# Patient Record
Sex: Female | Born: 1940 | Race: White | Hispanic: No | Marital: Married | Smoking: Former smoker
Health system: Southern US, Community
[De-identification: ages and names within clinical notes are randomized; demographics above are authoritative.]

## PROBLEM LIST (undated history)

## (undated) DIAGNOSIS — E7800 Pure hypercholesterolemia, unspecified: Secondary | ICD-10-CM

## (undated) DIAGNOSIS — Z720 Tobacco use: Secondary | ICD-10-CM

## (undated) DIAGNOSIS — C801 Malignant (primary) neoplasm, unspecified: Secondary | ICD-10-CM

## (undated) DIAGNOSIS — I2510 Atherosclerotic heart disease of native coronary artery without angina pectoris: Secondary | ICD-10-CM

## (undated) DIAGNOSIS — I714 Abdominal aortic aneurysm, without rupture, unspecified: Secondary | ICD-10-CM

## (undated) DIAGNOSIS — E039 Hypothyroidism, unspecified: Secondary | ICD-10-CM

## (undated) DIAGNOSIS — I739 Peripheral vascular disease, unspecified: Secondary | ICD-10-CM

## (undated) DIAGNOSIS — D649 Anemia, unspecified: Secondary | ICD-10-CM

## (undated) DIAGNOSIS — K219 Gastro-esophageal reflux disease without esophagitis: Secondary | ICD-10-CM

## (undated) DIAGNOSIS — C189 Malignant neoplasm of colon, unspecified: Secondary | ICD-10-CM

## (undated) DIAGNOSIS — C44311 Basal cell carcinoma of skin of nose: Secondary | ICD-10-CM

## (undated) DIAGNOSIS — I10 Essential (primary) hypertension: Secondary | ICD-10-CM

## (undated) DIAGNOSIS — M1990 Unspecified osteoarthritis, unspecified site: Secondary | ICD-10-CM

## (undated) DIAGNOSIS — R42 Dizziness and giddiness: Secondary | ICD-10-CM

## (undated) DIAGNOSIS — I252 Old myocardial infarction: Secondary | ICD-10-CM

## (undated) HISTORY — DX: Abdominal aortic aneurysm, without rupture: I71.4

## (undated) HISTORY — DX: Peripheral vascular disease, unspecified: I73.9

## (undated) HISTORY — PX: CORONARY STENT PLACEMENT: SHX1402

## (undated) HISTORY — PX: TONSILLECTOMY: SUR1361

## (undated) HISTORY — DX: Abdominal aortic aneurysm, without rupture, unspecified: I71.40

## (undated) HISTORY — PX: TUBAL LIGATION: SHX77

## (undated) HISTORY — PX: CARDIAC CATHETERIZATION: SHX172

## (undated) HISTORY — PX: EYE SURGERY: SHX253

## (undated) HISTORY — DX: Malignant neoplasm of colon, unspecified: C18.9

---

## 1998-11-23 DIAGNOSIS — I252 Old myocardial infarction: Secondary | ICD-10-CM

## 1998-11-23 HISTORY — DX: Old myocardial infarction: I25.2

## 1999-06-20 ENCOUNTER — Ambulatory Visit (HOSPITAL_COMMUNITY): Admission: RE | Admit: 1999-06-20 | Discharge: 1999-06-20 | Payer: Self-pay | Admitting: Cardiology

## 2000-03-13 ENCOUNTER — Encounter: Payer: Self-pay | Admitting: Emergency Medicine

## 2000-03-14 ENCOUNTER — Inpatient Hospital Stay (HOSPITAL_COMMUNITY): Admission: EM | Admit: 2000-03-14 | Discharge: 2000-03-16 | Payer: Self-pay | Admitting: Emergency Medicine

## 2000-08-18 ENCOUNTER — Ambulatory Visit (HOSPITAL_COMMUNITY): Admission: RE | Admit: 2000-08-18 | Discharge: 2000-08-19 | Payer: Self-pay | Admitting: Cardiology

## 2008-11-23 DIAGNOSIS — C44311 Basal cell carcinoma of skin of nose: Secondary | ICD-10-CM

## 2008-11-23 HISTORY — DX: Basal cell carcinoma of skin of nose: C44.311

## 2013-10-30 ENCOUNTER — Telehealth: Payer: Self-pay

## 2013-11-01 ENCOUNTER — Ambulatory Visit: Payer: Self-pay | Admitting: Cardiology

## 2013-11-02 ENCOUNTER — Other Ambulatory Visit: Payer: Self-pay | Admitting: Cardiology

## 2013-11-02 MED ORDER — METOPROLOL TARTRATE 25 MG PO TABS: 25.0000 mg | ORAL_TABLET | Freq: Two times a day (BID) | ORAL | Status: DC

## 2013-11-02 NOTE — Telephone Encounter (Signed)
Rx sent to pharmacy   

## 2013-11-08 ENCOUNTER — Ambulatory Visit: Payer: Medicare Other | Admitting: Cardiology

## 2013-11-21 ENCOUNTER — Telehealth: Payer: Self-pay

## 2013-11-22 NOTE — Telephone Encounter (Signed)
Pt needs an appt with Dr Marlou Porch before any refills pt has cancelled last two appts and is overdue

## 2014-03-07 ENCOUNTER — Telehealth: Payer: Self-pay

## 2014-03-07 MED ORDER — ROSUVASTATIN CALCIUM 20 MG PO TABS: 20.0000 mg | ORAL_TABLET | Freq: Every day | ORAL | Status: DC

## 2014-03-07 NOTE — Telephone Encounter (Signed)
Refilled

## 2014-03-07 NOTE — Addendum Note (Signed)
Addended byUlla Potash H on: 03/07/2014 11:34 AM   Modules accepted: Orders

## 2014-03-29 ENCOUNTER — Other Ambulatory Visit: Payer: Self-pay | Admitting: Cardiology

## 2014-03-29 MED ORDER — ESOMEPRAZOLE MAGNESIUM 40 MG PO CPDR: 40.0000 mg | DELAYED_RELEASE_CAPSULE | Freq: Every day | ORAL | Status: DC

## 2014-03-29 NOTE — Telephone Encounter (Signed)
Rx Refill Nexium

## 2014-04-06 ENCOUNTER — Encounter (HOSPITAL_COMMUNITY): Payer: Self-pay | Admitting: Emergency Medicine

## 2014-04-06 ENCOUNTER — Emergency Department (HOSPITAL_COMMUNITY)
Admission: EM | Admit: 2014-04-06 | Discharge: 2014-04-07 | Disposition: A | Discharge status: Home / Self Care | Payer: Medicare Other | Attending: Emergency Medicine | Admitting: Emergency Medicine

## 2014-04-06 ENCOUNTER — Emergency Department (HOSPITAL_COMMUNITY): Payer: Medicare Other

## 2014-04-06 DIAGNOSIS — Z792 Long term (current) use of antibiotics: Secondary | ICD-10-CM | POA: Insufficient documentation

## 2014-04-06 DIAGNOSIS — I10 Essential (primary) hypertension: Secondary | ICD-10-CM | POA: Insufficient documentation

## 2014-04-06 DIAGNOSIS — Z862 Personal history of diseases of the blood and blood-forming organs and certain disorders involving the immune mechanism: Secondary | ICD-10-CM | POA: Insufficient documentation

## 2014-04-06 DIAGNOSIS — Z8639 Personal history of other endocrine, nutritional and metabolic disease: Secondary | ICD-10-CM | POA: Insufficient documentation

## 2014-04-06 DIAGNOSIS — R509 Fever, unspecified: Secondary | ICD-10-CM | POA: Insufficient documentation

## 2014-04-06 DIAGNOSIS — I252 Old myocardial infarction: Secondary | ICD-10-CM | POA: Insufficient documentation

## 2014-04-06 DIAGNOSIS — R5381 Other malaise: Secondary | ICD-10-CM | POA: Insufficient documentation

## 2014-04-06 DIAGNOSIS — K219 Gastro-esophageal reflux disease without esophagitis: Secondary | ICD-10-CM | POA: Insufficient documentation

## 2014-04-06 DIAGNOSIS — Z7982 Long term (current) use of aspirin: Secondary | ICD-10-CM | POA: Insufficient documentation

## 2014-04-06 DIAGNOSIS — Z79899 Other long term (current) drug therapy: Secondary | ICD-10-CM | POA: Insufficient documentation

## 2014-04-06 DIAGNOSIS — Z9861 Coronary angioplasty status: Secondary | ICD-10-CM | POA: Insufficient documentation

## 2014-04-06 DIAGNOSIS — R1310 Dysphagia, unspecified: Secondary | ICD-10-CM | POA: Insufficient documentation

## 2014-04-06 DIAGNOSIS — R5383 Other fatigue: Secondary | ICD-10-CM | POA: Insufficient documentation

## 2014-04-06 HISTORY — DX: Old myocardial infarction: I25.2

## 2014-04-06 HISTORY — DX: Gastro-esophageal reflux disease without esophagitis: K21.9

## 2014-04-06 HISTORY — DX: Essential (primary) hypertension: I10

## 2014-04-06 LAB — BASIC METABOLIC PANEL
BUN: 14 mg/dL (ref 6–23)
CHLORIDE: 104 meq/L (ref 96–112)
CO2: 27 meq/L (ref 19–32)
CREATININE: 1.06 mg/dL (ref 0.50–1.10)
Calcium: 9.7 mg/dL (ref 8.4–10.5)
GFR calc Af Amer: 58 mL/min — ABNORMAL LOW (ref >90)
GFR calc non Af Amer: 50 mL/min — ABNORMAL LOW (ref >90)
GLUCOSE: 111 mg/dL — AB (ref 70–99)
Potassium: 3.7 mEq/L (ref 3.7–5.3)
Sodium: 143 mEq/L (ref 137–147)

## 2014-04-06 LAB — CBC WITH DIFFERENTIAL/PLATELET
Basophils Absolute: 0 10*3/uL (ref 0.0–0.1)
Basophils Relative: 0 % (ref 0–1)
Eosinophils Absolute: 0 10*3/uL (ref 0.0–0.7)
Eosinophils Relative: 0 % (ref 0–5)
HEMATOCRIT: 44.4 % (ref 36.0–46.0)
HEMOGLOBIN: 14.3 g/dL (ref 12.0–15.0)
Lymphocytes Relative: 19 % (ref 12–46)
Lymphs Abs: 1 10*3/uL (ref 0.7–4.0)
MCH: 28.8 pg (ref 26.0–34.0)
MCHC: 32.2 g/dL (ref 30.0–36.0)
MCV: 89.5 fL (ref 78.0–100.0)
MONO ABS: 0.3 10*3/uL (ref 0.1–1.0)
MONOS PCT: 7 % (ref 3–12)
NEUTROS ABS: 3.7 10*3/uL (ref 1.7–7.7)
Neutrophils Relative %: 74 % (ref 43–77)
Platelets: 174 10*3/uL (ref 150–400)
RBC: 4.96 MIL/uL (ref 3.87–5.11)
RDW: 14.6 % (ref 11.5–15.5)
WBC: 5.1 10*3/uL (ref 4.0–10.5)

## 2014-04-06 MED ORDER — MORPHINE SULFATE 4 MG/ML IJ SOLN
4.0000 mg | Freq: Once | INTRAMUSCULAR | Status: DC
  Filled 2014-04-06: qty 1

## 2014-04-06 MED ORDER — SODIUM CHLORIDE 0.9 % IV SOLN
1000.0000 mL | Freq: Once | INTRAVENOUS | Status: AC
  Administered 2014-04-07: 1000 mL via INTRAVENOUS

## 2014-04-06 MED ORDER — ONDANSETRON HCL 4 MG/2ML IJ SOLN
4.0000 mg | Freq: Once | INTRAMUSCULAR | Status: AC
  Administered 2014-04-07: 4 mg via INTRAMUSCULAR
  Filled 2014-04-06: qty 2

## 2014-04-06 NOTE — ED Provider Notes (Signed)
CSN: [id number]     Arrival date & time 04/06/14  [id number] History   First MD Initiated Contact with Patient 04/06/14 2303    This chart was scribed for Sharyon Cable, MD by Terressa Koyanagi, ED Scribe. This patient was seen in room APA10/APA10 and the patient's care was started at 11:05 PM.  Chief Complaint  Patient presents with  . Sore Throat   The history is provided by the patient. No language interpreter was used.   HPI Comments: Jessica Taylor is a 73 y.o. female who presents to the Emergency Department complaining of a sore throat with associated low grade fever and fatigue onset 2 days ago. Pt also reports that she had a root canal 3 days ago and the sore throat started following the root canal. Pt reports she was placed on Amoxicillin following the root canal and has been compliant with the meds. Pt denies chest pain, SOB, weakness/numbness of extremities, abd pain, or swelling of the lower extremities.   Past Medical History  Diagnosis Date  . MI, old   . Hypertension   . Acid reflux   . Thyroid disease    Past Surgical History  Procedure Laterality Date  . Coronary stent placement     No family history on file. History  Substance Use Topics  . Smoking status: Never Smoker   . Smokeless tobacco: Not on file  . Alcohol Use: No   OB History   Grav Para Term Preterm Abortions TAB SAB Ect Mult Living                 Review of Systems  Constitutional: Positive for fever.  HENT: Positive for sore throat.   Respiratory: Negative for shortness of breath.   Cardiovascular: Negative for chest pain and leg swelling.  Gastrointestinal: Negative for abdominal pain.  Neurological: Negative for weakness and numbness.  All other systems reviewed and are negative.     Allergies  Review of patient's allergies indicates no known allergies.  Home Medications   Prior to Admission medications   Medication Sig Start Date End Date Taking? Authorizing Provider  amoxicillin  (AMOXIL) 500 MG capsule Take 500 mg by mouth 4 (four) times daily.   Yes Historical Provider, MD  aspirin EC 81 MG tablet Take 81 mg by mouth daily.   Yes Historical Provider, MD  Boswellia-Glucosamine-Vit D (GLUCOSAMINE COMPLEX PO) Take 1 capsule by mouth daily.   Yes Historical Provider, MD  esomeprazole (NEXIUM) 40 MG capsule Take 1 capsule (40 mg total) by mouth daily at 12 noon. 03/29/14  Yes Candee Furbish, MD  Fish Oil-Cholecalciferol (FISH OIL + D3 PO) Take 1 capsule by mouth daily.   Yes Historical Provider, MD  metoprolol tartrate (LOPRESSOR) 25 MG tablet Take 1 tablet (25 mg total) by mouth 2 (two) times daily. 11/02/13  Yes Candee Furbish, MD  rosuvastatin (CRESTOR) 20 MG tablet Take 1 tablet (20 mg total) by mouth daily. 03/07/14  Yes Candee Furbish, MD   Triage Vitals: BP 150/70  Pulse 101  Temp(Src) 99.9 F (37.7 C) (Oral)  Resp 20  Ht 5\' 4"  (1.626 m)  Wt 155 lb (70.308 kg)  BMI 26.59 kg/m2  SpO2 95% Physical Exam CONSTITUTIONAL: Well developed/well nourished HEAD: Normocephalic/atraumatic EYES: EOMI/PERRL ENMT: Mucous membranes moist, no stridor, no trismus, left lower molar tender to palpation but no abscess. Uvula midline without exudates.  Normal phonation NECK: supple no meningeal signs, cervical lymphadenopathy. SPINE:entire spine nontender CV: S1/S2 noted, no murmurs/rubs/gallops noted  LUNGS: Lungs are clear to auscultation bilaterally, no apparent distress ABDOMEN: soft, nontender, no rebound or guarding GU:no cva tenderness NEURO: Pt is awake/alert, moves all extremitiesx4 EXTREMITIES: pulses normal, full ROM SKIN: warm, color normal PSYCH: no abnormalities of mood noted  ED Course  Procedures DIAGNOSTIC STUDIES: Oxygen Saturation is 95% on RA, adequate by my interpretation.    COORDINATION OF CARE:  11:08 PM-Discussed treatment plan which includes imaging with pt at bedside and pt agreed to plan. Also offered pain meds to pt, pt declined pain meds.   1:14 AM Ct  negative She is in no distress She requests pain meds (will give elixir) and requests another round of amoxicillin (finishes initial course later today) No post operative complications noted Stable for d/c home  Labs Review Labs Reviewed  BASIC METABOLIC PANEL - Abnormal; Notable for the following:    Glucose, Bld 111 (*)    GFR calc non Af Amer 50 (*)    GFR calc Af Amer 58 (*)    All other components within normal limits  CBC WITH DIFFERENTIAL    Imaging Review Ct Soft Tissue Neck W Contrast  04/07/2014   CLINICAL DATA:  Sore throat, low grade fever and fatigue. Recent root canal.  EXAM: CT NECK WITH CONTRAST  TECHNIQUE: Multidetector CT imaging of the neck was performed using the standard protocol following the bolus administration of intravenous contrast.  CONTRAST:  55mL OMNIPAQUE IOHEXOL 300 MG/ML  SOLN  COMPARISON:  None.  FINDINGS: Aerodigestive tract is unremarkable, atrophic appearance of the palatine tonsils may reflect prior tonsillectomy. Preservation parapharyngeal fat tissue planes. Larynx is unremarkable. No free fluid or fluid collections within the neck.  Normal appearance of the major salivary glands. Normal thyroid gland. No lymphadenopathy by CT size criteria.  Moderate calcific atherosclerosis of the carotid bifurcations. Straightened cervical lordosis. Bilateral maxillary mucosal retention cysts without paranasal sinus air-fluid levels. The mastoid air cells appear well-aerated. Tooth 2 empty socket could reflect recent extraction, no periapical abscess.  IMPRESSION: No acute process within the neck, specifically no facial abscess or acute dental pathology by CT.   Electronically Signed   By: Elon Alas   On: 04/07/2014 00:57      MDM   Final diagnoses:  Odynophagia    Nursing notes including past medical history and social history reviewed and considered in documentation Labs/vital reviewed and considered  I personally performed the services described in  this documentation, which was scribed in my presence. The recorded information has been reviewed and is accurate.       Sharyon Cable, MD 04/07/14 [phone]

## 2014-04-06 NOTE — ED Notes (Signed)
Pt's daughter is angry about the wait to be seen and requesting to speak to ED director. Agricultural consultant notified.

## 2014-04-06 NOTE — ED Notes (Signed)
Sore throat x 2 days.  Root canal x 3 days ago.

## 2014-04-07 MED ORDER — AMOXICILLIN 250 MG/5ML PO SUSR: 500.0000 mg | Freq: Three times a day (TID) | ORAL | Status: DC

## 2014-04-07 MED ORDER — HYDROCODONE-ACETAMINOPHEN 7.5-325 MG/15ML PO SOLN
10.0000 mL | Freq: Once | ORAL | Status: AC
  Administered 2014-04-07: 10 mL via ORAL
  Filled 2014-04-07: qty 15

## 2014-04-07 MED ORDER — IOHEXOL 300 MG/ML  SOLN
80.0000 mL | Freq: Once | INTRAMUSCULAR | Status: AC | PRN
  Administered 2014-04-07: 80 mL via INTRAVENOUS

## 2014-04-07 MED ORDER — HYDROCODONE-ACETAMINOPHEN 7.5-325 MG/15ML PO SOLN: ORAL | Status: DC

## 2014-04-07 NOTE — ED Notes (Addendum)
Pts daughter requesting "whoever is over this departments" telephone number. Daughter may be upset because of wait time but most our pts on 04-06-14 had a 3 1/2 hr wait. Pts were told in triage there would be a wait but we would get them back as soon as possible. Daughter given Sharla Kidney #. Dr. Christy Gentles aware of request.

## 2014-04-24 ENCOUNTER — Other Ambulatory Visit: Payer: Self-pay | Admitting: *Deleted

## 2014-04-24 DIAGNOSIS — E7800 Pure hypercholesterolemia, unspecified: Secondary | ICD-10-CM

## 2014-04-24 DIAGNOSIS — Z79899 Other long term (current) drug therapy: Secondary | ICD-10-CM

## 2014-05-11 ENCOUNTER — Encounter (HOSPITAL_COMMUNITY): Payer: Self-pay | Admitting: Pharmacy Technician

## 2014-05-14 ENCOUNTER — Ambulatory Visit: Payer: Self-pay | Admitting: Cardiology

## 2014-05-18 ENCOUNTER — Encounter (HOSPITAL_COMMUNITY): Payer: Self-pay

## 2014-05-18 ENCOUNTER — Encounter (HOSPITAL_COMMUNITY)
Admission: RE | Admit: 2014-05-18 | Discharge: 2014-05-18 | Disposition: A | Discharge status: Home / Self Care | Payer: Medicare Other | Source: Ambulatory Visit | Attending: Ophthalmology | Admitting: Ophthalmology

## 2014-05-18 ENCOUNTER — Encounter (HOSPITAL_COMMUNITY): Payer: Self-pay | Admitting: Pharmacy Technician

## 2014-05-18 ENCOUNTER — Other Ambulatory Visit: Payer: Self-pay

## 2014-05-18 DIAGNOSIS — Z01810 Encounter for preprocedural cardiovascular examination: Secondary | ICD-10-CM | POA: Insufficient documentation

## 2014-05-18 DIAGNOSIS — Z01812 Encounter for preprocedural laboratory examination: Secondary | ICD-10-CM | POA: Insufficient documentation

## 2014-05-18 HISTORY — DX: Dizziness and giddiness: R42

## 2014-05-18 HISTORY — DX: Hypothyroidism, unspecified: E03.9

## 2014-05-18 HISTORY — DX: Unspecified osteoarthritis, unspecified site: M19.90

## 2014-05-18 HISTORY — DX: Pure hypercholesterolemia, unspecified: E78.00

## 2014-05-18 NOTE — Pre-Procedure Instructions (Signed)
Patient given information to sign up for my chart at home. 

## 2014-05-18 NOTE — Patient Instructions (Signed)
Your procedure is scheduled on: 05/24/2014  Report to [facility] at  1  PM.  Call this number if you have problems the morning of surgery: [phone]   Do not eat food or drink liquids :After Midnight.      Take these medicines the morning of surgery with A SIP OF WATER: nexium, metoprolol   Do not wear jewelry, make-up or nail polish.  Do not wear lotions, powders, or perfumes.   Do not shave 48 hours prior to surgery.  Do not bring valuables to the hospital.  Contacts, dentures or bridgework may not be worn into surgery.  Leave suitcase in the car. After surgery it may be brought to your room.  For patients admitted to the hospital, checkout time is 11:00 AM the day of discharge.   Patients discharged the day of surgery will not be allowed to drive home.  :     Please read over the following fact sheets that you were given: Coughing and Deep Breathing, Surgical Site Infection Prevention, Anesthesia Post-op Instructions and Care and Recovery After Surgery    Cataract A cataract is a clouding of the lens of the eye. When a lens becomes cloudy, vision is reduced based on the degree and nature of the clouding. Many cataracts reduce vision to some degree. Some cataracts make people more near-sighted as they develop. Other cataracts increase glare. Cataracts that are ignored and become worse can sometimes look white. The white color can be seen through the pupil. CAUSES   Aging. However, cataracts may occur at any age, even in newborns.   Certain drugs.   Trauma to the eye.   Certain diseases such as diabetes.   Specific eye diseases such as chronic inflammation inside the eye or a sudden attack of a rare form of glaucoma.   Inherited or acquired medical problems.  SYMPTOMS   Gradual, progressive drop in vision in the affected eye.   Severe, rapid visual loss. This most often happens when trauma is the cause.  DIAGNOSIS  To detect a cataract, an eye doctor examines the lens.  Cataracts are best diagnosed with an exam of the eyes with the pupils enlarged (dilated) by drops.  TREATMENT  For an early cataract, vision may improve by using different eyeglasses or stronger lighting. If that does not help your vision, surgery is the only effective treatment. A cataract needs to be surgically removed when vision loss interferes with your everyday activities, such as driving, reading, or watching TV. A cataract may also have to be removed if it prevents examination or treatment of another eye problem. Surgery removes the cloudy lens and usually replaces it with a substitute lens (intraocular lens, IOL).  At a time when both you and your doctor agree, the cataract will be surgically removed. If you have cataracts in both eyes, only one is usually removed at a time. This allows the operated eye to heal and be out of danger from any possible problems after surgery (such as infection or poor wound healing). In rare cases, a cataract may be doing damage to your eye. In these cases, your caregiver may advise surgical removal right away. The vast majority of people who have cataract surgery have better vision afterward. HOME CARE INSTRUCTIONS  If you are not planning surgery, you may be asked to do the following:  Use different eyeglasses.   Use stronger or brighter lighting.   Ask your eye doctor about reducing your medicine dose or changing medicines  if it is thought that a medicine caused your cataract. Changing medicines does not make the cataract go away on its own.   Become familiar with your surroundings. Poor vision can lead to injury. Avoid bumping into things on the affected side. You are at a higher risk for tripping or falling.   Exercise extreme care when driving or operating machinery.   Wear sunglasses if you are sensitive to bright light or experiencing problems with glare.  SEEK IMMEDIATE MEDICAL CARE IF:   You have a worsening or sudden vision loss.   You notice  redness, swelling, or increasing pain in the eye.   You have a fever.  Document Released: 11/09/2005 Document Revised: 10/29/2011 Document Reviewed: 07/03/2011 [facility] Patient Information 2012 [place].PATIENT INSTRUCTIONS POST-ANESTHESIA  IMMEDIATELY FOLLOWING SURGERY:  Do not drive or operate machinery for the first twenty four hours after surgery.  Do not make any important decisions for twenty four hours after surgery or while taking narcotic pain medications or sedatives.  If you develop intractable nausea and vomiting or a severe headache please notify your doctor immediately.  FOLLOW-UP:  Please make an appointment with your surgeon as instructed. You do not need to follow up with anesthesia unless specifically instructed to do so.  WOUND CARE INSTRUCTIONS (if applicable):  Keep a dry clean dressing on the anesthesia/puncture wound site if there is drainage.  Once the wound has quit draining you may leave it open to air.  Generally you should leave the bandage intact for twenty four hours unless there is drainage.  If the epidural site drains for more than 36-48 hours please call the anesthesia department.  QUESTIONS?:  Please feel free to call your physician or the hospital operator if you have any questions, and they will be happy to assist you.

## 2014-05-23 ENCOUNTER — Other Ambulatory Visit (INDEPENDENT_AMBULATORY_CARE_PROVIDER_SITE_OTHER): Payer: Medicare Other

## 2014-05-23 DIAGNOSIS — E7800 Pure hypercholesterolemia, unspecified: Secondary | ICD-10-CM

## 2014-05-23 DIAGNOSIS — Z79899 Other long term (current) drug therapy: Secondary | ICD-10-CM

## 2014-05-23 LAB — HEPATIC FUNCTION PANEL
ALT: 24 U/L (ref 0–35)
AST: 25 U/L (ref 0–37)
Albumin: 4.2 g/dL (ref 3.5–5.2)
Alkaline Phosphatase: 75 U/L (ref 39–117)
BILIRUBIN TOTAL: 0.8 mg/dL (ref 0.2–1.2)
Bilirubin, Direct: 0 mg/dL (ref 0.0–0.3)
Total Protein: 7.1 g/dL (ref 6.0–8.3)

## 2014-05-23 LAB — LIPID PANEL
CHOL/HDL RATIO: 5
Cholesterol: 191 mg/dL (ref 0–200)
HDL: 40.6 mg/dL (ref >39.00)
LDL CALC: 105 mg/dL — AB (ref 0–99)
NONHDL: 150.4
Triglycerides: 226 mg/dL — ABNORMAL HIGH (ref 0.0–149.0)
VLDL: 45.2 mg/dL — AB (ref 0.0–40.0)

## 2014-05-24 ENCOUNTER — Encounter (HOSPITAL_COMMUNITY): Payer: Self-pay | Admitting: *Deleted

## 2014-05-24 ENCOUNTER — Encounter (HOSPITAL_COMMUNITY): Payer: Medicare Other | Admitting: Anesthesiology

## 2014-05-24 ENCOUNTER — Ambulatory Visit (HOSPITAL_COMMUNITY): Payer: Medicare Other | Admitting: Anesthesiology

## 2014-05-24 ENCOUNTER — Encounter (HOSPITAL_COMMUNITY)
Admission: RE | Disposition: A | Discharge status: Home / Self Care | Payer: Self-pay | Source: Ambulatory Visit | Attending: Ophthalmology

## 2014-05-24 ENCOUNTER — Ambulatory Visit (HOSPITAL_COMMUNITY)
Admission: RE | Admit: 2014-05-24 | Discharge: 2014-05-24 | Disposition: A | Discharge status: Home / Self Care | Payer: Medicare Other | Source: Ambulatory Visit | Attending: Ophthalmology | Admitting: Ophthalmology

## 2014-05-24 DIAGNOSIS — I2510 Atherosclerotic heart disease of native coronary artery without angina pectoris: Secondary | ICD-10-CM | POA: Insufficient documentation

## 2014-05-24 DIAGNOSIS — K219 Gastro-esophageal reflux disease without esophagitis: Secondary | ICD-10-CM | POA: Insufficient documentation

## 2014-05-24 DIAGNOSIS — Z79899 Other long term (current) drug therapy: Secondary | ICD-10-CM | POA: Insufficient documentation

## 2014-05-24 DIAGNOSIS — I10 Essential (primary) hypertension: Secondary | ICD-10-CM | POA: Insufficient documentation

## 2014-05-24 DIAGNOSIS — Z7982 Long term (current) use of aspirin: Secondary | ICD-10-CM | POA: Insufficient documentation

## 2014-05-24 DIAGNOSIS — Z87891 Personal history of nicotine dependence: Secondary | ICD-10-CM | POA: Insufficient documentation

## 2014-05-24 DIAGNOSIS — H269 Unspecified cataract: Secondary | ICD-10-CM | POA: Insufficient documentation

## 2014-05-24 HISTORY — PX: CATARACT EXTRACTION W/PHACO: SHX586

## 2014-05-24 SURGERY — PHACOEMULSIFICATION, CATARACT, WITH IOL INSERTION
Anesthesia: Monitor Anesthesia Care | Site: Eye | Laterality: Left

## 2014-05-24 MED ORDER — FENTANYL CITRATE 0.05 MG/ML IJ SOLN: 25.0000 ug | INTRAMUSCULAR | Status: DC | PRN

## 2014-05-24 MED ORDER — LIDOCAINE HCL (PF) 1 % IJ SOLN
INTRAMUSCULAR | Status: AC
  Filled 2014-05-24: qty 2

## 2014-05-24 MED ORDER — ONDANSETRON HCL 4 MG PO TABS
4.0000 mg | ORAL_TABLET | Freq: Once | ORAL | Status: DC
  Filled 2014-05-24: qty 1

## 2014-05-24 MED ORDER — FENTANYL CITRATE 0.05 MG/ML IJ SOLN
25.0000 ug | INTRAMUSCULAR | Status: AC
  Administered 2014-05-24 (×2): 25 ug via INTRAVENOUS

## 2014-05-24 MED ORDER — ONDANSETRON 4 MG PO TBDP
ORAL_TABLET | ORAL | Status: AC
  Filled 2014-05-24: qty 1

## 2014-05-24 MED ORDER — CYCLOPENTOLATE-PHENYLEPHRINE OP SOLN OPTIME - NO CHARGE
OPHTHALMIC | Status: AC
  Filled 2014-05-24: qty 2

## 2014-05-24 MED ORDER — CYCLOPENTOLATE-PHENYLEPHRINE 0.2-1 % OP SOLN
1.0000 [drp] | OPHTHALMIC | Status: AC
  Administered 2014-05-24 (×3): 1 [drp] via OPHTHALMIC

## 2014-05-24 MED ORDER — MIDAZOLAM HCL 2 MG/2ML IJ SOLN
INTRAMUSCULAR | Status: AC
  Filled 2014-05-24: qty 2

## 2014-05-24 MED ORDER — LIDOCAINE HCL 3.5 % OP GEL
OPHTHALMIC | Status: AC
  Filled 2014-05-24: qty 1

## 2014-05-24 MED ORDER — MIDAZOLAM HCL 2 MG/2ML IJ SOLN
1.0000 mg | INTRAMUSCULAR | Status: DC | PRN
  Administered 2014-05-24: 2 mg via INTRAVENOUS

## 2014-05-24 MED ORDER — POVIDONE-IODINE 5 % OP SOLN
OPHTHALMIC | Status: DC | PRN
  Administered 2014-05-24: 1 via OPHTHALMIC

## 2014-05-24 MED ORDER — EPINEPHRINE HCL 1 MG/ML IJ SOLN
INTRAOCULAR | Status: DC | PRN
  Administered 2014-05-24: 14:00:00

## 2014-05-24 MED ORDER — PHENYLEPHRINE HCL 2.5 % OP SOLN
1.0000 [drp] | OPHTHALMIC | Status: AC
  Administered 2014-05-24 (×3): 1 [drp] via OPHTHALMIC

## 2014-05-24 MED ORDER — LACTATED RINGERS IV SOLN
INTRAVENOUS | Status: DC
Start: 2014-05-24 — End: 2014-05-24
  Administered 2014-05-24: 13:00:00 via INTRAVENOUS

## 2014-05-24 MED ORDER — EPINEPHRINE HCL 1 MG/ML IJ SOLN
INTRAMUSCULAR | Status: AC
  Filled 2014-05-24: qty 1

## 2014-05-24 MED ORDER — PHENYLEPHRINE HCL 2.5 % OP SOLN
OPHTHALMIC | Status: AC
  Filled 2014-05-24: qty 15

## 2014-05-24 MED ORDER — LIDOCAINE HCL 3.5 % OP GEL
1.0000 | Freq: Once | OPHTHALMIC | Status: AC
Start: 2014-05-24 — End: 2014-05-24
  Administered 2014-05-24: 1 via OPHTHALMIC

## 2014-05-24 MED ORDER — FENTANYL CITRATE 0.05 MG/ML IJ SOLN
INTRAMUSCULAR | Status: AC
  Filled 2014-05-24: qty 2

## 2014-05-24 MED ORDER — NEOMYCIN-POLYMYXIN-DEXAMETH 3.5-10000-0.1 OP SUSP
OPHTHALMIC | Status: DC | PRN
  Administered 2014-05-24: 2 [drp] via OPHTHALMIC

## 2014-05-24 MED ORDER — BSS IO SOLN
INTRAOCULAR | Status: DC | PRN
  Administered 2014-05-24: 15 mL via INTRAOCULAR

## 2014-05-24 MED ORDER — ONDANSETRON HCL 4 MG/2ML IJ SOLN: 4.0000 mg | Freq: Once | INTRAMUSCULAR | Status: DC | PRN

## 2014-05-24 MED ORDER — LIDOCAINE 3.5 % OP GEL OPTIME - NO CHARGE
OPHTHALMIC | Status: DC | PRN
  Administered 2014-05-24: 2 [drp] via OPHTHALMIC

## 2014-05-24 MED ORDER — NEOMYCIN-POLYMYXIN-DEXAMETH 3.5-10000-0.1 OP SUSP
OPHTHALMIC | Status: AC
  Filled 2014-05-24: qty 5

## 2014-05-24 MED ORDER — LIDOCAINE HCL (PF) 1 % IJ SOLN
INTRAMUSCULAR | Status: DC | PRN
  Administered 2014-05-24: .5 mL

## 2014-05-24 MED ORDER — TETRACAINE HCL 0.5 % OP SOLN
1.0000 [drp] | OPHTHALMIC | Status: AC
  Administered 2014-05-24 (×3): 1 [drp] via OPHTHALMIC

## 2014-05-24 MED ORDER — PROVISC 10 MG/ML IO SOLN
INTRAOCULAR | Status: DC | PRN
  Administered 2014-05-24: 0.85 mL via INTRAOCULAR

## 2014-05-24 MED ORDER — TETRACAINE HCL 0.5 % OP SOLN
OPHTHALMIC | Status: AC
  Filled 2014-05-24: qty 2

## 2014-05-24 SURGICAL SUPPLY — 32 items
CAPSULAR TENSION RING-AMO (OPHTHALMIC RELATED) IMPLANT
CLOTH BEACON ORANGE TIMEOUT ST (SAFETY) ×1 IMPLANT
EYE SHIELD UNIVERSAL CLEAR (GAUZE/BANDAGES/DRESSINGS) ×1 IMPLANT
GLOVE BIO SURGEON STRL SZ 6.5 (GLOVE) IMPLANT
GLOVE BIOGEL PI IND STRL 6.5 (GLOVE) IMPLANT
GLOVE BIOGEL PI IND STRL 7.0 (GLOVE) IMPLANT
GLOVE BIOGEL PI IND STRL 7.5 (GLOVE) IMPLANT
GLOVE BIOGEL PI INDICATOR 6.5 (GLOVE) ×1
GLOVE BIOGEL PI INDICATOR 7.0 (GLOVE)
GLOVE BIOGEL PI INDICATOR 7.5 (GLOVE)
GLOVE ECLIPSE 6.5 STRL STRAW (GLOVE) IMPLANT
GLOVE ECLIPSE 7.0 STRL STRAW (GLOVE) IMPLANT
GLOVE ECLIPSE 7.5 STRL STRAW (GLOVE) IMPLANT
GLOVE EXAM NITRILE LRG STRL (GLOVE) IMPLANT
GLOVE EXAM NITRILE MD LF STRL (GLOVE) IMPLANT
GLOVE SKINSENSE NS SZ6.5 (GLOVE)
GLOVE SKINSENSE NS SZ7.0 (GLOVE) ×1
GLOVE SKINSENSE STRL SZ6.5 (GLOVE) IMPLANT
GLOVE SKINSENSE STRL SZ7.0 (GLOVE) IMPLANT
KIT VITRECTOMY (OPHTHALMIC RELATED) IMPLANT
PAD ARMBOARD 7.5X6 YLW CONV (MISCELLANEOUS) ×1 IMPLANT
PROC W NO LENS (INTRAOCULAR LENS)
PROC W SPEC LENS (INTRAOCULAR LENS)
PROCESS W NO LENS (INTRAOCULAR LENS) IMPLANT
PROCESS W SPEC LENS (INTRAOCULAR LENS) IMPLANT
RING MALYGIN (MISCELLANEOUS) IMPLANT
SIGHTPATH CAT PROC W REG LENS (Ophthalmic Related) ×2 IMPLANT
SYR TB 1ML LL NO SAFETY (SYRINGE) ×1 IMPLANT
TAPE SURG TRANSPORE 1 IN (GAUZE/BANDAGES/DRESSINGS) IMPLANT
TAPE SURGICAL TRANSPORE 1 IN (GAUZE/BANDAGES/DRESSINGS) ×1
VISCOELASTIC ADDITIONAL (OPHTHALMIC RELATED) IMPLANT
WATER STERILE IRR 250ML POUR (IV SOLUTION) ×1 IMPLANT

## 2014-05-24 NOTE — Progress Notes (Signed)
C/o slight nausea. States she thinks it is from her chronic vertigo. Dr Geoffry Paradise notified. Med ordered and given.

## 2014-05-24 NOTE — Anesthesia Postprocedure Evaluation (Signed)
  Anesthesia Post-op Note  Patient: Jessica Taylor  Procedure(s) Performed: Procedure(s) with comments: CATARACT EXTRACTION PHACO AND INTRAOCULAR LENS PLACEMENT (IOC) (Left) - CDE:  9.30  Patient Location: Short Stay  Anesthesia Type:MAC  Level of Consciousness: awake, alert , oriented and patient cooperative  Airway and Oxygen Therapy: Patient Spontanous Breathing  Post-op Pain: none  Post-op Assessment: Post-op Vital signs reviewed, Patient's Cardiovascular Status Stable, Respiratory Function Stable, Patent Airway and Pain level controlled  Post-op Vital Signs: Reviewed and stable  Last Vitals:  Filed Vitals:   05/24/14 1340  BP: 108/55  Temp:   Resp: 16    Complications: No apparent anesthesia complications

## 2014-05-24 NOTE — Op Note (Signed)
Date of Admission: 05/24/2014  Date of Surgery: 05/24/2014   Pre-Op Dx: Cataract Left Eye  Post-Op Dx: Cataract Left  Eye,  Dx Code 366.16  Surgeon: Tonny Branch, M.D.  Assistants: None  Anesthesia: Topical with MAC  Indications: Painless, progressive loss of vision with compromise of daily activities.  Surgery: Cataract Extraction with Intraocular lens Implant Left Eye  Discription: The patient had dilating drops and viscous lidocaine placed into the Left eye in the pre-op holding area. After transfer to the operating room, a time out was performed. The patient was then prepped and draped. Beginning with a 39 degree blade a paracentesis port was made at the surgeon's 2 o'clock position. The anterior chamber was then filled with 1% non-preserved lidocaine. This was followed by filling the anterior chamber with Provisc.  A 2.41mm keratome blade was used to make a clear corneal incision at the temporal limbus.  A bent cystatome needle was used to create a continuous tear capsulotomy. Hydrodissection was performed with balanced salt solution on a Fine canula. The lens nucleus was then removed using the phacoemulsification handpiece. Residual cortex was removed with the I&A handpiece. The anterior chamber and capsular bag were refilled with Provisc. A posterior chamber intraocular lens was placed into the capsular bag with it's injector. The implant was positioned with the Kuglan hook. The Provisc was then removed from the anterior chamber and capsular bag with the I&A handpiece. Stromal hydration of the main incision and paracentesis port was performed with BSS on a Fine canula. The wounds were tested for leak which was negative. The patient tolerated the procedure well. There were no operative complications. The patient was then transferred to the recovery room in stable condition.  Complications: None  Specimen: None  EBL: None  Prosthetic device: Hoya iSert 250, power 13.5 D, SN [id number].

## 2014-05-24 NOTE — H&P (Signed)
I have reviewed the H&P, the patient was re-examined, and I have identified no interval changes in medical condition and plan of care since the history and physical of record  

## 2014-05-24 NOTE — Transfer of Care (Signed)
Immediate Anesthesia Transfer of Care Note  Patient: Jessica Taylor  Procedure(s) Performed: Procedure(s) with comments: CATARACT EXTRACTION PHACO AND INTRAOCULAR LENS PLACEMENT (IOC) (Left) - CDE:  9.30  Patient Location: Short Stay  Anesthesia Type:MAC  Level of Consciousness: awake, alert , oriented and patient cooperative  Airway & Oxygen Therapy: Patient Spontanous Breathing  Post-op Assessment: Report given to PACU RN, Post -op Vital signs reviewed and stable and Patient moving all extremities  Post vital signs: Reviewed and stable  Complications: No apparent anesthesia complications

## 2014-05-24 NOTE — Anesthesia Preprocedure Evaluation (Addendum)
Anesthesia Evaluation  Patient identified by MRN, date of birth, ID band Patient awake    Reviewed: Allergy & Precautions, H&P , NPO status , Patient's Chart, lab work & pertinent test results, reviewed documented beta blocker date and time   Airway Mallampati: II TM Distance: >3 FB     Dental  (+) Teeth Intact   Pulmonary former smoker,  breath sounds clear to auscultation        Cardiovascular hypertension, Pt. on medications and Pt. on home beta blockers + CAD and + Past MI Rhythm:Regular Rate:Normal     Neuro/Psych Vertigo     GI/Hepatic GERD-  Medicated and Controlled,  Endo/Other  Hypothyroidism   Renal/GU      Musculoskeletal   Abdominal   Peds  Hematology   Anesthesia Other Findings   Reproductive/Obstetrics                          Anesthesia Physical Anesthesia Plan  ASA: III  Anesthesia Plan: MAC   Post-op Pain Management:    Induction: Intravenous  Airway Management Planned: Nasal Cannula  Additional Equipment:   Intra-op Plan:   Post-operative Plan:   Informed Consent: I have reviewed the patients History and Physical, chart, labs and discussed the procedure including the risks, benefits and alternatives for the proposed anesthesia with the patient or authorized representative who has indicated his/her understanding and acceptance.     Plan Discussed with:   Anesthesia Plan Comments:         Anesthesia Quick Evaluation

## 2014-05-28 ENCOUNTER — Encounter (HOSPITAL_COMMUNITY): Payer: Self-pay | Admitting: Ophthalmology

## 2014-06-06 ENCOUNTER — Encounter: Payer: Self-pay | Admitting: Cardiology

## 2014-06-06 ENCOUNTER — Ambulatory Visit (INDEPENDENT_AMBULATORY_CARE_PROVIDER_SITE_OTHER): Payer: Medicare Other | Admitting: Cardiology

## 2014-06-06 VITALS — BP 140/74 | HR 60 | Ht 64.0 in | Wt 162.0 lb

## 2014-06-06 DIAGNOSIS — I2510 Atherosclerotic heart disease of native coronary artery without angina pectoris: Secondary | ICD-10-CM

## 2014-06-06 DIAGNOSIS — E782 Mixed hyperlipidemia: Secondary | ICD-10-CM

## 2014-06-06 DIAGNOSIS — I10 Essential (primary) hypertension: Secondary | ICD-10-CM | POA: Insufficient documentation

## 2014-06-06 DIAGNOSIS — I252 Old myocardial infarction: Secondary | ICD-10-CM

## 2014-06-06 NOTE — Progress Notes (Signed)
[facility]. [address] [place], [place]  [address] Phone: [phone] Fax:  [phone]  Date:  06/06/2014   ID:  Jessica Taylor, DOB Mar 28, 1941, MRN [id number]  PCP:  [name], MD   History of Present Illness: Jessica Taylor is a 73 y.o. female with a hx of MI and multiple stents, between 38 and 2001.   A stress test was performed on 12/23/10 which showed an mid lateral wall infarction with a very mild degree of peri-infarct ischemia. Normal EF. Compared to 2010 there was no significant change.  Since then she's been doing very well without any chest discomfort. No significant shortness of breath. She does admit now that she is now taking the Crestor. Compliance has been an issue because of cost. .  Pt request to have her lipids drawn that she was not taking therapy with last check and is on medicaitons now as prescribed.    Wt Readings from Last 3 Encounters:  06/06/14 162 lb (73.483 kg)  05/24/14 162 lb 12 oz (73.823 kg)  05/24/14 162 lb 12 oz (73.823 kg)     Past Medical History  Diagnosis Date  . Hypertension   . Acid reflux   . Thyroid disease   . MI, old 61  . Hypercholesteremia   . Hypothyroidism   . Arthritis   . Vertigo     when lays on left side.    Past Surgical History  Procedure Laterality Date  . Coronary stent placement    . Cardiac catheterization  5 stents  . Tubal ligation    . Cataract extraction w/phaco Left 05/24/2014    Procedure: CATARACT EXTRACTION PHACO AND INTRAOCULAR LENS PLACEMENT (IOC);  Surgeon: Tonny Branch, MD;  Location: [facility];  Service: Ophthalmology;  Laterality: Left;  CDE:  9.30    Current Outpatient Prescriptions  Medication Sig Dispense Refill  . amLODipine (NORVASC) 10 MG tablet Take 10 mg by mouth daily.      Marland Kitchen aspirin EC 81 MG tablet Take 81 mg by mouth daily.      Marland Kitchen BESIVANCE 0.6 % SUSP       . Boswellia-Glucosamine-Vit D (GLUCOSAMINE COMPLEX PO) Take 1 capsule by mouth daily.      . DUREZOL 0.05 %  EMUL       . esomeprazole (NEXIUM) 40 MG capsule Take 1 capsule (40 mg total) by mouth daily at 12 noon.  30 capsule  1  . Fish Oil-Cholecalciferol (FISH OIL + D3 PO) Take 1 capsule by mouth daily.      Marland Kitchen HYDROcodone-acetaminophen (NORCO/VICODIN) 5-325 MG per tablet       . levothyroxine (SYNTHROID, LEVOTHROID) 50 MCG tablet Take 50 mcg by mouth daily before breakfast.      . metoprolol tartrate (LOPRESSOR) 25 MG tablet Take 25 mg by mouth daily.      Marland Kitchen PROLENSA 0.07 % SOLN       . rosuvastatin (CRESTOR) 20 MG tablet Take 20 mg by mouth daily. Pt taking 1 tablet every other day       No current facility-administered medications for this visit.    Allergies:    Allergies  Allergen Reactions  . Shrimp [Shellfish Allergy]     Throws up violently    Social History:  The patient  reports that she quit smoking about 15 years ago. Her smoking use included Cigarettes. She has a 63 pack-year smoking history. She does not have any smokeless tobacco history on  file. She reports that she does not drink alcohol or use illicit drugs.   Family History  Problem Relation Age of Onset  . Cancer Mother 12  . Cancer Father 58    ROS:  Please see the history of present illness.   Denies any fevers, chills, or, PND All other systems reviewed and negative.   PHYSICAL EXAM: VS:  BP 140/74  Pulse 60  Ht 5\' 4"  (1.626 m)  Wt 162 lb (73.483 kg)  BMI 27.79 kg/m2 Well nourished, well developed, in no acute distress HEENT: normal, [place]/AT, EOMI Neck: no JVD, normal carotid upstroke, no bruit Cardiac:  normal S1, S2; RRR; no murmur Lungs:  clear to auscultation bilaterally, no wheezing, rhonchi or rales Abd: soft, nontender, no hepatomegaly, no bruits Ext: no edema, 2+ distal pulses Skin: warm and dry GU: deferred Neuro: no focal abnormalities noted, AAO x 3  EKG:  05/18/14-sinus bradycardia rate 57 with nonspecific ST-T wave flattening.     ASSESSMENT AND PLAN:  1. Old myocardial infarction -  continue with secondary prevention. Lateral wall infarction. Last stress test in 2012 showed infarct with no significant ischemia. 2. Hypertension, essential-minimally elevated today. She is watching this. Overall has been normal. Metoprolol. 3. 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 Prior catheterization reviewed. 4. Hyperlipidemia-she states that she has been taking her Crestor every other day. Her LDL is 105. Triglycerides are 226. She is going to try to cut back on the blueberry cobbler she states. Ultimately, I would like her LDL less than 100/less than 70. 5. Preoperative risk assessment for possible knee replacements-she may proceed with surgery with low overall cardiac risk at this point. No further stress testing needed. Prior stress test in 2012 reviewed. She's not having any active anginal, heart failure, arrhythmia symptoms. 6. One year followup  Signed, Candee Furbish, MD [facility]  06/06/2014 11:50 AM

## 2014-06-06 NOTE — Patient Instructions (Signed)
Your physician wants you to follow-up in: 12 months with Dr Dawna Part will receive a reminder letter in the mail two months in advance. If you don't receive a letter, please call our office to schedule the follow-up appointment.

## 2014-06-09 ENCOUNTER — Other Ambulatory Visit: Payer: Self-pay | Admitting: Cardiology

## 2014-06-11 NOTE — Telephone Encounter (Signed)
Pam, does Dr. Marlou Porch refill nexium for this pt?

## 2014-06-12 ENCOUNTER — Encounter (HOSPITAL_COMMUNITY): Payer: Self-pay | Admitting: Pharmacy Technician

## 2014-06-12 ENCOUNTER — Encounter (HOSPITAL_COMMUNITY): Payer: Self-pay

## 2014-06-12 ENCOUNTER — Encounter (HOSPITAL_COMMUNITY)
Admission: RE | Admit: 2014-06-12 | Discharge: 2014-06-12 | Disposition: A | Discharge status: Home / Self Care | Payer: Medicare Other | Source: Ambulatory Visit | Attending: Ophthalmology | Admitting: Ophthalmology

## 2014-06-15 MED ORDER — NEOMYCIN-POLYMYXIN-DEXAMETH 3.5-10000-0.1 OP SUSP
OPHTHALMIC | Status: AC
  Filled 2014-06-15: qty 5

## 2014-06-15 MED ORDER — LIDOCAINE HCL 3.5 % OP GEL
OPHTHALMIC | Status: AC
  Filled 2014-06-15: qty 1

## 2014-06-15 MED ORDER — PHENYLEPHRINE HCL 2.5 % OP SOLN
OPHTHALMIC | Status: AC
  Filled 2014-06-15: qty 15

## 2014-06-15 MED ORDER — LIDOCAINE HCL (PF) 1 % IJ SOLN
INTRAMUSCULAR | Status: AC
  Filled 2014-06-15: qty 2

## 2014-06-15 MED ORDER — CYCLOPENTOLATE-PHENYLEPHRINE OP SOLN OPTIME - NO CHARGE
OPHTHALMIC | Status: AC
  Filled 2014-06-15: qty 2

## 2014-06-15 MED ORDER — TETRACAINE HCL 0.5 % OP SOLN
OPHTHALMIC | Status: AC
  Filled 2014-06-15: qty 2

## 2014-06-18 ENCOUNTER — Encounter (HOSPITAL_COMMUNITY): Payer: Self-pay

## 2014-06-18 ENCOUNTER — Ambulatory Visit (HOSPITAL_COMMUNITY)
Admission: RE | Admit: 2014-06-18 | Discharge: 2014-06-18 | Disposition: A | Discharge status: Home / Self Care | Payer: Medicare Other | Source: Ambulatory Visit | Attending: Ophthalmology | Admitting: Ophthalmology

## 2014-06-18 ENCOUNTER — Encounter (HOSPITAL_COMMUNITY)
Admission: RE | Disposition: A | Discharge status: Home / Self Care | Payer: Self-pay | Source: Ambulatory Visit | Attending: Ophthalmology

## 2014-06-18 ENCOUNTER — Encounter (HOSPITAL_COMMUNITY): Payer: Medicare Other | Admitting: Anesthesiology

## 2014-06-18 ENCOUNTER — Ambulatory Visit (HOSPITAL_COMMUNITY): Payer: Medicare Other | Admitting: Anesthesiology

## 2014-06-18 DIAGNOSIS — Z87891 Personal history of nicotine dependence: Secondary | ICD-10-CM | POA: Insufficient documentation

## 2014-06-18 DIAGNOSIS — Z7982 Long term (current) use of aspirin: Secondary | ICD-10-CM | POA: Insufficient documentation

## 2014-06-18 DIAGNOSIS — K219 Gastro-esophageal reflux disease without esophagitis: Secondary | ICD-10-CM | POA: Diagnosis not present

## 2014-06-18 DIAGNOSIS — I10 Essential (primary) hypertension: Secondary | ICD-10-CM | POA: Insufficient documentation

## 2014-06-18 DIAGNOSIS — E039 Hypothyroidism, unspecified: Secondary | ICD-10-CM | POA: Insufficient documentation

## 2014-06-18 DIAGNOSIS — H2510 Age-related nuclear cataract, unspecified eye: Secondary | ICD-10-CM | POA: Diagnosis present

## 2014-06-18 DIAGNOSIS — Z79899 Other long term (current) drug therapy: Secondary | ICD-10-CM | POA: Diagnosis not present

## 2014-06-18 HISTORY — PX: CATARACT EXTRACTION W/PHACO: SHX586

## 2014-06-18 SURGERY — PHACOEMULSIFICATION, CATARACT, WITH IOL INSERTION
Anesthesia: Monitor Anesthesia Care | Site: Eye | Laterality: Right

## 2014-06-18 MED ORDER — MIDAZOLAM HCL 2 MG/2ML IJ SOLN
1.0000 mg | INTRAMUSCULAR | Status: DC | PRN
  Administered 2014-06-18: 2 mg via INTRAVENOUS

## 2014-06-18 MED ORDER — TETRACAINE HCL 0.5 % OP SOLN
1.0000 [drp] | OPHTHALMIC | Status: AC
  Administered 2014-06-18 (×3): 1 [drp] via OPHTHALMIC

## 2014-06-18 MED ORDER — LACTATED RINGERS IV SOLN
INTRAVENOUS | Status: DC | PRN
  Administered 2014-06-18: 07:00:00 via INTRAVENOUS

## 2014-06-18 MED ORDER — LIDOCAINE HCL 3.5 % OP GEL
1.0000 "application " | Freq: Once | OPHTHALMIC | Status: AC
  Administered 2014-06-18: 1 via OPHTHALMIC

## 2014-06-18 MED ORDER — EPINEPHRINE HCL 1 MG/ML IJ SOLN
INTRAMUSCULAR | Status: AC
  Filled 2014-06-18: qty 1

## 2014-06-18 MED ORDER — POVIDONE-IODINE 5 % OP SOLN
OPHTHALMIC | Status: DC | PRN
Start: 2014-06-18 — End: 2014-06-18
  Administered 2014-06-18: 1 via OPHTHALMIC

## 2014-06-18 MED ORDER — MIDAZOLAM HCL 2 MG/2ML IJ SOLN
INTRAMUSCULAR | Status: AC
  Filled 2014-06-18: qty 2

## 2014-06-18 MED ORDER — NEOMYCIN-POLYMYXIN-DEXAMETH 3.5-10000-0.1 OP SUSP
OPHTHALMIC | Status: DC | PRN
Start: 2014-06-18 — End: 2014-06-18
  Administered 2014-06-18: 1 [drp] via OPHTHALMIC

## 2014-06-18 MED ORDER — FENTANYL CITRATE 0.05 MG/ML IJ SOLN
INTRAMUSCULAR | Status: AC
  Filled 2014-06-18: qty 2

## 2014-06-18 MED ORDER — BSS IO SOLN
INTRAOCULAR | Status: DC | PRN
  Administered 2014-06-18: 15 mL via INTRAOCULAR

## 2014-06-18 MED ORDER — PROVISC 10 MG/ML IO SOLN
INTRAOCULAR | Status: DC | PRN
  Administered 2014-06-18: 0.85 mL via INTRAOCULAR

## 2014-06-18 MED ORDER — PHENYLEPHRINE HCL 2.5 % OP SOLN
1.0000 [drp] | OPHTHALMIC | Status: AC
  Administered 2014-06-18 (×3): 1 [drp] via OPHTHALMIC

## 2014-06-18 MED ORDER — CYCLOPENTOLATE-PHENYLEPHRINE 0.2-1 % OP SOLN
1.0000 [drp] | OPHTHALMIC | Status: AC
  Administered 2014-06-18 (×3): 1 [drp] via OPHTHALMIC

## 2014-06-18 MED ORDER — LIDOCAINE HCL (PF) 1 % IJ SOLN
INTRAMUSCULAR | Status: DC | PRN
  Administered 2014-06-18: .4 mL

## 2014-06-18 MED ORDER — EPINEPHRINE HCL 1 MG/ML IJ SOLN
INTRAOCULAR | Status: DC | PRN
  Administered 2014-06-18: 08:00:00

## 2014-06-18 MED ORDER — LIDOCAINE 3.5 % OP GEL OPTIME - NO CHARGE
OPHTHALMIC | Status: DC | PRN
  Administered 2014-06-18: 1 [drp] via OPHTHALMIC

## 2014-06-18 MED ORDER — LACTATED RINGERS IV SOLN
INTRAVENOUS | Status: DC
  Administered 2014-06-18: 08:00:00 via INTRAVENOUS

## 2014-06-18 MED ORDER — FENTANYL CITRATE 0.05 MG/ML IJ SOLN
25.0000 ug | INTRAMUSCULAR | Status: AC
  Administered 2014-06-18: 25 ug via INTRAVENOUS

## 2014-06-18 SURGICAL SUPPLY — 11 items
CLOTH BEACON ORANGE TIMEOUT ST (SAFETY) ×1 IMPLANT
EYE SHIELD UNIVERSAL CLEAR (GAUZE/BANDAGES/DRESSINGS) ×1 IMPLANT
GLOVE BIOGEL PI IND STRL 7.0 (GLOVE) IMPLANT
GLOVE BIOGEL PI INDICATOR 7.0 (GLOVE) ×1
GLOVE EXAM NITRILE MD LF STRL (GLOVE) ×1 IMPLANT
PAD ARMBOARD 7.5X6 YLW CONV (MISCELLANEOUS) ×1 IMPLANT
SIGHTPATH CAT PROC W REG LENS (Ophthalmic Related) ×2 IMPLANT
SYRINGE LUER LOK 1CC (MISCELLANEOUS) ×1 IMPLANT
TAPE SURG TRANSPORE 1 IN (GAUZE/BANDAGES/DRESSINGS) IMPLANT
TAPE SURGICAL TRANSPORE 1 IN (GAUZE/BANDAGES/DRESSINGS) ×1
WATER STERILE IRR 250ML POUR (IV SOLUTION) ×1 IMPLANT

## 2014-06-18 NOTE — Op Note (Signed)
Date of Admission: 06/18/2014  Date of Surgery: 06/18/2014   Pre-Op Dx: Cataract Right Eye  Post-Op Dx: Senile Nuclear Cataract Right  Eye,  Dx Code 366.16  Surgeon: Tonny Branch, M.D.  Assistants: None  Anesthesia: Topical with MAC  Indications: Painless, progressive loss of vision with compromise of daily activities.  Surgery: Cataract Extraction with Intraocular lens Implant Right Eye  Discription: The patient had dilating drops and viscous lidocaine placed into the Right eye in the pre-op holding area. After transfer to the operating room, a time out was performed. The patient was then prepped and draped. Beginning with a 71 degree blade a paracentesis port was made at the surgeon's 2 o'clock position. The anterior chamber was then filled with 1% non-preserved lidocaine. This was followed by filling the anterior chamber with Provisc.  A 2.58mm keratome blade was used to make a clear corneal incision at the temporal limbus.  A bent cystatome needle was used to create a continuous tear capsulotomy. Hydrodissection was performed with balanced salt solution on a Fine canula. The lens nucleus was then removed using the phacoemulsification handpiece. Residual cortex was removed with the I&A handpiece. The anterior chamber and capsular bag were refilled with Provisc. A posterior chamber intraocular lens was placed into the capsular bag with it's injector. The implant was positioned with the Kuglan hook. The Provisc was then removed from the anterior chamber and capsular bag with the I&A handpiece. Stromal hydration of the main incision and paracentesis port was performed with BSS on a Fine canula. The wounds were tested for leak which was negative. The patient tolerated the procedure well. There were no operative complications. The patient was then transferred to the recovery room in stable condition.  Complications: None  Specimen: None  EBL: None  Prosthetic device: Hoya iSert 250, power 10.5 D,  SN [id number].

## 2014-06-18 NOTE — Anesthesia Postprocedure Evaluation (Signed)
  Anesthesia Post-op Note  Patient: Jessica Taylor  Procedure(s) Performed: Procedure(s): CATARACT EXTRACTION PHACO AND INTRAOCULAR LENS PLACEMENT RIGHT EYE CDE=10.84 (Right)  Patient Location: Short Stay  Anesthesia Type:MAC  Level of Consciousness: awake, alert , oriented and patient cooperative  Airway and Oxygen Therapy: Patient Spontanous Breathing  Post-op Pain: none  Post-op Assessment: Post-op Vital signs reviewed, Patient's Cardiovascular Status Stable, Respiratory Function Stable, Patent Airway, No signs of Nausea or vomiting and Pain level controlled  Post-op Vital Signs: Reviewed and stable  Last Vitals:  Filed Vitals:   06/18/14 0755  BP: 120/57  Pulse:   Temp:   Resp: 67    Complications: No apparent anesthesia complications

## 2014-06-18 NOTE — Transfer of Care (Signed)
Immediate Anesthesia Transfer of Care Note  Patient: Jessica Taylor  Procedure(s) Performed: Procedure(s): CATARACT EXTRACTION PHACO AND INTRAOCULAR LENS PLACEMENT RIGHT EYE CDE=10.84 (Right)  Patient Location: Short Stay  Anesthesia Type:MAC  Level of Consciousness: awake, alert , oriented and patient cooperative  Airway & Oxygen Therapy: Patient Spontanous Breathing  Post-op Assessment: Report given to PACU RN and Post -op Vital signs reviewed and stable  Post vital signs: Reviewed and stable  Complications: No apparent anesthesia complications

## 2014-06-18 NOTE — Anesthesia Procedure Notes (Signed)
Procedure Name: MAC Date/Time: 06/18/2014 8:00 AM Performed by: Andree Elk, Lakin Romer A Pre-anesthesia Checklist: Patient identified, Timeout performed, Emergency Drugs available, Suction available and Patient being monitored Oxygen Delivery Method: Nasal cannula

## 2014-06-18 NOTE — Discharge Instructions (Signed)
Cataract Surgery Care After Refer to this sheet in the next few weeks. These instructions provide you with information on caring for yourself after your procedure. Your caregiver may also give you more specific instructions. Your treatment has been planned according to current medical practices, but problems sometimes occur. Call your caregiver if you have any problems or questions after your procedure.  HOME CARE INSTRUCTIONS   Avoid strenuous activities as directed by your caregiver.  Ask your caregiver when you can resume driving.  Use eyedrops or other medicines to help healing and control pressure inside your eye as directed by your caregiver.  Only take over-the-counter or prescription medicines for pain, discomfort, or fever as directed by your caregiver.  Do not to touch or rub your eyes.  You may be instructed to use a protective shield during the first few days and nights after surgery. If not, wear sunglasses to protect your eyes. This is to protect the eye from pressure or from being accidentally bumped.  Keep the area around your eye clean and dry. Avoid swimming or allowing water to hit you directly in the face while showering. Keep soap and shampoo out of your eyes.  Do not bend or lift heavy objects. Bending increases pressure in the eye. You can walk, climb stairs, and do light household chores.  Do not put a contact lens into the eye that had surgery until your caregiver says it is okay to do so.  Ask your doctor when you can return to work. This will depend on the kind of work that you do. If you work in a dusty environment, you may be advised to wear protective eyewear for a period of time.  Ask your caregiver when it will be safe to engage in sexual activity.  Continue with your regular eye exams as directed by your caregiver. What to expect:  It is normal to feel itching and mild discomfort for a few days after cataract surgery. Some fluid discharge is also common,  and your eye may be sensitive to light and touch.  After 1 to 2 days, even moderate discomfort should disappear. In most cases, healing will take about 6 weeks.  If you received an intraocular lens (IOL), you may notice that colors are very bright or have a blue tinge. Also, if you have been in bright sunlight, everything may appear reddish for a few hours. If you see these color tinges, it is because your lens is clear and no longer cloudy. Within a few months after receiving an IOL, these extra colors should go away. When you have healed, you will probably need new glasses. SEEK MEDICAL CARE IF:   You have increased bruising around your eye.  You have discomfort not helped by medicine. SEEK IMMEDIATE MEDICAL CARE IF:   You have a fever.  You have a worsening or sudden vision loss.  You have redness, swelling, or increasing pain in the eye.  You have a thick discharge from the eye that had surgery. MAKE SURE YOU:  Understand these instructions.  Will watch your condition.  Will get help right away if you are not doing well or get worse. Document Released: 05/29/2005 Document Revised: 02/01/2012 Document Reviewed: 07/03/2011 [facility] Patient Information 2015 [place], [place]. This information is not intended to replace advice given to you by your health care provider. Make sure you discuss any questions you have with your health care provider.    PATIENT INSTRUCTIONS POST-ANESTHESIA  IMMEDIATELY FOLLOWING SURGERY:  Do not  drive or operate machinery for the first twenty four hours after surgery.  Do not make any important decisions for twenty four hours after surgery or while taking narcotic pain medications or sedatives.  If you develop intractable nausea and vomiting or a severe headache please notify your doctor immediately.  FOLLOW-UP:  Please make an appointment with your surgeon as instructed. You do not need to follow up with anesthesia unless specifically instructed to do  so.  WOUND CARE INSTRUCTIONS (if applicable):  Keep a dry clean dressing on the anesthesia/puncture wound site if there is drainage.  Once the wound has quit draining you may leave it open to air.  Generally you should leave the bandage intact for twenty four hours unless there is drainage.  If the epidural site drains for more than 36-48 hours please call the anesthesia department.  QUESTIONS?:  Please feel free to call your physician or the hospital operator if you have any questions, and they will be happy to assist you.

## 2014-06-18 NOTE — H&P (Signed)
I have reviewed the H&P, the patient was re-examined, and I have identified no interval changes in medical condition and plan of care since the history and physical of record  

## 2014-06-18 NOTE — Anesthesia Preprocedure Evaluation (Addendum)
Anesthesia Evaluation  Patient identified by MRN, date of birth, ID band Patient awake    Reviewed: Allergy & Precautions, H&P , NPO status , Patient's Chart, lab work & pertinent test results, reviewed documented beta blocker date and time   Airway Mallampati: II TM Distance: >3 FB     Dental  (+) Teeth Intact   Pulmonary former smoker,  breath sounds clear to auscultation        Cardiovascular hypertension, Pt. on medications and Pt. on home beta blockers + CAD and + Past MI Rhythm:Regular Rate:Normal     Neuro/Psych Vertigo     GI/Hepatic GERD-  Medicated and Controlled,  Endo/Other  Hypothyroidism   Renal/GU      Musculoskeletal   Abdominal   Peds  Hematology   Anesthesia Other Findings   Reproductive/Obstetrics                           Anesthesia Physical Anesthesia Plan  ASA: III  Anesthesia Plan: MAC   Post-op Pain Management:    Induction: Intravenous  Airway Management Planned: Nasal Cannula  Additional Equipment:   Intra-op Plan:   Post-operative Plan:   Informed Consent: I have reviewed the patients History and Physical, chart, labs and discussed the procedure including the risks, benefits and alternatives for the proposed anesthesia with the patient or authorized representative who has indicated his/her understanding and acceptance.     Plan Discussed with:   Anesthesia Plan Comments:         Anesthesia Quick Evaluation

## 2014-06-19 ENCOUNTER — Encounter (HOSPITAL_COMMUNITY): Payer: Self-pay | Admitting: Ophthalmology

## 2014-07-18 ENCOUNTER — Other Ambulatory Visit: Payer: Self-pay | Admitting: Cardiology

## 2014-08-17 ENCOUNTER — Other Ambulatory Visit: Payer: Self-pay | Admitting: *Deleted

## 2014-08-17 MED ORDER — ROSUVASTATIN CALCIUM 20 MG PO TABS: 20.0000 mg | ORAL_TABLET | Freq: Every day | ORAL | Status: DC

## 2014-12-01 ENCOUNTER — Other Ambulatory Visit: Payer: Self-pay | Admitting: Orthopedic Surgery

## 2014-12-10 ENCOUNTER — Telehealth: Payer: Self-pay | Admitting: Cardiology

## 2014-12-10 NOTE — Telephone Encounter (Signed)
Received request from Nurse fax box, documents faxed for surgical clearance. To: [facility] Fax number: [phone] Attention: 1.18.16/km

## 2014-12-28 ENCOUNTER — Ambulatory Visit (HOSPITAL_COMMUNITY)
Admission: RE | Admit: 2014-12-28 | Discharge: 2014-12-28 | Disposition: A | Discharge status: Home / Self Care | Payer: Medicare Other | Source: Ambulatory Visit | Attending: Orthopedic Surgery | Admitting: Orthopedic Surgery

## 2014-12-28 ENCOUNTER — Encounter (HOSPITAL_COMMUNITY): Payer: Self-pay

## 2014-12-28 ENCOUNTER — Encounter (HOSPITAL_COMMUNITY)
Admission: RE | Admit: 2014-12-28 | Discharge: 2014-12-28 | Disposition: A | Discharge status: Home / Self Care | Payer: Medicare Other | Source: Ambulatory Visit | Attending: Orthopedic Surgery | Admitting: Orthopedic Surgery

## 2014-12-28 DIAGNOSIS — Z0183 Encounter for blood typing: Secondary | ICD-10-CM | POA: Insufficient documentation

## 2014-12-28 DIAGNOSIS — M179 Osteoarthritis of knee, unspecified: Secondary | ICD-10-CM | POA: Insufficient documentation

## 2014-12-28 DIAGNOSIS — Z955 Presence of coronary angioplasty implant and graft: Secondary | ICD-10-CM | POA: Insufficient documentation

## 2014-12-28 DIAGNOSIS — I10 Essential (primary) hypertension: Secondary | ICD-10-CM | POA: Insufficient documentation

## 2014-12-28 DIAGNOSIS — Z01812 Encounter for preprocedural laboratory examination: Secondary | ICD-10-CM | POA: Diagnosis not present

## 2014-12-28 DIAGNOSIS — Z01818 Encounter for other preprocedural examination: Secondary | ICD-10-CM | POA: Diagnosis not present

## 2014-12-28 DIAGNOSIS — I252 Old myocardial infarction: Secondary | ICD-10-CM | POA: Diagnosis not present

## 2014-12-28 DIAGNOSIS — Z87891 Personal history of nicotine dependence: Secondary | ICD-10-CM | POA: Insufficient documentation

## 2014-12-28 HISTORY — DX: Anemia, unspecified: D64.9

## 2014-12-28 HISTORY — DX: Malignant (primary) neoplasm, unspecified: C80.1

## 2014-12-28 LAB — URINALYSIS, ROUTINE W REFLEX MICROSCOPIC
Bilirubin Urine: NEGATIVE
GLUCOSE, UA: NEGATIVE mg/dL
HGB URINE DIPSTICK: NEGATIVE
KETONES UR: NEGATIVE mg/dL
Leukocytes, UA: NEGATIVE
NITRITE: NEGATIVE
Protein, ur: NEGATIVE mg/dL
SPECIFIC GRAVITY, URINE: 1.014 (ref 1.005–1.030)
Urobilinogen, UA: 0.2 mg/dL (ref 0.0–1.0)
pH: 6.5 (ref 5.0–8.0)

## 2014-12-28 LAB — CBC WITH DIFFERENTIAL/PLATELET
BASOS ABS: 0 10*3/uL (ref 0.0–0.1)
Basophils Relative: 1 % (ref 0–1)
EOS PCT: 3 % (ref 0–5)
Eosinophils Absolute: 0.2 10*3/uL (ref 0.0–0.7)
HEMATOCRIT: 46 % (ref 36.0–46.0)
Hemoglobin: 15.1 g/dL — ABNORMAL HIGH (ref 12.0–15.0)
LYMPHS ABS: 1.8 10*3/uL (ref 0.7–4.0)
Lymphocytes Relative: 29 % (ref 12–46)
MCH: 29.2 pg (ref 26.0–34.0)
MCHC: 32.8 g/dL (ref 30.0–36.0)
MCV: 89 fL (ref 78.0–100.0)
Monocytes Absolute: 0.3 10*3/uL (ref 0.1–1.0)
Monocytes Relative: 5 % (ref 3–12)
NEUTROS ABS: 3.9 10*3/uL (ref 1.7–7.7)
NEUTROS PCT: 62 % (ref 43–77)
Platelets: 199 10*3/uL (ref 150–400)
RBC: 5.17 MIL/uL — AB (ref 3.87–5.11)
RDW: 14.4 % (ref 11.5–15.5)
WBC: 6.2 10*3/uL (ref 4.0–10.5)

## 2014-12-28 LAB — PROTIME-INR
INR: 1.01 (ref 0.00–1.49)
PROTHROMBIN TIME: 13.4 s (ref 11.6–15.2)

## 2014-12-28 LAB — APTT: aPTT: 36 seconds (ref 24–37)

## 2014-12-28 LAB — COMPREHENSIVE METABOLIC PANEL
ALBUMIN: 4.5 g/dL (ref 3.5–5.2)
ALK PHOS: 86 U/L (ref 39–117)
ALT: 23 U/L (ref 0–35)
ANION GAP: 8 (ref 5–15)
AST: 24 U/L (ref 0–37)
BUN: 19 mg/dL (ref 6–23)
CHLORIDE: 103 mmol/L (ref 96–112)
CO2: 29 mmol/L (ref 19–32)
Calcium: 9.9 mg/dL (ref 8.4–10.5)
Creatinine, Ser: 1.24 mg/dL — ABNORMAL HIGH (ref 0.50–1.10)
GFR calc non Af Amer: 41 mL/min — ABNORMAL LOW (ref >90)
GFR, EST AFRICAN AMERICAN: 48 mL/min — AB (ref >90)
Glucose, Bld: 101 mg/dL — ABNORMAL HIGH (ref 70–99)
Potassium: 4.2 mmol/L (ref 3.5–5.1)
Sodium: 140 mmol/L (ref 135–145)
Total Bilirubin: 0.7 mg/dL (ref 0.3–1.2)
Total Protein: 6.9 g/dL (ref 6.0–8.3)

## 2014-12-28 LAB — TYPE AND SCREEN
ABO/RH(D): O POS
Antibody Screen: NEGATIVE

## 2014-12-28 LAB — SURGICAL PCR SCREEN
MRSA, PCR: NEGATIVE
Staphylococcus aureus: NEGATIVE

## 2014-12-28 LAB — ABO/RH: ABO/RH(D): O POS

## 2014-12-28 MED ORDER — CHLORHEXIDINE GLUCONATE 4 % EX LIQD: 60.0000 mL | Freq: Once | CUTANEOUS | Status: DC

## 2014-12-28 NOTE — Pre-Procedure Instructions (Signed)
Jessica Taylor  12/28/2014   Your procedure is scheduled on:  Monday January 07, 2015 at 1251 PM  Report to [facility] Admitting at 1051 AM.  Call this number if you have problems the morning of surgery: [phone]   Remember:   Do not eat food or drink liquids after midnight Sunday 01/06/15.   Take these medicines the morning of surgery with A SIP OF WATER: Amlodipine (Norvasc), Hydrocodone if needed for pain, Synthroid, Metoprolol (Lopressor), and Nexium.  Stop Aspirin, Fish oil, and any Nsaids 5 days prior to surgery 01/02/15.   Do not wear jewelry, make-up or nail polish.  Do not wear lotions, powders, or perfumes. You may not wear deodorant.  Do not shave 48 hours prior to surgery.  Do not bring valuables to the hospital.  [facility] is not responsible                  for any belongings or valuables.               Contacts, dentures or bridgework may not be worn into surgery.  Leave suitcase in the car. After surgery it may be brought to your room.  For patients admitted to the hospital, discharge time is determined by your                treatment team.               Patients discharged the day of surgery will not be allowed to drive  home.    Special Instructions: [place] - Preparing for Surgery  Before surgery, you can play an important role.  Because skin is not sterile, your skin needs to be as free of germs as possible.  You can reduce the number of germs on you skin by washing with CHG (chlorahexidine gluconate) soap before surgery.  CHG is an antiseptic cleaner which kills germs and bonds with the skin to continue killing germs even after washing.  Please DO NOT use if you have an allergy to CHG or antibacterial soaps.  If your skin becomes reddened/irritated stop using the CHG and inform your nurse when you arrive at Short Stay.  Do not shave (including legs and underarms) for at least 48 hours prior to the first CHG shower.  You may shave your  face.  Please follow these instructions carefully:   1.  Shower with CHG Soap the night before surgery and the                                morning of Surgery.  2.  If you choose to wash your hair, wash your hair first as usual with your       normal shampoo.  3.  After you shampoo, rinse your hair and body thoroughly to remove the                      Shampoo.  4.  Use CHG as you would any other liquid soap.  You can apply chg directly       to the skin and wash gently with scrungie or a clean washcloth.  5.  Apply the CHG Soap to your body ONLY FROM THE NECK DOWN.        Do not use on open wounds or open sores.  Avoid contact with your eyes,  ears, mouth and genitals (private parts).  Wash genitals (private parts)       with your normal soap.  6.  Wash thoroughly, paying special attention to the area where your surgery        will be performed.  7.  Thoroughly rinse your body with warm water from the neck down.  8.  DO NOT shower/wash with your normal soap after using and rinsing off       the CHG Soap.  9.  Pat yourself dry with a clean towel.            10.  Wear clean pajamas.            11.  Place clean sheets on your bed the night of your first shower and do not        sleep with pets.  Day of Surgery  Do not apply any lotions/deoderants the morning of surgery.  Please wear clean clothes to the hospital/surgery center.      Please read over the following fact sheets that you were given: Pain Booklet, Coughing and Deep Breathing, Blood Transfusion Information, MRSA Information and Surgical Site Infection Prevention

## 2014-12-31 ENCOUNTER — Encounter (HOSPITAL_COMMUNITY): Payer: Self-pay | Admitting: Emergency Medicine

## 2014-12-31 ENCOUNTER — Encounter (HOSPITAL_COMMUNITY): Payer: Self-pay | Admitting: Anesthesiology

## 2014-12-31 NOTE — Progress Notes (Signed)
Anesthesia Chart Review:  Pt is 74 year old female scheduled for R unicompartmental knee on 01/07/2015 with Dr. Berenice Primas.   Cardiologist is Dr. Marlou Porch.   PMH includes: MI 2000, HTN, hypothyroidism, anemia. By notes, cardiac cath at some point in time in past with 5 stents. Former smoker. BMI 29.   Preoperative labs reviewed. Cr 1.24.   EKG 05/19/2015: Sinus bradycardia (57 bpm). Low voltage QRS. Dr. Marlou Porch interprets as nonspecific ST-T wave flattening.   Per Dr. 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   Dr. Marlou Porch has given cardiac clearance for surgery- see media tab in Epic.   If no changes, I anticipate pt can proceed with surgery as scheduled.   Willeen Cass, FNP-BC [facility] Short Stay Surgical Center/Anesthesiology Phone: [phone] 12/31/2014 1:39 PM

## 2015-01-04 NOTE — Progress Notes (Signed)
Pt notified of time change;to arrive at 1030.Verbalized understanding

## 2015-01-06 MED ORDER — CEFAZOLIN SODIUM-DEXTROSE 2-3 GM-% IV SOLR: 2.0000 g | INTRAVENOUS | Status: DC

## 2015-01-07 ENCOUNTER — Encounter (HOSPITAL_COMMUNITY): Admission: RE | Payer: Self-pay | Source: Ambulatory Visit

## 2015-01-07 ENCOUNTER — Inpatient Hospital Stay (HOSPITAL_COMMUNITY): Admission: RE | Admit: 2015-01-07 | Payer: Medicare Other | Source: Ambulatory Visit | Admitting: Orthopedic Surgery

## 2015-01-07 SURGERY — ARTHROPLASTY, KNEE, UNICOMPARTMENTAL
Anesthesia: General | Site: Knee | Laterality: Right

## 2015-01-25 ENCOUNTER — Encounter (HOSPITAL_COMMUNITY): Payer: Self-pay

## 2015-01-25 ENCOUNTER — Other Ambulatory Visit (HOSPITAL_COMMUNITY): Payer: Self-pay | Admitting: *Deleted

## 2015-01-25 ENCOUNTER — Encounter (HOSPITAL_COMMUNITY)
Admission: RE | Admit: 2015-01-25 | Discharge: 2015-01-25 | Disposition: A | Discharge status: Home / Self Care | Payer: Medicare Other | Source: Ambulatory Visit | Attending: Orthopedic Surgery | Admitting: Orthopedic Surgery

## 2015-01-25 DIAGNOSIS — Z01812 Encounter for preprocedural laboratory examination: Secondary | ICD-10-CM | POA: Insufficient documentation

## 2015-01-25 DIAGNOSIS — Z01818 Encounter for other preprocedural examination: Secondary | ICD-10-CM | POA: Diagnosis present

## 2015-01-25 DIAGNOSIS — Z0183 Encounter for blood typing: Secondary | ICD-10-CM | POA: Diagnosis not present

## 2015-01-25 DIAGNOSIS — M179 Osteoarthritis of knee, unspecified: Secondary | ICD-10-CM | POA: Insufficient documentation

## 2015-01-25 HISTORY — DX: Atherosclerotic heart disease of native coronary artery without angina pectoris: I25.10

## 2015-01-25 LAB — CBC
HEMATOCRIT: 44 % (ref 36.0–46.0)
Hemoglobin: 14.3 g/dL (ref 12.0–15.0)
MCH: 29.2 pg (ref 26.0–34.0)
MCHC: 32.5 g/dL (ref 30.0–36.0)
MCV: 90 fL (ref 78.0–100.0)
Platelets: 180 10*3/uL (ref 150–400)
RBC: 4.89 MIL/uL (ref 3.87–5.11)
RDW: 14.5 % (ref 11.5–15.5)
WBC: 5.9 10*3/uL (ref 4.0–10.5)

## 2015-01-25 LAB — BASIC METABOLIC PANEL
Anion gap: 8 (ref 5–15)
BUN: 23 mg/dL (ref 6–23)
CO2: 25 mmol/L (ref 19–32)
Calcium: 9.6 mg/dL (ref 8.4–10.5)
Chloride: 109 mmol/L (ref 96–112)
Creatinine, Ser: 1.12 mg/dL — ABNORMAL HIGH (ref 0.50–1.10)
GFR calc Af Amer: 54 mL/min — ABNORMAL LOW (ref >90)
GFR calc non Af Amer: 47 mL/min — ABNORMAL LOW (ref >90)
GLUCOSE: 112 mg/dL — AB (ref 70–99)
POTASSIUM: 4.1 mmol/L (ref 3.5–5.1)
Sodium: 142 mmol/L (ref 135–145)

## 2015-01-25 LAB — SURGICAL PCR SCREEN
MRSA, PCR: NEGATIVE
Staphylococcus aureus: NEGATIVE

## 2015-01-25 LAB — TYPE AND SCREEN
ABO/RH(D): O POS
ANTIBODY SCREEN: NEGATIVE

## 2015-01-25 LAB — APTT: aPTT: 33 seconds (ref 24–37)

## 2015-01-25 LAB — PROTIME-INR
INR: 1.02 (ref 0.00–1.49)
Prothrombin Time: 13.5 seconds (ref 11.6–15.2)

## 2015-01-25 NOTE — Pre-Procedure Instructions (Signed)
Jessica Taylor  01/25/2015   Your procedure is scheduled on:  02/04/2015  Report to [facility] Admitting at 10:30 AM.  Call this number if you have problems the morning of surgery: [phone]   Remember:   Do not eat food or drink liquids after midnight.  On SUNDAY   Take these medicines the morning of surgery with A SIP OF WATER: Amlodipine, thyroid medicine, Nexium     Do not wear jewelry, make-up or nail polish.   Do not wear lotions, powders, or perfumes. You may wear deodorant.   Do not shave 48 hours prior to surgery.    Do not bring valuables to the hospital.  [facility] is not responsible                  for any belongings or valuables.               Contacts, dentures or bridgework may not be worn into surgery.   Leave suitcase in the car. After surgery it may be brought to your room.   For patients admitted to the hospital, discharge time is determined by your                treatment team.               Patients discharged the day of surgery will not be allowed to drive  Home.   Name and phone number of your driver: with spouse   Special Instructions: Special Instructions: [place] - Preparing for Surgery  Before surgery, you can play an important role.  Because skin is not sterile, your skin needs to be as free of germs as possible.  You can reduce the number of germs on you skin by washing with CHG (chlorahexidine gluconate) soap before surgery.  CHG is an antiseptic cleaner which kills germs and bonds with the skin to continue killing germs even after washing.  Please DO NOT use if you have an allergy to CHG or antibacterial soaps.  If your skin becomes reddened/irritated stop using the CHG and inform your nurse when you arrive at Short Stay.  Do not shave (including legs and underarms) for at least 48 hours prior to the first CHG shower.  You may shave your face.  Please follow these instructions carefully:   1.  Shower with CHG Soap the night  before surgery and the  morning of Surgery.  2.  If you choose to wash your hair, wash your hair first as usual with your  normal shampoo.  3.  After you shampoo, rinse your hair and body thoroughly to remove the  Shampoo.  4.  Use CHG as you would any other liquid soap.  You can apply chg directly to the skin and wash gently with scrungie or a clean washcloth.  5.  Apply the CHG Soap to your body ONLY FROM THE NECK DOWN.    Do not use on open wounds or open sores.  Avoid contact with your eyes, ears, mouth and genitals (private parts).  Wash genitals (private parts)   with your normal soap.  6.  Wash thoroughly, paying special attention to the area where your surgery will be performed.  7.  Thoroughly rinse your body with warm water from the neck down.  8.  DO NOT shower/wash with your normal soap after using and rinsing off   the CHG Soap.  9.  Pat yourself dry with a clean towel.  10.  Wear clean pajamas.            11.  Place clean sheets on your bed the night of your first shower and do not sleep with pets.  Day of Surgery  Do not apply any lotions/deodorants the morning of surgery.  Please wear clean clothes to the hospital/surgery center.   Please read over the following fact sheets that you were given: Pain Booklet, Coughing and Deep Breathing, Blood Transfusion Information, MRSA Information and Surgical Site Infection Prevention

## 2015-01-25 NOTE — Progress Notes (Signed)
Due to a weather event surgery was postponed back in Feb. 2016.

## 2015-01-25 NOTE — Progress Notes (Signed)
Call to Dr. Berenice Primas office, requested that new orders be put into EPIC. Left voicemail for Jessica Taylor.

## 2015-01-28 ENCOUNTER — Other Ambulatory Visit: Payer: Self-pay | Admitting: Orthopedic Surgery

## 2015-02-03 NOTE — Anesthesia Preprocedure Evaluation (Addendum)
Anesthesia Evaluation  Patient identified by MRN, date of birth, ID band Patient awake    Reviewed: Allergy & Precautions, NPO status , Patient's Chart, lab work & pertinent test results  Airway Mallampati: II   Neck ROM: Full    Dental  (+) Dental Advisory Given   Pulmonary former smoker (quit 2000 63 pack year),  breath sounds clear to auscultation        Cardiovascular hypertension, Pt. on medications + CAD and + Past MI Rhythm:Regular  Cleared by Cardiology, 5 STENTS 98-2002, STRESS 2012 with minimal old MI with Normal EF, being followed   Neuro/Psych    GI/Hepatic GERD-  Medicated,  Endo/Other  Hypothyroidism   Renal/GU      Musculoskeletal   Abdominal (+)  Abdomen: soft.    Peds  Hematology 14/44   Anesthesia Other Findings   Reproductive/Obstetrics                            Anesthesia Physical Anesthesia Plan  ASA: III  Anesthesia Plan: Spinal   Post-op Pain Management:    Induction:   Airway Management Planned: Nasal Cannula  Additional Equipment:   Intra-op Plan:   Post-operative Plan:   Informed Consent: I have reviewed the patients History and Physical, chart, labs and discussed the procedure including the risks, benefits and alternatives for the proposed anesthesia with the patient or authorized representative who has indicated his/her understanding and acceptance.     Plan Discussed with:   Anesthesia Plan Comments: (If refusesd spinal will do GA and femoral nerve block)        Anesthesia Quick Evaluation

## 2015-02-04 ENCOUNTER — Inpatient Hospital Stay (HOSPITAL_COMMUNITY)
Admission: RE | Admit: 2015-02-04 | Discharge: 2015-02-06 | DRG: 470 | Disposition: A | Discharge status: Home Health | Payer: Medicare Other | Source: Ambulatory Visit | Attending: Orthopedic Surgery | Admitting: Orthopedic Surgery

## 2015-02-04 ENCOUNTER — Encounter (HOSPITAL_COMMUNITY)
Admission: RE | Disposition: A | Discharge status: Home Health | Payer: Self-pay | Source: Ambulatory Visit | Attending: Orthopedic Surgery

## 2015-02-04 ENCOUNTER — Encounter (HOSPITAL_COMMUNITY): Payer: Self-pay | Admitting: *Deleted

## 2015-02-04 ENCOUNTER — Inpatient Hospital Stay (HOSPITAL_COMMUNITY): Payer: Medicare Other | Admitting: Anesthesiology

## 2015-02-04 DIAGNOSIS — M25561 Pain in right knee: Secondary | ICD-10-CM | POA: Diagnosis present

## 2015-02-04 DIAGNOSIS — Z91013 Allergy to seafood: Secondary | ICD-10-CM

## 2015-02-04 DIAGNOSIS — I252 Old myocardial infarction: Secondary | ICD-10-CM

## 2015-02-04 DIAGNOSIS — Z79899 Other long term (current) drug therapy: Secondary | ICD-10-CM

## 2015-02-04 DIAGNOSIS — R110 Nausea: Secondary | ICD-10-CM | POA: Diagnosis not present

## 2015-02-04 DIAGNOSIS — E780 Pure hypercholesterolemia: Secondary | ICD-10-CM | POA: Diagnosis present

## 2015-02-04 DIAGNOSIS — Z87891 Personal history of nicotine dependence: Secondary | ICD-10-CM | POA: Diagnosis not present

## 2015-02-04 DIAGNOSIS — T391X5A Adverse effect of 4-Aminophenol derivatives, initial encounter: Secondary | ICD-10-CM | POA: Diagnosis not present

## 2015-02-04 DIAGNOSIS — Y92230 Patient room in hospital as the place of occurrence of the external cause: Secondary | ICD-10-CM

## 2015-02-04 DIAGNOSIS — E039 Hypothyroidism, unspecified: Secondary | ICD-10-CM | POA: Diagnosis present

## 2015-02-04 DIAGNOSIS — M1711 Unilateral primary osteoarthritis, right knee: Secondary | ICD-10-CM | POA: Diagnosis present

## 2015-02-04 DIAGNOSIS — K219 Gastro-esophageal reflux disease without esophagitis: Secondary | ICD-10-CM | POA: Diagnosis present

## 2015-02-04 DIAGNOSIS — I10 Essential (primary) hypertension: Secondary | ICD-10-CM | POA: Diagnosis present

## 2015-02-04 DIAGNOSIS — I2510 Atherosclerotic heart disease of native coronary artery without angina pectoris: Secondary | ICD-10-CM | POA: Diagnosis present

## 2015-02-04 DIAGNOSIS — Z7982 Long term (current) use of aspirin: Secondary | ICD-10-CM | POA: Diagnosis not present

## 2015-02-04 HISTORY — PX: PARTIAL KNEE ARTHROPLASTY: SHX2174

## 2015-02-04 SURGERY — ARTHROPLASTY, KNEE, UNICOMPARTMENTAL
Anesthesia: Spinal | Site: Knee | Laterality: Right

## 2015-02-04 MED ORDER — BUPIVACAINE LIPOSOME 1.3 % IJ SUSP
20.0000 mL | Freq: Once | INTRAMUSCULAR | Status: DC
  Filled 2015-02-04: qty 20

## 2015-02-04 MED ORDER — FENTANYL CITRATE 0.05 MG/ML IJ SOLN
INTRAMUSCULAR | Status: AC
  Filled 2015-02-04: qty 2

## 2015-02-04 MED ORDER — DIPHENHYDRAMINE HCL 12.5 MG/5ML PO ELIX: 12.5000 mg | ORAL_SOLUTION | ORAL | Status: DC | PRN

## 2015-02-04 MED ORDER — LACTATED RINGERS IV SOLN
INTRAVENOUS | Status: DC
  Administered 2015-02-04: 11:00:00 via INTRAVENOUS

## 2015-02-04 MED ORDER — ASPIRIN EC 325 MG PO TBEC: 325.0000 mg | DELAYED_RELEASE_TABLET | Freq: Two times a day (BID) | ORAL | Status: DC

## 2015-02-04 MED ORDER — DOCUSATE SODIUM 100 MG PO CAPS
100.0000 mg | ORAL_CAPSULE | Freq: Two times a day (BID) | ORAL | Status: DC
  Administered 2015-02-05 – 2015-02-06 (×3): 100 mg via ORAL
  Filled 2015-02-04 (×4): qty 1

## 2015-02-04 MED ORDER — PROMETHAZINE HCL 25 MG/ML IJ SOLN
6.2500 mg | INTRAMUSCULAR | Status: DC | PRN
  Administered 2015-02-04: 6.25 mg via INTRAVENOUS

## 2015-02-04 MED ORDER — LEVOTHYROXINE SODIUM 50 MCG PO TABS
50.0000 ug | ORAL_TABLET | Freq: Every day | ORAL | Status: DC
  Administered 2015-02-05 – 2015-02-06 (×2): 50 ug via ORAL
  Filled 2015-02-04 (×2): qty 1

## 2015-02-04 MED ORDER — POLYETHYLENE GLYCOL 3350 17 G PO PACK: 17.0000 g | PACK | Freq: Every day | ORAL | Status: DC | PRN

## 2015-02-04 MED ORDER — MIDAZOLAM HCL 2 MG/2ML IJ SOLN
INTRAMUSCULAR | Status: AC
  Filled 2015-02-04: qty 2

## 2015-02-04 MED ORDER — BISACODYL 10 MG RE SUPP: 10.0000 mg | Freq: Every day | RECTAL | Status: DC | PRN

## 2015-02-04 MED ORDER — METHOCARBAMOL 500 MG PO TABS
500.0000 mg | ORAL_TABLET | Freq: Four times a day (QID) | ORAL | Status: DC | PRN
  Administered 2015-02-05 – 2015-02-06 (×3): 500 mg via ORAL
  Filled 2015-02-04 (×3): qty 1

## 2015-02-04 MED ORDER — PROMETHAZINE HCL 25 MG/ML IJ SOLN: 6.2500 mg | Freq: Four times a day (QID) | INTRAMUSCULAR | Status: DC | PRN

## 2015-02-04 MED ORDER — LACTATED RINGERS IV SOLN
INTRAVENOUS | Status: DC | PRN
  Administered 2015-02-04 (×2): via INTRAVENOUS

## 2015-02-04 MED ORDER — PROMETHAZINE HCL 25 MG/ML IJ SOLN
INTRAMUSCULAR | Status: AC
  Filled 2015-02-04: qty 1

## 2015-02-04 MED ORDER — MEPERIDINE HCL 25 MG/ML IJ SOLN: 6.2500 mg | INTRAMUSCULAR | Status: DC | PRN

## 2015-02-04 MED ORDER — CEFAZOLIN SODIUM-DEXTROSE 2-3 GM-% IV SOLR
2.0000 g | Freq: Four times a day (QID) | INTRAVENOUS | Status: AC
  Administered 2015-02-05 (×2): 2 g via INTRAVENOUS
  Filled 2015-02-04 (×2): qty 50

## 2015-02-04 MED ORDER — ONDANSETRON HCL 4 MG PO TABS: 4.0000 mg | ORAL_TABLET | Freq: Four times a day (QID) | ORAL | Status: DC | PRN

## 2015-02-04 MED ORDER — ACETAMINOPHEN 650 MG RE SUPP: 650.0000 mg | Freq: Four times a day (QID) | RECTAL | Status: DC | PRN

## 2015-02-04 MED ORDER — FENTANYL CITRATE 0.05 MG/ML IJ SOLN
25.0000 ug | INTRAMUSCULAR | Status: DC | PRN
  Administered 2015-02-04 (×3): 50 ug via INTRAVENOUS

## 2015-02-04 MED ORDER — MIDAZOLAM HCL 5 MG/5ML IJ SOLN
INTRAMUSCULAR | Status: DC | PRN
  Administered 2015-02-04 (×4): 0.5 mg via INTRAVENOUS
  Administered 2015-02-04: 1 mg via INTRAVENOUS
  Administered 2015-02-04 (×2): 0.5 mg via INTRAVENOUS

## 2015-02-04 MED ORDER — OXYCODONE-ACETAMINOPHEN 5-325 MG PO TABS
1.0000 | ORAL_TABLET | ORAL | Status: DC | PRN
  Administered 2015-02-05 (×3): 2 via ORAL
  Filled 2015-02-04 (×3): qty 2

## 2015-02-04 MED ORDER — 0.9 % SODIUM CHLORIDE (POUR BTL) OPTIME
TOPICAL | Status: DC | PRN
  Administered 2015-02-04: 1000 mL

## 2015-02-04 MED ORDER — OXYCODONE-ACETAMINOPHEN 5-325 MG PO TABS: 1.0000 | ORAL_TABLET | Freq: Four times a day (QID) | ORAL | Status: DC | PRN

## 2015-02-04 MED ORDER — HYDROMORPHONE HCL 1 MG/ML IJ SOLN
0.5000 mg | INTRAMUSCULAR | Status: AC | PRN
  Administered 2015-02-04 (×4): 0.5 mg via INTRAVENOUS

## 2015-02-04 MED ORDER — METHOCARBAMOL 1000 MG/10ML IJ SOLN
500.0000 mg | Freq: Four times a day (QID) | INTRAVENOUS | Status: DC | PRN
  Filled 2015-02-04: qty 5

## 2015-02-04 MED ORDER — CEFAZOLIN SODIUM-DEXTROSE 2-3 GM-% IV SOLR
INTRAVENOUS | Status: DC | PRN
  Administered 2015-02-04: 2 g via INTRAVENOUS

## 2015-02-04 MED ORDER — SODIUM CHLORIDE 0.9 % IV SOLN
INTRAVENOUS | Status: DC
  Administered 2015-02-05: via INTRAVENOUS

## 2015-02-04 MED ORDER — AMLODIPINE BESYLATE 10 MG PO TABS
10.0000 mg | ORAL_TABLET | Freq: Every day | ORAL | Status: DC
  Administered 2015-02-05 – 2015-02-06 (×2): 10 mg via ORAL
  Filled 2015-02-04 (×2): qty 1

## 2015-02-04 MED ORDER — ASPIRIN EC 325 MG PO TBEC
325.0000 mg | DELAYED_RELEASE_TABLET | Freq: Two times a day (BID) | ORAL | Status: DC
  Administered 2015-02-05 – 2015-02-06 (×3): 325 mg via ORAL
  Filled 2015-02-04 (×3): qty 1

## 2015-02-04 MED ORDER — METOPROLOL TARTRATE 25 MG PO TABS
25.0000 mg | ORAL_TABLET | Freq: Every day | ORAL | Status: DC
  Administered 2015-02-05 – 2015-02-06 (×3): 25 mg via ORAL
  Filled 2015-02-04 (×3): qty 1

## 2015-02-04 MED ORDER — BUPIVACAINE HCL (PF) 0.5 % IJ SOLN
INTRAMUSCULAR | Status: DC | PRN
  Administered 2015-02-04: 20 mL

## 2015-02-04 MED ORDER — METHOCARBAMOL 500 MG PO TABS: 500.0000 mg | ORAL_TABLET | Freq: Three times a day (TID) | ORAL | Status: DC | PRN

## 2015-02-04 MED ORDER — ACETAMINOPHEN 325 MG PO TABS: 650.0000 mg | ORAL_TABLET | Freq: Four times a day (QID) | ORAL | Status: DC | PRN

## 2015-02-04 MED ORDER — FENTANYL CITRATE 0.05 MG/ML IJ SOLN
INTRAMUSCULAR | Status: AC
  Filled 2015-02-04: qty 5

## 2015-02-04 MED ORDER — HYDROMORPHONE HCL 1 MG/ML IJ SOLN
INTRAMUSCULAR | Status: AC
  Filled 2015-02-04: qty 1

## 2015-02-04 MED ORDER — ONDANSETRON HCL 4 MG/2ML IJ SOLN
4.0000 mg | Freq: Four times a day (QID) | INTRAMUSCULAR | Status: DC | PRN
  Administered 2015-02-05: 4 mg via INTRAVENOUS
  Filled 2015-02-04: qty 2

## 2015-02-04 MED ORDER — BUPIVACAINE LIPOSOME 1.3 % IJ SUSP
INTRAMUSCULAR | Status: DC | PRN
  Administered 2015-02-04 (×2): 20 mL

## 2015-02-04 MED ORDER — FENTANYL CITRATE 0.05 MG/ML IJ SOLN
INTRAMUSCULAR | Status: DC | PRN
  Administered 2015-02-04: 50 ug via INTRAVENOUS
  Administered 2015-02-04 (×4): 25 ug via INTRAVENOUS
  Administered 2015-02-04: 50 ug via INTRAVENOUS
  Administered 2015-02-04 (×2): 25 ug via INTRAVENOUS

## 2015-02-04 MED ORDER — HYDROMORPHONE HCL 1 MG/ML IJ SOLN
0.5000 mg | INTRAMUSCULAR | Status: DC | PRN
  Administered 2015-02-05: 1 mg via INTRAVENOUS
  Filled 2015-02-04: qty 1

## 2015-02-04 MED ORDER — KETOROLAC TROMETHAMINE 15 MG/ML IJ SOLN
7.5000 mg | Freq: Four times a day (QID) | INTRAMUSCULAR | Status: AC
  Administered 2015-02-05 (×4): 7.5 mg via INTRAVENOUS
  Filled 2015-02-04 (×4): qty 1

## 2015-02-04 MED ORDER — ALUM & MAG HYDROXIDE-SIMETH 200-200-20 MG/5ML PO SUSP
30.0000 mL | ORAL | Status: DC | PRN
  Administered 2015-02-05: 30 mL via ORAL
  Filled 2015-02-04: qty 30

## 2015-02-04 SURGICAL SUPPLY — 68 items
APL SKNCLS STERI-STRIP NONHPOA (GAUZE/BANDAGES/DRESSINGS) ×1
BANDAGE ELASTIC 4 VELCRO ST LF (GAUZE/BANDAGES/DRESSINGS) ×1 IMPLANT
BANDAGE ESMARK 6X9 LF (GAUZE/BANDAGES/DRESSINGS) ×1 IMPLANT
BENZOIN TINCTURE PRP APPL 2/3 (GAUZE/BANDAGES/DRESSINGS) ×2 IMPLANT
BLADE SAGITTAL 25.0X1.19X90 (BLADE) ×2 IMPLANT
BLADE SAW SAG 90X13X1.27 (BLADE) ×2 IMPLANT
BNDG CMPR 9X6 STRL LF SNTH (GAUZE/BANDAGES/DRESSINGS) ×1
BNDG ESMARK 6X9 LF (GAUZE/BANDAGES/DRESSINGS) ×2
BOWL SMART MIX CTS (DISPOSABLE) ×2 IMPLANT
CAPT KNEE PARTIAL 2 ×1 IMPLANT
CEMENT HV SMART SET (Cement) ×3 IMPLANT
CLSR STERI-STRIP ANTIMIC 1/2X4 (GAUZE/BANDAGES/DRESSINGS) ×1 IMPLANT
COVER SURGICAL LIGHT HANDLE (MISCELLANEOUS) ×2 IMPLANT
CUFF TOURNIQUET SINGLE 34IN LL (TOURNIQUET CUFF) ×2 IMPLANT
CUFF TOURNIQUET SINGLE 44IN (TOURNIQUET CUFF) IMPLANT
DRAPE EXTREMITY T 121X128X90 (DRAPE) ×2 IMPLANT
DRAPE IMP U-DRAPE 54X76 (DRAPES) ×2 IMPLANT
DRAPE U-SHAPE 47X51 STRL (DRAPES) ×2 IMPLANT
DRSG MEPILEX BORDER 4X12 (GAUZE/BANDAGES/DRESSINGS) ×2 IMPLANT
DRSG MEPILEX BORDER 4X8 (GAUZE/BANDAGES/DRESSINGS) ×1 IMPLANT
DRSG PAD ABDOMINAL 8X10 ST (GAUZE/BANDAGES/DRESSINGS) ×2 IMPLANT
DURAPREP 26ML APPLICATOR (WOUND CARE) ×2 IMPLANT
ELECT REM PT RETURN 9FT ADLT (ELECTROSURGICAL) ×2
ELECTRODE REM PT RTRN 9FT ADLT (ELECTROSURGICAL) ×1 IMPLANT
EVACUATOR 1/8 PVC DRAIN (DRAIN) ×2 IMPLANT
FACESHIELD WRAPAROUND (MASK) ×2 IMPLANT
FACESHIELD WRAPAROUND OR TEAM (MASK) ×1 IMPLANT
GAUZE SPONGE 4X4 12PLY STRL (GAUZE/BANDAGES/DRESSINGS) ×2 IMPLANT
GLOVE BIOGEL PI IND STRL 8 (GLOVE) ×2 IMPLANT
GLOVE BIOGEL PI INDICATOR 8 (GLOVE) ×2
GLOVE ECLIPSE 7.5 STRL STRAW (GLOVE) ×4 IMPLANT
GOWN STRL REUS W/ TWL LRG LVL3 (GOWN DISPOSABLE) ×1 IMPLANT
GOWN STRL REUS W/ TWL XL LVL3 (GOWN DISPOSABLE) ×2 IMPLANT
GOWN STRL REUS W/TWL LRG LVL3 (GOWN DISPOSABLE) ×4
GOWN STRL REUS W/TWL XL LVL3 (GOWN DISPOSABLE) ×4
HANDPIECE INTERPULSE COAX TIP (DISPOSABLE) ×2
HOOD PEEL AWAY FACE SHEILD DIS (HOOD) ×6 IMPLANT
IMMOBILIZER KNEE 20 (SOFTGOODS) ×2 IMPLANT
IMMOBILIZER KNEE 20 THIGH 36 (SOFTGOODS) IMPLANT
IMMOBILIZER KNEE 22 UNIV (SOFTGOODS) ×2 IMPLANT
KIT BASIN OR (CUSTOM PROCEDURE TRAY) ×2 IMPLANT
KIT ROOM TURNOVER OR (KITS) ×2 IMPLANT
MANIFOLD NEPTUNE II (INSTRUMENTS) ×2 IMPLANT
NDL SPNL 22GX3.5 QUINCKE BK (NEEDLE) ×1 IMPLANT
NEEDLE SPNL 22GX3.5 QUINCKE BK (NEEDLE) ×2 IMPLANT
NS IRRIG 1000ML POUR BTL (IV SOLUTION) ×2 IMPLANT
PACK BLADE SAW RECIP 70 3 PT (BLADE) ×1 IMPLANT
PACK TOTAL JOINT (CUSTOM PROCEDURE TRAY) ×2 IMPLANT
PACK UNIVERSAL I (CUSTOM PROCEDURE TRAY) ×2 IMPLANT
PAD ARMBOARD 7.5X6 YLW CONV (MISCELLANEOUS) ×4 IMPLANT
PAD CAST 4YDX4 CTTN HI CHSV (CAST SUPPLIES) ×1 IMPLANT
PADDING CAST ABS 4INX4YD NS (CAST SUPPLIES) ×1
PADDING CAST ABS COTTON 4X4 ST (CAST SUPPLIES) IMPLANT
PADDING CAST COTTON 4X4 STRL (CAST SUPPLIES) ×2
SET HNDPC FAN SPRY TIP SCT (DISPOSABLE) ×1 IMPLANT
STAPLER VISISTAT 35W (STAPLE) IMPLANT
STRIP CLOSURE SKIN 1/2X4 (GAUZE/BANDAGES/DRESSINGS) ×4 IMPLANT
SUCTION FRAZIER TIP 10 FR DISP (SUCTIONS) ×2 IMPLANT
SUT MNCRL AB 3-0 PS2 18 (SUTURE) IMPLANT
SUT VIC AB 0 CTB1 27 (SUTURE) ×4 IMPLANT
SUT VIC AB 1 CT1 27 (SUTURE) ×4
SUT VIC AB 1 CT1 27XBRD ANBCTR (SUTURE) ×2 IMPLANT
SUT VIC AB 2-0 CTB1 (SUTURE) ×4 IMPLANT
SYR 50ML LL SCALE MARK (SYRINGE) ×2 IMPLANT
TOWEL OR 17X24 6PK STRL BLUE (TOWEL DISPOSABLE) ×2 IMPLANT
TOWEL OR 17X26 10 PK STRL BLUE (TOWEL DISPOSABLE) ×2 IMPLANT
TRAY FOLEY CATH 16FRSI W/METER (SET/KITS/TRAYS/PACK) IMPLANT
WRAP KNEE MAXI GEL POST OP (GAUZE/BANDAGES/DRESSINGS) ×1 IMPLANT

## 2015-02-04 NOTE — Progress Notes (Signed)
D; removed CPM  And applied knee icepack.

## 2015-02-04 NOTE — H&P (Signed)
PREOPERATIVE H&P  Chief Complaint: r knee pain  HPI: Jessica Taylor is a 74 y.o. female who presents for evaluation of r knee pain. It has been present for Several years and has been worsening.The patient is failed physical therapy, activity modification, and use of a cane.  She has taken pain medication and at this point is having minimal relief.  She has light activity pain and has night pain.  She has x-rays showing severe bone-on-bone change in the medial compartment. She has failed conservative measures. Pain is rated as severe.  Past Medical History  Diagnosis Date  . Acid reflux   . Thyroid disease   . 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 Hypercholesteremia   . Hypothyroidism   . Vertigo     when lays on left side.  . Cancer     basal cell on nose  . Anemia   . Coronary artery disease   . Hypertension     Dr. Marlou Porch oversees her cardiac care.   . Arthritis     knees , R shoulder - tx /w injection - 11/2014   Past Surgical History  Procedure Laterality Date  . Coronary stent placement    . Cardiac catheterization  5 stents  . Tubal ligation    . Cataract extraction w/phaco Left 05/24/2014    Procedure: CATARACT EXTRACTION PHACO AND INTRAOCULAR LENS PLACEMENT (IOC);  Surgeon: Tonny Branch, MD;  Location: [facility];  Service: Ophthalmology;  Laterality: Left;  CDE:  9.30  . Cataract extraction w/phaco Right 06/18/2014    Procedure: CATARACT EXTRACTION PHACO AND INTRAOCULAR LENS PLACEMENT RIGHT EYE CDE=10.84;  Surgeon: Tonny Branch, MD;  Location: [facility];  Service: Ophthalmology;  Laterality: Right;  . Eye surgery    . Tonsillectomy      age 12   History   Social History  . Marital Status: Married    Spouse Name: N/A  . Number of Children: N/A  . Years of Education: N/A   Social History Main Topics  . Smoking status: Former Smoker -- 1.50 packs/day for 42 years    Types: Cigarettes    Quit date: 05/19/1999  . Smokeless tobacco: Never Used  . Alcohol Use: No  . Drug Use: No  . Sexual Activity:  Yes    Birth Control/ Protection: Surgical   Other Topics Concern  . Not on file   Social History Narrative   Family History  Problem Relation Age of Onset  . Cancer Mother 10  . Cancer Father 68   Allergies  Allergen Reactions  . Shrimp [Shellfish Allergy]     Throws up violently   Prior to Admission medications   Medication Sig Start Date End Date Taking? Authorizing Provider  amLODipine (NORVASC) 10 MG tablet Take 10 mg by mouth daily before breakfast.    Yes Historical Provider, MD  aspirin EC 81 MG tablet Take 81 mg by mouth daily.   Yes Historical Provider, MD  Fish Oil-Cholecalciferol (FISH OIL + D3 PO) Take 1 capsule by mouth daily.   Yes Historical Provider, MD  HYDROcodone-acetaminophen (NORCO/VICODIN) 5-325 MG per tablet Take 1 tablet by mouth 2 (two) times daily as needed for severe pain.  05/21/14  Yes Historical Provider, MD  Ketotifen Fumarate (THERA TEARS ALLERGY OP) Apply 1 drop to eye 2 (two) times daily as needed (dry eyes).   Yes Historical Provider, MD  levothyroxine (SYNTHROID, LEVOTHROID) 50 MCG tablet Take 50 mcg by mouth daily before breakfast.   Yes Historical Provider, MD  metoprolol tartrate (LOPRESSOR) 25 MG tablet Take 25 mg by mouth daily. 11/02/13  Yes Jerline Pain, MD  NEXIUM 40 MG capsule TAKE ONE CAPSULE BY MOUTH ONCE DAILY AT NOON   Yes Jerline Pain, MD  rosuvastatin (CRESTOR) 20 MG tablet Take 20 mg by mouth daily. Pt takes every other day   Yes Historical Provider, MD  rosuvastatin (CRESTOR) 20 MG tablet Take 1 tablet (20 mg total) by mouth daily. 08/17/14   Jerline Pain, MD     Positive ROS: none   All other systems have been reviewed and were otherwise negative with the exception of those mentioned in the HPI and as above.  Physical Exam: Filed Vitals:   02/04/15 1033  BP: 167/66  Pulse: 70  Temp: 98.1 F (36.7 C)  Resp: 20    General: Alert, no acute distress Cardiovascular: No pedal edema Respiratory: No cyanosis, no use of  accessory musculature GI: No organomegaly, abdomen is soft and non-tender Skin: No lesions in the area of chief complaint Neurologic: Sensation intact distally Psychiatric: Patient is competent for consent with normal mood and affect Lymphatic: No axillary or cervical lymphadenopathy  MUSCULOSKELETAL: Right knee tender palpation over the medial joint line.  There is pain to range of motion.  There is trace effusion.  Is no instability.  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 Assessment/Plan: degenerative joint disease Plan for Procedure(s): UNICOMPARTMENTAL KNEE  The risks benefits and alternatives were discussed with the patient including but not limited to the risks of nonoperative treatment, versus surgical intervention including infection, bleeding, nerve injury, malunion, nonunion, hardware prominence, hardware failure, need for hardware removal, blood clots, cardiopulmonary complications, morbidity, mortality, among others, and they were willing to proceed.  Predicted outcome is good, although there will be at least a six to nine month expected recovery.  Kaliopi Blyden L, MD 02/04/2015 10:54 AM

## 2015-02-04 NOTE — Progress Notes (Addendum)
Pt states rt knee really hurts when extended to 90.  CPM flexion reduced to 60 per Pt request.

## 2015-02-04 NOTE — Brief Op Note (Signed)
02/04/2015  3:41 PM  PATIENT:  [name]  74 y.o. female  PRE-OPERATIVE DIAGNOSIS:  degenerative joint disease  POST-OPERATIVE DIAGNOSIS:  degenerative joint disease  PROCEDURE:  Procedure(s): UNICOMPARTMENTAL KNEE (Right)  SURGEON:  Surgeon(s) and Role:    * Dorna Leitz, MD - Primary  PHYSICIAN ASSISTANT:   ASSISTANTS: bethune   ANESTHESIA:   general  EBL:  Total I/O In: 1500 [I.V.:1500] Out: 100 [Urine:100]  BLOOD ADMINISTERED:none  DRAINS: none   LOCAL MEDICATIONS USED:  OTHER experel   SPECIMEN:  No Specimen  DISPOSITION OF SPECIMEN:  N/A  COUNTS:  YES  TOURNIQUET:   Total Tourniquet Time Documented: Thigh (Right) - 91 minutes Total: Thigh (Right) - 91 minutes   DICTATION: .Other Dictation: Dictation Number [id number]  PLAN OF CARE: Admit to inpatient   PATIENT DISPOSITION:  PACU - hemodynamically stable.   Delay start of Pharmacological VTE agent (>24hrs) due to surgical blood loss or risk of bleeding: no

## 2015-02-04 NOTE — Transfer of Care (Signed)
Immediate Anesthesia Transfer of Care Note  Patient: Jessica Taylor  Procedure(s) Performed: Procedure(s): UNICOMPARTMENTAL KNEE (Right)  Patient Location: PACU  Anesthesia Type:Spinal  Level of Consciousness: awake, oriented, sedated, patient cooperative and responds to stimulation  Airway & Oxygen Therapy: Patient Spontanous Breathing and Patient connected to nasal cannula oxygen  Post-op Assessment: Report given to RN, Post -op Vital signs reviewed and stable, Patient moving all extremities and Patient moving all extremities X 4  Post vital signs: Reviewed and stable  Last Vitals:  Filed Vitals:   02/04/15 1033  BP: 167/66  Pulse: 70  Temp: 36.7 C  Resp: 20    Complications: No apparent anesthesia complications

## 2015-02-04 NOTE — Progress Notes (Signed)
D; not tolerlated pain with CPM, talked to Ortho tech, ok to remove machine now. A; sropped machine will remove at the floor.

## 2015-02-04 NOTE — Progress Notes (Signed)
Orthopedic Tech Progress Note Patient Details:  Jessica Taylor 1941-09-29 [id number]  CPM Right Knee CPM Right Knee: On Right Knee Flexion (Degrees): 90 Right Knee Extension (Degrees): 0 Additional Comments: no overhead frame needed   [name] 02/04/2015, 4:49 PM

## 2015-02-04 NOTE — Anesthesia Postprocedure Evaluation (Signed)
  Anesthesia Post-op Note  Patient: Jessica Taylor  Procedure(s) Performed: Procedure(s): UNICOMPARTMENTAL KNEE (Right)  Patient Location: PACU  Anesthesia Type: Spinal   Level of Consciousness: awake, alert  and oriented  Airway and Oxygen Therapy: Patient Spontanous Breathing  Post-op Pain: mild  Post-op Assessment: Post-op Vital signs reviewed  Post-op Vital Signs: Reviewed  Last Vitals:  Filed Vitals:   02/04/15 1900  BP: 132/74  Pulse: 86  Temp:   Resp: 12    Complications: No apparent anesthesia complications

## 2015-02-04 NOTE — Anesthesia Procedure Notes (Addendum)
Spinal Patient location during procedure: OR Start time: 02/04/2015 1:22 PM End time: 02/04/2015 1:38 PM Staffing Anesthesiologist: Alexis Frock Preanesthetic Checklist Completed: patient identified, site marked, surgical consent, pre-op evaluation, IV checked, risks and benefits discussed and monitors and equipment checked Spinal Block Patient position: sitting Prep: Betadine Patient monitoring: heart rate, cardiac monitor, continuous pulse ox and blood pressure Approach: midline Location: L3-4 Injection technique: single-shot Needle Needle type: Quincke  Needle gauge: 22 G Needle length: 10 cm Assessment Sensory level: T6 Additional Notes Spinal, difficult sitting L2-3, L3-4, no CSF, no parethesia, 24g with introducer.   Placed lateral, re-prepped, 22g L3-L4 para-median approach, CSH free flowing.  Marcaine 13mg  with dextrose injected.  Pt. Was sedated and tolerated multiple sticks very well.    Procedure Name: MAC Date/Time: 02/04/2015 1:29 PM Performed by: Jacquiline Doe A Pre-anesthesia Checklist: Patient identified, Timeout performed, Emergency Drugs available, Suction available and Patient being monitored Patient Re-evaluated:Patient Re-evaluated prior to inductionOxygen Delivery Method: Nasal cannula Intubation Type: IV induction Placement Confirmation: positive ETCO2

## 2015-02-04 NOTE — Progress Notes (Addendum)
May give dilaudid up to 2 mg in pacu. Per Dr Al Corpus

## 2015-02-04 NOTE — Discharge Instructions (Signed)
Total Knee Replacement, Care After °Refer to this sheet in the next few weeks. These instructions provide you with information on caring for yourself after your procedure. Your health care provider also may give you specific instructions. Your treatment has been planned according to the most current medical practices, but problems sometimes occur. Call your health care provider if you have any problems or questions after your procedure. °HOME CARE INSTRUCTIONS  °· See a physical therapist as directed by your health care provider. °· Take medicines only as directed by your health care provider. °· Avoid lifting or driving until you are instructed otherwise. °· If you have been sent home with a continuous passive motion machine, use it as directed by your health care provider. °SEEK MEDICAL CARE IF: °· You have difficulty breathing. °· You have drainage, redness, swelling, or pain at your incision site. °· You have a bad smell coming from your incision site. °· You have persistent bleeding from your incision site. °· Your incision breaks open after sutures (stitches) or staples have been removed. °· You have a fever. °SEEK IMMEDIATE MEDICAL CARE IF:  °· You have a rash. °· You have pain or swelling in your calf or thigh. °· You have shortness of breath or chest pain. °· Your range of motion in your knee is decreasing rather than increasing. °MAKE SURE YOU:  °· Understand these instructions. °· Will watch your condition. °· Will get help right away if you are not doing well or get worse. °Document Released: 05/29/2005 Document Revised: 03/26/2014 Document Reviewed: 12/29/2011 °ExitCare® Patient Information ©2015 [employer]. This information is not intended to replace advice given to you by your health care provider. Make sure you discuss any questions you have with your health care provider. ° °

## 2015-02-05 ENCOUNTER — Encounter (HOSPITAL_COMMUNITY): Payer: Self-pay | Admitting: Orthopedic Surgery

## 2015-02-05 LAB — CBC
HEMATOCRIT: 39.5 % (ref 36.0–46.0)
HEMOGLOBIN: 12.3 g/dL (ref 12.0–15.0)
MCH: 28 pg (ref 26.0–34.0)
MCHC: 31.1 g/dL (ref 30.0–36.0)
MCV: 89.8 fL (ref 78.0–100.0)
Platelets: 152 10*3/uL (ref 150–400)
RBC: 4.4 MIL/uL (ref 3.87–5.11)
RDW: 14.4 % (ref 11.5–15.5)
WBC: 7.3 10*3/uL (ref 4.0–10.5)

## 2015-02-05 MED ORDER — HYDROCODONE-ACETAMINOPHEN 5-325 MG PO TABS
1.0000 | ORAL_TABLET | ORAL | Status: DC | PRN
  Administered 2015-02-05 – 2015-02-06 (×3): 1 via ORAL
  Administered 2015-02-06 (×2): 2 via ORAL
  Filled 2015-02-05: qty 1
  Filled 2015-02-05 (×2): qty 2
  Filled 2015-02-05 (×2): qty 1

## 2015-02-05 NOTE — Evaluation (Signed)
Physical Therapy Evaluation Patient Details Name: Jessica Taylor MRN: [id number] DOB: 1941/01/30 Today's Date: 02/05/2015   History of Present Illness  Jessica Taylor is a 74 y.o. female who presents for evaluation of r knee pain. It has been present for Several years and has been worsening.The patient is failed physical therapy, activity modification, and use of a cane. She has taken pain medication and at this point is having minimal relief. She has x-rays showing severe bone-on-bone change in the medial compartment. She has failed conservative measures.  Pt is s/p unicompartmental knee surgery on 02/04/2015.  Clinical Impression  Functional mobility and ambulation limited this session by lethargy most likely due to  muscle relaxant less than an hour prior to treatment.  Pt unsafe to return home today as pt requiring increased assist and unable to maintain SpO2 > 90% on RA. Recommend another night stay to address mentioned deficits.    Follow Up Recommendations Supervision/Assistance - 24 hour;Supervision for mobility/OOB;Home health PT;Other (comment)     Equipment Recommendations  Rolling walker with 5" wheels    Recommendations for Other Services       Precautions / Restrictions Precautions Precautions: Knee Precaution Booklet Issued: No Precaution Comments: Educated pt on precautions (no pillow under knee) Restrictions Weight Bearing Restrictions: Yes Other Position/Activity Restrictions: WBAT      Mobility  Bed Mobility Overal bed mobility: Needs Assistance Bed Mobility: Supine to Sit     Supine to sit: Supervision     General bed mobility comments: Increased time, use of bed rail, O2 sat dropped to 82-86 when going supine to sit, pt was returned to 2L O2 and O2 remained at 90 while sitting  Transfers Overall transfer level: Needs assistance Equipment used: Rolling walker (2 wheeled) Transfers: Sit to/from Stand Sit to Stand: Min assist         General transfer  comment: Pt tends to forwarde lean and relies heavily on UE  Ambulation/Gait Ambulation/Gait assistance: Min assist Ambulation Distance (Feet): 40 Feet Assistive device: Rolling walker (2 wheeled) Gait Pattern/deviations: Decreased stride length;Step-to pattern Gait velocity: Extremely slow   General Gait Details:  Limited by onset of nausea, needed 2L of O2 during ambulation, demonstrates forward lean and heavy reliance on UE  Stairs            Wheelchair Mobility    Modified Rankin (Stroke Patients Only)       Balance Overall balance assessment: Needs assistance Sitting-balance support: Bilateral upper extremity supported Sitting balance-Leahy Scale: Poor     Standing balance support: Bilateral upper extremity supported Standing balance-Leahy Scale: Poor Standing balance comment: Pt able to ambulate with RW, but demonstrated heavy reliance on B UE for support                             Pertinent Vitals/Pain Pain Assessment: 0-10 Pain Score: 6  Pain Location: R knee Pain Descriptors / Indicators: Aching Pain Intervention(s): Monitored during session    Home Living Family/patient expects to be discharged to:: Private residence Living Arrangements: Spouse/significant other Available Help at Discharge: Family Type of Home: House Home Access: Stairs to enter   Technical brewer of Steps: 3 Home Layout: One level Home Equipment: None      Prior Function Level of Independence: Independent               Hand Dominance        Extremity/Trunk Assessment   Upper Extremity Assessment:  Overall WFL for tasks assessed           Lower Extremity Assessment: RLE deficits/detail RLE Deficits / Details: Unable to lift R leg, minimal quad initiation with quad set    Cervical / Trunk Assessment: Normal (Forward lean with ambulation)  Communication   Communication: No difficulties  Cognition Arousal/Alertness: Lethargic, suspect due to  meds; pt reported taking a muscle relaxant within an hour of starting therapy Behavior During Therapy: [facility] for tasks assessed/performed (With slight lethargy throughout treatment) Overall Cognitive Status: Within Functional Limits for tasks assessed (Slight lethargy noted throughout treatment)                      General Comments General comments (skin integrity, edema, etc.): 2L of O2 necessary at all times throughout treatment    Exercises Total Joint Exercises Ankle Circles/Pumps: 10 reps;AROM;Both;Supine Quad Sets: AROM;5 reps;Supine;Right Goniometric ROM: AROM knee flexion/extension: 15-40 degrees      Assessment/Plan    PT Assessment Patient needs continued PT services  PT Diagnosis Difficulty walking;Generalized weakness;Abnormality of gait   PT Problem List    PT Treatment Interventions Gait training;Stair training;Functional mobility training;Therapeutic activities;Balance training;Therapeutic exercise;DME instruction   PT Goals (Current goals can be found in the Care Plan section) Acute Rehab PT Goals Patient Stated Goal: Return home PT Goal Formulation: With patient/family (daughter) Time For Goal Achievement: 02/19/15 Potential to Achieve Goals: Fair    Frequency Min 7X/week   Barriers to discharge Other (comment) Oxygen stats     Co-evaluation               End of Session Equipment Utilized During Treatment: Gait belt;Oxygen (O2 at 2 L) Activity Tolerance: Patient limited by lethargy Patient left: in bed;with call bell/phone within reach;with family/visitor present (on 2 L O2) Nurse Communication: Mobility status;Other (comment) (O2 stats)         Time: 1010-1045 PT Time Calculation (min) (ACUTE ONLY): 35 min   Charges:    Pteval x1 GTx1     PT G Codes:        Ayline Dingus Feb 07, 2015, 12:55 PM  Lucas Mallow, SPT (student physical therapist) Office phone: [phone]

## 2015-02-05 NOTE — Progress Notes (Signed)
Subjective: 1 Day Post-Op Procedure(s) (LRB): UNICOMPARTMENTAL KNEE (Right) Patient reports pain as moderate.  Not up with physical therapy yet.  Foley out this a.m.   Has not voided yet.  Objective: Vital signs in last 24 hours: Temp:  [97.2 F (36.2 C)-99.5 F (37.5 C)] 99.5 F (37.5 C) (03/15 0534) Pulse Rate:  [58-111] 84 (03/15 0534) Resp:  [11-22] 17 (03/14 2045) BP: (112-167)/(45-113) 112/53 mmHg (03/15 0534) SpO2:  [92 %-100 %] 92 % (03/15 0534) Weight:  [78.019 kg (172 lb)] 78.019 kg (172 lb) (03/14 1033)  Intake/Output from previous day: 03/14 0701 - 03/15 0700 In: 2383.3 [I.V.:2383.3] Out: 700 [Urine:625; Blood:75] Intake/Output this shift:     Recent Labs  02/05/15 0535  HGB 12.3    Recent Labs  02/05/15 0535  WBC 7.3  RBC 4.40  HCT 39.5  PLT 152   No results for input(s): NA, K, CL, CO2, BUN, CREATININE, GLUCOSE, CALCIUM in the last 72 hours. No results for input(s): LABPT, INR in the last 72 hours. Right knee exam: Neurovascular intact Dorsiflexion/Plantar flexion intact Incision: dressing C/D/I Compartment soft  Assessment/Plan: 1 Day Post-Op Procedure(s) (LRB): UNICOMPARTMENTAL KNEE (Right)  Plan: Up with therapy Discharge home with home health after physical therapy. Weight-bear as tolerated on right. Enteric-coated aspirin 325 mg twice daily for DVT prophylaxis. Followup with Dr. Berenice Primas in 2 weeks.  [place] G 02/05/2015, 10:17 AM

## 2015-02-05 NOTE — Progress Notes (Signed)
Utilization review completed. 02/05/15 Set up with [facility] for HHPT by MD office. Spoke with patient, no change in d/c plan.Patient states that she will have assistance at home.Spoke with Ruby Cola from [employer], they are providing CPM, patient already had rolling walker. Patient initially did not want a 3N1, has changed her mind, Ruby Cola asked that I obtain 3N1 from a different vender. Contacted Frank with [employer] and requested 3N1 be delivered to patient's room. No other d/c needs identified.

## 2015-02-05 NOTE — Plan of Care (Signed)
Problem: Consults Goal: Diagnosis- Total Joint Replacement Partial/Uniknee Right

## 2015-02-05 NOTE — Progress Notes (Addendum)
Physical Therapy Treatment Note  Clinical Impression: Pt con't to have nausea and increased pain in R LE. Pt also con't to require 2LO2 via [place] to maintain SpO2 >90% even at rest. Pt is progressing towards goals however remains to have decreased activity tolerance and decreased R knee active ROM and strength. Acute PT to follow.     02/05/15 1510  PT Visit Information  Last PT Received On 02/06/15  Assistance Needed +1  History of Present Illness Jessica Taylor is a 74 y.o. female who presents for evaluation of r knee pain. It has been present for Several years and has been worsening.The patient is failed physical therapy, activity modification, and use of a cane. She has taken pain medication and at this point is having minimal relief. She has x-rays showing severe bone-on-bone change in the medial compartment. She has failed conservative measures.  Pt is s/p unicompartmental knee surgery on 02/04/2015.  PT Time Calculation  PT Start Time (ACUTE ONLY) 1536  PT Stop Time (ACUTE ONLY) 1556  PT Time Calculation (min) (ACUTE ONLY) 23 min  Subjective Data  Subjective "I"m still nauseated"  Patient Stated Goal return home  Precautions  Precautions Knee  Precaution Booklet Issued Yes (comment)  Precaution Comments edu  Restrictions  Weight Bearing Restrictions Yes  Pain Assessment  Pain Assessment 0-10  Pain Score 8  Pain Location R knee  Pain Descriptors / Indicators Aching  Pain Intervention(s) Monitored during session  Cognition  Arousal/Alertness Awake/alert  Behavior During Therapy WFL for tasks assessed/performed  Overall Cognitive Status Within Functional Limits for tasks assessed  Bed Mobility  Overal bed mobility Needs Assistance  Bed Mobility Supine to Sit  Supine to sit Supervision  General bed mobility comments labored effort, defiinte use of bed rails  Transfers  Overall transfer level Needs assistance  Equipment used Rolling walker (2 wheeled)  Transfers Sit to/from  Stand  Sit to Stand Min guard  General transfer comment v/c's for safe hand placement  Ambulation/Gait  Ambulation/Gait assistance Min guard  Ambulation Distance (Feet) 60 Feet  Assistive device Rolling walker (2 wheeled)  Gait Pattern/deviations Step-through pattern;Antalgic  General Gait Details improved fluidity from earlier session, v/c's to decrease UE WBing, minA to maintain smooth continued motion of RW   General Comments  General comments (skin integrity, edema, etc.) con't need for 2LO2 via [place]  Exercises  Exercises Total Joint  Total Joint Exercises  Ankle Circles/Pumps 10 reps;AROM;Both;Supine  Quad Sets AROM;5 reps;Supine;Right  Heel Slides AROM;10 reps;Seated;Right (AA as well to 90 deg flex in sitting with end range stretch)  PT - End of Session  Equipment Utilized During Treatment Gait belt;Oxygen  Activity Tolerance Patient tolerated treatment well  Patient left in chair;with call bell/phone within reach;with family/visitor present  Nurse Communication Mobility status  PT - Assessment/Plan  PT Plan Current plan remains appropriate  PT Frequency (ACUTE ONLY) 7X/week  Follow Up Recommendations Home health PT;Supervision/Assistance - 24 hour  PT equipment None recommended by PT  PT Goal Progression  Progress towards PT goals Progressing toward goals  PT General Charges  $$ ACUTE PT VISIT 1 Procedure  PT Treatments  $Gait Training 8-22 mins  $Therapeutic Exercise  n/a   Kittie Plater, PT, DPT Pager #: [phone] Office #: [phone]

## 2015-02-05 NOTE — Op Note (Signed)
NAMEPEARLEEN, HUNLEY NO.:  [id number]  MEDICAL RECORD NO.:  [id number]  LOCATION:  5N10C                        FACILITY:  [facility]  PHYSICIAN:  Alta Corning, M.D.   DATE OF BIRTH:  [birth year]  DATE OF PROCEDURE:  02/04/2015 DATE OF DISCHARGE:                              OPERATIVE REPORT   PREOPERATIVE DIAGNOSIS:  End-stage degenerative joint disease, right knee.  POSTOPERATIVE DIAGNOSIS:  End-stage degenerative joint disease, right knee.  PROCEDURE:  Right unicompartmental arthroplasty with the Oxford system, size small femur, size 4 bearing, and a type A tibia.  SURGEON:  Alta Corning, M.D.  ASSISTANT:  Gary Fleet, P.A.  ANESTHESIA:  General.  BRIEF HISTORY:  Ms. Benedicto is a 74 year old female with a history of significant complaints of bilateral knee pain, right greater than left. She had severe medial compartment arthritis.  She had no significant patellofemoral issues.  A 20-degree valgus stress x-ray showed no narrowing on the lateral side.  We talked about total knee replacement versus uni, and she was amenable to wanting a uni for less surgery and potentially quicker recovery and after discussion and certainly proving that she was a candidate based on radiographic findings, she was taken to the operating room for this procedure.  DESCRIPTION OF PROCEDURE:  The patient was taken to the operating room. After adequate anesthesia was obtained with general anesthetic, the patient was placed supine on the operating table.  The right leg was then prepped and draped in usual sterile fashion.  Following this, the leg was exsanguinated.  Blood pressure tourniquet inflated to 300 mmHg. Following this, an incision was made essentially in the midline subcutaneous tissue down to the level of the patella, and a medial parapatellar arthrotomy was undertaken.  Small amounts of fat pad were removed.  Attention at this time was turned towards the medial  side where a medial meniscectomy was performed.  Following this, the attention was turned towards osteophyte removal, which was done.  We removed an osteophyte from the medial femur from the notch and at this point, we put in the tibial cutting guide, which was held with a single peg medially.  We used a +2 shim because she had severe wear on both the tibia and the femur.  We thought this was appropriate.  We used the central cutting blade followed by the flat saw blade and removed this medial piece of bone.  Once that was done, attention was turned towards sizing the tibia, looked to size to a size A.  We put in with the A tibia.  We put in between 4 and 5 __________ and at that point, we turned to the femur.  We cut a 0 spigot, checked in extension, had about 2 in extension because she had so much bone loss distally.  We cut 2 more and checked it, I was not really perfectly satisfied.  We cut 1 additional mm and then we put the spigot, then we had a balanced flexion- extension feel.  At that point, we turned towards putting in the trials and trialed with a 4 and a 5.  It was sort of a better feel with a 4  than a 5.  At this point, we cut the anterior notch, posterior osteophytes out, and then put in the toothbrush saw to drill a channel on the tibia.  Once that was completed, the attention was turned towards washing out the tibia, washing out the femur, and we put a bunch of drill holes in the sclerotic bone of the distal femur.  Once that was accomplished, the knee was copiously and thoroughly lavaged, suctioned dry.  The final components were then cemented into place.  Of note, to cut the initial posterior femur, we put the femoral intramedullary alignment guide in and cut the posterior femur with that __________ the flexion gap for balance and then spigoted out the extension gap as we talked about.  At this point, we irrigated and suctioned dry, put cement fixation holes all around  the central peg and then cemented in the final implant size small femur, size A tibial rod, and then we put a 5 paddle in and held that to allow the cement mantle to set up.  We removed all excess bone cement and then we trialed a 4 and felt perfect, trialed a 5, really tight getting in, so I felt that 4 was the appropriate balance device and at this point, we irrigated the wound thoroughly, put the final 4 in, used Exparel all throughout the incisional area.  I did close the medial parapatellar arthrotomy with 1 Vicryl running, skin with 0 and 2-0 Vicryl and 3-0 Monocryl.  Benzoin and Steri-Strips were applied.  Sterile compressive dressing was applied.  The patient was taken to the recovery, she was noted to be in satisfactory condition. Estimated blood loss for the procedure was minimal.     Alta Corning, M.D.     Corliss Skains  D:  02/04/2015  T:  02/05/2015  Job:  [id number]

## 2015-02-06 LAB — CBC
HEMATOCRIT: 35.8 % — AB (ref 36.0–46.0)
Hemoglobin: 11.1 g/dL — ABNORMAL LOW (ref 12.0–15.0)
MCH: 28.2 pg (ref 26.0–34.0)
MCHC: 31 g/dL (ref 30.0–36.0)
MCV: 90.9 fL (ref 78.0–100.0)
Platelets: 130 10*3/uL — ABNORMAL LOW (ref 150–400)
RBC: 3.94 MIL/uL (ref 3.87–5.11)
RDW: 14.8 % (ref 11.5–15.5)
WBC: 8.5 10*3/uL (ref 4.0–10.5)

## 2015-02-06 MED ORDER — HYDROCODONE-ACETAMINOPHEN 5-325 MG PO TABS: 1.0000 | ORAL_TABLET | ORAL | Status: DC | PRN

## 2015-02-06 NOTE — Progress Notes (Signed)
Subjective: 2 Days Post-Op Procedure(s) (LRB): UNICOMPARTMENTAL KNEE (Right) Patient reports pain as moderate. The patient had severe nausea yesterday secondary to Percocet. Her pain medication was changed to hydrocodone with significant improvement. Her O2 sats were difficult to maintain above 90 which may be her baseline. She denies chest pain or shortness of breath. She feels much better this morning.   Objective: Vital signs in last 24 hours: Temp:  [98.4 F (36.9 C)-99.1 F (37.3 C)] 99.1 F (37.3 C) (03/16 0513) Pulse Rate:  [62-91] 91 (03/16 0513) Resp:  [16] 16 (03/16 0513) BP: (105-143)/(47-86) 137/56 mmHg (03/16 0513) SpO2:  [90 %-94 %] 91 % (03/16 0513)  Intake/Output from previous day: 03/15 0701 - 03/16 0700 In: 960 [P.O.:960] Out: -  Intake/Output this shift:     Recent Labs  02/05/15 0535  HGB 12.3    Recent Labs  02/05/15 0535  WBC 7.3  RBC 4.40  HCT 39.5  PLT 152   No results for input(s): NA, K, CL, CO2, BUN, CREATININE, GLUCOSE, CALCIUM in the last 72 hours. No results for input(s): LABPT, INR in the last 72 hours. Right knee exam: Calf is soft. She has significant swelling with a probable hemarthrosis. Range of motion is -5 to 45 of flexion. Neurovascular status is intact distally. Distal pulses are 2+. The wound is benign. No sign of infection.   Assessment/Plan: 2 Days Post-Op Procedure(s) (LRB): UNICOMPARTMENTAL KNEE (Right) Postop nausea. Plan: Dressing removed. We will wrap the knee with an Ace wrap. Discharge home today after physical therapy. Encouraged ice and elevation. She will keep a pillow under her ankle to keep her knee in extension. Follow-up in 10-14 days with Dr. Berenice Primas.    [place] G 02/06/2015, 8:22 AM

## 2015-02-06 NOTE — Progress Notes (Signed)
Physical Therapy Treatment Patient Details Name: Jessica Taylor MRN: [id number] DOB: 1940/12/19 Today's Date: 02/06/2015    History of Present Illness Jessica Taylor is a 74 y.o. female who presents for evaluation of r knee pain. It has been present for Several years and has been worsening.The patient is failed physical therapy, activity modification, and use of a cane. She has taken pain medication and at this point is having minimal relief. She has x-rays showing severe bone-on-bone change in the medial compartment. She has failed conservative measures.  Pt is s/p unicompartmental knee surgery on 02/04/2015.    PT Comments    Pt con't to have decreased R knee active ROM and weakness. Pt with improved ambulation tolerance but unable to achieve reciprocal gait pattern. Pt unable to recall safe stair negotiation, will educated spouse this PM. See vitals section for SpO2 sats during activity. Con't to recommend HHPT.   Follow Up Recommendations  Other (comment);Supervision/Assistance - 24 hour;Home health PT (Portable O2 tank)     Equipment Recommendations  Rolling walker with 5" wheels    Recommendations for Other Services       Precautions / Restrictions Precautions Precautions: Knee Precaution Booklet Issued: Yes (comment) Precaution Comments: No pillow under knee Restrictions Weight Bearing Restrictions: Yes RLE Weight Bearing: Weight bearing as tolerated    Mobility  Bed Mobility Overal bed mobility: Needs Assistance Bed Mobility: Supine to Sit     Supine to sit: Supervision     General bed mobility comments: increased time, labored effort, definite use of bed rails  Transfers Overall transfer level: Needs assistance Equipment used: Rolling walker (2 wheeled) Transfers: Sit to/from Stand Sit to Stand: Min guard         General transfer comment: still requires v/c's for safe hand placement  Ambulation/Gait Ambulation/Gait assistance: Min guard Ambulation  Distance (Feet): 200 Feet Assistive device: Rolling walker (2 wheeled) Gait Pattern/deviations: Step-to pattern Gait velocity: Extremely slow   General Gait Details: Demonstrated improved distance, but extremely slow and labored, increased use of UE, decreased WB on R leg, minA to maintain forward progression of RW to attempt fluid reciprocal gait pattern   Stairs Stairs: Yes Stairs assistance: Min assist Stair Management: Forwards;Backwards;No rails;With walker Number of Stairs: 1 (platfrom step) General stair comments: pt with poor carryover of technique. will educate family  Wheelchair Mobility    Modified Rankin (Stroke Patients Only)       Balance             Standing balance-Leahy Scale: Poor Standing balance comment: pt con't to require use of RW for safe standing/walking                    Cognition Arousal/Alertness: Awake/alert Behavior During Therapy: WFL for tasks assessed/performed Overall Cognitive Status: Within Functional Limits for tasks assessed       Memory: Decreased short-term memory (Unable to recall stair ambulation after just being shown)              Exercises Total Joint Exercises Ankle Circles/Pumps: AROM;10 reps;Right;Left;Supine Heel Slides: 5 reps;Right;AROM    General Comments        Pertinent Vitals/Pain Pain Assessment: 0-10 Pain Score: 5  Pain Location: R knee Pain Descriptors / Indicators: Aching Pain Intervention(s): Monitored during session   SATURATION QUALIFICATIONS: (This note is used to comply with regulatory documentation for home oxygen)  Patient Saturations on Room Air at Rest = 82-86%  Patient Saturations on Room Air while Ambulating = 86-88%  Patient Saturations on 2 Liters of oxygen while Ambulating = 90-92%  Please briefly explain why patient needs home oxygen:pt unable to maintain SpO2 > 88% on RA     Home Living                      Prior Function            PT Goals  (current goals can now be found in the care plan section) Acute Rehab PT Goals Patient Stated Goal: return home Progress towards PT goals: Progressing toward goals    Frequency  7X/week    PT Plan Equipment recommendations need to be updated    Co-evaluation             End of Session Equipment Utilized During Treatment: Gait belt;Oxygen Activity Tolerance: Patient tolerated treatment well;Other (comment) (Still needed 2L of O2 at rest and with functional activity) Patient left: in bed;with call bell/phone within reach     Time: 0917-0954 PT Time Calculation (min) (ACUTE ONLY): 37 min  Charges:  $Gait Training: 23-37 mins                    G Codes:      Jessica Taylor 02/06/2015, 1:57 PM  Jessica Taylor, PT, DPT Pager #: [phone] Office #: [phone]

## 2015-02-06 NOTE — Progress Notes (Signed)
Patient provided with discharge instructions and follow up information.Pt going home at this time with spouse at bedside. Directions giving regarding dressing changes and signs/symptoms of infection. [facility] has been set up.

## 2015-02-06 NOTE — Progress Notes (Signed)
Physical Therapy Treatment Note  Clinical Impression: Pt seen for stair negotiation/education with spouse.  Pt demo'd proper technique for safe stair navigation after 3 attempts.    02/06/15 1350  PT Visit Information  Last PT Received On 02/06/15  Assistance Needed +1  History of Present Illness Jessica Taylor is a 74 y.o. female who presents for evaluation of r knee pain. It has been present for Several years and has been worsening.The patient is failed physical therapy, activity modification, and use of a cane. She has taken pain medication and at this point is having minimal relief. She has x-rays showing severe bone-on-bone change in the medial compartment. She has failed conservative measures.  Pt is s/p unicompartmental knee surgery on 02/04/2015.  PT Time Calculation  PT Start Time (ACUTE ONLY) 1350  PT Stop Time (ACUTE ONLY) 1405  PT Time Calculation (min) (ACUTE ONLY) 15 min  Subjective Data  Subjective "ready to go home"  Patient Stated Goal return home  Precautions  Precautions Knee  Precaution Booklet Issued Yes (comment)  Precaution Comments No pillow under knee  Restrictions  Weight Bearing Restrictions Yes  RLE Weight Bearing WBAT  Pain Assessment  Pain Assessment Faces  Faces Pain Scale 4  Pain Location R knee  Pain Descriptors / Indicators Sore  Pain Intervention(s) Monitored during session  Cognition  Arousal/Alertness Awake/alert  Behavior During Therapy WFL for tasks assessed/performed  Overall Cognitive Status Within Functional Limits for tasks assessed  Bed Mobility  Overal bed mobility (Received sitting in chair)  Transfers  Overall transfer level Needs assistance  Equipment used Rolling walker (2 wheeled)  Transfers Sit to/from Stand  Sit to Stand Supervision  Ambulation/Gait  Ambulation/Gait assistance Supervision  Ambulation Distance (Feet) 20 Feet  Assistive device Rolling walker (2 wheeled)  Gait Pattern/deviations Step-to pattern;Decreased  stride length;Decreased weight shift to right  Gait velocity Slow  General Gait Details Only ambulated within room  Stairs Yes  Stairs assistance Min guard  Stair Management Forwards;Backwards;No rails;With walker  Number of Stairs 1 ((platform step))  General stair comments Husband present for stair training, pt still demonstrated poor carryover of technique, but less v/c needed this afternoon  Balance  Overall balance assessment Needs assistance  Standing balance support Bilateral upper extremity supported  Standing balance-Leahy Scale Poor  Standing balance comment Still relies heavily on UE during ambulation  General Comments  General comments (skin integrity, edema, etc.) Tolerated treatment with no O2, O2 stats not monitored due to discharge status  Exercises  Exercises Total Joint  Total Joint Exercises  Long Arc Quad 10 reps;Right;AROM  Knee Flexion Seated;AROM;10 reps;Right  PT - End of Session  Equipment Utilized During Treatment Gait belt  Activity Tolerance Patient tolerated treatment well  Patient left in chair;with call bell/phone within reach  Nurse Communication (O2 stats/discharge plans)  PT - Assessment/Plan  PT Plan Equipment recommendations need to be updated  PT Frequency (ACUTE ONLY) 7X/week  Follow Up Recommendations Supervision/Assistance - 24 hour;Home health PT  PT equipment Rolling walker with 5" wheels  PT Goal Progression  Progress towards PT goals Progressing toward goals  PT General Charges  $$ ACUTE PT VISIT 1 Procedure  PT Treatments  $Gait Training 8-22 mins   Lucas Mallow, SPT (student physical therapist) Office phone: [phone]

## 2015-02-08 ENCOUNTER — Other Ambulatory Visit (HOSPITAL_COMMUNITY): Payer: Self-pay

## 2015-02-08 ENCOUNTER — Inpatient Hospital Stay (HOSPITAL_COMMUNITY)
Admission: EM | Admit: 2015-02-08 | Discharge: 2015-02-12 | DRG: 206 | Disposition: A | Discharge status: Home / Self Care | Payer: Medicare Other | Attending: Internal Medicine | Admitting: Internal Medicine

## 2015-02-08 ENCOUNTER — Emergency Department (HOSPITAL_COMMUNITY): Payer: Medicare Other

## 2015-02-08 ENCOUNTER — Encounter (HOSPITAL_COMMUNITY): Payer: Self-pay | Admitting: *Deleted

## 2015-02-08 DIAGNOSIS — E782 Mixed hyperlipidemia: Secondary | ICD-10-CM | POA: Diagnosis present

## 2015-02-08 DIAGNOSIS — R7989 Other specified abnormal findings of blood chemistry: Secondary | ICD-10-CM

## 2015-02-08 DIAGNOSIS — R0902 Hypoxemia: Secondary | ICD-10-CM | POA: Diagnosis present

## 2015-02-08 DIAGNOSIS — J9811 Atelectasis: Secondary | ICD-10-CM | POA: Diagnosis present

## 2015-02-08 DIAGNOSIS — Z9841 Cataract extraction status, right eye: Secondary | ICD-10-CM

## 2015-02-08 DIAGNOSIS — K210 Gastro-esophageal reflux disease with esophagitis: Secondary | ICD-10-CM

## 2015-02-08 DIAGNOSIS — E039 Hypothyroidism, unspecified: Secondary | ICD-10-CM | POA: Diagnosis present

## 2015-02-08 DIAGNOSIS — Z96651 Presence of right artificial knee joint: Secondary | ICD-10-CM | POA: Diagnosis present

## 2015-02-08 DIAGNOSIS — E038 Other specified hypothyroidism: Secondary | ICD-10-CM

## 2015-02-08 DIAGNOSIS — Z7982 Long term (current) use of aspirin: Secondary | ICD-10-CM | POA: Diagnosis not present

## 2015-02-08 DIAGNOSIS — R079 Chest pain, unspecified: Secondary | ICD-10-CM | POA: Diagnosis not present

## 2015-02-08 DIAGNOSIS — Z961 Presence of intraocular lens: Secondary | ICD-10-CM | POA: Diagnosis present

## 2015-02-08 DIAGNOSIS — Z9861 Coronary angioplasty status: Secondary | ICD-10-CM

## 2015-02-08 DIAGNOSIS — D649 Anemia, unspecified: Secondary | ICD-10-CM | POA: Diagnosis present

## 2015-02-08 DIAGNOSIS — K5900 Constipation, unspecified: Secondary | ICD-10-CM | POA: Diagnosis present

## 2015-02-08 DIAGNOSIS — M1711 Unilateral primary osteoarthritis, right knee: Secondary | ICD-10-CM | POA: Diagnosis present

## 2015-02-08 DIAGNOSIS — Z85828 Personal history of other malignant neoplasm of skin: Secondary | ICD-10-CM | POA: Diagnosis not present

## 2015-02-08 DIAGNOSIS — Z87891 Personal history of nicotine dependence: Secondary | ICD-10-CM | POA: Diagnosis not present

## 2015-02-08 DIAGNOSIS — M1990 Unspecified osteoarthritis, unspecified site: Secondary | ICD-10-CM | POA: Diagnosis present

## 2015-02-08 DIAGNOSIS — Z9842 Cataract extraction status, left eye: Secondary | ICD-10-CM | POA: Diagnosis not present

## 2015-02-08 DIAGNOSIS — Z955 Presence of coronary angioplasty implant and graft: Secondary | ICD-10-CM

## 2015-02-08 DIAGNOSIS — I10 Essential (primary) hypertension: Secondary | ICD-10-CM | POA: Diagnosis present

## 2015-02-08 DIAGNOSIS — K219 Gastro-esophageal reflux disease without esophagitis: Secondary | ICD-10-CM | POA: Diagnosis present

## 2015-02-08 DIAGNOSIS — I2510 Atherosclerotic heart disease of native coronary artery without angina pectoris: Secondary | ICD-10-CM | POA: Diagnosis present

## 2015-02-08 DIAGNOSIS — R0600 Dyspnea, unspecified: Secondary | ICD-10-CM | POA: Diagnosis not present

## 2015-02-08 DIAGNOSIS — R778 Other specified abnormalities of plasma proteins: Secondary | ICD-10-CM

## 2015-02-08 DIAGNOSIS — I252 Old myocardial infarction: Secondary | ICD-10-CM | POA: Diagnosis not present

## 2015-02-08 LAB — CBC WITH DIFFERENTIAL/PLATELET
BASOS PCT: 0 % (ref 0–1)
Basophils Absolute: 0 10*3/uL (ref 0.0–0.1)
EOS ABS: 0.1 10*3/uL (ref 0.0–0.7)
Eosinophils Relative: 2 % (ref 0–5)
HEMATOCRIT: 38.2 % (ref 36.0–46.0)
HEMOGLOBIN: 12.2 g/dL (ref 12.0–15.0)
LYMPHS ABS: 1 10*3/uL (ref 0.7–4.0)
Lymphocytes Relative: 13 % (ref 12–46)
MCH: 28.4 pg (ref 26.0–34.0)
MCHC: 31.9 g/dL (ref 30.0–36.0)
MCV: 88.8 fL (ref 78.0–100.0)
MONO ABS: 0.4 10*3/uL (ref 0.1–1.0)
Monocytes Relative: 5 % (ref 3–12)
NEUTROS PCT: 80 % — AB (ref 43–77)
Neutro Abs: 6.1 10*3/uL (ref 1.7–7.7)
Platelets: 193 10*3/uL (ref 150–400)
RBC: 4.3 MIL/uL (ref 3.87–5.11)
RDW: 14.4 % (ref 11.5–15.5)
WBC: 7.6 10*3/uL (ref 4.0–10.5)

## 2015-02-08 LAB — COMPREHENSIVE METABOLIC PANEL
ALBUMIN: 3.3 g/dL — AB (ref 3.5–5.2)
ALK PHOS: 67 U/L (ref 39–117)
ALT: 11 U/L (ref 0–35)
AST: 15 U/L (ref 0–37)
Anion gap: 7 (ref 5–15)
BILIRUBIN TOTAL: 0.8 mg/dL (ref 0.3–1.2)
BUN: 11 mg/dL (ref 6–23)
CHLORIDE: 103 mmol/L (ref 96–112)
CO2: 29 mmol/L (ref 19–32)
Calcium: 9.6 mg/dL (ref 8.4–10.5)
Creatinine, Ser: 1 mg/dL (ref 0.50–1.10)
GFR calc Af Amer: 62 mL/min — ABNORMAL LOW (ref >90)
GFR calc non Af Amer: 54 mL/min — ABNORMAL LOW (ref >90)
Glucose, Bld: 106 mg/dL — ABNORMAL HIGH (ref 70–99)
POTASSIUM: 4.2 mmol/L (ref 3.5–5.1)
Sodium: 139 mmol/L (ref 135–145)
Total Protein: 6.4 g/dL (ref 6.0–8.3)

## 2015-02-08 LAB — I-STAT TROPONIN, ED: Troponin i, poc: 0.01 ng/mL (ref 0.00–0.08)

## 2015-02-08 MED ORDER — LEVOTHYROXINE SODIUM 50 MCG PO TABS
50.0000 ug | ORAL_TABLET | Freq: Every day | ORAL | Status: DC
  Administered 2015-02-09 – 2015-02-11 (×3): 50 ug via ORAL
  Filled 2015-02-08 (×5): qty 1

## 2015-02-08 MED ORDER — IOHEXOL 350 MG/ML SOLN
100.0000 mL | Freq: Once | INTRAVENOUS | Status: AC | PRN
  Administered 2015-02-08: 100 mL via INTRAVENOUS

## 2015-02-08 MED ORDER — FISH OIL + D3 1200-1000 MG-UNIT PO CAPS: 1.0000 | ORAL_CAPSULE | Freq: Every day | ORAL | Status: DC

## 2015-02-08 MED ORDER — ONDANSETRON HCL 4 MG/2ML IJ SOLN: 4.0000 mg | Freq: Three times a day (TID) | INTRAMUSCULAR | Status: DC | PRN

## 2015-02-08 MED ORDER — KETOTIFEN FUMARATE 0.025 % OP SOLN: 1.0000 [drp] | Freq: Two times a day (BID) | OPHTHALMIC | Status: DC | PRN

## 2015-02-08 MED ORDER — SODIUM CHLORIDE 0.9 % IJ SOLN
3.0000 mL | Freq: Two times a day (BID) | INTRAMUSCULAR | Status: DC
  Administered 2015-02-08 – 2015-02-11 (×7): 3 mL via INTRAVENOUS

## 2015-02-08 MED ORDER — METHOCARBAMOL 500 MG PO TABS
500.0000 mg | ORAL_TABLET | Freq: Three times a day (TID) | ORAL | Status: DC | PRN
  Administered 2015-02-11: 500 mg via ORAL
  Filled 2015-02-08 (×3): qty 1

## 2015-02-08 MED ORDER — HYDROCODONE-ACETAMINOPHEN 5-325 MG PO TABS
1.0000 | ORAL_TABLET | ORAL | Status: DC | PRN
  Administered 2015-02-08: 2 via ORAL
  Administered 2015-02-09 – 2015-02-10 (×5): 1 via ORAL
  Administered 2015-02-10: 2 via ORAL
  Administered 2015-02-11 (×3): 1 via ORAL
  Administered 2015-02-11: 2 via ORAL
  Administered 2015-02-12: 1 via ORAL
  Filled 2015-02-08 (×3): qty 1
  Filled 2015-02-08: qty 2
  Filled 2015-02-08 (×2): qty 1
  Filled 2015-02-08 (×2): qty 2
  Filled 2015-02-08 (×4): qty 1
  Filled 2015-02-08: qty 2

## 2015-02-08 MED ORDER — ROSUVASTATIN CALCIUM 20 MG PO TABS
20.0000 mg | ORAL_TABLET | Freq: Every day | ORAL | Status: DC
  Administered 2015-02-09 – 2015-02-11 (×3): 20 mg via ORAL
  Filled 2015-02-08 (×4): qty 1

## 2015-02-08 MED ORDER — ASPIRIN EC 325 MG PO TBEC
325.0000 mg | DELAYED_RELEASE_TABLET | Freq: Two times a day (BID) | ORAL | Status: DC
  Administered 2015-02-09 – 2015-02-11 (×6): 325 mg via ORAL
  Filled 2015-02-08 (×10): qty 1

## 2015-02-08 MED ORDER — ONDANSETRON HCL 4 MG/2ML IJ SOLN
4.0000 mg | Freq: Once | INTRAMUSCULAR | Status: AC
  Administered 2015-02-08: 4 mg via INTRAVENOUS
  Filled 2015-02-08: qty 2

## 2015-02-08 MED ORDER — HEPARIN SODIUM (PORCINE) 5000 UNIT/ML IJ SOLN
5000.0000 [IU] | Freq: Three times a day (TID) | INTRAMUSCULAR | Status: DC
  Administered 2015-02-09 – 2015-02-12 (×9): 5000 [IU] via SUBCUTANEOUS
  Filled 2015-02-08 (×16): qty 1

## 2015-02-08 MED ORDER — PANTOPRAZOLE SODIUM 40 MG PO TBEC
40.0000 mg | DELAYED_RELEASE_TABLET | Freq: Every day | ORAL | Status: DC
  Administered 2015-02-09 – 2015-02-11 (×3): 40 mg via ORAL
  Filled 2015-02-08 (×2): qty 1

## 2015-02-08 MED ORDER — AMLODIPINE BESYLATE 10 MG PO TABS
10.0000 mg | ORAL_TABLET | Freq: Every day | ORAL | Status: DC
  Administered 2015-02-09 – 2015-02-11 (×3): 10 mg via ORAL
  Filled 2015-02-08 (×5): qty 1

## 2015-02-08 MED ORDER — MORPHINE SULFATE 2 MG/ML IJ SOLN: 2.0000 mg | INTRAMUSCULAR | Status: DC | PRN

## 2015-02-08 MED ORDER — METOPROLOL TARTRATE 25 MG PO TABS
25.0000 mg | ORAL_TABLET | Freq: Every day | ORAL | Status: DC
  Administered 2015-02-09 – 2015-02-11 (×3): 25 mg via ORAL
  Filled 2015-02-08 (×4): qty 1

## 2015-02-08 MED ORDER — VITAMIN D3 25 MCG (1000 UNIT) PO TABS
1000.0000 [IU] | ORAL_TABLET | Freq: Every day | ORAL | Status: DC
  Administered 2015-02-09 – 2015-02-11 (×3): 1000 [IU] via ORAL
  Filled 2015-02-08 (×4): qty 1

## 2015-02-08 MED ORDER — OMEGA-3-ACID ETHYL ESTERS 1 G PO CAPS
1.0000 g | ORAL_CAPSULE | Freq: Every day | ORAL | Status: DC
  Administered 2015-02-09 – 2015-02-11 (×3): 1 g via ORAL
  Filled 2015-02-08 (×4): qty 1

## 2015-02-08 NOTE — ED Notes (Signed)
Dr Niu at bedside 

## 2015-02-08 NOTE — ED Notes (Signed)
The  Pt has a partial rt lnee replaced and wass discharged yesterday. While she was in the hosp her 02 sats were low and they have stayed low.  She feels fine and she is not visibly sob  She has been checking her sats at home.

## 2015-02-08 NOTE — ED Notes (Signed)
Informed PA of low O2 of 81% on RA. Put pt. On 3L [place] 94% now

## 2015-02-08 NOTE — ED Provider Notes (Signed)
CSN: [id number]     Arrival date & time 02/08/15  1611 History   First MD Initiated Contact with Patient 02/08/15 1931     Chief Complaint  Patient presents with  . low o2 sats      (Consider location/radiation/quality/duration/timing/severity/associated sxs/prior Treatment) The history is provided by the patient and medical records.    This is a 74 y.o. F with PMH significant for hypothyroidism, vertigo, CAD, hypertension, hypercholesterolemia, presenting to the ED for low O2 sats.  Patient recently underwent a partial right knee replacement by Dr. Berenice Primas was discharged from the hospital yesterday. She states while in hospital she had multiple episodes of low O2 sats requiring supplemental oxygen. She states throughout the day today her oxygen has been low, lowest reading was 83%. She states she does not recall it getting above 90% at all today.  Patient denies feeling SOB or chest pain.  No cough, fever, chills, sweats.  States knee has been healing well, normal post-op pain.  Patient not currently on anti-coagulation aside from ASA.  No prior hx of DVT or PE.  Husband does admit she has not been using her incentive spirometer as much as she should.  VSS.  Past Medical History  Diagnosis Date  . Acid reflux   . Thyroid disease   . 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 Hypercholesteremia   . Hypothyroidism   . Vertigo     when lays on left side.  . Cancer     basal cell on nose  . Anemia   . Coronary artery disease   . Hypertension     Dr. Marlou Porch oversees her cardiac care.   . Arthritis     knees , R shoulder - tx /w injection - 11/2014   Past Surgical History  Procedure Laterality Date  . Coronary stent placement    . Cardiac catheterization  5 stents  . Tubal ligation    . Cataract extraction w/phaco Left 05/24/2014    Procedure: CATARACT EXTRACTION PHACO AND INTRAOCULAR LENS PLACEMENT (IOC);  Surgeon: Tonny Branch, MD;  Location: [facility];  Service: Ophthalmology;  Laterality: Left;  CDE:  9.30  .  Cataract extraction w/phaco Right 06/18/2014    Procedure: CATARACT EXTRACTION PHACO AND INTRAOCULAR LENS PLACEMENT RIGHT EYE CDE=10.84;  Surgeon: Tonny Branch, MD;  Location: [facility];  Service: Ophthalmology;  Laterality: Right;  . Eye surgery    . Tonsillectomy      age 28  . Partial knee arthroplasty Right 02/04/2015    Procedure: UNICOMPARTMENTAL KNEE;  Surgeon: Dorna Leitz, MD;  Location: [facility];  Service: Orthopedics;  Laterality: Right;   Family History  Problem Relation Age of Onset  . Cancer Mother 38  . Cancer Father 76   History  Substance Use Topics  . Smoking status: Former Smoker -- 1.50 packs/day for 42 years    Types: Cigarettes    Quit date: 05/19/1999  . Smokeless tobacco: Never Used  . Alcohol Use: No   OB History    No data available     Review of Systems  Respiratory:       Low O2 sats  All other systems reviewed and are negative.     Allergies  Shrimp  Home Medications   Prior to Admission medications   Medication Sig Start Date End Date Taking? Authorizing Provider  amLODipine (NORVASC) 10 MG tablet Take 10 mg by mouth daily before breakfast.    Yes Historical Provider, MD  aspirin EC 325 MG tablet Take  1 tablet (325 mg total) by mouth 2 (two) times daily after a meal. Take x 1 month post op to decrease risk of blood clots. 02/04/15  Yes Gary Fleet, PA-C  Fish Oil-Cholecalciferol (FISH OIL + D3 PO) Take 1 capsule by mouth daily.   Yes Historical Provider, MD  HYDROcodone-acetaminophen (NORCO/VICODIN) 5-325 MG per tablet Take 1-2 tablets by mouth every 4 (four) hours as needed for severe pain. 02/06/15  Yes Gary Fleet, PA-C  Ketotifen Fumarate (THERA TEARS ALLERGY OP) Apply 1 drop to eye 2 (two) times daily as needed (dry eyes).   Yes Historical Provider, MD  levothyroxine (SYNTHROID, LEVOTHROID) 50 MCG tablet Take 50 mcg by mouth daily before breakfast.   Yes Historical Provider, MD  methocarbamol (ROBAXIN) 500 MG tablet Take 1 tablet (500 mg total)  by mouth every 8 (eight) hours as needed for muscle spasms. 02/04/15  Yes Gary Fleet, PA-C  metoprolol tartrate (LOPRESSOR) 25 MG tablet Take 25 mg by mouth daily. 11/02/13  Yes Jerline Pain, MD  NEXIUM 40 MG capsule TAKE ONE CAPSULE BY MOUTH ONCE DAILY AT NOON   Yes Jerline Pain, MD  rosuvastatin (CRESTOR) 20 MG tablet Take 1 tablet (20 mg total) by mouth daily. 08/17/14  Yes Jerline Pain, MD  oxyCODONE-acetaminophen (PERCOCET/ROXICET) 5-325 MG per tablet Take 1-2 tablets by mouth every 6 (six) hours as needed for severe pain. Patient not taking: Reported on 02/08/2015 02/04/15   Gary Fleet, PA-C   BP 136/55 mmHg  Pulse 83  Temp(Src) 98.2 F (36.8 C) (Oral)  Resp 16  Ht 5\' 4"  (1.626 m)  Wt 172 lb (78.019 kg)  BMI 29.51 kg/m2  SpO2 95%   Physical Exam  Constitutional: She is oriented to person, place, and time. She appears well-developed and well-nourished. No distress.  HENT:  Head: Normocephalic and atraumatic.  Mouth/Throat: Oropharynx is clear and moist.  Eyes: Conjunctivae and EOM are normal. Pupils are equal, round, and reactive to light.  Neck: Normal range of motion. Neck supple.  Cardiovascular: Normal rate, regular rhythm and normal heart sounds.   Pulmonary/Chest: Effort normal and breath sounds normal. No respiratory distress. She has no wheezes.  Abdominal: Soft. Bowel sounds are normal. There is no tenderness. There is no guarding.  Musculoskeletal: Normal range of motion.  Right knee mildly swollen and tender along medial joint line as expect; wound appears clean without signs of infection; leg remains NVI  Neurological: She is alert and oriented to person, place, and time.  Skin: Skin is warm and dry. She is not diaphoretic.  Psychiatric: She has a normal mood and affect.  Nursing note and vitals reviewed.   ED Course  Procedures (including critical care time) Labs Review Labs Reviewed  CBC WITH DIFFERENTIAL/PLATELET - Abnormal; Notable for the following:     Neutrophils Relative % 80 (*)    All other components within normal limits  COMPREHENSIVE METABOLIC PANEL - Abnormal; Notable for the following:    Glucose, Bld 106 (*)    Albumin 3.3 (*)    GFR calc non Af Amer 54 (*)    GFR calc Af Amer 62 (*)    All other components within normal limits  Randolm Idol, ED    Imaging Review Dg Chest 2 View  02/08/2015   CLINICAL DATA:  74 year old female status post recent right knee surgery. Increase shortness of breath, fever, weakness. Initial encounter.  EXAM: CHEST  2 VIEW  COMPARISON:  12/28/2014.  FINDINGS: Seated AP and lateral  views of the chest. Lower lung volumes. Stable mild cardiomegaly. Other mediastinal contours are within normal limits. Visualized tracheal air column is within normal limits. No pneumothorax or pulmonary edema. There is increased patchy opacity at the left lung base. No definite pleural effusion. No other confluent pulmonary opacity. Calcified atherosclerosis of the aorta. No acute osseous abnormality identified.  IMPRESSION: Lower lung volumes with patchy opacity at the left lung base suspicious for developing pneumonia in this setting. No pleural effusion identified.   Electronically Signed   By: Genevie Ann M.D.   On: 02/08/2015 17:34   Ct Angio Chest Pe W/cm &/or Wo Cm  02/08/2015   CLINICAL DATA:  Knee surgery 02/04/15, has been de-sating since then, denies chest pain. Low oxygen saturation.  EXAM: CT ANGIOGRAPHY CHEST WITH CONTRAST  TECHNIQUE: Multidetector CT imaging of the chest was performed using the standard protocol during bolus administration of intravenous contrast. Multiplanar CT image reconstructions and MIPs were obtained to evaluate the vascular anatomy.  CONTRAST:  133mL OMNIPAQUE IOHEXOL 350 MG/ML SOLN  COMPARISON:  Current chest radiograph.  FINDINGS: There is no evidence of a pulmonary embolus. Heart is top-normal in size. There are dense coronary artery calcifications. The great vessels are normal in caliber.  No aortic dissection. No neck base, axillary, mediastinal or hilar masses or pathologically enlarged lymph nodes.  Reticular opacities are noted at the lung bases and in the dependent lower lobes consistent with atelectasis. No lung consolidation to suggest pneumonia. No pulmonary edema. There is no pleural effusion or pneumothorax.  Small sliding hiatal hernia. Limited evaluation of the upper abdomen is otherwise unremarkable.  Bony thorax is demineralized. Mild degenerative changes noted along the thoracic spine. No osteoblastic or osteolytic lesions.  Review of the MIP images confirms the above findings.  IMPRESSION: 1. No evidence of a pulmonary embolus. 2. Subsegmental atelectasis noted in the dependent lower lobes and at the lung bases. No evidence of pneumonia or pulmonary edema.   Electronically Signed   By: Lajean Manes M.D.   On: 02/08/2015 20:53     EKG Interpretation None      MDM   Final diagnoses:  Hypoxia  Status post right partial knee replacement   74 year old female status post partial right total knee replacement that was discharged from hospital yesterday, here with hypoxia. States while in the hospital her O2 sats were low she was discharged home with incentive spirometer and pulse ox. Throughout the day today sats have been less than 90%.  On exam, patient is in no acute respiratory distress. She is afebrile and nontoxic in appearance. Her knee appears to be healing well without signs of infection. Lab work was obtained in triage and is overall reassuring. Chest x-ray with questionable pneumonia. Given her recent orthopedic surgery, patient has high risk for PE, this CT imaging was obtained which is negative for PE, no evidence of pneumonia.  Attempted to ambulate patient, O2 sats dropped to 81% just moving to side of bed.  Patient placed on 3L and immediately corrected back to 94%.  Patient with hypoxia and new O2 requirement immediately post-operatively, she will need  admission.  Case discussed with Dr. Blaine Hamper who will admit for further management.  Temp admission orders placed.  VSS at this time.  Larene Pickett, PA-C 02/08/15 2313  Tanna Furry, MD 02/09/15 2217

## 2015-02-08 NOTE — H&P (Signed)
[facility] History and Physical  MARIJEAN DUHON [id number] DOB: 1941-06-18 DOA: 02/08/2015  Referring physician: ED physician PCP: [name], MD  Specialists:   Chief Complaint: Oxygen desaturation  HPI: Jessica Taylor is a 74 y.o. female with past medical history of GERD, hyperlipidemia, hypothyroidism, hypotension, coronary artery disease (post status of stent placement), history of basal cell skin cancer, arthritis, recent right partial knee replacement, hx of heavy smoking (smoked 2 PAD for more than 30 years and quit on 1990), who presents with oxygen desaturation.  Patient was recently hospitalized from 02/04/15 to 02/07/15 and underwent a partial right knee replacement by Dr. Berenice Primas. She was just discharged home from the hospital yesterday. She states while in hospital she had multiple episodes of low O2 sats requiring supplemental oxygen. She states throughout the day today, her oxygen has been low, lowest reading was 83%. Patient does not have fever, chills, cough, shortness of breath, chest pain. No tenderness over the calf areas. Patient is not currently on anti-coagulation aside from ASA.  No prior hx of DVT or PE. Patient denies abdominal pain, diarrhea, constipation, dysuria, urgency, frequency, hematuria, skin rashes or leg swelling. No unilateral weakness, numbness or tingling sensations. No vision change or hearing loss.  In ED, she had CTA of the chest, which was negative for pulmonary embolism and infiltration, though x-ray showed possible left lower lobe obesity. WBC 7.6, temperature normal, no tachycardia. Electrolytes okay. Troponin negative. EKG showed first degree AV block which did not exist on EKG at 05/18/14.  Review of Systems: As presented in the history of presenting illness, rest negative.  Where does patient live?  At home \\Can  patient participate in ADLs? some  Allergy:  Allergies  Allergen Reactions  . Shrimp [Shellfish Allergy]     Throws up  violently    Past Medical History  Diagnosis Date  . Acid reflux   . Thyroid disease   . 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 Hypercholesteremia   . Hypothyroidism   . Vertigo     when lays on left side.  . Cancer     basal cell on nose  . Anemia   . Coronary artery disease   . Hypertension     Dr. Marlou Porch oversees her cardiac care.   . Arthritis     knees , R shoulder - tx /w injection - 11/2014    Past Surgical History  Procedure Laterality Date  . Coronary stent placement    . Cardiac catheterization  5 stents  . Tubal ligation    . Cataract extraction w/phaco Left 05/24/2014    Procedure: CATARACT EXTRACTION PHACO AND INTRAOCULAR LENS PLACEMENT (IOC);  Surgeon: Tonny Branch, MD;  Location: [facility];  Service: Ophthalmology;  Laterality: Left;  CDE:  9.30  . Cataract extraction w/phaco Right 06/18/2014    Procedure: CATARACT EXTRACTION PHACO AND INTRAOCULAR LENS PLACEMENT RIGHT EYE CDE=10.84;  Surgeon: Tonny Branch, MD;  Location: [facility];  Service: Ophthalmology;  Laterality: Right;  . Eye surgery    . Tonsillectomy      age 32  . Partial knee arthroplasty Right 02/04/2015    Procedure: UNICOMPARTMENTAL KNEE;  Surgeon: Dorna Leitz, MD;  Location: [place];  Service: Orthopedics;  Laterality: Right;    Social History:  reports that she quit smoking about 15 years ago. Her smoking use included Cigarettes. She has a 63 pack-year smoking history. She has never used smokeless tobacco. She reports that she does not drink alcohol or use illicit drugs.  Family History:  Family History  Problem Relation Age of Onset  . Cancer Mother 40  . Cancer Father 74     Prior to Admission medications   Medication Sig Start Date End Date Taking? Authorizing Provider  amLODipine (NORVASC) 10 MG tablet Take 10 mg by mouth daily before breakfast.    Yes Historical Provider, MD  aspirin EC 325 MG tablet Take 1 tablet (325 mg total) by mouth 2 (two) times daily after a meal. Take x 1 month post op to decrease risk of blood  clots. 02/04/15  Yes Gary Fleet, PA-C  Fish Oil-Cholecalciferol (FISH OIL + D3 PO) Take 1 capsule by mouth daily.   Yes Historical Provider, MD  HYDROcodone-acetaminophen (NORCO/VICODIN) 5-325 MG per tablet Take 1-2 tablets by mouth every 4 (four) hours as needed for severe pain. 02/06/15  Yes Gary Fleet, PA-C  Ketotifen Fumarate (THERA TEARS ALLERGY OP) Apply 1 drop to eye 2 (two) times daily as needed (dry eyes).   Yes Historical Provider, MD  levothyroxine (SYNTHROID, LEVOTHROID) 50 MCG tablet Take 50 mcg by mouth daily before breakfast.   Yes Historical Provider, MD  methocarbamol (ROBAXIN) 500 MG tablet Take 1 tablet (500 mg total) by mouth every 8 (eight) hours as needed for muscle spasms. 02/04/15  Yes Gary Fleet, PA-C  metoprolol tartrate (LOPRESSOR) 25 MG tablet Take 25 mg by mouth daily. 11/02/13  Yes Jerline Pain, MD  NEXIUM 40 MG capsule TAKE ONE CAPSULE BY MOUTH ONCE DAILY AT NOON   Yes Jerline Pain, MD  rosuvastatin (CRESTOR) 20 MG tablet Take 1 tablet (20 mg total) by mouth daily. 08/17/14  Yes Jerline Pain, MD  oxyCODONE-acetaminophen (PERCOCET/ROXICET) 5-325 MG per tablet Take 1-2 tablets by mouth every 6 (six) hours as needed for severe pain. Patient not taking: Reported on 02/08/2015 02/04/15   Gary Fleet, PA-C    Physical Exam: Filed Vitals:   02/08/15 1819 02/08/15 2015 02/08/15 2115 02/08/15 2117  BP: 136/55 152/69 168/73   Pulse: 83 83 94 81  Temp: 98.2 F (36.8 C)     TempSrc: Oral     Resp: 16 15 20 11   Height:      Weight:      SpO2: 95% 95% 81% 94%   General: Not in acute distress HEENT:       Eyes: PERRL, EOMI, no scleral icterus       ENT: No discharge from the ears and nose, no pharynx injection, no tonsillar enlargement.        Neck: No JVD, no bruit, no mass felt. Cardiac: [id number], RRR, 1/6 systolic murmurs, No gallops or rubs Pulm: Good air movement bilaterally. Clear to auscultation bilaterally. No rales, wheezing, rhonchi or rubs. Abd: Soft,  nondistended, nontender, no rebound pain, no organomegaly, BS present Ext: No leg edema bilaterally. 2+DP/PT pulse bilaterally Musculoskeletal: Right knee mildly swollen and tender along medial joint line as expect; wound appears clean without signs of infection. Skin: No rashes.  Neuro: Alert and oriented X3, cranial nerves II-XII grossly intact, muscle strength 5/5 in all extremeties, sensation to light touch intact.  Psych: Patient is not psychotic, no suicidal or hemocidal ideation.  Labs on Admission:  Basic Metabolic Panel:  Recent Labs Lab 02/08/15 1644  NA 139  K 4.2  CL 103  CO2 29  GLUCOSE 106*  BUN 11  CREATININE 1.00  CALCIUM 9.6   Liver Function Tests:  Recent Labs Lab 02/08/15 1644  AST 15  ALT 11  ALKPHOS 67  BILITOT 0.8  PROT 6.4  ALBUMIN 3.3*   No results for input(s): LIPASE, AMYLASE in the last 168 hours. No results for input(s): AMMONIA in the last 168 hours. CBC:  Recent Labs Lab 02/05/15 0535 02/06/15 0725 02/08/15 1644  WBC 7.3 8.5 7.6  NEUTROABS  --   --  6.1  HGB 12.3 11.1* 12.2  HCT 39.5 35.8* 38.2  MCV 89.8 90.9 88.8  PLT 152 130* 193   Cardiac Enzymes: No results for input(s): CKTOTAL, CKMB, CKMBINDEX, TROPONINI in the last 168 hours.  BNP (last 3 results) No results for input(s): BNP in the last 8760 hours.  ProBNP (last 3 results) No results for input(s): PROBNP in the last 8760 hours.  CBG: No results for input(s): GLUCAP in the last 168 hours.  Radiological Exams on Admission: Dg Chest 2 View  02/08/2015   CLINICAL DATA:  74 year old female status post recent right knee surgery. Increase shortness of breath, fever, weakness. Initial encounter.  EXAM: CHEST  2 VIEW  COMPARISON:  12/28/2014.  FINDINGS: Seated AP and lateral views of the chest. Lower lung volumes. Stable mild cardiomegaly. Other mediastinal contours are within normal limits. Visualized tracheal air column is within normal limits. No pneumothorax or pulmonary  edema. There is increased patchy opacity at the left lung base. No definite pleural effusion. No other confluent pulmonary opacity. Calcified atherosclerosis of the aorta. No acute osseous abnormality identified.  IMPRESSION: Lower lung volumes with patchy opacity at the left lung base suspicious for developing pneumonia in this setting. No pleural effusion identified.   Electronically Signed   By: Genevie Ann M.D.   On: 02/08/2015 17:34   Ct Angio Chest Pe W/cm &/or Wo Cm  02/08/2015   CLINICAL DATA:  Knee surgery 02/04/15, has been de-sating since then, denies chest pain. Low oxygen saturation.  EXAM: CT ANGIOGRAPHY CHEST WITH CONTRAST  TECHNIQUE: Multidetector CT imaging of the chest was performed using the standard protocol during bolus administration of intravenous contrast. Multiplanar CT image reconstructions and MIPs were obtained to evaluate the vascular anatomy.  CONTRAST:  162mL OMNIPAQUE IOHEXOL 350 MG/ML SOLN  COMPARISON:  Current chest radiograph.  FINDINGS: There is no evidence of a pulmonary embolus. Heart is top-normal in size. There are dense coronary artery calcifications. The great vessels are normal in caliber. No aortic dissection. No neck base, axillary, mediastinal or hilar masses or pathologically enlarged lymph nodes.  Reticular opacities are noted at the lung bases and in the dependent lower lobes consistent with atelectasis. No lung consolidation to suggest pneumonia. No pulmonary edema. There is no pleural effusion or pneumothorax.  Small sliding hiatal hernia. Limited evaluation of the upper abdomen is otherwise unremarkable.  Bony thorax is demineralized. Mild degenerative changes noted along the thoracic spine. No osteoblastic or osteolytic lesions.  Review of the MIP images confirms the above findings.  IMPRESSION: 1. No evidence of a pulmonary embolus. 2. Subsegmental atelectasis noted in the dependent lower lobes and at the lung bases. No evidence of pneumonia or pulmonary edema.    Electronically Signed   By: Lajean Manes M.D.   On: 02/08/2015 20:53    EKG: Independently reviewed.  first degree AV block which did not exist on EKG at 05/18/14.  Assessment/Plan Principal Problem:   Hypoxia Active Problems:   Mixed hyperlipidemia   Essential hypertension, benign   Primary osteoarthritis of right knee   GERD (gastroesophageal reflux disease)   CAD (coronary artery disease)   Hypothyroidism  Hypoxia:  Patient has asymptomatic oxygen desaturation. Chest x-ray showed possible left lower lobe infiltration, but CTA did not show infiltration. Clinically no signs of pneumonia. I will not start antibiotics. The etiology is not clear for her hypoxia. Pulmonary embolism was ruled out by negative CTA. Differential diagnosis include atelectasis as evidence by CTA (subsegmental atelectasis noted in the dependent lower lobes and at the lung bases), undiagnosed COPD given her all history of heavy smoking (less likely, given her lung is clear by auscultation), and congestive heart failure (less likely, given no leg edema and no pulmonary edema on chest x-ray). -will admit to tele bed -nasal cannula oxygen supply - Incentive spirometry -Check troponin 3, BNP. If BNP elevated, will get 2d echo  CAD: s/p of stent placement. Currently no chest pain. EKG has no ischemic change, but showed first degree AV block. -Continue home medications: Aspirin, Crestor, metoprolol -Check A1c and fasting lipid profile  Hypertension:  -continue amlodipine  Hypothyroidism: No TSH on record. Patient is on Synthroid at home. -Continue Synthroid -check TSH  GERD: -Protonix  Recent right knee partial replacement: Patient still has tenderness over right knee -Pain control: Vicodin and morpine   DVT ppx: SQ Heparin      Code Status: Full code Family Communication:  Yes, patient's  husband     at bed side Disposition Plan: Admit to inpatient   Date of Service 02/08/2015    Ivor Costa [facility] Pager [phone]  If 7PM-7AM, please contact night-coverage [email or web address] Password [facility] 02/08/2015, 9:51 PM

## 2015-02-08 NOTE — ED Notes (Signed)
Report attempted 

## 2015-02-08 NOTE — ED Notes (Signed)
Pt. Getting admitted. Disregard check pulse ox while ambulating

## 2015-02-09 DIAGNOSIS — I2510 Atherosclerotic heart disease of native coronary artery without angina pectoris: Secondary | ICD-10-CM

## 2015-02-09 LAB — CBC
HEMATOCRIT: 35.3 % — AB (ref 36.0–46.0)
Hemoglobin: 11.1 g/dL — ABNORMAL LOW (ref 12.0–15.0)
MCH: 28.2 pg (ref 26.0–34.0)
MCHC: 31.4 g/dL (ref 30.0–36.0)
MCV: 89.8 fL (ref 78.0–100.0)
Platelets: 178 10*3/uL (ref 150–400)
RBC: 3.93 MIL/uL (ref 3.87–5.11)
RDW: 14.4 % (ref 11.5–15.5)
WBC: 5.3 10*3/uL (ref 4.0–10.5)

## 2015-02-09 LAB — BASIC METABOLIC PANEL
Anion gap: 6 (ref 5–15)
BUN: 10 mg/dL (ref 6–23)
CHLORIDE: 102 mmol/L (ref 96–112)
CO2: 29 mmol/L (ref 19–32)
Calcium: 9.2 mg/dL (ref 8.4–10.5)
Creatinine, Ser: 0.96 mg/dL (ref 0.50–1.10)
GFR calc Af Amer: 65 mL/min — ABNORMAL LOW (ref >90)
GFR calc non Af Amer: 56 mL/min — ABNORMAL LOW (ref >90)
GLUCOSE: 93 mg/dL (ref 70–99)
POTASSIUM: 3.7 mmol/L (ref 3.5–5.1)
Sodium: 137 mmol/L (ref 135–145)

## 2015-02-09 LAB — LIPID PANEL
CHOL/HDL RATIO: 4.2 ratio
Cholesterol: 138 mg/dL (ref 0–200)
HDL: 33 mg/dL — ABNORMAL LOW (ref >39)
LDL Cholesterol: 76 mg/dL (ref 0–99)
Triglycerides: 146 mg/dL (ref <150)
VLDL: 29 mg/dL (ref 0–40)

## 2015-02-09 LAB — PROTIME-INR
INR: 1.1 (ref 0.00–1.49)
PROTHROMBIN TIME: 14.3 s (ref 11.6–15.2)

## 2015-02-09 LAB — TROPONIN I
TROPONIN I: 0.03 ng/mL (ref <0.031)
TROPONIN I: 0.08 ng/mL — AB (ref <0.031)
Troponin I: 0.04 ng/mL — ABNORMAL HIGH (ref <0.031)

## 2015-02-09 LAB — TSH: TSH: 1.921 u[IU]/mL (ref 0.350–4.500)

## 2015-02-09 LAB — BRAIN NATRIURETIC PEPTIDE: B NATRIURETIC PEPTIDE 5: 67.3 pg/mL (ref 0.0–100.0)

## 2015-02-09 LAB — GLUCOSE, CAPILLARY: Glucose-Capillary: 88 mg/dL (ref 70–99)

## 2015-02-09 MED ORDER — POLYETHYLENE GLYCOL 3350 17 G PO PACK
17.0000 g | PACK | Freq: Every day | ORAL | Status: DC
  Administered 2015-02-09 – 2015-02-10 (×2): 17 g via ORAL
  Filled 2015-02-09 (×4): qty 1

## 2015-02-09 MED ORDER — SENNOSIDES-DOCUSATE SODIUM 8.6-50 MG PO TABS
2.0000 | ORAL_TABLET | Freq: Two times a day (BID) | ORAL | Status: DC
  Administered 2015-02-09 – 2015-02-11 (×5): 2 via ORAL
  Filled 2015-02-09 (×5): qty 2

## 2015-02-09 NOTE — Progress Notes (Signed)
Orthopedic Tech Progress Note Patient Details:  Jessica Taylor 1941/10/24 [id number] Put on cpm at 10:30 pm. Patient ID: Jessica Taylor, female   DOB: Nov 07, 1941, 74 y.o.   MRN: [id number]   [name] 02/09/2015, 10:26 PM

## 2015-02-09 NOTE — Progress Notes (Signed)
Orthopedic Tech Progress Note Patient Details:  Jessica Taylor 1941-06-03 [id number]  CPM Right Knee CPM Right Knee: On Right Knee Flexion (Degrees): 60 Right Knee Extension (Degrees): 0 Additional Comments: trapeze bar patient helper Viewed order from doctor's order list  Hildred Priest 02/09/2015, 12:26 PM

## 2015-02-09 NOTE — Progress Notes (Signed)
[facility] PROGRESS NOTE  Jessica Taylor [id number] DOB: 12-14-1940 DOA: 02/08/2015 PCP: [name], MD Interim summary: 74 year old lady with h/o recent right knee replacement admitted for hypoxia of unclear etiology.  Assessment/Plan: Hypoxia : Admitted to telemetry. Unclear etiology, no evidence of pulmonary embolism, edema or pneumonia. There is evidence of subsegmental atelectasis. Incentive spirometry and venous duplex ordered to evaluate for DVT.  She is currently not wheezing and doenst appear to have copd exacerbation.  Echocardiogram ordered to evaluate for CHF. , though no pulm edema on CT or much pedal edema.  Initial troponin negative, but second troponin is positive. EKG  Reviewed and does not show any ischemic changes. NSR.     Mild anemia: Normocytic.   Constipation: Stool softeners ordered.    CAD: No chest pain, resume aspirin, BB, crestor.   Hypothyroidism: tsh within normal limits.  Resume synthroid.      Code Status: full code.  Family Communication: multiple family members at bedside Disposition Plan: pending.    Consultants:  none  Procedures:  none  Antibiotics:  none  HPI/Subjective: Feeling better than yesterday.   Objective: Filed Vitals:   02/09/15 1324  BP: 140/70  Pulse: 64  Temp: 98.6 F (37 C)  Resp:     Intake/Output Summary (Last 24 hours) at 02/09/15 1710 Last data filed at 02/09/15 0743  Gross per 24 hour  Intake      0 ml  Output    300 ml  Net   -300 ml   Filed Weights   02/08/15 1619 02/08/15 2332  Weight: 78.019 kg (172 lb) 80.695 kg (177 lb 14.4 oz)    Exam:   General:  Alert afebrile on 3 lit [place] oxygen. Comfortable not in any distress  Cardiovascular: s1s2 RRR  Respiratory: diminished breath sounds at bases, no wheezing or rhonchi  Abdomen: softt non tender non distended bowel sounds heard  Musculoskeletal: no pedal edema. , ace wrap around the right knee.   Data  Reviewed: Basic Metabolic Panel:  Recent Labs Lab 02/08/15 1644 02/09/15 0521  NA 139 137  K 4.2 3.7  CL 103 102  CO2 29 29  GLUCOSE 106* 93  BUN 11 10  CREATININE 1.00 0.96  CALCIUM 9.6 9.2   Liver Function Tests:  Recent Labs Lab 02/08/15 1644  AST 15  ALT 11  ALKPHOS 67  BILITOT 0.8  PROT 6.4  ALBUMIN 3.3*   No results for input(s): LIPASE, AMYLASE in the last 168 hours. No results for input(s): AMMONIA in the last 168 hours. CBC:  Recent Labs Lab 02/05/15 0535 02/06/15 0725 02/08/15 1644 02/09/15 0521  WBC 7.3 8.5 7.6 5.3  NEUTROABS  --   --  6.1  --   HGB 12.3 11.1* 12.2 11.1*  HCT 39.5 35.8* 38.2 35.3*  MCV 89.8 90.9 88.8 89.8  PLT 152 130* 193 178   Cardiac Enzymes:  Recent Labs Lab 02/08/15 2337 02/09/15 0521  TROPONINI 0.03 0.04*   BNP (last 3 results)  Recent Labs  02/08/15 2337  BNP 67.3    ProBNP (last 3 results) No results for input(s): PROBNP in the last 8760 hours.  CBG:  Recent Labs Lab 02/09/15 0737  GLUCAP 88    No results found for this or any previous visit (from the past 240 hour(s)).   Studies: Dg Chest 2 View  02/08/2015   CLINICAL DATA:  74 year old female status post recent right knee surgery. Increase shortness of breath, fever, weakness. Initial  encounter.  EXAM: CHEST  2 VIEW  COMPARISON:  12/28/2014.  FINDINGS: Seated AP and lateral views of the chest. Lower lung volumes. Stable mild cardiomegaly. Other mediastinal contours are within normal limits. Visualized tracheal air column is within normal limits. No pneumothorax or pulmonary edema. There is increased patchy opacity at the left lung base. No definite pleural effusion. No other confluent pulmonary opacity. Calcified atherosclerosis of the aorta. No acute osseous abnormality identified.  IMPRESSION: Lower lung volumes with patchy opacity at the left lung base suspicious for developing pneumonia in this setting. No pleural effusion identified.    Electronically Signed   By: Genevie Ann M.D.   On: 02/08/2015 17:34   Ct Angio Chest Pe W/cm &/or Wo Cm  02/08/2015   CLINICAL DATA:  Knee surgery 02/04/15, has been de-sating since then, denies chest pain. Low oxygen saturation.  EXAM: CT ANGIOGRAPHY CHEST WITH CONTRAST  TECHNIQUE: Multidetector CT imaging of the chest was performed using the standard protocol during bolus administration of intravenous contrast. Multiplanar CT image reconstructions and MIPs were obtained to evaluate the vascular anatomy.  CONTRAST:  143mL OMNIPAQUE IOHEXOL 350 MG/ML SOLN  COMPARISON:  Current chest radiograph.  FINDINGS: There is no evidence of a pulmonary embolus. Heart is top-normal in size. There are dense coronary artery calcifications. The great vessels are normal in caliber. No aortic dissection. No neck base, axillary, mediastinal or hilar masses or pathologically enlarged lymph nodes.  Reticular opacities are noted at the lung bases and in the dependent lower lobes consistent with atelectasis. No lung consolidation to suggest pneumonia. No pulmonary edema. There is no pleural effusion or pneumothorax.  Small sliding hiatal hernia. Limited evaluation of the upper abdomen is otherwise unremarkable.  Bony thorax is demineralized. Mild degenerative changes noted along the thoracic spine. No osteoblastic or osteolytic lesions.  Review of the MIP images confirms the above findings.  IMPRESSION: 1. No evidence of a pulmonary embolus. 2. Subsegmental atelectasis noted in the dependent lower lobes and at the lung bases. No evidence of pneumonia or pulmonary edema.   Electronically Signed   By: Lajean Manes M.D.   On: 02/08/2015 20:53    Scheduled Meds: . amLODipine  10 mg Oral QAC breakfast  . aspirin EC  325 mg Oral BID PC  . omega-3 acid ethyl esters  1 g Oral Daily   And  . cholecalciferol  1,000 Units Oral Daily  . heparin  5,000 Units Subcutaneous 3 times per day  . levothyroxine  50 mcg Oral QAC breakfast  .  metoprolol tartrate  25 mg Oral Daily  . pantoprazole  40 mg Oral Daily  . polyethylene glycol  17 g Oral Daily  . rosuvastatin  20 mg Oral Daily  . senna-docusate  2 tablet Oral BID  . sodium chloride  3 mL Intravenous Q12H   Continuous Infusions:   Principal Problem:   Hypoxia Active Problems:   Mixed hyperlipidemia   Essential hypertension, benign   Primary osteoarthritis of right knee   GERD (gastroesophageal reflux disease)   CAD (coronary artery disease)   Hypothyroidism    Time spent: 25 min    [place] [facility] Pager [phone]. If 7PM-7AM, please contact night-coverage at [email or web address], password [facility] 02/09/2015, 5:10 PM  LOS: 1 day

## 2015-02-09 NOTE — Progress Notes (Signed)
PT Cancellation Note  Patient Details Name: Jessica Taylor MRN: [id number] DOB: 03-20-1941   Cancelled Treatment:    Reason Eval/Treat Not Completed: Medical issues which prohibited therapy Most recent lab report indicates slightly elevated troponin level. Will hold formal PT evaluation per departmental guidelines until troponin demonstrates downward trend, or is cleared by MD.  Ellouise Newer 02/09/2015, 8:06 AM Elayne Snare, [place]

## 2015-02-09 NOTE — Progress Notes (Signed)
Orthopedic Tech Progress Note Patient Details:  Jessica Taylor 1941-01-28 [id number]  Patient ID: Jessica Taylor, female   DOB: 1941-09-08, 74 y.o.   MRN: [id number] Placed pt's rle on cpm @0 -60 degrees; will increase as pt tolerates  Hildred Priest 02/09/2015, 12:26 PM

## 2015-02-10 DIAGNOSIS — R778 Other specified abnormalities of plasma proteins: Secondary | ICD-10-CM

## 2015-02-10 DIAGNOSIS — R7989 Other specified abnormal findings of blood chemistry: Secondary | ICD-10-CM

## 2015-02-10 LAB — GLUCOSE, CAPILLARY: Glucose-Capillary: 119 mg/dL — ABNORMAL HIGH (ref 70–99)

## 2015-02-10 NOTE — Consult Note (Signed)
Consulting cardiologist: Dr Carlyle Dolly MD  Clinical Summary [name] is a 74 y.o.female hx of HL, hypothyroidism, CAD with prior stents, tobacco abuse, and recent knee replacement admitted with low oxygen saturations by home monitor in the low 80%s. This was apparently also noted during recent admission for knee surgery. She denies any SOB, no chest pain, no orthopnea, no PND, no LE edema. She is asymptomatic during these episodes.   - CT PE no PE, subsegmental atelectasis in lower lobes. No pneumonia or pulmonary edema - CXR low lung volumes, patchy opacity left lung base - echo pending - Cr 1, K 4.2, Hgb 12.2, Plt 193, BNP 67, TSH 1.9, trop 0.03 to 0.04 to 0.08 - EKG NSR no ischemic changes   Allergies  Allergen Reactions  . Shrimp [Shellfish Allergy]     Throws up violently    Medications Scheduled Medications: . amLODipine  10 mg Oral QAC breakfast  . aspirin EC  325 mg Oral BID PC  . omega-3 acid ethyl esters  1 g Oral Daily   And  . cholecalciferol  1,000 Units Oral Daily  . heparin  5,000 Units Subcutaneous 3 times per day  . levothyroxine  50 mcg Oral QAC breakfast  . metoprolol tartrate  25 mg Oral Daily  . pantoprazole  40 mg Oral Daily  . polyethylene glycol  17 g Oral Daily  . rosuvastatin  20 mg Oral Daily  . senna-docusate  2 tablet Oral BID  . sodium chloride  3 mL Intravenous Q12H     Infusions:     PRN Medications:  HYDROcodone-acetaminophen, ketotifen, methocarbamol, morphine injection, ondansetron (ZOFRAN) IV   Past Medical History  Diagnosis Date  . Acid reflux   . Thyroid disease   . 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 Hypercholesteremia   . Hypothyroidism   . Vertigo     when lays on left side.  . Cancer     basal cell on nose  . Anemia   . Coronary artery disease   . Hypertension     Dr. Marlou Porch oversees her cardiac care.   . Arthritis     knees , R shoulder - tx /w injection - 11/2014    Past Surgical History  Procedure Laterality Date   . Coronary stent placement    . Cardiac catheterization  5 stents  . Tubal ligation    . Cataract extraction w/phaco Left 05/24/2014    Procedure: CATARACT EXTRACTION PHACO AND INTRAOCULAR LENS PLACEMENT (IOC);  Surgeon: Tonny Branch, MD;  Location: [facility];  Service: Ophthalmology;  Laterality: Left;  CDE:  9.30  . Cataract extraction w/phaco Right 06/18/2014    Procedure: CATARACT EXTRACTION PHACO AND INTRAOCULAR LENS PLACEMENT RIGHT EYE CDE=10.84;  Surgeon: Tonny Branch, MD;  Location: [facility];  Service: Ophthalmology;  Laterality: Right;  . Eye surgery    . Tonsillectomy      age 18  . Partial knee arthroplasty Right 02/04/2015    Procedure: UNICOMPARTMENTAL KNEE;  Surgeon: Dorna Leitz, MD;  Location: [place];  Service: Orthopedics;  Laterality: Right;    Family History  Problem Relation Age of Onset  . Cancer Mother 28  . Cancer Father 93    Social History [name] reports that she quit smoking about 15 years ago. Her smoking use included Cigarettes. She has a 63 pack-year smoking history. She has never used smokeless tobacco. [name] reports that she does not drink alcohol.  Review of Systems CONSTITUTIONAL: No weight  loss, fever, chills, weakness or fatigue.  HEENT: Eyes: No visual loss, blurred vision, double vision or yellow sclerae. No hearing loss, sneezing, congestion, runny nose or sore throat.  SKIN: No rash or itching.  CARDIOVASCULAR: No chest pain, chest pressure or chest discomfort. No palpitations or edema.  RESPIRATORY: No shortness of breath, cough or sputum.  GASTROINTESTINAL: No anorexia, nausea, vomiting or diarrhea. No abdominal pain or blood.  GENITOURINARY: no polyuria, no dysuria NEUROLOGICAL: No headache, dizziness, syncope, paralysis, ataxia, numbness or tingling in the extremities. No change in bowel or bladder control.  MUSCULOSKELETAL: knee pain  HEMATOLOGIC: No anemia, bleeding or bruising.  LYMPHATICS: No enlarged nodes. No history of splenectomy.    PSYCHIATRIC: No history of depression or anxiety.      Physical Examination Blood pressure 134/51, pulse 64, temperature 98.5 F (36.9 C), temperature source Oral, resp. rate 18, height 5\' 4"  (1.626 m), weight 171 lb 1.2 oz (77.6 kg), SpO2 94 %.  Intake/Output Summary (Last 24 hours) at 02/10/15 1357 Last data filed at 02/10/15 1231  Gross per 24 hour  Intake   1110 ml  Output    700 ml  Net    410 ml    HEENT: sclera clear  Cardiovascular: RRR, no m/r/g, no JVD, no carotid bruits  Respiratory: CTAB  GI: abdomen soft, NT, ND  MSK: no LE edema  Neuro: no focal deficits     Lab Results  Basic Metabolic Panel:  Recent Labs Lab 02/08/15 1644 02/09/15 0521  NA 139 137  K 4.2 3.7  CL 103 102  CO2 29 29  GLUCOSE 106* 93  BUN 11 10  CREATININE 1.00 0.96  CALCIUM 9.6 9.2    Liver Function Tests:  Recent Labs Lab 02/08/15 1644  AST 15  ALT 11  ALKPHOS 67  BILITOT 0.8  PROT 6.4  ALBUMIN 3.3*    CBC:  Recent Labs Lab 02/05/15 0535 02/06/15 0725 02/08/15 1644 02/09/15 0521  WBC 7.3 8.5 7.6 5.3  NEUTROABS  --   --  6.1  --   HGB 12.3 11.1* 12.2 11.1*  HCT 39.5 35.8* 38.2 35.3*  MCV 89.8 90.9 88.8 89.8  PLT 152 130* 193 178    Cardiac Enzymes:  Recent Labs Lab 02/08/15 2337 02/09/15 0521 02/09/15 1215  TROPONINI 0.03 0.04* 0.08*    BNP: Invalid input(s): POCBNP    Impression/Recommendations 1. Hypoxia - seems to be episodic and asymptomatic, ongoing since recent knee surgery. No clear cause at this time. Negative CT PE, though did have some atelectasis. Mild troponin elevation of unclear significance. EKG without specific ischemic changes, she has had no chest pain or SOB - f/u echo     Carlyle Dolly, M.D.

## 2015-02-10 NOTE — Progress Notes (Signed)
Orthopedic Tech Progress Note Patient Details:  KYLENE WIKSTROM January 13, 1941 [id number]  Patient ID: [name], female   DOB: 1941/01/14, 74 y.o.   MRN: [id number] Placed pt's rle in cpm @0 -60 degrees @ 9:25 am  Hildred Priest 02/10/2015, 9:38 AM

## 2015-02-10 NOTE — Progress Notes (Signed)
Patient was on the CPM machine tonight at bedtime for 2 hours.  Will continue to monitor.  Wilson Singer, RN

## 2015-02-10 NOTE — Progress Notes (Signed)
Orthopedic Tech Progress Note Patient Details:  Jessica Taylor 11/28/1940 [id number] Off cpm at 7:00 pm Patient ID: Jessica Taylor, female   DOB: Jun 28, 1941, 74 y.o.   MRN: [id number]   [name] 02/10/2015, 6:56 PM

## 2015-02-10 NOTE — Progress Notes (Signed)
OT Cancellation Note  Patient Details Name: Jessica Taylor MRN: [id number] DOB: [birth year]   Cancelled Treatment:    Reason Eval/Treat Not Completed: Patient not medically ready.  Patient noted to have upward trending troponin. Also, venous duplex ordered to evaluate for DVT. Will hold until patient medically ready for OT evaluation.   Villa Herb M 02/10/2015, 1:13 PM

## 2015-02-10 NOTE — Progress Notes (Signed)
Orthopedic Tech Progress Note Patient Details:  Jessica Taylor 1941-10-31 [id number] On cpm at 5:00 pm Patient ID: [name], female   DOB: 12-26-1940, 74 y.o.   MRN: [id number]   [name] 02/10/2015, 5:05 PM

## 2015-02-10 NOTE — Progress Notes (Signed)
[facility] PROGRESS NOTE  Jessica Taylor [id number] DOB: 03-07-1941 DOA: 02/08/2015 PCP: [name], MD Interim summary: 74 year old lady with h/o recent right knee replacement admitted for hypoxia of unclear etiology.  Assessment/Plan: Hypoxia : Admitted to telemetry. Unclear etiology, no evidence of pulmonary embolism, edema or pneumonia. There is evidence of subsegmental atelectasis. Incentive spirometry and venous duplex ordered to evaluate for DVT.  Her oxygen requirement has improved from 3 liters to 1 liter.  She is currently not wheezing and does not appear to have copd exacerbation.  Echocardiogram ordered to evaluate for CHF. , though no pulm edema on CT or much pedal edema.  Initial troponin negative, but second troponin is positive., with minimally elevated uptrending troponins.  EKG  Reviewed and does not show any ischemic changes. NSR. Last night she reports some chest heaviness at rest , lasting less than a minute. Cardiology consulted for further recommendations.  She had her last stress in 2012, she has a h/o MI with cardiac cath and stents .     Mild anemia: Normocytic.   Constipation: Stool softeners ordered. No BM YET. Added MOM.    CAD: SOME chest heaviness last night lasted less than a minutes. Cardiology consulted for uptrending troponins, though it is minimal and her EKG does not show any ischemic changes.   Hypothyroidism: tsh within normal limits.  Resume synthroid.      Code Status: full code.  Family Communication: multiple family members at bedside Disposition Plan: pending PT EVAL .    Consultants:  none  Procedures:  none  Antibiotics:  none  HPI/Subjective: Feeling better than yesterday. HER oxygen requirement has improved from 3 liters to 1 liter.   Objective: Filed Vitals:   02/10/15 1303  BP: 134/51  Pulse: 64  Temp: 98.5 F (36.9 C)  Resp: 18    Intake/Output Summary (Last 24 hours) at 02/10/15  1624 Last data filed at 02/10/15 1525  Gross per 24 hour  Intake   1310 ml  Output    700 ml  Net    610 ml   Filed Weights   02/08/15 1619 02/08/15 2332 02/10/15 0553  Weight: 78.019 kg (172 lb) 80.695 kg (177 lb 14.4 oz) 77.6 kg (171 lb 1.2 oz)    Exam:   General:  Alert afebrile on 1 lit  oxygen. Comfortable not in any distress  Cardiovascular: s1s2 RRR  Respiratory: diminished breath sounds at bases, no wheezing or rhonchi  Abdomen: softt non tender non distended bowel sounds heard  Musculoskeletal: no pedal edema. , ace wrap around the right knee.   Data Reviewed: Basic Metabolic Panel:  Recent Labs Lab 02/08/15 1644 02/09/15 0521  NA 139 137  K 4.2 3.7  CL 103 102  CO2 29 29  GLUCOSE 106* 93  BUN 11 10  CREATININE 1.00 0.96  CALCIUM 9.6 9.2   Liver Function Tests:  Recent Labs Lab 02/08/15 1644  AST 15  ALT 11  ALKPHOS 67  BILITOT 0.8  PROT 6.4  ALBUMIN 3.3*   No results for input(s): LIPASE, AMYLASE in the last 168 hours. No results for input(s): AMMONIA in the last 168 hours. CBC:  Recent Labs Lab 02/05/15 0535 02/06/15 0725 02/08/15 1644 02/09/15 0521  WBC 7.3 8.5 7.6 5.3  NEUTROABS  --   --  6.1  --   HGB 12.3 11.1* 12.2 11.1*  HCT 39.5 35.8* 38.2 35.3*  MCV 89.8 90.9 88.8 89.8  PLT 152 130* 193 178  Cardiac Enzymes:  Recent Labs Lab 02/08/15 2337 02/09/15 0521 02/09/15 1215  TROPONINI 0.03 0.04* 0.08*   BNP (last 3 results)  Recent Labs  02/08/15 2337  BNP 67.3    ProBNP (last 3 results) No results for input(s): PROBNP in the last 8760 hours.  CBG:  Recent Labs Lab 02/09/15 0737 02/10/15 0806  GLUCAP 88 119*    No results found for this or any previous visit (from the past 240 hour(s)).   Studies: Dg Chest 2 View  02/08/2015   CLINICAL DATA:  74 year old female status post recent right knee surgery. Increase shortness of breath, fever, weakness. Initial encounter.  EXAM: CHEST  2 VIEW  COMPARISON:   12/28/2014.  FINDINGS: Seated AP and lateral views of the chest. Lower lung volumes. Stable mild cardiomegaly. Other mediastinal contours are within normal limits. Visualized tracheal air column is within normal limits. No pneumothorax or pulmonary edema. There is increased patchy opacity at the left lung base. No definite pleural effusion. No other confluent pulmonary opacity. Calcified atherosclerosis of the aorta. No acute osseous abnormality identified.  IMPRESSION: Lower lung volumes with patchy opacity at the left lung base suspicious for developing pneumonia in this setting. No pleural effusion identified.   Electronically Signed   By: Genevie Ann M.D.   On: 02/08/2015 17:34   Ct Angio Chest Pe W/cm &/or Wo Cm  02/08/2015   CLINICAL DATA:  Knee surgery 02/04/15, has been de-sating since then, denies chest pain. Low oxygen saturation.  EXAM: CT ANGIOGRAPHY CHEST WITH CONTRAST  TECHNIQUE: Multidetector CT imaging of the chest was performed using the standard protocol during bolus administration of intravenous contrast. Multiplanar CT image reconstructions and MIPs were obtained to evaluate the vascular anatomy.  CONTRAST:  114mL OMNIPAQUE IOHEXOL 350 MG/ML SOLN  COMPARISON:  Current chest radiograph.  FINDINGS: There is no evidence of a pulmonary embolus. Heart is top-normal in size. There are dense coronary artery calcifications. The great vessels are normal in caliber. No aortic dissection. No neck base, axillary, mediastinal or hilar masses or pathologically enlarged lymph nodes.  Reticular opacities are noted at the lung bases and in the dependent lower lobes consistent with atelectasis. No lung consolidation to suggest pneumonia. No pulmonary edema. There is no pleural effusion or pneumothorax.  Small sliding hiatal hernia. Limited evaluation of the upper abdomen is otherwise unremarkable.  Bony thorax is demineralized. Mild degenerative changes noted along the thoracic spine. No osteoblastic or osteolytic  lesions.  Review of the MIP images confirms the above findings.  IMPRESSION: 1. No evidence of a pulmonary embolus. 2. Subsegmental atelectasis noted in the dependent lower lobes and at the lung bases. No evidence of pneumonia or pulmonary edema.   Electronically Signed   By: Lajean Manes M.D.   On: 02/08/2015 20:53    Scheduled Meds: . amLODipine  10 mg Oral QAC breakfast  . aspirin EC  325 mg Oral BID PC  . omega-3 acid ethyl esters  1 g Oral Daily   And  . cholecalciferol  1,000 Units Oral Daily  . heparin  5,000 Units Subcutaneous 3 times per day  . levothyroxine  50 mcg Oral QAC breakfast  . metoprolol tartrate  25 mg Oral Daily  . pantoprazole  40 mg Oral Daily  . polyethylene glycol  17 g Oral Daily  . rosuvastatin  20 mg Oral Daily  . senna-docusate  2 tablet Oral BID  . sodium chloride  3 mL Intravenous Q12H   Continuous Infusions:  Principal Problem:   Hypoxia Active Problems:   Mixed hyperlipidemia   Essential hypertension, benign   Primary osteoarthritis of right knee   GERD (gastroesophageal reflux disease)   CAD (coronary artery disease)   Hypothyroidism   Elevated troponin    Time spent: 25 min    [place] [facility] Pager [phone]. If 7PM-7AM, please contact night-coverage at [email or web address], password [facility] 02/10/2015, 4:24 PM  LOS: 2 days

## 2015-02-10 NOTE — Progress Notes (Signed)
PT Cancellation Note  Patient Details Name: Jessica Taylor MRN: [id number] DOB: [birth year]   Cancelled Treatment:    Reason Eval/Treat Not Completed: Medical issues which prohibited therapy Patient noted to have upward trending troponin. Also, venous duplex ordered to evaluate for DVT. Will hold until patient medically ready for comprehensive PT evaluation  Ellouise Newer 02/10/2015, [place], [place]

## 2015-02-11 DIAGNOSIS — R0902 Hypoxemia: Secondary | ICD-10-CM

## 2015-02-11 DIAGNOSIS — R0600 Dyspnea, unspecified: Secondary | ICD-10-CM

## 2015-02-11 LAB — BASIC METABOLIC PANEL
Anion gap: 8 (ref 5–15)
BUN: 15 mg/dL (ref 6–23)
CALCIUM: 9.5 mg/dL (ref 8.4–10.5)
CO2: 29 mmol/L (ref 19–32)
CREATININE: 1.04 mg/dL (ref 0.50–1.10)
Chloride: 106 mmol/L (ref 96–112)
GFR calc Af Amer: 59 mL/min — ABNORMAL LOW (ref >90)
GFR calc non Af Amer: 51 mL/min — ABNORMAL LOW (ref >90)
GLUCOSE: 94 mg/dL (ref 70–99)
Potassium: 3.5 mmol/L (ref 3.5–5.1)
Sodium: 143 mmol/L (ref 135–145)

## 2015-02-11 LAB — HEMOGLOBIN A1C
Hgb A1c MFr Bld: 5.9 % — ABNORMAL HIGH (ref 4.8–5.6)
Mean Plasma Glucose: 123 mg/dL

## 2015-02-11 LAB — CBC
HCT: 37.7 % (ref 36.0–46.0)
Hemoglobin: 11.9 g/dL — ABNORMAL LOW (ref 12.0–15.0)
MCH: 28.6 pg (ref 26.0–34.0)
MCHC: 31.6 g/dL (ref 30.0–36.0)
MCV: 90.6 fL (ref 78.0–100.0)
PLATELETS: 212 10*3/uL (ref 150–400)
RBC: 4.16 MIL/uL (ref 3.87–5.11)
RDW: 14.7 % (ref 11.5–15.5)
WBC: 5.5 10*3/uL (ref 4.0–10.5)

## 2015-02-11 LAB — GLUCOSE, CAPILLARY: GLUCOSE-CAPILLARY: 110 mg/dL — AB (ref 70–99)

## 2015-02-11 MED ORDER — MAGNESIUM HYDROXIDE 400 MG/5ML PO SUSP
30.0000 mL | Freq: Every day | ORAL | Status: DC
  Filled 2015-02-11: qty 30

## 2015-02-11 MED ORDER — REGADENOSON 0.4 MG/5ML IV SOLN
0.4000 mg | Freq: Once | INTRAVENOUS | Status: AC
  Administered 2015-02-12: 0.4 mg via INTRAVENOUS
  Filled 2015-02-11: qty 5

## 2015-02-11 NOTE — Progress Notes (Signed)
[facility] PROGRESS NOTE  Jessica Taylor [id number] DOB: March 17, 1941 DOA: 02/08/2015 PCP: [name], MD Interim summary: 74 year old lady with h/o recent right knee replacement admitted for hypoxia of unclear etiology. She required 4 liters [place] oxygen on admission, now she is off oxygen.  Assessment/Plan: Hypoxia : Admitted to telemetry. Unclear etiology, no evidence of pulmonary embolism, edema or pneumonia. There is evidence of subsegmental atelectasis. Incentive spirometry and venous duplex ordered to evaluate for DVT.  Her oxygen requirement has improved from 3 liters to 1 liter.  She is currently not wheezing and does not appear to have copd exacerbation.  Echocardiogram ordered to evaluate for CHF. , though no pulm edema on CT or much pedal edema.  Initial troponin negative, but second troponin is positive., with minimally elevated uptrending troponins.  EKG  Reviewed and does not show any ischemic changes. NSR. Last night she reports some chest heaviness at rest , lasting less than a minute. Cardiology consulted for further recommendations.  She had her last stress in 2012, she has a h/o MI with cardiac cath and stents .  Echocardiogram showed LVef OF 65% and grade 1 diastolic dysfunction.     Mild anemia: Normocytic.   Constipation: I BM yet.    CAD: SOME chest heaviness last night lasted less than a minutes. Cardiology consulted for uptrending troponins, though it is minimal and her EKG does not show any ischemic changes. Echocardiogram ordered and does not appear to have any regional wall motion abnormalities.  LVEF is 60 %There is grade 1 diastolic dysfunction.   Hypothyroidism: tsh within normal limits.  Resume synthroid.      Code Status: full code.  Family Communication: multiple family members at bedside Disposition Plan: pending PT EVAL .    Consultants:  none  Procedures:  none  Antibiotics:  none  HPI/Subjective: Feeling better than  yesterday. HER oxygen requirement has improved from 3 liters to 1 liter.   Objective: Filed Vitals:   02/11/15 1421  BP: 126/56  Pulse:   Temp: 98.4 F (36.9 C)  Resp: 16    Intake/Output Summary (Last 24 hours) at 02/11/15 1901 Last data filed at 02/11/15 [id number]  Gross per 24 hour  Intake    123 ml  Output    525 ml  Net   -402 ml   Filed Weights   02/08/15 2332 02/10/15 0553 02/11/15 0500  Weight: 80.695 kg (177 lb 14.4 oz) 77.6 kg (171 lb 1.2 oz) 78 kg (171 lb 15.3 oz)    Exam:   General:  Alert afebrile off oxygen.  Comfortable not in any distress  Cardiovascular: s1s2 RRR  Respiratory: good air entry bilateral, no wheezing or rhonchi  Abdomen: softt non tender non distended bowel sounds heard  Musculoskeletal: no pedal edema. , ace wrap around the right knee.   Data Reviewed: Basic Metabolic Panel:  Recent Labs Lab 02/08/15 1644 02/09/15 0521 02/11/15 0557  NA 139 137 143  K 4.2 3.7 3.5  CL 103 102 106  CO2 29 29 29   GLUCOSE 106* 93 94  BUN 11 10 15   CREATININE 1.00 0.96 1.04  CALCIUM 9.6 9.2 9.5   Liver Function Tests:  Recent Labs Lab 02/08/15 1644  AST 15  ALT 11  ALKPHOS 67  BILITOT 0.8  PROT 6.4  ALBUMIN 3.3*   No results for input(s): LIPASE, AMYLASE in the last 168 hours. No results for input(s): AMMONIA in the last 168 hours. CBC:  Recent Labs Lab  02/05/15 0535 02/06/15 0725 02/08/15 1644 02/09/15 0521 02/11/15 0557  WBC 7.3 8.5 7.6 5.3 5.5  NEUTROABS  --   --  6.1  --   --   HGB 12.3 11.1* 12.2 11.1* 11.9*  HCT 39.5 35.8* 38.2 35.3* 37.7  MCV 89.8 90.9 88.8 89.8 90.6  PLT 152 130* 193 178 212   Cardiac Enzymes:  Recent Labs Lab 02/08/15 2337 02/09/15 0521 02/09/15 1215  TROPONINI 0.03 0.04* 0.08*   BNP (last 3 results)  Recent Labs  02/08/15 2337  BNP 67.3    ProBNP (last 3 results) No results for input(s): PROBNP in the last 8760 hours.  CBG:  Recent Labs Lab 02/09/15 0737 02/10/15 0806  02/11/15 0759  GLUCAP 88 119* 110*    No results found for this or any previous visit (from the past 240 hour(s)).   Studies: No results found.  Scheduled Meds: . amLODipine  10 mg Oral QAC breakfast  . aspirin EC  325 mg Oral BID PC  . omega-3 acid ethyl esters  1 g Oral Daily   And  . cholecalciferol  1,000 Units Oral Daily  . heparin  5,000 Units Subcutaneous 3 times per day  . levothyroxine  50 mcg Oral QAC breakfast  . magnesium hydroxide  30 mL Oral Daily  . metoprolol tartrate  25 mg Oral Daily  . pantoprazole  40 mg Oral Daily  . polyethylene glycol  17 g Oral Daily  . regadenoson  0.4 mg Intravenous Once  . rosuvastatin  20 mg Oral Daily  . senna-docusate  2 tablet Oral BID  . sodium chloride  3 mL Intravenous Q12H   Continuous Infusions:   Principal Problem:   Hypoxia Active Problems:   Mixed hyperlipidemia   Essential hypertension, benign   Primary osteoarthritis of right knee   GERD (gastroesophageal reflux disease)   CAD (coronary artery disease)   Hypothyroidism   Elevated troponin    Time spent: 20 min    [facility] Pager [phone]. If 7PM-7AM, please contact night-coverage at [email or web address], password [facility] 02/11/2015, 7:01 PM  LOS: 3 days

## 2015-02-11 NOTE — Progress Notes (Signed)
    Subjective:  Feels well. No CP or dyspnea.   Objective:  Vital Signs in the last 24 hours: Temp:  [98.3 F (36.8 C)-98.4 F (36.9 C)] 98.3 F (36.8 C) (03/21 0639) Pulse Rate:  [69-71] 69 (03/21 0639) Resp:  [16] 16 (03/21 0639) BP: (139-152)/(54-64) 139/54 mmHg (03/21 0639) SpO2:  [90 %-96 %] 90 % (03/21 0639) Weight:  [171 lb 15.3 oz (78 kg)] 171 lb 15.3 oz (78 kg) (03/21 0500)  Intake/Output from previous day: 03/20 0701 - 03/21 0700 In: 1463 [P.O.:1460; I.V.:3] Out: 750 [Urine:750]  Physical Exam: Pt is alert and oriented, NAD HEENT: normal Neck: JVP - normal, carotids 2+= without bruits Lungs: CTA bilaterally CV: RRR without murmur or gallop Abd: soft, NT, Positive BS, no hepatomegaly Ext: no C/C/E, distal pulses intact and equal Skin: warm/dry no rash   Lab Results:  Recent Labs  02/09/15 0521 02/11/15 0557  WBC 5.3 5.5  HGB 11.1* 11.9*  PLT 178 212    Recent Labs  02/09/15 0521 02/11/15 0557  NA 137 143  K 3.7 3.5  CL 102 106  CO2 29 29  GLUCOSE 93 94  BUN 10 15  CREATININE 0.96 1.04    Recent Labs  02/09/15 0521 02/09/15 1215  TROPONINI 0.04* 0.08*    Cardiac Studies: 2 D echo pending  Assessment/Plan:  1. Arterial hypoxemia 2. CAD s/p PCI without evidence of angina/ischemic symptoms  Await 2D echo. She has had no chest pain or pressure by history. Has had no sx's like those when she's had stenting in the past. Reviewed imaging studies. As long as echo is within expected limits, would not anticipate further inpatient cardiac evaluation.   Sherren Mocha, M.D. 02/11/2015, 1:21 PM

## 2015-02-11 NOTE — Evaluation (Signed)
Physical Therapy Evaluation Patient Details Name: PAM FINNIGAN MRN: [id number] DOB: 05-18-1941 Today's Date: 02/11/2015   History of Present Illness    74 year old lady with h/o recent right knee replacement admitted for hypoxia of unclear etiology  Clinical Impression  Pt admitted with above diagnosis. Pt currently with functional limitations due to the deficits listed below (see PT Problem List). Pt mobilizing well.  Desat with activity on RA.  May need O2 at home.  Encouraged incentive spirometer.  Willfollow acutely.   Pt will benefit from skilled PT to increase their independence and safety with mobility to allow discharge to the venue listed below.     Follow Up Recommendations Home health PT;Supervision/Assistance - 24 hour    Equipment Recommendations  None recommended by PT    Recommendations for Other Services       Precautions / Restrictions Precautions Precautions: Knee Precaution Comments: recent knee replacement Restrictions Weight Bearing Restrictions: Yes RLE Weight Bearing: Weight bearing as tolerated      Mobility  Bed Mobility Overal bed mobility: Independent Bed Mobility: Supine to Sit     Supine to sit: Independent     General bed mobility comments: used left LE to move right LE to EOB effectively.   Transfers Overall transfer level: Needs assistance Equipment used: Rolling walker (2 wheeled) Transfers: Sit to/from Stand Sit to Stand: Modified independent (Device/Increase time)         General transfer comment: safe technique  Ambulation/Gait Ambulation/Gait assistance: Modified independent (Device/Increase time) Ambulation Distance (Feet): 250 Feet Assistive device: Rolling walker (2 wheeled) Gait Pattern/deviations: Step-to pattern;Decreased stride length   Gait velocity interpretation: <1.8 ft/sec, indicative of risk for recurrent falls General Gait Details: Pt with good cadence overall.  Good safety with RW.  Desats with activity.   See below.    Stairs            Wheelchair Mobility    Modified Rankin (Stroke Patients Only)       Balance Overall balance assessment: Needs assistance Sitting-balance support: No upper extremity supported;Feet supported Sitting balance-Leahy Scale: Good     Standing balance support: Bilateral upper extremity supported;During functional activity Standing balance-Leahy Scale: Fair Standing balance comment: can stand statically without use of UE support.                               Pertinent Vitals/Pain Pain Assessment: No/denies pain    SATURATION QUALIFICATIONS: (This note is used to comply with regulatory documentation for home oxygen)  Patient Saturations on Room Air at Rest = 92%  Patient Saturations on Room Air while Ambulating = 86-88%  Patient Saturations on 2 Liters of oxygen while Ambulating = 90-92%  Please briefly explain why patient needs home oxygen:Pt desat below 90% on RA with ambulation.  Sats >90% with O2 at 2L.  Encouraged pursed lip breathing and incentive spirometer.  Home Living Family/patient expects to be discharged to:: Private residence Living Arrangements: Spouse/significant other Available Help at Discharge: Family Type of Home: House Home Access: Stairs to enter   Technical brewer of Steps: [place]: One [place]: Environmental consultant - 2 wheels;Bedside commode      Prior Function Level of Independence: Independent with assistive device(s)               Hand Dominance        Extremity/Trunk Assessment   Upper Extremity Assessment: Defer to OT evaluation  Lower Extremity Assessment: RLE deficits/detail RLE Deficits / Details: Can SLR 10 x; knee flexion to 85 degrees, knee extension close to neutral.  Strength at hip 3/5, knee 3-/5, ankle 3/5.      Cervical / Trunk Assessment: Normal  Communication   Communication: No difficulties  Cognition Arousal/Alertness:  Awake/alert Behavior During Therapy: WFL for tasks assessed/performed Overall Cognitive Status: Within Functional Limits for tasks assessed                      General Comments      Exercises Total Joint Exercises Ankle Circles/Pumps: AROM;10 reps;Right;Left;Supine Quad Sets: AROM;5 reps;Supine;Right Heel Slides: 5 reps;Right;AROM Straight Leg Raises: AROM;10 reps;Supine;Right Goniometric ROM: 3-85 degrees      Assessment/Plan    PT Assessment Patient needs continued PT services  PT Diagnosis Generalized weakness   PT Problem List Decreased activity tolerance;Cardiopulmonary status limiting activity  PT Treatment Interventions Gait training;Stair training;Functional mobility training;Therapeutic activities;Balance training;Therapeutic exercise;DME instruction   PT Goals (Current goals can be found in the Care Plan section) Acute Rehab PT Goals Patient Stated Goal: return home PT Goal Formulation: With patient Time For Goal Achievement: 02/18/15 Potential to Achieve Goals: Good    Frequency Min 3X/week   Barriers to discharge Other (comment) (O2 sats)      Co-evaluation               End of Session Equipment Utilized During Treatment: Gait belt;Oxygen Activity Tolerance: Patient limited by fatigue Patient left: in bed;with call bell/phone within reach;with family/visitor present Nurse Communication: Mobility status (O2 sats)         Time: [id number] PT Time Calculation (min) (ACUTE ONLY): 23 min   Charges:   PT Evaluation $Initial PT Evaluation Tier I: 1 Procedure PT Treatments $Gait Training: 8-22 mins   PT G CodesDenice Paradise 03-02-15, 12:14 PM Aneesh Faller,PT Acute Rehabilitation [phone] (pager)

## 2015-02-11 NOTE — Progress Notes (Signed)
Ortho pt to set up CRM.  TID.  This morning from 1130-1330  & 1730 to 1900.  Ortho tech has not been charting this

## 2015-02-11 NOTE — Progress Notes (Signed)
Medicare Important Message given?  YES (If response is "NO", the following Medicare IM given date fields will be blank) Date Medicare IM given:  02/11/15 Medicare IM given by:  Laylani Pudwill 

## 2015-02-11 NOTE — Evaluation (Signed)
Occupational Therapy Evaluation and Discharge Patient Details Name: Jessica Taylor MRN: [id number] DOB: 1941-10-18 Today's Date: 02/11/2015    History of Present Illness Pt is a 74 y.o. Female admitted  for hypoxia. Pt with recent R TKA.   Clinical Impression   PTA pt lived at home and mod I with use of RW with min A for LB ADLs from husband since knee surgery. When OT arrived, pt was not wearing [place] and after mobility sats remained at 95-98%. Pt at Mod I level for functional mobility. No further acute OT needs.     Follow Up Recommendations  No OT follow up;Supervision - Intermittent    Equipment Recommendations  None recommended by OT    Recommendations for Other Services       Precautions / Restrictions Precautions Precautions: Knee Precaution Comments: recent knee replacement Restrictions Weight Bearing Restrictions: Yes RLE Weight Bearing: Weight bearing as tolerated      Mobility Bed Mobility Overal bed mobility: Independent                Transfers Overall transfer level: Modified independent Equipment used: Rolling walker (2 wheeled)                       ADL Overall ADL's : Needs assistance/impaired                                       General ADL Comments: Pt overall Mod I with functional mobility. Pt will have husband to assist with LB ADLs as needed, but has been wearing slip on shoes and is working on donning pants independently.      Vision Vision Assessment?: No apparent visual deficits          Pertinent Vitals/Pain Pain Assessment: No/denies pain        Extremity/Trunk Assessment Upper Extremity Assessment Upper Extremity Assessment: Overall WFL for tasks assessed   Lower Extremity Assessment Lower Extremity Assessment: Defer to PT evaluation   Cervical / Trunk Assessment Cervical / Trunk Assessment: Normal   Communication Communication Communication: No difficulties   Cognition Arousal/Alertness:  Awake/alert Behavior During Therapy: WFL for tasks assessed/performed Overall Cognitive Status: Within Functional Limits for tasks assessed                                Home Living Family/patient expects to be discharged to:: Private residence Living Arrangements: Spouse/significant other Available Help at Discharge: Family Type of Home: House Home Access: Stairs to enter Technical brewer of Steps: 3   Home Layout: One level               Home Equipment: Environmental consultant - 2 wheels;Bedside commode          Prior Functioning/Environment Level of Independence: Independent with assistive device(s)        Comments: husband has been assisting with LB ADLs since knee surgery. Pt has been wearing slip on shoes.     OT Diagnosis: Generalized weakness;Acute pain;Other (comment) (decreased cardiopulmonary status)    End of Session Equipment Utilized During Treatment: Rolling walker CPM Right Knee CPM Right Knee: Off Right Knee Flexion (Degrees): 75 Right Knee Extension (Degrees): 0  Activity Tolerance: Patient tolerated treatment well Patient left: in bed;with call bell/phone within reach;with family/visitor present   Time: [id number] OT Time Calculation (min): 10 min Charges:  OT General Charges $OT Visit: 1 Procedure OT Evaluation $Initial OT Evaluation Tier I: 1 Procedure G-Codes:    Juluis Rainier 03/07/15, 5:22 PM  Cyndie Chime, OTR/L Occupational Therapist [phone] (pager)

## 2015-02-11 NOTE — Progress Notes (Signed)
  Echocardiogram 2D Echocardiogram has been performed.  Diamond Nickel 02/11/2015, 10:32 AM

## 2015-02-11 NOTE — Progress Notes (Signed)
Orthopedic Tech Progress Note Patient Details:  Jessica Taylor 14-Nov-1941 [id number] Patient placed in CPM CPM Right Knee CPM Right Knee: On Right Knee Flexion (Degrees): 75 Right Knee Extension (Degrees): 0 Additional Comments: trapeze bar patient helper   [place] R Thompson 02/11/2015, 5:07 PM

## 2015-02-11 NOTE — Progress Notes (Signed)
VASCULAR LAB PRELIMINARY  PRELIMINARY  PRELIMINARY  PRELIMINARY  Bilateral lower extremity venous duplex  completed.    Preliminary report:  Bilateral:  No evidence of DVT, superficial thrombosis, or Baker's Cyst.    Messiyah Waterson, RVT 02/11/2015, 3:06 PM

## 2015-02-11 NOTE — Progress Notes (Signed)
SATURATION QUALIFICATIONS: (This note is used to comply with regulatory documentation for home oxygen)  Patient Saturations on Room Air at Rest = 92%  Patient Saturations on Room Air while Ambulating = 86-88%  Patient Saturations on 2 Liters of oxygen while Ambulating = 90-92%  Please briefly explain why patient needs home oxygen:Pt desat below 90% on RA with ambulation.  Sats >90% with O2 at 2L.  Encouraged pursed lip breathing and incentive spirometer.  Thanks.   [place] [phone] (pager)

## 2015-02-12 ENCOUNTER — Inpatient Hospital Stay (HOSPITAL_COMMUNITY): Payer: Medicare Other

## 2015-02-12 ENCOUNTER — Other Ambulatory Visit: Payer: Self-pay

## 2015-02-12 DIAGNOSIS — R079 Chest pain, unspecified: Secondary | ICD-10-CM

## 2015-02-12 LAB — GLUCOSE, CAPILLARY: Glucose-Capillary: 117 mg/dL — ABNORMAL HIGH (ref 70–99)

## 2015-02-12 MED ORDER — REGADENOSON 0.4 MG/5ML IV SOLN
INTRAVENOUS | Status: AC
  Administered 2015-02-12: 0.4 mg via INTRAVENOUS
  Filled 2015-02-12: qty 5

## 2015-02-12 MED ORDER — TECHNETIUM TC 99M SESTAMIBI GENERIC - CARDIOLITE
10.0000 | Freq: Once | INTRAVENOUS | Status: AC | PRN
  Administered 2015-02-12: 10 via INTRAVENOUS

## 2015-02-12 MED ORDER — TECHNETIUM TC 99M SESTAMIBI GENERIC - CARDIOLITE
30.0000 | Freq: Once | INTRAVENOUS | Status: AC | PRN
  Administered 2015-02-12: 30 via INTRAVENOUS

## 2015-02-12 NOTE — Discharge Summary (Signed)
Patient ID: Jessica Taylor MRN: [id number] DOB/AGE: Aug 10, 1941 74 y.o.  Admit date: 02/04/2015 Discharge date: 02/06/2015 Admission Diagnoses:  Principal Problem:   Primary osteoarthritis of right knee   Discharge Diagnoses:  Same  Past Medical History  Diagnosis Date  . Acid reflux   . Thyroid disease   . 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 Hypercholesteremia   . Hypothyroidism   . Vertigo     when lays on left side.  . Cancer     basal cell on nose  . Anemia   . Coronary artery disease   . Hypertension     Dr. Marlou Porch oversees her cardiac care.   . Arthritis     knees , R shoulder - tx /w injection - 11/2014    Surgeries: Procedure(s): Right UNICOMPARTMENTAL KNEE on 02/04/2015   Discharged Condition: Improved  Hospital Course: Jessica Taylor is an 74 y.o. female who was admitted 02/04/2015 for operative treatment ofPrimary osteoarthritis of right knee. Patient has severe unremitting pain that affects sleep, daily activities, and work/hobbies. After pre-op clearance the patient was taken to the operating room on 02/04/2015 and underwent  Procedure(s): Right UNICOMPARTMENTAL KNEE.    Patient was given perioperative antibiotics: Anti-infectives    Start     Dose/Rate Route Frequency Ordered Stop   02/04/15 2200  ceFAZolin (ANCEF) IVPB 2 g/50 mL premix     2 g 100 mL/hr over 30 Minutes Intravenous Every 6 hours 02/04/15 2126 02/05/15 0254       Patient was given sequential compression devices, early ambulation, and chemoprophylaxis to prevent DVT. The patient has significant pain on postoperative day #1. She made slow progress with physical therapy. On the date of discharge she had somewhat low O2 saturations. She had no chest pain or shortness of breath. This was felt to be a chronic and stable finding for her. She overall felt well and felt that she was ready for discharge home. She was afebrile. Her right knee wound was benign and her calf was soft and nontender. She did have a mild right knee  effusion which would be expected.  Patient benefited maximally from hospital stay and there were no complications.    Recent vital signs: See hospital chart for details   Recent laboratory studies: Results for Jessica, Taylor (MRN [id number]) as of 02/12/2015 08:25  Ref. Range 12/28/2014 15:35 01/25/2015 13:46 01/25/2015 13:50 02/05/2015 05:35 02/06/2015 07:25  WBC Latest Range: 4.0-10.5 K/uL  5.9  7.3 8.5  RBC Latest Range: 3.87-5.11 MIL/uL  4.89  4.40 3.94  Hemoglobin Latest Range: 12.0-15.0 g/dL  14.3  12.3 11.1 (L)  HCT Latest Range: 36.0-46.0 %  44.0  39.5 35.8 (L)  MCV Latest Range: 78.0-100.0 fL  90.0  89.8 90.9  MCH Latest Range: 26.0-34.0 pg  29.2  28.0 28.2  MCHC Latest Range: 30.0-36.0 g/dL  32.5  31.1 31.0  RDW Latest Range: 11.5-15.5 %  14.5  14.4 14.8  Platelets Latest Range: 150-400 K/uL  180  152 130 (L)   Results for Jessica, Taylor (MRN [id number]) as of 02/12/2015 08:25  Ref. Range 01/25/2015 13:46  Sodium Latest Range: 135-145 mmol/L 142  Potassium Latest Range: 3.5-5.1 mmol/L 4.1  Chloride Latest Range: 96-112 mmol/L 109  CO2 Latest Range: 19-32 mmol/L 25  BUN Latest Range: 6-23 mg/dL 23  Creatinine Latest Range: 0.50-1.10 mg/dL 1.12 (H)  Calcium Latest Range: 8.4-10.5 mg/dL 9.6  GFR calc non Af Amer Latest Range: >90 mL/min 47 (L)  GFR calc Af  Amer Latest Range: >90 mL/min 54 (L)  Glucose Latest Range: 70-99 mg/dL 112 (H)  Anion gap Latest Range: 5-15  8    Discharge Medications:     Medication List    TAKE these medications        amLODipine 10 MG tablet  Commonly known as:  NORVASC  Take 10 mg by mouth daily before breakfast.     aspirin EC 325 MG tablet  Take 1 tablet (325 mg total) by mouth 2 (two) times daily after a meal. Take x 1 month post op to decrease risk of blood clots.     FISH OIL + D3 PO  Take 1 capsule by mouth daily.     HYDROcodone-acetaminophen 5-325 MG per tablet  Commonly known as:  NORCO/VICODIN  Take 1-2 tablets by mouth every 4  (four) hours as needed for severe pain.     levothyroxine 50 MCG tablet  Commonly known as:  SYNTHROID, LEVOTHROID  Take 50 mcg by mouth daily before breakfast.     methocarbamol 500 MG tablet  Commonly known as:  ROBAXIN  Take 1 tablet (500 mg total) by mouth every 8 (eight) hours as needed for muscle spasms.     metoprolol tartrate 25 MG tablet  Commonly known as:  LOPRESSOR  Take 25 mg by mouth daily.     NEXIUM 40 MG capsule  Generic drug:  esomeprazole  TAKE ONE CAPSULE BY MOUTH ONCE DAILY AT NOON     oxyCODONE-acetaminophen 5-325 MG per tablet  Commonly known as:  PERCOCET/ROXICET  Take 1-2 tablets by mouth every 6 (six) hours as needed for severe pain.     rosuvastatin 20 MG tablet  Commonly known as:  CRESTOR  Take 1 tablet (20 mg total) by mouth daily.     THERA TEARS ALLERGY OP  Apply 1 drop to eye 2 (two) times daily as needed (dry eyes).        Diagnostic Studies: none Disposition: 01-Home or Self Care      Discharge Instructions    CPM    Complete by:  As directed   Continuous passive motion machine (CPM):      Use the CPM from 0 to 60 for 8 hours per day.      You may increase by 5-10 per day.  You may break it up into 2 or 3 sessions per day.      Use CPM for 1-2 weeks or until you are told to stop.     CPM    Complete by:  As directed   Continuous passive motion machine (CPM):      Use the CPM from 0 to 60 for 8 hours per day.      You may increase by 5-10 per day.  You may break it up into 2 or 3 sessions per day.      Use CPM for 1-2 weeks or until you are told to stop.     Call MD / Call 911    Complete by:  As directed   If you experience chest pain or shortness of breath, CALL 911 and be transported to the hospital emergency room.  If you develope a fever above 101 F, pus (white drainage) or increased drainage or redness at the wound, or calf pain, call your surgeon's office.     Constipation Prevention    Complete by:  As directed    Drink plenty of fluids.  Prune juice may be helpful.  You  may use a stool softener, such as Colace (over the counter) 100 mg twice a day.  Use MiraLax (over the counter) for constipation as needed.     Diet general    Complete by:  As directed      Do not put a pillow under the knee. Place it under the heel.    Complete by:  As directed      Do not put a pillow under the knee. Place it under the heel.    Complete by:  As directed      Increase activity slowly as tolerated    Complete by:  As directed      Weight bearing as tolerated    Complete by:  As directed   Laterality:  right  Extremity:  Lower     Weight bearing as tolerated    Complete by:  As directed   Laterality:  right  Extremity:  Lower           Follow-up Information    Follow up with GRAVES,JOHN L, MD. Schedule an appointment as soon as possible for a visit in 2 weeks.   Specialty:  Orthopedic Surgery   Contact information:   [place] [address] [phone]       Follow up with [facility].   Why:  They will contact you to schedule home nurse and therapy visits.   Contact information:   [address] [place] [address] [phone]        Signed: Erlene Senters 02/12/2015, 8:23 AM

## 2015-02-12 NOTE — Progress Notes (Signed)
[name] to be D/C'd Home per MD order.  Discussed with the patient and all questions fully answered.  VSS, Skin clean, dry and intact without evidence of skin break down, no evidence of skin tears noted. IV catheter discontinued intact. Site without signs and symptoms of complications. Dressing and pressure applied.  An After Visit Summary was printed and given to the patient. Patient received prescription.  D/c education completed with patient/family including follow up instructions, medication list, d/c activities limitations if indicated, with other d/c instructions as indicated by MD - patient able to verbalize understanding, all questions fully answered.   Patient instructed to return to ED, call 911, or call MD for any changes in condition.   Patient escorted via [place], and D/C home via private auto.  Audria Nine F 02/12/2015 4:21 PM

## 2015-02-12 NOTE — Progress Notes (Signed)
PT Cancellation Note  Patient Details Name: [name] MRN: [id number] DOB: 1941-03-27   Cancelled Treatment:    Reason Eval/Treat Not Completed: Patient declined, no reason specified.  "going home" 02/12/2015  Donnella Sham, [place] [phone]  (pager)   Donnarae Rae, Tessie Fass 02/12/2015, 3:54 PM

## 2015-02-12 NOTE — Discharge Summary (Signed)
Physician Discharge Summary  Jessica Taylor [id number] DOB: 1941-07-26 DOA: 02/08/2015  PCP: [name], MD  Admit date: 02/08/2015 Discharge date: 02/12/2015  Time spent: 30 minutes  Recommendations for Outpatient Follow-up:  1. Follow up with PCP in one week 2. Follow up with cardiology and orthopedics as recommended  Discharge Diagnoses:  Principal Problem:   Hypoxia Active Problems:   Mixed hyperlipidemia   Essential hypertension, benign   Primary osteoarthritis of right knee   GERD (gastroesophageal reflux disease)   CAD (coronary artery disease)   Hypothyroidism   Elevated troponin   Discharge Condition:  improved  Diet recommendation: low sodium diet  Filed Weights   02/10/15 0553 02/11/15 0500 02/12/15 0606  Weight: 77.6 kg (171 lb 1.2 oz) 78 kg (171 lb 15.3 oz) 78.9 kg (173 lb 15.1 oz)    History of present illness:  74 year old lady with h/o recent right knee replacement admitted for hypoxia of unclear etiology. She required 4 liters [place] oxygen on admission, now she is off oxygen.   Hospital Course:  Hypoxia : Admitted to telemetry. Unclear etiology, no evidence of pulmonary embolism, edema or pneumonia. There is evidence of subsegmental atelectasis. Incentive spirometry ordered. She is currently not wheezing and does not appear to have copd exacerbation.  Echocardiogram ordered to evaluate for CHF. , though no pulm edema on CT or much pedal edema.  Initial troponin negative, but second troponin is positive., with minimally elevated uptrending troponins. EKG Reviewed and does not show any ischemic changes. NSR. Last night she reports some chest heaviness at rest , lasting less than a minute. Cardiology consulted for further recommendations. She underwent stress test in am and was found to be low risk.  She had her last stress in 2012, she has a h/o MI with cardiac cath and stents .  Echocardiogram showed LVef OF 65% and grade 1 diastolic dysfunction.   On discharge she did not require any oxygen and was discharged home to follow up with Dr Marlou Porch.    Mild anemia: Normocytic.   Constipation: I BM yet.    CAD: SOME chest heaviness last night lasted less than a minutes. Cardiology consulted for uptrending troponins, though it is minimal and her EKG does not show any ischemic changes. Echocardiogram ordered and does not appear to have any regional wall motion abnormalities. Stress test this admission is negative.  LVEF is 60 %There is grade 1 diastolic dysfunction.   Hypothyroidism: tsh within normal limits.  Resume synthroid.   Procedures:  none  Consultations:  Cardiology  Orthopedics.   Discharge Exam: Filed Vitals:   02/12/15 1325  BP: 135/77  Pulse: 68  Temp: 97.5 F (36.4 C)  Resp: 18    General: alert afebrile comfortable Cardiovascular: s1s2 Respiratory: ctab  Discharge Instructions   Discharge Instructions    Diet - low sodium heart healthy    Complete by:  As directed      Discharge instructions    Complete by:  As directed   Follow up with Dr Marlou Porch in 4 to 6 weeks as needed.  Follow up with orthopedics as recommended.          Current Discharge Medication List    CONTINUE these medications which have NOT CHANGED   Details  amLODipine (NORVASC) 10 MG tablet Take 10 mg by mouth daily before breakfast.     aspirin EC 325 MG tablet Take 1 tablet (325 mg total) by mouth 2 (two) times daily after a meal. Take  x 1 month post op to decrease risk of blood clots. Qty: 60 tablet, Refills: 0    Fish Oil-Cholecalciferol (FISH OIL + D3 PO) Take 1 capsule by mouth daily.    HYDROcodone-acetaminophen (NORCO/VICODIN) 5-325 MG per tablet Take 1-2 tablets by mouth every 4 (four) hours as needed for severe pain. Qty: 60 tablet, Refills: 0    Ketotifen Fumarate (THERA TEARS ALLERGY OP) Apply 1 drop to eye 2 (two) times daily as needed (dry eyes).    levothyroxine (SYNTHROID, LEVOTHROID) 50 MCG tablet  Take 50 mcg by mouth daily before breakfast.    methocarbamol (ROBAXIN) 500 MG tablet Take 1 tablet (500 mg total) by mouth every 8 (eight) hours as needed for muscle spasms. Qty: 50 tablet, Refills: 0    metoprolol tartrate (LOPRESSOR) 25 MG tablet Take 25 mg by mouth daily.    NEXIUM 40 MG capsule TAKE ONE CAPSULE BY MOUTH ONCE DAILY AT NOON Qty: 30 capsule, Refills: 0    rosuvastatin (CRESTOR) 20 MG tablet Take 1 tablet (20 mg total) by mouth daily. Qty: 30 tablet, Refills: 5      STOP taking these medications     oxyCODONE-acetaminophen (PERCOCET/ROXICET) 5-325 MG per tablet        Allergies  Allergen Reactions  . Shrimp [Shellfish Allergy]     Throws up violently   Follow-up Information    Follow up with [facility].   Why:  hhpt   Contact information:   [address] [place] [address] [phone]        The results of significant diagnostics from this hospitalization (including imaging, microbiology, ancillary and laboratory) are listed below for reference.    Significant Diagnostic Studies: Dg Chest 2 View  02/08/2015   CLINICAL DATA:  74 year old female status post recent right knee surgery. Increase shortness of breath, fever, weakness. Initial encounter.  EXAM: CHEST  2 VIEW  COMPARISON:  12/28/2014.  FINDINGS: Seated AP and lateral views of the chest. Lower lung volumes. Stable mild cardiomegaly. Other mediastinal contours are within normal limits. Visualized tracheal air column is within normal limits. No pneumothorax or pulmonary edema. There is increased patchy opacity at the left lung base. No definite pleural effusion. No other confluent pulmonary opacity. Calcified atherosclerosis of the aorta. No acute osseous abnormality identified.  IMPRESSION: Lower lung volumes with patchy opacity at the left lung base suspicious for developing pneumonia in this setting. No pleural effusion identified.   Electronically Signed   By: Genevie Ann  M.D.   On: 02/08/2015 17:34   Ct Angio Chest Pe W/cm &/or Wo Cm  02/08/2015   CLINICAL DATA:  Knee surgery 02/04/15, has been de-sating since then, denies chest pain. Low oxygen saturation.  EXAM: CT ANGIOGRAPHY CHEST WITH CONTRAST  TECHNIQUE: Multidetector CT imaging of the chest was performed using the standard protocol during bolus administration of intravenous contrast. Multiplanar CT image reconstructions and MIPs were obtained to evaluate the vascular anatomy.  CONTRAST:  169mL OMNIPAQUE IOHEXOL 350 MG/ML SOLN  COMPARISON:  Current chest radiograph.  FINDINGS: There is no evidence of a pulmonary embolus. Heart is top-normal in size. There are dense coronary artery calcifications. The great vessels are normal in caliber. No aortic dissection. No neck base, axillary, mediastinal or hilar masses or pathologically enlarged lymph nodes.  Reticular opacities are noted at the lung bases and in the dependent lower lobes consistent with atelectasis. No lung consolidation to suggest pneumonia. No pulmonary edema. There is no pleural effusion  or pneumothorax.  Small sliding hiatal hernia. Limited evaluation of the upper abdomen is otherwise unremarkable.  Bony thorax is demineralized. Mild degenerative changes noted along the thoracic spine. No osteoblastic or osteolytic lesions.  Review of the MIP images confirms the above findings.  IMPRESSION: 1. No evidence of a pulmonary embolus. 2. Subsegmental atelectasis noted in the dependent lower lobes and at the lung bases. No evidence of pneumonia or pulmonary edema.   Electronically Signed   By: Lajean Manes M.D.   On: 02/08/2015 20:53   Nm Myocar Multi W/spect W/wall Motion / Ef  02/12/2015   CLINICAL DATA:  74 year old female with a history of chest pain.  EXAM: MYOCARDIAL IMAGING WITH SPECT (REST AND PHARMACOLOGIC-STRESS)  GATED LEFT VENTRICULAR WALL MOTION STUDY  LEFT VENTRICULAR EJECTION FRACTION  TECHNIQUE: Standard myocardial SPECT imaging was performed after  resting intravenous injection of 10 mCi Tc-51m sestamibi. Subsequently, intravenous infusion of Lexiscan was performed under the supervision of the Cardiology staff. At peak effect of the drug, 30 mCi Tc-68m sestamibi was injected intravenously and standard myocardial SPECT imaging was performed. Quantitative gated imaging was also performed to evaluate left ventricular wall motion, and estimate left ventricular ejection fraction.  COMPARISON:  None.  FINDINGS: Perfusion: Small, mild reversible defect on the stress images at the lateral/inferior wall towards the base.  Wall Motion: Normal left ventricular wall motion. No left ventricular dilation.  Left Ventricular Ejection Fraction: 60 %  End diastolic volume 76 ml  End systolic volume 30 ml  IMPRESSION: 1. Small, mild reversible defect at the lateral/ inferior wall at the base.  2. Normal left ventricular wall motion.  3. Left ventricular ejection fraction 60%  4. Low-risk stress test findings*.  *2012 Appropriate Use Criteria for Coronary Revascularization Focused Update: J Am Coll Cardiol. N6492421. http://content.airportbarriers.com.aspx?articleid=1201161   Electronically Signed   By: Corrie Mckusick D.O.   On: 02/12/2015 13:02    Microbiology: No results found for this or any previous visit (from the past 240 hour(s)).   Labs: Basic Metabolic Panel:  Recent Labs Lab 02/08/15 1644 02/09/15 0521 02/11/15 0557  NA 139 137 143  K 4.2 3.7 3.5  CL 103 102 106  CO2 29 29 29   GLUCOSE 106* 93 94  BUN 11 10 15   CREATININE 1.00 0.96 1.04  CALCIUM 9.6 9.2 9.5   Liver Function Tests:  Recent Labs Lab 02/08/15 1644  AST 15  ALT 11  ALKPHOS 67  BILITOT 0.8  PROT 6.4  ALBUMIN 3.3*   No results for input(s): LIPASE, AMYLASE in the last 168 hours. No results for input(s): AMMONIA in the last 168 hours. CBC:  Recent Labs Lab 02/06/15 0725 02/08/15 1644 02/09/15 0521 02/11/15 0557  WBC 8.5 7.6 5.3 5.5  NEUTROABS  --  6.1   --   --   HGB 11.1* 12.2 11.1* 11.9*  HCT 35.8* 38.2 35.3* 37.7  MCV 90.9 88.8 89.8 90.6  PLT 130* 193 178 212   Cardiac Enzymes:  Recent Labs Lab 02/08/15 2337 02/09/15 0521 02/09/15 1215  TROPONINI 0.03 0.04* 0.08*   BNP: BNP (last 3 results)  Recent Labs  02/08/15 2337  BNP 67.3    ProBNP (last 3 results) No results for input(s): PROBNP in the last 8760 hours.  CBG:  Recent Labs Lab 02/09/15 0737 02/10/15 0806 02/11/15 0759 02/12/15 0804  GLUCAP 88 119* 110* 117*       Signed:  Trong Gosling  [facility] 02/12/2015, 3:19 PM

## 2015-02-12 NOTE — Progress Notes (Signed)
SATURATION QUALIFICATIONS: (This note is used to comply with regulatory documentation for home oxygen)  Patient Saturations on Room Air at Rest = 99%  Patient Saturations on Room Air while Ambulating = 95-100%  Patient Saturations on 0 Liters of oxygen while Ambulating = %  Please briefly explain why patient needs home oxygen: pt does not need home oxygen.

## 2015-02-12 NOTE — Progress Notes (Signed)
Patient Name: Jessica Taylor Date of Encounter: 02/12/2015   Principal Problem:   Hypoxia Active Problems:   Elevated troponin   Mixed hyperlipidemia   Essential hypertension, benign   CAD (coronary artery disease)   GERD (gastroesophageal reflux disease)   Hypothyroidism   Primary osteoarthritis of right knee    SUBJECTIVE  No chest pain or sob.  Echo nl yesterday.  Scheduled for MV today.  CURRENT MEDS . amLODipine  10 mg Oral QAC breakfast  . aspirin EC  325 mg Oral BID PC  . omega-3 acid ethyl esters  1 g Oral Daily   And  . cholecalciferol  1,000 Units Oral Daily  . heparin  5,000 Units Subcutaneous 3 times per day  . levothyroxine  50 mcg Oral QAC breakfast  . magnesium hydroxide  30 mL Oral Daily  . metoprolol tartrate  25 mg Oral Daily  . pantoprazole  40 mg Oral Daily  . polyethylene glycol  17 g Oral Daily  . rosuvastatin  20 mg Oral Daily  . senna-docusate  2 tablet Oral BID  . sodium chloride  3 mL Intravenous Q12H    OBJECTIVE  Filed Vitals:   02/11/15 1421 02/11/15 2142 02/12/15 0606 02/12/15 0943  BP: 126/56 124/53 147/59 150/63  Pulse:  68 76 65  Temp: 98.4 F (36.9 C) 98.4 F (36.9 C) 98.8 F (37.1 C)   TempSrc: Oral Oral Oral   Resp: 16 16 15    Height:      Weight:   173 lb 15.1 oz (78.9 kg)   SpO2: 95% 97% 91%     Intake/Output Summary (Last 24 hours) at 02/12/15 1046 Last data filed at 02/12/15 0915  Gross per 24 hour  Intake    123 ml  Output      0 ml  Net    123 ml   Filed Weights   02/10/15 0553 02/11/15 0500 02/12/15 0606  Weight: 171 lb 1.2 oz (77.6 kg) 171 lb 15.3 oz (78 kg) 173 lb 15.1 oz (78.9 kg)    PHYSICAL EXAM  General: Pleasant, NAD. Neuro: Alert and oriented X 3. Moves all extremities spontaneously. Psych: Normal affect. HEENT:  Normal  Neck: Supple without bruits or JVD. Lungs:  Resp regular and unlabored, CTA. Heart: RRR no s3, s4, or murmurs. Abdomen: Soft, non-tender, non-distended, BS + x 4.    Extremities: No clubbing, cyanosis or edema. DP/PT/Radials 2+ and equal bilaterally.  Accessory Clinical Findings  CBC  Recent Labs  02/11/15 0557  WBC 5.5  HGB 11.9*  HCT 37.7  MCV 90.6  PLT 99991111   Basic Metabolic Panel  Recent Labs  02/11/15 0557  NA 143  K 3.5  CL 106  CO2 29  GLUCOSE 94  BUN 15  CREATININE 1.04  CALCIUM 9.5   Cardiac Enzymes  Recent Labs  02/09/15 1215  TROPONINI 0.08*   TELE  Seen in nuc med.  Radiology/Studies  Dg Chest 2 View  02/08/2015   CLINICAL DATA:  74 year old female status post recent right knee surgery. Increase shortness of breath, fever, weakness. Initial encounter.  EXAM: CHEST  2 VIEW  COMPARISON:  12/28/2014.  FINDINGS: Seated AP and lateral views of the chest. Lower lung volumes. Stable mild cardiomegaly. Other mediastinal contours are within normal limits. Visualized tracheal air column is within normal limits. No pneumothorax or pulmonary edema. There is increased patchy opacity at the left lung base. No definite pleural effusion. No other confluent pulmonary opacity. Calcified atherosclerosis  of the aorta. No acute osseous abnormality identified.  IMPRESSION: Lower lung volumes with patchy opacity at the left lung base suspicious for developing pneumonia in this setting. No pleural effusion identified.   Electronically Signed   By: Genevie Ann M.D.   On: 02/08/2015 17:34   Ct Angio Chest Pe W/cm &/or Wo Cm  02/08/2015   CLINICAL DATA:  Knee surgery 02/04/15, has been de-sating since then, denies chest pain. Low oxygen saturation.  EXAM: CT ANGIOGRAPHY CHEST WITH CONTRAST  TECHNIQUE: Multidetector CT imaging of the chest was performed using the standard protocol during bolus administration of intravenous contrast. Multiplanar CT image reconstructions and MIPs were obtained to evaluate the vascular anatomy.  CONTRAST:  133mL OMNIPAQUE IOHEXOL 350 MG/ML SOLN  COMPARISON:  Current chest radiograph.  FINDINGS: There is no evidence of a  pulmonary embolus. Heart is top-normal in size. There are dense coronary artery calcifications. The great vessels are normal in caliber. No aortic dissection. No neck base, axillary, mediastinal or hilar masses or pathologically enlarged lymph nodes.  Reticular opacities are noted at the lung bases and in the dependent lower lobes consistent with atelectasis. No lung consolidation to suggest pneumonia. No pulmonary edema. There is no pleural effusion or pneumothorax.  Small sliding hiatal hernia. Limited evaluation of the upper abdomen is otherwise unremarkable.  Bony thorax is demineralized. Mild degenerative changes noted along the thoracic spine. No osteoblastic or osteolytic lesions.  Review of the MIP images confirms the above findings.  IMPRESSION: 1. No evidence of a pulmonary embolus. 2. Subsegmental atelectasis noted in the dependent lower lobes and at the lung bases. No evidence of pneumonia or pulmonary edema.   Electronically Signed   By: Lajean Manes M.D.   On: 02/08/2015 20:53    ASSESSMENT AND PLAN  1.  Hypoxia/elevated troponin:  Improved.  Echo yesterday showed nl lv fxn.  She is scheduled for a lexiscan cardiolite today.  2.  CAD:  No chest pain.  CL today as above.  3. HTN:  Stable.  Signed, Murray Hodgkins NP  Patient seen, examined. Available data reviewed. Agree with findings, assessment, and plan as outlined by Ignacia Bayley, NP. The patient feels well. She has ambulated without further hypoxemia demonstrated and will not require O2 at discharge. Echo and Myoview reviewed. Normal echo and Low risk Myoview with mild ischemia at inferolateral base. The patient has known CAD and there has been no change in her anginal pattern over time. She is currently not experiencing any accelerating symptoms. I would favor ongoing medical therapy in setting of a low-risk nuclear scan. Meds reviewed and are appropriate. OK for d/c from a cardiac standpoint. Will arrange FU with Dr Marlou Porch or his  PA/NP. Plan d/w patient and husband.  Sherren Mocha, M.D. 02/12/2015 3:11 PM

## 2015-02-13 NOTE — Care Management Note (Signed)
    Page 1 of 1   02/13/2015     5:35:32 PM CARE MANAGEMENT NOTE 02/13/2015  Patient:  Jessica Taylor, Jessica Taylor   Account Number:  [id number]  Date Initiated:  02/13/2015  Documentation initiated by:  Tomi Bamberger  Subjective/Objective Assessment:   admit- lives with spouse.     Action/Plan:   Anticipated DC Date:  02/12/2015   Anticipated DC Plan:  [place]  CM consult      [facility] Choice  HOME HEALTH   Choice offered to / List presented to:  C-1 Patient        [place] arranged  [facility]      [place].   Status of service:  Completed, signed off Medicare Important Message given?  YES (If response is "NO", the following Medicare IM given date fields will be blank) Date Medicare IM given:  02/11/2015 Medicare IM given by:  Tomi Bamberger Date Additional Medicare IM given:   Additional Medicare IM given by:    Discharge Disposition:  [place]  Per UR Regulation:  Reviewed for med. necessity/level of care/duration of stay  If discussed at [place] of Stay Meetings, dates discussed:    Comments:  02/13/15 [place], BSN [phone] patient chose [facility] for hhpt, referral made to [facility], [place] notified. soc will begin 24-48hrs post dc.

## 2015-03-04 ENCOUNTER — Ambulatory Visit: Payer: Medicare Other | Admitting: Physician Assistant

## 2015-03-06 ENCOUNTER — Encounter (HOSPITAL_COMMUNITY)
Admission: EM | Disposition: A | Discharge status: Home Health | Payer: Medicare Other | Source: Home / Self Care | Attending: Cardiovascular Disease

## 2015-03-06 ENCOUNTER — Inpatient Hospital Stay (HOSPITAL_COMMUNITY)
Admission: EM | Admit: 2015-03-06 | Discharge: 2015-03-08 | DRG: 246 | Disposition: A | Discharge status: Home Health | Payer: Medicare Other | Attending: Cardiovascular Disease | Admitting: Cardiovascular Disease

## 2015-03-06 ENCOUNTER — Encounter (HOSPITAL_COMMUNITY): Payer: Self-pay

## 2015-03-06 ENCOUNTER — Emergency Department (HOSPITAL_COMMUNITY): Payer: Medicare Other

## 2015-03-06 DIAGNOSIS — K219 Gastro-esophageal reflux disease without esophagitis: Secondary | ICD-10-CM | POA: Diagnosis present

## 2015-03-06 DIAGNOSIS — M179 Osteoarthritis of knee, unspecified: Secondary | ICD-10-CM | POA: Diagnosis present

## 2015-03-06 DIAGNOSIS — E782 Mixed hyperlipidemia: Secondary | ICD-10-CM | POA: Diagnosis present

## 2015-03-06 DIAGNOSIS — I213 ST elevation (STEMI) myocardial infarction of unspecified site: Secondary | ICD-10-CM | POA: Diagnosis not present

## 2015-03-06 DIAGNOSIS — I10 Essential (primary) hypertension: Secondary | ICD-10-CM | POA: Diagnosis present

## 2015-03-06 DIAGNOSIS — I739 Peripheral vascular disease, unspecified: Secondary | ICD-10-CM | POA: Diagnosis present

## 2015-03-06 DIAGNOSIS — I2119 ST elevation (STEMI) myocardial infarction involving other coronary artery of inferior wall: Secondary | ICD-10-CM | POA: Diagnosis present

## 2015-03-06 DIAGNOSIS — Z91013 Allergy to seafood: Secondary | ICD-10-CM | POA: Diagnosis not present

## 2015-03-06 DIAGNOSIS — I719 Aortic aneurysm of unspecified site, without rupture: Secondary | ICD-10-CM | POA: Diagnosis present

## 2015-03-06 DIAGNOSIS — I2121 ST elevation (STEMI) myocardial infarction involving left circumflex coronary artery: Secondary | ICD-10-CM | POA: Diagnosis not present

## 2015-03-06 DIAGNOSIS — E876 Hypokalemia: Secondary | ICD-10-CM | POA: Diagnosis present

## 2015-03-06 DIAGNOSIS — N289 Disorder of kidney and ureter, unspecified: Secondary | ICD-10-CM

## 2015-03-06 DIAGNOSIS — R739 Hyperglycemia, unspecified: Secondary | ICD-10-CM | POA: Diagnosis present

## 2015-03-06 DIAGNOSIS — I2510 Atherosclerotic heart disease of native coronary artery without angina pectoris: Secondary | ICD-10-CM

## 2015-03-06 DIAGNOSIS — Z79899 Other long term (current) drug therapy: Secondary | ICD-10-CM

## 2015-03-06 DIAGNOSIS — Z87891 Personal history of nicotine dependence: Secondary | ICD-10-CM | POA: Diagnosis not present

## 2015-03-06 DIAGNOSIS — I255 Ischemic cardiomyopathy: Secondary | ICD-10-CM | POA: Diagnosis present

## 2015-03-06 DIAGNOSIS — Z9861 Coronary angioplasty status: Secondary | ICD-10-CM | POA: Diagnosis not present

## 2015-03-06 DIAGNOSIS — I25110 Atherosclerotic heart disease of native coronary artery with unstable angina pectoris: Secondary | ICD-10-CM | POA: Diagnosis not present

## 2015-03-06 DIAGNOSIS — E039 Hypothyroidism, unspecified: Secondary | ICD-10-CM | POA: Diagnosis present

## 2015-03-06 DIAGNOSIS — Y831 Surgical operation with implant of artificial internal device as the cause of abnormal reaction of the patient, or of later complication, without mention of misadventure at the time of the procedure: Secondary | ICD-10-CM | POA: Diagnosis present

## 2015-03-06 DIAGNOSIS — Z7982 Long term (current) use of aspirin: Secondary | ICD-10-CM

## 2015-03-06 DIAGNOSIS — I700 Atherosclerosis of aorta: Secondary | ICD-10-CM | POA: Diagnosis not present

## 2015-03-06 DIAGNOSIS — R079 Chest pain, unspecified: Secondary | ICD-10-CM | POA: Diagnosis present

## 2015-03-06 DIAGNOSIS — I252 Old myocardial infarction: Secondary | ICD-10-CM | POA: Diagnosis not present

## 2015-03-06 DIAGNOSIS — T82857A Stenosis of cardiac prosthetic devices, implants and grafts, initial encounter: Principal | ICD-10-CM | POA: Diagnosis present

## 2015-03-06 DIAGNOSIS — I7409 Other arterial embolism and thrombosis of abdominal aorta: Secondary | ICD-10-CM | POA: Diagnosis present

## 2015-03-06 HISTORY — PX: LEFT HEART CATHETERIZATION WITH CORONARY ANGIOGRAM: SHX5451

## 2015-03-06 HISTORY — PX: CORONARY STENT PLACEMENT: SHX1402

## 2015-03-06 HISTORY — DX: Tobacco use: Z72.0

## 2015-03-06 LAB — CBC WITH DIFFERENTIAL/PLATELET
Basophils Absolute: 0 10*3/uL (ref 0.0–0.1)
Basophils Relative: 1 % (ref 0–1)
EOS ABS: 0.2 10*3/uL (ref 0.0–0.7)
Eosinophils Relative: 2 % (ref 0–5)
HCT: 39 % (ref 36.0–46.0)
HEMOGLOBIN: 12.5 g/dL (ref 12.0–15.0)
Lymphocytes Relative: 20 % (ref 12–46)
Lymphs Abs: 1.6 10*3/uL (ref 0.7–4.0)
MCH: 29.3 pg (ref 26.0–34.0)
MCHC: 32.1 g/dL (ref 30.0–36.0)
MCV: 91.3 fL (ref 78.0–100.0)
Monocytes Absolute: 0.5 10*3/uL (ref 0.1–1.0)
Monocytes Relative: 7 % (ref 3–12)
Neutro Abs: 5.7 10*3/uL (ref 1.7–7.7)
Neutrophils Relative %: 70 % (ref 43–77)
PLATELETS: 166 10*3/uL (ref 150–400)
RBC: 4.27 MIL/uL (ref 3.87–5.11)
RDW: 14.7 % (ref 11.5–15.5)
WBC: 8 10*3/uL (ref 4.0–10.5)

## 2015-03-06 LAB — MRSA PCR SCREENING: MRSA BY PCR: NEGATIVE

## 2015-03-06 LAB — COMPREHENSIVE METABOLIC PANEL
ALT: 19 U/L (ref 0–35)
AST: 20 U/L (ref 0–37)
Albumin: 3.9 g/dL (ref 3.5–5.2)
Alkaline Phosphatase: 82 U/L (ref 39–117)
Anion gap: 8 (ref 5–15)
BUN: 19 mg/dL (ref 6–23)
CHLORIDE: 107 mmol/L (ref 96–112)
CO2: 25 mmol/L (ref 19–32)
CREATININE: 1.1 mg/dL (ref 0.50–1.10)
Calcium: 9.3 mg/dL (ref 8.4–10.5)
GFR calc Af Amer: 55 mL/min — ABNORMAL LOW (ref >90)
GFR calc non Af Amer: 48 mL/min — ABNORMAL LOW (ref >90)
Glucose, Bld: 140 mg/dL — ABNORMAL HIGH (ref 70–99)
Potassium: 3.4 mmol/L — ABNORMAL LOW (ref 3.5–5.1)
Sodium: 140 mmol/L (ref 135–145)
Total Bilirubin: 0.4 mg/dL (ref 0.3–1.2)
Total Protein: 6.3 g/dL (ref 6.0–8.3)

## 2015-03-06 LAB — TROPONIN I
TROPONIN I: 7.51 ng/mL — AB (ref <0.031)
TROPONIN I: 41.32 ng/mL — AB (ref <0.031)
TROPONIN I: 35.3 ng/mL — AB (ref <0.031)
Troponin I: <0.03 ng/mL (ref <0.031)

## 2015-03-06 LAB — PROTIME-INR
INR: 1.25 (ref 0.00–1.49)
PROTHROMBIN TIME: 15.9 s — AB (ref 11.6–15.2)

## 2015-03-06 LAB — T4, FREE: FREE T4: 1 ng/dL (ref 0.80–1.80)

## 2015-03-06 LAB — POCT ACTIVATED CLOTTING TIME: Activated Clotting Time: 429 seconds

## 2015-03-06 LAB — I-STAT TROPONIN, ED: TROPONIN I, POC: 0.01 ng/mL (ref 0.00–0.08)

## 2015-03-06 LAB — APTT: aPTT: 30 seconds (ref 24–37)

## 2015-03-06 LAB — TSH: TSH: 0.844 u[IU]/mL (ref 0.350–4.500)

## 2015-03-06 SURGERY — LEFT HEART CATHETERIZATION WITH CORONARY ANGIOGRAM
Anesthesia: LOCAL

## 2015-03-06 MED ORDER — HEPARIN BOLUS VIA INFUSION
4000.0000 [IU] | Freq: Once | INTRAVENOUS | Status: AC
  Administered 2015-03-06: 4000 [IU] via INTRAVENOUS

## 2015-03-06 MED ORDER — BIVALIRUDIN 250 MG IV SOLR
INTRAVENOUS | Status: AC
  Filled 2015-03-06: qty 250

## 2015-03-06 MED ORDER — POTASSIUM CHLORIDE CRYS ER 20 MEQ PO TBCR
40.0000 meq | EXTENDED_RELEASE_TABLET | Freq: Once | ORAL | Status: AC
  Administered 2015-03-06: 40 meq via ORAL
  Filled 2015-03-06: qty 2

## 2015-03-06 MED ORDER — TICAGRELOR 90 MG PO TABS
ORAL_TABLET | ORAL | Status: AC
  Filled 2015-03-06: qty 2

## 2015-03-06 MED ORDER — SODIUM CHLORIDE 0.9 % IV SOLN
0.2500 mg/kg/h | INTRAVENOUS | Status: DC
  Filled 2015-03-06: qty 250

## 2015-03-06 MED ORDER — METOPROLOL TARTRATE 25 MG PO TABS
25.0000 mg | ORAL_TABLET | Freq: Every day | ORAL | Status: DC
Start: 2015-03-06 — End: 2015-03-06

## 2015-03-06 MED ORDER — ROSUVASTATIN CALCIUM 20 MG PO TABS
20.0000 mg | ORAL_TABLET | Freq: Every day | ORAL | Status: DC
  Administered 2015-03-06 – 2015-03-08 (×3): 20 mg via ORAL
  Filled 2015-03-06 (×3): qty 1

## 2015-03-06 MED ORDER — TICAGRELOR 90 MG PO TABS
90.0000 mg | ORAL_TABLET | Freq: Two times a day (BID) | ORAL | Status: DC
  Administered 2015-03-06 – 2015-03-08 (×4): 90 mg via ORAL
  Filled 2015-03-06 (×5): qty 1

## 2015-03-06 MED ORDER — HYDROCODONE-ACETAMINOPHEN 5-325 MG PO TABS
1.0000 | ORAL_TABLET | ORAL | Status: DC | PRN
  Administered 2015-03-06 – 2015-03-07 (×2): 2 via ORAL
  Filled 2015-03-06 (×2): qty 2

## 2015-03-06 MED ORDER — ACETAMINOPHEN 325 MG PO TABS
650.0000 mg | ORAL_TABLET | ORAL | Status: DC | PRN
Start: 2015-03-06 — End: 2015-03-08

## 2015-03-06 MED ORDER — AMLODIPINE BESYLATE 5 MG PO TABS
5.0000 mg | ORAL_TABLET | Freq: Every day | ORAL | Status: DC
  Administered 2015-03-06 – 2015-03-08 (×3): 5 mg via ORAL
  Filled 2015-03-06 (×3): qty 1

## 2015-03-06 MED ORDER — METHOCARBAMOL 500 MG PO TABS
500.0000 mg | ORAL_TABLET | Freq: Three times a day (TID) | ORAL | Status: DC | PRN
  Filled 2015-03-06: qty 1

## 2015-03-06 MED ORDER — HEPARIN (PORCINE) IN NACL 2-0.9 UNIT/ML-% IJ SOLN
INTRAMUSCULAR | Status: AC
  Filled 2015-03-06: qty 1000

## 2015-03-06 MED ORDER — SODIUM CHLORIDE 0.9 % IV SOLN
INTRAVENOUS | Status: AC
  Administered 2015-03-06: 75 mL/h via INTRAVENOUS

## 2015-03-06 MED ORDER — ASPIRIN 81 MG PO CHEW
81.0000 mg | CHEWABLE_TABLET | Freq: Every day | ORAL | Status: DC
  Administered 2015-03-06 – 2015-03-08 (×3): 81 mg via ORAL
  Filled 2015-03-06 (×3): qty 1

## 2015-03-06 MED ORDER — NITROGLYCERIN 0.4 MG SL SUBL: 0.4000 mg | SUBLINGUAL_TABLET | SUBLINGUAL | Status: DC | PRN

## 2015-03-06 MED ORDER — ONDANSETRON HCL 4 MG/2ML IJ SOLN: 4.0000 mg | Freq: Four times a day (QID) | INTRAMUSCULAR | Status: DC | PRN

## 2015-03-06 MED ORDER — LEVOTHYROXINE SODIUM 50 MCG PO TABS
50.0000 ug | ORAL_TABLET | Freq: Every day | ORAL | Status: DC
  Administered 2015-03-06 – 2015-03-08 (×3): 50 ug via ORAL
  Filled 2015-03-06 (×4): qty 1

## 2015-03-06 MED ORDER — NITROGLYCERIN 1 MG/10 ML FOR IR/CATH LAB
INTRA_ARTERIAL | Status: AC
  Filled 2015-03-06: qty 10

## 2015-03-06 MED ORDER — LIDOCAINE HCL (PF) 1 % IJ SOLN
INTRAMUSCULAR | Status: AC
  Filled 2015-03-06: qty 30

## 2015-03-06 MED ORDER — SODIUM CHLORIDE 0.9 % IV SOLN
0.2500 mg/kg/h | INTRAVENOUS | Status: AC
  Administered 2015-03-06: 0.25 mg/kg/h via INTRAVENOUS
  Filled 2015-03-06: qty 250

## 2015-03-06 MED ORDER — HEPARIN (PORCINE) IN NACL 100-0.45 UNIT/ML-% IJ SOLN
INTRAMUSCULAR | Status: AC
  Administered 2015-03-06: 25000 [IU]
  Filled 2015-03-06: qty 250

## 2015-03-06 MED ORDER — LISINOPRIL 5 MG PO TABS
5.0000 mg | ORAL_TABLET | Freq: Every day | ORAL | Status: DC
  Administered 2015-03-06 – 2015-03-08 (×3): 5 mg via ORAL
  Filled 2015-03-06 (×3): qty 1

## 2015-03-06 MED ORDER — AMLODIPINE BESYLATE 10 MG PO TABS
10.0000 mg | ORAL_TABLET | Freq: Every day | ORAL | Status: DC
Start: 2015-03-06 — End: 2015-03-06
  Filled 2015-03-06: qty 1

## 2015-03-06 MED ORDER — PANTOPRAZOLE SODIUM 40 MG PO TBEC
40.0000 mg | DELAYED_RELEASE_TABLET | Freq: Every day | ORAL | Status: DC
  Administered 2015-03-06 – 2015-03-08 (×3): 40 mg via ORAL
  Filled 2015-03-06 (×2): qty 1

## 2015-03-06 MED ORDER — CARVEDILOL 6.25 MG PO TABS
6.2500 mg | ORAL_TABLET | Freq: Two times a day (BID) | ORAL | Status: DC
  Administered 2015-03-06 – 2015-03-08 (×5): 6.25 mg via ORAL
  Filled 2015-03-06 (×8): qty 1

## 2015-03-06 MED ORDER — ATROPINE SULFATE 0.1 MG/ML IJ SOLN
INTRAMUSCULAR | Status: AC
  Filled 2015-03-06: qty 10

## 2015-03-06 MED ORDER — MORPHINE SULFATE 2 MG/ML IJ SOLN: 2.0000 mg | INTRAMUSCULAR | Status: DC | PRN

## 2015-03-06 NOTE — ED Notes (Signed)
Patient via [facility] c/o chest pain that started 3 days ago. Patient states she had knee surgery last month and has had shortness of breath since. PTA patient took 648mg  ASA and 2 NTG at home. [facility] gave 1 NTG.

## 2015-03-06 NOTE — ED Provider Notes (Signed)
CSN: [id number]     Arrival date & time 03/06/15  0353 History   First MD Initiated Contact with Patient 03/06/15 0400     Chief Complaint  Patient presents with  . Chest Pain     (Consider location/radiation/quality/duration/timing/severity/associated sxs/prior Treatment) HPI Patient with a history of coronary artery disease with 5 stents. She's been having episodic chest pain for the past few days. These are relieved with nitroglycerin at home. Patient states she woke around 2 AM with worsening chest pressure radiating into her neck. She states she took 2 nitroglycerin at home and 640 mg of aspirin with no relief. She called EMS and was given one nitroglycerin en route. She recently was hospitalized for hypoxia and had a stress test done at that time due to a minimally elevated troponin. She states that the results were normal. Her pain is now 5 of 10. Past Medical History  Diagnosis Date  . Acid reflux   . Thyroid disease   . MI, old 89  . Hypercholesteremia   . Hypothyroidism   . Vertigo     when lays on left side.  . Cancer     basal cell on nose  . Anemia   . Coronary artery disease   . Hypertension     Dr. Marlou Porch oversees her cardiac care.   . Arthritis     knees , R shoulder - tx /w injection - 11/2014   Past Surgical History  Procedure Laterality Date  . Coronary stent placement    . Cardiac catheterization  5 stents  . Tubal ligation    . Cataract extraction w/phaco Left 05/24/2014    Procedure: CATARACT EXTRACTION PHACO AND INTRAOCULAR LENS PLACEMENT (IOC);  Surgeon: Tonny Branch, MD;  Location: [facility];  Service: Ophthalmology;  Laterality: Left;  CDE:  9.30  . Cataract extraction w/phaco Right 06/18/2014    Procedure: CATARACT EXTRACTION PHACO AND INTRAOCULAR LENS PLACEMENT RIGHT EYE CDE=10.84;  Surgeon: Tonny Branch, MD;  Location: [facility];  Service: Ophthalmology;  Laterality: Right;  . Eye surgery    . Tonsillectomy      age 54  . Partial knee arthroplasty Right  02/04/2015    Procedure: UNICOMPARTMENTAL KNEE;  Surgeon: Dorna Leitz, MD;  Location: [facility];  Service: Orthopedics;  Laterality: Right;   Family History  Problem Relation Age of Onset  . Cancer Mother 20  . Cancer Father 48   History  Substance Use Topics  . Smoking status: Former Smoker -- 1.50 packs/day for 42 years    Types: Cigarettes    Quit date: 05/19/1999  . Smokeless tobacco: Never Used  . Alcohol Use: No   OB History    No data available     Review of Systems  Constitutional: Negative for fever and chills.  Respiratory: Positive for shortness of breath. Negative for cough.   Cardiovascular: Positive for chest pain. Negative for palpitations and leg swelling.  Gastrointestinal: Negative for nausea, vomiting and abdominal pain.  Musculoskeletal: Negative for back pain, neck pain and neck stiffness.  Skin: Negative for rash and wound.  Neurological: Negative for dizziness, seizures, weakness, light-headedness and numbness.  All other systems reviewed and are negative.     Allergies  Shrimp  Home Medications   Prior to Admission medications   Medication Sig Start Date End Date Taking? Authorizing Provider  amLODipine (NORVASC) 10 MG tablet Take 10 mg by mouth daily before breakfast.     Historical Provider, MD  aspirin EC 325 MG tablet  Take 1 tablet (325 mg total) by mouth 2 (two) times daily after a meal. Take x 1 month post op to decrease risk of blood clots. 02/04/15   Gary Fleet, PA-C  Fish Oil-Cholecalciferol (FISH OIL + D3 PO) Take 1 capsule by mouth daily.    Historical Provider, MD  HYDROcodone-acetaminophen (NORCO/VICODIN) 5-325 MG per tablet Take 1-2 tablets by mouth every 4 (four) hours as needed for severe pain. 02/06/15   Gary Fleet, PA-C  Ketotifen Fumarate (THERA TEARS ALLERGY OP) Apply 1 drop to eye 2 (two) times daily as needed (dry eyes).    Historical Provider, MD  levothyroxine (SYNTHROID, LEVOTHROID) 50 MCG tablet Take 50 mcg by mouth daily  before breakfast.    Historical Provider, MD  methocarbamol (ROBAXIN) 500 MG tablet Take 1 tablet (500 mg total) by mouth every 8 (eight) hours as needed for muscle spasms. 02/04/15   Gary Fleet, PA-C  metoprolol tartrate (LOPRESSOR) 25 MG tablet Take 25 mg by mouth daily. 11/02/13   Jerline Pain, MD  NEXIUM 40 MG capsule TAKE ONE CAPSULE BY MOUTH ONCE DAILY AT Alois Cliche, MD  rosuvastatin (CRESTOR) 20 MG tablet Take 1 tablet (20 mg total) by mouth daily. 08/17/14   Jerline Pain, MD   BP 141/66 mmHg  Pulse 66  SpO2 96% Physical Exam  Constitutional: She is oriented to person, place, and time. She appears well-developed and well-nourished. No distress.  HENT:  Head: Normocephalic and atraumatic.  Mouth/Throat: Oropharynx is clear and moist.  Eyes: EOM are normal. Pupils are equal, round, and reactive to light.  Neck: Normal range of motion. Neck supple.  Cardiovascular: Normal rate and regular rhythm.  Exam reveals no gallop and no friction rub.   No murmur heard. Pulmonary/Chest: Effort normal and breath sounds normal. No respiratory distress. She has no wheezes. She has no rales.  Abdominal: Soft. Bowel sounds are normal.  Musculoskeletal: Normal range of motion. She exhibits no edema or tenderness.  No calf swelling or tenderness.  Neurological: She is alert and oriented to person, place, and time.  Moves all extremities without deficit. Sensation is grossly intact.  Skin: Skin is warm and dry. No rash noted. No erythema.  Psychiatric: She has a normal mood and affect. Her behavior is normal.  Nursing note and vitals reviewed.   ED Course  Procedures (including critical care time) Labs Review Labs Reviewed  COMPREHENSIVE METABOLIC PANEL - Abnormal; Notable for the following:    Potassium 3.4 (*)    Glucose, Bld 140 (*)    GFR calc non Af Amer 48 (*)    GFR calc Af Amer 55 (*)    All other components within normal limits  PROTIME-INR - Abnormal; Notable for the  following:    Prothrombin Time 15.9 (*)    All other components within normal limits  CBC WITH DIFFERENTIAL/PLATELET  TROPONIN I  APTT  I-STAT TROPOININ, ED    Imaging Review Dg Chest Port 1 View  03/06/2015   CLINICAL DATA:  Chest pain beginning this morning. History of Coronary artery disease, myocardial infarction in reflux disease.  EXAM: PORTABLE CHEST - 1 VIEW  COMPARISON:  Chest radiograph February 08, 2015  FINDINGS: Cardiac silhouette is upper limits of normal in size, mediastinal silhouette is nonsuspicious. Calcified aortic knob. Coronary artery stent in place. No pleural effusion or focal consolidation. No pneumothorax. Soft tissue planes and included osseous structure are nonsuspicious.  IMPRESSION: Borderline cardiomegaly, no acute pulmonary process.  Electronically Signed   By: Elon Alas   On: 03/06/2015 04:58     EKG Interpretation   Date/Time:  Wednesday March 06 2015 04:04:13 EDT Ventricular Rate:  68 PR Interval:  200 QRS Duration: 90 QT Interval:  412 QTC Calculation: 438 R Axis:   15 Text Interpretation:  Sinus rhythm Inferoposterior infarct, acute (LCx)  Lateral leads are also involved Confirmed by Lita Mains  MD, Stephone Gum ([id number])  on 03/06/2015 4:07:11 AM     CRITICAL CARE Performed by: Lita Mains, Bettey Muraoka Total critical care time: 20 min Critical care time was exclusive of separately billable procedures and treating other patients. Critical care was necessary to treat or prevent imminent or life-threatening deterioration. Critical care was time spent personally by me on the following activities: development of treatment plan with patient and/or surrogate as well as nursing, discussions with consultants, evaluation of patient's response to treatment, examination of patient, obtaining history from patient or surrogate, ordering and performing treatments and interventions, ordering and review of laboratory studies, ordering and review of radiographic studies,  pulse oximetry and re-evaluation of patient's condition.  MDM   Final diagnoses:  Chest pain  ST elevation myocardial infarction (STEMI), unspecified artery    EKG with inferior ST elevation and reciprocal changes in V1 and V2. Code STEMI called at [facility]. Discussed with cardiology fellow, Dr. Philbert Riser who spoke with Dr. Gwenlyn Found. They're currently in the Cath Lab and advised sending the patient to the emergency department. Discuss with Dr. Kathrynn Humble. He will accept the patient in transfer. Heparin initiated in the emergency department per pharmacy.    Julianne Rice, MD 03/06/15 [phone]

## 2015-03-06 NOTE — Care Management Note (Addendum)
    Page 1 of 1   03/08/2015     11:44:56 AM CARE MANAGEMENT NOTE 03/08/2015  Patient:  Jessica Taylor, Jessica Taylor   Account Number:  [id number]  Date Initiated:  03/06/2015  Documentation initiated by:  Elissa Hefty  Subjective/Objective Assessment:   adm w mi     Action/Plan:   lives w spouse,   Anticipated DC Date:  03/08/2015   Anticipated DC Plan:  [place]  CM consult  Medication Assistance      Choice offered to / List presented to:             Status of service:  Completed, signed off Medicare Important Message given?  NA - LOS <3 / Initial given by admissions (If response is "NO", the following Medicare IM given date fields will be blank) Date Medicare IM given:   Medicare IM given by:   Date Additional Medicare IM given:   Additional Medicare IM given by:    Discharge Disposition:  HOME/SELF CARE  Per UR Regulation:  Reviewed for med. necessity/level of care/duration of stay  If discussed at [place] of Stay Meetings, dates discussed:    Comments:  4/13 0918 debbie dowell rn,bsn gave pt 30day free brilinta card. 45.00 per month copay for brilinta.

## 2015-03-06 NOTE — ED Notes (Signed)
Code Stemi called by Dr Lita Mains @ [phone]. [facility] notified

## 2015-03-06 NOTE — CV Procedure (Signed)
Jessica Taylor is a 74 y.o. female    [id number] LOCATION:  FACILITY: [facility]  PHYSICIAN: Quay Burow, M.D. 1941-07-03   DATE OF PROCEDURE:  03/06/2015  DATE OF DISCHARGE:     CARDIAC CATHETERIZATION     History obtained from chart review. Jessica Taylor is a 74 year old female with a past history of coronary stenting, hypertension and hyperlipidemia. She was hospitalized last month with hypoxia of unclear etiology. She did have a total knee replacement prior to that admission. CT abdomen was negative for PE. A 2-D echo was normal. A Myoview stress test showed mild inferobasal ischemia and she was deemed stable to be discharged home and treated medically. She's had off-and-on chest pain the last several days. She had chest pain this morning at 2 AM which awakened her from sleep and she was brought to [facility] emergency room where her EKG showed inferolateral ST segment elevation. She was transferred by EMS to [facility] for urgent intervention   PROCEDURE DESCRIPTION:   The patient was brought to the second floor [facility] cath lab in the postabsorptive state. She was not premedicated . Her right groinwas prepped and shaved in usual sterile fashion. Xylocaine 1% was used for local anesthesia. A 6 French sheath was inserted into the right common femoral artery using standard Seldinger technique. 6 French right and left Judkins diagnostic catheters along with a 6 French pigtail catheter were used for selective coronary angiography and left ventriculography respectively. Visipaque dye was used for the entirety of the case. Retrograde aortic, left ventricular pullback pressures were recorded.   HEMODYNAMICS:    AO SYSTOLIC/AO DIASTOLIC: 324/40   LV SYSTOLIC/LV DIASTOLIC: 102/7  ANGIOGRAPHIC RESULTS:   1. Left main; minor irregularities  2. LAD; there was 60% "in-stent restenosis" within the proximal LAD stent with a fairly focal 70% stenosis of the distal edge  on a bend. 3. Left circumflex; the circumflex was the infarct-related artery and had total occlusion in the distal AV groove. There was a moderate sized ramus branch that 70% "in-stent restenosis within the proximal stent..  4. Right coronary artery; dominant with 60% stenosis in the mid to distal portion. The distal stent had moderate in-stent restenosis in the 50% range with an 80% stenosis at the "crux just before the takeoff of the PDA. 5. Left ventriculography; RAO left ventriculogram was performed using  25 mL of Visipaque dye at 12 mL/second. The overall LVEF estimated  35-40% with severe inferior and lateral hypokinesia %  With/Without wall motion abnormalities 6. Distal abdominal aortogram-small distal dominant aneurysm, 95% calcified ostial bilateral common iliac artery stenoses with moderate post stenotic dilatation in the right common iliac artery. There was a 50 mm transstenotic gradient.  IMPRESSION:Jessica Taylor has an occluded circumflex in the midportion with in-stent restenosis within the RCA, LAD and ramus branch stents. We will proceed with PCI and stenting using Angiomax, Brilenta and drug eluding stent.  Procedure description: The patient received Angiomax bolus with an ACT of 429. Total contrast Mr. To the patient was 190 mL I was unable to pass a 6 [place] XB 3.0 cm guide catheter across the right common iliac origin and therefore changed sheaths, 6 French, 35 cm long sheath which I was able to pass over an 035 wire into the distal abdominal aorta. I crossed the lesion with an 014/190 cm long water guidewire, predilated with a 2 mm x 12 mm balloon and stented the circumflex with a 2.5 mm x 15  mm long Xience drug-eluting stent deployed at 15 atm. I postdilated with a 2.75 mm x 12 mm long noncompliant stenting 18 atm (2.83 mm) resulting reduction with total occlusion to 0% residual with excellent flow and no dissection. The patient tolerated the procedure well. The guidewire and catheter  were removed.  Overall impression: Successful circumflex PCI and stenting in this setting of an acute inferolateral myocardial infarction with moderate LV dysfunction and diffuse "in-stent restenosis within the distal RCA, proximal ramus and LAD stent. The remainder of her coronary anatomy will be treated medically at this time. She'll need lifelong antibiotic therapy. We will continue Angiomax for 4 hours before this. The patient will ultimately require intervention on her iliac arteries. I can see her back in the office for her peripheral vascular evaluation when she has recovered from her acute coronary event.  Lorretta Harp MD, [facility] 03/06/2015 6:46 AM

## 2015-03-06 NOTE — H&P (Signed)
History and Physical  Patient ID: Jessica Taylor MRN: [id number], DOB: 08/13/1941 Date of Encounter: 03/06/2015, 8:49 AM Primary Physician: [name], MD Primary Cardiologist: Dr. Bronson Ing   Chief Complaint: chest pain Reason for Admission: STEMI  HPI: Jessica Taylor is a 74 y/o F with history of CAD s/p PCI, HTN, HLD, hypothyroidism, former tobacco abuse and GERD who was admitted with STEMI. Per review of chart she has a history of multiple PCIs but details unclear, not presently available in EPIC. No hx TIA/stroke. More recent history includes admission for hypoxia following knee replacement of unclear etiology. CTA was negative for PE. She had mild troponin elevation of 0.08 but denied any recent angina. Lexiscan nuclear stress test was low risk showing small, mild reversible defect at the lateral/ inferior wall at the base, normal wall motion, EF 60%. Given lack of anginal symptoms, medical therapy was recommended. 2D echo showed normal EF and no RWMA, grade 1 DD. Hypoxia resolved before discharge.   She returned to the hospital early this morning with several day history of worsening chest pain. This morning she had an episode of discomfort that awoke her at 2AM. She also had SOB. She was brought to [facility] where EKG showed inferolateral ST segment elevation. She was transferred by EMS to [facility] for urgent intervention. She was found to have occluded circumflex in the midportion with in-stent restenosis within the RCA, LAD and ramus branch stents. She underwent successful circumflex PCI and DES placement. The remainder of her coronary anatomy will be treated medically at this time. She was treated with Brilinta and Dr. Gwenlyn Found feels she will require this lifelong. She was also found to have PAD with 95% calcified ostial bilateral common iliac artery stenoses with moderate post stenotic dilatation in the right common iliac artery with 85mm transstenotic gradient. Dr. Gwenlyn Found plans to see her  back in the office for PV evaluation (intervention on iliacs) once she has recovered from her acute coronary event. LVEF 35-40% by cath.  Labs notable for initial troponin negative, K 3.4, glu 140, CBC OK. CXR borderline cardiomegaly, no acute pulm process. Currently feels comfortable. Lying flat in bed.  Past Medical History  Diagnosis Date  . Acid reflux   . Hypothyroidism   . 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 Hypercholesteremia   . Vertigo     when lays on left side.  . Cancer     basal cell on nose  . Anemia   . Coronary artery disease   . Hypertension   . Arthritis     knees , R shoulder - tx /w injection - 11/2014  . Tobacco abuse      Most Recent Cardiac Studies: See above re: nuc. No cath available. 2D echo 02/11/15 - Left ventricle: The cavity size was normal. Wall thickness was increased in a pattern of mild LVH. Systolic function was normal. The estimated ejection fraction was in the range of 60% to 65%. Wall motion was normal; there were no regional wall motion abnormalities. Doppler parameters are consistent with abnormal left ventricular relaxation (grade 1 diastolic dysfunction).   Surgical History:  Past Surgical History  Procedure Laterality Date  . Coronary stent placement    . Cardiac catheterization  5 stents  . Tubal ligation    . Cataract extraction w/phaco Left 05/24/2014    Procedure: CATARACT EXTRACTION PHACO AND INTRAOCULAR LENS PLACEMENT (IOC);  Surgeon: Tonny Branch, MD;  Location: [facility];  Service: Ophthalmology;  Laterality: Left;  CDE:  9.30  . Cataract extraction w/phaco Right 06/18/2014    Procedure: CATARACT EXTRACTION PHACO AND INTRAOCULAR LENS PLACEMENT RIGHT EYE CDE=10.84;  Surgeon: Tonny Branch, MD;  Location: [facility];  Service: Ophthalmology;  Laterality: Right;  . Eye surgery    . Tonsillectomy      age 13  . Partial knee arthroplasty Right 02/04/2015    Procedure: UNICOMPARTMENTAL KNEE;  Surgeon: Dorna Leitz, MD;  Location: [place];  Service:  Orthopedics;  Laterality: Right;     Home Meds: Prior to Admission medications   Medication Sig Start Date End Date Taking? Authorizing Provider  amLODipine (NORVASC) 10 MG tablet Take 10 mg by mouth daily before breakfast.     Historical Provider, MD  aspirin EC 325 MG tablet Take 1 tablet (325 mg total) by mouth 2 (two) times daily after a meal. Take x 1 month post op to decrease risk of blood clots. 02/04/15   Gary Fleet, PA-C  Fish Oil-Cholecalciferol (FISH OIL + D3 PO) Take 1 capsule by mouth daily.    Historical Provider, MD  HYDROcodone-acetaminophen (NORCO/VICODIN) 5-325 MG per tablet Take 1-2 tablets by mouth every 4 (four) hours as needed for severe pain. 02/06/15   Gary Fleet, PA-C  Ketotifen Fumarate (THERA TEARS ALLERGY OP) Apply 1 drop to eye 2 (two) times daily as needed (dry eyes).    Historical Provider, MD  levothyroxine (SYNTHROID, LEVOTHROID) 50 MCG tablet Take 50 mcg by mouth daily before breakfast.    Historical Provider, MD  methocarbamol (ROBAXIN) 500 MG tablet Take 1 tablet (500 mg total) by mouth every 8 (eight) hours as needed for muscle spasms. 02/04/15   Gary Fleet, PA-C  metoprolol tartrate (LOPRESSOR) 25 MG tablet Take 25 mg by mouth daily. 11/02/13   Jerline Pain, MD  NEXIUM 40 MG capsule TAKE ONE CAPSULE BY MOUTH ONCE DAILY AT Alois Cliche, MD  rosuvastatin (CRESTOR) 20 MG tablet Take 1 tablet (20 mg total) by mouth daily. 08/17/14   Jerline Pain, MD    Allergies:  Allergies  Allergen Reactions  . Shrimp [Shellfish Allergy]     Throws up violently    History   Social History  . Marital Status: Married    Spouse Name: N/A  . Number of Children: N/A  . Years of Education: N/A   Occupational History  . Not on file.   Social History Main Topics  . Smoking status: Former Smoker -- 1.50 packs/day for 42 years    Types: Cigarettes    Quit date: 05/19/1999  . Smokeless tobacco: Never Used  . Alcohol Use: No  . Drug Use: No  . Sexual  Activity: Yes    Birth Control/ Protection: Surgical   Other Topics Concern  . Not on file   Social History Narrative     Family History  Problem Relation Age of Onset  . Cancer Mother 25  . Cancer Father 61    Review of Systems: General: negative for chills, fever Cardiovascular: see above Dermatological: negative for rash Respiratory: negative for wheezing Urologic: negative for hematuria Abdominal: negative for bright red blood per rectum, melena, or hematemesis All other systems reviewed and are otherwise negative except as noted above.  Labs:   Lab Results  Component Value Date   WBC 8.0 03/06/2015   HGB 12.5 03/06/2015   HCT 39.0 03/06/2015   MCV 91.3 03/06/2015   PLT 166 03/06/2015    Recent Labs Lab 03/06/15 0415  NA 140  K 3.4*  CL 107  CO2 25  BUN 19  CREATININE 1.10  CALCIUM 9.3  PROT 6.3  BILITOT 0.4  ALKPHOS 82  ALT 19  AST 20  GLUCOSE 140*    Recent Labs  03/06/15 0415  TROPONINI <0.03   Lab Results  Component Value Date   CHOL 138 02/09/2015   HDL 33* 02/09/2015   LDLCALC 76 02/09/2015   TRIG 146 02/09/2015   No results found for: DDIMER  Radiology/Studies:   Ct Angio Chest Pe W/cm &/or Wo Cm  02/08/2015   CLINICAL DATA:  Knee surgery 02/04/15, has been de-sating since then, denies chest pain. Low oxygen saturation.  EXAM: CT ANGIOGRAPHY CHEST WITH CONTRAST  TECHNIQUE: Multidetector CT imaging of the chest was performed using the standard protocol during bolus administration of intravenous contrast. Multiplanar CT image reconstructions and MIPs were obtained to evaluate the vascular anatomy.  CONTRAST:  193mL OMNIPAQUE IOHEXOL 350 MG/ML SOLN  COMPARISON:  Current chest radiograph.  FINDINGS: There is no evidence of a pulmonary embolus. Heart is top-normal in size. There are dense coronary artery calcifications. The great vessels are normal in caliber. No aortic dissection. No neck base, axillary, mediastinal or hilar masses or  pathologically enlarged lymph nodes.  Reticular opacities are noted at the lung bases and in the dependent lower lobes consistent with atelectasis. No lung consolidation to suggest pneumonia. No pulmonary edema. There is no pleural effusion or pneumothorax.  Small sliding hiatal hernia. Limited evaluation of the upper abdomen is otherwise unremarkable.  Bony thorax is demineralized. Mild degenerative changes noted along the thoracic spine. No osteoblastic or osteolytic lesions.  Review of the MIP images confirms the above findings.  IMPRESSION: 1. No evidence of a pulmonary embolus. 2. Subsegmental atelectasis noted in the dependent lower lobes and at the lung bases. No evidence of pneumonia or pulmonary edema.   Electronically Signed   By: Lajean Manes M.D.   On: 02/08/2015 20:53   Dg Chest Port 1 View  03/06/2015   CLINICAL DATA:  Chest pain beginning this morning. History of Coronary artery disease, myocardial infarction in reflux disease.  EXAM: PORTABLE CHEST - 1 VIEW  COMPARISON:  Chest radiograph February 08, 2015  FINDINGS: Cardiac silhouette is upper limits of normal in size, mediastinal silhouette is nonsuspicious. Calcified aortic knob. Coronary artery stent in place. No pleural effusion or focal consolidation. No pneumothorax. Soft tissue planes and included osseous structure are nonsuspicious.  IMPRESSION: Borderline cardiomegaly, no acute pulmonary process.   Electronically Signed   By: Elon Alas   On: 03/06/2015 04:58   Wt Readings from Last 3 Encounters:  03/06/15 174 lb 2.6 oz (79 kg)  02/12/15 173 lb 15.1 oz (78.9 kg)  02/04/15 172 lb (78.019 kg)   EKG: NSR acute inferolateral STEMI with ST elevation II, III, avF, V6 (max 1.4mm)  Physical Exam: Blood pressure 138/57, pulse 75, temperature 98.6 F (37 C), temperature source Oral, resp. rate 15, height 5\' 4"  (1.626 m), weight 174 lb 2.6 oz (79 kg), SpO2 100 %. General: Well developed, well nourished WF, in no acute distress.  Lying flat in bed Head: Normocephalic, atraumatic, sclera non-icteric, no xanthomas, nares are without discharge.  Neck: JVD not elevated. Lungs: Clear bilaterally to auscultation without wheezes, rales, or rhonchi. Breathing is unlabored. Heart: RRR with S1 S2. No murmurs, rubs, or gallops appreciated. Abdomen: Soft, non-tender, non-distended with normoactive bowel sounds. No hepatomegaly. No rebound/guarding. No obvious abdominal masses. Msk:  Strength and tone appear  normal for age. Extremities: No clubbing or cyanosis. No edema.  Distal pedal pulses are 2+ and equal bilaterally. Neuro: Alert and oriented X 3. No focal deficit. No facial asymmetry. Moves all extremities spontaneously. Psych:  Responds to questions appropriately with a normal affect.    ASSESSMENT AND PLAN:  1. CAD with inferolateral STEMI s/p PCI as above - Brilinta added this admission. Continue ASA, statin. Changing BB to Coreg due to LV dysfunction. Cycle troponins.  2. Ischemic cardiomyopathy EF 35-40% - no current symptoms of CHF. Follow volume status. Per d/w Dr. Gwenlyn Found will d/c metoprolol and start Coreg 6.25mg  BID and start lisinopril 5mg  daily. To accomodate for these changes will decrease amlodipine to 5mg  daily and follow BP. 2D echo has already been ordered. 3. HTN - follow BP with med changes. 4. Hyperlipidemia - continue statin and check lipids in AM. 5. Hypokalemia - give 88meq x 1 and follow with addition of ACEI. 6. Hyperglycemia - check A1C. 7. PAD - will need OP eval once recovered from acute event.  Signed, Melina Copa PA-C 03/06/2015, 8:49 AM     Agree with findings by Melina Copa PA-C  The patient was transferred from [facility] with acute inferolateral myocardial infarction. She had a low risk Myoview several weeks before. She presents for emergent coronary angiography and potential intervention. She is symptomatically stable with ongoing chest pain and EKG changes.   Lorretta Harp,  M.D., [place], [facility], [place]ver[place] [address] [place],   [address]  [phone] 03/07/2015 8:15 AM

## 2015-03-07 ENCOUNTER — Encounter (HOSPITAL_COMMUNITY): Payer: Self-pay | Admitting: Cardiovascular Disease

## 2015-03-07 DIAGNOSIS — I213 ST elevation (STEMI) myocardial infarction of unspecified site: Secondary | ICD-10-CM

## 2015-03-07 DIAGNOSIS — I739 Peripheral vascular disease, unspecified: Secondary | ICD-10-CM

## 2015-03-07 DIAGNOSIS — I25110 Atherosclerotic heart disease of native coronary artery with unstable angina pectoris: Secondary | ICD-10-CM

## 2015-03-07 DIAGNOSIS — I2510 Atherosclerotic heart disease of native coronary artery without angina pectoris: Secondary | ICD-10-CM

## 2015-03-07 DIAGNOSIS — I255 Ischemic cardiomyopathy: Secondary | ICD-10-CM

## 2015-03-07 DIAGNOSIS — I2119 ST elevation (STEMI) myocardial infarction involving other coronary artery of inferior wall: Secondary | ICD-10-CM

## 2015-03-07 LAB — CBC
HCT: 40.5 % (ref 36.0–46.0)
Hemoglobin: 12.7 g/dL (ref 12.0–15.0)
MCH: 28.3 pg (ref 26.0–34.0)
MCHC: 31.4 g/dL (ref 30.0–36.0)
MCV: 90.4 fL (ref 78.0–100.0)
Platelets: 176 10*3/uL (ref 150–400)
RBC: 4.48 MIL/uL (ref 3.87–5.11)
RDW: 15 % (ref 11.5–15.5)
WBC: 5.7 10*3/uL (ref 4.0–10.5)

## 2015-03-07 LAB — BASIC METABOLIC PANEL
Anion gap: 8 (ref 5–15)
BUN: 14 mg/dL (ref 6–23)
CO2: 27 mmol/L (ref 19–32)
Calcium: 9.9 mg/dL (ref 8.4–10.5)
Chloride: 107 mmol/L (ref 96–112)
Creatinine, Ser: 1.29 mg/dL — ABNORMAL HIGH (ref 0.50–1.10)
GFR calc Af Amer: 45 mL/min — ABNORMAL LOW (ref >90)
GFR calc non Af Amer: 39 mL/min — ABNORMAL LOW (ref >90)
Glucose, Bld: 111 mg/dL — ABNORMAL HIGH (ref 70–99)
Potassium: 4.6 mmol/L (ref 3.5–5.1)
Sodium: 142 mmol/L (ref 135–145)

## 2015-03-07 LAB — PROTIME-INR
INR: 1.11 (ref 0.00–1.49)
Prothrombin Time: 14.5 seconds (ref 11.6–15.2)

## 2015-03-07 LAB — LIPID PANEL
Cholesterol: 166 mg/dL (ref 0–200)
HDL: 31 mg/dL — ABNORMAL LOW (ref >39)
LDL Cholesterol: 97 mg/dL (ref 0–99)
Total CHOL/HDL Ratio: 5.4 RATIO
Triglycerides: 188 mg/dL — ABNORMAL HIGH (ref <150)
VLDL: 38 mg/dL (ref 0–40)

## 2015-03-07 MED FILL — Sodium Chloride IV Soln 0.9%: INTRAVENOUS | Qty: 50 | Status: AC

## 2015-03-07 NOTE — Progress Notes (Signed)
  Echocardiogram 2D Echocardiogram has been performed.  Darlina Sicilian M 03/07/2015, 1:34 PM

## 2015-03-07 NOTE — Progress Notes (Addendum)
SUBJECTIVE: No chest pain or SOB.   Tele: NSR  BP 109/55 mmHg  Pulse 63  Temp(Src) 98.5 F (36.9 C) (Oral)  Resp 15  Ht 5\' 4"  (1.626 m)  Wt 162 lb 7.7 oz (73.7 kg)  BMI 27.88 kg/m2  SpO2 93%  Intake/Output Summary (Last 24 hours) at 03/07/15 0915 Last data filed at 03/06/15 2100  Gross per 24 hour  Intake    679 ml  Output    600 ml  Net     79 ml    PHYSICAL EXAM General: Well developed, well nourished, in no acute distress. Alert and oriented x 3.  Psych:  Good affect, responds appropriately Neck: No JVD. No masses noted.  Lungs: Clear bilaterally with no wheezes or rhonci noted.  Heart: RRR with no murmurs noted. Abdomen: Bowel sounds are present. Soft, non-tender.  Extremities: No lower extremity edema.   LABS: Basic Metabolic Panel:  Recent Labs  03/06/15 0415 03/07/15 0415  NA 140 142  K 3.4* 4.6  CL 107 107  CO2 25 27  GLUCOSE 140* 111*  BUN 19 14  CREATININE 1.10 1.29*  CALCIUM 9.3 9.9   CBC:  Recent Labs  03/06/15 0415 03/07/15 0415  WBC 8.0 5.7  NEUTROABS 5.7  --   HGB 12.5 12.7  HCT 39.0 40.5  MCV 91.3 90.4  PLT 166 176   Cardiac Enzymes:  Recent Labs  03/06/15 0922 03/06/15 1300 03/06/15 1932  TROPONINI 7.51* 41.32* 35.30*   Fasting Lipid Panel:  Recent Labs  03/07/15 0415  CHOL 166  HDL 31*  LDLCALC 97  TRIG 188*  CHOLHDL 5.4    Current Meds: . amLODipine  5 mg Oral Daily  . aspirin  81 mg Oral Daily  . carvedilol  6.25 mg Oral BID WC  . levothyroxine  50 mcg Oral QAC breakfast  . lisinopril  5 mg Oral Daily  . pantoprazole  40 mg Oral Daily  . rosuvastatin  20 mg Oral Daily  . ticagrelor  90 mg Oral BID   Cardiac cath [id number]: AO SYSTOLIC/AO DIASTOLIC: [id number] LV SYSTOLIC/LV DIASTOLIC: [id number] ANGIOGRAPHIC RESULTS:  1. Left main; minor irregularities  2. LAD; there was 60% "in-stent restenosis" within the proximal LAD stent with a fairly focal 70% stenosis of the distal edge on a bend. 3. Left  circumflex; the circumflex was the infarct-related artery and had total occlusion in the distal AV groove. There was a moderate sized ramus branch that 70% "in-stent restenosis within the proximal stent..  4. Right coronary artery; dominant with 60% stenosis in the mid to distal portion. The distal stent had moderate in-stent restenosis in the 50% range with an 80% stenosis at the "crux just before the takeoff of the PDA. 5. Left ventriculography; RAO left ventriculogram was performed using  25 mL of Visipaque dye at 12 mL/second. The overall LVEF estimated  35-40% with severe inferior and lateral hypokinesia % With/Without wall motion abnormalities 6. Distal abdominal aortogram-small distal dominant aneurysm, 95% calcified ostial bilateral common iliac artery stenoses with moderate post stenotic dilatation in the right common iliac artery. There was a 50 mm transstenotic gradient.  ASSESSMENT AND PLAN:  1. CAD/Acute Inferolateral STEMI: Pt admitted 03/06/15 with acute inferolateral STEMI. Cardiac cath per Dr. Gwenlyn Found with occluded Circumflex. She has moderate disease in the previously stented LAD and RCA. She is stable this am. Will continue ASA, Brilinta, statin, beta blocker, Ace-inh. Echo pending today.   2. HTN: BP controlled. Continue  current therapy  3. HLD: She is on a statin.   4. PAD: She was found to have severe stenosis in the ostia of both common iliac arteries. Dr. Gwenlyn Found would like to see her back in follow up to discuss intervention in several weeks.   5. Ischemic cardiomyopathy: Continue medical management. Echo today. Her last LVEF on 02/11/15 was 60-65%. LV gram with LVEF=35-40%.    Transfer to telemetry today. Will have long term follow up with Dr. Marlou Porch and PV follow up with Dr. Gwenlyn Found. Probable d/c home 03/08/15.   MCALHANY,CHRISTOPHER  4/14/20169:15 AM

## 2015-03-07 NOTE — Progress Notes (Signed)
CARDIAC REHAB PHASE I   PRE:  Rate/Rhythm: 48 SR  BP:  Supine: 84/51  Sitting: 97/47  Standing:    SaO2:   MODE:  Ambulation: 250 ft   POST:  Rate/Rhythm: 75 SR  BP:  Supine:   Sitting: 109/38  Standing:    SaO2:  1055-1210 Assisted X 1 and used walker to ambulate. Gait steady with walker. Pt c/o of pain in left buttock walking and states that this has been giving her a lot of pain at home. BP low in bed, sitting on side of bed BP improved. She did c/o of slight dizziness sitting and after walk. BP after walk 109/38. Pt to recliner with call light in reach after walk. Completed MI and stent education with pt and daughter. She voices understanding. Pt declines Outpt. CRP at present due to need for physical therapy for her right knee and also for need for follow up for her leg blockages. Pt was suppose to start Outpt. Physical therapy Wednesday for her knee.  Rodney Langton RN 03/07/2015 12:17 PM

## 2015-03-08 ENCOUNTER — Telehealth: Payer: Self-pay | Admitting: Cardiology

## 2015-03-08 ENCOUNTER — Telehealth: Payer: Self-pay | Admitting: Cardiovascular Disease

## 2015-03-08 ENCOUNTER — Telehealth: Payer: Self-pay | Admitting: Nurse Practitioner

## 2015-03-08 ENCOUNTER — Encounter (HOSPITAL_COMMUNITY): Payer: Self-pay | Admitting: Cardiology

## 2015-03-08 DIAGNOSIS — Z9861 Coronary angioplasty status: Secondary | ICD-10-CM

## 2015-03-08 DIAGNOSIS — I10 Essential (primary) hypertension: Secondary | ICD-10-CM

## 2015-03-08 DIAGNOSIS — N289 Disorder of kidney and ureter, unspecified: Secondary | ICD-10-CM

## 2015-03-08 LAB — BASIC METABOLIC PANEL
Anion gap: 12 (ref 5–15)
BUN: 22 mg/dL (ref 6–23)
CALCIUM: 9.8 mg/dL (ref 8.4–10.5)
CO2: 21 mmol/L (ref 19–32)
Chloride: 107 mmol/L (ref 96–112)
Creatinine, Ser: 1.34 mg/dL — ABNORMAL HIGH (ref 0.50–1.10)
GFR calc non Af Amer: 37 mL/min — ABNORMAL LOW (ref >90)
GFR, EST AFRICAN AMERICAN: 43 mL/min — AB (ref >90)
GLUCOSE: 108 mg/dL — AB (ref 70–99)
POTASSIUM: 4 mmol/L (ref 3.5–5.1)
Sodium: 140 mmol/L (ref 135–145)

## 2015-03-08 LAB — CBC
HCT: 39.5 % (ref 36.0–46.0)
Hemoglobin: 12.4 g/dL (ref 12.0–15.0)
MCH: 28.4 pg (ref 26.0–34.0)
MCHC: 31.4 g/dL (ref 30.0–36.0)
MCV: 90.6 fL (ref 78.0–100.0)
Platelets: 158 10*3/uL (ref 150–400)
RBC: 4.36 MIL/uL (ref 3.87–5.11)
RDW: 15.4 % (ref 11.5–15.5)
WBC: 6.3 10*3/uL (ref 4.0–10.5)

## 2015-03-08 MED ORDER — NITROGLYCERIN 0.4 MG SL SUBL: 0.4000 mg | SUBLINGUAL_TABLET | SUBLINGUAL | Status: DC | PRN

## 2015-03-08 MED ORDER — TICAGRELOR 90 MG PO TABS: 90.0000 mg | ORAL_TABLET | Freq: Two times a day (BID) | ORAL | Status: DC

## 2015-03-08 MED ORDER — ACETAMINOPHEN 325 MG PO TABS: 650.0000 mg | ORAL_TABLET | ORAL | Status: DC | PRN

## 2015-03-08 NOTE — Discharge Summary (Signed)
Patient ID: Jessica Taylor,  MRN: [id number], DOB/AGE: 1940-12-30 74 y.o.  Admit date: 03/06/2015 Discharge date: 03/08/2015  Primary Care Provider: [name], MD Primary Cardiologist: Dr Marlou Porch Dr Gwenlyn Found [facility]  Discharge Diagnoses Principal Problem:   STEMI- urgent CFX PCI 03/06/15 Active Problems:   CAD S/P prior PCI to LAD and RCA   Essential hypertension   Aortoiliac occlusive disease   Acute renal insufficiency post PCI- SCr1.34   Mixed hyperlipidemia   ICM 35% at cath but EF 60% by echo 03/07/15   Hypokalemia   Hyperglycemia    Procedures: Urgent cath/ CFX PCI with DES 03/06/15   Hospital Course:  74 year old female followed by Dr Marlou Porch with a past history of coronary stenting, hypertension and hyperlipidemia. She was hospitalized in March 2016 with hypoxia of unclear etiology a week or so after Rt TKR.. CT was negative for PE. 2-D echo was normal. A Myoview stress test showed mild inferobasal ischemia But low risk. She was deemed stable to be discharged home and treated medically. She's had off-and-on chest pain for several days preceding her admission 03/06/15. She had chest pain at 2 AM 03/06/15 which awakened her from sleep and she was brought to [facility] emergency room where her EKG showed inferolateral ST segment elevation. She was transferred to [facility] and taken to the cath lab by Dr Gwenlyn Found. She had a 60% "in-stent restenosis" within the proximal LAD stent with a fairly focal 70% stenosis of the distal edge on a bend. The circumflex was the infarct-related artery and had a total occlusion in the distal AV groove. There was a moderate sized ramus branch that 70% "in-stent restenosis within the proximal stent. The dominant RCA had a 60% stenosis in the mid to distal portion. The distal stent had moderate in-stent restenosis in the 50% range with an 80% stenosis at the "crux just before the takeoff of the PDA. Her EF at cath was 35-40% with severe inferior and lateral  hypokinesia. Distal abdominal aortogram-small distal dominant aneurysm, 95% calcified ostial bilateral common iliac artery stenoses with moderate post stenotic dilatation in the right common iliac artery. There was a 50 mm transstenotic gradient. The patient underwent successful circumflex PCI and stenting in this setting of an acute inferolateral myocardial infarction with moderate LV dysfunction and diffuse "in-stent restenosis within the distal RCA, proximal ramus and LAD stent. The remainder of her coronary anatomy will be treated medically at this time. She'll need lifelong antibiotic therapy. We will continue Angiomax for 4 hours before this. The patient will ultimately require intervention on her iliac arteries. I can see her back in the office for her peripheral vascular evaluation when she has recovered from her acute coronary event. Echocardiogram done 03/07/15 showed her EF to be 60%. She did have a slight bump in her SCr to 1.34 after her PCI. She had been placed on an ACE after her cath revealed LVD. This will be held for now. She'll need a follow up BMP as an OP.   Discharge Vitals:  Blood pressure 120/57, pulse 73, temperature 98.6 F (37 C), temperature source Oral, resp. rate 18, height 5\' 4"  (1.626 m), weight 164 lb 7.4 oz (74.6 kg), SpO2 95 %.    Labs: Results for orders placed or performed during the hospital encounter of 03/06/15 (from the past 24 hour(s))  CBC     Status: None   Collection Time: 03/08/15  3:55 AM  Result Value Ref Range   WBC 6.3 4.0 -  10.5 K/uL   RBC 4.36 3.87 - 5.11 MIL/uL   Hemoglobin 12.4 12.0 - 15.0 g/dL   HCT 39.5 36.0 - 46.0 %   MCV 90.6 78.0 - 100.0 fL   MCH 28.4 26.0 - 34.0 pg   MCHC 31.4 30.0 - 36.0 g/dL   RDW 15.4 11.5 - 15.5 %   Platelets 158 150 - 400 K/uL  Basic metabolic panel     Status: Abnormal   Collection Time: 03/08/15  3:55 AM  Result Value Ref Range   Sodium 140 135 - 145 mmol/L   Potassium 4.0 3.5 - 5.1 mmol/L   Chloride 107 96 -  112 mmol/L   CO2 21 19 - 32 mmol/L   Glucose, Bld 108 (H) 70 - 99 mg/dL   BUN 22 6 - 23 mg/dL   Creatinine, Ser 1.34 (H) 0.50 - 1.10 mg/dL   Calcium 9.8 8.4 - 10.5 mg/dL   GFR calc non Af Amer 37 (L) >90 mL/min   GFR calc Af Amer 43 (L) >90 mL/min   Anion gap 12 5 - 15    Disposition:  Follow-up Information    Follow up with Lorretta Harp, MD.   Specialty:  Cardiology   Why:  office will call you   Contact information:   [address] [place] [address] [phone]       Follow up with Candee Furbish, MD.   Specialty:  Cardiology   Why:  office will call you to been seen in 7-10 days   Contact information:   [address] [place] [address] [phone]       Discharge Medications:    Medication List    TAKE these medications        acetaminophen 325 MG tablet  Commonly known as:  TYLENOL  Take 2 tablets (650 mg total) by mouth every 4 (four) hours as needed for headache or mild pain.     amLODipine 10 MG tablet  Commonly known as:  NORVASC  Take 10 mg by mouth daily before breakfast.     aspirin EC 81 MG tablet  Take 81 mg by mouth at bedtime.     FISH OIL + D3 PO  Take 1 capsule by mouth daily.     HYDROcodone-acetaminophen 5-325 MG per tablet  Commonly known as:  NORCO/VICODIN  Take 1-2 tablets by mouth every 4 (four) hours as needed for severe pain.     levothyroxine 50 MCG tablet  Commonly known as:  SYNTHROID, LEVOTHROID  Take 50 mcg by mouth daily before breakfast.     methocarbamol 500 MG tablet  Commonly known as:  ROBAXIN  Take 1 tablet (500 mg total) by mouth every 8 (eight) hours as needed for muscle spasms.     metoprolol tartrate 25 MG tablet  Commonly known as:  LOPRESSOR  Take 25 mg by mouth daily.     NEXIUM 40 MG capsule  Generic drug:  esomeprazole  TAKE ONE CAPSULE BY MOUTH ONCE DAILY AT NOON     nitroGLYCERIN 0.4 MG SL tablet  Commonly known as:  NITROSTAT  Place 1 tablet (0.4 mg  total) under the tongue every 5 (five) minutes x 3 doses as needed for chest pain.     rosuvastatin 20 MG tablet  Commonly known as:  CRESTOR  Take 1 tablet (20 mg total) by mouth daily.     THERA TEARS ALLERGY OP  Apply 1 drop to eye 2 (two) times  daily as needed (dry eyes).     ticagrelor 90 MG Tabs tablet  Commonly known as:  BRILINTA  Take 1 tablet (90 mg total) by mouth 2 (two) times daily.         Duration of Discharge Encounter: Greater than 30 minutes including physician time.  Angelena Form PA-C 03/08/2015 11:02 AM

## 2015-03-08 NOTE — Progress Notes (Signed)
Discharged to home with family office visits in place teaching done  

## 2015-03-08 NOTE — Telephone Encounter (Signed)
error 

## 2015-03-08 NOTE — Discharge Instructions (Signed)
Ticagrelor oral tablet What is this medicine? TICAGRELOR (TYE ka GREL or) helps to prevent blood clots. This medicine is used to prevent heart attack, stroke, or other vascular events in people who have had a recent heart attack or who have severe chest pain. This medicine may be used for other purposes; ask your health care provider or pharmacist if you have questions. COMMON BRAND NAME(S): BRILINTA What should I tell my health care provider before I take this medicine? They need to know if you have any of these conditions: -bleeding disorder -bleeding in the brain -liver disease -planned surgery -stomach or intestinal ulcers -stroke or transient ischemic attack -an unusual or allergic reaction to ticagrelor, other medicines, foods, dyes, or preservatives -pregnant or trying to get pregnant -breast-feeding How should I use this medicine? Take this medicine by mouth with a glass of water. Follow the directions on the prescription label. You can take it with or without food. If it upsets your stomach, take it with food. Take your medicine at regular intervals. Do not take it more often than directed. Do not stop taking except on your doctor's advice. Talk to you pediatrician regarding the use of this medicine in children. Special care may be needed. Overdosage: If you think you've taken too much of this medicine contact a poison control center or emergency room at once. Overdosage: If you think you have taken too much of this medicine contact a poison control center or emergency room at once. NOTE: This medicine is only for you. Do not share this medicine with others. What if I miss a dose? If you miss a dose, take it as soon as you can. If it is almost time for your next dose, take only that dose. Do not take double or extra doses. What may interact with this medicine? -certain antibiotics like clarithromycin and telithromycin -certain medicines for fungal infections like itraconazole,  ketoconazole, and voriconazole -certain medicines for HIV infection like atazanavir, indinavir, nelfinavir, ritonavir, and saquinavir -certain medicines for seizures like carbamazepine, phenobarbital, and phenytoin -certain medicines that treat or prevent blood clots like warfarin -dexamethasone -digoxin -lovastatin -nefazodone -rifampin -simvastatin This list may not describe all possible interactions. Give your health care provider a list of all the medicines, herbs, non-prescription drugs, or dietary supplements you use. Also tell them if you smoke, drink alcohol, or use illegal drugs. Some items may interact with your medicine. What should I watch for while using this medicine? Visit your doctor or health care professional for regular check ups. Do not stop taking you medicine unless your doctor tells you to. Notify your doctor or health care professional and seek emergency treatment if you develop breathing problems; changes in vision; chest pain; severe, sudden headache; pain, swelling, warmth in the leg; trouble speaking; sudden numbness or weakness of the face, arm, or leg. These can be signs that your condition has gotten worse. If you are going to have surgery or dental work, tell your doctor or health care professional that you are taking this medicine. You should take aspirin every day with this medicine. Do not take more than 100 mg each day. Talk to your doctor if you have questions. What side effects may I notice from receiving this medicine? Side effects that you should report to your doctor or health care professional as soon as possible: -allergic reactions like skin rash, itching or hives, swelling of the face, lips, or tongue -breathing problems -fast or irregular heartbeat -feeling faint or light-headed, falls -signs and  symptoms of bleeding such as bloody or black, tarry stools; red or dark-brown urine; spitting up blood or brown material that looks like coffee grounds;  red spots on the skin; unusual bruising or bleeding from the eye, gums, or nose Side effects that usually do not require medical attention (Report these to your doctor or health care professional if they continue or are bothersome.): -breast enlargement in both males and females -diarrhea -dizziness -headache -tiredness -upset stomach This list may not describe all possible side effects. Call your doctor for medical advice about side effects. You may report side effects to FDA at 1-800-FDA-1088. Where should I keep my medicine? Keep out of the reach of children. Store at room temperature of 59 to 86 degrees F (15 to 30 degrees C). Throw away any unused medicine after the expiration date. NOTE: This sheet is a summary. It may not cover all possible information. If you have questions about this medicine, talk to your doctor, pharmacist, or health care provider.  2015, Elsevier/Gold Standard. (2014-02-19 08:31:23) Coronary Angiogram With Stent, Care After Refer to this sheet in the next few weeks. These instructions provide you with information on caring for yourself after your procedure. Your health care provider may also give you more specific instructions. Your treatment has been planned according to current medical practices, but problems sometimes occur. Call your health care provider if you have any problems or questions after your procedure.  WHAT TO EXPECT AFTER THE PROCEDURE  The insertion site may be tender for a few days after your procedure. HOME CARE INSTRUCTIONS   Take medicines only as directed by your health care provider. Blood thinners may be prescribed after your procedure to improve blood flow through the stent.  Change any bandages (dressings) as directed by your health care provider.   Check your insertion site every day for redness, swelling, or fluid leaking from the insertion.   Do not take baths, swim, or use a hot tub until your health care provider approves. You  may shower. Pat the insertion area dry. Do not rub the insertion area with a washcloth or towel.   Eat a heart-healthy diet. This should include plenty of fresh fruits and vegetables. Meat should be lean cuts. Avoid the following types of food:   Food that is high in salt.   Canned or highly processed food.   Food that is high in saturated fat or sugar.   Fried food.   Make any other lifestyle changes recommended by your health care provider. This may include:   Not using any tobacco products including cigarettes, chewing tobacco, or electronic cigarettes.  Managing your weight.   Getting regular exercise.   Managing your blood pressure.   Limiting your alcohol intake.   Managing other health problems, such as diabetes.   If you need an MRI after your heart stent was placed, be sure to tell the health care provider who orders the MRI that you have a heart stent.   Keep all follow-up visits as directed by your health care provider.  SEEK IMMEDIATE MEDICAL CARE IF:   You develop chest pain, shortness of breath, feel faint, or pass out.  You have bleeding, swelling larger than a walnut, or drainage from the catheter insertion site.  You develop pain, discoloration, coldness, or severe bruising in the leg or arm that held the catheter.  You develop bleeding from any other place such as from the bowels. There may be bright red blood in the urine or  stools, or it may appear as black, tarry stools.  You have a fever or chills. MAKE SURE YOU:  Understand these instructions.  Will watch your condition.  Will get help right away if you are not doing well or get worse. Document Released: 05/29/2005 Document Revised: 03/26/2014 Document Reviewed: 04/12/2013 [facility] Patient Information 2015 [place], [place]. This information is not intended to replace advice given to you by your health care provider. Make sure you discuss any questions you have with your health care  provider.

## 2015-03-08 NOTE — Telephone Encounter (Signed)
New message        TOC appt is Tues 4/26 at 2pm with Cecille Rubin

## 2015-03-08 NOTE — Progress Notes (Signed)
[id number] Cardiac Rehab Completed discharge education with pt. She voices understanding.Discussed EF with pt. Deon Pilling, RN 03/08/2015 11:22 AM

## 2015-03-08 NOTE — Progress Notes (Addendum)
Subjective: No chest pain  Objective: Vital signs in last 24 hours: Temp:  [97.8 F (36.6 C)-98.6 F (37 C)] 98.6 F (37 C) (04/15 0401) Pulse Rate:  [64-74] 73 (04/15 0401) Resp:  [14-18] 18 (04/15 0401) BP: (109-120)/(38-57) 120/57 mmHg (04/15 0401) SpO2:  [93 %-100 %] 95 % (04/15 0401) Weight:  [164 lb 7.4 oz (74.6 kg)] 164 lb 7.4 oz (74.6 kg) (04/15 0401) Last BM Date: 03/04/15  Intake/Output from previous day: 04/14 0701 - 04/15 0700 In: 840 [P.O.:840] Out: -  Intake/Output this shift: Total I/O In: 240 [P.O.:240] Out: 100 [Urine:100]  Medications Current Facility-Administered Medications  Medication Dose Route Frequency Provider Last Rate Last Dose  . acetaminophen (TYLENOL) tablet 650 mg  650 mg Oral Q4H PRN Lorretta Harp, MD      . amLODipine (NORVASC) tablet 5 mg  5 mg Oral Daily Dayna N Dunn, PA-C   5 mg at 03/08/15 1023  . aspirin chewable tablet 81 mg  81 mg Oral Daily Lorretta Harp, MD   81 mg at 03/08/15 1032  . carvedilol (COREG) tablet 6.25 mg  6.25 mg Oral BID WC Dayna N Dunn, PA-C   6.25 mg at 03/08/15 0907  . HYDROcodone-acetaminophen (NORCO/VICODIN) 5-325 MG per tablet 1-2 tablet  1-2 tablet Oral Q4H PRN Lorretta Harp, MD   2 tablet at 03/07/15 0431  . levothyroxine (SYNTHROID, LEVOTHROID) tablet 50 mcg  50 mcg Oral QAC breakfast Lorretta Harp, MD   50 mcg at 03/08/15 0907  . lisinopril (PRINIVIL,ZESTRIL) tablet 5 mg  5 mg Oral Daily Dayna N Dunn, PA-C   5 mg at 03/08/15 1028  . methocarbamol (ROBAXIN) tablet 500 mg  500 mg Oral Q8H PRN Lorretta Harp, MD      . morphine 2 MG/ML injection 2 mg  2 mg Intravenous Q1H PRN Lorretta Harp, MD      . nitroGLYCERIN (NITROSTAT) SL tablet 0.4 mg  0.4 mg Sublingual Q5 Min x 3 PRN Dayna N Dunn, PA-C      . ondansetron (ZOFRAN) injection 4 mg  4 mg Intravenous Q6H PRN Lorretta Harp, MD      . pantoprazole (PROTONIX) EC tablet 40 mg  40 mg Oral Daily Lorretta Harp, MD   40 mg at 03/08/15 1029    . rosuvastatin (CRESTOR) tablet 20 mg  20 mg Oral Daily Lorretta Harp, MD   20 mg at 03/08/15 1028  . ticagrelor (BRILINTA) tablet 90 mg  90 mg Oral BID Lorretta Harp, MD   90 mg at 03/08/15 1028    PE: General appearance: alert, cooperative, no distress and moderately obese Lungs: clear to auscultation bilaterally Heart: regular rate and rhythm Abdomen: obese Skin: Skin color, texture, turgor normal. No rashes or lesions Neurologic: Grossly normal  Lab Results:   Recent Labs  03/06/15 0415 03/07/15 0415 03/08/15 0355  WBC 8.0 5.7 6.3  HGB 12.5 12.7 12.4  HCT 39.0 40.5 39.5  PLT 166 176 158   BMET  Recent Labs  03/06/15 0415 03/07/15 0415 03/08/15 0355  NA 140 142 140  K 3.4* 4.6 4.0  CL 107 107 107  CO2 25 27 21   GLUCOSE 140* 111* 108*  BUN 19 14 22   CREATININE 1.10 1.29* 1.34*  CALCIUM 9.3 9.9 9.8   PT/INR  Recent Labs  03/06/15 0415 03/07/15 0415  LABPROT 15.9* 14.5  INR 1.25 1.11   Cholesterol  Recent Labs  03/07/15 0415  CHOL 166   Lipid Panel     Component Value Date/Time   CHOL 166 03/07/2015 0415   TRIG 188* 03/07/2015 0415   HDL 31* 03/07/2015 0415   CHOLHDL 5.4 03/07/2015 0415   VLDL 38 03/07/2015 0415   LDLCALC 97 03/07/2015 0415    Assessment/Plan 74 year old female followed by Dr Marlou Porch with a past history of coronary stenting, hypertension and hyperlipidemia. She was hospitalized in March 2016 with hypoxia of unclear etiology a week or so after Rt TKR.. CT was negative for PE.  2-D echo was normal. A Myoview stress test showed mild inferobasal ischemia  But low risk. She was deemed stable to be discharged home and treated medically. She's had off-and-on chest pain for several days preceding her admission 03/06/15. She had chest pain at 2 AM 03/06/15 which awakened her from sleep and she was brought to [facility] emergency room where her EKG showed inferolateral ST segment elevation. She was transferred to [facility] and taken  to the cath lab by Dr Gwenlyn Found. She had a 60% "in-stent restenosis" within the proximal LAD stent with a fairly focal 70% stenosis of the distal edge on a bend. The circumflex was the infarct-related artery and had a total occlusion in the distal AV groove. There was a moderate sized ramus branch that 70% "in-stent restenosis within the proximal stent. The dominant RCA had a 60% stenosis in the mid to distal portion. The distal stent had moderate in-stent restenosis in the 50% range with an 80% stenosis at the "crux just before the takeoff of the PDA. Her EF at cath was 35-40% with severe inferior and lateral hypokinesia. Distal abdominal aortogram-small distal dominant aneurysm, 95% calcified ostial bilateral common iliac artery stenoses with moderate post stenotic dilatation in the right common iliac artery. There was a 50 mm transstenotic gradient. The patient underwent successful circumflex PCI and stenting in this setting of an acute inferolateral myocardial infarction with moderate LV dysfunction and diffuse "in-stent restenosis within the distal RCA, proximal ramus and LAD stent. The remainder of her coronary anatomy will be treated medically at this time. She'll need lifelong antibiotic therapy. We will continue Angiomax for 4 hours before this. The patient will ultimately require intervention on her iliac arteries. I can see her back in the office for her peripheral vascular evaluation when she has recovered from her acute coronary event. Echocardiogram done 03/07/15 showed her EF to be 60%.   Principal Problem:   STEMI- urgent CFX PCI 03/06/15 Active Problems:   CAD S/P prior PCI to LAD and RCA   Essential hypertension   Aortoiliac occlusive disease   Acute renal insufficiency post PCI- SCr1.34   Mixed hyperlipidemia   ICM 35% at cath but EF 60% by echo 03/07/15   Hypokalemia   Hyperglycemia  Plan: Discharge today, f/u with Dr Gwenlyn Found for [facility] and Dr Marlou Porch long term. She'll need TOC follow up in 7-10  days. Will hold ACE till SCr re checked.    LOS: 2 days    KILROY,LUKE K PA-C 03/08/2015 10:39 AM  Agree with above assessment.  Patient doing well today.  No chest discomfort.  Lungs are clear.  The heart reveals no gallop.  Follow-up plans as noted above.  Recheck renal function at transition of care follow-up office visit.

## 2015-03-11 NOTE — Telephone Encounter (Signed)
Patient contacted regarding discharge from [facility] on 03/08/15.  Patient understands to follow up with provider Kathrene Alu on 03/19/15 at 2:00 PM at [facility]. Patient understands discharge instructions? yes Patient understands medications and regiment? yes Patient understands to bring all medications to this visit? yes  Pt states her (R) groin cath site does not have any swelling or redness. Medications reviewed.  States she did start taking the  Brilinta.  States she has one episode of some chest discomfort but only lasted a few seconds.  She did not take a NTG. Advised if she does not CP again and is not relieved by NTG needs to [facility] ER.  She verbalizes understanding. In D/C summary states will need a BMET on next office visit.  Will place order and advised pt to come 15 min early to get lab. She verbalizes understanding.

## 2015-03-12 NOTE — Telephone Encounter (Signed)
Closed encounter °

## 2015-03-13 LAB — HEMOGLOBIN A1C
Hgb A1c MFr Bld: 5.9 % — ABNORMAL HIGH (ref 4.8–5.6)
MEAN PLASMA GLUCOSE: 123 mg/dL

## 2015-03-18 NOTE — Progress Notes (Deleted)
CARDIOLOGY OFFICE NOTE  Date:  03/18/2015    Jessica Taylor Date of Birth: 1941/05/18 Medical Record [id number]  PCP:  [name], MD  Cardiologist:  [place] ([facility])  No chief complaint on file.    History of Present Illness: Jessica Taylor is a 74 y.o. female who presents today for a post hospital visit. Seen for Dr. Marlou Porch and Dr. Gwenlyn Found ([facility]). She has a past history of coronary stenting, hypertension and hyperlipidemia.   She was hospitalized in March 2016 with hypoxia of unclear etiology a week or so after Rt TKR.. CT was negative for PE. 2-D echo was normal. A Myoview stress test showed mild inferobasal ischemiabut low risk. She was deemed stable to be discharged home and treated medically. She had off-and-on chest pain for several days preceding her admission 03/06/15. She had chest pain at 2 AM 03/06/15 which awakened her from sleep and she was brought to [facility] emergency room where her EKG showed inferolateral ST segment elevation. She was transferred to [facility] and taken to the cath lab by Dr Gwenlyn Found. She had a 60% "in-stent restenosis" within the proximal LAD stent with a fairly focal 70% stenosis of the distal edge on a bend. The circumflex was the infarct-related artery and had a total occlusion in the distal AV groove. There was a moderate sized ramus branch that 70% "in-stent restenosis within the proximal stent. The dominant RCA had a 60% stenosis in the mid to distal portion. The distal stent had moderate in-stent restenosis in the 50% range with an 80% stenosis at the "crux just before the takeoff of the PDA. Her EF at cath was 35-40% with severe inferior and lateral hypokinesia. Distal abdominal aortogram-small distal dominant aneurysm, 95% calcified ostial bilateral common iliac artery stenoses with moderate post stenotic dilatation in the right common iliac artery. There was a 50 mm transstenotic gradient. The patient underwent successful circumflex PCI and  stenting in this setting of an acute inferolateral myocardial infarction with moderate LV dysfunction and diffuse "in-stent restenosis within the distal RCA, proximal ramus and LAD stent. The remainder of her coronary anatomy is be treated medically at this time. The patient will ultimately require intervention on her iliac arteries.  Echocardiogram done 03/07/15 showed her EF to be 60%. She did have a slight bump in her SCr to 1.34 after her PCI. ACE was held.  Comes in today. Here with    Past Medical History  Diagnosis Date  . Acid reflux   . Hypothyroidism   . 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 Hypercholesteremia   . Vertigo     when lays on left side.  . Cancer     basal cell on nose  . Anemia   . Coronary artery disease   . Hypertension   . Arthritis     knees , R shoulder - tx /w injection - 11/2014  . Tobacco abuse     Past Surgical History  Procedure Laterality Date  . Coronary stent placement    . Cardiac catheterization  5 stents  . Tubal ligation    . Cataract extraction w/phaco Left 05/24/2014    Procedure: CATARACT EXTRACTION PHACO AND INTRAOCULAR LENS PLACEMENT (IOC);  Surgeon: Tonny Branch, MD;  Location: [facility];  Service: Ophthalmology;  Laterality: Left;  CDE:  9.30  . Cataract extraction w/phaco Right 06/18/2014    Procedure: CATARACT EXTRACTION PHACO AND INTRAOCULAR LENS PLACEMENT RIGHT EYE CDE=10.84;  Surgeon: Tonny Branch, MD;  Location:  [facility];  Service: Ophthalmology;  Laterality: Right;  . Eye surgery    . Tonsillectomy      age 80  . Partial knee arthroplasty Right 02/04/2015    Procedure: UNICOMPARTMENTAL KNEE;  Surgeon: Dorna Leitz, MD;  Location: [place];  Service: Orthopedics;  Laterality: Right;  . Left heart catheterization with coronary angiogram N/A 03/06/2015    Procedure: LEFT HEART CATHETERIZATION WITH CORONARY ANGIOGRAM;  Surgeon: Lorretta Harp, MD;  Location: [facility] CATH LAB;  Service: Cardiovascular;  Laterality: N/A;  . Coronary stent placement  03/06/15    CFX DES      Medications: Current Outpatient Prescriptions  Medication Sig Dispense Refill  . acetaminophen (TYLENOL) 325 MG tablet Take 2 tablets (650 mg total) by mouth every 4 (four) hours as needed for headache or mild pain.    Marland Kitchen amLODipine (NORVASC) 10 MG tablet Take 10 mg by mouth daily before breakfast.     . aspirin EC 81 MG tablet Take 81 mg by mouth at bedtime.    . Fish Oil-Cholecalciferol (FISH OIL + D3 PO) Take 1 capsule by mouth daily.    Marland Kitchen HYDROcodone-acetaminophen (NORCO/VICODIN) 5-325 MG per tablet Take 1-2 tablets by mouth every 4 (four) hours as needed for severe pain. 60 tablet 0  . Ketotifen Fumarate (THERA TEARS ALLERGY OP) Apply 1 drop to eye 2 (two) times daily as needed (dry eyes).    Marland Kitchen levothyroxine (SYNTHROID, LEVOTHROID) 50 MCG tablet Take 50 mcg by mouth daily before breakfast.    . methocarbamol (ROBAXIN) 500 MG tablet Take 1 tablet (500 mg total) by mouth every 8 (eight) hours as needed for muscle spasms. 50 tablet 0  . metoprolol tartrate (LOPRESSOR) 25 MG tablet Take 25 mg by mouth daily.    Marland Kitchen NEXIUM 40 MG capsule TAKE ONE CAPSULE BY MOUTH ONCE DAILY AT NOON 30 capsule 0  . nitroGLYCERIN (NITROSTAT) 0.4 MG SL tablet Place 1 tablet (0.4 mg total) under the tongue every 5 (five) minutes x 3 doses as needed for chest pain. 25 tablet 3  . rosuvastatin (CRESTOR) 20 MG tablet Take 1 tablet (20 mg total) by mouth daily. 30 tablet 5  . ticagrelor (BRILINTA) 90 MG TABS tablet Take 1 tablet (90 mg total) by mouth 2 (two) times daily. 60 tablet 11   No current facility-administered medications for this visit.    Allergies: Allergies  Allergen Reactions  . Shrimp [Shellfish Allergy] Nausea And Vomiting    Throws up violently    Social History: The patient  reports that she quit smoking about 15 years ago. Her smoking use included Cigarettes. She has a 63 pack-year smoking history. She has never used smokeless tobacco. She reports that she does not drink alcohol or use  illicit drugs.   Family History: The patient's ***family history includes Cancer (age of onset: 57) in her father; Cancer (age of onset: 84) in her mother.   Review of Systems: Please see the history of present illness.   Otherwise, the review of systems is positive for {NONE DEFAULTED:18576}.   All other systems are reviewed and negative.   Physical Exam: VS:  There were no vitals taken for this visit. Marland Kitchen  BMI There is no weight on file to calculate BMI.  Wt Readings from Last 3 Encounters:  03/08/15 164 lb 7.4 oz (74.6 kg)  02/12/15 173 lb 15.1 oz (78.9 kg)  02/04/15 172 lb (78.019 kg)    General: Pleasant. Well developed, well nourished and in no acute  distress.  HEENT: Normal. Neck: Supple, no JVD, carotid bruits, or masses noted.  Cardiac: ***Regular rate and rhythm. No murmurs, rubs, or gallops. No edema.  Respiratory:  Lungs are clear to auscultation bilaterally with normal work of breathing.  GI: Soft and nontender.  MS: No deformity or atrophy. Gait and ROM intact. Skin: Warm and dry. Color is normal.  Neuro:  Strength and sensation are intact and no gross focal deficits noted.  Psych: Alert, appropriate and with normal affect.   LABORATORY DATA:  EKG:  EKG {ACTION; IS/IS [id number] ordered today. This demonstrates ***.  Lab Results  Component Value Date   WBC 6.3 03/08/2015   HGB 12.4 03/08/2015   HCT 39.5 03/08/2015   PLT 158 03/08/2015   GLUCOSE 108* 03/08/2015   CHOL 166 03/07/2015   TRIG 188* 03/07/2015   HDL 31* 03/07/2015   LDLCALC 97 03/07/2015   ALT 19 03/06/2015   AST 20 03/06/2015   NA 140 03/08/2015   K 4.0 03/08/2015   CL 107 03/08/2015   CREATININE 1.34* 03/08/2015   BUN 22 03/08/2015   CO2 21 03/08/2015   TSH 0.844 03/06/2015   INR 1.11 03/07/2015   HGBA1C 5.9* 03/06/2015    BNP (last 3 results)  Recent Labs  02/08/15 2337  BNP 67.3    ProBNP (last 3 results) No results for input(s): PROBNP in the last 8760 hours.   Other  Studies Reviewed Today:   Assessment/Plan: 1. STEMI - with urgent PCI to LCX  2. CAD with prior PCI to LAD & RCA  3. PVD - will need to get back to Dr. Gwenlyn Found for evaluation/treatment  4. HTN  5. ICM - EF 35% at cath but 60% by echo  6. Acute renal insufficiency  Current medicines are reviewed with the patient today.  The patient does not have concerns regarding medicines other than what has been noted above.  The following changes have been made:  See above.  Labs/ tests ordered today include:   No orders of the defined types were placed in this encounter.     Disposition:   FU with *** in {gen number [id number] {Days to years:10300}.   Patient is agreeable to this plan and will call if any problems develop in the interim.   Signed: Burtis Junes, RN, ANP-C 03/18/2015 9:53 PM  [place] [address] [place], [place]  [address] Phone: [phone] Fax: [phone]

## 2015-03-19 ENCOUNTER — Telehealth: Payer: Self-pay | Admitting: Cardiology

## 2015-03-19 ENCOUNTER — Ambulatory Visit (INDEPENDENT_AMBULATORY_CARE_PROVIDER_SITE_OTHER): Payer: Medicare Other | Admitting: Nurse Practitioner

## 2015-03-19 ENCOUNTER — Other Ambulatory Visit (INDEPENDENT_AMBULATORY_CARE_PROVIDER_SITE_OTHER): Payer: Medicare Other | Admitting: *Deleted

## 2015-03-19 ENCOUNTER — Encounter: Payer: Self-pay | Admitting: Nurse Practitioner

## 2015-03-19 VITALS — BP 128/72 | HR 83 | Ht 64.0 in | Wt 162.4 lb

## 2015-03-19 DIAGNOSIS — E782 Mixed hyperlipidemia: Secondary | ICD-10-CM | POA: Diagnosis not present

## 2015-03-19 DIAGNOSIS — R079 Chest pain, unspecified: Secondary | ICD-10-CM | POA: Diagnosis not present

## 2015-03-19 DIAGNOSIS — I10 Essential (primary) hypertension: Secondary | ICD-10-CM | POA: Diagnosis not present

## 2015-03-19 LAB — BASIC METABOLIC PANEL
BUN: 15 mg/dL (ref 6–23)
CALCIUM: 10 mg/dL (ref 8.4–10.5)
CO2: 29 meq/L (ref 19–32)
Chloride: 104 mEq/L (ref 96–112)
Creatinine, Ser: 1.07 mg/dL (ref 0.40–1.20)
GFR: 52.98 mL/min — ABNORMAL LOW (ref >60.00)
GLUCOSE: 105 mg/dL — AB (ref 70–99)
POTASSIUM: 3.6 meq/L (ref 3.5–5.1)
SODIUM: 139 meq/L (ref 135–145)

## 2015-03-19 MED ORDER — NITROGLYCERIN 0.4 MG SL SUBL: 0.4000 mg | SUBLINGUAL_TABLET | SUBLINGUAL | Status: DC | PRN

## 2015-03-19 MED ORDER — METOPROLOL TARTRATE 25 MG PO TABS: 25.0000 mg | ORAL_TABLET | Freq: Two times a day (BID) | ORAL | Status: DC

## 2015-03-19 NOTE — Patient Instructions (Addendum)
  Medication Instructions:    Continue with your current medicines but  I am increasing the Lopressor to twice a day - this will help keep your heart rate from going fast  I have refilled your NTG today    Testing/Procedures To Be Arranged:  N/A  Follow-Up:   See DrMarlou Porch in 6 weeks  See Dr. Gwenlyn Found as planned     Other Special Instructions:   Will refer you to cardiac rehab at [facility]  Call the [place] office at [phone] if you have any questions, problems or concerns.

## 2015-03-19 NOTE — Progress Notes (Signed)
CARDIOLOGY OFFICE NOTE  Date:  03/19/2015    Jessica Taylor Date of Birth: [birth year] Medical Record #[id number]  PCP:  [name], MD  Cardiologist:  Ruben Im for [facility]  Chief Complaint  Patient presents with  . Coronary Artery Disease    Post hospital visit - seen for Dr. Marlou Porch     History of Present Illness: Jessica Taylor is a 74 y.o. female who presents today for a post hospital visit. She is followed by Dr Marlou Porch. She has a past history of coronary stenting, hypertension and hyperlipidemia.   She was hospitalized in March 2016 with hypoxia of unclear etiology a week or so after Rt TKR.. CT was negative for PE. 2-D echo was normal. A Myoview stress test showed mild inferobasal ischemia But low risk. She was deemed stable to be discharged home and treated medically. She's had off-and-on chest pain for several days preceding her admission 03/06/15. She had chest pain at 2 AM 03/06/15 which awakened her from sleep and she was brought to [facility] emergency room where her EKG showed inferolateral ST segment elevation. She was transferred to [facility] and taken to the cath lab by Dr Gwenlyn Found. She had a 60% "in-stent restenosis" within the proximal LAD stent with a fairly focal 70% stenosis of the distal edge on a bend. The circumflex was the infarct-related artery and had a total occlusion in the distal AV groove. There was a moderate sized ramus branch that 70% "in-stent restenosis within the proximal stent. The dominant RCA had a 60% stenosis in the mid to distal portion. The distal stent had moderate in-stent restenosis in the 50% range with an 80% stenosis at the "crux just before the takeoff of the PDA. Her EF at cath was 35-40% with severe inferior and lateral hypokinesia. Distal abdominal aortogram-small distal dominant aneurysm, 95% calcified ostial bilateral common iliac artery stenoses with moderate post stenotic dilatation in the right common iliac artery. There was a  50 mm transstenotic gradient. The patient underwent successful circumflex PCI and stenting in this setting of an acute inferolateral myocardial infarction with moderate LV dysfunction and diffuse "in-stent restenosis within the distal RCA, proximal ramus and LAD stent. The remainder of her coronary anatomy will be treated medically at this time.  The patient will ultimately require intervention on her iliac arteries. Echocardiogram done 03/07/15 showed her EF to be 60%. She did have a slight bump in her SCr to 1.34 after her PCI. ACE was held at discharge.   Comes back today. Here with her daughter. She feels like she is doing ok. Mostly limited by her knee replacement -did not get to do her therapy. Some fleeting chest pain - notes this if her HR is high - like in the 80's. Otherwise she has had no chest pain. Not dizzy or lightheaded. Not short of breath. Tolerating her medicine. Has had lab drawn prior to my seeing her.   Past Medical History  Diagnosis Date  . Acid reflux   . Hypothyroidism   . MI, old 89  . Hypercholesteremia   . Vertigo     when lays on left side.  . Cancer     basal cell on nose  . Anemia   . Coronary artery disease   . Hypertension   . Arthritis     knees , R shoulder - tx /w injection - 11/2014  . Tobacco abuse     Past Surgical History  Procedure Laterality  Date  . Coronary stent placement    . Cardiac catheterization  5 stents  . Tubal ligation    . Cataract extraction w/phaco Left 05/24/2014    Procedure: CATARACT EXTRACTION PHACO AND INTRAOCULAR LENS PLACEMENT (IOC);  Surgeon: Tonny Branch, MD;  Location: [facility];  Service: Ophthalmology;  Laterality: Left;  CDE:  9.30  . Cataract extraction w/phaco Right 06/18/2014    Procedure: CATARACT EXTRACTION PHACO AND INTRAOCULAR LENS PLACEMENT RIGHT EYE CDE=10.84;  Surgeon: Tonny Branch, MD;  Location: [facility];  Service: Ophthalmology;  Laterality: Right;  . Eye surgery    . Tonsillectomy      age 69  . Partial knee  arthroplasty Right 02/04/2015    Procedure: UNICOMPARTMENTAL KNEE;  Surgeon: Dorna Leitz, MD;  Location: [place];  Service: Orthopedics;  Laterality: Right;  . Left heart catheterization with coronary angiogram N/A 03/06/2015    Procedure: LEFT HEART CATHETERIZATION WITH CORONARY ANGIOGRAM;  Surgeon: Lorretta Harp, MD;  Location: [facility] CATH LAB;  Service: Cardiovascular;  Laterality: N/A;  . Coronary stent placement  03/06/15    CFX DES     Medications: Current Outpatient Prescriptions  Medication Sig Dispense Refill  . acetaminophen (TYLENOL) 325 MG tablet Take 2 tablets (650 mg total) by mouth every 4 (four) hours as needed for headache or mild pain.    Marland Kitchen amLODipine (NORVASC) 10 MG tablet Take 10 mg by mouth daily before breakfast.     . aspirin EC 81 MG tablet Take 81 mg by mouth at bedtime.    . Fish Oil-Cholecalciferol (FISH OIL + D3 PO) Take 1 capsule by mouth daily.    Marland Kitchen HYDROcodone-acetaminophen (NORCO/VICODIN) 5-325 MG per tablet Take 1-2 tablets by mouth every 4 (four) hours as needed for severe pain. 60 tablet 0  . Ketotifen Fumarate (THERA TEARS ALLERGY OP) Apply 1 drop to eye 2 (two) times daily as needed (dry eyes).    Marland Kitchen levothyroxine (SYNTHROID, LEVOTHROID) 50 MCG tablet Take 50 mcg by mouth daily before breakfast.    . methocarbamol (ROBAXIN) 500 MG tablet Take 1 tablet (500 mg total) by mouth every 8 (eight) hours as needed for muscle spasms. 50 tablet 0  . metoprolol tartrate (LOPRESSOR) 25 MG tablet Take 25 mg by mouth daily.    Marland Kitchen NEXIUM 40 MG capsule TAKE ONE CAPSULE BY MOUTH ONCE DAILY AT NOON 30 capsule 0  . nitroGLYCERIN (NITROSTAT) 0.4 MG SL tablet Place 1 tablet (0.4 mg total) under the tongue every 5 (five) minutes x 3 doses as needed for chest pain. 25 tablet 3  . rosuvastatin (CRESTOR) 20 MG tablet Take 1 tablet (20 mg total) by mouth daily. 30 tablet 5  . ticagrelor (BRILINTA) 90 MG TABS tablet Take 1 tablet (90 mg total) by mouth 2 (two) times daily. 60 tablet 11    No current facility-administered medications for this visit.    Allergies: Allergies  Allergen Reactions  . Shrimp [Shellfish Allergy] Nausea And Vomiting    Throws up violently    Social History: The patient  reports that she quit smoking about 15 years ago. Her smoking use included Cigarettes. She has a 63 pack-year smoking history. She has never used smokeless tobacco. She reports that she does not drink alcohol or use illicit drugs.   Family History: The patient's family history includes Cancer (age of onset: 68) in her father; Cancer (age of onset: 53) in her mother.   Review of Systems: Please see the history of present illness.  All other systems are reviewed and negative.   Physical Exam: VS:  BP 128/72 mmHg  Pulse 83  Ht _0  (1.626 m)  Wt 162 lb 6.4 oz (73.664 kg)  BMI 27.86 kg/m2  SpO2 97% .  BMI Body mass index is 27.86 kg/(m^2).  Wt Readings from Last 3 Encounters:  03/19/15 162 lb 6.4 oz (73.664 kg)  03/08/15 164 lb 7.4 oz (74.6 kg)  02/12/15 173 lb 15.1 oz (78.9 kg)    General: Pleasant. Well developed, well nourished and in no acute distress.  HEENT: Normal. Neck: Supple, no JVD, carotid bruits, or masses noted.  Cardiac: Regular rate and rhythm. No murmurs, rubs, or gallops. No edema.  Respiratory:  Lungs are clear to auscultation bilaterally with normal work of breathing.  GI: Soft and nontender.  MS: No deformity or atrophy. Gait and ROM intact. Skin: Warm and dry. Color is normal.  Neuro:  Strength and sensation are intact and no gross focal deficits noted.  Psych: Alert, appropriate and with normal affect.   LABORATORY DATA:  EKG:  EKG is ordered today. This demonstrates NSR with nonspecific T wave changes.  Lab Results  Component Value Date   WBC 6.3 03/08/2015   HGB 12.4 03/08/2015   HCT 39.5 03/08/2015   PLT 158 03/08/2015   GLUCOSE 108* 03/08/2015   CHOL 166 03/07/2015   TRIG 188* 03/07/2015   HDL 31* 03/07/2015   LDLCALC 97  03/07/2015   ALT 19 03/06/2015   AST 20 03/06/2015   NA 140 03/08/2015   K 4.0 03/08/2015   CL 107 03/08/2015   CREATININE 1.34* 03/08/2015   BUN 22 03/08/2015   CO2 21 03/08/2015   TSH 0.844 03/06/2015   INR 1.11 03/07/2015   HGBA1C 5.9* 03/06/2015    BNP (last 3 results)  Recent Labs  02/08/15 2337  BNP 67.3    ProBNP (last 3 results) No results for input(s): PROBNP in the last 8760 hours.   Other Studies Reviewed Today:  Echo Study Conclusions 02/2015  - Left ventricle: The cavity size was normal. Wall thickness was increased in a pattern of mild LVH. Systolic function was normal. The estimated ejection fraction was in the range of 60% to 65%. Doppler parameters are consistent with abnormal left ventricular relaxation (grade 1 diastolic dysfunction).   CARDIAC CATHETERIZATION   ANGIOGRAPHIC RESULTS:   1. Left main; minor irregularities  2. LAD; there was 60% "in-stent restenosis" within the proximal LAD stent with a fairly focal 70% stenosis of the distal edge on a bend. 3. Left circumflex; the circumflex was the infarct-related artery and had total occlusion in the distal AV groove. There was a moderate sized ramus branch that 70% "in-stent restenosis within the proximal stent..  4. Right coronary artery; dominant with 60% stenosis in the mid to distal portion. The distal stent had moderate in-stent restenosis in the 50% range with an 80% stenosis at the "crux just before the takeoff of the PDA. 5. Left ventriculography; RAO left ventriculogram was performed using  25 mL of Visipaque dye at 12 mL/second. The overall LVEF estimated  35-40% with severe inferior and lateral hypokinesia % With/Without wall motion abnormalities 6. Distal abdominal aortogram-small distal dominant aneurysm, 95% calcified ostial bilateral common iliac artery stenoses with moderate post stenotic dilatation in the right common iliac artery. There was a 50 mm transstenotic  gradient.  IMPRESSION:Mrs. Samons has an occluded circumflex in the midportion with in-stent restenosis within the RCA, LAD and ramus branch stents.  We will proceed with PCI and stenting using Angiomax, Brilenta and drug eluding stent.  Procedure description: The patient received Angiomax bolus with an ACT of 429. Total contrast Mr. To the patient was 190 mL I was unable to pass a 6 [place] XB 3.0 cm guide catheter across the right common iliac origin and therefore changed sheaths, 6 French, 35 cm long sheath which I was able to pass over an 035 wire into the distal abdominal aorta. I crossed the lesion with an 014/190 cm long water guidewire, predilated with a 2 mm x 12 mm balloon and stented the circumflex with a 2.5 mm x 15 mm long Xience drug-eluting stent deployed at 15 atm. I postdilated with a 2.75 mm x 12 mm long noncompliant stenting 18 atm (2.83 mm) resulting reduction with total occlusion to 0% residual with excellent flow and no dissection. The patient tolerated the procedure well. The guidewire and catheter were removed.  Overall impression: Successful circumflex PCI and stenting in this setting of an acute inferolateral myocardial infarction with moderate LV dysfunction and diffuse "in-stent restenosis within the distal RCA, proximal ramus and LAD stent. The remainder of her coronary anatomy will be treated medically at this time. She'll need lifelong antibiotic therapy. We will continue Angiomax for 4 hours before this. The patient will ultimately require intervention on her iliac arteries. I can see her back in the office for her peripheral vascular evaluation when she has recovered from her acute coronary event.  Lorretta Harp MD, [facility] 03/06/2015 6:46 AM   Distal abdominal aortogram-small distal dominant aneurysm, 95% calcified ostial bilateral common iliac artery stenoses with moderate post stenotic dilatation in the right common iliac artery.    Assessment/Plan: 1. STEMI  inferolateral with urgent PCI of the LCX  with moderate LV dysfunction and diffuse "in-stent restenosis within the distal RCA, proximal ramus and LAD stent. The remainder of her coronary anatomy was be treated medically at this time.  Committed to lifelong DAPT - She is doing ok clinically. Has had some fleeting chest pain associated with elevated HR - I have increased her Lopressor today to BID. See back in 6 weeks. Refer to cardiac rehab at [facility]  2. Chest pain - related to elevated HR - beta blocker increased.   3. CAD with prior PCI to LAD and RCA  4. PVD - has follow up with Dr. Gwenlyn Found  5. HLD  6. ICM 35% at cath - but EF 60% by echo 02/2015 - volume status looks ok - may need repeat echo in next few months. She appears compensated  7. Recent TKR - her ortho rehab was never able to be done due to her heart attack - will try to send her to cardiac rehab - she may need some modifications but I think she will benefit. I think she could try to do some regular PT as well. Will send a note to Dr. Berenice Primas. We will need to get her moving   Current medicines are reviewed with the patient today.  The patient does not have concerns regarding medicines other than what has been noted above.  The following changes have been made:  See above.  Labs/ tests ordered today include:    Orders Placed This Encounter  Procedures  . EKG 12-Lead     Disposition:   FU with Dr. Marlou Porch in 6 months.   Patient is agreeable to this plan and will call if any problems develop in the interim.   Signed: Cecille Rubin  Marin Olp, RN, ANP-C 03/19/2015 2:24 PM  [facility] [address] [place], [place]  [address] Phone: [phone] Fax: [phone]

## 2015-03-19 NOTE — Telephone Encounter (Signed)
Ovid Curd is calling in to see if the pt is needing refills on the pt's Brilinta prescription. Please call  Thanks

## 2015-03-19 NOTE — Telephone Encounter (Signed)
Clarification on refills for Brilinta was requested by pharmacy - referred caller to e-refill previously submitted.

## 2015-03-21 ENCOUNTER — Telehealth: Payer: Self-pay | Admitting: Cardiology

## 2015-03-21 NOTE — Telephone Encounter (Signed)
New problem   Pt returning your call concerning results.

## 2015-03-21 NOTE — Telephone Encounter (Signed)
Reviewed results of labs with patient.

## 2015-03-22 ENCOUNTER — Telehealth: Payer: Self-pay | Admitting: Cardiovascular Disease

## 2015-03-22 NOTE — Telephone Encounter (Signed)
Spoke with pt dtr, the family is concerned about the patient. Spoke with pt, she had an episode of indigestion last night after eating. She was watching TV. She states it is the same type of discomfort she had prior to the MI. She took some OTC medication and after a couple hours it went away. Today she is tired, which she states is an ongoing problem. She denies SOB, chest pain or other issues today. Patient voiced understanding to go to the ER for any prolonged pain. Aware of follow up appt with dr berry.

## 2015-03-22 NOTE — Telephone Encounter (Signed)
Pt;s daughter is calling in wanting to speak with a nurse about her mother's condition. She states that her level of activity today has been NONE and that is unlike her and she is concerned. Please call   Thanks

## 2015-03-25 ENCOUNTER — Telehealth: Payer: Self-pay | Admitting: Cardiology

## 2015-03-25 NOTE — Telephone Encounter (Signed)
Cardiac rehab aware.  They will fax over a new order to be signed.

## 2015-03-25 NOTE — Telephone Encounter (Signed)
Pt had a recent MI and was to have started at Cardiac Rehab at [place].  She is unable to afford this at $50/each session (3 X a week).  Cardiac rehab would like to know if she can do the maintenance program 2 to 3 times a week which will cost her much less.   The maintenance program is not monitored.  They will only be taking her HR and BP at the begining of the session.  Advised I will ask Dr Marlou Porch and call her back.

## 2015-03-25 NOTE — Telephone Encounter (Signed)
I am fine with this plan Candee Furbish, MD

## 2015-03-25 NOTE — Telephone Encounter (Signed)
New message   Cardiac rehab    Referral came over. Patient is unable to pay co-pay under [facility] $ 50.00 each visit patient will be coming 3 x a weeks.    Please advise on monitor and can patient go under maintenance program.

## 2015-04-01 ENCOUNTER — Ambulatory Visit: Payer: Medicare Other | Admitting: Physician Assistant

## 2015-04-10 ENCOUNTER — Encounter: Payer: Self-pay | Admitting: Cardiovascular Disease

## 2015-04-10 ENCOUNTER — Ambulatory Visit (INDEPENDENT_AMBULATORY_CARE_PROVIDER_SITE_OTHER): Payer: Medicare Other | Admitting: Cardiovascular Disease

## 2015-04-10 VITALS — BP 142/54 | HR 72 | Ht 64.0 in | Wt 163.0 lb

## 2015-04-10 DIAGNOSIS — I739 Peripheral vascular disease, unspecified: Secondary | ICD-10-CM

## 2015-04-10 DIAGNOSIS — I714 Abdominal aortic aneurysm, without rupture, unspecified: Secondary | ICD-10-CM

## 2015-04-10 DIAGNOSIS — I7409 Other arterial embolism and thrombosis of abdominal aorta: Secondary | ICD-10-CM

## 2015-04-10 DIAGNOSIS — I519 Heart disease, unspecified: Secondary | ICD-10-CM

## 2015-04-10 DIAGNOSIS — D689 Coagulation defect, unspecified: Secondary | ICD-10-CM

## 2015-04-10 DIAGNOSIS — I700 Atherosclerosis of aorta: Secondary | ICD-10-CM

## 2015-04-10 DIAGNOSIS — Z79899 Other long term (current) drug therapy: Secondary | ICD-10-CM

## 2015-04-10 NOTE — Patient Instructions (Signed)
Dr Gwenlyn Found has requested that you have an echocardiogram. Echocardiography is a painless test that uses sound waves to create images of your heart. It provides your doctor with information about the size and shape of your heart and how well your heart's chambers and valves are working. This procedure takes approximately one hour. There are no restrictions for this procedure.  Your physician has requested that you have an abdominal aorta duplex. During this test, an ultrasound is used to evaluate the aorta. Allow 30 minutes for this exam. Do not eat after midnight the day before and avoid carbonated beverages  Your physician has requested that you have a lower extremity arterial duplex. This test is an ultrasound of the arteries in the legs. It looks at arterial blood flow in the legs. Allow one hour for Lower Arterial scans. There are no restrictions or special instructions.  Dr. Gwenlyn Found has ordered a peripheral angiogram to be done at [facility].  This procedure is going to look at the bloodflow in your lower extremities.  If Dr. Gwenlyn Found is able to open up the arteries, you will have to spend one night in the hospital.  If he is not able to open the arteries, you will be able to go home that same day.    After the procedure, you will not be allowed to drive for 3 days or push, pull, or lift anything greater than 10 lbs for one week.    You will be required to have the following tests prior to the procedure:  1. Blood work-the blood work can be done no more than 7 days prior to the procedure.  It can be done at any [facility] lab.  There is one downstairs on the first floor of this building and one in the [place] ([address])  2. Chest Xray-the chest xray order has already been placed at the [facility].     *REPS Todd and Eric  Bilateral groin access

## 2015-04-10 NOTE — Progress Notes (Signed)
04/10/2015 Jessica Taylor   Apr 06, 1941  [id number]  Primary Physician [name], MD Primary Cardiologist: Lorretta Harp MD Renae Gloss   HPI:  Jessica Taylor is a 74 year old married Caucasian  female mother of 76, grandmother and 6 grandchildren with a past history of coronary stenting performed by Dr. Quay Burow., hypertension and hyperlipidemia. She was hospitalized several months ago with hypoxia of unclear etiology. She did have a total knee replacement prior to that admission. CT abdomen was negative for PE. A 2-D echo was normal. A Myoview stress test showed mild inferobasal ischemia and she was deemed stable to be discharged home and treated medically. She's had off-and-on chest pain over the last several days prior to admission. She had chest pain the morning on admission  at 2 AM which awakened her from sleep and she was brought to [facility] emergency room where her EKG showed inferolateral ST segment elevation. She was transferred by EMS to [facility] for urgent intervention. I performed cardiac catheterization on her via the right femoral approach revealed an occluded mid circumflex artery which I stented. Her EF by cath was 35% by echo the following day 60%. 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 She does have lifestyle limiting claudication.   Current Outpatient Prescriptions  Medication Sig Dispense Refill  . acetaminophen (TYLENOL) 325 MG tablet Take 2 tablets (650 mg total) by mouth every 4 (four) hours as needed for headache or mild pain.    Marland Kitchen amLODipine (NORVASC) 10 MG tablet Take 10 mg by mouth daily before breakfast.     . aspirin EC 81 MG tablet Take 81 mg by mouth at bedtime.    . Fish Oil-Cholecalciferol (FISH OIL + D3 PO) Take 1 capsule by mouth daily.    Marland Kitchen HYDROcodone-acetaminophen (NORCO/VICODIN) 5-325  MG per tablet Take 1-2 tablets by mouth every 4 (four) hours as needed for severe pain. 60 tablet 0  . Ketotifen Fumarate (THERA TEARS ALLERGY OP) Apply 1 drop to eye 2 (two) times daily as needed (dry eyes).    Marland Kitchen levothyroxine (SYNTHROID, LEVOTHROID) 50 MCG tablet Take 50 mcg by mouth daily before breakfast.    . methocarbamol (ROBAXIN) 500 MG tablet Take 1 tablet (500 mg total) by mouth every 8 (eight) hours as needed for muscle spasms. 50 tablet 0  . metoprolol tartrate (LOPRESSOR) 25 MG tablet Take 1 tablet (25 mg total) by mouth 2 (two) times daily. 60 tablet 6  . NEXIUM 40 MG capsule TAKE ONE CAPSULE BY MOUTH ONCE DAILY AT NOON 30 capsule 0  . nitroGLYCERIN (NITROSTAT) 0.4 MG SL tablet Place 1 tablet (0.4 mg total) under the tongue every 5 (five) minutes x 3 doses as needed for chest pain. 25 tablet 3  . rosuvastatin (CRESTOR) 20 MG tablet Take 1 tablet (20 mg total) by mouth daily. 30 tablet 5  . ticagrelor (BRILINTA) 90 MG TABS tablet Take 1 tablet (90 mg total) by mouth 2 (two) times daily. 60 tablet 11   No current facility-administered medications for this visit.    Allergies  Allergen Reactions  . Shrimp [Shellfish Allergy] Nausea And Vomiting    Throws up violently    History   Social History  . Marital Status: Married    Spouse Name: N/A  . Number of Children: N/A  . Years of Education: N/A   Occupational History  .  Not on file.   Social History Main Topics  . Smoking status: Former Smoker -- 1.50 packs/day for 42 years    Types: Cigarettes    Quit date: 05/19/1999  . Smokeless tobacco: Never Used  . Alcohol Use: No  . Drug Use: No  . Sexual Activity: Yes    Birth Control/ Protection: Surgical   Other Topics Concern  . Not on file   Social History Narrative     Review of Systems: General: negative for chills, fever, night sweats or weight changes.  Cardiovascular: negative for chest pain, dyspnea on exertion, edema, orthopnea, palpitations, paroxysmal  nocturnal dyspnea or shortness of breath Dermatological: negative for rash Respiratory: negative for cough or wheezing Urologic: negative for hematuria Abdominal: negative for nausea, vomiting, diarrhea, bright red blood per rectum, melena, or hematemesis Neurologic: negative for visual changes, syncope, or dizziness All other systems reviewed and are otherwise negative except as noted above.    Blood pressure 142/54, pulse 72, height 5\' 4"  (1.626 m), weight 163 lb (73.936 kg).  General appearance: alert and no distress Neck: no adenopathy, no carotid bruit, no JVD, supple, symmetrical, trachea midline and thyroid not enlarged, symmetric, no tenderness/mass/nodules Lungs: clear to auscultation bilaterally Heart: regular rate and rhythm, S1, S2 normal, no murmur, click, rub or gallop Extremities: diminished femoral pulses bilaterally  EKG not performed today  ASSESSMENT AND PLAN:   Aortoiliac occlusive disease Jessica Taylor returns today for follow-up of her peripheral arterial disease. She had a STEMI on 03/06/15 secondary to occluded circumflex which I intervened on. The right femoral approach. She does have moderate residual CAD but is asymptomatic. 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 The patient does have lifestyle limiting claudication which he attributed to arthritis and sciatica in the past.I'm going to get an abdominal ultrasound to demonstrate the size of the aneurysm as well as lower extremity partial Doppler studies. If the aneurysm is small she will be candidate for diamondback atherectomy and stenting of her bilateral iliac artery ostia using ICast covered stents.       Lorretta Harp MD FACP,FACC,FAHA, [facility] 04/10/2015 11:03 AM

## 2015-04-10 NOTE — Assessment & Plan Note (Signed)
Jessica Taylor returns today for follow-up of her peripheral arterial disease. She had a STEMI on 03/06/15 secondary to occluded circumflex which I intervened on. The right femoral approach. She does have moderate residual CAD but is asymptomatic. 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 The patient does have lifestyle limiting claudication which he attributed to arthritis and sciatica in the past.I'm going to get an abdominal ultrasound to demonstrate the size of the aneurysm as well as lower extremity partial Doppler studies. If the aneurysm is small she will be candidate for diamondback atherectomy and stenting of her bilateral iliac artery ostia using ICast covered stents.

## 2015-04-19 ENCOUNTER — Other Ambulatory Visit: Payer: Self-pay | Admitting: Cardiology

## 2015-04-26 ENCOUNTER — Encounter: Payer: Self-pay | Admitting: Cardiology

## 2015-04-26 ENCOUNTER — Ambulatory Visit (INDEPENDENT_AMBULATORY_CARE_PROVIDER_SITE_OTHER): Payer: Medicare Other | Admitting: Cardiology

## 2015-04-26 VITALS — BP 118/78 | HR 77 | Ht 64.0 in | Wt 164.0 lb

## 2015-04-26 DIAGNOSIS — Z96651 Presence of right artificial knee joint: Secondary | ICD-10-CM

## 2015-04-26 DIAGNOSIS — I252 Old myocardial infarction: Secondary | ICD-10-CM | POA: Diagnosis not present

## 2015-04-26 DIAGNOSIS — I255 Ischemic cardiomyopathy: Secondary | ICD-10-CM

## 2015-04-26 DIAGNOSIS — I2121 ST elevation (STEMI) myocardial infarction involving left circumflex coronary artery: Secondary | ICD-10-CM

## 2015-04-26 NOTE — Progress Notes (Signed)
CARDIOLOGY OFFICE NOTE  Date:  04/26/2015    Jessica Taylor Date of Birth: [birth year] Medical Record #[id number]  PCP:  [name], MD  Cardiologist:  Ruben Im for [facility]  Chief Complaint  Patient presents with  . Coronary Artery Disease     History of Present Illness: Jessica Taylor is a 74 y.o. female who presents today for a post hospital visit. She is followed by Dr Marlou Porch. She has a past history of coronary stenting, hypertension and hyperlipidemia.   She was hospitalized in March 2016 with hypoxia of unclear etiology a week or so after Rt TKR.. CT was negative for PE. 2-D echo was normal. A Myoview stress test showed mild inferobasal ischemia But low risk. She was deemed stable to be discharged home and treated medically. She's had off-and-on chest pain for several days preceding her admission 03/06/15. She had chest pain at 2 AM 03/06/15 which awakened her from sleep and she was brought to [facility] emergency room where her EKG showed inferolateral ST segment elevation. She was transferred to [facility] and taken to the cath lab by Dr Gwenlyn Found. She had a 60% "in-stent restenosis" within the proximal LAD stent with a fairly focal 70% stenosis of the distal edge on a bend. The circumflex was the infarct-related artery and had a total occlusion in the distal AV groove. There was a moderate sized ramus branch that 70% "in-stent restenosis within the proximal stent. The dominant RCA had a 60% stenosis in the mid to distal portion. The distal stent had moderate in-stent restenosis in the 50% range with an 80% stenosis at the "crux just before the takeoff of the PDA. Her EF at cath was 35-40% with severe inferior and lateral hypokinesia. Distal abdominal aortogram-small distal dominant aneurysm, 95% calcified ostial bilateral common iliac artery stenoses with moderate post stenotic dilatation in the right common iliac artery. There was a 50 mm transstenotic gradient. The patient  underwent successful circumflex PCI and stenting in this setting of an acute inferolateral myocardial infarction with moderate LV dysfunction and diffuse "in-stent restenosis within the distal RCA, proximal ramus and LAD stent. The remainder of her coronary anatomy will be treated medically at this time.  The patient will ultimately require intervention on her iliac arteries. Echocardiogram done 03/07/15 showed her EF to be 60%. She did have a slight bump in her SCr to 1.34 after her PCI. ACE was held at discharge.   Comes back today. Here with her daughter. She feels like she is doing ok. Mostly limited by her knee replacement -did not get to do her therapy. Some fleeting chest pain - notes this if her HR is high - like in the 80's. Otherwise she has had no chest pain. Not dizzy or lightheaded. Not short of breath. Tolerating her medicine. Has had lab drawn prior to my seeing her.   Past Medical History  Diagnosis Date  . Acid reflux   . Hypothyroidism   . MI, old 74  . Hypercholesteremia   . Vertigo     when lays on left side.  . Cancer     basal cell on nose  . Anemia   . Coronary artery disease   . Hypertension   . Arthritis     knees , R shoulder - tx /w injection - 11/2014  . Tobacco abuse   . Abdominal aortic aneurysm   . Peripheral arterial disease     hhigh-grade ostial bilateral calcified iliac  stenosis with claudication    Past Surgical History  Procedure Laterality Date  . Coronary stent placement    . Cardiac catheterization  5 stents  . Tubal ligation    . Cataract extraction w/phaco Left 05/24/2014    Procedure: CATARACT EXTRACTION PHACO AND INTRAOCULAR LENS PLACEMENT (IOC);  Surgeon: Tonny Branch, MD;  Location: [facility];  Service: Ophthalmology;  Laterality: Left;  CDE:  9.30  . Cataract extraction w/phaco Right 06/18/2014    Procedure: CATARACT EXTRACTION PHACO AND INTRAOCULAR LENS PLACEMENT RIGHT EYE CDE=10.84;  Surgeon: Tonny Branch, MD;  Location: [facility];  Service:  Ophthalmology;  Laterality: Right;  . Eye surgery    . Tonsillectomy      age 30  . Partial knee arthroplasty Right 02/04/2015    Procedure: UNICOMPARTMENTAL KNEE;  Surgeon: Dorna Leitz, MD;  Location: [facility];  Service: Orthopedics;  Laterality: Right;  . Left heart catheterization with coronary angiogram N/A 03/06/2015    Procedure: LEFT HEART CATHETERIZATION WITH CORONARY ANGIOGRAM;  Surgeon: Lorretta Harp, MD;  Location: [facility] CATH LAB;  Service: Cardiovascular;  Laterality: N/A;  . Coronary stent placement  03/06/15    CFX DES     Medications: Current Outpatient Prescriptions  Medication Sig Dispense Refill  . acetaminophen (TYLENOL) 325 MG tablet Take 2 tablets (650 mg total) by mouth every 4 (four) hours as needed for headache or mild pain.    Marland Kitchen amLODipine (NORVASC) 10 MG tablet Take 10 mg by mouth daily before breakfast.     . aspirin EC 81 MG tablet Take 81 mg by mouth at bedtime.    Marland Kitchen esomeprazole (NEXIUM) 40 MG capsule TAKE 1 CAPSULE BY MOUTH DAILY AT NOON. 30 capsule 0  . Fish Oil-Cholecalciferol (FISH OIL + D3 PO) Take 1 capsule by mouth daily.    Marland Kitchen HYDROcodone-acetaminophen (NORCO/VICODIN) 5-325 MG per tablet Take 1-2 tablets by mouth every 4 (four) hours as needed for severe pain. 60 tablet 0  . Ketotifen Fumarate (THERA TEARS ALLERGY OP) Apply 1 drop to eye 2 (two) times daily as needed (dry eyes).    Marland Kitchen levothyroxine (SYNTHROID, LEVOTHROID) 50 MCG tablet Take 50 mcg by mouth daily before breakfast.    . methocarbamol (ROBAXIN) 500 MG tablet Take 1 tablet (500 mg total) by mouth every 8 (eight) hours as needed for muscle spasms. 50 tablet 0  . metoprolol tartrate (LOPRESSOR) 25 MG tablet Take 1 tablet (25 mg total) by mouth 2 (two) times daily. 60 tablet 6  . nitroGLYCERIN (NITROSTAT) 0.4 MG SL tablet Place 1 tablet (0.4 mg total) under the tongue every 5 (five) minutes x 3 doses as needed for chest pain. 25 tablet 3  . rosuvastatin (CRESTOR) 20 MG tablet Take 1 tablet (20 mg  total) by mouth daily. 30 tablet 5  . ticagrelor (BRILINTA) 90 MG TABS tablet Take 1 tablet (90 mg total) by mouth 2 (two) times daily. 60 tablet 11   No current facility-administered medications for this visit.    Allergies: Allergies  Allergen Reactions  . Shrimp [Shellfish Allergy] Nausea And Vomiting    Throws up violently    Social History: The patient  reports that she quit smoking about 15 years ago. Her smoking use included Cigarettes. She has a 63 pack-year smoking history. She has never used smokeless tobacco. She reports that she does not drink alcohol or use illicit drugs.   Family History: The patient's family history includes Cancer (age of onset: 69) in her father; Cancer (age of  onset: 7) in her mother; Hypertension in her maternal grandmother; Stroke in her maternal grandfather. There is no history of Heart attack.   Review of Systems: Please see the history of present illness.   All other systems are reviewed and negative.   Physical Exam:  VS:  BP 118/78 mmHg  Pulse 77  Ht 5' 4"  (1.626 m)  Wt 164 lb (74.39 kg)  BMI 28.14 kg/m2  SpO2 97% .  BMI Body mass index is 28.14 kg/(m^2).  Wt Readings from Last 3 Encounters:  04/26/15 164 lb (74.39 kg)  04/10/15 163 lb (73.936 kg)  03/19/15 162 lb 6.4 oz (73.664 kg)    General: Pleasant. Well developed, well nourished and in no acute distress.  HEENT: Normal. Neck: Supple, no JVD, carotid bruits, or masses noted.  Cardiac: Regular rate and rhythm. No murmurs, rubs, or gallops. No edema.  Respiratory:  Lungs are clear to auscultation bilaterally with normal work of breathing.  GI: Soft and nontender.  MS: No deformity or atrophy. Gait and ROM intact. Skin: Warm and dry. Color is normal.  Neuro:  Strength and sensation are intact and no gross focal deficits noted.  Psych: Alert, appropriate and with normal affect.   LABORATORY DATA:  EKG:  EKG is ordered today. This demonstrates NSR with nonspecific T wave  changes.  Lab Results  Component Value Date   WBC 6.3 03/08/2015   HGB 12.4 03/08/2015   HCT 39.5 03/08/2015   PLT 158 03/08/2015   GLUCOSE 105* 03/19/2015   CHOL 166 03/07/2015   TRIG 188* 03/07/2015   HDL 31* 03/07/2015   LDLCALC 97 03/07/2015   ALT 19 03/06/2015   AST 20 03/06/2015   NA 139 03/19/2015   K 3.6 03/19/2015   CL 104 03/19/2015   CREATININE 1.07 03/19/2015   BUN 15 03/19/2015   CO2 29 03/19/2015   TSH 0.844 03/06/2015   INR 1.11 03/07/2015   HGBA1C 5.9* 03/06/2015    BNP (last 3 results)  Recent Labs  02/08/15 2337  BNP 67.3    ProBNP (last 3 results) No results for input(s): PROBNP in the last 8760 hours.   Other Studies Reviewed Today:  Echo Study Conclusions 02/2015  - Left ventricle: The cavity size was normal. Wall thickness was increased in a pattern of mild LVH. Systolic function was normal. The estimated ejection fraction was in the range of 60% to 65%. Doppler parameters are consistent with abnormal left ventricular relaxation (grade 1 diastolic dysfunction).   CARDIAC CATHETERIZATION   ANGIOGRAPHIC RESULTS:   1. Left main; minor irregularities  2. LAD; there was 60% "in-stent restenosis" within the proximal LAD stent with a fairly focal 70% stenosis of the distal edge on a bend. 3. Left circumflex; the circumflex was the infarct-related artery and had total occlusion in the distal AV groove. There was a moderate sized ramus branch that 70% "in-stent restenosis within the proximal stent..  4. Right coronary artery; dominant with 60% stenosis in the mid to distal portion. The distal stent had moderate in-stent restenosis in the 50% range with an 80% stenosis at the "crux just before the takeoff of the PDA. 5. Left ventriculography; RAO left ventriculogram was performed using  25 mL of Visipaque dye at 12 mL/second. The overall LVEF estimated  35-40% with severe inferior and lateral hypokinesia % With/Without wall motion  abnormalities 6. Distal abdominal aortogram-small distal dominant aneurysm, 95% calcified ostial bilateral common iliac artery stenoses with moderate post stenotic dilatation in the right  common iliac artery. There was a 50 mm transstenotic gradient.  IMPRESSION:Mrs. Lymon has an occluded circumflex in the midportion with in-stent restenosis within the RCA, LAD and ramus branch stents. We will proceed with PCI and stenting using Angiomax, Brilenta and drug eluding stent.  Procedure description: The patient received Angiomax bolus with an ACT of 429. Total contrast Mr. To the patient was 190 mL I was unable to pass a 6 [place] XB 3.0 cm guide catheter across the right common iliac origin and therefore changed sheaths, 6 French, 35 cm long sheath which I was able to pass over an 035 wire into the distal abdominal aorta. I crossed the lesion with an 014/190 cm long water guidewire, predilated with a 2 mm x 12 mm balloon and stented the circumflex with a 2.5 mm x 15 mm long Xience drug-eluting stent deployed at 15 atm. I postdilated with a 2.75 mm x 12 mm long noncompliant stenting 18 atm (2.83 mm) resulting reduction with total occlusion to 0% residual with excellent flow and no dissection. The patient tolerated the procedure well. The guidewire and catheter were removed.  Overall impression: Successful circumflex PCI and stenting in this setting of an acute inferolateral myocardial infarction with moderate LV dysfunction and diffuse "in-stent restenosis within the distal RCA, proximal ramus and LAD stent. The remainder of her coronary anatomy will be treated medically at this time. She'll need lifelong antibiotic therapy. We will continue Angiomax for 4 hours before this. The patient will ultimately require intervention on her iliac arteries. I can see her back in the office for her peripheral vascular evaluation when she has recovered from her acute coronary event.  Lorretta Harp MD,  [facility] 03/06/2015 6:46 AM   Distal abdominal aortogram-small distal dominant aneurysm, 95% calcified ostial bilateral common iliac artery stenoses with moderate post stenotic dilatation in the right common iliac artery.    Assessment/Plan:  1. STEMI inferolateral with urgent PCI of the LCX  with moderate LV dysfunction and diffuse "in-stent restenosis within the distal RCA, proximal ramus and LAD stent. The remainder of her coronary anatomy was be treated medically at this time.  Committed to lifelong DAPT  Has had some fleeting chest pain associated with elevated HR - I have increased her Lopressor today to BID and she feels better.   2. Chest pain - related to elevated HR - beta blocker increased.   3. CAD with prior PCI to LAD and RCA  4. PVD - has follow up with Dr. Gwenlyn Found , iliac disease, AAA.  5. HLD -continue with Crestor LDL goal 70  6. ICM 35% at cath - but EF 60% by echo 02/2015  Quite remarkable - volume status looks ok - may need repeat echo in next few months.  No signs of overload.  7. TKR - her ortho rehab was never able to be done due to her heart attack - will try to send her to cardiac rehab - she may need some modifications but I think she will benefit. I think she could try to do some regular PT as well. Will send a note to Dr. Berenice Primas. We will need to get her moving   Current medicines are reviewed with the patient today.  The patient does not have concerns regarding medicines other than what has been noted above.  The following changes have been made:  See above.  Labs/ tests ordered today include:    No orders of the defined types were placed in this encounter.  Disposition:   FU with Dr. Marlou Porch in 6 months.   Patient is agreeable to this plan and will call if any problems develop in the interim.   Signed: Candee Furbish, MD 04/26/2015 4:49 PM  [facility] [address] [place], [place]  [address] Phone: [phone] Fax: [phone]

## 2015-04-26 NOTE — Patient Instructions (Signed)
Medication Instructions:  Your physician recommends that you continue on your current medications as directed. Please refer to the Current Medication list given to you today.  Follow-Up: Follow up in 6 months with Dr. Skains.  You will receive a letter in the mail 2 months before you are due.  Please call us when you receive this letter to schedule your follow up appointment.  Thank you for choosing [facility]!!     

## 2015-04-29 ENCOUNTER — Encounter (HOSPITAL_COMMUNITY): Payer: Self-pay | Admitting: Emergency Medicine

## 2015-04-29 ENCOUNTER — Emergency Department (HOSPITAL_COMMUNITY)
Admission: EM | Admit: 2015-04-29 | Discharge: 2015-04-29 | Disposition: A | Discharge status: Home / Self Care | Payer: Medicare Other | Attending: Emergency Medicine | Admitting: Emergency Medicine

## 2015-04-29 DIAGNOSIS — I10 Essential (primary) hypertension: Secondary | ICD-10-CM | POA: Diagnosis not present

## 2015-04-29 DIAGNOSIS — E780 Pure hypercholesterolemia: Secondary | ICD-10-CM | POA: Diagnosis not present

## 2015-04-29 DIAGNOSIS — Z79899 Other long term (current) drug therapy: Secondary | ICD-10-CM | POA: Diagnosis not present

## 2015-04-29 DIAGNOSIS — Z862 Personal history of diseases of the blood and blood-forming organs and certain disorders involving the immune mechanism: Secondary | ICD-10-CM | POA: Insufficient documentation

## 2015-04-29 DIAGNOSIS — L089 Local infection of the skin and subcutaneous tissue, unspecified: Secondary | ICD-10-CM

## 2015-04-29 DIAGNOSIS — Z9889 Other specified postprocedural states: Secondary | ICD-10-CM | POA: Diagnosis not present

## 2015-04-29 DIAGNOSIS — L723 Sebaceous cyst: Secondary | ICD-10-CM | POA: Diagnosis not present

## 2015-04-29 DIAGNOSIS — K219 Gastro-esophageal reflux disease without esophagitis: Secondary | ICD-10-CM | POA: Insufficient documentation

## 2015-04-29 DIAGNOSIS — Z87891 Personal history of nicotine dependence: Secondary | ICD-10-CM | POA: Insufficient documentation

## 2015-04-29 DIAGNOSIS — I252 Old myocardial infarction: Secondary | ICD-10-CM | POA: Diagnosis not present

## 2015-04-29 DIAGNOSIS — L729 Follicular cyst of the skin and subcutaneous tissue, unspecified: Secondary | ICD-10-CM | POA: Diagnosis present

## 2015-04-29 DIAGNOSIS — Z9861 Coronary angioplasty status: Secondary | ICD-10-CM | POA: Diagnosis not present

## 2015-04-29 DIAGNOSIS — Z7982 Long term (current) use of aspirin: Secondary | ICD-10-CM | POA: Diagnosis not present

## 2015-04-29 DIAGNOSIS — Z85828 Personal history of other malignant neoplasm of skin: Secondary | ICD-10-CM | POA: Insufficient documentation

## 2015-04-29 DIAGNOSIS — I2510 Atherosclerotic heart disease of native coronary artery without angina pectoris: Secondary | ICD-10-CM | POA: Diagnosis not present

## 2015-04-29 DIAGNOSIS — M1990 Unspecified osteoarthritis, unspecified site: Secondary | ICD-10-CM | POA: Diagnosis not present

## 2015-04-29 MED ORDER — CEFTRIAXONE SODIUM 1 G IJ SOLR
1.0000 g | INTRAMUSCULAR | Status: DC
  Administered 2015-04-29: 1 g via INTRAMUSCULAR
  Filled 2015-04-29: qty 10

## 2015-04-29 MED ORDER — SULFAMETHOXAZOLE-TRIMETHOPRIM 800-160 MG PO TABS: 1.0000 | ORAL_TABLET | Freq: Two times a day (BID) | ORAL | Status: AC

## 2015-04-29 MED ORDER — LIDOCAINE HCL (PF) 1 % IJ SOLN
INTRAMUSCULAR | Status: AC
  Administered 2015-04-29: 5 mL
  Filled 2015-04-29: qty 5

## 2015-04-29 MED ORDER — HYDROCODONE-ACETAMINOPHEN 5-325 MG PO TABS: 2.0000 | ORAL_TABLET | ORAL | Status: DC | PRN

## 2015-04-29 MED ORDER — CEPHALEXIN 500 MG PO CAPS: 500.0000 mg | ORAL_CAPSULE | Freq: Four times a day (QID) | ORAL | Status: DC

## 2015-04-29 MED ORDER — LIDOCAINE HCL (PF) 1 % IJ SOLN
5.0000 mL | Freq: Once | INTRAMUSCULAR | Status: AC
  Administered 2015-04-29: 5 mL
  Filled 2015-04-29: qty 5

## 2015-04-29 MED ORDER — DEXTROSE 5 % IV SOLN: 1.0000 g | Freq: Once | INTRAVENOUS | Status: DC

## 2015-04-29 NOTE — ED Provider Notes (Signed)
CSN: [id number]     Arrival date & time 04/29/15  1152 History  This chart was scribed for non-physician practitioner, [name], PA-C, working with [name], MD, by Stephania Fragmin, ED Scribe. This patient was seen in room APFT21/APFT21 and the patient's care was started at 12:52 PM.    Chief Complaint  Patient presents with  . Cyst   The history is provided by the patient. No language interpreter was used.    HPI Comments: Jessica Taylor is a 74 y.o. female currently on blood thinners, who presents to the Emergency Department complaining of a "cyst" on her left medial upper arm that she first noticed 2 months ago as a pea-sized bump, which acutely became more swollen and turned red 3 days ago. Patient's daughter states she has a history of MI, CAD, and AAA. She denies a history of DM.   Past Medical History  Diagnosis Date  . Acid reflux   . Hypothyroidism   . MI, old 34  . Hypercholesteremia   . Vertigo     when lays on left side.  . Cancer     basal cell on nose  . Anemia   . Coronary artery disease   . Hypertension   . Arthritis     knees , R shoulder - tx /w injection - 11/2014  . Tobacco abuse   . Abdominal aortic aneurysm   . Peripheral arterial disease     hhigh-grade ostial bilateral calcified iliac stenosis with claudication   Past Surgical History  Procedure Laterality Date  . Coronary stent placement    . Cardiac catheterization  5 stents  . Tubal ligation    . Cataract extraction w/phaco Left 05/24/2014    Procedure: CATARACT EXTRACTION PHACO AND INTRAOCULAR LENS PLACEMENT (IOC);  Surgeon: Tonny Branch, MD;  Location: [facility];  Service: Ophthalmology;  Laterality: Left;  CDE:  9.30  . Cataract extraction w/phaco Right 06/18/2014    Procedure: CATARACT EXTRACTION PHACO AND INTRAOCULAR LENS PLACEMENT RIGHT EYE CDE=10.84;  Surgeon: Tonny Branch, MD;  Location: [facility];  Service: Ophthalmology;  Laterality: Right;  . Eye surgery    . Tonsillectomy      age 43  . Partial knee  arthroplasty Right 02/04/2015    Procedure: UNICOMPARTMENTAL KNEE;  Surgeon: Dorna Leitz, MD;  Location: [facility];  Service: Orthopedics;  Laterality: Right;  . Left heart catheterization with coronary angiogram N/A 03/06/2015    Procedure: LEFT HEART CATHETERIZATION WITH CORONARY ANGIOGRAM;  Surgeon: Lorretta Harp, MD;  Location: [facility] CATH [facility];  Service: Cardiovascular;  Laterality: N/A;  . Coronary stent placement  03/06/15    CFX DES   Family History  Problem Relation Age of Onset  . Cancer Mother 90  . Cancer Father 66  . Heart attack Neg Hx   . Stroke Maternal Grandfather   . Hypertension Maternal Grandmother    History  Substance Use Topics  . Smoking status: Former Smoker -- 1.50 packs/day for 42 years    Types: Cigarettes    Quit date: 05/19/1999  . Smokeless tobacco: Never Used  . Alcohol Use: No   OB History    No data available     Review of Systems  Skin: Positive for color change (redness).  All other systems reviewed and are negative.  Allergies  Shrimp  Home Medications   Prior to Admission medications   Medication Sig Start Date End Date Taking? Authorizing Provider  acetaminophen (TYLENOL) 325 MG tablet Take 2 tablets (  650 mg total) by mouth every 4 (four) hours as needed for headache or mild pain. 03/08/15   Erlene Quan, PA-C  amLODipine (NORVASC) 10 MG tablet Take 10 mg by mouth daily before breakfast.     Historical Provider, MD  aspirin EC 81 MG tablet Take 81 mg by mouth at bedtime.    Historical Provider, MD  esomeprazole (NEXIUM) 40 MG capsule TAKE 1 CAPSULE BY MOUTH DAILY AT NOON. 04/19/15   Jerline Pain, MD  Fish Oil-Cholecalciferol (FISH OIL + D3 PO) Take 1 capsule by mouth daily.    Historical Provider, MD  HYDROcodone-acetaminophen (NORCO/VICODIN) 5-325 MG per tablet Take 1-2 tablets by mouth every 4 (four) hours as needed for severe pain. 02/06/15   Gary Fleet, PA-C  Ketotifen Fumarate (THERA TEARS ALLERGY OP) Apply 1 drop to eye 2 (two) times  daily as needed (dry eyes).    Historical Provider, MD  levothyroxine (SYNTHROID, LEVOTHROID) 50 MCG tablet Take 50 mcg by mouth daily before breakfast.    Historical Provider, MD  methocarbamol (ROBAXIN) 500 MG tablet Take 1 tablet (500 mg total) by mouth every 8 (eight) hours as needed for muscle spasms. 02/04/15   Gary Fleet, PA-C  metoprolol tartrate (LOPRESSOR) 25 MG tablet Take 1 tablet (25 mg total) by mouth 2 (two) times daily. 03/19/15   Burtis Junes, NP  nitroGLYCERIN (NITROSTAT) 0.4 MG SL tablet Place 1 tablet (0.4 mg total) under the tongue every 5 (five) minutes x 3 doses as needed for chest pain. 03/19/15   Burtis Junes, NP  rosuvastatin (CRESTOR) 20 MG tablet Take 1 tablet (20 mg total) by mouth daily. 08/17/14   Jerline Pain, MD  ticagrelor (BRILINTA) 90 MG TABS tablet Take 1 tablet (90 mg total) by mouth 2 (two) times daily. 03/08/15   Luke K Kilroy, PA-C   BP 131/73 mmHg  Pulse 87  Temp(Src) 98.6 F (37 C) (Oral)  Resp 18  Ht 5\' 4"  (1.626 m)  Wt 163 lb (73.936 kg)  BMI 27.97 kg/m2  SpO2 98% Physical Exam  Constitutional: She is oriented to person, place, and time. She appears well-developed and well-nourished. No distress.  HENT:  Head: Normocephalic and atraumatic.  Eyes: Conjunctivae and EOM are normal.  Neck: Neck supple. No tracheal deviation present.  Cardiovascular: Normal rate.   Pulmonary/Chest: Effort normal. No respiratory distress.  Musculoskeletal: Normal range of motion.  Neurological: She is alert and oriented to person, place, and time.  Skin: Skin is warm and dry.  Psychiatric: She has a normal mood and affect. Her behavior is normal.  Nursing note and vitals reviewed.  ED Course  Procedures (including critical care time)  DIAGNOSTIC STUDIES: Oxygen Saturation is 98% on RA, normal by my interpretation.    COORDINATION OF CARE: 12:52 PM - Discussed treatment plan with pt at bedside which includes antibiotic medications and I&D, and pt agreed  to plan.  INCISION AND DRAINAGE PROCEDURE NOTE: Patient identification was confirmed and verbal consent was obtained. This procedure was performed by [name], PA-C, working with [name], MD at 12:57 PM. Site: medial aspect of right upper arm Sterile procedures observed  Needle size: 25 Anesthetic used (type and amt): 1% lidocaine without epi, 5 cc Top of blister opened with tip of needle. Large cystic core and pink, purulent drainage expressed. Complexity: Complex Site anesthetized, incision made over site, wound drained and explored loculations, rinsed with copious amounts of normal saline, wound packed with sterile gauze, covered with  dry, sterile dressing.  Pt tolerated procedure well without complications.  Instructions for care discussed verbally and pt provided with additional written instructions for homecare and f/u.     MDM   Final diagnoses:  Skin infection  Sebaceous cyst    avs Keflex Doxycycline hydrocoeone Recheck here tomorrow  I personally performed the services in this documentation, which was scribed in my presence.  The recorded information has been reviewed and considered.   Ronnald Collum.   Hollace Kinnier Montross, PA-C 04/29/15 [place], MD 05/02/15 [phone]

## 2015-04-29 NOTE — ED Notes (Signed)
Small bump to left fold of arm.  Bump has increased in size.  C/o pain to site 10/10 when being touch.  Redness noted at area.  Denies fever.

## 2015-04-29 NOTE — Discharge Instructions (Signed)
Epidermal Cyst An epidermal cyst is sometimes called a sebaceous cyst, epidermal inclusion cyst, or infundibular cyst. These cysts usually contain a substance that looks "pasty" or "cheesy" and may have a bad smell. This substance is a protein called keratin. Epidermal cysts are usually found on the face, neck, or trunk. They may also occur in the vaginal area or other parts of the genitalia of both men and women. Epidermal cysts are usually small, painless, slow-growing bumps or lumps that move freely under the skin. It is important not to try to pop them. This may cause an infection and lead to tenderness and swelling. CAUSES  Epidermal cysts may be caused by a deep penetrating injury to the skin or a plugged hair follicle, often associated with acne. SYMPTOMS  Epidermal cysts can become inflamed and cause:  Redness.  Tenderness.  Increased temperature of the skin over the bumps or lumps.  Grayish-white, bad smelling material that drains from the bump or lump. DIAGNOSIS  Epidermal cysts are easily diagnosed by your caregiver during an exam. Rarely, a tissue sample (biopsy) may be taken to rule out other conditions that may resemble epidermal cysts. TREATMENT   Epidermal cysts often get better and disappear on their own. They are rarely ever cancerous.  If a cyst becomes infected, it may become inflamed and tender. This may require opening and draining the cyst. Treatment with antibiotics may be necessary. When the infection is gone, the cyst may be removed with minor surgery.  Small, inflamed cysts can often be treated with antibiotics or by injecting steroid medicines.  Sometimes, epidermal cysts become large and bothersome. If this happens, surgical removal in your caregiver's office may be necessary. HOME CARE INSTRUCTIONS  Only take over-the-counter or prescription medicines as directed by your caregiver.  Take your antibiotics as directed. Finish them even if you start to feel  better. SEEK MEDICAL CARE IF:   Your cyst becomes tender, red, or swollen.  Your condition is not improving or is getting worse.  You have any other questions or concerns. MAKE SURE YOU:  Understand these instructions.  Will watch your condition.  Will get help right away if you are not doing well or get worse. Document Released: 10/10/2004 Document Revised: 02/01/2012 Document Reviewed: 05/18/2011 [facility] Patient Information 2015 [place], [place]. This information is not intended to replace advice given to you by your health care provider. Make sure you discuss any questions you have with your health care provider. Wound Infection A wound infection happens when a type of germ (bacteria) starts growing in the wound. In some cases, this can cause the wound to break open. If cared for properly, the infected wound will heal from the inside to the outside. Wound infections need treatment. CAUSES An infection is caused by bacteria growing in the wound.  SYMPTOMS   Increase in redness, swelling, or pain at the wound site.  Increase in drainage at the wound site.  Wound or bandage (dressing) starts to smell bad.  Fever.  Feeling tired or fatigued.  Pus draining from the wound. TREATMENT  Your health care provider will prescribe antibiotic medicine. The wound infection should improve within 24 to 48 hours. Any redness around the wound should stop spreading and the wound should be less painful.  HOME CARE INSTRUCTIONS   Only take over-the-counter or prescription medicines for pain, discomfort, or fever as directed by your health care provider.  Take your antibiotics as directed. Finish them even if you start to feel better.  Gently wash the area with mild soap and water 2 times a day, or as directed. Rinse off the soap. Pat the area dry with a clean towel. Do not rub the wound. This may cause bleeding.  Follow your health care provider's instructions for how often you need to  change the dressing.  Apply ointment and a dressing to the wound as directed.  If the dressing sticks, moisten it with soapy water and gently remove it.  Change the bandage right away if it becomes wet, dirty, or develops a bad smell.  Take showers. Do not take tub baths, swim, or do anything that may soak the wound until it is healed.  Avoid exercises that make you sweat heavily.  Use anti-itch medicine as directed by your health care provider. The wound may itch when it is healing. Do not pick or scratch at the wound.  Follow up with your health care provider to get your wound rechecked as directed. SEEK MEDICAL CARE IF:  You have an increase in swelling, pain, or redness around the wound.  You have an increase in the amount of pus coming from the wound.  There is a bad smell coming from the wound.  More of the wound breaks open.  You have a fever. MAKE SURE YOU:   Understand these instructions.  Will watch your condition.  Will get help right away if you are not doing well or get worse. Document Released: 08/08/2003 Document Revised: 11/14/2013 Document Reviewed: 03/15/2011 [facility] Patient Information 2015 [place], [place]. This information is not intended to replace advice given to you by your health care provider. Make sure you discuss any questions you have with your health care provider.

## 2015-05-01 ENCOUNTER — Ambulatory Visit (HOSPITAL_COMMUNITY)
Admission: RE | Admit: 2015-05-01 | Discharge: 2015-05-01 | Disposition: A | Discharge status: Home / Self Care | Payer: Medicare Other | Source: Ambulatory Visit | Attending: Cardiovascular Disease | Admitting: Cardiovascular Disease

## 2015-05-01 ENCOUNTER — Other Ambulatory Visit: Payer: Self-pay | Admitting: Cardiovascular Disease

## 2015-05-01 ENCOUNTER — Ambulatory Visit (HOSPITAL_BASED_OUTPATIENT_CLINIC_OR_DEPARTMENT_OTHER)
Admission: RE | Admit: 2015-05-01 | Discharge: 2015-05-01 | Disposition: A | Discharge status: Home / Self Care | Payer: Medicare Other | Source: Ambulatory Visit | Attending: Cardiovascular Disease | Admitting: Cardiovascular Disease

## 2015-05-01 DIAGNOSIS — I519 Heart disease, unspecified: Secondary | ICD-10-CM

## 2015-05-01 DIAGNOSIS — I70203 Unspecified atherosclerosis of native arteries of extremities, bilateral legs: Secondary | ICD-10-CM | POA: Insufficient documentation

## 2015-05-01 DIAGNOSIS — I255 Ischemic cardiomyopathy: Secondary | ICD-10-CM | POA: Diagnosis not present

## 2015-05-01 DIAGNOSIS — I714 Abdominal aortic aneurysm, without rupture, unspecified: Secondary | ICD-10-CM

## 2015-05-01 DIAGNOSIS — I2510 Atherosclerotic heart disease of native coronary artery without angina pectoris: Secondary | ICD-10-CM | POA: Diagnosis not present

## 2015-05-01 DIAGNOSIS — I739 Peripheral vascular disease, unspecified: Secondary | ICD-10-CM | POA: Diagnosis not present

## 2015-05-01 DIAGNOSIS — I252 Old myocardial infarction: Secondary | ICD-10-CM | POA: Diagnosis not present

## 2015-05-01 DIAGNOSIS — Z87891 Personal history of nicotine dependence: Secondary | ICD-10-CM | POA: Diagnosis not present

## 2015-05-01 DIAGNOSIS — E785 Hyperlipidemia, unspecified: Secondary | ICD-10-CM | POA: Diagnosis not present

## 2015-05-01 DIAGNOSIS — I10 Essential (primary) hypertension: Secondary | ICD-10-CM | POA: Diagnosis not present

## 2015-05-10 ENCOUNTER — Telehealth: Payer: Self-pay | Admitting: Cardiovascular Disease

## 2015-05-10 ENCOUNTER — Telehealth: Payer: Self-pay | Admitting: *Deleted

## 2015-05-10 NOTE — Telephone Encounter (Signed)
Called patient and informed of echo results.

## 2015-05-10 NOTE — Telephone Encounter (Signed)
-----   Message from Candis Schatz sent at 05/10/2015  1:22 PM EDT ----- Regarding: procedure on Monday Please call pt. She has questions about procedure on Monday. She call the cath lab.  Thanks Trisha

## 2015-05-10 NOTE — Telephone Encounter (Signed)
Left message for pt to call.

## 2015-05-10 NOTE — Telephone Encounter (Signed)
Pt would like her test results from last Wednesday.She could not remember what test she had.

## 2015-05-13 ENCOUNTER — Ambulatory Visit (HOSPITAL_COMMUNITY)
Admission: RE | Admit: 2015-05-13 | Discharge: 2015-05-13 | Disposition: A | Discharge status: Home / Self Care | Payer: Medicare Other | Source: Ambulatory Visit | Attending: Cardiovascular Disease | Admitting: Cardiovascular Disease

## 2015-05-13 ENCOUNTER — Encounter (HOSPITAL_COMMUNITY): Payer: Self-pay | Admitting: Cardiovascular Disease

## 2015-05-13 ENCOUNTER — Other Ambulatory Visit: Payer: Self-pay | Admitting: *Deleted

## 2015-05-13 ENCOUNTER — Encounter (HOSPITAL_COMMUNITY)
Admission: RE | Disposition: A | Discharge status: Home / Self Care | Payer: Medicare Other | Source: Ambulatory Visit | Attending: Cardiovascular Disease

## 2015-05-13 DIAGNOSIS — I10 Essential (primary) hypertension: Secondary | ICD-10-CM | POA: Diagnosis not present

## 2015-05-13 DIAGNOSIS — Z7982 Long term (current) use of aspirin: Secondary | ICD-10-CM | POA: Insufficient documentation

## 2015-05-13 DIAGNOSIS — I700 Atherosclerosis of aorta: Secondary | ICD-10-CM | POA: Diagnosis not present

## 2015-05-13 DIAGNOSIS — I739 Peripheral vascular disease, unspecified: Secondary | ICD-10-CM | POA: Diagnosis present

## 2015-05-13 DIAGNOSIS — Z87891 Personal history of nicotine dependence: Secondary | ICD-10-CM | POA: Insufficient documentation

## 2015-05-13 DIAGNOSIS — I714 Abdominal aortic aneurysm, without rupture: Secondary | ICD-10-CM | POA: Insufficient documentation

## 2015-05-13 DIAGNOSIS — I2510 Atherosclerotic heart disease of native coronary artery without angina pectoris: Secondary | ICD-10-CM | POA: Insufficient documentation

## 2015-05-13 DIAGNOSIS — E785 Hyperlipidemia, unspecified: Secondary | ICD-10-CM | POA: Diagnosis not present

## 2015-05-13 DIAGNOSIS — I252 Old myocardial infarction: Secondary | ICD-10-CM | POA: Diagnosis not present

## 2015-05-13 DIAGNOSIS — I70213 Atherosclerosis of native arteries of extremities with intermittent claudication, bilateral legs: Secondary | ICD-10-CM | POA: Diagnosis not present

## 2015-05-13 DIAGNOSIS — I708 Atherosclerosis of other arteries: Secondary | ICD-10-CM | POA: Insufficient documentation

## 2015-05-13 DIAGNOSIS — Z91013 Allergy to seafood: Secondary | ICD-10-CM | POA: Insufficient documentation

## 2015-05-13 DIAGNOSIS — I77819 Aortic ectasia, unspecified site: Secondary | ICD-10-CM

## 2015-05-13 DIAGNOSIS — Z955 Presence of coronary angioplasty implant and graft: Secondary | ICD-10-CM | POA: Diagnosis not present

## 2015-05-13 DIAGNOSIS — Z96659 Presence of unspecified artificial knee joint: Secondary | ICD-10-CM | POA: Insufficient documentation

## 2015-05-13 HISTORY — PX: PERIPHERAL VASCULAR CATHETERIZATION: SHX172C

## 2015-05-13 LAB — BASIC METABOLIC PANEL
Anion gap: 8 (ref 5–15)
BUN: 25 mg/dL — ABNORMAL HIGH (ref 6–20)
CO2: 23 mmol/L (ref 22–32)
CREATININE: 1.36 mg/dL — AB (ref 0.44–1.00)
Calcium: 9.6 mg/dL (ref 8.9–10.3)
Chloride: 105 mmol/L (ref 101–111)
GFR, EST AFRICAN AMERICAN: 43 mL/min — AB (ref >60)
GFR, EST NON AFRICAN AMERICAN: 37 mL/min — AB (ref >60)
Glucose, Bld: 103 mg/dL — ABNORMAL HIGH (ref 65–99)
POTASSIUM: 4.8 mmol/L (ref 3.5–5.1)
Sodium: 136 mmol/L (ref 135–145)

## 2015-05-13 LAB — CBC
HEMATOCRIT: 38.4 % (ref 36.0–46.0)
Hemoglobin: 12.3 g/dL (ref 12.0–15.0)
MCH: 28 pg (ref 26.0–34.0)
MCHC: 32 g/dL (ref 30.0–36.0)
MCV: 87.5 fL (ref 78.0–100.0)
Platelets: 187 10*3/uL (ref 150–400)
RBC: 4.39 MIL/uL (ref 3.87–5.11)
RDW: 15.1 % (ref 11.5–15.5)
WBC: 5.4 10*3/uL (ref 4.0–10.5)

## 2015-05-13 LAB — PROTIME-INR
INR: 0.94 (ref 0.00–1.49)
Prothrombin Time: 12.8 seconds (ref 11.6–15.2)

## 2015-05-13 SURGERY — LOWER EXTREMITY ANGIOGRAPHY
Anesthesia: LOCAL

## 2015-05-13 MED ORDER — MIDAZOLAM HCL 2 MG/2ML IJ SOLN
INTRAMUSCULAR | Status: AC
  Filled 2015-05-13: qty 2

## 2015-05-13 MED ORDER — ASPIRIN 81 MG PO CHEW
CHEWABLE_TABLET | ORAL | Status: AC
  Administered 2015-05-13: 81 mg via ORAL
  Filled 2015-05-13: qty 1

## 2015-05-13 MED ORDER — SODIUM CHLORIDE 0.9 % WEIGHT BASED INFUSION: 1.0000 mL/kg/h | INTRAVENOUS | Status: DC

## 2015-05-13 MED ORDER — MORPHINE SULFATE 2 MG/ML IJ SOLN
2.0000 mg | INTRAMUSCULAR | Status: DC | PRN
Start: 2015-05-13 — End: 2015-05-13

## 2015-05-13 MED ORDER — LIDOCAINE HCL (PF) 1 % IJ SOLN
INTRAMUSCULAR | Status: AC
  Filled 2015-05-13: qty 60

## 2015-05-13 MED ORDER — FENTANYL CITRATE (PF) 100 MCG/2ML IJ SOLN
INTRAMUSCULAR | Status: DC | PRN
  Administered 2015-05-13: 25 ug via INTRAVENOUS

## 2015-05-13 MED ORDER — SODIUM CHLORIDE 0.9 % IV SOLN: INTRAVENOUS | Status: AC

## 2015-05-13 MED ORDER — MIDAZOLAM HCL 2 MG/2ML IJ SOLN
INTRAMUSCULAR | Status: DC | PRN
  Administered 2015-05-13: 1 mg via INTRAVENOUS

## 2015-05-13 MED ORDER — ACETAMINOPHEN 325 MG PO TABS: 650.0000 mg | ORAL_TABLET | ORAL | Status: DC | PRN

## 2015-05-13 MED ORDER — IODIXANOL 320 MG/ML IV SOLN
INTRAVENOUS | Status: DC | PRN
  Administered 2015-05-13: 133 mL via INTRAVENOUS

## 2015-05-13 MED ORDER — HEPARIN (PORCINE) IN NACL 2-0.9 UNIT/ML-% IJ SOLN
INTRAMUSCULAR | Status: AC
  Filled 2015-05-13: qty 1000

## 2015-05-13 MED ORDER — SODIUM CHLORIDE 0.9 % IJ SOLN: 3.0000 mL | INTRAMUSCULAR | Status: DC | PRN

## 2015-05-13 MED ORDER — LIDOCAINE HCL (PF) 1 % IJ SOLN
INTRAMUSCULAR | Status: DC | PRN
  Administered 2015-05-13: 30 mL

## 2015-05-13 MED ORDER — ONDANSETRON HCL 4 MG/2ML IJ SOLN: 4.0000 mg | Freq: Four times a day (QID) | INTRAMUSCULAR | Status: DC | PRN

## 2015-05-13 MED ORDER — ASPIRIN 81 MG PO CHEW
81.0000 mg | CHEWABLE_TABLET | ORAL | Status: AC
  Administered 2015-05-13: 81 mg via ORAL

## 2015-05-13 MED ORDER — SODIUM CHLORIDE 0.9 % WEIGHT BASED INFUSION
3.0000 mL/kg/h | INTRAVENOUS | Status: DC
  Administered 2015-05-13: 3 mL/kg/h via INTRAVENOUS

## 2015-05-13 MED ORDER — FENTANYL CITRATE (PF) 100 MCG/2ML IJ SOLN
INTRAMUSCULAR | Status: AC
  Filled 2015-05-13: qty 2

## 2015-05-13 SURGICAL SUPPLY — 12 items
CATH ANGIO 5F PIGTAIL 65CM (CATHETERS) ×1 IMPLANT
HAND CONTROLLER AVANTA (MISCELLANEOUS) ×1 IMPLANT
KIT PV (KITS) ×3 IMPLANT
SET AVANTA MULTI PATIENT (MISCELLANEOUS) IMPLANT
SET AVANTA SINGLE PATIENT (MISCELLANEOUS) IMPLANT
SHEATH AVANTA HAND CONTROLLER (MISCELLANEOUS) ×1 IMPLANT
SHEATH BRITE TIP 7FR 35CM (SHEATH) ×2 IMPLANT
SHEATH PINNACLE 7F 10CM (SHEATH) ×1 IMPLANT
SYR MEDRAD MARK V 150ML (SYRINGE) IMPLANT
TRANSDUCER W/STOPCOCK (MISCELLANEOUS) ×3 IMPLANT
TRAY PV CATH (CUSTOM PROCEDURE TRAY) ×3 IMPLANT
WIRE HITORQ VERSACORE ST 145CM (WIRE) ×1 IMPLANT

## 2015-05-13 NOTE — Progress Notes (Signed)
Site area: rt groin Site Prior to Removal:  Level 0 Pressure Applied For: 20 minutes Manual:   yes Patient Status During Pull:  stable Post Pull Site:  Level  0 Post Pull Instructions Given:  yes Post Pull Pulses Present: yes Dressing Applied:  tegaderm Bedrest begins @  [id number] Comments: none

## 2015-05-13 NOTE — Progress Notes (Signed)
Ambulated to bathroom without difficulty, no bleeding noted from left groin area.

## 2015-05-13 NOTE — Discharge Instructions (Signed)

## 2015-05-13 NOTE — Progress Notes (Signed)
Verbal order received from Dr Gwenlyn Found to order a CT angio of chest, abd and pelvis and refer patient to Dr Trula Slade due to [facility] angio results.

## 2015-05-13 NOTE — H&P (Signed)
Primary Physician [name], MD Primary Cardiologist: Lorretta Harp MD Renae Gloss   HPI: [name] is a 74 year old married Caucasian female mother of 67, grandmother and 6 grandchildren with a past history of coronary stenting performed by Dr. Quay Burow., hypertension and hyperlipidemia. She was hospitalized several months ago with hypoxia of unclear etiology. She did have a total knee replacement prior to that admission. CT abdomen was negative for PE. A 2-D echo was normal. A Myoview stress test showed mild inferobasal ischemia and she was deemed stable to be discharged home and treated medically. She's had off-and-on chest pain over the last several days prior to admission. She had chest pain the morning on admission at 2 AM which awakened her from sleep and she was brought to [facility] emergency room where her EKG showed inferolateral ST segment elevation. She was transferred by EMS to [facility] for urgent intervention. I performed cardiac catheterization on her via the right femoral approach revealed an occluded mid circumflex artery which I stented. Her EF by cath was 35% by echo the following day 60%. 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 She does have lifestyle limiting claudication.   Current Outpatient Prescriptions  Medication Sig Dispense Refill  . acetaminophen (TYLENOL) 325 MG tablet Take 2 tablets (650 mg total) by mouth every 4 (four) hours as needed for headache or mild pain.    Marland Kitchen amLODipine (NORVASC) 10 MG tablet Take 10 mg by mouth daily before breakfast.     . aspirin EC 81 MG tablet Take 81 mg by mouth at bedtime.    . Fish Oil-Cholecalciferol (FISH OIL + D3 PO) Take 1 capsule by mouth daily.    Marland Kitchen HYDROcodone-acetaminophen (NORCO/VICODIN) 5-325 MG per tablet Take 1-2  tablets by mouth every 4 (four) hours as needed for severe pain. 60 tablet 0  . Ketotifen Fumarate (THERA TEARS ALLERGY OP) Apply 1 drop to eye 2 (two) times daily as needed (dry eyes).    Marland Kitchen levothyroxine (SYNTHROID, LEVOTHROID) 50 MCG tablet Take 50 mcg by mouth daily before breakfast.    . methocarbamol (ROBAXIN) 500 MG tablet Take 1 tablet (500 mg total) by mouth every 8 (eight) hours as needed for muscle spasms. 50 tablet 0  . metoprolol tartrate (LOPRESSOR) 25 MG tablet Take 1 tablet (25 mg total) by mouth 2 (two) times daily. 60 tablet 6  . NEXIUM 40 MG capsule TAKE ONE CAPSULE BY MOUTH ONCE DAILY AT NOON 30 capsule 0  . nitroGLYCERIN (NITROSTAT) 0.4 MG SL tablet Place 1 tablet (0.4 mg total) under the tongue every 5 (five) minutes x 3 doses as needed for chest pain. 25 tablet 3  . rosuvastatin (CRESTOR) 20 MG tablet Take 1 tablet (20 mg total) by mouth daily. 30 tablet 5  . ticagrelor (BRILINTA) 90 MG TABS tablet Take 1 tablet (90 mg total) by mouth 2 (two) times daily. 60 tablet 11   No current facility-administered medications for this visit.    Allergies  Allergen Reactions  . Shrimp [Shellfish Allergy] Nausea And Vomiting    Throws up violently    History   Social History  . Marital Status: Married    Spouse Name: N/A  . Number of Children: N/A  . Years of Education: N/A   Occupational History  . Not on file.   Social History Main Topics  . Smoking status: Former  Smoker -- 1.50 packs/day for 42 years    Types: Cigarettes    Quit date: 05/19/1999  . Smokeless tobacco: Never Used  . Alcohol Use: No  . Drug Use: No  . Sexual Activity: Yes    Birth Control/ Protection: Surgical   Other Topics Concern  . Not on file   Social History Narrative     Review of Systems: General: negative for chills, fever, night sweats or weight changes.   Cardiovascular: negative for chest pain, dyspnea on exertion, edema, orthopnea, palpitations, paroxysmal nocturnal dyspnea or shortness of breath Dermatological: negative for rash Respiratory: negative for cough or wheezing Urologic: negative for hematuria Abdominal: negative for nausea, vomiting, diarrhea, bright red blood per rectum, melena, or hematemesis Neurologic: negative for visual changes, syncope, or dizziness All other systems reviewed and are otherwise negative except as noted above.    Blood pressure 142/54, pulse 72, height 5\' 4"  (1.626 m), weight 163 lb (73.936 kg).  General appearance: alert and no distress Neck: no adenopathy, no carotid bruit, no JVD, supple, symmetrical, trachea midline and thyroid not enlarged, symmetric, no tenderness/mass/nodules Lungs: clear to auscultation bilaterally Heart: regular rate and rhythm, S1, S2 normal, no murmur, click, rub or gallop Extremities: diminished femoral pulses bilaterally  EKG not performed today  ASSESSMENT AND PLAN:   Aortoiliac occlusive disease Mrs. Arvie returns today for follow-up of her peripheral arterial disease. She had a STEMI on 03/06/15 secondary to occluded circumflex which I intervened on. The right femoral approach. She does have moderate residual CAD but is asymptomatic. 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 The patient does have lifestyle limiting claudication which he attributed to arthritis and sciatica in the past.I'm going to get an abdominal ultrasound to demonstrate the size of the aneurysm as well as lower extremity partial Doppler studies. If the aneurysm is small she will be candidate for diamondback atherectomy and stenting of her bilateral iliac artery ostia using ICast covered  stents.       Lorretta Harp MD FACP,FACC,FAHA, FSCAI Lorretta Harp, M.D., [place], [facility], FAHA, [facility] [address] [place], [place]  [address]  [phone] 05/13/2015 8:53 AM

## 2015-05-14 ENCOUNTER — Other Ambulatory Visit: Payer: Self-pay | Admitting: *Deleted

## 2015-05-14 ENCOUNTER — Telehealth: Payer: Self-pay | Admitting: Cardiovascular Disease

## 2015-05-14 DIAGNOSIS — T50995S Adverse effect of other drugs, medicaments and biological substances, sequela: Secondary | ICD-10-CM

## 2015-05-14 MED ORDER — DIPHENHYDRAMINE HCL 50 MG/ML IJ SOLN: 25.0000 mg | Freq: Once | INTRAMUSCULAR | Status: DC

## 2015-05-14 MED ORDER — METHYLPREDNISOLONE SODIUM SUCC 125 MG IJ SOLR: 60.0000 mg | Freq: Once | INTRAMUSCULAR | Status: DC

## 2015-05-14 MED ORDER — SODIUM CHLORIDE 0.9 % IV SOLN: 20.0000 mg | Freq: Once | INTRAVENOUS | Status: DC

## 2015-05-14 MED FILL — Heparin Sodium (Porcine) 2 Unit/ML in Sodium Chloride 0.9%: INTRAMUSCULAR | Qty: 1000 | Status: AC

## 2015-05-14 NOTE — Telephone Encounter (Signed)
Spoke with patient regarding CTA chest, abd and pelvis that is scheduled for 05/23/15 @ 10:00 am  Arrival time 9:45am at [facility]---NPO 4 hrs prior to study.   Appointment with Dr. Ophelia Shoulder 06/10/15 @ 9:30 am--arrival time [address] phone [phone].  Patient voiced her understanding regarding both appointments.

## 2015-05-21 ENCOUNTER — Other Ambulatory Visit: Payer: Self-pay | Admitting: Cardiology

## 2015-05-23 ENCOUNTER — Encounter (HOSPITAL_COMMUNITY): Payer: Self-pay

## 2015-05-23 ENCOUNTER — Ambulatory Visit (HOSPITAL_COMMUNITY)
Admission: RE | Admit: 2015-05-23 | Discharge: 2015-05-23 | Disposition: A | Discharge status: Home / Self Care | Payer: Medicare Other | Source: Ambulatory Visit | Attending: Cardiovascular Disease | Admitting: Cardiovascular Disease

## 2015-05-23 DIAGNOSIS — K449 Diaphragmatic hernia without obstruction or gangrene: Secondary | ICD-10-CM | POA: Insufficient documentation

## 2015-05-23 DIAGNOSIS — N281 Cyst of kidney, acquired: Secondary | ICD-10-CM | POA: Diagnosis not present

## 2015-05-23 DIAGNOSIS — I723 Aneurysm of iliac artery: Secondary | ICD-10-CM | POA: Insufficient documentation

## 2015-05-23 DIAGNOSIS — I2510 Atherosclerotic heart disease of native coronary artery without angina pectoris: Secondary | ICD-10-CM | POA: Diagnosis not present

## 2015-05-23 DIAGNOSIS — I714 Abdominal aortic aneurysm, without rupture: Secondary | ICD-10-CM | POA: Diagnosis present

## 2015-05-23 DIAGNOSIS — I708 Atherosclerosis of other arteries: Secondary | ICD-10-CM | POA: Diagnosis not present

## 2015-05-23 DIAGNOSIS — I700 Atherosclerosis of aorta: Secondary | ICD-10-CM | POA: Diagnosis not present

## 2015-05-23 DIAGNOSIS — K5730 Diverticulosis of large intestine without perforation or abscess without bleeding: Secondary | ICD-10-CM | POA: Insufficient documentation

## 2015-05-23 DIAGNOSIS — I70203 Unspecified atherosclerosis of native arteries of extremities, bilateral legs: Secondary | ICD-10-CM | POA: Diagnosis not present

## 2015-05-23 DIAGNOSIS — I77819 Aortic ectasia, unspecified site: Secondary | ICD-10-CM

## 2015-05-23 MED ORDER — IOHEXOL 350 MG/ML SOLN
100.0000 mL | Freq: Once | INTRAVENOUS | Status: AC | PRN
  Administered 2015-05-23: 100 mL via INTRAVENOUS

## 2015-06-06 ENCOUNTER — Encounter: Payer: Self-pay | Admitting: Surgery

## 2015-06-10 ENCOUNTER — Encounter: Payer: Self-pay | Admitting: Surgery

## 2015-06-10 ENCOUNTER — Ambulatory Visit (INDEPENDENT_AMBULATORY_CARE_PROVIDER_SITE_OTHER): Payer: Medicare Other | Admitting: Surgery

## 2015-06-10 VITALS — BP 119/73 | HR 59 | Ht 64.0 in | Wt 160.8 lb

## 2015-06-10 DIAGNOSIS — I70219 Atherosclerosis of native arteries of extremities with intermittent claudication, unspecified extremity: Secondary | ICD-10-CM | POA: Diagnosis not present

## 2015-06-10 NOTE — Patient Instructions (Signed)

## 2015-06-10 NOTE — Progress Notes (Signed)
Patient name: Jessica Taylor MRN: [id number] DOB: [birth year] Sex: female   Referred by: Dr. Gwenlyn Found  Reason for referral:  Chief Complaint  Patient presents with  . New Evaluation    eval atherosclerotic changes of abdominal aorta    HISTORY OF PRESENT ILLNESS: This is a 74 year old female who is referred today for bilateral claudication as well as a infrarenal abdominal aortic aneurysm.  Approximately 6-8 months ago she had a knee replacement.  However recently she had several episodes of on and off chest pain which ultimately led to hospitalization cardiac catheterization showed an occluded mid circumflex artery which was stented.  Ejection fraction prior to this was 35%.  After was 60%.  SHe is on Brilinta.  During the procedure, iliac stenosis and a small aneurysm was identified.  The patient states that she gets significant calf pain bilaterally within 20 feet of ambulation.  The pain on the left leg goes up into the hip.  She did have a knee replacement thinking this was the source of her pain, however she has had minimal benefit following knee replacement.  The patient is medically managed for hypercholesterolemia with a statin.  She is medically managed for hypertension.  She is a former smoker  Past Medical History  Diagnosis Date  . Acid reflux   . Hypothyroidism   . 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 Hypercholesteremia   . Vertigo     when lays on left side.  . Cancer     basal cell on nose  . Anemia   . Coronary artery disease   . Hypertension   . Arthritis     knees , R shoulder - tx /w injection - 11/2014  . Tobacco abuse   . Abdominal aortic aneurysm   . Peripheral arterial disease     hhigh-grade ostial bilateral calcified iliac stenosis with claudication    Past Surgical History  Procedure Laterality Date  . Coronary stent placement    . Cardiac catheterization  5 stents  . Tubal ligation    . Cataract extraction w/phaco Left 05/24/2014    Procedure: CATARACT EXTRACTION  PHACO AND INTRAOCULAR LENS PLACEMENT (IOC);  Surgeon: Tonny Branch, MD;  Location: [facility];  Service: Ophthalmology;  Laterality: Left;  CDE:  9.30  . Cataract extraction w/phaco Right 06/18/2014    Procedure: CATARACT EXTRACTION PHACO AND INTRAOCULAR LENS PLACEMENT RIGHT EYE CDE=10.84;  Surgeon: Tonny Branch, MD;  Location: [facility];  Service: Ophthalmology;  Laterality: Right;  . Eye surgery    . Tonsillectomy      age 28  . Partial knee arthroplasty Right 02/04/2015    Procedure: UNICOMPARTMENTAL KNEE;  Surgeon: Dorna Leitz, MD;  Location: [facility];  Service: Orthopedics;  Laterality: Right;  . Left heart catheterization with coronary angiogram N/A 03/06/2015    Procedure: LEFT HEART CATHETERIZATION WITH CORONARY ANGIOGRAM;  Surgeon: Lorretta Harp, MD;  Location: [facility];  Service: Cardiovascular;  Laterality: N/A;  . Coronary stent placement  03/06/15    CFX DES  . Peripheral vascular catheterization Bilateral 05/13/2015    Procedure: Lower Extremity Angiography;  Surgeon: Lorretta Harp, MD;  Location: [facility];  Service: Cardiovascular;  Laterality: Bilateral;  . Peripheral vascular catheterization N/A 05/13/2015    Procedure: Abdominal Aortogram;  Surgeon: Lorretta Harp, MD;  Location: [facility];  Service: Cardiovascular;  Laterality: N/A;    History   Social History  . Marital Status: Married    Spouse  Name: N/A  . Number of Children: N/A  . Years of Education: N/A   Occupational History  . Not on file.   Social History Main Topics  . Smoking status: Former Smoker -- 1.50 packs/day for 42 years    Types: Cigarettes    Quit date: 05/19/1999  . Smokeless tobacco: Never Used  . Alcohol Use: No  . Drug Use: No  . Sexual Activity: Yes    Birth Control/ Protection: Surgical   Other Topics Concern  . Not on file   Social History Narrative    Family History  Problem Relation Age of Onset  . Cancer Mother 15  . Cancer Father 51  . Heart attack Neg Hx   .  Stroke Maternal Grandfather   . Hypertension Maternal Grandmother   . Hypertension Son     Allergies as of 06/10/2015 - Review Complete 06/10/2015  Allergen Reaction Noted  . Shrimp [shellfish allergy] Nausea And Vomiting 05/18/2014    Current Outpatient Prescriptions on File Prior to Visit  Medication Sig Dispense Refill  . amLODipine (NORVASC) 10 MG tablet Take 10 mg by mouth daily before breakfast.     . aspirin EC 81 MG tablet Take 81 mg by mouth at bedtime.    Marland Kitchen esomeprazole (NEXIUM) 40 MG capsule TAKE 1 CAPSULE BY MOUTH DAILY AT NOON. 30 capsule 5  . HYDROcodone-acetaminophen (NORCO/VICODIN) 5-325 MG per tablet Take 2 tablets by mouth every 4 (four) hours as needed. 10 tablet 0  . Ketotifen Fumarate (THERA TEARS ALLERGY OP) Apply 1 drop to eye 2 (two) times daily as needed (dry eyes).    Marland Kitchen levothyroxine (SYNTHROID, LEVOTHROID) 50 MCG tablet Take 50 mcg by mouth daily before breakfast.    . metoprolol tartrate (LOPRESSOR) 25 MG tablet Take 1 tablet (25 mg total) by mouth 2 (two) times daily. 60 tablet 6  . nitroGLYCERIN (NITROSTAT) 0.4 MG SL tablet Place 1 tablet (0.4 mg total) under the tongue every 5 (five) minutes x 3 doses as needed for chest pain. 25 tablet 3  . rosuvastatin (CRESTOR) 20 MG tablet Take 1 tablet (20 mg total) by mouth daily. 30 tablet 5  . ticagrelor (BRILINTA) 90 MG TABS tablet Take 1 tablet (90 mg total) by mouth 2 (two) times daily. 60 tablet 11   Current Facility-Administered Medications on File Prior to Visit  Medication Dose Route Frequency Provider Last Rate Last Dose  . diphenhydrAMINE (BENADRYL) injection 25 mg  25 mg Intravenous Once Lorretta Harp, MD      . famotidine (PEPCID) 20 mg in sodium chloride 0.9 % 50 mL IVPB  20 mg Intravenous Once Lorretta Harp, MD      . methylPREDNISolone sodium succinate (SOLU-MEDROL) 125 mg/2 mL injection 60 mg  60 mg Intramuscular Once Lorretta Harp, MD         REVIEW OF SYSTEMS: Cardiovascular: No chest  pain, chest pressure.  Positive for pain in her legs with walking and leg swelling Pulmonary: No productive cough, asthma or wheezing. Neurologic: Positive for leg weakness.  No paresthesias, aphasia, or amaurosis. No dizziness. Hematologic: No bleeding problems or clotting disorders. Musculoskeletal: No joint pain or joint swelling. Gastrointestinal: No blood in stool or hematemesis Genitourinary: No dysuria or hematuria. Psychiatric:: No history of major depression. Integumentary: No rashes or ulcers. Constitutional: No fever or chills.  PHYSICAL EXAMINATION:  Filed Vitals:   06/10/15 0927  BP: 119/73  Pulse: 59  Height: 5\' 4"  (1.626 m)  Weight: 160 lb 12.8  oz (72.938 kg)  SpO2: 100%   Body mass index is 27.59 kg/(m^2). General: The patient appears their stated age.   HEENT:  No gross abnormalities Pulmonary: Respirations are non-labored Abdomen: Soft and non-tender  Musculoskeletal: There are no major deformities.   Neurologic: No focal weakness or paresthesias are detected, Skin: There are no ulcer or rashes noted. Psychiatric: The patient has normal affect. Cardiovascular: There is a regular rate and rhythm without significant murmur appreciated. No carotid bruits.  Pedal pulses are not palpable.  Diagnostic Studies: I have reviewed her CT scan with the following findings: 1. Advanced atherosclerosis of the abdominal aorta with mild irregular aneurysmal dilatation of the distal aorta up to 3.3 cm. Aneurysmal dilatation continues to just above the aortic bifurcation. 2. Significant atherosclerosis involving origins of both common iliac arteries with subtotal occlusive disease on the left and high-grade stenosis on the right. Associated poststenotic aneurysmal dilatation noted of the right common iliac artery. There also is evidence of significant disease involving the distal left common iliac artery just prior to its bifurcation. The proximal left external iliac  artery also shows moderate disease with roughly 50% stenosis. 3. Moderate proximal SMA stenosis of 60- 75%. 4. Two separate right renal arteries with inferior right renal artery showing moderate range origin stenosis.  I have also reviewed her angiogram shows aneurysmal changes in the aorta and a high-grade iliac stenosis which are heavily calcified   Assessment:  Bilateral claudication with proximal iliac stenosis Infrarenal abdominal aortic aneurysm Plan: After reviewing the patient's CT scan and aortogram, I feel the best operation for her will be an aortobifemoral bypass graft.  This will address both the aneurysm of the aorta as well as the iliac stenosis.  I do not think that I will be able to get a endovascular stent graft across the aortic bifurcation.  I discussed with the patient that because she had a drug-eluting coronary stent placement at the time of her  NSTEMI, she will need to stay on antiplatelets therapy for at least a year.  I would be unwilling to perform an aortobifemoral bypass graft with her on antiplatelets medication.  We therefore discussed potentially trying percutaneous revascularization and subsequent stenting of her iliac bifurcation.  I will discuss this further with Dr. Gwenlyn Found and get back in touch with the patient.     Eldridge Abrahams, M.D. [facility] Office: [phone] Pager:  [phone]

## 2015-06-12 NOTE — Progress Notes (Signed)
Which you like me to percutaneously fix her iliac bifurcation

## 2015-06-19 ENCOUNTER — Encounter: Payer: Self-pay | Admitting: *Deleted

## 2015-06-25 ENCOUNTER — Ambulatory Visit (INDEPENDENT_AMBULATORY_CARE_PROVIDER_SITE_OTHER): Payer: Medicare Other | Admitting: Cardiovascular Disease

## 2015-06-25 ENCOUNTER — Encounter: Payer: Self-pay | Admitting: Cardiovascular Disease

## 2015-06-25 VITALS — BP 154/72 | HR 68 | Ht 64.0 in | Wt 159.3 lb

## 2015-06-25 DIAGNOSIS — D689 Coagulation defect, unspecified: Secondary | ICD-10-CM

## 2015-06-25 DIAGNOSIS — Z79899 Other long term (current) drug therapy: Secondary | ICD-10-CM | POA: Diagnosis not present

## 2015-06-25 DIAGNOSIS — I739 Peripheral vascular disease, unspecified: Secondary | ICD-10-CM

## 2015-06-25 LAB — PROTIME-INR
INR: 1 (ref <1.50)
Prothrombin Time: 13.2 seconds (ref 11.6–15.2)

## 2015-06-25 LAB — APTT: aPTT: 37 seconds (ref 24–37)

## 2015-06-25 NOTE — Assessment & Plan Note (Signed)
Jessica Taylor has a small abdominal aortic aneurysm and high-grade bilateral calcified ostial iliac disease. She has had a recent coronary intervention and is on dual antiplatelets therapy and has had a coronary intervention in the recent past. I performed angiography on her 05/13/15 demonstrating her anatomy however I was somewhat hesitant to proceed with orbital atherectomy because of fear of embolization because affected diffusely diseased aorta. I ultimately referred her to Dr. Trula Slade  for consideration of aortobifemoral bypass grafting however hhe thought she was not a candidate because of her Ancef therapy to perform an open procedure and did not think she was a candidate for an endoluminal stent graft. Because she was significantly symptomatic he referred her back to me for reconsideration of endovascular therapy. I discussed the procedure with the patient and her husband. We will proceed with attempt at orbital atherectomy making careful attention not to pass the burr into the distal aorta and covered stenting using ICast stents RV bifurcation.

## 2015-06-25 NOTE — Progress Notes (Signed)
Jessica Taylor has a small abdominal aortic aneurysm and high-grade bilateral calcified ostial iliac disease. She has had a recent coronary intervention and is on dual antiplatelets therapy and has had a coronary intervention in the recent past. I performed angiography on her 05/13/15 demonstrating her anatomy however I was somewhat hesitant to proceed with orbital atherectomy because of fear of embolization because affected diffusely diseased aorta. I ultimately referred her to Dr. Trula Slade  for consideration of aortobifemoral bypass grafting however hhe thought she was not a candidate because of her Ancef therapy to perform an open procedure and did not think she was a candidate for an endoluminal stent graft. Because she was significantly symptomatic he referred her back to me for reconsideration of endovascular therapy. I discussed the procedure with the patient and her husband. We will proceed with attempt at orbital atherectomy making careful attention not to pass the burr into the distal aorta and covered stenting using ICast stents of the iliac bifurcation.   Lorretta Harp, M.D., [place], [facility], [place] [address] [place], [place]  [address]  [phone] 06/25/2015 3:04 PM

## 2015-06-25 NOTE — Patient Instructions (Signed)
Dr. Gwenlyn Found has ordered a peripheral angiogram to be done at [facility].  This procedure is going to look at the bloodflow in your lower extremities.  If Dr. Gwenlyn Found is able to open up the arteries, you will have to spend one night in the hospital.  If he is not able to open the arteries, you will be able to go home that same day.    After the procedure, you will not be allowed to drive for 3 days or push, pull, or lift anything greater than 10 lbs for one week.    You will be required to have the following tests prior to the procedure:  1. Blood work-the blood work can be done no more than 7 days prior to the procedure.  It can be done at any [facility] lab.  There is one downstairs on the first floor of this building and one in the [facility] building [phone] [address])  *REPS Sherren Mocha and [employer]

## 2015-06-26 ENCOUNTER — Encounter: Payer: Self-pay | Admitting: Cardiovascular Disease

## 2015-06-26 LAB — CBC
HEMATOCRIT: 40.4 % (ref 36.0–46.0)
Hemoglobin: 12.9 g/dL (ref 12.0–15.0)
MCH: 27.6 pg (ref 26.0–34.0)
MCHC: 31.9 g/dL (ref 30.0–36.0)
MCV: 86.5 fL (ref 78.0–100.0)
MPV: 10.8 fL (ref 8.6–12.4)
Platelets: 218 10*3/uL (ref 150–400)
RBC: 4.67 MIL/uL (ref 3.87–5.11)
RDW: 15.8 % — ABNORMAL HIGH (ref 11.5–15.5)
WBC: 7.1 10*3/uL (ref 4.0–10.5)

## 2015-06-26 LAB — BASIC METABOLIC PANEL
BUN: 20 mg/dL (ref 7–25)
CHLORIDE: 106 mmol/L (ref 98–110)
CO2: 28 mmol/L (ref 20–31)
Calcium: 9.7 mg/dL (ref 8.6–10.4)
Creat: 1.01 mg/dL — ABNORMAL HIGH (ref 0.60–0.93)
Glucose, Bld: 85 mg/dL (ref 65–99)
Potassium: 4.6 mmol/L (ref 3.5–5.3)
Sodium: 143 mmol/L (ref 135–146)

## 2015-06-27 ENCOUNTER — Encounter (HOSPITAL_COMMUNITY): Payer: Self-pay | Admitting: *Deleted

## 2015-06-27 ENCOUNTER — Encounter: Payer: Self-pay | Admitting: *Deleted

## 2015-06-27 ENCOUNTER — Encounter (HOSPITAL_COMMUNITY)
Admission: RE | Disposition: A | Discharge status: Home / Self Care | Payer: Self-pay | Source: Ambulatory Visit | Attending: Cardiovascular Disease

## 2015-06-27 ENCOUNTER — Ambulatory Visit (HOSPITAL_COMMUNITY)
Admission: RE | Admit: 2015-06-27 | Discharge: 2015-06-28 | Disposition: A | Discharge status: Home / Self Care | Payer: Medicare Other | Source: Ambulatory Visit | Attending: Cardiovascular Disease | Admitting: Cardiovascular Disease

## 2015-06-27 DIAGNOSIS — K219 Gastro-esophageal reflux disease without esophagitis: Secondary | ICD-10-CM | POA: Insufficient documentation

## 2015-06-27 DIAGNOSIS — I739 Peripheral vascular disease, unspecified: Secondary | ICD-10-CM

## 2015-06-27 DIAGNOSIS — Z87891 Personal history of nicotine dependence: Secondary | ICD-10-CM | POA: Insufficient documentation

## 2015-06-27 DIAGNOSIS — Z7982 Long term (current) use of aspirin: Secondary | ICD-10-CM | POA: Diagnosis not present

## 2015-06-27 DIAGNOSIS — E782 Mixed hyperlipidemia: Secondary | ICD-10-CM | POA: Diagnosis not present

## 2015-06-27 DIAGNOSIS — I252 Old myocardial infarction: Secondary | ICD-10-CM | POA: Diagnosis not present

## 2015-06-27 DIAGNOSIS — E039 Hypothyroidism, unspecified: Secondary | ICD-10-CM | POA: Diagnosis not present

## 2015-06-27 DIAGNOSIS — I70212 Atherosclerosis of native arteries of extremities with intermittent claudication, left leg: Secondary | ICD-10-CM | POA: Diagnosis not present

## 2015-06-27 DIAGNOSIS — Z7902 Long term (current) use of antithrombotics/antiplatelets: Secondary | ICD-10-CM | POA: Diagnosis not present

## 2015-06-27 DIAGNOSIS — Z79899 Other long term (current) drug therapy: Secondary | ICD-10-CM

## 2015-06-27 DIAGNOSIS — I714 Abdominal aortic aneurysm, without rupture: Secondary | ICD-10-CM | POA: Insufficient documentation

## 2015-06-27 DIAGNOSIS — I10 Essential (primary) hypertension: Secondary | ICD-10-CM | POA: Insufficient documentation

## 2015-06-27 DIAGNOSIS — I2510 Atherosclerotic heart disease of native coronary artery without angina pectoris: Secondary | ICD-10-CM | POA: Insufficient documentation

## 2015-06-27 DIAGNOSIS — Z955 Presence of coronary angioplasty implant and graft: Secondary | ICD-10-CM | POA: Insufficient documentation

## 2015-06-27 DIAGNOSIS — D689 Coagulation defect, unspecified: Secondary | ICD-10-CM

## 2015-06-27 DIAGNOSIS — I70213 Atherosclerosis of native arteries of extremities with intermittent claudication, bilateral legs: Secondary | ICD-10-CM | POA: Insufficient documentation

## 2015-06-27 HISTORY — PX: PERIPHERAL VASCULAR CATHETERIZATION: SHX172C

## 2015-06-27 LAB — POCT ACTIVATED CLOTTING TIME
ACTIVATED CLOTTING TIME: 270 s
ACTIVATED CLOTTING TIME: 202 s
Activated Clotting Time: 227 seconds
Activated Clotting Time: 171 seconds

## 2015-06-27 SURGERY — PERIPHERAL VASCULAR INTERVENTION
Laterality: Bilateral

## 2015-06-27 MED ORDER — HYDRALAZINE HCL 20 MG/ML IJ SOLN: 10.0000 mg | INTRAMUSCULAR | Status: DC | PRN

## 2015-06-27 MED ORDER — FENTANYL CITRATE (PF) 100 MCG/2ML IJ SOLN
INTRAMUSCULAR | Status: DC | PRN
  Administered 2015-06-27 (×2): 25 ug via INTRAVENOUS

## 2015-06-27 MED ORDER — FAMOTIDINE IN NACL 20-0.9 MG/50ML-% IV SOLN
INTRAVENOUS | Status: AC
  Filled 2015-06-27: qty 50

## 2015-06-27 MED ORDER — FAMOTIDINE IN NACL 20-0.9 MG/50ML-% IV SOLN
20.0000 mg | INTRAVENOUS | Status: AC
  Administered 2015-06-27: 20 mg via INTRAVENOUS

## 2015-06-27 MED ORDER — MIDAZOLAM HCL 2 MG/2ML IJ SOLN
INTRAMUSCULAR | Status: DC | PRN
  Administered 2015-06-27: 1 mg via INTRAVENOUS

## 2015-06-27 MED ORDER — ASPIRIN 81 MG PO CHEW
81.0000 mg | CHEWABLE_TABLET | ORAL | Status: AC
  Administered 2015-06-27: 81 mg via ORAL

## 2015-06-27 MED ORDER — MORPHINE SULFATE 2 MG/ML IJ SOLN
INTRAMUSCULAR | Status: AC
  Filled 2015-06-27: qty 1

## 2015-06-27 MED ORDER — HEPARIN SODIUM (PORCINE) 1000 UNIT/ML IJ SOLN
INTRAMUSCULAR | Status: AC
  Filled 2015-06-27: qty 1

## 2015-06-27 MED ORDER — ASPIRIN EC 325 MG PO TBEC: 325.0000 mg | DELAYED_RELEASE_TABLET | Freq: Every day | ORAL | Status: DC

## 2015-06-27 MED ORDER — IODIXANOL 320 MG/ML IV SOLN
INTRAVENOUS | Status: DC | PRN
  Administered 2015-06-27: 155 mL via INTRAVENOUS

## 2015-06-27 MED ORDER — METHYLPREDNISOLONE SODIUM SUCC 125 MG IJ SOLR
60.0000 mg | INTRAMUSCULAR | Status: AC
  Administered 2015-06-27: 60 mg via INTRAVENOUS

## 2015-06-27 MED ORDER — LIDOCAINE HCL (PF) 1 % IJ SOLN
INTRAMUSCULAR | Status: DC | PRN
  Administered 2015-06-27: 30 mL

## 2015-06-27 MED ORDER — METOPROLOL TARTRATE 25 MG PO TABS
25.0000 mg | ORAL_TABLET | Freq: Two times a day (BID) | ORAL | Status: DC
  Administered 2015-06-27 – 2015-06-28 (×2): 25 mg via ORAL
  Filled 2015-06-27 (×2): qty 1

## 2015-06-27 MED ORDER — SODIUM CHLORIDE 0.9 % IV SOLN: INTRAVENOUS | Status: AC

## 2015-06-27 MED ORDER — METHYLPREDNISOLONE SODIUM SUCC 125 MG IJ SOLR
INTRAMUSCULAR | Status: AC
  Filled 2015-06-27: qty 2

## 2015-06-27 MED ORDER — HYDROCODONE-ACETAMINOPHEN 5-325 MG PO TABS: 2.0000 | ORAL_TABLET | ORAL | Status: DC | PRN

## 2015-06-27 MED ORDER — PANTOPRAZOLE SODIUM 40 MG PO TBEC
40.0000 mg | DELAYED_RELEASE_TABLET | Freq: Every day | ORAL | Status: DC
  Administered 2015-06-28: 40 mg via ORAL
  Filled 2015-06-27: qty 1

## 2015-06-27 MED ORDER — DIPHENHYDRAMINE HCL 50 MG/ML IJ SOLN
INTRAMUSCULAR | Status: AC
  Filled 2015-06-27: qty 1

## 2015-06-27 MED ORDER — ROSUVASTATIN CALCIUM 20 MG PO TABS
20.0000 mg | ORAL_TABLET | Freq: Every day | ORAL | Status: DC
  Administered 2015-06-28: 09:00:00 20 mg via ORAL
  Filled 2015-06-27: qty 1

## 2015-06-27 MED ORDER — HEPARIN (PORCINE) IN NACL 2-0.9 UNIT/ML-% IJ SOLN
INTRAMUSCULAR | Status: AC
  Filled 2015-06-27: qty 1000

## 2015-06-27 MED ORDER — SODIUM CHLORIDE 0.9 % WEIGHT BASED INFUSION: 1.0000 mL/kg/h | INTRAVENOUS | Status: DC

## 2015-06-27 MED ORDER — FENTANYL CITRATE (PF) 100 MCG/2ML IJ SOLN
INTRAMUSCULAR | Status: AC
  Filled 2015-06-27: qty 4

## 2015-06-27 MED ORDER — ASPIRIN EC 81 MG PO TBEC: 81.0000 mg | DELAYED_RELEASE_TABLET | Freq: Every day | ORAL | Status: DC

## 2015-06-27 MED ORDER — LEVOTHYROXINE SODIUM 50 MCG PO TABS
50.0000 ug | ORAL_TABLET | Freq: Every day | ORAL | Status: DC
  Administered 2015-06-28: 50 ug via ORAL
  Filled 2015-06-27: qty 1

## 2015-06-27 MED ORDER — MIDAZOLAM HCL 2 MG/2ML IJ SOLN
INTRAMUSCULAR | Status: AC
  Filled 2015-06-27: qty 4

## 2015-06-27 MED ORDER — SODIUM CHLORIDE 0.9 % WEIGHT BASED INFUSION
3.0000 mL/kg/h | INTRAVENOUS | Status: DC
  Administered 2015-06-27: 3 mL/kg/h via INTRAVENOUS

## 2015-06-27 MED ORDER — MORPHINE SULFATE 2 MG/ML IJ SOLN
2.0000 mg | INTRAMUSCULAR | Status: DC | PRN
  Administered 2015-06-27: 2 mg via INTRAVENOUS

## 2015-06-27 MED ORDER — HEPARIN (PORCINE) IN NACL 2-0.9 UNIT/ML-% IJ SOLN
INTRAMUSCULAR | Status: AC
  Filled 2015-06-27: qty 500

## 2015-06-27 MED ORDER — LIDOCAINE HCL (PF) 1 % IJ SOLN
INTRAMUSCULAR | Status: DC | PRN
  Administered 2015-06-27: 30 mL
  Administered 2015-06-27: 13:00:00

## 2015-06-27 MED ORDER — LIDOCAINE HCL (PF) 1 % IJ SOLN
INTRAMUSCULAR | Status: AC
  Filled 2015-06-27: qty 30

## 2015-06-27 MED ORDER — ACETAMINOPHEN 325 MG PO TABS: 650.0000 mg | ORAL_TABLET | ORAL | Status: DC | PRN

## 2015-06-27 MED ORDER — SODIUM CHLORIDE 0.9 % IJ SOLN: 3.0000 mL | INTRAMUSCULAR | Status: DC | PRN

## 2015-06-27 MED ORDER — ONDANSETRON HCL 4 MG/2ML IJ SOLN: 4.0000 mg | Freq: Four times a day (QID) | INTRAMUSCULAR | Status: DC | PRN

## 2015-06-27 MED ORDER — NITROGLYCERIN 0.4 MG SL SUBL: 0.4000 mg | SUBLINGUAL_TABLET | SUBLINGUAL | Status: DC | PRN

## 2015-06-27 MED ORDER — NITROGLYCERIN 1 MG/10 ML FOR IR/CATH LAB
INTRA_ARTERIAL | Status: DC | PRN
Start: 2015-06-27 — End: 2015-06-27
  Administered 2015-06-27: 200 ug

## 2015-06-27 MED ORDER — DIPHENHYDRAMINE HCL 50 MG/ML IJ SOLN
25.0000 mg | INTRAMUSCULAR | Status: AC
  Administered 2015-06-27: 25 mg via INTRAVENOUS

## 2015-06-27 MED ORDER — AMLODIPINE BESYLATE 10 MG PO TABS
10.0000 mg | ORAL_TABLET | Freq: Every day | ORAL | Status: DC
  Administered 2015-06-28: 10 mg via ORAL
  Filled 2015-06-27: qty 1

## 2015-06-27 MED ORDER — ASPIRIN 81 MG PO CHEW
CHEWABLE_TABLET | ORAL | Status: AC
  Filled 2015-06-27: qty 1

## 2015-06-27 MED ORDER — VERAPAMIL HCL 2.5 MG/ML IV SOLN
INTRAVENOUS | Status: AC
  Filled 2015-06-27: qty 2

## 2015-06-27 MED ORDER — NITROGLYCERIN IN D5W 200-5 MCG/ML-% IV SOLN
INTRAVENOUS | Status: AC
  Filled 2015-06-27: qty 250

## 2015-06-27 MED ORDER — HEPARIN SODIUM (PORCINE) 1000 UNIT/ML IJ SOLN
INTRAMUSCULAR | Status: DC | PRN
  Administered 2015-06-27: 6000 [IU] via INTRAVENOUS
  Administered 2015-06-27: 1000 [IU] via INTRAVENOUS

## 2015-06-27 MED ORDER — TICAGRELOR 90 MG PO TABS
90.0000 mg | ORAL_TABLET | Freq: Two times a day (BID) | ORAL | Status: DC
  Administered 2015-06-27 – 2015-06-28 (×2): 90 mg via ORAL
  Filled 2015-06-27 (×2): qty 1

## 2015-06-27 SURGICAL SUPPLY — 28 items
BALLN ARMADA 10X20X80 (BALLOONS) ×3
BALLN ARMADA 4X40X80 (BALLOONS) ×3
BALLN ARMADA 6X40X80 (BALLOONS) ×3
BALLOON ARMADA 10X20X80 (BALLOONS) ×1 IMPLANT
BALLOON ARMADA 4X40X80 (BALLOONS) ×1 IMPLANT
BALLOON ARMADA 6X40X80 (BALLOONS) ×1 IMPLANT
CATH ANGIO 5F BER2 65CM (CATHETERS) ×2 IMPLANT
CATH ANGIO 5F PIGTAIL 65CM (CATHETERS) ×2 IMPLANT
CATH CXI SUPP ANG 2.6FR 150CM (MICROCATHETER) ×2 IMPLANT
CATH STRAIGHT 5FR 65CM (CATHETERS) ×2 IMPLANT
DIAMONDBACK SOLID OAS 2.0MM (CATHETERS) ×3
KIT ENCORE 26 ADVANTAGE (KITS) ×4 IMPLANT
KIT PV (KITS) ×3 IMPLANT
NITREX .014 300CM (WIRE) ×2 IMPLANT
SHEATH BRITE TIP 7FR 35CM (SHEATH) ×4 IMPLANT
SHEATH PINNACLE 7F 10CM (SHEATH) ×4 IMPLANT
STENT ABSOLUTE PRO 8X40X135 (Permanent Stent) ×2 IMPLANT
STENT ICAST 7X38X120 (Permanent Stent) ×2 IMPLANT
STENT ICAST 8X38X120 (Permanent Stent) ×2 IMPLANT
SYR MEDRAD MARK V 150ML (SYRINGE) ×3 IMPLANT
SYSTEM DIMNDBCK SLD OAS 2.0MM (CATHETERS) ×1 IMPLANT
TAPE RADIOPAQUE TURBO (MISCELLANEOUS) ×4 IMPLANT
TRANSDUCER W/STOPCOCK (MISCELLANEOUS) ×3 IMPLANT
TRAY PV CATH (CUSTOM PROCEDURE TRAY) ×3 IMPLANT
WIRE HI TORQ VERSACORE J 260CM (WIRE) ×2 IMPLANT
WIRE HITORQ VERSACORE ST 145CM (WIRE) ×4 IMPLANT
WIRE VERSACORE LOC 115CM (WIRE) ×2 IMPLANT
WIRE VIPER WIRECTO 0.014 (WIRE) ×2 IMPLANT

## 2015-06-27 NOTE — Interval H&P Note (Signed)
History and Physical Interval Note:  06/27/2015 10:40 AM  Jessica Taylor  has presented today for surgery, with the diagnosis of claudication  The various methods of treatment have been discussed with the patient and family. After consideration of risks, benefits and other options for treatment, the patient has consented to  Procedure(s): Lower Extremity Angiography (N/A) as a surgical intervention .  The patient's history has been reviewed, patient examined, no change in status, stable for surgery.  I have reviewed the patient's chart and labs.  Questions were answered to the patient's satisfaction.     Quay Burow

## 2015-06-27 NOTE — Progress Notes (Signed)
Site area: Left groin a 7 french arterial sheath was removed  Site Prior to Removal:  Level 0  Pressure Applied For 20 MINUTES    Minutes Beginning at 1620p  Manual:   Yes.    Patient Status During Pull:  stable  Post Pull Groin Site:  Level 0  Post Pull Instructions Given:  Yes.    Post Pull Pulses Present:  Yes.    Dressing Applied:  Yes.    Comments:  VS remain stable during sheath pull.  Pt denies any discomfort at site at this time.

## 2015-06-27 NOTE — Progress Notes (Addendum)
Site area: right groin a 7 french arterial  sheath was removed  Site Prior to Removal:  Level 0  Pressure Applied For 20 MINUTES    Minutes Beginning at 1650p  Manual:   Yes.    Patient Status During Pull:  stable  Post Pull Groin Site:  Level 0  Post Pull Instructions Given:  Yes.    Post Pull Pulses Present:  Yes.    Dressing Applied:  Yes.    Comments:  VS remain stable during sheath pull Pt complain of discomfort during sheath pull.  Morphine 2 mg given and was effective per pt.

## 2015-06-27 NOTE — H&P (View-Only) (Signed)
Jessica Taylor has a small abdominal aortic aneurysm and high-grade bilateral calcified ostial iliac disease. She has had a recent coronary intervention and is on dual antiplatelets therapy and has had a coronary intervention in the recent past. I performed angiography on her 05/13/15 demonstrating her anatomy however I was somewhat hesitant to proceed with orbital atherectomy because of fear of embolization because affected diffusely diseased aorta. I ultimately referred her to Dr. Trula Slade  for consideration of aortobifemoral bypass grafting however hhe thought she was not a candidate because of her Ancef therapy to perform an open procedure and did not think she was a candidate for an endoluminal stent graft. Because she was significantly symptomatic he referred her back to me for reconsideration of endovascular therapy. I discussed the procedure with the patient and her husband. We will proceed with attempt at orbital atherectomy making careful attention not to pass the burr into the distal aorta and covered stenting using ICast stents of the iliac bifurcation.   Jessica Taylor, M.D., [place], [facility], [place] [address] [place], [place]  [address]  [phone] 06/25/2015 3:04 PM

## 2015-06-27 NOTE — Interval H&P Note (Signed)
History and Physical Interval Note:  06/27/2015 12:57 PM  Jessica Taylor  has presented today for surgery, with the diagnosis of claudication  The various methods of treatment have been discussed with the patient and family. After consideration of risks, benefits and other options for treatment, the patient has consented to  Procedure(s): Lower Extremity Angiography (N/A) as a surgical intervention .  The patient's history has been reviewed, patient examined, no change in status, stable for surgery.  I have reviewed the patient's chart and labs.  Questions were answered to the patient's satisfaction.     Quay Burow

## 2015-06-27 NOTE — H&P (View-Only) (Signed)
Which you like me to percutaneously fix her iliac bifurcation

## 2015-06-27 NOTE — H&P (Signed)
Marland Kitchen HPI:The patient was brought to the second floor [facility] cath lab in the the postabsorptive state. She was premedicated with Valium 5 mg by mouth, IV Versed and fentanyl. Her right groin was prepped and shaved in usual sterile fashion. Xylocaine 1% was used for local anesthesia. A 7 French sheath was inserted into the right common femoral artery using standard Seldinger technique. A 5 French pigtail catheter was placed in the mid abdominal aorta. Mid stream Abdominal aortography, bilateral iliac angiography and bifemoral runoff was performed using bolus chase digital subtraction step table technique. Visipaque dye was used for the entirety of the case. Retrograde aortic pressure was monitored during the case  Problem List: Patient Active Problem List   Diagnosis Date Noted  . Acute renal insufficiency post PCI- SCr1.34 03/08/2015  . STEMI- urgent CFX PCI 03/06/15 03/06/2015  . 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 Hypokalemia 03/06/2015  . Hyperglycemia 03/06/2015  . PAD (peripheral artery disease) 03/06/2015  . Aortoiliac occlusive disease   . Elevated troponin 02/10/2015  . GERD (gastroesophageal reflux disease) 02/08/2015  . CAD S/P prior PCI to LAD and RCA 02/08/2015  . Hypothyroidism 02/08/2015  . Hypoxia 02/08/2015  . Status post right partial knee replacement   . Primary osteoarthritis of right knee 02/04/2015  . 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 Atherosclerosis of native coronary artery of native heart without angina pectoris 06/06/2014  . Mixed hyperlipidemia 06/06/2014  . Essential hypertension 06/06/2014    PMHx:  Past Medical History  Diagnosis Date  . Acid reflux   . Hypothyroidism   . MI, old 33  . Hypercholesteremia   . Vertigo     when lays on left side.  . Cancer     basal cell on nose  . Anemia   . Coronary artery disease   . Hypertension   . Arthritis     knees , R shoulder - tx /w injection - 11/2014  .  Tobacco abuse   . Abdominal aortic aneurysm   . Peripheral arterial disease     hhigh-grade ostial bilateral calcified iliac stenosis with claudication   Past Surgical History  Procedure Laterality Date  . Coronary stent placement    . Cardiac catheterization  5 stents  . Tubal ligation    . Cataract extraction w/phaco Left 05/24/2014    Procedure: CATARACT EXTRACTION PHACO AND INTRAOCULAR LENS PLACEMENT (IOC);  Surgeon: Tonny Branch, MD;  Location: [facility];  Service: Ophthalmology;  Laterality: Left;  CDE:  9.30  . Cataract extraction w/phaco Right 06/18/2014    Procedure: CATARACT EXTRACTION PHACO AND INTRAOCULAR LENS PLACEMENT RIGHT EYE CDE=10.84;  Surgeon: Tonny Branch, MD;  Location: [facility];  Service: Ophthalmology;  Laterality: Right;  . Eye surgery    . Tonsillectomy      age 74  . Partial knee arthroplasty Right 02/04/2015    Procedure: UNICOMPARTMENTAL KNEE;  Surgeon: Dorna Leitz, MD;  Location: [facility];  Service: Orthopedics;  Laterality: Right;  . Left heart catheterization with coronary angiogram N/A 03/06/2015    Procedure: LEFT HEART CATHETERIZATION WITH CORONARY ANGIOGRAM;  Surgeon: Lorretta Harp, MD;  Location: [facility] CATH LAB;  Service: Cardiovascular;  Laterality: N/A;  . Coronary stent placement  03/06/15    CFX DES  . Peripheral vascular catheterization Bilateral 05/13/2015    Procedure: Lower Extremity Angiography;  Surgeon: Lorretta Harp, MD;  Location: [facility] CV LAB;  Service: Cardiovascular;  Laterality: Bilateral;  . Peripheral vascular catheterization N/A 05/13/2015    Procedure: Abdominal Aortogram;  Surgeon: Lorretta Harp, MD;  Location: [facility] [facility] CV LAB;  Service: Cardiovascular;  Laterality: N/A;    FAMHx: Family History  Problem Relation Age of Onset  . Cancer Mother 86  . Cancer Father 15  . Heart attack Neg Hx   . Stroke Maternal Grandfather   . Hypertension Maternal Grandmother   . Hypertension Son     SOCHx:  reports that she quit smoking about  16 years ago. Her smoking use included Cigarettes. She has a 63 pack-year smoking history. She has never used smokeless tobacco. She reports that she does not drink alcohol or use illicit drugs.  ALLERGIES: Allergies  Allergen Reactions  . Shrimp [Shellfish Allergy] Nausea And Vomiting    Throws up violently    ROS: Pertinent items are noted in HPI.  HOME MEDICATIONS: Facility-administered medications prior to admission  Medication Dose Route Frequency Provider Last Rate Last Dose  . diphenhydrAMINE (BENADRYL) injection 25 mg  25 mg Intravenous Once Lorretta Harp, MD      . famotidine (PEPCID) 20 mg in sodium chloride 0.9 % 50 mL IVPB  20 mg Intravenous Once Lorretta Harp, MD      . methylPREDNISolone sodium succinate (SOLU-MEDROL) 125 mg/2 mL injection 60 mg  60 mg Intramuscular Once Lorretta Harp, MD       Prescriptions prior to admission  Medication Sig Dispense Refill Last Dose  . amLODipine (NORVASC) 10 MG tablet Take 10 mg by mouth daily before breakfast.    Taking  . aspirin EC 81 MG tablet Take 81 mg by mouth at bedtime.   Taking  . esomeprazole (NEXIUM) 40 MG capsule TAKE 1 CAPSULE BY MOUTH DAILY AT NOON. 30 capsule 5 Taking  . HYDROcodone-acetaminophen (NORCO/VICODIN) 5-325 MG per tablet Take 2 tablets by mouth every 4 (four) hours as needed. (Patient taking differently: Take 2 tablets by mouth every 4 (four) hours as needed (pain). ) 10 tablet 0 Taking  . Ketotifen Fumarate (THERA TEARS ALLERGY OP) Apply 1 drop to eye 2 (two) times daily as needed (dry eyes).   Taking  . levothyroxine (SYNTHROID, LEVOTHROID) 50 MCG tablet Take 50 mcg by mouth daily before breakfast.   Taking  . metoprolol tartrate (LOPRESSOR) 25 MG tablet Take 1 tablet (25 mg total) by mouth 2 (two) times daily. 60 tablet 6 Taking  . nitroGLYCERIN (NITROSTAT) 0.4 MG SL tablet Place 1 tablet (0.4 mg total) under the tongue every 5 (five) minutes x 3 doses as needed for chest pain. 25 tablet 3 Taking  .  rosuvastatin (CRESTOR) 20 MG tablet Take 1 tablet (20 mg total) by mouth daily. 30 tablet 5 Taking  . ticagrelor (BRILINTA) 90 MG TABS tablet Take 1 tablet (90 mg total) by mouth 2 (two) times daily. 60 tablet [place]: I have reviewed the patient's current medications.  VITALS: There were no vitals taken for this visit.  INPUT/OUTPUT        PHYSICAL EXAM: General appearance: alert and no distress Neck: no adenopathy, no carotid bruit, no JVD, supple, symmetrical, trachea midline and thyroid not enlarged, symmetric, no tenderness/mass/nodules Lungs: clear to auscultation bilaterally Heart: regular rate and rhythm, S1, S2 normal, no murmur, click, rub or gallop Extremities: extremities normal, atraumatic, no cyanosis or edema  LABS:  BMP  Recent Labs  03/07/15 0415 03/08/15 0355 03/19/15 1348 05/13/15 0806 06/25/15 1525  NA 142 140 139 136 143  K 4.6 4.0 3.6 4.8 4.6  CL 107 107 104 105 106  CO2 27 21 29 23 28   GLUCOSE 111* 108* 105* 103* 85  BUN 14 22 15  25* 20  CREATININE 1.29* 1.34* 1.07 1.36* 1.01*  CALCIUM 9.9 9.8 10.0 9.6 9.7  GFRNONAA 39* 37*  --  37*  --   GFRAA 45* 43*  --  43*  --     CBC  Recent Labs Lab 06/25/15 1525  WBC 7.1  RBC 4.67  HGB 12.9  HCT 40.4  PLT 218  MCV 86.5    HEMOGLOBIN A1C Lab Results  Component Value Date   HGBA1C 5.9* 03/06/2015   MPG 123 03/06/2015    Cardiac Panel (last 3 results)  Recent Labs  03/06/15 0922 03/06/15 1300 03/06/15 1932  TROPONINI 7.51* 41.32* 35.30*    BNP (last 3 results) No results for input(s): PROBNP in the last 8760 hours.  TSH  Recent Labs  02/09/15 0521 03/06/15 1035  TSH 1.921 0.844    CHOLESTEROL  Recent Labs  02/09/15 0521 03/07/15 0415  CHOL 138 166    Hepatic Function Panel  Recent Labs  12/28/14 1531 02/08/15 1644 03/06/15 0415  PROT 6.9 6.4 6.3  ALBUMIN 4.5 3.3* 3.9  AST 24 15 20   ALT 23 11 19   ALKPHOS 86 67 82  BILITOT  0.7 0.8 0.4    IMAGING: No results found.  IMPRESSION: 1. Severe aortoiliac disease. Small abdominal aortic aneurysm with highly calcified ostial iliac stenosis. She has lifestyle limiting claudication.  I have referred her to Dr. Trula Slade feels that she would require aortobifemoral bypass grafting but unfortunately she is unable to stop her Brilenta because of her recent coronary intervention. The best option is attempt at orbital atherectomy with stenting of her iliac ostia.   RECOMMENDATION: 1. Diamondback orbital rotational atherectomy, PTCA and coverage stenting of bilateral iliac ostia.  Time Spent Directly with Patient:   Quay Burow 06/27/2015, 10:43 AM

## 2015-06-28 ENCOUNTER — Encounter (HOSPITAL_COMMUNITY): Payer: Self-pay | Admitting: Cardiovascular Disease

## 2015-06-28 ENCOUNTER — Other Ambulatory Visit: Payer: Self-pay | Admitting: Physician Assistant

## 2015-06-28 DIAGNOSIS — I739 Peripheral vascular disease, unspecified: Secondary | ICD-10-CM

## 2015-06-28 DIAGNOSIS — I70213 Atherosclerosis of native arteries of extremities with intermittent claudication, bilateral legs: Secondary | ICD-10-CM | POA: Diagnosis not present

## 2015-06-28 LAB — CBC
HCT: 38.2 % (ref 36.0–46.0)
HEMOGLOBIN: 12.2 g/dL (ref 12.0–15.0)
MCH: 28 pg (ref 26.0–34.0)
MCHC: 31.9 g/dL (ref 30.0–36.0)
MCV: 87.6 fL (ref 78.0–100.0)
Platelets: 197 10*3/uL (ref 150–400)
RBC: 4.36 MIL/uL (ref 3.87–5.11)
RDW: 15 % (ref 11.5–15.5)
WBC: 8.4 10*3/uL (ref 4.0–10.5)

## 2015-06-28 LAB — BASIC METABOLIC PANEL
ANION GAP: 8 (ref 5–15)
BUN: 23 mg/dL — ABNORMAL HIGH (ref 6–20)
CHLORIDE: 109 mmol/L (ref 101–111)
CO2: 22 mmol/L (ref 22–32)
CREATININE: 1.15 mg/dL — AB (ref 0.44–1.00)
Calcium: 9.4 mg/dL (ref 8.9–10.3)
GFR calc Af Amer: 52 mL/min — ABNORMAL LOW (ref >60)
GFR calc non Af Amer: 45 mL/min — ABNORMAL LOW (ref >60)
Glucose, Bld: 153 mg/dL — ABNORMAL HIGH (ref 65–99)
Potassium: 4.6 mmol/L (ref 3.5–5.1)
SODIUM: 139 mmol/L (ref 135–145)

## 2015-06-28 MED ORDER — ASPIRIN EC 81 MG PO TBEC
81.0000 mg | DELAYED_RELEASE_TABLET | Freq: Every day | ORAL | Status: DC
  Administered 2015-06-28: 09:00:00 81 mg via ORAL
  Filled 2015-06-28: qty 1

## 2015-06-28 NOTE — Progress Notes (Signed)
CM spoke with pt , pt concern with being in "Donut hole" regarding insurance, states have limited funds for medication. [place] application provided to pt to help with Brilinta cost and information on Medicare low income subsidy program given to help aid or supplement  with medication cost during gap. Pt appreciated information. No other needs identified @ present time per CM. Whitman Hero RN,BSN,CM [phone]

## 2015-06-28 NOTE — Discharge Summary (Signed)
CARDIOLOGY DISCHARGE SUMMARY   Patient ID: Jessica Taylor MRN: [id number] DOB/AGE: July 13, 1941 74 y.o.  Admit date: 06/27/2015 Discharge date: 06/28/2015  PCP: Jessica Kroner, MD Primary Cardiologist: Lorretta Harp MD Renae Gloss  Primary Discharge Diagnosis:  Peripheral Artery Disease Secondary Discharge Diagnosis: Claudication  Consults: None  Procedures: Abdominal aortogram/bilateral iliaic, Diamondback orbital rotational artherectomy of bilateral iliac arteries, and ICast stenting bilateral iliac arteries  Hospital Course: Jessica Taylor is a 73 y.o. female with a history of HLD, CAD (STEMI - 03/06/15, circumflex PCI), HTN, and Hypothyroidism who presented on 06/27/15 for arthrectomy of bilateral calcified iliac ostia.   A peripheral vascular catheterization on 05/13/15 showed high-grade calcified ostial common iliac artery stenoses. It was determined by Dr. Trula Slade and Dr. Gwenlyn Found it was best to proceed with an orbital arthrectomy of the bilateral iliac arteries.  The patient presented on 06/27/15 for the procedure. Risks and benefits of the procedure were discussed in detail with the patient. A diamondback orbital rotational arthrectomy of the bilateral calcified iliac ostia followed by kissing ICaststenting was performed. There were no reported complications with the procedure. Dr. Gwenlyn Found recommended therapy with Aspirin and Brilinta following the procedure, which she is tolerating well.   The patient was last seen and examined by Dr. Claiborne Billings and deemed stable for discharge. She denies any shortness or breath, chest pain, palpitations, abdominal pain, or catheter site pain. A Brilinta assistance form was submitted for the patient. She claims to have over 2 weeks worth of medication at home. She was educated on the importance of her medication therapy and said she would notify the office if she does not get approved for the medication and cannot afford it, so we can switch her to  a different medication.   Labs:   Lab Results  Component Value Date   WBC 8.4 06/28/2015   HGB 12.2 06/28/2015   HCT 38.2 06/28/2015   MCV 87.6 06/28/2015   PLT 197 06/28/2015     Recent Labs Lab 06/28/15 0312  NA 139  K 4.6  CL 109  CO2 22  BUN 23*  CREATININE 1.15*  CALCIUM 9.4  GLUCOSE 153*     Recent Labs  06/25/15 1525  INR 1.00      Peripheral Vascular Catherization: 06/27/15  The patient was brought to the second floor [facility] cath lab in the the postabsorptive state. She was premedicated with Valium 5 mg by mouth, IV Versed and fentanyl. Her right and left groins were prepped and shaved in usual sterile fashion. Xylocaine 1% was used for local anesthesia. A 7 French Bright-tipped sheath was inserted into the right and left common femoral arteries using standard Seldinger technique.   The patient received a total of 7000 units of heparin intravenously with ending ACT of 202. Total contrast and minister to the patient was 155 mL. Was able to cross the right common iliac artery ostia with a Viper wire and performed orbital atherectomy with a 2 mm burr up to 120,000 rpm's. I then performed the equivalent procedure on the left side. I dilated both iliac ostia with an 4 mm x 4 cm balloon. I then placed in the telemeter by 38 mm long ICast covered stent at the origin of the right common iliac artery and a 7 mm x 30 mm long ICAST covered stent at the origin of the left common iliac. Artery. I did a pullback gradient across the distal left common iliac artery using a straight  catheter and intra-arterial nitroglycerin demonstrating a 40 mm gradient and stented this with an 8 mm x 40 mm long Abbott absolute Pro self expanding stent dilating with a 6 mm right 40 mm balloon. The final angiogram result was reduction of an 80% right and 99% left calcified ostial common iliac artery stenoses to 0% residual. The 7 French Bright-tip sheaths were then exchanged for a short 7  [place] sheath. The patient left the lab in stable condition.  IMPRESSION: successful diamondback orbital rotational arthrectomy of bilateral calcified iliac ostia followed by kissing ICast Stenting. The patient is already on total type of therapy with aspirin and Brilenta. She tolerated the procedure well. The sheaths will be removed once the ACT falls below 170 pressure held on each groin. She'll be hydrated overnight, discharged home in the morning. We will get lower extremity arterial Doppler studies in our [facility] line office of her aorto iliac segment next week and I will see her back in 2-3 weeks for follow-up.   FOLLOW UP PLANS AND APPOINTMENTS Allergies  Allergen Reactions  . Shrimp [Shellfish Allergy] Nausea And Vomiting    Throws up violently     Medication List    TAKE these medications        amLODipine 10 MG tablet  Commonly known as:  NORVASC  Take 10 mg by mouth daily before breakfast.     aspirin EC 81 MG tablet  Take 81 mg by mouth at bedtime.     esomeprazole 40 MG capsule  Commonly known as:  NEXIUM  TAKE 1 CAPSULE BY MOUTH DAILY AT NOON.     HYDROcodone-acetaminophen 5-325 MG per tablet  Commonly known as:  NORCO/VICODIN  Take 2 tablets by mouth every 4 (four) hours as needed.     levothyroxine 50 MCG tablet  Commonly known as:  SYNTHROID, LEVOTHROID  Take 50 mcg by mouth daily before breakfast.     metoprolol tartrate 25 MG tablet  Commonly known as:  LOPRESSOR  Take 1 tablet (25 mg total) by mouth 2 (two) times daily.     nitroGLYCERIN 0.4 MG SL tablet  Commonly known as:  NITROSTAT  Place 1 tablet (0.4 mg total) under the tongue every 5 (five) minutes x 3 doses as needed for chest pain.     rosuvastatin 20 MG tablet  Commonly known as:  CRESTOR  Take 1 tablet (20 mg total) by mouth daily.     THERA TEARS ALLERGY OP  Apply 1 drop to eye 2 (two) times daily as needed (dry eyes).     ticagrelor 90 MG Tabs tablet  Commonly known as:  BRILINTA    Take 1 tablet (90 mg total) by mouth 2 (two) times daily.        Discharge Instructions    Diet - low sodium heart healthy    Complete by:  As directed      Increase activity slowly    Complete by:  As directed           Follow-up Information    Follow up with [facility] On 07/05/2015.   Why:  2:30 PM - Doppler Study   Contact information:   [address] [place] [id number] [phone]      Follow up with Quay Burow, MD On 07/23/2015.   Specialties:  Cardiology, Radiology   Why:  10:45 AM - Follow- Up for Doppler Study   Contact information:   [address] [place] [address] [phone]  BRING ALL MEDICATIONS WITH YOU TO FOLLOW UP APPOINTMENTS  Time spent with patient to include physician time: > 30 min Signed: Rosaria Ferries, PA-C 06/28/2015, 10:19 AM Co-Sign MD

## 2015-06-28 NOTE — Progress Notes (Signed)
Patient Name: Jessica Taylor Date of Encounter: 06/28/2015  Active Problems:   PAD (peripheral artery disease)   Claudication   Primary Cardiologist: Lorretta Harp MD Renae Gloss Patient Profile: 74 yo female with PMH of HLD, CAD (STEMI - 03/06/15, circumflex PCI), HTN, and Hypothyroidism who presented on 06/27/15 for arthrectomy of bilateral calcified iliac ostia followed by kissing ICast stenting.  SUBJECTIVE: Feeling well overnight and this morning. Denies any tenderness or bleeding at catheter site. Denies any chest pain or shortness of breath. Eating well. Has been out of bed several times without any complications.  OBJECTIVE Filed Vitals:   06/27/15 2000 06/27/15 2127 06/28/15 0009 06/28/15 0409  BP: 135/52 133/63 143/65 118/53  Pulse: 77 85 78 72  Temp:   97.8 F (36.6 C) 97.6 F (36.4 C)  TempSrc:   Oral Oral  Resp: 22  16 19   Height:      Weight:   161 lb 13.1 oz (73.4 kg)   SpO2: 97%   95%    Intake/Output Summary (Last 24 hours) at 06/28/15 0709 Last data filed at 06/28/15 0410  Gross per 24 hour  Intake   1446 ml  Output    300 ml  Net   1146 ml   Filed Weights   06/27/15 1048 06/28/15 0009  Weight: 160 lb (72.576 kg) 161 lb 13.1 oz (73.4 kg)    PHYSICAL EXAM General: Well developed, well nourished, female in no acute distress. Head: Normocephalic, atraumatic.  Neck: Supple without bruits, no JVD. Lungs:  Resp regular and unlabored, CTA without wheezing or rales. Heart: RRR, S1, S2, no S3, S4, or murmur; no rub. Abdomen: Soft, non-tender, non-distended, BS + x 4.  Extremities: No clubbing, no cyanosis, no edema. Catheter sites at groin soft, non-tender, and without bleeding.  Neuro: Alert and oriented X 3. Moves all extremities spontaneously. Psych: Normal affect.  LABS: CBC: Recent Labs  06/25/15 1525 06/28/15 0312  WBC 7.1 8.4  HGB 12.9 12.2  HCT 40.4 38.2  MCV 86.5 87.6  PLT 218 197   INR: Recent Labs  06/25/15 1525    INR [id number]   Basic Metabolic Panel: Recent Labs  06/25/15 1525 06/28/15 0312  NA 143 139  K 4.6 4.6  CL 106 109  CO2 28 22  GLUCOSE 85 153*  BUN 20 23*  CREATININE 1.01* 1.15*  CALCIUM 9.7 9.4    TELE:  NSR without atopic events; Rate in 60-70's.         Peripheral Vascular Catherization: 06/27/15  The patient was brought to the second floor [facility] lab in the the postabsorptive state. She was premedicated with Valium 5 mg by mouth, IV Versed and fentanyl. Her right and left groins were prepped and shaved in usual sterile fashion. Xylocaine 1% was used for local anesthesia. A 7 French Bright-tipped sheath was inserted into the right and left common femoral arteries using standard Seldinger technique.   The patient received a total of 7000 units of heparin intravenously with ending ACT of 202. Total contrast and minister to the patient was 155 mL. Was able to cross the right common iliac artery ostia with a Viper wire and performed orbital atherectomy with a 2 mm burr up to 120,000 rpm's. I then performed the equivalent procedure on the left side. I dilated both iliac ostia with an 4 mm x 4 cm balloon. I then placed in the telemeter by 38 mm long ICast covered stent at  the origin of the right common iliac artery and a 7 mm x 30 mm long ICAST covered stent at the origin of the left common iliac. Artery. I did a pullback gradient across the distal left common iliac artery using a straight catheter and intra-arterial nitroglycerin demonstrating a 40 mm gradient and stented this with an 8 mm x 40 mm long Abbott absolute Pro self expanding stent dilating with a 6 mm right 40 mm balloon. The final angiogram result was reduction of an 80% right and 99% left calcified ostial common iliac artery stenoses to 0% residual. The 7 French Bright-tip sheaths were then exchanged for a short 7 [place] sheath. The patient left the lab in stable condition.  IMPRESSION: successful diamondback  orbital rotational arthrectomy of bilateral calcified iliac ostia followed by kissing ICast Stenting. The patient is already on total type of therapy with aspirin and Brilenta. She tolerated the procedure well. The sheaths will be removed once the ACT falls below 170 pressure held on each groin. She'll be hydrated overnight, discharged home in the morning. We will get lower extremity arterial Doppler studies in our [facility] line office of her aorto iliac segment next week and I will see her back in 2-3 weeks for follow-up.   Current Medications:  . amLODipine  10 mg Oral QAC breakfast  . aspirin EC  81 mg Oral Daily  . levothyroxine  50 mcg Oral QAC breakfast  . metoprolol tartrate  25 mg Oral BID  . pantoprazole  40 mg Oral Daily  . rosuvastatin  20 mg Oral Daily  . ticagrelor  90 mg Oral BID      ASSESSMENT AND PLAN:  1. 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 - Orbital artherectomy of bilateral calcified ostia with kissing ICast stenting on 06/27/15.  - Continue aspirin and Brilinta. - Follow-up at [facility] office next week for arterial doppler studies of her aortic iliac segment next week per Dr. Gwenlyn Found. Office visit follow-up in 2-3 weeks.  2. CAD - Previous STEMI on 03/06/15 (circumflex PCI). - Continue dual anti-platelet therapy with aspirin and Brilinta. - Continue statin and BB.  3. HLD  - Continue statin.  4. HTN - Continue BB and CCB.   Signed, Rosaria Ferries , PA-C 7:09 AM 06/28/2015    Patient seen and examined. Agree with assessment and plan. No chest pain; bilateral groin sites stable. DC today with f/u dopplers per Dr. Gwenlyn Found.   Troy Sine, MD, [facility] 06/28/2015 8:38 AM

## 2015-07-03 ENCOUNTER — Other Ambulatory Visit: Payer: Self-pay | Admitting: Physician Assistant

## 2015-07-03 ENCOUNTER — Encounter (HOSPITAL_COMMUNITY): Payer: Self-pay | Admitting: Cardiovascular Disease

## 2015-07-03 DIAGNOSIS — I739 Peripheral vascular disease, unspecified: Secondary | ICD-10-CM

## 2015-07-05 ENCOUNTER — Ambulatory Visit (HOSPITAL_COMMUNITY)
Admission: RE | Admit: 2015-07-05 | Discharge: 2015-07-05 | Disposition: A | Discharge status: Home / Self Care | Payer: Medicare Other | Source: Ambulatory Visit | Attending: Cardiovascular Disease | Admitting: Cardiovascular Disease

## 2015-07-05 ENCOUNTER — Ambulatory Visit (HOSPITAL_BASED_OUTPATIENT_CLINIC_OR_DEPARTMENT_OTHER)
Admission: RE | Admit: 2015-07-05 | Discharge: 2015-07-05 | Disposition: A | Discharge status: Home / Self Care | Payer: Medicare Other | Source: Ambulatory Visit

## 2015-07-05 DIAGNOSIS — I739 Peripheral vascular disease, unspecified: Secondary | ICD-10-CM

## 2015-07-08 ENCOUNTER — Encounter (HOSPITAL_COMMUNITY): Payer: Self-pay | Admitting: Cardiovascular Disease

## 2015-07-09 ENCOUNTER — Telehealth: Payer: Self-pay | Admitting: Cardiovascular Disease

## 2015-07-09 NOTE — Telephone Encounter (Signed)
Since stents , ABI on 07/05/15  Increased swelling of feet and ankles especially the left  Had some chest pains last night; eased off with nitro  No shortness of breath  Will route to Dr. Gwenlyn Found to see if if would like to change her plan of care

## 2015-07-09 NOTE — Telephone Encounter (Signed)
Jessica Taylor is calling because her mom had two stents placed a couple a weeks ago, and she had a ABI on 07/05/15 and since then her feet and ankles have been swollen. Also she has been having some chest pains on last night   In which she has taken nitro for it . Please call .  Thanks

## 2015-07-09 NOTE — Telephone Encounter (Signed)
Return office visit with me

## 2015-07-17 ENCOUNTER — Ambulatory Visit (INDEPENDENT_AMBULATORY_CARE_PROVIDER_SITE_OTHER): Payer: Medicare Other | Admitting: Cardiovascular Disease

## 2015-07-17 ENCOUNTER — Encounter: Payer: Self-pay | Admitting: Cardiovascular Disease

## 2015-07-17 VITALS — BP 132/72 | HR 63 | Ht 64.0 in | Wt 162.9 lb

## 2015-07-17 DIAGNOSIS — I700 Atherosclerosis of aorta: Secondary | ICD-10-CM | POA: Diagnosis not present

## 2015-07-17 DIAGNOSIS — I10 Essential (primary) hypertension: Secondary | ICD-10-CM

## 2015-07-17 DIAGNOSIS — Z9861 Coronary angioplasty status: Secondary | ICD-10-CM | POA: Diagnosis not present

## 2015-07-17 DIAGNOSIS — I2510 Atherosclerotic heart disease of native coronary artery without angina pectoris: Secondary | ICD-10-CM

## 2015-07-17 DIAGNOSIS — I7409 Other arterial embolism and thrombosis of abdominal aorta: Secondary | ICD-10-CM

## 2015-07-17 NOTE — Progress Notes (Signed)
07/17/2015 Jessica Taylor   [birth year]  [id number]  Primary Physician Jessica Kroner, MD Primary Cardiologist: Jessica Harp MD Jessica Taylor   HPI:  Jessica Taylor is a 74 year old married Caucasian female mother of 17, grandmother and 6 grandchildren with a past history of coronary stenting performed by Jessica Taylor., hypertension and hyperlipidemia. She was hospitalized several months ago with hypoxia of unclear etiology. She did have a total knee replacement prior to that admission. CT abdomen was negative for PE. A 2-D echo was normal. A Myoview stress test showed mild inferobasal ischemia and she was deemed stable to be discharged home and treated medically. She's had off-and-on chest pain over the last several days prior to admission. She had chest pain the morning on admission at 2 AM which awakened her from sleep and she was brought to [facility] emergency room where her EKG showed inferolateral ST segment elevation. She was transferred by Jessica Taylor to Jessica Taylor for urgent intervention. I performed cardiac catheterization on her via the right femoral approach revealed an occluded mid circumflex artery which I stented. Her EF by cath was 35% by echo the following day 60%. 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 She does have lifestyle limiting claudication. I referred her to Jessica Taylor for consideration of aortobifemoral bypass grafting however he thought this was high risk especially in light of the fact that she was on [place] for her recently placed coronary stent and referred her back for percutaneous revascularization. Aneurysm measures approximately 3.3 cm. I performed percutaneous intervention on her iliac arteries 06/27/15 performing diamondback orbital rotational atherectomy, PTA and covered stenting using ICast covered stents.her  symptoms completely resolved and her ABIs normalized. She's got 2+ pedal pulses bilaterally.   Current Outpatient Prescriptions  Medication Sig Dispense Refill  . amLODipine (NORVASC) 10 MG tablet Take 10 mg by mouth daily before breakfast.     . aspirin EC 81 MG tablet Take 81 mg by mouth at bedtime.    Marland Kitchen esomeprazole (NEXIUM) 40 MG capsule TAKE 1 CAPSULE BY MOUTH DAILY AT NOON. 30 capsule 5  . HYDROcodone-acetaminophen (NORCO/VICODIN) 5-325 MG per tablet Take 2 tablets by mouth every 4 (four) hours as needed. (Patient taking differently: Take 2 tablets by mouth every 4 (four) hours as needed (pain). ) 10 tablet 0  . Ketotifen Fumarate (THERA TEARS ALLERGY OP) Apply 1 drop to eye 2 (two) times daily as needed (dry eyes).    Marland Kitchen levothyroxine (SYNTHROID, LEVOTHROID) 50 MCG tablet Take 50 mcg by mouth daily before breakfast.    . metoprolol tartrate (LOPRESSOR) 25 MG tablet Take 1 tablet (25 mg total) by mouth 2 (two) times daily. 60 tablet 6  . nitroGLYCERIN (NITROSTAT) 0.4 MG SL tablet Place 1 tablet (0.4 mg total) under the tongue every 5 (five) minutes x 3 doses as needed for chest pain. 25 tablet 3  . rosuvastatin (CRESTOR) 20 MG tablet Take 1 tablet (20 mg total) by mouth daily. 30 tablet 5  . ticagrelor (BRILINTA) 90 MG TABS tablet Take 1 tablet (90 mg total) by mouth 2 (two) times daily. 60 tablet 11   Current Facility-Administered Medications  Medication Dose Route Frequency Provider Last Rate Last Dose  . diphenhydrAMINE (BENADRYL) injection 25 mg  25 mg Intravenous Once Jessica Harp, MD      . famotidine (PEPCID) 20 mg in sodium chloride  0.9 % 50 mL IVPB  20 mg Intravenous Once Jessica Harp, MD      . methylPREDNISolone sodium succinate (SOLU-MEDROL) 125 mg/2 mL injection 60 mg  60 mg Intramuscular Once Jessica Harp, MD        Allergies  Allergen Reactions  . Shrimp [Shellfish Allergy] Nausea And Vomiting    Throws up violently    Social History   Social History  .  Marital Status: Married    Spouse Name: N/A  . Number of Children: N/A  . Years of Education: N/A   Occupational History  . Not on file.   Social History Main Topics  . Smoking status: Former Smoker -- 1.50 packs/day for 42 years    Types: Cigarettes    Quit date: 05/19/1999  . Smokeless tobacco: Never Used  . Alcohol Use: No  . Drug Use: No  . Sexual Activity: Yes    Birth Control/ Protection: Surgical   Other Topics Concern  . Not on file   Social History Narrative     Review of Systems: General: negative for chills, fever, night sweats or weight changes.  Cardiovascular: negative for chest pain, dyspnea on exertion, edema, orthopnea, palpitations, paroxysmal nocturnal dyspnea or shortness of breath Dermatological: negative for rash Respiratory: negative for cough or wheezing Urologic: negative for hematuria Abdominal: negative for nausea, vomiting, diarrhea, bright red blood per rectum, melena, or hematemesis Neurologic: negative for visual changes, syncope, or dizziness All other systems reviewed and are otherwise negative except as noted above.    Blood pressure 132/72, pulse 63, height 5\' 4"  (1.626 m), weight 162 lb 14.4 oz (73.891 kg).  General appearance: alert and no distress Neck: no adenopathy, no carotid bruit, no JVD, supple, symmetrical, trachea midline and thyroid not enlarged, symmetric, no tenderness/mass/nodules Lungs: clear to auscultation bilaterally Heart: regular rate and rhythm, S1, S2 normal, no murmur, click, rub or gallop Extremities: extremities normal, atraumatic, no cyanosis or edema and 2+ pedal pulses bilaterally, trace to 1+ left ankle edema  EKG normal sinus rhythm at 63 without ST or T-wave changes. I personally reviewed this EKG  ASSESSMENT AND PLAN:   Aortoiliac occlusive disease Jessica Taylor returns today for follow-up of her recent endovascular procedure that I performed 06/27/15. She has a a moderate size abdominal aortic aneurysm  with diffuse atherosclerotic changes and as well as high-grade calcified bilateral ostial iliac disease. She underwent diamondback orbital rotational atherectomy, PTA and covered stenting with ICast  stents reducing 80% right and 90% left ostial common iliac artery stenosis to 0% residual. Her symptoms of leg hip back pain have completely resolved. Her ABIs have increased from 0.74 on the right to 1 and from from 1.46 on the left 2.91. She is on aspirin and Brilenta for recent coronary stent. She has 2+ pedal pulses in both feet. We will repeat lower extremity arterial Doppler studies in 6 months and I will see her back after that and follow-up.      Jessica Harp MD FACP,FACC,FAHA, [facility] 07/17/2015 10:58 AM

## 2015-07-17 NOTE — Assessment & Plan Note (Signed)
Jessica Taylor returns today for follow-up of her recent endovascular procedure that I performed 06/27/15. She has a a moderate size abdominal aortic aneurysm with diffuse atherosclerotic changes and as well as high-grade calcified bilateral ostial iliac disease. She underwent diamondback orbital rotational atherectomy, PTA and covered stenting with ICast  stents reducing 80% right and 90% left ostial common iliac artery stenosis to 0% residual. Her symptoms of leg hip back pain have completely resolved. Her ABIs have increased from 0.74 on the right to 1 and from from 1.46 on the left 2.91. She is on aspirin and Brilenta for recent coronary stent. She has 2+ pedal pulses in both feet. We will repeat lower extremity arterial Doppler studies in 6 months and I will see her back after that and follow-up.

## 2015-07-17 NOTE — Patient Instructions (Signed)
Your physician wants you to follow-up in: 6 months with Dr Gwenlyn Found after the ultrasound of your legs.  You will receive a reminder letter in the mail two months in advance. If you don't receive a letter, please call our office to schedule the follow-up appointment.

## 2015-07-18 ENCOUNTER — Telehealth: Payer: Self-pay

## 2015-07-18 DIAGNOSIS — E785 Hyperlipidemia, unspecified: Secondary | ICD-10-CM

## 2015-07-18 MED ORDER — CLOPIDOGREL BISULFATE 75 MG PO TABS: 75.0000 mg | ORAL_TABLET | Freq: Every day | ORAL | Status: DC

## 2015-07-18 MED ORDER — ATORVASTATIN CALCIUM 40 MG PO TABS: 40.0000 mg | ORAL_TABLET | Freq: Every day | ORAL | Status: DC

## 2015-07-18 NOTE — Telephone Encounter (Signed)
Per Dr.Skains, ok for pt to switch from  Crestor to atorvastatin 40mg  qd due to cost fasting lipid and alt in 3 months

## 2015-07-18 NOTE — Telephone Encounter (Signed)
Per Dr.Skains pt should be switched from Brilinta to Plavix 75mg  daily due to cost.

## 2015-07-23 ENCOUNTER — Ambulatory Visit: Payer: Medicare Other | Admitting: Cardiovascular Disease

## 2015-08-01 ENCOUNTER — Telehealth: Payer: Self-pay | Admitting: *Deleted

## 2015-08-01 NOTE — Telephone Encounter (Signed)
-----   Message from Isaiah Serge, NP sent at 08/01/2015  6:58 AM EDT ----- I was seeing pt's husband but Jessica Taylor was having more edema in leg after PV procedure, Jessica Taylor can you schedule an appt with [facility] - [facility], and Dr. Adora Fridge if you have any suggestions please address.  She said edema started after follow up doppler's.  thanks

## 2015-08-01 NOTE — Telephone Encounter (Signed)
I spoke with Ms Dearden.  Appt made for Mickel Baas in the flex clinic 08/02/15.

## 2015-08-02 ENCOUNTER — Encounter: Payer: Self-pay | Admitting: Cardiology

## 2015-08-02 ENCOUNTER — Ambulatory Visit (INDEPENDENT_AMBULATORY_CARE_PROVIDER_SITE_OTHER): Payer: Medicare Other | Admitting: Cardiology

## 2015-08-02 VITALS — BP 115/62 | HR 70 | Ht 64.0 in | Wt 163.8 lb

## 2015-08-02 DIAGNOSIS — R609 Edema, unspecified: Secondary | ICD-10-CM | POA: Diagnosis not present

## 2015-08-02 DIAGNOSIS — Z9861 Coronary angioplasty status: Secondary | ICD-10-CM

## 2015-08-02 DIAGNOSIS — I2510 Atherosclerotic heart disease of native coronary artery without angina pectoris: Secondary | ICD-10-CM

## 2015-08-02 DIAGNOSIS — N289 Disorder of kidney and ureter, unspecified: Secondary | ICD-10-CM | POA: Diagnosis not present

## 2015-08-02 DIAGNOSIS — E785 Hyperlipidemia, unspecified: Secondary | ICD-10-CM | POA: Diagnosis not present

## 2015-08-02 DIAGNOSIS — I739 Peripheral vascular disease, unspecified: Secondary | ICD-10-CM

## 2015-08-02 DIAGNOSIS — R600 Localized edema: Secondary | ICD-10-CM

## 2015-08-02 LAB — BASIC METABOLIC PANEL WITH GFR
BUN: 22 mg/dL (ref 7–25)
CALCIUM: 10.1 mg/dL (ref 8.6–10.4)
CO2: 28 mmol/L (ref 20–31)
CREATININE: 1.24 mg/dL — AB (ref 0.60–0.93)
Chloride: 105 mmol/L (ref 98–110)
GFR, Est African American: 49 mL/min — ABNORMAL LOW (ref >=60)
GFR, Est Non African American: 42 mL/min — ABNORMAL LOW (ref >=60)
Glucose, Bld: 85 mg/dL (ref 65–99)
Potassium: 4.3 mmol/L (ref 3.5–5.3)
SODIUM: 142 mmol/L (ref 135–146)

## 2015-08-02 MED ORDER — AMLODIPINE BESYLATE 5 MG PO TABS: 5.0000 mg | ORAL_TABLET | Freq: Every day | ORAL | Status: DC

## 2015-08-02 MED ORDER — FUROSEMIDE 20 MG PO TABS: 20.0000 mg | ORAL_TABLET | Freq: Two times a day (BID) | ORAL | Status: DC

## 2015-08-02 MED ORDER — CEPHALEXIN 500 MG PO CAPS: 500.0000 mg | ORAL_CAPSULE | Freq: Three times a day (TID) | ORAL | Status: DC

## 2015-08-02 MED ORDER — POTASSIUM CHLORIDE ER 10 MEQ PO TBCR: 10.0000 meq | EXTENDED_RELEASE_TABLET | Freq: Two times a day (BID) | ORAL | Status: DC

## 2015-08-02 NOTE — Progress Notes (Signed)
Cardiology Office Note   Date:  08/02/2015   ID:  Jessica Taylor, DOB [birth year], MRN [id number]  PCP:  Jessica Kroner, MD  Cardiologist:  Dr. Gwenlyn Taylor    Chief Complaint  Patient presents with  . Leg Swelling      History of Present Illness: Jessica Taylor is a 74 y.o. female who presents for lower extremity edema.  She has had MI in April of this year.  Her EF by cath was 35% by echo the following day 60%. 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 She had lifestyle limiting claudication. Dr. Adora Fridge referred her to Dr. Trula Slade for consideration of aortobifemoral bypass grafting however he thought this was high risk especially in light of the fact that she was on [place] for her recently placed coronary stent and referred her back for percutaneous revascularization. Aneurysm measures approximately 3.3 cm. Dr. Adora Fridge  performed percutaneous intervention on her iliac arteries 06/27/15 performing diamondback orbital rotational atherectomy, PTA and covered stenting using ICast covered stents.her symptoms completely resolved and her ABIs normalized. She's got 2+ pedal pulses bilaterally.   She now has edema bil. Though Lt > rt. Lt also with mild erythema. When legs first became swollen they improved but now edema has been back for several weeks.  No chest pain and no SOB.  She sleeps in a recliner but can put the head back pretty flat. She has increased salt intake recently as well.     Past Medical History  Diagnosis Date  . Acid reflux   . Hypothyroidism   . MI, old 23  . Hypercholesteremia   . Vertigo     when lays on left side.  . Cancer     basal cell on nose  . Anemia   . Coronary artery disease   . Hypertension   . Arthritis     knees , R shoulder - tx /w injection - 11/2014  . Tobacco abuse   . Abdominal aortic aneurysm   . Peripheral  arterial disease     hhigh-grade ostial bilateral calcified iliac stenosis with claudication    Past Surgical History  Procedure Laterality Date  . Coronary stent placement    . Cardiac catheterization  5 stents  . Tubal ligation    . Cataract extraction w/phaco Left 05/24/2014    Procedure: CATARACT EXTRACTION PHACO AND INTRAOCULAR LENS PLACEMENT (IOC);  Surgeon: Tonny Branch, MD;  Location: [facility];  Service: Ophthalmology;  Laterality: Left;  CDE:  9.30  . Cataract extraction w/phaco Right 06/18/2014    Procedure: CATARACT EXTRACTION PHACO AND INTRAOCULAR LENS PLACEMENT RIGHT EYE CDE=10.84;  Surgeon: Tonny Branch, MD;  Location: [facility];  Service: Ophthalmology;  Laterality: Right;  . Eye surgery    . Tonsillectomy      age 60  . Partial knee arthroplasty Right 02/04/2015    Procedure: UNICOMPARTMENTAL KNEE;  Surgeon: Dorna Leitz, MD;  Location: [place];  Service: Orthopedics;  Laterality: Right;  . Left heart catheterization with coronary angiogram N/A 03/06/2015    Procedure: LEFT HEART CATHETERIZATION WITH CORONARY ANGIOGRAM;  Surgeon: Lorretta Harp, MD;  Location: [facility] CATH LAB;  Service: Cardiovascular;  Laterality: N/A;  . Coronary stent placement  03/06/15    CFX DES  . Peripheral vascular catheterization Bilateral 05/13/2015    Procedure: Lower Extremity Angiography;  Surgeon: Lorretta Harp, MD;  Location: [facility]  INVASIVE CV LAB;  Service: Cardiovascular;  Laterality: Bilateral;  . Peripheral vascular catheterization N/A 05/13/2015    Procedure: Abdominal Aortogram;  Surgeon: Lorretta Harp, MD;  Location: [place] CV LAB;  Service: Cardiovascular;  Laterality: N/A;  . Peripheral vascular catheterization Bilateral 06/27/2015    Procedure: Peripheral Vascular Intervention;  Surgeon: Lorretta Harp, MD;  Location: [place] CV LAB;  Service: Cardiovascular;  Laterality: Bilateral;  ILIACS  . Peripheral vascular catheterization Bilateral 06/27/2015    Procedure: Peripheral Vascular  Atherectomy;  Surgeon: Lorretta Harp, MD;  Location: [place] CV LAB;  Service: Cardiovascular;  Laterality: Bilateral;     Current Outpatient Prescriptions  Medication Sig Dispense Refill  . amLODipine (NORVASC) 10 MG tablet Take 10 mg by mouth daily before breakfast.     . aspirin EC 81 MG tablet Take 81 mg by mouth at bedtime.    Marland Kitchen atorvastatin (LIPITOR) 40 MG tablet Take 1 tablet (40 mg total) by mouth daily. 30 tablet 11  . clopidogrel (PLAVIX) 75 MG tablet Take 1 tablet (75 mg total) by mouth daily. 30 tablet 11  . esomeprazole (NEXIUM) 40 MG capsule TAKE 1 CAPSULE BY MOUTH DAILY AT NOON. 30 capsule 5  . HYDROcodone-acetaminophen (NORCO/VICODIN) 5-325 MG per tablet Take 2 tablets by mouth every 4 (four) hours as needed. (Patient taking differently: Take 2 tablets by mouth every 4 (four) hours as needed (pain). ) 10 tablet 0  . Ketotifen Fumarate (THERA TEARS ALLERGY OP) Apply 1 drop to eye 2 (two) times daily as needed (dry eyes).    Marland Kitchen levothyroxine (SYNTHROID, LEVOTHROID) 50 MCG tablet Take 50 mcg by mouth daily before breakfast.    . metoprolol tartrate (LOPRESSOR) 25 MG tablet Take 1 tablet (25 mg total) by mouth 2 (two) times daily. 60 tablet 6  . nitroGLYCERIN (NITROSTAT) 0.4 MG SL tablet Place 1 tablet (0.4 mg total) under the tongue every 5 (five) minutes x 3 doses as needed for chest pain. 25 tablet 3   Current Facility-Administered Medications  Medication Dose Route Frequency Provider Last Rate Last Dose  . diphenhydrAMINE (BENADRYL) injection 25 mg  25 mg Intravenous Once Lorretta Harp, MD      . famotidine (PEPCID) 20 mg in sodium chloride 0.9 % 50 mL IVPB  20 mg Intravenous Once Lorretta Harp, MD      . methylPREDNISolone sodium succinate (SOLU-MEDROL) 125 mg/2 mL injection 60 mg  60 mg Intramuscular Once Lorretta Harp, MD        Allergies:   Shrimp    Social History:  The patient  reports that she quit smoking about 16 years ago. Her smoking use included  Cigarettes. She has a 63 pack-year smoking history. She has never used smokeless tobacco. She reports that she does not drink alcohol or use illicit drugs.   Family History:  The patient's family history includes Cancer (age of onset: 1) in her father; Cancer (age of onset: 77) in her mother; Hypertension in her maternal grandmother and son; Stroke in her maternal grandfather. There is no history of Heart attack.    ROS:  General:no colds or fevers,  weight up 2-3 lbs Skin:no rashes or ulcers, mild redness Lt ant leg.  HEENT:no blurred vision, no congestion CV:see HPI PUL:see HPI GI:no diarrhea constipation or melena, no indigestion GU:no hematuria, no dysuria MS:no joint pain, no claudication Neuro:no syncope, no lightheadedness Endo:no diabetes, + thyroid disease  Wt Readings from Last 3 Encounters:  08/02/15 163 lb  12 oz (74.277 kg)  07/17/15 162 lb 14.4 oz (73.891 kg)  06/28/15 161 lb 13.1 oz (73.4 kg)     PHYSICAL EXAM: VS:  BP 115/62 mmHg  Pulse 70  Ht 5\' 4"  (1.626 m)  Wt 163 lb 12 oz (74.277 kg)  BMI 28.09 kg/m2 , BMI Body mass index is 28.09 kg/(m^2). General:Pleasant affect, NAD Skin:Warm and dry, brisk capillary refill HEENT:normocephalic, sclera clear, mucus membranes moist Neck:supple, no JVD, no bruits  Heart:S1S2 RRR without murmur, gallup, rub or click Lungs:clear without rales, rhonchi, or wheezes [id number], non tender, + BS, do not palpate liver spleen or masses Ext:+ 1-2 lower ext edema, 2+ pedal pulses, 2+ radial pulses Neuro:alert and oriented X 3, MAE, follows commands, + facial symmetry    EKG:  EKG is not ordered today.    Recent Labs: 02/08/2015: B Natriuretic Peptide 67.3 03/06/2015: ALT 19; TSH 0.844 06/28/2015: BUN 23*; Creatinine, Ser 1.15*; Hemoglobin 12.2; Platelets 197; Potassium 4.6; Sodium 139    Lipid Panel    Component Value Date/Time   CHOL 166 03/07/2015 0415   TRIG 188* 03/07/2015 0415   HDL 31* 03/07/2015 0415   CHOLHDL 5.4  03/07/2015 0415   VLDL 38 03/07/2015 0415   LDLCALC 97 03/07/2015 0415       Other studies Reviewed: Additional studies/ records that were reviewed today include: cardiac cath from April. Recent dopplers.   ASSESSMENT AND PLAN:  1.  Lower ext edema post diamondback orbital rotational atherectomy, PTA and covered stenting with ICast stents reducing 80% right and 90% left ostial common iliac artery stenosis to 0% - post procedure dopplers with great and no claudication  She has developed edema bil though Lt > rt. Perhaps to new blood flow and varicosities. Lt leg with mild warmth and tenderness only at the ankles. Keflex 500 mg every 8 hr X 3 days Follow up in 1 month, if edema improves may be able to stop K+ and lasix.  Decrease amlodipine to 5 mg daily, add lasix 40 mg daily ( 2- 20 mg tabs)  For 4 days then to 20 mg daily , Kdur 10 meq 2 daily for 4 days then 1 daily.  Follow up with Dr. Adora Fridge as previously instructed.   2. ckd-3 recheck BMP to eval K+ and renal function.  3. CAD with MI in April 2016 continue ASA and plavix.    4. HTN stable.     Current medicines are reviewed with the patient today.  The patient Has no concerns regarding medicines.  The following changes have been made:  See above Labs/ tests ordered today include:see above  Disposition:   FU:  see above  Lennie Muckle, NP  08/02/2015 4:04 PM    [facility] [place], [place], [place], [place] Phone: [phone]; Fax: [phone]

## 2015-08-02 NOTE — Patient Instructions (Signed)
Medication Instructions:  Your physician has recommended you make the following change in your medication:  1.  Start Lasix 20 mg take 1 tablet 2 times a day for 4 days then take 1 tablet a day 2.  Start Potassium 10 meq take 1 tablet 2 times a day for 4 days then take 1 tablet.  3.  Decrease the Amlodipine to 5 mgs.  ( you can take 1/2 of the 10 mg tablet)  Labwork: BMP TODAY  Testing/Procedures: None ordered  Follow-Up: Your physician recommends that you schedule a follow-up appointment in: [place] [facility].

## 2015-08-05 ENCOUNTER — Telehealth: Payer: Self-pay

## 2015-08-05 DIAGNOSIS — Z79899 Other long term (current) drug therapy: Secondary | ICD-10-CM

## 2015-08-05 NOTE — Telephone Encounter (Signed)
Called patient about lab results. Per Cecilie Kicks NP, labs are okay, but lets recheck BMP in 1 week after diuresing. Patient verbalized understanding and will come in on Monday 9/19 for a BMET.

## 2015-08-12 ENCOUNTER — Other Ambulatory Visit (INDEPENDENT_AMBULATORY_CARE_PROVIDER_SITE_OTHER): Payer: Medicare Other | Admitting: *Deleted

## 2015-08-12 DIAGNOSIS — Z79899 Other long term (current) drug therapy: Secondary | ICD-10-CM

## 2015-08-12 LAB — BASIC METABOLIC PANEL
BUN: 20 mg/dL (ref 6–23)
CALCIUM: 9.6 mg/dL (ref 8.4–10.5)
CO2: 30 mEq/L (ref 19–32)
Chloride: 108 mEq/L (ref 96–112)
Creatinine, Ser: 1.26 mg/dL — ABNORMAL HIGH (ref 0.40–1.20)
GFR: 43.83 mL/min — AB (ref >60.00)
GLUCOSE: 108 mg/dL — AB (ref 70–99)
POTASSIUM: 4.1 meq/L (ref 3.5–5.1)
SODIUM: 144 meq/L (ref 135–145)

## 2015-09-09 ENCOUNTER — Encounter: Payer: Self-pay | Admitting: Cardiology

## 2015-09-09 ENCOUNTER — Ambulatory Visit (INDEPENDENT_AMBULATORY_CARE_PROVIDER_SITE_OTHER): Payer: Medicare Other | Admitting: Cardiology

## 2015-09-09 VITALS — BP 140/60 | HR 68 | Ht 64.0 in | Wt 167.0 lb

## 2015-09-09 DIAGNOSIS — R600 Localized edema: Secondary | ICD-10-CM

## 2015-09-09 DIAGNOSIS — Z79899 Other long term (current) drug therapy: Secondary | ICD-10-CM | POA: Diagnosis not present

## 2015-09-09 DIAGNOSIS — I10 Essential (primary) hypertension: Secondary | ICD-10-CM | POA: Diagnosis not present

## 2015-09-09 DIAGNOSIS — R609 Edema, unspecified: Secondary | ICD-10-CM | POA: Diagnosis not present

## 2015-09-09 DIAGNOSIS — N289 Disorder of kidney and ureter, unspecified: Secondary | ICD-10-CM

## 2015-09-09 DIAGNOSIS — I739 Peripheral vascular disease, unspecified: Secondary | ICD-10-CM | POA: Diagnosis not present

## 2015-09-09 MED ORDER — FUROSEMIDE 20 MG PO TABS: ORAL_TABLET | ORAL | Status: DC

## 2015-09-09 NOTE — Progress Notes (Signed)
Cardiology Office Note   Date:  09/09/2015   ID:  Jessica Taylor, DOB August 08, 1941, MRN [id number]  PCP:  [name], MD  Cardiologist:  Dr. Gwenlyn Found    Chief Complaint  Patient presents with  . Leg Swelling    improved       History of Present Illness: Jessica Taylor is a 74 y.o. female who presents for BP check and Lt lower ext check for edema.    She has had MI in April of this year. Her EF by cath was 35% by echo the following day 60%. 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 She had lifestyle limiting claudication. Dr. Adora Fridge referred her to Dr. Trula Slade for consideration of aortobifemoral bypass grafting however he thought this was high risk especially in light of the fact that she was on [place] for her recently placed coronary stent and referred her back for percutaneous revascularization. Aneurysm measures approximately 3.3 cm. Dr. Adora Fridge performed percutaneous intervention on her iliac arteries 06/27/15 performing diamondback orbital rotational atherectomy, PTA and covered stenting using ICast covered stents.her symptoms completely resolved and her ABIs normalized. She's got 2+ pedal pulses bilaterally.   On her last visit her lt lower ext with edema and mild erythema, I added Lasix and decrease amlodipine with improvement. She is back today for follow up.  Edema improved but still present on Lt lower ext and at times has pink color after showers or standing.  Denies chest pain.  No SOB.   Past Medical History  Diagnosis Date  . Acid reflux   . Hypothyroidism   . MI, old 48  . Hypercholesteremia   . Vertigo     when lays on left side.  . 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 Anemia   . Coronary artery disease   . Hypertension   . Arthritis     knees , R shoulder - tx /w injection - 11/2014  . Tobacco abuse   . Abdominal aortic  aneurysm ([place])   . Peripheral arterial disease (HCC)     hhigh-grade ostial bilateral calcified iliac stenosis with claudication    Past Surgical History  Procedure Laterality Date  . Coronary stent placement    . Cardiac catheterization  5 stents  . Tubal ligation    . Cataract extraction w/phaco Left 05/24/2014    Procedure: CATARACT EXTRACTION PHACO AND INTRAOCULAR LENS PLACEMENT (IOC);  Surgeon: Tonny Branch, MD;  Location: [facility];  Service: Ophthalmology;  Laterality: Left;  CDE:  9.30  . Cataract extraction w/phaco Right 06/18/2014    Procedure: CATARACT EXTRACTION PHACO AND INTRAOCULAR LENS PLACEMENT RIGHT EYE CDE=10.84;  Surgeon: Tonny Branch, MD;  Location: [facility];  Service: Ophthalmology;  Laterality: Right;  . Eye surgery    . Tonsillectomy      age 24  . Partial knee arthroplasty Right 02/04/2015    Procedure: UNICOMPARTMENTAL KNEE;  Surgeon: Dorna Leitz, MD;  Location: [place] [facility];  Service: Orthopedics;  Laterality: Right;  . Left heart catheterization with coronary angiogram N/A 03/06/2015    Procedure: LEFT HEART CATHETERIZATION WITH CORONARY ANGIOGRAM;  Surgeon: Lorretta Harp, MD;  Location: [facility] CATH LAB;  Service: Cardiovascular;  Laterality: N/A;  . Coronary stent placement  03/06/15    CFX DES  . Peripheral vascular catheterization Bilateral 05/13/2015    Procedure: Lower Extremity Angiography;  Surgeon: Lorretta Harp, MD;  Location: [place] [facility];  Service: Cardiovascular;  Laterality: Bilateral;  . Peripheral vascular catheterization N/A 05/13/2015    Procedure: Abdominal Aortogram;  Surgeon: Lorretta Harp, MD;  Location: [place] [facility];  Service: Cardiovascular;  Laterality: N/A;  . Peripheral vascular catheterization Bilateral 06/27/2015    Procedure: Peripheral Vascular Intervention;  Surgeon: Lorretta Harp, MD;  Location: [place] [facility];  Service: Cardiovascular;  Laterality: Bilateral;  ILIACS  . Peripheral vascular catheterization Bilateral 06/27/2015     Procedure: Peripheral Vascular Atherectomy;  Surgeon: Lorretta Harp, MD;  Location: [place] [facility];  Service: Cardiovascular;  Laterality: Bilateral;     Current Outpatient Prescriptions  Medication Sig Dispense Refill  . amLODipine (NORVASC) 10 MG tablet Take 10 mg by mouth daily.     Marland Kitchen aspirin EC 81 MG tablet Take 81 mg by mouth at bedtime.    Marland Kitchen atorvastatin (LIPITOR) 40 MG tablet Take 1 tablet (40 mg total) by mouth daily. 30 tablet 11  . clopidogrel (PLAVIX) 75 MG tablet Take 1 tablet (75 mg total) by mouth daily. 30 tablet 11  . esomeprazole (NEXIUM) 40 MG capsule TAKE 1 CAPSULE BY MOUTH DAILY AT NOON. 30 capsule 5  . furosemide (LASIX) 20 MG tablet Take one tablet by mouth daily except on Monday, Wednesday and Friday, take two tablets 135 tablet 3  . HYDROcodone-acetaminophen (NORCO/VICODIN) 5-325 MG tablet Take 1 tablet by mouth every 6 (six) hours as needed for moderate pain (PAIN).    Marland Kitchen Ketotifen Fumarate (THERA TEARS ALLERGY OP) Apply 1 drop to eye 2 (two) times daily as needed (dry eyes).    Marland Kitchen levothyroxine (SYNTHROID, LEVOTHROID) 50 MCG tablet Take 50 mcg by mouth daily before breakfast.    . metoprolol tartrate (LOPRESSOR) 25 MG tablet Take 1 tablet (25 mg total) by mouth 2 (two) times daily. 60 tablet 6  . nitroGLYCERIN (NITROSTAT) 0.4 MG SL tablet Place 1 tablet (0.4 mg total) under the tongue every 5 (five) minutes x 3 doses as needed for chest pain. 25 tablet 3  . potassium chloride (K-DUR) 10 MEQ tablet Take 1 tablet (10 mEq total) by mouth 2 (two) times daily. Take 1 tablet by mouth twice a day for 4 days then take 1 tablet by mouth 1 time a day 90 tablet 3   Current Facility-Administered Medications  Medication Dose Route Frequency Provider Last Rate Last Dose  . diphenhydrAMINE (BENADRYL) injection 25 mg  25 mg Intravenous Once Lorretta Harp, MD      . famotidine (PEPCID) 20 mg in sodium chloride 0.9 % 50 mL IVPB  20 mg Intravenous Once Lorretta Harp, MD        . methylPREDNISolone sodium succinate (SOLU-MEDROL) 125 mg/2 mL injection 60 mg  60 mg Intramuscular Once Lorretta Harp, MD        Allergies:   Shrimp    Social History:  The patient  reports that she quit smoking about 16 years ago. Her smoking use included Cigarettes. She has a 63 pack-year smoking history. She has never used smokeless tobacco. She reports that she does not drink alcohol or use illicit drugs.   Family History:  The patient's family history includes Cancer (age of onset: 29) in her father; Cancer (age of onset: 22) in her mother; Hypertension in her maternal grandmother and son; Stroke in her maternal grandfather. There is no history of Heart attack.    ROS:  General:no colds  or fevers, no weight changes Skin:no rashes or ulcers- lt ankle pink after showers.  HEENT:no blurred vision, no congestion CV:see HPI PUL:see HPI MS:no joint pain, no claudication Neuro:no syncope, no lightheadedness Endo:no diabetes, + thyroid disease  Wt Readings from Last 3 Encounters:  09/09/15 167 lb (75.751 kg)  08/02/15 163 lb 12 oz (74.277 kg)  07/17/15 162 lb 14.4 oz (73.891 kg)     PHYSICAL EXAM: VS:  BP 140/60 mmHg  Pulse 68  Ht 5\' 4"  (1.626 m)  Wt 167 lb (75.751 kg)  BMI 28.65 kg/m2 , BMI Body mass index is 28.65 kg/(m^2). General:Pleasant affect, NAD Skin:Warm and dry, brisk capillary refill HEENT:normocephalic, sclera clear, mucus membranes moist Neck:supple, no JVD, no bruits  Heart:S1S2 RRR without murmur, gallup, rub or click Lungs:clear without rales, rhonchi, or wheezes [id number], non tender, + BS, do not palpate liver spleen or masses Ext:no lower ext edema, 2+ pedal pulses, 2+ radial pulses Neuro:alert and oriented X 3, MAE, follows commands, + facial symmetry    EKG:  EKG is NOT ordered today.    Recent Labs: 02/08/2015: B Natriuretic Peptide 67.3 03/06/2015: ALT 19; TSH 0.844 06/28/2015: Hemoglobin 12.2; Platelets 197 08/12/2015: BUN 20; Creatinine, Ser  1.26*; Potassium 4.1; Sodium 144    Lipid Panel    Component Value Date/Time   CHOL 166 03/07/2015 0415   TRIG 188* 03/07/2015 0415   HDL 31* 03/07/2015 0415   CHOLHDL 5.4 03/07/2015 0415   VLDL 38 03/07/2015 0415   LDLCALC 97 03/07/2015 0415       Other studies Reviewed: Additional studies/ records that were reviewed today include: previous notes.   ASSESSMENT AND PLAN:  1.  Lower ext edema post diamondback orbital rotational atherectomy, PTA and covered stenting with ICast stents reducing 80% right and 90% left ostial common iliac artery stenosis to 0% - post procedure dopplers with great and no claudication She has developed edema bil though Lt > rt. Perhaps to new blood flow and varicosities. Lt leg with mild warmth and tenderness only at the ankles.-now back on 20 lasix a day, will increase to 20 daily except MWF she will take 40 mg.  Follow up with Dr. Adora Fridge in Dec.   2. ckd-3 recheck BMP to eval K+ and renal function in 1 month.   3. CAD with MI in April 2016 continue ASA and plavix. no chest pain  4. HTN elevated today, should improve with the increased lasix MWF .   Current medicines are reviewed with the patient today.  The patient Has no concerns regarding medicines.  The following changes have been made:  See above Labs/ tests ordered today include:see above  Disposition:   FU:  see above  Signed, Isaiah Serge, NP  09/09/2015 4:40 PM    [facility] [place], [place], [address] [place], [place] Phone: [phone]; Fax: [phone]

## 2015-09-09 NOTE — Patient Instructions (Addendum)
Medication Instructions:  1) START taking Furosemide 20mg  once daily except on Monday, Wednesday and Friday, take 2 tablets to = 40mg   Labwork: Your physician recommends that you return for lab work in: 1 month (BMET)   Testing/Procedures: None  Follow-Up: Your physician recommends that you schedule a follow-up appointment in: Dr. Gwenlyn Found in December   Any Other Special Instructions Will Be Listed Below (If Applicable).

## 2015-10-14 ENCOUNTER — Other Ambulatory Visit: Payer: Medicare Other

## 2015-10-18 ENCOUNTER — Other Ambulatory Visit: Payer: Self-pay | Admitting: Nurse Practitioner

## 2015-11-01 ENCOUNTER — Ambulatory Visit: Payer: Medicare Other | Admitting: Cardiovascular Disease

## 2015-12-26 ENCOUNTER — Other Ambulatory Visit: Payer: Self-pay | Admitting: Cardiology

## 2015-12-27 NOTE — Telephone Encounter (Signed)
Rx request sent to pharmacy.  

## 2015-12-27 NOTE — Telephone Encounter (Signed)
p 

## 2016-01-15 ENCOUNTER — Telehealth: Payer: Self-pay | Admitting: Cardiology

## 2016-01-15 NOTE — Telephone Encounter (Signed)
Pt have been having chest pains off and on since yesterday. She have been taking Nitroglycerin and that stops the pain. She wants to know should she still take her blood thinner? She did take her aspirin today around 12:30.

## 2016-01-15 NOTE — Telephone Encounter (Signed)
Left message to call back on only number listed for pt.

## 2016-01-15 NOTE — Telephone Encounter (Signed)
Agree. Lindell Noe, MD

## 2016-01-15 NOTE — Telephone Encounter (Signed)
Jessica Taylor feels she is having a lot more chest pain than normal that is started with just a minimal amount of activity. She doesn't feel she can come in for an OV, feels she should go on to the ED because she has had two heart attacks in the past. She wonders if she should take her blood thinner; I told her that she should go ahead and take it

## 2016-01-16 ENCOUNTER — Inpatient Hospital Stay (HOSPITAL_COMMUNITY)
Admission: EM | Admit: 2016-01-16 | Discharge: 2016-01-31 | DRG: 234 | Disposition: A | Discharge status: Home / Self Care | Payer: Medicare Other | Attending: Surgery | Admitting: Surgery

## 2016-01-16 ENCOUNTER — Emergency Department (HOSPITAL_COMMUNITY): Payer: Medicare Other

## 2016-01-16 ENCOUNTER — Encounter (HOSPITAL_COMMUNITY): Payer: Self-pay | Admitting: Emergency Medicine

## 2016-01-16 DIAGNOSIS — I2510 Atherosclerotic heart disease of native coronary artery without angina pectoris: Secondary | ICD-10-CM | POA: Insufficient documentation

## 2016-01-16 DIAGNOSIS — Z87891 Personal history of nicotine dependence: Secondary | ICD-10-CM

## 2016-01-16 DIAGNOSIS — R079 Chest pain, unspecified: Secondary | ICD-10-CM | POA: Diagnosis not present

## 2016-01-16 DIAGNOSIS — E782 Mixed hyperlipidemia: Secondary | ICD-10-CM | POA: Diagnosis present

## 2016-01-16 DIAGNOSIS — Z7902 Long term (current) use of antithrombotics/antiplatelets: Secondary | ICD-10-CM

## 2016-01-16 DIAGNOSIS — I25110 Atherosclerotic heart disease of native coronary artery with unstable angina pectoris: Principal | ICD-10-CM | POA: Diagnosis present

## 2016-01-16 DIAGNOSIS — Z955 Presence of coronary angioplasty implant and graft: Secondary | ICD-10-CM

## 2016-01-16 DIAGNOSIS — I10 Essential (primary) hypertension: Secondary | ICD-10-CM | POA: Diagnosis present

## 2016-01-16 DIAGNOSIS — R001 Bradycardia, unspecified: Secondary | ICD-10-CM | POA: Diagnosis not present

## 2016-01-16 DIAGNOSIS — R34 Anuria and oliguria: Secondary | ICD-10-CM | POA: Diagnosis not present

## 2016-01-16 DIAGNOSIS — E8770 Fluid overload, unspecified: Secondary | ICD-10-CM | POA: Diagnosis not present

## 2016-01-16 DIAGNOSIS — I739 Peripheral vascular disease, unspecified: Secondary | ICD-10-CM | POA: Diagnosis present

## 2016-01-16 DIAGNOSIS — R110 Nausea: Secondary | ICD-10-CM | POA: Diagnosis not present

## 2016-01-16 DIAGNOSIS — I714 Abdominal aortic aneurysm, without rupture: Secondary | ICD-10-CM | POA: Diagnosis present

## 2016-01-16 DIAGNOSIS — I129 Hypertensive chronic kidney disease with stage 1 through stage 4 chronic kidney disease, or unspecified chronic kidney disease: Secondary | ICD-10-CM | POA: Diagnosis present

## 2016-01-16 DIAGNOSIS — E039 Hypothyroidism, unspecified: Secondary | ICD-10-CM | POA: Diagnosis present

## 2016-01-16 DIAGNOSIS — Z8249 Family history of ischemic heart disease and other diseases of the circulatory system: Secondary | ICD-10-CM

## 2016-01-16 DIAGNOSIS — Z91013 Allergy to seafood: Secondary | ICD-10-CM

## 2016-01-16 DIAGNOSIS — Z9861 Coronary angioplasty status: Secondary | ICD-10-CM

## 2016-01-16 DIAGNOSIS — I252 Old myocardial infarction: Secondary | ICD-10-CM

## 2016-01-16 DIAGNOSIS — Z79899 Other long term (current) drug therapy: Secondary | ICD-10-CM

## 2016-01-16 DIAGNOSIS — D62 Acute posthemorrhagic anemia: Secondary | ICD-10-CM | POA: Diagnosis not present

## 2016-01-16 DIAGNOSIS — Z7982 Long term (current) use of aspirin: Secondary | ICD-10-CM

## 2016-01-16 DIAGNOSIS — R0789 Other chest pain: Secondary | ICD-10-CM

## 2016-01-16 DIAGNOSIS — N183 Chronic kidney disease, stage 3 (moderate): Secondary | ICD-10-CM | POA: Diagnosis present

## 2016-01-16 DIAGNOSIS — K219 Gastro-esophageal reflux disease without esophagitis: Secondary | ICD-10-CM | POA: Diagnosis present

## 2016-01-16 DIAGNOSIS — I200 Unstable angina: Secondary | ICD-10-CM

## 2016-01-16 DIAGNOSIS — R319 Hematuria, unspecified: Secondary | ICD-10-CM

## 2016-01-16 DIAGNOSIS — M1711 Unilateral primary osteoarthritis, right knee: Secondary | ICD-10-CM | POA: Diagnosis present

## 2016-01-16 DIAGNOSIS — Z951 Presence of aortocoronary bypass graft: Secondary | ICD-10-CM

## 2016-01-16 LAB — BASIC METABOLIC PANEL
Anion gap: 11 (ref 5–15)
BUN: 22 mg/dL — ABNORMAL HIGH (ref 6–20)
CHLORIDE: 108 mmol/L (ref 101–111)
CO2: 23 mmol/L (ref 22–32)
CREATININE: 1.37 mg/dL — AB (ref 0.44–1.00)
Calcium: 9.6 mg/dL (ref 8.9–10.3)
GFR calc Af Amer: 42 mL/min — ABNORMAL LOW (ref >60)
GFR calc non Af Amer: 36 mL/min — ABNORMAL LOW (ref >60)
Glucose, Bld: 92 mg/dL (ref 65–99)
POTASSIUM: 4.8 mmol/L (ref 3.5–5.1)
SODIUM: 142 mmol/L (ref 135–145)

## 2016-01-16 LAB — I-STAT TROPONIN, ED: TROPONIN I, POC: 0 ng/mL (ref 0.00–0.08)

## 2016-01-16 LAB — CBC WITH DIFFERENTIAL/PLATELET
Basophils Absolute: 0 10*3/uL (ref 0.0–0.1)
Basophils Relative: 0 %
Eosinophils Absolute: 0.1 10*3/uL (ref 0.0–0.7)
Eosinophils Relative: 2 %
HEMATOCRIT: 44 % (ref 36.0–46.0)
HEMOGLOBIN: 13.8 g/dL (ref 12.0–15.0)
LYMPHS ABS: 1.2 10*3/uL (ref 0.7–4.0)
LYMPHS PCT: 18 %
MCH: 27.8 pg (ref 26.0–34.0)
MCHC: 31.4 g/dL (ref 30.0–36.0)
MCV: 88.5 fL (ref 78.0–100.0)
MONOS PCT: 6 %
Monocytes Absolute: 0.4 10*3/uL (ref 0.1–1.0)
NEUTROS ABS: 4.9 10*3/uL (ref 1.7–7.7)
NEUTROS PCT: 74 %
Platelets: 169 10*3/uL (ref 150–400)
RBC: 4.97 MIL/uL (ref 3.87–5.11)
RDW: 15.4 % (ref 11.5–15.5)
WBC: 6.7 10*3/uL (ref 4.0–10.5)

## 2016-01-16 NOTE — ED Notes (Signed)
Per EMS, called to home w/ family c/o chest pain, that started yesterday.  Yesterday she took a total of 7 nitro (everytime chest pain returned) and today she took 4 nitro to relieve the chest pain, each time only getting temperary relief.  Was given one nitro and 324 of ASA in route which relieved that chest pain.  NS on monitor w/ an unremarkable EKG.  She has an extensive cardiac hx including 7 stints.  No pain at this time

## 2016-01-16 NOTE — ED Provider Notes (Signed)
CSN: [id number]     Arrival date & time 01/16/16  1927 History   First MD Initiated Contact with Patient 01/16/16 1936     Chief Complaint  Patient presents with  . Chest Pain   Patient is a 75 y.o. female presenting with chest pain. The history is provided by the patient. No language interpreter was used.  Chest Pain Pain location:  Substernal area Pain quality: pressure   Pain radiates to:  Does not radiate Pain radiates to the back: no   Pain severity:  Moderate Onset quality:  Sudden Duration:  1 day Timing:  Intermittent Progression:  Unchanged Chronicity:  New Context: at rest   Relieved by:  Nitroglycerin Worsened by:  Nothing tried Associated symptoms: no abdominal pain, no anxiety, no back pain, no cough, no dizziness, no dysphagia, no fever, no headache, no nausea, no numbness, no palpitations, no shortness of breath, not vomiting and no weakness   Risk factors: coronary artery disease and prior DVT/PE   Risk factors: no diabetes mellitus     Past Medical History  Diagnosis Date  . Acid reflux   . Hypothyroidism   . 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 Hypercholesteremia   . Vertigo     when lays on left side.  . Cancer ([place])     basal cell on nose  . Anemia   . Coronary artery disease   . Hypertension   . Arthritis     knees , R shoulder - tx /w injection - 11/2014  . Tobacco abuse   . Abdominal aortic aneurysm ([place])   . Peripheral arterial disease (HCC)     hhigh-grade ostial bilateral calcified iliac stenosis with claudication   Past Surgical History  Procedure Laterality Date  . Coronary stent placement    . Cardiac catheterization  5 stents  . Tubal ligation    . Cataract extraction w/phaco Left 05/24/2014    Procedure: CATARACT EXTRACTION PHACO AND INTRAOCULAR LENS PLACEMENT (IOC);  Surgeon: Tonny Branch, MD;  [facility]: [facility];  Service: Ophthalmology;  Laterality: Left;  CDE:  9.30  . Cataract extraction w/phaco Right 06/18/2014    Procedure: CATARACT EXTRACTION PHACO AND  INTRAOCULAR LENS PLACEMENT RIGHT EYE CDE=10.84;  Surgeon: Tonny Branch, MD;  [facility];  Service: Ophthalmology;  Laterality: Right;  . Eye surgery    . Tonsillectomy      age 8  . Partial knee arthroplasty Right 02/04/2015    Procedure: UNICOMPARTMENTAL KNEE;  Surgeon: Dorna Leitz, MD;  Location: [place];  Service: Orthopedics;  Laterality: Right;  . Left heart catheterization with coronary angiogram N/A 03/06/2015    Procedure: LEFT HEART CATHETERIZATION WITH CORONARY ANGIOGRAM;  Surgeon: Lorretta Harp, MD;  Location: [facility] C[facility];  Service: Cardiovascular;  Laterality: N/A;  . Coronary stent placement  03/06/15    CFX DES  . Peripheral vascular catheterization Bilateral 05/13/2015    Procedure: Lower Extremity Angiography;  Surgeon: Lorretta Harp, MD;  Location: [place] [facility];  Service: Cardiovascular;  Laterality: Bilateral;  . Peripheral vascular catheterization N/A 05/13/2015    Procedure: Abdominal Aortogram;  Surgeon: Lorretta Harp, MD;  Location: [place] [facility];  Service: Cardiovascular;  Laterality: N/A;  . Peripheral vascular catheterization Bilateral 06/27/2015    Procedure: Peripheral Vascular Intervention;  Surgeon: Lorretta Harp, MD;  Location: [place] [facility];  Service: Cardiovascular;  Laterality: Bilateral;  ILIACS  . Peripheral vascular catheterization Bilateral 06/27/2015    Procedure: Peripheral Vascular Atherectomy;  Surgeon:  Lorretta Harp, MD;  Location: [place] [facility];  Service: Cardiovascular;  Laterality: Bilateral;   Family History  Problem Relation Age of Onset  . Cancer Mother 63  . Cancer Father 98  . Heart attack Neg Hx   . Stroke Maternal Grandfather   . Hypertension Maternal Grandmother   . Hypertension Son    Social History  Substance Use Topics  . Smoking status: Former Smoker -- 1.50 packs/day for 42 years    Types: Cigarettes    Quit date: 05/19/1999  . Smokeless tobacco: Never Used  . Alcohol Use: No   OB History    No  data available     Review of Systems  Constitutional: Negative for fever, chills, activity change and appetite change.  HENT: Negative for congestion, dental problem, ear pain, facial swelling, hearing loss, rhinorrhea, sneezing, sore throat, trouble swallowing and voice change.   Eyes: Negative for photophobia, pain, redness and visual disturbance.  Respiratory: Positive for chest tightness. Negative for apnea, cough, shortness of breath, wheezing and stridor.   Cardiovascular: Positive for chest pain. Negative for palpitations and leg swelling.  Gastrointestinal: Negative for nausea, vomiting, abdominal pain, diarrhea, constipation, blood in stool and abdominal distention.  Endocrine: Negative for polydipsia and polyuria.  Genitourinary: Negative for frequency, hematuria, flank pain, decreased urine volume and difficulty urinating.  Musculoskeletal: Negative for back pain, joint swelling, gait problem, neck pain and neck stiffness.  Skin: Negative for rash and wound.  Allergic/Immunologic: Negative for immunocompromised state.  Neurological: Negative for dizziness, syncope, facial asymmetry, speech difficulty, weakness, light-headedness, numbness and headaches.  Hematological: Negative for adenopathy.  Psychiatric/Behavioral: Negative for suicidal ideas, behavioral problems, confusion, sleep disturbance and agitation. The patient is not nervous/anxious.   All other systems reviewed and are negative.     Allergies  Shrimp  Home Medications   Prior to Admission medications   Medication Sig Start Date End Date Taking? Authorizing Provider  amLODipine (NORVASC) 5 MG tablet Take 5 mg by mouth daily.  11/22/15  Yes Historical Provider, MD  aspirin EC 81 MG tablet Take 81 mg by mouth daily.    Yes Historical Provider, MD  atorvastatin (LIPITOR) 40 MG tablet Take 1 tablet (40 mg total) by mouth daily. 07/18/15  Yes Jerline Pain, MD  clopidogrel (PLAVIX) 75 MG tablet Take 1 tablet (75 mg  total) by mouth daily. 07/18/15  Yes Jerline Pain, MD  esomeprazole (NEXIUM) 40 MG capsule TAKE 1 CAPSULE BY MOUTH DAILY AT NOON. 12/27/15  Yes Jerline Pain, MD  HYDROcodone-acetaminophen (NORCO/VICODIN) 5-325 MG tablet Take 1 tablet by mouth every 6 (six) hours as needed for moderate pain (PAIN).   Yes Historical Provider, MD  Ketotifen Fumarate (THERA TEARS ALLERGY OP) Apply 1 drop to eye 2 (two) times daily as needed (dry eyes).   Yes Historical Provider, MD  levothyroxine (SYNTHROID, LEVOTHROID) 50 MCG tablet Take 50 mcg by mouth daily before breakfast.   Yes Historical Provider, MD  metoprolol tartrate (LOPRESSOR) 25 MG tablet TAKE 1 TABLET BY MOUTH TWICE DAILY. 10/21/15  Yes Burtis Junes, NP  nitroGLYCERIN (NITROSTAT) 0.4 MG SL tablet Place 1 tablet (0.4 mg total) under the tongue every 5 (five) minutes x 3 doses as needed for chest pain. 03/19/15  Yes Burtis Junes, NP  furosemide (LASIX) 20 MG tablet Take one tablet by mouth daily except on Monday, Wednesday and Friday, take two tablets Patient not taking: Reported on 01/16/2016 09/09/15   Isaiah Serge, NP  potassium chloride (K-DUR) 10 MEQ tablet Take 1 tablet (10 mEq total) by mouth 2 (two) times daily. Take 1 tablet by mouth twice a day for 4 days then take 1 tablet by mouth 1 time a day Patient not taking: Reported on 01/16/2016 08/02/15   Isaiah Serge, NP   BP 132/61 mmHg  Pulse 63  Temp(Src) 98.1 F (36.7 C) (Oral)  Resp 18  SpO2 94% Physical Exam  Constitutional: She is oriented to person, place, and time. She appears well-developed and well-nourished. No distress.  HENT:  Head: Normocephalic and atraumatic.  Right Ear: External ear normal.  Left Ear: External ear normal.  Eyes: Pupils are equal, round, and reactive to light. Right eye exhibits no discharge. Left eye exhibits no discharge.  Neck: Normal range of motion. No JVD present. No tracheal deviation present.  Cardiovascular: Normal rate, regular rhythm and normal  heart sounds.  Exam reveals no friction rub.   No murmur heard. Pulmonary/Chest: Effort normal and breath sounds normal. No stridor. No respiratory distress. She has no wheezes.  Abdominal: Soft. Bowel sounds are normal. She exhibits no distension. There is no rebound and no guarding.  Musculoskeletal: Normal range of motion. She exhibits no edema or tenderness.  Lymphadenopathy:    She has no cervical adenopathy.  Neurological: She is alert and oriented to person, place, and time. No cranial nerve deficit. Coordination normal.  Skin: Skin is warm and dry. No rash noted. No pallor.  Psychiatric: She has a normal mood and affect. Her behavior is normal. Judgment and thought content normal.  Nursing note and vitals reviewed.   ED Course  Procedures (including critical care time) Labs Review Labs Reviewed  BASIC METABOLIC PANEL - Abnormal; Notable for the following:    BUN 22 (*)    Creatinine, Ser 1.37 (*)    GFR calc non Af Amer 36 (*)    GFR calc Af Amer 42 (*)    All other components within normal limits  CBC WITH DIFFERENTIAL/PLATELET  I-STAT TROPOININ, ED  I-STAT TROPOININ, ED  Randolm Idol, ED    Imaging Review Dg Chest 2 View  01/16/2016  CLINICAL DATA:  Two-day history of chest pain. EXAM: CHEST  2 VIEW COMPARISON:  03/06/2015 FINDINGS: The lungs are clear wiithout focal pneumonia, edema, pneumothorax or pleural effusion. The cardiopericardial silhouette is within normal limits for size. The visualized bony structures of the thorax are intact. IMPRESSION: No active cardiopulmonary disease. Electronically Signed   By: Misty Stanley M.D.   On: 01/16/2016 20:20   I have personally reviewed and evaluated these images and lab results as part of my medical decision-making.   EKG Interpretation   Date/Time:  Thursday January 16 2016 20:32:00 EST Ventricular Rate:  56 PR Interval:  148 QRS Duration: 91 QT Interval:  456 QTC Calculation: 440 R Axis:   -17 Text  Interpretation:  Sinus rhythm Borderline left axis deviation No  significant change since last tracing Confirmed by KNOTT MD, Quillian Quince  [id number]) on 01/16/2016 9:32:17 PM      MDM   Final diagnoses:  Chest pain, unspecified chest pain type    Patient with history of CAD and severe peripheral vascular disease status post multiple cardiac stents presents for 1 day of intermittent chest pressure at rest. This chest pressure consistently improved with nitroglycerin. Is given 324 of aspirin by EMS and was given nitroglycerin which resolved her chest pain.  Upon arrival patient with EKG with no ischemic changes.  Differential diagnosis  includes ACS versus pneumothorax versus musculoskeletal versus costochondritis versus GERD. Patient with normal physical exam in the emergency department. Vital signs unremarkable.  Chest x-ray with no acute cardiopulmonary normality. BMP, CBC unremarkable. Troponin 0.00.    Discussed with cardiology at this point. Cardiology review chart and patient's history and recommended repeat troponin in admission to hospitalist service if negative. If positive cardiology will admit patient.  12:05 AM:  Repeat troponin 0.01. Patient without return of chest pain and ED. Patient hospitalist for admission.  Hospitalist to admit patient.  Discussed case with my attending Dr. Laneta Simmers.  Vira Blanco, MD 01/18/16 [id number]  Leo Grosser, MD 01/20/16 [phone]

## 2016-01-17 ENCOUNTER — Observation Stay (HOSPITAL_BASED_OUTPATIENT_CLINIC_OR_DEPARTMENT_OTHER): Payer: Medicare Other

## 2016-01-17 ENCOUNTER — Encounter (HOSPITAL_COMMUNITY): Payer: Self-pay | Admitting: *Deleted

## 2016-01-17 DIAGNOSIS — E785 Hyperlipidemia, unspecified: Secondary | ICD-10-CM

## 2016-01-17 DIAGNOSIS — I200 Unstable angina: Secondary | ICD-10-CM | POA: Diagnosis not present

## 2016-01-17 DIAGNOSIS — I25119 Atherosclerotic heart disease of native coronary artery with unspecified angina pectoris: Secondary | ICD-10-CM

## 2016-01-17 DIAGNOSIS — R319 Hematuria, unspecified: Secondary | ICD-10-CM

## 2016-01-17 DIAGNOSIS — R079 Chest pain, unspecified: Secondary | ICD-10-CM | POA: Diagnosis not present

## 2016-01-17 DIAGNOSIS — E039 Hypothyroidism, unspecified: Secondary | ICD-10-CM

## 2016-01-17 DIAGNOSIS — I25110 Atherosclerotic heart disease of native coronary artery with unstable angina pectoris: Secondary | ICD-10-CM | POA: Insufficient documentation

## 2016-01-17 LAB — PROTIME-INR
INR: 1.15 (ref 0.00–1.49)
PROTHROMBIN TIME: 14.9 s (ref 11.6–15.2)

## 2016-01-17 LAB — URINALYSIS, ROUTINE W REFLEX MICROSCOPIC
Glucose, UA: NEGATIVE mg/dL
KETONES UR: 15 mg/dL — AB
NITRITE: NEGATIVE
Protein, ur: 100 mg/dL — AB
Specific Gravity, Urine: 1.019 (ref 1.005–1.030)
pH: 5.5 (ref 5.0–8.0)

## 2016-01-17 LAB — TROPONIN I
Troponin I: <0.03 ng/mL (ref <0.031)
Troponin I: <0.03 ng/mL (ref <0.031)

## 2016-01-17 LAB — HEPARIN LEVEL (UNFRACTIONATED): Heparin Unfractionated: 0.76 IU/mL — ABNORMAL HIGH (ref 0.30–0.70)

## 2016-01-17 LAB — LIPID PANEL
Cholesterol: 177 mg/dL (ref 0–200)
HDL: 36 mg/dL — ABNORMAL LOW (ref >40)
LDL CALC: 110 mg/dL — AB (ref 0–99)
Total CHOL/HDL Ratio: 4.9 RATIO
Triglycerides: 156 mg/dL — ABNORMAL HIGH (ref <150)
VLDL: 31 mg/dL (ref 0–40)

## 2016-01-17 LAB — URINE MICROSCOPIC-ADD ON: BACTERIA UA: NONE SEEN

## 2016-01-17 LAB — I-STAT TROPONIN, ED: TROPONIN I, POC: 0.01 ng/mL (ref 0.00–0.08)

## 2016-01-17 MED ORDER — HEPARIN BOLUS VIA INFUSION
3000.0000 [IU] | Freq: Once | INTRAVENOUS | Status: AC
  Administered 2016-01-17: 3000 [IU] via INTRAVENOUS
  Filled 2016-01-17: qty 3000

## 2016-01-17 MED ORDER — PANTOPRAZOLE SODIUM 40 MG PO TBEC
80.0000 mg | DELAYED_RELEASE_TABLET | Freq: Every day | ORAL | Status: DC
  Administered 2016-01-17 – 2016-01-23 (×7): 80 mg via ORAL
  Filled 2016-01-17 (×8): qty 2

## 2016-01-17 MED ORDER — ONDANSETRON HCL 4 MG/2ML IJ SOLN: 4.0000 mg | Freq: Four times a day (QID) | INTRAMUSCULAR | Status: DC | PRN

## 2016-01-17 MED ORDER — ACETAMINOPHEN 325 MG PO TABS: 650.0000 mg | ORAL_TABLET | ORAL | Status: DC | PRN

## 2016-01-17 MED ORDER — ATORVASTATIN CALCIUM 40 MG PO TABS
40.0000 mg | ORAL_TABLET | Freq: Every day | ORAL | Status: DC
  Administered 2016-01-17 – 2016-01-18 (×2): 40 mg via ORAL
  Filled 2016-01-17: qty 4
  Filled 2016-01-17: qty 1

## 2016-01-17 MED ORDER — METOPROLOL TARTRATE 25 MG PO TABS
25.0000 mg | ORAL_TABLET | Freq: Two times a day (BID) | ORAL | Status: DC
  Administered 2016-01-17 – 2016-01-23 (×13): 25 mg via ORAL
  Filled 2016-01-17 (×13): qty 1

## 2016-01-17 MED ORDER — MORPHINE SULFATE (PF) 2 MG/ML IV SOLN: 2.0000 mg | INTRAVENOUS | Status: DC | PRN

## 2016-01-17 MED ORDER — ASPIRIN EC 81 MG PO TBEC
81.0000 mg | DELAYED_RELEASE_TABLET | Freq: Every day | ORAL | Status: DC
  Administered 2016-01-17 – 2016-01-23 (×7): 81 mg via ORAL
  Filled 2016-01-17 (×7): qty 1

## 2016-01-17 MED ORDER — LEVOTHYROXINE SODIUM 50 MCG PO TABS
50.0000 ug | ORAL_TABLET | Freq: Every day | ORAL | Status: DC
  Administered 2016-01-17 – 2016-01-31 (×14): 50 ug via ORAL
  Filled 2016-01-17 (×14): qty 1

## 2016-01-17 MED ORDER — ENOXAPARIN SODIUM 40 MG/0.4ML ~~LOC~~ SOLN: 40.0000 mg | SUBCUTANEOUS | Status: DC

## 2016-01-17 MED ORDER — GI COCKTAIL ~~LOC~~
30.0000 mL | Freq: Four times a day (QID) | ORAL | Status: DC | PRN
  Administered 2016-01-21 – 2016-01-23 (×3): 30 mL via ORAL
  Filled 2016-01-17 (×3): qty 30

## 2016-01-17 MED ORDER — HYDROCODONE-ACETAMINOPHEN 5-325 MG PO TABS: 1.0000 | ORAL_TABLET | Freq: Four times a day (QID) | ORAL | Status: DC | PRN

## 2016-01-17 MED ORDER — KETOTIFEN FUMARATE 0.025 % OP SOLN: 1.0000 [drp] | Freq: Two times a day (BID) | OPHTHALMIC | Status: DC | PRN

## 2016-01-17 MED ORDER — HEPARIN (PORCINE) IN NACL 100-0.45 UNIT/ML-% IJ SOLN
1000.0000 [IU]/h | INTRAMUSCULAR | Status: DC
  Administered 2016-01-17 – 2016-01-20 (×3): 1000 [IU]/h via INTRAVENOUS
  Filled 2016-01-17 (×4): qty 250

## 2016-01-17 MED ORDER — CLOPIDOGREL BISULFATE 75 MG PO TABS
75.0000 mg | ORAL_TABLET | Freq: Every day | ORAL | Status: DC
  Administered 2016-01-17 – 2016-01-20 (×4): 75 mg via ORAL
  Filled 2016-01-17 (×4): qty 1

## 2016-01-17 MED ORDER — AMLODIPINE BESYLATE 5 MG PO TABS
5.0000 mg | ORAL_TABLET | Freq: Every day | ORAL | Status: DC
  Administered 2016-01-17 – 2016-01-23 (×7): 5 mg via ORAL
  Filled 2016-01-17 (×7): qty 1

## 2016-01-17 NOTE — ED Notes (Signed)
Dr. Smith at the bedside.  

## 2016-01-17 NOTE — H&P (Signed)
[facility] History and Physical  Jessica Taylor [id number] DOB: September 09, 1941 DOA: 01/16/2016  Referring physician: ED PCP: Jessica Kroner, MD   Chief Complaint: Chest pain  HPI:  Jessica Taylor is a 75 year old with past medical history significant for Hypothyroidism, HLD, CKD stage III, MI,  AAA,  PAD, CAD s/p PCI; who presents with complaints of chest pain. Symptoms started approximately 2 days ago (2/21) in the afternoon around 4:00-5:00 PM. She experienced acute sharp pain in the center of her chest that she also describes as tightness. Denies pain radiating, shortness of breath, nausea, vomiting, abdominal pain, or diaphoresis. She initially took aspirin and subsequently took a nitroglycerin when the aspirin did not relieve symptoms. She reports complete resolution of pain symptoms with nitroglycerin. Previous episodes when she's had chest pain in the past had never been fully resolved with the nitroglycerin so she did not think she needed to come in for evaluation at that time. However, she reports continued to experience episodes of chest pain every hour until approximately 10 PM at night . The following day she reports having at least 3 episodes each resolved with the nitroglycerin. This morning at 4 AM she had another episode that awoke her form her sleep. All other previous episodes had occurred during some kind of exertion. she became concerned as the events continued to happen and therefore decided to come and get evaluated.  Upon evaluation to the emergency department patient was seen have a relatively normal EKG and troponins 2 were negative.     Review of Systems  Constitutional: Negative for fever, chills, weight loss and malaise/fatigue.  HENT: Negative for ear discharge and ear pain.   Eyes: Negative for photophobia and pain.  Respiratory: Negative for hemoptysis and sputum production.   Cardiovascular: Positive for chest pain. Negative for palpitations and leg swelling.   Gastrointestinal: Negative for nausea, vomiting and abdominal pain.  Genitourinary: Negative for frequency and hematuria.  Musculoskeletal: Negative for back pain and neck pain.  Skin: Negative for itching and rash.  Neurological: Negative for tremors and sensory change.  Endo/Heme/Allergies: Negative for environmental allergies and polydipsia.  Psychiatric/Behavioral: Negative for hallucinations. The patient is not nervous/anxious.       Past Medical History  Diagnosis Date  . Acid reflux   . Hypothyroidism   . MI, old 36  . Hypercholesteremia   . Vertigo     when lays on left side.  . Cancer ([place])     basal cell on nose  . Anemia   . Coronary artery disease   . Hypertension   . Arthritis     knees , R shoulder - tx /w injection - 11/2014  . Tobacco abuse   . Abdominal aortic aneurysm ([place])   . Peripheral arterial disease (HCC)     hhigh-grade ostial bilateral calcified iliac stenosis with claudication     Past Surgical History  Procedure Laterality Date  . Coronary stent placement    . Cardiac catheterization  5 stents  . Tubal ligation    . Cataract extraction w/phaco Left 05/24/2014    Procedure: CATARACT EXTRACTION PHACO AND INTRAOCULAR LENS PLACEMENT (IOC);  Surgeon: Tonny Branch, MD;  Location: [facility];  Service: Ophthalmology;  Laterality: Left;  CDE:  9.30  . Cataract extraction w/phaco Right 06/18/2014    Procedure: CATARACT EXTRACTION PHACO AND INTRAOCULAR LENS PLACEMENT RIGHT EYE CDE=10.84;  Surgeon: Tonny Branch, MD;  Location: [facility];  Service: Ophthalmology;  Laterality: Right;  . Eye surgery    .  Tonsillectomy      age 32  . Partial knee arthroplasty Right 02/04/2015    Procedure: UNICOMPARTMENTAL KNEE;  Surgeon: Dorna Leitz, MD;  Location: [facility];  Service: Orthopedics;  Laterality: Right;  . Left heart catheterization with coronary angiogram N/A 03/06/2015    Procedure: LEFT HEART CATHETERIZATION WITH CORONARY ANGIOGRAM;  Surgeon: Lorretta Harp, MD;   Location: [facility] C[facility];  Service: Cardiovascular;  Laterality: N/A;  . Coronary stent placement  03/06/15    CFX DES  . Peripheral vascular catheterization Bilateral 05/13/2015    Procedure: Lower Extremity Angiography;  Surgeon: Lorretta Harp, MD;  Location: [facility];  Service: Cardiovascular;  Laterality: Bilateral;  . Peripheral vascular catheterization N/A 05/13/2015    Procedure: Abdominal Aortogram;  Surgeon: Lorretta Harp, MD;  Location: [place] [facility];  Service: Cardiovascular;  Laterality: N/A;  . Peripheral vascular catheterization Bilateral 06/27/2015    Procedure: Peripheral Vascular Intervention;  Surgeon: Lorretta Harp, MD;  Location: [place] [facility];  Service: Cardiovascular;  Laterality: Bilateral;  ILIACS  . Peripheral vascular catheterization Bilateral 06/27/2015    Procedure: Peripheral Vascular Atherectomy;  Surgeon: Lorretta Harp, MD;  Location: [place] [facility] LAB;  Service: Cardiovascular;  Laterality: Bilateral;      Social History:  reports that she quit smoking about 16 years ago. Her smoking use included Cigarettes. She has a 63 pack-year smoking history. She has never used smokeless tobacco. She reports that she does not drink alcohol or use illicit drugs. Where does patient live--home  Can patient participate in ADLs? Yes  Allergies  Allergen Reactions  . Shrimp [Shellfish Allergy] Nausea And Vomiting    Throws up violently    Family History  Problem Relation Age of Onset  . Cancer Mother 56  . Cancer Father 41  . Heart attack Neg Hx   . Stroke Maternal Grandfather   . Hypertension Maternal Grandmother   . Hypertension Son         Prior to Admission medications   Medication Sig Start Date End Date Taking? Authorizing Provider  amLODipine (NORVASC) 5 MG tablet Take 5 mg by mouth daily.  11/22/15  Yes Historical Provider, MD  aspirin EC 81 MG tablet Take 81 mg by mouth daily.    Yes Historical Provider, MD  atorvastatin  (LIPITOR) 40 MG tablet Take 1 tablet (40 mg total) by mouth daily. 07/18/15  Yes Jerline Pain, MD  clopidogrel (PLAVIX) 75 MG tablet Take 1 tablet (75 mg total) by mouth daily. 07/18/15  Yes Jerline Pain, MD  esomeprazole (NEXIUM) 40 MG capsule TAKE 1 CAPSULE BY MOUTH DAILY AT NOON. 12/27/15  Yes Jerline Pain, MD  HYDROcodone-acetaminophen (NORCO/VICODIN) 5-325 MG tablet Take 1 tablet by mouth every 6 (six) hours as needed for moderate pain (PAIN).   Yes Historical Provider, MD  Ketotifen Fumarate (THERA TEARS ALLERGY OP) Apply 1 drop to eye 2 (two) times daily as needed (dry eyes).   Yes Historical Provider, MD  levothyroxine (SYNTHROID, LEVOTHROID) 50 MCG tablet Take 50 mcg by mouth daily before breakfast.   Yes Historical Provider, MD  metoprolol tartrate (LOPRESSOR) 25 MG tablet TAKE 1 TABLET BY MOUTH TWICE DAILY. 10/21/15  Yes Burtis Junes, NP  nitroGLYCERIN (NITROSTAT) 0.4 MG SL tablet Place 1 tablet (0.4 mg total) under the tongue every 5 (five) minutes x 3 doses as needed for chest pain. 03/19/15  Yes Burtis Junes, NP  furosemide (LASIX) 20 MG tablet Take  one tablet by mouth daily except on Monday, Wednesday and Friday, take two tablets Patient not taking: Reported on 01/16/2016 09/09/15   Isaiah Serge, NP  potassium chloride (K-DUR) 10 MEQ tablet Take 1 tablet (10 mEq total) by mouth 2 (two) times daily. Take 1 tablet by mouth twice a day for 4 days then take 1 tablet by mouth 1 time a day Patient not taking: Reported on 01/16/2016 08/02/15   Isaiah Serge, NP     Physical Exam: Filed Vitals:   01/16/16 2330 01/17/16 0000 01/17/16 0030 01/17/16 0100  BP: 132/65 132/61 134/54 140/84  Pulse: 62 63 55 58  Temp:      TempSrc:      Resp: 24 18 17 22   SpO2: 94% 94% 93% 96%     Constitutional: Vital signs reviewed. Patient is a well-developed and well-nourished in no acute distress and cooperative with exam. Alert and oriented x3.  Head: Normocephalic and atraumatic  Ear: TM normal  bilaterally  Mouth: no erythema or exudates, MMM  Eyes: PERRL, EOMI, conjunctivae normal, No scleral icterus.  Neck: Supple, Trachea midline normal ROM, No JVD, mass, thyromegaly, or carotid bruit present.  Cardiovascular: RRR, S1 normal, S2 normal, no MRG, pulses symmetric and intact bilaterally  Pulmonary/Chest: CTAB, no wheezes, rales, or rhonchi  Abdominal: Soft. Non-tender, non-distended, bowel sounds are normal, no masses, organomegaly, or guarding present.  GU: no CVA tenderness Musculoskeletal: No joint deformities, erythema, or stiffness, ROM full and no nontender Ext: no edema and no cyanosis, pulses palpable bilaterally (DP and PT)  Hematology: no cervical, inginal, or axillary adenopathy.  Neurological: A&O x3, Strenght is normal and symmetric bilaterally, cranial nerve II-XII are grossly intact, no focal motor deficit, sensory intact to light touch bilaterally.  Skin: Warm, dry and intact. No rash, cyanosis, or clubbing.  Psychiatric: Normal mood and affect. speech and behavior is normal. Judgment and thought content normal. Cognition and memory are normal.      Data Review   Micro Results No results found for this or any previous visit (from the past 240 hour(s)).  Radiology Reports Dg Chest 2 View  01/16/2016  CLINICAL DATA:  Two-day history of chest pain. EXAM: CHEST  2 VIEW COMPARISON:  03/06/2015 FINDINGS: The lungs are clear wiithout focal pneumonia, edema, pneumothorax or pleural effusion. The cardiopericardial silhouette is within normal limits for size. The visualized bony structures of the thorax are intact. IMPRESSION: No active cardiopulmonary disease. Electronically Signed   By: Misty Stanley M.D.   On: 01/16/2016 20:20     CBC  Recent Labs Lab 01/16/16 2040  WBC 6.7  HGB 13.8  HCT 44.0  PLT 169  MCV 88.5  MCH 27.8  MCHC 31.4  RDW 15.4  LYMPHSABS 1.2  MONOABS 0.4  EOSABS 0.1  BASOSABS 0.0    Chemistries   Recent Labs Lab 01/16/16 2040  NA  142  K 4.8  CL 108  CO2 23  GLUCOSE 92  BUN 22*  CREATININE 1.37*  CALCIUM 9.6   ------------------------------------------------------------------------------------------------------------------ CrCl cannot be calculated (Unknown ideal weight.). ------------------------------------------------------------------------------------------------------------------ No results for input(s): HGBA1C in the last 72 hours. ------------------------------------------------------------------------------------------------------------------ No results for input(s): CHOL, HDL, LDLCALC, TRIG, CHOLHDL, LDLDIRECT in the last 72 hours. ------------------------------------------------------------------------------------------------------------------ No results for input(s): TSH, T4TOTAL, T3FREE, THYROIDAB in the last 72 hours.  Invalid input(s): FREET3 ------------------------------------------------------------------------------------------------------------------ No results for input(s): VITAMINB12, FOLATE, FERRITIN, TIBC, IRON, RETICCTPCT in the last 72 hours.  Coagulation profile No results for input(s): INR, PROTIME in the  last 168 hours.  No results for input(s): DDIMER in the last 72 hours.  Cardiac Enzymes No results for input(s): CKMB, TROPONINI, MYOGLOBIN in the last 168 hours.  Invalid input(s): CK ------------------------------------------------------------------------------------------------------------------ Invalid input(s): POCBNP   CBG: No results for input(s): GLUCAP in the last 168 hours.     EKG: Independently reviewed:sinus rhythm with left axis deviation similar to previous tracing Assessment/Plan  Chest pain/ Possible unstable angina: Acute. Patient with a significant past medical history of PAD, MI, CAD s/p PCI presents with acute chest pain symptoms over the last 2 days at rest and  with activity it appears .  Heart score 6. - Admit to telemetry bed  - Trend troponins  3 q 3hrs -  Heparin gtt per pharmacy - Repeat EKG in a.m.  - Echocardiogram  - NPO for a.m. for possible need a stress test  - Cardiology consulted  Coronary artery disease s/p PCI - Continue aspirin and Plavix   Hyperlipidemia - Check lipid panel a.m. - Continue atorvastatin   Hypothyroidism - Continue levothyroxine   Code Status:   full Family Communication: bedside Disposition Plan: admit   Total time spent 55 minutes.Greater than 50% of this time was spent in counseling, explanation of diagnosis, planning of further management, and coordination of care  [facility] Pager [phone]  If 7PM-7AM, please contact night-coverage [email or web address] Password [email or web address] 01/17/2016, 1:15 AM

## 2016-01-17 NOTE — ED Notes (Signed)
Jessica Taylor in sample provided. Shown to [facility] Cardiology PA, Verbal order received to stop Heparin.  Heparin Stopped.

## 2016-01-17 NOTE — Progress Notes (Addendum)
ANTICOAGULATION CONSULT NOTE -Follow up note   Pharmacy Consult for heparin Indication: chest pain/ACS  Allergies  Allergen Reactions  . Shrimp [Shellfish Allergy] Nausea And Vomiting    Throws up violently    Patient Measurements: Height: 5\' 4"  (162.6 cm) Weight: 167 lb 1.7 oz (75.8 kg) IBW/kg (Calculated) : 54.7 Heparin Dosing Weight: 70kg  Vital Signs: BP: 120/69 mmHg (02/24 1200) Pulse Rate: 51 (02/24 1200)  Labs:  Recent Labs  01/16/16 2040 01/17/16 0224 01/17/16 0531 01/17/16 0716 01/17/16 1140  HGB 13.8  --   --   --   --   HCT 44.0  --   --   --   --   PLT 169  --   --   --   --   LABPROT  --   --   --   --  14.9  INR  --   --   --   --  1.15  HEPARINUNFRC  --   --   --   --  0.76*  CREATININE 1.37*  --   --   --   --   TROPONINI  --  <0.03 <0.03 <0.03  --     Estimated Creatinine Clearance: 34.8 mL/min (by C-G formula based on Cr of 1.37).   Medical History: Past Medical History  Diagnosis Date  . Acid reflux   . Hypothyroidism   . 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 Hypercholesteremia   . Vertigo     when lays on left side.  . Cancer ([place])     basal cell on nose  . Anemia   . Coronary artery disease   . Hypertension   . Arthritis     knees , R shoulder - tx /w injection - 11/2014  . Tobacco abuse   . Abdominal aortic aneurysm ([place])   . Peripheral arterial disease (HCC)     hhigh-grade ostial bilateral calcified iliac stenosis with claudication     Assessment: 75 yo female admitted with Cp and unstable angina. Heparin turned off today at ~ 12:30 pm due to frank hematuria.   Goal of Therapy:  Heparin level 0.3-0.7 units/ml Monitor platelets by anticoagulation protocol: Yes   Plan:   Heparin currently off due to frank hematuria. Will cancel heparin level that was ordered for later this evening and f/u with cardiology on plans for further anticoagulation.   Vincenza Hews, PharmD, BCPS 01/17/2016, 1:32 PM Pager: [phone]

## 2016-01-17 NOTE — ED Notes (Signed)
Attempted report 

## 2016-01-17 NOTE — Progress Notes (Signed)
Patient Demographics:    Jessica Taylor, is a 75 y.o. female, DOB - [birth year], [id number]  Admit date - 01/16/2016   Admitting Physician Norval Morton, MD  Outpatient Primary MD for the patient is [name], MD  LOS -    Chief Complaint  Patient presents with  . Chest Pain        Subjective:    Jessica Taylor today has, No headache, No chest pain, No abdominal pain - No Nausea, No new weakness tingling or numbness, No Cough - SOB.    Assessment  & Plan :      1. Chest pain in a patient with CAD. Ruled out for MI with negative troponin 3, EKG nonacute, currently pain-free, on aspirin, Plavix, statin and beta blocker for secondary prevention, cardiology to evaluate.  2. Dyslipidemia. On high intensity statin, LDL is above goal, however she is 75 year old and female, will defer cardiology on increasing the dose of statin.  3. Hypothyroidism. Continue home dose Synthroid.  4. GERD. Continue PPI.  5. Essential hypertension. On Norvasc and beta blocker continue.  6.CKD 3 - creatinine close to baseline of 1.2.    Code Status : Full  Family Communication  : None  Disposition Plan  : Stay inpatient  Consults  : Cards  Procedures  :   DVT Prophylaxis  :    Heparin gtt  Lab Results  Component Value Date   PLT 169 01/16/2016    Inpatient Medications  Scheduled Meds: . amLODipine  5 mg Oral Daily  . aspirin EC  81 mg Oral Daily  . atorvastatin  40 mg Oral Daily  . clopidogrel  75 mg Oral Daily  . levothyroxine  50 mcg Oral QAC breakfast  . metoprolol tartrate  25 mg Oral BID  . pantoprazole  80 mg Oral Q1200   Continuous Infusions: . heparin 1,000 Units/hr (01/17/16 0226)   PRN Meds:.acetaminophen, gi cocktail, HYDROcodone-acetaminophen, ketotifen, morphine injection,  ondansetron (ZOFRAN) IV  Antibiotics  :     Anti-infectives    None        Objective:   Filed Vitals:   01/17/16 0230 01/17/16 0300 01/17/16 0400 01/17/16 0500  BP: 116/90 140/60 140/60 156/68  Pulse: 53 55 58 58  Temp:      TempSrc:      Resp: 16 20 15 17   Height:      Weight:      SpO2: 97% 97% 95% 95%    Wt Readings from Last 3 Encounters:  01/17/16 75.8 kg (167 lb 1.7 oz)  09/09/15 75.751 kg (167 lb)  08/02/15 74.277 kg (163 lb 12 oz)    No intake or output data in the 24 hours ending 01/17/16 0935   Physical Exam  Awake Alert, Oriented X 3, No new F.N deficits, Normal affect [place].AT,PERRAL Supple Neck,No JVD, No cervical lymphadenopathy appriciated.  Symmetrical Chest wall movement, Good air movement bilaterally, CTAB RRR,No Gallops,Rubs or new Murmurs, No Parasternal Heave +ve B.Sounds, Abd Soft, No tenderness, No organomegaly appriciated, No rebound - guarding or rigidity. No Cyanosis, Clubbing or edema, No new Rash or bruise       Data Review:   Micro Results No results found for this or any previous visit (from the  past 240 hour(s)).  Radiology Reports Dg Chest 2 View  01/16/2016  CLINICAL DATA:  Two-day history of chest pain. EXAM: CHEST  2 VIEW COMPARISON:  03/06/2015 FINDINGS: The lungs are clear wiithout focal pneumonia, edema, pneumothorax or pleural effusion. The cardiopericardial silhouette is within normal limits for size. The visualized bony structures of the thorax are intact. IMPRESSION: No active cardiopulmonary disease. Electronically Signed   By: Misty Stanley M.D.   On: 01/16/2016 20:20     CBC  Recent Labs Lab 01/16/16 2040  WBC 6.7  HGB 13.8  HCT 44.0  PLT 169  MCV 88.5  MCH 27.8  MCHC 31.4  RDW 15.4  LYMPHSABS 1.2  MONOABS 0.4  EOSABS 0.1  BASOSABS 0.0    Chemistries   Recent Labs Lab 01/16/16 2040  NA 142  K 4.8  CL 108  CO2 23  GLUCOSE 92  BUN 22*  CREATININE 1.37*  CALCIUM 9.6    ------------------------------------------------------------------------------------------------------------------  Recent Labs  01/17/16 0306  CHOL 177  HDL 36*  LDLCALC 110*  TRIG 156*  CHOLHDL 4.9    Lab Results  Component Value Date   HGBA1C 5.9* 03/06/2015   ------------------------------------------------------------------------------------------------------------------ No results for input(s): TSH, T4TOTAL, T3FREE, THYROIDAB in the last 72 hours.  Invalid input(s): FREET3 ------------------------------------------------------------------------------------------------------------------ No results for input(s): VITAMINB12, FOLATE, FERRITIN, TIBC, IRON, RETICCTPCT in the last 72 hours.  Coagulation profile No results for input(s): INR, PROTIME in the last 168 hours.  No results for input(s): DDIMER in the last 72 hours.  Cardiac Enzymes  Recent Labs Lab 01/17/16 0224 01/17/16 0531 01/17/16 0716  TROPONINI <0.03 <0.03 <0.03   ------------------------------------------------------------------------------------------------------------------    Component Value Date/Time   BNP 67.3 02/08/2015 2337    Time Spent in minutes  35   Jessica Taylor K M.D on 01/17/2016 at 9:35 AM  Between 7am to 7pm - Pager - [phone]  After 7pm go to [email or web address] - password [facility]  [facility] -  Office  [phone]

## 2016-01-17 NOTE — Progress Notes (Signed)
Spoke with Tarri Fuller PA concerning hematuria and IV heparin.  PA stated to resume heparin once urine cleared of blood.  Pharmacy updated.  Patient has not voided since conversation.  Night nurse informed to monitor urine for possibility to  resume heparin.  Sanda Linger

## 2016-01-17 NOTE — Care Management Obs Status (Signed)
[place] NOTIFICATION   Patient Details  Name: Jessica Taylor MRN: [id number] Date of Birth: [birth year]   Medicare Observation Status Notification Given:  Yes (Cp Cardiac enzymes negative)    Adelfa Koh Ocie Cornfield, RN 01/17/2016, 4:20 PM

## 2016-01-17 NOTE — ED Notes (Signed)
Cardiology PA at bedside. 

## 2016-01-17 NOTE — ED Notes (Signed)
Patient up ambulatory to the bathroom at this time

## 2016-01-17 NOTE — Plan of Care (Signed)
Problem: Education: Goal: Knowledge of [facility] Education information/materials will improve Outcome: Progressing Reviewed plan of care with patient in regards to plan Cath on Monday.  Discussed IV heparin and lab values with patient and family.

## 2016-01-17 NOTE — ED Notes (Signed)
Py ambulated to bathroom. Post BP elevated after ambulation.

## 2016-01-17 NOTE — Consult Note (Addendum)
Cardiologist: Skains/Berry(PAD)  Reason for Consult: Chest pain Referring Physician:   MIRABELLE CYPHERS is an 75 y.o. female.  HPI:  DESIRAY ORCHARD is a 75 y.o. female has had STEMI in April 2016.She had successful PCI to the mid circumflex in cath also revealed instent restenosis within the distal RCA proximal ramus and LAD stent which was treated medically. Her EF by cath was 35% by echo the following day 60%.  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 She had lifestyle limiting claudication. Dr. Adora Fridge referred her to Dr. Trula Slade for consideration of aortobifemoral bypass grafting however, he thought this was high risk especially in light of the fact that she was on [place] for her recently placed coronary stent and referred her back for percutaneous revascularization. Aneurysm measures approximately 3.3 cm. Dr. Adora Fridge performed percutaneous intervention on her iliac arteries 06/27/15 performing diamondback orbital rotational atherectomy, PTA and covered stenting using ICast covered stents.her symptoms completely resolved and her ABIs normalized.   Patient presents today with chest pain.  Chest pain began on Tuesday she describes it as sharp and has radiated up into the right side of her jaw. It is associated with exertion and improves after nitroglycerin. Had some shortness of breath and dizziness.   She says the pain feels the same as when she had her MI.   She has ruled out for MI and there are no ischemic EKG changes.    Cath results from February 2016 1. Left main; minor irregularities  2. LAD; there was 60% "in-stent restenosis" within the proximal LAD stent with a fairly focal 70% stenosis of the distal edge on a bend. 3. Left circumflex; the circumflex was the infarct-related artery and had total occlusion in the distal AV groove. There was a moderate  sized ramus branch that 70% "in-stent restenosis within the proximal stent..  4. Right coronary artery; dominant with 60% stenosis in the mid to distal portion. The distal stent had moderate in-stent restenosis in the 50% range with an 80% stenosis at the "crux just before the takeoff of the PDA. 5. Left ventriculography; RAO left ventriculogram was performed using  25 mL of Visipaque dye at 12 mL/second. The overall LVEF estimated  35-40% with severe inferior and lateral hypokinesia % With/Without wall motion abnormalities 6. Distal abdominal aortogram-small distal dominant aneurysm, 95% calcified ostial bilateral common iliac artery stenoses with moderate post stenotic dilatation in the right common iliac artery. There was a 50 mm transstenotic gradient.   Past Medical History  Diagnosis Date  . Acid reflux   . Hypothyroidism   . MI, old 50  . Hypercholesteremia   . Vertigo     when lays on left side.  . Cancer ([place])     basal cell on nose  . Anemia   . Coronary artery disease   . Hypertension   . Arthritis     knees , R shoulder - tx /w injection - 11/2014  . Tobacco abuse   . Abdominal aortic aneurysm ([place])   . Peripheral arterial disease (HCC)     hhigh-grade ostial bilateral calcified iliac stenosis with claudication    Past Surgical History  Procedure Laterality Date  . Coronary stent placement    . Cardiac catheterization  5 stents  . Tubal ligation    . Cataract extraction w/phaco Left 05/24/2014    Procedure: CATARACT EXTRACTION PHACO  AND INTRAOCULAR LENS PLACEMENT (IOC);  Surgeon: Tonny Branch, MD;  Location: [facility];  Service: Ophthalmology;  Laterality: Left;  CDE:  9.30  . Cataract extraction w/phaco Right 06/18/2014    Procedure: CATARACT EXTRACTION PHACO AND INTRAOCULAR LENS PLACEMENT RIGHT EYE CDE=10.84;  Surgeon: Tonny Branch, MD;  Location: [facility];  Service: Ophthalmology;  Laterality: Right;  . Eye surgery    . Tonsillectomy      age 20  . Partial knee  arthroplasty Right 02/04/2015    Procedure: UNICOMPARTMENTAL KNEE;  Surgeon: Dorna Leitz, MD;  Location: [place];  Service: Orthopedics;  Laterality: Right;  . Left heart catheterization with coronary angiogram N/A 03/06/2015    Procedure: LEFT HEART CATHETERIZATION WITH CORONARY ANGIOGRAM;  Surgeon: Lorretta Harp, MD;  Location: [facility] CATH LAB;  Service: Cardiovascular;  Laterality: N/A;  . Coronary stent placement  03/06/15    CFX DES  . Peripheral vascular catheterization Bilateral 05/13/2015    Procedure: Lower Extremity Angiography;  Surgeon: Lorretta Harp, MD;  Location: [place] [facility];  Service: Cardiovascular;  Laterality: Bilateral;  . Peripheral vascular catheterization N/A 05/13/2015    Procedure: Abdominal Aortogram;  Surgeon: Lorretta Harp, MD;  Location: [place] [facility] LAB;  Service: Cardiovascular;  Laterality: N/A;  . Peripheral vascular catheterization Bilateral 06/27/2015    Procedure: Peripheral Vascular Intervention;  Surgeon: Lorretta Harp, MD;  Location: [place] CV LAB;  Service: Cardiovascular;  Laterality: Bilateral;  ILIACS  . Peripheral vascular catheterization Bilateral 06/27/2015    Procedure: Peripheral Vascular Atherectomy;  Surgeon: Lorretta Harp, MD;  Location: [place] [facility] LAB;  Service: Cardiovascular;  Laterality: Bilateral;    Family History  Problem Relation Age of Onset  . Cancer Mother 46  . Cancer Father 84  . Heart attack Neg Hx   . Stroke Maternal Grandfather   . Hypertension Maternal Grandmother   . Hypertension Son     Social History:  reports that she quit smoking about 16 years ago. Her smoking use included Cigarettes. She has a 63 pack-year smoking history. She has never used smokeless tobacco. She reports that she does not drink alcohol or use illicit drugs.  Allergies:  Allergies  Allergen Reactions  . Shrimp [Shellfish Allergy] Nausea And Vomiting    Throws up violently    Medications:  Scheduled Meds: . amLODipine  5  mg Oral Daily  . aspirin EC  81 mg Oral Daily  . atorvastatin  40 mg Oral Daily  . clopidogrel  75 mg Oral Daily  . levothyroxine  50 mcg Oral QAC breakfast  . metoprolol tartrate  25 mg Oral BID  . pantoprazole  80 mg Oral Q1200   Continuous Infusions: . heparin 1,000 Units/hr (01/17/16 0226)   PRN Meds:.acetaminophen, gi cocktail, HYDROcodone-acetaminophen, ketotifen, morphine injection, ondansetron (ZOFRAN) IV   Results for orders placed or performed during the hospital encounter of 01/16/16 (from the past 48 hour(s))  CBC with Differential     Status: None   Collection Time: 01/16/16  8:40 PM  Result Value Ref Range   WBC 6.7 4.0 - 10.5 K/uL   RBC 4.97 3.87 - 5.11 MIL/uL   Hemoglobin 13.8 12.0 - 15.0 g/dL   HCT 44.0 36.0 - 46.0 %   MCV 88.5 78.0 - 100.0 fL   MCH 27.8 26.0 - 34.0 pg   MCHC 31.4 30.0 - 36.0 g/dL   RDW 15.4 11.5 - 15.5 %   Platelets 169 150 - 400 K/uL   Neutrophils  Relative % 74 %   Neutro Abs 4.9 1.7 - 7.7 K/uL   Lymphocytes Relative 18 %   Lymphs Abs 1.2 0.7 - 4.0 K/uL   Monocytes Relative 6 %   Monocytes Absolute 0.4 0.1 - 1.0 K/uL   Eosinophils Relative 2 %   Eosinophils Absolute 0.1 0.0 - 0.7 K/uL   Basophils Relative 0 %   Basophils Absolute 0.0 0.0 - 0.1 K/uL  Basic metabolic panel     Status: Abnormal   Collection Time: 01/16/16  8:40 PM  Result Value Ref Range   Sodium 142 135 - 145 mmol/L   Potassium 4.8 3.5 - 5.1 mmol/L   Chloride 108 101 - 111 mmol/L   CO2 23 22 - 32 mmol/L   Glucose, Bld 92 65 - 99 mg/dL   BUN 22 (H) 6 - 20 mg/dL   Creatinine, Ser 1.37 (H) 0.44 - 1.00 mg/dL   Calcium 9.6 8.9 - 10.3 mg/dL   GFR calc non Af Amer 36 (L) >60 mL/min   GFR calc Af Amer 42 (L) >60 mL/min    Comment: (NOTE) The eGFR has been calculated using the CKD EPI equation. This calculation has not been validated in all clinical situations. eGFR's persistently <60 mL/min signify possible Chronic Kidney Disease.    Anion gap 11 5 - 15  I-Stat  Troponin, ED - 0, 3, 6 hours (not at [facility])     Status: None   Collection Time: 01/16/16  8:48 PM  Result Value Ref Range   Troponin i, poc 0.00 0.00 - 0.08 ng/mL   Comment 3            Comment: Due to the release kinetics of cTnI, a negative result within the first hours of the onset of symptoms does not rule out myocardial infarction with certainty. If myocardial infarction is still suspected, repeat the test at appropriate intervals.   I-Stat Troponin, ED - 0, 3, 6 hours (not at [facility])     Status: None   Collection Time: 01/16/16 11:53 PM  Result Value Ref Range   Troponin i, poc 0.01 0.00 - 0.08 ng/mL   Comment 3            Comment: Due to the release kinetics of cTnI, a negative result within the first hours of the onset of symptoms does not rule out myocardial infarction with certainty. If myocardial infarction is still suspected, repeat the test at appropriate intervals.   Troponin I-serum (0, 3, 6 hours)     Status: None   Collection Time: 01/17/16  2:24 AM  Result Value Ref Range   Troponin I <0.03 <0.031 ng/mL    Comment:        NO INDICATION OF MYOCARDIAL INJURY.   Lipid panel     Status: Abnormal   Collection Time: 01/17/16  3:06 AM  Result Value Ref Range   Cholesterol 177 0 - 200 mg/dL   Triglycerides 156 (H) <150 mg/dL   HDL 36 (L) >40 mg/dL   Total CHOL/HDL Ratio 4.9 RATIO   VLDL 31 0 - 40 mg/dL   LDL Cholesterol 110 (H) 0 - 99 mg/dL    Comment:        Total Cholesterol/HDL:CHD Risk Coronary Heart Disease Risk Table                     Men   Women  1/2 Average Risk   3.4   3.3  Average Risk  5.0   4.4  2 X Average Risk   9.6   7.1  3 X Average Risk  23.4   11.0        Use the calculated Patient Ratio above and the CHD Risk Table to determine the patient's CHD Risk.        ATP III CLASSIFICATION (LDL):  <100     mg/dL   Optimal  100-129  mg/dL   Near or Above                    Optimal  130-159  mg/dL   Borderline  160-189  mg/dL   High   >190     mg/dL   Very High   Troponin I-serum (0, 3, 6 hours)     Status: None   Collection Time: 01/17/16  5:31 AM  Result Value Ref Range   Troponin I <0.03 <0.031 ng/mL    Comment:        NO INDICATION OF MYOCARDIAL INJURY.   Troponin I-serum (0, 3, 6 hours)     Status: None   Collection Time: 01/17/16  7:16 AM  Result Value Ref Range   Troponin I <0.03 <0.031 ng/mL    Comment:        NO INDICATION OF MYOCARDIAL INJURY.     Dg Chest 2 View  01/16/2016  CLINICAL DATA:  Two-day history of chest pain. EXAM: CHEST  2 VIEW COMPARISON:  03/06/2015 FINDINGS: The lungs are clear wiithout focal pneumonia, edema, pneumothorax or pleural effusion. The cardiopericardial silhouette is within normal limits for size. The visualized bony structures of the thorax are intact. IMPRESSION: No active cardiopulmonary disease. Electronically Signed   By: Misty Stanley M.D.   On: 01/16/2016 20:20    Review of Systems  Constitutional: Negative for fever, chills and diaphoresis.  HENT: Negative for congestion and sore throat.   Respiratory: Positive for shortness of breath. Negative for cough.   Cardiovascular: Negative for chest pain, palpitations, orthopnea, leg swelling and PND.  Gastrointestinal: Negative for nausea, vomiting, abdominal pain, blood in stool and melena.  Genitourinary: Positive for hematuria.  Musculoskeletal: Negative for myalgias.  Neurological: Positive for dizziness.  All other systems reviewed and are negative.  Blood pressure 124/67, pulse 52, temperature 98.1 F (36.7 C), temperature source Oral, resp. rate 18, height 5' 4" (1.626 m), weight 167 lb 1.7 oz (75.8 kg), SpO2 96 %. Physical Exam  Nursing note and vitals reviewed. Constitutional: She appears well-developed and well-nourished. No distress.  HENT:  Head: Normocephalic and atraumatic.  Mouth/Throat: No oropharyngeal exudate.  Eyes: EOM are normal. Pupils are equal, round, and reactive to light. No scleral  icterus.  Neck: Normal range of motion. Neck supple.  Cardiovascular: Normal rate, regular rhythm, S1 normal and S2 normal.  Exam reveals no friction rub.   No murmur heard. Pulses:      Radial pulses are 2+ on the right side, and 2+ on the left side.       Posterior tibial pulses are 1+ on the right side, and 1+ on the left side.  Both feet are nice and warm.  Respiratory: Effort normal and breath sounds normal. No respiratory distress. She has no wheezes. She has no rales.  GI: Soft. Bowel sounds are normal. She exhibits no distension. There is no tenderness.  Musculoskeletal: She exhibits no edema.  Lymphadenopathy:    She has no cervical adenopathy.  Neurological: She is alert. She exhibits normal muscle tone.  Skin: Skin is warm and dry.  Psychiatric: She has a normal mood and affect.    Assessment/Plan: Active Problems:   Mixed hyperlipidemia   Essential hypertension   CAD S/P prior PCI to LAD and RCA   Chest pain   Hematuria  Miss Pak's chest pain certainly is concerning for unstable angina. She reports it feels similar to when she had her previous MI weaned as intense. Appears to be related to exertion and nitroglycerin responsive. During her last heart catheterization proximal 11 months ago showed her circumflex stented due to an acute MI and she has diffuse in-stent restenosis in other arteries. Recommend left heart catheterization on Monday. Continue on IV heparin. She is currently pain-free. Continue aspirin, statin, Plavix, metoprolol and amlodipine.    Tarri Fuller, [place] 01/17/2016, 11:30 AM   I have personally seen and examined this patient with Tarri Fuller, PA-C. I agree with the assessment and plan as outlined above. She has known CAD with last cath April 2016 in setting of acute MI with occluded Circumflex. I have personally reviewed her cath images from 2016 as well as her EKG from this ED visit. She has known moderate disease in stented segment of other vessels.  She is being admitted with unstable angina. EKG without ischemic changes. Troponin negative. Agree with IV heparin while awaiting cath Monday. Continue other cardiac meds.   This consult involved complex decision making including extensive review of her CAD and planning for cardiac cath Monday.   MCALHANY,CHRISTOPHER 01/17/2016 12:15 PM

## 2016-01-17 NOTE — Progress Notes (Signed)
Echocardiogram 2D Echocardiogram has been performed.  Jessica Taylor 01/17/2016, 3:17 PM

## 2016-01-17 NOTE — Progress Notes (Signed)
ANTICOAGULATION CONSULT NOTE - Initial Consult  Pharmacy Consult for heparin Indication: chest pain/ACS  Allergies  Allergen Reactions  . Shrimp [Shellfish Allergy] Nausea And Vomiting    Throws up violently    Patient Measurements: Height: 5\' 4"  (162.6 cm) Weight: 167 lb 1.7 oz (75.8 kg) IBW/kg (Calculated) : 54.7 Heparin Dosing Weight: 70kg  Vital Signs: Temp: 98.1 F (36.7 C) (02/23 2034) Temp Source: Oral (02/23 2034) BP: 156/66 mmHg (02/24 0139) Pulse Rate: 57 (02/24 0139)  Labs:  Recent Labs  01/16/16 2040  HGB 13.8  HCT 44.0  PLT 169  CREATININE 1.37*    Estimated Creatinine Clearance: 34.8 mL/min (by C-G formula based on Cr of 1.37).   Medical History: Past Medical History  Diagnosis Date  . Acid reflux   . Hypothyroidism   . MI, old 50  . Hypercholesteremia   . Vertigo     when lays on left side.  . Cancer ([place])     basal cell on nose  . Anemia   . Coronary artery disease   . Hypertension   . Arthritis     knees , R shoulder - tx /w injection - 11/2014  . Tobacco abuse   . Abdominal aortic aneurysm ([place])   . Peripheral arterial disease (HCC)     hhigh-grade ostial bilateral calcified iliac stenosis with claudication     Assessment: 75yo female c/o CP since yesterday, took NTG x7 yesterday and x4 today w/ temporary relief after each dose, initial i-stat troponin negative, to begin heparin.  Goal of Therapy:  Heparin level 0.3-0.7 units/ml Monitor platelets by anticoagulation protocol: Yes   Plan:  Will give heparin 3000 units IV bolus x1 followed by gtt at 1000 units/hr and monitor heparin levels and CBC.  Wynona Neat, PharmD, BCPS  01/17/2016,2:05 AM

## 2016-01-17 NOTE — ED Notes (Addendum)
Pt reports hematuria.  Dr. Candiss Norse paged, states need to notify Pharmacy for PT/INR monitoring and Cardiology for Heparin monitoring.  Spoke with [facility] PA, orders placed for UA and PT/INR

## 2016-01-18 DIAGNOSIS — Z955 Presence of coronary angioplasty implant and graft: Secondary | ICD-10-CM

## 2016-01-18 DIAGNOSIS — E782 Mixed hyperlipidemia: Secondary | ICD-10-CM

## 2016-01-18 DIAGNOSIS — I10 Essential (primary) hypertension: Secondary | ICD-10-CM

## 2016-01-18 DIAGNOSIS — I209 Angina pectoris, unspecified: Secondary | ICD-10-CM | POA: Diagnosis not present

## 2016-01-18 DIAGNOSIS — I200 Unstable angina: Secondary | ICD-10-CM | POA: Diagnosis not present

## 2016-01-18 DIAGNOSIS — I2510 Atherosclerotic heart disease of native coronary artery without angina pectoris: Secondary | ICD-10-CM | POA: Diagnosis not present

## 2016-01-18 LAB — HEPARIN LEVEL (UNFRACTIONATED): HEPARIN UNFRACTIONATED: 0.2 [IU]/mL — AB (ref 0.30–0.70)

## 2016-01-18 LAB — CBC
HEMATOCRIT: 42.1 % (ref 36.0–46.0)
HEMOGLOBIN: 13.1 g/dL (ref 12.0–15.0)
MCH: 27.3 pg (ref 26.0–34.0)
MCHC: 31.1 g/dL (ref 30.0–36.0)
MCV: 87.9 fL (ref 78.0–100.0)
Platelets: 161 10*3/uL (ref 150–400)
RBC: 4.79 MIL/uL (ref 3.87–5.11)
RDW: 15.4 % (ref 11.5–15.5)
WBC: 5.7 10*3/uL (ref 4.0–10.5)

## 2016-01-18 LAB — PROTIME-INR
INR: 1.09 (ref 0.00–1.49)
PROTHROMBIN TIME: 14.3 s (ref 11.6–15.2)

## 2016-01-18 MED ORDER — ATORVASTATIN CALCIUM 80 MG PO TABS
80.0000 mg | ORAL_TABLET | Freq: Every day | ORAL | Status: DC
  Administered 2016-01-19 – 2016-01-31 (×12): 80 mg via ORAL
  Filled 2016-01-18 (×6): qty 1
  Filled 2016-01-18: qty 2
  Filled 2016-01-18 (×5): qty 1

## 2016-01-18 NOTE — Progress Notes (Signed)
Patient Demographics:    Jessica Taylor, is a 75 y.o. female, DOB - 1940-12-13, [id number]  Admit date - 01/16/2016   Admitting Physician Norval Morton, MD  Outpatient Primary MD for the patient is [name], MD  LOS -    Chief Complaint  Patient presents with  . Chest Pain        Subjective:    Jessica Taylor today has, No headache, No chest pain, No abdominal pain - No Nausea, No new weakness tingling or numbness, No Cough - SOB.    Assessment  & Plan :      1. Chest pain in a patient with CAD. Ruled out for MI with negative troponin 3, EKG nonacute, currently pain-free, on aspirin, Plavix, statin and beta blocker for secondary prevention, cardiology following due for left heart cath on 01/20/2016. We'll defer management of this problem to cardiology.  2. Dyslipidemia. On high intensity statin, LDL is above goal, however she is 75 year old and female, will defer cardiology on increasing the dose of statin.  3. Hypothyroidism. Continue home dose Synthroid.  4. GERD. Continue PPI.  5. Essential hypertension. On Norvasc and beta blocker continue.  6.CKD 3 - creatinine close to baseline of 1.2.    Code Status : Full  Family Communication  : None  Disposition Plan  : Stay inpatient  Consults  : Cards  Procedures  :   DVT Prophylaxis  :    Heparin gtt  Lab Results  Component Value Date   PLT 161 01/18/2016    Inpatient Medications  Scheduled Meds: . amLODipine  5 mg Oral Daily  . aspirin EC  81 mg Oral Daily  . atorvastatin  40 mg Oral Daily  . clopidogrel  75 mg Oral Daily  . levothyroxine  50 mcg Oral QAC breakfast  . metoprolol tartrate  25 mg Oral BID  . pantoprazole  80 mg Oral Q1200   Continuous Infusions: . heparin 800 Units/hr (01/18/16 0423)   PRN  Meds:.acetaminophen, gi cocktail, HYDROcodone-acetaminophen, ketotifen, morphine injection, ondansetron (ZOFRAN) IV  Antibiotics  :     Anti-infectives    None        Objective:   Filed Vitals:   01/17/16 2121 01/18/16 0457 01/18/16 0827 01/18/16 0830  BP:  145/61 150/70   Pulse: 61 54  60  Temp:  98.2 F (36.8 C)    TempSrc:  Oral    Resp:  18    Height:      Weight:  75.6 kg (166 lb 10.7 oz)    SpO2:  95%      Wt Readings from Last 3 Encounters:  01/18/16 75.6 kg (166 lb 10.7 oz)  09/09/15 75.751 kg (167 lb)  08/02/15 74.277 kg (163 lb 12 oz)     Intake/Output Summary (Last 24 hours) at 01/18/16 1142 Last data filed at 01/18/16 0400  Gross per 24 hour  Intake    240 ml  Output    300 ml  Net    -60 ml     Physical Exam  Awake Alert, Oriented X 3, No new F.N deficits, Normal affect [place].AT,PERRAL Supple Neck,No JVD, No cervical lymphadenopathy appriciated.  Symmetrical Chest wall movement, Good air movement bilaterally, CTAB RRR,No Gallops,Rubs or  new Murmurs, No Parasternal Heave +ve B.Sounds, Abd Soft, No tenderness, No organomegaly appriciated, No rebound - guarding or rigidity. No Cyanosis, Clubbing or edema, No new Rash or bruise       Data Review:   Micro Results No results found for this or any previous visit (from the past 240 hour(s)).  Radiology Reports Dg Chest 2 View  01/16/2016  CLINICAL DATA:  Two-day history of chest pain. EXAM: CHEST  2 VIEW COMPARISON:  03/06/2015 FINDINGS: The lungs are clear wiithout focal pneumonia, edema, pneumothorax or pleural effusion. The cardiopericardial silhouette is within normal limits for size. The visualized bony structures of the thorax are intact. IMPRESSION: No active cardiopulmonary disease. Electronically Signed   By: Misty Stanley M.D.   On: 01/16/2016 20:20     CBC  Recent Labs Lab 01/16/16 2040 01/18/16 0525  WBC 6.7 5.7  HGB 13.8 13.1  HCT 44.0 42.1  PLT 169 161  MCV 88.5 87.9  MCH 27.8  27.3  MCHC 31.4 31.1  RDW 15.4 15.4  LYMPHSABS 1.2  --   MONOABS 0.4  --   EOSABS 0.1  --   BASOSABS 0.0  --     Chemistries   Recent Labs Lab 01/16/16 2040  NA 142  K 4.8  CL 108  CO2 23  GLUCOSE 92  BUN 22*  CREATININE 1.37*  CALCIUM 9.6   ------------------------------------------------------------------------------------------------------------------  Recent Labs  01/17/16 0306  CHOL 177  HDL 36*  LDLCALC 110*  TRIG 156*  CHOLHDL 4.9    Lab Results  Component Value Date   HGBA1C 5.9* 03/06/2015   ------------------------------------------------------------------------------------------------------------------ No results for input(s): TSH, T4TOTAL, T3FREE, THYROIDAB in the last 72 hours.  Invalid input(s): FREET3 ------------------------------------------------------------------------------------------------------------------ No results for input(s): VITAMINB12, FOLATE, FERRITIN, TIBC, IRON, RETICCTPCT in the last 72 hours.  Coagulation profile  Recent Labs Lab 01/17/16 1140 01/18/16 0525  INR 1.15 1.09    No results for input(s): DDIMER in the last 72 hours.  Cardiac Enzymes  Recent Labs Lab 01/17/16 0224 01/17/16 0531 01/17/16 0716  TROPONINI <0.03 <0.03 <0.03   ------------------------------------------------------------------------------------------------------------------    Component Value Date/Time   BNP 67.3 02/08/2015 2337    Time Spent in minutes  35   SINGH,PRASHANT K M.D on 01/18/2016 at 11:42 AM  Between 7am to 7pm - Pager - [phone]  After 7pm go to [email or web address] - password [facility]  [facility] -  Office  [phone]

## 2016-01-18 NOTE — Progress Notes (Signed)
ANTICOAGULATION CONSULT NOTE - Follow Up Consult  Pharmacy Consult for heparin Indication: chest pain/ACS   Labs:  Recent Labs  01/16/16 2040 01/17/16 0224 01/17/16 0531 01/17/16 0716 01/17/16 1140  HGB 13.8  --   --   --   --   HCT 44.0  --   --   --   --   PLT 169  --   --   --   --   LABPROT  --   --   --   --  14.9  INR  --   --   --   --  1.15  HEPARINUNFRC  --   --   --   --  0.76*  CREATININE 1.37*  --   --   --   --   TROPONINI  --  <0.03 <0.03 <0.03  --      Assessment: 75yo female had been started on heparin but stopped 2/2 frank hematuria; RN reports that urine is now clear.  Goal of Therapy:  Heparin level 0.3-0.7 units/ml   Plan:  Will resume heparin gtt at lower rate of 800 units/hr and check level in [place], PharmD, BCPS  01/18/2016,4:06 AM

## 2016-01-18 NOTE — Progress Notes (Signed)
SUBJECTIVE: No chest pain at rest, mild discomfort with exertion.     Intake/Output Summary (Last 24 hours) at 01/18/16 1234 Last data filed at 01/18/16 0400  Gross per 24 hour  Intake    240 ml  Output    300 ml  Net    -60 ml    Current Facility-Administered Medications  Medication Dose Route Frequency Provider Last Rate Last Dose  . acetaminophen (TYLENOL) tablet 650 mg  650 mg Oral Q4H PRN Norval Morton, MD      . amLODipine (NORVASC) tablet 5 mg  5 mg Oral Daily Norval Morton, MD   5 mg at 01/18/16 0827  . aspirin EC tablet 81 mg  81 mg Oral Daily Norval Morton, MD   81 mg at 01/18/16 0826  . atorvastatin (LIPITOR) tablet 40 mg  40 mg Oral Daily Norval Morton, MD   40 mg at 01/18/16 0829  . clopidogrel (PLAVIX) tablet 75 mg  75 mg Oral Daily Norval Morton, MD   75 mg at 01/18/16 0829  . gi cocktail (Maalox,Lidocaine,Donnatal)  30 mL Oral QID PRN Norval Morton, MD      . heparin ADULT infusion 100 units/mL (25000 units/250 mL)  800 Units/hr Intravenous Continuous Laren Everts, RPH 8 mL/hr at 01/18/16 0423 800 Units/hr at 01/18/16 0423  . HYDROcodone-acetaminophen (NORCO/VICODIN) 5-325 MG per tablet 1 tablet  1 tablet Oral Q6H PRN Norval Morton, MD      . ketotifen (ZADITOR) 0.025 % ophthalmic solution 1 drop  1 drop Both Eyes BID PRN Norval Morton, MD      . levothyroxine (SYNTHROID, LEVOTHROID) tablet 50 mcg  50 mcg Oral QAC breakfast Norval Morton, MD   50 mcg at 01/18/16 0829  . metoprolol tartrate (LOPRESSOR) tablet 25 mg  25 mg Oral BID Norval Morton, MD   25 mg at 01/18/16 0830  . morphine 2 MG/ML injection 2 mg  2 mg Intravenous Q2H PRN Norval Morton, MD      . ondansetron (ZOFRAN) injection 4 mg  4 mg Intravenous Q6H PRN Norval Morton, MD      . pantoprazole (PROTONIX) EC tablet 80 mg  80 mg Oral Q1200 Norval Morton, MD   80 mg at 01/17/16 1218    Filed Vitals:   01/17/16 2121 01/18/16 0457 01/18/16 0827 01/18/16 0830  BP:  145/61  150/70   Pulse: 61 54  60  Temp:  98.2 F (36.8 C)    TempSrc:  Oral    Resp:  18    Height:      Weight:  166 lb 10.7 oz (75.6 kg)    SpO2:  95%      PHYSICAL EXAM General: NAD HEENT: Normal. Neck: No JVD, no thyromegaly.  Lungs: Clear to auscultation bilaterally with normal respiratory effort. CV: Nondisplaced PMI.  Regular rate and rhythm, normal S1/S2, no S3/S4, no murmur.  No pretibial edema.  Abdomen: Soft, nontender, no distention.  Neurologic: Alert and oriented x 3.  Psych: Normal affect. Musculoskeletal: No gross deformities. Extremities: No clubbing or cyanosis.     LABS: Basic Metabolic Panel:  Recent Labs  01/16/16 2040  NA 142  K 4.8  CL 108  CO2 23  GLUCOSE 92  BUN 22*  CREATININE 1.37*  CALCIUM 9.6   Liver Function Tests: No results for input(s): AST, ALT, ALKPHOS, BILITOT, PROT, ALBUMIN in the last 72 hours. No  results for input(s): LIPASE, AMYLASE in the last 72 hours. CBC:  Recent Labs  01/16/16 2040 01/18/16 0525  WBC 6.7 5.7  NEUTROABS 4.9  --   HGB 13.8 13.1  HCT 44.0 42.1  MCV 88.5 87.9  PLT 169 161   Cardiac Enzymes:  Recent Labs  01/17/16 0224 01/17/16 0531 01/17/16 0716  TROPONINI <0.03 <0.03 <0.03   BNP: Invalid input(s): POCBNP D-Dimer: No results for input(s): DDIMER in the last 72 hours. Hemoglobin A1C: No results for input(s): HGBA1C in the last 72 hours. Fasting Lipid Panel:  Recent Labs  01/17/16 0306  CHOL 177  HDL 36*  LDLCALC 110*  TRIG 156*  CHOLHDL 4.9   Thyroid Function Tests: No results for input(s): TSH, T4TOTAL, T3FREE, THYROIDAB in the last 72 hours.  Invalid input(s): FREET3 Anemia Panel: No results for input(s): VITAMINB12, FOLATE, FERRITIN, TIBC, IRON, RETICCTPCT in the last 72 hours.  RADIOLOGY: Dg Chest 2 View  01/16/2016  CLINICAL DATA:  Two-day history of chest pain. EXAM: CHEST  2 VIEW COMPARISON:  03/06/2015 FINDINGS: The lungs are clear wiithout focal pneumonia, edema,  pneumothorax or pleural effusion. The cardiopericardial silhouette is within normal limits for size. The visualized bony structures of the thorax are intact. IMPRESSION: No active cardiopulmonary disease. Electronically Signed   By: Misty Stanley M.D.   On: 01/16/2016 20:20      ASSESSMENT AND PLAN: 1. Unstable angina: She has known CAD with last cath April 2016 in setting of acute MI with occluded Circumflex. She has known moderate disease in stented segment of other vessels. EKG without ischemic changes. Troponin negative. Continue IV heparin while awaiting cath Monday. Continue ASA, Lipitor, Plavix, and metoprolol.  2. Essential HTN: Elevated. Will consider increasing amlodipine if persistently elevated.  3. Hyperlipidemia: LDL 110. Increase Lipitor to 80 mg.   Kate Sable, M.D., F.A.C.C.

## 2016-01-18 NOTE — Progress Notes (Signed)
ANTICOAGULATION CONSULT NOTE - Follow Up Consult  Pharmacy Consult for Heparin Indication: chest pain/ACS  Allergies  Allergen Reactions  . Shrimp [Shellfish Allergy] Nausea And Vomiting    Throws up violently    Patient Measurements: Height: 5\' 3"  (160 cm) Weight: 166 lb 10.7 oz (75.6 kg) IBW/kg (Calculated) : 52.4 Heparin Dosing Weight: 70 kg  Vital Signs: BP: 150/70 mmHg (02/25 0827) Pulse Rate: 60 (02/25 0830)  Labs:  Recent Labs  01/16/16 2040 01/17/16 0224 01/17/16 0531 01/17/16 0716 01/17/16 1140 01/18/16 0525 01/18/16 1156 01/18/16 1931  HGB 13.8  --   --   --   --  13.1  --   --   HCT 44.0  --   --   --   --  42.1  --   --   PLT 169  --   --   --   --  161  --   --   LABPROT  --   --   --   --  14.9 14.3  --   --   INR  --   --   --   --  1.15 1.09  --   --   HEPARINUNFRC  --   --   --   --  0.76*  --  <0.10* 0.20*  CREATININE 1.37*  --   --   --   --   --   --   --   TROPONINI  --  <0.03 <0.03 <0.03  --   --   --   --     Estimated Creatinine Clearance: 34 mL/min (by C-G formula based on Cr of 1.37).   Medications:  Scheduled:  . amLODipine  5 mg Oral Daily  . aspirin EC  81 mg Oral Daily  . [START ON 01/19/2016] atorvastatin  80 mg Oral Daily  . clopidogrel  75 mg Oral Daily  . levothyroxine  50 mcg Oral QAC breakfast  . metoprolol tartrate  25 mg Oral BID  . pantoprazole  80 mg Oral Q1200   Infusions:  . heparin 950 Units/hr (01/18/16 1430)    Assessment: 75 yo F with hx CAD presented to the ED 2/24 with recurrent CP.  Pt was started on heparin infusion with plans for cardiac cath Monday.  Heparin level remains low on heparin increased to 950 units/hr.  Pt was started on a low rate because of hematuria yesterday at higher rate.  Hematuria has now resolved and urine remains clear. No bleed/IV line issues per RN.  Goal of Therapy:  Heparin level 0.3-0.7 units/ml Monitor platelets by anticoagulation protocol: Yes   Plan:  Increase heparin  to 1100 units/hr Heparin level in 8 hours Heparin level and CBC daily Monitor s/sx bleeding  Elicia Lamp, PharmD, [facility] Clinical Pharmacist Pager [phone] 01/18/2016 8:24 PM

## 2016-01-18 NOTE — Progress Notes (Signed)
ANTICOAGULATION CONSULT NOTE - Follow Up Consult  Pharmacy Consult for Heparin Indication: chest pain/ACS  Allergies  Allergen Reactions  . Shrimp [Shellfish Allergy] Nausea And Vomiting    Throws up violently    Patient Measurements: Height: 5\' 3"  (160 cm) Weight: 166 lb 10.7 oz (75.6 kg) IBW/kg (Calculated) : 52.4 Heparin Dosing Weight: 70 kg  Vital Signs: Temp: 98.2 F (36.8 C) (02/25 0457) Temp Source: Oral (02/25 0457) BP: 150/70 mmHg (02/25 0827) Pulse Rate: 60 (02/25 0830)  Labs:  Recent Labs  01/16/16 2040 01/17/16 0224 01/17/16 0531 01/17/16 0716 01/17/16 1140 01/18/16 0525 01/18/16 1156  HGB 13.8  --   --   --   --  13.1  --   HCT 44.0  --   --   --   --  42.1  --   PLT 169  --   --   --   --  161  --   LABPROT  --   --   --   --  14.9 14.3  --   INR  --   --   --   --  1.15 1.09  --   HEPARINUNFRC  --   --   --   --  0.76*  --  <0.10*  CREATININE 1.37*  --   --   --   --   --   --   TROPONINI  --  <0.03 <0.03 <0.03  --   --   --     Estimated Creatinine Clearance: 34 mL/min (by C-G formula based on Cr of 1.37).   Medications:  Scheduled:  . amLODipine  5 mg Oral Daily  . aspirin EC  81 mg Oral Daily  . [START ON 01/19/2016] atorvastatin  80 mg Oral Daily  . clopidogrel  75 mg Oral Daily  . levothyroxine  50 mcg Oral QAC breakfast  . metoprolol tartrate  25 mg Oral BID  . pantoprazole  80 mg Oral Q1200   Infusions:  . heparin 800 Units/hr (01/18/16 0423)    Assessment: 75 yo F with hx CAD presented to the ED 2/24 with recurrent CP.  Pt was started on heparin infusion with plans for cardiac cath Monday.  Heparin level is now undetectable on heparin at 800 units/hr.  Pt was started on a low rate because it hematuria yesterday at higher rate.  Hematuria has now resolved and urine remains clear.  Will increase infusion conservatively.  Goal of Therapy:  Heparin level 0.3-0.7 units/ml Monitor platelets by anticoagulation protocol: Yes   Plan:   Increase heparin to 950 units/hr Heparin level in 6 hours Heparin level and CBC daily  [employer], Pharm.D., BCPS Clinical Pharmacist Pager [phone] 01/18/2016 1:53 PM

## 2016-01-19 DIAGNOSIS — I200 Unstable angina: Secondary | ICD-10-CM

## 2016-01-19 DIAGNOSIS — I10 Essential (primary) hypertension: Secondary | ICD-10-CM | POA: Diagnosis not present

## 2016-01-19 DIAGNOSIS — I2510 Atherosclerotic heart disease of native coronary artery without angina pectoris: Secondary | ICD-10-CM | POA: Diagnosis not present

## 2016-01-19 DIAGNOSIS — E782 Mixed hyperlipidemia: Secondary | ICD-10-CM | POA: Diagnosis not present

## 2016-01-19 DIAGNOSIS — I209 Angina pectoris, unspecified: Secondary | ICD-10-CM

## 2016-01-19 LAB — PROTIME-INR
INR: 1.02 (ref 0.00–1.49)
Prothrombin Time: 13.6 seconds (ref 11.6–15.2)

## 2016-01-19 LAB — HEPARIN LEVEL (UNFRACTIONATED)
HEPARIN UNFRACTIONATED: 0.8 [IU]/mL — AB (ref 0.30–0.70)
HEPARIN UNFRACTIONATED: 0.65 [IU]/mL (ref 0.30–0.70)

## 2016-01-19 LAB — CBC
HCT: 44.4 % (ref 36.0–46.0)
Hemoglobin: 13.5 g/dL (ref 12.0–15.0)
MCH: 26.8 pg (ref 26.0–34.0)
MCHC: 30.4 g/dL (ref 30.0–36.0)
MCV: 88.1 fL (ref 78.0–100.0)
PLATELETS: 171 10*3/uL (ref 150–400)
RBC: 5.04 MIL/uL (ref 3.87–5.11)
RDW: 15.7 % — ABNORMAL HIGH (ref 11.5–15.5)
WBC: 5.9 10*3/uL (ref 4.0–10.5)

## 2016-01-19 NOTE — Progress Notes (Signed)
ANTICOAGULATION CONSULT NOTE - Follow Up Consult  Pharmacy Consult for heparin Indication: USAP   Labs:  Recent Labs  01/16/16 2040 01/17/16 0224 01/17/16 0531 01/17/16 0716  01/17/16 1140 01/18/16 0525 01/18/16 1156 01/18/16 1931 01/19/16 0451  HGB 13.8  --   --   --   --   --  13.1  --   --  13.5  HCT 44.0  --   --   --   --   --  42.1  --   --  44.4  PLT 169  --   --   --   --   --  161  --   --  171  LABPROT  --   --   --   --   --  14.9 14.3  --   --  13.6  INR  --   --   --   --   --  1.15 1.09  --   --  1.02  HEPARINUNFRC  --   --   --   --   < > 0.76*  --  <0.10* 0.20* 0.80*  CREATININE 1.37*  --   --   --   --   --   --   --   --   --   TROPONINI  --  <0.03 <0.03 <0.03  --   --   --   --   --   --   < > = values in this interval not displayed.    Assessment: 75yo female now supratherapeutic on heparin after rate increase.  Goal of Therapy:  Heparin level 0.3-0.7 units/ml   Plan:  Will decrease heparin gtt by 1-2 units/kg/hr to 1000 units/hr and check level in [place], PharmD, BCPS  01/19/2016,5:29 AM

## 2016-01-19 NOTE — Progress Notes (Signed)
ANTICOAGULATION CONSULT NOTE - Follow Up Consult  Pharmacy Consult for Heparin Indication: chest pain/ACS  Allergies  Allergen Reactions  . Shrimp [Shellfish Allergy] Nausea And Vomiting    Throws up violently    Patient Measurements: Height: 5\' 3"  (160 cm) Weight: 166 lb 9.6 oz (75.569 kg) IBW/kg (Calculated) : 52.4 Heparin Dosing Weight: 70 kg  Vital Signs: Temp: 98.1 F (36.7 C) (02/26 1505) Temp Source: Oral (02/26 1505) BP: 124/54 mmHg (02/26 1505) Pulse Rate: 48 (02/26 1505)  Labs:  Recent Labs  01/16/16 2040 01/17/16 0224 01/17/16 0531 01/17/16 0716 01/17/16 1140 01/18/16 0525  01/18/16 1931 01/19/16 0451 01/19/16 1422  HGB 13.8  --   --   --   --  13.1  --   --  13.5  --   HCT 44.0  --   --   --   --  42.1  --   --  44.4  --   PLT 169  --   --   --   --  161  --   --  171  --   LABPROT  --   --   --   --  14.9 14.3  --   --  13.6  --   INR  --   --   --   --  1.15 1.09  --   --  1.02  --   HEPARINUNFRC  --   --   --   --  0.76*  --   < > 0.20* 0.80* 0.65  CREATININE 1.37*  --   --   --   --   --   --   --   --   --   TROPONINI  --  <0.03 <0.03 <0.03  --   --   --   --   --   --   < > = values in this interval not displayed.  Estimated Creatinine Clearance: 34 mL/min (by C-G formula based on Cr of 1.37).   Medications:  Scheduled:  . amLODipine  5 mg Oral Daily  . aspirin EC  81 mg Oral Daily  . atorvastatin  80 mg Oral Daily  . clopidogrel  75 mg Oral Daily  . levothyroxine  50 mcg Oral QAC breakfast  . metoprolol tartrate  25 mg Oral BID  . pantoprazole  80 mg Oral Q1200   Infusions:  . heparin 1,000 Units/hr (01/19/16 [id number])    Assessment: 75 yo F with hx CAD presented to the ED 2/24 with recurrent CP.  Pt was started on heparin infusion with plans for cardiac cath Monday. Heparin level is now therapeutic. No further bleeding noted.   Goal of Therapy:  Heparin level 0.3-0.7 units/ml Monitor platelets by anticoagulation protocol: Yes    Plan:  - Continue heparin gtt at 1000 units/hr - F/u AM heparin level to confirm dosing  Salome Arnt, PharmD, BCPS Pager # [phone] 01/19/2016 3:45 PM

## 2016-01-19 NOTE — Progress Notes (Signed)
SUBJECTIVE: Denies chest pain.     Intake/Output Summary (Last 24 hours) at 01/19/16 1208 Last data filed at 01/19/16 0908  Gross per 24 hour  Intake 578.59 ml  Output      0 ml  Net 578.59 ml    Current Facility-Administered Medications  Medication Dose Route Frequency Provider Last Rate Last Dose  . acetaminophen (TYLENOL) tablet 650 mg  650 mg Oral Q4H PRN Norval Morton, MD      . amLODipine (NORVASC) tablet 5 mg  5 mg Oral Daily Norval Morton, MD   5 mg at 01/19/16 0743  . aspirin EC tablet 81 mg  81 mg Oral Daily Norval Morton, MD   81 mg at 01/19/16 0745  . atorvastatin (LIPITOR) tablet 80 mg  80 mg Oral Daily Herminio Commons, MD   80 mg at 01/19/16 0746  . clopidogrel (PLAVIX) tablet 75 mg  75 mg Oral Daily Norval Morton, MD   75 mg at 01/19/16 0745  . gi cocktail (Maalox,Lidocaine,Donnatal)  30 mL Oral QID PRN Norval Morton, MD      . heparin ADULT infusion 100 units/mL (25000 units/250 mL)  1,000 Units/hr Intravenous Continuous Laren Everts, RPH 10 mL/hr at 01/19/16 0923 1,000 Units/hr at 01/19/16 0923  . HYDROcodone-acetaminophen (NORCO/VICODIN) 5-325 MG per tablet 1 tablet  1 tablet Oral Q6H PRN Norval Morton, MD      . ketotifen (ZADITOR) 0.025 % ophthalmic solution 1 drop  1 drop Both Eyes BID PRN Norval Morton, MD      . levothyroxine (SYNTHROID, LEVOTHROID) tablet 50 mcg  50 mcg Oral QAC breakfast Norval Morton, MD   50 mcg at 01/19/16 0743  . metoprolol tartrate (LOPRESSOR) tablet 25 mg  25 mg Oral BID Norval Morton, MD   25 mg at 01/19/16 0746  . morphine 2 MG/ML injection 2 mg  2 mg Intravenous Q2H PRN Norval Morton, MD      . ondansetron (ZOFRAN) injection 4 mg  4 mg Intravenous Q6H PRN Rondell A Tamala Julian, MD      . pantoprazole (PROTONIX) EC tablet 80 mg  80 mg Oral Q1200 Norval Morton, MD   80 mg at 01/18/16 1245    Filed Vitals:   01/18/16 0830 01/19/16 0428 01/19/16 0743 01/19/16 0746  BP:  159/62 134/70   Pulse: 60 57  52    Temp:  97.6 F (36.4 C)    TempSrc:  Oral    Resp:  18    Height:      Weight:  166 lb 9.6 oz (75.569 kg)    SpO2:  97%      PHYSICAL EXAM General: NAD HEENT: Normal. Neck: No JVD, no thyromegaly.  Lungs: Clear to auscultation bilaterally with normal respiratory effort. CV: Nondisplaced PMI. Regular rate and rhythm, normal S1/S2, no S3/S4, no murmur. No pretibial edema.  Abdomen: Soft, nontender, no distention.  Neurologic: Alert and oriented x 3.  Psych: Normal affect. Musculoskeletal: No gross deformities. Extremities: No clubbing or cyanosis.    LABS: Basic Metabolic Panel:  Recent Labs  01/16/16 2040  NA 142  K 4.8  CL 108  CO2 23  GLUCOSE 92  BUN 22*  CREATININE 1.37*  CALCIUM 9.6   Liver Function Tests: No results for input(s): AST, ALT, ALKPHOS, BILITOT, PROT, ALBUMIN in the last 72 hours. No results for input(s): LIPASE, AMYLASE in the last 72 hours. CBC:  Recent Labs  01/16/16 2040 01/18/16 0525 01/19/16 0451  WBC 6.7 5.7 5.9  NEUTROABS 4.9  --   --   HGB 13.8 13.1 13.5  HCT 44.0 42.1 44.4  MCV 88.5 87.9 88.1  PLT 169 161 171   Cardiac Enzymes:  Recent Labs  01/17/16 0224 01/17/16 0531 01/17/16 0716  TROPONINI <0.03 <0.03 <0.03   BNP: Invalid input(s): POCBNP D-Dimer: No results for input(s): DDIMER in the last 72 hours. Hemoglobin A1C: No results for input(s): HGBA1C in the last 72 hours. Fasting Lipid Panel:  Recent Labs  01/17/16 0306  CHOL 177  HDL 36*  LDLCALC 110*  TRIG 156*  CHOLHDL 4.9   Thyroid Function Tests: No results for input(s): TSH, T4TOTAL, T3FREE, THYROIDAB in the last 72 hours.  Invalid input(s): FREET3 Anemia Panel: No results for input(s): VITAMINB12, FOLATE, FERRITIN, TIBC, IRON, RETICCTPCT in the last 72 hours.  RADIOLOGY: Dg Chest 2 View  01/16/2016  CLINICAL DATA:  Two-day history of chest pain. EXAM: CHEST  2 VIEW COMPARISON:  03/06/2015 FINDINGS: The lungs are clear wiithout focal  pneumonia, edema, pneumothorax or pleural effusion. The cardiopericardial silhouette is within normal limits for size. The visualized bony structures of the thorax are intact. IMPRESSION: No active cardiopulmonary disease. Electronically Signed   By: Misty Stanley M.D.   On: 01/16/2016 20:20      ASSESSMENT AND PLAN: 1. Unstable angina: She has known CAD with last cath April 2016 in setting of acute MI with occluded Circumflex. She has known moderate disease in stented segment of other vessels. EKG without ischemic changes. Troponin negative. Continue IV heparin while awaiting cath Monday. Continue ASA, Lipitor, Plavix, and metoprolol.  2. Essential HTN: Elevated yesterday, normal today. Will consider increasing amlodipine if persistently elevated.  3. Hyperlipidemia: LDL 110. Increased Lipitor to 80 mg 2/25.   Kate Sable, M.D., F.A.C.C.

## 2016-01-19 NOTE — Progress Notes (Signed)
Patient Demographics:    Jessica Taylor, is a 75 y.o. female, DOB - 1941/10/14, [id number]  Admit date - 01/16/2016   Admitting Physician Norval Morton, MD  Outpatient Primary MD for the patient is [name], MD  LOS -    Chief Complaint  Patient presents with  . Chest Pain        Subjective:    Jessica Taylor today has, No headache, No chest pain, No abdominal pain - No Nausea, No new weakness tingling or numbness, No Cough - SOB. Feels fine.   Assessment  & Plan :      1. Chest pain in a patient with CAD. Ruled out for MI with negative troponin 3, EKG nonacute, currently pain-free, on aspirin, Plavix, statin and beta blocker for secondary prevention, cardiology following due for left heart cath on 01/20/2016. We'll defer management of this problem to cardiology.  2. Dyslipidemia. On high intensity statin, LDL is above goal, however she is 75 year old and female, will defer cardiology on increasing the dose of statin.  3. Hypothyroidism. Continue home dose Synthroid.  4. GERD. Continue PPI.  5. Essential hypertension. On Norvasc and beta blocker continue.  6.CKD 3 - creatinine close to baseline of 1.2.    Code Status : Full  Family Communication  : None  Disposition Plan  : Stay inpatient  Consults  : Cards  Procedures  :   Left heart cath 01/20/2016  DVT Prophylaxis  :    Heparin gtt  Lab Results  Component Value Date   PLT 171 01/19/2016    Inpatient Medications  Scheduled Meds: . amLODipine  5 mg Oral Daily  . aspirin EC  81 mg Oral Daily  . atorvastatin  80 mg Oral Daily  . clopidogrel  75 mg Oral Daily  . levothyroxine  50 mcg Oral QAC breakfast  . metoprolol tartrate  25 mg Oral BID  . pantoprazole  80 mg Oral Q1200   Continuous Infusions: . heparin  1,000 Units/hr (01/19/16 0923)   PRN Meds:.acetaminophen, gi cocktail, HYDROcodone-acetaminophen, ketotifen, morphine injection, ondansetron (ZOFRAN) IV  Antibiotics  :     Anti-infectives    None        Objective:   Filed Vitals:   01/18/16 0830 01/19/16 0428 01/19/16 0743 01/19/16 0746  BP:  159/62 134/70   Pulse: 60 57  52  Temp:  97.6 F (36.4 C)    TempSrc:  Oral    Resp:  18    Height:      Weight:  75.569 kg (166 lb 9.6 oz)    SpO2:  97%      Wt Readings from Last 3 Encounters:  01/19/16 75.569 kg (166 lb 9.6 oz)  09/09/15 75.751 kg (167 lb)  08/02/15 74.277 kg (163 lb 12 oz)     Intake/Output Summary (Last 24 hours) at 01/19/16 1009 Last data filed at 01/19/16 0908  Gross per 24 hour  Intake 578.59 ml  Output      0 ml  Net 578.59 ml     Physical Exam  Awake Alert, Oriented X 3, No new F.N deficits, Normal affect [place].AT,PERRAL Supple Neck,No JVD, No cervical lymphadenopathy appriciated.  Symmetrical Chest wall movement, Good air movement bilaterally, CTAB RRR,No  Gallops,Rubs or new Murmurs, No Parasternal Heave +ve B.Sounds, Abd Soft, No tenderness, No organomegaly appriciated, No rebound - guarding or rigidity. No Cyanosis, Clubbing or edema, No new Rash or bruise       Data Review:   Micro Results No results found for this or any previous visit (from the past 240 hour(s)).  Radiology Reports Dg Chest 2 View  01/16/2016  CLINICAL DATA:  Two-day history of chest pain. EXAM: CHEST  2 VIEW COMPARISON:  03/06/2015 FINDINGS: The lungs are clear wiithout focal pneumonia, edema, pneumothorax or pleural effusion. The cardiopericardial silhouette is within normal limits for size. The visualized bony structures of the thorax are intact. IMPRESSION: No active cardiopulmonary disease. Electronically Signed   By: Misty Stanley M.D.   On: 01/16/2016 20:20     CBC  Recent Labs Lab 01/16/16 2040 01/18/16 0525 01/19/16 0451  WBC 6.7 5.7 5.9  HGB 13.8 13.1  13.5  HCT 44.0 42.1 44.4  PLT 169 161 171  MCV 88.5 87.9 88.1  MCH 27.8 27.3 26.8  MCHC 31.4 31.1 30.4  RDW 15.4 15.4 15.7*  LYMPHSABS 1.2  --   --   MONOABS 0.4  --   --   EOSABS 0.1  --   --   BASOSABS 0.0  --   --     Chemistries   Recent Labs Lab 01/16/16 2040  NA 142  K 4.8  CL 108  CO2 23  GLUCOSE 92  BUN 22*  CREATININE 1.37*  CALCIUM 9.6   ------------------------------------------------------------------------------------------------------------------  Recent Labs  01/17/16 0306  CHOL 177  HDL 36*  LDLCALC 110*  TRIG 156*  CHOLHDL 4.9    Lab Results  Component Value Date   HGBA1C 5.9* 03/06/2015   ------------------------------------------------------------------------------------------------------------------ No results for input(s): TSH, T4TOTAL, T3FREE, THYROIDAB in the last 72 hours.  Invalid input(s): FREET3 ------------------------------------------------------------------------------------------------------------------ No results for input(s): VITAMINB12, FOLATE, FERRITIN, TIBC, IRON, RETICCTPCT in the last 72 hours.  Coagulation profile  Recent Labs Lab 01/17/16 1140 01/18/16 0525 01/19/16 0451  INR 1.15 1.09 1.02    No results for input(s): DDIMER in the last 72 hours.  Cardiac Enzymes  Recent Labs Lab 01/17/16 0224 01/17/16 0531 01/17/16 0716  TROPONINI <0.03 <0.03 <0.03   ------------------------------------------------------------------------------------------------------------------    Component Value Date/Time   BNP 67.3 02/08/2015 2337    Time Spent in minutes  35   Kollyns Mickelson K M.D on 01/19/2016 at 10:09 AM  Between 7am to 7pm - Pager - [phone]  After 7pm go to [email or web address] - password [facility]  [facility] -  Office  [phone]

## 2016-01-19 NOTE — Plan of Care (Signed)
Problem: Phase II Progression Outcomes Goal: Cath/PCI Day Path if indicated Outcome: Progressing Having a heart cath on Monday

## 2016-01-20 ENCOUNTER — Ambulatory Visit: Payer: Medicare Other | Admitting: Physician Assistant

## 2016-01-20 ENCOUNTER — Encounter (HOSPITAL_COMMUNITY)
Admission: EM | Disposition: A | Discharge status: Home / Self Care | Payer: Self-pay | Source: Home / Self Care | Attending: Surgery

## 2016-01-20 ENCOUNTER — Other Ambulatory Visit: Payer: Self-pay | Admitting: *Deleted

## 2016-01-20 DIAGNOSIS — I200 Unstable angina: Secondary | ICD-10-CM | POA: Diagnosis not present

## 2016-01-20 DIAGNOSIS — Z9861 Coronary angioplasty status: Secondary | ICD-10-CM

## 2016-01-20 DIAGNOSIS — I25110 Atherosclerotic heart disease of native coronary artery with unstable angina pectoris: Secondary | ICD-10-CM | POA: Diagnosis not present

## 2016-01-20 DIAGNOSIS — I2510 Atherosclerotic heart disease of native coronary artery without angina pectoris: Secondary | ICD-10-CM | POA: Diagnosis not present

## 2016-01-20 DIAGNOSIS — I209 Angina pectoris, unspecified: Secondary | ICD-10-CM | POA: Diagnosis not present

## 2016-01-20 DIAGNOSIS — E782 Mixed hyperlipidemia: Secondary | ICD-10-CM | POA: Diagnosis not present

## 2016-01-20 DIAGNOSIS — I10 Essential (primary) hypertension: Secondary | ICD-10-CM | POA: Diagnosis not present

## 2016-01-20 HISTORY — PX: CARDIAC CATHETERIZATION: SHX172

## 2016-01-20 LAB — CBC
HCT: 41.2 % (ref 36.0–46.0)
HEMOGLOBIN: 12.7 g/dL (ref 12.0–15.0)
MCH: 27.1 pg (ref 26.0–34.0)
MCHC: 30.8 g/dL (ref 30.0–36.0)
MCV: 87.8 fL (ref 78.0–100.0)
Platelets: 159 10*3/uL (ref 150–400)
RBC: 4.69 MIL/uL (ref 3.87–5.11)
RDW: 15.7 % — ABNORMAL HIGH (ref 11.5–15.5)
WBC: 5.9 10*3/uL (ref 4.0–10.5)

## 2016-01-20 LAB — BASIC METABOLIC PANEL
Anion gap: 13 (ref 5–15)
BUN: 23 mg/dL — AB (ref 6–20)
CHLORIDE: 107 mmol/L (ref 101–111)
CO2: 23 mmol/L (ref 22–32)
Calcium: 9.7 mg/dL (ref 8.9–10.3)
Creatinine, Ser: 1.43 mg/dL — ABNORMAL HIGH (ref 0.44–1.00)
GFR calc Af Amer: 40 mL/min — ABNORMAL LOW (ref >60)
GFR, EST NON AFRICAN AMERICAN: 35 mL/min — AB (ref >60)
GLUCOSE: 113 mg/dL — AB (ref 65–99)
POTASSIUM: 4.1 mmol/L (ref 3.5–5.1)
Sodium: 143 mmol/L (ref 135–145)

## 2016-01-20 LAB — HEPARIN LEVEL (UNFRACTIONATED): HEPARIN UNFRACTIONATED: 0.63 [IU]/mL (ref 0.30–0.70)

## 2016-01-20 SURGERY — LEFT HEART CATH AND CORONARY ANGIOGRAPHY

## 2016-01-20 MED ORDER — SODIUM CHLORIDE 0.9% FLUSH
3.0000 mL | Freq: Two times a day (BID) | INTRAVENOUS | Status: DC
  Administered 2016-01-21 – 2016-01-22 (×2): 3 mL via INTRAVENOUS

## 2016-01-20 MED ORDER — MIDAZOLAM HCL 2 MG/2ML IJ SOLN
INTRAMUSCULAR | Status: AC
  Filled 2016-01-20: qty 2

## 2016-01-20 MED ORDER — HEPARIN (PORCINE) IN NACL 2-0.9 UNIT/ML-% IJ SOLN
INTRAMUSCULAR | Status: AC
  Filled 2016-01-20: qty 1000

## 2016-01-20 MED ORDER — FENTANYL CITRATE (PF) 100 MCG/2ML IJ SOLN
INTRAMUSCULAR | Status: DC | PRN
  Administered 2016-01-20: 25 ug via INTRAVENOUS

## 2016-01-20 MED ORDER — SODIUM CHLORIDE 0.9% FLUSH: 3.0000 mL | INTRAVENOUS | Status: DC | PRN

## 2016-01-20 MED ORDER — HEPARIN (PORCINE) IN NACL 100-0.45 UNIT/ML-% IJ SOLN
1000.0000 [IU]/h | INTRAMUSCULAR | Status: DC
  Administered 2016-01-20 – 2016-01-24 (×4): 1000 [IU]/h via INTRAVENOUS
  Filled 2016-01-20 (×2): qty 250

## 2016-01-20 MED ORDER — MIDAZOLAM HCL 2 MG/2ML IJ SOLN
INTRAMUSCULAR | Status: DC | PRN
  Administered 2016-01-20: 2 mg via INTRAVENOUS

## 2016-01-20 MED ORDER — SODIUM CHLORIDE 0.9 % WEIGHT BASED INFUSION
3.0000 mL/kg/h | INTRAVENOUS | Status: AC
  Administered 2016-01-20: 3 mL/kg/h via INTRAVENOUS

## 2016-01-20 MED ORDER — SODIUM CHLORIDE 0.9 % IV SOLN: 250.0000 mL | INTRAVENOUS | Status: DC | PRN

## 2016-01-20 MED ORDER — NITROGLYCERIN 1 MG/10 ML FOR IR/CATH LAB
INTRA_ARTERIAL | Status: AC
  Filled 2016-01-20: qty 10

## 2016-01-20 MED ORDER — SODIUM CHLORIDE 0.9% FLUSH: 3.0000 mL | Freq: Two times a day (BID) | INTRAVENOUS | Status: DC

## 2016-01-20 MED ORDER — LIDOCAINE HCL (PF) 1 % IJ SOLN
INTRAMUSCULAR | Status: DC | PRN
  Administered 2016-01-20: 5 mL via SUBCUTANEOUS

## 2016-01-20 MED ORDER — HEPARIN (PORCINE) IN NACL 2-0.9 UNIT/ML-% IJ SOLN
INTRAMUSCULAR | Status: DC | PRN
  Administered 2016-01-20: 1000 mL

## 2016-01-20 MED ORDER — LIDOCAINE HCL (PF) 1 % IJ SOLN
INTRAMUSCULAR | Status: AC
  Filled 2016-01-20: qty 30

## 2016-01-20 MED ORDER — SODIUM CHLORIDE 0.9 % IV SOLN
INTRAVENOUS | Status: DC
  Administered 2016-01-20: 1000 mL via INTRAVENOUS

## 2016-01-20 MED ORDER — VERAPAMIL HCL 2.5 MG/ML IV SOLN
INTRAVENOUS | Status: DC | PRN
  Administered 2016-01-20: 15:00:00 via INTRA_ARTERIAL

## 2016-01-20 MED ORDER — IOHEXOL 350 MG/ML SOLN
INTRAVENOUS | Status: DC | PRN
  Administered 2016-01-20: 30 mL via INTRA_ARTERIAL

## 2016-01-20 MED ORDER — FENTANYL CITRATE (PF) 100 MCG/2ML IJ SOLN
INTRAMUSCULAR | Status: AC
  Filled 2016-01-20: qty 2

## 2016-01-20 MED ORDER — ASPIRIN 81 MG PO CHEW: 81.0000 mg | CHEWABLE_TABLET | ORAL | Status: DC

## 2016-01-20 SURGICAL SUPPLY — 11 items
CATH INFINITI 5 FR JL3.5 (CATHETERS) ×2 IMPLANT
CATH INFINITI 5FR ANG PIGTAIL (CATHETERS) ×2 IMPLANT
CATH INFINITI JR4 5F (CATHETERS) ×2 IMPLANT
DEVICE RAD COMP TR BAND LRG (VASCULAR PRODUCTS) ×2 IMPLANT
GLIDESHEATH SLEND SS 6F .021 (SHEATH) ×2 IMPLANT
KIT HEART LEFT (KITS) ×2 IMPLANT
PACK CARDIAC CATHETERIZATION (CUSTOM PROCEDURE TRAY) ×2 IMPLANT
TRANSDUCER W/STOPCOCK (MISCELLANEOUS) ×2 IMPLANT
TUBING CIL FLEX 10 FLL-RA (TUBING) ×2 IMPLANT
WIRE HI TORQ VERSACORE-J 145CM (WIRE) ×1 IMPLANT
WIRE SAFE-T 1.5MM-J .035X260CM (WIRE) ×1 IMPLANT

## 2016-01-20 NOTE — Progress Notes (Signed)
Subjective:  No recurrent chest pain  Objective:   Vital Signs : Filed Vitals:   01/19/16 1505 01/19/16 1940 01/19/16 2255 01/20/16 0500  BP: 124/54 151/59 130/77 135/60  Pulse: 48 57 62 55  Temp: 98.1 F (36.7 C) 98.3 F (36.8 C)  97.8 F (36.6 C)  TempSrc: Oral Oral    Resp: 18 20 18 16   Height:      Weight:    166 lb 11.2 oz (75.615 kg)  SpO2: 95% 100% 97% 96%    Intake/Output from previous day:  Intake/Output Summary (Last 24 hours) at 01/20/16 0910 Last data filed at 01/20/16 0800  Gross per 24 hour  Intake    325 ml  Output    650 ml  Net   -325 ml    I/O since admission: +292  Wt Readings from Last 3 Encounters:  01/20/16 166 lb 11.2 oz (75.615 kg)  09/09/15 167 lb (75.751 kg)  08/02/15 163 lb 12 oz (74.277 kg)    Medications: . amLODipine  5 mg Oral Daily  . aspirin EC  81 mg Oral Daily  . atorvastatin  80 mg Oral Daily  . clopidogrel  75 mg Oral Daily  . levothyroxine  50 mcg Oral QAC breakfast  . metoprolol tartrate  25 mg Oral BID  . pantoprazole  80 mg Oral Q1200    . heparin 1,000 Units/hr (01/20/16 0200)    Physical Exam:   General appearance: alert, cooperative and no distress Neck: no adenopathy, no carotid bruit, no JVD, supple, symmetrical, trachea midline and thyroid not enlarged, symmetric, no tenderness/mass/nodules Lungs: clear to auscultation bilaterally Heart: regular rate and rhythm Abdomen: soft, non-tender; bowel sounds normal; no masses,  no organomegaly Extremities: no edema, redness or tenderness in the calves or thighs Pulses: slightly decreased LE but extrermities are warm bilaterally Neurologic: nonfocal   Rate: 56  Rhythm: normal sinus rhythm  ECG (independently read by me): SB at 57; borderline 1st degree AV block; QTc mildly increased; no significant STT changes  Lab Results:  BMP Latest Ref Rng 01/20/2016 01/16/2016 08/12/2015  Glucose 65 - 99 mg/dL 113(H) 92 108(H)  BUN 6 - 20 mg/dL 23(H) 22(H) 20    Creatinine 0.44 - 1.00 mg/dL 1.43(H) 1.37(H) 1.26(H)  Sodium 135 - 145 mmol/L 143 142 144  Potassium 3.5 - 5.1 mmol/L 4.1 4.8 4.1  Chloride 101 - 111 mmol/L 107 108 108  CO2 22 - 32 mmol/L 23 23 30   Calcium 8.9 - 10.3 mg/dL 9.7 9.6 9.6    Hepatic Function Latest Ref Rng 03/06/2015 02/08/2015 12/28/2014  Total Protein 6.0 - 8.3 g/dL 6.3 6.4 6.9  Albumin 3.5 - 5.2 g/dL 3.9 3.3(L) 4.5  AST 0 - 37 U/L 20 15 24   ALT 0 - 35 U/L 19 11 23   Alk Phosphatase 39 - 117 U/L 82 67 86  Total Bilirubin 0.3 - 1.2 mg/dL 0.4 0.8 0.7  Bilirubin, Direct 0.0 - 0.3 mg/dL - - -     Recent Labs  01/18/16 0525 01/19/16 0451 01/20/16 0451  WBC 5.7 5.9 5.9  HGB 13.1 13.5 12.7  HCT 42.1 44.4 41.2  MCV 87.9 88.1 87.8  PLT 161 171 159    No results for input(s): TROPONINI in the last 72 hours.  Invalid input(s): CK, MB  Lab Results  Component Value Date   TSH 0.844 03/06/2015   No results for input(s): HGBA1C in the last 72 hours.  No results for input(s): PROT, ALBUMIN, AST, ALT,  ALKPHOS, BILITOT, BILIDIR, IBILI in the last 72 hours.  Recent Labs  01/19/16 0451  INR 1.02   BNP (last 3 results)  Recent Labs  02/08/15 2337  BNP 67.3    ProBNP (last 3 results) No results for input(s): PROBNP in the last 8760 hours.   Lipid Panel     Component Value Date/Time   CHOL 177 01/17/2016 0306   TRIG 156* 01/17/2016 0306   HDL 36* 01/17/2016 0306   CHOLHDL 4.9 01/17/2016 0306   VLDL 31 01/17/2016 0306   LDLCALC 110* 01/17/2016 0306    Imaging:  No results found.    Assessment/Plan:   Active Problems:   Mixed hyperlipidemia   Essential hypertension   CAD S/P prior PCI to LAD and RCA   Chest pain   Hematuria   Unstable angina ([place])  1. Unstable angina; remains pain free now on heparin. Last cath 02/2015 in the setting of acute MI with occluded Circumflex. She has known moderate disease in stented segment of other vessels. EKG without ischemic changes. Troponin negative.  On  amlodipine, metoprolol.  2. Essential HTN:  BP controlled on Rx  3. PVD: s/p PCI of bilateral iliac arteries 06/27/15 with diamondback orbital rotational atherectomy, PTA and covered stenting using ICast covered stents with resolution of claudication  ABIs normalization.   4. HLD: LDL 110; now on high dose statin.  Plan cath today. The risks and benefits of a cardiac catheterization including, but not limited to, death, stroke, MI, kidney damage and bleeding were discussed with the patient who indicates understanding and agrees to proceed.   Troy Sine, MD, [facility] 01/20/2016, 9:10 AM

## 2016-01-20 NOTE — Plan of Care (Signed)
Problem: Phase II Progression Outcomes Goal: Cath/PCI Day Path if indicated Outcome: Completed/Met Date Met:  01/20/16 Pt scheduled to go for cardiac cath today  Problem: Phase III Progression Outcomes Goal: Cath/PCI Path as indicated Outcome: Completed/Met Date Met:  01/20/16 Pt going for cardiac cath today  Problem: Consults Goal: Cardiac Cath Patient Education (See Patient Education module for education specifics.) Outcome: Completed/Met Date Met:  01/20/16 Pt received education prior to cardiac cath regarding cardiac cath   Problem: Phase I Progression Outcomes Goal: Initial discharge plan identified Outcome: Completed/Met Date Met:  01/20/16 If cardiac cath is clean pt will be discharged today

## 2016-01-20 NOTE — Progress Notes (Signed)
Patient Demographics:    Jessica Taylor, is a 75 y.o. female, DOB - Jun 28, 1941, [id number]  Admit date - 01/16/2016   Admitting Physician Norval Morton, MD  Outpatient Primary MD for the patient is [name], MD  LOS -    Chief Complaint  Patient presents with  . Chest Pain        Subjective:    Jessica Taylor today has, No headache, No chest pain, No abdominal pain - No Nausea, No new weakness tingling or numbness, No Cough - SOB. Feels fine.   Assessment  & Plan :    1. Chest pain in a patient with CAD. Ruled out for MI with negative troponin 3, EKG nonacute, currently pain-free, on aspirin, Plavix, statin and beta blocker for secondary prevention, cardiology following due for left heart cath on 01/20/2016. We'll defer management of this problem to cardiology.  2. Dyslipidemia. On high intensity statin, LDL is above goal, however she is 75 year old and female, will defer cardiology on increasing the dose of statin.  3. Hypothyroidism. Continue home dose Synthroid.  4. GERD. Continue PPI.  5. Essential hypertension. On Norvasc and beta blocker continue.  6.CKD 3 - creatinine close to baseline of 1.2.    Code Status : Full  Family Communication  : None  Disposition Plan  : Stay inpatient  Consults  : Cards  Procedures  :   Left heart cath 01/20/2016  DVT Prophylaxis  :    Heparin gtt  Lab Results  Component Value Date   PLT 159 01/20/2016    Inpatient Medications  Scheduled Meds: . amLODipine  5 mg Oral Daily  . aspirin  81 mg Oral Pre-Cath  . aspirin EC  81 mg Oral Daily  . atorvastatin  80 mg Oral Daily  . clopidogrel  75 mg Oral Daily  . levothyroxine  50 mcg Oral QAC breakfast  . metoprolol tartrate  25 mg Oral BID  . pantoprazole  80 mg Oral Q1200  .  sodium chloride flush  3 mL Intravenous Q12H   Continuous Infusions: . sodium chloride 1,000 mL (01/20/16 1029)  . heparin 1,000 Units/hr (01/20/16 1030)   PRN Meds:.sodium chloride, acetaminophen, gi cocktail, HYDROcodone-acetaminophen, ketotifen, morphine injection, ondansetron (ZOFRAN) IV, sodium chloride flush  Antibiotics  :     Anti-infectives    None        Objective:   Filed Vitals:   01/19/16 1505 01/19/16 1940 01/19/16 2255 01/20/16 0500  BP: 124/54 151/59 130/77 135/60  Pulse: 48 57 62 55  Temp: 98.1 F (36.7 C) 98.3 F (36.8 C)  97.8 F (36.6 C)  TempSrc: Oral Oral    Resp: 18 20 18 16   Height:      Weight:    75.615 kg (166 lb 11.2 oz)  SpO2: 95% 100% 97% 96%    Wt Readings from Last 3 Encounters:  01/20/16 75.615 kg (166 lb 11.2 oz)  09/09/15 75.751 kg (167 lb)  08/02/15 74.277 kg (163 lb 12 oz)     Intake/Output Summary (Last 24 hours) at 01/20/16 1042 Last data filed at 01/20/16 0800  Gross per 24 hour  Intake    325 ml  Output    650 ml  Net   -325  ml     Physical Exam  Awake Alert, Oriented X 3, No new F.N deficits, Normal affect [place].AT,PERRAL Supple Neck,No JVD, No cervical lymphadenopathy appriciated.  Symmetrical Chest wall movement, Good air movement bilaterally, CTAB RRR,No Gallops,Rubs or new Murmurs, No Parasternal Heave +ve B.Sounds, Abd Soft, No tenderness, No organomegaly appriciated, No rebound - guarding or rigidity. No Cyanosis, Clubbing or edema, No new Rash or bruise       Data Review:   Micro Results No results found for this or any previous visit (from the past 240 hour(s)).  Radiology Reports Dg Chest 2 View  01/16/2016  CLINICAL DATA:  Two-day history of chest pain. EXAM: CHEST  2 VIEW COMPARISON:  03/06/2015 FINDINGS: The lungs are clear wiithout focal pneumonia, edema, pneumothorax or pleural effusion. The cardiopericardial silhouette is within normal limits for size. The visualized bony structures of the thorax  are intact. IMPRESSION: No active cardiopulmonary disease. Electronically Signed   By: Misty Stanley M.D.   On: 01/16/2016 20:20     CBC  Recent Labs Lab 01/16/16 2040 01/18/16 0525 01/19/16 0451 01/20/16 0451  WBC 6.7 5.7 5.9 5.9  HGB 13.8 13.1 13.5 12.7  HCT 44.0 42.1 44.4 41.2  PLT 169 161 171 159  MCV 88.5 87.9 88.1 87.8  MCH 27.8 27.3 26.8 27.1  MCHC 31.4 31.1 30.4 30.8  RDW 15.4 15.4 15.7* 15.7*  LYMPHSABS 1.2  --   --   --   MONOABS 0.4  --   --   --   EOSABS 0.1  --   --   --   BASOSABS 0.0  --   --   --     Chemistries   Recent Labs Lab 01/16/16 2040 01/20/16 0451  NA 142 143  K 4.8 4.1  CL 108 107  CO2 23 23  GLUCOSE 92 113*  BUN 22* 23*  CREATININE 1.37* 1.43*  CALCIUM 9.6 9.7   ------------------------------------------------------------------------------------------------------------------ No results for input(s): CHOL, HDL, LDLCALC, TRIG, CHOLHDL, LDLDIRECT in the last 72 hours.  Lab Results  Component Value Date   HGBA1C 5.9* 03/06/2015   ------------------------------------------------------------------------------------------------------------------ No results for input(s): TSH, T4TOTAL, T3FREE, THYROIDAB in the last 72 hours.  Invalid input(s): FREET3 ------------------------------------------------------------------------------------------------------------------ No results for input(s): VITAMINB12, FOLATE, FERRITIN, TIBC, IRON, RETICCTPCT in the last 72 hours.  Coagulation profile  Recent Labs Lab 01/17/16 1140 01/18/16 0525 01/19/16 0451  INR 1.15 1.09 1.02    No results for input(s): DDIMER in the last 72 hours.  Cardiac Enzymes  Recent Labs Lab 01/17/16 0224 01/17/16 0531 01/17/16 0716  TROPONINI <0.03 <0.03 <0.03   ------------------------------------------------------------------------------------------------------------------    Component Value Date/Time   BNP 67.3 02/08/2015 2337    Time Spent in minutes   35   Tyyne Cliett K M.D on 01/20/2016 at 10:42 AM  Between 7am to 7pm - Pager - [phone]  After 7pm go to [email or web address] - password [facility]  [facility] -  Office  [phone]

## 2016-01-20 NOTE — H&P (View-Only) (Signed)
Subjective:  No recurrent chest pain  Objective:   Vital Signs : Filed Vitals:   01/19/16 1505 01/19/16 1940 01/19/16 2255 01/20/16 0500  BP: 124/54 151/59 130/77 135/60  Pulse: 48 57 62 55  Temp: 98.1 F (36.7 C) 98.3 F (36.8 C)  97.8 F (36.6 C)  TempSrc: Oral Oral    Resp: 18 20 18 16   Height:      Weight:    166 lb 11.2 oz (75.615 kg)  SpO2: 95% 100% 97% 96%    Intake/Output from previous day:  Intake/Output Summary (Last 24 hours) at 01/20/16 0910 Last data filed at 01/20/16 0800  Gross per 24 hour  Intake    325 ml  Output    650 ml  Net   -325 ml    I/O since admission: +292  Wt Readings from Last 3 Encounters:  01/20/16 166 lb 11.2 oz (75.615 kg)  09/09/15 167 lb (75.751 kg)  08/02/15 163 lb 12 oz (74.277 kg)    Medications: . amLODipine  5 mg Oral Daily  . aspirin EC  81 mg Oral Daily  . atorvastatin  80 mg Oral Daily  . clopidogrel  75 mg Oral Daily  . levothyroxine  50 mcg Oral QAC breakfast  . metoprolol tartrate  25 mg Oral BID  . pantoprazole  80 mg Oral Q1200    . heparin 1,000 Units/hr (01/20/16 0200)    Physical Exam:   General appearance: alert, cooperative and no distress Neck: no adenopathy, no carotid bruit, no JVD, supple, symmetrical, trachea midline and thyroid not enlarged, symmetric, no tenderness/mass/nodules Lungs: clear to auscultation bilaterally Heart: regular rate and rhythm Abdomen: soft, non-tender; bowel sounds normal; no masses,  no organomegaly Extremities: no edema, redness or tenderness in the calves or thighs Pulses: slightly decreased LE but extrermities are warm bilaterally Neurologic: nonfocal   Rate: 56  Rhythm: normal sinus rhythm  ECG (independently read by me): SB at 57; borderline 1st degree AV block; QTc mildly increased; no significant STT changes  Lab Results:  BMP Latest Ref Rng 01/20/2016 01/16/2016 08/12/2015  Glucose 65 - 99 mg/dL 113(H) 92 108(H)  BUN 6 - 20 mg/dL 23(H) 22(H) 20    Creatinine 0.44 - 1.00 mg/dL 1.43(H) 1.37(H) 1.26(H)  Sodium 135 - 145 mmol/L 143 142 144  Potassium 3.5 - 5.1 mmol/L 4.1 4.8 4.1  Chloride 101 - 111 mmol/L 107 108 108  CO2 22 - 32 mmol/L 23 23 30   Calcium 8.9 - 10.3 mg/dL 9.7 9.6 9.6    Hepatic Function Latest Ref Rng 03/06/2015 02/08/2015 12/28/2014  Total Protein 6.0 - 8.3 g/dL 6.3 6.4 6.9  Albumin 3.5 - 5.2 g/dL 3.9 3.3(L) 4.5  AST 0 - 37 U/L 20 15 24   ALT 0 - 35 U/L 19 11 23   Alk Phosphatase 39 - 117 U/L 82 67 86  Total Bilirubin 0.3 - 1.2 mg/dL 0.4 0.8 0.7  Bilirubin, Direct 0.0 - 0.3 mg/dL - - -     Recent Labs  01/18/16 0525 01/19/16 0451 01/20/16 0451  WBC 5.7 5.9 5.9  HGB 13.1 13.5 12.7  HCT 42.1 44.4 41.2  MCV 87.9 88.1 87.8  PLT 161 171 159    No results for input(s): TROPONINI in the last 72 hours.  Invalid input(s): CK, MB  Lab Results  Component Value Date   TSH 0.844 03/06/2015   No results for input(s): HGBA1C in the last 72 hours.  No results for input(s): PROT, ALBUMIN, AST, ALT,  ALKPHOS, BILITOT, BILIDIR, IBILI in the last 72 hours.  Recent Labs  01/19/16 0451  INR 1.02   BNP (last 3 results)  Recent Labs  02/08/15 2337  BNP 67.3    ProBNP (last 3 results) No results for input(s): PROBNP in the last 8760 hours.   Lipid Panel     Component Value Date/Time   CHOL 177 01/17/2016 0306   TRIG 156* 01/17/2016 0306   HDL 36* 01/17/2016 0306   CHOLHDL 4.9 01/17/2016 0306   VLDL 31 01/17/2016 0306   LDLCALC 110* 01/17/2016 0306    Imaging:  No results found.    Assessment/Plan:   Active Problems:   Mixed hyperlipidemia   Essential hypertension   CAD S/P prior PCI to LAD and RCA   Chest pain   Hematuria   Unstable angina ([place])  1. Unstable angina; remains pain free now on heparin. Last cath 02/2015 in the setting of acute MI with occluded Circumflex. She has known moderate disease in stented segment of other vessels. EKG without ischemic changes. Troponin negative.  On  amlodipine, metoprolol.  2. Essential HTN:  BP controlled on Rx  3. PVD: s/p PCI of bilateral iliac arteries 06/27/15 with diamondback orbital rotational atherectomy, PTA and covered stenting using ICast covered stents with resolution of claudication  ABIs normalization.   4. HLD: LDL 110; now on high dose statin.  Plan cath today. The risks and benefits of a cardiac catheterization including, but not limited to, death, stroke, MI, kidney damage and bleeding were discussed with the patient who indicates understanding and agrees to proceed.   Troy Sine, MD, [facility] 01/20/2016, 9:10 AM

## 2016-01-20 NOTE — Progress Notes (Signed)
R radial and R groin shave and prepped for am Cardiac Catheterization. Pt now NPO.Awaiting Cath orders.

## 2016-01-20 NOTE — Progress Notes (Addendum)
Addendum:  Patient now s/p cath, restarting heparin drip 8 hours post sheath removal.  Of note: 15:03:48 01/20/16 Sheath Removal   Plan to restart heparin at 1000 units/hr for 2300 tonight and will get heparin level in the morning.       ANTICOAGULATION CONSULT NOTE - Follow Up Consult  Pharmacy Consult for Heparin Indication: chest pain/ACS  Allergies  Allergen Reactions  . Shrimp [Shellfish Allergy] Nausea And Vomiting    Throws up violently    Patient Measurements: Height: 5\' 3"  (160 cm) Weight: 166 lb 11.2 oz (75.615 kg) IBW/kg (Calculated) : 52.4 Heparin Dosing Weight: 70 kg  Vital Signs: Temp: 97.8 F (36.6 C) (02/27 0500) BP: 135/60 mmHg (02/27 0500) Pulse Rate: 55 (02/27 0500)  Labs:  Recent Labs  01/18/16 0525  01/19/16 0451 01/19/16 1422 01/20/16 0451  HGB 13.1  --  13.5  --  12.7  HCT 42.1  --  44.4  --  41.2  PLT 161  --  171  --  159  LABPROT 14.3  --  13.6  --   --   INR 1.09  --  1.02  --   --   HEPARINUNFRC  --   < > 0.80* 0.65 0.63  CREATININE  --   --   --   --  1.43*  < > = values in this interval not displayed.  Estimated Creatinine Clearance: 32.6 mL/min (by C-G formula based on Cr of 1.43).   Medications:  Scheduled:  . amLODipine  5 mg Oral Daily  . aspirin  81 mg Oral Pre-Cath  . aspirin EC  81 mg Oral Daily  . atorvastatin  80 mg Oral Daily  . clopidogrel  75 mg Oral Daily  . levothyroxine  50 mcg Oral QAC breakfast  . metoprolol tartrate  25 mg Oral BID  . pantoprazole  80 mg Oral Q1200  . sodium chloride flush  3 mL Intravenous Q12H   Infusions:  . sodium chloride 1,000 mL (01/20/16 1029)  . heparin 1,000 Units/hr (01/20/16 1030)    Assessment: 75 yo F with hx CAD presented to the ED 2/24 with recurrent CP.  Pt was started on heparin infusion with plans for cardiac cath Monday. Heparin level is now therapeutic x 2. No further bleeding noted.   Goal of Therapy:  Heparin level 0.3-0.7 units/ml Monitor platelets by  anticoagulation protocol: Yes   Plan:  - Continue heparin gtt at 1000 units/hr - F/u daily heparin level   Thank you for allowing [place] to participate in this patients care. Jens Som, PharmD Pager: [phone] 01/20/2016 12:22 PM

## 2016-01-20 NOTE — Interval H&P Note (Signed)
History and Physical Interval Note:  01/20/2016 2:31 PM  Jessica Taylor  has presented today for surgery, with the diagnosis of cp  The various methods of treatment have been discussed with the patient and family. After consideration of risks, benefits and other options for treatment, the patient has consented to  Procedure(s): Left Heart Cath and Coronary Angiography (N/A) as a surgical intervention .  The patient's history has been reviewed, patient examined, no change in status, stable for surgery.  I have reviewed the patient's chart and labs.  Questions were answered to the patient's satisfaction.   Cath Lab Visit (complete for each Cath Lab visit)  Clinical Evaluation Leading to the Procedure:   ACS: Yes.    Non-ACS:    Anginal Classification: CCS III  Anti-ischemic medical therapy: Maximal Therapy (2 or more classes of medications)  Non-Invasive Test Results: No non-invasive testing performed  Prior CABG: No previous CABG        Collier [facility] 01/20/2016 2:31 PM

## 2016-01-21 ENCOUNTER — Observation Stay (HOSPITAL_COMMUNITY): Payer: Medicare Other

## 2016-01-21 ENCOUNTER — Encounter (HOSPITAL_COMMUNITY): Payer: Self-pay | Admitting: Cardiology

## 2016-01-21 DIAGNOSIS — Z955 Presence of coronary angioplasty implant and graft: Secondary | ICD-10-CM | POA: Diagnosis not present

## 2016-01-21 DIAGNOSIS — E8770 Fluid overload, unspecified: Secondary | ICD-10-CM | POA: Diagnosis not present

## 2016-01-21 DIAGNOSIS — I25110 Atherosclerotic heart disease of native coronary artery with unstable angina pectoris: Secondary | ICD-10-CM | POA: Diagnosis present

## 2016-01-21 DIAGNOSIS — Z8249 Family history of ischemic heart disease and other diseases of the circulatory system: Secondary | ICD-10-CM | POA: Diagnosis not present

## 2016-01-21 DIAGNOSIS — I200 Unstable angina: Secondary | ICD-10-CM | POA: Diagnosis not present

## 2016-01-21 DIAGNOSIS — I129 Hypertensive chronic kidney disease with stage 1 through stage 4 chronic kidney disease, or unspecified chronic kidney disease: Secondary | ICD-10-CM | POA: Diagnosis present

## 2016-01-21 DIAGNOSIS — R079 Chest pain, unspecified: Secondary | ICD-10-CM | POA: Diagnosis present

## 2016-01-21 DIAGNOSIS — I739 Peripheral vascular disease, unspecified: Secondary | ICD-10-CM | POA: Diagnosis present

## 2016-01-21 DIAGNOSIS — R34 Anuria and oliguria: Secondary | ICD-10-CM | POA: Diagnosis not present

## 2016-01-21 DIAGNOSIS — I209 Angina pectoris, unspecified: Secondary | ICD-10-CM | POA: Diagnosis not present

## 2016-01-21 DIAGNOSIS — R110 Nausea: Secondary | ICD-10-CM | POA: Diagnosis not present

## 2016-01-21 DIAGNOSIS — M1711 Unilateral primary osteoarthritis, right knee: Secondary | ICD-10-CM | POA: Diagnosis present

## 2016-01-21 DIAGNOSIS — D62 Acute posthemorrhagic anemia: Secondary | ICD-10-CM | POA: Diagnosis not present

## 2016-01-21 DIAGNOSIS — I714 Abdominal aortic aneurysm, without rupture: Secondary | ICD-10-CM | POA: Diagnosis present

## 2016-01-21 DIAGNOSIS — N183 Chronic kidney disease, stage 3 (moderate): Secondary | ICD-10-CM | POA: Diagnosis present

## 2016-01-21 DIAGNOSIS — R319 Hematuria, unspecified: Secondary | ICD-10-CM | POA: Diagnosis not present

## 2016-01-21 DIAGNOSIS — I2510 Atherosclerotic heart disease of native coronary artery without angina pectoris: Secondary | ICD-10-CM | POA: Diagnosis not present

## 2016-01-21 DIAGNOSIS — R001 Bradycardia, unspecified: Secondary | ICD-10-CM | POA: Diagnosis not present

## 2016-01-21 DIAGNOSIS — K219 Gastro-esophageal reflux disease without esophagitis: Secondary | ICD-10-CM | POA: Diagnosis present

## 2016-01-21 DIAGNOSIS — E039 Hypothyroidism, unspecified: Secondary | ICD-10-CM | POA: Diagnosis present

## 2016-01-21 DIAGNOSIS — E782 Mixed hyperlipidemia: Secondary | ICD-10-CM | POA: Diagnosis present

## 2016-01-21 DIAGNOSIS — I10 Essential (primary) hypertension: Secondary | ICD-10-CM | POA: Diagnosis not present

## 2016-01-21 DIAGNOSIS — I252 Old myocardial infarction: Secondary | ICD-10-CM | POA: Diagnosis not present

## 2016-01-21 LAB — BASIC METABOLIC PANEL
ANION GAP: 9 (ref 5–15)
BUN: 18 mg/dL (ref 6–20)
CALCIUM: 9.5 mg/dL (ref 8.9–10.3)
CO2: 25 mmol/L (ref 22–32)
Chloride: 110 mmol/L (ref 101–111)
Creatinine, Ser: 1.33 mg/dL — ABNORMAL HIGH (ref 0.44–1.00)
GFR calc non Af Amer: 38 mL/min — ABNORMAL LOW (ref >60)
GFR, EST AFRICAN AMERICAN: 44 mL/min — AB (ref >60)
Glucose, Bld: 109 mg/dL — ABNORMAL HIGH (ref 65–99)
Potassium: 4.2 mmol/L (ref 3.5–5.1)
Sodium: 144 mmol/L (ref 135–145)

## 2016-01-21 LAB — CBC
HEMATOCRIT: 42.3 % (ref 36.0–46.0)
HEMOGLOBIN: 12.8 g/dL (ref 12.0–15.0)
MCH: 26.8 pg (ref 26.0–34.0)
MCHC: 30.3 g/dL (ref 30.0–36.0)
MCV: 88.7 fL (ref 78.0–100.0)
Platelets: 144 10*3/uL — ABNORMAL LOW (ref 150–400)
RBC: 4.77 MIL/uL (ref 3.87–5.11)
RDW: 15.8 % — AB (ref 11.5–15.5)
WBC: 5.9 10*3/uL (ref 4.0–10.5)

## 2016-01-21 LAB — HEPARIN LEVEL (UNFRACTIONATED): Heparin Unfractionated: 0.43 IU/mL (ref 0.30–0.70)

## 2016-01-21 MED FILL — Nitroglycerin IV Soln 100 MCG/ML in D5W: INTRA_ARTERIAL | Qty: 10 | Status: AC

## 2016-01-21 NOTE — Consult Note (Signed)
[address]       [place] [address]             [phone]      Cardiothoracic Surgery Consultation  Reason for Consult: Severe multivessel coronary disease with unstable angina Referring Physician: Dr. Peter Martinique  Jessica Taylor is an 75 y.o. female.  HPI:   The patient is a 75 year old woman with a history of hypothyroidism, hyperlipidemia, stage III CKD, AAA, PAD s/p bilateral iliac PCI and stenting in 06/2015, and CAD s/p PCI/stenting of the LAD and RCA, PCI of an occluded LCX after MI in 02/2015. She was doing well without any symptoms until about 2/21 when she started having recurrent exertional chest tightness relieved with rest and NTG. She continued having the episodes for several days prior to admission and when she awoke from sleep with chest tightness she thought she better come in for evaluation. She had no ECG changes and negative troponin x 2 on admission. Echo showed normal LVEF of 55-60% with grade 2 diastolic dysfunction, no AS or AI, no MR. Cardiac cath shows severe 3 vessel CAD with a 95% distal RCA lesion at the distal margin of the old stent. The LAD has 90% proximal to mid stenosis between two prior stents. The large Ramus has 65% long in-stent restenosis. She was on Plavix which was stopped after yesterdays dose. She is on heparin and has continued to have chest tightness that is mild while walking to bathroom, relieved with rest.  Past Medical History  Diagnosis Date  . Acid reflux   . Hypothyroidism   . MI, old 62  . Hypercholesteremia   . Vertigo     when lays on left side.  . Cancer ([facility])     basal cell on nose  . Anemia   . Coronary artery disease   . Hypertension   . Arthritis     knees , R shoulder - tx /w injection - 11/2014  . Tobacco abuse   . Abdominal aortic aneurysm ([facility])   . Peripheral arterial disease ([facility])     hhigh-grade ostial bilateral calcified iliac stenosis with claudication    Past Surgical History    Procedure Laterality Date  . Coronary stent placement    . Cardiac catheterization  5 stents  . Tubal ligation    . Cataract extraction w/phaco Left 05/24/2014    Procedure: CATARACT EXTRACTION PHACO AND INTRAOCULAR LENS PLACEMENT (IOC);  Surgeon: Tonny Branch, MD;  Location: [facility];  Service: Ophthalmology;  Laterality: Left;  CDE:  9.30  . Cataract extraction w/phaco Right 06/18/2014    Procedure: CATARACT EXTRACTION PHACO AND INTRAOCULAR LENS PLACEMENT RIGHT EYE CDE=10.84;  Surgeon: Tonny Branch, MD;  Location: [facility];  Service: Ophthalmology;  Laterality: Right;  . Eye surgery    . Tonsillectomy      age 59  . Partial knee arthroplasty Right 02/04/2015    Procedure: UNICOMPARTMENTAL KNEE;  Surgeon: Dorna Leitz, MD;  Location: [facility];  Service: Orthopedics;  Laterality: Right;  . Left heart catheterization with coronary angiogram N/A 03/06/2015    Procedure: LEFT HEART CATHETERIZATION WITH CORONARY ANGIOGRAM;  Surgeon: Lorretta Harp, MD;  Location: [facility] CATH [facility];  Service: Cardiovascular;  Laterality: N/A;  . Coronary stent placement  03/06/15    CFX DES  . Peripheral vascular catheterization Bilateral 05/13/2015    Procedure: Lower Extremity Angiography;  Surgeon: Lorretta Harp, MD;  Location: [facility];  Service: Cardiovascular;  Laterality: Bilateral;  . Peripheral vascular catheterization N/A 05/13/2015    Procedure: Abdominal Aortogram;  Surgeon: Lorretta Harp, MD;  Location: [facility];  Service: Cardiovascular;  Laterality: N/A;  . Peripheral vascular catheterization Bilateral 06/27/2015    Procedure: Peripheral Vascular Intervention;  Surgeon: Lorretta Harp, MD;  Location: [facility];  Service: Cardiovascular;  Laterality: Bilateral;  ILIACS  . Peripheral vascular catheterization Bilateral 06/27/2015    Procedure: Peripheral Vascular Atherectomy;  Surgeon: Lorretta Harp, MD;  Location: [facility];  Service: Cardiovascular;  Laterality: Bilateral;  .  Cardiac catheterization N/A 01/20/2016    Procedure: Left Heart Cath and Coronary Angiography;  Surgeon: Peter M Martinique, MD;  Location: [facility];  Service: Cardiovascular;  Laterality: N/A;    Family History  Problem Relation Age of Onset  . Cancer Mother 53  . Cancer Father 61  . Heart attack Neg Hx   . Stroke Maternal Grandfather   . Hypertension Maternal Grandmother   . Hypertension Son     Social History:  reports that she quit smoking about 16 years ago. Her smoking use included Cigarettes. She has a 63 pack-year smoking history. She has never used smokeless tobacco. She reports that she does not drink alcohol or use illicit drugs.  Allergies:  Allergies  Allergen Reactions  . Shrimp [Shellfish Allergy] Nausea And Vomiting    Throws up violently    Medications:  I have reviewed the patient's current medications. Prior to Admission:  Facility-administered medications prior to admission  Medication Dose Route Frequency Provider Last Rate Last Dose  . diphenhydrAMINE (BENADRYL) injection 25 mg  25 mg Intravenous Once Lorretta Harp, MD      . famotidine (PEPCID) 20 mg in sodium chloride 0.9 % 50 mL IVPB  20 mg Intravenous Once Lorretta Harp, MD      . methylPREDNISolone sodium succinate (SOLU-MEDROL) 125 mg/2 mL injection 60 mg  60 mg Intramuscular Once Lorretta Harp, MD       Prescriptions prior to admission  Medication Sig Dispense Refill Last Dose  . amLODipine (NORVASC) 5 MG tablet Take 5 mg by mouth daily.    01/16/2016 at Unknown time  . aspirin EC 81 MG tablet Take 81 mg by mouth daily.    01/16/2016 at Unknown time  . atorvastatin (LIPITOR) 40 MG tablet Take 1 tablet (40 mg total) by mouth daily. 30 tablet 11 01/16/2016 at Unknown time  . clopidogrel (PLAVIX) 75 MG tablet Take 1 tablet (75 mg total) by mouth daily. 30 tablet 11 01/16/2016 at 1630  . esomeprazole (NEXIUM) 40 MG capsule TAKE 1 CAPSULE BY MOUTH DAILY AT NOON. 30 capsule 1 01/16/2016 at Unknown  time  . HYDROcodone-acetaminophen (NORCO/VICODIN) 5-325 MG tablet Take 1 tablet by mouth every 6 (six) hours as needed for moderate pain (PAIN).   Past Week at Unknown time  . Ketotifen Fumarate (THERA TEARS ALLERGY OP) Apply 1 drop to eye 2 (two) times daily as needed (dry eyes).   over 30 days  . levothyroxine (SYNTHROID, LEVOTHROID) 50 MCG tablet Take 50 mcg by mouth daily before breakfast.   01/16/2016 at Unknown time  . metoprolol tartrate (LOPRESSOR) 25 MG tablet TAKE 1 TABLET BY MOUTH TWICE DAILY. 60 tablet 9 01/16/2016 at 1630  . nitroGLYCERIN (NITROSTAT) 0.4 MG SL tablet Place 1 tablet (0.4 mg total) under the tongue every 5 (five) minutes x 3 doses as needed for chest pain. 25  tablet 3 01/16/2016 at Unknown time  . furosemide (LASIX) 20 MG tablet Take one tablet by mouth daily except on Monday, Wednesday and Friday, take two tablets (Patient not taking: Reported on 01/16/2016) 135 tablet 3 Not Taking at Unknown time  . potassium chloride (K-DUR) 10 MEQ tablet Take 1 tablet (10 mEq total) by mouth 2 (two) times daily. Take 1 tablet by mouth twice a day for 4 days then take 1 tablet by mouth 1 time a day (Patient not taking: Reported on 01/16/2016) 90 tablet 3 Not Taking at Unknown time   Scheduled: . amLODipine  5 mg Oral Daily  . aspirin EC  81 mg Oral Daily  . atorvastatin  80 mg Oral Daily  . levothyroxine  50 mcg Oral QAC breakfast  . metoprolol tartrate  25 mg Oral BID  . pantoprazole  80 mg Oral Q1200  . sodium chloride flush  3 mL Intravenous Q12H   Continuous: . heparin 1,000 Units/hr (01/20/16 2348)   DJS:HFWYOV chloride, acetaminophen, gi cocktail, HYDROcodone-acetaminophen, ketotifen, morphine injection, ondansetron (ZOFRAN) IV, sodium chloride flush Anti-infectives    None      Results for orders placed or performed during the hospital encounter of 01/16/16 (from the past 48 hour(s))  Heparin level (unfractionated)     Status: None   Collection Time: 01/19/16  2:22 PM    Result Value Ref Range   Heparin Unfractionated 0.65 0.30 - 0.70 IU/mL    Comment:        IF HEPARIN RESULTS ARE BELOW EXPECTED VALUES, AND PATIENT DOSAGE HAS BEEN CONFIRMED, SUGGEST FOLLOW UP TESTING OF ANTITHROMBIN III LEVELS.   CBC     Status: Abnormal   Collection Time: 01/20/16  4:51 AM  Result Value Ref Range   WBC 5.9 4.0 - 10.5 K/uL   RBC 4.69 3.87 - 5.11 MIL/uL   Hemoglobin 12.7 12.0 - 15.0 g/dL   HCT 41.2 36.0 - 46.0 %   MCV 87.8 78.0 - 100.0 fL   MCH 27.1 26.0 - 34.0 pg   MCHC 30.8 30.0 - 36.0 g/dL   RDW 15.7 (H) 11.5 - 15.5 %   Platelets 159 150 - 400 K/uL  Heparin level (unfractionated)     Status: None   Collection Time: 01/20/16  4:51 AM  Result Value Ref Range   Heparin Unfractionated 0.63 0.30 - 0.70 IU/mL    Comment:        IF HEPARIN RESULTS ARE BELOW EXPECTED VALUES, AND PATIENT DOSAGE HAS BEEN CONFIRMED, SUGGEST FOLLOW UP TESTING OF ANTITHROMBIN III LEVELS.   Basic metabolic panel     Status: Abnormal   Collection Time: 01/20/16  4:51 AM  Result Value Ref Range   Sodium 143 135 - 145 mmol/L   Potassium 4.1 3.5 - 5.1 mmol/L   Chloride 107 101 - 111 mmol/L   CO2 23 22 - 32 mmol/L   Glucose, Bld 113 (H) 65 - 99 mg/dL   BUN 23 (H) 6 - 20 mg/dL   Creatinine, Ser 1.43 (H) 0.44 - 1.00 mg/dL   Calcium 9.7 8.9 - 10.3 mg/dL   GFR calc non Af Amer 35 (L) >60 mL/min   GFR calc Af Amer 40 (L) >60 mL/min    Comment: (NOTE) The eGFR has been calculated using the CKD EPI equation. This calculation has not been validated in all clinical situations. eGFR's persistently <60 mL/min signify possible Chronic Kidney Disease.    Anion gap 13 5 - 15  CBC  Status: Abnormal   Collection Time: 01/21/16  6:02 AM  Result Value Ref Range   WBC 5.9 4.0 - 10.5 K/uL   RBC 4.77 3.87 - 5.11 MIL/uL   Hemoglobin 12.8 12.0 - 15.0 g/dL   HCT 42.3 36.0 - 46.0 %   MCV 88.7 78.0 - 100.0 fL   MCH 26.8 26.0 - 34.0 pg   MCHC 30.3 30.0 - 36.0 g/dL   RDW 15.8 (H) 11.5 - 15.5 %    Platelets 144 (L) 150 - 400 K/uL  Basic metabolic panel     Status: Abnormal   Collection Time: 01/21/16  6:02 AM  Result Value Ref Range   Sodium 144 135 - 145 mmol/L   Potassium 4.2 3.5 - 5.1 mmol/L   Chloride 110 101 - 111 mmol/L   CO2 25 22 - 32 mmol/L   Glucose, Bld 109 (H) 65 - 99 mg/dL   BUN 18 6 - 20 mg/dL   Creatinine, Ser 1.33 (H) 0.44 - 1.00 mg/dL   Calcium 9.5 8.9 - 10.3 mg/dL   GFR calc non Af Amer 38 (L) >60 mL/min   GFR calc Af Amer 44 (L) >60 mL/min    Comment: (NOTE) The eGFR has been calculated using the CKD EPI equation. This calculation has not been validated in all clinical situations. eGFR's persistently <60 mL/min signify possible Chronic Kidney Disease.    Anion gap 9 5 - 15  Heparin level (unfractionated)     Status: None   Collection Time: 01/21/16  6:02 AM  Result Value Ref Range   Heparin Unfractionated 0.43 0.30 - 0.70 IU/mL    Comment:        IF HEPARIN RESULTS ARE BELOW EXPECTED VALUES, AND PATIENT DOSAGE HAS BEEN CONFIRMED, SUGGEST FOLLOW UP TESTING OF ANTITHROMBIN III LEVELS.     No results found.  Review of Systems  Constitutional: Negative for fever, chills and malaise/fatigue.  HENT: Negative.   Eyes: Negative.   Respiratory: Negative for shortness of breath.   Cardiovascular: Positive for chest pain. Negative for palpitations, orthopnea, leg swelling and PND.  Gastrointestinal: Negative.   Genitourinary: Negative.   Musculoskeletal: Negative.   Skin: Negative.   Neurological: Negative.   Endo/Heme/Allergies: Bruises/bleeds easily.  Psychiatric/Behavioral: Negative.    Blood pressure 130/54, pulse 52, temperature 97.8 F (36.6 C), temperature source Oral, resp. rate 18, height _0  (1.6 m), weight 74.98 kg (165 lb 4.8 oz), SpO2 96 %. Physical Exam  Constitutional: She is oriented to person, place, and time. She appears well-developed and well-nourished. No distress.  HENT:  Head: Normocephalic and atraumatic.   Mouth/Throat: Oropharynx is clear and moist.  Eyes: EOM are normal. Pupils are equal, round, and reactive to light.  Neck: Normal range of motion. Neck supple. No JVD present. No thyromegaly present.  Cardiovascular: Normal rate and regular rhythm.   Murmur heard. 1/6 systolic flow murmur along RSB  Respiratory: Effort normal and breath sounds normal. No respiratory distress. She has no rales.  GI: Soft. Bowel sounds are normal. She exhibits no distension and no mass. There is no tenderness.  Musculoskeletal: Normal range of motion. She exhibits no edema.  Lymphadenopathy:    She has no cervical adenopathy.  Neurological: She is alert and oriented to person, place, and time. She has normal strength. No cranial nerve deficit or sensory deficit.  Skin: Skin is warm and dry.  Psychiatric: She has a normal mood and affect.          *[place]*         *  [place],  [id number]              [phone]  ------------------------------------------------------------------- Transthoracic Echocardiography  Patient:  Jessica Taylor, Jessica Taylor MR #:    [id number] Study Date: 01/17/2016 Gender:   F Age:    89 Height:   160 cm Weight:   75.9 kg BSA:    1.86 m^2 Pt. Status: Room:    [id number]  SONOGRAPHER Joanie Coddington, RDCS PERFORMING  [facility], Inpatient ATTENDING  Leo Grosser ADMITTING  Tamala Julian, Rondell A ORDERING   Smith, Rondell A  cc:  ------------------------------------------------------------------- LV EF: 55% -  60%  ------------------------------------------------------------------- Indications:   Chest pain 786.51.  ------------------------------------------------------------------- History:  PMH:  Coronary artery disease. Risk factors: Hypertension.  Dyslipidemia.  ------------------------------------------------------------------- Study Conclusions  - Left ventricle: The cavity size was normal. Systolic function was normal. The estimated ejection fraction was in the range of 55% to 60%. Wall motion was normal; there were no regional wall motion abnormalities. Features are consistent with a pseudonormal left ventricular filling pattern, with concomitant abnormal relaxation and increased filling pressure (grade 2 diastolic dysfunction). Doppler parameters are consistent with high ventricular filling pressure.  Transthoracic echocardiography. M-mode, complete 2D, spectral Doppler, and color Doppler. Birthdate: Patient birthdate: January 03, 1941. Age: Patient is 75 yr old. Sex: Gender: female. BMI: 29.6 kg/m^2. Blood pressure:   140/60 Patient status: Inpatient. Study date: Study date: 01/17/2016. Study time: 02:49 PM. Location: Bedside.  -------------------------------------------------------------------  ------------------------------------------------------------------- Left ventricle: The cavity size was normal. Systolic function was normal. The estimated ejection fraction was in the range of 55% to 60%. Wall motion was normal; there were no regional wall motion abnormalities. Features are consistent with a pseudonormal left ventricular filling pattern, with concomitant abnormal relaxation and increased filling pressure (grade 2 diastolic dysfunction). Doppler parameters are consistent with high ventricular filling pressure.  ------------------------------------------------------------------- Aortic valve:  Trileaflet; normal thickness leaflets. Mobility was not restricted. Doppler: Transvalvular velocity was within the normal range. There was no stenosis. There was no regurgitation.  ------------------------------------------------------------------- Aorta: Aortic root: The aortic root was  normal in size.  ------------------------------------------------------------------- Mitral valve:  Structurally normal valve.  Mobility was not restricted. Doppler: Transvalvular velocity was within the normal range. There was no evidence for stenosis. There was no regurgitation.  Peak gradient (D): 3 mm Hg.  ------------------------------------------------------------------- Left atrium: The atrium was normal in size.  ------------------------------------------------------------------- Right ventricle: The cavity size was normal. Wall thickness was normal. Systolic function was normal.  ------------------------------------------------------------------- Pulmonic valve:  Structurally normal valve.  Cusp separation was normal. Doppler: Transvalvular velocity was within the normal range. There was no evidence for stenosis. There was no regurgitation.  ------------------------------------------------------------------- Tricuspid valve:  Structurally normal valve.  Doppler: Transvalvular velocity was within the normal range. There was no regurgitation.  ------------------------------------------------------------------- Pulmonary artery:  The main pulmonary artery was normal-sized. Systolic pressure could not be accurately estimated.  ------------------------------------------------------------------- Right atrium: The atrium was normal in size.  ------------------------------------------------------------------- Pericardium: There was no pericardial effusion.  ------------------------------------------------------------------- Systemic veins: Inferior vena cava: The vessel was normal in size.  ------------------------------------------------------------------- Measurements  Left ventricle              Value    Reference LV ID, ED, PLAX chordal     (L)   38.3 mm   43 - 52 LV ID, ES, PLAX chordal  28.3 mm    23 - 38 LV fx shortening, PLAX chordal  (L)   26  %   >=29 LV PW thickness, ED           11.2 mm   --------- IVS/LV PW ratio, ED           0.93     <=1.3 LV ejection fraction, 1-p A4C      61  %   --------- LV end-diastolic volume, 2-p       66  ml   --------- LV end-systolic volume, 2-p       26  ml   --------- LV ejection fraction, 2-p        60  %   --------- Stroke volume, 2-p            40  ml   --------- LV end-diastolic volume/bsa, 2-p     35  ml/m^2 --------- LV end-systolic volume/bsa, 2-p     14  ml/m^2 --------- Stroke volume/bsa, 2-p          21.4 ml/m^2 --------- LV e&', lateral              7.62 cm/s  --------- LV E/e&', lateral             11.33    --------- LV s&', lateral              6   cm/s  --------- LV e&', medial              4.73 cm/s  --------- LV E/e&', medial             18.25    --------- LV e&', average              6.18 cm/s  --------- LV E/e&', average             13.98    ---------  Ventricular septum            Value    Reference IVS thickness, ED            10.4 mm   ---------  LVOT                   Value    Reference LVOT ID, S                20  mm   --------- LVOT area                3.14 cm^2  ---------  Aorta                  Value    Reference Aortic root ID, ED            28  mm   ---------  Left atrium               Value    Reference LA ID, A-P, ES              30  mm   --------- LA ID/bsa, A-P              1.61 cm/m^2 <=2.2 LA volume, S               40.2 ml   --------- LA volume/bsa, S              21.6 ml/m^2 --------- LA volume, ES, 1-p A4C  34.9 ml   --------- LA volume/bsa, ES, 1-p A4C        18.7 ml/m^2 --------- LA volume, ES, 1-p A2C          39.6 ml   --------- LA volume/bsa, ES, 1-p A2C        21.3 ml/m^2 ---------  Mitral valve               Value    Reference Mitral E-wave peak velocity       86.3 cm/s  --------- Mitral A-wave peak velocity       98  cm/s  --------- Mitral deceleration time     (H)   243  ms   150 - 230 Mitral peak gradient, D         3   mm Hg --------- Mitral E/A ratio, peak          0.9     ---------  Systemic veins              Value    Reference Estimated CVP              3   mm Hg ---------  Right ventricle             Value    Reference TAPSE                  23.3 mm   --------- RV s&', lateral, S            10  cm/s  ---------  Legend: (L) and (H) mark values outside specified reference range.  ------------------------------------------------------------------- Prepared and Electronically Authenticated by  Fransico Him, MD 2017-02-24T16:24:30    Peter M Martinique, MD (Primary)      Procedures    Left Heart Cath and Coronary Angiography    Conclusion     Prox RCA to Mid RCA lesion, 40% stenosed.  Mid RCA lesion, 50% stenosed.  Dist RCA lesion, 95% stenosed. The lesion was previously treated with a stent (unknown type).  Ost Ramus to Ramus lesion, 65% stenosed. The lesion was previously treated with a stent (unknown type) greater than two years ago.  Ost 1st Mrg to 1st Mrg lesion, 60% stenosed.  Prox LAD lesion, 40% stenosed. The lesion was previously treated with a stent (unknown type).  Mid LAD-2 lesion, 50% stenosed. The lesion was previously treated with a stent (unknown type)  greater than two years ago.  Mid LAD-1 lesion, 90% stenosed.  1. Severe 3 vessel obstructive CAD.   - 90% proximal to mid LAD between prior stents. This segment is diffusely diseased and calcified. It is an acutely angulated segment.   - moderate diffuse disease in the ramus intermediate- in-stent  - severe 95% distal RCA at distal margin of old stent. 2. Normal LVEDP.  Plan: I would recommend consideration of CABG. Patient has complex CAD with multiple old stents and de novo and in stent disease. We could consider high risk PCI but I think her best option may be surgical revascularization at this point. I discussed with Dr. Marlou Porch. Will hold Plavix. CT surgery consult.      Indications    Unstable angina ([facility]) [I20.0 (ICD-10-CM)]    Technique and Indications    Indication: 75 yo WF presents with unstable angina. She is s/p multiple stents including proximal and mid LAD, ramus intermediate, distal LCx, and distal RCA.  Procedural Details: The right wrist was prepped, draped, and anesthetized with 1% lidocaine. Using the  modified Seldinger technique, a 6 French slender sheath was introduced into the right radial artery. 3 mg of verapamil was administered through the sheath, weight-based unfractionated heparin was administered intravenously. Standard Judkins catheters were used for selective coronary angiography and left ventricular pressures. Catheter exchanges were performed over an exchange length guidewire. There were no immediate procedural complications. A TR band was used for radial hemostasis at the completion of the procedure. The patient was transferred to the post catheterization recovery area for further monitoring.  Contrast: 30 cc  During this procedure the patient is administered a total of Versed 2 mg and Fentanyl 25 mg to achieve and maintain moderate conscious sedation. The patient's heart rate, blood pressure, and oxygen saturation are monitored continuously  during the procedure. The period of conscious sedation is 17 minutes, of which I was present face-to-face 100% of this time.  Estimated blood loss <50 mL. There were no immediate complications during the procedure.    Coronary Findings    Dominance: Right   Left Anterior Descending   . Prox LAD lesion, 40% stenosed. Diffuse. The lesion was previously treated with a stent (unknown type).   . Mid LAD-1 lesion, 90% stenosed. Moderately Calcified diffuse.   . Mid LAD-2 lesion, 50% stenosed. The lesion was previously treated with a stent (unknown type) greater than two years ago.     Ramus Intermedius   . Ost Ramus to Ramus lesion, 65% stenosed. Diffuse. The lesion was previously treated with a stent (unknown type) greater than two years ago.     Left Circumflex   . Prox Cx to Mid Cx lesion, 0% stenosed. Previously placed Prox Cx to Mid Cx drug eluting stent is patent.   . First Obtuse Marginal Branch   . Ost 1st Mrg to 1st Mrg lesion, 60% stenosed. Discrete.     Right Coronary Artery   . Prox RCA to Mid RCA lesion, 40% stenosed. Moderately Calcified diffuse.   . Mid RCA lesion, 50% stenosed. Discrete.   . Dist RCA lesion, 95% stenosed. Discrete. The lesion was previously treated with a stent (unknown type).   . First Right Posterolateral   The vessel is small in size.   Marland Kitchen Second Right Posterolateral   The vessel is small in size.      Coronary Diagrams    Diagnostic Diagram            Implants     No implant documentation for this case.    PACS Images    Show images for Cardiac catheterization     Link to Procedure Log    Procedure Log      Hemo Data       Most Recent Value   AO Systolic Pressure  789 mmHg   AO Diastolic Pressure  53 mmHg   AO Mean  82 mmHg   LV Systolic Pressure  381 mmHg   LV Diastolic Pressure  3 mmHg   LV EDP  10 mmHg   Arterial Occlusion Pressure Extended Systolic Pressure  017 mmHg   Arterial Occlusion Pressure  Extended Diastolic Pressure  54 mmHg   Arterial Occlusion Pressure Extended Mean Pressure  83 mmHg   Left Ventricular Apex Extended Systolic Pressure  510 mmHg   Left Ventricular Apex Extended Diastolic Pressure  0 mmHg   Left Ventricular [place] Extended EDP Pressure  10 mmHg     Assessment/Plan:  She has severe multi-vessel coronary disease with high grade proximal to mid LAD stenosis between previous stents,  a tight distal RCA stenosis at the distal end of a stent and a long moderate stenosis in the large Ramus branch with unstable anginal symptoms. I agree that CABG is the best treatment for this patient. She received her last dose of Plavix yesterday so ideally I would like to wait 5 days to do surgery. I will check a P2Y12 level to help with timing of surgery, either Friday afternoon or Monday morning. She has stage III CKD so we will need to follow her creat for a couple days post-cath before surgery. I discussed the operative procedure with the patient and her husband, who I did CABG on previously, including alternatives, benefits and risks; including but not limited to bleeding, blood transfusion, infection, stroke, myocardial infarction, graft failure, heart block requiring a permanent pacemaker, organ dysfunction, and death.  [name] understands and agrees to proceed.    Fernande Boyden Bartle 01/21/2016, 11:09 AM

## 2016-01-21 NOTE — Progress Notes (Signed)
Patient Demographics:    Jessica Taylor, is a 75 y.o. female, DOB - 1941-02-01, [id number]  Admit date - 01/16/2016   Admitting Physician Norval Morton, MD  Outpatient Primary MD for the patient is [name], MD  LOS -    Chief Complaint  Patient presents with  . Chest Pain        Subjective:    Jessica Taylor today has, No headache, No chest pain, No abdominal pain - No Nausea, No new weakness tingling or numbness, No Cough - SOB. Feels fine.   Assessment  & Plan :    1. Chest pain in a patient with CAD. Ruled out for MI with negative troponin 3, EKG nonacute, currently pain-free, on aspirin, Plavix, statin and beta blocker for secondary prevention, cardiology following she is post left heart cath on 01/20/2016 showing severe multi-vessel coronary disease with high grade proximal to mid LAD stenosis between, cardiology has consulted cardiothoracic surgery for further management, will await their input. Patient is currently pain-free.  2. Dyslipidemia. On high intensity statin, LDL is above goal, however she is 75 year old and female, will defer cardiology on increasing the dose of statin.  3. Hypothyroidism. Continue home dose Synthroid.  4. GERD. Continue PPI.  5. Essential hypertension. On Norvasc and beta blocker continue.  6.CKD 3 - creatinine close to baseline of 1.2.    Code Status : Full  Family Communication  : None  Disposition Plan  : Stay inpatient  Consults  : Cards, TCTS  Procedures  :   Left heart cath 01/20/2016 - Severe multi-vessel coronary disease with high grade proximal to mid LAD stenosis between.   DVT Prophylaxis  :    Heparin gtt  Lab Results  Component Value Date   PLT 144* 01/21/2016    Inpatient Medications  Scheduled Meds: . amLODipine   5 mg Oral Daily  . aspirin EC  81 mg Oral Daily  . atorvastatin  80 mg Oral Daily  . levothyroxine  50 mcg Oral QAC breakfast  . metoprolol tartrate  25 mg Oral BID  . pantoprazole  80 mg Oral Q1200  . sodium chloride flush  3 mL Intravenous Q12H   Continuous Infusions: . heparin 1,000 Units/hr (01/20/16 2348)   PRN Meds:.sodium chloride, acetaminophen, gi cocktail, HYDROcodone-acetaminophen, ketotifen, morphine injection, ondansetron (ZOFRAN) IV, sodium chloride flush  Antibiotics  :     Anti-infectives    None        Objective:   Filed Vitals:   01/20/16 1900 01/20/16 2000 01/20/16 2100 01/21/16 0500  BP: 119/56 138/55 122/57 130/54  Pulse: 57 83  52  Temp:  99 F (37.2 C)  97.8 F (36.6 C)  TempSrc:      Resp: 17 22  18   Height:      Weight:    74.98 kg (165 lb 4.8 oz)  SpO2: 97% 96%  96%    Wt Readings from Last 3 Encounters:  01/21/16 74.98 kg (165 lb 4.8 oz)  09/09/15 75.751 kg (167 lb)  08/02/15 74.277 kg (163 lb 12 oz)     Intake/Output Summary (Last 24 hours) at 01/21/16 1137 Last data filed at 01/21/16 0820  Gross per 24 hour  Intake    600 ml  Output    850 ml  Net   -250 ml     Physical Exam  Awake Alert, Oriented X 3, No new F.N deficits, Normal affect [place].AT,PERRAL Supple Neck,No JVD, No cervical lymphadenopathy appriciated.  Symmetrical Chest wall movement, Good air movement bilaterally, CTAB RRR,No Gallops,Rubs or new Murmurs, No Parasternal Heave +ve B.Sounds, Abd Soft, No tenderness, No organomegaly appriciated, No rebound - guarding or rigidity. No Cyanosis, Clubbing or edema, No new Rash or bruise       Data Review:   Micro Results No results found for this or any previous visit (from the past 240 hour(s)).  Radiology Reports Dg Chest 2 View  01/16/2016  CLINICAL DATA:  Two-day history of chest pain. EXAM: CHEST  2 VIEW COMPARISON:  03/06/2015 FINDINGS: The lungs are clear wiithout focal pneumonia, edema, pneumothorax or pleural  effusion. The cardiopericardial silhouette is within normal limits for size. The visualized bony structures of the thorax are intact. IMPRESSION: No active cardiopulmonary disease. Electronically Signed   By: Misty Stanley M.D.   On: 01/16/2016 20:20     CBC  Recent Labs Lab 01/16/16 2040 01/18/16 0525 01/19/16 0451 01/20/16 0451 01/21/16 0602  WBC 6.7 5.7 5.9 5.9 5.9  HGB 13.8 13.1 13.5 12.7 12.8  HCT 44.0 42.1 44.4 41.2 42.3  PLT 169 161 171 159 144*  MCV 88.5 87.9 88.1 87.8 88.7  MCH 27.8 27.3 26.8 27.1 26.8  MCHC 31.4 31.1 30.4 30.8 30.3  RDW 15.4 15.4 15.7* 15.7* 15.8*  LYMPHSABS 1.2  --   --   --   --   MONOABS 0.4  --   --   --   --   EOSABS 0.1  --   --   --   --   BASOSABS 0.0  --   --   --   --     Chemistries   Recent Labs Lab 01/16/16 2040 01/20/16 0451 01/21/16 0602  NA 142 143 144  K 4.8 4.1 4.2  CL 108 107 110  CO2 23 23 25   GLUCOSE 92 113* 109*  BUN 22* 23* 18  CREATININE 1.37* 1.43* 1.33*  CALCIUM 9.6 9.7 9.5   ------------------------------------------------------------------------------------------------------------------ No results for input(s): CHOL, HDL, LDLCALC, TRIG, CHOLHDL, LDLDIRECT in the last 72 hours.  Lab Results  Component Value Date   HGBA1C 5.9* 03/06/2015   ------------------------------------------------------------------------------------------------------------------ No results for input(s): TSH, T4TOTAL, T3FREE, THYROIDAB in the last 72 hours.  Invalid input(s): FREET3 ------------------------------------------------------------------------------------------------------------------ No results for input(s): VITAMINB12, FOLATE, FERRITIN, TIBC, IRON, RETICCTPCT in the last 72 hours.  Coagulation profile  Recent Labs Lab 01/17/16 1140 01/18/16 0525 01/19/16 0451  INR 1.15 1.09 1.02    No results for input(s): DDIMER in the last 72 hours.  Cardiac Enzymes  Recent Labs Lab 01/17/16 0224 01/17/16 0531  01/17/16 0716  TROPONINI <0.03 <0.03 <0.03   ------------------------------------------------------------------------------------------------------------------    Component Value Date/Time   BNP 67.3 02/08/2015 2337    Time Spent in minutes  35   Nan Maya K M.D on 01/21/2016 at 11:37 AM  Between 7am to 7pm - Pager - [phone]  After 7pm go to [email or web address] - password [facility]  [facility] -  Office  [phone]

## 2016-01-21 NOTE — Progress Notes (Signed)
ANTICOAGULATION CONSULT NOTE - Follow Up Consult  Pharmacy Consult for Heparin Indication: chest pain/ACS  Allergies  Allergen Reactions  . Shrimp [Shellfish Allergy] Nausea And Vomiting    Throws up violently    Patient Measurements: Height: 5\' 3"  (160 cm) Weight: 165 lb 4.8 oz (74.98 kg) IBW/kg (Calculated) : 52.4 Heparin Dosing Weight: 70 kg  Vital Signs: Temp: 97.8 F (36.6 C) (02/28 0500) BP: 130/54 mmHg (02/28 0500) Pulse Rate: 52 (02/28 0500)  Labs:  Recent Labs  01/19/16 0451 01/19/16 1422 01/20/16 0451 01/21/16 0602  HGB 13.5  --  12.7 12.8  HCT 44.4  --  41.2 42.3  PLT 171  --  159 144*  LABPROT 13.6  --   --   --   INR 1.02  --   --   --   HEPARINUNFRC 0.80* 0.65 0.63 0.43  CREATININE  --   --  1.43* 1.33*    Estimated Creatinine Clearance: 34.9 mL/min (by C-G formula based on Cr of 1.33).   Medications:  Scheduled:  . amLODipine  5 mg Oral Daily  . aspirin EC  81 mg Oral Daily  . atorvastatin  80 mg Oral Daily  . levothyroxine  50 mcg Oral QAC breakfast  . metoprolol tartrate  25 mg Oral BID  . pantoprazole  80 mg Oral Q1200  . sodium chloride flush  3 mL Intravenous Q12H   Infusions:  . heparin 1,000 Units/hr (01/20/16 2348)    Assessment: 75 yo F with hx CAD presented to the ED 2/24 with recurrent CP.  Pt was started on heparin infusion with plans for cardiac cath Monday. Heparin level was therapeutic x 2 on 1000 un/hr. Pt now s/p cath and heparin restarted ~8 hours post sheath removal. Restarted at 1000 un/hr and morning level therapeutic at 0.43 (~ 6 hr level). No bleeding noted.   Goal of Therapy:  Heparin level 0.3-0.7 units/ml Monitor platelets by anticoagulation protocol: Yes   Plan:  - Continue heparin gtt at 1000 units/hr - F/u daily heparin level and CBC   Thank you for allowing [place] to participate in this patients care. Jens Som, PharmD Pager: [phone] 01/21/2016 9:44 AM

## 2016-01-21 NOTE — Progress Notes (Signed)
Subjective:  No recurrent chest pain at rest.  Objective:   Vital Signs : Filed Vitals:   01/20/16 2000 01/20/16 2100 01/21/16 0500 01/21/16 1426  BP: 138/55 122/57 130/54 129/63  Pulse: 83  52 60  Temp: 99 F (37.2 C)  97.8 F (36.6 C) 97.7 F (36.5 C)  TempSrc:    Oral  Resp: 22  18 15   Height:      Weight:   165 lb 4.8 oz (74.98 kg)   SpO2: 96%  96% 94%    Intake/Output from previous day:  Intake/Output Summary (Last 24 hours) at 01/21/16 1732 Last data filed at 01/21/16 1300  Gross per 24 hour  Intake    840 ml  Output    550 ml  Net    290 ml    I/O since admission: +292  Wt Readings from Last 3 Encounters:  01/21/16 165 lb 4.8 oz (74.98 kg)  09/09/15 167 lb (75.751 kg)  08/02/15 163 lb 12 oz (74.277 kg)    Medications: . amLODipine  5 mg Oral Daily  . aspirin EC  81 mg Oral Daily  . atorvastatin  80 mg Oral Daily  . levothyroxine  50 mcg Oral QAC breakfast  . metoprolol tartrate  25 mg Oral BID  . pantoprazole  80 mg Oral Q1200  . sodium chloride flush  3 mL Intravenous Q12H    . heparin 1,000 Units/hr (01/20/16 2348)    Physical Exam:   General appearance: alert, cooperative and no distress Neck: no adenopathy, no carotid bruit, no JVD, supple, symmetrical, trachea midline and thyroid not enlarged, symmetric, no tenderness/mass/nodules Lungs: clear to auscultation bilaterally Heart: regular rate and rhythm Abdomen: soft, non-tender; bowel sounds normal; no masses,  no organomegaly Extremities: no edema, redness or tenderness in the calves or thighs Pulses: slightly decreased LE but extrermities are warm bilaterally Neurologic: nonfocal   Rate: 56  Rhythm: normal sinus rhythm  ECG (independently read by me): SB at 57; borderline 1st degree AV block; QTc mildly increased; no significant STT changes  Lab Results:  BMP Latest Ref Rng 01/21/2016 01/20/2016 01/16/2016  Glucose 65 - 99 mg/dL 109(H) 113(H) 92  BUN 6 - 20 mg/dL 18 23(H) 22(H)    Creatinine 0.44 - 1.00 mg/dL 1.33(H) 1.43(H) 1.37(H)  Sodium 135 - 145 mmol/L 144 143 142  Potassium 3.5 - 5.1 mmol/L 4.2 4.1 4.8  Chloride 101 - 111 mmol/L 110 107 108  CO2 22 - 32 mmol/L 25 23 23   Calcium 8.9 - 10.3 mg/dL 9.5 9.7 9.6    Hepatic Function Latest Ref Rng 03/06/2015 02/08/2015 12/28/2014  Total Protein 6.0 - 8.3 g/dL 6.3 6.4 6.9  Albumin 3.5 - 5.2 g/dL 3.9 3.3(L) 4.5  AST 0 - 37 U/L 20 15 24   ALT 0 - 35 U/L 19 11 23   Alk Phosphatase 39 - 117 U/L 82 67 86  Total Bilirubin 0.3 - 1.2 mg/dL 0.4 0.8 0.7  Bilirubin, Direct 0.0 - 0.3 mg/dL - - -     Recent Labs  01/19/16 0451 01/20/16 0451 01/21/16 0602  WBC 5.9 5.9 5.9  HGB 13.5 12.7 12.8  HCT 44.4 41.2 42.3  MCV 88.1 87.8 88.7  PLT 171 159 144*    No results for input(s): TROPONINI in the last 72 hours.  Invalid input(s): CK, MB  Lab Results  Component Value Date   TSH 0.844 03/06/2015   No results for input(s): HGBA1C in the last 72 hours.  No results for  input(s): PROT, ALBUMIN, AST, ALT, ALKPHOS, BILITOT, BILIDIR, IBILI in the last 72 hours.  Recent Labs  01/19/16 0451  INR 1.02   BNP (last 3 results)  Recent Labs  02/08/15 2337  BNP 67.3    ProBNP (last 3 results) No results for input(s): PROBNP in the last 8760 hours.   Lipid Panel     Component Value Date/Time   CHOL 177 01/17/2016 0306   TRIG 156* 01/17/2016 0306   HDL 36* 01/17/2016 0306   CHOLHDL 4.9 01/17/2016 0306   VLDL 31 01/17/2016 0306   LDLCALC 110* 01/17/2016 0306    Imaging:  No results found. ------------------------------------------------------------------- ECHO Study Conclusions  - Left ventricle: The cavity size was normal. Systolic function was normal. The estimated ejection fraction was in the range of 55% to 60%. Wall motion was normal; there were no regional wall motion abnormalities. Features are consistent with a pseudonormal left ventricular filling pattern, with concomitant  abnormal relaxation and increased filling pressure (grade 2 diastolic dysfunction). Doppler parameters are consistent with high ventricular filling pressure.  Cath   Conclusion     Prox RCA to Mid RCA lesion, 40% stenosed.  Mid RCA lesion, 50% stenosed.  Dist RCA lesion, 95% stenosed. The lesion was previously treated with a stent (unknown type).  Ost Ramus to Ramus lesion, 65% stenosed. The lesion was previously treated with a stent (unknown type) greater than two years ago.  Ost 1st Mrg to 1st Mrg lesion, 60% stenosed.  Prox LAD lesion, 40% stenosed. The lesion was previously treated with a stent (unknown type).  Mid LAD-2 lesion, 50% stenosed. The lesion was previously treated with a stent (unknown type) greater than two years ago.  Mid LAD-1 lesion, 90% stenosed.  1. Severe 3 vessel obstructive CAD.   - 90% proximal to mid LAD between prior stents. This segment is diffusely diseased and calcified. It is an acutely angulated segment.   - moderate diffuse disease in the ramus intermediate- in-stent  - severe 95% distal RCA at distal margin of old stent. 2. Normal LVEDP.  Plan: I would recommend consideration of CABG. Patient has complex CAD with multiple old stents and de novo and in stent disease. We could consider high risk PCI but I think her best option may be surgical revascularization at this point. I discussed with Dr. Marlou Porch. Will hold Plavix. CT surgery consult.          Assessment/Plan:   Principal Problem:   Unstable angina (HCC) Active Problems:   Mixed hyperlipidemia   Essential hypertension   CAD S/P prior PCI to LAD and RCA   Chest pain   Hematuria  1. Unstable angina; remains pain free. Last cath 02/2015 in the setting of acute MI with occluded Circumflex. She has known moderate disease in stented segment of other vessels. EKG without ischemic changes. Troponin negative.  On amlodipine, metoprolol.  Cath data reviewed; see above.    Last dose of Plavix was yesterday .   2. Essential HTN:  BP controlled on Rx  3. PVD: s/p PCI of bilateral iliac arteries 06/27/15 with diamondback orbital rotational atherectomy, PTA and covered stenting using ICast covered stents with resolution of claudication  ABIs normalization.   4. HLD: LDL 110; now on high dose statin.  Cr 1.33 post cath yesterday.  Continue to monitor.  Ideally hold plavix for 5 days for CABG. ? Test P2Y12 in several days to determine plavix responsiveness.   Troy Sine, MD, [facility] 01/21/2016, 5:32 PM

## 2016-01-22 ENCOUNTER — Inpatient Hospital Stay (HOSPITAL_COMMUNITY): Payer: Medicare Other

## 2016-01-22 DIAGNOSIS — I2510 Atherosclerotic heart disease of native coronary artery without angina pectoris: Secondary | ICD-10-CM

## 2016-01-22 DIAGNOSIS — I25110 Atherosclerotic heart disease of native coronary artery with unstable angina pectoris: Secondary | ICD-10-CM

## 2016-01-22 LAB — CBC
HCT: 41.6 % (ref 36.0–46.0)
Hemoglobin: 12.7 g/dL (ref 12.0–15.0)
MCH: 27.1 pg (ref 26.0–34.0)
MCHC: 30.5 g/dL (ref 30.0–36.0)
MCV: 88.9 fL (ref 78.0–100.0)
PLATELETS: 145 10*3/uL — AB (ref 150–400)
RBC: 4.68 MIL/uL (ref 3.87–5.11)
RDW: 15.9 % — AB (ref 11.5–15.5)
WBC: 6.3 10*3/uL (ref 4.0–10.5)

## 2016-01-22 LAB — PULMONARY FUNCTION TEST
DL/VA % PRED: 69 %
DL/VA: 3.28 ml/min/mmHg/L
DLCO COR: 13.18 ml/min/mmHg
DLCO UNC % PRED: 56 %
DLCO cor % pred: 57 %
DLCO unc: 12.88 ml/min/mmHg
FEF 25-75 PRE: 2.05 L/s
FEF 25-75 Post: 2.53 L/sec
FEF2575-%CHANGE-POST: 23 %
FEF2575-%PRED-PRE: 135 %
FEF2575-%Pred-Post: 167 %
FEV1-%Change-Post: 5 %
FEV1-%PRED-PRE: 104 %
FEV1-%Pred-Post: 110 %
FEV1-POST: 2.16 L
FEV1-Pre: 2.04 L
FEV1FVC-%Change-Post: 0 %
FEV1FVC-%Pred-Pre: 109 %
FEV6-%CHANGE-POST: 4 %
FEV6-%PRED-POST: 107 %
FEV6-%Pred-Pre: 102 %
FEV6-PRE: 2.53 L
FEV6-Post: 2.65 L
FEV6FVC-%PRED-PRE: 105 %
FEV6FVC-%Pred-Post: 105 %
FVC-%Change-Post: 6 %
FVC-%Pred-Post: 102 %
FVC-%Pred-Pre: 96 %
FVC-Post: 2.68 L
FVC-Pre: 2.53 L
POST FEV1/FVC RATIO: 80 %
PRE FEV1/FVC RATIO: 81 %
Post FEV6/FVC ratio: 100 %
Pre FEV6/FVC Ratio: 100 %
RV % pred: 98 %
RV: 2.25 L
TLC % PRED: 99 %
TLC: 4.85 L

## 2016-01-22 LAB — PLATELET INHIBITION P2Y12: Platelet Function  P2Y12: 218 [PRU] (ref 194–418)

## 2016-01-22 LAB — HEPARIN LEVEL (UNFRACTIONATED): HEPARIN UNFRACTIONATED: 0.57 [IU]/mL (ref 0.30–0.70)

## 2016-01-22 MED ORDER — ALBUTEROL SULFATE (2.5 MG/3ML) 0.083% IN NEBU
2.5000 mg | INHALATION_SOLUTION | Freq: Once | RESPIRATORY_TRACT | Status: AC
  Administered 2016-01-22: 2.5 mg via RESPIRATORY_TRACT

## 2016-01-22 NOTE — Plan of Care (Signed)
Problem: Education: Goal: Knowledge of [facility] Education information/materials will improve Outcome: Progressing Pt preparing for CABG, verbalized understanding of LE Dopplers and PFT tests.

## 2016-01-22 NOTE — Progress Notes (Signed)
VASCULAR LAB PRELIMINARY  PRELIMINARY  PRELIMINARY  PRELIMINARY  Pre-op Cardiac Surgery  Carotid Findings:  Bilateral:  1-39% ICA stenosis.  Vertebral artery flow is antegrade.   143  Upper Extremity Right Left  Brachial Pressures 143 Triphasuic 146 Triphasic  Radial Waveforms Triphasic Triphasic  Ulnar Waveforms Triphasic Triphasic  Palmar Arch (Allen's Test) Normal Normal   Findings:  Doppler waveforms remained normal bilaterally with both radial and ulnar compressions.    Lower  Extremity Right Left  Dorsalis Pedis 166 Triphasic 142 Triphasic  Posterior Tibial 160 Triphasic 149 triphasic  Ankle/Brachial Indices 1.14 1.02    Findings: ABIs and Doppler waveforms are within normal limits bilaterally at rest.    Jessica Taylor, RVS 01/22/2016, 1:23 PM

## 2016-01-22 NOTE — Progress Notes (Signed)
Patient Name: Jessica Taylor Date of Encounter: 01/22/2016  Primary Cardiologist: Dr. Marlou Porch / Dr. Gwenlyn Found   Principal Problem:   Unstable angina [facility]) Active Problems:   Mixed hyperlipidemia   Essential hypertension   CAD S/P prior PCI to LAD and RCA   Chest pain   Hematuria    SUBJECTIVE  Denies any CP or SOB. Occasionally had some "uncomfortable" feeling in the chest with ambulation, but not in past 24 hours  CURRENT MEDS . amLODipine  5 mg Oral Daily  . aspirin EC  81 mg Oral Daily  . atorvastatin  80 mg Oral Daily  . levothyroxine  50 mcg Oral QAC breakfast  . metoprolol tartrate  25 mg Oral BID  . pantoprazole  80 mg Oral Q1200  . sodium chloride flush  3 mL Intravenous Q12H    OBJECTIVE  Filed Vitals:   01/21/16 1426 01/21/16 2142 01/22/16 0432 01/22/16 0440  BP: 129/63 129/53 118/56   Pulse: 60 57 50   Temp: 97.7 F (36.5 C) 98 F (36.7 C) 97.8 F (36.6 C)   TempSrc: Oral Oral Oral   Resp: 15 22 17    Height:      Weight:    165 lb 12.8 oz (75.206 kg)  SpO2: 94% 95% 94%     Intake/Output Summary (Last 24 hours) at 01/22/16 1017 Last data filed at 01/22/16 0925  Gross per 24 hour  Intake    920 ml  Output    300 ml  Net    620 ml   Filed Weights   01/20/16 0500 01/21/16 0500 01/22/16 0440  Weight: 166 lb 11.2 oz (75.615 kg) 165 lb 4.8 oz (74.98 kg) 165 lb 12.8 oz (75.206 kg)    PHYSICAL EXAM  General: Pleasant, NAD. Neuro: Alert and oriented X 3. Moves all extremities spontaneously. Psych: Normal affect. HEENT:  Normal  Neck: Supple without bruits or JVD. Lungs:  Resp regular and unlabored, CTA. Heart: RRR no s3, s4, or murmurs. Abdomen: Soft, non-tender, non-distended, BS + x 4.  Extremities: No clubbing, cyanosis or edema. DP/PT/Radials 2+ and equal bilaterally.  Accessory Clinical Findings  CBC  Recent Labs  01/21/16 0602 01/22/16 0421  WBC 5.9 6.3  HGB 12.8 12.7  HCT 42.3 41.6  MCV 88.7 88.9  PLT 144* [id number]*   Basic Metabolic  Panel  Recent Labs  01/20/16 0451 01/21/16 0602  NA 143 144  K 4.1 4.2  CL 107 110  CO2 23 25  GLUCOSE 113* 109*  BUN 23* 18  CREATININE 1.43* 1.33*  CALCIUM 9.7 9.5    TELE Sinus brady with HR 50s    ECG  No new EKG  Echocardiogram 01/17/2016  LV EF: 55% -  60%  ------------------------------------------------------------------- Indications:   Chest pain 786.51.  ------------------------------------------------------------------- History:  PMH:  Coronary artery disease. Risk factors: Hypertension. Dyslipidemia.  ------------------------------------------------------------------- Study Conclusions  - Left ventricle: The cavity size was normal. Systolic function was normal. The estimated ejection fraction was in the range of 55% to 60%. Wall motion was normal; there were no regional wall motion abnormalities. Features are consistent with a pseudonormal left ventricular filling pattern, with concomitant abnormal relaxation and increased filling pressure (grade 2 diastolic dysfunction). Doppler parameters are consistent with high ventricular filling pressure.    Radiology/Studies  Dg Chest 2 View  01/16/2016  CLINICAL DATA:  Two-day history of chest pain. EXAM: CHEST  2 VIEW COMPARISON:  03/06/2015 FINDINGS: The lungs are clear wiithout focal pneumonia, edema, pneumothorax  or pleural effusion. The cardiopericardial silhouette is within normal limits for size. The visualized bony structures of the thorax are intact. IMPRESSION: No active cardiopulmonary disease. Electronically Signed   By: Misty Stanley M.D.   On: 01/16/2016 20:20    ASSESSMENT AND PLAN  1. Unstable angina  - echo 01/17/2016 EF 55-60%, grade 2 DD, no RWMA  - cath 01/20/2016 40% prox to mid RCA, 50% mid RCA, 95% dist RCA, 65% ost Ramus, 60% OM1, 40% prox LAD, 50% mid LAD, 90% mid LAD. Pending workup for CABG  - seen by Dr. Cyndia Bent 01/21/2016, plavix on hold, last dose of plavix  2/27  2. CAD  3. PVD: s/p PCI of bilateral iliac arteries 06/27/15 with diamondback orbital rotational atherectomy, PTA and covered stenting using ICast covered stents with resolution of claudication ABIs normalization.   4. HTN 5. HLD: continue 80mg  lipitor  Signed, Woodward Ku Pager: [phone]  Patient seen and examined. Agree with assessment and plan. No recurrent chest pain. Continue heparin. Plavix on hold for upcoming CABG. Timing per Dr. Cyndia Bent. ? P2Y12 later this week.    Troy Sine, MD, [facility] 01/22/2016 12:23 PM

## 2016-01-22 NOTE — Progress Notes (Addendum)
Patient Demographics:    Jessica Taylor, is a 75 y.o. female, DOB - 1941-08-03, [id number]  Admit date - 01/16/2016   Admitting Physician Norval Morton, MD  Outpatient Primary MD for the patient is [name], MD  LOS -   Summary - Jessica Taylor is a 75 year old with past medical history significant for Hypothyroidism, HLD, CKD stage III, MI, AAA, PAD, CAD s/p PCI; who presents with complaints of chest pain, seen by Cards +ve L.Heart Cath for multi vessel CAD, TCTS consulted likely CABG Friday or coming Monday, Plavix on hold, patient symptom free.   Chief Complaint  Patient presents with  . Chest Pain        Subjective:    Jessica Taylor today has, No headache, No chest pain, No abdominal pain - No Nausea, No new weakness tingling or numbness, No Cough - SOB. Feels fine.   Assessment  & Plan :    1. Chest pain in a patient with CAD. Ruled out for MI with negative troponin 3, EKG nonacute, currently pain-free, on aspirin, Hep gtt, statin and beta blocker for secondary prevention, cardiology following, she is post left heart cath on 01/20/2016 showing severe multi-vessel coronary disease with high grade proximal to mid LAD stenosis between, cardiology has consulted cardiothoracic surgery for further management, likely CABG Friday or Monday, hold Plavix, pending P2Y12 levels, vein mapping today . Patient is currently pain-free.  2. Dyslipidemia. On high intensity statin, LDL is above goal, however she is 76 year old and female, will defer cardiology on increasing the dose of statin.  3. Hypothyroidism. Continue home dose Synthroid.  4. GERD. Continue PPI.  5. Essential hypertension. On Norvasc and beta blocker continue.  6.CKD 3 - creatinine close to baseline of 1.2.    Code Status :  Full  Family Communication  : None  Disposition Plan  : Stay inpatient  Consults  : Cards, TCTS  Procedures  :   Left heart cath 01/20/2016 - Severe multi-vessel coronary disease with high grade proximal to mid LAD stenosis between.  Vein Mapping due 01-22-16  CABG pending   DVT Prophylaxis  :    Heparin gtt  Lab Results  Component Value Date   PLT 145* 01/22/2016    Inpatient Medications  Scheduled Meds: . albuterol  2.5 mg Nebulization Once  . amLODipine  5 mg Oral Daily  . aspirin EC  81 mg Oral Daily  . atorvastatin  80 mg Oral Daily  . levothyroxine  50 mcg Oral QAC breakfast  . metoprolol tartrate  25 mg Oral BID  . pantoprazole  80 mg Oral Q1200  . sodium chloride flush  3 mL Intravenous Q12H   Continuous Infusions: . heparin 1,000 Units/hr (01/21/16 2335)   PRN Meds:.sodium chloride, acetaminophen, gi cocktail, HYDROcodone-acetaminophen, ketotifen, morphine injection, ondansetron (ZOFRAN) IV, sodium chloride flush  Antibiotics  :     Anti-infectives    None        Objective:   Filed Vitals:   01/21/16 1426 01/21/16 2142 01/22/16 0432 01/22/16 0440  BP: 129/63 129/53 118/56   Pulse: 60 57 50   Temp: 97.7 F (36.5 C) 98 F (36.7 C) 97.8 F (36.6 C)   TempSrc: Oral Oral Oral   Resp: 15 22  17   Height:      Weight:    75.206 kg (165 lb 12.8 oz)  SpO2: 94% 95% 94%     Wt Readings from Last 3 Encounters:  01/22/16 75.206 kg (165 lb 12.8 oz)  09/09/15 75.751 kg (167 lb)  08/02/15 74.277 kg (163 lb 12 oz)     Intake/Output Summary (Last 24 hours) at 01/22/16 [id number] Last data filed at 01/22/16 0700  Gross per 24 hour  Intake    560 ml  Output    300 ml  Net    260 ml     Physical Exam  Awake Alert, Oriented X 3, No new F.N deficits, Normal affect [place].AT,PERRAL Supple Neck,No JVD, No cervical lymphadenopathy appriciated.  Symmetrical Chest wall movement, Good air movement bilaterally, CTAB RRR,No Gallops,Rubs or new Murmurs, No  Parasternal Heave +ve B.Sounds, Abd Soft, No tenderness, No organomegaly appriciated, No rebound - guarding or rigidity. No Cyanosis, Clubbing or edema, No new Rash or bruise       Data Review:   Micro Results No results found for this or any previous visit (from the past 240 hour(s)).  Radiology Reports Dg Chest 2 View  01/16/2016  CLINICAL DATA:  Two-day history of chest pain. EXAM: CHEST  2 VIEW COMPARISON:  03/06/2015 FINDINGS: The lungs are clear wiithout focal pneumonia, edema, pneumothorax or pleural effusion. The cardiopericardial silhouette is within normal limits for size. The visualized bony structures of the thorax are intact. IMPRESSION: No active cardiopulmonary disease. Electronically Signed   By: Misty Stanley M.D.   On: 01/16/2016 20:20     CBC  Recent Labs Lab 01/16/16 2040 01/18/16 0525 01/19/16 0451 01/20/16 0451 01/21/16 0602 01/22/16 0421  WBC 6.7 5.7 5.9 5.9 5.9 6.3  HGB 13.8 13.1 13.5 12.7 12.8 12.7  HCT 44.0 42.1 44.4 41.2 42.3 41.6  PLT 169 161 171 159 144* 145*  MCV 88.5 87.9 88.1 87.8 88.7 88.9  MCH 27.8 27.3 26.8 27.1 26.8 27.1  MCHC 31.4 31.1 30.4 30.8 30.3 30.5  RDW 15.4 15.4 15.7* 15.7* 15.8* 15.9*  LYMPHSABS 1.2  --   --   --   --   --   MONOABS 0.4  --   --   --   --   --   EOSABS 0.1  --   --   --   --   --   BASOSABS 0.0  --   --   --   --   --     Chemistries   Recent Labs Lab 01/16/16 2040 01/20/16 0451 01/21/16 0602  NA 142 143 144  K 4.8 4.1 4.2  CL 108 107 110  CO2 23 23 25   GLUCOSE 92 113* 109*  BUN 22* 23* 18  CREATININE 1.37* 1.43* 1.33*  CALCIUM 9.6 9.7 9.5   ------------------------------------------------------------------------------------------------------------------ No results for input(s): CHOL, HDL, LDLCALC, TRIG, CHOLHDL, LDLDIRECT in the last 72 hours.  Lab Results  Component Value Date   HGBA1C 5.9* 03/06/2015    ------------------------------------------------------------------------------------------------------------------ No results for input(s): TSH, T4TOTAL, T3FREE, THYROIDAB in the last 72 hours.  Invalid input(s): FREET3 ------------------------------------------------------------------------------------------------------------------ No results for input(s): VITAMINB12, FOLATE, FERRITIN, TIBC, IRON, RETICCTPCT in the last 72 hours.  Coagulation profile  Recent Labs Lab 01/17/16 1140 01/18/16 0525 01/19/16 0451  INR 1.15 1.09 1.02    No results for input(s): DDIMER in the last 72 hours.  Cardiac Enzymes  Recent Labs Lab 01/17/16 0224 01/17/16 0531 01/17/16 0716  TROPONINI <0.03 <0.03 <0.03   ------------------------------------------------------------------------------------------------------------------  Component Value Date/Time   BNP 67.3 02/08/2015 2337    Time Spent in minutes  35   SINGH,PRASHANT K M.D on 01/22/2016 at 8:22 AM  Between 7am to 7pm - Pager - [phone]  After 7pm go to [email or web address] - password [email or web address]  [facility] -  Office  [phone]

## 2016-01-22 NOTE — Progress Notes (Signed)
ANTICOAGULATION CONSULT NOTE - Follow Up Consult  Pharmacy Consult for Heparin Indication: chest pain/ACS  Allergies  Allergen Reactions  . Shrimp [Shellfish Allergy] Nausea And Vomiting    Throws up violently    Patient Measurements: Height: 5\' 3"  (160 cm) Weight: 165 lb 12.8 oz (75.206 kg) IBW/kg (Calculated) : 52.4 Heparin Dosing Weight: 70 kg  Vital Signs: Temp: 97.8 F (36.6 C) (03/01 0432) Temp Source: Oral (03/01 0432) BP: 118/56 mmHg (03/01 0432) Pulse Rate: 50 (03/01 0432)  Labs:  Recent Labs  01/20/16 0451 01/21/16 0602 01/22/16 0421  HGB 12.7 12.8 12.7  HCT 41.2 42.3 41.6  PLT 159 144* 145*  HEPARINUNFRC 0.63 0.43 0.57  CREATININE 1.43* 1.33*  --     Estimated Creatinine Clearance: 34.9 mL/min (by C-G formula based on Cr of 1.33).   Medications:  Scheduled:  . amLODipine  5 mg Oral Daily  . aspirin EC  81 mg Oral Daily  . atorvastatin  80 mg Oral Daily  . levothyroxine  50 mcg Oral QAC breakfast  . metoprolol tartrate  25 mg Oral BID  . pantoprazole  80 mg Oral Q1200  . sodium chloride flush  3 mL Intravenous Q12H   Infusions:  . heparin 1,000 Units/hr (01/21/16 2335)    Assessment: 75 yo F with hx CAD presented to the ED 2/24 with recurrent CP.  Pt was started on heparin infusion with plans for cardiac cath Monday. S/p cath on 2/28, awaiting CABG either Friday or Monday - now heparin restarted and remains therapeutic (0.57) on 1000 units/h. Hg wnl, plt low stable. No bleed documented.  Goal of Therapy:  Heparin level 0.3-0.7 units/ml Monitor platelets by anticoagulation protocol: Yes   Plan:  - Continue heparin gtt at 1000 units/hr - F/u daily heparin level and CBC - Mon s/sx bleeding - CABG for 3/3 or 3/6  Elicia Lamp, PharmD, [facility] Clinical Pharmacist Pager [phone] 01/22/2016 8:29 AM

## 2016-01-22 NOTE — Progress Notes (Signed)
2 Days Post-Op Procedure(s) (LRB): Left Heart Cath and Coronary Angiography (N/A) Subjective:  No complaints. Did not have any chest pain or shortness of breath today.  Objective: Vital signs in last 24 hours: Temp:  [97.8 F (36.6 C)-98.2 F (36.8 C)] 98.2 F (36.8 C) (03/01 1445) Pulse Rate:  [50-57] 55 (03/01 1445) Cardiac Rhythm:  [-] Heart block (03/01 0745) Resp:  [17-22] 18 (03/01 1445) BP: (106-129)/(48-56) 106/48 mmHg (03/01 1445) SpO2:  [93 %-95 %] 93 % (03/01 1445) Weight:  [75.206 kg (165 lb 12.8 oz)] 75.206 kg (165 lb 12.8 oz) (03/01 0440)  Hemodynamic parameters for last 24 hours:    Intake/Output from previous day: 02/28 0701 - 03/01 0700 In: 800 [P.O.:680; I.V.:120] Out: 300 [Urine:300] Intake/Output this shift: Total I/O In: 600 [P.O.:600] Out: -   General appearance: alert and cooperative Heart: regular rate and rhythm, S1, S2 normal, no murmur, click, rub or gallop Lungs: clear to auscultation bilaterally Extremities: extremities normal, atraumatic, no cyanosis or edema  Lab Results:  Recent Labs  01/21/16 0602 01/22/16 0421  WBC 5.9 6.3  HGB 12.8 12.7  HCT 42.3 41.6  PLT 144* 145*   BMET:  Recent Labs  01/20/16 0451 01/21/16 0602  NA 143 144  K 4.1 4.2  CL 107 110  CO2 23 25  GLUCOSE 113* 109*  BUN 23* 18  CREATININE 1.43* 1.33*  CALCIUM 9.7 9.5    PT/INR: No results for input(s): LABPROT, INR in the last 72 hours. ABG No results found for: PHART, HCO3, TCO2, ACIDBASEDEF, O2SAT CBG (last 3)  No results for input(s): GLUCAP in the last 72 hours.  Assessment/Plan:  Severe multi-vessel coronary disease with unstable angina. Her P2Y12 this am was 218 so I think we can proceed with surgery on Friday afternoon. She is in agreement.   LOS: 1 day    Gaye Pollack 01/22/2016

## 2016-01-23 ENCOUNTER — Inpatient Hospital Stay (HOSPITAL_COMMUNITY): Payer: Medicare Other

## 2016-01-23 DIAGNOSIS — R319 Hematuria, unspecified: Secondary | ICD-10-CM

## 2016-01-23 LAB — CBC
HCT: 41.4 % (ref 36.0–46.0)
Hemoglobin: 12.6 g/dL (ref 12.0–15.0)
MCH: 27.2 pg (ref 26.0–34.0)
MCHC: 30.4 g/dL (ref 30.0–36.0)
MCV: 89.4 fL (ref 78.0–100.0)
PLATELETS: 143 10*3/uL — AB (ref 150–400)
RBC: 4.63 MIL/uL (ref 3.87–5.11)
RDW: 16 % — ABNORMAL HIGH (ref 11.5–15.5)
WBC: 5.7 10*3/uL (ref 4.0–10.5)

## 2016-01-23 LAB — BLOOD GAS, ARTERIAL
Acid-Base Excess: 0.5 mmol/L (ref 0.0–2.0)
BICARBONATE: 24.4 meq/L — AB (ref 20.0–24.0)
DRAWN BY: 44589
FIO2: 0.21
O2 SAT: 97 %
PH ART: 7.427 (ref 7.350–7.450)
PO2 ART: 92.7 mmHg (ref 80.0–100.0)
Patient temperature: 98.6
TCO2: 25.5 mmol/L (ref 0–100)
pCO2 arterial: 37.6 mmHg (ref 35.0–45.0)

## 2016-01-23 LAB — BASIC METABOLIC PANEL
ANION GAP: 7 (ref 5–15)
BUN: 27 mg/dL — AB (ref 6–20)
CO2: 26 mmol/L (ref 22–32)
Calcium: 9.6 mg/dL (ref 8.9–10.3)
Chloride: 108 mmol/L (ref 101–111)
Creatinine, Ser: 1.4 mg/dL — ABNORMAL HIGH (ref 0.44–1.00)
GFR calc Af Amer: 41 mL/min — ABNORMAL LOW (ref >60)
GFR calc non Af Amer: 35 mL/min — ABNORMAL LOW (ref >60)
GLUCOSE: 110 mg/dL — AB (ref 65–99)
POTASSIUM: 4.3 mmol/L (ref 3.5–5.1)
Sodium: 141 mmol/L (ref 135–145)

## 2016-01-23 LAB — SURGICAL PCR SCREEN
MRSA, PCR: NEGATIVE
Staphylococcus aureus: NEGATIVE

## 2016-01-23 LAB — URINALYSIS, ROUTINE W REFLEX MICROSCOPIC
Bilirubin Urine: NEGATIVE
Glucose, UA: NEGATIVE mg/dL
Ketones, ur: NEGATIVE mg/dL
LEUKOCYTES UA: NEGATIVE
Nitrite: NEGATIVE
PROTEIN: NEGATIVE mg/dL
SPECIFIC GRAVITY, URINE: 1.011 (ref 1.005–1.030)
pH: 6.5 (ref 5.0–8.0)

## 2016-01-23 LAB — HEPARIN LEVEL (UNFRACTIONATED): HEPARIN UNFRACTIONATED: 0.7 [IU]/mL (ref 0.30–0.70)

## 2016-01-23 LAB — URINE MICROSCOPIC-ADD ON

## 2016-01-23 LAB — PROTIME-INR
INR: 1.06 (ref 0.00–1.49)
PROTHROMBIN TIME: 14 s (ref 11.6–15.2)

## 2016-01-23 MED ORDER — SODIUM CHLORIDE 0.9 % IV SOLN
INTRAVENOUS | Status: DC
  Filled 2016-01-23: qty 2.5

## 2016-01-23 MED ORDER — METOPROLOL TARTRATE 12.5 MG HALF TABLET
12.5000 mg | ORAL_TABLET | Freq: Once | ORAL | Status: AC
  Administered 2016-01-24: 12.5 mg via ORAL
  Filled 2016-01-23: qty 1

## 2016-01-23 MED ORDER — EPINEPHRINE HCL 1 MG/ML IJ SOLN
0.0000 ug/min | INTRAVENOUS | Status: DC
  Filled 2016-01-23: qty 4

## 2016-01-23 MED ORDER — CHLORHEXIDINE GLUCONATE CLOTH 2 % EX PADS: 6.0000 | MEDICATED_PAD | Freq: Once | CUTANEOUS | Status: AC

## 2016-01-23 MED ORDER — PHENYLEPHRINE HCL 10 MG/ML IJ SOLN
30.0000 ug/min | INTRAVENOUS | Status: DC
  Filled 2016-01-23: qty 2

## 2016-01-23 MED ORDER — DEXTROSE 5 % IV SOLN
1.5000 g | INTRAVENOUS | Status: DC
  Filled 2016-01-23: qty 1.5

## 2016-01-23 MED ORDER — VANCOMYCIN HCL 10 G IV SOLR
1250.0000 mg | INTRAVENOUS | Status: DC
  Filled 2016-01-23: qty 1250

## 2016-01-23 MED ORDER — NITROGLYCERIN IN D5W 200-5 MCG/ML-% IV SOLN
2.0000 ug/min | INTRAVENOUS | Status: DC
  Filled 2016-01-23: qty 250

## 2016-01-23 MED ORDER — DOCUSATE SODIUM 100 MG PO CAPS
100.0000 mg | ORAL_CAPSULE | Freq: Every day | ORAL | Status: DC | PRN
  Administered 2016-01-23: 100 mg via ORAL
  Filled 2016-01-23: qty 1

## 2016-01-23 MED ORDER — METOPROLOL TARTRATE 12.5 MG HALF TABLET
12.5000 mg | ORAL_TABLET | Freq: Two times a day (BID) | ORAL | Status: DC
  Administered 2016-01-23: 12.5 mg via ORAL
  Filled 2016-01-23: qty 1

## 2016-01-23 MED ORDER — PLASMA-LYTE 148 IV SOLN
INTRAVENOUS | Status: DC
  Filled 2016-01-23: qty 2.5

## 2016-01-23 MED ORDER — SODIUM CHLORIDE 0.9 % IV SOLN
INTRAVENOUS | Status: DC
  Filled 2016-01-23: qty 30

## 2016-01-23 MED ORDER — POTASSIUM CHLORIDE 2 MEQ/ML IV SOLN
80.0000 meq | INTRAVENOUS | Status: DC
  Filled 2016-01-23: qty 40

## 2016-01-23 MED ORDER — DEXMEDETOMIDINE HCL IN NACL 400 MCG/100ML IV SOLN
0.1000 ug/kg/h | INTRAVENOUS | Status: DC
  Filled 2016-01-23: qty 100

## 2016-01-23 MED ORDER — BISACODYL 5 MG PO TBEC
5.0000 mg | DELAYED_RELEASE_TABLET | Freq: Once | ORAL | Status: AC
  Administered 2016-01-23: 5 mg via ORAL
  Filled 2016-01-23: qty 1

## 2016-01-23 MED ORDER — MAGNESIUM SULFATE 50 % IJ SOLN
40.0000 meq | INTRAMUSCULAR | Status: DC
  Filled 2016-01-23: qty 10

## 2016-01-23 MED ORDER — TEMAZEPAM 15 MG PO CAPS: 15.0000 mg | ORAL_CAPSULE | Freq: Once | ORAL | Status: DC | PRN

## 2016-01-23 MED ORDER — DIAZEPAM 2 MG PO TABS
2.0000 mg | ORAL_TABLET | Freq: Once | ORAL | Status: AC
  Administered 2016-01-24: 2 mg via ORAL
  Filled 2016-01-23: qty 1

## 2016-01-23 MED ORDER — CHLORHEXIDINE GLUCONATE 0.12 % MT SOLN
15.0000 mL | Freq: Once | OROMUCOSAL | Status: AC
  Administered 2016-01-24: 15 mL via OROMUCOSAL
  Filled 2016-01-23: qty 15

## 2016-01-23 MED ORDER — DOPAMINE-DEXTROSE 3.2-5 MG/ML-% IV SOLN
0.0000 ug/kg/min | INTRAVENOUS | Status: DC
  Filled 2016-01-23: qty 250

## 2016-01-23 MED ORDER — SODIUM CHLORIDE 0.9 % IV SOLN
INTRAVENOUS | Status: DC
  Filled 2016-01-23: qty 40

## 2016-01-23 MED ORDER — CHLORHEXIDINE GLUCONATE CLOTH 2 % EX PADS
6.0000 | MEDICATED_PAD | Freq: Once | CUTANEOUS | Status: AC
  Administered 2016-01-23: 6 via TOPICAL

## 2016-01-23 MED ORDER — DEXTROSE 5 % IV SOLN
750.0000 mg | INTRAVENOUS | Status: DC
  Filled 2016-01-23: qty 750

## 2016-01-23 NOTE — Progress Notes (Signed)
ANTICOAGULATION CONSULT NOTE - Follow Up Consult  Pharmacy Consult for Heparin Indication: chest pain/ACS  Allergies  Allergen Reactions  . Shrimp [Shellfish Allergy] Nausea And Vomiting    Throws up violently    Patient Measurements: Height: 5\' 3"  (160 cm) Weight: 165 lb 3.2 oz (74.934 kg) IBW/kg (Calculated) : 52.4 Heparin Dosing Weight: 70 kg  Vital Signs: Temp: 97.9 F (36.6 C) (03/02 0517) Temp Source: Oral (03/02 0517) BP: 139/63 mmHg (03/02 0517) Pulse Rate: 53 (03/02 0517)  Labs:  Recent Labs  01/21/16 0602 01/22/16 0421 01/23/16 0526  HGB 12.8 12.7 12.6  HCT 42.3 41.6 41.4  PLT 144* 145* 143*  HEPARINUNFRC 0.43 0.57 0.70  CREATININE 1.33*  --  1.40*    Estimated Creatinine Clearance: 33.1 mL/min (by C-G formula based on Cr of 1.4).   Medications:  Scheduled:  . amLODipine  5 mg Oral Daily  . aspirin EC  81 mg Oral Daily  . atorvastatin  80 mg Oral Daily  . levothyroxine  50 mcg Oral QAC breakfast  . metoprolol tartrate  25 mg Oral BID  . pantoprazole  80 mg Oral Q1200  . sodium chloride flush  3 mL Intravenous Q12H   Infusions:  . heparin 1,000 Units/hr (01/23/16 0121)    Assessment: 75 yo F with hx CAD presented to the ED 2/24 with recurrent CP.  Pt was started on heparin infusion with plans for cardiac cath Monday. S/p cath on 2/27, awaiting CABG likely 3/3. Heparin restarted and remains therapeutic (0.7) on 1000 units/h. Hg wnl, plt low stable. No bleed documented.  Goal of Therapy:  Heparin level 0.3-0.7 units/ml Monitor platelets by anticoagulation protocol: Yes   Plan:  - Continue heparin gtt at 1000 units/hr - F/u daily heparin level and CBC - Mon s/sx bleeding - CABG for 3/3  Elicia Lamp, PharmD, [facility] Clinical Pharmacist Pager [phone] 01/23/2016 8:27 AM

## 2016-01-23 NOTE — Progress Notes (Signed)
3 Days Post-Op Procedure(s) (LRB): Left Heart Cath and Coronary Angiography (N/A) Subjective:  No complaints  Objective: Vital signs in last 24 hours: Temp:  [97.8 F (36.6 C)-98.3 F (36.8 C)] 98.1 F (36.7 C) (03/02 1708) Pulse Rate:  [53-60] 58 (03/02 1708) Cardiac Rhythm:  [-] Sinus bradycardia (03/02 0828) Resp:  [16] 16 (03/01 2347) BP: (115-141)/(53-87) 141/63 mmHg (03/02 1708) SpO2:  [94 %-100 %] 99 % (03/02 1708) Weight:  [74.934 kg (165 lb 3.2 oz)] 74.934 kg (165 lb 3.2 oz) (03/02 0517)  Hemodynamic parameters for last 24 hours:    Intake/Output from previous day: 03/01 0701 - 03/02 0700 In: 1070 [P.O.:840; I.V.:230] Out: -  Intake/Output this shift: Total I/O In: 240 [P.O.:240] Out: -   General appearance: alert and cooperative Heart: regular rate and rhythm, S1, S2 normal, no murmur, click, rub or gallop Lungs: clear to auscultation bilaterally  Lab Results:  Recent Labs  01/22/16 0421 01/23/16 0526  WBC 6.3 5.7  HGB 12.7 12.6  HCT 41.6 41.4  PLT 145* 143*   BMET:  Recent Labs  01/21/16 0602 01/23/16 0526  NA 144 141  K 4.2 4.3  CL 110 108  CO2 25 26  GLUCOSE 109* 110*  BUN 18 27*  CREATININE 1.33* 1.40*  CALCIUM 9.5 9.6    PT/INR:  Recent Labs  01/23/16 1114  LABPROT 14.0  INR 1.06   ABG    Component Value Date/Time   PHART 7.427 01/23/2016 1030   HCO3 24.4* 01/23/2016 1030   TCO2 25.5 01/23/2016 1030   O2SAT 97.0 01/23/2016 1030   CBG (last 3)  No results for input(s): GLUCAP in the last 72 hours.  Assessment/Plan:  Stable for CABG tomorrow afternoon. She and her husband have no further questions.    LOS: 2 days    Gaye Pollack 01/23/2016

## 2016-01-23 NOTE — Progress Notes (Signed)
Patient Name: Jessica Taylor Date of Encounter: 01/23/2016  Primary Cardiologist: Dr. Marlou Porch / Dr. Gwenlyn Found   Principal Problem:   Unstable angina [facility]) Active Problems:   Mixed hyperlipidemia   Essential hypertension   CAD S/P prior PCI to LAD and RCA   Chest pain   Hematuria   CAD (coronary artery disease)   Coronary artery disease involving native coronary artery of native heart without angina pectoris    SUBJECTIVE  Denies any CP or SOB. Occasionally had some "uncomfortable" feeling in the chest with ambulation. Says she has not ambulated much here.   CURRENT MEDS . amLODipine  5 mg Oral Daily  . aspirin EC  81 mg Oral Daily  . atorvastatin  80 mg Oral Daily  . levothyroxine  50 mcg Oral QAC breakfast  . metoprolol tartrate  12.5 mg Oral BID  . pantoprazole  80 mg Oral Q1200  . sodium chloride flush  3 mL Intravenous Q12H    OBJECTIVE  Filed Vitals:   01/22/16 2347 01/23/16 0517 01/23/16 0835 01/23/16 0840  BP: 127/58 139/63 115/53   Pulse: 57 53 55   Temp: 97.8 F (36.6 C) 97.9 F (36.6 C)  98.3 F (36.8 C)  TempSrc: Oral Oral  Oral  Resp: 16     Height:      Weight:  165 lb 3.2 oz (74.934 kg)    SpO2: 94% 96%  100%    Intake/Output Summary (Last 24 hours) at 01/23/16 0934 Last data filed at 01/23/16 0841  Gross per 24 hour  Intake    950 ml  Output      0 ml  Net    950 ml   Filed Weights   01/21/16 0500 01/22/16 0440 01/23/16 0517  Weight: 165 lb 4.8 oz (74.98 kg) 165 lb 12.8 oz (75.206 kg) 165 lb 3.2 oz (74.934 kg)    PHYSICAL EXAM  General: Pleasant, NAD. Neuro: Alert and oriented X 3. Moves all extremities spontaneously. Psych: Normal affect. HEENT:  Normal  Neck: Supple without bruits or JVD. Lungs:  Resp regular and unlabored, CTA. Heart: RRR no s3, s4, or murmurs. Abdomen: Soft, non-tender, non-distended, BS + x 4.  Extremities: No clubbing, cyanosis or edema. DP/PT/Radials 2+ and equal bilaterally.  Accessory Clinical  Findings  CBC  Recent Labs  01/22/16 0421 01/23/16 0526  WBC 6.3 5.7  HGB 12.7 12.6  HCT 41.6 41.4  MCV 88.9 89.4  PLT 145* [id number]*   Basic Metabolic Panel  Recent Labs  01/21/16 0602 01/23/16 0526  NA 144 141  K 4.2 4.3  CL 110 108  CO2 25 26  GLUCOSE 109* 110*  BUN 18 27*  CREATININE 1.33* 1.40*  CALCIUM 9.5 9.6    TELE Sinus brady with HR high 40s-50s    ECG  No new EKG  Echocardiogram 01/17/2016  LV EF: 55% -  60%  ------------------------------------------------------------------- Indications:   Chest pain 786.51.  ------------------------------------------------------------------- History:  PMH:  Coronary artery disease. Risk factors: Hypertension. Dyslipidemia.  ------------------------------------------------------------------- Study Conclusions  - Left ventricle: The cavity size was normal. Systolic function was normal. The estimated ejection fraction was in the range of 55% to 60%. Wall motion was normal; there were no regional wall motion abnormalities. Features are consistent with a pseudonormal left ventricular filling pattern, with concomitant abnormal relaxation and increased filling pressure (grade 2 diastolic dysfunction). Doppler parameters are consistent with high ventricular filling pressure.    Radiology/Studies  Dg Chest 2 View  01/16/2016  CLINICAL DATA:  Two-day history of chest pain. EXAM: CHEST  2 VIEW COMPARISON:  03/06/2015 FINDINGS: The lungs are clear wiithout focal pneumonia, edema, pneumothorax or pleural effusion. The cardiopericardial silhouette is within normal limits for size. The visualized bony structures of the thorax are intact. IMPRESSION: No active cardiopulmonary disease. Electronically Signed   By: Misty Stanley M.D.   On: 01/16/2016 20:20    ASSESSMENT AND PLAN  1. Unstable angina  - echo 01/17/2016 EF 55-60%, grade 2 DD, no RWMA  - cath 01/20/2016 40% prox to mid RCA, 50% mid RCA, 95%  dist RCA, 65% ost Ramus, 60% OM1, 40% prox LAD, 50% mid LAD, 90% mid LAD. Pending workup for CABG  - seen by Dr. Cyndia Bent 01/21/2016, plavix on hold, last dose of plavix 2/27  - bradycardic this morning in high 40s to 50s, I have cut down metoprolol from 25mg  BID to 12.5mg  BID.  2. CAD  3. PVD: s/p PCI of bilateral iliac arteries 06/27/15 with diamondback orbital rotational atherectomy, PTA and covered stenting using ICast covered stents with resolution of claudication ABIs normalization.   4. HTN 5. HLD: continue 80mg  lipitor  Signed, Woodward Ku Pager: [phone]   Patient seen and examined. Agree with assessment and plan. No recurrent chest pain.  Metoprolol to be reduced with bradycardia.  Plan for CABG tomorrow.   Troy Sine, MD, [facility] 01/23/2016 12:04 PM

## 2016-01-23 NOTE — Progress Notes (Signed)
Patient Demographics:    Jessica Taylor, is a 75 y.o. female, DOB - 1941-08-26, [id number]  Admit date - 01/16/2016   Admitting Physician Norval Morton, MD  Outpatient Primary MD for the patient is Jessica Kroner, MD  LOS -   Summary - [name] is a 75 year old with past medical history significant for Hypothyroidism, HLD, CKD stage III, MI, AAA, PAD, CAD s/p PCI; who presents with complaints of chest pain, seen by Cards +ve L.Heart Cath for multi vessel CAD, TCTS consulted likely CABG Friday or coming Monday, Plavix on hold, patient symptom free.      Subjective:    [name] today has, No headache, No chest pain, No abdominal pain - No Nausea, No new weakness tingling or numbness, No Cough - SOB. Feels fine.   Assessment  & Plan :    1. Chest pain in a patient with CAD. Ruled out for MI with negative troponin 3, EKG nonacute, currently pain-free, on aspirin, Hep gtt, statin and beta blocker for secondary prevention, cardiology following, she is post left heart cath on 01/20/2016 showing severe multi-vessel coronary disease with high grade proximal to mid LAD stenosis between, cardiology has consulted cardiothoracic surgery for further management, likely CABG Friday or Monday, hold Plavix, pending P2Y12 levels, vein mapping today . Patient is currently pain-free.  2. Dyslipidemia. On high intensity statin, LDL is above goal, however she is 75 year old and female, will defer cardiology on increasing the dose of statin.  3. Hypothyroidism. Continue home dose Synthroid.  4. GERD. Continue PPI.  5. Essential hypertension. On Norvasc and beta blocker continue.  6.CKD 3 - creatinine close to baseline of 1.2.    Code Status : Full  Family Communication  : None  Disposition Plan  : Stay  inpatient  Consults  : Cards, TCTS  Procedures  :   Left heart cath 01/20/2016 - Severe multi-vessel coronary disease with high grade proximal to mid LAD stenosis between.  Vein Mapping due 01-22-16  CABG pending   DVT Prophylaxis  :    Heparin gtt  Lab Results  Component Value Date   PLT 143* 01/23/2016    Inpatient Medications  Scheduled Meds: . amLODipine  5 mg Oral Daily  . aspirin EC  81 mg Oral Daily  . atorvastatin  80 mg Oral Daily  . levothyroxine  50 mcg Oral QAC breakfast  . metoprolol tartrate  12.5 mg Oral BID  . pantoprazole  80 mg Oral Q1200  . sodium chloride flush  3 mL Intravenous Q12H   Continuous Infusions: . heparin 1,000 Units/hr (01/23/16 0121)   PRN Meds:.sodium chloride, acetaminophen, docusate sodium, gi cocktail, HYDROcodone-acetaminophen, ketotifen, morphine injection, ondansetron (ZOFRAN) IV, sodium chloride flush  Antibiotics  :     Anti-infectives    None        Objective:   Filed Vitals:   01/22/16 2347 01/23/16 0517 01/23/16 0835 01/23/16 0840  BP: 127/58 139/63 115/53   Pulse: 57 53 55   Temp: 97.8 F (36.6 C) 97.9 F (36.6 C)  98.3 F (36.8 C)  TempSrc: Oral Oral  Oral  Resp: 16     Height:      Weight:  74.934 kg (165 lb 3.2 oz)  SpO2: 94% 96%  100%    Wt Readings from Last 3 Encounters:  01/23/16 74.934 kg (165 lb 3.2 oz)  09/09/15 75.751 kg (167 lb)  08/02/15 74.277 kg (163 lb 12 oz)     Intake/Output Summary (Last 24 hours) at 01/23/16 1627 Last data filed at 01/23/16 0841  Gross per 24 hour  Intake    710 ml  Output      0 ml  Net    710 ml     Physical Exam  Awake Alert, Oriented X 3, No new F.N deficits, Normal affect [place].AT,PERRAL Supple Neck,No JVD, No cervical lymphadenopathy appriciated.  Symmetrical Chest wall movement, Good air movement bilaterally, CTAB RRR,No Gallops,Rubs or new Murmurs, No Parasternal Heave +ve B.Sounds, Abd Soft, No tenderness, No organomegaly appriciated, No rebound  - guarding or rigidity. No Cyanosis, Clubbing or edema, No new Rash or bruise       Data Review:   Micro Results Recent Results (from the past 240 hour(s))  Surgical pcr screen     Status: None   Collection Time: 01/23/16  5:06 AM  Result Value Ref Range Status   MRSA, PCR NEGATIVE NEGATIVE Final   Staphylococcus aureus NEGATIVE NEGATIVE Final    Comment:        The Xpert SA Assay (FDA approved for NASAL specimens in patients over 37 years of age), is one component of a comprehensive surveillance program.  Test performance has been validated by [facility] for patients greater than or equal to 7 year old. It is not intended to diagnose infection nor to guide or monitor treatment.     Radiology Reports Dg Chest 2 View  01/16/2016  CLINICAL DATA:  Two-day history of chest pain. EXAM: CHEST  2 VIEW COMPARISON:  03/06/2015 FINDINGS: The lungs are clear wiithout focal pneumonia, edema, pneumothorax or pleural effusion. The cardiopericardial silhouette is within normal limits for size. The visualized bony structures of the thorax are intact. IMPRESSION: No active cardiopulmonary disease. Electronically Signed   By: Misty Stanley M.D.   On: 01/16/2016 20:20     CBC  Recent Labs Lab 01/16/16 2040  01/19/16 0451 01/20/16 0451 01/21/16 0602 01/22/16 0421 01/23/16 0526  WBC 6.7  < > 5.9 5.9 5.9 6.3 5.7  HGB 13.8  < > 13.5 12.7 12.8 12.7 12.6  HCT 44.0  < > 44.4 41.2 42.3 41.6 41.4  PLT 169  < > 171 159 144* 145* 143*  MCV 88.5  < > 88.1 87.8 88.7 88.9 89.4  MCH 27.8  < > 26.8 27.1 26.8 27.1 27.2  MCHC 31.4  < > 30.4 30.8 30.3 30.5 30.4  RDW 15.4  < > 15.7* 15.7* 15.8* 15.9* 16.0*  LYMPHSABS 1.2  --   --   --   --   --   --   MONOABS 0.4  --   --   --   --   --   --   EOSABS 0.1  --   --   --   --   --   --   BASOSABS 0.0  --   --   --   --   --   --   < > = values in this interval not displayed.  Chemistries   Recent Labs Lab 01/16/16 2040 01/20/16 0451  01/21/16 0602 01/23/16 0526  NA 142 143 144 141  K 4.8 4.1 4.2 4.3  CL 108 107 110 108  CO2 23 23 25  26  GLUCOSE 92 113* 109* 110*  BUN 22* 23* 18 27*  CREATININE 1.37* 1.43* 1.33* 1.40*  CALCIUM 9.6 9.7 9.5 9.6   ------------------------------------------------------------------------------------------------------------------ No results for input(s): CHOL, HDL, LDLCALC, TRIG, CHOLHDL, LDLDIRECT in the last 72 hours.  Lab Results  Component Value Date   HGBA1C 5.9* 03/06/2015   ------------------------------------------------------------------------------------------------------------------ No results for input(s): TSH, T4TOTAL, T3FREE, THYROIDAB in the last 72 hours.  Invalid input(s): FREET3 ------------------------------------------------------------------------------------------------------------------ No results for input(s): VITAMINB12, FOLATE, FERRITIN, TIBC, IRON, RETICCTPCT in the last 72 hours.  Coagulation profile  Recent Labs Lab 01/17/16 1140 01/18/16 0525 01/19/16 0451 01/23/16 1114  INR 1.15 1.09 1.02 1.06    No results for input(s): DDIMER in the last 72 hours.  Cardiac Enzymes  Recent Labs Lab 01/17/16 0224 01/17/16 0531 01/17/16 0716  TROPONINI <0.03 <0.03 <0.03   ------------------------------------------------------------------------------------------------------------------    Component Value Date/Time   BNP 67.3 02/08/2015 2337    Time Spent in minutes  24   Diannia Hogenson M.D on 01/23/2016 at 4:27 PM  Between 7am to 7pm - Pager - [phone]  After 7pm go to [email or web address] - password [facility]  [facility] -  Office  [phone]

## 2016-01-23 NOTE — Progress Notes (Signed)
[id number] Discussed with pt importance of mobility and IS after surgery. Discussed sternal precautions. Pt stated she has bad knees and is used to using her arms a lot to assist with getting up. Would recommend PT consult after surgery to assist with mobility. Pt stated she has family who will be available after discharge 24/7. She has IS and can get to 2053ml she states. Was reading OHS booklet when I entered room. Gave [facility] care guide. Pt plans to watch discharge video later with husband.  We will follow up after surgery. Graylon Good RN BSN 01/23/2016 10:36 AM

## 2016-01-24 ENCOUNTER — Encounter (HOSPITAL_COMMUNITY): Payer: Self-pay | Admitting: Surgery

## 2016-01-24 ENCOUNTER — Inpatient Hospital Stay (HOSPITAL_COMMUNITY): Payer: Medicare Other

## 2016-01-24 ENCOUNTER — Inpatient Hospital Stay (HOSPITAL_COMMUNITY): Payer: Medicare Other | Admitting: Certified Registered"

## 2016-01-24 ENCOUNTER — Encounter (HOSPITAL_COMMUNITY)
Admission: EM | Disposition: A | Discharge status: Home / Self Care | Payer: Self-pay | Source: Home / Self Care | Attending: Surgery

## 2016-01-24 DIAGNOSIS — I25110 Atherosclerotic heart disease of native coronary artery with unstable angina pectoris: Secondary | ICD-10-CM

## 2016-01-24 HISTORY — PX: TEE WITHOUT CARDIOVERSION: SHX5443

## 2016-01-24 HISTORY — PX: CORONARY ARTERY BYPASS GRAFT: SHX141

## 2016-01-24 LAB — POCT I-STAT, CHEM 8
BUN: 22 mg/dL — ABNORMAL HIGH (ref 6–20)
BUN: 15 mg/dL (ref 6–20)
BUN: 20 mg/dL (ref 6–20)
BUN: 18 mg/dL (ref 6–20)
CALCIUM ION: 1.3 mmol/L (ref 1.13–1.30)
CHLORIDE: 101 mmol/L (ref 101–111)
CHLORIDE: 101 mmol/L (ref 101–111)
CREATININE: 0.8 mg/dL (ref 0.44–1.00)
Calcium, Ion: 0.77 mmol/L — ABNORMAL LOW (ref 1.13–1.30)
Calcium, Ion: 1.03 mmol/L — ABNORMAL LOW (ref 1.13–1.30)
Calcium, Ion: 1 mmol/L — ABNORMAL LOW (ref 1.13–1.30)
Chloride: 102 mmol/L (ref 101–111)
Chloride: 108 mmol/L (ref 101–111)
Creatinine, Ser: 0.9 mg/dL (ref 0.44–1.00)
Creatinine, Ser: 0.7 mg/dL (ref 0.44–1.00)
Creatinine, Ser: 0.9 mg/dL (ref 0.44–1.00)
GLUCOSE: 93 mg/dL (ref 65–99)
GLUCOSE: 85 mg/dL (ref 65–99)
GLUCOSE: 108 mg/dL — AB (ref 65–99)
GLUCOSE: 141 mg/dL — AB (ref 65–99)
HCT: 39 % (ref 36.0–46.0)
HCT: 23 % — ABNORMAL LOW (ref 36.0–46.0)
HEMATOCRIT: 20 % — AB (ref 36.0–46.0)
HEMATOCRIT: 26 % — AB (ref 36.0–46.0)
HEMOGLOBIN: 13.3 g/dL (ref 12.0–15.0)
HEMOGLOBIN: 6.8 g/dL — AB (ref 12.0–15.0)
HEMOGLOBIN: 8.8 g/dL — AB (ref 12.0–15.0)
Hemoglobin: 7.8 g/dL — ABNORMAL LOW (ref 12.0–15.0)
POTASSIUM: 4.1 mmol/L (ref 3.5–5.1)
POTASSIUM: 4.7 mmol/L (ref 3.5–5.1)
POTASSIUM: 5.5 mmol/L — AB (ref 3.5–5.1)
Potassium: 3.9 mmol/L (ref 3.5–5.1)
SODIUM: 132 mmol/L — AB (ref 135–145)
Sodium: 141 mmol/L (ref 135–145)
Sodium: 142 mmol/L (ref 135–145)
Sodium: 137 mmol/L (ref 135–145)
TCO2: 26 mmol/L (ref 0–100)
TCO2: 31 mmol/L (ref 0–100)
TCO2: 31 mmol/L (ref 0–100)
TCO2: 28 mmol/L (ref 0–100)

## 2016-01-24 LAB — BASIC METABOLIC PANEL WITH GFR
Anion gap: 15 (ref 5–15)
BUN: 27 mg/dL — ABNORMAL HIGH (ref 6–20)
CO2: 19 mmol/L — ABNORMAL LOW (ref 22–32)
Calcium: 9.7 mg/dL (ref 8.9–10.3)
Chloride: 105 mmol/L (ref 101–111)
Creatinine, Ser: 1.3 mg/dL — ABNORMAL HIGH (ref 0.44–1.00)
GFR calc Af Amer: 45 mL/min — ABNORMAL LOW
GFR calc non Af Amer: 39 mL/min — ABNORMAL LOW
Glucose, Bld: 105 mg/dL — ABNORMAL HIGH (ref 65–99)
Potassium: 4.8 mmol/L (ref 3.5–5.1)
Sodium: 139 mmol/L (ref 135–145)

## 2016-01-24 LAB — POCT I-STAT 3, ART BLOOD GAS (G3+)
Acid-Base Excess: 2 mmol/L (ref 0.0–2.0)
Acid-Base Excess: 5 mmol/L — ABNORMAL HIGH (ref 0.0–2.0)
Acid-Base Excess: 3 mmol/L — ABNORMAL HIGH (ref 0.0–2.0)
Acid-base deficit: 2 mmol/L (ref 0.0–2.0)
BICARBONATE: 27.4 meq/L — AB (ref 20.0–24.0)
BICARBONATE: 26.4 meq/L — AB (ref 20.0–24.0)
Bicarbonate: 26.3 mEq/L — ABNORMAL HIGH (ref 20.0–24.0)
Bicarbonate: 23.2 meq/L (ref 20.0–24.0)
O2 SAT: 100 %
O2 Saturation: 100 %
O2 Saturation: 100 %
O2 Saturation: 97 %
PH ART: 7.453 — AB (ref 7.350–7.450)
PH ART: 7.576 — AB (ref 7.350–7.450)
PH ART: 7.489 — AB (ref 7.350–7.450)
Patient temperature: 35.4
TCO2: 27 mmol/L (ref 0–100)
TCO2: 28 mmol/L (ref 0–100)
TCO2: 27 mmol/L (ref 0–100)
TCO2: 24 mmol/L (ref 0–100)
pCO2 arterial: 37.6 mmHg (ref 35.0–45.0)
pCO2 arterial: 29.4 mmHg — ABNORMAL LOW (ref 35.0–45.0)
pCO2 arterial: 34.7 mmHg — ABNORMAL LOW (ref 35.0–45.0)
pCO2 arterial: 36.6 mmHg (ref 35.0–45.0)
pH, Arterial: 7.402 (ref 7.350–7.450)
pO2, Arterial: 426 mmHg — ABNORMAL HIGH (ref 80.0–100.0)
pO2, Arterial: 415 mmHg — ABNORMAL HIGH (ref 80.0–100.0)
pO2, Arterial: 328 mmHg — ABNORMAL HIGH (ref 80.0–100.0)
pO2, Arterial: 82 mmHg (ref 80.0–100.0)

## 2016-01-24 LAB — CBC
HEMATOCRIT: 41.6 % (ref 36.0–46.0)
HEMATOCRIT: 33 % — AB (ref 36.0–46.0)
HEMOGLOBIN: 13.6 g/dL (ref 12.0–15.0)
HEMOGLOBIN: 10.3 g/dL — AB (ref 12.0–15.0)
MCH: 28.8 pg (ref 26.0–34.0)
MCH: 27.3 pg (ref 26.0–34.0)
MCHC: 32.7 g/dL (ref 30.0–36.0)
MCHC: 31.2 g/dL (ref 30.0–36.0)
MCV: 88.1 fL (ref 78.0–100.0)
MCV: 87.5 fL (ref 78.0–100.0)
Platelets: 114 10*3/uL — ABNORMAL LOW (ref 150–400)
Platelets: 106 10*3/uL — ABNORMAL LOW (ref 150–400)
RBC: 4.72 MIL/uL (ref 3.87–5.11)
RBC: 3.77 MIL/uL — AB (ref 3.87–5.11)
RDW: 15.9 % — AB (ref 11.5–15.5)
RDW: 15.6 % — ABNORMAL HIGH (ref 11.5–15.5)
WBC: 7.1 10*3/uL (ref 4.0–10.5)
WBC: 9.4 10*3/uL (ref 4.0–10.5)

## 2016-01-24 LAB — HEMOGLOBIN AND HEMATOCRIT, BLOOD
HEMATOCRIT: 26 % — AB (ref 36.0–46.0)
HEMOGLOBIN: 8.4 g/dL — AB (ref 12.0–15.0)

## 2016-01-24 LAB — PREPARE RBC (CROSSMATCH)

## 2016-01-24 LAB — POCT I-STAT 4, (NA,K, GLUC, HGB,HCT)
Glucose, Bld: 142 mg/dL — ABNORMAL HIGH (ref 65–99)
HCT: 30 % — ABNORMAL LOW (ref 36.0–46.0)
Hemoglobin: 10.2 g/dL — ABNORMAL LOW (ref 12.0–15.0)
Potassium: 4.7 mmol/L (ref 3.5–5.1)
Sodium: 138 mmol/L (ref 135–145)

## 2016-01-24 LAB — HEPARIN LEVEL (UNFRACTIONATED): Heparin Unfractionated: 0.6 IU/mL (ref 0.30–0.70)

## 2016-01-24 LAB — HEMOGLOBIN A1C
HEMOGLOBIN A1C: 5.8 % — AB (ref 4.8–5.6)
Mean Plasma Glucose: 120 mg/dL

## 2016-01-24 LAB — PROTIME-INR
INR: 1.45 (ref 0.00–1.49)
Prothrombin Time: 17.7 s — ABNORMAL HIGH (ref 11.6–15.2)

## 2016-01-24 LAB — GLUCOSE, CAPILLARY
Glucose-Capillary: 149 mg/dL — ABNORMAL HIGH (ref 65–99)
Glucose-Capillary: 146 mg/dL — ABNORMAL HIGH (ref 65–99)

## 2016-01-24 LAB — APTT: aPTT: 35 s (ref 24–37)

## 2016-01-24 LAB — PLATELET COUNT: PLATELETS: 101 10*3/uL — AB (ref 150–400)

## 2016-01-24 SURGERY — CORONARY ARTERY BYPASS GRAFTING (CABG)
Anesthesia: General | Site: Chest

## 2016-01-24 MED ORDER — CHLORHEXIDINE GLUCONATE 0.12% ORAL RINSE (MEDLINE KIT)
15.0000 mL | Freq: Two times a day (BID) | OROMUCOSAL | Status: DC
  Administered 2016-01-24 – 2016-01-25 (×2): 15 mL via OROMUCOSAL

## 2016-01-24 MED ORDER — ROCURONIUM BROMIDE 100 MG/10ML IV SOLN
INTRAVENOUS | Status: DC | PRN
  Administered 2016-01-24 (×3): 50 mg via INTRAVENOUS

## 2016-01-24 MED ORDER — MORPHINE SULFATE (PF) 2 MG/ML IV SOLN
2.0000 mg | INTRAVENOUS | Status: DC | PRN
  Administered 2016-01-25: 2 mg via INTRAVENOUS
  Filled 2016-01-24: qty 1

## 2016-01-24 MED ORDER — DOCUSATE SODIUM 100 MG PO CAPS
200.0000 mg | ORAL_CAPSULE | Freq: Every day | ORAL | Status: DC
  Administered 2016-01-25 – 2016-01-28 (×4): 200 mg via ORAL
  Filled 2016-01-24 (×4): qty 2

## 2016-01-24 MED ORDER — METOPROLOL TARTRATE 12.5 MG HALF TABLET
12.5000 mg | ORAL_TABLET | Freq: Two times a day (BID) | ORAL | Status: DC
Start: 2016-01-25 — End: 2016-01-25

## 2016-01-24 MED ORDER — PROTAMINE SULFATE 10 MG/ML IV SOLN
INTRAVENOUS | Status: DC | PRN
  Administered 2016-01-24: 200 mg via INTRAVENOUS

## 2016-01-24 MED ORDER — DEXTROSE 5 % IV SOLN
1.5000 g | Freq: Two times a day (BID) | INTRAVENOUS | Status: AC
  Administered 2016-01-24 – 2016-01-26 (×4): 1.5 g via INTRAVENOUS
  Filled 2016-01-24 (×4): qty 1.5

## 2016-01-24 MED ORDER — ARTIFICIAL TEARS OP OINT
TOPICAL_OINTMENT | OPHTHALMIC | Status: DC | PRN
  Administered 2016-01-24: 1 via OPHTHALMIC

## 2016-01-24 MED ORDER — ACETAMINOPHEN 160 MG/5ML PO SOLN
1000.0000 mg | Freq: Four times a day (QID) | ORAL | Status: DC
  Administered 2016-01-26: 1000 mg
  Filled 2016-01-24: qty 40.6

## 2016-01-24 MED ORDER — SODIUM CHLORIDE 0.9% FLUSH: 3.0000 mL | INTRAVENOUS | Status: DC | PRN

## 2016-01-24 MED ORDER — LIDOCAINE HCL (CARDIAC) 20 MG/ML IV SOLN
INTRAVENOUS | Status: AC
  Filled 2016-01-24: qty 10

## 2016-01-24 MED ORDER — ROCURONIUM BROMIDE 50 MG/5ML IV SOLN
INTRAVENOUS | Status: AC
  Filled 2016-01-24: qty 1

## 2016-01-24 MED ORDER — THROMBIN 20000 UNITS EX SOLR
CUTANEOUS | Status: AC
  Filled 2016-01-24: qty 20000

## 2016-01-24 MED ORDER — LACTATED RINGERS IV SOLN
500.0000 mL | Freq: Once | INTRAVENOUS | Status: AC | PRN
  Administered 2016-01-25: 500 mL via INTRAVENOUS

## 2016-01-24 MED ORDER — POTASSIUM CHLORIDE 10 MEQ/50ML IV SOLN
10.0000 meq | INTRAVENOUS | Status: AC
Start: 2016-01-24 — End: 2016-01-24

## 2016-01-24 MED ORDER — NITROGLYCERIN IN D5W 200-5 MCG/ML-% IV SOLN: 0.0000 ug/min | INTRAVENOUS | Status: DC

## 2016-01-24 MED ORDER — BACITRACIN ZINC 500 UNIT/GM EX OINT
TOPICAL_OINTMENT | CUTANEOUS | Status: AC
  Filled 2016-01-24: qty 28.35

## 2016-01-24 MED ORDER — SODIUM CHLORIDE 0.9% FLUSH
3.0000 mL | Freq: Two times a day (BID) | INTRAVENOUS | Status: DC
  Administered 2016-01-25 – 2016-01-29 (×9): 3 mL via INTRAVENOUS

## 2016-01-24 MED ORDER — ACETAMINOPHEN 500 MG PO TABS
1000.0000 mg | ORAL_TABLET | Freq: Four times a day (QID) | ORAL | Status: DC
  Administered 2016-01-25 – 2016-01-27 (×3): 1000 mg via ORAL
  Filled 2016-01-24 (×3): qty 2

## 2016-01-24 MED ORDER — ASPIRIN 81 MG PO CHEW: 324.0000 mg | CHEWABLE_TABLET | Freq: Every day | ORAL | Status: DC

## 2016-01-24 MED ORDER — HEMOSTATIC AGENTS (NO CHARGE) OPTIME
TOPICAL | Status: DC | PRN
  Administered 2016-01-24: 1 via TOPICAL

## 2016-01-24 MED ORDER — PROPOFOL 10 MG/ML IV BOLUS
INTRAVENOUS | Status: DC | PRN
  Administered 2016-01-24: 120 mg via INTRAVENOUS
  Administered 2016-01-24: 30 mg via INTRAVENOUS

## 2016-01-24 MED ORDER — FAMOTIDINE IN NACL 20-0.9 MG/50ML-% IV SOLN
20.0000 mg | Freq: Two times a day (BID) | INTRAVENOUS | Status: DC
  Administered 2016-01-24: 20 mg via INTRAVENOUS

## 2016-01-24 MED ORDER — BISACODYL 5 MG PO TBEC
10.0000 mg | DELAYED_RELEASE_TABLET | Freq: Every day | ORAL | Status: DC
  Administered 2016-01-25 – 2016-01-28 (×4): 10 mg via ORAL
  Filled 2016-01-24 (×4): qty 2

## 2016-01-24 MED ORDER — BISACODYL 10 MG RE SUPP: 10.0000 mg | Freq: Every day | RECTAL | Status: DC

## 2016-01-24 MED ORDER — SUCCINYLCHOLINE CHLORIDE 20 MG/ML IJ SOLN
INTRAMUSCULAR | Status: AC
  Filled 2016-01-24: qty 1

## 2016-01-24 MED ORDER — HEPARIN SODIUM (PORCINE) 1000 UNIT/ML IJ SOLN
INTRAMUSCULAR | Status: DC | PRN
  Administered 2016-01-24: 20000 [IU] via INTRAVENOUS

## 2016-01-24 MED ORDER — LACTATED RINGERS IV SOLN
INTRAVENOUS | Status: DC | PRN
  Administered 2016-01-24 (×2): via INTRAVENOUS

## 2016-01-24 MED ORDER — 0.9 % SODIUM CHLORIDE (POUR BTL) OPTIME
TOPICAL | Status: DC | PRN
  Administered 2016-01-24: 1000 mL
  Administered 2016-01-24: 5000 mL

## 2016-01-24 MED ORDER — PLASMA-LYTE 148 IV SOLN
INTRAVENOUS | Status: DC | PRN
  Administered 2016-01-24: 500 mL via INTRAVASCULAR

## 2016-01-24 MED ORDER — FENTANYL CITRATE (PF) 250 MCG/5ML IJ SOLN
INTRAMUSCULAR | Status: AC
  Filled 2016-01-24: qty 25

## 2016-01-24 MED ORDER — SODIUM CHLORIDE 0.9 % IV SOLN
250.0000 mL | INTRAVENOUS | Status: DC
Start: 2016-01-25 — End: 2016-01-29

## 2016-01-24 MED ORDER — INSULIN REGULAR BOLUS VIA INFUSION
0.0000 [IU] | Freq: Three times a day (TID) | INTRAVENOUS | Status: DC
  Filled 2016-01-24: qty 10

## 2016-01-24 MED ORDER — ROCURONIUM BROMIDE 50 MG/5ML IV SOLN
INTRAVENOUS | Status: AC
  Filled 2016-01-24: qty 2

## 2016-01-24 MED ORDER — ONDANSETRON HCL 4 MG/2ML IJ SOLN
4.0000 mg | Freq: Four times a day (QID) | INTRAMUSCULAR | Status: DC | PRN
  Administered 2016-01-25 – 2016-01-28 (×8): 4 mg via INTRAVENOUS
  Filled 2016-01-24 (×7): qty 2

## 2016-01-24 MED ORDER — TRAMADOL HCL 50 MG PO TABS
50.0000 mg | ORAL_TABLET | ORAL | Status: DC | PRN
  Administered 2016-01-27 (×3): 100 mg via ORAL
  Administered 2016-01-28 – 2016-01-29 (×3): 50 mg via ORAL
  Filled 2016-01-24: qty 1
  Filled 2016-01-24: qty 2
  Filled 2016-01-24: qty 1
  Filled 2016-01-24: qty 2
  Filled 2016-01-24: qty 1
  Filled 2016-01-24: qty 2

## 2016-01-24 MED ORDER — DEXMEDETOMIDINE HCL IN NACL 200 MCG/50ML IV SOLN
0.0000 ug/kg/h | INTRAVENOUS | Status: DC
  Administered 2016-01-24 (×2): 0.7 ug/kg/h via INTRAVENOUS
  Filled 2016-01-24: qty 50

## 2016-01-24 MED ORDER — GELATIN ABSORBABLE MT POWD
OROMUCOSAL | Status: DC | PRN
  Administered 2016-01-24: 4 mL via TOPICAL

## 2016-01-24 MED ORDER — MAGNESIUM SULFATE 4 GM/100ML IV SOLN
4.0000 g | Freq: Once | INTRAVENOUS | Status: AC
  Administered 2016-01-24: 4 g via INTRAVENOUS
  Filled 2016-01-24: qty 100

## 2016-01-24 MED ORDER — FENTANYL CITRATE (PF) 100 MCG/2ML IJ SOLN
INTRAMUSCULAR | Status: AC
  Administered 2016-01-24 (×2): 250 ug via INTRAVENOUS
  Administered 2016-01-24: 50 ug via INTRAVENOUS
  Administered 2016-01-24 (×3): 250 ug via INTRAVENOUS
  Filled 2016-01-24: qty 2

## 2016-01-24 MED ORDER — LACTATED RINGERS IV SOLN
INTRAVENOUS | Status: DC
  Administered 2016-01-24 – 2016-01-26 (×3): via INTRAVENOUS

## 2016-01-24 MED ORDER — SODIUM CHLORIDE 0.9 % IJ SOLN
INTRAMUSCULAR | Status: AC
  Filled 2016-01-24: qty 30

## 2016-01-24 MED ORDER — ANTISEPTIC ORAL RINSE SOLUTION (CORINZ)
7.0000 mL | OROMUCOSAL | Status: DC
  Administered 2016-01-25 (×5): 7 mL via OROMUCOSAL

## 2016-01-24 MED ORDER — MIDAZOLAM HCL 2 MG/2ML IJ SOLN
INTRAMUSCULAR | Status: AC
  Administered 2016-01-24: 5 mg via INTRAVENOUS
  Administered 2016-01-24: 1 mg via INTRAVENOUS
  Administered 2016-01-24: 2 mg via INTRAVENOUS
  Administered 2016-01-24: 3 mg via INTRAVENOUS
  Filled 2016-01-24: qty 2

## 2016-01-24 MED ORDER — PHENYLEPHRINE HCL 10 MG/ML IJ SOLN
0.0000 ug/min | INTRAVENOUS | Status: DC
  Administered 2016-01-24: 20 ug/min via INTRAVENOUS
  Administered 2016-01-26: 35 ug/min via INTRAVENOUS
  Filled 2016-01-24 (×2): qty 2

## 2016-01-24 MED ORDER — MORPHINE SULFATE (PF) 2 MG/ML IV SOLN: 1.0000 mg | INTRAVENOUS | Status: DC | PRN

## 2016-01-24 MED ORDER — OXYCODONE HCL 5 MG PO TABS
5.0000 mg | ORAL_TABLET | ORAL | Status: DC | PRN
  Administered 2016-01-25 – 2016-01-26 (×3): 10 mg via ORAL
  Filled 2016-01-24 (×3): qty 2

## 2016-01-24 MED ORDER — SODIUM CHLORIDE 0.9 % IV SOLN
1250.0000 mg | INTRAVENOUS | Status: DC | PRN
  Administered 2016-01-24: 1250 mg via INTRAVENOUS

## 2016-01-24 MED ORDER — DEXMEDETOMIDINE HCL IN NACL 400 MCG/100ML IV SOLN
INTRAVENOUS | Status: DC | PRN
  Administered 2016-01-24: .3 ug/kg/h via INTRAVENOUS

## 2016-01-24 MED ORDER — ASPIRIN EC 325 MG PO TBEC
325.0000 mg | DELAYED_RELEASE_TABLET | Freq: Every day | ORAL | Status: DC
Start: 2016-01-25 — End: 2016-01-29
  Administered 2016-01-25 – 2016-01-29 (×5): 325 mg via ORAL
  Filled 2016-01-24 (×5): qty 1

## 2016-01-24 MED ORDER — PROPOFOL 10 MG/ML IV BOLUS
INTRAVENOUS | Status: AC
  Filled 2016-01-24: qty 20

## 2016-01-24 MED ORDER — THROMBIN 20000 UNITS EX SOLR
OROMUCOSAL | Status: DC | PRN
  Administered 2016-01-24: 4 mL via TOPICAL

## 2016-01-24 MED ORDER — METOPROLOL TARTRATE 25 MG/10 ML ORAL SUSPENSION
12.5000 mg | Freq: Two times a day (BID) | ORAL | Status: DC
Start: 2016-01-25 — End: 2016-01-25

## 2016-01-24 MED ORDER — SODIUM CHLORIDE 0.9 % IV SOLN
INTRAVENOUS | Status: DC
  Administered 2016-01-24: 20:00:00 via INTRAVENOUS

## 2016-01-24 MED ORDER — ALBUMIN HUMAN 5 % IV SOLN
250.0000 mL | INTRAVENOUS | Status: AC | PRN
  Administered 2016-01-24 – 2016-01-25 (×4): 250 mL via INTRAVENOUS
  Filled 2016-01-24 (×2): qty 250

## 2016-01-24 MED ORDER — LIDOCAINE HCL (CARDIAC) 20 MG/ML IV SOLN
INTRAVENOUS | Status: DC | PRN
  Administered 2016-01-24: 60 mg via INTRAVENOUS

## 2016-01-24 MED ORDER — ACETAMINOPHEN 160 MG/5ML PO SOLN: 650.0000 mg | Freq: Once | ORAL | Status: AC

## 2016-01-24 MED ORDER — SODIUM CHLORIDE 0.45 % IV SOLN
INTRAVENOUS | Status: DC | PRN
  Administered 2016-01-24: 20:00:00 via INTRAVENOUS

## 2016-01-24 MED ORDER — BACITRACIN ZINC 500 UNIT/GM EX OINT
TOPICAL_OINTMENT | CUTANEOUS | Status: DC | PRN
  Administered 2016-01-24: 1 via TOPICAL

## 2016-01-24 MED ORDER — CHLORHEXIDINE GLUCONATE 0.12 % MT SOLN
15.0000 mL | OROMUCOSAL | Status: AC
  Administered 2016-01-24: 15 mL via OROMUCOSAL

## 2016-01-24 MED ORDER — NITROGLYCERIN IN D5W 200-5 MCG/ML-% IV SOLN
INTRAVENOUS | Status: DC | PRN
  Administered 2016-01-24: 5 ug/min via INTRAVENOUS

## 2016-01-24 MED ORDER — INSULIN REGULAR HUMAN 100 UNIT/ML IJ SOLN
250.0000 [IU] | INTRAMUSCULAR | Status: DC | PRN
  Administered 2016-01-24: 1 [IU]/h via INTRAVENOUS

## 2016-01-24 MED ORDER — METOPROLOL TARTRATE 1 MG/ML IV SOLN: 2.5000 mg | INTRAVENOUS | Status: DC | PRN

## 2016-01-24 MED ORDER — VANCOMYCIN HCL IN DEXTROSE 1-5 GM/200ML-% IV SOLN
1000.0000 mg | Freq: Once | INTRAVENOUS | Status: AC
  Administered 2016-01-25: 1000 mg via INTRAVENOUS
  Filled 2016-01-24: qty 200

## 2016-01-24 MED ORDER — SODIUM CHLORIDE 0.9 % IV SOLN
10.0000 g | INTRAVENOUS | Status: DC | PRN
  Administered 2016-01-24: 5 g/h via INTRAVENOUS

## 2016-01-24 MED ORDER — ACETAMINOPHEN 650 MG RE SUPP
650.0000 mg | Freq: Once | RECTAL | Status: AC
  Administered 2016-01-24: 650 mg via RECTAL

## 2016-01-24 MED ORDER — LACTATED RINGERS IV SOLN
INTRAVENOUS | Status: DC
  Administered 2016-01-24: 20:00:00 via INTRAVENOUS

## 2016-01-24 MED ORDER — PHENYLEPHRINE 40 MCG/ML (10ML) SYRINGE FOR IV PUSH (FOR BLOOD PRESSURE SUPPORT)
PREFILLED_SYRINGE | INTRAVENOUS | Status: AC
  Filled 2016-01-24: qty 10

## 2016-01-24 MED ORDER — MIDAZOLAM HCL 10 MG/2ML IJ SOLN
INTRAMUSCULAR | Status: AC
  Filled 2016-01-24: qty 2

## 2016-01-24 MED ORDER — SODIUM CHLORIDE 0.9 % IV SOLN
INTRAVENOUS | Status: DC
  Administered 2016-01-24: 0.7 [IU]/h via INTRAVENOUS
  Filled 2016-01-24: qty 2.5

## 2016-01-24 MED ORDER — DEXTROSE 5 % IV SOLN
1.5000 g | INTRAVENOUS | Status: DC | PRN
  Administered 2016-01-24: 1.5 g via INTRAVENOUS
  Administered 2016-01-24: .75 g via INTRAVENOUS

## 2016-01-24 MED ORDER — MIDAZOLAM HCL 2 MG/2ML IJ SOLN: 2.0000 mg | INTRAMUSCULAR | Status: DC | PRN

## 2016-01-24 MED ORDER — PANTOPRAZOLE SODIUM 40 MG PO TBEC
40.0000 mg | DELAYED_RELEASE_TABLET | Freq: Every day | ORAL | Status: DC
  Administered 2016-01-26 – 2016-01-27 (×2): 40 mg via ORAL
  Filled 2016-01-24 (×3): qty 1

## 2016-01-24 SURGICAL SUPPLY — 107 items
ADH SKN CLS APL DERMABOND .7 (GAUZE/BANDAGES/DRESSINGS) ×2
BAG DECANTER FOR FLEXI CONT (MISCELLANEOUS) ×3 IMPLANT
BANDAGE ACE 4X5 VEL STRL LF (GAUZE/BANDAGES/DRESSINGS) ×1 IMPLANT
BANDAGE ACE 6X5 VEL STRL LF (GAUZE/BANDAGES/DRESSINGS) ×1 IMPLANT
BANDAGE ELASTIC 4 VELCRO ST LF (GAUZE/BANDAGES/DRESSINGS) ×3 IMPLANT
BANDAGE ELASTIC 6 VELCRO ST LF (GAUZE/BANDAGES/DRESSINGS) ×3 IMPLANT
BASKET HEART (ORDER IN 25'S) (MISCELLANEOUS) ×1
BASKET HEART (ORDER IN 25S) (MISCELLANEOUS) ×2 IMPLANT
BLADE 11 SAFETY STRL DISP (BLADE) ×1 IMPLANT
BLADE STERNUM SYSTEM 6 (BLADE) ×3 IMPLANT
BNDG GAUZE ELAST 4 BULKY (GAUZE/BANDAGES/DRESSINGS) ×4 IMPLANT
CANISTER SUCTION 2500CC (MISCELLANEOUS) ×3 IMPLANT
CATH ROBINSON RED A/P 18FR (CATHETERS) ×6 IMPLANT
CATH THORACIC 28FR (CATHETERS) ×3 IMPLANT
CATH THORACIC 36FR (CATHETERS) ×3 IMPLANT
CATH THORACIC 36FR RT ANG (CATHETERS) ×3 IMPLANT
CLIP TI MEDIUM 24 (CLIP) IMPLANT
CLIP TI WIDE RED SMALL 24 (CLIP) ×2 IMPLANT
COUNTER NEEDLE 20 DBL MAG RED (NEEDLE) ×1 IMPLANT
COVER SURGICAL LIGHT HANDLE (MISCELLANEOUS) ×3 IMPLANT
CRADLE DONUT ADULT HEAD (MISCELLANEOUS) ×3 IMPLANT
DERMABOND ADVANCED (GAUZE/BANDAGES/DRESSINGS) ×1
DERMABOND ADVANCED .7 DNX12 (GAUZE/BANDAGES/DRESSINGS) IMPLANT
DRAPE CARDIOVASCULAR INCISE (DRAPES) ×3
DRAPE SLUSH/WARMER DISC (DRAPES) ×3 IMPLANT
DRAPE SRG 135X102X78XABS (DRAPES) ×2 IMPLANT
DRSG AQUACEL AG ADV 3.5X14 (GAUZE/BANDAGES/DRESSINGS) ×1 IMPLANT
DRSG COVADERM 4X14 (GAUZE/BANDAGES/DRESSINGS) ×3 IMPLANT
DRSG EMULSION OIL 3X3 NADH (GAUZE/BANDAGES/DRESSINGS) ×1 IMPLANT
DRSG MEPITEL 3X4 ME34 (GAUZE/BANDAGES/DRESSINGS) ×1 IMPLANT
ELECT CAUTERY BLADE 6.4 (BLADE) ×3 IMPLANT
ELECT REM PT RETURN 9FT ADLT (ELECTROSURGICAL) ×6
ELECTRODE REM PT RTRN 9FT ADLT (ELECTROSURGICAL) ×4 IMPLANT
FELT TEFLON 1X6 (MISCELLANEOUS) ×3 IMPLANT
GAUZE SPONGE 4X4 12PLY STRL (GAUZE/BANDAGES/DRESSINGS) ×6 IMPLANT
GLOVE BIO SURGEON STRL SZ 6 (GLOVE) IMPLANT
GLOVE BIO SURGEON STRL SZ 6.5 (GLOVE) ×4 IMPLANT
GLOVE BIO SURGEON STRL SZ7 (GLOVE) ×1 IMPLANT
GLOVE BIO SURGEON STRL SZ7.5 (GLOVE) ×1 IMPLANT
GLOVE BIOGEL PI IND STRL 6 (GLOVE) IMPLANT
GLOVE BIOGEL PI IND STRL 6.5 (GLOVE) IMPLANT
GLOVE BIOGEL PI IND STRL 7.0 (GLOVE) IMPLANT
GLOVE BIOGEL PI INDICATOR 6 (GLOVE) ×2
GLOVE BIOGEL PI INDICATOR 6.5 (GLOVE) ×8
GLOVE BIOGEL PI INDICATOR 7.0 (GLOVE) ×1
GLOVE EUDERMIC 7 POWDERFREE (GLOVE) ×6 IMPLANT
GOWN STRL REUS W/ TWL LRG LVL3 (GOWN DISPOSABLE) ×8 IMPLANT
GOWN STRL REUS W/ TWL XL LVL3 (GOWN DISPOSABLE) ×2 IMPLANT
GOWN STRL REUS W/TWL LRG LVL3 (GOWN DISPOSABLE) ×30
GOWN STRL REUS W/TWL XL LVL3 (GOWN DISPOSABLE) ×3
HEMOSTAT POWDER SURGIFOAM 1G (HEMOSTASIS) ×9 IMPLANT
HEMOSTAT SURGICEL 2X14 (HEMOSTASIS) ×3 IMPLANT
INSERT FOGARTY 61MM (MISCELLANEOUS) IMPLANT
INSERT FOGARTY XLG (MISCELLANEOUS) IMPLANT
KIT BASIN OR (CUSTOM PROCEDURE TRAY) ×3 IMPLANT
KIT CATH CPB BARTLE (MISCELLANEOUS) ×3 IMPLANT
KIT ROOM TURNOVER OR (KITS) ×3 IMPLANT
KIT SUCTION CATH 14FR (SUCTIONS) ×3 IMPLANT
KIT VASOVIEW W/TROCAR VH 2000 (KITS) ×3 IMPLANT
MARKER SKIN DUAL TIP RULER LAB (MISCELLANEOUS) ×1 IMPLANT
NS IRRIG 1000ML POUR BTL (IV SOLUTION) ×15 IMPLANT
PACK OPEN HEART (CUSTOM PROCEDURE TRAY) ×3 IMPLANT
PAD ARMBOARD 7.5X6 YLW CONV (MISCELLANEOUS) ×6 IMPLANT
PAD ELECT DEFIB RADIOL ZOLL (MISCELLANEOUS) ×3 IMPLANT
PENCIL BUTTON HOLSTER BLD 10FT (ELECTRODE) ×3 IMPLANT
PUNCH AORTIC ROTATE 4.0MM (MISCELLANEOUS) IMPLANT
PUNCH AORTIC ROTATE 4.5MM 8IN (MISCELLANEOUS) ×3 IMPLANT
PUNCH AORTIC ROTATE 5MM 8IN (MISCELLANEOUS) IMPLANT
SET CARDIOPLEGIA MPS 5001102 (MISCELLANEOUS) ×1 IMPLANT
SPONGE GAUZE 4X4 12PLY STER LF (GAUZE/BANDAGES/DRESSINGS) ×1 IMPLANT
SPONGE INTESTINAL PEANUT (DISPOSABLE) IMPLANT
SPONGE LAP 18X18 X RAY DECT (DISPOSABLE) IMPLANT
SPONGE LAP 4X18 X RAY DECT (DISPOSABLE) ×3 IMPLANT
SUT BONE WAX W31G (SUTURE) ×3 IMPLANT
SUT MNCRL AB 4-0 PS2 18 (SUTURE) ×1 IMPLANT
SUT PROLENE 3 0 SH DA (SUTURE) IMPLANT
SUT PROLENE 3 0 SH1 36 (SUTURE) ×3 IMPLANT
SUT PROLENE 4 0 RB 1 (SUTURE)
SUT PROLENE 4 0 SH DA (SUTURE) IMPLANT
SUT PROLENE 4-0 RB1 .5 CRCL 36 (SUTURE) IMPLANT
SUT PROLENE 5 0 C 1 36 (SUTURE) IMPLANT
SUT PROLENE 6 0 C 1 30 (SUTURE) ×1 IMPLANT
SUT PROLENE 7 0 BV 1 (SUTURE) IMPLANT
SUT PROLENE 7 0 BV1 MDA (SUTURE) ×4 IMPLANT
SUT PROLENE 8 0 BV175 6 (SUTURE) ×1 IMPLANT
SUT SILK  1 MH (SUTURE)
SUT SILK 1 MH (SUTURE) IMPLANT
SUT STEEL STERNAL CCS#1 18IN (SUTURE) IMPLANT
SUT STEEL SZ 6 DBL 3X14 BALL (SUTURE) ×3 IMPLANT
SUT VIC AB 1 CTX 36 (SUTURE) ×6
SUT VIC AB 1 CTX36XBRD ANBCTR (SUTURE) ×4 IMPLANT
SUT VIC AB 2-0 CT1 27 (SUTURE) ×3
SUT VIC AB 2-0 CT1 TAPERPNT 27 (SUTURE) IMPLANT
SUT VIC AB 2-0 CTX 27 (SUTURE) IMPLANT
SUT VIC AB 3-0 SH 27 (SUTURE)
SUT VIC AB 3-0 SH 27X BRD (SUTURE) IMPLANT
SUT VIC AB 3-0 X1 27 (SUTURE) IMPLANT
SUT VICRYL 4-0 PS2 18IN ABS (SUTURE) IMPLANT
SUTURE E-PAK OPEN HEART (SUTURE) ×3 IMPLANT
SYSTEM SAHARA CHEST DRAIN ATS (WOUND CARE) ×3 IMPLANT
TOWEL OR 17X24 6PK STRL BLUE (TOWEL DISPOSABLE) ×3 IMPLANT
TOWEL OR 17X26 10 PK STRL BLUE (TOWEL DISPOSABLE) ×3 IMPLANT
TRAY FOLEY IC TEMP SENS 16FR (CATHETERS) ×3 IMPLANT
TUBING ART PRESS 48 MALE/FEM (TUBING) ×2 IMPLANT
TUBING INSUFFLATION (TUBING) ×3 IMPLANT
UNDERPAD 30X30 INCONTINENT (UNDERPADS AND DIAPERS) ×3 IMPLANT
WATER STERILE IRR 1000ML POUR (IV SOLUTION) ×6 IMPLANT

## 2016-01-24 NOTE — Anesthesia Procedure Notes (Addendum)
Procedure Name: Intubation Date/Time: 01/24/2016 2:53 PM Performed by: Manuela Schwartz B Pre-anesthesia Checklist: Patient identified, Emergency Drugs available, Suction available, Patient being monitored and Timeout performed Patient Re-evaluated:Patient Re-evaluated prior to inductionOxygen Delivery Method: Circle system utilized Preoxygenation: Pre-oxygenation with 100% oxygen Intubation Type: IV induction Ventilation: Mask ventilation without difficulty Laryngoscope Size: Mac and 3 Grade View: Grade I Tube type: Oral Tube size: 8.0 mm Number of attempts: 1 Airway Equipment and Method: Stylet Placement Confirmation: ETT inserted through vocal cords under direct vision,  positive ETCO2 and breath sounds checked- equal and bilateral Secured at: 21 cm Tube secured with: Tape Dental Injury: Teeth and Oropharynx as per pre-operative assessment     Procedures: Right IJ Gordy Councilman Catheter Insertion: [id number]: The patient was identified and consent obtained.  TO was performed, and full barrier precautions were used.  The skin was anesthetized with lidocaine-4cc plain with 25g needle.  Once the vein was located with the 22 ga. needle using ultrasound guidance , the wire was inserted into the vein.  The wire location was confirmed with ultrasound.  The tissue was dilated and the 8.5 [place] cordis catheter was carefully inserted. Afterwards Gordy Councilman catheter was inserted. PA catheter at 45cm.  The patient tolerated the procedure well.

## 2016-01-24 NOTE — Care Management Important Message (Signed)
Important Message  Patient Details  Name: Jessica Taylor MRN: [id number] Date of Birth: 18-Dec-1940   Medicare Important Message Given:  Yes    [name] May 01/24/2016, 11:39 AM

## 2016-01-24 NOTE — Progress Notes (Signed)
Patient Name: Jessica Taylor Date of Encounter: 01/24/2016  Principal Problem:   Unstable angina [facility]) Active Problems:   Mixed hyperlipidemia   Essential hypertension   CAD S/P prior PCI to LAD and RCA   Chest pain   Hematuria   CAD (coronary artery disease)   Coronary artery disease involving native coronary artery of native heart without angina pectoris   Primary Cardiologist: Dr Marlou Porch, Dr Gwenlyn Found  Patient Profile: STEMI 02/2015 w/ PCI CFX, med rx for RI/LAD dz, 3.3 cm AAA, bilat iliac stents. Admit w/ cp, s/p cath 02/27 and CABG planned 03/03.   SUBJECTIVE: No chest pain, no SOB, ok for surgery  OBJECTIVE Filed Vitals:   01/23/16 1708 01/23/16 1922 01/23/16 2225 01/24/16 0318  BP: 141/63 140/60  123/57  Pulse: 58 61 68 54  Temp: 98.1 F (36.7 C) 97.8 F (36.6 C)  97.4 F (36.3 C)  TempSrc: Oral Oral  Oral  Resp:  17  16  Height:      Weight:    166 lb 3.2 oz (75.388 kg)  SpO2: 99% 98%  93%    Intake/Output Summary (Last 24 hours) at 01/24/16 0926 Last data filed at 01/24/16 0600  Gross per 24 hour  Intake  350.5 ml  Output      0 ml  Net  350.5 ml   Filed Weights   01/22/16 0440 01/23/16 0517 01/24/16 0318  Weight: 165 lb 12.8 oz (75.206 kg) 165 lb 3.2 oz (74.934 kg) 166 lb 3.2 oz (75.388 kg)    PHYSICAL EXAM General: Well developed, well nourished, female in no acute distress. Head: Normocephalic, atraumatic.  Neck: Supple without bruits, JVD not elevated. Lungs:  Resp regular and unlabored, CTA. Heart: RRR, S1, S2, no S3, S4, or murmur; no rub. Abdomen: Soft, non-tender, non-distended, BS + x 4.  Extremities: No clubbing, cyanosis, edema.  Neuro: Alert and oriented X 3. Moves all extremities spontaneously. Psych: Normal affect.  LABS: CBC: Recent Labs  01/23/16 0526 01/24/16 0415  WBC 5.7 7.1  HGB 12.6 13.6  HCT 41.4 41.6  MCV 89.4 88.1  PLT 143* 114*   INR: Recent Labs  01/23/16 1114  INR 0000000   Basic Metabolic Panel: Recent  Labs  01/23/16 0526 01/24/16 0415  NA 141 139  K 4.3 4.8  CL 108 105  CO2 26 19*  GLUCOSE 110* 105*  BUN 27* 27*  CREATININE 1.40* 1.30*  CALCIUM 9.6 9.7   Hemoglobin A1C: Recent Labs  01/23/16 1114  HGBA1C 5.8*    Lipid Panel     Component Value Date/Time   CHOL 177 01/17/2016 0306   TRIG 156* 01/17/2016 0306   HDL 36* 01/17/2016 0306   CHOLHDL 4.9 01/17/2016 0306   VLDL 31 01/17/2016 0306   LDLCALC 110* 01/17/2016 0306    TELE:  SR, S brady high 40s sustained at times, especially overnight     Radiology/Studies: Dg Chest 2 View  01/23/2016  CLINICAL DATA:  Preop CABG 01/24/2016 EXAM: CHEST  2 VIEW COMPARISON:  01/16/2016 FINDINGS: Lungs are adequately inflated without consolidation or effusion. Mild stable cardiomegaly. Evidence of a coronary stent. Moderate calcified plaque over the aortic arch. Mild degenerate change of the spine. IMPRESSION: No acute cardiopulmonary disease. Mild cardiomegaly. Electronically Signed   By: Marin Olp M.D.   On: 01/23/2016 17:01     Current Medications:  . aminocaproic acid (AMICAR) for [facility]   Intravenous To OR  . amLODipine  5 mg Oral  Daily  . aspirin EC  81 mg Oral Daily  . atorvastatin  80 mg Oral Daily  . cefUROXime (ZINACEF)  IV  1.5 g Intravenous To OR  . cefUROXime (ZINACEF)  IV  750 mg Intravenous To OR  . chlorhexidine  15 mL Mouth/Throat Once  . dexmedetomidine  0.1-0.7 mcg/kg/hr Intravenous To OR  . diazepam  2 mg Oral Once  . DOPamine  0-10 mcg/kg/min Intravenous To OR  . epinephrine  0-10 mcg/min Intravenous To OR  . heparin-papaverine-plasmalyte irrigation   Irrigation To OR  . heparin 30,000 units/NS 1000 mL solution for CELLSAVER   Other To OR  . insulin (NOVOLIN-R) infusion   Intravenous To OR  . levothyroxine  50 mcg Oral QAC breakfast  . magnesium sulfate  40 mEq Other To OR  . metoprolol tartrate  12.5 mg Oral BID  . metoprolol tartrate  12.5 mg Oral Once  . nitroGLYCERIN  2-200 mcg/min Intravenous To  OR  . pantoprazole  80 mg Oral Q1200  . phenylephrine (NEO-SYNEPHRINE) Adult infusion  30-200 mcg/min Intravenous To OR  . potassium chloride  80 mEq Other To OR  . sodium chloride flush  3 mL Intravenous Q12H  . vancomycin  1,250 mg Intravenous To OR   . heparin 1,000 Units/hr (01/24/16 0538)    ASSESSMENT AND PLAN: 1. Unstable angina - echo 01/17/2016 EF 55-60%, grade 2 DD, no RWMA - cath 01/20/2016 40% prox to mid RCA, 50% mid RCA, 95% dist RCA, 65% ost Ramus, 60% OM1, 40% prox LAD, 50% mid LAD, 90% mid LAD. Pending CABG 03/03 pm - seen by Dr. Cyndia Bent 01/21/2016, plavix on hold, last dose of plavix 2/27 - bradycardic high 40s to 50s, metoprolol 25mg  BID>>12.5mg  BID but HR still slow.  - asymptomatic, keep low-dose BB on board  2. CAD - see above  3. PVD: s/p PCI of bilateral iliac arteries 06/27/15 with diamondback orbital rotational atherectomy, PTA and covered stenting using ICast covered stents with resolution of claudication, ABIs normalization.   4. HTN - generally good control, per IM  5. HLD: continue 80mg  lipitor  Otherwise, per IM Principal Problem:   Unstable angina (HCC) Active Problems:   Mixed hyperlipidemia   Essential hypertension   CAD S/P prior PCI to LAD and RCA   Chest pain   Hematuria   CAD (coronary artery disease)   Coronary artery disease involving native coronary artery of native heart without angina pectoris   Signed, Lenoard Aden 9:26 AM 01/24/2016   Patient seen and examined. Agree with assessment and plan. No recurrent chest pain. Rhythm stable rate 54 - 68. LDL 110; need to <70 currently on atorvastatin 80 mg.  Plan CABG this afternoon.   Troy Sine, MD, [facility] 01/24/2016 11:10 AM

## 2016-01-24 NOTE — Transfer of Care (Signed)
Immediate Anesthesia Transfer of Care Note  Patient: KYNA ELLSTROM  Procedure(s) Performed: Procedure(s): CORONARY ARTERY BYPASS GRAFTING (CABG) (N/A) TRANSESOPHAGEAL ECHOCARDIOGRAM (TEE) (N/A)  Patient Location: ICU  Anesthesia Type:General  Level of Consciousness: Patient remains intubated per anesthesia plan  Airway & Oxygen Therapy: Patient remains intubated per anesthesia plan and Patient placed on Ventilator (see vital sign flow sheet for setting)  Post-op Assessment: Report given to RN and Post -op Vital signs reviewed and stable  Post vital signs: Reviewed and stable  Last Vitals:  Filed Vitals:   01/23/16 2225 01/24/16 0318  BP:  123/57  Pulse: 68 54  Temp:  36.3 C  Resp:  16    Complications: No apparent anesthesia complications

## 2016-01-24 NOTE — Progress Notes (Signed)
RT NOTE:  Pt returned from OR. RT placed on vent, ALINE, PAP plugged in

## 2016-01-24 NOTE — Progress Notes (Signed)
  Echocardiogram Echocardiogram Transesophageal has been performed.  Jessica Taylor 01/24/2016, 3:33 PM

## 2016-01-24 NOTE — Progress Notes (Signed)
ANTICOAGULATION CONSULT NOTE - Follow Up Consult  Pharmacy Consult for Heparin Indication: chest pain/ACS  Allergies  Allergen Reactions  . Shrimp [Shellfish Allergy] Nausea And Vomiting    Throws up violently    Patient Measurements: Height: 5\' 3"  (160 cm) Weight: 166 lb 3.2 oz (75.388 kg) IBW/kg (Calculated) : 52.4 Heparin Dosing Weight: 70 kg  Vital Signs: Temp: 97.4 F (36.3 C) (03/03 0318) Temp Source: Oral (03/03 0318) BP: 123/57 mmHg (03/03 0318) Pulse Rate: 54 (03/03 0318)  Labs:  Recent Labs  01/22/16 0421 01/23/16 0526 01/23/16 1114 01/24/16 0415 01/24/16 0430  HGB 12.7 12.6  --  13.6  --   HCT 41.6 41.4  --  41.6  --   PLT 145* 143*  --  114*  --   LABPROT  --   --  14.0  --   --   INR  --   --  1.06  --   --   HEPARINUNFRC 0.57 0.70  --   --  0.60  CREATININE  --  1.40*  --  1.30*  --     Estimated Creatinine Clearance: 35.8 mL/min (by C-G formula based on Cr of 1.3).   Medications:  Scheduled:  . aminocaproic acid (AMICAR) for [facility]   Intravenous To OR  . amLODipine  5 mg Oral Daily  . aspirin EC  81 mg Oral Daily  . atorvastatin  80 mg Oral Daily  . cefUROXime (ZINACEF)  IV  1.5 g Intravenous To OR  . cefUROXime (ZINACEF)  IV  750 mg Intravenous To OR  . chlorhexidine  15 mL Mouth/Throat Once  . dexmedetomidine  0.1-0.7 mcg/kg/hr Intravenous To OR  . diazepam  2 mg Oral Once  . DOPamine  0-10 mcg/kg/min Intravenous To OR  . epinephrine  0-10 mcg/min Intravenous To OR  . heparin-papaverine-plasmalyte irrigation   Irrigation To OR  . heparin 30,000 units/NS 1000 mL solution for CELLSAVER   Other To OR  . insulin (NOVOLIN-R) infusion   Intravenous To OR  . levothyroxine  50 mcg Oral QAC breakfast  . magnesium sulfate  40 mEq Other To OR  . metoprolol tartrate  12.5 mg Oral BID  . metoprolol tartrate  12.5 mg Oral Once  . nitroGLYCERIN  2-200 mcg/min Intravenous To OR  . pantoprazole  80 mg Oral Q1200  . phenylephrine (NEO-SYNEPHRINE) Adult  infusion  30-200 mcg/min Intravenous To OR  . potassium chloride  80 mEq Other To OR  . sodium chloride flush  3 mL Intravenous Q12H  . vancomycin  1,250 mg Intravenous To OR   Infusions:  . heparin 1,000 Units/hr (01/24/16 [id number])    Assessment: 75 yo F with hx CAD presented to the ED 2/24 with recurrent CP.  Pt was started on heparin infusion with plans for cardiac cath Monday. S/p cath on 2/27, awaiting CABG likely 3/3. Heparin restarted and remains therapeutic (0.6) on 1000 units/h. Hg wnl, plt down to 114. No bleed documented.  Goal of Therapy:  Heparin level 0.3-0.7 units/ml Monitor platelets by anticoagulation protocol: Yes   Plan:  - Continue heparin gtt at 1000 units/hr - F/u daily heparin level and CBC - Mon s/sx bleeding - CABG for 3/3  Elicia Lamp, PharmD, [facility] Clinical Pharmacist Pager [phone] 01/24/2016 8:21 AM

## 2016-01-24 NOTE — Progress Notes (Signed)
[facility]                                                                              Patient Demographics  Jessica Taylor, is a 75 y.o. female, DOB - [birth year], [id number]  Admit date - 01/16/2016   Admitting Physician Norval Morton, MD  Outpatient Primary MD for the patient is [name], MD  LOS - 3   Chief Complaint  Patient presents with  . Chest Pain      Interim summary Jessica Taylor is a 75 year old with past medical history significant for Hypothyroidism, HLD, CKD stage III, MI, AAA, PAD, CAD s/p PCI; who presents with complaints of chest pain, seen by Cards +ve L.Heart Cath for multi vessel CAD, TCTS consulted likely CABG Friday or coming Monday, Plavix on hold, patient symptom free.  Assessment & Plan   Chest pain/coronary artery disease -Troponin cycled and negative -currently chest pain-free -Status post left heart catheterization on 01/20/2016: Severe multivessel coronary disease with high-grade proximal to mid LAD stenosis -Cardiology as well as cardiothoracic surgery consulted and appreciated -Plan for CABG  Dyslipidemia -Continue statin  Hypothyroidism -Continue Synthroid  GERD -Continue PPI  Essential hypertension -Continue metoprolol, amlodipine  Chronic kidney disease, stage III -Creatinine currently 1.3, close to baseline -Continue to monitor BMP  Code Status: Full  Family Communication: None at bedside.  Disposition Plan: Admitted, pending CABG  Time Spent in minutes   30 minutes  Procedures  Left heart catheterization Vein mapping  Consults   Cardiology Cardiothoracic surgery  DVT Prophylaxis  heparin  Lab Results  Component Value Date   PLT 114* 01/24/2016    Medications  Scheduled Meds: . aminocaproic acid (AMICAR) for OHS   Intravenous To OR  . amLODipine  5 mg Oral Daily  . aspirin EC  81 mg Oral Daily  . atorvastatin  80 mg Oral Daily  . cefUROXime (ZINACEF)  IV  1.5 g Intravenous To OR  .  cefUROXime (ZINACEF)  IV  750 mg Intravenous To OR  . chlorhexidine  15 mL Mouth/Throat Once  . dexmedetomidine  0.1-0.7 mcg/kg/hr Intravenous To OR  . diazepam  2 mg Oral Once  . DOPamine  0-10 mcg/kg/min Intravenous To OR  . epinephrine  0-10 mcg/min Intravenous To OR  . heparin-papaverine-plasmalyte irrigation   Irrigation To OR  . heparin 30,000 units/NS 1000 mL solution for CELLSAVER   Other To OR  . insulin (NOVOLIN-R) infusion   Intravenous To OR  . levothyroxine  50 mcg Oral QAC breakfast  . magnesium sulfate  40 mEq Other To OR  . metoprolol tartrate  12.5 mg Oral BID  . metoprolol tartrate  12.5 mg Oral Once  . nitroGLYCERIN  2-200 mcg/min Intravenous To OR  . pantoprazole  80 mg Oral Q1200  . phenylephrine (NEO-SYNEPHRINE) Adult infusion  30-200 mcg/min Intravenous To OR  . potassium chloride  80 mEq Other To OR  . sodium chloride flush  3 mL Intravenous Q12H  . vancomycin  1,250 mg Intravenous To OR   Continuous Infusions: . heparin 1,000 Units/hr (01/24/16 0538)   PRN Meds:.sodium chloride, acetaminophen, docusate sodium, gi cocktail, HYDROcodone-acetaminophen, ketotifen, morphine injection, ondansetron (ZOFRAN) IV, sodium  chloride flush, temazepam  Antibiotics    Anti-infectives    Start     Dose/Rate Route Frequency Ordered Stop   01/24/16 0400  vancomycin (VANCOCIN) 1,250 mg in sodium chloride 0.9 % 250 mL IVPB     1,250 mg 166.7 mL/hr over 90 Minutes Intravenous To Surgery 01/23/16 2019 01/25/16 0400   01/24/16 0400  cefUROXime (ZINACEF) 1.5 g in dextrose 5 % 50 mL IVPB     1.5 g 100 mL/hr over 30 Minutes Intravenous To Surgery 01/23/16 2019 01/25/16 0400   01/24/16 0400  cefUROXime (ZINACEF) 750 mg in dextrose 5 % 50 mL IVPB     750 mg 100 mL/hr over 30 Minutes Intravenous To Surgery 01/23/16 2018 01/25/16 0400      Subjective:   Jessica Taylor seen and examined today.  Patient denies any chest pain or shortness of breath, dizziness or headache at this time.  Is anxious about her upcoming surgery. Denies any abdominal pain, nausea or vomiting, diarrhea or constipation.    Objective:   Filed Vitals:   01/23/16 1708 01/23/16 1922 01/23/16 2225 01/24/16 0318  BP: 141/63 140/60  123/57  Pulse: 58 61 68 54  Temp: 98.1 F (36.7 C) 97.8 F (36.6 C)  97.4 F (36.3 C)  TempSrc: Oral Oral  Oral  Resp:  17  16  Height:      Weight:    75.388 kg (166 lb 3.2 oz)  SpO2: 99% 98%  93%    Wt Readings from Last 3 Encounters:  01/24/16 75.388 kg (166 lb 3.2 oz)  09/09/15 75.751 kg (167 lb)  08/02/15 74.277 kg (163 lb 12 oz)     Intake/Output Summary (Last 24 hours) at 01/24/16 1009 Last data filed at 01/24/16 0600  Gross per 24 hour  Intake  350.5 ml  Output      0 ml  Net  350.5 ml    Exam  General: Well developed, well nourished, NAD, appears stated age  HEENT: NCAT, mucous membranes moist.   Neck: Supple, no JVD, no masses  Cardiovascular: S1 S2 auscultated, no rubs, murmurs or gallops. Regular rate and rhythm.  Respiratory: Clear to auscultation bilaterally   Abdomen: Soft, nontender, nondistended, + bowel sounds  Extremities: warm dry without cyanosis clubbing or edema  Neuro: AAOx3, nonfocal  Psych: Normal affect and demeanor     Data Review   Micro Results Recent Results (from the past 240 hour(s))  Surgical pcr screen     Status: None   Collection Time: 01/23/16  5:06 AM  Result Value Ref Range Status   MRSA, PCR NEGATIVE NEGATIVE Final   Staphylococcus aureus NEGATIVE NEGATIVE Final    Comment:        The Xpert SA Assay (FDA approved for NASAL specimens in patients over 28 years of age), is one component of a comprehensive surveillance program.  Test performance has been validated by [facility] for patients greater than or equal to 72 year old. It is not intended to diagnose infection nor to guide or monitor treatment.     Radiology Reports Dg Chest 2 View  01/23/2016  CLINICAL DATA:  Preop CABG  01/24/2016 EXAM: CHEST  2 VIEW COMPARISON:  01/16/2016 FINDINGS: Lungs are adequately inflated without consolidation or effusion. Mild stable cardiomegaly. Evidence of a coronary stent. Moderate calcified plaque over the aortic arch. Mild degenerate change of the spine. IMPRESSION: No acute cardiopulmonary disease. Mild cardiomegaly. Electronically Signed   By: Marin Olp M.D.   On:  01/23/2016 17:01   Dg Chest 2 View  01/16/2016  CLINICAL DATA:  Two-day history of chest pain. EXAM: CHEST  2 VIEW COMPARISON:  03/06/2015 FINDINGS: The lungs are clear wiithout focal pneumonia, edema, pneumothorax or pleural effusion. The cardiopericardial silhouette is within normal limits for size. The visualized bony structures of the thorax are intact. IMPRESSION: No active cardiopulmonary disease. Electronically Signed   By: Misty Stanley M.D.   On: 01/16/2016 20:20    CBC  Recent Labs Lab 01/20/16 0451 01/21/16 0602 01/22/16 0421 01/23/16 0526 01/24/16 0415  WBC 5.9 5.9 6.3 5.7 7.1  HGB 12.7 12.8 12.7 12.6 13.6  HCT 41.2 42.3 41.6 41.4 41.6  PLT 159 144* 145* 143* 114*  MCV 87.8 88.7 88.9 89.4 88.1  MCH 27.1 26.8 27.1 27.2 28.8  MCHC 30.8 30.3 30.5 30.4 32.7  RDW 15.7* 15.8* 15.9* 16.0* 15.9*    Chemistries   Recent Labs Lab 01/20/16 0451 01/21/16 0602 01/23/16 0526 01/24/16 0415  NA 143 144 141 139  K 4.1 4.2 4.3 4.8  CL 107 110 108 105  CO2 23 25 26  19*  GLUCOSE 113* 109* 110* 105*  BUN 23* 18 27* 27*  CREATININE 1.43* 1.33* 1.40* 1.30*  CALCIUM 9.7 9.5 9.6 9.7   ------------------------------------------------------------------------------------------------------------------ estimated creatinine clearance is 35.8 mL/min (by C-G formula based on Cr of 1.3). ------------------------------------------------------------------------------------------------------------------  Recent Labs  01/23/16 1114  HGBA1C 5.8*    ------------------------------------------------------------------------------------------------------------------ No results for input(s): CHOL, HDL, LDLCALC, TRIG, CHOLHDL, LDLDIRECT in the last 72 hours. ------------------------------------------------------------------------------------------------------------------ No results for input(s): TSH, T4TOTAL, T3FREE, THYROIDAB in the last 72 hours.  Invalid input(s): FREET3 ------------------------------------------------------------------------------------------------------------------ No results for input(s): VITAMINB12, FOLATE, FERRITIN, TIBC, IRON, RETICCTPCT in the last 72 hours.  Coagulation profile  Recent Labs Lab 01/17/16 1140 01/18/16 0525 01/19/16 0451 01/23/16 1114  INR 1.15 1.09 1.02 1.06    No results for input(s): DDIMER in the last 72 hours.  Cardiac Enzymes No results for input(s): CKMB, TROPONINI, MYOGLOBIN in the last 168 hours.  Invalid input(s): CK ------------------------------------------------------------------------------------------------------------------ Invalid input(s): POCBNP    Hong Moring D.O. on 01/24/2016 at 10:09 AM  Between 7am to 7pm - Pager - [phone]  After 7pm go to [email or web address] - password [email or web address]  And look for the night coverage person covering for me after hours  [facility] Office  [phone]

## 2016-01-24 NOTE — Brief Op Note (Signed)
01/16/2016 - 01/24/2016  6:03 PM  PATIENT:  Jessica Taylor  75 y.o. female  PRE-OPERATIVE DIAGNOSIS:  CAD  POST-OPERATIVE DIAGNOSIS:  CAD  PROCEDURE:  TRANSESOPHAGEAL ECHOCARDIOGRAM (TEE), MEDIAN STERNOTOMY for CORONARY ARTERY BYPASS GRAFTING (CABG) x 3 (LIMA to LAD, SVG to RAMUS INTERMEDIATE, SVG to PDA) with EVH from RIGHT THIGH and LEFT INTERNAL MAMMARY ARTERY HARVEST  SURGEON:  Surgeon(s) and Role:    * Gaye Pollack, MD - Primary  PHYSICIAN ASSISTANT: Lars Pinks PA-C  ASSISTANTS: Alcide Evener RNFA  ANESTHESIA:   general  EBL:  Total I/O In: -  Out: 500 [Urine:500]  DRAINS: Chest tubes placed in the mediastinal and pleural spaces   COUNTS CORRECT:  YES  DICTATION: .Dragon Dictation  PLAN OF CARE: Admit to inpatient   PATIENT DISPOSITION:  ICU - intubated and hemodynamically stable.   Delay start of Pharmacological VTE agent (>24hrs) due to surgical blood loss or risk of bleeding: yes  BASELINE WEIGHT: 75 kg

## 2016-01-24 NOTE — Op Note (Addendum)
CARDIOVASCULAR SURGERY OPERATIVE NOTE  01/24/2016  Surgeon:  Gaye Pollack, MD  First Assistant: Lars Pinks, PA-C   Preoperative Diagnosis:  Severe multi-vessel coronary artery disease   Postoperative Diagnosis:  Same   Procedure:  1. Median Sternotomy 2. Extracorporeal circulation 3.   Coronary artery bypass grafting x 3   Left internal mammary graft to the LAD  SVG to Ramus  SVG to PDA  4.   Endoscopic vein harvest from the right leg 5.   Insertion of left femoral arterial line for blood pressure monitoring.   Anesthesia:  General Endotracheal   Clinical History/Surgical Indication:  The patient is a 75 year old woman with a history of hypothyroidism, hyperlipidemia, stage III CKD, AAA, PAD s/p bilateral iliac PCI and stenting in 06/2015, and CAD s/p PCI/stenting of the LAD and RCA, PCI of an occluded LCX after MI in 02/2015. She was doing well without any symptoms until about 2/21 when she started having recurrent exertional chest tightness relieved with rest and NTG. She continued having the episodes for several days prior to admission and when she awoke from sleep with chest tightness she thought she better come in for evaluation. She had no ECG changes and negative troponin x 2 on admission. Echo showed normal LVEF of 55-60% with grade 2 diastolic dysfunction, no AS or AI, no MR. Cardiac cath shows severe 3 vessel CAD with a 95% distal RCA lesion at the distal margin of the old stent. The LAD has 90% proximal to mid stenosis between two prior stents. The large Ramus has 65% long in-stent restenosis. She was on Plavix which was stopped after her cath. We checked a P2Y12 level which was normal and therefore I decided to proceed with surgery today. I discussed the operative procedure with the patient and her husband including alternatives, benefits and risks; including but not  limited to bleeding, blood transfusion, infection, stroke, myocardial infarction, graft failure, heart block requiring a permanent pacemaker, organ dysfunction, and death.  [name] understands and agrees to proceed.    Preparation:  The patient was seen in the preoperative holding area and the correct patient, correct operation were confirmed with the patient after reviewing the medical record and catheterization. The consent was signed by me. Preoperative antibiotics were given. A pulmonary arterial line and radial arterial line were placed by the anesthesia team. The patient was taken back to the operating room and positioned supine on the operating room table. After being placed under general endotracheal anesthesia by the anesthesia team a foley catheter was placed. The neck, chest, abdomen, and both legs were prepped with betadine soap and solution and draped in the usual sterile manner. A surgical time-out was taken and the correct patient and operative procedure were confirmed with the nursing and anesthesia staff.  Left femoral arterial line:  Since both of her radial arterial lines failed before we had even positioned her I decided to insert a left femoral arterial line. This was done using Seldinger technique without difficulty.   Cardiopulmonary Bypass:  A median sternotomy was performed. The pericardium was opened in the midline. Right ventricular function appeared normal. The ascending aorta was of normal size and had no palpable plaque. There were no contraindications to aortic cannulation or cross-clamping. The patient was fully systemically heparinized and the ACT was maintained > 400 sec. The proximal aortic arch was cannulated with a 20 F aortic cannula for arterial inflow. Venous cannulation was performed via the right atrial appendage using a  two-staged venous cannula. An antegrade cardioplegia/vent cannula was inserted into the mid-ascending aorta. Aortic occlusion was  performed with a single cross-clamp. Systemic cooling to 32 degrees Centigrade and topical cooling of the heart with iced saline were used. Hyperkalemic antegrade cold blood cardioplegia was used to induce diastolic arrest and was then given at about 20 minute intervals throughout the period of arrest to maintain myocardial temperature at or below 10 degrees centigrade. A temperature probe was inserted into the interventricular septum and an insulating pad was placed in the pericardium.   Left internal mammary harvest:  The left side of the sternum was retracted using the Rultract retractor. The left internal mammary artery was harvested as a pedicle graft. All side branches were clipped. It was a small-sized vessel of good quality with excellent blood flow. It was ligated distally and divided. It was sprayed with topical papaverine solution to prevent vasospasm.   Endoscopic vein harvest:  The right greater saphenous vein was harvested endoscopically through a 2 cm incision medial to the right knee. It was harvested from the upper thigh to below the knee. It was a medium-sized vein of good quality. The side branches were all ligated with 4-0 silk ties.    Coronary arteries:  The coronary arteries were examined.   LAD:  Diffusely diseased but graftable in the mid to distal portion beyond the stented segment.  LCX:  The Ramus was intramyocardial throughout its extent. It was localized just beyond the stented segment within the myocardium where it was a large vessel with mild disease  RCA:  Diffusely diseased out to the proximal PDA. The PDA beyond the ostial/proximal disease was small but graftable.   Grafts:  1. LIMA to the LAD: 1.75 mm. It was sewn end to side using 8-0 prolene continuous suture. 2. SVG to Ramus:  2.0 mm. It was sewn end to side using 7-0 prolene continuous suture. 3. SVG to PDA:  1.6 mm. It was sewn end to side using 7-0 prolene continuous suture.   The proximal  vein graft anastomoses were performed to the mid-ascending aorta using continuous 6-0 prolene suture. Graft markers were placed around the proximal anastomoses.   Completion:  The patient was rewarmed to 37 degrees Centigrade. The clamp was removed from the LIMA pedicle and there was rapid warming of the septum and return of ventricular fibrillation. The crossclamp was removed with a time of 69 minutes. There was spontaneous return of sinus rhythm. The distal and proximal anastomoses were checked for hemostasis. The position of the grafts was satisfactory. Two temporary epicardial pacing wires were placed on the right atrium and two on the right ventricle. The patient was weaned from CPB without difficulty on no inotropes. CPB time was 86 minutes. Cardiac output was 5 LPM. Heparin was fully reversed with protamine and the aortic and venous cannulas removed. Hemostasis was achieved. Mediastinal and left pleural drainage tubes were placed. The sternum was closed with double #6 stainless steel wires. The fascia was closed with continuous # 1 vicryl suture. The subcutaneous tissue was closed with 2-0 vicryl continuous suture. The skin was closed with 3-0 vicryl subcuticular suture. All sponge, needle, and instrument counts were reported correct at the end of the case. Dry sterile dressings were placed over the incisions and around the chest tubes which were connected to pleurevac suction. The patient was then transported to the surgical intensive care unit in critical but stable condition.

## 2016-01-24 NOTE — Anesthesia Preprocedure Evaluation (Addendum)
Anesthesia Evaluation  Patient identified by MRN, date of birth, ID band Patient awake    Reviewed: Allergy & Precautions, NPO status , Patient's Chart, lab work & pertinent test results  Airway Mallampati: II  TM Distance: >3 FB Neck ROM: Full    Dental   Pulmonary former smoker,    breath sounds clear to auscultation       Cardiovascular hypertension, + angina + CAD, + Past MI, + Peripheral Vascular Disease and + DVT   Rhythm:Regular Rate:Normal     Neuro/Psych    GI/Hepatic GERD  ,  Endo/Other  Hypothyroidism   Renal/GU Renal disease     Musculoskeletal  (+) Arthritis ,   Abdominal   Peds  Hematology  (+) anemia ,   Anesthesia Other Findings   Reproductive/Obstetrics                            Anesthesia Physical Anesthesia Plan  ASA: IV  Anesthesia Plan: General   Post-op Pain Management:    Induction: Intravenous  Airway Management Planned: Oral ETT  Additional Equipment: Arterial line, PA Cath, TEE and Ultrasound Guidance Line Placement  Intra-op Plan:   Post-operative Plan: Post-operative intubation/ventilation  Informed Consent: I have reviewed the patients History and Physical, chart, labs and discussed the procedure including the risks, benefits and alternatives for the proposed anesthesia with the patient or authorized representative who has indicated his/her understanding and acceptance.   Dental advisory given  Plan Discussed with: CRNA and Anesthesiologist  Anesthesia Plan Comments:         Anesthesia Quick Evaluation

## 2016-01-24 NOTE — Care Management Note (Signed)
Case Management Note  Patient Details  Name: Jessica Taylor MRN: [id number] Date of Birth: 1941-09-24  Subjective/Objective:  Pt admitted for unstable angina. Pt s/p cath revealed Severe 3 vessel obstructive CAD. Plan for CABG 01-24-16. Pt is from home with family support. No DME at home.                  Action/Plan: CM to continue to monitor for disposition needs.    Expected Discharge Date:                  Expected Discharge Plan:  [place]  In-House Referral:  NA  Discharge planning Services  CM Consult  Post Acute Care Choice:    Choice offered to:     DME Arranged:    DME Agency:     HH Arranged:    HH Agency:     Status of Service:  In process, will continue to follow  Medicare Important Message Given:    Date Medicare IM Given:    Medicare IM give by:    Date Additional Medicare IM Given:    Additional Medicare Important Message give by:     If discussed at [place] of Stay Meetings, dates discussed:    Additional Comments:  Bethena Roys, RN 01/24/2016, 10:45 AM

## 2016-01-24 NOTE — Progress Notes (Signed)
UR Completed Pratyush Ammon Graves-Bigelow, RN,BSN [phone]  

## 2016-01-25 ENCOUNTER — Inpatient Hospital Stay (HOSPITAL_COMMUNITY): Payer: Medicare Other

## 2016-01-25 LAB — POCT I-STAT, CHEM 8
BUN: 16 mg/dL (ref 6–20)
BUN: 15 mg/dL (ref 6–20)
CHLORIDE: 106 mmol/L (ref 101–111)
CHLORIDE: 104 mmol/L (ref 101–111)
CREATININE: 1.1 mg/dL — AB (ref 0.44–1.00)
Calcium, Ion: 1.1 mmol/L — ABNORMAL LOW (ref 1.13–1.30)
Calcium, Ion: 0.92 mmol/L — ABNORMAL LOW (ref 1.13–1.30)
Creatinine, Ser: 1.1 mg/dL — ABNORMAL HIGH (ref 0.44–1.00)
Glucose, Bld: 126 mg/dL — ABNORMAL HIGH (ref 65–99)
Glucose, Bld: 140 mg/dL — ABNORMAL HIGH (ref 65–99)
HEMATOCRIT: 26 % — AB (ref 36.0–46.0)
HEMATOCRIT: 28 % — AB (ref 36.0–46.0)
HEMOGLOBIN: 9.5 g/dL — AB (ref 12.0–15.0)
Hemoglobin: 8.8 g/dL — ABNORMAL LOW (ref 12.0–15.0)
POTASSIUM: 5.4 mmol/L — AB (ref 3.5–5.1)
Potassium: 4.2 mmol/L (ref 3.5–5.1)
SODIUM: 140 mmol/L (ref 135–145)
Sodium: 136 mmol/L (ref 135–145)
TCO2: 23 mmol/L (ref 0–100)
TCO2: 24 mmol/L (ref 0–100)

## 2016-01-25 LAB — GLUCOSE, CAPILLARY
GLUCOSE-CAPILLARY: 123 mg/dL — AB (ref 65–99)
GLUCOSE-CAPILLARY: 111 mg/dL — AB (ref 65–99)
GLUCOSE-CAPILLARY: 121 mg/dL — AB (ref 65–99)
GLUCOSE-CAPILLARY: 139 mg/dL — AB (ref 65–99)
GLUCOSE-CAPILLARY: 146 mg/dL — AB (ref 65–99)
GLUCOSE-CAPILLARY: 113 mg/dL — AB (ref 65–99)
GLUCOSE-CAPILLARY: 125 mg/dL — AB (ref 65–99)
GLUCOSE-CAPILLARY: 126 mg/dL — AB (ref 65–99)
Glucose-Capillary: 110 mg/dL — ABNORMAL HIGH (ref 65–99)
Glucose-Capillary: 139 mg/dL — ABNORMAL HIGH (ref 65–99)
Glucose-Capillary: 145 mg/dL — ABNORMAL HIGH (ref 65–99)
Glucose-Capillary: 127 mg/dL — ABNORMAL HIGH (ref 65–99)
Glucose-Capillary: 135 mg/dL — ABNORMAL HIGH (ref 65–99)
Glucose-Capillary: 116 mg/dL — ABNORMAL HIGH (ref 65–99)
Glucose-Capillary: 118 mg/dL — ABNORMAL HIGH (ref 65–99)
Glucose-Capillary: 138 mg/dL — ABNORMAL HIGH (ref 65–99)
Glucose-Capillary: 116 mg/dL — ABNORMAL HIGH (ref 65–99)

## 2016-01-25 LAB — CREATININE, SERUM
Creatinine, Ser: 1.19 mg/dL — ABNORMAL HIGH (ref 0.44–1.00)
GFR calc non Af Amer: 43 mL/min — ABNORMAL LOW (ref >60)
GFR, EST AFRICAN AMERICAN: 50 mL/min — AB (ref >60)

## 2016-01-25 LAB — POCT I-STAT 3, ART BLOOD GAS (G3+)
Acid-base deficit: 3 mmol/L — ABNORMAL HIGH (ref 0.0–2.0)
Acid-base deficit: 4 mmol/L — ABNORMAL HIGH (ref 0.0–2.0)
BICARBONATE: 22.3 meq/L (ref 20.0–24.0)
Bicarbonate: 22.2 mEq/L (ref 20.0–24.0)
O2 Saturation: 96 %
O2 Saturation: 95 %
PCO2 ART: 41.4 mmHg (ref 35.0–45.0)
PH ART: 7.339 — AB (ref 7.350–7.450)
PO2 ART: 88 mmHg (ref 80.0–100.0)
TCO2: 24 mmol/L (ref 0–100)
TCO2: 23 mmol/L (ref 0–100)
pCO2 arterial: 41 mmHg (ref 35.0–45.0)
pH, Arterial: 7.334 — ABNORMAL LOW (ref 7.350–7.450)
pO2, Arterial: 79 mmHg — ABNORMAL LOW (ref 80.0–100.0)

## 2016-01-25 LAB — CBC
HEMATOCRIT: 27.2 % — AB (ref 36.0–46.0)
HEMATOCRIT: 29 % — AB (ref 36.0–46.0)
HEMOGLOBIN: 8.4 g/dL — AB (ref 12.0–15.0)
HEMOGLOBIN: 8.9 g/dL — AB (ref 12.0–15.0)
MCH: 27 pg (ref 26.0–34.0)
MCH: 27.3 pg (ref 26.0–34.0)
MCHC: 30.9 g/dL (ref 30.0–36.0)
MCHC: 30.7 g/dL (ref 30.0–36.0)
MCV: 87.5 fL (ref 78.0–100.0)
MCV: 89 fL (ref 78.0–100.0)
Platelets: 99 10*3/uL — ABNORMAL LOW (ref 150–400)
Platelets: 140 10*3/uL — ABNORMAL LOW (ref 150–400)
RBC: 3.11 MIL/uL — ABNORMAL LOW (ref 3.87–5.11)
RBC: 3.26 MIL/uL — AB (ref 3.87–5.11)
RDW: 15.7 % — ABNORMAL HIGH (ref 11.5–15.5)
RDW: 16.1 % — ABNORMAL HIGH (ref 11.5–15.5)
WBC: 7.1 10*3/uL (ref 4.0–10.5)
WBC: 9.2 10*3/uL (ref 4.0–10.5)

## 2016-01-25 LAB — BASIC METABOLIC PANEL
ANION GAP: 6 (ref 5–15)
BUN: 15 mg/dL (ref 6–20)
CHLORIDE: 111 mmol/L (ref 101–111)
CO2: 24 mmol/L (ref 22–32)
Calcium: 7.8 mg/dL — ABNORMAL LOW (ref 8.9–10.3)
Creatinine, Ser: 1.09 mg/dL — ABNORMAL HIGH (ref 0.44–1.00)
GFR calc non Af Amer: 48 mL/min — ABNORMAL LOW (ref >60)
GFR, EST AFRICAN AMERICAN: 56 mL/min — AB (ref >60)
Glucose, Bld: 130 mg/dL — ABNORMAL HIGH (ref 65–99)
POTASSIUM: 4.4 mmol/L (ref 3.5–5.1)
Sodium: 141 mmol/L (ref 135–145)

## 2016-01-25 LAB — MAGNESIUM
Magnesium: 3.9 mg/dL — ABNORMAL HIGH (ref 1.7–2.4)
Magnesium: 3.1 mg/dL — ABNORMAL HIGH (ref 1.7–2.4)

## 2016-01-25 MED ORDER — INSULIN DETEMIR 100 UNIT/ML ~~LOC~~ SOLN
10.0000 [IU] | Freq: Every day | SUBCUTANEOUS | Status: DC
Start: 2016-01-26 — End: 2016-01-27
  Administered 2016-01-26: 10 [IU] via SUBCUTANEOUS
  Filled 2016-01-25 (×2): qty 0.1

## 2016-01-25 MED ORDER — INSULIN DETEMIR 100 UNIT/ML ~~LOC~~ SOLN
10.0000 [IU] | Freq: Once | SUBCUTANEOUS | Status: AC
  Administered 2016-01-25: 10 [IU] via SUBCUTANEOUS
  Filled 2016-01-25: qty 0.1

## 2016-01-25 MED ORDER — METOCLOPRAMIDE HCL 5 MG/ML IJ SOLN
5.0000 mg | Freq: Four times a day (QID) | INTRAMUSCULAR | Status: DC
  Administered 2016-01-25 – 2016-01-26 (×3): 5 mg via INTRAVENOUS
  Filled 2016-01-25 (×4): qty 2

## 2016-01-25 MED ORDER — ENOXAPARIN SODIUM 40 MG/0.4ML ~~LOC~~ SOLN
40.0000 mg | Freq: Every day | SUBCUTANEOUS | Status: DC
  Administered 2016-01-25 – 2016-01-30 (×6): 40 mg via SUBCUTANEOUS
  Filled 2016-01-25 (×6): qty 0.4

## 2016-01-25 MED ORDER — INSULIN ASPART 100 UNIT/ML ~~LOC~~ SOLN
0.0000 [IU] | SUBCUTANEOUS | Status: DC
  Administered 2016-01-25 – 2016-01-26 (×3): 2 [IU] via SUBCUTANEOUS

## 2016-01-25 NOTE — Procedures (Signed)
Extubation Procedure Note  Patient Details:   Name: Jessica Taylor DOB: 07-25-1941 MRN: [id number]   Airway Documentation:  Pre-extubation: Rapid Wean completed. ABG normal. NIF-30, VC 820. Cuff leak present. Follows all commands.  Post-extubation: 4L [place], No Stridor, Name/location spoken. Clear BBS.     Evaluation  O2 sats: stable throughout Complications: No apparent complications Patient did tolerate procedure well. Bilateral Breath Sounds: Clear   Yes  Sharen Hint 01/25/2016, 4:34 AM

## 2016-01-25 NOTE — Progress Notes (Signed)
Patient ID: Jessica Taylor, female   DOB: 23-Jun-1941, 75 y.o.   MRN: [id number]   SICU Evening Rounds:   Hemodynamically stable   Some nausea today  Urine output good    Pm labs pending  A/P:  Stable postop course. Continue current plans. Will add some Reglan.

## 2016-01-25 NOTE — Progress Notes (Signed)
Started rapid wean protocol at 03:19. Was delayed d/t pt's drowsiness.   Henreitta Leber, RN 3:25 AM 01/25/2016

## 2016-01-25 NOTE — Progress Notes (Signed)
RT NOTE:  Rapid Wean started 

## 2016-01-25 NOTE — Progress Notes (Signed)
Raised HOB to 20 degrees and reverse trendelenburg so the pt was at 30 degrees. Pt had vagal response, became acutely hypotensive with N/V. Gave PRN IV Zofran, nausea decreased. Controlling BP with Neo and 552mL LR bolus (for low BP and CI <1.8). BP back WDL now. Will not dangle this AM d/t instability and pt has femoral a-line.  Will continue to monitor pt condition.  Henreitta Leber, RN 6:45 AM 01/25/2016

## 2016-01-25 NOTE — Plan of Care (Signed)
[facility] PROGRESS NOTE  Patient: Jessica Taylor [id number]   PCP: [name], MD DOB: October 10, 1941   DOA: 01/16/2016   DOS: 01/25/2016   Pt is on cardiothoracic surgery in ICU. Appreciate excellent care provided. Will be signing off and available as needed once the patient is out of ICU. Thank you.  Author: Berle Mull, MD [facility] Pager: [phone] 01/25/2016 8:27 AM   If 7PM-7AM, please contact night-coverage at [email or web address], password [email or web address]

## 2016-01-25 NOTE — Progress Notes (Signed)
Pt now moves all extremities on command.  Henreitta Leber, RN 12:29 AM 01/25/2016

## 2016-01-25 NOTE — Progress Notes (Signed)
1 Day Post-Op Procedure(s) (LRB): CORONARY ARTERY BYPASS GRAFTING (CABG) (N/A) TRANSESOPHAGEAL ECHOCARDIOGRAM (TEE) (N/A) Subjective:  No complaints  Objective: Vital signs in last 24 hours: Temp:  [95.5 F (35.3 C)-99 F (37.2 C)] 97.5 F (36.4 C) (03/04 0930) Pulse Rate:  [45-90] 88 (03/04 0930) Cardiac Rhythm:  [-] A-V Sequential paced (03/04 0800) Resp:  [11-24] 14 (03/04 0930) BP: (76-134)/(43-85) 92/58 mmHg (03/04 0915) SpO2:  [91 %-100 %] 99 % (03/04 0930) Arterial Line BP: (84-149)/(38-80) 112/52 mmHg (03/04 0930) FiO2 (%):  [40 %-50 %] 40 % (03/04 0400) Weight:  [80.3 kg (177 lb 0.5 oz)] 80.3 kg (177 lb 0.5 oz) (03/04 0100)  Hemodynamic parameters for last 24 hours: PAP: (15-35)/(7-21) 26/12 mmHg CO:  [2.5 L/min-4 L/min] 3.8 L/min CI:  [1.4 L/min/m2-2.2 L/min/m2] 2.1 L/min/m2  Intake/Output from previous day: 03/03 0701 - 03/04 0700 In: 5864.1 [I.V.:4199.1; Blood:255; IV Piggyback:1400] Out: [id number] [Urine:3370; Blood:500; Chest Tube:470] Intake/Output this shift: Total I/O In: 150.9 [I.V.:150.9] Out: 155 [Urine:105; Chest Tube:50]  General appearance: alert and cooperative Neurologic: intact Heart: regular rate and rhythm, S1, S2 normal, no murmur, click, rub or gallop Lungs: clear to auscultation bilaterally Extremities: edema mild Wound: dressings dry  Lab Results:  Recent Labs  01/24/16 1945  01/25/16 0200 01/25/16 0208  WBC 9.4  --  7.1  --   HGB 10.3*  < > 8.4* 8.8*  HCT 33.0*  < > 27.2* 26.0*  PLT 106*  --  99*  --   < > = values in this interval not displayed. BMET:  Recent Labs  01/24/16 0415  01/25/16 0200 01/25/16 0208  NA 139  < > 141 140  K 4.8  < > 4.4 4.2  CL 105  < > 111 106  CO2 19*  --  24  --   GLUCOSE 105*  < > 130* 126*  BUN 27*  < > 15 16  CREATININE 1.30*  < > 1.09* 1.10*  CALCIUM 9.7  --  7.8*  --   < > = values in this interval not displayed.  PT/INR:  Recent Labs  01/24/16 1945  LABPROT 17.7*  INR 1.45    ABG    Component Value Date/Time   PHART 7.334* 01/25/2016 0533   HCO3 22.2 01/25/2016 0533   TCO2 23 01/25/2016 0533   ACIDBASEDEF 4.0* 01/25/2016 0533   O2SAT 95.0 01/25/2016 0533   CBG (last 3)   Recent Labs  01/25/16 0654 01/25/16 0806 01/25/16 0910  GLUCAP 135* 113* 127*   CXR: ok  ECG: sinus, nonspecific changes  Assessment/Plan: S/P Procedure(s) (LRB): CORONARY ARTERY BYPASS GRAFTING (CABG) (N/A) TRANSESOPHAGEAL ECHOCARDIOGRAM (TEE) (N/A)  Hemodynamically stable on low dose neo. Hold beta blocker and wean neo as tolerated. DC femoral arterial line Mobilize later today Diuresis once stable off neo Diabetes control: preop Hgb A1c 5.8 d/c tubes/lines Continue foley due to patient in ICU and urinary output monitoring See progression orders   LOS: 4 days    Jessica Taylor 01/25/2016

## 2016-01-26 ENCOUNTER — Inpatient Hospital Stay (HOSPITAL_COMMUNITY): Payer: Medicare Other

## 2016-01-26 LAB — BASIC METABOLIC PANEL
ANION GAP: 10 (ref 5–15)
BUN: 19 mg/dL (ref 6–20)
CALCIUM: 8.6 mg/dL — AB (ref 8.9–10.3)
CO2: 23 mmol/L (ref 22–32)
Chloride: 102 mmol/L (ref 101–111)
Creatinine, Ser: 1.46 mg/dL — ABNORMAL HIGH (ref 0.44–1.00)
GFR calc Af Amer: 39 mL/min — ABNORMAL LOW (ref >60)
GFR, EST NON AFRICAN AMERICAN: 34 mL/min — AB (ref >60)
Glucose, Bld: 152 mg/dL — ABNORMAL HIGH (ref 65–99)
Potassium: 4.4 mmol/L (ref 3.5–5.1)
Sodium: 135 mmol/L (ref 135–145)

## 2016-01-26 LAB — GLUCOSE, CAPILLARY
GLUCOSE-CAPILLARY: 100 mg/dL — AB (ref 65–99)
GLUCOSE-CAPILLARY: 105 mg/dL — AB (ref 65–99)
GLUCOSE-CAPILLARY: 129 mg/dL — AB (ref 65–99)
GLUCOSE-CAPILLARY: 137 mg/dL — AB (ref 65–99)
Glucose-Capillary: 131 mg/dL — ABNORMAL HIGH (ref 65–99)
Glucose-Capillary: 111 mg/dL — ABNORMAL HIGH (ref 65–99)

## 2016-01-26 LAB — CBC
HCT: 29.5 % — ABNORMAL LOW (ref 36.0–46.0)
HEMOGLOBIN: 8.9 g/dL — AB (ref 12.0–15.0)
MCH: 26.8 pg (ref 26.0–34.0)
MCHC: 30.2 g/dL (ref 30.0–36.0)
MCV: 88.9 fL (ref 78.0–100.0)
Platelets: 145 10*3/uL — ABNORMAL LOW (ref 150–400)
RBC: 3.32 MIL/uL — AB (ref 3.87–5.11)
RDW: 16.2 % — ABNORMAL HIGH (ref 11.5–15.5)
WBC: 12.1 10*3/uL — AB (ref 4.0–10.5)

## 2016-01-26 MED ORDER — DOPAMINE-DEXTROSE 3.2-5 MG/ML-% IV SOLN
3.0000 ug/kg/min | INTRAVENOUS | Status: DC
  Administered 2016-01-26 – 2016-01-28 (×2): 3 ug/kg/min via INTRAVENOUS
  Filled 2016-01-26 (×2): qty 250

## 2016-01-26 NOTE — Progress Notes (Signed)
2 Days Post-Op Procedure(s) (LRB): CORONARY ARTERY BYPASS GRAFTING (CABG) (N/A) TRANSESOPHAGEAL ECHOCARDIOGRAM (TEE) (N/A) Subjective:  Some dizziness and nausea. She says she has had that before with Percocet  She ambulated this am.  Objective: Vital signs in last 24 hours: Temp:  [97.6 F (36.4 C)-99.3 F (37.4 C)] 98.3 F (36.8 C) (03/05 0721) Pulse Rate:  [58-83] 63 (03/05 0930) Cardiac Rhythm:  [-] Heart block;Normal sinus rhythm;Sinus bradycardia (03/05 0400) Resp:  [12-25] 15 (03/05 0930) BP: (73-124)/(41-62) 102/45 mmHg (03/05 0930) SpO2:  [91 %-99 %] 91 % (03/05 0930) Weight:  [80.5 kg (177 lb 7.5 oz)] 80.5 kg (177 lb 7.5 oz) (03/05 0500)  Hemodynamic parameters for last 24 hours:    Intake/Output from previous day: 03/04 0701 - 03/05 0700 In: 1277.6 [P.O.:150; I.V.:1077.6; IV Piggyback:50] Out: 915 [Urine:755; Chest Tube:160] Intake/Output this shift: Total I/O In: 101.6 [I.V.:101.6] Out: 335 [Urine:335]  General appearance: alert and cooperative, looks weak Neurologic: intact Heart: regular rate and rhythm, S1, S2 normal, no murmur, click, rub or gallop Lungs: diminished breath sounds bibasilar Extremities: edema mild Wound: incisions ok  Lab Results:  Recent Labs  01/25/16 1630 01/25/16 1640 01/26/16 0330  WBC 9.2  --  12.1*  HGB 8.9* 9.5* 8.9*  HCT 29.0* 28.0* 29.5*  PLT 140*  --  145*   BMET:  Recent Labs  01/25/16 0200  01/25/16 1640 01/26/16 0330  NA 141  < > 136 135  K 4.4  < > 5.4* 4.4  CL 111  < > 104 102  CO2 24  --   --  23  GLUCOSE 130*  < > 140* 152*  BUN 15  < > 15 19  CREATININE 1.09*  < > 1.10* 1.46*  CALCIUM 7.8*  --   --  8.6*  < > = values in this interval not displayed.  PT/INR:  Recent Labs  01/24/16 1945  LABPROT 17.7*  INR 1.45   ABG    Component Value Date/Time   PHART 7.334* 01/25/2016 0533   HCO3 22.2 01/25/2016 0533   TCO2 24 01/25/2016 1640   ACIDBASEDEF 4.0* 01/25/2016 0533   O2SAT 95.0 01/25/2016  0533   CBG (last 3)   Recent Labs  01/26/16 0050 01/26/16 0331 01/26/16 0719  GLUCAP 129* 137* 131*    Assessment/Plan: S/P Procedure(s) (LRB): CORONARY ARTERY BYPASS GRAFTING (CABG) (N/A) TRANSESOPHAGEAL ECHOCARDIOGRAM (TEE) (N/A)  She is hemodynamically stable but still on neo. I started some low dose dopamine early this am since her urine output was low and BP marginal. HR is 66 sinus but would not pace this am. No beta blocker.  Creat up slightly but urine output good since dopamine started. Keep foley in today to monitor urine output.  Glucose under adequate control.  Continue mobilization and IS.     LOS: 5 days    Jessica Taylor 01/26/2016

## 2016-01-26 NOTE — Progress Notes (Signed)
Dr. Cyndia Bent paged and spoke with regarding low urine output over the past two hours. Orders rececived for renal dose dopamine. Will continue to monitor closely. Eleonore Chiquito RN 2 [place]

## 2016-01-27 ENCOUNTER — Encounter (HOSPITAL_COMMUNITY): Payer: Self-pay | Admitting: Surgery

## 2016-01-27 LAB — GLUCOSE, CAPILLARY
GLUCOSE-CAPILLARY: 112 mg/dL — AB (ref 65–99)
GLUCOSE-CAPILLARY: 72 mg/dL (ref 65–99)
GLUCOSE-CAPILLARY: 74 mg/dL (ref 65–99)
GLUCOSE-CAPILLARY: 76 mg/dL (ref 65–99)
GLUCOSE-CAPILLARY: 81 mg/dL (ref 65–99)
GLUCOSE-CAPILLARY: 96 mg/dL (ref 65–99)

## 2016-01-27 LAB — CBC
HCT: 23.8 % — ABNORMAL LOW (ref 36.0–46.0)
HEMOGLOBIN: 7.6 g/dL — AB (ref 12.0–15.0)
MCH: 28.1 pg (ref 26.0–34.0)
MCHC: 31.9 g/dL (ref 30.0–36.0)
MCV: 88.1 fL (ref 78.0–100.0)
Platelets: 108 10*3/uL — ABNORMAL LOW (ref 150–400)
RBC: 2.7 MIL/uL — AB (ref 3.87–5.11)
RDW: 16.4 % — ABNORMAL HIGH (ref 11.5–15.5)
WBC: 7.4 10*3/uL (ref 4.0–10.5)

## 2016-01-27 LAB — TYPE AND SCREEN
ABO/RH(D): O POS
ANTIBODY SCREEN: NEGATIVE
UNIT DIVISION: 0
UNIT DIVISION: 0
Unit division: 0
Unit division: 0

## 2016-01-27 LAB — BASIC METABOLIC PANEL
Anion gap: 7 (ref 5–15)
BUN: 19 mg/dL (ref 6–20)
CHLORIDE: 106 mmol/L (ref 101–111)
CO2: 26 mmol/L (ref 22–32)
Calcium: 8.4 mg/dL — ABNORMAL LOW (ref 8.9–10.3)
Creatinine, Ser: 1.47 mg/dL — ABNORMAL HIGH (ref 0.44–1.00)
GFR calc non Af Amer: 33 mL/min — ABNORMAL LOW (ref >60)
GFR, EST AFRICAN AMERICAN: 39 mL/min — AB (ref >60)
Glucose, Bld: 110 mg/dL — ABNORMAL HIGH (ref 65–99)
POTASSIUM: 4.2 mmol/L (ref 3.5–5.1)
SODIUM: 139 mmol/L (ref 135–145)

## 2016-01-27 LAB — PREPARE RBC (CROSSMATCH)

## 2016-01-27 MED ORDER — SODIUM CHLORIDE 0.9 % IV SOLN: Freq: Once | INTRAVENOUS | Status: AC

## 2016-01-27 NOTE — Progress Notes (Addendum)
Patient ID: ACEY DOBBERSTEIN, female   DOB: 02/21/1941, 75 y.o.   MRN: [id number] EVENING ROUNDS NOTE :     [address]       [place] [address]             [phone]                 3 Days Post-Op Procedure(s) (LRB): CORONARY ARTERY BYPASS GRAFTING (CABG) (N/A) TRANSESOPHAGEAL ECHOCARDIOGRAM (TEE) (N/A)  Total Length of Stay:  LOS: 6 days  BP 114/51 mmHg  Pulse 81  Temp(Src) 98.2 F (36.8 C) (Oral)  Resp 15  Ht 5\' 3"  (1.6 m)  Wt 176 lb 9.6 oz (80.105 kg)  BMI 31.29 kg/m2  SpO2 95%  .Intake/Output      03/06 0701 - 03/07 0700   P.O. 120   I.V. (mL/kg) 294 (3.7)   Blood 335   IV Piggyback    Total Intake(mL/kg) 749 (9.4)   Urine (mL/kg/hr) 1110 (1)   Total Output 1110   Net -361         . sodium chloride    . DOPamine 3 mcg/kg/min (01/27/16 1700)  . lactated ringers 20 mL/hr at 01/26/16 1027  . phenylephrine (NEO-SYNEPHRINE) Adult infusion Stopped (01/26/16 1330)     Lab Results  Component Value Date   WBC 7.4 01/27/2016   HGB 7.6* 01/27/2016   HCT 23.8* 01/27/2016   PLT 108* 01/27/2016   GLUCOSE 110* 01/27/2016   CHOL 177 01/17/2016   TRIG 156* 01/17/2016   HDL 36* 01/17/2016   LDLCALC 110* 01/17/2016   ALT 19 03/06/2015   AST 20 03/06/2015   NA 139 01/27/2016   K 4.2 01/27/2016   CL 106 01/27/2016   CREATININE 1.47* 01/27/2016   BUN 19 01/27/2016   CO2 26 01/27/2016   TSH 0.844 03/06/2015   INR 1.45 01/24/2016   HGBA1C 5.8* 01/23/2016   Chronic Kidney Disease   Stage I     GFR >90  Stage II    GFR 60-89  Stage IIIA GFR 45-59  Stage IIIB GFR 30-44  Stage IV   GFR 15-29  Stage V    GFR  <15  Lab Results  Component Value Date   CREATININE 1.47* 01/27/2016   Estimated Creatinine Clearance: 32.6 mL/min (by C-G formula based on Cr of 1.47).  Complaint of nausea , walked short distance  Grace Isaac MD  [facility] [phone] Office [phone] 01/27/2016 8:40 PM

## 2016-01-27 NOTE — Care Management Note (Signed)
Case Management Note  Patient Details  Name: Jessica Taylor MRN: [id number] Date of Birth: [birth year]  Subjective/Objective:     Pt lives with spouse who will be available for 24/7 assistance when she is medically stable for discharge.                       Expected Discharge Plan:  [place]  Discharge planning Services  CM Consult  Status of Service:  In process, will continue to follow  Medicare Important Message Given:  Yes  Girard Cooter, RN 01/27/2016, 1:55 PM

## 2016-01-27 NOTE — Progress Notes (Signed)
3 Days Post-Op Procedure(s) (LRB): CORONARY ARTERY BYPASS GRAFTING (CABG) (N/A) TRANSESOPHAGEAL ECHOCARDIOGRAM (TEE) (N/A) Subjective:  Feels better today  Objective: Vital signs in last 24 hours: Temp:  [98.2 F (36.8 C)-98.5 F (36.9 C)] 98.2 F (36.8 C) (03/06 1225) Pulse Rate:  [71-88] 77 (03/06 1600) Cardiac Rhythm:  [-] Normal sinus rhythm;Bundle branch block (03/06 0745) Resp:  [12-25] 14 (03/06 1600) BP: (85-123)/(41-67) 121/51 mmHg (03/06 1700) SpO2:  [88 %-100 %] 94 % (03/06 1600) Weight:  [80.105 kg (176 lb 9.6 oz)] 80.105 kg (176 lb 9.6 oz) (03/06 0756)  Hemodynamic parameters for last 24 hours:    Intake/Output from previous day: 03/05 0701 - 03/06 0700 In: 843.3 [P.O.:100; I.V.:693.3; IV Piggyback:50] Out: [id number] Intake/Output this shift: Total I/O In: 700 [P.O.:120; I.V.:245; Blood:335] Out: 1040 [id number]  General appearance: alert and cooperative Neurologic: intact Heart: regular rate and rhythm, S1, S2 normal, no murmur, click, rub or gallop Lungs: diminished breath sounds bibasilar Abdomen: soft, non-tender; bowel sounds normal; no masses,  no organomegaly Extremities: edema moderate Wound: incisions ok  Lab Results:  Recent Labs  01/26/16 0330 01/27/16 0430  WBC 12.1* 7.4  HGB 8.9* 7.6*  HCT 29.5* 23.8*  PLT 145* 108*   BMET:  Recent Labs  01/26/16 0330 01/27/16 0430  NA 135 139  K 4.4 4.2  CL 102 106  CO2 23 26  GLUCOSE 152* 110*  BUN 19 19  CREATININE 1.46* 1.47*  CALCIUM 8.6* 8.4*    PT/INR:  Recent Labs  01/24/16 1945  LABPROT 17.7*  INR 1.45   ABG    Component Value Date/Time   PHART 7.334* 01/25/2016 0533   HCO3 22.2 01/25/2016 0533   TCO2 24 01/25/2016 1640   ACIDBASEDEF 4.0* 01/25/2016 0533   O2SAT 95.0 01/25/2016 0533   CBG (last 3)   Recent Labs  01/27/16 0859 01/27/16 1210 01/27/16 1635  GLUCAP 72 76 74    Assessment/Plan: S/P Procedure(s) (LRB): CORONARY ARTERY BYPASS GRAFTING  (CABG) (N/A) TRANSESOPHAGEAL ECHOCARDIOGRAM (TEE) (N/A)  She was put on low dose dopamine overnight for borderline BP and oliguria. Her BP is better and urine output is good. Creat was stable from yesterday am to today am.   Acute postop blood loss anemia: Hgb down to 7.6 from 8.9 yesterday. No visible blood loss. Will transfuse 1 unit PRBC's today.  Continue mobilization and IS.   LOS: 6 days    Gaye Pollack 01/27/2016

## 2016-01-27 NOTE — Significant Event (Signed)
Patient refuses ambulation this evening. Educated on the importance of mobility. Patient continues to refuse. Family at bedside. Ahmya Bernick, Therapist, sports.

## 2016-01-28 LAB — GLUCOSE, CAPILLARY
GLUCOSE-CAPILLARY: 75 mg/dL (ref 65–99)
GLUCOSE-CAPILLARY: 79 mg/dL (ref 65–99)
GLUCOSE-CAPILLARY: 79 mg/dL (ref 65–99)
GLUCOSE-CAPILLARY: 85 mg/dL (ref 65–99)
GLUCOSE-CAPILLARY: 90 mg/dL (ref 65–99)
Glucose-Capillary: 91 mg/dL (ref 65–99)

## 2016-01-28 LAB — CBC
HEMATOCRIT: 28.8 % — AB (ref 36.0–46.0)
HEMOGLOBIN: 8.9 g/dL — AB (ref 12.0–15.0)
MCH: 26.9 pg (ref 26.0–34.0)
MCHC: 30.9 g/dL (ref 30.0–36.0)
MCV: 87 fL (ref 78.0–100.0)
Platelets: 125 10*3/uL — ABNORMAL LOW (ref 150–400)
RBC: 3.31 MIL/uL — AB (ref 3.87–5.11)
RDW: 18 % — ABNORMAL HIGH (ref 11.5–15.5)
WBC: 6.5 10*3/uL (ref 4.0–10.5)

## 2016-01-28 LAB — TYPE AND SCREEN
ABO/RH(D): O POS
ANTIBODY SCREEN: NEGATIVE
Unit division: 0

## 2016-01-28 LAB — BASIC METABOLIC PANEL
ANION GAP: 10 (ref 5–15)
BUN: 19 mg/dL (ref 6–20)
CALCIUM: 8.7 mg/dL — AB (ref 8.9–10.3)
CHLORIDE: 105 mmol/L (ref 101–111)
CO2: 25 mmol/L (ref 22–32)
Creatinine, Ser: 1.17 mg/dL — ABNORMAL HIGH (ref 0.44–1.00)
GFR calc non Af Amer: 44 mL/min — ABNORMAL LOW (ref >60)
GFR, EST AFRICAN AMERICAN: 51 mL/min — AB (ref >60)
GLUCOSE: 98 mg/dL (ref 65–99)
Potassium: 4.4 mmol/L (ref 3.5–5.1)
Sodium: 140 mmol/L (ref 135–145)

## 2016-01-28 MED ORDER — FAMOTIDINE 20 MG PO TABS
20.0000 mg | ORAL_TABLET | Freq: Two times a day (BID) | ORAL | Status: DC
Start: 2016-01-28 — End: 2016-01-31
  Administered 2016-01-28 – 2016-01-31 (×7): 20 mg via ORAL
  Filled 2016-01-28 (×7): qty 1

## 2016-01-28 MED ORDER — METOCLOPRAMIDE HCL 5 MG PO TABS
5.0000 mg | ORAL_TABLET | Freq: Three times a day (TID) | ORAL | Status: DC
  Administered 2016-01-28 (×3): 5 mg via ORAL
  Filled 2016-01-28 (×4): qty 1

## 2016-01-28 MED FILL — Sodium Bicarbonate IV Soln 8.4%: INTRAVENOUS | Qty: 50 | Status: AC

## 2016-01-28 MED FILL — Mannitol IV Soln 20%: INTRAVENOUS | Qty: 500 | Status: AC

## 2016-01-28 MED FILL — Dextrose Inj 5%: INTRAVENOUS | Qty: 250 | Status: AC

## 2016-01-28 MED FILL — Lidocaine HCl IV Inj 20 MG/ML: INTRAVENOUS | Qty: 5 | Status: AC

## 2016-01-28 MED FILL — Sodium Chloride IV Soln 0.9%: INTRAVENOUS | Qty: 2000 | Status: AC

## 2016-01-28 MED FILL — Magnesium Sulfate Inj 50%: INTRAMUSCULAR | Qty: 10 | Status: AC

## 2016-01-28 MED FILL — Heparin Sodium (Porcine) Inj 1000 Unit/ML: INTRAMUSCULAR | Qty: 30 | Status: AC

## 2016-01-28 MED FILL — Potassium Chloride Inj 2 mEq/ML: INTRAVENOUS | Qty: 40 | Status: AC

## 2016-01-28 MED FILL — Phenylephrine HCl Inj 10 MG/ML: INTRAMUSCULAR | Qty: 2 | Status: AC

## 2016-01-28 MED FILL — Heparin Sodium (Porcine) Inj 1000 Unit/ML: INTRAMUSCULAR | Qty: 10 | Status: AC

## 2016-01-28 MED FILL — Electrolyte-R (PH 7.4) Solution: INTRAVENOUS | Qty: 5000 | Status: AC

## 2016-01-28 NOTE — Progress Notes (Signed)
4 Days Post-Op Procedure(s) (LRB): CORONARY ARTERY BYPASS GRAFTING (CABG) (N/A) TRANSESOPHAGEAL ECHOCARDIOGRAM (TEE) (N/A) Subjective:  Some nausea overnight but none now after some medication  Objective: Vital signs in last 24 hours: Temp:  [97.5 F (36.4 C)-98.4 F (36.9 C)] 98.4 F (36.9 C) (03/07 0726) Pulse Rate:  [75-88] 83 (03/07 0700) Cardiac Rhythm:  [-] Normal sinus rhythm;Bundle branch block (03/07 0400) Resp:  [10-24] 11 (03/07 0700) BP: (98-144)/(44-64) 113/57 mmHg (03/07 0700) SpO2:  [92 %-100 %] 95 % (03/07 0700) Weight:  [79.6 kg (175 lb 7.8 oz)] 79.6 kg (175 lb 7.8 oz) (03/07 0500)  Hemodynamic parameters for last 24 hours:    Intake/Output from previous day: 03/06 0701 - 03/07 0700 In: 1018.6 [P.O.:120; I.V.:563.6; Blood:335] Out: 1370 [Urine:1370] Intake/Output this shift:    General appearance: alert and cooperative Neurologic: intact Heart: regular rate and rhythm, S1, S2 normal, no murmur, click, rub or gallop Lungs: diminished breath sounds bibasilar Abdomen: soft, non-tender; bowel sounds normal; no masses,  no organomegaly Extremities: edema mild Wound: incisions ok  Lab Results:  Recent Labs  01/27/16 0430 01/28/16 0508  WBC 7.4 6.5  HGB 7.6* 8.9*  HCT 23.8* 28.8*  PLT 108* 125*   BMET:  Recent Labs  01/27/16 0430 01/28/16 0508  NA 139 140  K 4.2 4.4  CL 106 105  CO2 26 25  GLUCOSE 110* 98  BUN 19 19  CREATININE 1.47* 1.17*  CALCIUM 8.4* 8.7*    PT/INR: No results for input(s): LABPROT, INR in the last 72 hours. ABG    Component Value Date/Time   PHART 7.334* 01/25/2016 0533   HCO3 22.2 01/25/2016 0533   TCO2 24 01/25/2016 1640   ACIDBASEDEF 4.0* 01/25/2016 0533   O2SAT 95.0 01/25/2016 0533   CBG (last 3)   Recent Labs  01/27/16 2016 01/28/16 0017 01/28/16 0444  GLUCAP 81 90 75    Assessment/Plan: S/P Procedure(s) (LRB): CORONARY ARTERY BYPASS GRAFTING (CABG) (N/A) TRANSESOPHAGEAL ECHOCARDIOGRAM (TEE)  (N/A)  Hemodynamically stable in sinus rhythm.  Creat back to baseline. Wean off dopamine and remove sleeve. Keep foley in today to monitor urine output.  Volume excess: weight 10 lbs over preop. Wait to diurese until stable off dopamine  Postop anemia improved after transfusion yesterday.  Nausea: will start po Reglan. No BM yet.  Continue mobilization.   LOS: 7 days    Gaye Pollack 01/28/2016

## 2016-01-28 NOTE — Anesthesia Postprocedure Evaluation (Addendum)
Anesthesia Post Note  Patient: Jessica Taylor  Procedure(s) Performed: Procedure(s) (LRB): CORONARY ARTERY BYPASS GRAFTING (CABG) (N/A) TRANSESOPHAGEAL ECHOCARDIOGRAM (TEE) (N/A)  Patient location during evaluation: SICU Level of consciousness: sedated and patient remains intubated per anesthesia plan Pain management: pain level controlled Vital Signs Assessment: post-procedure vital signs reviewed and stable Respiratory status: patient remains intubated per anesthesia plan Cardiovascular status: stable Anesthetic complications: no    Last Vitals:  Filed Vitals:   01/28/16 0726 01/28/16 0800  BP:  113/54  Pulse:  82  Temp: 36.9 C   Resp:  14    Last Pain:  Filed Vitals:   01/28/16 0811  PainSc: 0-No pain                 Berlyn Malina,JAMES TERRILL

## 2016-01-28 NOTE — Anesthesia Postprocedure Evaluation (Signed)
Anesthesia Post Note  Patient: Jessica Taylor  Procedure(s) Performed: Procedure(s) (LRB): CORONARY ARTERY BYPASS GRAFTING (CABG) (N/A) TRANSESOPHAGEAL ECHOCARDIOGRAM (TEE) (N/A)  Anesthesia Post Evaluation  Last Vitals:  Filed Vitals:   01/28/16 0726 01/28/16 0800  BP:  113/54  Pulse:  82  Temp: 36.9 C   Resp:  14    Last Pain:  Filed Vitals:   01/28/16 0811  PainSc: 0-No pain                 Alfhild Partch,JAMES TERRILL

## 2016-01-28 NOTE — Progress Notes (Signed)
Pt ambulated 125 feet. SPO2 95-100% on 3L [place]. Denies pain or SOB with ambulation but does complain of ongoing nausea despite Zofran being given 45 min prior to ambulation. Pt refused ambulation 3x earlier in shift (before HS) because of nausea.

## 2016-01-28 NOTE — Progress Notes (Signed)
T[facility] BRIEF SICU PROGRESS NOTE  4 Days Post-Op  S/P Procedure(s) (LRB): CORONARY ARTERY BYPASS GRAFTING (CABG) (N/A) TRANSESOPHAGEAL ECHOCARDIOGRAM (TEE) (N/A)   Stable day NSR w/ stable BP O2 sats 98% on 2 L/min UOP adequate  Plan: Continue current plan  Rexene Alberts, MD 01/28/2016 7:05 PM

## 2016-01-29 LAB — GLUCOSE, CAPILLARY
GLUCOSE-CAPILLARY: 117 mg/dL — AB (ref 65–99)
Glucose-Capillary: 71 mg/dL (ref 65–99)
Glucose-Capillary: 82 mg/dL (ref 65–99)
Glucose-Capillary: 87 mg/dL (ref 65–99)
Glucose-Capillary: 94 mg/dL (ref 65–99)
Glucose-Capillary: 95 mg/dL (ref 65–99)

## 2016-01-29 LAB — BASIC METABOLIC PANEL
ANION GAP: 7 (ref 5–15)
BUN: 19 mg/dL (ref 6–20)
CALCIUM: 8.5 mg/dL — AB (ref 8.9–10.3)
CO2: 25 mmol/L (ref 22–32)
CREATININE: 1.11 mg/dL — AB (ref 0.44–1.00)
Chloride: 108 mmol/L (ref 101–111)
GFR, EST AFRICAN AMERICAN: 54 mL/min — AB (ref >60)
GFR, EST NON AFRICAN AMERICAN: 47 mL/min — AB (ref >60)
GLUCOSE: 94 mg/dL (ref 65–99)
Potassium: 4 mmol/L (ref 3.5–5.1)
Sodium: 140 mmol/L (ref 135–145)

## 2016-01-29 MED ORDER — POTASSIUM CHLORIDE CRYS ER 20 MEQ PO TBCR
20.0000 meq | EXTENDED_RELEASE_TABLET | Freq: Every day | ORAL | Status: DC
  Administered 2016-01-30 – 2016-01-31 (×2): 20 meq via ORAL
  Filled 2016-01-29 (×2): qty 1

## 2016-01-29 MED ORDER — SODIUM CHLORIDE 0.9 % IV SOLN: 250.0000 mL | INTRAVENOUS | Status: DC | PRN

## 2016-01-29 MED ORDER — FUROSEMIDE 40 MG PO TABS: 40.0000 mg | ORAL_TABLET | Freq: Every day | ORAL | Status: DC

## 2016-01-29 MED ORDER — SODIUM CHLORIDE 0.9% FLUSH: 3.0000 mL | INTRAVENOUS | Status: DC | PRN

## 2016-01-29 MED ORDER — ASPIRIN EC 325 MG PO TBEC
325.0000 mg | DELAYED_RELEASE_TABLET | Freq: Every day | ORAL | Status: DC
  Administered 2016-01-30 – 2016-01-31 (×2): 325 mg via ORAL
  Filled 2016-01-29 (×2): qty 1

## 2016-01-29 MED ORDER — MOVING RIGHT ALONG BOOK
Freq: Once | Status: DC
  Filled 2016-01-29 (×2): qty 1

## 2016-01-29 MED ORDER — ACETAMINOPHEN 325 MG PO TABS: 650.0000 mg | ORAL_TABLET | Freq: Four times a day (QID) | ORAL | Status: DC | PRN

## 2016-01-29 MED ORDER — ONDANSETRON HCL 4 MG/2ML IJ SOLN: 4.0000 mg | Freq: Four times a day (QID) | INTRAMUSCULAR | Status: DC | PRN

## 2016-01-29 MED ORDER — SODIUM CHLORIDE 0.9% FLUSH
3.0000 mL | Freq: Two times a day (BID) | INTRAVENOUS | Status: DC
  Administered 2016-01-30: 3 mL via INTRAVENOUS

## 2016-01-29 MED ORDER — TRAMADOL HCL 50 MG PO TABS
50.0000 mg | ORAL_TABLET | ORAL | Status: DC | PRN
  Administered 2016-01-29: 100 mg via ORAL
  Administered 2016-01-30: 50 mg via ORAL
  Administered 2016-01-30: 100 mg via ORAL
  Filled 2016-01-29: qty 1
  Filled 2016-01-29 (×2): qty 2

## 2016-01-29 MED ORDER — ONDANSETRON HCL 4 MG PO TABS: 4.0000 mg | ORAL_TABLET | Freq: Four times a day (QID) | ORAL | Status: DC | PRN

## 2016-01-29 NOTE — Progress Notes (Addendum)
5 Days Post-Op Procedure(s) (LRB): CORONARY ARTERY BYPASS GRAFTING (CABG) (N/A) TRANSESOPHAGEAL ECHOCARDIOGRAM (TEE) (N/A) Subjective:  Feels better. Nausea resolved. Episode of diarrhea yesterday but none today.  Objective: Vital signs in last 24 hours: Temp:  [97.9 F (36.6 C)-98.8 F (37.1 C)] 98.2 F (36.8 C) (03/08 0409) Pulse Rate:  [71-108] 83 (03/08 0800) Cardiac Rhythm:  [-] Normal sinus rhythm;Bundle branch block (03/08 0745) Resp:  [10-29] 14 (03/08 0800) BP: (95-147)/(44-81) 119/52 mmHg (03/08 0800) SpO2:  [89 %-100 %] 89 % (03/08 0800) Weight:  [79.2 kg (174 lb 9.7 oz)] 79.2 kg (174 lb 9.7 oz) (03/08 0500)  Hemodynamic parameters for last 24 hours:    Intake/Output from previous day: 03/07 0701 - 03/08 0700 In: 565.7 [P.O.:480; I.V.:85.7] Out: 762 [Urine:760; Stool:2] Intake/Output this shift: Total I/O In: -  Out: 60 [Urine:60]  General appearance: alert and cooperative Heart: regular rate and rhythm, S1, S2 normal, no murmur, click, rub or gallop Lungs: clear to auscultation bilaterally Abdomen: soft, non-tender; bowel sounds normal; no masses,  no organomegaly Extremities: edema mild Wound: incision ok  Lab Results:  Recent Labs  01/27/16 0430 01/28/16 0508  WBC 7.4 6.5  HGB 7.6* 8.9*  HCT 23.8* 28.8*  PLT 108* 125*   BMET:  Recent Labs  01/28/16 0508 01/29/16 0218  NA 140 140  K 4.4 4.0  CL 105 108  CO2 25 25  GLUCOSE 98 94  BUN 19 19  CREATININE 1.17* 1.11*  CALCIUM 8.7* 8.5*    PT/INR: No results for input(s): LABPROT, INR in the last 72 hours. ABG    Component Value Date/Time   PHART 7.334* 01/25/2016 0533   HCO3 22.2 01/25/2016 0533   TCO2 24 01/25/2016 1640   ACIDBASEDEF 4.0* 01/25/2016 0533   O2SAT 95.0 01/25/2016 0533   CBG (last 3)   Recent Labs  01/28/16 1911 01/28/16 2339 01/29/16 0339  GLUCAP 79 87 95    Assessment/Plan: S/P Procedure(s) (LRB): CORONARY ARTERY BYPASS GRAFTING (CABG)  (N/A) TRANSESOPHAGEAL ECHOCARDIOGRAM (TEE) (N/A)  She is hemodynamically stable in sinus rhythm. Will hold off on beta blocker for now until diuresed since BP has been borderline low postop.  Renal function back to normal. She still have some volume excess with weight 8 lbs over preop. Will start oral diuretic.  Transfer to 2W and continue mobilization.  DC foley   LOS: 8 days    Gaye Pollack 01/29/2016

## 2016-01-29 NOTE — Care Management Important Message (Signed)
Important Message  Patient Details  Name: Jessica Taylor MRN: [id number] Date of Birth: 07/30/1941   Medicare Important Message Given:  Yes    [name] May 01/29/2016, 10:54 AM

## 2016-01-29 NOTE — Significant Event (Signed)
Patient transfer to 2W03, taken via wheelchair after ambulation. VS stable prior and during the transfer. Report given to receiving RN Tiffany. Personal belongings taken with patient (card and flowers). Family made aware of the transfer. Rozlynn Lippold, Therapist, sports.

## 2016-01-30 DIAGNOSIS — Z951 Presence of aortocoronary bypass graft: Secondary | ICD-10-CM

## 2016-01-30 LAB — BASIC METABOLIC PANEL
ANION GAP: 10 (ref 5–15)
BUN: 15 mg/dL (ref 6–20)
CALCIUM: 8.6 mg/dL — AB (ref 8.9–10.3)
CO2: 26 mmol/L (ref 22–32)
Chloride: 104 mmol/L (ref 101–111)
Creatinine, Ser: 1.09 mg/dL — ABNORMAL HIGH (ref 0.44–1.00)
GFR, EST AFRICAN AMERICAN: 56 mL/min — AB (ref >60)
GFR, EST NON AFRICAN AMERICAN: 48 mL/min — AB (ref >60)
Glucose, Bld: 106 mg/dL — ABNORMAL HIGH (ref 65–99)
POTASSIUM: 3.8 mmol/L (ref 3.5–5.1)
Sodium: 140 mmol/L (ref 135–145)

## 2016-01-30 LAB — GLUCOSE, CAPILLARY
GLUCOSE-CAPILLARY: 94 mg/dL (ref 65–99)
Glucose-Capillary: 90 mg/dL (ref 65–99)
Glucose-Capillary: 93 mg/dL (ref 65–99)

## 2016-01-30 LAB — CBC
HEMATOCRIT: 26.3 % — AB (ref 36.0–46.0)
Hemoglobin: 8.2 g/dL — ABNORMAL LOW (ref 12.0–15.0)
MCH: 27.7 pg (ref 26.0–34.0)
MCHC: 31.2 g/dL (ref 30.0–36.0)
MCV: 88.9 fL (ref 78.0–100.0)
PLATELETS: 170 10*3/uL (ref 150–400)
RBC: 2.96 MIL/uL — AB (ref 3.87–5.11)
RDW: 17.5 % — AB (ref 11.5–15.5)
WBC: 5.7 10*3/uL (ref 4.0–10.5)

## 2016-01-30 MED ORDER — METOPROLOL TARTRATE 12.5 MG HALF TABLET
12.5000 mg | ORAL_TABLET | Freq: Two times a day (BID) | ORAL | Status: DC
  Administered 2016-01-30 – 2016-01-31 (×3): 12.5 mg via ORAL
  Filled 2016-01-30 (×3): qty 1

## 2016-01-30 MED ORDER — FUROSEMIDE 40 MG PO TABS
40.0000 mg | ORAL_TABLET | Freq: Every day | ORAL | Status: DC
  Administered 2016-01-30 – 2016-01-31 (×2): 40 mg via ORAL
  Filled 2016-01-30 (×2): qty 1

## 2016-01-30 NOTE — Progress Notes (Signed)
CARDIAC REHAB PHASE I   PRE:  Rate/Rhythm: 72 SR    BP: sitting 90/52    SaO2: 90-91 RA  MODE:  Ambulation: 510 ft   POST:  Rate/Rhythm: 90 SR    BP: sitting 120/60     SaO2: 87-92 RA  Pt needed education and assist to get to edge of bed. Able to stand without difficulty. Ambulated with RW. SaO2 88-90 RA. Denied SOB. To recliner and she practiced IS, inspiring 1000 mL. Encouraged x1 more walk and more IS. [id number]  Darrick Meigs CES, ACSM 01/30/2016 3:15 PM

## 2016-01-30 NOTE — Discharge Instructions (Signed)
Coronary Artery Bypass Grafting, Care After °Refer to this sheet in the next few weeks. These instructions provide you with information on caring for yourself after your procedure. Your health care provider may also give you more specific instructions. Your treatment has been planned according to current medical practices, but problems sometimes occur. Call your health care provider if you have any problems or questions after your procedure. °WHAT TO EXPECT AFTER THE PROCEDURE °Recovery from surgery will be different for everyone. Some people feel well after 3 or 4 weeks, while for others it takes longer. After your procedure, it is typical to have the following: °· Nausea and a lack of appetite.   °· Constipation. °· Weakness and fatigue.   °· Depression or irritability.   °· Pain or discomfort at your incision site. °HOME CARE INSTRUCTIONS °· Take medicines only as directed by your health care provider. Do not stop taking medicines or start any new medicines without first checking with your health care provider. °· Take your pulse as directed by your health care provider. °· Perform deep breathing as directed by your health care provider. If you were given a device called an incentive spirometer, use it to practice deep breathing several times a day. Support your chest with a pillow or your arms when you take deep breaths or cough. °· Keep incision areas clean, dry, and protected. Remove or change any bandages (dressings) only as directed by your health care provider. You may have skin adhesive strips over the incision areas. Do not take the strips off. They will fall off on their own. °· Check incision areas daily for any swelling, redness, or drainage. °· If incisions were made in your legs, do the following: °¨ Avoid crossing your legs.   °¨ Avoid sitting for long periods of time. Change positions every 30 minutes.   °¨ Elevate your legs when you are sitting. °· Wear compression stockings as directed by your  health care provider. These stockings help keep blood clots from forming in your legs. °· Take showers once your health care provider approves. Until then, only take sponge baths. Pat incisions dry. Do not rub incisions with a washcloth or towel. Do not take baths, swim, or use a hot tub until your health care provider approves. °· Eat foods that are high in fiber, such as raw fruits and vegetables, whole grains, beans, and nuts. Meats should be lean cut. Avoid canned, processed, and fried foods. °· Drink enough fluid to keep your urine clear or pale yellow. °· Weigh yourself every day. This helps identify if you are retaining fluid that may make your heart and lungs work harder. °· Rest and limit activity as directed by your health care provider. You may be instructed to: °¨ Stop any activity at once if you have chest pain, shortness of breath, irregular heartbeats, or dizziness. Get help right away if you have any of these symptoms. °¨ Move around frequently for short periods or take short walks as directed by your health care provider. Increase your activities gradually. You may need physical therapy or cardiac rehabilitation to help strengthen your muscles and build your endurance. °¨ Avoid lifting, pushing, or pulling anything heavier than 10 lb (4.5 kg) for at least 6 weeks after surgery. °· Do not drive until your health care provider approves.  °· Ask your health care provider when you may return to work. °· Ask your health care provider when you may resume sexual activity. °· Keep all follow-up visits as directed by your health care   provider. This is important. °SEEK MEDICAL CARE IF: °· You have swelling, redness, increasing pain, or drainage at the site of an incision. °· You have a fever. °· You have swelling in your ankles or legs. °· You have pain in your legs.   °· You gain 2 or more pounds (0.9 kg) a day. °· You are nauseous or vomit. °· You have diarrhea.  °SEEK IMMEDIATE MEDICAL CARE IF: °· You have  chest pain that goes to your jaw or arms. °· You have shortness of breath.   °· You have a fast or irregular heartbeat.   °· You notice a "clicking" in your breastbone (sternum) when you move.   °· You have numbness or weakness in your arms or legs. °· You feel dizzy or light-headed.   °MAKE SURE YOU: °· Understand these instructions. °· Will watch your condition. °· Will get help right away if you are not doing well or get worse. °  °This information is not intended to replace advice given to you by your health care provider. Make sure you discuss any questions you have with your health care provider. °  °Document Released: 05/29/2005 Document Revised: 11/30/2014 Document Reviewed: 04/18/2013 °Elsevier Interactive Patient Education ©2016 [employer] ° °Endoscopic Saphenous Vein Harvesting, Care After °Refer to this sheet in the next few weeks. These instructions provide you with information on caring for yourself after your procedure. Your health care provider may also give you more specific instructions. Your treatment has been planned according to current medical practices, but problems sometimes occur. Call your health care provider if you have any problems or questions after your procedure. °HOME CARE INSTRUCTIONS °Medicine °· Take whatever pain medicine your surgeon prescribes. Follow the directions carefully. Do not take over-the-counter pain medicine unless your surgeon says it is okay. Some pain medicine can cause bleeding problems for several weeks after surgery. °· Follow your surgeon's instructions about driving. You will probably not be permitted to drive after heart surgery. °· Take any medicines your surgeon prescribes. Any medicines you took before your heart surgery should be checked with your health care provider before you start taking them again. °Wound care °· If your surgeon has prescribed an elastic bandage or stocking, ask how long you should wear it. °· Check the area around your surgical  cuts (incisions) whenever your bandages (dressings) are changed. Look for any redness or swelling. °· You will need to return to have the stitches (sutures) or staples taken out. Ask your surgeon when to do that. °· Ask your surgeon when you can shower or bathe. °Activity °· Try to keep your legs raised when you are sitting. °· Do any exercises your health care providers have given you. These may include deep breathing exercises, coughing, walking, or other exercises. °SEEK MEDICAL CARE IF: °· You have any questions about your medicines. °· You have more leg pain, especially if your pain medicine stops working. °· New or growing bruises develop on your leg. °· Your leg swells, feels tight, or becomes red. °· You have numbness in your leg. °SEEK IMMEDIATE MEDICAL CARE IF: °· Your pain gets much worse. °· Blood or fluid leaks from any of the incisions. °· Your incisions become warm, swollen, or red. °· You have chest pain. °· You have trouble breathing. °· You have a fever. °· You have more pain near your leg incision. °MAKE SURE YOU: °· Understand these instructions. °· Will watch your condition. °· Will get help right away if you are not doing well or   get worse. °  °This information is not intended to replace advice given to you by your health care provider. Make sure you discuss any questions you have with your health care provider. °  °Document Released: 07/22/2011 Document Revised: 11/30/2014 Document Reviewed: 07/22/2011 °Elsevier Interactive Patient Education ©2016 Elsevier Inc. ° ° °

## 2016-01-30 NOTE — Discharge Summary (Signed)
Physician Discharge Summary  Patient ID: Jessica Taylor MRN: [id number] DOB/AGE: 25-Oct-1941 75 y.o.  Admit date: 01/16/2016 Discharge date: 01/31/2016  Admission Diagnoses:  Patient Active Problem List   Diagnosis Date Noted  . 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 CAD (coronary artery disease)   . Coronary artery disease involving native coronary artery of native heart without angina pectoris   . Unstable angina ([place])   . Chest pain 01/17/2016  . Hematuria 01/17/2016  . Coronary artery disease involving native coronary artery of native heart with unstable angina pectoris ([place])   . Claudication ([place]) 06/27/2015  . Acute renal insufficiency post PCI- SCr1.34 03/08/2015  . STEMI- urgent CFX PCI 03/06/15 03/06/2015  . 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 Hypokalemia 03/06/2015  . Hyperglycemia 03/06/2015  . PAD (peripheral artery disease) ([place]) 03/06/2015  . Aortoiliac occlusive disease ([place])   . Elevated troponin 02/10/2015  . GERD (gastroesophageal reflux disease) 02/08/2015  . CAD S/P prior PCI to LAD and RCA 02/08/2015  . Hypothyroidism 02/08/2015  . Hypoxia 02/08/2015  . Status post right partial knee replacement   . Primary osteoarthritis of right knee 02/04/2015  . 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 Atherosclerosis of native coronary artery of native heart without angina pectoris 06/06/2014  . Mixed hyperlipidemia 06/06/2014  . Essential hypertension 06/06/2014   Discharge Diagnoses:   Patient Active Problem List   Diagnosis Date Noted  . 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 CAD (coronary artery disease)   . Coronary artery disease involving native coronary artery of native heart without angina pectoris   . Unstable angina ([place])   . Chest pain 01/17/2016  . Hematuria 01/17/2016  . Coronary artery disease involving native coronary artery of native heart with unstable angina pectoris ([place])   . Claudication ([place]) 06/27/2015  . Acute renal insufficiency post  PCI- SCr1.34 03/08/2015  . STEMI- urgent CFX PCI 03/06/15 03/06/2015  . 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 Hypokalemia 03/06/2015  . Hyperglycemia 03/06/2015  . PAD (peripheral artery disease) ([place]) 03/06/2015  . Aortoiliac occlusive disease ([place])   . Elevated troponin 02/10/2015  . GERD (gastroesophageal reflux disease) 02/08/2015  . CAD S/P prior PCI to LAD and RCA 02/08/2015  . Hypothyroidism 02/08/2015  . Hypoxia 02/08/2015  . Status post right partial knee replacement   . Primary osteoarthritis of right knee 02/04/2015  . 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 Atherosclerosis of native coronary artery of native heart without angina pectoris 06/06/2014  . Mixed hyperlipidemia 06/06/2014  . Essential hypertension 06/06/2014   Discharged Condition: good  History of Present Illness:  Jessica Taylor is a 75 yo female with hypothyroidism, Hyperlipidemia, stage III CKD, AAA, PAD S/P bilateral iliac PCI and stenting in 06/2015 and CAD S/P PCI/stenting of LAD and RCA, and most recently PCI of an occluded Left Circumflex after MI in 02/2015.  The patient was doing well without symptoms until about 2/21 when she started having recurrent exertional chest tightness relieved with rest and NTG.  She continued to have episodes of chest pain several days prior to admission when she awoke from sleep with chest tightness at which time she presented to the ED for evaluation.  EKG showed no changes and negative Troponins.  Echocardiogram showed preserved EF without valvular abnormalities.  Cardiac catheterization showed severe 3 vessel CAD with instent stenosis.  It was felt coronary bypass grafting would be indicated and  TCTS consult was obtained.  He was evaluated by Dr. Cyndia Bent who was in agreement the patient would benefit from Coronary bypass grafting procedure.  The risks and benefits of the procedure were explained to the patient and she was agreeable to proceed.  Hospital Course:    The patient had intermittent chest pain during admission.  She was treated with a Heparin drip.  She was taken to the operating room on 01/24/2016.  She underwent CABG x 3 utilizing LIMA to LAD, SVG to Ramus Intermediate, SVG to PDA.  She also underwent endoscopic harvest of the greater saphenous vein from the right thigh.  She tolerated the procedure and was taken to the SICU in stable condition.  During his stay in the SICU the patient was extubated without difficulty.  She was weaned off Neo as tolerated.  Her urinary output decreased and she was started on Dopamine.  Her urinary output improved and her Dopamine was weaned off as tolerated.  She was transfused 1 unit of packed cells for expected post operative blood loss anemia.  She had profound nausea for several days which resolved after use of anti-emetics.  She had some episodic diarrhea which also resolved.  Her chest tubes and arterial lines were removed without difficulty.  She was maintaining NSR and felt medically stable for transfer to the telemetry unit on POD #5.  She continues to make progress.  She was started on a Beta blocker as her BP allowed.  She was hypervolemic and diuresed with lasix.  She continued to maintain NSR and her pacing wires were removed.  She has been weaned off oxygen.  She continues to ambulate without difficulty.  She is tolerating a heart healthy diet.  She is felt medically stable for discharge home today.               Significant Diagnostic Studies: cardiac graphics: Cath:  1. Prox RCA to Mid RCA lesion, 40% stenosed. 2. Mid RCA lesion, 50% stenosed. 3. Dist RCA lesion, 95% stenosed. The lesion was previously treated with a stent (unknown type). 4. Ost Ramus to Ramus lesion, 65% stenosed. The lesion was previously treated with a stent (unknown type) greater than two years ago. 5. Ost 1st Mrg to 1st Mrg lesion, 60% stenosed. 6. Prox LAD lesion, 40% stenosed. The lesion was previously treated with a stent  (unknown type). 7. Mid LAD-2 lesion, 50% stenosed. The lesion was previously treated with a stent (unknown type) greater than two years ago. 8. Mid LAD-1 lesion, 90% stenosed.  1. Severe 3 vessel obstructive CAD.   - 90% proximal to mid LAD between prior stents. This segment is diffusely diseased and calcified. It is an acutely angulated segment.   - moderate diffuse disease in the ramus intermediate- in-stent  - severe 95% distal RCA at distal margin of old stent. 2. Normal LVEDP.  Treatments: surgery:   1. Median Sternotomy 2. Extracorporeal circulation 3. Coronary artery bypass grafting x 3   Left internal mammary graft to the LAD  SVG to Ramus  SVG to PDA  4. Endoscopic vein harvest from the right leg 5. Insertion of left femoral arterial line for blood pressure monitoring.  Disposition: 01-Home or Self Care   Discharge Medications:  The patient has been discharged on:   1.Beta Blocker:  Yes [x   ]  No   [   ]                              If No, reason:  2.Ace Inhibitor/ARB: Yes [   ]                                     No  [ x   ]                                     If No, reason: labile BP 3.Statin:   Yes [ x  ]                  No  [   ]                  If No, reason:  4.Ecasa:  Yes  [ x  ]                  No   [   ]                  If No, reason:     Medication List    STOP taking these medications        amLODipine 5 MG tablet  Commonly known as:  NORVASC     HYDROcodone-acetaminophen 5-325 MG tablet  Commonly known as:  NORCO/VICODIN     nitroGLYCERIN 0.4 MG SL tablet  Commonly known as:  NITROSTAT      TAKE these medications        aspirin EC 81 MG tablet  Take 81 mg by mouth daily.     atorvastatin 40 MG tablet  Commonly known as:  LIPITOR  Take 1 tablet (40 mg total) by mouth daily.     clopidogrel 75 MG tablet  Commonly known as:  PLAVIX  Take 1 tablet (75 mg total) by mouth daily.      esomeprazole 40 MG capsule  Commonly known as:  NEXIUM  TAKE 1 CAPSULE BY MOUTH DAILY AT NOON.     furosemide 20 MG tablet  Commonly known as:  LASIX  Take one tablet by mouth daily except on Monday, Wednesday and Friday, take two tablets     levothyroxine 50 MCG tablet  Commonly known as:  SYNTHROID, LEVOTHROID  Take 50 mcg by mouth daily before breakfast.     metoprolol tartrate 25 MG tablet  Commonly known as:  LOPRESSOR  Take 0.5 tablets (12.5 mg total) by mouth 2 (two) times daily.     potassium chloride 10 MEQ tablet  Commonly known as:  K-DUR  Take 1 tablet (10 mEq total) by mouth 2 (two) times daily. Take 1 tablet by mouth twice a day for 4 days then take 1 tablet by mouth 1 time a day     THERA TEARS ALLERGY OP  Apply 1 drop to eye 2 (two) times daily as needed (dry eyes).     traMADol 50 MG tablet  Commonly known as:  ULTRAM  Take 1-2 tablets (50-100 mg total) by mouth every 4 (four) hours as needed for moderate pain.       Follow-up Information    Follow up with Gaye Pollack, MD On 02/26/2016.  Specialty:  Cardiothoracic Surgery   Why:  Appointment is at 10:30   Contact information:   [address] [place] [address] [phone]       Follow up with [facility] On 02/26/2016.   Why:  please get CXR at 10:0. located on first floor of our office building   Contact information:   [facility]       Follow up with Erlene Quan, PA-C On 02/14/2016.   Specialties:  Cardiology, Radiology   Why:  Appointment is at 3:30   Contact information:   [address] [place] [address] [phone]       Signed: Ellwood Handler 01/31/2016, 8:17 AM

## 2016-01-30 NOTE — Progress Notes (Signed)
At 11:15am pulled patients epicardial pacing wires.  2 sutures removed. Patient tolerated well. No complaints of pain. Wires were intact. Best rest for one hour. Will continue to monitor.  Cyndia Bent

## 2016-01-30 NOTE — Progress Notes (Addendum)
      [address]       [place] [address]             [phone]      6 Days Post-Op Procedure(s) (LRB): CORONARY ARTERY BYPASS GRAFTING (CABG) (N/A) TRANSESOPHAGEAL ECHOCARDIOGRAM (TEE) (N/A)   Subjective:  Jessica Taylor is doing better this morning.  No further episodes of diarrhea.  She is ambulating with assistance.  Objective: Vital signs in last 24 hours: Temp:  [98.2 F (36.8 C)-99.4 F (37.4 C)] 98.8 F (37.1 C) (03/09 0430) Pulse Rate:  [82-93] 82 (03/09 0430) Cardiac Rhythm:  [-] Normal sinus rhythm (03/08 1900) Resp:  [15-29] 18 (03/09 0430) BP: (122-134)/(55-78) 133/68 mmHg (03/09 0430) SpO2:  [95 %-100 %] 97 % (03/09 0430) FiO2 (%):  [0 %] 0 % (03/08 1928) Weight:  [172 lb 1.6 oz (78.064 kg)] 172 lb 1.6 oz (78.064 kg) (03/09 0442)  Intake/Output from previous day: 03/08 0701 - 03/09 0700 In: 480 [P.O.:480] Out: 805 [Urine:805]  General appearance: alert, cooperative and no distress Heart: regular rate and rhythm Lungs: clear to auscultation bilaterally Abdomen: soft, non-tender; bowel sounds normal; no masses,  no organomegaly Extremities: edema trace Wound: clean and dry  Lab Results:  Recent Labs  01/28/16 0508 01/30/16 0210  WBC 6.5 5.7  HGB 8.9* 8.2*  HCT 28.8* 26.3*  PLT 125* 170   BMET:  Recent Labs  01/29/16 0218 01/30/16 0210  NA 140 140  K 4.0 3.8  CL 108 104  CO2 25 26  GLUCOSE 94 106*  BUN 19 15  CREATININE 1.11* 1.09*  CALCIUM 8.5* 8.6*    PT/INR: No results for input(s): LABPROT, INR in the last 72 hours. ABG    Component Value Date/Time   PHART 7.334* 01/25/2016 0533   HCO3 22.2 01/25/2016 0533   TCO2 24 01/25/2016 1640   ACIDBASEDEF 4.0* 01/25/2016 0533   O2SAT 95.0 01/25/2016 0533   CBG (last 3)   Recent Labs  01/29/16 1653 01/29/16 2131 01/30/16 0624  GLUCAP 94 117* 93    Assessment/Plan: S/P Procedure(s) (LRB): CORONARY ARTERY BYPASS GRAFTING (CABG) (N/A) TRANSESOPHAGEAL  ECHOCARDIOGRAM (TEE) (N/A)  1. CV- NSR, BP is improved- will start low dose beta blocker with parameters set 2. Pulm- no acute issues, weaning oxygen as tolerated 3. Renal- creatinine continues to trend down, currently at 1.09- remains hypervolemia, Lasix ordered yesterday, however she did not receive her dose.... Will adjust to AM dosing for 3 doses 4. Expected post operative blood loss anemia- Hgb is stable at 8.2 5. Dispo- patient stable, will d/c EPW, continue Lasix, BB ordered with parameters, possibly ready for d/c in next 24-48 hours   LOS: 9 days    Taylor, Jessica 01/30/2016   Chart reviewed, patient examined, agree with above.  Weight is still 7 lbs over preop. Continue diuretic. She can go home when oxygen weaned off.

## 2016-01-31 MED ORDER — TRAMADOL HCL 50 MG PO TABS: 50.0000 mg | ORAL_TABLET | ORAL | Status: DC | PRN

## 2016-01-31 MED ORDER — METOPROLOL TARTRATE 25 MG PO TABS: 12.5000 mg | ORAL_TABLET | Freq: Two times a day (BID) | ORAL | Status: DC

## 2016-01-31 NOTE — Progress Notes (Signed)
Ed completed with pt and husband. Voiced understanding and requests referral be sent to [facility]. [id number] Yves Dill CES, ACSM 10:38 AM 01/31/2016

## 2016-01-31 NOTE — Care Management Note (Addendum)
Case Management Note  Patient Details  Name: IVANI LUNDEEN MRN: [id number] Date of Birth: Mar 18, 1941  Subjective/Objective:  CABG X3                  Action/Plan: Discharge Planning: AVS reviewed NCM spoke to pt and husband at bedside. Pt states she has RW and 3n1 at home. She has no difficulty with paying for medications. Her pharmacy delivers to the home. Husband at home to assist with care. No HH recommended at dc.   Ericka Pontiff MD  Expected Discharge Date:  01/31/2016              Expected Discharge Plan:  Home/Self Care  In-House Referral:  NA  Discharge planning Services  CM Consult  Post Acute Care Choice:  NA Choice offered to:  NA  DME Arranged:  N/A DME Agency:  NA  HH Arranged:  NA HH Agency:  NA  Status of Service:  Completed, signed off  Medicare Important Message Given:  Yes Date Medicare IM Given:    Medicare IM give by:    Date Additional Medicare IM Given:    Additional Medicare Important Message give by:     If discussed at [place] of Stay Meetings, dates discussed:    Additional Comments:  Erenest Rasher, RN 01/31/2016, 10:35 AM

## 2016-02-07 ENCOUNTER — Ambulatory Visit (INDEPENDENT_AMBULATORY_CARE_PROVIDER_SITE_OTHER): Payer: Self-pay

## 2016-02-07 DIAGNOSIS — Z951 Presence of aortocoronary bypass graft: Secondary | ICD-10-CM

## 2016-02-07 DIAGNOSIS — Z4802 Encounter for removal of sutures: Secondary | ICD-10-CM

## 2016-02-07 NOTE — Progress Notes (Signed)
Removed 3 sutures from incision sites with no signs of infection and patient tolerated well. 

## 2016-02-14 ENCOUNTER — Telehealth: Payer: Self-pay | Admitting: Cardiology

## 2016-02-14 ENCOUNTER — Encounter: Payer: Self-pay | Admitting: Cardiology

## 2016-02-14 ENCOUNTER — Ambulatory Visit (INDEPENDENT_AMBULATORY_CARE_PROVIDER_SITE_OTHER): Payer: Medicare Other | Admitting: Cardiology

## 2016-02-14 VITALS — BP 110/66 | HR 99 | Ht 63.0 in | Wt 157.4 lb

## 2016-02-14 DIAGNOSIS — I10 Essential (primary) hypertension: Secondary | ICD-10-CM

## 2016-02-14 DIAGNOSIS — I2510 Atherosclerotic heart disease of native coronary artery without angina pectoris: Secondary | ICD-10-CM | POA: Diagnosis not present

## 2016-02-14 DIAGNOSIS — Z9861 Coronary angioplasty status: Secondary | ICD-10-CM

## 2016-02-14 DIAGNOSIS — K219 Gastro-esophageal reflux disease without esophagitis: Secondary | ICD-10-CM

## 2016-02-14 DIAGNOSIS — Z951 Presence of aortocoronary bypass graft: Secondary | ICD-10-CM | POA: Diagnosis not present

## 2016-02-14 DIAGNOSIS — I739 Peripheral vascular disease, unspecified: Secondary | ICD-10-CM

## 2016-02-14 LAB — CBC WITH DIFFERENTIAL/PLATELET
Basophils Absolute: 0 K/uL (ref 0.0–0.1)
Basophils Relative: 0 % (ref 0–1)
Eosinophils Absolute: 0 K/uL (ref 0.0–0.7)
Eosinophils Relative: 0 % (ref 0–5)
HCT: 34.8 % — ABNORMAL LOW (ref 36.0–46.0)
Hemoglobin: 11.2 g/dL — ABNORMAL LOW (ref 12.0–15.0)
Lymphocytes Relative: 8 % — ABNORMAL LOW (ref 12–46)
Lymphs Abs: 1.2 K/uL (ref 0.7–4.0)
MCH: 26.6 pg (ref 26.0–34.0)
MCHC: 32.2 g/dL (ref 30.0–36.0)
MCV: 82.7 fL (ref 78.0–100.0)
MPV: 10.1 fL (ref 8.6–12.4)
Monocytes Absolute: 1.2 K/uL — ABNORMAL HIGH (ref 0.1–1.0)
Monocytes Relative: 8 % (ref 3–12)
Neutro Abs: 12.9 K/uL — ABNORMAL HIGH (ref 1.7–7.7)
Neutrophils Relative %: 84 % — ABNORMAL HIGH (ref 43–77)
Platelets: 344 K/uL (ref 150–400)
RBC: 4.21 MIL/uL (ref 3.87–5.11)
RDW: 17.2 % — ABNORMAL HIGH (ref 11.5–15.5)
WBC: 15.3 K/uL — ABNORMAL HIGH (ref 4.0–10.5)

## 2016-02-14 LAB — BASIC METABOLIC PANEL
BUN: 19 mg/dL (ref 7–25)
CO2: 23 mmol/L (ref 20–31)
Calcium: 9.3 mg/dL (ref 8.6–10.4)
Chloride: 101 mmol/L (ref 98–110)
Creat: 1.36 mg/dL — ABNORMAL HIGH (ref 0.60–0.93)
Glucose, Bld: 125 mg/dL — ABNORMAL HIGH (ref 65–99)
Potassium: 4.5 mmol/L (ref 3.5–5.3)
Sodium: 137 mmol/L (ref 135–146)

## 2016-02-14 MED ORDER — PROMETHAZINE HCL 25 MG RE SUPP
25.0000 mg | Freq: Four times a day (QID) | RECTAL | Status: DC | PRN
Start: 2016-02-14 — End: 2016-03-24

## 2016-02-14 NOTE — Telephone Encounter (Signed)
  Was notified by [facility] labs of critical lab valve. WBC elevated at 15.3. Up from 5.7 2 weeks ago. She is not on prednisone. Per office note, patient has complained of several day h/o n/v/d. I called patient and spoke with daughter. Her daughter states that she has been running a fever and poor PO intake. I advised that given her symptoms, fever and elevated WBC that she check in with her PCP or urgent care /ED for further evaluation as she may need antibiotics. Patient encouraged to stay well hydrated with fluids.   Monetta Lick, Silas Flood 02/14/2016

## 2016-02-14 NOTE — Patient Instructions (Signed)
Stat Lab today  [facility] )     Your physician recommends that you schedule a follow-up appointment with Kerin Ransom PA    Tuesday 03/03/16 at 1:30 pm.

## 2016-02-14 NOTE — Assessment & Plan Note (Signed)
On PPI- no history of GI bleeding

## 2016-02-14 NOTE — Assessment & Plan Note (Signed)
CABG x 3 March 3d 2017

## 2016-02-14 NOTE — Progress Notes (Signed)
02/14/2016 DICK BURDGE   [birth year]  [id number]  Primary Physician [name], MD Primary Cardiologist: Dr Gwenlyn Found  HPI:  75 year old woman with a history of hypothyroidism, hyperlipidemia, stage III CKD, AAA, PAD s/p bilateral iliac PCI and stenting in 06/2015, and CAD s/p PCI/stenting of the LAD and RCA, PCI of an occluded LCX after MI in 02/2015. She was doing well without any symptoms until about 01/14/16 when she started having recurrent exertional chest tightness relieved with rest and NTG. Tropnin was WNL. Echo showed normal LVEF of 55-60% with grade 2 diastolic dysfunction, no AS or AI, no MR. Cardiac cath showed severe 3 vessel CAD. She had CABG x 3 LIMA-LAD, SVG-RI, SVG-PDA 01/24/16. Post op course was unremarkable. She was doing well till earlier this week when she noted increasing anorexia and weakness. She is seen in the office for further evaluation. She had one episode of vomiting. She has had some loose stools.    Current Outpatient Prescriptions  Medication Sig Dispense Refill  . aspirin EC 81 MG tablet Take 81 mg by mouth daily.     Marland Kitchen atorvastatin (LIPITOR) 40 MG tablet Take 1 tablet (40 mg total) by mouth daily. 30 tablet 11  . clopidogrel (PLAVIX) 75 MG tablet Take 1 tablet (75 mg total) by mouth daily. 30 tablet 11  . esomeprazole (NEXIUM) 40 MG capsule TAKE 1 CAPSULE BY MOUTH DAILY AT NOON. 30 capsule 1  . levothyroxine (SYNTHROID, LEVOTHROID) 50 MCG tablet Take 50 mcg by mouth daily before breakfast.    . metoprolol tartrate (LOPRESSOR) 25 MG tablet Take 0.5 tablets (12.5 mg total) by mouth 2 (two) times daily. 60 tablet 3  . traMADol (ULTRAM) 50 MG tablet Take 1-2 tablets (50-100 mg total) by mouth every 4 (four) hours as needed for moderate pain. 30 tablet 0   Current Facility-Administered Medications  Medication Dose Route Frequency Provider Last Rate Last Dose  . diphenhydrAMINE (BENADRYL) injection 25 mg  25 mg Intravenous Once Lorretta Harp, MD      .  famotidine (PEPCID) 20 mg in sodium chloride 0.9 % 50 mL IVPB  20 mg Intravenous Once Lorretta Harp, MD        Allergies  Allergen Reactions  . Shrimp [Shellfish Allergy] Nausea And Vomiting    Throws up violently    Social History   Social History  . Marital Status: Married    Spouse Name: N/A  . Number of Children: N/A  . Years of Education: N/A   Occupational History  . Not on file.   Social History Main Topics  . Smoking status: Former Smoker -- 1.50 packs/day for 42 years    Types: Cigarettes    Quit date: 05/19/1999  . Smokeless tobacco: Never Used  . Alcohol Use: No  . Drug Use: No  . Sexual Activity: Yes    Birth Control/ Protection: Surgical   Other Topics Concern  . Not on file   Social History Narrative     Review of Systems: General: negative for chills, fever, night sweats or weight changes.  Cardiovascular: negative for chest pain, dyspnea on exertion, edema, orthopnea, palpitations, paroxysmal nocturnal dyspnea or shortness of breath Dermatological: negative for rash Respiratory: negative for cough or wheezing Urologic: negative for hematuria Abdominal: negative for nausea, vomiting, diarrhea, bright red blood per rectum, melena, or hematemesis Neurologic: negative for visual changes, syncope, or dizziness All other systems reviewed and are otherwise negative except as noted above.  Blood pressure 110/66, pulse 99, height 5\' 3"  (1.6 m), weight 157 lb 6.4 oz (71.396 kg). O2 sat 93% on RA General appearance: alert, cooperative, no distress and mildly obese Lungs: few basilar crackles Heart: regular rate and rhythm Abdomen: soft, non-tender; bowel sounds normal; no masses,  no organomegaly Extremities: no edema Skin: cool, pale, dry Neurologic: Grossly normal  EKG NSR, NSST changes  ASSESSMENT AND PLAN:   S/P CABG x 3 CABG x 3 March 3d 2017  GERD (gastroesophageal reflux disease) On PPI- no history of GI bleeding  PAD (peripheral  artery disease) PVD s/p PTA Aug 2016  CAD S/P prior PCI to LAD and RCA PCI- April 2016   PLAN  I suggested we obtain STAT labs- CBC and BMP. I told her to liberalize her fluid intake over the weekend. I explained that some degree of anorexia is not uncommon post CABG and usually resolves. It is concerning that she initially did well but has taken a downward turn the last few days.   Kerin Ransom K PA-C 02/14/2016 4:08 PM

## 2016-02-14 NOTE — Assessment & Plan Note (Signed)
PCI- April 2016

## 2016-02-14 NOTE — Assessment & Plan Note (Signed)
PVD s/p PTA Aug 2016

## 2016-02-21 ENCOUNTER — Encounter (HOSPITAL_COMMUNITY): Payer: Self-pay | Admitting: Family Medicine

## 2016-02-21 ENCOUNTER — Emergency Department (HOSPITAL_COMMUNITY)
Admission: EM | Admit: 2016-02-21 | Discharge: 2016-02-21 | Disposition: A | Discharge status: Home / Self Care | Payer: Medicare Other | Attending: Emergency Medicine | Admitting: Emergency Medicine

## 2016-02-21 ENCOUNTER — Emergency Department (HOSPITAL_COMMUNITY): Payer: Medicare Other

## 2016-02-21 DIAGNOSIS — Z9851 Tubal ligation status: Secondary | ICD-10-CM | POA: Insufficient documentation

## 2016-02-21 DIAGNOSIS — Z87891 Personal history of nicotine dependence: Secondary | ICD-10-CM | POA: Insufficient documentation

## 2016-02-21 DIAGNOSIS — Z9861 Coronary angioplasty status: Secondary | ICD-10-CM | POA: Insufficient documentation

## 2016-02-21 DIAGNOSIS — Z9889 Other specified postprocedural states: Secondary | ICD-10-CM | POA: Diagnosis not present

## 2016-02-21 DIAGNOSIS — R109 Unspecified abdominal pain: Secondary | ICD-10-CM | POA: Diagnosis present

## 2016-02-21 DIAGNOSIS — Z862 Personal history of diseases of the blood and blood-forming organs and certain disorders involving the immune mechanism: Secondary | ICD-10-CM | POA: Diagnosis not present

## 2016-02-21 DIAGNOSIS — I2510 Atherosclerotic heart disease of native coronary artery without angina pectoris: Secondary | ICD-10-CM | POA: Insufficient documentation

## 2016-02-21 DIAGNOSIS — Z7982 Long term (current) use of aspirin: Secondary | ICD-10-CM | POA: Diagnosis not present

## 2016-02-21 DIAGNOSIS — Z951 Presence of aortocoronary bypass graft: Secondary | ICD-10-CM | POA: Insufficient documentation

## 2016-02-21 DIAGNOSIS — E7800 Pure hypercholesterolemia, unspecified: Secondary | ICD-10-CM | POA: Diagnosis not present

## 2016-02-21 DIAGNOSIS — I10 Essential (primary) hypertension: Secondary | ICD-10-CM | POA: Insufficient documentation

## 2016-02-21 DIAGNOSIS — N12 Tubulo-interstitial nephritis, not specified as acute or chronic: Secondary | ICD-10-CM | POA: Insufficient documentation

## 2016-02-21 DIAGNOSIS — K219 Gastro-esophageal reflux disease without esophagitis: Secondary | ICD-10-CM | POA: Diagnosis not present

## 2016-02-21 DIAGNOSIS — E039 Hypothyroidism, unspecified: Secondary | ICD-10-CM | POA: Insufficient documentation

## 2016-02-21 DIAGNOSIS — Z79899 Other long term (current) drug therapy: Secondary | ICD-10-CM | POA: Insufficient documentation

## 2016-02-21 DIAGNOSIS — I252 Old myocardial infarction: Secondary | ICD-10-CM | POA: Diagnosis not present

## 2016-02-21 DIAGNOSIS — M158 Other polyosteoarthritis: Secondary | ICD-10-CM | POA: Diagnosis not present

## 2016-02-21 DIAGNOSIS — Z85828 Personal history of other malignant neoplasm of skin: Secondary | ICD-10-CM | POA: Insufficient documentation

## 2016-02-21 DIAGNOSIS — Z7902 Long term (current) use of antithrombotics/antiplatelets: Secondary | ICD-10-CM | POA: Insufficient documentation

## 2016-02-21 LAB — URINALYSIS, ROUTINE W REFLEX MICROSCOPIC
GLUCOSE, UA: NEGATIVE mg/dL
Ketones, ur: NEGATIVE mg/dL
Nitrite: NEGATIVE
PROTEIN: 30 mg/dL — AB
Specific Gravity, Urine: 1.025 (ref 1.005–1.030)
pH: 5.5 (ref 5.0–8.0)

## 2016-02-21 LAB — BASIC METABOLIC PANEL
ANION GAP: 12 (ref 5–15)
BUN: 15 mg/dL (ref 6–20)
CHLORIDE: 105 mmol/L (ref 101–111)
CO2: 22 mmol/L (ref 22–32)
CREATININE: 1.16 mg/dL — AB (ref 0.44–1.00)
Calcium: 9.6 mg/dL (ref 8.9–10.3)
GFR calc non Af Amer: 44 mL/min — ABNORMAL LOW (ref >60)
GFR, EST AFRICAN AMERICAN: 51 mL/min — AB (ref >60)
Glucose, Bld: 122 mg/dL — ABNORMAL HIGH (ref 65–99)
Potassium: 4 mmol/L (ref 3.5–5.1)
SODIUM: 139 mmol/L (ref 135–145)

## 2016-02-21 LAB — URINE MICROSCOPIC-ADD ON

## 2016-02-21 LAB — I-STAT TROPONIN, ED: Troponin i, poc: 0 ng/mL (ref 0.00–0.08)

## 2016-02-21 LAB — CBC
HEMATOCRIT: 36.1 % (ref 36.0–46.0)
Hemoglobin: 11.1 g/dL — ABNORMAL LOW (ref 12.0–15.0)
MCH: 26.4 pg (ref 26.0–34.0)
MCHC: 30.7 g/dL (ref 30.0–36.0)
MCV: 86 fL (ref 78.0–100.0)
PLATELETS: 336 10*3/uL (ref 150–400)
RBC: 4.2 MIL/uL (ref 3.87–5.11)
RDW: 17.6 % — ABNORMAL HIGH (ref 11.5–15.5)
WBC: 11 10*3/uL — AB (ref 4.0–10.5)

## 2016-02-21 MED ORDER — SODIUM CHLORIDE 0.9 % IV BOLUS (SEPSIS)
500.0000 mL | Freq: Once | INTRAVENOUS | Status: AC
  Administered 2016-02-21: 500 mL via INTRAVENOUS

## 2016-02-21 MED ORDER — TRAMADOL HCL 50 MG PO TABS: 50.0000 mg | ORAL_TABLET | Freq: Four times a day (QID) | ORAL | Status: DC | PRN

## 2016-02-21 MED ORDER — CEPHALEXIN 250 MG PO CAPS: 250.0000 mg | ORAL_CAPSULE | Freq: Four times a day (QID) | ORAL | Status: DC

## 2016-02-21 MED ORDER — TRAMADOL HCL 50 MG PO TABS: 50.0000 mg | ORAL_TABLET | Freq: Once | ORAL | Status: DC

## 2016-02-21 MED ORDER — DEXTROSE 5 % IV SOLN
1.0000 g | Freq: Once | INTRAVENOUS | Status: AC
  Administered 2016-02-21: 1 g via INTRAVENOUS
  Filled 2016-02-21: qty 10

## 2016-02-21 NOTE — ED Notes (Signed)
Pt off unit for imaging

## 2016-02-21 NOTE — ED Provider Notes (Signed)
CSN: [id number]     Arrival date & time 02/21/16  1630 History   First MD Initiated Contact with Patient 02/21/16 1814     Chief Complaint  Patient presents with  . Flank Pain     (Consider location/radiation/quality/duration/timing/severity/associated sxs/prior Treatment) HPI Comments: Patient is a 75 year old female with a history of coronary artery disease with recent CABG 4 weeks ago who presents today with ongoing nausea, dark urine and left-sided pain. Patient states she felt pretty good when she left the hospital however the last 2 weeks she slowly been declining. She was noted to have fever nausea and vomiting last week but states the fever has seemed to resolve but she continues to have severe nausea, decreased oral intake and anorexia. In the last 1 week she's also developed left-sided pain which is in her back and flank and rib area. The pain is worse with deep breathing and coughing but unable to be reproduced by moving her arm or pushing on her chest. She denies any significant shortness of breath or swelling. No rashes or recent antibiotics. She is having normal bowel movements with no report of diarrhea at this time. She states her urine is foul-smelling and has been very dark lately. No prior history of kidney stones.  Patient is a 75 y.o. female presenting with flank pain. The history is provided by the patient.  Flank Pain This is a new problem. Episode onset: One-week. The problem occurs constantly. The problem has been gradually worsening. Associated symptoms include chest pain. Pertinent negatives include no abdominal pain. Associated symptoms comments: Severe ongoing nausea and intermittent fever. The symptoms are aggravated by coughing (Deep breathing). Nothing relieves the symptoms. Treatments tried: Nausea medicine. The treatment provided no relief.    Past Medical History  Diagnosis Date  . Acid reflux   . Hypothyroidism   . MI, old 19  . Hypercholesteremia   .  Vertigo     when lays on left side.  . Cancer ([place])     basal cell on nose  . Anemia   . Coronary artery disease   . Hypertension   . Arthritis     knees , R shoulder - tx /w injection - 11/2014  . Tobacco abuse   . Abdominal aortic aneurysm ([place])   . Peripheral arterial disease ([facility])     hhigh-grade ostial bilateral calcified iliac stenosis with claudication   Past Surgical History  Procedure Laterality Date  . Coronary stent placement    . Cardiac catheterization  5 stents  . Tubal ligation    . Cataract extraction w/phaco Left 05/24/2014    Procedure: CATARACT EXTRACTION PHACO AND INTRAOCULAR LENS PLACEMENT (IOC);  Surgeon: Tonny Branch, MD;  Location: [facility];  Service: Ophthalmology;  Laterality: Left;  CDE:  9.30  . Cataract extraction w/phaco Right 06/18/2014    Procedure: CATARACT EXTRACTION PHACO AND INTRAOCULAR LENS PLACEMENT RIGHT EYE CDE=10.84;  Surgeon: Tonny Branch, MD;  Location: [facility];  Service: Ophthalmology;  Laterality: Right;  . Eye surgery    . Tonsillectomy      age 75  . Partial knee arthroplasty Right 02/04/2015    Procedure: UNICOMPARTMENTAL KNEE;  Surgeon: Dorna Leitz, MD;  Location: [facility];  Service: Orthopedics;  Laterality: Right;  . Left heart catheterization with coronary angiogram N/A 03/06/2015    Procedure: LEFT HEART CATHETERIZATION WITH CORONARY ANGIOGRAM;  Surgeon: Lorretta Harp, MD;  Location: [facility] CATH LAB;  Service: Cardiovascular;  Laterality: N/A;  . Coronary stent placement  03/06/15    CFX DES  . Peripheral vascular catheterization Bilateral 05/13/2015    Procedure: Lower Extremity Angiography;  Surgeon: Lorretta Harp, MD;  Location: [place] CV LAB;  Service: Cardiovascular;  Laterality: Bilateral;  . Peripheral vascular catheterization N/A 05/13/2015    Procedure: Abdominal Aortogram;  Surgeon: Lorretta Harp, MD;  Location: [place] CV LAB;  Service: Cardiovascular;  Laterality: N/A;  . Peripheral vascular catheterization Bilateral  06/27/2015    Procedure: Peripheral Vascular Intervention;  Surgeon: Lorretta Harp, MD;  Location: [place] CV LAB;  Service: Cardiovascular;  Laterality: Bilateral;  ILIACS  . Peripheral vascular catheterization Bilateral 06/27/2015    Procedure: Peripheral Vascular Atherectomy;  Surgeon: Lorretta Harp, MD;  Location: [place] CV LAB;  Service: Cardiovascular;  Laterality: Bilateral;  . Cardiac catheterization N/A 01/20/2016    Procedure: Left Heart Cath and Coronary Angiography;  Surgeon: Peter M Martinique, MD;  Location: [place] [facility] CV LAB;  Service: Cardiovascular;  Laterality: N/A;  . Coronary artery bypass graft N/A 01/24/2016    Procedure: CORONARY ARTERY BYPASS GRAFTING (CABG);  Surgeon: Gaye Pollack, MD;  Location: [place];  Service: Open Heart Surgery;  Laterality: N/A;  . Tee without cardioversion N/A 01/24/2016    Procedure: TRANSESOPHAGEAL ECHOCARDIOGRAM (TEE);  Surgeon: Gaye Pollack, MD;  Location: [facility];  Service: Open Heart Surgery;  Laterality: N/A;   Family History  Problem Relation Age of Onset  . Cancer Mother 40  . Cancer Father 27  . Heart attack Neg Hx   . Stroke Maternal Grandfather   . Hypertension Maternal Grandmother   . Hypertension Son    Social History  Substance Use Topics  . Smoking status: Former Smoker -- 1.50 packs/day for 42 years    Types: Cigarettes    Quit date: 05/19/1999  . Smokeless tobacco: Never Used  . Alcohol Use: No   OB History    No data available     Review of Systems  Cardiovascular: Positive for chest pain.  Gastrointestinal: Negative for abdominal pain.  Genitourinary: Positive for flank pain.  All other systems reviewed and are negative.     Allergies  Shrimp  Home Medications   Prior to Admission medications   Medication Sig Start Date End Date Taking? Authorizing Provider  aspirin EC 81 MG tablet Take 81 mg by mouth daily.     Historical Provider, MD  atorvastatin (LIPITOR) 40 MG tablet Take 1 tablet (40 mg  total) by mouth daily. 07/18/15   Jerline Pain, MD  clopidogrel (PLAVIX) 75 MG tablet Take 1 tablet (75 mg total) by mouth daily. 07/18/15   Jerline Pain, MD  esomeprazole (NEXIUM) 40 MG capsule TAKE 1 CAPSULE BY MOUTH DAILY AT NOON. 12/27/15   Jerline Pain, MD  levothyroxine (SYNTHROID, LEVOTHROID) 50 MCG tablet Take 50 mcg by mouth daily before breakfast.    Historical Provider, MD  metoprolol tartrate (LOPRESSOR) 25 MG tablet Take 0.5 tablets (12.5 mg total) by mouth 2 (two) times daily. 01/31/16   Erin R Barrett, PA-C  promethazine (PHENERGAN) 25 MG suppository Place 1 suppository (25 mg total) rectally every 6 (six) hours as needed for nausea or vomiting. 02/14/16   Erlene Quan, PA-C  traMADol (ULTRAM) 50 MG tablet Take 1-2 tablets (50-100 mg total) by mouth every 4 (four) hours as needed for moderate pain. 01/31/16   Erin R Barrett, PA-C   BP 116/59 mmHg  Pulse 90  Temp(Src) 98.7 F (37.1 C)  Resp 26  SpO2 95% Physical Exam  Constitutional: She is oriented to person, place, and time. She appears well-developed and well-nourished. No distress.  HENT:  Head: Normocephalic and atraumatic.  Mouth/Throat: Oropharynx is clear and moist.  Eyes: Conjunctivae and EOM are normal. Pupils are equal, round, and reactive to light.  Neck: Normal range of motion. Neck supple.  Cardiovascular: Normal rate, regular rhythm and intact distal pulses.   No murmur heard. Pulmonary/Chest: Effort normal and breath sounds normal. No respiratory distress. She has no wheezes. She has no rales. She exhibits no tenderness.  No rashes. Sternotomy scar healing well without any erythema or drainage  Abdominal: Soft. She exhibits no distension. There is no tenderness. There is CVA tenderness. There is no rebound and no guarding.  Mild left CVA tenderness  Musculoskeletal: Normal range of motion. She exhibits no edema or tenderness.  Neurological: She is alert and oriented to person, place, and time.  Skin: Skin is  warm and dry. No rash noted. No erythema.  Psychiatric: She has a normal mood and affect. Her behavior is normal.  Nursing note and vitals reviewed.   ED Course  Procedures (including critical care time) Labs Review Labs Reviewed  BASIC METABOLIC PANEL - Abnormal; Notable for the following:    Glucose, Bld 122 (*)    Creatinine, Ser 1.16 (*)    GFR calc non Af Amer 44 (*)    GFR calc Af Amer 51 (*)    All other components within normal limits  CBC - Abnormal; Notable for the following:    WBC 11.0 (*)    Hemoglobin 11.1 (*)    RDW 17.6 (*)    All other components within normal limits  URINALYSIS, ROUTINE W REFLEX MICROSCOPIC (NOT AT [facility]) - Abnormal; Notable for the following:    Color, Urine AMBER (*)    APPearance TURBID (*)    Hgb urine dipstick MODERATE (*)    Bilirubin Urine SMALL (*)    Protein, ur 30 (*)    Leukocytes, UA LARGE (*)    All other components within normal limits  URINE MICROSCOPIC-ADD ON - Abnormal; Notable for the following:    Squamous Epithelial / LPF 6-30 (*)    Bacteria, UA MANY (*)    All other components within normal limits  URINE CULTURE  I-STAT TROPOININ, ED    Imaging Review Dg Chest 2 View  02/21/2016  CLINICAL DATA:  RIGHT-sided chest pain.  History of lung cancer EXAM: CHEST  2 VIEW COMPARISON:  Chest radiograph 01/26/2016, 01/25/2016 FINDINGS: Sternotomy wires overlie normal cardiac silhouette. There is chronic scarring and atelectasis at the LEFT lung base. Mild blunting of the RIGHT costophrenic angle suggests small effusion. No infiltrate or edema evident. No pneumothorax. IMPRESSION: 1. Bilateral pleural effusions.  The RIGHT effusion appears new. 2. Chronic atelectasis and scarring at the LEFT lung base Electronically Signed   By: Suzy Bouchard M.D.   On: 02/21/2016 17:36   I have personally reviewed and evaluated these images and lab results as part of my medical decision-making.   EKG Interpretation   Date/Time:  Friday February 21 2016 16:35:20 EDT Ventricular Rate:  100 PR Interval:  160 QRS Duration: 94 QT Interval:  342 QTC Calculation: 441 R Axis:   -24 Text Interpretation:  Normal sinus rhythm Nonspecific ST and T wave  abnormality No significant change since last tracing Confirmed by Maryan Rued   MD, Loree Fee ([id number]) on 02/21/2016 6:23:31 PM      MDM  Final diagnoses:  None   patient is a 76 year old female with a significant history for recent CABG surgery approximately 4 weeks ago presenting today with a one-week history of worsening left-sided pain, ongoing nausea and intermittent fevers. Has also noticed her urine is foul-smelling and she has no appetite.  Patient was seen by cardiology last week for fevers and not feeling well. She at that time was found to have a leukocytosis of 17,000 but states that she started to feel better.  She currently denies any peripheral edema or significant shortness of breath. No new cough or URI symptoms. She denies any abdominal pain.  On exam patient's vital signs are within normal limits. Surgical incisions appear to be healing well. No evidence of shingles. Pain is not reproducible with palpation in the chest she does have some mild left flank pain. Breath sounds are clear bilaterally.  Patient did have some hematuria while hospitalized which was thought to be due to heparin. She denies any history of kidney stones.  Concern for possible kidney stone versus pyelonephritis versus PE versus other chest pathology. Patient denies any unilateral leg pain or swelling.  Today CBC shows improvement in white blood cell count 11,000 from 17,000, normal troponin, BMP with improvement of creatinine to 1.16 in otherwise normal. Chest x-ray showing bilateral pleural effusions which are small in nature without other acute findings. EKG without acute findings. UA today consistent with urinary tract infection with too numerous to count white blood cells. Also large amount of red cells. It  could be that this is pyelonephritis however will do a CT to ensure no significant hydronephrosis or obstruction of any kind. Patient was given a dose of IV Rocephin and IV fluids.  9:45 PM CT without signs of stones or obstruction.  Will treat with keflex and d/c home.  Blanchie Dessert, MD 02/21/16 2145

## 2016-02-21 NOTE — Discharge Instructions (Signed)

## 2016-02-21 NOTE — ED Notes (Signed)
Pt here for left sided flank pain, rib pain and sts the pain radiates into her neck. sts it is intermittent and has been going on since her open heart surgery. sts she hasn't been eating well and her urine is dark.

## 2016-02-24 LAB — URINE CULTURE: Culture: >=100000

## 2016-02-25 ENCOUNTER — Telehealth: Payer: Self-pay

## 2016-02-25 ENCOUNTER — Other Ambulatory Visit: Payer: Self-pay | Admitting: Surgery

## 2016-02-25 DIAGNOSIS — Z951 Presence of aortocoronary bypass graft: Secondary | ICD-10-CM

## 2016-02-25 NOTE — Progress Notes (Signed)
ED Antimicrobial Stewardship Positive Culture Follow Up   Jessica Taylor is an 75 y.o. female who presented to [facility] on 02/21/2016 with a chief complaint of  Chief Complaint  Patient presents with  . Flank Pain    Recent Results (from the past 720 hour(s))  Urine culture     Status: None   Collection Time: 02/21/16  8:08 PM  Result Value Ref Range Status   Specimen Description URINE, CLEAN CATCH  Final   Special Requests NONE  Final   Culture >=100,000 COLONIES/mL PSEUDOMONAS AERUGINOSA  Final   Report Status 02/24/2016 FINAL  Final   Organism ID, Bacteria PSEUDOMONAS AERUGINOSA  Final      Susceptibility   Pseudomonas aeruginosa - MIC*    CEFTAZIDIME 4 SENSITIVE Sensitive     CIPROFLOXACIN <=0.25 SENSITIVE Sensitive     GENTAMICIN <=1 SENSITIVE Sensitive     IMIPENEM 2 SENSITIVE Sensitive     PIP/TAZO <=4 SENSITIVE Sensitive     CEFEPIME 4 SENSITIVE Sensitive     * >=100,000 COLONIES/mL PSEUDOMONAS AERUGINOSA    [x]  Treated with cephalexin, organism resistant to prescribed antimicrobial  New antibiotic prescription: Ciprofloxacin 250mg  PO BID x 7 days.   ED Provider: Delrae Rend, PA-C    [place]  02/25/2016, 9:22 AM Infectious Diseases Pharmacist Phone# [phone]

## 2016-02-25 NOTE — Telephone Encounter (Signed)
Left voice message regarding culture report and need for medication. Called both cell and home phone.

## 2016-02-25 NOTE — Telephone Encounter (Signed)
Post ED Visit - Positive Culture Follow-up: Successful Patient Follow-Up  Culture assessed and recommendations reviewed by: []  Elenor Quinones, Pharm.D. []  Heide Guile, Pharm.D., BCPS []  Parks Neptune, Pharm.D. []  Alycia Rossetti, Pharm.D., BCPS []  [place], Pharm.D., BCPS, AAHIVP []  Legrand Como, Pharm.D., BCPS, AAHIVP []  Milus Glazier, Pharm.D. []  Stephens November, Pharm.D.  Positive Urine culture  []  Patient discharged without antimicrobial prescription and treatment is now indicated [x]  Organism is resistant to prescribed ED discharge antimicrobial []  Patient with positive blood cultures Meagan Luanna Salk D Changes discussed with ED provider: Sam,Serena New antibiotic prescription  Cipro 250 mg Po q12 hr x one week Discontinue previous prescription Called to [place]  Contacted patient, date 02/25/2016, time 1534   Genia Del 02/25/2016, 3:33 PM

## 2016-02-26 ENCOUNTER — Ambulatory Visit: Payer: Self-pay | Admitting: Surgery

## 2016-02-27 ENCOUNTER — Other Ambulatory Visit: Payer: Self-pay | Admitting: *Deleted

## 2016-02-27 DIAGNOSIS — Z951 Presence of aortocoronary bypass graft: Secondary | ICD-10-CM

## 2016-03-03 ENCOUNTER — Ambulatory Visit: Payer: Medicare Other | Admitting: Cardiology

## 2016-03-09 ENCOUNTER — Encounter: Payer: Self-pay | Admitting: Physician Assistant

## 2016-03-09 ENCOUNTER — Ambulatory Visit
Admission: RE | Admit: 2016-03-09 | Discharge: 2016-03-09 | Disposition: A | Discharge status: Home / Self Care | Payer: Medicare Other | Source: Ambulatory Visit | Attending: Surgery | Admitting: Surgery

## 2016-03-09 ENCOUNTER — Ambulatory Visit (INDEPENDENT_AMBULATORY_CARE_PROVIDER_SITE_OTHER): Payer: Self-pay | Admitting: Physician Assistant

## 2016-03-09 VITALS — BP 116/77 | HR 107 | Resp 16 | Ht 63.0 in | Wt 154.8 lb

## 2016-03-09 DIAGNOSIS — Z951 Presence of aortocoronary bypass graft: Secondary | ICD-10-CM

## 2016-03-09 DIAGNOSIS — I2510 Atherosclerotic heart disease of native coronary artery without angina pectoris: Secondary | ICD-10-CM

## 2016-03-09 NOTE — Progress Notes (Addendum)
  HPI: Patient returns for routine postoperative follow-up having undergone a CABG x 3 on 01/24/2016 by Dr. Cyndia Bent. Since hospital discharge the patient reports she was seen in the ER on 02/21/2016 for nausea, dark urine and left sided pain. She was found to have leukocytosis and an etiology of UTI. Initially, she was prescribed Keftlex;however, subsequent culture revealed Pseudomonas Aeruginosa and she was prescribed Cipro. She has finished the antibiotic and her symptoms have improved. She denies chest pain, shortness of breath, fever, or chills.   Current Outpatient Prescriptions  Medication Sig Dispense Refill  . aspirin EC 81 MG tablet Take 81 mg by mouth daily.     Marland Kitchen atorvastatin (LIPITOR) 40 MG tablet Take 1 tablet (40 mg total) by mouth daily. 30 tablet 11  . cephALEXin (KEFLEX) 250 MG capsule Take 1 capsule (250 mg total) by mouth 4 (four) times daily. 28 capsule 0  . clopidogrel (PLAVIX) 75 MG tablet Take 1 tablet (75 mg total) by mouth daily. 30 tablet 11  . esomeprazole (NEXIUM) 40 MG capsule TAKE 1 CAPSULE BY MOUTH DAILY AT NOON. 30 capsule 1  . levothyroxine (SYNTHROID, LEVOTHROID) 50 MCG tablet Take 50 mcg by mouth daily before breakfast.    . metoprolol tartrate (LOPRESSOR) 25 MG tablet Take 0.5 tablets (12.5 mg total) by mouth 2 (two) times daily. 60 tablet 3  . promethazine (PHENERGAN) 25 MG suppository Place 1 suppository (25 mg total) rectally every 6 (six) hours as needed for nausea or vomiting. (Patient not taking: Reported on 02/21/2016) 10 each 0  . traMADol (ULTRAM) 50 MG tablet Take 1 tablet (50 mg total) by mouth every 6 (six) hours as needed. 15 tablet 0   Current Facility-Administered Medications  Medication Dose Route Frequency Provider Last Rate Last Dose  . diphenhydrAMINE (BENADRYL) injection 25 mg  25 mg Intravenous Once Lorretta Harp, MD      . famotidine (PEPCID) 20 mg in sodium chloride 0.9 % 50 mL IVPB  20 mg Intravenous Once Lorretta Harp, MD       Vital Signs: BP 116/77, HR 107 , Oxygen saturation 97 % on room air  Physical Exam: CV- Tachycardic Abdomen-Soft, non tender Extremities-No cyanosis, clubbing, or edema Wounds-Clean and dry  Diagnostic Tests: PA/LAT CXR: Small bilateral pleural effusions, minimal atelectasis, no pneumothorax.  Impression and Plan: She has already seen [facility] in follow up on 02/14/2016. There were no changes made to her medications. She states she wishes to see Dr. Marlou Porch and is going to call for a follow up appointment. She was wondering if her WBC count decreased after she was treated for her UTI. She is going to call her primary care physician, Dr. Moreen Fowler to get a CBC. I instructed her to increase her Lopressor from 12.5 mg bid to 25 mg bid. She states she does not need a new prescription for this.She is not taking narcotics for pain. She was instructed she may begin driving short distances (i.e. 30 minutes or less during the day) and increase her frequency and duration as tolerates. She was encouraged to attend cardiac rehab. Finally, she was instructed to continue sternal precautions (i.e. No lifting more than 10 pounds for 2 -3 more weeks). Per her request, she will follow up with Dr. Cyndia Bent as needed.    Nani Skillern, PA-C [facility] [phone]

## 2016-03-16 ENCOUNTER — Other Ambulatory Visit: Payer: Self-pay | Admitting: Cardiology

## 2016-03-24 ENCOUNTER — Ambulatory Visit (INDEPENDENT_AMBULATORY_CARE_PROVIDER_SITE_OTHER): Payer: Medicare Other | Admitting: Cardiology

## 2016-03-24 ENCOUNTER — Encounter: Payer: Self-pay | Admitting: Cardiology

## 2016-03-24 VITALS — BP 122/82 | HR 82 | Ht 63.0 in | Wt 156.8 lb

## 2016-03-24 DIAGNOSIS — I10 Essential (primary) hypertension: Secondary | ICD-10-CM | POA: Diagnosis not present

## 2016-03-24 DIAGNOSIS — Z951 Presence of aortocoronary bypass graft: Secondary | ICD-10-CM | POA: Diagnosis not present

## 2016-03-24 DIAGNOSIS — I2583 Coronary atherosclerosis due to lipid rich plaque: Secondary | ICD-10-CM

## 2016-03-24 DIAGNOSIS — I2510 Atherosclerotic heart disease of native coronary artery without angina pectoris: Secondary | ICD-10-CM | POA: Diagnosis not present

## 2016-03-24 DIAGNOSIS — I739 Peripheral vascular disease, unspecified: Secondary | ICD-10-CM | POA: Diagnosis not present

## 2016-03-24 MED ORDER — METOPROLOL TARTRATE 25 MG PO TABS: 25.0000 mg | ORAL_TABLET | Freq: Two times a day (BID) | ORAL | Status: DC

## 2016-03-24 NOTE — Patient Instructions (Signed)
Medication Instructions:  The current medical regimen is effective;  continue present plan and medications.  Follow-Up: Follow up in 3 months with Dr Skains.  If you need a refill on your cardiac medications before your next appointment, please call your pharmacy.  Thank you for choosing [facility]!!     

## 2016-03-24 NOTE — Progress Notes (Signed)
Cardiology Office Note    Date:  03/24/2016   ID:  Jessica Taylor, DOB 1941/04/27, MRN [id number]  PCP:  [name], MD  Cardiologist:   Candee Furbish, MD     History of Present Illness:  Jessica Taylor is a 75 y.o. female with coronary artery disease status post CABG 3 LIMA to LAD, SVG to ramus, SVG to PDA on 01/24/16. Previous PCI to LAD and RCA as well as PCI of occluded left circumflex after myocardial infarction in 02/2015 with recurrent anginal symptoms. She underwent bypass surgery. She had one visit to the emergency room on 02/21/16 secondary to dark urine and was diagnosed with pseudomonas aeruginosa, prescribed Cipro.  Overall currently she is doing well. No anginal symptoms, no shortness of breath. No significant lower extremity edema. After her urinary tract infection, she is now feeling much better. She was profoundly weak at that time.  She has also had bilateral iliac PCI and stenting on 06/2015 by Dr. Gwenlyn Found. She has abdominal aortic aneurysm and stage III chronic kidney disease as well as hyper lipidemia. She had been on Plavix because of non-ST elevation MI (troponin peaked at 41 on 03/06/15 prior to her bypass surgery) and peripheral vascular disease.  Her chest is feeling better now. She had some discomfort on her bilateral sides/flanks but this has resolved. She believes that this was secondary to her small pleural effusion that Lars Pinks related to her. She was very impressed.  Past Medical History  Diagnosis Date  . Acid reflux   . Hypothyroidism   . MI, old 89  . Hypercholesteremia   . Vertigo     when lays on left side.  . Cancer ([place])     basal cell on nose  . Anemia   . Coronary artery disease   . Hypertension   . Arthritis     knees , R shoulder - tx /w injection - 11/2014  . Tobacco abuse   . Abdominal aortic aneurysm ([place])   . Peripheral arterial disease (HCC)     hhigh-grade ostial bilateral calcified iliac stenosis with claudication     Past Surgical History  Procedure Laterality Date  . Coronary stent placement    . Cardiac catheterization  5 stents  . Tubal ligation    . Cataract extraction w/phaco Left 05/24/2014    Procedure: CATARACT EXTRACTION PHACO AND INTRAOCULAR LENS PLACEMENT (IOC);  Surgeon: Tonny Branch, MD;  Location: [facility];  Service: Ophthalmology;  Laterality: Left;  CDE:  9.30  . Cataract extraction w/phaco Right 06/18/2014    Procedure: CATARACT EXTRACTION PHACO AND INTRAOCULAR LENS PLACEMENT RIGHT EYE CDE=10.84;  Surgeon: Tonny Branch, MD;  Location: [facility];  Service: Ophthalmology;  Laterality: Right;  . Eye surgery    . Tonsillectomy      age 15  . Partial knee arthroplasty Right 02/04/2015    Procedure: UNICOMPARTMENTAL KNEE;  Surgeon: Dorna Leitz, MD;  Location: [facility];  Service: Orthopedics;  Laterality: Right;  . Left heart catheterization with coronary angiogram N/A 03/06/2015    Procedure: LEFT HEART CATHETERIZATION WITH CORONARY ANGIOGRAM;  Surgeon: Lorretta Harp, MD;  Location: [facility] C[facility];  Service: Cardiovascular;  Laterality: N/A;  . Coronary stent placement  03/06/15    CFX DES  . Peripheral vascular catheterization Bilateral 05/13/2015    Procedure: Lower Extremity Angiography;  Surgeon: Lorretta Harp, MD;  Location: [facility];  Service: Cardiovascular;  Laterality: Bilateral;  . Peripheral vascular catheterization N/A 05/13/2015  Procedure: Abdominal Aortogram;  Surgeon: Lorretta Harp, MD;  Location: [facility];  Service: Cardiovascular;  Laterality: N/A;  . Peripheral vascular catheterization Bilateral 06/27/2015    Procedure: Peripheral Vascular Intervention;  Surgeon: Lorretta Harp, MD;  Location: [place] [facility];  Service: Cardiovascular;  Laterality: Bilateral;  ILIACS  . Peripheral vascular catheterization Bilateral 06/27/2015    Procedure: Peripheral Vascular Atherectomy;  Surgeon: Lorretta Harp, MD;  Location: [place] CV LAB;  Service: Cardiovascular;   Laterality: Bilateral;  . Cardiac catheterization N/A 01/20/2016    Procedure: Left Heart Cath and Coronary Angiography;  Surgeon: Peter M Martinique, MD;  Location: [place] [facility];  Service: Cardiovascular;  Laterality: N/A;  . Coronary artery bypass graft N/A 01/24/2016    Procedure: CORONARY ARTERY BYPASS GRAFTING (CABG);  Surgeon: Gaye Pollack, MD;  Location: [place] [facility];  Service: Open Heart Surgery;  Laterality: N/A;  . Tee without cardioversion N/A 01/24/2016    Procedure: TRANSESOPHAGEAL ECHOCARDIOGRAM (TEE);  Surgeon: Gaye Pollack, MD;  Location: [place];  Service: Open Heart Surgery;  Laterality: N/A;    Current Medications: Outpatient Prescriptions Prior to Visit  Medication Sig Dispense Refill  . aspirin EC 81 MG tablet Take 81 mg by mouth daily.     Marland Kitchen atorvastatin (LIPITOR) 40 MG tablet Take 1 tablet (40 mg total) by mouth daily. 30 tablet 11  . clopidogrel (PLAVIX) 75 MG tablet Take 1 tablet (75 mg total) by mouth daily. 30 tablet 11  . esomeprazole (NEXIUM) 40 MG capsule TAKE 1 CAPSULE BY MOUTH DAILY AT NOON. 30 capsule 0  . levothyroxine (SYNTHROID, LEVOTHROID) 50 MCG tablet Take 50 mcg by mouth daily before breakfast.    . metoprolol tartrate (LOPRESSOR) 25 MG tablet Take 0.5 tablets (12.5 mg total) by mouth 2 (two) times daily. 60 tablet 3  . promethazine (PHENERGAN) 25 MG suppository Place 1 suppository (25 mg total) rectally every 6 (six) hours as needed for nausea or vomiting. (Patient not taking: Reported on 02/21/2016) 10 each 0  . traMADol (ULTRAM) 50 MG tablet Take 1 tablet (50 mg total) by mouth every 6 (six) hours as needed. 15 tablet 0   Facility-Administered Medications Prior to Visit  Medication Dose Route Frequency Provider Last Rate Last Dose  . diphenhydrAMINE (BENADRYL) injection 25 mg  25 mg Intravenous Once Lorretta Harp, MD      . famotidine (PEPCID) 20 mg in sodium chloride 0.9 % 50 mL IVPB  20 mg Intravenous Once Lorretta Harp, MD         Allergies:    Shrimp   Social History   Social History  . Marital Status: Married    Spouse Name: N/A  . Number of Children: N/A  . Years of Education: N/A   Social History Main Topics  . Smoking status: Former Smoker -- 1.50 packs/day for 42 years    Types: Cigarettes    Quit date: 05/19/1999  . Smokeless tobacco: Never Used  . Alcohol Use: No  . Drug Use: No  . Sexual Activity: Yes    Birth Control/ Protection: Surgical   Other Topics Concern  . None   Social History Narrative     Family History:  The patient's family history includes Cancer (age of onset: 71) in her father; Cancer (age of onset: 88) in her mother; Hypertension in her maternal grandmother and son; Stroke in her maternal grandfather. There is no history of Heart attack.   ROS:  Please see the history of present illness.   Osteoarthritis of her knees ROS All other systems reviewed and are negative.   PHYSICAL EXAM:   VS:  BP 122/82 mmHg  Pulse 82  Ht 5\' 3"  (1.6 m)  Wt 156 lb 12.8 oz (71.124 kg)  BMI 27.78 kg/m2   GEN: Well nourished, well developed, in no acute distress HEENT: normal Neck: no JVD, carotid bruits, or masses Cardiac: RRR; no murmurs, rubs, or gallops,no edema scar noted Respiratory:  clear to auscultation bilaterally, normal work of breathing GI: soft, nontender, nondistended, + BS MS: no deformity or atrophy Skin: warm and dry, no rash Neuro:  Alert and Oriented x 3, Strength and sensation are intact Psych: euthymic mood, full affect  Wt Readings from Last 3 Encounters:  03/24/16 156 lb 12.8 oz (71.124 kg)  03/09/16 154 lb 12.8 oz (70.217 kg)  02/14/16 157 lb 6.4 oz (71.396 kg)      Studies/Labs Reviewed:   EKG:  EKG is not ordered today.    Recent Labs: 01/25/2016: Magnesium 3.1* 02/21/2016: BUN 15; Creatinine, Ser 1.16*; Hemoglobin 11.1*; Platelets 336; Potassium 4.0; Sodium 139   Lipid Panel    Component Value Date/Time   CHOL 177 01/17/2016 0306   TRIG 156* 01/17/2016 0306    HDL 36* 01/17/2016 0306   CHOLHDL 4.9 01/17/2016 0306   VLDL 31 01/17/2016 0306   LDLCALC 110* 01/17/2016 0306    Additional studies/ records that were reviewed today include:  Prior hospital records, lab work, catheterization reviewed    ASSESSMENT:    1. S/P CABG (coronary artery bypass graft)   2. Coronary artery disease due to lipid rich plaque   3. Essential hypertension   4. PAD (peripheral artery disease) (HCC)      PLAN:  In order of problems listed above:  Coronary artery disease status post bypass surgery 02/2015 -LIMA to LAD, SVG to ramus, SVG to PDA-Dr. Cyndia Bent -Overall doing very well. Pleased. No anginal symptoms.  Non-ST elevation myocardial infarction -On both Plavix as well as aspirin. We will continue the Plavix at least 1 year. We may consider this longer for her peripheral vascular disease.  Hyperlipidemia -Continuing with atorvastatin 40 mg once a day. Last LDL 110 and February 2017.  AAA -Small will monitor periodically.  Chronic kidney disease stage III -Creatinine in the 1-1.4 range.      Medication Adjustments/Labs and Tests Ordered: Current medicines are reviewed at length with the patient today.  Concerns regarding medicines are outlined above.  Medication changes, Labs and Tests ordered today are listed in the Patient Instructions below. Patient Instructions  Medication Instructions:  The current medical regimen is effective;  continue present plan and medications.  Follow-Up: Follow up in 3 months with Dr Marlou Porch.  If you need a refill on your cardiac medications before your next appointment, please call your pharmacy.  Thank you for choosing [facility]!!           Signed, Candee Furbish, MD  03/24/2016 4:38 PM    [facility] [place], [place], [place]  [address] Phone: [phone]; Fax: [phone]

## 2016-03-25 ENCOUNTER — Telehealth (HOSPITAL_BASED_OUTPATIENT_CLINIC_OR_DEPARTMENT_OTHER): Payer: Self-pay | Admitting: Emergency Medicine

## 2016-03-25 NOTE — Telephone Encounter (Signed)
Patient lost to followup, no response from letter

## 2016-04-01 ENCOUNTER — Other Ambulatory Visit: Payer: Self-pay | Admitting: *Deleted

## 2016-04-01 MED ORDER — ATORVASTATIN CALCIUM 40 MG PO TABS: 40.0000 mg | ORAL_TABLET | Freq: Every day | ORAL | Status: DC

## 2016-04-29 ENCOUNTER — Other Ambulatory Visit: Payer: Self-pay | Admitting: Cardiology

## 2016-06-03 ENCOUNTER — Encounter: Payer: Self-pay | Admitting: Cardiology

## 2016-06-15 ENCOUNTER — Encounter: Payer: Self-pay | Admitting: Cardiology

## 2016-07-02 ENCOUNTER — Ambulatory Visit: Payer: Medicare Other | Admitting: Cardiology

## 2016-07-08 ENCOUNTER — Other Ambulatory Visit: Payer: Self-pay | Admitting: Cardiology

## 2016-07-13 ENCOUNTER — Ambulatory Visit (INDEPENDENT_AMBULATORY_CARE_PROVIDER_SITE_OTHER): Payer: Medicare Other | Admitting: Cardiology

## 2016-07-13 ENCOUNTER — Encounter: Payer: Self-pay | Admitting: Cardiology

## 2016-07-13 ENCOUNTER — Encounter (INDEPENDENT_AMBULATORY_CARE_PROVIDER_SITE_OTHER): Payer: Self-pay

## 2016-07-13 VITALS — BP 138/84 | HR 80 | Ht 63.0 in | Wt 161.8 lb

## 2016-07-13 DIAGNOSIS — I739 Peripheral vascular disease, unspecified: Secondary | ICD-10-CM | POA: Diagnosis not present

## 2016-07-13 DIAGNOSIS — Z79899 Other long term (current) drug therapy: Secondary | ICD-10-CM

## 2016-07-13 DIAGNOSIS — I10 Essential (primary) hypertension: Secondary | ICD-10-CM | POA: Diagnosis not present

## 2016-07-13 DIAGNOSIS — E7800 Pure hypercholesterolemia, unspecified: Secondary | ICD-10-CM

## 2016-07-13 DIAGNOSIS — I2510 Atherosclerotic heart disease of native coronary artery without angina pectoris: Secondary | ICD-10-CM | POA: Diagnosis not present

## 2016-07-13 DIAGNOSIS — Z951 Presence of aortocoronary bypass graft: Secondary | ICD-10-CM

## 2016-07-13 DIAGNOSIS — I2583 Coronary atherosclerosis due to lipid rich plaque: Principal | ICD-10-CM

## 2016-07-13 LAB — LIPID PANEL
CHOL/HDL RATIO: 4.9 ratio (ref <=5.0)
Cholesterol: 188 mg/dL (ref 125–200)
HDL: 38 mg/dL — AB (ref >=46)
LDL CALC: 98 mg/dL (ref <130)
TRIGLYCERIDES: 259 mg/dL — AB (ref <150)
VLDL: 52 mg/dL — AB (ref <30)

## 2016-07-13 LAB — ALT: ALT: 14 U/L (ref 6–29)

## 2016-07-13 NOTE — Patient Instructions (Signed)
Medication Instructions:  The current medical regimen is effective;  continue present plan and medications.  Labwork: Please have blood work today (Lipid/ALT)  Follow-Up: Follow up in 1 year with Dr. Marlou Porch.  You will receive a letter in the mail 2 months before you are due.  Please call us when you receive this letter to schedule your follow up appointment.  If you need a refill on your cardiac medications before your next appointment, please call your pharmacy.  Thank you for choosing [facility]!!

## 2016-07-13 NOTE — Progress Notes (Signed)
Cardiology Office Note    Date:  07/13/2016   ID:  Jessica Taylor, Jessica Taylor 1941/06/08, MRN [id number]  PCP:  [name], MD  Cardiologist:   Candee Furbish, MD     History of Present Illness:  Jessica Taylor is a 75 y.o. female with coronary artery disease status post CABG 3 LIMA to LAD, SVG to ramus, SVG to PDA on 01/24/16. Previous PCI to LAD and RCA as well as PCI of occluded left circumflex after myocardial infarction in 02/2015 with recurrent anginal symptoms. She underwent bypass surgery. She had one visit to the emergency room on 02/21/16 secondary to dark urine and was diagnosed with pseudomonas aeruginosa, prescribed Cipro.  Overall currently she is doing well. No anginal symptoms, no shortness of breath. No significant lower extremity edema.   She has also had bilateral iliac PCI and stenting on 06/2015 by Dr. Gwenlyn Found. Stage III chronic kidney disease as well as hyper lipidemia. She had been on Plavix because of non-ST elevation MI (troponin peaked at 41 on 03/06/15 prior to her bypass surgery) and peripheral vascular disease.  Her chest is feeling better now. She had some discomfort on her bilateral sides/flanks but this has resolved. Very rarely she will feel a twinge in her chest when she sits up from the bed. This is to be expected. She believes that this was secondary to her small pleural effusion that Lars Pinks related to her. She was very impressed.    Past Medical History:  Diagnosis Date  . Abdominal aortic aneurysm ([place])   . Acid reflux   . Anemia   . Arthritis    knees , R shoulder - tx /w injection - 11/2014  . Cancer ([place])    basal cell on nose  . Coronary artery disease   . Hypercholesteremia   . Hypertension   . Hypothyroidism   . MI, old 31  . Peripheral arterial disease (HCC)    hhigh-grade ostial bilateral calcified iliac stenosis with claudication  . Tobacco abuse   . Vertigo    when lays on left side.    Past Surgical History:  Procedure  Laterality Date  . CARDIAC CATHETERIZATION  5 stents  . CARDIAC CATHETERIZATION N/A 01/20/2016   Procedure: Left Heart Cath and Coronary Angiography;  Surgeon: Peter M Martinique, MD;  Location: [place] [facility];  Service: Cardiovascular;  Laterality: N/A;  . CATARACT EXTRACTION W/PHACO Left 05/24/2014   Procedure: CATARACT EXTRACTION PHACO AND INTRAOCULAR LENS PLACEMENT (IOC);  Surgeon: Tonny Branch, MD;  Location: [facility];  Service: Ophthalmology;  Laterality: Left;  CDE:  9.30  . CATARACT EXTRACTION W/PHACO Right 06/18/2014   Procedure: CATARACT EXTRACTION PHACO AND INTRAOCULAR LENS PLACEMENT RIGHT EYE CDE=10.84;  Surgeon: Tonny Branch, MD;  Location: [facility];  Service: Ophthalmology;  Laterality: Right;  . CORONARY ARTERY BYPASS GRAFT N/A 01/24/2016   Procedure: CORONARY ARTERY BYPASS GRAFTING (CABG);  Surgeon: Gaye Pollack, MD;  Location: [facility];  Service: Open Heart Surgery;  Laterality: N/A;  . CORONARY STENT PLACEMENT    . CORONARY STENT PLACEMENT  03/06/15   CFX DES  . EYE SURGERY    . LEFT HEART CATHETERIZATION WITH CORONARY ANGIOGRAM N/A 03/06/2015   Procedure: LEFT HEART CATHETERIZATION WITH CORONARY ANGIOGRAM;  Surgeon: Lorretta Harp, MD;  Location: [facility] C[facility] LAB;  Service: Cardiovascular;  Laterality: N/A;  . PARTIAL KNEE ARTHROPLASTY Right 02/04/2015   Procedure: UNICOMPARTMENTAL KNEE;  Surgeon: Dorna Leitz, MD;  Location: [facility];  Service: Orthopedics;  Laterality: Right;  . PERIPHERAL VASCULAR CATHETERIZATION Bilateral 05/13/2015   Procedure: Lower Extremity Angiography;  Surgeon: Lorretta Harp, MD;  Location: [facility];  Service: Cardiovascular;  Laterality: Bilateral;  . PERIPHERAL VASCULAR CATHETERIZATION N/A 05/13/2015   Procedure: Abdominal Aortogram;  Surgeon: Lorretta Harp, MD;  Location: [place] [facility];  Service: Cardiovascular;  Laterality: N/A;  . PERIPHERAL VASCULAR CATHETERIZATION Bilateral 06/27/2015   Procedure: Peripheral Vascular Intervention;  Surgeon: Lorretta Harp, MD;  Location: [place] [facility];  Service: Cardiovascular;  Laterality: Bilateral;  ILIACS  . PERIPHERAL VASCULAR CATHETERIZATION Bilateral 06/27/2015   Procedure: Peripheral Vascular Atherectomy;  Surgeon: Lorretta Harp, MD;  Location: [place] [facility];  Service: Cardiovascular;  Laterality: Bilateral;  . TEE WITHOUT CARDIOVERSION N/A 01/24/2016   Procedure: TRANSESOPHAGEAL ECHOCARDIOGRAM (TEE);  Surgeon: Gaye Pollack, MD;  Location: [facility];  Service: Open Heart Surgery;  Laterality: N/A;  . TONSILLECTOMY     age 1  . TUBAL LIGATION      Current Medications: Outpatient Medications Prior to Visit  Medication Sig Dispense Refill  . aspirin EC 81 MG tablet Take 81 mg by mouth daily.     Marland Kitchen atorvastatin (LIPITOR) 40 MG tablet Take 1 tablet (40 mg total) by mouth daily. 90 tablet 3  . clopidogrel (PLAVIX) 75 MG tablet TAKE ONE TABLET BY MOUTH ONCE DAILY. 30 tablet 0  . esomeprazole (NEXIUM) 40 MG capsule TAKE 1 CAPSULE BY MOUTH DAILY AT NOON. 30 capsule 11  . levothyroxine (SYNTHROID, LEVOTHROID) 50 MCG tablet Take 50 mcg by mouth daily before breakfast.    . metoprolol tartrate (LOPRESSOR) 25 MG tablet Take 1 tablet (25 mg total) by mouth 2 (two) times daily. 180 tablet 3   Facility-Administered Medications Prior to Visit  Medication Dose Route Frequency Provider Last Rate Last Dose  . diphenhydrAMINE (BENADRYL) injection 25 mg  25 mg Intravenous Once Lorretta Harp, MD      . famotidine (PEPCID) 20 mg in sodium chloride 0.9 % 50 mL IVPB  20 mg Intravenous Once Lorretta Harp, MD         Allergies:   Shrimp Dia Sitter allergy]   Social History   Social History  . Marital status: Married    Spouse name: N/A  . Number of children: N/A  . Years of education: N/A   Social History Main Topics  . Smoking status: Former Smoker    Packs/day: 1.50    Years: 42.00    Types: Cigarettes    Quit date: 05/19/1999  . Smokeless tobacco: Never Used  . Alcohol use No  . Drug  use: No  . Sexual activity: Yes    Birth control/ protection: Surgical   Other Topics Concern  . None   Social History Narrative  . None     Family History:  The patient's family history includes Cancer (age of onset: 50) in her father; Cancer (age of onset: 17) in her mother; Hypertension in her maternal grandmother and son; Stroke in her maternal grandfather.   ROS:   Please see the history of present illness.   Osteoarthritis of her knees ROS All other systems reviewed and are negative.   PHYSICAL EXAM:   VS:  BP 138/84   Pulse 80   Ht 5\' 3"  (1.6 m)   Wt 161 lb 12.8 oz (73.4 kg)   BMI 28.66 kg/m    GEN: Well nourished, well developed, in no acute distress  HEENT: normal  Neck: no JVD, carotid bruits, or masses Cardiac: RRR; no murmurs, rubs, or gallops,no edema scar noted Respiratory:  clear to auscultation bilaterally, normal work of breathing GI: soft, nontender, nondistended, + BS MS: no deformity or atrophy  Skin: warm and dry, no rash Neuro:  Alert and Oriented x 3, Strength and sensation are intact Psych: euthymic mood, full affect  Wt Readings from Last 3 Encounters:  07/13/16 161 lb 12.8 oz (73.4 kg)  03/24/16 156 lb 12.8 oz (71.1 kg)  03/09/16 154 lb 12.8 oz (70.2 kg)      Studies/Labs Reviewed:   EKG:  EKG is not ordered today.    Recent Labs: 01/25/2016: Magnesium 3.1 02/21/2016: BUN 15; Creatinine, Ser 1.16; Hemoglobin 11.1; Platelets 336; Potassium 4.0; Sodium 139   Lipid Panel    Component Value Date/Time   CHOL 177 01/17/2016 0306   TRIG 156 (H) 01/17/2016 0306   HDL 36 (L) 01/17/2016 0306   CHOLHDL 4.9 01/17/2016 0306   VLDL 31 01/17/2016 0306   LDLCALC 110 (H) 01/17/2016 0306    Additional studies/ records that were reviewed today include:  Prior hospital records, lab work, catheterization reviewed    ASSESSMENT:    1. Coronary artery disease due to lipid rich plaque   2. S/P CABG (coronary artery bypass graft)   3. PAD  (peripheral artery disease) ([place])   4. Essential hypertension   5. Long-term use of high-risk medication   6. Hypercholesteremia      PLAN:  In order of problems listed above:  Coronary artery disease status post bypass surgery 02/2015 -LIMA to LAD, SVG to ramus, SVG to PDA-Dr. Cyndia Bent -Overall doing very well. Pleased. No anginal symptoms.  Non-ST elevation myocardial infarction -On both Plavix as well as aspirin. We will continue the Plavix at least 1 year. We may consider this longer for her peripheral vascular disease, I.e. No ASA just Plavix only.  Hyperlipidemia -Continuing with atorvastatin 40 mg once a day. Last LDL 110 and February 2017. Rechecking with ALT. No symptoms with medication.   PVD  - Prior bilateral iliac stenting. Dr. Gwenlyn Found.Great distal pulses.   Chronic kidney disease stage III -Creatinine in the 1-1.4 range.    Medication Adjustments/Labs and Tests Ordered: Current medicines are reviewed at length with the patient today.  Concerns regarding medicines are outlined above.  Medication changes, Labs and Tests ordered today are listed in the Patient Instructions below. Patient Instructions  Medication Instructions:  The current medical regimen is effective;  continue present plan and medications.  Labwork: Please have blood work today (Lipid/ALT)  Follow-Up: Follow up in 1 year with Dr. Marlou Porch.  You will receive a letter in the mail 2 months before you are due.  Please call us when you receive this letter to schedule your follow up appointment.  If you need a refill on your cardiac medications before your next appointment, please call your pharmacy.  Thank you for choosing [facility]!!         Signed, Candee Furbish, MD  07/13/2016 10:45 AM    [facility] [place], [place], [place]  [address] Phone: [phone]; Fax: [phone]

## 2016-07-23 ENCOUNTER — Encounter: Payer: Self-pay | Admitting: *Deleted

## 2016-08-05 ENCOUNTER — Emergency Department (HOSPITAL_COMMUNITY)
Admission: EM | Admit: 2016-08-05 | Discharge: 2016-08-05 | Disposition: A | Discharge status: Home / Self Care | Payer: Medicare Other | Attending: Emergency Medicine | Admitting: Emergency Medicine

## 2016-08-05 ENCOUNTER — Encounter (HOSPITAL_COMMUNITY): Payer: Self-pay | Admitting: Emergency Medicine

## 2016-08-05 DIAGNOSIS — Z951 Presence of aortocoronary bypass graft: Secondary | ICD-10-CM | POA: Insufficient documentation

## 2016-08-05 DIAGNOSIS — I2510 Atherosclerotic heart disease of native coronary artery without angina pectoris: Secondary | ICD-10-CM | POA: Insufficient documentation

## 2016-08-05 DIAGNOSIS — I10 Essential (primary) hypertension: Secondary | ICD-10-CM | POA: Diagnosis not present

## 2016-08-05 DIAGNOSIS — R21 Rash and other nonspecific skin eruption: Secondary | ICD-10-CM | POA: Diagnosis present

## 2016-08-05 DIAGNOSIS — Z87891 Personal history of nicotine dependence: Secondary | ICD-10-CM | POA: Diagnosis not present

## 2016-08-05 DIAGNOSIS — I809 Phlebitis and thrombophlebitis of unspecified site: Secondary | ICD-10-CM

## 2016-08-05 DIAGNOSIS — E039 Hypothyroidism, unspecified: Secondary | ICD-10-CM | POA: Diagnosis not present

## 2016-08-05 DIAGNOSIS — Z7982 Long term (current) use of aspirin: Secondary | ICD-10-CM | POA: Insufficient documentation

## 2016-08-05 DIAGNOSIS — Z85828 Personal history of other malignant neoplasm of skin: Secondary | ICD-10-CM | POA: Insufficient documentation

## 2016-08-05 NOTE — ED Triage Notes (Signed)
Pt with reddened rash to L lower leg that started 2 days ago. Pt reports edema around the area. Pt denies itching but states the area is uncomfortable.

## 2016-08-05 NOTE — Discharge Instructions (Signed)
Please apply a warm compress to the left lower leg 2 times daily. Please use 400 mg of ibuprofen with breakfast, lunch, dinner, and at bedtime. Your examination favors thrombophlebitis. Please see your primary physician, or return to the emergency department immediately if the redness seems to move beyond the marked areas on your lower extremity. Also return if any high fever, excessive pain or swelling involving the lower extremity.

## 2016-08-05 NOTE — ED Provider Notes (Signed)
[facility] DEPT Provider Note   CSN: [id number] Arrival date & time: 08/05/16  1551     History   Chief Complaint Chief Complaint  Patient presents with  . Rash    HPI Jessica Taylor is a 75 y.o. female.  Patient is a 75 year old female who presents to the emergency department with a complaint of rash to the leg.  The patient states that she first noticed this rash about 2 days ago. She noticed it is reddened area on the inner aspect of her left lower leg/ankle. She denies any recent injury or trauma to the area. She states she has not been doing any more standing or walking than usual. She has not had on any socks or sock type devices that were tight. She's never had this problem before. She states that it does not hurt, but she noticed a rash and she was concerned about possible cellulitis. It is of note that the patient has had problems with peripheral vascular disease requiring stent placement in the arteries of her lower extremities. No recent fever, chills, nausea, or weakness.   The history is provided by the patient.  Rash      Past Medical History:  Diagnosis Date  . Abdominal aortic aneurysm ([place])   . Acid reflux   . Anemia   . Arthritis    knees , R shoulder - tx /w injection - 11/2014  . Cancer ([place])    basal cell on nose  . Coronary artery disease   . Hypercholesteremia   . Hypertension   . Hypothyroidism   . MI, old 84  . Peripheral arterial disease ([facility])    hhigh-grade ostial bilateral calcified iliac stenosis with claudication  . Tobacco abuse   . Vertigo    when lays on left side.    Patient Active Problem List   Diagnosis Date Noted  . 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 CAD (coronary artery disease)   . Coronary artery disease involving native coronary artery of native heart without angina pectoris   . Unstable angina ([place])   . Chest pain 01/17/2016  . Hematuria 01/17/2016  . Coronary artery disease involving native coronary artery of native  heart with unstable angina pectoris ([place])   . Claudication ([place]) 06/27/2015  . Acute renal insufficiency post PCI- SCr1.34 03/08/2015  . STEMI- urgent CFX PCI 03/06/15 03/06/2015  . 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 Hypokalemia 03/06/2015  . Hyperglycemia 03/06/2015  . PAD (peripheral artery disease) ([place]) 03/06/2015  . 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 Elevated troponin 02/10/2015  . GERD (gastroesophageal reflux disease) 02/08/2015  . CAD S/P prior PCI to LAD and RCA 02/08/2015  . Hypothyroidism 02/08/2015  . Hypoxia 02/08/2015  . Status post right partial knee replacement   . Primary osteoarthritis of right knee 02/04/2015  . 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 Atherosclerosis of native coronary artery of native heart without angina pectoris 06/06/2014  . Mixed hyperlipidemia 06/06/2014  . Essential hypertension 06/06/2014    Past Surgical History:  Procedure Laterality Date  . CARDIAC CATHETERIZATION  5 stents  . CARDIAC CATHETERIZATION N/A 01/20/2016   Procedure: Left Heart Cath and Coronary Angiography;  Surgeon: Peter M Martinique, MD;  Location: [facility];  Service: Cardiovascular;  Laterality: N/A;  . CATARACT EXTRACTION W/PHACO Left 05/24/2014   Procedure: CATARACT EXTRACTION PHACO AND INTRAOCULAR LENS PLACEMENT (IOC);  Surgeon: Tonny Branch, MD;  Location: [facility];  Service: Ophthalmology;  Laterality: Left;  CDE:  9.30  . CATARACT EXTRACTION W/PHACO Right 06/18/2014   Procedure: CATARACT EXTRACTION PHACO AND INTRAOCULAR LENS PLACEMENT RIGHT EYE CDE=10.84;  Surgeon: Tonny Branch, MD;  Location: [facility];  Service: Ophthalmology;  Laterality: Right;  . CORONARY ARTERY BYPASS GRAFT N/A 01/24/2016   Procedure: CORONARY ARTERY BYPASS GRAFTING (CABG);  Surgeon: Gaye Pollack, MD;  Location: [facility];  Service: Open Heart Surgery;  Laterality: N/A;  . CORONARY STENT PLACEMENT    . CORONARY STENT PLACEMENT  03/06/15   CFX DES  . EYE SURGERY    . LEFT  HEART CATHETERIZATION WITH CORONARY ANGIOGRAM N/A 03/06/2015   Procedure: LEFT HEART CATHETERIZATION WITH CORONARY ANGIOGRAM;  Surgeon: Lorretta Harp, MD;  Location: [facility];  Service: Cardiovascular;  Laterality: N/A;  . PARTIAL KNEE ARTHROPLASTY Right 02/04/2015   Procedure: UNICOMPARTMENTAL KNEE;  Surgeon: Dorna Leitz, MD;  Location: [facility];  Service: Orthopedics;  Laterality: Right;  . PERIPHERAL VASCULAR CATHETERIZATION Bilateral 05/13/2015   Procedure: Lower Extremity Angiography;  Surgeon: Lorretta Harp, MD;  Location: [facility];  Service: Cardiovascular;  Laterality: Bilateral;  . PERIPHERAL VASCULAR CATHETERIZATION N/A 05/13/2015   Procedure: Abdominal Aortogram;  Surgeon: Lorretta Harp, MD;  Location: [facility];  Service: Cardiovascular;  Laterality: N/A;  . PERIPHERAL VASCULAR CATHETERIZATION Bilateral 06/27/2015   Procedure: Peripheral Vascular Intervention;  Surgeon: Lorretta Harp, MD;  Location: [facility];  Service: Cardiovascular;  Laterality: Bilateral;  ILIACS  . PERIPHERAL VASCULAR CATHETERIZATION Bilateral 06/27/2015   Procedure: Peripheral Vascular Atherectomy;  Surgeon: Lorretta Harp, MD;  Location: [facility];  Service: Cardiovascular;  Laterality: Bilateral;  . TEE WITHOUT CARDIOVERSION N/A 01/24/2016   Procedure: TRANSESOPHAGEAL ECHOCARDIOGRAM (TEE);  Surgeon: Gaye Pollack, MD;  Location: [facility];  Service: Open Heart Surgery;  Laterality: N/A;  . TONSILLECTOMY     age 73  . TUBAL LIGATION      OB History    Gravida Para Term Preterm AB Living   4 4 4          SAB TAB Ectopic Multiple Live Births                   Home Medications    Prior to Admission medications   Medication Sig Start Date End Date Taking? Authorizing Provider  aspirin EC 81 MG tablet Take 81 mg by mouth daily.     Historical Provider, MD  atorvastatin (LIPITOR) 40 MG tablet Take 1 tablet (40 mg total) by mouth daily. 04/01/16   Jerline Pain, MD    clopidogrel (PLAVIX) 75 MG tablet TAKE ONE TABLET BY MOUTH ONCE DAILY. 07/08/16   Jerline Pain, MD  esomeprazole (NEXIUM) 40 MG capsule TAKE 1 CAPSULE BY MOUTH DAILY AT NOON. 04/29/16   Jerline Pain, MD  levothyroxine (SYNTHROID, LEVOTHROID) 50 MCG tablet Take 50 mcg by mouth daily before breakfast.    Historical Provider, MD  metoprolol tartrate (LOPRESSOR) 25 MG tablet Take 1 tablet (25 mg total) by mouth 2 (two) times daily. 03/24/16   Jerline Pain, MD    Family History Family History  Problem Relation Age of Onset  . Cancer Mother 64  . Cancer Father 49  . Hypertension Maternal Grandmother   . Stroke Maternal Grandfather   . Hypertension Son   . Heart attack Neg Hx     Social History Social History  Substance Use Topics  . Smoking  status: Former Smoker    Packs/day: 1.50    Years: 42.00    Types: Cigarettes    Quit date: 05/19/1999  . Smokeless tobacco: Never Used  . Alcohol use No     Allergies   Shrimp [shellfish allergy]   Review of Systems Review of Systems  Constitutional: Negative for chills and fever.  Gastrointestinal: Negative for nausea.  Skin: Positive for rash.  Neurological: Negative for weakness.  All other systems reviewed and are negative.    Physical Exam Updated Vital Signs BP 150/72 (BP Location: Left Arm)   Pulse 90   Temp 98 F (36.7 C) (Oral)   Resp 19   Ht 5\' 3"  (1.6 m)   Wt 72.1 kg   SpO2 98%   BMI 28.17 kg/m   Physical Exam  Constitutional: She is oriented to person, place, and time. She appears well-developed and well-nourished.  Non-toxic appearance.  HENT:  Head: Normocephalic.  Right Ear: Tympanic membrane and external ear normal.  Left Ear: Tympanic membrane and external ear normal.  Eyes: EOM and lids are normal. Pupils are equal, round, and reactive to light.  Neck: Normal range of motion. Neck supple. Carotid bruit is not present.  Cardiovascular: Normal rate, regular rhythm, normal heart sounds, intact distal pulses  and normal pulses.   Pulmonary/Chest: Breath sounds normal. No respiratory distress.  Abdominal: Soft. Bowel sounds are normal. There is no tenderness. There is no guarding.  Musculoskeletal: Normal range of motion.  There no temperature changes of the right and left lower extremity. There is a red rash at the inner aspect of the left lower extremity on. There no red streaks appreciated. There is good range of motion of the left hip, knee, ankle, and toes. The Achilles tendon is intact. The dorsalis pedis pulses 2+.  Lymphadenopathy:       Head (right side): No submandibular adenopathy present.       Head (left side): No submandibular adenopathy present.    She has no cervical adenopathy.  Neurological: She is alert and oriented to person, place, and time. She has normal strength. No cranial nerve deficit or sensory deficit.  Skin: Skin is warm and dry.  There is a red splotchy type rash that seems to be mostly under the skin. It blanches. It is not tender to touch, except directly over a few of the veins. The area is not hot.  Psychiatric: She has a normal mood and affect. Her speech is normal.  Nursing note and vitals reviewed.    ED Treatments / Results  Labs (all labs ordered are listed, but only abnormal results are displayed) Labs Reviewed - No data to display  EKG  EKG Interpretation None       Radiology No results found.  Procedures Procedures (including critical care time)  Medications Ordered in ED Medications - No data to display   Initial Impression / Assessment and Plan / ED Course Patient seen with me by Dr. Gwenyth Bender.  I have reviewed the triage vital signs and the nursing notes.  Pertinent labs & imaging results that were available during my care of the patient were reviewed by me and considered in my medical decision making (see chart for details).  Clinical Course    **I have reviewed nursing notes, vital signs, and all appropriate lab and imaging  results for this patient.*  Final Clinical Impressions(s) / ED Diagnoses  Vital signs reviewed. The patient has no history of injury or trauma. No history of fever,  chills, or pain involving the lower extremity. The examination favors possible superficial thrombophlebitis. The possibility of very early cellulitis was also placed in the differential diagnosis. Patient has good dorsalis pedis pulses and no temperature changes, doubt acute vascular compromise. There is no unusual swelling, and redness only at the small area of the rash on. Doubt DVT.  The patient is asked to use warm compresses to the rash area. She will use 400 mg of ibuprofen with breakfast, lunch, dinner, and at bedtime. The patient is asked to see her primary physician or return to the emergency department immediately if any changes, problems, or concerns. Patient is in agreement with this discharge plan.    Final diagnoses:  None    New Prescriptions New Prescriptions   No medications on file     Lily Kocher, PA-C 08/05/16 [place], MD 08/06/16 [phone]

## 2016-09-11 ENCOUNTER — Other Ambulatory Visit: Payer: Self-pay | Admitting: Cardiology

## 2016-11-11 ENCOUNTER — Telehealth: Payer: Self-pay | Admitting: Cardiology

## 2016-11-11 NOTE — Telephone Encounter (Signed)
atorvastatin (LIPITOR) 40 MG tablet  Medication  Date: 04/01/2016 Department: [facility] [address] Office Ordering/Authorizing: Jerline Pain, MD  Order Providers   Prescribing Provider Encounter Provider  Jerline Pain, MD Juventino Slovak, CMA  Medication Detail    Disp Refills Start End   atorvastatin (LIPITOR) 40 MG tablet 90 tablet 3 04/01/2016    Sig - Route: Take 1 tablet (40 mg total) by mouth daily. - Oral   E-Prescribing Status: Receipt confirmed by pharmacy (04/01/2016 2:44 PM EDT)   [place], [place]   has refills on file, informed pt she expressed understanding.

## 2016-11-11 NOTE — Telephone Encounter (Signed)
New Message  Patient calling the office for samples of medication:   1.  What medication and dosage are you requesting samples for? atorvastatin (Lipotor) 40 mg tablet 1 tablet once daily  2.  Are you currently out of this medication?  Yes

## 2017-02-16 IMAGING — DX DG CHEST 2V
2 series · 2 of 2 positions shown · non-contrast
Comparison: 03/06/2015

CLINICAL DATA: Two-day history of chest pain.

EXAM:
CHEST  2 VIEW

[chest pa]
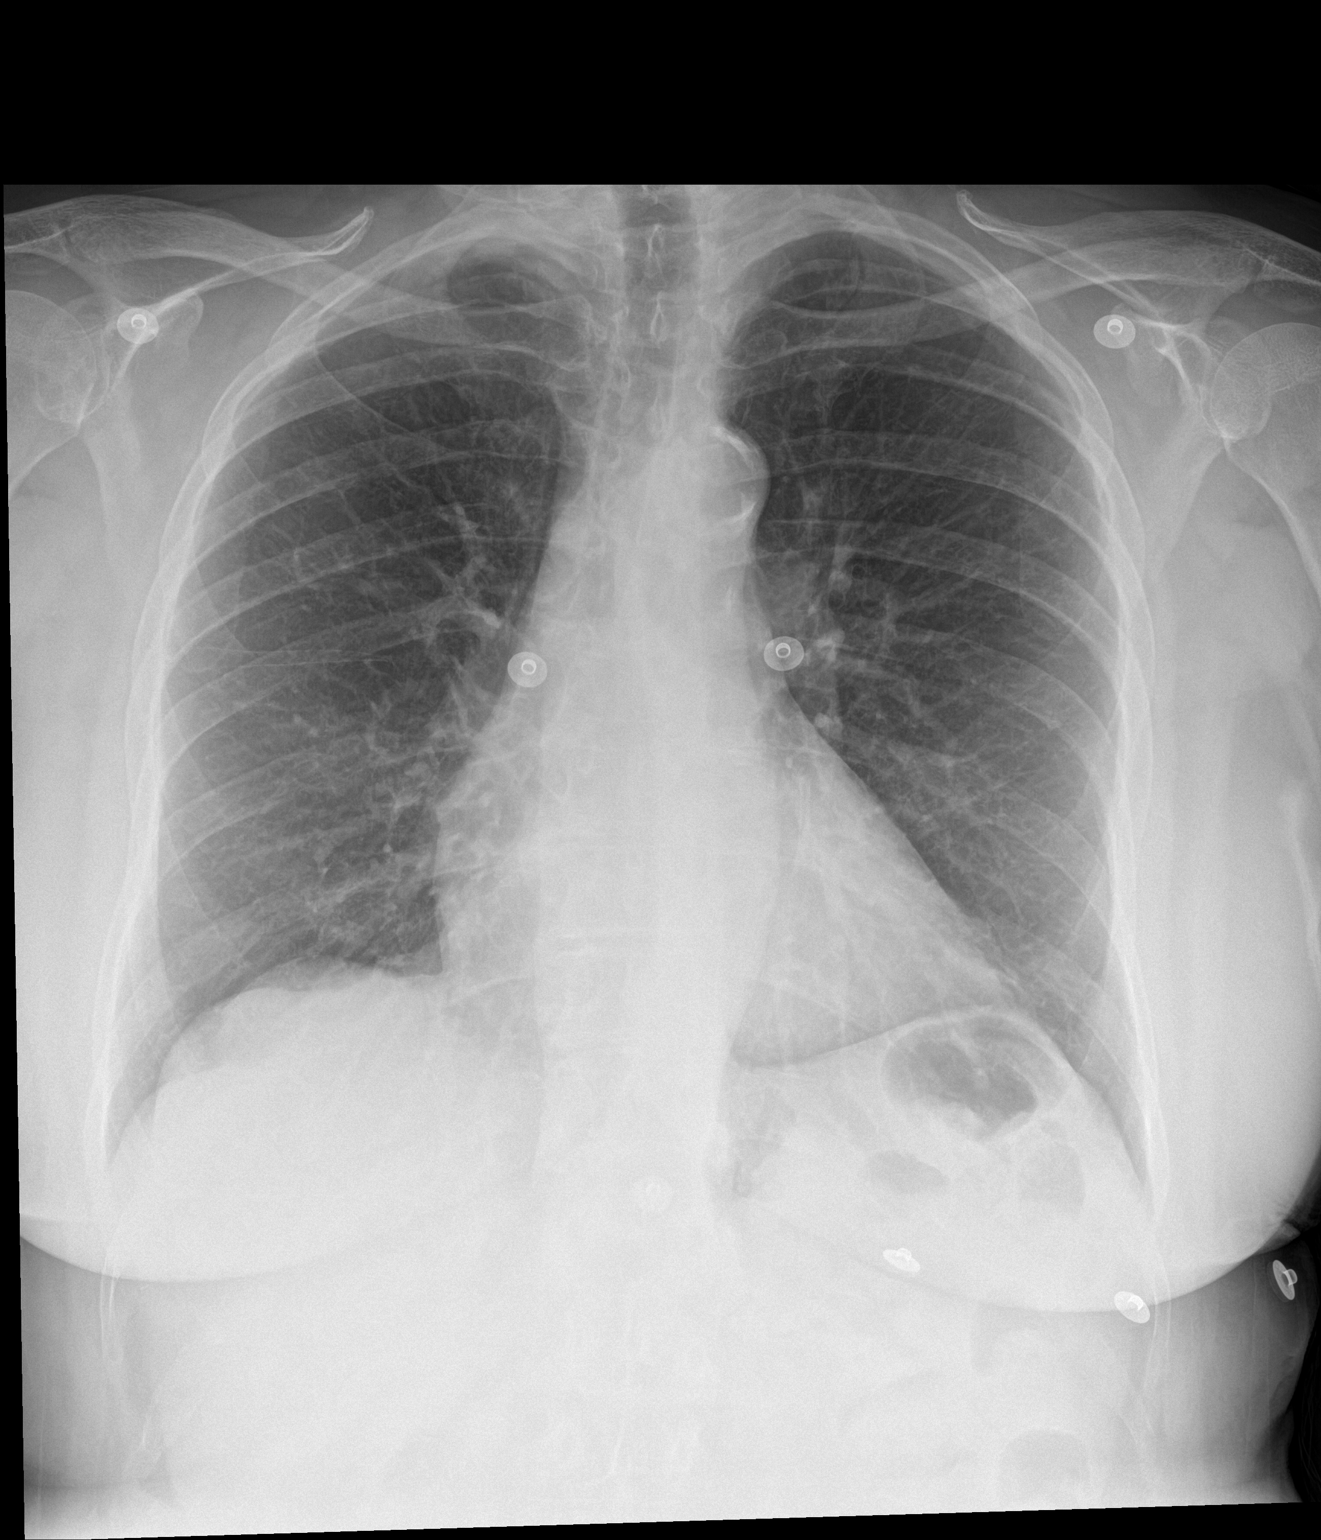

[chest lat]
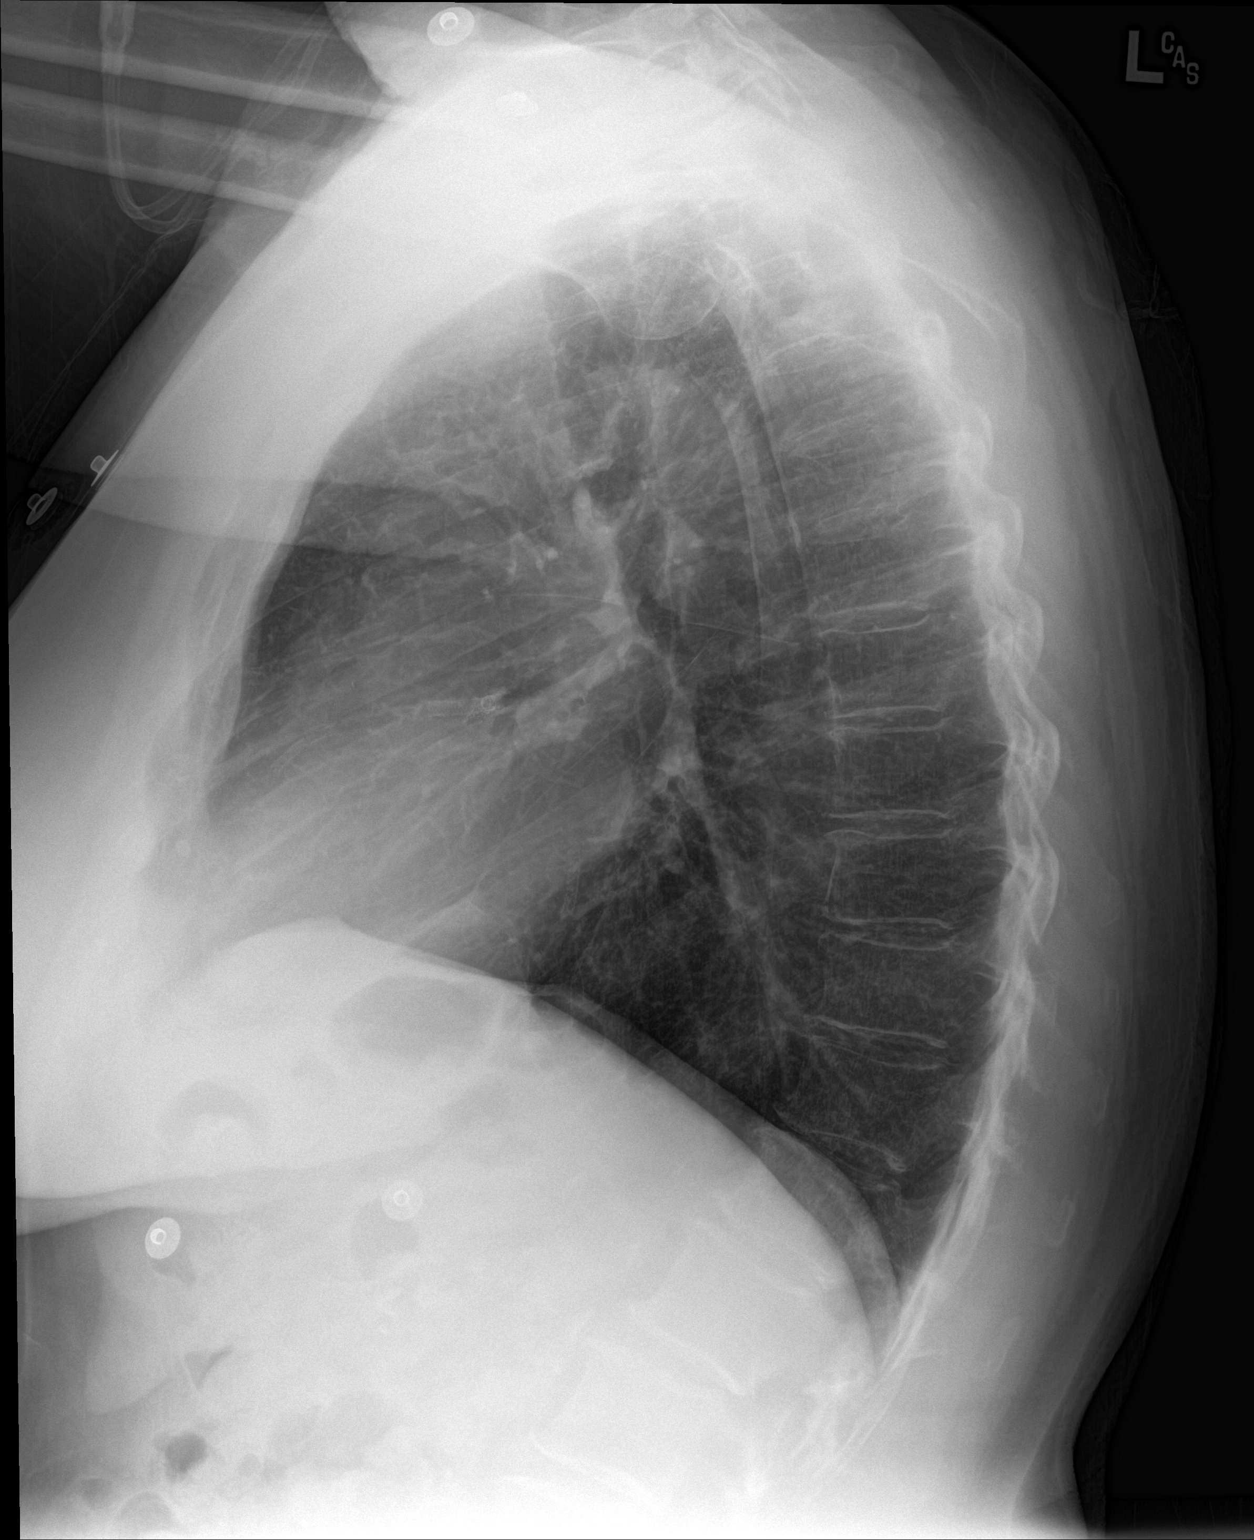

[2 of 2 positions shown; findings below may reference images not displayed]

FINDINGS: The lungs are clear wiithout focal pneumonia, edema, pneumothorax or
pleural effusion. The cardiopericardial silhouette is within normal
limits for size. The visualized bony structures of the thorax are
intact.
IMPRESSION: No active cardiopulmonary disease.

## 2017-02-23 IMAGING — CR DG CHEST 2V
2 series · 2 of 2 positions shown · non-contrast
Comparison: 01/16/2016

CLINICAL DATA: Preop CABG 01/24/2016

EXAM:
CHEST  2 VIEW

[chest lat]
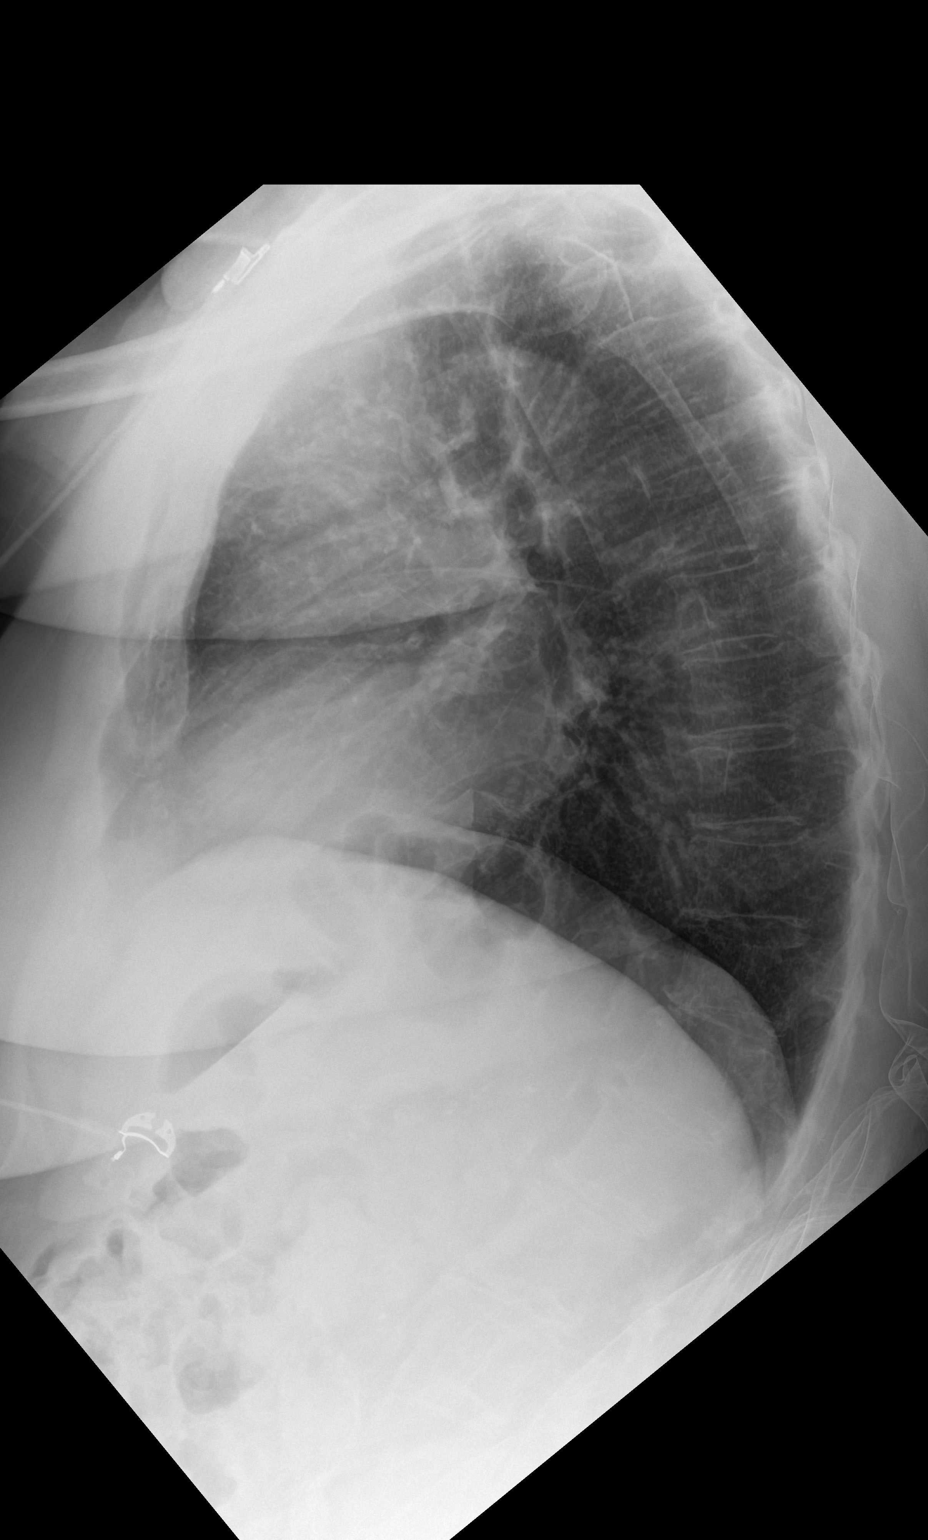

[chest ap]
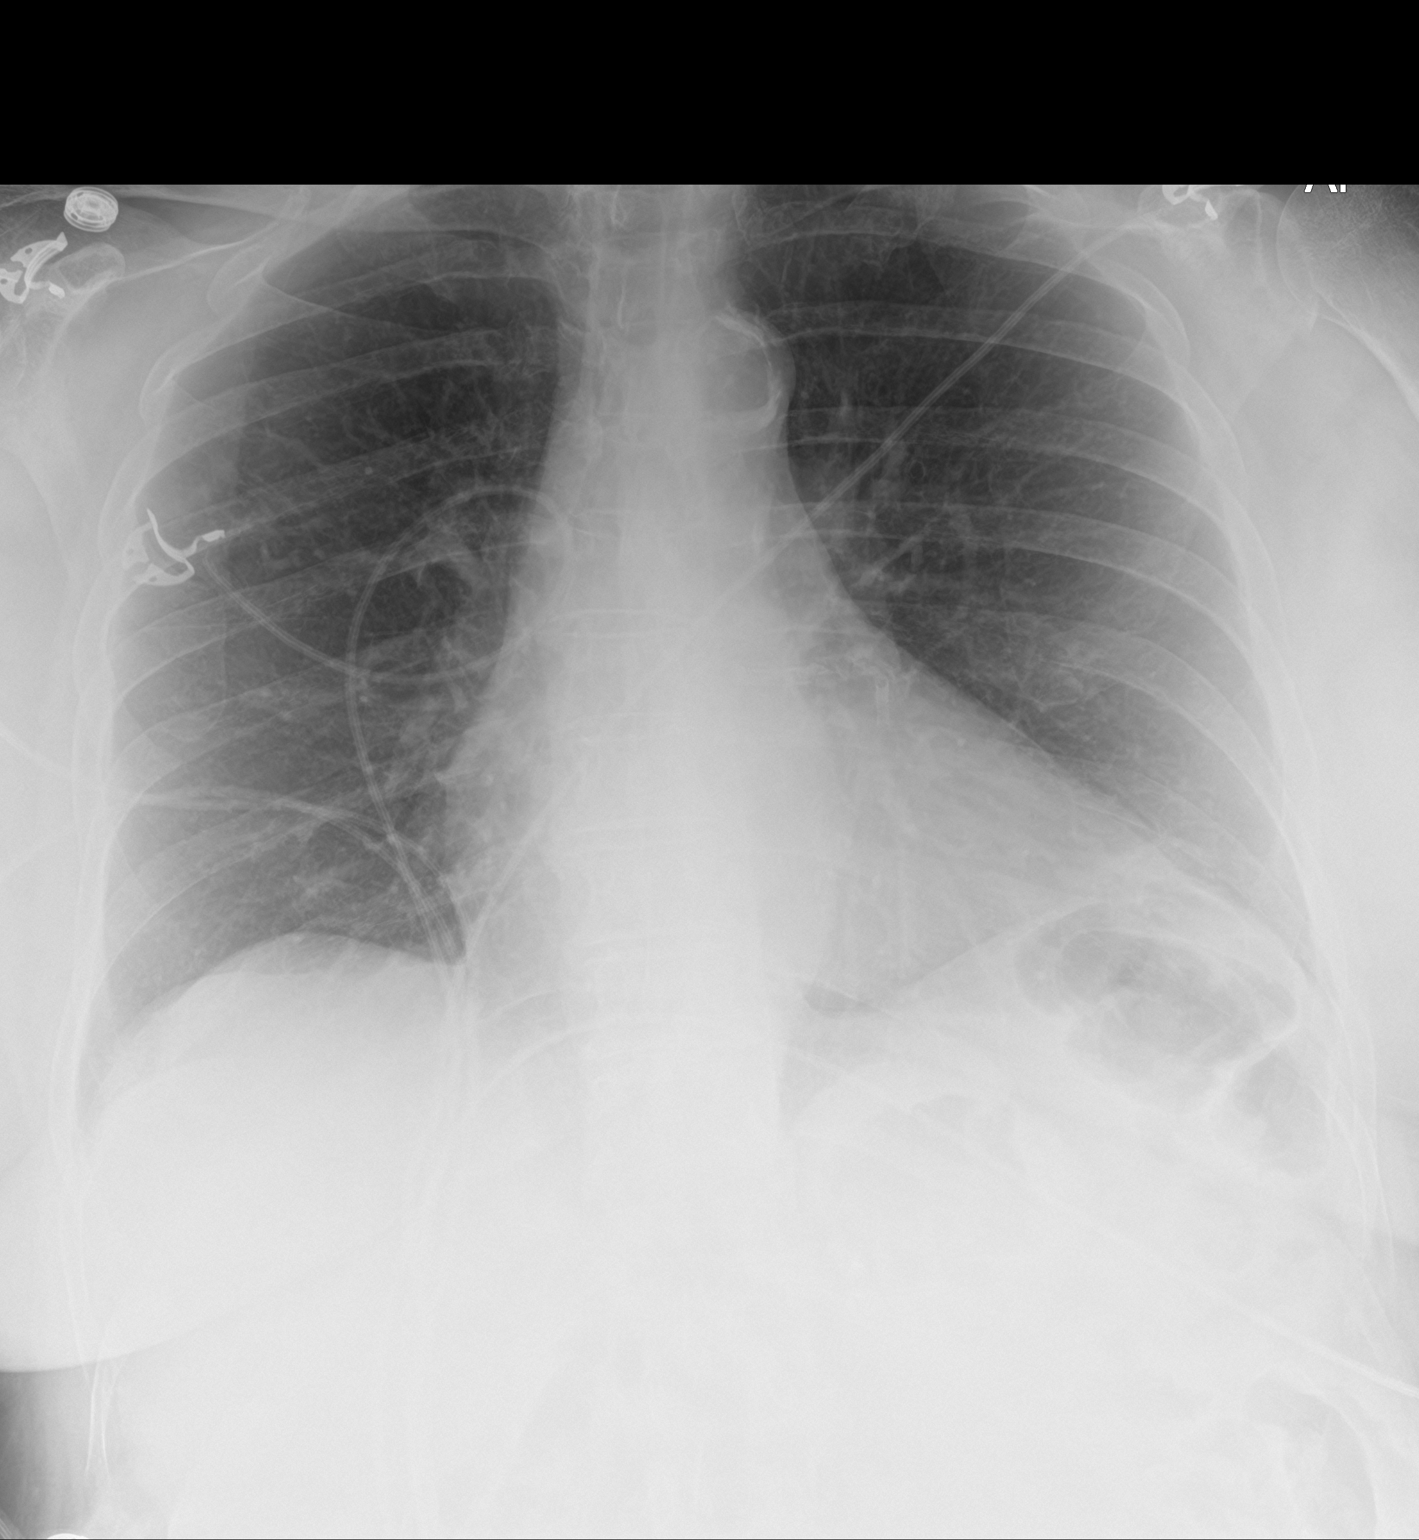

[2 of 2 positions shown; findings below may reference images not displayed]

FINDINGS: Lungs are adequately inflated without consolidation or effusion.
Mild stable cardiomegaly. Evidence of a coronary stent. Moderate
calcified plaque over the aortic arch. Mild degenerate change of the
spine.
IMPRESSION: No acute cardiopulmonary disease.

Mild cardiomegaly.

## 2017-02-26 IMAGING — CR DG CHEST 1V PORT
1 series · 1 of 1 positions shown · non-contrast
Comparison: 01/25/2016

CLINICAL DATA: Status post CABG 01/20/2016

EXAM:
PORTABLE CHEST 1 VIEW

[AP]
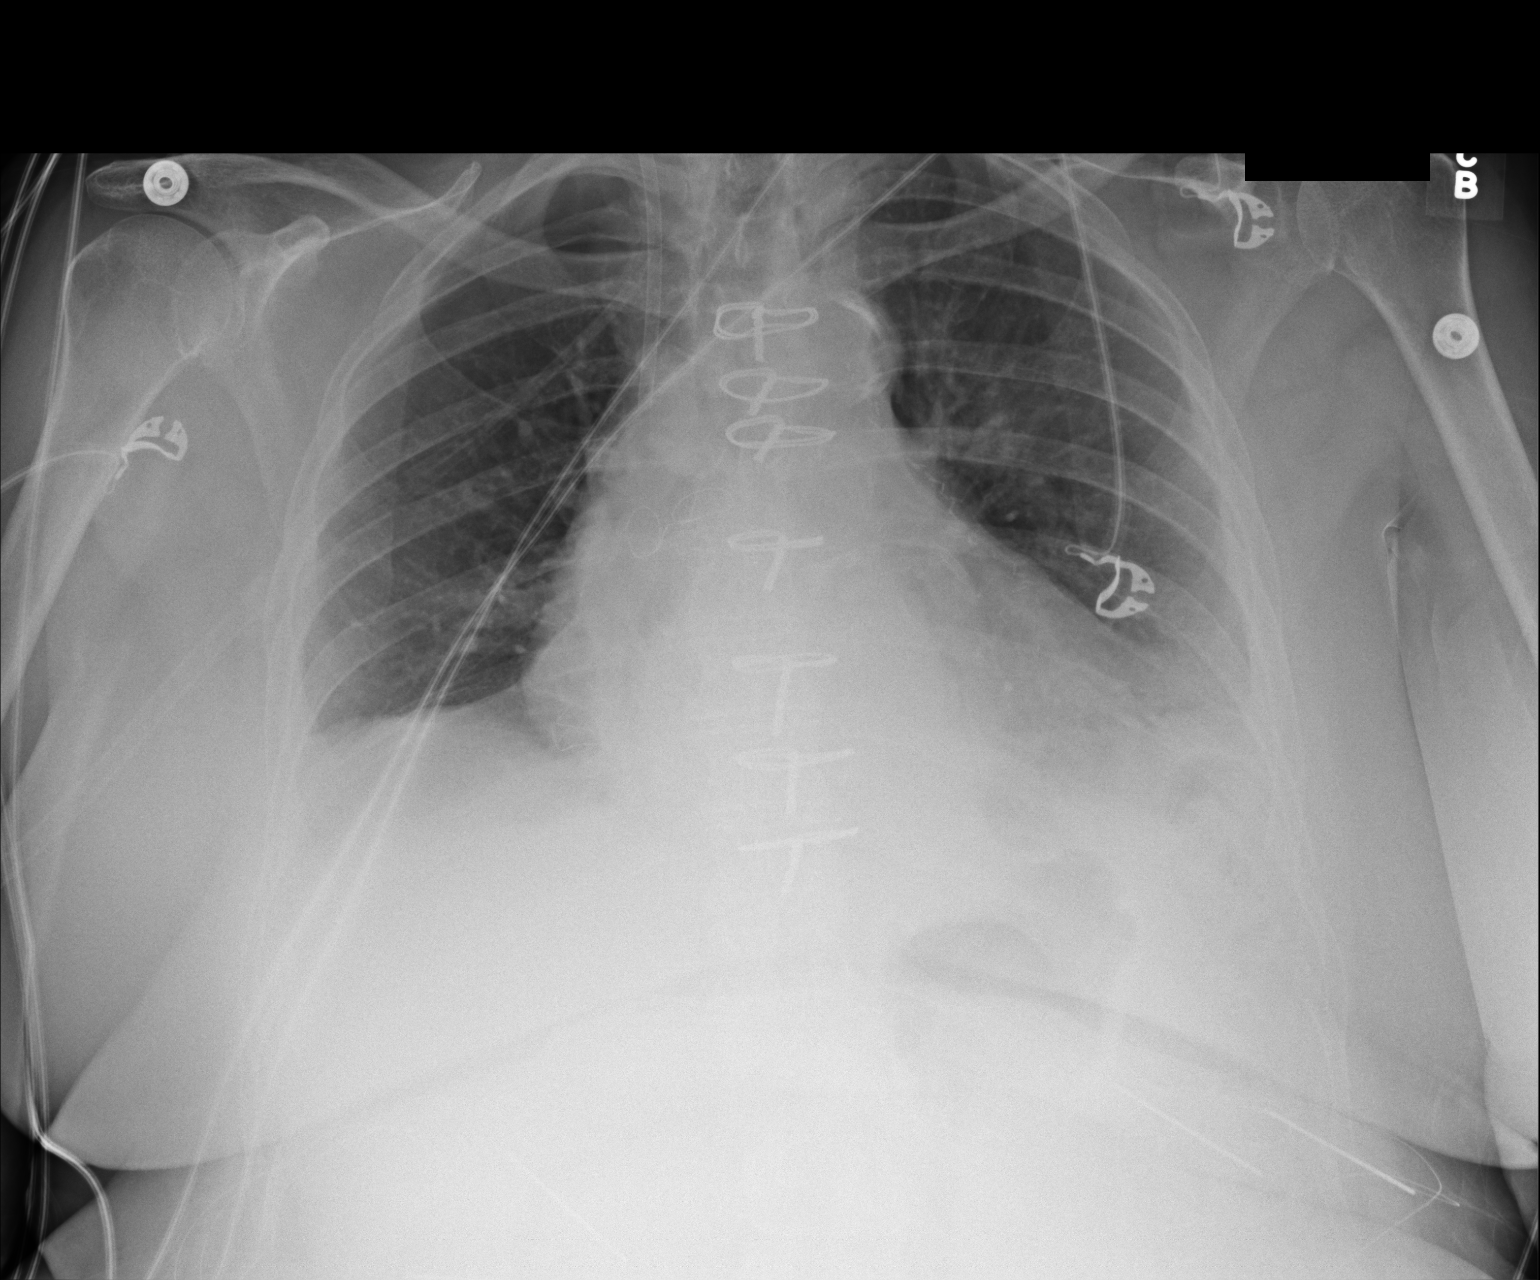

[1 of 1 positions shown; findings below may reference images not displayed]

FINDINGS: Stable subtle small right apical pneumothorax.

F [place] central line has been removed. Right internal jugular
vascular sheath remains.

Postoperative cardiac silhouette prominence status post CABG. Left
chest tube has been removed.

Limited inspiratory effect with bibasilar atelectasis.
IMPRESSION: Persistent small right apical pneumothorax measures about 11 mm.

## 2017-05-12 ENCOUNTER — Emergency Department (HOSPITAL_COMMUNITY)
Admission: EM | Admit: 2017-05-12 | Discharge: 2017-05-12 | Disposition: A | Discharge status: Home / Self Care | Payer: Medicare Other | Attending: Emergency Medicine | Admitting: Emergency Medicine

## 2017-05-12 ENCOUNTER — Emergency Department (HOSPITAL_COMMUNITY): Payer: Medicare Other

## 2017-05-12 ENCOUNTER — Encounter (HOSPITAL_COMMUNITY): Payer: Self-pay | Admitting: Emergency Medicine

## 2017-05-12 DIAGNOSIS — Z96651 Presence of right artificial knee joint: Secondary | ICD-10-CM | POA: Diagnosis not present

## 2017-05-12 DIAGNOSIS — I252 Old myocardial infarction: Secondary | ICD-10-CM | POA: Diagnosis not present

## 2017-05-12 DIAGNOSIS — E039 Hypothyroidism, unspecified: Secondary | ICD-10-CM | POA: Diagnosis not present

## 2017-05-12 DIAGNOSIS — Z7982 Long term (current) use of aspirin: Secondary | ICD-10-CM | POA: Diagnosis not present

## 2017-05-12 DIAGNOSIS — R42 Dizziness and giddiness: Secondary | ICD-10-CM | POA: Diagnosis present

## 2017-05-12 DIAGNOSIS — I160 Hypertensive urgency: Secondary | ICD-10-CM

## 2017-05-12 DIAGNOSIS — Z79899 Other long term (current) drug therapy: Secondary | ICD-10-CM | POA: Insufficient documentation

## 2017-05-12 DIAGNOSIS — I2510 Atherosclerotic heart disease of native coronary artery without angina pectoris: Secondary | ICD-10-CM | POA: Insufficient documentation

## 2017-05-12 DIAGNOSIS — Z85828 Personal history of other malignant neoplasm of skin: Secondary | ICD-10-CM | POA: Diagnosis not present

## 2017-05-12 DIAGNOSIS — Z951 Presence of aortocoronary bypass graft: Secondary | ICD-10-CM | POA: Diagnosis not present

## 2017-05-12 DIAGNOSIS — Z955 Presence of coronary angioplasty implant and graft: Secondary | ICD-10-CM | POA: Diagnosis not present

## 2017-05-12 DIAGNOSIS — Z87891 Personal history of nicotine dependence: Secondary | ICD-10-CM | POA: Insufficient documentation

## 2017-05-12 LAB — CBC WITH DIFFERENTIAL/PLATELET
Basophils Absolute: 0 10*3/uL (ref 0.0–0.1)
Basophils Relative: 1 %
EOS ABS: 0.2 10*3/uL (ref 0.0–0.7)
Eosinophils Relative: 3 %
HCT: 40.5 % (ref 36.0–46.0)
Hemoglobin: 12.3 g/dL (ref 12.0–15.0)
LYMPHS ABS: 1.5 10*3/uL (ref 0.7–4.0)
LYMPHS PCT: 22 %
MCH: 26.3 pg (ref 26.0–34.0)
MCHC: 30.4 g/dL (ref 30.0–36.0)
MCV: 86.7 fL (ref 78.0–100.0)
Monocytes Absolute: 0.4 10*3/uL (ref 0.1–1.0)
Monocytes Relative: 6 %
Neutro Abs: 4.5 10*3/uL (ref 1.7–7.7)
Neutrophils Relative %: 68 %
Platelets: 188 10*3/uL (ref 150–400)
RBC: 4.67 MIL/uL (ref 3.87–5.11)
RDW: 15.1 % (ref 11.5–15.5)
WBC: 6.6 10*3/uL (ref 4.0–10.5)

## 2017-05-12 LAB — BASIC METABOLIC PANEL
ANION GAP: 7 (ref 5–15)
BUN: 16 mg/dL (ref 6–20)
CHLORIDE: 104 mmol/L (ref 101–111)
CO2: 28 mmol/L (ref 22–32)
CREATININE: 1.32 mg/dL — AB (ref 0.44–1.00)
Calcium: 9.4 mg/dL (ref 8.9–10.3)
GFR calc Af Amer: 44 mL/min — ABNORMAL LOW (ref >60)
GFR calc non Af Amer: 38 mL/min — ABNORMAL LOW (ref >60)
Glucose, Bld: 103 mg/dL — ABNORMAL HIGH (ref 65–99)
POTASSIUM: 3.8 mmol/L (ref 3.5–5.1)
SODIUM: 139 mmol/L (ref 135–145)

## 2017-05-12 LAB — TROPONIN I

## 2017-05-12 NOTE — ED Triage Notes (Addendum)
Patient c/o hypertension. Per patient started to experience some dizziness yesterday at 8pm and checked blood pressure, which was 152/94. Patient states "slightly lightheaded." denies any facial drooping, slurred speech, headache, or weakness. Patient state has some swelling in right leg. Patient reports taking Bonine yesterday when dizziness started at 8pm and took metoprolol 25mg  at 8pm. Patient rechecked blood pressure this morning at 8am, 9:30am, and 10am  (146/93, 150/99, and 163/101).

## 2017-05-12 NOTE — ED Provider Notes (Signed)
[facility] DEPT Provider Note   CSN: [id number] Arrival date & time: 05/12/17  1014     History   Chief Complaint Chief Complaint  Patient presents with  . Hypertension    HPI Jessica Taylor is a 76 y.o. female.  HPI  76 year old female presents with hypertension. She states that she start checking her blood pressure last night at around 8:30 PM because she felt transient dizziness while in the car. She states that whenever she would turn her head she felt "lightheaded". She describes this as feeling spinning but very mild. She has a history of recurrent vertigo and it feels like this when it typically starts. She took bonine to help ward these symptoms off. Symptoms then resolved last night. However when she checked her blood pressure was higher than typical at 150 over 80s. This was consistent until around 11 PM and seemed to get better. When she woke back up she checked it again and it was elevated again up to 253 systolic. However her symptoms have not recurred and she has no dizziness or shortness of breath. Last night she had some discomfort in her chest that she describes as acid reflux. She states she's had this multiple times in this occurred after dinner. There were no otherwise chest pain or shortness of breath. This morning she has been a symptomatically and took her blood pressure like prescribed. She also since yesterday morning has noticed some pain and tenderness to her right lower medial leg. Atraumatic. She states she's had a blood clot before and this feels similar.  Past Medical History:  Diagnosis Date  . Abdominal aortic aneurysm ([place])   . Acid reflux   . Anemia   . Arthritis    knees , R shoulder - tx /w injection - 11/2014  . Cancer ([place])    basal cell on nose  . Coronary artery disease   . Hypercholesteremia   . Hypertension   . Hypothyroidism   . MI, old 89  . Peripheral arterial disease (HCC)    hhigh-grade ostial bilateral calcified iliac stenosis  with claudication  . Tobacco abuse   . Vertigo    when lays on left side.    Patient Active Problem List   Diagnosis Date Noted  . 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 CAD (coronary artery disease)   . Coronary artery disease involving native coronary artery of native heart without angina pectoris   . Unstable angina ([place])   . Chest pain 01/17/2016  . Hematuria 01/17/2016  . Coronary artery disease involving native coronary artery of native heart with unstable angina pectoris ([place])   . Claudication ([place]) 06/27/2015  . Acute renal insufficiency post PCI- SCr1.34 03/08/2015  . STEMI- urgent CFX PCI 03/06/15 03/06/2015  . 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 Hypokalemia 03/06/2015  . Hyperglycemia 03/06/2015  . PAD (peripheral artery disease) ([place]) 03/06/2015  . 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 Elevated troponin 02/10/2015  . GERD (gastroesophageal reflux disease) 02/08/2015  . CAD S/P prior PCI to LAD and RCA 02/08/2015  . Hypothyroidism 02/08/2015  . Hypoxia 02/08/2015  . Status post right partial knee replacement   . Primary osteoarthritis of right knee 02/04/2015  . 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 Atherosclerosis of native coronary artery of native heart without angina pectoris 06/06/2014  . Mixed hyperlipidemia 06/06/2014  . Essential hypertension 06/06/2014    Past Surgical History:  Procedure Laterality Date  .  CARDIAC CATHETERIZATION  5 stents  . CARDIAC CATHETERIZATION N/A 01/20/2016   Procedure: Left Heart Cath and Coronary Angiography;  Surgeon: Peter M Martinique, MD;  Location: [facility];  Service: Cardiovascular;  Laterality: N/A;  . CATARACT EXTRACTION W/PHACO Left 05/24/2014   Procedure: CATARACT EXTRACTION PHACO AND INTRAOCULAR LENS PLACEMENT (IOC);  Surgeon: Tonny Branch, MD;  Location: [facility];  Service: Ophthalmology;  Laterality: Left;  CDE:  9.30  . CATARACT EXTRACTION W/PHACO Right 06/18/2014   Procedure: CATARACT EXTRACTION  PHACO AND INTRAOCULAR LENS PLACEMENT RIGHT EYE CDE=10.84;  Surgeon: Tonny Branch, MD;  Location: [facility];  Service: Ophthalmology;  Laterality: Right;  . CORONARY ARTERY BYPASS GRAFT N/A 01/24/2016   Procedure: CORONARY ARTERY BYPASS GRAFTING (CABG);  Surgeon: Gaye Pollack, MD;  Location: [facility];  Service: Open Heart Surgery;  Laterality: N/A;  . CORONARY STENT PLACEMENT    . CORONARY STENT PLACEMENT  03/06/15   CFX DES  . EYE SURGERY    . LEFT HEART CATHETERIZATION WITH CORONARY ANGIOGRAM N/A 03/06/2015   Procedure: LEFT HEART CATHETERIZATION WITH CORONARY ANGIOGRAM;  Surgeon: Lorretta Harp, MD;  Location: [facility] C[facility];  Service: Cardiovascular;  Laterality: N/A;  . PARTIAL KNEE ARTHROPLASTY Right 02/04/2015   Procedure: UNICOMPARTMENTAL KNEE;  Surgeon: Dorna Leitz, MD;  Location: [facility];  Service: Orthopedics;  Laterality: Right;  . PERIPHERAL VASCULAR CATHETERIZATION Bilateral 05/13/2015   Procedure: Lower Extremity Angiography;  Surgeon: Lorretta Harp, MD;  Location: [facility];  Service: Cardiovascular;  Laterality: Bilateral;  . PERIPHERAL VASCULAR CATHETERIZATION N/A 05/13/2015   Procedure: Abdominal Aortogram;  Surgeon: Lorretta Harp, MD;  Location: [facility];  Service: Cardiovascular;  Laterality: N/A;  . PERIPHERAL VASCULAR CATHETERIZATION Bilateral 06/27/2015   Procedure: Peripheral Vascular Intervention;  Surgeon: Lorretta Harp, MD;  Location: [facility];  Service: Cardiovascular;  Laterality: Bilateral;  ILIACS  . PERIPHERAL VASCULAR CATHETERIZATION Bilateral 06/27/2015   Procedure: Peripheral Vascular Atherectomy;  Surgeon: Lorretta Harp, MD;  Location: [facility];  Service: Cardiovascular;  Laterality: Bilateral;  . TEE WITHOUT CARDIOVERSION N/A 01/24/2016   Procedure: TRANSESOPHAGEAL ECHOCARDIOGRAM (TEE);  Surgeon: Gaye Pollack, MD;  Location: [facility];  Service: Open Heart Surgery;  Laterality: N/A;  . TONSILLECTOMY     age 63  . TUBAL LIGATION        OB History    Gravida Para Term Preterm AB Living   4 4 4     4    SAB TAB Ectopic Multiple Live Births                   Home Medications    Prior to Admission medications   Medication Sig Start Date End Date Taking? Authorizing Provider  aspirin EC 81 MG tablet Take 81 mg by mouth daily.    Yes [provider]  atorvastatin (LIPITOR) 40 MG tablet Take 1 tablet (40 mg total) by mouth daily. 04/01/16  Yes Jerline Pain, MD  clopidogrel (PLAVIX) 75 MG tablet TAKE ONE TABLET BY MOUTH ONCE DAILY. 09/11/16  Yes Jerline Pain, MD  HYDROcodone-acetaminophen (NORCO/VICODIN) 5-325 MG tablet Take 1 tablet by mouth 2 (two) times daily. 04/16/17  Yes [provider]  ketotifen (ZADITOR) 0.025 % ophthalmic solution Place 1 drop into both eyes 2 (two) times daily.   Yes [provider]  levothyroxine (SYNTHROID, LEVOTHROID) 50 MCG tablet Take 50 mcg by mouth daily before breakfast.   Yes [provider]  metoprolol tartrate (LOPRESSOR) 25 MG tablet Take 1 tablet (25 mg total) by mouth 2 (two) times daily. 03/24/16  Yes Jerline Pain, MD  esomeprazole (NEXIUM) 40 MG capsule TAKE 1 CAPSULE BY MOUTH DAILY AT NOON. Patient not taking: Reported on 05/12/2017 04/29/16   Jerline Pain, MD    Family History Family History  Problem Relation Age of Onset  . Cancer Mother 64  . Cancer Father 41  . Hypertension Maternal Grandmother   . Stroke Maternal Grandfather   . Hypertension Son   . Heart attack Neg Hx     Social History Social History  Substance Use Topics  . Smoking status: Former Smoker    Packs/day: 1.50    Years: 42.00    Types: Cigarettes    Quit date: 05/19/1999  . Smokeless tobacco: Never Used  . Alcohol use No     Allergies   Shrimp [shellfish allergy]   Review of Systems Review of Systems  Constitutional: Negative for fever.  Eyes: Negative for visual disturbance.  Respiratory: Negative for shortness of breath.   Cardiovascular:  Negative for chest pain.  Gastrointestinal: Negative for abdominal pain and vomiting.  Musculoskeletal: Positive for myalgias.  Neurological: Positive for dizziness. Negative for syncope.  All other systems reviewed and are negative.    Physical Exam Updated Vital Signs BP (!) 159/88   Pulse 72   Temp 98.1 F (36.7 C) (Oral)   Resp 17   Ht 5\' 3"  (1.6 m)   Wt 72.6 kg (160 lb)   SpO2 99%   BMI 28.34 kg/m   Physical Exam  Constitutional: She is oriented to person, place, and time. She appears well-developed and well-nourished.  HENT:  Head: Normocephalic and atraumatic.  Right Ear: External ear normal.  Left Ear: External ear normal.  Nose: Nose normal.  Eyes: EOM are normal. Pupils are equal, round, and reactive to light. Right eye exhibits no discharge. Left eye exhibits no discharge.  Cardiovascular: Normal rate, regular rhythm and normal heart sounds.   Pulses:      Dorsalis pedis pulses are 2+ on the right side, and 2+ on the left side.  Pulmonary/Chest: Effort normal and breath sounds normal.  Abdominal: Soft. There is no tenderness.  Musculoskeletal:  Mild tenderness along distal right medial lower leg just proximal to ankle. No significant palpable cord or swelling. No skin changes. No calf tenderness  Neurological: She is alert and oriented to person, place, and time.  CN 3-12 grossly intact. 5/5 strength in all 4 extremities. Grossly normal sensation. Normal finger to nose.   Skin: Skin is warm and dry. She is not diaphoretic.  Nursing note and vitals reviewed.    ED Treatments / Results  Labs (all labs ordered are listed, but only abnormal results are displayed) Labs Reviewed  BASIC METABOLIC PANEL - Abnormal; Notable for the following:       Result Value   Glucose, Bld 103 (*)    Creatinine, Ser 1.32 (*)    GFR calc non Af Amer 38 (*)    GFR calc Af Amer 44 (*)    All other components within normal limits  CBC WITH DIFFERENTIAL/PLATELET  TROPONIN I     EKG  EKG Interpretation  Date/Time:  Wednesday May 12 2017 10:40:45 EDT Ventricular Rate:  67 PR Interval:    QRS Duration: 101 QT Interval:  413 QTC Calculation: 436 R Axis:   -12 Text Interpretation:  Sinus rhythm Low voltage, precordial leads Borderline  T wave abnormalities T wave changes similar to Mar 2017 Confirmed by Sherwood Gambler [phone]) on 05/12/2017 10:55:44 AM       Radiology US Venous Img Lower Unilateral Right  Result Date: 05/12/2017 CLINICAL DATA:  Right lower extremity pain EXAM: RIGHT LOWER EXTREMITY VENOUS DUPLEX ULTRASOUND TECHNIQUE: Gray-scale sonography with graded compression, as well as color Doppler and duplex ultrasound were performed to evaluate the right lower extremity deep venous system from the level of the common femoral vein and including the common femoral, femoral, profunda femoral, popliteal and calf veins including the posterior tibial, peroneal and gastrocnemius veins when visible. The superficial great saphenous vein was also interrogated. Spectral Doppler was utilized to evaluate flow at rest and with distal augmentation maneuvers in the common femoral, femoral and popliteal veins. COMPARISON:  None. FINDINGS: Contralateral Common Femoral Vein: Respiratory phasicity is normal and symmetric with the symptomatic side. No evidence of thrombus. Normal compressibility. Common Femoral Vein: No evidence of thrombus. Normal compressibility, respiratory phasicity and response to augmentation. Saphenofemoral Junction: No evidence of thrombus. Normal compressibility and flow on color Doppler imaging. Profunda Femoral Vein: No evidence of thrombus. Normal compressibility and flow on color Doppler imaging. Femoral Vein: No evidence of thrombus. Normal compressibility, respiratory phasicity and response to augmentation. Popliteal Vein: No evidence of thrombus. Normal compressibility, respiratory phasicity and response to augmentation. Calf Veins: No evidence of  thrombus. Normal compressibility and flow on color Doppler imaging. Superficial Great Saphenous Vein: No evidence of thrombus. Normal compressibility and flow on color Doppler imaging. Venous Reflux:  None. Other Findings:  None. IMPRESSION: No evidence of deep venous thrombosis in the right lower extremity. Left common femoral vein also patent. Electronically Signed   By: Lowella Grip III M.D.   On: 05/12/2017 12:04    Procedures Procedures (including critical care time)  Medications Ordered in ED Medications - No data to display   Initial Impression / Assessment and Plan / ED Course  I have reviewed the triage vital signs and the nursing notes.  Pertinent labs & imaging results that were available during my care of the patient were reviewed by me and considered in my medical decision making (see chart for details).     Patient appears to have asymptomatic HTN. While she had some brief dizziness yesterday, her BP is higher today than the notes she showed me and is asymptomatic. She also has not checked her BP in "a while" so it may be that this is her baseline. She has follow up with her cardiologist in less than 1 week already scheduled, and this is who manages her BP. She has no signs of end organ damage. Unclear why she has the focal lower right leg pain, but minimal swelling, probably superficial. No abscess/infection. DVT US negative. F/u with cardiology, discussed return precautions.  Final Clinical Impressions(s) / ED Diagnoses   Final diagnoses:  Hypertensive urgency    New Prescriptions Discharge Medication List as of 05/12/2017 12:31 PM       Sherwood Gambler, MD 05/12/17 [phone]

## 2017-05-12 NOTE — ED Notes (Signed)
Patient transported to Ultrasound 

## 2017-05-17 ENCOUNTER — Other Ambulatory Visit: Payer: Self-pay | Admitting: Cardiology

## 2017-07-16 ENCOUNTER — Other Ambulatory Visit: Payer: Self-pay | Admitting: Cardiology

## 2017-07-27 ENCOUNTER — Ambulatory Visit (INDEPENDENT_AMBULATORY_CARE_PROVIDER_SITE_OTHER): Payer: Medicare Other | Admitting: Nurse Practitioner

## 2017-07-27 ENCOUNTER — Encounter: Payer: Self-pay | Admitting: Nurse Practitioner

## 2017-07-27 VITALS — BP 140/82 | HR 64 | Ht 63.0 in | Wt 167.0 lb

## 2017-07-27 DIAGNOSIS — I2583 Coronary atherosclerosis due to lipid rich plaque: Secondary | ICD-10-CM

## 2017-07-27 DIAGNOSIS — I2510 Atherosclerotic heart disease of native coronary artery without angina pectoris: Secondary | ICD-10-CM | POA: Diagnosis not present

## 2017-07-27 DIAGNOSIS — Z951 Presence of aortocoronary bypass graft: Secondary | ICD-10-CM

## 2017-07-27 DIAGNOSIS — I10 Essential (primary) hypertension: Secondary | ICD-10-CM | POA: Diagnosis not present

## 2017-07-27 DIAGNOSIS — I739 Peripheral vascular disease, unspecified: Secondary | ICD-10-CM

## 2017-07-27 MED ORDER — CLOPIDOGREL BISULFATE 75 MG PO TABS: ORAL_TABLET | ORAL | 11 refills | Status: DC

## 2017-07-27 MED ORDER — METOPROLOL TARTRATE 25 MG PO TABS
25.0000 mg | ORAL_TABLET | Freq: Two times a day (BID) | ORAL | 11 refills | Status: DC
Start: 2017-07-27 — End: 2018-06-04

## 2017-07-27 MED ORDER — ATORVASTATIN CALCIUM 40 MG PO TABS
40.0000 mg | ORAL_TABLET | Freq: Every day | ORAL | 11 refills | Status: DC
Start: 2017-07-27 — End: 2018-05-06

## 2017-07-27 NOTE — Progress Notes (Signed)
CARDIOLOGY OFFICE NOTE  Date:  07/27/2017    Jessica Taylor Date of Birth: 1941/08/18 Medical Record #[id number]  PCP:  Antony Contras, MD  Cardiologist:  [facility]   Chief Complaint  Patient presents with  . Coronary Artery Disease    1 year check - seen for Dr. Marlou Porch    History of Present Illness: Jessica Taylor is a 76 y.o. female who presents today for a one year check. Seen for Dr. Marlou Porch.   She has known coronary artery disease status post CABG 3 LIMA to LAD, SVG to ramus, SVG to PDA in March of 2017. Previous PCI to LAD and RCA as well as PCI of occluded left circumflex after myocardial infarction in 02/2015 with recurrent anginal symptoms.   Other issues include bilateral iliac PCI and stenting on 06/2015 by Dr. Gwenlyn Found, stage III chronic kidney disease as well as hyperlipidemia. She had been on Plavix because of non-ST elevation MI (troponin peaked at 41 on 03/06/15 prior to her bypass surgery) and peripheral vascular disease.   Last seen back in August of 2017 - felt to be doing ok. In the ER back in June with HTN/vertigo.   Comes in today. Here alone.  She feels like she is doing ok. She is more limited by indigestion - had been on Nexium for many years and got switched because of her Plavix. Now on Protonix but it does not really work for her. Some other agent called in but it cost too much. Was recommended to see Gi but she has put it off. No chest pain. Says she can tell the difference between her heart pain and her indigestion. She will use some TUMS with relief. No exertional symptoms.  Does not really seem to remember too much about her ER visit from June. BP at home is about 120/80 typically. Tries to stay active but no real regular exercise. Limited by arthritis. Labs are typically done by her PCP - noted lipids from earlier this summer. Overall, she feels like she is doing ok.   Past Medical History:  Diagnosis Date  . Abdominal aortic aneurysm ([place])   . Acid reflux     . Anemia   . Arthritis    knees , R shoulder - tx /w injection - 11/2014  . Cancer ([place])    basal cell on nose  . Coronary artery disease   . Hypercholesteremia   . Hypertension   . Hypothyroidism   . MI, old 15  . Peripheral arterial disease (H[facility])    hhigh-grade ostial bilateral calcified iliac stenosis with claudication  . Tobacco abuse   . Vertigo    when lays on left side.    Past Surgical History:  Procedure Laterality Date  . CARDIAC CATHETERIZATION  5 stents  . CARDIAC CATHETERIZATION N/A 01/20/2016   Procedure: Left Heart Cath and Coronary Angiography;  Surgeon: Peter M Martinique, MD;  Location: [facility];  Service: Cardiovascular;  Laterality: N/A;  . CATARACT EXTRACTION W/PHACO Left 05/24/2014   Procedure: CATARACT EXTRACTION PHACO AND INTRAOCULAR LENS PLACEMENT (IOC);  Surgeon: Tonny Branch, MD;  [facility];  Service: Ophthalmology;  Laterality: Left;  CDE:  9.30  . CATARACT EXTRACTION W/PHACO Right 06/18/2014   Procedure: CATARACT EXTRACTION PHACO AND INTRAOCULAR LENS PLACEMENT RIGHT EYE CDE=10.84;  Surgeon: Tonny Branch, MD;  Location: [facility];  Service: Ophthalmology;  Laterality: Right;  . CORONARY ARTERY BYPASS GRAFT N/A 01/24/2016   Procedure: CORONARY ARTERY BYPASS GRAFTING (  CABG);  Surgeon: Gaye Pollack, MD;  Location: [facility];  Service: Open Heart Surgery;  Laterality: N/A;  . CORONARY STENT PLACEMENT    . CORONARY STENT PLACEMENT  03/06/15   CFX DES  . EYE SURGERY    . LEFT HEART CATHETERIZATION WITH CORONARY ANGIOGRAM N/A 03/06/2015   Procedure: LEFT HEART CATHETERIZATION WITH CORONARY ANGIOGRAM;  Surgeon: Lorretta Harp, MD;  Location: [facility] CATH LAB;  Service: Cardiovascular;  Laterality: N/A;  . PARTIAL KNEE ARTHROPLASTY Right 02/04/2015   Procedure: UNICOMPARTMENTAL KNEE;  Surgeon: Dorna Leitz, MD;  Location: [facility];  Service: Orthopedics;  Laterality: Right;  . PERIPHERAL VASCULAR CATHETERIZATION Bilateral 05/13/2015   Procedure: Lower Extremity  Angiography;  Surgeon: Lorretta Harp, MD;  Location: [facility];  Service: Cardiovascular;  Laterality: Bilateral;  . PERIPHERAL VASCULAR CATHETERIZATION N/A 05/13/2015   Procedure: Abdominal Aortogram;  Surgeon: Lorretta Harp, MD;  Location: [facility];  Service: Cardiovascular;  Laterality: N/A;  . PERIPHERAL VASCULAR CATHETERIZATION Bilateral 06/27/2015   Procedure: Peripheral Vascular Intervention;  Surgeon: Lorretta Harp, MD;  Location: [facility];  Service: Cardiovascular;  Laterality: Bilateral;  ILIACS  . PERIPHERAL VASCULAR CATHETERIZATION Bilateral 06/27/2015   Procedure: Peripheral Vascular Atherectomy;  Surgeon: Lorretta Harp, MD;  Location: [facility];  Service: Cardiovascular;  Laterality: Bilateral;  . TEE WITHOUT CARDIOVERSION N/A 01/24/2016   Procedure: TRANSESOPHAGEAL ECHOCARDIOGRAM (TEE);  Surgeon: Gaye Pollack, MD;  Location: [facility];  Service: Open Heart Surgery;  Laterality: N/A;  . TONSILLECTOMY     age 23  . TUBAL LIGATION       Medications: Current Meds  Medication Sig  . aspirin EC 81 MG tablet Take 81 mg by mouth daily.   Marland Kitchen atorvastatin (LIPITOR) 40 MG tablet Take 1 tablet (40 mg total) by mouth daily.  . clopidogrel (PLAVIX) 75 MG tablet TAKE (1) TABLET BY MOUTH ONCE DAILY.  Marland Kitchen HYDROcodone-acetaminophen (NORCO/VICODIN) 5-325 MG tablet Take 1 tablet by mouth 2 (two) times daily.  Marland Kitchen ketotifen (ZADITOR) 0.025 % ophthalmic solution Place 1 drop into both eyes 2 (two) times daily.  Marland Kitchen levothyroxine (SYNTHROID, LEVOTHROID) 50 MCG tablet Take 50 mcg by mouth daily before breakfast.  . metoprolol tartrate (LOPRESSOR) 25 MG tablet Take 1 tablet (25 mg total) by mouth 2 (two) times daily.  . pantoprazole (PROTONIX) 40 MG tablet   . [DISCONTINUED] atorvastatin (LIPITOR) 40 MG tablet TAKE ONE TABLET BY MOUTH ONCE DAILY.  . [DISCONTINUED] clopidogrel (PLAVIX) 75 MG tablet TAKE (1) TABLET BY MOUTH ONCE DAILY.  . [DISCONTINUED] metoprolol  tartrate (LOPRESSOR) 25 MG tablet Take 1 tablet (25 mg total) by mouth 2 (two) times daily.   Current Facility-Administered Medications for the 07/27/17 encounter (Office Visit) with Burtis Junes, NP  Medication  . diphenhydrAMINE (BENADRYL) injection 25 mg  . famotidine (PEPCID) 20 mg in sodium chloride 0.9 % 50 mL IVPB     Allergies: Allergies  Allergen Reactions  . Shrimp [Shellfish Allergy] Nausea And Vomiting    Throws up violently    Social History: The patient  reports that she quit smoking about 18 years ago. Her smoking use included Cigarettes. She has a 63.00 pack-year smoking history. She has never used smokeless tobacco. She reports that she does not drink alcohol or use drugs.   Family History: The patient's family history includes Cancer (age of onset: 78) in her father; Cancer (age of onset: 39) in her mother; Hypertension in her maternal grandmother  and son; Stroke in her maternal grandfather.   Review of Systems: Please see the history of present illness.   Otherwise, the review of systems is positive for none.   All other systems are reviewed and negative.   Physical Exam: VS:  BP 140/82 (BP Location: Left Arm, Patient Position: Sitting, Cuff Size: Large)   Pulse 64   Ht 5\' 3"  (1.6 m)   Wt 167 lb (75.8 kg)   BMI 29.58 kg/m  .  BMI Body mass index is 29.58 kg/m.  Wt Readings from Last 3 Encounters:  07/27/17 167 lb (75.8 kg)  05/12/17 160 lb (72.6 kg)  08/05/16 159 lb (72.1 kg)    General: Pleasant. Well developed, well nourished and in no acute distress.   HEENT: Normal.  Neck: Supple, no JVD, carotid bruits, or masses noted.  Cardiac: Regular rate and rhythm. No murmurs, rubs, or gallops. No edema.  Respiratory:  Lungs are clear to auscultation bilaterally with normal work of breathing.  GI: Soft and nontender.  MS: No deformity or atrophy. Gait and ROM intact.  Skin: Warm and dry. Color is normal.  Neuro:  Strength and sensation are intact and no  gross focal deficits noted.  Psych: Alert, appropriate and with normal affect.   LABORATORY DATA:  EKG:  EKG is not ordered today.  Lab Results  Component Value Date   WBC 6.6 05/12/2017   HGB 12.3 05/12/2017   HCT 40.5 05/12/2017   PLT 188 05/12/2017   GLUCOSE 103 (H) 05/12/2017   CHOL 188 07/13/2016   TRIG 259 (H) 07/13/2016   HDL 38 (L) 07/13/2016   LDLCALC 98 07/13/2016   ALT 14 07/13/2016   AST 20 03/06/2015   NA 139 05/12/2017   K 3.8 05/12/2017   CL 104 05/12/2017   CREATININE 1.32 (H) 05/12/2017   BUN 16 05/12/2017   CO2 28 05/12/2017   TSH 0.844 03/06/2015   INR 1.45 01/24/2016   HGBA1C 5.8 (H) 01/23/2016        BNP (last 3 results) No results for input(s): BNP in the last 8760 hours.  ProBNP (last 3 results) No results for input(s): PROBNP in the last 8760 hours.   Other Studies Reviewed Today:  1. Coronary artery disease status post bypass surgery 02/2015 -LIMA to LAD, SVG to ramus, SVG to PDA-Dr. Cyndia Bent -Overall doing very well.  No anginal symptoms. Will keep her on her current regimen. This will include Plavix. Refilled her medicine today.   2. Hyperlipidemia - most recent lab noted - she remains on statin therapy.   3. PVD - Prior bilateral iliac stenting with Dr. Gwenlyn Found. No issues noted. Pulses are stable.   4. CKD    Current medicines are reviewed with the patient today.  The patient does not have concerns regarding medicines other than what has been noted above.  The following changes have been made:  See above.  Labs/ tests ordered today include:   No orders of the defined types were placed in this encounter.    Disposition:   FU with Dr. Marlou Porch in one year.   Patient is agreeable to this plan and will call if any problems develop in the interim.   SignedTruitt Merle, NP  07/27/2017 3:34 PM  [facility] [address] [place], [place]  [address] Phone: [phone] Fax: [phone]

## 2017-07-27 NOTE — Patient Instructions (Signed)
We will be checking the following labs today - NONE   Medication Instructions:    Continue with your current medicines. I did send in some refills for you.     Testing/Procedures To Be Arranged:  N/A  Follow-Up:   See Dr. Marlou Porch in one year.     Other Special Instructions:   Think about going to see the GI doctor.     If you need a refill on your cardiac medications before your next appointment, please call your pharmacy.   Call the [place] office at [phone] if you have any questions, problems or concerns.

## 2017-09-12 ENCOUNTER — Encounter (HOSPITAL_COMMUNITY): Payer: Self-pay | Admitting: Emergency Medicine

## 2017-09-12 ENCOUNTER — Emergency Department (HOSPITAL_COMMUNITY)
Admission: EM | Admit: 2017-09-12 | Discharge: 2017-09-12 | Disposition: A | Discharge status: Home / Self Care | Payer: Medicare Other | Attending: Emergency Medicine | Admitting: Emergency Medicine

## 2017-09-12 DIAGNOSIS — I2510 Atherosclerotic heart disease of native coronary artery without angina pectoris: Secondary | ICD-10-CM | POA: Insufficient documentation

## 2017-09-12 DIAGNOSIS — I714 Abdominal aortic aneurysm, without rupture: Secondary | ICD-10-CM | POA: Insufficient documentation

## 2017-09-12 DIAGNOSIS — Z87891 Personal history of nicotine dependence: Secondary | ICD-10-CM | POA: Insufficient documentation

## 2017-09-12 DIAGNOSIS — Z85828 Personal history of other malignant neoplasm of skin: Secondary | ICD-10-CM | POA: Insufficient documentation

## 2017-09-12 DIAGNOSIS — R1032 Left lower quadrant pain: Secondary | ICD-10-CM | POA: Diagnosis present

## 2017-09-12 DIAGNOSIS — Z7982 Long term (current) use of aspirin: Secondary | ICD-10-CM | POA: Diagnosis not present

## 2017-09-12 DIAGNOSIS — E039 Hypothyroidism, unspecified: Secondary | ICD-10-CM | POA: Diagnosis not present

## 2017-09-12 DIAGNOSIS — I70269 Atherosclerosis of native arteries of extremities with gangrene, unspecified extremity: Secondary | ICD-10-CM | POA: Insufficient documentation

## 2017-09-12 DIAGNOSIS — K5792 Diverticulitis of intestine, part unspecified, without perforation or abscess without bleeding: Secondary | ICD-10-CM | POA: Diagnosis not present

## 2017-09-12 DIAGNOSIS — I10 Essential (primary) hypertension: Secondary | ICD-10-CM | POA: Diagnosis not present

## 2017-09-12 DIAGNOSIS — Z951 Presence of aortocoronary bypass graft: Secondary | ICD-10-CM | POA: Insufficient documentation

## 2017-09-12 DIAGNOSIS — Z955 Presence of coronary angioplasty implant and graft: Secondary | ICD-10-CM | POA: Diagnosis not present

## 2017-09-12 LAB — COMPREHENSIVE METABOLIC PANEL
ALBUMIN: 4.5 g/dL (ref 3.5–5.0)
ALT: 16 U/L (ref 14–54)
AST: 19 U/L (ref 15–41)
Alkaline Phosphatase: 94 U/L (ref 38–126)
Anion gap: 9 (ref 5–15)
BUN: 18 mg/dL (ref 6–20)
CHLORIDE: 103 mmol/L (ref 101–111)
CO2: 28 mmol/L (ref 22–32)
CREATININE: 1.38 mg/dL — AB (ref 0.44–1.00)
Calcium: 10.1 mg/dL (ref 8.9–10.3)
GFR calc non Af Amer: 36 mL/min — ABNORMAL LOW (ref >60)
GFR, EST AFRICAN AMERICAN: 41 mL/min — AB (ref >60)
Glucose, Bld: 102 mg/dL — ABNORMAL HIGH (ref 65–99)
Potassium: 3.6 mmol/L (ref 3.5–5.1)
SODIUM: 140 mmol/L (ref 135–145)
Total Bilirubin: 0.7 mg/dL (ref 0.3–1.2)
Total Protein: 7.9 g/dL (ref 6.5–8.1)

## 2017-09-12 LAB — CBC
HEMATOCRIT: 42.7 % (ref 36.0–46.0)
Hemoglobin: 12.7 g/dL (ref 12.0–15.0)
MCH: 24.5 pg — ABNORMAL LOW (ref 26.0–34.0)
MCHC: 29.7 g/dL — AB (ref 30.0–36.0)
MCV: 82.3 fL (ref 78.0–100.0)
PLATELETS: 234 10*3/uL (ref 150–400)
RBC: 5.19 MIL/uL — ABNORMAL HIGH (ref 3.87–5.11)
RDW: 16.3 % — AB (ref 11.5–15.5)
WBC: 11.3 10*3/uL — ABNORMAL HIGH (ref 4.0–10.5)

## 2017-09-12 LAB — URINALYSIS, ROUTINE W REFLEX MICROSCOPIC
BILIRUBIN URINE: NEGATIVE
Glucose, UA: NEGATIVE mg/dL
Hgb urine dipstick: NEGATIVE
Ketones, ur: NEGATIVE mg/dL
LEUKOCYTES UA: NEGATIVE
NITRITE: NEGATIVE
PH: 5 (ref 5.0–8.0)
PROTEIN: NEGATIVE mg/dL
Specific Gravity, Urine: 1.014 (ref 1.005–1.030)

## 2017-09-12 LAB — LIPASE, BLOOD: LIPASE: 29 U/L (ref 11–51)

## 2017-09-12 MED ORDER — METRONIDAZOLE 500 MG PO TABS
500.0000 mg | ORAL_TABLET | Freq: Once | ORAL | Status: AC
  Administered 2017-09-12: 500 mg via ORAL
  Filled 2017-09-12: qty 1

## 2017-09-12 MED ORDER — CIPROFLOXACIN HCL 250 MG PO TABS
500.0000 mg | ORAL_TABLET | Freq: Once | ORAL | Status: AC
  Administered 2017-09-12: 500 mg via ORAL
  Filled 2017-09-12: qty 2

## 2017-09-12 MED ORDER — METRONIDAZOLE 500 MG PO TABS
500.0000 mg | ORAL_TABLET | Freq: Two times a day (BID) | ORAL | 0 refills | Status: DC
Start: 2017-09-12 — End: 2018-05-18

## 2017-09-12 MED ORDER — CIPROFLOXACIN HCL 500 MG PO TABS: 500.0000 mg | ORAL_TABLET | Freq: Two times a day (BID) | ORAL | 0 refills | Status: DC

## 2017-09-12 NOTE — ED Triage Notes (Addendum)
Patient c/o left lower abd pain. Per patient started yesterday in mid lower abd started yesterday. Per patient nausea but no vomiting. Denies any fevers, urinary symptoms, or diarrhea. Pain worse with movement. Patient states hx of diverticulitis in which this feels similar.

## 2017-09-12 NOTE — ED Provider Notes (Signed)
Jessica Taylor   CSN: [id number] Arrival date & time: 09/12/17  1514     History   Chief Complaint Chief Complaint  Patient presents with  . Abdominal Pain    HPI Jessica Taylor is a 76 y.o. female.  HPI Patient presents with concern of abdominal pain. Pain is in the left lower quadrant. There is associated mild nausea, many, no diarrhea. The pain began over the past 2 or 3 days, has been persistent, worsening in severity. No dysuria, no hematuria. Patient has multiple medical issues, notably diverticulitis.  When she notes that this pain is similar to that which she experienced during her initial episode of diverticulitis earlier this year. She denies other complaints including fever, confusion, chest pain, generalized weakness.  Past Medical History:  Diagnosis Date  . Abdominal aortic aneurysm ([place])   . Acid reflux   . Anemia   . Arthritis    knees , R shoulder - tx /w injection - 11/2014  . Cancer ([place])    basal cell on nose  . Coronary artery disease   . Hypercholesteremia   . Hypertension   . Hypothyroidism   . MI, old 62  . Peripheral arterial disease (HCC)    hhigh-grade ostial bilateral calcified iliac stenosis with claudication  . Tobacco abuse   . Vertigo    when lays on left side.    Patient Active Problem List   Diagnosis Date Noted  . 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 CAD (coronary artery disease)   . Coronary artery disease involving native coronary artery of native heart without angina pectoris   . Unstable angina ([place])   . Chest pain 01/17/2016  . Hematuria 01/17/2016  . Coronary artery disease involving native coronary artery of native heart with unstable angina pectoris ([place])   . Claudication ([place]) 06/27/2015  . Acute renal insufficiency post PCI- SCr1.34 03/08/2015  . STEMI- urgent CFX PCI 03/06/15 03/06/2015  . 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 Hypokalemia 03/06/2015  . Hyperglycemia  03/06/2015  . PAD (peripheral artery disease) ([place]) 03/06/2015  . 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 Elevated troponin 02/10/2015  . GERD (gastroesophageal reflux disease) 02/08/2015  . CAD S/P prior PCI to LAD and RCA 02/08/2015  . Hypothyroidism 02/08/2015  . Hypoxia 02/08/2015  . Status post right partial knee replacement   . Primary osteoarthritis of right knee 02/04/2015  . 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 Atherosclerosis of native coronary artery of native heart without angina pectoris 06/06/2014  . Mixed hyperlipidemia 06/06/2014  . Essential hypertension 06/06/2014    Past Surgical History:  Procedure Laterality Date  . CARDIAC CATHETERIZATION  5 stents  . CARDIAC CATHETERIZATION N/A 01/20/2016   Procedure: Left Heart Cath and Coronary Angiography;  Surgeon: Peter M Martinique, MD;  Location: [place] [facility];  Service: Cardiovascular;  Laterality: N/A;  . CATARACT EXTRACTION W/PHACO Left 05/24/2014   Procedure: CATARACT EXTRACTION PHACO AND INTRAOCULAR LENS PLACEMENT (IOC);  Surgeon: Tonny Branch, MD;  Location: [facility];  Service: Ophthalmology;  Laterality: Left;  CDE:  9.30  . CATARACT EXTRACTION W/PHACO Right 06/18/2014   Procedure: CATARACT EXTRACTION PHACO AND INTRAOCULAR LENS PLACEMENT RIGHT EYE CDE=10.84;  Surgeon: Tonny Branch, MD;  Location: [facility];  Service: Ophthalmology;  Laterality: Right;  . CORONARY ARTERY BYPASS GRAFT N/A 01/24/2016   Procedure: CORONARY ARTERY BYPASS GRAFTING (CABG);  Surgeon: Gaye Pollack, MD;  Location: [facility];  Service: Open Heart Surgery;  Laterality: N/A;  . CORONARY STENT PLACEMENT    . CORONARY STENT PLACEMENT  03/06/15   CFX DES  . EYE SURGERY    . LEFT HEART CATHETERIZATION WITH CORONARY ANGIOGRAM N/A 03/06/2015   Procedure: LEFT HEART CATHETERIZATION WITH CORONARY ANGIOGRAM;  Surgeon: Lorretta Harp, MD;  Location: [facility];  Service: Cardiovascular;  Laterality: N/A;  . PARTIAL KNEE ARTHROPLASTY Right 02/04/2015    Procedure: UNICOMPARTMENTAL KNEE;  Surgeon: Dorna Leitz, MD;  Location: [place];  Service: Orthopedics;  Laterality: Right;  . PERIPHERAL VASCULAR CATHETERIZATION Bilateral 05/13/2015   Procedure: Lower Extremity Angiography;  Surgeon: Lorretta Harp, MD;  Location: [place] [facility];  Service: Cardiovascular;  Laterality: Bilateral;  . PERIPHERAL VASCULAR CATHETERIZATION N/A 05/13/2015   Procedure: Abdominal Aortogram;  Surgeon: Lorretta Harp, MD;  Location: [place] [facility];  Service: Cardiovascular;  Laterality: N/A;  . PERIPHERAL VASCULAR CATHETERIZATION Bilateral 06/27/2015   Procedure: Peripheral Vascular Intervention;  Surgeon: Lorretta Harp, MD;  Location: [place] [facility];  Service: Cardiovascular;  Laterality: Bilateral;  ILIACS  . PERIPHERAL VASCULAR CATHETERIZATION Bilateral 06/27/2015   Procedure: Peripheral Vascular Atherectomy;  Surgeon: Lorretta Harp, MD;  Location: [place] [facility];  Service: Cardiovascular;  Laterality: Bilateral;  . TEE WITHOUT CARDIOVERSION N/A 01/24/2016   Procedure: TRANSESOPHAGEAL ECHOCARDIOGRAM (TEE);  Surgeon: Gaye Pollack, MD;  Location: [place];  Service: Open Heart Surgery;  Laterality: N/A;  . TONSILLECTOMY     age 20  . TUBAL LIGATION      OB History    Gravida Para Term Preterm AB Living   4 4 4     4    SAB TAB Ectopic Multiple Live Births                   Home Medications    Prior to Admission medications   Medication Sig Start Date End Date Taking? Authorizing Provider  aspirin EC 81 MG tablet Take 81 mg by mouth daily.     [provider]  atorvastatin (LIPITOR) 40 MG tablet Take 1 tablet (40 mg total) by mouth daily. 07/27/17   Burtis Junes, NP  clopidogrel (PLAVIX) 75 MG tablet TAKE (1) TABLET BY MOUTH ONCE DAILY. 07/27/17   Burtis Junes, NP  HYDROcodone-acetaminophen (NORCO/VICODIN) 5-325 MG tablet Take 1 tablet by mouth 2 (two) times daily. 04/16/17   [provider]  ketotifen (ZADITOR) 0.025 %  ophthalmic solution Place 1 drop into both eyes 2 (two) times daily.    [provider]  levothyroxine (SYNTHROID, LEVOTHROID) 50 MCG tablet Take 50 mcg by mouth daily before breakfast.    [provider]  metoprolol tartrate (LOPRESSOR) 25 MG tablet Take 1 tablet (25 mg total) by mouth 2 (two) times daily. 07/27/17   Burtis Junes, NP  pantoprazole (PROTONIX) 40 MG tablet  05/17/17   [provider]    Family History Family History  Problem Relation Age of Onset  . Cancer Mother 28  . Cancer Father 68  . Hypertension Maternal Grandmother   . Stroke Maternal Grandfather   . Hypertension Son   . Heart attack Neg Hx     Social History Social History  Substance Use Topics  . Smoking status: Former Smoker    Packs/day: 1.50    Years: 42.00    Types: Cigarettes    Quit date: 05/19/1999  . Smokeless tobacco: Never Used  . Alcohol use No  Allergies   Shrimp [shellfish allergy]   Review of Systems Review of Systems  Constitutional:       Per HPI, otherwise negative  HENT:       Per HPI, otherwise negative  Respiratory:       Per HPI, otherwise negative  Cardiovascular:       Per HPI, otherwise negative  Gastrointestinal: Negative for vomiting.  Endocrine:       Negative aside from HPI  Genitourinary:       Neg aside from HPI   Musculoskeletal:       Per HPI, otherwise negative  Skin: Negative.   Neurological: Negative for syncope.     Physical Exam Updated Vital Signs BP (!) 164/86 (BP Location: Left Arm)   Pulse 95   Temp 98.6 F (37 C) (Oral)   Resp 18   Ht 5\' 3"  (1.6 m)   Wt 72.6 kg (160 lb)   SpO2 100%   BMI 28.34 kg/m   Physical Exam  Constitutional: She is oriented to person, place, and time. She appears well-developed and well-nourished. No distress.  HENT:  Head: Normocephalic and atraumatic.  Eyes: Conjunctivae and EOM are normal.  Cardiovascular: Normal rate and regular rhythm.   Pulmonary/Chest: Effort normal  and breath sounds normal. No stridor. No respiratory distress.  Abdominal: She exhibits no distension.  Minimal pain in the lower abdomen, no guarding, no rebound  Musculoskeletal: She exhibits no edema.  Neurological: She is alert and oriented to person, place, and time. No cranial nerve deficit.  Skin: Skin is warm and dry.  Psychiatric: She has a normal mood and affect.  Nursing Taylor and vitals reviewed.    ED Treatments / Results  Labs (all labs ordered are listed, but only abnormal results are displayed) Labs Reviewed  COMPREHENSIVE METABOLIC PANEL - Abnormal; Notable for the following:       Result Value   Glucose, Bld 102 (*)    Creatinine, Ser 1.38 (*)    GFR calc non Af Amer 36 (*)    GFR calc Af Amer 41 (*)    All other components within normal limits  CBC - Abnormal; Notable for the following:    WBC 11.3 (*)    RBC 5.19 (*)    MCH 24.5 (*)    MCHC 29.7 (*)    RDW 16.3 (*)    All other components within normal limits  LIPASE, BLOOD  URINALYSIS, ROUTINE W REFLEX MICROSCOPIC     Procedures Procedures (including critical care time)  Medications Ordered in ED  Cipro, Flagyl  Initial Impression / Assessment and Plan / ED Course  I have reviewed the triage vital signs and the nursing notes.  Pertinent labs & imaging results that were available during my care of the patient were reviewed by me and considered in my medical decision making (see chart for details).  Female presents with left lower quadrant abdominal pain. Patient has leukocytosis. Given the patient's description of pain as similar to prior episode of diverticulitis, physical exam findings, lab abnormalities, there is suspicion for this entity as well. Absent guarding, peritonitis, there is no evidence for other acute abdominal processes, no evidence for bacteremia or sepsis. Patient was started on her antibiotic regimen, will follow up with gastroenterology.  We discussed risks and benefits of  empiric therapy versus additional imaging, and patient and daughter aware, agreeable.  Final Clinical Impressions(s) / ED Diagnoses  Diverticulitis   Carmin Muskrat, MD 09/12/17 [phone]

## 2018-05-06 ENCOUNTER — Other Ambulatory Visit: Payer: Self-pay | Admitting: Nurse Practitioner

## 2018-05-13 ENCOUNTER — Telehealth: Payer: Self-pay

## 2018-05-13 MED ORDER — NITROGLYCERIN 0.4 MG SL SUBL: 0.4000 mg | SUBLINGUAL_TABLET | SUBLINGUAL | 99 refills | Status: DC | PRN

## 2018-05-13 NOTE — Telephone Encounter (Signed)
Pt called complaining about mild chest pain and would like her nitroglycerin refill. This medication is no longer on her list.

## 2018-05-13 NOTE — Telephone Encounter (Signed)
Spoke with patient who is c/o chest pain that is mostly below her collar bone with activity such as cleaning or yard work.  It lasts approximately 15 mins and resolves with rest.   She denies any SOB/ N/V or radiation.  She has also had some right sided neck pain that has occurred several times.   Pt is asking for a refill of her SL Ntg to be sent into [place].  She is aware of how to use it if necessary.  She is aware of when to report to ED for further evaluation/treatment.  Scheduled appt here for eval 6/26 at 8:20.

## 2018-05-18 ENCOUNTER — Ambulatory Visit: Payer: Medicare Other | Admitting: Cardiology

## 2018-05-18 ENCOUNTER — Encounter: Payer: Self-pay | Admitting: Cardiology

## 2018-05-18 ENCOUNTER — Encounter (INDEPENDENT_AMBULATORY_CARE_PROVIDER_SITE_OTHER): Payer: Self-pay

## 2018-05-18 VITALS — BP 136/98 | HR 66 | Ht 63.0 in | Wt 161.4 lb

## 2018-05-18 DIAGNOSIS — I739 Peripheral vascular disease, unspecified: Secondary | ICD-10-CM | POA: Diagnosis not present

## 2018-05-18 DIAGNOSIS — I10 Essential (primary) hypertension: Secondary | ICD-10-CM | POA: Diagnosis not present

## 2018-05-18 DIAGNOSIS — R072 Precordial pain: Secondary | ICD-10-CM

## 2018-05-18 DIAGNOSIS — Z951 Presence of aortocoronary bypass graft: Secondary | ICD-10-CM

## 2018-05-18 NOTE — Progress Notes (Signed)
Cardiology Office Note    Date:  05/18/2018   ID:  Jessica Taylor, Jessica Taylor Mar 03, 1941, MRN [id number]  PCP:  Jessica Contras, MD  Cardiologist:   Candee Furbish, MD     History of Present Illness:  Jessica Taylor is a 77 y.o. female with coronary artery disease status post CABG 3 LIMA to LAD, SVG to ramus, SVG to PDA on 01/24/16. Previous PCI to LAD and RCA as well as PCI of occluded left circumflex after myocardial infarction in 02/2015 with recurrent anginal symptoms. She underwent bypass surgery. Had MI 1 week after knee repair.   She has also had bilateral iliac PCI and stenting on 06/2015 by Dr. Gwenlyn Found. Stage III chronic kidney disease as well as hyperlipidemia. She had been on Plavix because of non-ST elevation MI (troponin peaked at 41 on 03/06/15 prior to her bypass surgery) and peripheral vascular disease.  After her bypass surgery she had an occasional twinge in her chest when she sat up from the bed, small pleural effusion.  05/18/2018 she comes in today complaining of some chest discomfort that started last Friday up around collar bone, pain and pressure and pain in neck. Lasted 9 hours. ASA every 3 hrs. ?positional. but no pain in the past 2 days, no significant shortness of breath back or jaw pain.  She has had some neck discomfort as well.  She had a cortisone injection of knee approximately 1 week ago.  She is also had some excessive fatigue.  No syncope bleeding orthopnea PND.   Past Medical History:  Diagnosis Date  . Abdominal aortic aneurysm ([place])   . Acid reflux   . Anemia   . Arthritis    knees , R shoulder - tx /w injection - 11/2014  . Cancer ([place])    basal cell on nose  . Coronary artery disease   . Hypercholesteremia   . Hypertension   . Hypothyroidism   . MI, old 78  . Peripheral arterial disease (HCC)    hhigh-grade ostial bilateral calcified iliac stenosis with claudication  . Tobacco abuse   . Vertigo    when lays on left side.    Past Surgical History:    Procedure Laterality Date  . CARDIAC CATHETERIZATION  5 stents  . CARDIAC CATHETERIZATION N/A 01/20/2016   Procedure: Left Heart Cath and Coronary Angiography;  Surgeon: Peter M Martinique, MD;  Location: [place] [facility];  Service: Cardiovascular;  Laterality: N/A;  . CATARACT EXTRACTION W/PHACO Left 05/24/2014   Procedure: CATARACT EXTRACTION PHACO AND INTRAOCULAR LENS PLACEMENT (IOC);  Surgeon: Tonny Branch, MD;  [facility];  Service: Ophthalmology;  Laterality: Left;  CDE:  9.30  . CATARACT EXTRACTION W/PHACO Right 06/18/2014   Procedure: CATARACT EXTRACTION PHACO AND INTRAOCULAR LENS PLACEMENT RIGHT EYE CDE=10.84;  Surgeon: Tonny Branch, MD;  [facility];  Service: Ophthalmology;  Laterality: Right;  . CORONARY ARTERY BYPASS GRAFT N/A 01/24/2016   Procedure: CORONARY ARTERY BYPASS GRAFTING (CABG);  Surgeon: Gaye Pollack, MD;  Location: [place];  Service: Open Heart Surgery;  Laterality: N/A;  . CORONARY STENT PLACEMENT    . CORONARY STENT PLACEMENT  03/06/15   CFX DES  . EYE SURGERY    . LEFT HEART CATHETERIZATION WITH CORONARY ANGIOGRAM N/A 03/06/2015   Procedure: LEFT HEART CATHETERIZATION WITH CORONARY ANGIOGRAM;  Surgeon: Lorretta Harp, MD;  Location: [facility];  Service: Cardiovascular;  Laterality: N/A;  . PARTIAL KNEE ARTHROPLASTY Right 02/04/2015   Procedure: UNICOMPARTMENTAL KNEE;  Surgeon: Dorna Leitz, MD;  Location: [place];  Service: Orthopedics;  Laterality: Right;  . PERIPHERAL VASCULAR CATHETERIZATION Bilateral 05/13/2015   Procedure: Lower Extremity Angiography;  Surgeon: Lorretta Harp, MD;  Location: [place] [facility];  Service: Cardiovascular;  Laterality: Bilateral;  . PERIPHERAL VASCULAR CATHETERIZATION N/A 05/13/2015   Procedure: Abdominal Aortogram;  Surgeon: Lorretta Harp, MD;  Location: [place] [facility];  Service: Cardiovascular;  Laterality: N/A;  . PERIPHERAL VASCULAR CATHETERIZATION Bilateral 06/27/2015   Procedure: Peripheral Vascular Intervention;   Surgeon: Lorretta Harp, MD;  Location: [place] [facility];  Service: Cardiovascular;  Laterality: Bilateral;  ILIACS  . PERIPHERAL VASCULAR CATHETERIZATION Bilateral 06/27/2015   Procedure: Peripheral Vascular Atherectomy;  Surgeon: Lorretta Harp, MD;  Location: [place] [facility];  Service: Cardiovascular;  Laterality: Bilateral;  . TEE WITHOUT CARDIOVERSION N/A 01/24/2016   Procedure: TRANSESOPHAGEAL ECHOCARDIOGRAM (TEE);  Surgeon: Gaye Pollack, MD;  Location: [place];  Service: Open Heart Surgery;  Laterality: N/A;  . TONSILLECTOMY     age 49  . TUBAL LIGATION      Current Medications: Outpatient Medications Prior to Visit  Medication Sig Dispense Refill  . aspirin EC 81 MG tablet Take 81 mg by mouth daily.     Marland Kitchen atorvastatin (LIPITOR) 40 MG tablet TAKE (1) TABLET BY MOUTH ONCE DAILY. 90 tablet 3  . clopidogrel (PLAVIX) 75 MG tablet TAKE (1) TABLET BY MOUTH ONCE DAILY. 30 tablet 11  . HYDROcodone-acetaminophen (NORCO/VICODIN) 5-325 MG tablet Take 1 tablet by mouth 2 (two) times daily.    Marland Kitchen levothyroxine (SYNTHROID, LEVOTHROID) 50 MCG tablet Take 50 mcg by mouth daily before breakfast.    . metoprolol tartrate (LOPRESSOR) 25 MG tablet Take 1 tablet (25 mg total) by mouth 2 (two) times daily. 60 tablet 11  . nitroGLYCERIN (NITROSTAT) 0.4 MG SL tablet Place 1 tablet (0.4 mg total) under the tongue every 5 (five) minutes as needed for chest pain. 25 tablet prn  . pantoprazole (PROTONIX) 40 MG tablet Take 40 mg by mouth 2 (two) times daily.     . ciprofloxacin (CIPRO) 500 MG tablet Take 1 tablet (500 mg total) by mouth 2 (two) times daily. (Patient not taking: Reported on 05/18/2018) 14 tablet 0  . ketotifen (ZADITOR) 0.025 % ophthalmic solution Place 1 drop into both eyes 2 (two) times daily.    . metroNIDAZOLE (FLAGYL) 500 MG tablet Take 1 tablet (500 mg total) by mouth 2 (two) times daily. (Patient not taking: Reported on 05/18/2018) 14 tablet 0   Facility-Administered Medications Prior to  Visit  Medication Dose Route Frequency Provider Last Rate Last Dose  . diphenhydrAMINE (BENADRYL) injection 25 mg  25 mg Intravenous Once Lorretta Harp, MD      . famotidine (PEPCID) 20 mg in sodium chloride 0.9 % 50 mL IVPB  20 mg Intravenous Once Lorretta Harp, MD         Allergies:   Shrimp Dia Sitter allergy]   Social History   Socioeconomic History  . Marital status: Married    Spouse name: Not on file  . Number of children: Not on file  . Years of education: Not on file  . Highest education level: Not on file  Occupational History  . Not on file  Social Needs  . Financial resource strain: Not on file  . Food insecurity:    Worry: Not on file    Inability: Not on file  . Transportation needs:    Medical: Not  on file    Non-medical: Not on file  Tobacco Use  . Smoking status: Former Smoker    Packs/day: 1.50    Years: 42.00    Pack years: 63.00    Types: Cigarettes    Last attempt to quit: 05/19/1999    Years since quitting: 19.0  . Smokeless tobacco: Never Used  Substance and Sexual Activity  . Alcohol use: No  . Drug use: No  . Sexual activity: Yes    Birth control/protection: Surgical  Lifestyle  . Physical activity:    Days per week: Not on file    Minutes per session: Not on file  . Stress: Not on file  Relationships  . Social connections:    Talks on phone: Not on file    Gets together: Not on file    Attends religious service: Not on file    Active member of club or organization: Not on file    Attends meetings of clubs or organizations: Not on file    Relationship status: Not on file  Other Topics Concern  . Not on file  Social History Narrative  . Not on file     Family History:  The patient's family history includes Cancer (age of onset: 69) in her father; Cancer (age of onset: 106) in her mother; Hypertension in her maternal grandmother and son; Stroke in her maternal grandfather.   ROS:   Please see the history of present illness.    Osteoarthritis of her knees Review of Systems  All other systems reviewed and are negative.     PHYSICAL EXAM:   VS:  BP (!) 136/98   Pulse 66   Ht 5\' 3"  (1.6 m)   Wt 161 lb 6.4 oz (73.2 kg)   BMI 28.59 kg/m    GEN: Well nourished, well developed, in no acute distress  HEENT: normal  Neck: no JVD, carotid bruits, or masses Cardiac: RRR; no murmurs, rubs, or gallops,no edema  Respiratory:  clear to auscultation bilaterally, normal work of breathing GI: soft, nontender, nondistended, + BS MS: no deformity or atrophy  Skin: warm and dry, no rash Neuro:  Alert and Oriented x 3, Strength and sensation are intact Psych: euthymic mood, full affect   Wt Readings from Last 3 Encounters:  05/18/18 161 lb 6.4 oz (73.2 kg)  09/12/17 160 lb (72.6 kg)  07/27/17 167 lb (75.8 kg)      Studies/Labs Reviewed:   EKG: 05/18/2018 personally viewed-sinus rhythm 66 with nonspecific interventricular conduction delay.  No significant T wave or ischemic changes.  Recent Labs: 09/12/2017: ALT 16; BUN 18; Creatinine, Ser 1.38; Hemoglobin 12.7; Platelets 234; Potassium 3.6; Sodium 140   Lipid Panel    Component Value Date/Time   CHOL 188 07/13/2016 1049   TRIG 259 (H) 07/13/2016 1049   HDL 38 (L) 07/13/2016 1049   CHOLHDL 4.9 07/13/2016 1049   VLDL 52 (H) 07/13/2016 1049   LDLCALC 98 07/13/2016 1049    Additional studies/ records that were reviewed today include:  Prior hospital records, lab work, catheterization reviewed    ASSESSMENT:    1. S/P CABG (coronary artery bypass graft)   2. Essential hypertension   3. PAD (peripheral artery disease) ([place])   4. Precordial pain      PLAN:  In order of problems listed above:  Upper chest pain -Fairly atypical.  Collarbone-like discomfort.  Now improved.  No exertional component.  Lasted for several hours duration.  Resolved with aspirin.  Continue to  monitor for any worsening symptoms.  If symptoms do worsen, consider further testing  in the future.  Coronary artery disease status post bypass surgery 02/2015 -LIMA to LAD, SVG to ramus, SVG to PDA-Dr. Cyndia Bent - See above  Non-ST elevation myocardial infarction -On both Plavix as well as aspirin. PAD  Hyperlipidemia -Continuing with atorvastatin 40 mg once a day. Last LDL 88 is made  PVD  - Prior bilateral iliac stenting. Dr. Gwenlyn Found.Great distal pulses.  Continue with Plavix and aspirin  Chronic kidney disease stage III -Creatinine in the 1-1.4 range.  6 months APP, 12 months me  Medication Adjustments/Labs and Tests Ordered: Current medicines are reviewed at length with the patient today.  Concerns regarding medicines are outlined above.  Medication changes, Labs and Tests ordered today are listed in the Patient Instructions below. Patient Instructions  Medication Instructions:  The current medical regimen is effective;  continue present plan and medications.  Follow-Up: Follow up in 6 months with Cecilie Kicks, NP.  You will receive a letter in the mail 2 months before you are due.  Please call us when you receive this letter to schedule your follow up appointment.  Follow up in 1 year with Dr. Marlou Porch.  You will receive a letter in the mail 2 months before you are due.  Please call us when you receive this letter to schedule your follow up appointment.  If you need a refill on your cardiac medications before your next appointment, please call your pharmacy.  Thank you for choosing [facility]!!        Signed, Candee Furbish, MD  05/18/2018 9:05 AM    [facility] [place], [address], [place]  [address] Phone: [phone]; Fax: [phone]

## 2018-05-18 NOTE — Patient Instructions (Addendum)
Medication Instructions:  The current medical regimen is effective;  continue present plan and medications.  Follow-Up: Follow up in 6 months with Laura Ingold, NP.  You will receive a letter in the mail 2 months before you are due.  Please call us when you receive this letter to schedule your follow up appointment.  Follow up in 1 year with Dr. Skains.  You will receive a letter in the mail 2 months before you are due.  Please call us when you receive this letter to schedule your follow up appointment.  If you need a refill on your cardiac medications before your next appointment, please call your pharmacy.  Thank you for choosing [facility]!!     

## 2018-06-04 ENCOUNTER — Other Ambulatory Visit: Payer: Self-pay | Admitting: Nurse Practitioner

## 2018-07-27 ENCOUNTER — Ambulatory Visit: Payer: Medicare Other | Admitting: Cardiology

## 2018-08-03 ENCOUNTER — Other Ambulatory Visit: Payer: Self-pay | Admitting: Nurse Practitioner

## 2018-08-04 ENCOUNTER — Encounter (HOSPITAL_COMMUNITY): Payer: Self-pay | Admitting: Emergency Medicine

## 2018-08-04 ENCOUNTER — Other Ambulatory Visit: Payer: Self-pay

## 2018-08-04 ENCOUNTER — Emergency Department (HOSPITAL_COMMUNITY)
Admission: EM | Admit: 2018-08-04 | Discharge: 2018-08-04 | Disposition: A | Discharge status: Home / Self Care | Payer: Medicare Other | Attending: Emergency Medicine | Admitting: Emergency Medicine

## 2018-08-04 DIAGNOSIS — I739 Peripheral vascular disease, unspecified: Secondary | ICD-10-CM | POA: Diagnosis not present

## 2018-08-04 DIAGNOSIS — Z79899 Other long term (current) drug therapy: Secondary | ICD-10-CM | POA: Diagnosis not present

## 2018-08-04 DIAGNOSIS — Z87891 Personal history of nicotine dependence: Secondary | ICD-10-CM | POA: Diagnosis not present

## 2018-08-04 DIAGNOSIS — Z7982 Long term (current) use of aspirin: Secondary | ICD-10-CM | POA: Diagnosis not present

## 2018-08-04 DIAGNOSIS — R319 Hematuria, unspecified: Secondary | ICD-10-CM | POA: Diagnosis not present

## 2018-08-04 DIAGNOSIS — I10 Essential (primary) hypertension: Secondary | ICD-10-CM | POA: Diagnosis not present

## 2018-08-04 DIAGNOSIS — N390 Urinary tract infection, site not specified: Secondary | ICD-10-CM

## 2018-08-04 LAB — BASIC METABOLIC PANEL
Anion gap: 4 — ABNORMAL LOW (ref 5–15)
BUN: 22 mg/dL (ref 8–23)
CO2: 28 mmol/L (ref 22–32)
CREATININE: 1.43 mg/dL — AB (ref 0.44–1.00)
Calcium: 9.1 mg/dL (ref 8.9–10.3)
Chloride: 110 mmol/L (ref 98–111)
GFR calc Af Amer: 39 mL/min — ABNORMAL LOW (ref >60)
GFR calc non Af Amer: 34 mL/min — ABNORMAL LOW (ref >60)
Glucose, Bld: 106 mg/dL — ABNORMAL HIGH (ref 70–99)
POTASSIUM: 4.1 mmol/L (ref 3.5–5.1)
SODIUM: 142 mmol/L (ref 135–145)

## 2018-08-04 LAB — CBC WITH DIFFERENTIAL/PLATELET
Basophils Absolute: 0.1 10*3/uL (ref 0.0–0.1)
Basophils Relative: 1 %
EOS PCT: 3 %
Eosinophils Absolute: 0.2 10*3/uL (ref 0.0–0.7)
HCT: 33.5 % — ABNORMAL LOW (ref 36.0–46.0)
Hemoglobin: 9.5 g/dL — ABNORMAL LOW (ref 12.0–15.0)
LYMPHS ABS: 1.7 10*3/uL (ref 0.7–4.0)
Lymphocytes Relative: 30 %
MCH: 22.5 pg — AB (ref 26.0–34.0)
MCHC: 28.4 g/dL — ABNORMAL LOW (ref 30.0–36.0)
MCV: 79.4 fL (ref 78.0–100.0)
Monocytes Absolute: 0.4 10*3/uL (ref 0.1–1.0)
Monocytes Relative: 7 %
Neutro Abs: 3.4 10*3/uL (ref 1.7–7.7)
Neutrophils Relative %: 59 %
PLATELETS: 210 10*3/uL (ref 150–400)
RBC: 4.22 MIL/uL (ref 3.87–5.11)
RDW: 18 % — ABNORMAL HIGH (ref 11.5–15.5)
WBC: 5.8 10*3/uL (ref 4.0–10.5)

## 2018-08-04 LAB — URINALYSIS, ROUTINE W REFLEX MICROSCOPIC
Glucose, UA: NEGATIVE mg/dL
Ketones, ur: NEGATIVE mg/dL
Nitrite: POSITIVE — AB
Protein, ur: 100 mg/dL — AB
Specific Gravity, Urine: 1.025 (ref 1.005–1.030)
pH: 5.5 (ref 5.0–8.0)

## 2018-08-04 LAB — URINALYSIS, MICROSCOPIC (REFLEX)
RBC / HPF: >50 RBC/hpf (ref 0–5)
Squamous Epithelial / LPF: NONE SEEN (ref 0–5)

## 2018-08-04 MED ORDER — CEPHALEXIN 500 MG PO CAPS: 500.0000 mg | ORAL_CAPSULE | Freq: Four times a day (QID) | ORAL | 0 refills | Status: DC

## 2018-08-04 NOTE — ED Provider Notes (Signed)
[facility] EMERGENCY DEPARTMENT Provider Note   CSN: [id number] Arrival date & time: 08/04/18  0734     History   Chief Complaint Chief Complaint  Patient presents with  . Hematuria    HPI Jessica Taylor is a 77 y.o. female.  The history is provided by the patient. No language interpreter was used.  Hematuria  This is a new problem. The current episode started yesterday. The problem occurs constantly. The problem has been gradually worsening. Pertinent negatives include no chest pain and no abdominal pain. Nothing aggravates the symptoms. Nothing relieves the symptoms. She has tried nothing for the symptoms. The treatment provided no relief.  Pt reports she noticed blood in her urine starting yesterday.  Pt denies back pain.  No fever, no chills  Past Medical History:  Diagnosis Date  . Abdominal aortic aneurysm ([place])   . Acid reflux   . Anemia   . Arthritis    knees , R shoulder - tx /w injection - 11/2014  . Cancer ([place])    basal cell on nose  . Coronary artery disease   . Hypercholesteremia   . Hypertension   . Hypothyroidism   . MI, old 67  . Peripheral arterial disease (HCC)    hhigh-grade ostial bilateral calcified iliac stenosis with claudication  . Tobacco abuse   . Vertigo    when lays on left side.    Patient Active Problem List   Diagnosis Date Noted  . 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 CAD (coronary artery disease)   . Coronary artery disease involving native coronary artery of native heart without angina pectoris   . Unstable angina ([place])   . Chest pain 01/17/2016  . Hematuria 01/17/2016  . Coronary artery disease involving native coronary artery of native heart with unstable angina pectoris ([place])   . Claudication ([place]) 06/27/2015  . Acute renal insufficiency post PCI- SCr1.34 03/08/2015  . STEMI- urgent CFX PCI 03/06/15 03/06/2015  . 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 Hypokalemia 03/06/2015  . Hyperglycemia 03/06/2015  . PAD  (peripheral artery disease) ([place]) 03/06/2015  . 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 Elevated troponin 02/10/2015  . GERD (gastroesophageal reflux disease) 02/08/2015  . CAD S/P prior PCI to LAD and RCA 02/08/2015  . Hypothyroidism 02/08/2015  . Hypoxia 02/08/2015  . Status post right partial knee replacement   . Primary osteoarthritis of right knee 02/04/2015  . 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 Atherosclerosis of native coronary artery of native heart without angina pectoris 06/06/2014  . Mixed hyperlipidemia 06/06/2014  . Essential hypertension 06/06/2014    Past Surgical History:  Procedure Laterality Date  . CARDIAC CATHETERIZATION  5 stents  . CARDIAC CATHETERIZATION N/A 01/20/2016   Procedure: Left Heart Cath and Coronary Angiography;  Surgeon: Peter M Martinique, MD;  Location: [place] CV LAB;  Service: Cardiovascular;  Laterality: N/A;  . CATARACT EXTRACTION W/PHACO Left 05/24/2014   Procedure: CATARACT EXTRACTION PHACO AND INTRAOCULAR LENS PLACEMENT (IOC);  Surgeon: Tonny Branch, MD;  Location: [facility];  Service: Ophthalmology;  Laterality: Left;  CDE:  9.30  . CATARACT EXTRACTION W/PHACO Right 06/18/2014   Procedure: CATARACT EXTRACTION PHACO AND INTRAOCULAR LENS PLACEMENT RIGHT EYE CDE=10.84;  Surgeon: Tonny Branch, MD;  Location: [facility];  Service: Ophthalmology;  Laterality: Right;  . CORONARY ARTERY BYPASS GRAFT N/A 01/24/2016   Procedure: CORONARY ARTERY BYPASS GRAFTING (CABG);  Surgeon: Gaye Pollack, MD;  Location: [facility]  OR;  Service: Open Heart Surgery;  Laterality: N/A;  . CORONARY STENT PLACEMENT    . CORONARY STENT PLACEMENT  03/06/15   CFX DES  . EYE SURGERY    . LEFT HEART CATHETERIZATION WITH CORONARY ANGIOGRAM N/A 03/06/2015   Procedure: LEFT HEART CATHETERIZATION WITH CORONARY ANGIOGRAM;  Surgeon: Lorretta Harp, MD;  Location: [facility] CATH LAB;  Service: Cardiovascular;  Laterality: N/A;  . PARTIAL KNEE ARTHROPLASTY Right 02/04/2015   Procedure: UNICOMPARTMENTAL  KNEE;  Surgeon: Dorna Leitz, MD;  Location: [place];  Service: Orthopedics;  Laterality: Right;  . PERIPHERAL VASCULAR CATHETERIZATION Bilateral 05/13/2015   Procedure: Lower Extremity Angiography;  Surgeon: Lorretta Harp, MD;  Location: [place] CV LAB;  Service: Cardiovascular;  Laterality: Bilateral;  . PERIPHERAL VASCULAR CATHETERIZATION N/A 05/13/2015   Procedure: Abdominal Aortogram;  Surgeon: Lorretta Harp, MD;  Location: [place] CV LAB;  Service: Cardiovascular;  Laterality: N/A;  . PERIPHERAL VASCULAR CATHETERIZATION Bilateral 06/27/2015   Procedure: Peripheral Vascular Intervention;  Surgeon: Lorretta Harp, MD;  Location: [place] CV LAB;  Service: Cardiovascular;  Laterality: Bilateral;  ILIACS  . PERIPHERAL VASCULAR CATHETERIZATION Bilateral 06/27/2015   Procedure: Peripheral Vascular Atherectomy;  Surgeon: Lorretta Harp, MD;  Location: [place] [facility];  Service: Cardiovascular;  Laterality: Bilateral;  . TEE WITHOUT CARDIOVERSION N/A 01/24/2016   Procedure: TRANSESOPHAGEAL ECHOCARDIOGRAM (TEE);  Surgeon: Gaye Pollack, MD;  Location: [place];  Service: Open Heart Surgery;  Laterality: N/A;  . TONSILLECTOMY     age 59  . TUBAL LIGATION       OB History    Gravida  4   Para  4   Term  4   Preterm      AB      Living  4     SAB      TAB      Ectopic      Multiple      Live Births               Home Medications    Prior to Admission medications   Medication Sig Start Date End Date Taking? Authorizing Provider  aspirin EC 81 MG tablet Take 81 mg by mouth daily.    Yes [provider]  atorvastatin (LIPITOR) 40 MG tablet TAKE (1) TABLET BY MOUTH ONCE DAILY. 05/09/18  Yes Burtis Junes, NP  clopidogrel (PLAVIX) 75 MG tablet TAKE (1) TABLET BY MOUTH ONCE DAILY. 08/03/18  Yes Burtis Junes, NP  levothyroxine (SYNTHROID, LEVOTHROID) 50 MCG tablet Take 50 mcg by mouth daily before breakfast.   Yes [provider]  metoprolol  tartrate (LOPRESSOR) 25 MG tablet TAKE 1 TABLET BY MOUTH TWICE DAILY. 06/06/18  Yes Burtis Junes, NP  nitroGLYCERIN (NITROSTAT) 0.4 MG SL tablet Place 1 tablet (0.4 mg total) under the tongue every 5 (five) minutes as needed for chest pain. 05/13/18 08/11/18 Yes Jerline Pain, MD  pantoprazole (PROTONIX) 40 MG tablet Take 40 mg by mouth 2 (two) times daily.  05/17/17  Yes [provider]  HYDROcodone-acetaminophen (NORCO/VICODIN) 5-325 MG tablet Take 1 tablet by mouth 2 (two) times daily. 04/16/17   [provider]    Family History Family History  Problem Relation Age of Onset  . Cancer Mother 31  . Cancer Father 32  . Hypertension Maternal Grandmother   . Stroke Maternal Grandfather   . Hypertension Son   . Heart attack Neg Hx     Social History  Social History   Tobacco Use  . Smoking status: Former Smoker    Packs/day: 1.50    Years: 42.00    Pack years: 63.00    Types: Cigarettes    Last attempt to quit: 05/19/1999    Years since quitting: 19.2  . Smokeless tobacco: Never Used  Substance Use Topics  . Alcohol use: No  . Drug use: No     Allergies   Shrimp [shellfish allergy]   Review of Systems Review of Systems  Cardiovascular: Negative for chest pain.  Gastrointestinal: Negative for abdominal pain.  Genitourinary: Positive for hematuria.  All other systems reviewed and are negative.    Physical Exam Updated Vital Signs BP (!) 174/74   Pulse (!) 58   Temp 97.9 F (36.6 C) (Oral)   Resp 16   Ht 5\' 3"  (1.6 m)   Wt 72.6 kg   SpO2 99%   BMI 28.34 kg/m   Physical Exam  Constitutional: She is oriented to person, place, and time. She appears well-developed and well-nourished.  HENT:  Head: Normocephalic.  Right Ear: External ear normal.  Left Ear: External ear normal.  Mouth/Throat: Oropharynx is clear and moist.  Eyes: Pupils are equal, round, and reactive to light. EOM are normal.  Neck: Normal range of motion.  Cardiovascular:  Normal rate and regular rhythm.  Pulmonary/Chest: Effort normal.  Abdominal: Soft. She exhibits no distension.  Musculoskeletal: Normal range of motion.  Neurological: She is alert and oriented to person, place, and time.  Skin: Skin is warm.  Psychiatric: She has a normal mood and affect.  Nursing note and vitals reviewed.    ED Treatments / Results  Labs (all labs ordered are listed, but only abnormal results are displayed) Labs Reviewed  URINALYSIS, ROUTINE W REFLEX MICROSCOPIC - Abnormal; Notable for the following components:      Result Value   Color, Urine BROWN (*)    APPearance CLOUDY (*)    Hgb urine dipstick LARGE (*)    Bilirubin Urine MODERATE (*)    Protein, ur 100 (*)    Nitrite POSITIVE (*)    Leukocytes, UA TRACE (*)    All other components within normal limits  CBC WITH DIFFERENTIAL/PLATELET - Abnormal; Notable for the following components:   Hemoglobin 9.5 (*)    HCT 33.5 (*)    MCH 22.5 (*)    MCHC 28.4 (*)    RDW 18.0 (*)    All other components within normal limits  BASIC METABOLIC PANEL - Abnormal; Notable for the following components:   Glucose, Bld 106 (*)    Creatinine, Ser 1.43 (*)    GFR calc non Af Amer 34 (*)    GFR calc Af Amer 39 (*)    Anion gap 4 (*)    All other components within normal limits  URINALYSIS, MICROSCOPIC (REFLEX) - Abnormal; Notable for the following components:   Bacteria, UA MANY (*)    All other components within normal limits    EKG None  Radiology No results found.  Procedures Procedures (including critical care time)  Medications Ordered in ED Medications - No data to display   Initial Impression / Assessment and Plan / ED Course  I have reviewed the triage vital signs and the nursing notes.  Pertinent labs & imaging results that were available during my care of the patient were reviewed by me and considered in my medical decision making (see chart for details).  Clinical Course as of Aug 04 1049  Thu  Aug 04, 2018  1048 CBC with Differential/Platelet(!) [LS]  2025 CBC with Differential/Platelet(!) [LS]    Clinical Course User Index [LS] Fransico Meadow, PA-C    MDM  Ua shows greater than 50 rbc's  Many bacteria.   Pt reports a history of kidney abnormality on labs.    Final Clinical Impressions(s) / ED Diagnoses   Final diagnoses:  Hematuria, unspecified type  Urinary tract infection without hematuria, site unspecified    ED Discharge Orders         Ordered    cephALEXin (KEFLEX) 500 MG capsule  4 times daily     08/04/18 1103        An After Visit Summary was printed and given to the patient.    Fransico Meadow, [place] 08/04/18 1104    Virgel Manifold, MD 08/04/18 1536

## 2018-08-04 NOTE — ED Notes (Signed)
Pt was informed that we need a urine sample. Pt states that she can not urinate a this time.

## 2018-08-04 NOTE — ED Triage Notes (Signed)
PT c/o hematuria starting yesterday with some discomfort to her lower abdomen.

## 2018-08-04 NOTE — Discharge Instructions (Addendum)
See your Physician for recheck next week.  Return if symptoms worsen or change

## 2018-08-06 LAB — URINE CULTURE

## 2018-09-10 ENCOUNTER — Emergency Department (HOSPITAL_COMMUNITY)
Admission: EM | Admit: 2018-09-10 | Discharge: 2018-09-10 | Disposition: A | Discharge status: Home / Self Care | Payer: Medicare Other | Source: Home / Self Care | Attending: Emergency Medicine | Admitting: Emergency Medicine

## 2018-09-10 ENCOUNTER — Encounter (HOSPITAL_COMMUNITY): Payer: Self-pay | Admitting: *Deleted

## 2018-09-10 ENCOUNTER — Other Ambulatory Visit: Payer: Self-pay

## 2018-09-10 DIAGNOSIS — E039 Hypothyroidism, unspecified: Secondary | ICD-10-CM

## 2018-09-10 DIAGNOSIS — A419 Sepsis, unspecified organism: Secondary | ICD-10-CM | POA: Diagnosis not present

## 2018-09-10 DIAGNOSIS — Z951 Presence of aortocoronary bypass graft: Secondary | ICD-10-CM | POA: Insufficient documentation

## 2018-09-10 DIAGNOSIS — I10 Essential (primary) hypertension: Secondary | ICD-10-CM

## 2018-09-10 DIAGNOSIS — R109 Unspecified abdominal pain: Secondary | ICD-10-CM

## 2018-09-10 DIAGNOSIS — R310 Gross hematuria: Secondary | ICD-10-CM | POA: Insufficient documentation

## 2018-09-10 DIAGNOSIS — I25110 Atherosclerotic heart disease of native coronary artery with unstable angina pectoris: Secondary | ICD-10-CM

## 2018-09-10 DIAGNOSIS — Z7902 Long term (current) use of antithrombotics/antiplatelets: Secondary | ICD-10-CM | POA: Insufficient documentation

## 2018-09-10 DIAGNOSIS — Z79899 Other long term (current) drug therapy: Secondary | ICD-10-CM

## 2018-09-10 DIAGNOSIS — Z85828 Personal history of other malignant neoplasm of skin: Secondary | ICD-10-CM | POA: Insufficient documentation

## 2018-09-10 DIAGNOSIS — Z87891 Personal history of nicotine dependence: Secondary | ICD-10-CM

## 2018-09-10 DIAGNOSIS — Z7982 Long term (current) use of aspirin: Secondary | ICD-10-CM | POA: Insufficient documentation

## 2018-09-10 LAB — CBC WITH DIFFERENTIAL/PLATELET
ABS IMMATURE GRANULOCYTES: 0.03 10*3/uL (ref 0.00–0.07)
BASOS PCT: 1 %
Basophils Absolute: 0.1 10*3/uL (ref 0.0–0.1)
Eosinophils Absolute: 0.2 10*3/uL (ref 0.0–0.5)
Eosinophils Relative: 3 %
HCT: 34.8 % — ABNORMAL LOW (ref 36.0–46.0)
Hemoglobin: 9.4 g/dL — ABNORMAL LOW (ref 12.0–15.0)
Immature Granulocytes: 0 %
Lymphocytes Relative: 26 %
Lymphs Abs: 1.8 10*3/uL (ref 0.7–4.0)
MCH: 21.5 pg — ABNORMAL LOW (ref 26.0–34.0)
MCHC: 27 g/dL — ABNORMAL LOW (ref 30.0–36.0)
MCV: 79.6 fL — ABNORMAL LOW (ref 80.0–100.0)
Monocytes Absolute: 0.5 10*3/uL (ref 0.1–1.0)
Monocytes Relative: 8 %
NEUTROS ABS: 4.4 10*3/uL (ref 1.7–7.7)
Neutrophils Relative %: 62 %
PLATELETS: 224 10*3/uL (ref 150–400)
RBC: 4.37 MIL/uL (ref 3.87–5.11)
RDW: 18.7 % — ABNORMAL HIGH (ref 11.5–15.5)
WBC: 6.9 10*3/uL (ref 4.0–10.5)
nRBC: 0 % (ref 0.0–0.2)

## 2018-09-10 LAB — COMPREHENSIVE METABOLIC PANEL
ALT: 15 U/L (ref 0–44)
AST: 19 U/L (ref 15–41)
Albumin: 3.9 g/dL (ref 3.5–5.0)
Alkaline Phosphatase: 69 U/L (ref 38–126)
Anion gap: 6 (ref 5–15)
BUN: 16 mg/dL (ref 8–23)
CHLORIDE: 108 mmol/L (ref 98–111)
CO2: 25 mmol/L (ref 22–32)
Calcium: 9.3 mg/dL (ref 8.9–10.3)
Creatinine, Ser: 1.31 mg/dL — ABNORMAL HIGH (ref 0.44–1.00)
GFR calc non Af Amer: 38 mL/min — ABNORMAL LOW (ref >60)
GFR, EST AFRICAN AMERICAN: 44 mL/min — AB (ref >60)
Glucose, Bld: 96 mg/dL (ref 70–99)
POTASSIUM: 4.2 mmol/L (ref 3.5–5.1)
SODIUM: 139 mmol/L (ref 135–145)
TOTAL PROTEIN: 7 g/dL (ref 6.5–8.1)
Total Bilirubin: 0.5 mg/dL (ref 0.3–1.2)

## 2018-09-10 LAB — URINALYSIS, ROUTINE W REFLEX MICROSCOPIC
Ketones, ur: >80 mg/dL — AB
Nitrite: POSITIVE — AB
SPECIFIC GRAVITY, URINE: 1.015 (ref 1.005–1.030)
pH: 6.5 (ref 5.0–8.0)

## 2018-09-10 LAB — URINALYSIS, MICROSCOPIC (REFLEX): RBC / HPF: >50 RBC/hpf (ref 0–5)

## 2018-09-10 MED ORDER — CEPHALEXIN 500 MG PO CAPS
500.0000 mg | ORAL_CAPSULE | Freq: Once | ORAL | Status: AC
  Administered 2018-09-10: 500 mg via ORAL
  Filled 2018-09-10: qty 1

## 2018-09-10 MED ORDER — CEPHALEXIN 500 MG PO CAPS: 500.0000 mg | ORAL_CAPSULE | Freq: Four times a day (QID) | ORAL | 0 refills | Status: DC

## 2018-09-10 NOTE — ED Provider Notes (Signed)
[facility] EMERGENCY DEPARTMENT Provider Note   CSN: [id number] Arrival date & time: 09/10/18  1846     History   Chief Complaint Chief Complaint  Patient presents with  . Hematuria    HPI Jessica Taylor is a 77 y.o. female.  HPI Patient presents for hematuria.  Began yesterday.  Had been seen a couple weeks ago for the same.  States it was a urinary tract infection at that time.  Had been on antibiotics and improved.  She is on Plavix.  Some mild left flank pain.  No lightheadedness or dizziness.  Patient states she has no known history of kidney stones.  No fevers.  No lightheadedness. Past Medical History:  Diagnosis Date  . Abdominal aortic aneurysm ([place])   . Acid reflux   . Anemia   . Arthritis    knees , R shoulder - tx /w injection - 11/2014  . Cancer ([place])    basal cell on nose  . Coronary artery disease   . Hypercholesteremia   . Hypertension   . Hypothyroidism   . MI, old 8  . Peripheral arterial disease (HCC)    hhigh-grade ostial bilateral calcified iliac stenosis with claudication  . Tobacco abuse   . Vertigo    when lays on left side.    Patient Active Problem List   Diagnosis Date Noted  . 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 CAD (coronary artery disease)   . Coronary artery disease involving native coronary artery of native heart without angina pectoris   . Unstable angina ([place])   . Chest pain 01/17/2016  . Hematuria 01/17/2016  . Coronary artery disease involving native coronary artery of native heart with unstable angina pectoris ([place])   . Claudication ([place]) 06/27/2015  . Acute renal insufficiency post PCI- SCr1.34 03/08/2015  . STEMI- urgent CFX PCI 03/06/15 03/06/2015  . 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 Hypokalemia 03/06/2015  . Hyperglycemia 03/06/2015  . PAD (peripheral artery disease) ([place]) 03/06/2015  . Aortoiliac occlusive disease ([place])   . Elevated troponin 02/10/2015  . GERD (gastroesophageal reflux disease)  02/08/2015  . CAD S/P prior PCI to LAD and RCA 02/08/2015  . Hypothyroidism 02/08/2015  . Hypoxia 02/08/2015  . Status post right partial knee replacement   . Primary osteoarthritis of right knee 02/04/2015  . 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 Atherosclerosis of native coronary artery of native heart without angina pectoris 06/06/2014  . Mixed hyperlipidemia 06/06/2014  . Essential hypertension 06/06/2014    Past Surgical History:  Procedure Laterality Date  . CARDIAC CATHETERIZATION  5 stents  . CARDIAC CATHETERIZATION N/A 01/20/2016   Procedure: Left Heart Cath and Coronary Angiography;  Surgeon: Peter M Martinique, MD;  Location: [place] CV LAB;  Service: Cardiovascular;  Laterality: N/A;  . CATARACT EXTRACTION W/PHACO Left 05/24/2014   Procedure: CATARACT EXTRACTION PHACO AND INTRAOCULAR LENS PLACEMENT (IOC);  Surgeon: Tonny Branch, MD;  Location: [facility];  Service: Ophthalmology;  Laterality: Left;  CDE:  9.30  . CATARACT EXTRACTION W/PHACO Right 06/18/2014   Procedure: CATARACT EXTRACTION PHACO AND INTRAOCULAR LENS PLACEMENT RIGHT EYE CDE=10.84;  Surgeon: Tonny Branch, MD;  Location: [facility];  Service: Ophthalmology;  Laterality: Right;  . CORONARY ARTERY BYPASS GRAFT N/A 01/24/2016   Procedure: CORONARY ARTERY BYPASS GRAFTING (CABG);  Surgeon: Gaye Pollack, MD;  Location: [place];  Service: Open Heart Surgery;  Laterality: N/A;  . CORONARY STENT PLACEMENT    .  CORONARY STENT PLACEMENT  03/06/15   CFX DES  . EYE SURGERY    . LEFT HEART CATHETERIZATION WITH CORONARY ANGIOGRAM N/A 03/06/2015   Procedure: LEFT HEART CATHETERIZATION WITH CORONARY ANGIOGRAM;  Surgeon: Lorretta Harp, MD;  Location: [facility] CATH LAB;  Service: Cardiovascular;  Laterality: N/A;  . PARTIAL KNEE ARTHROPLASTY Right 02/04/2015   Procedure: UNICOMPARTMENTAL KNEE;  Surgeon: Dorna Leitz, MD;  Location: [place];  Service: Orthopedics;  Laterality: Right;  . PERIPHERAL VASCULAR CATHETERIZATION Bilateral 05/13/2015    Procedure: Lower Extremity Angiography;  Surgeon: Lorretta Harp, MD;  Location: [place] [facility] LAB;  Service: Cardiovascular;  Laterality: Bilateral;  . PERIPHERAL VASCULAR CATHETERIZATION N/A 05/13/2015   Procedure: Abdominal Aortogram;  Surgeon: Lorretta Harp, MD;  Location: [place] CV LAB;  Service: Cardiovascular;  Laterality: N/A;  . PERIPHERAL VASCULAR CATHETERIZATION Bilateral 06/27/2015   Procedure: Peripheral Vascular Intervention;  Surgeon: Lorretta Harp, MD;  Location: [place] CV LAB;  Service: Cardiovascular;  Laterality: Bilateral;  ILIACS  . PERIPHERAL VASCULAR CATHETERIZATION Bilateral 06/27/2015   Procedure: Peripheral Vascular Atherectomy;  Surgeon: Lorretta Harp, MD;  Location: [place] CV LAB;  Service: Cardiovascular;  Laterality: Bilateral;  . TEE WITHOUT CARDIOVERSION N/A 01/24/2016   Procedure: TRANSESOPHAGEAL ECHOCARDIOGRAM (TEE);  Surgeon: Gaye Pollack, MD;  Location: [place];  Service: Open Heart Surgery;  Laterality: N/A;  . TONSILLECTOMY     age 58  . TUBAL LIGATION       OB History    Gravida  4   Para  4   Term  4   Preterm      AB      Living  4     SAB      TAB      Ectopic      Multiple      Live Births               Home Medications    Prior to Admission medications   Medication Sig Start Date End Date Taking? Authorizing Provider  aspirin EC 81 MG tablet Take 81 mg by mouth daily.    Yes [provider]  atorvastatin (LIPITOR) 40 MG tablet TAKE (1) TABLET BY MOUTH ONCE DAILY. 05/09/18  Yes Burtis Junes, NP  clopidogrel (PLAVIX) 75 MG tablet TAKE (1) TABLET BY MOUTH ONCE DAILY. 08/03/18  Yes Burtis Junes, NP  ferrous sulfate 325 (65 FE) MG EC tablet Take 325 mg by mouth daily with breakfast.   Yes [provider]  HYDROcodone-acetaminophen (NORCO/VICODIN) 5-325 MG tablet Take 1 tablet by mouth 2 (two) times daily. 04/16/17  Yes [provider]  levothyroxine (SYNTHROID, LEVOTHROID) 50  MCG tablet Take 50 mcg by mouth daily before breakfast.   Yes [provider]  metoprolol tartrate (LOPRESSOR) 25 MG tablet TAKE 1 TABLET BY MOUTH TWICE DAILY. 06/06/18  Yes Burtis Junes, NP  nitroGLYCERIN (NITROSTAT) 0.4 MG SL tablet Place 1 tablet (0.4 mg total) under the tongue every 5 (five) minutes as needed for chest pain. 05/13/18  Yes Jerline Pain, MD  pantoprazole (PROTONIX) 40 MG tablet Take 40 mg by mouth 2 (two) times daily.  05/17/17  Yes [provider]  cephALEXin (KEFLEX) 500 MG capsule Take 1 capsule (500 mg total) by mouth 4 (four) times daily. 09/10/18   Davonna Belling, MD    Family History Family History  Problem Relation Age of Onset  . Cancer Mother 50  . Cancer Father 15  .  Hypertension Maternal Grandmother   . Stroke Maternal Grandfather   . Hypertension Son   . Heart attack Neg Hx     Social History Social History   Tobacco Use  . Smoking status: Former Smoker    Packs/day: 1.50    Years: 42.00    Pack years: 63.00    Types: Cigarettes    Last attempt to quit: 05/19/1999    Years since quitting: 19.3  . Smokeless tobacco: Never Used  Substance Use Topics  . Alcohol use: No  . Drug use: No     Allergies   Shrimp [shellfish allergy]   Review of Systems Review of Systems  Constitutional: Negative for appetite change.  HENT: Negative for congestion.   Respiratory: Negative for shortness of breath.   Cardiovascular: Negative for chest pain.  Gastrointestinal: Negative for abdominal pain.  Genitourinary: Positive for hematuria.  Musculoskeletal: Negative for back pain.  Skin: Negative for rash.  Neurological: Negative for seizures.     Physical Exam Updated Vital Signs BP (!) 155/76 (BP Location: Left Arm)   Pulse 67   Temp 98.6 F (37 C) (Oral)   Resp 14   Ht 5\' 3"  (1.6 m)   Wt 73.9 kg   SpO2 95%   BMI 28.87 kg/m   Physical Exam  Constitutional: She appears well-developed.  HENT:  Head: Atraumatic.    Neck: Neck supple.  Cardiovascular: Normal rate.  Pulmonary/Chest: Effort normal.  Abdominal: There is no tenderness.  Genitourinary:  Genitourinary Comments: No CVA tenderness.  Musculoskeletal: She exhibits no tenderness.  Neurological: She is alert.  Skin: Skin is warm. Capillary refill takes less than 2 seconds.     ED Treatments / Results  Labs (all labs ordered are listed, but only abnormal results are displayed) Labs Reviewed  COMPREHENSIVE METABOLIC PANEL - Abnormal; Notable for the following components:      Result Value   Creatinine, Ser 1.31 (*)    GFR calc non Af Amer 38 (*)    GFR calc Af Amer 44 (*)    All other components within normal limits  URINALYSIS, ROUTINE W REFLEX MICROSCOPIC - Abnormal; Notable for the following components:   Color, Urine RED (*)    APPearance TURBID (*)    Glucose, UA >=500 (*)    Hgb urine dipstick LARGE (*)    Bilirubin Urine MODERATE (*)    Ketones, ur >80 (*)    Protein, ur >300 (*)    Nitrite POSITIVE (*)    Leukocytes, UA LARGE (*)    All other components within normal limits  CBC WITH DIFFERENTIAL/PLATELET - Abnormal; Notable for the following components:   Hemoglobin 9.4 (*)    HCT 34.8 (*)    MCV 79.6 (*)    MCH 21.5 (*)    MCHC 27.0 (*)    RDW 18.7 (*)    All other components within normal limits  URINALYSIS, MICROSCOPIC (REFLEX) - Abnormal; Notable for the following components:   Bacteria, UA FEW (*)    All other components within normal limits  URINE CULTURE    EKG None  Radiology No results found.  Procedures Procedures (including critical care time)  Medications Ordered in ED Medications  cephALEXin (KEFLEX) capsule 500 mg (500 mg Oral Given 09/10/18 2229)     Initial Impression / Assessment and Plan / ED Course  I have reviewed the triage vital signs and the nursing notes.  Pertinent labs & imaging results that were available during my care of the  patient were reviewed by me and considered in  my medical decision making (see chart for details).     Patient with hematuria.  History of same.  Reviewed previous CT scan that did show some possible renal stones.  Doubt obstructing stone at this time.  Hemoglobin is stable from couple weeks ago.  Although it may be decreased from her baseline. Urine showed potential infection.  Discussed patient and will empirically treat but they will follow-up with the culture.  Will need follow-up with urology also.  Final Clinical Impressions(s) / ED Diagnoses   Final diagnoses:  Gross hematuria    ED Discharge Orders         Ordered    cephALEXin (KEFLEX) 500 MG capsule  4 times daily     09/10/18 2231           Davonna Belling, MD 09/10/18 2328

## 2018-09-10 NOTE — Discharge Instructions (Addendum)
Have your doctor or urology follow the urine culture to be sure it shows infection susceptible to Keflex.

## 2018-09-10 NOTE — ED Notes (Signed)
ED Provider at bedside. 

## 2018-09-10 NOTE — ED Triage Notes (Signed)
Blood in urine onset yesterday

## 2018-09-11 ENCOUNTER — Inpatient Hospital Stay (HOSPITAL_COMMUNITY)
Admission: EM | Admit: 2018-09-11 | Discharge: 2018-09-15 | DRG: 853 | Disposition: A | Discharge status: Home / Self Care | Payer: Medicare Other | Attending: Internal Medicine | Admitting: Internal Medicine

## 2018-09-11 ENCOUNTER — Encounter (HOSPITAL_COMMUNITY): Payer: Self-pay | Admitting: Emergency Medicine

## 2018-09-11 ENCOUNTER — Emergency Department (HOSPITAL_COMMUNITY): Payer: Medicare Other

## 2018-09-11 ENCOUNTER — Other Ambulatory Visit: Payer: Self-pay

## 2018-09-11 DIAGNOSIS — I252 Old myocardial infarction: Secondary | ICD-10-CM

## 2018-09-11 DIAGNOSIS — Z85828 Personal history of other malignant neoplasm of skin: Secondary | ICD-10-CM | POA: Diagnosis not present

## 2018-09-11 DIAGNOSIS — Z951 Presence of aortocoronary bypass graft: Secondary | ICD-10-CM | POA: Diagnosis not present

## 2018-09-11 DIAGNOSIS — Z79899 Other long term (current) drug therapy: Secondary | ICD-10-CM

## 2018-09-11 DIAGNOSIS — N182 Chronic kidney disease, stage 2 (mild): Secondary | ICD-10-CM

## 2018-09-11 DIAGNOSIS — Z955 Presence of coronary angioplasty implant and graft: Secondary | ICD-10-CM | POA: Diagnosis not present

## 2018-09-11 DIAGNOSIS — D509 Iron deficiency anemia, unspecified: Secondary | ICD-10-CM | POA: Diagnosis not present

## 2018-09-11 DIAGNOSIS — Z9861 Coronary angioplasty status: Secondary | ICD-10-CM

## 2018-09-11 DIAGNOSIS — T50995S Adverse effect of other drugs, medicaments and biological substances, sequela: Secondary | ICD-10-CM

## 2018-09-11 DIAGNOSIS — Z7902 Long term (current) use of antithrombotics/antiplatelets: Secondary | ICD-10-CM

## 2018-09-11 DIAGNOSIS — I714 Abdominal aortic aneurysm, without rupture: Secondary | ICD-10-CM | POA: Diagnosis not present

## 2018-09-11 DIAGNOSIS — Z87891 Personal history of nicotine dependence: Secondary | ICD-10-CM

## 2018-09-11 DIAGNOSIS — D62 Acute posthemorrhagic anemia: Secondary | ICD-10-CM | POA: Diagnosis present

## 2018-09-11 DIAGNOSIS — K219 Gastro-esophageal reflux disease without esophagitis: Secondary | ICD-10-CM | POA: Diagnosis present

## 2018-09-11 DIAGNOSIS — N10 Acute tubulo-interstitial nephritis: Secondary | ICD-10-CM | POA: Diagnosis not present

## 2018-09-11 DIAGNOSIS — N183 Chronic kidney disease, stage 3 (moderate): Secondary | ICD-10-CM | POA: Diagnosis not present

## 2018-09-11 DIAGNOSIS — Z7982 Long term (current) use of aspirin: Secondary | ICD-10-CM | POA: Diagnosis not present

## 2018-09-11 DIAGNOSIS — E039 Hypothyroidism, unspecified: Secondary | ICD-10-CM | POA: Diagnosis present

## 2018-09-11 DIAGNOSIS — N12 Tubulo-interstitial nephritis, not specified as acute or chronic: Secondary | ICD-10-CM | POA: Diagnosis not present

## 2018-09-11 DIAGNOSIS — E7800 Pure hypercholesterolemia, unspecified: Secondary | ICD-10-CM | POA: Diagnosis present

## 2018-09-11 DIAGNOSIS — A419 Sepsis, unspecified organism: Principal | ICD-10-CM | POA: Diagnosis present

## 2018-09-11 DIAGNOSIS — I739 Peripheral vascular disease, unspecified: Secondary | ICD-10-CM | POA: Diagnosis not present

## 2018-09-11 DIAGNOSIS — I10 Essential (primary) hypertension: Secondary | ICD-10-CM | POA: Diagnosis present

## 2018-09-11 DIAGNOSIS — I2510 Atherosclerotic heart disease of native coronary artery without angina pectoris: Secondary | ICD-10-CM | POA: Diagnosis present

## 2018-09-11 DIAGNOSIS — I129 Hypertensive chronic kidney disease with stage 1 through stage 4 chronic kidney disease, or unspecified chronic kidney disease: Secondary | ICD-10-CM | POA: Diagnosis present

## 2018-09-11 DIAGNOSIS — R310 Gross hematuria: Secondary | ICD-10-CM | POA: Diagnosis present

## 2018-09-11 DIAGNOSIS — N136 Pyonephrosis: Secondary | ICD-10-CM | POA: Diagnosis not present

## 2018-09-11 DIAGNOSIS — E038 Other specified hypothyroidism: Secondary | ICD-10-CM | POA: Diagnosis not present

## 2018-09-11 DIAGNOSIS — N170 Acute kidney failure with tubular necrosis: Secondary | ICD-10-CM | POA: Diagnosis present

## 2018-09-11 HISTORY — DX: Acute pyelonephritis: N10

## 2018-09-11 HISTORY — DX: Basal cell carcinoma of skin of nose: C44.311

## 2018-09-11 LAB — CBC WITH DIFFERENTIAL/PLATELET
ABS IMMATURE GRANULOCYTES: 0.07 10*3/uL (ref 0.00–0.07)
Basophils Absolute: 0.1 10*3/uL (ref 0.0–0.1)
Basophils Relative: 0 %
Eosinophils Absolute: 0.1 10*3/uL (ref 0.0–0.5)
Eosinophils Relative: 1 %
HEMATOCRIT: 37.3 % (ref 36.0–46.0)
HEMOGLOBIN: 10.3 g/dL — AB (ref 12.0–15.0)
Immature Granulocytes: 1 %
LYMPHS PCT: 4 %
Lymphs Abs: 0.6 10*3/uL — ABNORMAL LOW (ref 0.7–4.0)
MCH: 22.2 pg — ABNORMAL LOW (ref 26.0–34.0)
MCHC: 27.6 g/dL — ABNORMAL LOW (ref 30.0–36.0)
MCV: 80.4 fL (ref 80.0–100.0)
Monocytes Absolute: 0.6 10*3/uL (ref 0.1–1.0)
Monocytes Relative: 4 %
NRBC: 0 % (ref 0.0–0.2)
Neutro Abs: 13.3 10*3/uL — ABNORMAL HIGH (ref 1.7–7.7)
Neutrophils Relative %: 90 %
Platelets: 240 10*3/uL (ref 150–400)
RBC: 4.64 MIL/uL (ref 3.87–5.11)
RDW: 19.4 % — ABNORMAL HIGH (ref 11.5–15.5)
WBC: 14.8 10*3/uL — AB (ref 4.0–10.5)

## 2018-09-11 LAB — URINALYSIS, ROUTINE W REFLEX MICROSCOPIC
Bacteria, UA: NONE SEEN
Bilirubin Urine: NEGATIVE
GLUCOSE, UA: NEGATIVE mg/dL
Ketones, ur: NEGATIVE mg/dL
LEUKOCYTES UA: NEGATIVE
NITRITE: NEGATIVE
Protein, ur: 30 mg/dL — AB
Specific Gravity, Urine: 1.019 (ref 1.005–1.030)
pH: 5 (ref 5.0–8.0)

## 2018-09-11 LAB — BASIC METABOLIC PANEL
ANION GAP: 8 (ref 5–15)
BUN: 18 mg/dL (ref 8–23)
CHLORIDE: 106 mmol/L (ref 98–111)
CO2: 25 mmol/L (ref 22–32)
Calcium: 9.5 mg/dL (ref 8.9–10.3)
Creatinine, Ser: 1.68 mg/dL — ABNORMAL HIGH (ref 0.44–1.00)
GFR calc Af Amer: 32 mL/min — ABNORMAL LOW (ref >60)
GFR, EST NON AFRICAN AMERICAN: 28 mL/min — AB (ref >60)
Glucose, Bld: 149 mg/dL — ABNORMAL HIGH (ref 70–99)
Potassium: 4.1 mmol/L (ref 3.5–5.1)
SODIUM: 139 mmol/L (ref 135–145)

## 2018-09-11 MED ORDER — SODIUM CHLORIDE 0.9% FLUSH
3.0000 mL | Freq: Two times a day (BID) | INTRAVENOUS | Status: DC
  Administered 2018-09-11 – 2018-09-12 (×2): 3 mL via INTRAVENOUS

## 2018-09-11 MED ORDER — MORPHINE SULFATE (PF) 4 MG/ML IV SOLN
4.0000 mg | Freq: Once | INTRAVENOUS | Status: AC
  Administered 2018-09-11: 4 mg via INTRAVENOUS
  Filled 2018-09-11: qty 1

## 2018-09-11 MED ORDER — SODIUM CHLORIDE 0.9 % IV SOLN
1.0000 g | Freq: Once | INTRAVENOUS | Status: AC
  Administered 2018-09-11: 1 g via INTRAVENOUS
  Filled 2018-09-11: qty 10

## 2018-09-11 MED ORDER — FERROUS SULFATE 325 (65 FE) MG PO TABS
325.0000 mg | ORAL_TABLET | Freq: Every day | ORAL | Status: DC
  Administered 2018-09-12 – 2018-09-14 (×3): 325 mg via ORAL
  Filled 2018-09-11 (×4): qty 1

## 2018-09-11 MED ORDER — POLYETHYLENE GLYCOL 3350 17 G PO PACK: 17.0000 g | PACK | Freq: Every day | ORAL | Status: DC | PRN

## 2018-09-11 MED ORDER — ALBUTEROL SULFATE (2.5 MG/3ML) 0.083% IN NEBU: 2.5000 mg | INHALATION_SOLUTION | RESPIRATORY_TRACT | Status: DC | PRN

## 2018-09-11 MED ORDER — FENTANYL CITRATE (PF) 100 MCG/2ML IJ SOLN
INTRAMUSCULAR | Status: AC
  Filled 2018-09-11: qty 2

## 2018-09-11 MED ORDER — LEVOTHYROXINE SODIUM 50 MCG PO TABS
50.0000 ug | ORAL_TABLET | Freq: Every day | ORAL | Status: DC
  Administered 2018-09-12 – 2018-09-15 (×4): 50 ug via ORAL
  Filled 2018-09-11 (×4): qty 1

## 2018-09-11 MED ORDER — PANTOPRAZOLE SODIUM 40 MG PO TBEC
40.0000 mg | DELAYED_RELEASE_TABLET | Freq: Two times a day (BID) | ORAL | Status: DC
  Administered 2018-09-11 – 2018-09-15 (×8): 40 mg via ORAL
  Filled 2018-09-11 (×8): qty 1

## 2018-09-11 MED ORDER — ATORVASTATIN CALCIUM 40 MG PO TABS
40.0000 mg | ORAL_TABLET | Freq: Every day | ORAL | Status: DC
  Administered 2018-09-13 – 2018-09-14 (×2): 40 mg via ORAL
  Filled 2018-09-11 (×2): qty 1

## 2018-09-11 MED ORDER — OXYCODONE HCL 5 MG PO TABS: 5.0000 mg | ORAL_TABLET | ORAL | Status: DC | PRN

## 2018-09-11 MED ORDER — FENTANYL CITRATE (PF) 100 MCG/2ML IJ SOLN
50.0000 ug | Freq: Once | INTRAMUSCULAR | Status: AC
  Administered 2018-09-11: 50 ug via INTRAVENOUS
  Filled 2018-09-11: qty 2

## 2018-09-11 MED ORDER — ONDANSETRON 4 MG PO TBDP
4.0000 mg | ORAL_TABLET | Freq: Once | ORAL | Status: AC
  Administered 2018-09-11: 4 mg via ORAL
  Filled 2018-09-11: qty 1

## 2018-09-11 MED ORDER — ACETAMINOPHEN 650 MG RE SUPP: 650.0000 mg | Freq: Four times a day (QID) | RECTAL | Status: DC | PRN

## 2018-09-11 MED ORDER — ASPIRIN EC 81 MG PO TBEC
81.0000 mg | DELAYED_RELEASE_TABLET | Freq: Every day | ORAL | Status: DC
Start: 2018-09-11 — End: 2018-09-12
  Administered 2018-09-11 – 2018-09-12 (×2): 81 mg via ORAL
  Filled 2018-09-11 (×2): qty 1

## 2018-09-11 MED ORDER — SODIUM CHLORIDE 0.9% FLUSH: 3.0000 mL | INTRAVENOUS | Status: DC | PRN

## 2018-09-11 MED ORDER — TRAZODONE HCL 50 MG PO TABS: 50.0000 mg | ORAL_TABLET | Freq: Every evening | ORAL | Status: DC | PRN

## 2018-09-11 MED ORDER — NITROGLYCERIN 0.4 MG SL SUBL: 0.4000 mg | SUBLINGUAL_TABLET | SUBLINGUAL | Status: DC | PRN

## 2018-09-11 MED ORDER — ONDANSETRON HCL 4 MG PO TABS: 4.0000 mg | ORAL_TABLET | Freq: Four times a day (QID) | ORAL | Status: DC | PRN

## 2018-09-11 MED ORDER — HYDRALAZINE HCL 20 MG/ML IJ SOLN: 10.0000 mg | Freq: Four times a day (QID) | INTRAMUSCULAR | Status: DC | PRN

## 2018-09-11 MED ORDER — FENTANYL CITRATE (PF) 100 MCG/2ML IJ SOLN
50.0000 ug | Freq: Once | INTRAMUSCULAR | Status: AC
  Administered 2018-09-11: 21:00:00 via INTRAVENOUS

## 2018-09-11 MED ORDER — SODIUM CHLORIDE 0.9 % IV SOLN
INTRAVENOUS | Status: DC
  Administered 2018-09-11 – 2018-09-14 (×3): via INTRAVENOUS

## 2018-09-11 MED ORDER — HEPARIN SODIUM (PORCINE) 5000 UNIT/ML IJ SOLN
5000.0000 [IU] | Freq: Three times a day (TID) | INTRAMUSCULAR | Status: DC
  Administered 2018-09-11 – 2018-09-15 (×10): 5000 [IU] via SUBCUTANEOUS
  Filled 2018-09-11 (×10): qty 1

## 2018-09-11 MED ORDER — ONDANSETRON HCL 4 MG/2ML IJ SOLN
4.0000 mg | Freq: Four times a day (QID) | INTRAMUSCULAR | Status: DC | PRN
  Administered 2018-09-11 – 2018-09-12 (×3): 4 mg via INTRAVENOUS
  Filled 2018-09-11 (×3): qty 2

## 2018-09-11 MED ORDER — MORPHINE SULFATE (PF) 2 MG/ML IV SOLN
2.0000 mg | INTRAVENOUS | Status: DC | PRN
  Administered 2018-09-11 – 2018-09-12 (×4): 2 mg via INTRAVENOUS
  Filled 2018-09-11 (×4): qty 1

## 2018-09-11 MED ORDER — PIPERACILLIN-TAZOBACTAM 3.375 G IVPB 30 MIN
3.3750 g | Freq: Once | INTRAVENOUS | Status: AC
  Administered 2018-09-11: 3.375 g via INTRAVENOUS
  Filled 2018-09-11: qty 50

## 2018-09-11 MED ORDER — SODIUM CHLORIDE 0.9 % IV SOLN: 250.0000 mL | INTRAVENOUS | Status: DC | PRN

## 2018-09-11 MED ORDER — PIPERACILLIN-TAZOBACTAM 3.375 G IVPB
3.3750 g | Freq: Three times a day (TID) | INTRAVENOUS | Status: DC
  Administered 2018-09-12 (×2): 3.375 g via INTRAVENOUS
  Filled 2018-09-11 (×2): qty 50

## 2018-09-11 MED ORDER — SODIUM CHLORIDE 0.9 % IV SOLN
Freq: Once | INTRAVENOUS | Status: AC
  Administered 2018-09-11: 20:00:00 via INTRAVENOUS

## 2018-09-11 MED ORDER — ONDANSETRON HCL 4 MG/2ML IJ SOLN
4.0000 mg | Freq: Once | INTRAMUSCULAR | Status: AC
  Administered 2018-09-11: 4 mg via INTRAVENOUS
  Filled 2018-09-11: qty 2

## 2018-09-11 MED ORDER — SENNOSIDES-DOCUSATE SODIUM 8.6-50 MG PO TABS
2.0000 | ORAL_TABLET | Freq: Two times a day (BID) | ORAL | Status: DC
  Administered 2018-09-11 – 2018-09-15 (×8): 2 via ORAL
  Filled 2018-09-11 (×8): qty 2

## 2018-09-11 MED ORDER — ACETAMINOPHEN 325 MG PO TABS: 650.0000 mg | ORAL_TABLET | Freq: Four times a day (QID) | ORAL | Status: DC | PRN

## 2018-09-11 MED ORDER — CLOPIDOGREL BISULFATE 75 MG PO TABS
75.0000 mg | ORAL_TABLET | Freq: Every day | ORAL | Status: DC
Start: 2018-09-11 — End: 2018-09-12
  Administered 2018-09-11 – 2018-09-12 (×2): 75 mg via ORAL
  Filled 2018-09-11 (×2): qty 1

## 2018-09-11 MED ORDER — METOPROLOL TARTRATE 25 MG PO TABS
25.0000 mg | ORAL_TABLET | Freq: Two times a day (BID) | ORAL | Status: DC
  Administered 2018-09-11 – 2018-09-15 (×7): 25 mg via ORAL
  Filled 2018-09-11 (×8): qty 1

## 2018-09-11 NOTE — Progress Notes (Signed)
Pharmacy Antibiotic Note  Jessica Taylor is a 77 y.o. female admitted on 09/11/2018 with Pseudomonal pyelonephritis.  Pharmacy has been consulted for zosyn dosing.  Plan: Zosyn 3.375gm IV now over 30 min, then Zosyn 3.375g IV q8h (4 hour infusion). F/U cxs and clinical progress Monitor V/S, labs  Height: 5\' 3"  (160 cm) Weight: 160 lb (72.6 kg) IBW/kg (Calculated) : 52.4  Temp (24hrs), Avg:97.4 F (36.3 C), Min:97.4 F (36.3 C), Max:97.4 F (36.3 C)  Recent Labs  Lab 09/10/18 1939 09/11/18 1918  WBC 6.9 14.8*  CREATININE 1.31* 1.68*    Estimated Creatinine Clearance: 25.9 mL/min (A) (by C-G formula based on SCr of 1.68 mg/dL (H)).    Allergies  Allergen Reactions  . Shrimp [Shellfish Allergy] Nausea And Vomiting    Throws up violently    Antimicrobials this admission: Zosyn 10/20 >>  Ceftriaxone x 1 dose 10/20  Dose adjustments this admission: N/A  Microbiology results: 10/20 BCx: pending 10/19 UCx: pending  Thank you for allowing pharmacy to be a part of this patient's care.  Isac Sarna, BS [place], [place] Clinical Pharmacist Pager [phone] 09/11/2018 9:25 PM

## 2018-09-11 NOTE — ED Provider Notes (Signed)
[facility] EMERGENCY DEPARTMENT Provider Note   CSN: [id number] Arrival date & time: 09/11/18  1544     History   Chief Complaint Chief Complaint  Patient presents with  . Abdominal Pain    HPI Jessica Taylor is a 77 y.o. female.  77 year old female, with a PMH of HTN, HLD, CAD, hypothyroidism, PAD, presents with left flank pain.  Patient states pain yesterday was mostly in the left lower quadrant.  Patient was seen and evaluated in the emergency room and diagnosed with a UTI.  Patient was started on Keflex.  She notes significant worsening in her pain today with associated left flank pain.  She states hematuria, nausea and several episodes of vomiting.  She denies any vomiting, fevers, chills, dysuria, diarrhea, chest pain, shortness of breath.  Patient denies a known history of kidney stones.   Abdominal Pain   The current episode started 2 days ago. Associated symptoms include nausea, vomiting and hematuria. Pertinent negatives include fever and dysuria.    Past Medical History:  Diagnosis Date  . Abdominal aortic aneurysm ([place])   . Acid reflux   . Anemia   . Arthritis    knees , R shoulder - tx /w injection - 11/2014  . Cancer ([place])    basal cell on nose  . Coronary artery disease   . Hypercholesteremia   . Hypertension   . Hypothyroidism   . MI, old 34  . Peripheral arterial disease (H[facility])    hhigh-grade ostial bilateral calcified iliac stenosis with claudication  . Tobacco abuse   . Vertigo    when lays on left side.    Patient Active Problem List   Diagnosis Date Noted  . 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 CAD (coronary artery disease)   . Coronary artery disease involving native coronary artery of native heart without angina pectoris   . Unstable angina ([place])   . Chest pain 01/17/2016  . Hematuria 01/17/2016  . Coronary artery disease involving native coronary artery of native heart with unstable angina pectoris ([place])   . Claudication ([place]) 06/27/2015  .  Acute renal insufficiency post PCI- SCr1.34 03/08/2015  . STEMI- urgent CFX PCI 03/06/15 03/06/2015  . 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 Hypokalemia 03/06/2015  . Hyperglycemia 03/06/2015  . PAD (peripheral artery disease) ([place]) 03/06/2015  . Aortoiliac occlusive disease ([place])   . Elevated troponin 02/10/2015  . GERD (gastroesophageal reflux disease) 02/08/2015  . CAD S/P prior PCI to LAD and RCA 02/08/2015  . Hypothyroidism 02/08/2015  . Hypoxia 02/08/2015  . Status post right partial knee replacement   . Primary osteoarthritis of right knee 02/04/2015  . 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 Atherosclerosis of native coronary artery of native heart without angina pectoris 06/06/2014  . Mixed hyperlipidemia 06/06/2014  . Essential hypertension 06/06/2014    Past Surgical History:  Procedure Laterality Date  . CARDIAC CATHETERIZATION  5 stents  . CARDIAC CATHETERIZATION N/A 01/20/2016   Procedure: Left Heart Cath and Coronary Angiography;  Surgeon: Peter M Martinique, MD;  Location: [place] [facility];  Service: Cardiovascular;  Laterality: N/A;  . CATARACT EXTRACTION W/PHACO Left 05/24/2014   Procedure: CATARACT EXTRACTION PHACO AND INTRAOCULAR LENS PLACEMENT (IOC);  Surgeon: Tonny Branch, MD;  Location: [facility];  Service: Ophthalmology;  Laterality: Left;  CDE:  9.30  . CATARACT EXTRACTION W/PHACO Right 06/18/2014   Procedure: CATARACT EXTRACTION PHACO AND INTRAOCULAR LENS PLACEMENT RIGHT EYE CDE=10.84;  Surgeon: Tonny Branch, MD;  Location: [facility];  Service: Ophthalmology;  Laterality: Right;  . CORONARY ARTERY BYPASS GRAFT N/A 01/24/2016   Procedure: CORONARY ARTERY BYPASS GRAFTING (CABG);  Surgeon: Gaye Pollack, MD;  Location: [place];  Service: Open Heart Surgery;  Laterality: N/A;  . CORONARY STENT PLACEMENT    . CORONARY STENT PLACEMENT  03/06/15   CFX DES  . EYE SURGERY    . LEFT HEART CATHETERIZATION WITH CORONARY ANGIOGRAM N/A 03/06/2015   Procedure: LEFT  HEART CATHETERIZATION WITH CORONARY ANGIOGRAM;  Surgeon: Lorretta Harp, MD;  Location: [facility];  Service: Cardiovascular;  Laterality: N/A;  . PARTIAL KNEE ARTHROPLASTY Right 02/04/2015   Procedure: UNICOMPARTMENTAL KNEE;  Surgeon: Dorna Leitz, MD;  Location: [place];  Service: Orthopedics;  Laterality: Right;  . PERIPHERAL VASCULAR CATHETERIZATION Bilateral 05/13/2015   Procedure: Lower Extremity Angiography;  Surgeon: Lorretta Harp, MD;  Location: [place] [facility];  Service: Cardiovascular;  Laterality: Bilateral;  . PERIPHERAL VASCULAR CATHETERIZATION N/A 05/13/2015   Procedure: Abdominal Aortogram;  Surgeon: Lorretta Harp, MD;  Location: [place] [facility];  Service: Cardiovascular;  Laterality: N/A;  . PERIPHERAL VASCULAR CATHETERIZATION Bilateral 06/27/2015   Procedure: Peripheral Vascular Intervention;  Surgeon: Lorretta Harp, MD;  Location: [place] [facility];  Service: Cardiovascular;  Laterality: Bilateral;  ILIACS  . PERIPHERAL VASCULAR CATHETERIZATION Bilateral 06/27/2015   Procedure: Peripheral Vascular Atherectomy;  Surgeon: Lorretta Harp, MD;  Location: [place] [facility];  Service: Cardiovascular;  Laterality: Bilateral;  . TEE WITHOUT CARDIOVERSION N/A 01/24/2016   Procedure: TRANSESOPHAGEAL ECHOCARDIOGRAM (TEE);  Surgeon: Gaye Pollack, MD;  Location: [place];  Service: Open Heart Surgery;  Laterality: N/A;  . TONSILLECTOMY     age 73  . TUBAL LIGATION       OB History    Gravida  4   Para  4   Term  4   Preterm      AB      Living  4     SAB      TAB      Ectopic      Multiple      Live Births               Home Medications    Prior to Admission medications   Medication Sig Start Date End Date Taking? Authorizing Provider  aspirin EC 81 MG tablet Take 81 mg by mouth daily.     [provider]  atorvastatin (LIPITOR) 40 MG tablet TAKE (1) TABLET BY MOUTH ONCE DAILY. 05/09/18   Burtis Junes, NP  cephALEXin (KEFLEX) 500 MG  capsule Take 1 capsule (500 mg total) by mouth 4 (four) times daily. 09/10/18   Davonna Belling, MD  clopidogrel (PLAVIX) 75 MG tablet TAKE (1) TABLET BY MOUTH ONCE DAILY. 08/03/18   Burtis Junes, NP  ferrous sulfate 325 (65 FE) MG EC tablet Take 325 mg by mouth daily with breakfast.    [provider]  HYDROcodone-acetaminophen (NORCO/VICODIN) 5-325 MG tablet Take 1 tablet by mouth 2 (two) times daily. 04/16/17   [provider]  levothyroxine (SYNTHROID, LEVOTHROID) 50 MCG tablet Take 50 mcg by mouth daily before breakfast.    [provider]  metoprolol tartrate (LOPRESSOR) 25 MG tablet TAKE 1 TABLET BY MOUTH TWICE DAILY. 06/06/18   Burtis Junes, NP  nitroGLYCERIN (NITROSTAT) 0.4 MG SL tablet Place 1 tablet (0.4 mg total) under the tongue every 5 (five) minutes  as needed for chest pain. 05/13/18   Jerline Pain, MD  pantoprazole (PROTONIX) 40 MG tablet Take 40 mg by mouth 2 (two) times daily.  05/17/17   [provider]    Family History Family History  Problem Relation Age of Onset  . Cancer Mother 60  . Cancer Father 24  . Hypertension Maternal Grandmother   . Stroke Maternal Grandfather   . Hypertension Son   . Heart attack Neg Hx     Social History Social History   Tobacco Use  . Smoking status: Former Smoker    Packs/day: 1.50    Years: 42.00    Pack years: 63.00    Types: Cigarettes    Last attempt to quit: 05/19/1999    Years since quitting: 19.3  . Smokeless tobacco: Never Used  Substance Use Topics  . Alcohol use: No  . Drug use: No     Allergies   Shrimp [shellfish allergy]   Review of Systems Review of Systems  Constitutional: Negative for chills and fever.  HENT: Negative for ear pain and rhinorrhea.   Respiratory: Negative for shortness of breath.   Cardiovascular: Negative for chest pain.  Gastrointestinal: Positive for abdominal pain, nausea and vomiting.  Genitourinary: Positive for flank pain and hematuria.  Negative for dysuria.     Physical Exam Updated Vital Signs BP (!) 180/79 (BP Location: Left Arm)   Pulse 84   Temp (!) 97.4 F (36.3 C) (Oral)   Resp 18   Ht 5\' 3"  (1.6 m)   Wt 72.6 kg   SpO2 98%   BMI 28.34 kg/m   Physical Exam  Constitutional: She is oriented to person, place, and time. She appears well-developed and well-nourished.  HENT:  Head: Normocephalic and atraumatic.  Eyes: Conjunctivae and EOM are normal.  Neck: Neck supple.  Cardiovascular: Normal rate, regular rhythm and normal heart sounds.  No murmur heard. Pulmonary/Chest: Effort normal and breath sounds normal. No respiratory distress. She has no wheezes. She has no rales.  Abdominal: Soft. Bowel sounds are normal. She exhibits no distension. There is no tenderness. There is CVA tenderness (left sided). There is no rigidity, no rebound and no guarding.  Musculoskeletal: Normal range of motion. She exhibits no tenderness or deformity.  Neurological: She is alert and oriented to person, place, and time.  Skin: Skin is warm and dry. No rash noted. No erythema.  Psychiatric: She has a normal mood and affect. Her behavior is normal.  Nursing note and vitals reviewed.    ED Treatments / Results  Labs (all labs ordered are listed, but only abnormal results are displayed) Labs Reviewed  URINALYSIS, ROUTINE W REFLEX MICROSCOPIC  CBC WITH DIFFERENTIAL/PLATELET  BASIC METABOLIC PANEL    EKG None  Radiology Ct Renal Stone Study  Result Date: 09/11/2018 CLINICAL DATA:  Left upper abdominal pain radiating to the left flank. Nausea vomiting. EXAM: CT ABDOMEN AND PELVIS WITHOUT CONTRAST TECHNIQUE: Multidetector CT imaging of the abdomen and pelvis was performed following the standard protocol without IV contrast. COMPARISON:  02/21/2016 FINDINGS: Lower chest: No acute abnormality. Calcific atherosclerotic disease of the coronary arteries. Post CABG. Small hiatal hernia. Hepatobiliary: 11 mm right hepatic cyst.   Suspected cholelithiasis. Pancreas: Atrophic appearance of the pancreas. Spleen: Normal in size without focal abnormality. Adrenals/Urinary Tract: Normal adrenal glands. Normal appearance of the right kidney, right ureter and urinary bladder. Marked left perinephric stranding with moderate left hydronephrosis. High density material is present within the collecting system of the  left kidney. There is a 3 mm distal left ureteral calculus just upstream of the vesicoureteral junction. Minimal upstream dilation of the left ureter. Stomach/Bowel: Stomach is within normal limits. No evidence of appendicitis. No evidence of bowel wall thickening, distention, or inflammatory changes. Marked left colonic diverticulosis with chronic appearing thickening of the left colon without evidence of acute diverticulitis. Vascular/Lymphatic: Known infrarenal abdominal aortic aneurysm has slightly increased in diameter. Using the same measurement techniques it measures 3.2 cm in AP dimension. Heavy calcified atherosclerotic disease of the aorta. Are 3 iliac stent graft unchanged. Reproductive: Uterus and bilateral adnexa are unremarkable. Other: No abdominal wall hernia or abnormality. No abdominopelvic ascites. Musculoskeletal: Spondylosis of the spine. IMPRESSION: Marked left perinephric inflammatory stranding and moderate left hydronephrosis. High density material fills the collecting system of the left kidney, query hemorrhagic or empyematous. There is a 3 mm left distal ureteral calculus, just upstream to the vesicoureteral junction, however the upstream ureter is not markedly dilated. Evidence of chronic left diverticulitis without evidence of acute pericolonic inflammatory changes. Known infrarenal abdominal aortic aneurysm has slightly increased in diameter measuring 3.2 cm in maximum AP dimension. Heavy calcific atherosclerotic disease of the aorta. Electronically Signed   By: Fidela Salisbury M.D.   On: 09/11/2018 18:12     Procedures Procedures (including critical care time)  Medications Ordered in ED Medications  0.9 %  sodium chloride infusion (has no administration in time range)  cefTRIAXone (ROCEPHIN) 1 g in sodium chloride 0.9 % 100 mL IVPB (has no administration in time range)  ondansetron (ZOFRAN-ODT) disintegrating tablet 4 mg (4 mg Oral Given 09/11/18 1656)  morphine 4 MG/ML injection 4 mg (4 mg Intravenous Given 09/11/18 1834)  ondansetron (ZOFRAN) injection 4 mg (4 mg Intravenous Given 09/11/18 1834)     Initial Impression / Assessment and Plan / ED Course  I have reviewed the triage vital signs and the nursing notes.  Pertinent labs & imaging results that were available during my care of the patient were reviewed by me and considered in my medical decision making (see chart for details).     History, physical and CT imaging today concerning for obstructive pyelonephritis.  Fluids, pain medicine and ceftriaxone have been ordered.  Discussed with Dr. Sabra Heck who agrees with plan.  Will repeat blood work and urine and then discuss with urology.  8:50 PM Called and spoke with urology, Dr. Tresa Moore.  Reviewed the CT images himself.  Did not see a kidney stone.  Noted a phlebolith in the pelvis.  Stated patient could be managed as pyelonephritis.  Page the hospitalist for admission.   Final Clinical Impressions(s) / ED Diagnoses   Final diagnoses:  None    ED Discharge Orders    None       Rachel Moulds 09/11/18 2152    Noemi Chapel, MD 09/12/18 1350

## 2018-09-11 NOTE — H&P (Signed)
Patient Demographics:    Jessica Taylor, is a 77 y.o. female  MRN: [id number]   DOB - 10-Mar-1941  Admit Date - 09/11/2018  Outpatient Primary MD for the patient is Antony Contras, MD   Assessment & Plan:    Principal Problem:   Lt Acute pyelonephritis Active Problems:   Essential hypertension   CAD S/P prior PCI to LAD and RCA   Hypothyroidism   PAD (peripheral artery disease) ([place])   Pyelonephritis    1)Lt sided acute pyelonephritis in the setting of urolithiasis and hematuria---EDP... Discussed case with on-call urologist Dr. Tammi Klippel who advised no need to transfer to [facility] long hospital as no urological intervention was required at this time... Treat empirically with IV Zosyn pending urine and blood culture results given prior history of Pseudomonas UTI, leukocytosis of 14.9 noted.  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 If hematuria persist we will have to discuss with cardiology and vascular surgery regarding de-escalation from dual antiplatelet therapy to single antiplatelet therapy  4) chronic anemia--- no prior colonoscopy, patient recently started on iron pills, may benefit from further GI work-up despite advanced age, continue iron supplementation  5)Hypothyroidism--continue levothyroxine 50 mcg daily  6)HTN--- BP is not at goal, continue metoprolol 25 mg twice daily,   may use IV Hydralazine 10 mg  Every 4 hours Prn for systolic blood pressure over 160  mmhg   With History of - Reviewed by me  Past Medical History:  Diagnosis Date  . Abdominal aortic aneurysm ([place])   . Acid reflux   . Anemia   . Arthritis    knees , R shoulder - tx /w injection - 11/2014  . Cancer ([place])    basal cell on nose  . Coronary artery disease   . Hypercholesteremia   . Hypertension   . Hypothyroidism   . MI, old 89  . Peripheral arterial disease ([facility])    hhigh-grade ostial bilateral calcified iliac stenosis with claudication  . Tobacco abuse   . Vertigo    when lays on left side.      Past Surgical History:  Procedure Laterality Date  . CARDIAC CATHETERIZATION  5 stents  . CARDIAC CATHETERIZATION N/A 01/20/2016   Procedure: Left Heart Cath and Coronary Angiography;  Surgeon: Peter M Martinique, MD;  Location: [place] [facility];  Service: Cardiovascular;  Laterality: N/A;  . CATARACT EXTRACTION W/PHACO Left 05/24/2014   Procedure: CATARACT EXTRACTION PHACO AND INTRAOCULAR LENS PLACEMENT (IOC);  Surgeon: Tonny Branch, MD;  Location: [facility];  Service: Ophthalmology;  Laterality: Left;  CDE:  9.30  . CATARACT EXTRACTION W/PHACO Right 06/18/2014   Procedure: CATARACT EXTRACTION PHACO AND INTRAOCULAR  LENS PLACEMENT RIGHT EYE CDE=10.84;  Surgeon: Tonny Branch, MD;  Location: [facility];  Service: Ophthalmology;  Laterality: Right;  . CORONARY ARTERY BYPASS GRAFT N/A 01/24/2016   Procedure: CORONARY ARTERY BYPASS GRAFTING (CABG);  Surgeon: Gaye Pollack, MD;  Location: [place];  Service: Open Heart Surgery;  Laterality: N/A;  . CORONARY STENT PLACEMENT    . CORONARY STENT PLACEMENT  03/06/15   CFX DES  . EYE SURGERY    . LEFT HEART CATHETERIZATION WITH CORONARY ANGIOGRAM N/A 03/06/2015   Procedure: LEFT HEART CATHETERIZATION WITH CORONARY ANGIOGRAM;  Surgeon: Lorretta Harp, MD;  Location: [facility];  Service: Cardiovascular;  Laterality: N/A;  . PARTIAL KNEE ARTHROPLASTY Right 02/04/2015   Procedure: UNICOMPARTMENTAL KNEE;  Surgeon: Dorna Leitz, MD;  Location: [place];   Service: Orthopedics;  Laterality: Right;  . PERIPHERAL VASCULAR CATHETERIZATION Bilateral 05/13/2015   Procedure: Lower Extremity Angiography;  Surgeon: Lorretta Harp, MD;  Location: [place] [facility];  Service: Cardiovascular;  Laterality: Bilateral;  . PERIPHERAL VASCULAR CATHETERIZATION N/A 05/13/2015   Procedure: Abdominal Aortogram;  Surgeon: Lorretta Harp, MD;  Location: [place] [facility];  Service: Cardiovascular;  Laterality: N/A;  . PERIPHERAL VASCULAR CATHETERIZATION Bilateral 06/27/2015   Procedure: Peripheral Vascular Intervention;  Surgeon: Lorretta Harp, MD;  Location: [place] [facility];  Service: Cardiovascular;  Laterality: Bilateral;  ILIACS  . PERIPHERAL VASCULAR CATHETERIZATION Bilateral 06/27/2015   Procedure: Peripheral Vascular Atherectomy;  Surgeon: Lorretta Harp, MD;  Location: [place] [facility];  Service: Cardiovascular;  Laterality: Bilateral;  . TEE WITHOUT CARDIOVERSION N/A 01/24/2016   Procedure: TRANSESOPHAGEAL ECHOCARDIOGRAM (TEE);  Surgeon: Gaye Pollack, MD;  Location: [place];  Service: Open Heart Surgery;  Laterality: N/A;  . TONSILLECTOMY     age 89  . TUBAL LIGATION        Chief Complaint  Patient presents with  . Abdominal Pain      HPI:    Jessica Taylor  is a 77 y.o. female  with a PMH of HTN, HLD, CAD, hypothyroidism, PAD on aspirin and Plavix returns to the ED for the second time in the last 2 days with complaints of left-sided flank pain.  Symptoms started on 09/09/2018 with hematuria and left-sided flank pain and dysuria.  Patient was seen in the ED on 09/10/2018 diagnosed with a UTI given Keflex prescription.    Pain is worse now patient has had emesis without bile or blood, hematuria has persisted.  Chills but no documented fevers.  No BM since 09/07/2018 when she started taking iron pills  In ED--- CT renal protocol indicates- Marked left perinephric inflammatory stranding and moderate left hydronephrosis. High density material fills the  collecting system of the left kidney, query hemorrhagic or empyematous. There is a 3 mm left distal ureteral calculus, just upstream to the vesicoureteral junction, however the upstream ureter is not markedly dilated.  EDP... Discussed case with on-call urologist Dr. Tammi Klippel who advised no need to transfer to [facility] long hospital as no urological intervention was required at this time...  Urine cultures from ED visit of 09/10/2018 pending, we have requested with prior history of Pseudomonas UTI  No chest pains, no palpitations, no dizziness, no pleuritic symptoms no leg pains or leg swelling  Additional history obtained from female and female family members at bedside  Patient with anemia, no prior colonoscopy  White count is up to 14.8 from 6.9 the previous day   Review of systems:    In  addition to the HPI above,   A full Review of  Systems was done, all other systems reviewed are negative except as noted above in HPI , .    Social History:  Reviewed by me    Social History   Tobacco Use  . Smoking status: Former Smoker    Packs/day: 1.50    Years: 42.00    Pack years: 63.00    Types: Cigarettes    Last attempt to quit: 05/19/1999    Years since quitting: 19.3  . Smokeless tobacco: Never Used  Substance Use Topics  . Alcohol use: No       Family History :  Reviewed by me    Family History  Problem Relation Age of Onset  . Cancer Mother 75  . Cancer Father 65  . Hypertension Maternal Grandmother   . Stroke Maternal Grandfather   . Hypertension Son   . Heart attack Neg Hx     Home Medications:   Prior to Admission medications   Medication Sig Start Date End Date Taking? Authorizing Provider  aspirin EC 81 MG tablet Take 81 mg by mouth daily.    Yes [provider]  atorvastatin (LIPITOR) 40 MG tablet TAKE (1) TABLET BY MOUTH ONCE DAILY. 05/09/18  Yes Burtis Junes, NP  cephALEXin (KEFLEX) 500 MG capsule Take 1 capsule (500 mg total) by mouth 4  (four) times daily. 09/10/18  Yes Davonna Belling, MD  clopidogrel (PLAVIX) 75 MG tablet TAKE (1) TABLET BY MOUTH ONCE DAILY. 08/03/18  Yes Burtis Junes, NP  ferrous sulfate 325 (65 FE) MG EC tablet Take 325 mg by mouth daily with breakfast.   Yes [provider]  HYDROcodone-acetaminophen (NORCO/VICODIN) 5-325 MG tablet Take 1 tablet by mouth 2 (two) times daily. 04/16/17  Yes [provider]  levothyroxine (SYNTHROID, LEVOTHROID) 50 MCG tablet Take 50 mcg by mouth daily before breakfast.   Yes [provider]  metoprolol tartrate (LOPRESSOR) 25 MG tablet TAKE 1 TABLET BY MOUTH TWICE DAILY. 06/06/18  Yes Burtis Junes, NP  nitroGLYCERIN (NITROSTAT) 0.4 MG SL tablet Place 1 tablet (0.4 mg total) under the tongue every 5 (five) minutes as needed for chest pain. 05/13/18  Yes Jerline Pain, MD  pantoprazole (PROTONIX) 40 MG tablet Take 40 mg by mouth 2 (two) times daily.  05/17/17  Yes [provider]     Allergies:     Allergies  Allergen Reactions  . Shrimp [Shellfish Allergy] Nausea And Vomiting    Throws up violently     Physical Exam:   Vitals  Blood pressure (!) 177/90, pulse 96, temperature (!) 97.4 F (36.3 C), temperature source Oral, resp. rate 18, height 5\' 3"  (1.6 m), weight 72.6 kg, SpO2 100 %.  Physical Examination: General appearance - alert, well appearing, and in no distress  Mental status - alert, oriented to person, place, and time,  Eyes - sclera anicteric Neck - supple, no JVD elevation , Chest - clear  to auscultation bilaterally, symmetrical air movement,  Heart - S1 and S2 normal, regular Abdomen - soft, left lower quadrant tenderness noted without rebound or guarding, nondistended, positive left flank tenderness/CVA area tenderness Neurological - screening mental status exam normal, neck supple without rigidity, cranial nerves II through XII intact, DTR's normal and symmetric Extremities - no pedal edema noted, intact  peripheral pulses  Skin - warm, dry     Data Review:    CBC Recent Labs  Lab 09/10/18 1939 09/11/18  1918  WBC 6.9 14.8*  HGB 9.4* 10.3*  HCT 34.8* 37.3  PLT 224 240  MCV 79.6* 80.4  MCH 21.5* 22.2*  MCHC 27.0* 27.6*  RDW 18.7* 19.4*  LYMPHSABS 1.8 0.6*  MONOABS 0.5 0.6  EOSABS 0.2 0.1  BASOSABS 0.1 0.1   ------------------------------------------------------------------------------------------------------------------  Chemistries  Recent Labs  Lab 09/10/18 1939 09/11/18 1918  NA 139 139  K 4.2 4.1  CL 108 106  CO2 25 25  GLUCOSE 96 149*  BUN 16 18  CREATININE 1.31* 1.68*  CALCIUM 9.3 9.5  AST 19  --   ALT 15  --   ALKPHOS 69  --   BILITOT 0.5  --    ------------------------------------------------------------------------------------------------------------------ estimated creatinine clearance is 25.9 mL/min (A) (by C-G formula based on SCr of 1.68 mg/dL (H)). ------------------------------------------------------------------------------------------------------------------ No results for input(s): TSH, T4TOTAL, T3FREE, THYROIDAB in the last 72 hours.  Invalid input(s): FREET3   Coagulation profile No results for input(s): INR, PROTIME in the last 168 hours. ------------------------------------------------------------------------------------------------------------------- No results for input(s): DDIMER in the last 72 hours. -------------------------------------------------------------------------------------------------------------------  Cardiac Enzymes No results for input(s): CKMB, TROPONINI, MYOGLOBIN in the last 168 hours.  Invalid input(s): CK ------------------------------------------------------------------------------------------------------------------    Component Value Date/Time   BNP 67.3 02/08/2015 2337     ---------------------------------------------------------------------------------------------------------------  Urinalysis      Component Value Date/Time   COLORURINE YELLOW 09/11/2018 1816   APPEARANCEUR HAZY (A) 09/11/2018 1816   LABSPEC 1.019 09/11/2018 1816   PHURINE 5.0 09/11/2018 1816   GLUCOSEU NEGATIVE 09/11/2018 1816   HGBUR LARGE (A) 09/11/2018 1816   BILIRUBINUR NEGATIVE 09/11/2018 1816   KETONESUR NEGATIVE 09/11/2018 1816   PROTEINUR 30 (A) 09/11/2018 1816   UROBILINOGEN 0.2 12/28/2014 1531   NITRITE NEGATIVE 09/11/2018 1816   LEUKOCYTESUR NEGATIVE 09/11/2018 1816    ----------------------------------------------------------------------------------------------------------------   Imaging Results:    Ct Renal Stone Study  Result Date: 09/11/2018 CLINICAL DATA:  Left upper abdominal pain radiating to the left flank. Nausea vomiting. EXAM: CT ABDOMEN AND PELVIS WITHOUT CONTRAST TECHNIQUE: Multidetector CT imaging of the abdomen and pelvis was performed following the standard protocol without IV contrast. COMPARISON:  02/21/2016 FINDINGS: Lower chest: No acute abnormality. Calcific atherosclerotic disease of the coronary arteries. Post CABG. Small hiatal hernia. Hepatobiliary: 11 mm right hepatic cyst.  Suspected cholelithiasis. Pancreas: Atrophic appearance of the pancreas. Spleen: Normal in size without focal abnormality. Adrenals/Urinary Tract: Normal adrenal glands. Normal appearance of the right kidney, right ureter and urinary bladder. Marked left perinephric stranding with moderate left hydronephrosis. High density material is present within the collecting system of the left kidney. There is a 3 mm distal left ureteral calculus just upstream of the vesicoureteral junction. Minimal upstream dilation of the left ureter. Stomach/Bowel: Stomach is within normal limits. No evidence of appendicitis. No evidence of bowel wall thickening, distention, or inflammatory changes. Marked left colonic diverticulosis with chronic appearing thickening of the left colon without evidence of acute diverticulitis.  Vascular/Lymphatic: Known infrarenal abdominal aortic aneurysm has slightly increased in diameter. Using the same measurement techniques it measures 3.2 cm in AP dimension. Heavy calcified atherosclerotic disease of the aorta. Are 3 iliac stent graft unchanged. Reproductive: Uterus and bilateral adnexa are unremarkable. Other: No abdominal wall hernia or abnormality. No abdominopelvic ascites. Musculoskeletal: Spondylosis of the spine. IMPRESSION: Marked left perinephric inflammatory stranding and moderate left hydronephrosis. High density material fills the collecting system of the left kidney, query hemorrhagic or empyematous. There is a 3 mm left distal ureteral calculus, just upstream to the  vesicoureteral junction, however the upstream ureter is not markedly dilated. Evidence of chronic left diverticulitis without evidence of acute pericolonic inflammatory changes. Known infrarenal abdominal aortic aneurysm has slightly increased in diameter measuring 3.2 cm in maximum AP dimension. Heavy calcific atherosclerotic disease of the aorta. Electronically Signed   By: Fidela Salisbury M.D.   On: 09/11/2018 18:12    Radiological Exams on Admission: Ct Renal Stone Study  Result Date: 09/11/2018 CLINICAL DATA:  Left upper abdominal pain radiating to the left flank. Nausea vomiting. EXAM: CT ABDOMEN AND PELVIS WITHOUT CONTRAST TECHNIQUE: Multidetector CT imaging of the abdomen and pelvis was performed following the standard protocol without IV contrast. COMPARISON:  02/21/2016 FINDINGS: Lower chest: No acute abnormality. Calcific atherosclerotic disease of the coronary arteries. Post CABG. Small hiatal hernia. Hepatobiliary: 11 mm right hepatic cyst.  Suspected cholelithiasis. Pancreas: Atrophic appearance of the pancreas. Spleen: Normal in size without focal abnormality. Adrenals/Urinary Tract: Normal adrenal glands. Normal appearance of the right kidney, right ureter and urinary bladder. Marked left  perinephric stranding with moderate left hydronephrosis. High density material is present within the collecting system of the left kidney. There is a 3 mm distal left ureteral calculus just upstream of the vesicoureteral junction. Minimal upstream dilation of the left ureter. Stomach/Bowel: Stomach is within normal limits. No evidence of appendicitis. No evidence of bowel wall thickening, distention, or inflammatory changes. Marked left colonic diverticulosis with chronic appearing thickening of the left colon without evidence of acute diverticulitis. Vascular/Lymphatic: Known infrarenal abdominal aortic aneurysm has slightly increased in diameter. Using the same measurement techniques it measures 3.2 cm in AP dimension. Heavy calcified atherosclerotic disease of the aorta. Are 3 iliac stent graft unchanged. Reproductive: Uterus and bilateral adnexa are unremarkable. Other: No abdominal wall hernia or abnormality. No abdominopelvic ascites. Musculoskeletal: Spondylosis of the spine. IMPRESSION: Marked left perinephric inflammatory stranding and moderate left hydronephrosis. High density material fills the collecting system of the left kidney, query hemorrhagic or empyematous. There is a 3 mm left distal ureteral calculus, just upstream to the vesicoureteral junction, however the upstream ureter is not markedly dilated. Evidence of chronic left diverticulitis without evidence of acute pericolonic inflammatory changes. Known infrarenal abdominal aortic aneurysm has slightly increased in diameter measuring 3.2 cm in maximum AP dimension. Heavy calcific atherosclerotic disease of the aorta. Electronically Signed   By: Fidela Salisbury M.D.   On: 09/11/2018 18:12    DVT Prophylaxis -SCD /Heparin AM Labs Ordered, also please review Full Orders  Family Communication: Admission, patients condition and plan of care including tests being ordered have been discussed with the patient  who indicate understanding and  agree with the plan   Code Status - Full Code  Likely DC to  Home  Condition   Stable  Roxan Hockey M.D on 09/11/2018 at 10:04 PM Pager---[phone] Go to [email or web address] - password [email or web address] for contact info  [facility] - Office  [phone]

## 2018-09-11 NOTE — ED Triage Notes (Addendum)
Patient brought in via EMS from home. Alert and oriented. Airway patent. Patient c/o left abd pain that radiates into left flank and back. Patient reports nausea and vomiting. Patient seen here in ER yesterday and diagnosed with UTI, given antibiotics in which she can not tolerate. Patient states EDP mentioned possible kidney stone but denies having CT.

## 2018-09-11 NOTE — ED Provider Notes (Signed)
The patient arrives by ambulance today complaining of left abdominal pain radiating into the back.  Yesterday had urinalysis consistent with infection however she now complains of increased pain and persistent vomiting throughout the evening.  On exam the patient has a soft abdomen which is not tender, she is uncomfortable, she is still nauseated.  Labs show that she has possible KS, urine looks cleaner Pt has ongoing sx of pain and vomiting  CT scan shows hydronephrosis, it looks like there may be some significant infection around the left-sided kidney consistent with pyelonephritis, no signs of emphysematous pyelonephritis.  Will need consult with urology.  Likely admission  Medical screening examination/treatment/procedure(s) were conducted as a shared visit with non-physician practitioner(s) and myself.  I personally evaluated the patient during the encounter.  Clinical Impression:   Final diagnoses:  Pyelonephritis         Jessica Chapel, MD 09/12/18 1350

## 2018-09-12 ENCOUNTER — Inpatient Hospital Stay (HOSPITAL_COMMUNITY): Payer: Medicare Other | Admitting: Certified Registered Nurse Anesthetist

## 2018-09-12 ENCOUNTER — Inpatient Hospital Stay (HOSPITAL_COMMUNITY): Payer: Medicare Other

## 2018-09-12 ENCOUNTER — Encounter (HOSPITAL_COMMUNITY)
Admission: EM | Disposition: A | Discharge status: Home / Self Care | Payer: Self-pay | Source: Home / Self Care | Attending: Internal Medicine

## 2018-09-12 ENCOUNTER — Encounter (HOSPITAL_COMMUNITY): Payer: Self-pay

## 2018-09-12 DIAGNOSIS — I10 Essential (primary) hypertension: Secondary | ICD-10-CM

## 2018-09-12 DIAGNOSIS — E038 Other specified hypothyroidism: Secondary | ICD-10-CM

## 2018-09-12 HISTORY — PX: CYSTOSCOPY/URETEROSCOPY/HOLMIUM LASER/STENT PLACEMENT: SHX6546

## 2018-09-12 LAB — URINE CULTURE: Culture: NO GROWTH

## 2018-09-12 LAB — CBC
HCT: 30.9 % — ABNORMAL LOW (ref 36.0–46.0)
HEMOGLOBIN: 8.5 g/dL — AB (ref 12.0–15.0)
MCH: 22.3 pg — AB (ref 26.0–34.0)
MCHC: 27.5 g/dL — ABNORMAL LOW (ref 30.0–36.0)
MCV: 81.1 fL (ref 80.0–100.0)
Platelets: 208 10*3/uL (ref 150–400)
RBC: 3.81 MIL/uL — AB (ref 3.87–5.11)
RDW: 19.2 % — ABNORMAL HIGH (ref 11.5–15.5)
WBC: 11.1 10*3/uL — ABNORMAL HIGH (ref 4.0–10.5)
nRBC: 0 % (ref 0.0–0.2)

## 2018-09-12 LAB — BASIC METABOLIC PANEL
ANION GAP: 6 (ref 5–15)
BUN: 19 mg/dL (ref 8–23)
CO2: 25 mmol/L (ref 22–32)
Calcium: 8.6 mg/dL — ABNORMAL LOW (ref 8.9–10.3)
Chloride: 109 mmol/L (ref 98–111)
Creatinine, Ser: 2.07 mg/dL — ABNORMAL HIGH (ref 0.44–1.00)
GFR calc Af Amer: 25 mL/min — ABNORMAL LOW (ref >60)
GFR calc non Af Amer: 22 mL/min — ABNORMAL LOW (ref >60)
GLUCOSE: 130 mg/dL — AB (ref 70–99)
Potassium: 4.7 mmol/L (ref 3.5–5.1)
Sodium: 140 mmol/L (ref 135–145)

## 2018-09-12 LAB — MRSA PCR SCREENING: MRSA by PCR: NEGATIVE

## 2018-09-12 SURGERY — CYSTOSCOPY/URETEROSCOPY/HOLMIUM LASER/STENT PLACEMENT
Anesthesia: General | Laterality: Left

## 2018-09-12 MED ORDER — CIPROFLOXACIN IN D5W 400 MG/200ML IV SOLN
400.0000 mg | INTRAVENOUS | Status: DC
  Administered 2018-09-13: 400 mg via INTRAVENOUS
  Filled 2018-09-12: qty 200

## 2018-09-12 MED ORDER — HYDROMORPHONE HCL 1 MG/ML IJ SOLN: 0.2500 mg | INTRAMUSCULAR | Status: DC | PRN

## 2018-09-12 MED ORDER — ACETAMINOPHEN 325 MG PO TABS: 325.0000 mg | ORAL_TABLET | ORAL | Status: DC | PRN

## 2018-09-12 MED ORDER — PHENYLEPHRINE HCL 10 MG/ML IJ SOLN
INTRAMUSCULAR | Status: AC
  Filled 2018-09-12: qty 1

## 2018-09-12 MED ORDER — SODIUM CHLORIDE 0.9 % IR SOLN
Status: DC | PRN
  Administered 2018-09-12: 6000 mL

## 2018-09-12 MED ORDER — ONDANSETRON HCL 4 MG/2ML IJ SOLN
INTRAMUSCULAR | Status: DC | PRN
  Administered 2018-09-12: 4 mg via INTRAVENOUS

## 2018-09-12 MED ORDER — PHENYLEPHRINE 40 MCG/ML (10ML) SYRINGE FOR IV PUSH (FOR BLOOD PRESSURE SUPPORT)
PREFILLED_SYRINGE | INTRAVENOUS | Status: AC
  Filled 2018-09-12: qty 10

## 2018-09-12 MED ORDER — SODIUM CHLORIDE 0.9 % IV SOLN
INTRAVENOUS | Status: DC | PRN
  Administered 2018-09-12: 25 ug/min via INTRAVENOUS

## 2018-09-12 MED ORDER — FENTANYL CITRATE (PF) 100 MCG/2ML IJ SOLN
INTRAMUSCULAR | Status: DC | PRN
  Administered 2018-09-12 (×3): 25 ug via INTRAVENOUS

## 2018-09-12 MED ORDER — FENTANYL CITRATE (PF) 100 MCG/2ML IJ SOLN
INTRAMUSCULAR | Status: AC
  Filled 2018-09-12: qty 2

## 2018-09-12 MED ORDER — ACETAMINOPHEN 160 MG/5ML PO SOLN: 325.0000 mg | ORAL | Status: DC | PRN

## 2018-09-12 MED ORDER — PROPOFOL 10 MG/ML IV BOLUS
INTRAVENOUS | Status: DC | PRN
  Administered 2018-09-12: 100 mg via INTRAVENOUS

## 2018-09-12 MED ORDER — LACTATED RINGERS IV SOLN
INTRAVENOUS | Status: DC
  Administered 2018-09-12: 17:00:00 via INTRAVENOUS

## 2018-09-12 MED ORDER — MEPERIDINE HCL 50 MG/ML IJ SOLN: 6.2500 mg | INTRAMUSCULAR | Status: DC | PRN

## 2018-09-12 MED ORDER — IOHEXOL 300 MG/ML  SOLN
INTRAMUSCULAR | Status: DC | PRN
  Administered 2018-09-12: 5 mL

## 2018-09-12 MED ORDER — LIDOCAINE HCL (CARDIAC) PF 100 MG/5ML IV SOSY
PREFILLED_SYRINGE | INTRAVENOUS | Status: DC | PRN
  Administered 2018-09-12: 50 mg via INTRAVENOUS

## 2018-09-12 MED ORDER — PHENYLEPHRINE HCL 10 MG/ML IJ SOLN
INTRAMUSCULAR | Status: DC | PRN
  Administered 2018-09-12 (×3): 80 ug via INTRAVENOUS

## 2018-09-12 MED ORDER — DEXAMETHASONE SODIUM PHOSPHATE 10 MG/ML IJ SOLN
INTRAMUSCULAR | Status: DC | PRN
  Administered 2018-09-12: 10 mg via INTRAVENOUS

## 2018-09-12 MED ORDER — PROMETHAZINE HCL 25 MG/ML IJ SOLN: 6.2500 mg | INTRAMUSCULAR | Status: DC | PRN

## 2018-09-12 SURGICAL SUPPLY — 24 items
BAG URO CATCHER STRL LF (MISCELLANEOUS) ×2 IMPLANT
BASKET ZERO TIP NITINOL 2.4FR (BASKET) IMPLANT
BSKT STON RTRVL ZERO TP 2.4FR (BASKET)
CATH URET 5FR 28IN OPEN ENDED (CATHETERS) ×1 IMPLANT
CLOTH BEACON ORANGE TIMEOUT ST (SAFETY) ×2 IMPLANT
CONT SPEC 4OZ CLIKSEAL STRL BL (MISCELLANEOUS) ×1 IMPLANT
COVER WAND RF STERILE (DRAPES) IMPLANT
EXTRACTOR STONE NITINOL NGAGE (UROLOGICAL SUPPLIES) IMPLANT
FIBER LASER FLEXIVA 365 (UROLOGICAL SUPPLIES) IMPLANT
FIBER LASER TRAC TIP (UROLOGICAL SUPPLIES) IMPLANT
GAUZE 4X4 16PLY RFD (DISPOSABLE) ×1 IMPLANT
GLOVE BIOGEL M STRL SZ7.5 (GLOVE) ×2 IMPLANT
GOWN STRL REUS W/TWL XL LVL3 (GOWN DISPOSABLE) ×2 IMPLANT
GUIDEWIRE STR DUAL SENSOR (WIRE) IMPLANT
GUIDEWIRE ZIPWRE .038 STRAIGHT (WIRE) ×5 IMPLANT
LEGGING LITHOTOMY PAIR STRL (DRAPES) ×1 IMPLANT
MANIFOLD NEPTUNE II (INSTRUMENTS) ×2 IMPLANT
PACK CYSTO (CUSTOM PROCEDURE TRAY) ×2 IMPLANT
SHEATH URETERAL 12FRX35CM (MISCELLANEOUS) IMPLANT
STENT CONTOUR 6FRX26X.038 (STENTS) IMPLANT
STENT URET 6FRX24 CONTOUR (STENTS) ×1 IMPLANT
TRAY FOLEY MTR SLVR 16FR STAT (SET/KITS/TRAYS/PACK) ×1 IMPLANT
TUBING CONNECTING 10 (TUBING) ×2 IMPLANT
TUBING UROLOGY SET (TUBING) ×2 IMPLANT

## 2018-09-12 NOTE — Anesthesia Preprocedure Evaluation (Addendum)
Anesthesia Evaluation  Patient identified by MRN, date of birth, ID band Patient awake    Reviewed: Allergy & Precautions, NPO status , Patient's Chart, lab work & pertinent test results, reviewed documented beta blocker date and time   Airway Mallampati: II  TM Distance: >3 FB Neck ROM: Full    Dental no notable dental hx. (+) Teeth Intact   Pulmonary former smoker,    breath sounds clear to auscultation       Cardiovascular hypertension, Pt. on home beta blockers + angina + CAD, + Past MI, + Peripheral Vascular Disease and + DVT   Rhythm:Regular Rate:Normal     Neuro/Psych negative neurological ROS  negative psych ROS   GI/Hepatic GERD  ,  Endo/Other  Hypothyroidism   Renal/GU Renal InsufficiencyRenal disease     Musculoskeletal  (+) Arthritis ,   Abdominal Normal abdominal exam  (+)   Peds  Hematology  (+) anemia ,   Anesthesia Other Findings   Reproductive/Obstetrics                             Anesthesia Physical  Anesthesia Plan  ASA: III  Anesthesia Plan: General   Post-op Pain Management:    Induction: Intravenous  PONV Risk Score and Plan: 4 or greater and Ondansetron and Dexamethasone  Airway Management Planned: LMA  Additional Equipment:   Intra-op Plan:   Post-operative Plan:   Informed Consent: I have reviewed the patients History and Physical, chart, labs and discussed the procedure including the risks, benefits and alternatives for the proposed anesthesia with the patient or authorized representative who has indicated his/her understanding and acceptance.   Dental advisory given  Plan Discussed with: CRNA, Anesthesiologist and Surgeon  Anesthesia Plan Comments:         Anesthesia Quick Evaluation

## 2018-09-12 NOTE — Anesthesia Procedure Notes (Signed)
Procedure Name: LMA Insertion Date/Time: 09/12/2018 5:05 PM Performed by: Glory Buff, CRNA Pre-anesthesia Checklist: Patient identified, Emergency Drugs available, Suction available and Patient being monitored Patient Re-evaluated:Patient Re-evaluated prior to induction Oxygen Delivery Method: Circle system utilized Preoxygenation: Pre-oxygenation with 100% oxygen Induction Type: IV induction LMA: LMA inserted LMA Size: 3.0 Number of attempts: 1 Placement Confirmation: positive ETCO2 Tube secured with: Tape Dental Injury: Teeth and Oropharynx as per pre-operative assessment

## 2018-09-12 NOTE — Transfer of Care (Signed)
Immediate Anesthesia Transfer of Care Note  Patient: Jessica Taylor  Procedure(s) Performed: CYSTOSCOPY/URETEROSCOPY/STENT PLACEMENT (Left )  Patient Location: PACU  Anesthesia Type:General  Level of Consciousness: awake, alert  and oriented  Airway & Oxygen Therapy: Patient Spontanous Breathing and Patient connected to face mask oxygen  Post-op Assessment: Report given to RN and Post -op Vital signs reviewed and stable  Post vital signs: Reviewed and stable  Last Vitals:  Vitals Value Taken Time  BP 126/57 09/12/2018  5:51 PM  Temp 37.6 C 09/12/2018  5:51 PM  Pulse 73 09/12/2018  5:52 PM  Resp 18 09/12/2018  5:52 PM  SpO2 99 % 09/12/2018  5:52 PM  Vitals shown include unvalidated device data.  Last Pain:  Vitals:   09/12/18 1631  TempSrc: Oral  PainSc:          Complications: No apparent anesthesia complications

## 2018-09-12 NOTE — Op Note (Signed)
Operative Note  Preoperative diagnosis:  1.  3 mm left distal ureteral calculus 2.  Left pyelonephritis 3.  Left renal colic 4.  Gross hematuria  Postoperative diagnosis: 1.  No evidence of left distal ureteral calculus 2.  Left pyelonephritis 3.  Left renal colic 4.  Gross hematuria  Procedure(s): 1.  Cystoscopy with left ureteroscopy and left JJ stent placement 2.  Left retrograde pyelogram with intraoperative interpretation of fluoroscopic imaging  Surgeon: Ellison Hughs, MD  Assistants:  None  Anesthesia:  General  Complications:  None  EBL: 5 mL  Specimens: 1.  Urine for culture and sensitivity  Drains/Catheters: 1.  Left 6 French by 24 cm JJ stent without tether 2.  16 French Foley catheter with 10 mL in the balloon  Intraoperative findings:   1. No evidence of left distal ureteral calculus seen on semirigid ureteroscopy 2. Following left JJ stent placement there was bloody debris expressed from the left collecting system 3. Left retrograde pyelogram revealed no obvious filling defects within the distal aspects of the left ureter or along the remainder of the left ureter.  There were filling defects within the left renal pelvis and many of the associated calyces, likely representing clot  Indication:  Jessica Taylor is a 77 y.o. female with ongoing left-sided flank pain, acute on chronic kidney failure and a recent CT suggestive of a 3 mm left distal ureteral calculus.  She has been consented for the above procedures, voices understanding wishes to proceed.  Description of procedure:  After informed consent was obtained, the patient was brought to the operating room and general LMA anesthesia was administered. The patient was then placed in the dorsolithotomy position and prepped and draped in usual sterile fashion. A timeout was performed. A 23 French rigid cystoscope was then inserted into the urethral meatus and advanced into the bladder under direct vision.  A complete bladder survey revealed no intravesical pathology.  A 5 French ureteral catheter was then inserted into the left ureteral orifice and a left retrograde pyelogram was obtained, with the findings listed above. A semirigid ureteroscope was then in advanced into the distal aspects of the left ureter, identifying no evidence of an obstructing stone.  A 6 French by 24 cm JJ stent was then placed over the wire and into good position within the left collecting system, confirming placement via fluoroscopy.  Following stent placement, there was bloody urine expressed from around the left JJ stent and from the ureteral orifice.  A urine specimen was collected and sent for culture and sensitivity.  A 16 French Foley catheter was placed to help with maximal drainage of her urinary tract.  She tolerated the procedure well and was transferred to the postanesthesia unit in stable condition.  Plan: Keep Foley catheter in place overnight.  Repeat BMP in the morning.  Continue IV Zosyn

## 2018-09-12 NOTE — Progress Notes (Signed)
PROGRESS NOTE    Jessica Taylor  JKD:[id number] DOB: December 02, 1940 DOA: 09/11/2018 PCP: Antony Contras, MD     Brief Narrative:  77 year old woman admitted from home on 10/20 with hematuria, left-sided flank pain, dysuria and emesis.  She was seen in the emergency department 2 days prior to admission with complaints of left-sided flank pain, was diagnosed with a UTI and sent home on oral antibiotic therapy.  She returns today because of no improvement and worsening.  In the ED she had a CT scan that showed marked left perinephric inflammatory stranding and left hydronephrosis with a 3 mm left distal ureteral calculus.  After discussing case with urology, Dr. Lovena Neighbours, decision has been made to transfer to [facility] for operative management of her stone.   Assessment & Plan:   Principal Problem:   Lt Acute pyelonephritis Active Problems:   Essential hypertension   CAD S/P prior PCI to LAD and RCA   Hypothyroidism   PAD (peripheral artery disease) (HCC)   Pyelonephritis   Left pyelonephritis -Secondary to obstructing ureteral stone. -Will transition antibiotics over to Cipro, she states pain has significantly improved overnight. -Leukocytosis has improved as well. -Continue IV fluids, will transfer to [facility] hospital today at urology's request for operative stone management.  Acute renal failure -Due to obstructing ureteral stone. -Urology is involved and is planning on operative management.  Hypothyroidism -Continue Synthroid  History of coronary artery disease -Stable, no chest pain, no shortness of breath. -Continue Lipitor, metoprolol. -With significant decrease in hemoglobin overnight and continued hematuria, will hold aspirin and Plavix for now.  History of hypertension -Blood pressure has actually been low normal in the 09X to 833 systolic. -Only medication is low-dose metoprolol which we will elect to continue for now.   DVT prophylaxis: SCDs Code  Status: Full code Family Communication: Discussed with husband and daughter at bedside updated on plan of care and all questions answered Disposition Plan: Transfer to [facility] for urologic care  Consultants:   Urology, Dr. Lovena Neighbours  Procedures:   None  Antimicrobials:  Anti-infectives (From admission, onward)   Start     Dose/Rate Route Frequency Ordered Stop   09/12/18 1800  ciprofloxacin (CIPRO) IVPB 400 mg     400 mg 200 mL/hr over 60 Minutes Intravenous Every 24 hours 09/12/18 1453     09/12/18 0400  piperacillin-tazobactam (ZOSYN) IVPB 3.375 g  Status:  Discontinued     3.375 g 12.5 mL/hr over 240 Minutes Intravenous Every 8 hours 09/11/18 2124 09/12/18 1453   09/11/18 2130  piperacillin-tazobactam (ZOSYN) IVPB 3.375 g     3.375 g 100 mL/hr over 30 Minutes Intravenous  Once 09/11/18 2124 09/11/18 2208   09/11/18 1900  cefTRIAXone (ROCEPHIN) 1 g in sodium chloride 0.9 % 100 mL IVPB     1 g 200 mL/hr over 30 Minutes Intravenous  Once 09/11/18 1854 09/11/18 2033       Subjective: Lying in bed, feels improved, still with left-sided flank and suprapubic pain, no emesis or nausea since admission.  Objective: Vitals:   09/11/18 2200 09/11/18 2231 09/12/18 0600 09/12/18 1222  BP: (!) 178/82 (!) 154/76 (!) 144/75 (!) 93/49  Pulse: 99 96 89 77  Resp:  19 18   Temp:  98.2 F (36.8 C) 99.1 F (37.3 C) 98.9 F (37.2 C)  TempSrc:  Oral Oral Oral  SpO2: 98% 100% 99% 92%  Weight:  73.9 kg    Height:  5\' 3"  (1.6 m)  Intake/Output Summary (Last 24 hours) at 09/12/2018 1517 Last data filed at 09/12/2018 1200 Gross per 24 hour  Intake 1745.55 ml  Output -  Net 1745.55 ml   Filed Weights   09/11/18 1633 09/11/18 2231  Weight: 72.6 kg 73.9 kg    Examination:  General exam: Alert, awake, oriented x 3 Respiratory system: Clear to auscultation. Respiratory effort normal. Cardiovascular system:RRR. No murmurs, rubs, gallops. Gastrointestinal system: Abdomen is  nondistended, soft and nontender. No organomegaly or masses felt. Normal bowel sounds heard. Central nervous system: Alert and oriented. No focal neurological deficits. Extremities: No C/C/E, +pedal pulses Skin: No rashes, lesions or ulcers Psychiatry: Judgement and insight appear normal. Mood & affect appropriate.     Data Reviewed: I have personally reviewed following labs and imaging studies  CBC: Recent Labs  Lab 09/10/18 1939 09/11/18 1918 09/12/18 0659  WBC 6.9 14.8* 11.1*  NEUTROABS 4.4 13.3*  --   HGB 9.4* 10.3* 8.5*  HCT 34.8* 37.3 30.9*  MCV 79.6* 80.4 81.1  PLT 224 240 409   Basic Metabolic Panel: Recent Labs  Lab 09/10/18 1939 09/11/18 1918 09/12/18 0659  NA 139 139 140  K 4.2 4.1 4.7  CL 108 106 109  CO2 25 25 25   GLUCOSE 96 149* 130*  BUN 16 18 19   CREATININE 1.31* 1.68* 2.07*  CALCIUM 9.3 9.5 8.6*   GFR: Estimated Creatinine Clearance: 21.2 mL/min (A) (by C-G formula based on SCr of 2.07 mg/dL (H)). Liver Function Tests: Recent Labs  Lab 09/10/18 1939  AST 19  ALT 15  ALKPHOS 69  BILITOT 0.5  PROT 7.0  ALBUMIN 3.9   No results for input(s): LIPASE, AMYLASE in the last 168 hours. No results for input(s): AMMONIA in the last 168 hours. Coagulation Profile: No results for input(s): INR, PROTIME in the last 168 hours. Cardiac Enzymes: No results for input(s): CKTOTAL, CKMB, CKMBINDEX, TROPONINI in the last 168 hours. BNP (last 3 results) No results for input(s): PROBNP in the last 8760 hours. HbA1C: No results for input(s): HGBA1C in the last 72 hours. CBG: No results for input(s): GLUCAP in the last 168 hours. Lipid Profile: No results for input(s): CHOL, HDL, LDLCALC, TRIG, CHOLHDL, LDLDIRECT in the last 72 hours. Thyroid Function Tests: No results for input(s): TSH, T4TOTAL, FREET4, T3FREE, THYROIDAB in the last 72 hours. Anemia Panel: No results for input(s): VITAMINB12, FOLATE, FERRITIN, TIBC, IRON, RETICCTPCT in the last 72  hours. Urine analysis:    Component Value Date/Time   COLORURINE YELLOW 09/11/2018 1816   APPEARANCEUR HAZY (A) 09/11/2018 1816   LABSPEC 1.019 09/11/2018 1816   PHURINE 5.0 09/11/2018 1816   GLUCOSEU NEGATIVE 09/11/2018 1816   HGBUR LARGE (A) 09/11/2018 1816   BILIRUBINUR NEGATIVE 09/11/2018 1816   KETONESUR NEGATIVE 09/11/2018 1816   PROTEINUR 30 (A) 09/11/2018 1816   UROBILINOGEN 0.2 12/28/2014 1531   NITRITE NEGATIVE 09/11/2018 1816   LEUKOCYTESUR NEGATIVE 09/11/2018 1816   Sepsis Labs: @LABRCNTIP (procalcitonin:4,lacticidven:4)  ) Recent Results (from the past 240 hour(s))  Urine culture     Status: None   Collection Time: 09/10/18  8:52 PM  Result Value Ref Range Status   Specimen Description   Final    URINE, CLEAN CATCH Performed at [facility], [address], [place], [place] [address]    Special Requests   Final    NONE Performed at [facility], [address] [place], [place] [address]    Culture   Final    NO GROWTH Performed at  [facility], [place] [address], [place], [place] [address]    Report Status 09/12/2018 FINAL  Final  Culture, blood (Routine X 2) w Reflex to ID Panel     Status: None (Preliminary result)   Collection Time: 09/11/18 10:56 PM  Result Value Ref Range Status   Specimen Description LEFT ANTECUBITAL  Final   Special Requests   Final    BOTTLES DRAWN AEROBIC AND ANAEROBIC Blood Culture adequate volume Performed at [facility], [address], [place], [place] [address]    Culture PENDING  Incomplete   Report Status PENDING  Incomplete         Radiology Studies: Ct Renal Stone Study  Result Date: 09/11/2018 CLINICAL DATA:  Left upper abdominal pain radiating to the left flank. Nausea vomiting. EXAM: CT ABDOMEN AND PELVIS WITHOUT CONTRAST TECHNIQUE: Multidetector CT imaging of the abdomen and pelvis was performed following the standard protocol without IV contrast. COMPARISON:  02/21/2016 FINDINGS: Lower chest: No acute  abnormality. Calcific atherosclerotic disease of the coronary arteries. Post CABG. Small hiatal hernia. Hepatobiliary: 11 mm right hepatic cyst.  Suspected cholelithiasis. Pancreas: Atrophic appearance of the pancreas. Spleen: Normal in size without focal abnormality. Adrenals/Urinary Tract: Normal adrenal glands. Normal appearance of the right kidney, right ureter and urinary bladder. Marked left perinephric stranding with moderate left hydronephrosis. High density material is present within the collecting system of the left kidney. There is a 3 mm distal left ureteral calculus just upstream of the vesicoureteral junction. Minimal upstream dilation of the left ureter. Stomach/Bowel: Stomach is within normal limits. No evidence of appendicitis. No evidence of bowel wall thickening, distention, or inflammatory changes. Marked left colonic diverticulosis with chronic appearing thickening of the left colon without evidence of acute diverticulitis. Vascular/Lymphatic: Known infrarenal abdominal aortic aneurysm has slightly increased in diameter. Using the same measurement techniques it measures 3.2 cm in AP dimension. Heavy calcified atherosclerotic disease of the aorta. Are 3 iliac stent graft unchanged. Reproductive: Uterus and bilateral adnexa are unremarkable. Other: No abdominal wall hernia or abnormality. No abdominopelvic ascites. Musculoskeletal: Spondylosis of the spine. IMPRESSION: Marked left perinephric inflammatory stranding and moderate left hydronephrosis. High density material fills the collecting system of the left kidney, query hemorrhagic or empyematous. There is a 3 mm left distal ureteral calculus, just upstream to the vesicoureteral junction, however the upstream ureter is not markedly dilated. Evidence of chronic left diverticulitis without evidence of acute pericolonic inflammatory changes. Known infrarenal abdominal aortic aneurysm has slightly increased in diameter measuring 3.2 cm in maximum  AP dimension. Heavy calcific atherosclerotic disease of the aorta. Electronically Signed   By: Fidela Salisbury M.D.   On: 09/11/2018 18:12        Scheduled Meds: . aspirin EC  81 mg Oral Daily  . atorvastatin  40 mg Oral q1800  . clopidogrel  75 mg Oral Daily  . ferrous sulfate  325 mg Oral Q breakfast  . heparin  5,000 Units Subcutaneous Q8H  . levothyroxine  50 mcg Oral Q0600  . metoprolol tartrate  25 mg Oral BID  . pantoprazole  40 mg Oral BID  . senna-docusate  2 tablet Oral BID  . sodium chloride flush  3 mL Intravenous Q12H   Continuous Infusions: . sodium chloride    . sodium chloride 50 mL/hr at 09/12/18 0500  . ciprofloxacin       LOS: 1 day    Time spent: 35 minutes. Greater than 50% of this time was spent in direct contact  with the patient and with patient's family, coordinating care and discussing relevant ongoing clinical issues, including continued plans for antibiotic therapy, plan to transfer to [facility] for urology care given pyelonephritis and worsening renal function with ureteral stone and hydronephrosis.     Lelon Frohlich, MD [facility] Pager [phone]  If 7PM-7AM, please contact night-coverage [email or web address] Password [email or web address] 09/12/2018, 3:17 PM

## 2018-09-12 NOTE — Progress Notes (Signed)
Pt transferred from [facility] to [phone]. Pt AOx4 and made comfortable in room. Pt prepped for surgery and OR was informed by RN that pt was ready to be taken down.

## 2018-09-12 NOTE — Consult Note (Signed)
Urology Consult   Physician requesting consult: Lelon Frohlich, MD  Reason for consult: Left ureteral stone and AKI  History of Present Illness: Jessica Taylor is a 77 y.o. with a 3 mm left UVJ stone, left renal colic and hematuria for the past 72 hours.   Despite conservative management and IVF resuscitation, her renal function has failed to improve (baseline creatinine is around 1.3--current creatinine is 2.07).    She describes her pain as intermittent, sharp and radiates into the left inguinal region.  She denies N/V/F/C today.    The patient denies a history of voiding or storage urinary symptoms, hematuria, UTIs, STDs, urolithiasis, GU malignancy/trauma/surgery.  Past Medical History:  Diagnosis Date  . Abdominal aortic aneurysm ([place])   . Acid reflux   . Anemia   . Arthritis    knees , R shoulder - tx /w injection - 11/2014  . Cancer ([place])    basal cell on nose  . Coronary artery disease   . Hypercholesteremia   . Hypertension   . Hypothyroidism   . 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 Peripheral arterial disease ([facility])    hhigh-grade ostial bilateral calcified iliac stenosis with claudication  . Tobacco abuse   . Vertigo    when lays on left side.    Past Surgical History:  Procedure Laterality Date  . CARDIAC CATHETERIZATION  5 stents  . CARDIAC CATHETERIZATION N/A 01/20/2016   Procedure: Left Heart Cath and Coronary Angiography;  Surgeon: Peter M Martinique, MD;  Location: [place] [facility];  Service: Cardiovascular;  Laterality: N/A;  . CATARACT EXTRACTION W/PHACO Left 05/24/2014   Procedure: CATARACT EXTRACTION PHACO AND INTRAOCULAR LENS PLACEMENT (IOC);  Surgeon: Tonny Branch, MD;  Location: [facility];  Service: Ophthalmology;  Laterality: Left;  CDE:  9.30  . CATARACT EXTRACTION W/PHACO Right 06/18/2014   Procedure: CATARACT EXTRACTION PHACO AND INTRAOCULAR LENS PLACEMENT RIGHT EYE CDE=10.84;  Surgeon: Tonny Branch, MD;  Location: [facility];  Service: Ophthalmology;  Laterality: Right;  .  CORONARY ARTERY BYPASS GRAFT N/A 01/24/2016   Procedure: CORONARY ARTERY BYPASS GRAFTING (CABG);  Surgeon: Gaye Pollack, MD;  Location: [place];  Service: Open Heart Surgery;  Laterality: N/A;  . CORONARY STENT PLACEMENT    . CORONARY STENT PLACEMENT  03/06/15   CFX DES  . EYE SURGERY    . LEFT HEART CATHETERIZATION WITH CORONARY ANGIOGRAM N/A 03/06/2015   Procedure: LEFT HEART CATHETERIZATION WITH CORONARY ANGIOGRAM;  Surgeon: Lorretta Harp, MD;  Location: [facility];  Service: Cardiovascular;  Laterality: N/A;  . PARTIAL KNEE ARTHROPLASTY Right 02/04/2015   Procedure: UNICOMPARTMENTAL KNEE;  Surgeon: Dorna Leitz, MD;  Location: [place];  Service: Orthopedics;  Laterality: Right;  . PERIPHERAL VASCULAR CATHETERIZATION Bilateral 05/13/2015   Procedure: Lower Extremity Angiography;  Surgeon: Lorretta Harp, MD;  Location: [place] [facility];  Service: Cardiovascular;  Laterality: Bilateral;  . PERIPHERAL VASCULAR CATHETERIZATION N/A 05/13/2015   Procedure: Abdominal Aortogram;  Surgeon: Lorretta Harp, MD;  Location: [place] [facility];  Service: Cardiovascular;  Laterality: N/A;  . PERIPHERAL VASCULAR CATHETERIZATION Bilateral 06/27/2015   Procedure: Peripheral Vascular Intervention;  Surgeon: Lorretta Harp, MD;  Location: [place] [facility];  Service: Cardiovascular;  Laterality: Bilateral;  ILIACS  . PERIPHERAL VASCULAR CATHETERIZATION Bilateral 06/27/2015   Procedure: Peripheral Vascular Atherectomy;  Surgeon: Lorretta Harp, MD;  Location: [place] [facility];  Service: Cardiovascular;  Laterality: Bilateral;  . TEE WITHOUT CARDIOVERSION N/A 01/24/2016   Procedure: TRANSESOPHAGEAL ECHOCARDIOGRAM (  TEE);  Surgeon: Gaye Pollack, MD;  Location: [place];  Service: Open Heart Surgery;  Laterality: N/A;  . TONSILLECTOMY     age 50  . TUBAL LIGATION      Current Hospital Medications:  Home Meds:  Current Facility-Administered Medications for the 09/11/18 encounter [facility] Encounter)   Medication  . famotidine (PEPCID) 20 mg in sodium chloride 0.9 % 50 mL IVPB   Current Meds  Medication Sig  . aspirin EC 81 MG tablet Take 81 mg by mouth daily.   Marland Kitchen atorvastatin (LIPITOR) 40 MG tablet TAKE (1) TABLET BY MOUTH ONCE DAILY.  . cephALEXin (KEFLEX) 500 MG capsule Take 1 capsule (500 mg total) by mouth 4 (four) times daily.  . clopidogrel (PLAVIX) 75 MG tablet TAKE (1) TABLET BY MOUTH ONCE DAILY.  . ferrous sulfate 325 (65 FE) MG EC tablet Take 325 mg by mouth daily with breakfast.  . HYDROcodone-acetaminophen (NORCO/VICODIN) 5-325 MG tablet Take 1 tablet by mouth 2 (two) times daily.  Marland Kitchen levothyroxine (SYNTHROID, LEVOTHROID) 50 MCG tablet Take 50 mcg by mouth daily before breakfast.  . metoprolol tartrate (LOPRESSOR) 25 MG tablet TAKE 1 TABLET BY MOUTH TWICE DAILY.  . nitroGLYCERIN (NITROSTAT) 0.4 MG SL tablet Place 1 tablet (0.4 mg total) under the tongue every 5 (five) minutes as needed for chest pain.  . pantoprazole (PROTONIX) 40 MG tablet Take 40 mg by mouth 2 (two) times daily.     Scheduled Meds: . [MAR Hold] atorvastatin  40 mg Oral q1800  . [MAR Hold] ferrous sulfate  325 mg Oral Q breakfast  . [MAR Hold] heparin  5,000 Units Subcutaneous Q8H  . [MAR Hold] levothyroxine  50 mcg Oral Q0600  . [MAR Hold] metoprolol tartrate  25 mg Oral BID  . [MAR Hold] pantoprazole  40 mg Oral BID  . [MAR Hold] senna-docusate  2 tablet Oral BID  . [MAR Hold] sodium chloride flush  3 mL Intravenous Q12H   Continuous Infusions: . [MAR Hold] sodium chloride    . sodium chloride 50 mL/hr at 09/12/18 0500  . [MAR Hold] ciprofloxacin    . lactated ringers     PRN Meds:.[MAR Hold] sodium chloride, [MAR Hold] acetaminophen **OR** [MAR Hold] acetaminophen, [MAR Hold] albuterol, [MAR Hold] hydrALAZINE, [MAR Hold]  morphine injection, [MAR Hold] nitroGLYCERIN, [MAR Hold] ondansetron **OR** [MAR Hold] ondansetron (ZOFRAN) IV, [MAR Hold] oxyCODONE, [MAR Hold] polyethylene glycol, [MAR Hold]  sodium chloride flush, [MAR Hold] traZODone  Allergies:  Allergies  Allergen Reactions  . Shrimp [Shellfish Allergy] Nausea And Vomiting    Throws up violently    Family History  Problem Relation Age of Onset  . Cancer Mother 30  . Cancer Father 36  . Hypertension Maternal Grandmother   . Stroke Maternal Grandfather   . Hypertension Son   . Heart attack Neg Hx     Social History:  reports that she quit smoking about 19 years ago. Her smoking use included cigarettes. She has a 63.00 pack-year smoking history. She has never used smokeless tobacco. She reports that she does not drink alcohol or use drugs.  ROS: A complete review of systems was performed.  All systems are negative except for pertinent findings as noted.  Physical Exam:  Vital signs in last 24 hours: Temp:  [98.2 F (36.8 C)-99.1 F (37.3 C)] 98.4 F (36.9 C) (10/21 1631) Pulse Rate:  [69-99] 69 (10/21 1631) Resp:  [18-19] 18 (10/21 1631) BP: (93-193)/(49-90) 110/51 (10/21 1631) SpO2:  [92 %-100 %] 92 % (  10/21 1631) Weight:  [73.9 kg-75 kg] 75 kg (10/21 1535) Constitutional:  Alert and oriented, No acute distress Cardiovascular: Regular rate and rhythm, No JVD Respiratory: Normal respiratory effort, Lungs clear bilaterally GI: Abdomen is soft, nontender, nondistended, no abdominal masses GU: No CVA tenderness Lymphatic: No lymphadenopathy Neurologic: Grossly intact, no focal deficits Psychiatric: Normal mood and affect  Laboratory Data:  Recent Labs    09/10/18 1939 09/11/18 1918 09/12/18 0659  WBC 6.9 14.8* 11.1*  HGB 9.4* 10.3* 8.5*  HCT 34.8* 37.3 30.9*  PLT 224 240 208    Recent Labs    09/10/18 1939 09/11/18 1918 09/12/18 0659  NA 139 139 140  K 4.2 4.1 4.7  CL 108 106 109  GLUCOSE 96 149* 130*  BUN 16 18 19   CALCIUM 9.3 9.5 8.6*  CREATININE 1.31* 1.68* 2.07*     Results for orders placed or performed during the hospital encounter of 09/11/18 (from the past 24 hour(s))   Urinalysis, Routine w reflex microscopic     Status: Abnormal   Collection Time: 09/11/18  6:16 PM  Result Value Ref Range   Color, Urine YELLOW YELLOW   APPearance HAZY (A) CLEAR   Specific Gravity, Urine 1.019 1.005 - 1.030   pH 5.0 5.0 - 8.0   Glucose, UA NEGATIVE NEGATIVE mg/dL   Hgb urine dipstick LARGE (A) NEGATIVE   Bilirubin Urine NEGATIVE NEGATIVE   Ketones, ur NEGATIVE NEGATIVE mg/dL   Protein, ur 30 (A) NEGATIVE mg/dL   Nitrite NEGATIVE NEGATIVE   Leukocytes, UA NEGATIVE NEGATIVE   RBC / HPF >50 (H) 0 - 5 RBC/hpf   WBC, UA 6-10 0 - 5 WBC/hpf   Bacteria, UA NONE SEEN NONE SEEN   Squamous Epithelial / LPF 0-5 0 - 5   Mucus PRESENT   CBC with Differential     Status: Abnormal   Collection Time: 09/11/18  7:18 PM  Result Value Ref Range   WBC 14.8 (H) 4.0 - 10.5 K/uL   RBC 4.64 3.87 - 5.11 MIL/uL   Hemoglobin 10.3 (L) 12.0 - 15.0 g/dL   HCT 37.3 36.0 - 46.0 %   MCV 80.4 80.0 - 100.0 fL   MCH 22.2 (L) 26.0 - 34.0 pg   MCHC 27.6 (L) 30.0 - 36.0 g/dL   RDW 19.4 (H) 11.5 - 15.5 %   Platelets 240 150 - 400 K/uL   nRBC 0.0 0.0 - 0.2 %   Neutrophils Relative % 90 %   Neutro Abs 13.3 (H) 1.7 - 7.7 K/uL   Lymphocytes Relative 4 %   Lymphs Abs 0.6 (L) 0.7 - 4.0 K/uL   Monocytes Relative 4 %   Monocytes Absolute 0.6 0.1 - 1.0 K/uL   Eosinophils Relative 1 %   Eosinophils Absolute 0.1 0.0 - 0.5 K/uL   Basophils Relative 0 %   Basophils Absolute 0.1 0.0 - 0.1 K/uL   Immature Granulocytes 1 %   Abs Immature Granulocytes 0.07 0.00 - 0.07 K/uL  Basic metabolic panel     Status: Abnormal   Collection Time: 09/11/18  7:18 PM  Result Value Ref Range   Sodium 139 135 - 145 mmol/L   Potassium 4.1 3.5 - 5.1 mmol/L   Chloride 106 98 - 111 mmol/L   CO2 25 22 - 32 mmol/L   Glucose, Bld 149 (H) 70 - 99 mg/dL   BUN 18 8 - 23 mg/dL   Creatinine, Ser 1.68 (H) 0.44 - 1.00 mg/dL   Calcium 9.5 8.9 -  10.3 mg/dL   GFR calc non Af Amer 28 (L) >60 mL/min   GFR calc Af Amer 32 (L) >60  mL/min   Anion gap 8 5 - 15  Culture, blood (Routine X 2) w Reflex to ID Panel     Status: None (Preliminary result)   Collection Time: 09/11/18 10:56 PM  Result Value Ref Range   Specimen Description LEFT ANTECUBITAL    Special Requests      BOTTLES DRAWN AEROBIC AND ANAEROBIC Blood Culture adequate volume Performed at [facility], [address], [place], [place] [address]    Culture PENDING    Report Status PENDING   Basic metabolic panel     Status: Abnormal   Collection Time: 09/12/18  6:59 AM  Result Value Ref Range   Sodium 140 135 - 145 mmol/L   Potassium 4.7 3.5 - 5.1 mmol/L   Chloride 109 98 - 111 mmol/L   CO2 25 22 - 32 mmol/L   Glucose, Bld 130 (H) 70 - 99 mg/dL   BUN 19 8 - 23 mg/dL   Creatinine, Ser 2.07 (H) 0.44 - 1.00 mg/dL   Calcium 8.6 (L) 8.9 - 10.3 mg/dL   GFR calc non Af Amer 22 (L) >60 mL/min   GFR calc Af Amer 25 (L) >60 mL/min   Anion gap 6 5 - 15  CBC     Status: Abnormal   Collection Time: 09/12/18  6:59 AM  Result Value Ref Range   WBC 11.1 (H) 4.0 - 10.5 K/uL   RBC 3.81 (L) 3.87 - 5.11 MIL/uL   Hemoglobin 8.5 (L) 12.0 - 15.0 g/dL   HCT 30.9 (L) 36.0 - 46.0 %   MCV 81.1 80.0 - 100.0 fL   MCH 22.3 (L) 26.0 - 34.0 pg   MCHC 27.5 (L) 30.0 - 36.0 g/dL   RDW 19.2 (H) 11.5 - 15.5 %   Platelets 208 150 - 400 K/uL   nRBC 0.0 0.0 - 0.2 %   Recent Results (from the past 240 hour(s))  Urine culture     Status: None   Collection Time: 09/10/18  8:52 PM  Result Value Ref Range Status   Specimen Description   Final    URINE, CLEAN CATCH Performed at [facility], [address] [place], [place] [address]    Special Requests   Final    NONE Performed at [facility], [address] [place], [place] [address]    Culture   Final    NO GROWTH Performed at [facility], [address], [place], [place] [address]    Report Status 09/12/2018 FINAL  Final  Culture, blood (Routine X 2) w Reflex to ID Panel     Status: None (Preliminary result)    Collection Time: 09/11/18 10:56 PM  Result Value Ref Range Status   Specimen Description LEFT ANTECUBITAL  Final   Special Requests   Final    BOTTLES DRAWN AEROBIC AND ANAEROBIC Blood Culture adequate volume Performed at [facility], [address], [place], [place] [address]    Culture PENDING  Incomplete   Report Status PENDING  Incomplete    Renal Function: Recent Labs    09/10/18 1939 09/11/18 1918 09/12/18 0659  CREATININE 1.31* 1.68* 2.07*   Estimated Creatinine Clearance: 21.4 mL/min (A) (by C-G formula based on SCr of 2.07 mg/dL (H)).  Radiologic Imaging: Ct Renal Stone Study  Result Date: 09/11/2018 CLINICAL DATA:  Left upper abdominal pain radiating to the left flank.  Nausea vomiting. EXAM: CT ABDOMEN AND PELVIS WITHOUT CONTRAST TECHNIQUE: Multidetector CT imaging of the abdomen and pelvis was performed following the standard protocol without IV contrast. COMPARISON:  02/21/2016 FINDINGS: Lower chest: No acute abnormality. Calcific atherosclerotic disease of the coronary arteries. Post CABG. Small hiatal hernia. Hepatobiliary: 11 mm right hepatic cyst.  Suspected cholelithiasis. Pancreas: Atrophic appearance of the pancreas. Spleen: Normal in size without focal abnormality. Adrenals/Urinary Tract: Normal adrenal glands. Normal appearance of the right kidney, right ureter and urinary bladder. Marked left perinephric stranding with moderate left hydronephrosis. High density material is present within the collecting system of the left kidney. There is a 3 mm distal left ureteral calculus just upstream of the vesicoureteral junction. Minimal upstream dilation of the left ureter. Stomach/Bowel: Stomach is within normal limits. No evidence of appendicitis. No evidence of bowel wall thickening, distention, or inflammatory changes. Marked left colonic diverticulosis with chronic appearing thickening of the left colon without evidence of acute diverticulitis. Vascular/Lymphatic: Known  infrarenal abdominal aortic aneurysm has slightly increased in diameter. Using the same measurement techniques it measures 3.2 cm in AP dimension. Heavy calcified atherosclerotic disease of the aorta. Are 3 iliac stent graft unchanged. Reproductive: Uterus and bilateral adnexa are unremarkable. Other: No abdominal wall hernia or abnormality. No abdominopelvic ascites. Musculoskeletal: Spondylosis of the spine. IMPRESSION: Marked left perinephric inflammatory stranding and moderate left hydronephrosis. High density material fills the collecting system of the left kidney, query hemorrhagic or empyematous. There is a 3 mm left distal ureteral calculus, just upstream to the vesicoureteral junction, however the upstream ureter is not markedly dilated. Evidence of chronic left diverticulitis without evidence of acute pericolonic inflammatory changes. Known infrarenal abdominal aortic aneurysm has slightly increased in diameter measuring 3.2 cm in maximum AP dimension. Heavy calcific atherosclerotic disease of the aorta. Electronically Signed   By: Fidela Salisbury M.D.   On: 09/11/2018 18:12    I independently reviewed the above imaging studies.  Impression/Recommendation 77 year old female with a 3 mm left UVJ stone, left renal colic and acute on chronic renal failure  -The risks, benefits and alternatives of cystoscopy with LEFT ureteroscopy, laser lithotripsy and ureteral stent placement was discussed the patient.  Risks included, but are not limited to: bleeding, urinary tract infection, ureteral injury/avulsion, ureteral stricture formation, retained stone fragments, the possibility that multiple surgeries may be required to treat the stone(s), MI, stroke, PE and the inherent risks of general anesthesia.  The patient voices understanding and wishes to proceed.      Ellison Hughs, MD [facility] 09/12/2018, 4:37 PM

## 2018-09-12 NOTE — Anesthesia Postprocedure Evaluation (Signed)
Anesthesia Post Note  Patient: Jessica Taylor  Procedure(s) Performed: CYSTOSCOPY/URETEROSCOPY/STENT PLACEMENT (Left )     Patient location during evaluation: PACU Anesthesia Type: General Level of consciousness: awake and sedated Pain management: pain level controlled Vital Signs Assessment: post-procedure vital signs reviewed and stable Respiratory status: spontaneous breathing Cardiovascular status: stable Postop Assessment: no apparent nausea or vomiting Anesthetic complications: no    Last Vitals:  Vitals:   09/12/18 1815 09/12/18 1830  BP: (!) 127/56 (!) 122/56  Pulse: 72 71  Resp: 17 14  Temp:  37.4 C  SpO2: 99% 98%    Last Pain:  Vitals:   09/12/18 1830  TempSrc:   PainSc: 0-No pain   Pain Goal:                 Huston Foley

## 2018-09-13 ENCOUNTER — Encounter (HOSPITAL_COMMUNITY): Payer: Self-pay | Admitting: Urology

## 2018-09-13 DIAGNOSIS — N170 Acute kidney failure with tubular necrosis: Secondary | ICD-10-CM

## 2018-09-13 DIAGNOSIS — D509 Iron deficiency anemia, unspecified: Secondary | ICD-10-CM | POA: Diagnosis present

## 2018-09-13 DIAGNOSIS — N182 Chronic kidney disease, stage 2 (mild): Secondary | ICD-10-CM | POA: Diagnosis present

## 2018-09-13 DIAGNOSIS — D62 Acute posthemorrhagic anemia: Secondary | ICD-10-CM | POA: Diagnosis present

## 2018-09-13 LAB — CBC
HCT: 30.6 % — ABNORMAL LOW (ref 36.0–46.0)
HEMOGLOBIN: 8.3 g/dL — AB (ref 12.0–15.0)
MCH: 22.1 pg — AB (ref 26.0–34.0)
MCHC: 27.1 g/dL — ABNORMAL LOW (ref 30.0–36.0)
MCV: 81.4 fL (ref 80.0–100.0)
PLATELETS: 202 10*3/uL (ref 150–400)
RBC: 3.76 MIL/uL — AB (ref 3.87–5.11)
RDW: 19.8 % — ABNORMAL HIGH (ref 11.5–15.5)
WBC: 10 10*3/uL (ref 4.0–10.5)
nRBC: 0 % (ref 0.0–0.2)

## 2018-09-13 LAB — IRON AND TIBC
Iron: 38 ug/dL (ref 28–170)
Saturation Ratios: 12 % (ref 10.4–31.8)
TIBC: 319 ug/dL (ref 250–450)
UIBC: 281 ug/dL

## 2018-09-13 LAB — BASIC METABOLIC PANEL
ANION GAP: 6 (ref 5–15)
BUN: 25 mg/dL — ABNORMAL HIGH (ref 8–23)
CALCIUM: 8.8 mg/dL — AB (ref 8.9–10.3)
CO2: 23 mmol/L (ref 22–32)
Chloride: 113 mmol/L — ABNORMAL HIGH (ref 98–111)
Creatinine, Ser: 2.04 mg/dL — ABNORMAL HIGH (ref 0.44–1.00)
GFR calc Af Amer: 26 mL/min — ABNORMAL LOW (ref >60)
GFR calc non Af Amer: 22 mL/min — ABNORMAL LOW (ref >60)
GLUCOSE: 129 mg/dL — AB (ref 70–99)
Potassium: 4.5 mmol/L (ref 3.5–5.1)
Sodium: 142 mmol/L (ref 135–145)

## 2018-09-13 MED ORDER — SODIUM CHLORIDE 0.9 % IV SOLN
1.0000 g | INTRAVENOUS | Status: DC
  Administered 2018-09-13 – 2018-09-14 (×2): 1 g via INTRAVENOUS
  Filled 2018-09-13: qty 10
  Filled 2018-09-13 (×2): qty 1

## 2018-09-13 NOTE — Progress Notes (Signed)
1 Day Post-Op Subjective: No acute events overnight.  Feeling much better this AM.  Denies flank pain, nausea or fever/chills. Her renal function is stable and urine output is slowly improving.   Objective: Vital signs in last 24 hours: Temp:  [97.7 F (36.5 C)-99.7 F (37.6 C)] 97.7 F (36.5 C) (10/22 0457) Pulse Rate:  [69-112] 69 (10/22 0457) Resp:  [1-18] 1 (10/22 0457) BP: (93-141)/(49-59) 121/59 (10/22 0457) SpO2:  [92 %-100 %] 96 % (10/22 0457) Weight:  [75 kg] 75 kg (10/21 1535)  Intake/Output from previous day: 10/21 0701 - 10/22 0700 In: 1026.1 [I.V.:950.1; IV Piggyback:76] Out: 245 [Urine:245]  Intake/Output this shift: Total I/O In: 240 [P.O.:240] Out: -   Physical Exam:  General: Alert and oriented CV: RRR, palpable distal pulses Lungs: CTAB, equal chest rise Abdomen: Soft, NTND, no rebound or guarding Gu: Her Foley is draining dark, bloody urine with no clots.  Ext: NT, No erythema  Lab Results: Recent Labs    09/11/18 1918 09/12/18 0659 09/13/18 0511  HGB 10.3* 8.5* 8.3*  HCT 37.3 30.9* 30.6*   BMET Recent Labs    09/12/18 0659 09/13/18 0511  NA 140 142  K 4.7 4.5  CL 109 113*  CO2 25 23  GLUCOSE 130* 129*  BUN 19 25*  CREATININE 2.07* 2.04*  CALCIUM 8.6* 8.8*     Studies/Results: Dg C-arm 1-60 Min-no Report  Result Date: 09/12/2018 Fluoroscopy was utilized by the requesting physician.  No radiographic interpretation.   Ct Renal Stone Study  Result Date: 09/11/2018 CLINICAL DATA:  Left upper abdominal pain radiating to the left flank. Nausea vomiting. EXAM: CT ABDOMEN AND PELVIS WITHOUT CONTRAST TECHNIQUE: Multidetector CT imaging of the abdomen and pelvis was performed following the standard protocol without IV contrast. COMPARISON:  02/21/2016 FINDINGS: Lower chest: No acute abnormality. Calcific atherosclerotic disease of the coronary arteries. Post CABG. Small hiatal hernia. Hepatobiliary: 11 mm right hepatic cyst.  Suspected  cholelithiasis. Pancreas: Atrophic appearance of the pancreas. Spleen: Normal in size without focal abnormality. Adrenals/Urinary Tract: Normal adrenal glands. Normal appearance of the right kidney, right ureter and urinary bladder. Marked left perinephric stranding with moderate left hydronephrosis. High density material is present within the collecting system of the left kidney. There is a 3 mm distal left ureteral calculus just upstream of the vesicoureteral junction. Minimal upstream dilation of the left ureter. Stomach/Bowel: Stomach is within normal limits. No evidence of appendicitis. No evidence of bowel wall thickening, distention, or inflammatory changes. Marked left colonic diverticulosis with chronic appearing thickening of the left colon without evidence of acute diverticulitis. Vascular/Lymphatic: Known infrarenal abdominal aortic aneurysm has slightly increased in diameter. Using the same measurement techniques it measures 3.2 cm in AP dimension. Heavy calcified atherosclerotic disease of the aorta. Are 3 iliac stent graft unchanged. Reproductive: Uterus and bilateral adnexa are unremarkable. Other: No abdominal wall hernia or abnormality. No abdominopelvic ascites. Musculoskeletal: Spondylosis of the spine. IMPRESSION: Marked left perinephric inflammatory stranding and moderate left hydronephrosis. High density material fills the collecting system of the left kidney, query hemorrhagic or empyematous. There is a 3 mm left distal ureteral calculus, just upstream to the vesicoureteral junction, however the upstream ureter is not markedly dilated. Evidence of chronic left diverticulitis without evidence of acute pericolonic inflammatory changes. Known infrarenal abdominal aortic aneurysm has slightly increased in diameter measuring 3.2 cm in maximum AP dimension. Heavy calcific atherosclerotic disease of the aorta. Electronically Signed   By: Fidela Salisbury M.D.   On: 09/11/2018 18:12  Assessment/Plan: POD 1 s/p left ureteroscopy and stent placement Acute on chronic renal failure Left Pyelonephritis  -Blood and urine cultures are still pending.  She is clinically improving with IV Rocephin.   -Continue IVF resuscitation.  Will leave Foley in place for another day to monitor urine output.  -Will plan for stent removal as an OP in 7-10 days.    LOS: 2 days   Ellison Hughs, MD [facility] Pager: [phone]  09/13/2018, 12:12 PM

## 2018-09-13 NOTE — Progress Notes (Signed)
PROGRESS NOTE                                                                                                                                                                                                             Patient Demographics:    Jessica Taylor, is a 77 y.o. female, DOB - [birth year], BSJ:[id number]  Admit date - 09/11/2018   Admitting Physician Courage Denton Brick, MD  Outpatient Primary MD for the patient is Antony Contras, MD  LOS - 2  Outpatient Specialists: None  Chief Complaint  Patient presents with  . Abdominal Pain       Brief Narrative 77 year old female admitted to [facility] on 10/20 with left flank pain, dysuria, vomiting and hematuria.  2 days prior to that she was seen in the ED with left flank pain and diagnosed with UTI, discharged home on oral antibiotic.  She returned with worsening symptoms.  CT done in the ED showed left perinephric inflammatory stranding and left hydronephrosis with 3 mm left distal ureteral calculus.  After discussing with urologist patient transferred to [facility].  patient underwent cystoscopy with left ureteroscopy and left JJ stent placement and left retrograde pyelogram.   Subjective:   Reports flank pain to be much better.  Denies any dysuria, nausea or vomiting.   Assessment  & Plan :    Principal Problem:   Lt Acute pyelonephritis  Afebrile and left flank pain improved after the procedure yesterday.  Urine culture pending (did not show any growth on 10/19 so I doubt may grow anything further).  On empiric Cipro which I will switch to Rocephin. Encourage patient to ambulate. Continue IV fluids. Further recommendations per urology (determine if Foley catheter can be discontinued today)  Active Problems: Left-sided hydronephrosis Secondary to 3 mm distal left ureteral stone.  Cystoscopy with double-J stent placed.  Stone was not visible during  procedure. Monitor renal function.  Further recommendations per urology.   Acute kidney injury on chronic kidney disease stage II Baseline creatinine around 1.3-1.4.  Worsened to >2 this admission.  Possibly due to acute pyelonephritis and left hydronephrosis. Avoid nephrotoxins.  Continue IV normal saline at 100 cc/h and monitor.  Acute blood loss anemia/iron deficiency anemia Check iron panel.  Transfusion not needed at present.  Monitor H&H.  Aspirin and Plavix on hold.  History  of coronary artery disease/PAD Stable.  Continue beta-blocker and statin.  With presentation for hematuria aspirin and Plavix have been held and will be resumed tomorrow if H&H is stable.    Essential hypertension Stable.  Continue home meds  Hypothyroidism Continue Synthroid        Code Status : Full code  Family Communication  : None at bedside  Disposition Plan  : Home possibly in 1-2 days if hematuria resolved and renal function improved  Barriers For Discharge : Active symptoms  Consults  : Urology  Procedures  :  #1 cystoscopy with left ureteroscopy and JJ stent placement, retrograde pyelogram on 10/21 2.  CT abdomen and pelvis  DVT Prophylaxis  : SCDs  Lab Results  Component Value Date   PLT 202 09/13/2018    Antibiotics  :   Anti-infectives (From admission, onward)   Start     Dose/Rate Route Frequency Ordered Stop   09/13/18 1115  cefTRIAXone (ROCEPHIN) 1 g in sodium chloride 0.9 % 100 mL IVPB     1 g 200 mL/hr over 30 Minutes Intravenous Every 24 hours 09/13/18 1105     09/12/18 1800  ciprofloxacin (CIPRO) IVPB 400 mg  Status:  Discontinued     400 mg 200 mL/hr over 60 Minutes Intravenous Every 24 hours 09/12/18 1453 09/13/18 1105   09/12/18 0400  piperacillin-tazobactam (ZOSYN) IVPB 3.375 g  Status:  Discontinued     3.375 g 12.5 mL/hr over 240 Minutes Intravenous Every 8 hours 09/11/18 2124 09/12/18 1453   09/11/18 2130  piperacillin-tazobactam (ZOSYN) IVPB 3.375 g       3.375 g 100 mL/hr over 30 Minutes Intravenous  Once 09/11/18 2124 09/11/18 2208   09/11/18 1900  cefTRIAXone (ROCEPHIN) 1 g in sodium chloride 0.9 % 100 mL IVPB     1 g 200 mL/hr over 30 Minutes Intravenous  Once 09/11/18 1854 09/11/18 2033        Objective:   Vitals:   09/12/18 1852 09/12/18 2111 09/12/18 2158 09/13/18 0457  BP: (!) 141/58  (!) 117/55 (!) 121/59  Pulse: 99 (!) 112 73 69  Resp: 16  16 (!) 1  Temp: 99.1 F (37.3 C)  99 F (37.2 C) 97.7 F (36.5 C)  TempSrc: Oral  Oral Oral  SpO2: 98%  96% 96%  Weight:      Height:        Wt Readings from Last 3 Encounters:  09/12/18 75 kg  09/10/18 73.9 kg  08/04/18 72.6 kg     Intake/Output Summary (Last 24 hours) at 09/13/2018 1105 Last data filed at 09/12/2018 2202 Gross per 24 hour  Intake 1026.1 ml  Output 245 ml  Net 781.1 ml     Physical Exam  Gen: not in distress HEENT:  moist mucosa, supple neck Chest: clear b/l, no added sounds CVS: N S1&S2, no murmurs, GI: soft, nondistended, bowel sounds present, mild left CVA tenderness, Foley draining dark pinkish urine Musculoskeletal: warm, no edema     Data Review:    CBC Recent Labs  Lab 09/10/18 1939 09/11/18 1918 09/12/18 0659 09/13/18 0511  WBC 6.9 14.8* 11.1* 10.0  HGB 9.4* 10.3* 8.5* 8.3*  HCT 34.8* 37.3 30.9* 30.6*  PLT 224 240 208 202  MCV 79.6* 80.4 81.1 81.4  MCH 21.5* 22.2* 22.3* 22.1*  MCHC 27.0* 27.6* 27.5* 27.1*  RDW 18.7* 19.4* 19.2* 19.8*  LYMPHSABS 1.8 0.6*  --   --   MONOABS 0.5 0.6  --   --  EOSABS 0.2 0.1  --   --   BASOSABS 0.1 0.1  --   --     Chemistries  Recent Labs  Lab 09/10/18 1939 09/11/18 1918 09/12/18 0659 09/13/18 0511  NA 139 139 140 142  K 4.2 4.1 4.7 4.5  CL 108 106 109 113*  CO2 25 25 25 23   GLUCOSE 96 149* 130* 129*  BUN 16 18 19  25*  CREATININE 1.31* 1.68* 2.07* 2.04*  CALCIUM 9.3 9.5 8.6* 8.8*  AST 19  --   --   --   ALT 15  --   --   --   ALKPHOS 69  --   --   --   BILITOT 0.5  --    --   --    ------------------------------------------------------------------------------------------------------------------ No results for input(s): CHOL, HDL, LDLCALC, TRIG, CHOLHDL, LDLDIRECT in the last 72 hours.  Lab Results  Component Value Date   HGBA1C 5.8 (H) 01/23/2016   ------------------------------------------------------------------------------------------------------------------ No results for input(s): TSH, T4TOTAL, T3FREE, THYROIDAB in the last 72 hours.  Invalid input(s): FREET3 ------------------------------------------------------------------------------------------------------------------ No results for input(s): VITAMINB12, FOLATE, FERRITIN, TIBC, IRON, RETICCTPCT in the last 72 hours.  Coagulation profile No results for input(s): INR, PROTIME in the last 168 hours.  No results for input(s): DDIMER in the last 72 hours.  Cardiac Enzymes No results for input(s): CKMB, TROPONINI, MYOGLOBIN in the last 168 hours.  Invalid input(s): CK ------------------------------------------------------------------------------------------------------------------    Component Value Date/Time   BNP 67.3 02/08/2015 2337    Inpatient Medications  Scheduled Meds: . atorvastatin  40 mg Oral q1800  . ferrous sulfate  325 mg Oral Q breakfast  . heparin  5,000 Units Subcutaneous Q8H  . levothyroxine  50 mcg Oral Q0600  . metoprolol tartrate  25 mg Oral BID  . pantoprazole  40 mg Oral BID  . senna-docusate  2 tablet Oral BID  . sodium chloride flush  3 mL Intravenous Q12H   Continuous Infusions: . sodium chloride    . sodium chloride 100 mL/hr at 09/13/18 0831  . cefTRIAXone (ROCEPHIN)  IV     PRN Meds:.sodium chloride, acetaminophen **OR** acetaminophen, albuterol, hydrALAZINE, morphine injection, nitroGLYCERIN, ondansetron **OR** ondansetron (ZOFRAN) IV, oxyCODONE, polyethylene glycol, sodium chloride flush, traZODone  Micro Results Recent Results (from the past 240  hour(s))  Urine culture     Status: None   Collection Time: 09/10/18  8:52 PM  Result Value Ref Range Status   Specimen Description   Final    URINE, CLEAN CATCH Performed at [facility], [address] [place], [place] [address]    Special Requests   Final    NONE Performed at [facility], [address], [place], [place] [address]    Culture   Final    NO GROWTH Performed at [facility], [place] [address], [place], [place] [address]    Report Status 09/12/2018 FINAL  Final  Culture, blood (Routine X 2) w Reflex to ID Panel     Status: None (Preliminary result)   Collection Time: 09/11/18 10:56 PM  Result Value Ref Range Status   Specimen Description LEFT ANTECUBITAL  Final   Special Requests   Final    BOTTLES DRAWN AEROBIC AND ANAEROBIC Blood Culture adequate volume Performed at [facility], [address], [place], [place] [address]    Culture PENDING  Incomplete   Report Status PENDING  Incomplete  MRSA PCR Screening     Status: None   Collection Time: 09/12/18  6:00  PM  Result Value Ref Range Status   MRSA by PCR NEGATIVE NEGATIVE Final    Comment:        The GeneXpert MRSA Assay (FDA approved for NASAL specimens only), is one component of a comprehensive MRSA colonization surveillance program. It is not intended to diagnose MRSA infection nor to guide or monitor treatment for MRSA infections. Performed at [facility], [place] [address], [place], [place] [address]     Radiology Reports Dg C-arm 1-60 Min-no Report  Result Date: 09/12/2018 Fluoroscopy was utilized by the requesting physician.  No radiographic interpretation.   Ct Renal Stone Study  Result Date: 09/11/2018 CLINICAL DATA:  Left upper abdominal pain radiating to the left flank. Nausea vomiting. EXAM: CT ABDOMEN AND PELVIS WITHOUT CONTRAST TECHNIQUE: Multidetector CT imaging of the abdomen and pelvis was performed following the standard protocol without IV contrast.  COMPARISON:  02/21/2016 FINDINGS: Lower chest: No acute abnormality. Calcific atherosclerotic disease of the coronary arteries. Post CABG. Small hiatal hernia. Hepatobiliary: 11 mm right hepatic cyst.  Suspected cholelithiasis. Pancreas: Atrophic appearance of the pancreas. Spleen: Normal in size without focal abnormality. Adrenals/Urinary Tract: Normal adrenal glands. Normal appearance of the right kidney, right ureter and urinary bladder. Marked left perinephric stranding with moderate left hydronephrosis. High density material is present within the collecting system of the left kidney. There is a 3 mm distal left ureteral calculus just upstream of the vesicoureteral junction. Minimal upstream dilation of the left ureter. Stomach/Bowel: Stomach is within normal limits. No evidence of appendicitis. No evidence of bowel wall thickening, distention, or inflammatory changes. Marked left colonic diverticulosis with chronic appearing thickening of the left colon without evidence of acute diverticulitis. Vascular/Lymphatic: Known infrarenal abdominal aortic aneurysm has slightly increased in diameter. Using the same measurement techniques it measures 3.2 cm in AP dimension. Heavy calcified atherosclerotic disease of the aorta. Are 3 iliac stent graft unchanged. Reproductive: Uterus and bilateral adnexa are unremarkable. Other: No abdominal wall hernia or abnormality. No abdominopelvic ascites. Musculoskeletal: Spondylosis of the spine. IMPRESSION: Marked left perinephric inflammatory stranding and moderate left hydronephrosis. High density material fills the collecting system of the left kidney, query hemorrhagic or empyematous. There is a 3 mm left distal ureteral calculus, just upstream to the vesicoureteral junction, however the upstream ureter is not markedly dilated. Evidence of chronic left diverticulitis without evidence of acute pericolonic inflammatory changes. Known infrarenal abdominal aortic aneurysm has  slightly increased in diameter measuring 3.2 cm in maximum AP dimension. Heavy calcific atherosclerotic disease of the aorta. Electronically Signed   By: Fidela Salisbury M.D.   On: 09/11/2018 18:12    Time Spent in minutes 35   Kasia Trego M.D on 09/13/2018 at 11:05 AM  Between 7am to 7pm - Pager - [phone]  After 7pm go to [email or web address] - password [email or web address]  [facility] -  Office  [phone]

## 2018-09-14 DIAGNOSIS — Z9861 Coronary angioplasty status: Secondary | ICD-10-CM

## 2018-09-14 DIAGNOSIS — D509 Iron deficiency anemia, unspecified: Secondary | ICD-10-CM

## 2018-09-14 DIAGNOSIS — I2510 Atherosclerotic heart disease of native coronary artery without angina pectoris: Secondary | ICD-10-CM

## 2018-09-14 DIAGNOSIS — N12 Tubulo-interstitial nephritis, not specified as acute or chronic: Secondary | ICD-10-CM

## 2018-09-14 DIAGNOSIS — I739 Peripheral vascular disease, unspecified: Secondary | ICD-10-CM

## 2018-09-14 LAB — BASIC METABOLIC PANEL
ANION GAP: 9 (ref 5–15)
BUN: 29 mg/dL — ABNORMAL HIGH (ref 8–23)
CALCIUM: 8.1 mg/dL — AB (ref 8.9–10.3)
CO2: 21 mmol/L — ABNORMAL LOW (ref 22–32)
Chloride: 112 mmol/L — ABNORMAL HIGH (ref 98–111)
Creatinine, Ser: 1.45 mg/dL — ABNORMAL HIGH (ref 0.44–1.00)
GFR calc Af Amer: 39 mL/min — ABNORMAL LOW (ref >60)
GFR, EST NON AFRICAN AMERICAN: 33 mL/min — AB (ref >60)
Glucose, Bld: 98 mg/dL (ref 70–99)
POTASSIUM: 4 mmol/L (ref 3.5–5.1)
SODIUM: 142 mmol/L (ref 135–145)

## 2018-09-14 LAB — CBC
HCT: 27.6 % — ABNORMAL LOW (ref 36.0–46.0)
HEMOGLOBIN: 7.4 g/dL — AB (ref 12.0–15.0)
MCH: 21.8 pg — ABNORMAL LOW (ref 26.0–34.0)
MCHC: 26.8 g/dL — AB (ref 30.0–36.0)
MCV: 81.4 fL (ref 80.0–100.0)
NRBC: 0 % (ref 0.0–0.2)
Platelets: 178 10*3/uL (ref 150–400)
RBC: 3.39 MIL/uL — AB (ref 3.87–5.11)
RDW: 19.7 % — ABNORMAL HIGH (ref 11.5–15.5)
WBC: 8.9 10*3/uL (ref 4.0–10.5)

## 2018-09-14 LAB — URINE CULTURE: Culture: NO GROWTH

## 2018-09-14 MED ORDER — SODIUM CHLORIDE 0.9 % IV SOLN
510.0000 mg | Freq: Once | INTRAVENOUS | Status: AC
  Administered 2018-09-14: 510 mg via INTRAVENOUS
  Filled 2018-09-14: qty 17

## 2018-09-14 MED ORDER — ASPIRIN EC 81 MG PO TBEC
81.0000 mg | DELAYED_RELEASE_TABLET | Freq: Every day | ORAL | Status: DC
  Administered 2018-09-14 – 2018-09-15 (×2): 81 mg via ORAL
  Filled 2018-09-14 (×2): qty 1

## 2018-09-14 NOTE — Care Management Important Message (Signed)
Important Message  Patient Details  Name: Jessica Taylor MRN: [id number] Date of Birth: [birth year]   Medicare Important Message Given:  Yes    Kerin Salen 09/14/2018, 11:13 AMImportant Message  Patient Details  Name: Jessica Taylor MRN: [id number] Date of Birth: 12-11-1940   Medicare Important Message Given:  Yes    Kerin Salen 09/14/2018, 11:13 AM

## 2018-09-14 NOTE — Progress Notes (Signed)
Renal function is improving.  She has remained AFVSS.  No complaints of pain.  Will d/c Foley.  She has f/u in my office on 09/22/18 for stent removal.  Please call with any questions.  Ellison Hughs, MD Urology

## 2018-09-14 NOTE — Progress Notes (Signed)
PROGRESS NOTE    Jessica Taylor  AGT:[id number] DOB: [birth year] DOA: 09/11/2018 PCP: Antony Contras, MD    Brief Narrative:  77 year old female who presented with abdominal pain, she does have significant past medical history for hypertension, dyslipidemia, coronary artery disease, hypothyroidism, and peripheral vascular disease.  Patient reported left-sided flank pain, associated with dysuria and hematuria.  On the initial physical examination blood pressure 177/90, heart rate 96, temperature 97.4, respiratory rate 18, oxygen saturation 100%.  Lungs are clear to auscultation bilaterally, heart S1-S2 present rhythmic, the abdomen was soft, left lower quadrant tenderness without rebound or guarding, no lower extremity edema.  CT scan showed marked left perinephric inflammation, stranding and moderate left hydronephrosis.  3 mm left distal ureteral calculus.   Patient was admitted to the hospital with a working diagnosis of acute pyelonephritis, complicated by urolithiasis and hydronephrosis.   Assessment & Plan:   Principal Problem:   Lt Acute pyelonephritis Active Problems:   Essential hypertension   CAD S/P prior PCI to LAD and RCA   Hypothyroidism   PAD (peripheral artery disease) (HCC)   Pyelonephritis   Acute blood loss anemia   Acute renal failure with acute tubular necrosis superimposed on stage 2 chronic kidney disease (HCC)   Iron deficiency anemia   1. Acute pyelonephritis sp left ureteroscopy and stent placement. Continue antibiotic therapy with IV ceftriaxone, cultures continue to be no growth, wbc today at 8,9 from peak of 14,8. Will decrease rate of IV fluids to 50 ml per hour. Patient has remained afebrile. Continue to follow on cell count and temperature curve. Out of bed to chair tid and physical therapy evaluation. Follow with urology recommendations.   2. Hypovolemic acute kidney injury on CKD stage 3. Will decrease saline at 50 ml, per hour, will continue to follow on  renal panel in am, today's cr at 1,45 with K at 4,0. Avoid hypotension and nephrotoxic medications.  3. Iron deficiency anemia on acute blood loss anemia (hematuria). Will continue to follow on H*H, iron panel with transfferin saturation 12, tibc 319 with iron 39. Will order one dose of IV iron, follow on iron panel as outpatient.   4. Coronary artery disease. Continue statin and beta blockade, will resume aspirin and clopidogrel.   5, HTN. Continue metoprolol for blood pressure control.   6. Hypothyroidism. Continue levothyroxine.   DVT prophylaxis: heparin   Code Status: full Family Communication: no family at the bedside  Disposition Plan/ discharge barriers: pending clinical improvement    Consultants:   Urology   Procedures:     Antimicrobials:   Ceftriaxone     Subjective: No nausea or vomiting, no chest pain or dyspnea. Out of bed.   Objective: Vitals:   09/13/18 0457 09/13/18 1532 09/13/18 2106 09/14/18 0446  BP: (!) 121/59 (!) 136/55 (!) 118/46 (!) 166/60  Pulse: 69 63 72 66  Resp: (!) 1 16 20 18   Temp: 97.7 F (36.5 C) 98.1 F (36.7 C) 98.6 F (37 C) 98.3 F (36.8 C)  TempSrc: Oral Oral Oral Oral  SpO2: 96% 100% 93% 97%  Weight:      Height:        Intake/Output Summary (Last 24 hours) at 09/14/2018 0850 Last data filed at 09/14/2018 0500 Gross per 24 hour  Intake 2676.35 ml  Output 550 ml  Net 2126.35 ml   Filed Weights   09/11/18 1633 09/11/18 2231 09/12/18 1535  Weight: 72.6 kg 73.9 kg 75 kg    Examination:  General: Not in pain or dyspnea, deconditioned  Neurology: Awake and alert, non focal  E ENT: mild pallor, no icterus, oral mucosa moist Cardiovascular: No JVD. S1-S2 present, rhythmic, no gallops, rubs, or murmurs. No lower extremity edema. Pulmonary: decreased breath sounds bilaterally, adequate air movement, no wheezing, rhonchi or rales. Gastrointestinal. Abdomen with mild distention with no organomegaly, non tender, no rebound  or guarding Skin. No rashes Musculoskeletal: no joint deformities     Data Reviewed: I have personally reviewed following labs and imaging studies  CBC: Recent Labs  Lab 09/10/18 1939 09/11/18 1918 09/12/18 0659 09/13/18 0511 09/14/18 0350  WBC 6.9 14.8* 11.1* 10.0 8.9  NEUTROABS 4.4 13.3*  --   --   --   HGB 9.4* 10.3* 8.5* 8.3* 7.4*  HCT 34.8* 37.3 30.9* 30.6* 27.6*  MCV 79.6* 80.4 81.1 81.4 81.4  PLT 224 240 208 202 950   Basic Metabolic Panel: Recent Labs  Lab 09/10/18 1939 09/11/18 1918 09/12/18 0659 09/13/18 0511 09/14/18 0350  NA 139 139 140 142 142  K 4.2 4.1 4.7 4.5 4.0  CL 108 106 109 113* 112*  CO2 25 25 25 23  21*  GLUCOSE 96 149* 130* 129* 98  BUN 16 18 19  25* 29*  CREATININE 1.31* 1.68* 2.07* 2.04* 1.45*  CALCIUM 9.3 9.5 8.6* 8.8* 8.1*   GFR: Estimated Creatinine Clearance: 30.5 mL/min (A) (by C-G formula based on SCr of 1.45 mg/dL (H)). Liver Function Tests: Recent Labs  Lab 09/10/18 1939  AST 19  ALT 15  ALKPHOS 69  BILITOT 0.5  PROT 7.0  ALBUMIN 3.9   No results for input(s): LIPASE, AMYLASE in the last 168 hours. No results for input(s): AMMONIA in the last 168 hours. Coagulation Profile: No results for input(s): INR, PROTIME in the last 168 hours. Cardiac Enzymes: No results for input(s): CKTOTAL, CKMB, CKMBINDEX, TROPONINI in the last 168 hours. BNP (last 3 results) No results for input(s): PROBNP in the last 8760 hours. HbA1C: No results for input(s): HGBA1C in the last 72 hours. CBG: No results for input(s): GLUCAP in the last 168 hours. Lipid Profile: No results for input(s): CHOL, HDL, LDLCALC, TRIG, CHOLHDL, LDLDIRECT in the last 72 hours. Thyroid Function Tests: No results for input(s): TSH, T4TOTAL, FREET4, T3FREE, THYROIDAB in the last 72 hours. Anemia Panel: Recent Labs    09/13/18 1205  TIBC 319  IRON 38      Radiology Studies: I have reviewed all of the imaging during this hospital visit  personally     Scheduled Meds: . atorvastatin  40 mg Oral q1800  . ferrous sulfate  325 mg Oral Q breakfast  . heparin  5,000 Units Subcutaneous Q8H  . levothyroxine  50 mcg Oral Q0600  . metoprolol tartrate  25 mg Oral BID  . pantoprazole  40 mg Oral BID  . senna-docusate  2 tablet Oral BID  . sodium chloride flush  3 mL Intravenous Q12H   Continuous Infusions: . sodium chloride    . sodium chloride 100 mL/hr at 09/14/18 0109  . cefTRIAXone (ROCEPHIN)  IV Stopped (09/13/18 1308)     LOS: 3 days        Jasha Hodzic Gerome Apley, MD [facility] Pager [phone]

## 2018-09-14 NOTE — Plan of Care (Signed)
  Problem: Clinical Measurements: Goal: Will remain free from infection Outcome: Progressing   Problem: Elimination: Goal: Will not experience complications related to bowel motility Outcome: Progressing Goal: Will not experience complications related to urinary retention Outcome: Progressing   Problem: Education: Goal: Knowledge of General Education information will improve Description Including pain rating scale, medication(s)/side effects and non-pharmacologic comfort measures Outcome: Adequate for Discharge   Problem: Health Behavior/Discharge Planning: Goal: Ability to manage health-related needs will improve Outcome: Adequate for Discharge   Problem: Clinical Measurements: Goal: Ability to maintain clinical measurements within normal limits will improve Outcome: Adequate for Discharge Goal: Diagnostic test results will improve Outcome: Adequate for Discharge Goal: Respiratory complications will improve Outcome: Adequate for Discharge Goal: Cardiovascular complication will be avoided Outcome: Adequate for Discharge   Problem: Activity: Goal: Risk for activity intolerance will decrease Outcome: Adequate for Discharge   Problem: Nutrition: Goal: Adequate nutrition will be maintained Outcome: Adequate for Discharge   Problem: Coping: Goal: Level of anxiety will decrease Outcome: Adequate for Discharge   Problem: Pain Managment: Goal: General experience of comfort will improve Outcome: Adequate for Discharge   Problem: Safety: Goal: Ability to remain free from injury will improve Outcome: Adequate for Discharge   Problem: Skin Integrity: Goal: Risk for impaired skin integrity will decrease Outcome: Adequate for Discharge

## 2018-09-15 LAB — BASIC METABOLIC PANEL
Anion gap: 8 (ref 5–15)
BUN: 22 mg/dL (ref 8–23)
CALCIUM: 9.1 mg/dL (ref 8.9–10.3)
CO2: 25 mmol/L (ref 22–32)
CREATININE: 1.5 mg/dL — AB (ref 0.44–1.00)
Chloride: 111 mmol/L (ref 98–111)
GFR, EST AFRICAN AMERICAN: 37 mL/min — AB (ref >60)
GFR, EST NON AFRICAN AMERICAN: 32 mL/min — AB (ref >60)
Glucose, Bld: 92 mg/dL (ref 70–99)
Potassium: 3.8 mmol/L (ref 3.5–5.1)
SODIUM: 144 mmol/L (ref 135–145)

## 2018-09-15 MED ORDER — AMOXICILLIN-POT CLAVULANATE 875-125 MG PO TABS
1.0000 | ORAL_TABLET | Freq: Two times a day (BID) | ORAL | Status: DC
  Administered 2018-09-15: 1 via ORAL
  Filled 2018-09-15: qty 1

## 2018-09-15 MED ORDER — CEPHALEXIN 500 MG PO CAPS: 500.0000 mg | ORAL_CAPSULE | Freq: Three times a day (TID) | ORAL | Status: DC

## 2018-09-15 MED ORDER — AMOXICILLIN-POT CLAVULANATE 875-125 MG PO TABS: 1.0000 | ORAL_TABLET | Freq: Two times a day (BID) | ORAL | 0 refills | Status: AC

## 2018-09-15 NOTE — Progress Notes (Signed)
Pt discharged from the unit via wheelchair. Discharge instructions were reviewed with the pt. No questions or concerns at this time.

## 2018-09-15 NOTE — Care Management Note (Signed)
Case Management Note  Patient Details  Name: Jessica Taylor MRN: [id number] Date of Birth: Apr 25, 1941  Subjective/Objective:                  discharged to home with self care  Action/Plan: Discharge to home with self care, orders checked for [facility]hc needs. No CM needs present at time of discharge.  Expected Discharge Date:  09/15/18               Expected Discharge Plan:  Home/Self Care  In-House Referral:     Discharge planning Services  CM Consult  Post Acute Care Choice:    Choice offered to:     DME Arranged:    DME Agency:     HH Arranged:    HH Agency:     Status of Service:  Completed, signed off  If discussed at [employer] of Stay Meetings, dates discussed:    Additional Comments:  Leeroy Cha, RN 09/15/2018, 11:26 AM

## 2018-09-15 NOTE — Discharge Summary (Signed)
Physician Discharge Summary  Jessica Taylor ZCH:[id number] DOB: 12-30-1940 DOA: 09/11/2018  PCP: Antony Contras, MD  Admit date: 09/11/2018 Discharge date: 09/15/2018  Admitted From: Home  Disposition:  Home   Recommendations for Outpatient Follow-up and new medication changes:  1. Follow up with Dr. Moreen Fowler in 7 days 2. Patient placed on Augmentin for the next 7 days 3. Follow with Dr. Lovena Neighbours (Urology) 09/22/18 for stent removal.   Home Health: no   Equipment/Devices: no    Discharge Condition: stable  CODE STATUS: full  Diet recommendation: heart healthy   Brief/Interim Summary: 77 year old female who presented with abdominal pain, she does have significant past medical history for hypertension, dyslipidemia, coronary artery disease, hypothyroidism, and peripheral vascular disease.  Patient reported left-sided flank pain, associated with dysuria and hematuria.   She was diagnosed with UTI as an outpatient, had cephalexin prescribed with no improvement of her symptoms. On the initial physical examination blood pressure 177/90, heart rate 96, temperature 97.4, respiratory rate 18, oxygen saturation 100%.  Lungs were clear to auscultation bilaterally, heart S1-S2 present rhythmic, the abdomen was soft, left lower quadrant tenderness without rebound or guarding, no lower extremity edema.  Sodium 139, potassium 4.1, chloride 106, bicarb 25, glucose 149, BUN 18, creatinine 1.68, white count 14.8, hemoglobin 10.3, hematocrit 37.3, platelets 240. urine analysis greater than 50 white cells, greater than 50 red cells, greater than 300 protein. CT scan showed marked left perinephric inflammation, stranding and moderate left hydronephrosis.  3 mm left distal ureteral calculus.   Patient was admitted to the hospital with a working diagnosis of acute pyelonephritis, complicated by urolithiasis, hydronephrosis and sepsis.   1.  Sepsis due to acute pyelonephritis, complicated by urolithiasis and  hydronephrosis.  Present on admission.  Patient was admitted to the medical ward, she was placed on a remote telemetry monitor, received intravenous fluids and broad-spectrum antibiotic therapy with Zosyn.  Blood and urine cultures remain with no growth.  Patient was evaluated by urology, underwent cystoscopy with left ureteroscopy and left JJ stent placement.  Left retrograde pyelogram with intraoperative interpretation of fluoroscopic imaging.  No evidence of left distal ureteral calculus.  Antibiotic therapy was narrowed to IV ceftriaxone with good toleration.  Patient will be discharged with 7 more days of Augmentin, follow-up on October 31 at the urology clinic for stent removal.  2.  Hypovolemic acute kidney injury on chronic kidney disease stage III.  Patient received isotonic IV fluids with improvement of kidney function, nephrotoxic agents were avoided, discharge creatinine 1.5, potassium 3.8, serum bicarbonate of 25.   3.  Iron deficiency anemia on acute blood loss anemia (hematuria).  Aspirin and clopidogrel were held during her hospitalization, hemoglobin and hematocrit dropped to 7.4 and 27.6.  No further hematuria,  iron stores revealed iron deficiency with serum iron 38, TIBC 319, transferrin saturation 12.  Patient received IV iron before discharge.  Follow-up cell count as an outpatient.  4.  Coronary artery disease.  Patient remained chest pain-free, at discharge will resume aspirin and clopidogrel. Continue atorvastatin.   5.  Hypertension.  Continue metoprolol for blood pressure control  6.  Hypothyroidism.  Continue levothyroxine.  Discharge Diagnoses:  Principal Problem:   Lt Acute pyelonephritis Active Problems:   Essential hypertension   CAD S/P prior PCI to LAD and RCA   Hypothyroidism   PAD (peripheral artery disease) (HCC)   Pyelonephritis   Acute blood loss anemia   Acute renal failure with acute tubular necrosis superimposed on stage 2 chronic  kidney disease ([place])    Iron deficiency anemia    Discharge Instructions   Allergies as of 09/15/2018      Reactions   Shrimp [shellfish Allergy] Nausea And Vomiting   Throws up violently      Medication List    STOP taking these medications   cephALEXin 500 MG capsule Commonly known as:  KEFLEX   ferrous sulfate 325 (65 FE) MG EC tablet     TAKE these medications   amoxicillin-clavulanate 875-125 MG tablet Commonly known as:  AUGMENTIN Take 1 tablet by mouth every 12 (twelve) hours for 7 days.   aspirin EC 81 MG tablet Take 81 mg by mouth daily.   atorvastatin 40 MG tablet Commonly known as:  LIPITOR TAKE (1) TABLET BY MOUTH ONCE DAILY.   clopidogrel 75 MG tablet Commonly known as:  PLAVIX TAKE (1) TABLET BY MOUTH ONCE DAILY.   HYDROcodone-acetaminophen 5-325 MG tablet Commonly known as:  NORCO/VICODIN Take 1 tablet by mouth 2 (two) times daily.   levothyroxine 50 MCG tablet Commonly known as:  SYNTHROID, LEVOTHROID Take 50 mcg by mouth daily before breakfast.   metoprolol tartrate 25 MG tablet Commonly known as:  LOPRESSOR TAKE 1 TABLET BY MOUTH TWICE DAILY.   nitroGLYCERIN 0.4 MG SL tablet Commonly known as:  NITROSTAT Place 1 tablet (0.4 mg total) under the tongue every 5 (five) minutes as needed for chest pain.   pantoprazole 40 MG tablet Commonly known as:  PROTONIX Take 40 mg by mouth 2 (two) times daily.       Allergies  Allergen Reactions  . Shrimp [Shellfish Allergy] Nausea And Vomiting    Throws up violently    Consultations:  Urology    Procedures/Studies: Dg C-arm 1-60 Min-no Report  Result Date: 09/12/2018 Fluoroscopy was utilized by the requesting physician.  No radiographic interpretation.   Ct Renal Stone Study  Result Date: 09/11/2018 CLINICAL DATA:  Left upper abdominal pain radiating to the left flank. Nausea vomiting. EXAM: CT ABDOMEN AND PELVIS WITHOUT CONTRAST TECHNIQUE: Multidetector CT imaging of the abdomen and pelvis was performed  following the standard protocol without IV contrast. COMPARISON:  02/21/2016 FINDINGS: Lower chest: No acute abnormality. Calcific atherosclerotic disease of the coronary arteries. Post CABG. Small hiatal hernia. Hepatobiliary: 11 mm right hepatic cyst.  Suspected cholelithiasis. Pancreas: Atrophic appearance of the pancreas. Spleen: Normal in size without focal abnormality. Adrenals/Urinary Tract: Normal adrenal glands. Normal appearance of the right kidney, right ureter and urinary bladder. Marked left perinephric stranding with moderate left hydronephrosis. High density material is present within the collecting system of the left kidney. There is a 3 mm distal left ureteral calculus just upstream of the vesicoureteral junction. Minimal upstream dilation of the left ureter. Stomach/Bowel: Stomach is within normal limits. No evidence of appendicitis. No evidence of bowel wall thickening, distention, or inflammatory changes. Marked left colonic diverticulosis with chronic appearing thickening of the left colon without evidence of acute diverticulitis. Vascular/Lymphatic: Known infrarenal abdominal aortic aneurysm has slightly increased in diameter. Using the same measurement techniques it measures 3.2 cm in AP dimension. Heavy calcified atherosclerotic disease of the aorta. Are 3 iliac stent graft unchanged. Reproductive: Uterus and bilateral adnexa are unremarkable. Other: No abdominal wall hernia or abnormality. No abdominopelvic ascites. Musculoskeletal: Spondylosis of the spine. IMPRESSION: Marked left perinephric inflammatory stranding and moderate left hydronephrosis. High density material fills the collecting system of the left kidney, query hemorrhagic or empyematous. There is a 3 mm left distal ureteral calculus, just upstream to  the vesicoureteral junction, however the upstream ureter is not markedly dilated. Evidence of chronic left diverticulitis without evidence of acute pericolonic inflammatory  changes. Known infrarenal abdominal aortic aneurysm has slightly increased in diameter measuring 3.2 cm in maximum AP dimension. Heavy calcific atherosclerotic disease of the aorta. Electronically Signed   By: Fidela Salisbury M.D.   On: 09/11/2018 18:12       Subjective: Patient is feeling better, no nausea or vomiting, no chest pain, abdominal pain or dyspnea.   Discharge Exam: Vitals:   09/15/18 0429 09/15/18 0445  BP: (!) 203/71 (!) 195/61  Pulse: 72 66  Resp: 18   Temp: 98.2 F (36.8 C)   SpO2: 95%    Vitals:   09/14/18 1417 09/14/18 2118 09/15/18 0429 09/15/18 0445  BP: (!) 153/65 (!) 164/69 (!) 203/71 (!) 195/61  Pulse: 67 89 72 66  Resp: 16 18 18    Temp: 97.7 F (36.5 C) 98.6 F (37 C) 98.2 F (36.8 C)   TempSrc: Oral Oral Oral   SpO2: 98% 93% 95%   Weight:      Height:        General: Not in pain or dyspnea Neurology: Awake and alert, non focal  E ENT: no pallor, no icterus, oral mucosa moist Cardiovascular: No JVD. S1-S2 present, rhythmic, no gallops, rubs, or murmurs. No lower extremity edema. Pulmonary: vesicular breath sounds bilaterally, adequate air movement, no wheezing, rhonchi or rales. Gastrointestinal. Abdomen with no organomegaly, non tender, no rebound or guarding Skin. No rashes Musculoskeletal: no joint deformities   The results of significant diagnostics from this hospitalization (including imaging, microbiology, ancillary and laboratory) are listed below for reference.     Microbiology: Recent Results (from the past 240 hour(s))  Urine culture     Status: None   Collection Time: 09/10/18  8:52 PM  Result Value Ref Range Status   Specimen Description   Final    URINE, CLEAN CATCH Performed at [facility], [address] [place], [place] [address]    Special Requests   Final    NONE Performed at [facility], [address], [place], [place] [address]    Culture   Final    NO GROWTH Performed at [facility], [place]  [address], [place], [place] [address]    Report Status 09/12/2018 FINAL  Final  Culture, blood (Routine X 2) w Reflex to ID Panel     Status: None (Preliminary result)   Collection Time: 09/11/18 10:56 PM  Result Value Ref Range Status   Specimen Description LEFT ANTECUBITAL  Final   Special Requests   Final    BOTTLES DRAWN AEROBIC AND ANAEROBIC Blood Culture adequate volume Performed at [facility], [name] [place], [place] [address]    Culture PENDING  Incomplete   Report Status PENDING  Incomplete  Urine Culture     Status: None   Collection Time: 09/12/18  5:25 PM  Result Value Ref Range Status   Specimen Description   Final    CYSTOSCOPY URINE Performed at [facility], [place] [address], [place], [place] [address]    Special Requests   Final    NONE Performed at [facility], [place] [address] [place], [place] [address]    Culture   Final    NO GROWTH Performed at [facility], [place] [address], [place],  [address]    Report Status 09/14/2018 FINAL  Final  MRSA PCR Screening  Status: None   Collection Time: 09/12/18  6:00 PM  Result Value Ref Range Status   MRSA by PCR NEGATIVE NEGATIVE Final    Comment:        The GeneXpert MRSA Assay (FDA approved for NASAL specimens only), is one component of a comprehensive MRSA colonization surveillance program. It is not intended to diagnose MRSA infection nor to guide or monitor treatment for MRSA infections. Performed at [facility], [place] [address] [place], [place] [address]      Labs: BNP (last 3 results) No results for input(s): BNP in the last 8760 hours. Basic Metabolic Panel: Recent Labs  Lab 09/11/18 1918 09/12/18 0659 09/13/18 0511 09/14/18 0350 09/15/18 0345  NA 139 140 142 142 144  K 4.1 4.7 4.5 4.0 3.8  CL 106 109 113* 112* 111  CO2 25 25 23  21* 25  GLUCOSE 149* 130* 129* 98 92  BUN 18 19 25* 29* 22  CREATININE 1.68* 2.07* 2.04*  1.45* 1.50*  CALCIUM 9.5 8.6* 8.8* 8.1* 9.1   Liver Function Tests: Recent Labs  Lab 09/10/18 1939  AST 19  ALT 15  ALKPHOS 69  BILITOT 0.5  PROT 7.0  ALBUMIN 3.9   No results for input(s): LIPASE, AMYLASE in the last 168 hours. No results for input(s): AMMONIA in the last 168 hours. CBC: Recent Labs  Lab 09/10/18 1939 09/11/18 1918 09/12/18 0659 09/13/18 0511 09/14/18 0350  WBC 6.9 14.8* 11.1* 10.0 8.9  NEUTROABS 4.4 13.3*  --   --   --   HGB 9.4* 10.3* 8.5* 8.3* 7.4*  HCT 34.8* 37.3 30.9* 30.6* 27.6*  MCV 79.6* 80.4 81.1 81.4 81.4  PLT 224 240 208 202 178   Cardiac Enzymes: No results for input(s): CKTOTAL, CKMB, CKMBINDEX, TROPONINI in the last 168 hours. BNP: Invalid input(s): POCBNP CBG: No results for input(s): GLUCAP in the last 168 hours. D-Dimer No results for input(s): DDIMER in the last 72 hours. Hgb A1c No results for input(s): HGBA1C in the last 72 hours. Lipid Profile No results for input(s): CHOL, HDL, LDLCALC, TRIG, CHOLHDL, LDLDIRECT in the last 72 hours. Thyroid function studies No results for input(s): TSH, T4TOTAL, T3FREE, THYROIDAB in the last 72 hours.  Invalid input(s): FREET3 Anemia work up Recent Labs    09/13/18 1205  TIBC 319  IRON 38   Urinalysis    Component Value Date/Time   COLORURINE YELLOW 09/11/2018 1816   APPEARANCEUR HAZY (A) 09/11/2018 1816   LABSPEC 1.019 09/11/2018 1816   PHURINE 5.0 09/11/2018 1816   GLUCOSEU NEGATIVE 09/11/2018 1816   HGBUR LARGE (A) 09/11/2018 1816   BILIRUBINUR NEGATIVE 09/11/2018 1816   KETONESUR NEGATIVE 09/11/2018 1816   PROTEINUR 30 (A) 09/11/2018 1816   UROBILINOGEN 0.2 12/28/2014 1531   NITRITE NEGATIVE 09/11/2018 1816   LEUKOCYTESUR NEGATIVE 09/11/2018 1816   Sepsis Labs Invalid input(s): PROCALCITONIN,  WBC,  LACTICIDVEN Microbiology Recent Results (from the past 240 hour(s))  Urine culture     Status: None   Collection Time: 09/10/18  8:52 PM  Result Value Ref Range Status    Specimen Description   Final    URINE, CLEAN CATCH Performed at [facility], [address] [place], [place] [address]    Special Requests   Final    NONE Performed at [facility], [address], [place], [place] [address]    Culture   Final    NO GROWTH Performed at [facility], [place] [address], [place], [place]  [address]    Report Status 09/12/2018 FINAL  Final  Culture, blood (Routine X 2) w Reflex to ID Panel     Status: None (Preliminary result)   Collection Time: 09/11/18 10:56 PM  Result Value Ref Range Status   Specimen Description LEFT ANTECUBITAL  Final   Special Requests   Final    BOTTLES DRAWN AEROBIC AND ANAEROBIC Blood Culture adequate volume Performed at [facility], [address], [place], [place] [address]    Culture PENDING  Incomplete   Report Status PENDING  Incomplete  Urine Culture     Status: None   Collection Time: 09/12/18  5:25 PM  Result Value Ref Range Status   Specimen Description   Final    CYSTOSCOPY URINE Performed at [facility], [place] [address], [place], [place] [address]    Special Requests   Final    NONE Performed at [facility], [place] [address], [place], [place] [address]    Culture   Final    NO GROWTH Performed at [facility], [place] [address] [place],  [address]    Report Status 09/14/2018 FINAL  Final  MRSA PCR Screening     Status: None   Collection Time: 09/12/18  6:00 PM  Result Value Ref Range Status   MRSA by PCR NEGATIVE NEGATIVE Final    Comment:        The GeneXpert MRSA Assay (FDA approved for NASAL specimens only), is one component of a comprehensive MRSA colonization surveillance program. It is not intended to diagnose MRSA infection nor to guide or monitor treatment for MRSA infections. Performed at [facility], [place] [address] [place],  [address]      Time coordinating discharge: 45 minutes  SIGNED:   Tawni Millers, MD  [facility] 09/15/2018, 8:45 AM Pager [phone]  If 7PM-7AM, please contact night-coverage [email or web address] Password [email or web address]

## 2018-09-17 LAB — CULTURE, BLOOD (ROUTINE X 2)
CULTURE: NO GROWTH
SPECIAL REQUESTS: ADEQUATE

## 2018-10-19 ENCOUNTER — Emergency Department (HOSPITAL_COMMUNITY)
Admission: EM | Admit: 2018-10-19 | Discharge: 2018-10-19 | Disposition: A | Discharge status: Home / Self Care | Payer: Medicare Other | Attending: Emergency Medicine | Admitting: Emergency Medicine

## 2018-10-19 ENCOUNTER — Other Ambulatory Visit: Payer: Self-pay

## 2018-10-19 ENCOUNTER — Emergency Department (HOSPITAL_COMMUNITY): Payer: Medicare Other

## 2018-10-19 ENCOUNTER — Encounter (HOSPITAL_COMMUNITY): Payer: Self-pay

## 2018-10-19 DIAGNOSIS — I2510 Atherosclerotic heart disease of native coronary artery without angina pectoris: Secondary | ICD-10-CM | POA: Diagnosis not present

## 2018-10-19 DIAGNOSIS — I10 Essential (primary) hypertension: Secondary | ICD-10-CM | POA: Insufficient documentation

## 2018-10-19 DIAGNOSIS — R1031 Right lower quadrant pain: Secondary | ICD-10-CM | POA: Diagnosis present

## 2018-10-19 DIAGNOSIS — N200 Calculus of kidney: Secondary | ICD-10-CM | POA: Diagnosis not present

## 2018-10-19 DIAGNOSIS — E039 Hypothyroidism, unspecified: Secondary | ICD-10-CM | POA: Diagnosis not present

## 2018-10-19 DIAGNOSIS — Z96651 Presence of right artificial knee joint: Secondary | ICD-10-CM | POA: Insufficient documentation

## 2018-10-19 DIAGNOSIS — Z87891 Personal history of nicotine dependence: Secondary | ICD-10-CM | POA: Insufficient documentation

## 2018-10-19 DIAGNOSIS — Z951 Presence of aortocoronary bypass graft: Secondary | ICD-10-CM | POA: Diagnosis not present

## 2018-10-19 DIAGNOSIS — R109 Unspecified abdominal pain: Secondary | ICD-10-CM

## 2018-10-19 LAB — COMPREHENSIVE METABOLIC PANEL
ALT: 16 U/L (ref 0–44)
ANION GAP: 8 (ref 5–15)
AST: 18 U/L (ref 15–41)
Albumin: 4.3 g/dL (ref 3.5–5.0)
Alkaline Phosphatase: 83 U/L (ref 38–126)
BILIRUBIN TOTAL: 0.6 mg/dL (ref 0.3–1.2)
BUN: 22 mg/dL (ref 8–23)
CALCIUM: 9.5 mg/dL (ref 8.9–10.3)
CO2: 24 mmol/L (ref 22–32)
Chloride: 106 mmol/L (ref 98–111)
Creatinine, Ser: 1.76 mg/dL — ABNORMAL HIGH (ref 0.44–1.00)
GFR calc Af Amer: 31 mL/min — ABNORMAL LOW (ref >60)
GFR, EST NON AFRICAN AMERICAN: 27 mL/min — AB (ref >60)
Glucose, Bld: 141 mg/dL — ABNORMAL HIGH (ref 70–99)
POTASSIUM: 4 mmol/L (ref 3.5–5.1)
Sodium: 138 mmol/L (ref 135–145)
TOTAL PROTEIN: 7.5 g/dL (ref 6.5–8.1)

## 2018-10-19 LAB — CBC
HEMATOCRIT: 41 % (ref 36.0–46.0)
Hemoglobin: 11.8 g/dL — ABNORMAL LOW (ref 12.0–15.0)
MCH: 24.5 pg — ABNORMAL LOW (ref 26.0–34.0)
MCHC: 28.8 g/dL — AB (ref 30.0–36.0)
MCV: 85.2 fL (ref 80.0–100.0)
NRBC: 0 % (ref 0.0–0.2)
PLATELETS: 231 10*3/uL (ref 150–400)
RBC: 4.81 MIL/uL (ref 3.87–5.11)
RDW: 20.7 % — AB (ref 11.5–15.5)
WBC: 12.6 10*3/uL — AB (ref 4.0–10.5)

## 2018-10-19 LAB — URINALYSIS, ROUTINE W REFLEX MICROSCOPIC
BILIRUBIN URINE: NEGATIVE
Bacteria, UA: NONE SEEN
Glucose, UA: NEGATIVE mg/dL
KETONES UR: NEGATIVE mg/dL
Leukocytes, UA: NEGATIVE
NITRITE: NEGATIVE
PH: 6 (ref 5.0–8.0)
Protein, ur: NEGATIVE mg/dL
SPECIFIC GRAVITY, URINE: 1.013 (ref 1.005–1.030)

## 2018-10-19 LAB — I-STAT CG4 LACTIC ACID, ED: LACTIC ACID, VENOUS: 1.39 mmol/L (ref 0.5–1.9)

## 2018-10-19 LAB — I-STAT TROPONIN, ED: TROPONIN I, POC: 0.02 ng/mL (ref 0.00–0.08)

## 2018-10-19 LAB — LIPASE, BLOOD: LIPASE: 29 U/L (ref 11–51)

## 2018-10-19 MED ORDER — HYDROMORPHONE HCL 1 MG/ML IJ SOLN
0.5000 mg | Freq: Once | INTRAMUSCULAR | Status: AC | PRN
  Administered 2018-10-19: 0.5 mg via INTRAVENOUS
  Filled 2018-10-19: qty 1

## 2018-10-19 MED ORDER — FENTANYL CITRATE (PF) 100 MCG/2ML IJ SOLN
25.0000 ug | Freq: Once | INTRAMUSCULAR | Status: AC
  Administered 2018-10-19: 25 ug via INTRAVENOUS
  Filled 2018-10-19: qty 2

## 2018-10-19 MED ORDER — HYDROMORPHONE HCL 1 MG/ML IJ SOLN
0.5000 mg | Freq: Once | INTRAMUSCULAR | Status: AC
  Administered 2018-10-19: 0.5 mg via INTRAVENOUS
  Filled 2018-10-19: qty 1

## 2018-10-19 MED ORDER — ONDANSETRON HCL 4 MG/2ML IJ SOLN
4.0000 mg | Freq: Once | INTRAMUSCULAR | Status: AC
  Administered 2018-10-19: 4 mg via INTRAVENOUS
  Filled 2018-10-19: qty 2

## 2018-10-19 MED ORDER — SODIUM CHLORIDE 0.9 % IV BOLUS
500.0000 mL | Freq: Once | INTRAVENOUS | Status: AC
  Administered 2018-10-19: 500 mL via INTRAVENOUS

## 2018-10-19 NOTE — Discharge Instructions (Addendum)
You were evaluated in the Emergency Department and after careful evaluation, we did not find any emergent condition requiring admission or further testing in the hospital.  Your symptoms today seem to be due to a kidney stone, which seems to have passed.  Please follow-up with your urologist and inform him or her of your new symptoms.  Please return to the Emergency Department if you experience any worsening of your condition.  We encourage you to follow up with a primary care provider.  Thank you for allowing [place] to be a part of your care.

## 2018-10-19 NOTE — ED Provider Notes (Signed)
[facility] Emergency Department Provider Note MRN:  [id number]  Arrival date & time: 10/19/18     Chief Complaint   Abdominal Pain   History of Present Illness   Jessica Taylor is a 77 y.o. year-old female with a history of AAA, hypertension, PAD, kidney stone presenting to the ED with chief complaint of flank pain.  Pain is located in the right flank.  At times the pain radiates to the right lower quadrant.  Began yesterday gradually, was mild at first.  More severe today.  Described as a dull ache.  Associated with nausea, no vomiting.  No headaches or vision change, no chest pain or shortness of breath.  No hematuria, no dysuria, no constipation, no diarrhea.  Patient was in the hospital last month for pyelonephritis and possible kidney stone requiring ureteral stent placement.  Stent removed a month ago.  Pain feels similar to prior kidney stone, which was on the left side last month.  Review of Systems  A complete 10 system review of systems was obtained and all systems are negative except as noted in the HPI and PMH.   Patient's Health History    Past Medical History:  Diagnosis Date  . Abdominal aortic aneurysm ([place])   . Acid reflux   . Anemia   . Arthritis    knees , R shoulder - tx /w injection - 11/2014  . Basal cell carcinoma (BCC) of dorsum of nose 2010   Resolved  . Cancer ([place])    basal cell on nose  . Coronary artery disease   . Hypercholesteremia   . Hypertension   . Hypothyroidism   . 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 Peripheral arterial disease (HCC)    hhigh-grade ostial bilateral calcified iliac stenosis with claudication  . Tobacco abuse   . Vertigo    when lays on left side.    Past Surgical History:  Procedure Laterality Date  . CARDIAC CATHETERIZATION  5 stents  . CARDIAC CATHETERIZATION N/A 01/20/2016   Procedure: Left Heart Cath and Coronary Angiography;  Surgeon: Peter M Martinique, MD;  Location: [place] [facility];  Service: Cardiovascular;   Laterality: N/A;  . CATARACT EXTRACTION W/PHACO Left 05/24/2014   Procedure: CATARACT EXTRACTION PHACO AND INTRAOCULAR LENS PLACEMENT (IOC);  Surgeon: Tonny Branch, MD;  Location: [facility];  Service: Ophthalmology;  Laterality: Left;  CDE:  9.30  . CATARACT EXTRACTION W/PHACO Right 06/18/2014   Procedure: CATARACT EXTRACTION PHACO AND INTRAOCULAR LENS PLACEMENT RIGHT EYE CDE=10.84;  Surgeon: Tonny Branch, MD;  Location: [facility];  Service: Ophthalmology;  Laterality: Right;  . CORONARY ARTERY BYPASS GRAFT N/A 01/24/2016   Procedure: CORONARY ARTERY BYPASS GRAFTING (CABG);  Surgeon: Gaye Pollack, MD;  Location: [place];  Service: Open Heart Surgery;  Laterality: N/A;  . CORONARY STENT PLACEMENT    . CORONARY STENT PLACEMENT  03/06/15   CFX DES  . CYSTOSCOPY/URETEROSCOPY/HOLMIUM LASER/STENT PLACEMENT Left 09/12/2018   Procedure: CYSTOSCOPY/URETEROSCOPY/STENT PLACEMENT;  Surgeon: Ceasar Mons, MD;  Location: [facility];  Service: Urology;  Laterality: Left;  . EYE SURGERY    . LEFT HEART CATHETERIZATION WITH CORONARY ANGIOGRAM N/A 03/06/2015   Procedure: LEFT HEART CATHETERIZATION WITH CORONARY ANGIOGRAM;  Surgeon: Lorretta Harp, MD;  Location: [facility] C[facility];  Service: Cardiovascular;  Laterality: N/A;  . PARTIAL KNEE ARTHROPLASTY Right 02/04/2015   Procedure: UNICOMPARTMENTAL KNEE;  Surgeon: Dorna Leitz, MD;  Location: [place];  Service: Orthopedics;  Laterality: Right;  . PERIPHERAL VASCULAR CATHETERIZATION Bilateral  05/13/2015   Procedure: Lower Extremity Angiography;  Surgeon: Lorretta Harp, MD;  Location: [place] [facility];  Service: Cardiovascular;  Laterality: Bilateral;  . PERIPHERAL VASCULAR CATHETERIZATION N/A 05/13/2015   Procedure: Abdominal Aortogram;  Surgeon: Lorretta Harp, MD;  Location: [place] [facility];  Service: Cardiovascular;  Laterality: N/A;  . PERIPHERAL VASCULAR CATHETERIZATION Bilateral 06/27/2015   Procedure: Peripheral Vascular Intervention;  Surgeon: Lorretta Harp, MD;   Location: [place] [facility];  Service: Cardiovascular;  Laterality: Bilateral;  ILIACS  . PERIPHERAL VASCULAR CATHETERIZATION Bilateral 06/27/2015   Procedure: Peripheral Vascular Atherectomy;  Surgeon: Lorretta Harp, MD;  Location: [place] [facility];  Service: Cardiovascular;  Laterality: Bilateral;  . TEE WITHOUT CARDIOVERSION N/A 01/24/2016   Procedure: TRANSESOPHAGEAL ECHOCARDIOGRAM (TEE);  Surgeon: Gaye Pollack, MD;  Location: [place];  Service: Open Heart Surgery;  Laterality: N/A;  . TONSILLECTOMY     age 89  . TUBAL LIGATION      Family History  Problem Relation Age of Onset  . Cancer Mother 45  . Cancer Father 74  . Hypertension Maternal Grandmother   . Stroke Maternal Grandfather   . Hypertension Son   . Heart attack Neg Hx     Social History   Socioeconomic History  . Marital status: Married    Spouse name: Not on file  . Number of children: Not on file  . Years of education: Not on file  . Highest education level: Not on file  Occupational History  . Not on file  Social Needs  . Financial resource strain: Not hard at all  . Food insecurity:    Worry: Never true    Inability: Never true  . Transportation needs:    Medical: No    Non-medical: No  Tobacco Use  . Smoking status: Former Smoker    Packs/day: 1.50    Years: 42.00    Pack years: 63.00    Types: Cigarettes    Last attempt to quit: 05/19/1999    Years since quitting: 19.4  . Smokeless tobacco: Never Used  Substance and Sexual Activity  . Alcohol use: No  . Drug use: No  . Sexual activity: Yes    Birth control/protection: Surgical  Lifestyle  . Physical activity:    Days per week: Patient refused    Minutes per session: Patient refused  . Stress: Only a little  Relationships  . Social connections:    Talks on phone: More than three times a week    Gets together: More than three times a week    Attends religious service: More than 4 times per year    Active member of club or organization:  No    Attends meetings of clubs or organizations: Never    Relationship status: Married  . Intimate partner violence:    Fear of current or ex partner: No    Emotionally abused: No    Physically abused: No    Forced sexual activity: No  Other Topics Concern  . Not on file  Social History Narrative  . Not on file     Physical Exam  Vital Signs and Nursing Notes reviewed Vitals:   10/19/18 1835 10/19/18 2030  BP: (!) 183/75 (!) 176/90  Pulse: 81   Resp: 18 (!) 21  Temp: 97.6 F (36.4 C)   SpO2: 100%     CONSTITUTIONAL: Well-appearing, NAD NEURO:  Alert and oriented x 3, no focal deficits EYES:  eyes equal and reactive  ENT/NECK:  no LAD, no JVD CARDIO: Regular rate, well-perfused, normal S1 and S2 PULM:  CTAB no wheezing or rhonchi GI/GU:  normal bowel sounds, non-distended, non-tender; mild right CVA tenderness MSK/SPINE:  No gross deformities, no edema SKIN:  no rash, atraumatic PSYCH:  Appropriate speech and behavior  Diagnostic and Interventional Summary    EKG Interpretation  Date/Time:  Wednesday October 19 2018 19:05:28 EST Ventricular Rate:  81 PR Interval:    QRS Duration: 145 QT Interval:  447 QTC Calculation: 519 R Axis:   -23 Text Interpretation:  Sinus rhythm Prolonged PR interval Left bundle branch block Confirmed by Gerlene Fee [id number]) on 10/19/2018 7:26:12 PM      Labs Reviewed  URINALYSIS, ROUTINE W REFLEX MICROSCOPIC - Abnormal; Notable for the following components:      Result Value   Hgb urine dipstick MODERATE (*)    All other components within normal limits  CBC - Abnormal; Notable for the following components:   WBC 12.6 (*)    Hemoglobin 11.8 (*)    MCH 24.5 (*)    MCHC 28.8 (*)    RDW 20.7 (*)    All other components within normal limits  COMPREHENSIVE METABOLIC PANEL - Abnormal; Notable for the following components:   Glucose, Bld 141 (*)    Creatinine, Ser 1.76 (*)    GFR calc non Af Amer 27 (*)    GFR calc Af Amer 31 (*)      All other components within normal limits  LIPASE, BLOOD  I-STAT TROPONIN, ED  I-STAT CG4 LACTIC ACID, ED    CT Renal Stone Study  Final Result      Medications  sodium chloride 0.9 % bolus 500 mL (500 mLs Intravenous New Bag/Given 10/19/18 1911)  ondansetron (ZOFRAN) injection 4 mg (4 mg Intravenous Given 10/19/18 1912)  fentaNYL (SUBLIMAZE) injection 25 mcg (25 mcg Intravenous Given 10/19/18 1912)  HYDROmorphone (DILAUDID) injection 0.5 mg (0.5 mg Intravenous Given 10/19/18 1935)  sodium chloride 0.9 % bolus 500 mL (500 mLs Intravenous New Bag/Given 10/19/18 2057)  HYDROmorphone (DILAUDID) injection 0.5 mg (0.5 mg Intravenous Given 10/19/18 2057)     Procedures Critical Care  ED Course and Medical Decision Making  I have reviewed the triage vital signs and the nursing notes.  Pertinent labs & imaging results that were available during my care of the patient were reviewed by me and considered in my medical decision making (see below for details).  Considering kidney stone versus less likely symptomatic AAA in this 77 year old female with flank pain.  Right-sided diverticulitis also possible.  Labs, CT, reassess.  CT reveals mild right hydronephrosis, perinephric stranding, stone in the bladder, all consistent with recently passed kidney stone.  This is correlating clinically, as the patient is feeling much better, now pain-free.  Aorta is unchanged on this CT done today.  Labs largely at baseline, no UTI.  Patient will follow-up with urology.  Encouraged fluids at home.  After the discussed management above, the patient was determined to be safe for discharge.  The patient was in agreement with this plan and all questions regarding their care were answered.  ED return precautions were discussed and the patient will return to the ED with any significant worsening of condition.  Barth Kirks. Sedonia Small, [place] [email or web address]  Final Clinical Impressions(s) / ED Diagnoses     ICD-10-CM   1. Abdominal pain, unspecified abdominal location R10.9   2. Kidney stone N20.0  ED Discharge Orders    None         Maudie Flakes, MD 10/19/18 2218

## 2018-10-19 NOTE — ED Triage Notes (Signed)
Pt reports that she may have a kidney stone. Pain started in right upper abdomen yesterday. Pt reports passing stone last month. Pt had UTI and stent placed in kidney last month

## 2018-10-19 NOTE — ED Notes (Signed)
Pt aware of need for urine specimen. Unable to go at this time.

## 2018-11-22 ENCOUNTER — Encounter (HOSPITAL_COMMUNITY): Payer: Self-pay | Admitting: *Deleted

## 2018-11-22 ENCOUNTER — Emergency Department (HOSPITAL_COMMUNITY)
Admission: EM | Admit: 2018-11-22 | Discharge: 2018-11-22 | Disposition: A | Discharge status: Home / Self Care | Payer: Medicare Other | Attending: Emergency Medicine | Admitting: Emergency Medicine

## 2018-11-22 DIAGNOSIS — Z79899 Other long term (current) drug therapy: Secondary | ICD-10-CM | POA: Diagnosis not present

## 2018-11-22 DIAGNOSIS — Z87891 Personal history of nicotine dependence: Secondary | ICD-10-CM | POA: Diagnosis not present

## 2018-11-22 DIAGNOSIS — R319 Hematuria, unspecified: Secondary | ICD-10-CM | POA: Insufficient documentation

## 2018-11-22 DIAGNOSIS — E039 Hypothyroidism, unspecified: Secondary | ICD-10-CM | POA: Insufficient documentation

## 2018-11-22 DIAGNOSIS — Z87442 Personal history of urinary calculi: Secondary | ICD-10-CM | POA: Insufficient documentation

## 2018-11-22 DIAGNOSIS — Z7982 Long term (current) use of aspirin: Secondary | ICD-10-CM | POA: Insufficient documentation

## 2018-11-22 DIAGNOSIS — Z85828 Personal history of other malignant neoplasm of skin: Secondary | ICD-10-CM | POA: Insufficient documentation

## 2018-11-22 DIAGNOSIS — L539 Erythematous condition, unspecified: Secondary | ICD-10-CM | POA: Diagnosis present

## 2018-11-22 DIAGNOSIS — L02414 Cutaneous abscess of left upper limb: Secondary | ICD-10-CM | POA: Insufficient documentation

## 2018-11-22 DIAGNOSIS — I10 Essential (primary) hypertension: Secondary | ICD-10-CM | POA: Insufficient documentation

## 2018-11-22 LAB — URINALYSIS, ROUTINE W REFLEX MICROSCOPIC
BILIRUBIN URINE: NEGATIVE
Glucose, UA: NEGATIVE mg/dL
Ketones, ur: NEGATIVE mg/dL
Leukocytes, UA: NEGATIVE
Nitrite: NEGATIVE
PROTEIN: 100 mg/dL — AB
Specific Gravity, Urine: 1.018 (ref 1.005–1.030)
pH: 6 (ref 5.0–8.0)

## 2018-11-22 MED ORDER — LIDOCAINE-EPINEPHRINE (PF) 2 %-1:200000 IJ SOLN
10.0000 mL | Freq: Once | INTRAMUSCULAR | Status: AC
  Administered 2018-11-22: 10 mL
  Filled 2018-11-22: qty 20

## 2018-11-22 MED ORDER — DOXYCYCLINE HYCLATE 100 MG PO TABS
100.0000 mg | ORAL_TABLET | Freq: Once | ORAL | Status: AC
  Administered 2018-11-22: 100 mg via ORAL
  Filled 2018-11-22: qty 1

## 2018-11-22 NOTE — ED Notes (Signed)
ED Provider at bedside. 

## 2018-11-22 NOTE — ED Provider Notes (Signed)
[place] EMERGENCY DEPARTMENT Provider Note   CSN: [id number] Arrival date & time: 11/22/18  1314     History   Chief Complaint Chief Complaint  Patient presents with  . Cellulitis    HPI Jessica Taylor is a 77 y.o. female.  77yo f w/ PMH below including AAA, CAD, HTN, HLD, PAD, kidney stones who p/w arm infection.  She has a longstanding history of cyst on her left arm near her antecubital fossa that has not caused her problems.  Last week she wore a tight, press of sleeve on her arm and later noticed that it seemed to have aggravated the skin near the cyst.  Since then, she has had some redness and drainage in that area.  3 days ago she went to an urgent care and was prescribed doxycycline for cellulitis.  She took 1 dose of the medication but then discontinued it because she had some hematuria and was concerned that it may be a side effect of the medication.  She notes that she has a history of kidney stones and is scheduled to see her urologist soon.  She had hematuria 1 day and then it completely stopped yesterday but then started back today.  She has had some very mild discomfort on her right side but no severe pain or vomiting.  She has had no dysuria or problems urinating.  No fevers or other complaints.  The history is provided by the patient.    Past Medical History:  Diagnosis Date  . Abdominal aortic aneurysm ([place])   . Acid reflux   . Anemia   . Arthritis    knees , R shoulder - tx /w injection - 11/2014  . Basal cell carcinoma (BCC) of dorsum of nose 2010   Resolved  . Cancer ([place])    basal cell on nose  . Coronary artery disease   . Hypercholesteremia   . Hypertension   . Hypothyroidism   . 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 Peripheral arterial disease (HCC)    hhigh-grade ostial bilateral calcified iliac stenosis with claudication  . Tobacco abuse   . Vertigo    when lays on left side.    Patient Active Problem List   Diagnosis Date Noted  . Iron  deficiency anemia 09/13/2018  . Acute blood loss anemia   . Acute renal failure with acute tubular necrosis superimposed on stage 2 chronic kidney disease ([place])   . Lt Acute pyelonephritis 09/11/2018  . Pyelonephritis 09/11/2018  . 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 CAD (coronary artery disease)   . Coronary artery disease involving native coronary artery of native heart without angina pectoris   . Unstable angina ([place])   . Chest pain 01/17/2016  . Hematuria 01/17/2016  . Coronary artery disease involving native coronary artery of native heart with unstable angina pectoris ([place])   . Claudication ([place]) 06/27/2015  . Acute renal insufficiency post PCI- SCr1.34 03/08/2015  . STEMI- urgent CFX PCI 03/06/15 03/06/2015  . 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 Hypokalemia 03/06/2015  . Hyperglycemia 03/06/2015  . PAD (peripheral artery disease) ([place]) 03/06/2015  . Aortoiliac occlusive disease ([place])   . Elevated troponin 02/10/2015  . GERD (gastroesophageal reflux disease) 02/08/2015  . CAD S/P prior PCI to LAD and RCA 02/08/2015  . Hypothyroidism 02/08/2015  . Hypoxia 02/08/2015  . Status post right partial knee replacement   . Primary osteoarthritis of right knee 02/04/2015  . Old  MI (myocardial infarction) 06/06/2014  . Atherosclerosis of native coronary artery of native heart without angina pectoris 06/06/2014  . Mixed hyperlipidemia 06/06/2014  . Essential hypertension 06/06/2014    Past Surgical History:  Procedure Laterality Date  . CARDIAC CATHETERIZATION  5 stents  . CARDIAC CATHETERIZATION N/A 01/20/2016   Procedure: Left Heart Cath and Coronary Angiography;  Surgeon: Peter M Martinique, MD;  Location: [place] [facility];  Service: Cardiovascular;  Laterality: N/A;  . CATARACT EXTRACTION W/PHACO Left 05/24/2014   Procedure: CATARACT EXTRACTION PHACO AND INTRAOCULAR LENS PLACEMENT (IOC);  Surgeon: Tonny Branch, MD;  [facility];  Service: Ophthalmology;  Laterality:  Left;  CDE:  9.30  . CATARACT EXTRACTION W/PHACO Right 06/18/2014   Procedure: CATARACT EXTRACTION PHACO AND INTRAOCULAR LENS PLACEMENT RIGHT EYE CDE=10.84;  Surgeon: Tonny Branch, MD;  Location: [facility];  Service: Ophthalmology;  Laterality: Right;  . CORONARY ARTERY BYPASS GRAFT N/A 01/24/2016   Procedure: CORONARY ARTERY BYPASS GRAFTING (CABG);  Surgeon: Gaye Pollack, MD;  Location: [place];  Service: Open Heart Surgery;  Laterality: N/A;  . CORONARY STENT PLACEMENT    . CORONARY STENT PLACEMENT  03/06/15   CFX DES  . CYSTOSCOPY/URETEROSCOPY/HOLMIUM LASER/STENT PLACEMENT Left 09/12/2018   Procedure: CYSTOSCOPY/URETEROSCOPY/STENT PLACEMENT;  Surgeon: Ceasar Mons, MD;  Location: [facility];  Service: Urology;  Laterality: Left;  . EYE SURGERY    . LEFT HEART CATHETERIZATION WITH CORONARY ANGIOGRAM N/A 03/06/2015   Procedure: LEFT HEART CATHETERIZATION WITH CORONARY ANGIOGRAM;  Surgeon: Lorretta Harp, MD;  Location: [facility];  Service: Cardiovascular;  Laterality: N/A;  . PARTIAL KNEE ARTHROPLASTY Right 02/04/2015   Procedure: UNICOMPARTMENTAL KNEE;  Surgeon: Dorna Leitz, MD;  Location: [place];  Service: Orthopedics;  Laterality: Right;  . PERIPHERAL VASCULAR CATHETERIZATION Bilateral 05/13/2015   Procedure: Lower Extremity Angiography;  Surgeon: Lorretta Harp, MD;  Location: [place] [facility];  Service: Cardiovascular;  Laterality: Bilateral;  . PERIPHERAL VASCULAR CATHETERIZATION N/A 05/13/2015   Procedure: Abdominal Aortogram;  Surgeon: Lorretta Harp, MD;  Location: [place] [facility];  Service: Cardiovascular;  Laterality: N/A;  . PERIPHERAL VASCULAR CATHETERIZATION Bilateral 06/27/2015   Procedure: Peripheral Vascular Intervention;  Surgeon: Lorretta Harp, MD;  Location: [place] [facility];  Service: Cardiovascular;  Laterality: Bilateral;  ILIACS  . PERIPHERAL VASCULAR CATHETERIZATION Bilateral 06/27/2015   Procedure: Peripheral Vascular Atherectomy;  Surgeon: Lorretta Harp,  MD;  Location: [place] [facility];  Service: Cardiovascular;  Laterality: Bilateral;  . TEE WITHOUT CARDIOVERSION N/A 01/24/2016   Procedure: TRANSESOPHAGEAL ECHOCARDIOGRAM (TEE);  Surgeon: Gaye Pollack, MD;  Location: [place];  Service: Open Heart Surgery;  Laterality: N/A;  . TONSILLECTOMY     age 89  . TUBAL LIGATION       OB History    Gravida  4   Para  4   Term  4   Preterm      AB      Living  4     SAB      TAB      Ectopic      Multiple      Live Births               Home Medications    Prior to Admission medications   Medication Sig Start Date End Date Taking? Authorizing Provider  aspirin EC 81 MG tablet Take 81 mg by mouth every evening.     [provider]  atorvastatin (LIPITOR) 40 MG  tablet TAKE (1) TABLET BY MOUTH ONCE DAILY. Patient taking differently: Take 40 mg by mouth every evening.  05/09/18   Burtis Junes, NP  clopidogrel (PLAVIX) 75 MG tablet TAKE (1) TABLET BY MOUTH ONCE DAILY. Patient taking differently: Take 75 mg by mouth every morning.  08/03/18   Burtis Junes, NP  cyclobenzaprine (FLEXERIL) 10 MG tablet Take 10 mg by mouth 3 (three) times daily as needed for muscle spasms.    [provider]  HYDROcodone-acetaminophen (NORCO/VICODIN) 5-325 MG tablet Take 1 tablet by mouth 2 (two) times daily as needed for moderate pain or severe pain.  04/16/17   [provider]  levothyroxine (SYNTHROID, LEVOTHROID) 50 MCG tablet Take 50 mcg by mouth daily before breakfast.    [provider]  metoprolol tartrate (LOPRESSOR) 25 MG tablet TAKE 1 TABLET BY MOUTH TWICE DAILY. Patient taking differently: Take 25 mg by mouth 2 (two) times daily.  06/06/18   Burtis Junes, NP  nitroGLYCERIN (NITROSTAT) 0.4 MG SL tablet Place 1 tablet (0.4 mg total) under the tongue every 5 (five) minutes as needed for chest pain. 05/13/18   Jerline Pain, MD  pantoprazole (PROTONIX) 40 MG tablet Take 40 mg by mouth 2 (two) times  daily.  05/17/17   [provider]    Family History Family History  Problem Relation Age of Onset  . Cancer Mother 57  . Cancer Father 33  . Hypertension Maternal Grandmother   . Stroke Maternal Grandfather   . Hypertension Son   . Heart attack Neg Hx     Social History Social History   Tobacco Use  . Smoking status: Former Smoker    Packs/day: 1.50    Years: 42.00    Pack years: 63.00    Types: Cigarettes    Last attempt to quit: 05/19/1999    Years since quitting: 19.5  . Smokeless tobacco: Never Used  Substance Use Topics  . Alcohol use: No  . Drug use: No     Allergies   Shrimp [shellfish allergy]   Review of Systems Review of Systems All other systems reviewed and are negative except that which was mentioned in HPI   Physical Exam Updated Vital Signs BP (!) 144/62   Pulse 65   Temp 98.3 F (36.8 C) (Oral)   Resp 18   SpO2 98%   Physical Exam Vitals signs and nursing note reviewed.  Constitutional:      General: She is not in acute distress.    Appearance: She is well-developed.  HENT:     Head: Normocephalic and atraumatic.  Eyes:     Conjunctiva/sclera: Conjunctivae normal.  Neck:     Musculoskeletal: Neck supple.  Cardiovascular:     Rate and Rhythm: Normal rate and regular rhythm.     Heart sounds: Normal heart sounds. No murmur.  Pulmonary:     Effort: Pulmonary effort is normal.     Breath sounds: Normal breath sounds.  Abdominal:     General: Bowel sounds are normal. There is no distension.     Palpations: Abdomen is soft.     Tenderness: There is no abdominal tenderness.  Skin:    General: Skin is warm and dry.     Comments: Small cyst to ulnar side of left arm anticubital fossa, volar surface, with adjacent area of erythema draining scant purulent bloody drainage; small circular area of surrounding erythema near drainage site  Neurological:     Mental Status: She is alert  and oriented to person, place, and time.      Comments: Fluent speech  Psychiatric:        Judgment: Judgment normal.      ED Treatments / Results  Labs (all labs ordered are listed, but only abnormal results are displayed) Labs Reviewed  URINALYSIS, ROUTINE W REFLEX MICROSCOPIC - Abnormal; Notable for the following components:      Result Value   Color, Urine RED (*)    APPearance CLOUDY (*)    Hgb urine dipstick LARGE (*)    Protein, ur 100 (*)    All other components within normal limits  URINE CULTURE    EKG None  Radiology No results found.  Procedures Ultrasound ED Soft Tissue Date/Time: 11/22/2018 5:28 PM Performed by: Sharlett Iles, MD Authorized by: Sharlett Iles, MD   Procedure details:    Indications: localization of abscess     Transverse view:  Visualized   Longitudinal view:  Visualized   Images: archived   Location:    Location: upper extremity     Side:  Left Findings:     abscess present .Marland KitchenIncision and Drainage Date/Time: 11/22/2018 5:28 PM Performed by: Sharlett Iles, MD Authorized by: Sharlett Iles, MD   Consent:    Consent obtained:  Verbal   Consent given by:  Patient   Risks discussed:  Bleeding and pain   Alternatives discussed:  No treatment Location:    Type:  Abscess   Size:  0.5 cm   Location:  Upper extremity   Upper extremity location:  Arm   Arm location:  L upper arm Pre-procedure details:    Skin preparation:  Betadine Anesthesia (see MAR for exact dosages):    Anesthesia method:  Local infiltration   Local anesthetic:  Lidocaine 1% WITH epi Procedure type:    Complexity:  Simple Procedure details:    Incision types:  Single straight   Incision depth:  Dermal   Scalpel blade:  11   Wound management:  Irrigated with saline   Drainage:  Purulent   Drainage amount:  Scant   Wound treatment:  Wound left open   Packing materials:  None Post-procedure details:    Patient tolerance of procedure:  Tolerated well, no immediate  complications   (including critical care time)  Medications Ordered in ED Medications  lidocaine-EPINEPHrine (XYLOCAINE W/EPI) 2 %-1:200000 (PF) injection 10 mL (has no administration in time range)  doxycycline (VIBRA-TABS) tablet 100 mg (has no administration in time range)     Initial Impression / Assessment and Plan / ED Course  I have reviewed the triage vital signs and the nursing notes.  Pertinent labs that were available during my care of the patient were reviewed by me and considered in my medical decision making (see chart for details).     On exam she had a non-tender cyst and near cyst, an area that looked like superficial abscess w/ surrounding cellulitis.   Incised and drained area, see procedure note for details. I discussed her hematuria and related symptoms.  UA contains blood but is nitrite and leukocyte negative making infection unlikely.  Patient feels comfortable going up with her urologist regarding this issue and does not want any further work-up here for this.  Her discussion of options including clindamycin, doxycycline, or Bactrim/Keflex combination, patient feels comfortable continuing doxycycline course at home.  She understands return precautions.  Final Clinical Impressions(s) / ED Diagnoses   Final diagnoses:  None    ED  Discharge Orders    None       Little, Wenda Overland, MD 11/22/18 1730

## 2018-11-22 NOTE — ED Notes (Signed)
Patient verbalizes understanding of discharge instructions. Opportunity for questioning and answers were provided. Armband removed by staff, pt discharged from ED in wheelchair.  

## 2018-11-22 NOTE — Discharge Instructions (Addendum)
Take doxycycline as directed (twice daily for 7 days.)

## 2018-11-22 NOTE — ED Triage Notes (Signed)
Pt in c/o possible cellulitis to her left arm, [facility] and was started on doxy on Saturday but developed blood in her urine so she stopped taking it

## 2018-11-23 ENCOUNTER — Other Ambulatory Visit: Payer: Self-pay

## 2018-11-23 ENCOUNTER — Emergency Department (HOSPITAL_COMMUNITY): Payer: Medicare Other

## 2018-11-23 ENCOUNTER — Encounter (HOSPITAL_COMMUNITY): Payer: Self-pay | Admitting: Emergency Medicine

## 2018-11-23 ENCOUNTER — Emergency Department (HOSPITAL_COMMUNITY)
Admission: EM | Admit: 2018-11-23 | Discharge: 2018-11-23 | Disposition: A | Discharge status: Home / Self Care | Payer: Medicare Other | Attending: Emergency Medicine | Admitting: Emergency Medicine

## 2018-11-23 DIAGNOSIS — E039 Hypothyroidism, unspecified: Secondary | ICD-10-CM | POA: Insufficient documentation

## 2018-11-23 DIAGNOSIS — Z87891 Personal history of nicotine dependence: Secondary | ICD-10-CM | POA: Insufficient documentation

## 2018-11-23 DIAGNOSIS — Z7982 Long term (current) use of aspirin: Secondary | ICD-10-CM | POA: Diagnosis not present

## 2018-11-23 DIAGNOSIS — I129 Hypertensive chronic kidney disease with stage 1 through stage 4 chronic kidney disease, or unspecified chronic kidney disease: Secondary | ICD-10-CM | POA: Insufficient documentation

## 2018-11-23 DIAGNOSIS — N182 Chronic kidney disease, stage 2 (mild): Secondary | ICD-10-CM | POA: Diagnosis not present

## 2018-11-23 DIAGNOSIS — Z951 Presence of aortocoronary bypass graft: Secondary | ICD-10-CM | POA: Insufficient documentation

## 2018-11-23 DIAGNOSIS — Z7902 Long term (current) use of antithrombotics/antiplatelets: Secondary | ICD-10-CM | POA: Diagnosis not present

## 2018-11-23 DIAGNOSIS — I2510 Atherosclerotic heart disease of native coronary artery without angina pectoris: Secondary | ICD-10-CM | POA: Insufficient documentation

## 2018-11-23 DIAGNOSIS — Z79899 Other long term (current) drug therapy: Secondary | ICD-10-CM | POA: Insufficient documentation

## 2018-11-23 DIAGNOSIS — N201 Calculus of ureter: Secondary | ICD-10-CM | POA: Diagnosis not present

## 2018-11-23 DIAGNOSIS — R310 Gross hematuria: Secondary | ICD-10-CM | POA: Diagnosis present

## 2018-11-23 LAB — COMPREHENSIVE METABOLIC PANEL
ALT: 15 U/L (ref 0–44)
AST: 18 U/L (ref 15–41)
Albumin: 4 g/dL (ref 3.5–5.0)
Alkaline Phosphatase: 74 U/L (ref 38–126)
Anion gap: 7 (ref 5–15)
BUN: 20 mg/dL (ref 8–23)
CO2: 24 mmol/L (ref 22–32)
Calcium: 9.3 mg/dL (ref 8.9–10.3)
Chloride: 108 mmol/L (ref 98–111)
Creatinine, Ser: 1.69 mg/dL — ABNORMAL HIGH (ref 0.44–1.00)
GFR calc non Af Amer: 28 mL/min — ABNORMAL LOW (ref >60)
GFR, EST AFRICAN AMERICAN: 33 mL/min — AB (ref >60)
Glucose, Bld: 142 mg/dL — ABNORMAL HIGH (ref 70–99)
Potassium: 4.2 mmol/L (ref 3.5–5.1)
Sodium: 139 mmol/L (ref 135–145)
TOTAL PROTEIN: 6.9 g/dL (ref 6.5–8.1)
Total Bilirubin: 0.6 mg/dL (ref 0.3–1.2)

## 2018-11-23 LAB — URINALYSIS, ROUTINE W REFLEX MICROSCOPIC
Bacteria, UA: NONE SEEN
RBC / HPF: >50 RBC/hpf — ABNORMAL HIGH (ref 0–5)

## 2018-11-23 LAB — CBC
HCT: 35.1 % — ABNORMAL LOW (ref 36.0–46.0)
Hemoglobin: 10.1 g/dL — ABNORMAL LOW (ref 12.0–15.0)
MCH: 24.6 pg — AB (ref 26.0–34.0)
MCHC: 28.8 g/dL — ABNORMAL LOW (ref 30.0–36.0)
MCV: 85.6 fL (ref 80.0–100.0)
Platelets: 218 10*3/uL (ref 150–400)
RBC: 4.1 MIL/uL (ref 3.87–5.11)
RDW: 18.1 % — ABNORMAL HIGH (ref 11.5–15.5)
WBC: 13.2 10*3/uL — ABNORMAL HIGH (ref 4.0–10.5)
nRBC: 0 % (ref 0.0–0.2)

## 2018-11-23 LAB — URINE CULTURE: CULTURE: NO GROWTH

## 2018-11-23 MED ORDER — ONDANSETRON HCL 4 MG/2ML IJ SOLN
4.0000 mg | Freq: Once | INTRAMUSCULAR | Status: AC
  Administered 2018-11-23: 4 mg via INTRAVENOUS
  Filled 2018-11-23: qty 2

## 2018-11-23 MED ORDER — OXYCODONE-ACETAMINOPHEN 5-325 MG PO TABS: 1.0000 | ORAL_TABLET | ORAL | 0 refills | Status: DC | PRN

## 2018-11-23 MED ORDER — OXYCODONE-ACETAMINOPHEN 5-325 MG PO TABS
1.0000 | ORAL_TABLET | Freq: Once | ORAL | Status: AC
  Administered 2018-11-23: 1 via ORAL
  Filled 2018-11-23: qty 1

## 2018-11-23 MED ORDER — ONDANSETRON 4 MG PO TBDP: 4.0000 mg | ORAL_TABLET | Freq: Three times a day (TID) | ORAL | 0 refills | Status: DC | PRN

## 2018-11-23 MED ORDER — MORPHINE SULFATE (PF) 4 MG/ML IV SOLN
4.0000 mg | Freq: Once | INTRAVENOUS | Status: AC
  Administered 2018-11-23: 4 mg via INTRAVENOUS
  Filled 2018-11-23: qty 1

## 2018-11-23 MED ORDER — FENTANYL CITRATE (PF) 100 MCG/2ML IJ SOLN
50.0000 ug | Freq: Once | INTRAMUSCULAR | Status: AC
  Administered 2018-11-23: 50 ug via INTRAVENOUS
  Filled 2018-11-23: qty 2

## 2018-11-23 NOTE — Discharge Instructions (Signed)
RETURN TO ER IF YOU HAVE ANY FEVER, WORSENING PAIN, PROBLEMS URINATING, OR SEVERE VOMITING.

## 2018-11-23 NOTE — ED Provider Notes (Signed)
[facility] DEPT Provider Note   CSN: [id number] Arrival date & time: 11/23/18  5397     History   Chief Complaint Chief Complaint  Patient presents with  . Hematuria    HPI Jessica Taylor is a 78 y.o. female.  78 year old female with past medical history below including AAA, PAD, CAD, kidney stones who presents with hematuria and left-sided pain.  I actually saw this patient yesterday in the ED for a left arm infection.  At the time she was having hematuria but no other symptoms and was going to follow-up with her urologist.  She has been taking antibiotics for cellulitis.  This morning she began having pain in her left abdomen and left flank that has been constant, intermittently becoming severe, and associated with nausea and vomiting.  She has continued to have hematuria and passed clots.  No pain with urination.  No diarrhea or fevers.  She reports previous history of stenting by Dr. Lovena Neighbours.  No pain medications prior to arrival.  The history is provided by the patient.  Hematuria     Past Medical History:  Diagnosis Date  . Abdominal aortic aneurysm ([place])   . Acid reflux   . Anemia   . Arthritis    knees , R shoulder - tx /w injection - 11/2014  . Basal cell carcinoma (BCC) of dorsum of nose 2010   Resolved  . Cancer ([place])    basal cell on nose  . Coronary artery disease   . Hypercholesteremia   . Hypertension   . Hypothyroidism   . MI, old 62  . Peripheral arterial disease (H[facility])    hhigh-grade ostial bilateral calcified iliac stenosis with claudication  . Tobacco abuse   . Vertigo    when lays on left side.    Patient Active Problem List   Diagnosis Date Noted  . Iron deficiency anemia 09/13/2018  . Acute blood loss anemia   . Acute renal failure with acute tubular necrosis superimposed on stage 2 chronic kidney disease ([place])   . Lt Acute pyelonephritis 09/11/2018  . Pyelonephritis 09/11/2018  . 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  CAD (coronary artery disease)   . Coronary artery disease involving native coronary artery of native heart without angina pectoris   . Unstable angina ([place])   . Chest pain 01/17/2016  . Hematuria 01/17/2016  . Coronary artery disease involving native coronary artery of native heart with unstable angina pectoris ([address])   . Claudication ([place]) 06/27/2015  . Acute renal insufficiency post PCI- SCr1.34 03/08/2015  . STEMI- urgent CFX PCI 03/06/15 03/06/2015  . 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 Hypokalemia 03/06/2015  . Hyperglycemia 03/06/2015  . PAD (peripheral artery disease) ([place]) 03/06/2015  . Aortoiliac occlusive disease ([place])   . Elevated troponin 02/10/2015  . GERD (gastroesophageal reflux disease) 02/08/2015  . CAD S/P prior PCI to LAD and RCA 02/08/2015  . Hypothyroidism 02/08/2015  . Hypoxia 02/08/2015  . Status post right partial knee replacement   . Primary osteoarthritis of right knee 02/04/2015  . 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 Atherosclerosis of native coronary artery of native heart without angina pectoris 06/06/2014  . Mixed hyperlipidemia 06/06/2014  . Essential hypertension 06/06/2014    Past Surgical History:  Procedure Laterality Date  . CARDIAC CATHETERIZATION  5 stents  . CARDIAC CATHETERIZATION N/A 01/20/2016   Procedure: Left Heart Cath and Coronary Angiography;  Surgeon: Peter M Martinique,  MD;  Location: [facility];  Service: Cardiovascular;  Laterality: N/A;  . CATARACT EXTRACTION W/PHACO Left 05/24/2014   Procedure: CATARACT EXTRACTION PHACO AND INTRAOCULAR LENS PLACEMENT (IOC);  Surgeon: Tonny Branch, MD;  Location: [facility];  Service: Ophthalmology;  Laterality: Left;  CDE:  9.30  . CATARACT EXTRACTION W/PHACO Right 06/18/2014   Procedure: CATARACT EXTRACTION PHACO AND INTRAOCULAR LENS PLACEMENT RIGHT EYE CDE=10.84;  Surgeon: Tonny Branch, MD;  Location: [facility];  Service: Ophthalmology;  Laterality: Right;  . CORONARY ARTERY  BYPASS GRAFT N/A 01/24/2016   Procedure: CORONARY ARTERY BYPASS GRAFTING (CABG);  Surgeon: Gaye Pollack, MD;  Location: [facility];  Service: Open Heart Surgery;  Laterality: N/A;  . CORONARY STENT PLACEMENT    . CORONARY STENT PLACEMENT  03/06/15   CFX DES  . CYSTOSCOPY/URETEROSCOPY/HOLMIUM LASER/STENT PLACEMENT Left 09/12/2018   Procedure: CYSTOSCOPY/URETEROSCOPY/STENT PLACEMENT;  Surgeon: Ceasar Mons, MD;  Location: [facility];  Service: Urology;  Laterality: Left;  . EYE SURGERY    . LEFT HEART CATHETERIZATION WITH CORONARY ANGIOGRAM N/A 03/06/2015   Procedure: LEFT HEART CATHETERIZATION WITH CORONARY ANGIOGRAM;  Surgeon: Lorretta Harp, MD;  Location[facility];  Service: Cardiovascular;  Laterality: N/A;  . PARTIAL KNEE ARTHROPLASTY Right 02/04/2015   Procedure: UNICOMPARTMENTAL KNEE;  Surgeon: Dorna Leitz, MD;  Location: [facility];  Service: Orthopedics;  Laterality: Right;  . PERIPHERAL VASCULAR CATHETERIZATION Bilateral 05/13/2015   Procedure: Lower Extremity Angiography;  Surgeon: Lorretta Harp, MD;  Location: [facility];  Service: Cardiovascular;  Laterality: Bilateral;  . PERIPHERAL VASCULAR CATHETERIZATION N/A 05/13/2015   Procedure: Abdominal Aortogram;  Surgeon: Lorretta Harp, MD;  Location: [facility];  Service: Cardiovascular;  Laterality: N/A;  . PERIPHERAL VASCULAR CATHETERIZATION Bilateral 06/27/2015   Procedure: Peripheral Vascular Intervention;  Surgeon: Lorretta Harp, MD;  Location: [facility];  Service: Cardiovascular;  Laterality: Bilateral;  ILIACS  . PERIPHERAL VASCULAR CATHETERIZATION Bilateral 06/27/2015   Procedure: Peripheral Vascular Atherectomy;  Surgeon: Lorretta Harp, MD;  Location: [facility];  Service: Cardiovascular;  Laterality: Bilateral;  . TEE WITHOUT CARDIOVERSION N/A 01/24/2016   Procedure: TRANSESOPHAGEAL ECHOCARDIOGRAM (TEE);  Surgeon: Gaye Pollack, MD;  Location: [facility];  Service: Open Heart Surgery;   Laterality: N/A;  . TONSILLECTOMY     age 67  . TUBAL LIGATION       OB History    Gravida  4   Para  4   Term  4   Preterm      AB      Living  4     SAB      TAB      Ectopic      Multiple      Live Births               Home Medications    Prior to Admission medications   Medication Sig Start Date End Date Taking? Authorizing Provider  aspirin EC 81 MG tablet Take 81 mg by mouth every evening.    Yes [provider]  atorvastatin (LIPITOR) 40 MG tablet TAKE (1) TABLET BY MOUTH ONCE DAILY. Patient taking differently: Take 40 mg by mouth every evening.  05/09/18  Yes Burtis Junes, NP  clopidogrel (PLAVIX) 75 MG tablet TAKE (1) TABLET BY MOUTH ONCE DAILY. Patient taking differently: Take 75 mg by mouth every morning.  08/03/18  Yes Burtis Junes, NP  HYDROcodone-acetaminophen (NORCO/VICODIN) 5-325 MG tablet Take 1  tablet by mouth 2 (two) times daily as needed for moderate pain or severe pain.  04/16/17  Yes [provider]  levothyroxine (SYNTHROID, LEVOTHROID) 50 MCG tablet Take 50 mcg by mouth daily before breakfast.   Yes [provider]  metoprolol tartrate (LOPRESSOR) 25 MG tablet TAKE 1 TABLET BY MOUTH TWICE DAILY. Patient taking differently: Take 25 mg by mouth 2 (two) times daily.  06/06/18  Yes Burtis Junes, NP  pantoprazole (PROTONIX) 40 MG tablet Take 40 mg by mouth 2 (two) times daily.  05/17/17  Yes [provider]  nitroGLYCERIN (NITROSTAT) 0.4 MG SL tablet Place 1 tablet (0.4 mg total) under the tongue every 5 (five) minutes as needed for chest pain. 05/13/18   Jerline Pain, MD  ondansetron (ZOFRAN ODT) 4 MG disintegrating tablet Take 1 tablet (4 mg total) by mouth every 8 (eight) hours as needed for nausea or vomiting. 11/23/18   , Wenda Overland, MD  oxyCODONE-acetaminophen (PERCOCET) 5-325 MG tablet Take 1 tablet by mouth every 4 (four) hours as needed. 11/23/18   , Wenda Overland, MD    Family  History Family History  Problem Relation Age of Onset  . Cancer Mother 13  . Cancer Father 58  . Hypertension Maternal Grandmother   . Stroke Maternal Grandfather   . Hypertension Son   . Heart attack Neg Hx     Social History Social History   Tobacco Use  . Smoking status: Former Smoker    Packs/day: 1.50    Years: 42.00    Pack years: 63.00    Types: Cigarettes    Last attempt to quit: 05/19/1999    Years since quitting: 19.5  . Smokeless tobacco: Never Used  Substance Use Topics  . Alcohol use: No  . Drug use: No     Allergies   Shrimp [shellfish allergy]   Review of Systems Review of Systems  Genitourinary: Positive for hematuria.   All other systems reviewed and are negative except that which was mentioned in HPI   Physical Exam Updated Vital Signs BP (!) 145/75   Pulse 84   Temp 98 F (36.7 C) (Oral)   Resp 16   Ht 5\' 3"  (1.6 m)   Wt 71.2 kg   SpO2 95%   BMI 27.81 kg/m   Physical Exam Vitals signs and nursing note reviewed.  Constitutional:      General: She is not in acute distress.    Appearance: She is well-developed.     Comments: uncomfortable  HENT:     Head: Normocephalic and atraumatic.  Eyes:     Conjunctiva/sclera: Conjunctivae normal.  Neck:     Musculoskeletal: Neck supple.  Cardiovascular:     Rate and Rhythm: Normal rate and regular rhythm.     Heart sounds: Normal heart sounds. No murmur.  Pulmonary:     Effort: Pulmonary effort is normal.     Breath sounds: Normal breath sounds.  Abdominal:     General: Bowel sounds are normal. There is no distension.     Palpations: Abdomen is soft.     Tenderness: There is no abdominal tenderness.  Skin:    General: Skin is warm and dry.     Comments: Healing incision site on L arm medial to anticubital fossa, improved surrounding redness compared to yesterday's exam  Neurological:     Mental Status: She is alert and oriented to person, place, and time.     Comments: Fluent speech    Psychiatric:  Judgment: Judgment normal.      ED Treatments / Results  Labs (all labs ordered are listed, but only abnormal results are displayed) Labs Reviewed  URINALYSIS, ROUTINE W REFLEX MICROSCOPIC - Abnormal; Notable for the following components:      Result Value   APPearance CLOUDY (*)    Glucose, UA   (*)    Value: TEST NOT REPORTED DUE TO COLOR INTERFERENCE OF URINE PIGMENT   Hgb urine dipstick   (*)    Value: TEST NOT REPORTED DUE TO COLOR INTERFERENCE OF URINE PIGMENT   Bilirubin Urine   (*)    Value: TEST NOT REPORTED DUE TO COLOR INTERFERENCE OF URINE PIGMENT   Ketones, ur   (*)    Value: TEST NOT REPORTED DUE TO COLOR INTERFERENCE OF URINE PIGMENT   Protein, ur   (*)    Value: TEST NOT REPORTED DUE TO COLOR INTERFERENCE OF URINE PIGMENT   Nitrite   (*)    Value: TEST NOT REPORTED DUE TO COLOR INTERFERENCE OF URINE PIGMENT   Leukocytes, UA   (*)    Value: TEST NOT REPORTED DUE TO COLOR INTERFERENCE OF URINE PIGMENT   RBC / HPF >50 (*)    All other components within normal limits  CBC - Abnormal; Notable for the following components:   WBC 13.2 (*)    Hemoglobin 10.1 (*)    HCT 35.1 (*)    MCH 24.6 (*)    MCHC 28.8 (*)    RDW 18.1 (*)    All other components within normal limits  COMPREHENSIVE METABOLIC PANEL - Abnormal; Notable for the following components:   Glucose, Bld 142 (*)    Creatinine, Ser 1.69 (*)    GFR calc non Af Amer 28 (*)    GFR calc Af Amer 33 (*)    All other components within normal limits    EKG None  Radiology Ct Renal Stone Study  Result Date: 11/23/2018 CLINICAL DATA:  Left flank pain EXAM: CT ABDOMEN AND PELVIS WITHOUT CONTRAST TECHNIQUE: Multidetector CT imaging of the abdomen and pelvis was performed following the standard protocol without IV contrast. COMPARISON:  October 19, 2018 FINDINGS: Lower chest: Minimal atelectasis of posterior lung bases are noted. Hepatobiliary: 10 mm cyst is identified in the anterior liver  unchanged compared to prior exam. The liver is otherwise unremarkable. Gallbladder is normal. The biliary tree is normal. Pancreas: Unremarkable. No pancreatic ductal dilatation or surrounding inflammatory changes. Spleen: Normal in size without focal abnormality. Adrenals/Urinary Tract: The right kidney is slightly atrophic without hydronephrosis. There is left perinephric stranding with left hydronephrosis. There is contrast in the left collecting system. No stone is identified in the left collecting system. The bladder is decompressed limiting evaluation. Stomach/Bowel: There is mild stranding and inflammation surrounding the cecum. The appendix is normal. There is diverticulosis of colon. There is no small bowel obstruction. There is a small hiatal hernia. Vascular/Lymphatic: There is atherosclerosis of the abdominal aorta with infrarenal dilatation of the aorta measuring 3.4 cm. No abdominal or pelvic lymphadenopathy is identified. Reproductive: Uterus and bilateral adnexa are unremarkable. Other: None. Musculoskeletal: Degenerative joint changes of the spine are noted. IMPRESSION: Left perinephric stranding with left hydronephrosis. No definite stone is identified in the left collecting system. There is contrast in the left collecting system. Clinical correlation regarding recent intravenous contrast administration is suggested. The findings are nonspecific. Differential diagnosis includes recently passed stone or pyelonephritis. Electronically Signed   By: Mallie Darting.D.  On: 11/23/2018 17:02    Procedures Procedures (including critical care time)  Medications Ordered in ED Medications  ondansetron (ZOFRAN) injection 4 mg (4 mg Intravenous Given 11/23/18 1715)  morphine 4 MG/ML injection 4 mg (4 mg Intravenous Given 11/23/18 1715)  morphine 4 MG/ML injection 4 mg (4 mg Intravenous Given 11/23/18 1815)  ondansetron (ZOFRAN) injection 4 mg (4 mg Intravenous Given 11/23/18 1815)  fentaNYL (SUBLIMAZE)  injection 50 mcg (50 mcg Intravenous Given 11/23/18 2039)  oxyCODONE-acetaminophen (PERCOCET/ROXICET) 5-325 MG per tablet 1 tablet (1 tablet Oral Given 11/23/18 2043)     Initial Impression / Assessment and Plan / ED Course  I have reviewed the triage vital signs and the nursing notes.  Pertinent labs & imaging results that were available during my care of the patient were reviewed by me and considered in my medical decision making (see chart for details).     Non-toxic on exam, afebrile, hypertensive. Cellulitis on arm is improving. Gave zofran and morphine.   Lab work shows creatinine 1.79, WBC 13.2, UA with gross blood.  CT renal study shows left perinephric stranding and hydronephrosis without definite stone.  I suspect she has recently passed a stone as her symptoms were sudden in onset and she had no preceding UTI symptoms to suggest pyelonephritis.  Patient agrees that her symptoms are very similar to previous episodes of passing kidney stone.  She already has an appointment scheduled with her urologist.  After above medications her pain is improved to 2/10 and she feels comfortable managing her symptoms at home.  Provided with pain and nausea medications and discussed supportive measures.  I have extensively reviewed return precautions including fever, intractable pain, or severe vomiting.  Patient voiced understanding.  Final Clinical Impressions(s) / ED Diagnoses   Final diagnoses:  Gross hematuria  Left ureteral stone    ED Discharge Orders         Ordered    oxyCODONE-acetaminophen (PERCOCET) 5-325 MG tablet  Every 4 hours PRN     11/23/18 2205    ondansetron (ZOFRAN ODT) 4 MG disintegrating tablet  Every 8 hours PRN     11/23/18 2205           , Wenda Overland, MD 11/24/18 [phone]

## 2018-11-23 NOTE — ED Notes (Signed)
Pt. Aware of urine specimen. Pt. Unable to urinate at this time. Will collect urine when pt. Voids. Nurse aware.

## 2018-11-23 NOTE — ED Triage Notes (Addendum)
Pt complaint of left flank pain, hematuria, and nausea since Friday; stopped taking Plavix 2 days ago.

## 2019-01-03 ENCOUNTER — Other Ambulatory Visit: Payer: Self-pay | Admitting: Nurse Practitioner

## 2019-04-13 ENCOUNTER — Other Ambulatory Visit: Payer: Self-pay | Admitting: Nurse Practitioner

## 2019-06-21 ENCOUNTER — Emergency Department (HOSPITAL_COMMUNITY)
Admission: EM | Admit: 2019-06-21 | Discharge: 2019-06-22 | Disposition: A | Discharge status: Home / Self Care | Payer: Medicare Other | Attending: Emergency Medicine | Admitting: Emergency Medicine

## 2019-06-21 ENCOUNTER — Encounter (HOSPITAL_COMMUNITY): Payer: Self-pay | Admitting: Emergency Medicine

## 2019-06-21 ENCOUNTER — Other Ambulatory Visit: Payer: Self-pay

## 2019-06-21 DIAGNOSIS — Z7982 Long term (current) use of aspirin: Secondary | ICD-10-CM | POA: Diagnosis not present

## 2019-06-21 DIAGNOSIS — D649 Anemia, unspecified: Secondary | ICD-10-CM

## 2019-06-21 DIAGNOSIS — Z951 Presence of aortocoronary bypass graft: Secondary | ICD-10-CM | POA: Diagnosis not present

## 2019-06-21 DIAGNOSIS — Z85828 Personal history of other malignant neoplasm of skin: Secondary | ICD-10-CM | POA: Insufficient documentation

## 2019-06-21 DIAGNOSIS — R0602 Shortness of breath: Secondary | ICD-10-CM | POA: Diagnosis not present

## 2019-06-21 DIAGNOSIS — E039 Hypothyroidism, unspecified: Secondary | ICD-10-CM | POA: Diagnosis not present

## 2019-06-21 DIAGNOSIS — Z96651 Presence of right artificial knee joint: Secondary | ICD-10-CM | POA: Insufficient documentation

## 2019-06-21 DIAGNOSIS — I129 Hypertensive chronic kidney disease with stage 1 through stage 4 chronic kidney disease, or unspecified chronic kidney disease: Secondary | ICD-10-CM | POA: Insufficient documentation

## 2019-06-21 DIAGNOSIS — R531 Weakness: Secondary | ICD-10-CM | POA: Insufficient documentation

## 2019-06-21 DIAGNOSIS — K922 Gastrointestinal hemorrhage, unspecified: Secondary | ICD-10-CM | POA: Diagnosis not present

## 2019-06-21 DIAGNOSIS — I252 Old myocardial infarction: Secondary | ICD-10-CM | POA: Diagnosis not present

## 2019-06-21 DIAGNOSIS — I2510 Atherosclerotic heart disease of native coronary artery without angina pectoris: Secondary | ICD-10-CM | POA: Insufficient documentation

## 2019-06-21 DIAGNOSIS — Z87891 Personal history of nicotine dependence: Secondary | ICD-10-CM | POA: Diagnosis not present

## 2019-06-21 DIAGNOSIS — Z79899 Other long term (current) drug therapy: Secondary | ICD-10-CM | POA: Insufficient documentation

## 2019-06-21 DIAGNOSIS — N182 Chronic kidney disease, stage 2 (mild): Secondary | ICD-10-CM | POA: Insufficient documentation

## 2019-06-21 DIAGNOSIS — R799 Abnormal finding of blood chemistry, unspecified: Secondary | ICD-10-CM | POA: Diagnosis present

## 2019-06-21 LAB — CBC
HCT: 26.5 % — ABNORMAL LOW (ref 36.0–46.0)
Hemoglobin: 7 g/dL — ABNORMAL LOW (ref 12.0–15.0)
MCH: 19.1 pg — ABNORMAL LOW (ref 26.0–34.0)
MCHC: 26.4 g/dL — ABNORMAL LOW (ref 30.0–36.0)
MCV: 72.4 fL — ABNORMAL LOW (ref 80.0–100.0)
Platelets: 249 10*3/uL (ref 150–400)
RBC: 3.66 MIL/uL — ABNORMAL LOW (ref 3.87–5.11)
RDW: 19.2 % — ABNORMAL HIGH (ref 11.5–15.5)
WBC: 7.7 10*3/uL (ref 4.0–10.5)
nRBC: 0 % (ref 0.0–0.2)

## 2019-06-21 LAB — COMPREHENSIVE METABOLIC PANEL
ALT: 8 U/L (ref 0–44)
AST: 14 U/L — ABNORMAL LOW (ref 15–41)
Albumin: 3.4 g/dL — ABNORMAL LOW (ref 3.5–5.0)
Alkaline Phosphatase: 87 U/L (ref 38–126)
Anion gap: 9 (ref 5–15)
BUN: 21 mg/dL (ref 8–23)
CO2: 24 mmol/L (ref 22–32)
Calcium: 9.3 mg/dL (ref 8.9–10.3)
Chloride: 105 mmol/L (ref 98–111)
Creatinine, Ser: 1.59 mg/dL — ABNORMAL HIGH (ref 0.44–1.00)
GFR calc Af Amer: 35 mL/min — ABNORMAL LOW (ref >60)
GFR calc non Af Amer: 30 mL/min — ABNORMAL LOW (ref >60)
Glucose, Bld: 112 mg/dL — ABNORMAL HIGH (ref 70–99)
Potassium: 4.1 mmol/L (ref 3.5–5.1)
Sodium: 138 mmol/L (ref 135–145)
Total Bilirubin: 0.7 mg/dL (ref 0.3–1.2)
Total Protein: 6.4 g/dL — ABNORMAL LOW (ref 6.5–8.1)

## 2019-06-21 LAB — PREPARE RBC (CROSSMATCH)

## 2019-06-21 LAB — POC OCCULT BLOOD, ED: Fecal Occult Bld: POSITIVE — AB

## 2019-06-21 MED ORDER — SODIUM CHLORIDE 0.9% IV SOLUTION
Freq: Once | INTRAVENOUS | Status: AC
  Administered 2019-06-21: via INTRAVENOUS

## 2019-06-21 NOTE — ED Triage Notes (Signed)
Pt sent by pcp after having blood work done pt was told to come to ED for a low hemoglobin. Pt states she has had to have a blood transfusion in the past and iron infusions. Pt states she has not been having in black or bloody stool.

## 2019-06-22 ENCOUNTER — Encounter: Payer: Self-pay | Admitting: Gastroenterology

## 2019-06-22 NOTE — ED Provider Notes (Signed)
[facility]ERGENCY DEPARTMENT Provider Note   CSN: [id number] Arrival date & time: 06/21/19  1715    History   Chief Complaint Chief Complaint  Patient presents with  . Abnormal Lab    HPI Jessica Taylor is a 78 y.o. female.     History of anemia requiring blood/iron transfusions in past but never worked up here with hemoglobin of 7.0.  She denies any blood in her stool or obvious hemorrhage elsewhere.  She denies any vaginal bleeding.  She states that she does have some shortness of breath weakness that is progressively worsened over the last few weeks.  She also notes that her hair and nails somehow show that she is anemic.   Abnormal Lab Time since result:  Today Patient referred by:  PCP Resulting agency:  External Result type: hematology   Hematology:    Hemoglobin:  Low   Past Medical History:  Diagnosis Date  . Abdominal aortic aneurysm ([place])   . Acid reflux   . Anemia   . Arthritis    knees , R shoulder - tx /w injection - 11/2014  . Basal cell carcinoma (BCC) of dorsum of nose 2010   Resolved  . Cancer ([place])    basal cell on nose  . Coronary artery disease   . Hypercholesteremia   . Hypertension   . Hypothyroidism   . 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 Peripheral arterial disease (HCC)    hhigh-grade ostial bilateral calcified iliac stenosis with claudication  . Tobacco abuse   . Vertigo    when lays on left side.    Patient Active Problem List   Diagnosis Date Noted  . Iron deficiency anemia 09/13/2018  . Acute blood loss anemia   . Acute renal failure with acute tubular necrosis superimposed on stage 2 chronic kidney disease ([place])   . Lt Acute pyelonephritis 09/11/2018  . Pyelonephritis 09/11/2018  . 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 CAD (coronary artery disease)   . Coronary artery disease involving native coronary artery of native heart without angina pectoris   . Unstable angina ([place])   . Chest pain 01/17/2016  . Hematuria 01/17/2016  .  Coronary artery disease involving native coronary artery of native heart with unstable angina pectoris ([place])   . Claudication ([place]) 06/27/2015  . Acute renal insufficiency post PCI- SCr1.34 03/08/2015  . STEMI- urgent CFX PCI 03/06/15 03/06/2015  . 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 Hypokalemia 03/06/2015  . Hyperglycemia 03/06/2015  . PAD (peripheral artery disease) ([place]) 03/06/2015  . 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 Elevated troponin 02/10/2015  . GERD (gastroesophageal reflux disease) 02/08/2015  . CAD S/P prior PCI to LAD and RCA 02/08/2015  . Hypothyroidism 02/08/2015  . Hypoxia 02/08/2015  . Status post right partial knee replacement   . Primary osteoarthritis of right knee 02/04/2015  . 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 Atherosclerosis of native coronary artery of native heart without angina pectoris 06/06/2014  . Mixed hyperlipidemia 06/06/2014  . Essential hypertension 06/06/2014    Past Surgical History:  Procedure Laterality Date  . CARDIAC CATHETERIZATION  5 stents  . CARDIAC CATHETERIZATION N/A 01/20/2016   Procedure: Left Heart Cath and Coronary Angiography;  Surgeon: Peter M Martinique, MD;  Location: [facility];  Service: Cardiovascular;  Laterality: N/A;  . CATARACT EXTRACTION W/PHACO Left 05/24/2014   Procedure: CATARACT EXTRACTION PHACO AND INTRAOCULAR LENS PLACEMENT (IOC);  Surgeon: Levada Dy  Geoffry Paradise, MD;  Location: [facility];  Service: Ophthalmology;  Laterality: Left;  CDE:  9.30  . CATARACT EXTRACTION W/PHACO Right 06/18/2014   Procedure: CATARACT EXTRACTION PHACO AND INTRAOCULAR LENS PLACEMENT RIGHT EYE CDE=10.84;  Surgeon: Tonny Branch, MD;  Location: [facility];  Service: Ophthalmology;  Laterality: Right;  . CORONARY ARTERY BYPASS GRAFT N/A 01/24/2016   Procedure: CORONARY ARTERY BYPASS GRAFTING (CABG);  Surgeon: Gaye Pollack, MD;  Location: [facility];  Service: Open Heart Surgery;  Laterality: N/A;  . CORONARY STENT PLACEMENT    .  CORONARY STENT PLACEMENT  03/06/15   CFX DES  . CYSTOSCOPY/URETEROSCOPY/HOLMIUM LASER/STENT PLACEMENT Left 09/12/2018   Procedure: CYSTOSCOPY/URETEROSCOPY/STENT PLACEMENT;  Surgeon: Ceasar Mons, MD;  Location: [facility];  Service: Urology;  Laterality: Left;  . EYE SURGERY    . LEFT HEART CATHETERIZATION WITH CORONARY ANGIOGRAM N/A 03/06/2015   Procedure: LEFT HEART CATHETERIZATION WITH CORONARY ANGIOGRAM;  Surgeon: Lorretta Harp, MD;  Location: [facility];  Service: Cardiovascular;  Laterality: N/A;  . PARTIAL KNEE ARTHROPLASTY Right 02/04/2015   Procedure: UNICOMPARTMENTAL KNEE;  Surgeon: Dorna Leitz, MD;  Location: [facility];  Service: Orthopedics;  Laterality: Right;  . PERIPHERAL VASCULAR CATHETERIZATION Bilateral 05/13/2015   Procedure: Lower Extremity Angiography;  Surgeon: Lorretta Harp, MD;  Location: [facility];  Service: Cardiovascular;  Laterality: Bilateral;  . PERIPHERAL VASCULAR CATHETERIZATION N/A 05/13/2015   Procedure: Abdominal Aortogram;  Surgeon: Lorretta Harp, MD;  Location: [facility];  Service: Cardiovascular;  Laterality: N/A;  . PERIPHERAL VASCULAR CATHETERIZATION Bilateral 06/27/2015   Procedure: Peripheral Vascular Intervention;  Surgeon: Lorretta Harp, MD;  Location: [facility];  Service: Cardiovascular;  Laterality: Bilateral;  ILIACS  . PERIPHERAL VASCULAR CATHETERIZATION Bilateral 06/27/2015   Procedure: Peripheral Vascular Atherectomy;  Surgeon: Lorretta Harp, MD;  Location: [facility];  Service: Cardiovascular;  Laterality: Bilateral;  . TEE WITHOUT CARDIOVERSION N/A 01/24/2016   Procedure: TRANSESOPHAGEAL ECHOCARDIOGRAM (TEE);  Surgeon: Gaye Pollack, MD;  Location: [facility];  Service: Open Heart Surgery;  Laterality: N/A;  . TONSILLECTOMY     age 34  . TUBAL LIGATION       OB History    Gravida  4   Para  4   Term  4   Preterm      AB      Living  4     SAB      TAB      Ectopic      Multiple       Live Births               Home Medications    Prior to Admission medications   Medication Sig Start Date End Date Taking? Authorizing Provider  aspirin EC 81 MG tablet Take 81 mg by mouth every evening.     [provider]  atorvastatin (LIPITOR) 40 MG tablet TAKE (1) TABLET BY MOUTH ONCE DAILY. Patient taking differently: Take 40 mg by mouth every evening.  05/09/18   Burtis Junes, NP  clopidogrel (PLAVIX) 75 MG tablet TAKE (1) TABLET BY MOUTH ONCE DAILY. Patient taking differently: Take 75 mg by mouth every morning.  08/03/18   Burtis Junes, NP  HYDROcodone-acetaminophen (NORCO/VICODIN) 5-325 MG tablet Take 1 tablet by mouth 2 (two) times daily as needed for moderate pain or severe pain.  04/16/17   [provider]  levothyroxine (SYNTHROID, LEVOTHROID) 50 MCG tablet Take 50 mcg by mouth  daily before breakfast.    [provider]  metoprolol tartrate (LOPRESSOR) 25 MG tablet Take 1 tablet (25 mg total) by mouth 2 (two) times daily. Pt needs to make appt in June to get further refills - 1st attempt 04/13/19   Burtis Junes, NP  nitroGLYCERIN (NITROSTAT) 0.4 MG SL tablet Place 1 tablet (0.4 mg total) under the tongue every 5 (five) minutes as needed for chest pain. 05/13/18   Jerline Pain, MD  ondansetron (ZOFRAN ODT) 4 MG disintegrating tablet Take 1 tablet (4 mg total) by mouth every 8 (eight) hours as needed for nausea or vomiting. 11/23/18   Little, Wenda Overland, MD  oxyCODONE-acetaminophen (PERCOCET) 5-325 MG tablet Take 1 tablet by mouth every 4 (four) hours as needed. 11/23/18   Little, Wenda Overland, MD  pantoprazole (PROTONIX) 40 MG tablet Take 40 mg by mouth 2 (two) times daily.  05/17/17   [provider]    Family History Family History  Problem Relation Age of Onset  . Cancer Mother 48  . Cancer Father 63  . Hypertension Maternal Grandmother   . Stroke Maternal Grandfather   . Hypertension Son   . Heart attack Neg Hx      Social History Social History   Tobacco Use  . Smoking status: Former Smoker    Packs/day: 1.50    Years: 42.00    Pack years: 63.00    Types: Cigarettes    Quit date: 05/19/1999    Years since quitting: 20.1  . Smokeless tobacco: Never Used  Substance Use Topics  . Alcohol use: No  . Drug use: No     Allergies   Shrimp [shellfish allergy]   Review of Systems Review of Systems  All other systems reviewed and are negative.    Physical Exam Updated Vital Signs BP (!) 130/55   Pulse 81   Temp 98.3 F (36.8 C) (Oral)   Resp (!) 21   Ht 5\' 3"  (1.6 m)   Wt 68.9 kg   SpO2 95%   BMI 26.93 kg/m   Physical Exam Vitals signs and nursing note reviewed.  Constitutional:      Appearance: She is well-developed.  HENT:     Head: Normocephalic and atraumatic.     Nose: No congestion or rhinorrhea.     Mouth/Throat:     Mouth: Mucous membranes are dry.     Pharynx: Oropharynx is clear.  Eyes:     Conjunctiva/sclera: Conjunctivae normal.     Comments: Conjunctival pallor  Neck:     Musculoskeletal: Normal range of motion.  Cardiovascular:     Rate and Rhythm: Normal rate and regular rhythm.  Pulmonary:     Effort: No respiratory distress.     Breath sounds: No stridor.  Abdominal:     General: There is no distension.  Musculoskeletal: Normal range of motion.        General: No swelling, tenderness, deformity or signs of injury.  Skin:    General: Skin is warm and dry.  Neurological:     General: No focal deficit present.     Mental Status: She is alert.      ED Treatments / Results  Labs (all labs ordered are listed, but only abnormal results are displayed) Labs Reviewed  COMPREHENSIVE METABOLIC PANEL - Abnormal; Notable for the following components:      Result Value   Glucose, Bld 112 (*)    Creatinine, Ser 1.59 (*)    Total Protein 6.4 (*)  Albumin 3.4 (*)    AST 14 (*)    GFR calc non Af Amer 30 (*)    GFR calc Af Amer 35 (*)    All other  components within normal limits  CBC - Abnormal; Notable for the following components:   RBC 3.66 (*)    Hemoglobin 7.0 (*)    HCT 26.5 (*)    MCV 72.4 (*)    MCH 19.1 (*)    MCHC 26.4 (*)    RDW 19.2 (*)    All other components within normal limits  POC OCCULT BLOOD, ED - Abnormal; Notable for the following components:   Fecal Occult Bld POSITIVE (*)    All other components within normal limits  OCCULT BLOOD X 1 CARD TO LAB, STOOL  URINALYSIS, ROUTINE W REFLEX MICROSCOPIC  TYPE AND SCREEN  PREPARE RBC (CROSSMATCH)    EKG None  Radiology No results found.  Procedures Procedures (including critical care time)  CRITICAL CARE Performed by: Merrily Pew Total critical care time: 35 minutes Critical care time was exclusive of separately billable procedures and treating other patients. Critical care was necessary to treat or prevent imminent or life-threatening deterioration. Critical care was time spent personally by me on the following activities: development of treatment plan with patient and/or surrogate as well as nursing, discussions with consultants, evaluation of patient's response to treatment, examination of patient, obtaining history from patient or surrogate, ordering and performing treatments and interventions, ordering and review of laboratory studies, ordering and review of radiographic studies, pulse oximetry and re-evaluation of patient's condition.   Medications Ordered in ED Medications  0.9 %  sodium chloride infusion (Manually program via Guardrails IV Fluids) ( Intravenous Stopped 06/22/19 0535)     Initial Impression / Assessment and Plan / ED Course  I have reviewed the triage vital signs and the nursing notes.  Pertinent labs & imaging results that were available during my care of the patient were reviewed by me and considered in my medical decision making (see chart for details).  Anemic likely 2/2 chronic GI bleed (positive hemoccult). Secondary to  ED holding for admitted patients, risk of contracting Covid in hospital, the patient and I decided that it would be best to see if GI could see in office soon.   Discussed with Dr. Silverio Decamp, who will have office call her for quick follow up as long a she is stable. Patient persistently normotensive and not tachycardic. Improved symptoms with transfusion. Her and daughter ok with plan. Dc in stable condition.     Final Clinical Impressions(s) / ED Diagnoses   Final diagnoses:  Symptomatic anemia  Gastrointestinal hemorrhage, unspecified gastrointestinal hemorrhage type    ED Discharge Orders    None       Moustafa Mossa, Corene Cornea, MD 06/22/19 [phone]

## 2019-06-23 LAB — TYPE AND SCREEN
ABO/RH(D): O POS
Antibody Screen: NEGATIVE
Unit division: 0
Unit division: 0

## 2019-06-23 LAB — BPAM RBC
Blood Product Expiration Date: 202008252359
Blood Product Expiration Date: 202008252359
ISSUE DATE / TIME: 202007292345
ISSUE DATE / TIME: 202007300224
Unit Type and Rh: 5100
Unit Type and Rh: 5100

## 2019-06-27 ENCOUNTER — Other Ambulatory Visit: Payer: Self-pay | Admitting: Nurse Practitioner

## 2019-07-03 ENCOUNTER — Other Ambulatory Visit: Payer: Self-pay | Admitting: Nurse Practitioner

## 2019-07-21 ENCOUNTER — Encounter: Payer: Self-pay | Admitting: Gastroenterology

## 2019-07-21 ENCOUNTER — Other Ambulatory Visit: Payer: Self-pay | Admitting: Nurse Practitioner

## 2019-07-21 ENCOUNTER — Other Ambulatory Visit (INDEPENDENT_AMBULATORY_CARE_PROVIDER_SITE_OTHER): Payer: Medicare Other

## 2019-07-21 ENCOUNTER — Telehealth: Payer: Self-pay | Admitting: Gastroenterology

## 2019-07-21 ENCOUNTER — Ambulatory Visit (INDEPENDENT_AMBULATORY_CARE_PROVIDER_SITE_OTHER): Payer: Medicare Other | Admitting: Gastroenterology

## 2019-07-21 ENCOUNTER — Telehealth: Payer: Self-pay

## 2019-07-21 VITALS — BP 126/80 | HR 65 | Temp 98.2°F | Ht 63.0 in | Wt 150.0 lb

## 2019-07-21 DIAGNOSIS — K625 Hemorrhage of anus and rectum: Secondary | ICD-10-CM

## 2019-07-21 DIAGNOSIS — K219 Gastro-esophageal reflux disease without esophagitis: Secondary | ICD-10-CM | POA: Diagnosis not present

## 2019-07-21 DIAGNOSIS — R1030 Lower abdominal pain, unspecified: Secondary | ICD-10-CM

## 2019-07-21 DIAGNOSIS — R935 Abnormal findings on diagnostic imaging of other abdominal regions, including retroperitoneum: Secondary | ICD-10-CM

## 2019-07-21 DIAGNOSIS — R630 Anorexia: Secondary | ICD-10-CM

## 2019-07-21 DIAGNOSIS — D509 Iron deficiency anemia, unspecified: Secondary | ICD-10-CM

## 2019-07-21 LAB — PROTIME-INR
INR: 1.1 ratio — ABNORMAL HIGH (ref 0.8–1.0)
Prothrombin Time: 13.4 s — ABNORMAL HIGH (ref 9.6–13.1)

## 2019-07-21 LAB — CBC
HCT: 32.1 % — ABNORMAL LOW (ref 36.0–46.0)
Hemoglobin: 9.7 g/dL — ABNORMAL LOW (ref 12.0–15.0)
MCHC: 30.3 g/dL (ref 30.0–36.0)
MCV: 72.7 fl — ABNORMAL LOW (ref 78.0–100.0)
Platelets: 231 10*3/uL (ref 150.0–400.0)
RBC: 4.41 Mil/uL (ref 3.87–5.11)
RDW: 23.7 % — ABNORMAL HIGH (ref 11.5–15.5)
WBC: 7.3 10*3/uL (ref 4.0–10.5)

## 2019-07-21 LAB — VITAMIN B12: Vitamin B-12: 240 pg/mL (ref 211–911)

## 2019-07-21 LAB — COMPREHENSIVE METABOLIC PANEL
ALT: 8 U/L (ref 0–35)
AST: 15 U/L (ref 0–37)
Albumin: 4.1 g/dL (ref 3.5–5.2)
Alkaline Phosphatase: 103 U/L (ref 39–117)
BUN: 19 mg/dL (ref 6–23)
CO2: 27 mEq/L (ref 19–32)
Calcium: 9.8 mg/dL (ref 8.4–10.5)
Chloride: 104 mEq/L (ref 96–112)
Creatinine, Ser: 1.43 mg/dL — ABNORMAL HIGH (ref 0.40–1.20)
GFR: 35.27 mL/min — ABNORMAL LOW (ref >60.00)
Glucose, Bld: 100 mg/dL — ABNORMAL HIGH (ref 70–99)
Potassium: 3.7 mEq/L (ref 3.5–5.1)
Sodium: 140 mEq/L (ref 135–145)
Total Bilirubin: 0.6 mg/dL (ref 0.2–1.2)
Total Protein: 7.1 g/dL (ref 6.0–8.3)

## 2019-07-21 LAB — IBC + FERRITIN
Ferritin: 7.3 ng/mL — ABNORMAL LOW (ref 10.0–291.0)
Iron: 13 ug/dL — ABNORMAL LOW (ref 42–145)
Saturation Ratios: 3.2 % — ABNORMAL LOW (ref 20.0–50.0)
Transferrin: 288 mg/dL (ref 212.0–360.0)

## 2019-07-21 LAB — IGA: IgA: 82 mg/dL (ref 68–378)

## 2019-07-21 MED ORDER — PEG 3350-KCL-NA BICARB-NACL 420 G PO SOLR: 4000.0000 mL | Freq: Once | ORAL | 0 refills | Status: DC

## 2019-07-21 MED ORDER — SUPREP BOWEL PREP KIT 17.5-3.13-1.6 GM/177ML PO SOLN: 1.0000 | ORAL | 0 refills | Status: DC

## 2019-07-21 NOTE — Telephone Encounter (Addendum)
Pt will let me know if she has diarrhea over the weekend. Per her discussion with Dr Rush Landmark if she has diarrhea over the weekend then she will require stools studies. I will check back with pt on Monday.

## 2019-07-21 NOTE — Telephone Encounter (Signed)
Request for surgical clearance:     Endoscopy Procedure  What type of surgery is being performed?     Colonoscopy/Endoscopy   When is this surgery scheduled?     08/03/2019  What type of clearance is required ?   Pharmacy  Are there any medications that need to be held prior to surgery and how long? Plavix x 5 days prior to procedure  Practice name and name of physician performing surgery?      [facility]  What is your office phone and fax number?      Phone- [phone]  Fax- [phone] Attn: Helmut Muster   Anesthesia type (None, local, MAC, general) ?       MAC

## 2019-07-21 NOTE — Patient Instructions (Signed)
Your provider has requested that you go to the basement level for lab work before leaving today. Press "B" on the elevator. The lab is located at the first door on the left as you exit the elevator.  You have been scheduled for an endoscopy and colonoscopy. Please follow the written instructions given to you at your visit today. Please pick up your prep supplies at the pharmacy within the next 1-3 days.   We will obtain clearance from Dr.Skains office for you to hold your plavix for 5 days prior to your procedure. We will contact you once we have this information.   Thank you for choosing me and [facility].  Dr. Rush Landmark

## 2019-07-21 NOTE — Telephone Encounter (Signed)
Pls call pt, she needs clarification regarding stool test, she was told that she needs to get the kit from the lab but the lab told her that there is no order for that. °

## 2019-07-22 NOTE — Telephone Encounter (Signed)
  OK to proceed with endoscopy  OK to hold Plavix 5 days prior to procedure  Please resume post procedure when safe from bleeding perspective.   Candee Furbish, MD

## 2019-07-23 ENCOUNTER — Encounter: Payer: Self-pay | Admitting: Gastroenterology

## 2019-07-23 DIAGNOSIS — R1030 Lower abdominal pain, unspecified: Secondary | ICD-10-CM | POA: Insufficient documentation

## 2019-07-23 DIAGNOSIS — R935 Abnormal findings on diagnostic imaging of other abdominal regions, including retroperitoneum: Secondary | ICD-10-CM | POA: Insufficient documentation

## 2019-07-23 DIAGNOSIS — K625 Hemorrhage of anus and rectum: Secondary | ICD-10-CM | POA: Insufficient documentation

## 2019-07-23 DIAGNOSIS — R630 Anorexia: Secondary | ICD-10-CM | POA: Insufficient documentation

## 2019-07-23 DIAGNOSIS — D509 Iron deficiency anemia, unspecified: Secondary | ICD-10-CM | POA: Insufficient documentation

## 2019-07-23 NOTE — Progress Notes (Signed)
[place] VISIT   Primary Care Provider Antony Contras, MD [address] [place] [address] [phone]  Referring Provider Antony Contras, MD [address] [place],  [place] [address] [phone]  Patient Profile: Jessica Taylor is a 78 y.o. female with a pmh significant for AAA, GERD, arthritis, BCCs of the skin, CAD, hypertension, hyperlipidemia, hypothyroidism, peripheral arterial disease (on Plavix), vertigo.  The patient presents to the [facility] for an evaluation and management of problem(s) noted below:  Problem List 1. Microcytic anemia   2. Gastroesophageal reflux disease, esophagitis presence not specified   3. BRBPR (bright red blood per rectum)   4. Anorexia   5. Lower abdominal pain   6. Abnormal CT of the abdomen     History of Present Illness This is the patient's first visit to the outpatient [place] clinic she is referred for further evaluation of anemia of unclear etiology.  She was recently in the emergency department and received blood and was discharged.  She recalls 2 episodes within the last couple of years that have required blood.  She has never seen any GI blood loss including melena or hematochezia until the day prior to today's visit.  She had new onset diarrhea with blood yesterday.  However prior to this she again states she has never seen any dark stool that has been black or melanic or maroon or red.  Normally she has soft brown bowel movement every other day and that has been her bowel habits for years.  6 weeks of some very intermittent lower abdominal discomfort has occurred.  She is describing it as a dull in nature.  Postprandial after a couple of hours is when the pain comes about but it is not always associated with food intake.  She has noted a slight decreased appetite over the course of the last few months.  She may have lost approximately 2 to 3 pounds  unintentionally but she does not feel that at is a significant amount of weight loss for her based on her current body mass.  She has had GERD symptoms for years and takes Nexium.  As long as she takes her Nexium she has no breakthrough symptoms and denies any dysphagia or odynophagia.  She is not on any iron supplementation and reports not having had prior work-up for anemia for years.  She has never had an upper or lower endoscopy.  She does not have menorrhagia.  GI Review of Systems Positive as above Negative for nausea, vomiting, early satiety  Review of Systems General: Denies fevers/chills HEENT: Denies oral lesions Cardiovascular: Denies chest pain/palpitations Pulmonary: Denies shortness of breath/cough Gastroenterological: See HPI Genitourinary: Denies darkened urine or hematuria Hematological: Positive for easy bruising secondary to Plavix Endocrine: Denies temperature intolerance Dermatological: Denies jaundice Psychological: Mood is stable   Medications Current Outpatient Medications  Medication Sig Dispense Refill   aspirin EC 81 MG tablet Take 81 mg by mouth every evening.      atorvastatin (LIPITOR) 40 MG tablet TAKE (1) TABLET BY MOUTH ONCE DAILY. 90 tablet 0   clopidogrel (PLAVIX) 75 MG tablet TAKE (1) TABLET BY MOUTH ONCE DAILY. 30 tablet 0   HYDROcodone-acetaminophen (NORCO/VICODIN) 5-325 MG tablet Take 1 tablet by mouth 2 (two) times daily as needed for moderate pain or severe pain.      levothyroxine (SYNTHROID, LEVOTHROID) 50 MCG tablet Take 50 mcg by mouth daily before breakfast.     metoprolol tartrate (  LOPRESSOR) 25 MG tablet Take 1 tablet (25 mg total) by mouth 2 (two) times daily. Pt needs to make appt in June to get further refills - 1st attempt 180 tablet 0   nitroGLYCERIN (NITROSTAT) 0.4 MG SL tablet Place 1 tablet (0.4 mg total) under the tongue every 5 (five) minutes as needed for chest pain. 25 tablet prn   ondansetron (ZOFRAN ODT) 4 MG  disintegrating tablet Take 1 tablet (4 mg total) by mouth every 8 (eight) hours as needed for nausea or vomiting. 8 tablet 0   oxyCODONE-acetaminophen (PERCOCET) 5-325 MG tablet Take 1 tablet by mouth every 4 (four) hours as needed. 20 tablet 0   pantoprazole (PROTONIX) 40 MG tablet Take 40 mg by mouth 2 (two) times daily.      SUPREP BOWEL PREP KIT 17.5-3.13-1.6 GM/177ML SOLN Take 1 kit by mouth as directed. For colonoscopy prep 354 mL 0   No current facility-administered medications for this visit.     Allergies Allergies  Allergen Reactions   Shrimp [Shellfish Allergy] Nausea And Vomiting    Throws up violently    Histories Past Medical History:  Diagnosis Date   Abdominal aortic aneurysm (HCC)    Acid reflux    Anemia    Arthritis    knees , R shoulder - tx /w injection - 11/2014   Basal cell carcinoma (BCC) of dorsum of nose 2010   Resolved   Cancer ([facility])    basal cell on nose   Coronary artery disease    Hypercholesteremia    Hypertension    Hypothyroidism    MI, old 2000   Peripheral arterial disease ([facility])    hhigh-grade ostial bilateral calcified iliac stenosis with claudication   Tobacco abuse    Vertigo    when lays on left side.   Past Surgical History:  Procedure Laterality Date   CARDIAC CATHETERIZATION  5 stents   CARDIAC CATHETERIZATION N/A 01/20/2016   Procedure: Left Heart Cath and Coronary Angiography;  Surgeon: Peter M Martinique, MD;  Location: [facility];  Service: Cardiovascular;  Laterality: N/A;   CATARACT EXTRACTION W/PHACO Left 05/24/2014   Procedure: CATARACT EXTRACTION PHACO AND INTRAOCULAR LENS PLACEMENT (IOC);  Surgeon: Tonny Branch, MD;  Location: [facility];  Service: Ophthalmology;  Laterality: Left;  CDE:  9.30   CATARACT EXTRACTION W/PHACO Right 06/18/2014   Procedure: CATARACT EXTRACTION PHACO AND INTRAOCULAR LENS PLACEMENT RIGHT EYE CDE=10.84;  Surgeon: Tonny Branch, MD;  Location: [facility];  Service: Ophthalmology;   Laterality: Right;   CORONARY ARTERY BYPASS GRAFT N/A 01/24/2016   Procedure: CORONARY ARTERY BYPASS GRAFTING (CABG);  Surgeon: Gaye Pollack, MD;  Location: [facility];  Service: Open Heart Surgery;  Laterality: N/A;   CORONARY STENT PLACEMENT     CORONARY STENT PLACEMENT  03/06/15   CFX DES   CYSTOSCOPY/URETEROSCOPY/HOLMIUM LASER/STENT PLACEMENT Left 09/12/2018   Procedure: CYSTOSCOPY/URETEROSCOPY/STENT PLACEMENT;  Surgeon: Ceasar Mons, MD;  Location: [facility];  Service: Urology;  Laterality: Left;   EYE SURGERY     LEFT HEART CATHETERIZATION WITH CORONARY ANGIOGRAM N/A 03/06/2015   Procedure: LEFT HEART CATHETERIZATION WITH CORONARY ANGIOGRAM;  Surgeon: Lorretta Harp, MD;  Location: [facility];  Service: Cardiovascular;  Laterality: N/A;   PARTIAL KNEE ARTHROPLASTY Right 02/04/2015   Procedure: UNICOMPARTMENTAL KNEE;  Surgeon: Dorna Leitz, MD;  Location: [facility];  Service: Orthopedics;  Laterality: Right;   PERIPHERAL VASCULAR CATHETERIZATION Bilateral 05/13/2015   Procedure: Lower Extremity Angiography;  Surgeon: Lorretta Harp, MD;  Location: [facility];  Service: Cardiovascular;  Laterality: Bilateral;   PERIPHERAL VASCULAR CATHETERIZATION N/A 05/13/2015   Procedure: Abdominal Aortogram;  Surgeon: Lorretta Harp, MD;  Location: [facility];  Service: Cardiovascular;  Laterality: N/A;   PERIPHERAL VASCULAR CATHETERIZATION Bilateral 06/27/2015   Procedure: Peripheral Vascular Intervention;  Surgeon: Lorretta Harp, MD;  Location: [facility];  Service: Cardiovascular;  Laterality: Bilateral;  ILIACS   PERIPHERAL VASCULAR CATHETERIZATION Bilateral 06/27/2015   Procedure: Peripheral Vascular Atherectomy;  Surgeon: Lorretta Harp, MD;  Location: [facility];  Service: Cardiovascular;  Laterality: Bilateral;   TEE WITHOUT CARDIOVERSION N/A 01/24/2016   Procedure: TRANSESOPHAGEAL ECHOCARDIOGRAM (TEE);  Surgeon: Gaye Pollack, MD;  Location: [facility];   Service: Open Heart Surgery;  Laterality: N/A;   TONSILLECTOMY     age 79   TUBAL LIGATION     Social History   Socioeconomic History   Marital status: Married    Spouse name: Not on file   Number of children: 4   Years of education: Not on file   Highest education level: Not on file  Occupational History   Occupation: retired    Comment: worked as Presenter, broadcasting for [employer] strain: Not hard at [employer] insecurity    Worry: Never true    Inability: Never true   Transportation needs    Medical: No    Non-medical: No  Tobacco Use   Smoking status: Former Smoker    Packs/day: 1.50    Years: 42.00    Pack years: 63.00    Types: Cigarettes    Quit date: 05/19/1999    Years since quitting: 20.1   Smokeless tobacco: Never Used  Substance and Sexual Activity   Alcohol use: No   Drug use: No   Sexual activity: Yes    Birth control/protection: Surgical  Lifestyle   Physical activity    Days per week: Patient refused    Minutes per session: Patient refused   Stress: Only a little  Relationships   Social connections    Talks on phone: More than three times a week    Gets together: More than three times a week    Attends religious service: More than 4 times per year    Active member of club or organization: No    Attends meetings of clubs or organizations: Never    Relationship status: Married   Intimate partner violence    Fear of current or ex partner: No    Emotionally abused: No    Physically abused: No    Forced sexual activity: No  Other Topics Concern   Not on file  Social History Narrative   Not on file   Family History  Problem Relation Age of Onset   Ovarian cancer Mother 52   Cancer Father 32       unsure of which kind, "it was in his glands"   Hypertension Maternal Grandmother    Stroke Maternal Grandfather    Hypertension Son    Heart attack Neg Hx    Colon cancer Neg Hx      Esophageal cancer Neg Hx    Inflammatory bowel disease Neg Hx    Liver disease Neg Hx    Pancreatic cancer Neg Hx    Rectal cancer Neg Hx    Stomach cancer Neg Hx    I have reviewed her medical, social, and family history  in detail and updated the electronic medical record as necessary.    PHYSICAL EXAMINATION  BP 126/80    Pulse 65    Temp 98.2 F (36.8 C)    Ht 5' 3"  (1.6 m)    Wt 150 lb (68 kg)    BMI 26.57 kg/m  Wt Readings from Last 3 Encounters:  07/21/19 150 lb (68 kg)  06/21/19 152 lb (68.9 kg)  11/23/18 157 lb (71.2 kg)  GEN: NAD, appears stated age, doesn't appear chronically ill PSYCH: Cooperative, without pressured speech EYE: Conjunctivae pink, sclerae anicteric ENT: MMM, without oral ulcers, no erythema or exudates noted NECK: Supple CV: RR without R/Gs  RESP: CTAB posteriorly, without wheezing GI: NABS, soft, NT/ND, without rebound or guarding, no HSM appreciated GU: DRE shows evidence of internal hemorrhoids that prolapsed partially, no palpable rectal lesions in the vault, normal tone, brown stool in vault MSK/EXT: Trace bilateral lower extremity edema SKIN: No jaundice NEURO:  Alert & Oriented x 3, no focal deficits   REVIEW OF DATA  I reviewed the following data at the time of this encounter:  GI Procedures and Studies  No relevant studies to review  Laboratory Studies  Reviewed those in epic  Imaging Studies  January 2020 CT renal study FINDINGS: Lower chest: Minimal atelectasis of posterior lung bases are noted. Hepatobiliary: 10 mm cyst is identified in the anterior liver unchanged compared to prior exam. The liver is otherwise unremarkable. Gallbladder is normal. The biliary tree is normal. Pancreas: Unremarkable. No pancreatic ductal dilatation or surrounding inflammatory changes. Spleen: Normal in size without focal abnormality. Adrenals/Urinary Tract: The right kidney is slightly atrophic without hydronephrosis. There is left  perinephric stranding with left hydronephrosis. There is contrast in the left collecting system. No stone is identified in the left collecting system. The bladder is decompressed limiting evaluation. Stomach/Bowel: There is mild stranding and inflammation surrounding the cecum. The appendix is normal. There is diverticulosis of colon. There is no small bowel obstruction. There is a small hiatal hernia. Vascular/Lymphatic: There is atherosclerosis of the abdominal aorta with infrarenal dilatation of the aorta measuring 3.4 cm. No abdominal or pelvic lymphadenopathy is identified. Reproductive: Uterus and bilateral adnexa are unremarkable. Other: None. Musculoskeletal: Degenerative joint changes of the spine are noted. IMPRESSION: Left perinephric stranding with left hydronephrosis. No definite stone is identified in the left collecting system. There is contrast in the left collecting system. Clinical correlation regarding recent intravenous contrast administration is suggested. The findings are nonspecific. Differential diagnosis includes recently passed stone or pyelonephritis.   ASSESSMENT  Ms. Schwimmer is a 78 y.o. female with a pmh significant for AAA, GERD, arthritis, BCCs of the skin, CAD, hypertension, hyperlipidemia, hypothyroidism, peripheral arterial disease (on Plavix), vertigo.  The patient is seen today for evaluation and management of:  1. Microcytic anemia   2. Gastroesophageal reflux disease, esophagitis presence not specified   3. BRBPR (bright red blood per rectum)   4. Anorexia   5. Lower abdominal pain   6. Abnormal CT of the abdomen    The patient is hemodynamically stable.  She has evidence of microcytic anemia of an unclear etiology.  Until yesterday she had never manifested GI blood loss.  Yesterday's blood passage was in the setting of diarrhea but today she has not had any blood passage.  The patient's clinical history of heartburn symptoms although no overt  dysphagia and her decreased appetite as well as her microcytic anemia require both an upper and lower endoscopic evaluation.  Were going to send off laboratories to evaluate for iron deficiency as well as other micronutrient deficiencies.  I will likely initiate the patient on iron and consider IV iron supplementation as well depending on the results.  If the patient continues to have bloody diarrhea into next week she will bring stool samples to rule out infectious etiologies if things subside then she does not need to bring those stool samples prior to her upper and lower endoscopy.  Although the final report of the CT scan from January of this year it did not any issues in the colon the report itself suggests that there was some cecal thickening as well as diverticulosis without diverticulitis.  The risks and benefits of endoscopic evaluation were discussed with the patient; these include but are not limited to the risk of perforation, infection, bleeding, missed lesions, lack of diagnosis, severe illness requiring hospitalization, as well as anesthesia and sedation related illnesses.  The patient is agreeable to proceed.  All patient questions were answered, to the best of my ability, and the patient agrees to the aforementioned plan of action with follow-up as indicated.   PLAN  Laboratories as outlined below Proceed with scheduling diagnostic upper/lower endoscopy for evaluation of microcytic anemia (esophageal/gastric/duodenal biopsies to be obtained at minimum) If evidence of only hemorrhoidal disease as etiology will consider role of banding however it would be weird in the setting of not having a chronic history of bleeding that this would be a source and would have to consider the role of an enteroscopy and capsule endoscopy after iron supplementation if it is found to be the issue Stool studies to be completed if patient continues to have bloody diarrhea into next week otherwise they can be put on  hold (she will call to let us know) Okay to use preparation H ointment or cream in the interim issues   Orders Placed This Encounter  Procedures   CBC   Comp Met (CMET)   IBC + Ferritin   Reticulocytes   Haptoglobin   B12   IgA   Tissue transglutaminase, IgA   INR/PT   Hepatitis C Antibody   Ambulatory referral to Gastroenterology    New Prescriptions   SUPREP BOWEL PREP KIT 17.5-3.13-1.6 GM/177ML SOLN    Take 1 kit by mouth as directed. For colonoscopy prep   Modified Medications   No medications on file    Planned Follow Up No follow-ups on file.   Justice Britain, MD [facility] Office # [phone]

## 2019-07-24 LAB — HEPATITIS C ANTIBODY
Hepatitis C Ab: NONREACTIVE
SIGNAL TO CUT-OFF: 0.01 (ref <1.00)

## 2019-07-24 LAB — TISSUE TRANSGLUTAMINASE, IGA: (tTG) Ab, IgA: 1 U/mL

## 2019-07-24 LAB — RETICULOCYTES
ABS Retic: 60340 cells/uL (ref 20000–8000)
Retic Ct Pct: 1.4 %

## 2019-07-24 NOTE — Telephone Encounter (Signed)
Thank you for update. GM 

## 2019-07-24 NOTE — Telephone Encounter (Signed)
Called and left message asking for return call.  °

## 2019-07-26 ENCOUNTER — Encounter: Payer: Self-pay | Admitting: Gastroenterology

## 2019-07-26 ENCOUNTER — Other Ambulatory Visit: Payer: Self-pay

## 2019-07-26 DIAGNOSIS — D509 Iron deficiency anemia, unspecified: Secondary | ICD-10-CM

## 2019-07-26 MED ORDER — FERROUS GLUCONATE 324 (38 FE) MG PO TABS: 324.0000 mg | ORAL_TABLET | Freq: Every day | ORAL | 0 refills | Status: DC

## 2019-07-27 ENCOUNTER — Other Ambulatory Visit: Payer: Self-pay | Admitting: Nurse Practitioner

## 2019-08-02 ENCOUNTER — Telehealth: Payer: Self-pay | Admitting: Gastroenterology

## 2019-08-02 ENCOUNTER — Ambulatory Visit (HOSPITAL_COMMUNITY)
Admission: RE | Admit: 2019-08-02 | Discharge: 2019-08-02 | Disposition: A | Discharge status: Home / Self Care | Payer: Medicare Other | Source: Ambulatory Visit | Attending: Gastroenterology | Admitting: Gastroenterology

## 2019-08-02 ENCOUNTER — Telehealth: Payer: Self-pay

## 2019-08-02 DIAGNOSIS — D509 Iron deficiency anemia, unspecified: Secondary | ICD-10-CM | POA: Insufficient documentation

## 2019-08-02 MED ORDER — SODIUM CHLORIDE 0.9 % IV SOLN
510.0000 mg | INTRAVENOUS | Status: DC
  Administered 2019-08-02: 510 mg via INTRAVENOUS
  Filled 2019-08-02: qty 17

## 2019-08-02 MED ORDER — SODIUM CHLORIDE 0.9 % IV SOLN
INTRAVENOUS | Status: DC | PRN
  Administered 2019-08-02: 10:00:00 250 mL via INTRAVENOUS

## 2019-08-02 NOTE — Telephone Encounter (Signed)
Patient's chart reviewed at end of day.  Looks like patient was able to tolerate the rest of her infusion.  She had not taken her blood pressure medicines.  All future IV iron infusions she needs to take her blood pressure medications to minimize risk of having to stop or not have infusion performed. I will see her for her upcoming procedures.

## 2019-08-02 NOTE — Telephone Encounter (Signed)
[facility] calling to advise that pt's BP increase to 142/64 during iron infusion. They want to know if Dr. Rush Landmark wants them to keep administering infusion. Pls call them asap.

## 2019-08-02 NOTE — Progress Notes (Signed)
Patient received Ferahe[facility] via PIV. Pre infusion BP was 142/64. Patient stated, she did not take her BP medications before coming today.  Mansouraty G. MD's office contacted. Per MD, RN should infuse. Observed for at least 30 minutes post infusion. Post infusion BP was159/66. MD's office contacted again. RN spoke with Patty F. Patient can be discharged. Patient said she will go back home and take BP medications as prescribed. Tolerated well, discharge instructions given, verbalized understanding. Patient alert, oriented and ambulatory at the time of discharge.

## 2019-08-02 NOTE — Telephone Encounter (Signed)
Per V.O.  Dr Rush Landmark states ok to continue Iron infusion unless BP increases to 160's or 170's, SOB or chest pain.  If she develops other symptoms they are advised to call for further recommendations.  The pt has been advised of the information and verbalized understanding.

## 2019-08-02 NOTE — Telephone Encounter (Signed)
Covid-19 screening questions   Do you now or have you had a fever in the last 14 days?  Do you have any respiratory symptoms of shortness of breath or cough now or in the last 14 days?  Do you have any family members or close contacts with diagnosed or suspected Covid-19 in the past 14 days?  Have you been tested for Covid-19 and found to be positive?       

## 2019-08-02 NOTE — Discharge Instructions (Signed)

## 2019-08-02 NOTE — Telephone Encounter (Signed)
Patient returned call and answered NO to all the Covid-19 screening questions

## 2019-08-03 ENCOUNTER — Other Ambulatory Visit: Payer: Self-pay | Admitting: Gastroenterology

## 2019-08-03 ENCOUNTER — Other Ambulatory Visit: Payer: Self-pay

## 2019-08-03 ENCOUNTER — Ambulatory Visit (AMBULATORY_SURGERY_CENTER): Payer: Medicare Other | Admitting: Gastroenterology

## 2019-08-03 ENCOUNTER — Encounter: Payer: Self-pay | Admitting: Gastroenterology

## 2019-08-03 VITALS — BP 158/81 | HR 64 | Temp 98.0°F | Resp 14 | Ht 63.0 in | Wt 150.0 lb

## 2019-08-03 DIAGNOSIS — K219 Gastro-esophageal reflux disease without esophagitis: Secondary | ICD-10-CM

## 2019-08-03 DIAGNOSIS — R630 Anorexia: Secondary | ICD-10-CM

## 2019-08-03 DIAGNOSIS — C183 Malignant neoplasm of hepatic flexure: Secondary | ICD-10-CM | POA: Diagnosis not present

## 2019-08-03 DIAGNOSIS — D128 Benign neoplasm of rectum: Secondary | ICD-10-CM

## 2019-08-03 DIAGNOSIS — K222 Esophageal obstruction: Secondary | ICD-10-CM | POA: Diagnosis not present

## 2019-08-03 DIAGNOSIS — K625 Hemorrhage of anus and rectum: Secondary | ICD-10-CM

## 2019-08-03 DIAGNOSIS — D509 Iron deficiency anemia, unspecified: Secondary | ICD-10-CM

## 2019-08-03 DIAGNOSIS — C182 Malignant neoplasm of ascending colon: Secondary | ICD-10-CM

## 2019-08-03 DIAGNOSIS — R935 Abnormal findings on diagnostic imaging of other abdominal regions, including retroperitoneum: Secondary | ICD-10-CM

## 2019-08-03 DIAGNOSIS — K644 Residual hemorrhoidal skin tags: Secondary | ICD-10-CM

## 2019-08-03 DIAGNOSIS — K449 Diaphragmatic hernia without obstruction or gangrene: Secondary | ICD-10-CM | POA: Diagnosis not present

## 2019-08-03 DIAGNOSIS — D129 Benign neoplasm of anus and anal canal: Secondary | ICD-10-CM

## 2019-08-03 DIAGNOSIS — K648 Other hemorrhoids: Secondary | ICD-10-CM

## 2019-08-03 DIAGNOSIS — D123 Benign neoplasm of transverse colon: Secondary | ICD-10-CM

## 2019-08-03 DIAGNOSIS — K317 Polyp of stomach and duodenum: Secondary | ICD-10-CM | POA: Diagnosis not present

## 2019-08-03 DIAGNOSIS — R1030 Lower abdominal pain, unspecified: Secondary | ICD-10-CM

## 2019-08-03 DIAGNOSIS — K635 Polyp of colon: Secondary | ICD-10-CM

## 2019-08-03 DIAGNOSIS — D124 Benign neoplasm of descending colon: Secondary | ICD-10-CM

## 2019-08-03 MED ORDER — SODIUM CHLORIDE 0.9 % IV SOLN: 500.0000 mL | Freq: Once | INTRAVENOUS | Status: DC

## 2019-08-03 NOTE — Progress Notes (Signed)
Pt's states no medical or surgical changes since previsit or office visit.  Temp JB Vitals CW 

## 2019-08-03 NOTE — Op Note (Signed)
[facility] Patient Name: Jessica Taylor Procedure Date: 08/03/2019 1:30 PM MRN: [id number] Endoscopist: Justice Britain , MD Age: 78 Referring MD:  Date of Birth: 25-Apr-1941 Gender: Female Account #: [id number] Procedure:                Upper GI endoscopy Indications:              Iron deficiency anemia, Suspected gastro-esophageal                            reflux disease Medicines:                Monitored Anesthesia Care Procedure:                Pre-Anesthesia Assessment:                           - Prior to the procedure, a History and Physical                            was performed, and patient medications and                            allergies were reviewed. The patient's tolerance of                            previous anesthesia was also reviewed. The risks                            and benefits of the procedure and the sedation                            options and risks were discussed with the patient.                            All questions were answered, and informed consent                            was obtained. Prior Anticoagulants: The patient                            last took Plavix (clopidogrel) 6 days prior to the                            procedure and has taken no previous anticoagulant                            or antiplatelet agents except for aspirin. ASA                            Grade Assessment: III - A patient with severe                            systemic disease. After reviewing the risks and  benefits, the patient was deemed in satisfactory                            condition to undergo the procedure.                           After obtaining informed consent, the endoscope was                            passed under direct vision. Throughout the                            procedure, the patient's blood pressure, pulse, and                            oxygen saturations were monitored continuously.  The                            Endoscope was introduced through the mouth, and                            advanced to the second part of duodenum. The upper                            GI endoscopy was accomplished without difficulty.                            The patient tolerated the procedure. Scope In: Scope Out: Findings:                 No gross mucosal lesions were noted in the entire                            esophagus. Biopsies were taken with a cold forceps                            for histology.                           A widely patent and non-obstructing Schatzki ring                            was found at the gastroesophageal junction.                           A small hiatal hernia was present.                           Multiple small pedunculated and sessile polyps with                            no bleeding and no stigmata of recent bleeding were                            found in the gastric  body - endoscopically                            consistent with fundic gland polyps. Biopsies were                            taken with a cold forceps for histology from 2                            polyps.                           No other gross lesions were noted in the entire                            examined stomach. Biopsies were taken with a cold                            forceps for histology and Helicobacter pylori                            testing.                           No gross lesions were noted in the duodenal bulb,                            in the first portion of the duodenum and in the                            second portion of the duodenum. Biopsies for                            histology were taken with a cold forceps for                            evaluation of celiac disease. Complications:            No immediate complications. Estimated Blood Loss:     Estimated blood loss was minimal. Impression:               - No gross mucosal lesions in  esophagus. Biopsied.                           - Widely patent and non-obstructing Schatzki ring.                           - Small hiatal hernia.                           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 Biopsied                            to rule out adenoma.                           -  No other gross lesions in the stomach. Biopsied                            for HP.                           - No gross lesions in the duodenal bulb, in the                            first portion of the duodenum and in the second                            portion of the duodenum. Biopsied. Recommendation:           - Proceed to scheduled colonoscopy.                           - Observe patient's clinical course.                           - Continue current PPI dosing.                           - Await pathology results.                           - The findings and recommendations were discussed                            with the patient.                           - The findings and recommendations were discussed                            with the patient's family. Justice Britain, MD 08/03/2019 2:27:35 PM

## 2019-08-03 NOTE — Patient Instructions (Addendum)
YOU HAD AN ENDOSCOPIC PROCEDURE TODAY AT [facility]:   Refer to the procedure report that was given to you for any specific questions about what was found during the examination.  If the procedure report does not answer your questions, please call your gastroenterologist to clarify.  If you requested that your care partner not be given the details of your procedure findings, then the procedure report has been included in a sealed envelope for you to review at your convenience later.  **Handouts given on Diverticulosis, Hemorrhoids, polyps**  YOU SHOULD EXPECT: Some feelings of bloating in the abdomen. Passage of more gas than usual.  Walking can help get rid of the air that was put into your GI tract during the procedure and reduce the bloating. If you had a lower endoscopy (such as a colonoscopy or flexible sigmoidoscopy) you may notice spotting of blood in your stool or on the toilet paper. If you underwent a bowel prep for your procedure, you may not have a normal bowel movement for a few days.  Please Note:  You might notice some irritation and congestion in your nose or some drainage.  This is from the oxygen used during your procedure.  There is no need for concern and it should clear up in a day or so.  SYMPTOMS TO REPORT IMMEDIATELY:   Following lower endoscopy (colonoscopy or flexible sigmoidoscopy):  Excessive amounts of blood in the stool  Significant tenderness or worsening of abdominal pains  Swelling of the abdomen that is new, acute  Fever of 100F or higher   Following upper endoscopy (EGD)  Vomiting of blood or coffee ground material  New chest pain or pain under the shoulder blades  Painful or persistently difficult swallowing  New shortness of breath  Fever of 100F or higher  Black, tarry-looking stools  For urgent or emergent issues, a gastroenterologist can be reached at any hour by calling [phone].   DIET:  We do recommend a small meal at  first, but then you may proceed to your regular diet.  Drink plenty of fluids but you should avoid alcoholic beverages for 24 hours.  ACTIVITY:  You should plan to take it easy for the rest of today and you should NOT DRIVE or use heavy machinery until tomorrow (because of the sedation medicines used during the test).    FOLLOW UP: Our staff will call the number listed on your records 48-72 hours following your procedure to check on you and address any questions or concerns that you may have regarding the information given to you following your procedure. If we do not reach you, we will leave a message.  We will attempt to reach you two times.  During this call, we will ask if you have developed any symptoms of COVID 19. If you develop any symptoms (ie: fever, flu-like symptoms, shortness of breath, cough etc.) before then, please call [phone].  If you test positive for Covid 19 in the 2 weeks post procedure, please call and report this information to [place].    If any biopsies were taken you will be contacted by phone or by letter within the next 1-3 weeks.  Please call us at [phone] if you have not heard about the biopsies in 3 weeks.    SIGNATURES/CONFIDENTIALITY: You and/or your care partner have signed paperwork which will be entered into your electronic medical record.  These signatures attest to the fact that that the information above on your After  Visit Summary has been reviewed and is understood.  Full responsibility of the confidentiality of this discharge information lies with you and/or your care-partner. 

## 2019-08-03 NOTE — Op Note (Addendum)
Jessica Taylor Patient Name: Jessica Taylor Procedure Date: 08/03/2019 1:29 PM MRN: [id number] Endoscopist: Justice Britain , MD Age: 78 Referring MD:  Date of Birth: 1941-01-29 Gender: Female Account #: [id number] Procedure:                Colonoscopy Indications:              This is the patient's first colonoscopy, Lower                            abdominal pain, Hematochezia, Iron deficiency anemia Medicines:                Monitored Anesthesia Care Procedure:                Pre-Anesthesia Assessment:                           - Prior to the procedure, a History and Physical                            was performed, and patient medications and                            allergies were reviewed. The patient's tolerance of                            previous anesthesia was also reviewed. The risks                            and benefits of the procedure and the sedation                            options and risks were discussed with the patient.                            All questions were answered, and informed consent                            was obtained. Prior Anticoagulants: The patient                            last took Plavix (clopidogrel) 6 days prior to the                            procedure and has taken no previous anticoagulant                            or antiplatelet agents except for aspirin. ASA                            Grade Assessment: III - A patient with severe                            systemic disease. After reviewing the risks and  benefits, the patient was deemed in satisfactory                            condition to undergo the procedure.                           After obtaining informed consent, the colonoscope                            was passed under direct vision. Throughout the                            procedure, the patient's blood pressure, pulse, and                            oxygen saturations  were monitored continuously. The                            Colonoscope was introduced through the anus and                            advanced to the the ascending colon to examine a                            mass. This was the intended extent. The colonoscopy                            was performed without difficulty. The patient                            tolerated the procedure. The quality of the bowel                            preparation was good. Scope In: 1:47:50 PM Scope Out: 2:18:12 PM Total Procedure Duration: 0 hours 30 minutes 22 seconds  Findings:                 Skin tags were found on perianal exam.                           The digital rectal exam findings include                            hemorrhoids. Pertinent negatives include no                            palpable rectal lesions.                           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 The mass was circumferential (involving                            100% of the lumen circumference). Oozing was                            present. Biopsies were taken with a cold forceps                            for histology. Area distal to the mass was tattooed                            with an injection of Spot (carbon black).                           Six sessile polyps were found in the rectum (2),                            descending colon (1), transverse colon (2) and                            hepatic flexure (1). The polyps were 3 to 8 mm in                            size. These polyps were removed with a cold snare.                            Resection and retrieval were complete.                           Many small and  large-mouthed diverticula were found                            in the entire colon.                           Non-bleeding non-thrombosed external and internal                            hemorrhoids were found during retroflexion, during                            perianal exam and during digital exam. The                            hemorrhoids were Grade II (internal hemorrhoids  that prolapse but reduce spontaneously). Complications:            No immediate complications. Estimated Blood Loss:     Estimated blood loss was minimal. Impression:               - Perianal skin tags found on perianal exam.                           - Hemorrhoids found on digital rectal exam.                           - Rule out malignancy, completely obstructing tumor                            at the hepatic flexure and in the ascending colon.                            Biopsied. Tattooed.                           - Six 3 to 8 mm polyps in the rectum, in the                            descending colon, in the transverse colon and at                            the hepatic flexure, removed with a cold snare.                            Resected and retrieved.                           - Diverticulosis in the entire examined colon.                           - Non-bleeding non-thrombosed external and internal                            hemorrhoids. Recommendation:           - The patient will be observed post-procedure,                            until all discharge criteria are met.                           - Discharge patient to home.                           - Patient has a contact number available for                            emergencies. The signs and symptoms of potential                            delayed complications were discussed with the  patient. Return to normal activities tomorrow.                            Written discharge instructions  were provided to the                            patient.                           - Continue present medications.                           - Would be aggressive with ensuring bowel movements                            are moving. Fiber once daily. Miralax 1-2 times                            daily to ensure you are having a bowel movement                            daily or every other day.                           - Await pathology results.                           - Proceed with obtaining a CEA level within the                            next week.                           - Will proceed with scheduling a                            CT-Chest/Abdomen/Pelvis with IV/PO contrast to                            further evaluate and begin staging.                           - Once biopsies have returned will proceed with                            Oncology and Surgical Oncology referral.                           - Continue current IV Iron infusions (1 more                            scheduled and would plan at least 1 more to be done                            1 month after completion of next infusion).                           -  Repeat colonoscopy in 1-year to be placed for                            follow up at this time.                           - Patient will be at risk of continued oozing                            intermittently from the colonic mass, however, will                            need to have further discussion with Cardiology                            prior to patient stopping Plavix long-term. To                            decrease risk of bleeding                            post-polypectomy/intervention may restart Plavix in                            72 hours (Sunday evening v Monday morning).                           - The findings and recommendations were discussed                            with the patient.                           - The findings and  recommendations were discussed                            with the patient's family. Justice Britain, MD 08/03/2019 2:36:04 PM

## 2019-08-03 NOTE — Progress Notes (Signed)
Called to room to assist during endoscopic procedure.  Patient ID and intended procedure confirmed with present staff. Received instructions for my participation in the procedure from the performing physician.  

## 2019-08-03 NOTE — Progress Notes (Signed)
Report to PACU, RN, vss, BBS= Clear.  

## 2019-08-04 ENCOUNTER — Telehealth: Payer: Self-pay

## 2019-08-04 DIAGNOSIS — K6389 Other specified diseases of intestine: Secondary | ICD-10-CM

## 2019-08-04 NOTE — Telephone Encounter (Signed)
9/15 3 pm arrive 245 pm [facility] NPO 4 hours pt instructed and already has the contrast

## 2019-08-04 NOTE — Telephone Encounter (Signed)
Per 9/10 procedure report the pt will need to have CT Chest abd and pelvis for colon mass

## 2019-08-07 ENCOUNTER — Telehealth: Payer: Self-pay | Admitting: *Deleted

## 2019-08-07 NOTE — Telephone Encounter (Signed)
  Follow up Call-  Call back number 08/03/2019  Post procedure Call Back phone  # [phone]  Permission to leave phone message Yes  Some recent data might be hidden     Patient questions:  Do you have a fever, pain , or abdominal swelling? No. Pain Score  0 *  Have you tolerated food without any problems? Yes.    Have you been able to return to your normal activities? Yes.    Do you have any questions about your discharge instructions: Diet   No. Medications  No. Follow up visit  No.  Do you have questions or concerns about your Care? No.  Actions: * If pain score is 4 or above: No action needed, pain <4.  1. Have you developed a fever since your procedure? no  2.   Have you had an respiratory symptoms (SOB or cough) since your procedure? No  3.   Have you tested positive for COVID 19 since your procedure no  4.   Have you had any family members/close contacts diagnosed with the COVID 19 since your procedure?  no   If yes to any of these questions please route to Joylene John, RN and Alphonsa Gin, Therapist, sports.

## 2019-08-08 ENCOUNTER — Other Ambulatory Visit: Payer: Self-pay

## 2019-08-08 ENCOUNTER — Ambulatory Visit (HOSPITAL_COMMUNITY)
Admission: RE | Admit: 2019-08-08 | Discharge: 2019-08-08 | Disposition: A | Discharge status: Home / Self Care | Payer: Medicare Other | Source: Ambulatory Visit | Attending: Gastroenterology | Admitting: Gastroenterology

## 2019-08-08 DIAGNOSIS — C189 Malignant neoplasm of colon, unspecified: Secondary | ICD-10-CM

## 2019-08-08 DIAGNOSIS — I714 Abdominal aortic aneurysm, without rupture: Secondary | ICD-10-CM | POA: Insufficient documentation

## 2019-08-08 DIAGNOSIS — I2510 Atherosclerotic heart disease of native coronary artery without angina pectoris: Secondary | ICD-10-CM | POA: Insufficient documentation

## 2019-08-08 DIAGNOSIS — Z951 Presence of aortocoronary bypass graft: Secondary | ICD-10-CM | POA: Insufficient documentation

## 2019-08-08 DIAGNOSIS — K6389 Other specified diseases of intestine: Secondary | ICD-10-CM | POA: Diagnosis present

## 2019-08-08 DIAGNOSIS — N200 Calculus of kidney: Secondary | ICD-10-CM | POA: Diagnosis not present

## 2019-08-08 MED ORDER — SODIUM CHLORIDE (PF) 0.9 % IJ SOLN
INTRAMUSCULAR | Status: AC
  Filled 2019-08-08: qty 50

## 2019-08-08 MED ORDER — IOHEXOL 300 MG/ML  SOLN
75.0000 mL | Freq: Once | INTRAMUSCULAR | Status: AC | PRN
  Administered 2019-08-08: 75 mL via INTRAVENOUS

## 2019-08-09 ENCOUNTER — Ambulatory Visit (HOSPITAL_COMMUNITY)
Admission: RE | Admit: 2019-08-09 | Discharge: 2019-08-09 | Disposition: A | Discharge status: Home / Self Care | Payer: Medicare Other | Source: Ambulatory Visit | Attending: Internal Medicine | Admitting: Internal Medicine

## 2019-08-09 ENCOUNTER — Other Ambulatory Visit: Payer: Medicare Other

## 2019-08-09 ENCOUNTER — Other Ambulatory Visit: Payer: Self-pay

## 2019-08-09 DIAGNOSIS — D509 Iron deficiency anemia, unspecified: Secondary | ICD-10-CM

## 2019-08-09 MED ORDER — SODIUM CHLORIDE 0.9 % IV SOLN
510.0000 mg | Freq: Once | INTRAVENOUS | Status: AC
  Administered 2019-08-09: 510 mg via INTRAVENOUS
  Filled 2019-08-09: qty 17

## 2019-08-09 MED ORDER — SODIUM CHLORIDE 0.9 % IV SOLN
INTRAVENOUS | Status: DC | PRN
  Administered 2019-08-09: 250 mL via INTRAVENOUS

## 2019-08-09 NOTE — Progress Notes (Signed)
Patient received Feraheme via PIV. Pre infusion BP was 159/60.  Mansouraty G. MD's office was contacted. Per MD, RN should infuse. Patient observed for at least 30 minutes post infusion. Post infusion BP was156/66. Per MD if post infusion systolic BP is not up to 106, patient can be discharged.Tolerated well, discharge instructions given, verbalized understanding. Patient alert, oriented and ambulatory at the time of discharge.

## 2019-08-10 LAB — CEA: CEA: 39.5 ng/mL — ABNORMAL HIGH

## 2019-08-11 ENCOUNTER — Telehealth: Payer: Self-pay

## 2019-08-11 NOTE — Telephone Encounter (Signed)
Called Jessica Taylor to establish contact and to introduce role of Nurse Navigator. Direct contact information provided. She understands that she can call with questions or concerns. No barriers to tx identified.

## 2019-08-14 ENCOUNTER — Ambulatory Visit: Payer: Self-pay | Admitting: Surgery

## 2019-08-14 ENCOUNTER — Telehealth: Payer: Self-pay | Admitting: *Deleted

## 2019-08-14 NOTE — H&P (Signed)
[name] Documented: 08/14/2019 10:45 AM Location: [facility] Patient #: [id number] DOB: 30-Sep-1941 Married / Language: [name] / Race: White Female  History of Present Illness Adin Hector MD; 08/14/2019 11:43 AM) The patient is a 78 year old female who presents with colorectal cancer. Note for "Colorectal cancer": ` ` ` Patient sent for surgical consultation at the request of Dr Luciana Axe  Chief Complaint: New ascending colon cancer. ` ` The patient is a pleasant elderly woman. She comes here with her daughter-in-law since she confesses since hard to remember everything. He had worsening anemia and bowel changes. And of getting transfused. She thinks she might of notice some blood occasionally. No severe melena or massive bright red blood in bowel movements. Underwent endoscopy. Found to have bulky near obstructing tumor in proximal ascending colon. Biopsy consistent with adenocarcinoma. Given IV iron transfusions. Surgical consultation requested. Patient does not have great recollection of this. Her husband is rather hard of hearing and apparently did not recall much as there. That is the reason why the daughter-in-law is here to help sort things out. Patient notes usually moves her bowels about every other day but lately has been only twice a week. Some discomfort with eating but no nausea or vomiting. Patient never had a colonoscopy before him. No prior daily gastrointestinal issues that she can recall. She had tubal ligation in 1963, no other surgeries.  She does have a history coronary disease that required a 3 vessel bypass by Dr. Cyndia Bent in 2017. She has been on Plavix before and since. Follow-up by Dr. Marlou Porch. She can only walk a block or 2 before her knees give out. No real exertional chest pain or shortness of breath. She has not smoked in many years. She is not diabetic. Denies much in way of heartburn or reflux issues. All her  children have had colonoscopies. No personal nor family history of nflammatory bowel disease, irritable bowel syndrome, allergy such as Celiac Sprue, dietary/dairy problems, colitis, ulcers nor gastritis. No recent sick contacts/gastroenteritis. No travel outside the country. No changes in diet. No dysphagia to solids or liquids. No significant heartburn or reflux. No hematemesis, coffee ground emesis. No evidence of prior gastric/peptic ulceration.  (Review of systems as stated in this history (HPI) or in the review of systems. Otherwise all other 12 point ROS are negative) ` ` `   Allergies (Sabrina Canty, CMA; 08/14/2019 10:45 AM) Shrimp Allergies Reconciled  Medication History (Sabrina Canty, CMA; 08/14/2019 10:47 AM) Metoprolol Tartrate (25MG  Tablet, Oral) Active. Levothyroxine Sodium (50MCG Tablet, Oral) Active. Atorvastatin Calcium (40MG  Tablet, Oral) Active. Clopidogrel Bisulfate (75MG  Tablet, Oral) Active. HYDROcodone-Acetaminophen (5-325MG  Tablet, Oral) Active. Ondansetron (4MG  Tablet Disint, Oral) Active. Plavix (75MG  Tablet, Oral) Active. Lipitor (20MG  Tablet, Oral) Active. Medications Reconciled    Vitals (Sabrina Canty CMA; 08/14/2019 10:47 AM) 08/14/2019 10:47 AM Weight: 149.8 lb Height: 63in Body Surface Area: 1.71 m Body Mass Index: 26.54 kg/m  Temp.: 97.53F(Temporal)  Pulse: 92 (Regular)  BP: 152/84 (Sitting, Left Arm, Standard)        Physical Exam Adin Hector MD; 08/14/2019 11:39 AM)  General Mental Status-Alert. General Appearance-Not in acute distress, Not Sickly. Orientation-Oriented X3. Hydration-Well hydrated. Voice-Normal.  Integumentary Global Assessment Upon inspection and palpation of skin surfaces of the - Axillae: non-tender, no inflammation or ulceration, no drainage. and Distribution of scalp and body hair is normal. General Characteristics Temperature - normal warmth is noted.  Head  and Neck Head-normocephalic, atraumatic with no lesions or palpable  masses. Face Global Assessment - atraumatic, no absence of expression. Neck Global Assessment - no abnormal movements, no bruit auscultated on the right, no bruit auscultated on the left, no decreased range of motion, non-tender. Trachea-midline. Thyroid Gland Characteristics - non-tender.  Eye Eyeball - Left-Extraocular movements intact, No Nystagmus - Left. Eyeball - Right-Extraocular movements intact, No Nystagmus - Right. Cornea - Left-No Hazy - Left. Cornea - Right-No Hazy - Right. Sclera/Conjunctiva - Left-No scleral icterus, No Discharge - Left. Sclera/Conjunctiva - Right-No scleral icterus, No Discharge - Right. Pupil - Left-Direct reaction to light normal. Pupil - Right-Direct reaction to light normal.  ENMT Ears Pinna - Left - no drainage observed, no generalized tenderness observed. Pinna - Right - no drainage observed, no generalized tenderness observed. Nose and Sinuses External Inspection of the Nose - no destructive lesion observed. Inspection of the nares - Left - quiet respiration. Inspection of the nares - Right - quiet respiration. Mouth and Throat Lips - Upper Lip - no fissures observed, no pallor noted. Lower Lip - no fissures observed, no pallor noted. Nasopharynx - no discharge present. Oral Cavity/Oropharynx - Tongue - no dryness observed. Oral Mucosa - no cyanosis observed. Hypopharynx - no evidence of airway distress observed.  Chest and Lung Exam Inspection Movements - Normal and Symmetrical. Accessory muscles - No use of accessory muscles in breathing. Palpation Palpation of the chest reveals - Non-tender. Auscultation Breath sounds - Normal and Clear.  Cardiovascular Auscultation Rhythm - Regular. Murmurs & Other Heart Sounds - Auscultation of the heart reveals - No Murmurs and No Systolic Clicks.  Abdomen Inspection Inspection of the abdomen reveals - No  Visible peristalsis and No Abnormal pulsations. Umbilicus - No Bleeding, No Urine drainage. Palpation/Percussion Palpation and Percussion of the abdomen reveal - Soft, Non Tender, No Rebound tenderness, No Rigidity (guarding) and No Cutaneous hyperesthesia. Note: Abdomen obese but soft. Mild symmetrical diastases and thinning but no true hernia. Mild distention and discomfort. No focal region. Soft. Not severely distended. No umbilical or other anterior abdominal wall hernias  Female Genitourinary Sexual Maturity Tanner 5 - Adult hair pattern. Note: No vaginal bleeding nor discharge  Peripheral Vascular Upper Extremity Inspection - Left - No Cyanotic nailbeds - Left, Not Ischemic. Inspection - Right - No Cyanotic nailbeds - Right, Not Ischemic.  Neurologic Neurologic evaluation reveals -normal attention span and ability to concentrate, able to name objects and repeat phrases. Appropriate fund of knowledge , normal sensation and normal coordination. Mental Status Affect - not angry, not paranoid. Cranial Nerves-Normal Bilaterally. Gait-Normal.  Neuropsychiatric Mental status exam performed with findings of-able to articulate well with normal speech/language, rate, volume and coherence, thought content normal with ability to perform basic computations and apply abstract reasoning and no evidence of hallucinations, delusions, obsessions or homicidal/suicidal ideation. Note: Long-term memory fair. No frank dementia or psychosis. No delirium.  Musculoskeletal Global Assessment Spine, Ribs and Pelvis - no instability, subluxation or laxity. Right Upper Extremity - no instability, subluxation or laxity.  Lymphatic Head & Neck  General Head & Neck Lymphatics: Bilateral - Description - No Localized lymphadenopathy. Axillary  General Axillary Region: Bilateral - Description - No Localized lymphadenopathy. Femoral & Inguinal  Generalized Femoral & Inguinal Lymphatics: Left -  Description - No Localized lymphadenopathy. Right - Description - No Localized lymphadenopathy.    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 At least partially obstructing according to hernia surgery urologist Some suspicious lymphadenopathy and tumor extension but no frank metastatic disease.  Standard of care would be segmental colectomy. Reasonable robotic minimally invasive approach for intracorporeal anastomosis.  I would like cardiac clearance given her history of coronary disease on chronic Plavix anticoagulation. Mobility is limited due to her moderate knee degenerative joint disease, but her echocardiogram preop was Exie pretty good and she got a good result from a three-vessel bypass in 2017. Would like to make sure Dr. Luther Parody doesn't need further workup/clearance.  I spent extra time going over the pathophysiology and natural history of disease since the patient did not have much of a recollection and her husband, who is hard of hearing, did not recall much. Daughter-in-law hearing about this the first time. I printed copies of the colonoscopy and the pathology results. Again went over her symptoms that led to the workup in the diagnosis. Questions answered. Patient and her daughter lower expressed understanding and agree with pursuing surgery first. Make sure there cardiologist does not have any concerns for further workup.   PREOP COLON - ENCOUNTER FOR PREOPERATIVE EXAMINATION FOR GENERAL SURGICAL PROCEDURE (Z01.818)  Current Plans You are being scheduled for surgery- Our schedulers will call you.  You should hear from our office's scheduling department within 5 working days about the location, date, and time of surgery. We try to make accommodations for patient's preferences in  scheduling surgery, but sometimes the OR schedule or the surgeon's schedule prevents [place] from making those accommodations.  If you have not heard from our office [phone]) in 5 working days, call the office and ask for your surgeon's nurse.  If you have other questions about your diagnosis, plan, or surgery, call the office and ask for your surgeon's nurse.  Written instructions provided The anatomy & physiology of the digestive tract was discussed. The pathophysiology of the colon was discussed. Natural history risks without surgery was discussed. I feel the risks of no intervention will lead to serious problems that outweigh the operative risks; therefore, I recommended a partial colectomy to remove the pathology. Minimally invasive (Robotic/Laparoscopic) & open techniques were discussed.  Risks such as bleeding, infection, abscess, leak, reoperation, possible ostomy, hernia, heart attack, death, and other risks were discussed. I noted a good likelihood this will help address the problem. Goals of post-operative recovery were discussed as well. Need for adequate nutrition, daily bowel regimen and healthy physical activity, to optimize recovery was noted as well. We will work to minimize complications. Educational materials were available as well. Questions were answered. The patient expresses understanding & wishes to proceed with surgery.  Pt Education - [facility] Colon Bowel Prep 2018 ERAS/Miralax/Antibiotics Started Neomycin Sulfate 500 MG Oral Tablet, 2 (two) Tablet SEE NOTE, #6, 08/14/2019, No Refill. Local Order: Pharmacist Notes: TAKE TWO TABLETS AT 2 PM, 3 PM, AND 10 PM THE DAY PRIOR TO SURGERY Started Flagyl 500 MG Oral Tablet, 2 (two) Tablet SEE NOTE, #6, 08/14/2019, No Refill. Local Order: Pharmacist Notes: Take at 2pm, 3pm, and 10pm the day prior to your colon operation Pt Education - Pamphlet Given - Laparoscopic Colorectal Surgery: discussed with patient and provided  information. Pt Education - [facility] Colectomy post-op instructions: discussed with patient and provided information.  CORONARY ARTERY DISEASE (I25.10)  Current Plans I recommended obtaining preoperative cardiac clearance. I am concerned about the health of the patient and the ability to tolerate the operation. Therefore, we will request clearance by cardiology to better  assess operative risk & see if a reevaluation, further workup, etc is needed. Also recommendations on how medications such as for anticoagulation and blood pressure should be managed/held/restarted after surgery.  ANTICOAGULATED (Z79.01)  Current Plans Pt Education - [facility] Hold anticoagulation preoperatively  Adin Hector, MD, FACS, MASCRS Gastrointestinal and Minimally Invasive Surgery    [address] [place], [place] [address] [phone] Main / Paging [phone] Fax

## 2019-08-14 NOTE — Telephone Encounter (Signed)
   [facility] Pre-operative Risk Assessment    Request for surgical clearance:  1. What type of surgery is being performed? ROBOTIC PROXIMAL COLECTOMY   2. When is this surgery scheduled? TBD   3. What type of clearance is required (medical clearance vs. Pharmacy clearance to hold med vs. Both)? MEDICAL  4. Are there any medications that need to be held prior to surgery and how long? PLAVIX    5. Practice name and name of physician performing surgery? [facility]; DR. Remo Lipps GROSS   6. What is your office phone number [phone]    7.   What is your office fax number [phone]  8.   Anesthesia type (None, local, MAC, general) ? GENERAL   Jessica Taylor 08/14/2019, 4:33 PM  _________________________________________________________________   (provider comments below)

## 2019-08-15 ENCOUNTER — Encounter: Payer: Self-pay | Admitting: Gastroenterology

## 2019-08-15 ENCOUNTER — Telehealth: Payer: Self-pay

## 2019-08-15 NOTE — Telephone Encounter (Signed)
Left a message for the patient informing her that I was calling to get her scheduled for an appointment for cardiac clearance either in office or virtual within the next week with APP and that I will be giving her a call back.

## 2019-08-15 NOTE — Telephone Encounter (Signed)
   Primary Cardiologist:Mark Marlou Porch, MD  Chart reviewed as part of pre-operative protocol coverage. Because of Jessica Taylor's past medical history and time since last visit, he/she will require a follow-up visit in order to better assess preoperative cardiovascular risk.  Pre-op covering staff: - Please schedule appointment and call patient to inform them. - Please contact requesting surgeon's office via preferred method (i.e, phone, fax) to inform them of need for appointment prior to surgery.  If applicable, this message will also be routed to pharmacy pool and/or primary cardiologist for input on holding anticoagulant/antiplatelet agent as requested below so that this information is available at time of patient's appointment.   [place], [place]  08/15/2019, 4:21 PM

## 2019-08-15 NOTE — Telephone Encounter (Signed)
Called and spoke with patient. She is scheduled to see Almyra Deforest, PA-C on 08/29/19 at 1:45PM. Patient was given the address to the office and telephone number. All questions (if any) were answered.

## 2019-08-15 NOTE — Telephone Encounter (Signed)
Assisting in Pre-op pool. Called and left a message for the patient informing her that I was calling to get her scheduled for an appointment for cardiac clearance either in office or virtual within the next week with APP. Will try to call patient again.

## 2019-08-23 ENCOUNTER — Other Ambulatory Visit: Payer: Self-pay | Admitting: Nurse Practitioner

## 2019-08-29 ENCOUNTER — Encounter: Payer: Self-pay | Admitting: Physician Assistant

## 2019-08-29 ENCOUNTER — Other Ambulatory Visit: Payer: Self-pay

## 2019-08-29 ENCOUNTER — Ambulatory Visit: Payer: Medicare Other | Admitting: General Practice

## 2019-08-29 VITALS — BP 121/67 | HR 52 | Temp 97.2°F | Ht 63.0 in | Wt 147.0 lb

## 2019-08-29 DIAGNOSIS — Z01818 Encounter for other preprocedural examination: Secondary | ICD-10-CM

## 2019-08-29 DIAGNOSIS — I739 Peripheral vascular disease, unspecified: Secondary | ICD-10-CM | POA: Diagnosis not present

## 2019-08-29 DIAGNOSIS — Z951 Presence of aortocoronary bypass graft: Secondary | ICD-10-CM | POA: Diagnosis not present

## 2019-08-29 DIAGNOSIS — I10 Essential (primary) hypertension: Secondary | ICD-10-CM

## 2019-08-29 NOTE — Progress Notes (Signed)
Cardiology Clinic Note   Patient Name: AFNAN CADIENTE Date of Encounter: 08/29/2019  Primary Care Provider:  Antony Contras, MD Primary Cardiologist:  Candee Furbish, MD  Patient Profile    Suzan Nailer. Spengler 78 year old female presents today for follow-up evaluation of her coronary artery disease (CABG 01/24/2016), hypertension, and hyperlipidemia.  Past Medical History    Past Medical History:  Diagnosis Date  . Abdominal aortic aneurysm ([place])   . Acid reflux   . Anemia   . Arthritis    knees , R shoulder - tx /w injection - 11/2014  . Basal cell carcinoma (BCC) of dorsum of nose 2010   Resolved  . Cancer ([place])    basal cell on nose  . Coronary artery disease   . Hypercholesteremia   . Hypertension   . Hypothyroidism   . MI, old 69  . Peripheral arterial disease ([facility])    hhigh-grade ostial bilateral calcified iliac stenosis with claudication  . Tobacco abuse   . Vertigo    when lays on left side.   Past Surgical History:  Procedure Laterality Date  . CARDIAC CATHETERIZATION  5 stents  . CARDIAC CATHETERIZATION N/A 01/20/2016   Procedure: Left Heart Cath and Coronary Angiography;  Surgeon: Peter M Martinique, MD;  Location: [facility];  Service: Cardiovascular;  Laterality: N/A;  . CATARACT EXTRACTION W/PHACO Left 05/24/2014   Procedure: CATARACT EXTRACTION PHACO AND INTRAOCULAR LENS PLACEMENT (IOC);  Surgeon: Tonny Branch, MD;  Location: [facility];  Service: Ophthalmology;  Laterality: Left;  CDE:  9.30  . CATARACT EXTRACTION W/PHACO Right 06/18/2014   Procedure: CATARACT EXTRACTION PHACO AND INTRAOCULAR LENS PLACEMENT RIGHT EYE CDE=10.84;  Surgeon: Tonny Branch, MD;  Location: [facility];  Service: Ophthalmology;  Laterality: Right;  . CORONARY ARTERY BYPASS GRAFT N/A 01/24/2016   Procedure: CORONARY ARTERY BYPASS GRAFTING (CABG);  Surgeon: Gaye Pollack, MD;  Location: [facility];  Service: Open Heart Surgery;  Laterality: N/A;  . CORONARY STENT PLACEMENT    . CORONARY STENT PLACEMENT   03/06/15   CFX DES  . CYSTOSCOPY/URETEROSCOPY/HOLMIUM LASER/STENT PLACEMENT Left 09/12/2018   Procedure: CYSTOSCOPY/URETEROSCOPY/STENT PLACEMENT;  Surgeon: Ceasar Mons, MD;  Location: [facility];  Service: Urology;  Laterality: Left;  . EYE SURGERY    . LEFT HEART CATHETERIZATION WITH CORONARY ANGIOGRAM N/A 03/06/2015   Procedure: LEFT HEART CATHETERIZATION WITH CORONARY ANGIOGRAM;  Surgeon: Lorretta Harp, MD;  Location: [facility];  Service: Cardiovascular;  Laterality: N/A;  . PARTIAL KNEE ARTHROPLASTY Right 02/04/2015   Procedure: UNICOMPARTMENTAL KNEE;  Surgeon: Dorna Leitz, MD;  Location: [facility];  Service: Orthopedics;  Laterality: Right;  . PERIPHERAL VASCULAR CATHETERIZATION Bilateral 05/13/2015   Procedure: Lower Extremity Angiography;  Surgeon: Lorretta Harp, MD;  Location: [facility];  Service: Cardiovascular;  Laterality: Bilateral;  . PERIPHERAL VASCULAR CATHETERIZATION N/A 05/13/2015   Procedure: Abdominal Aortogram;  Surgeon: Lorretta Harp, MD;  Location: [facility];  Service: Cardiovascular;  Laterality: N/A;  . PERIPHERAL VASCULAR CATHETERIZATION Bilateral 06/27/2015   Procedure: Peripheral Vascular Intervention;  Surgeon: Lorretta Harp, MD;  Location: [facility];  Service: Cardiovascular;  Laterality: Bilateral;  ILIACS  . PERIPHERAL VASCULAR CATHETERIZATION Bilateral 06/27/2015   Procedure: Peripheral Vascular Atherectomy;  Surgeon: Lorretta Harp, MD;  Location: [facility];  Service: Cardiovascular;  Laterality: Bilateral;  . TEE WITHOUT CARDIOVERSION N/A 01/24/2016   Procedure: TRANSESOPHAGEAL ECHOCARDIOGRAM (TEE);  Surgeon: Gaye Pollack, MD;  Location: [facility];  Service: Open Heart Surgery;  Laterality: N/A;  . TONSILLECTOMY     age 54  . TUBAL LIGATION      Allergies  Allergies  Allergen Reactions  . Shrimp [Shellfish Allergy] Nausea And Vomiting    Throws up violently    History of Present Illness    Ms. Boan was  last seen by Dr. Marlou Porch on 05/18/2018.  During that time she complained of clavicle discomfort that resolved with aspirin, she was diagnosed as atypical chest pain.  She denied shortness of breath and back or jaw pain.  She also denied syncope, bleeding, and orthopnea.  She underwent CABG x3 on 01/24/2016 LIMA to LAD, SVG to ramus, and SVG to PDA. An echocardiogram from February 2017 showed an LVEF of 55 to 60%, and grade 2 diastolic dysfunction.  Her PMH also includes old myocardial infarction, essential hypertension, STEMI 03/13/2015, PAD, unstable angina, hypoxia, GERD, hypothyroidism, pyelonephritis, mixed hyperlipidemia, hypokalemia, hyperglycemia, claudication, hematuria, iron deficiency anemia, microcytic anemia, lower abdominal pain, and anorexia.  She presents the clinic today and states she feels well and has no complaints about cardiac related issues.  She does state that she has been very sedentary due to pain in her feet.  She states she also has bilateral knee pain which makes it hard for her to walk any distance.  Before the COVID-19 pandemic she frequently went to the grocery store and while holding onto the shopping cart she was able to ambulate around taking frequent breaks.  She denies chest pain, shortness of breath, lower extremity edema, fatigue, palpitations, melena, hematuria, hemoptysis, diaphoresis, weakness, presyncope, syncope, orthopnea, and PND.   Home Medications    Prior to Admission medications   Medication Sig Start Date End Date Taking? Authorizing Provider  aspirin EC 81 MG tablet Take 81 mg by mouth every evening.     [provider]  atorvastatin (LIPITOR) 40 MG tablet TAKE (1) TABLET BY MOUTH ONCE DAILY. 07/03/19   Burtis Junes, NP  clopidogrel (PLAVIX) 75 MG tablet TAKE (1) TABLET BY MOUTH ONCE DAILY. 07/28/19   Burtis Junes, NP  esomeprazole (NEXIUM) 20 MG capsule Take 20 mg by mouth daily at 12 noon.    [provider]  ferrous gluconate  (FERGON) 324 MG tablet Take 1 tablet (324 mg total) by mouth daily with breakfast. Patient not taking: Reported on 08/03/2019 07/26/19 08/25/19  Mansouraty, Telford Nab., MD  HYDROcodone-acetaminophen (NORCO/VICODIN) 5-325 MG tablet Take 1 tablet by mouth every 6 (six) hours as needed for moderate pain.    [provider]  levothyroxine (SYNTHROID, LEVOTHROID) 50 MCG tablet Take 50 mcg by mouth daily before breakfast.    [provider]  metoprolol tartrate (LOPRESSOR) 25 MG tablet Take 1 tablet (25 mg total) by mouth 2 (two) times daily. Please keep upcoming appt in October for future refills. Thank you 08/25/19   Burtis Junes, NP  nitroGLYCERIN (NITROSTAT) 0.4 MG SL tablet Place 1 tablet (0.4 mg total) under the tongue every 5 (five) minutes as needed for chest pain. 05/13/18   Jerline Pain, MD    Family History    Family History  Problem Relation Age of Onset  . Ovarian cancer Mother 36  . Cancer Father 36       unsure of which kind, "it was in his glands"  . Hypertension Maternal Grandmother   . Stroke Maternal Grandfather   . Hypertension Son   . Heart attack Neg Hx   . Colon cancer  Neg Hx   . Esophageal cancer Neg Hx   . Inflammatory bowel disease Neg Hx   . Liver disease Neg Hx   . Pancreatic cancer Neg Hx   . Rectal cancer Neg Hx   . Stomach cancer Neg Hx    She indicated that her mother is deceased. She indicated that her father is deceased. She indicated that her maternal grandmother is deceased. She indicated that her maternal grandfather is deceased. She indicated that her paternal grandmother is deceased. She indicated that her paternal grandfather is deceased. She indicated that the status of her son is unknown. She indicated that the status of her neg hx is unknown.  Social History    Social History   Socioeconomic History  . Marital status: Married    Spouse name: Not on file  . Number of children: 4  . Years of education: Not on file  . Highest  education level: Not on file  Occupational History  . Occupation: retired    Comment: worked as Presenter, broadcasting for [employer]  Social Needs  . Financial resource strain: Not hard at all  . Food insecurity    Worry: Never true    Inability: Never true  . Transportation needs    Medical: No    Non-medical: No  Tobacco Use  . Smoking status: Former Smoker    Packs/day: 1.50    Years: 42.00    Pack years: 63.00    Types: Cigarettes    Quit date: 05/19/1999    Years since quitting: 20.2  . Smokeless tobacco: Never Used  Substance and Sexual Activity  . Alcohol use: No  . Drug use: No  . Sexual activity: Yes    Birth control/protection: Surgical  Lifestyle  . Physical activity    Days per week: Patient refused    Minutes per session: Patient refused  . Stress: Only a little  Relationships  . Social connections    Talks on phone: More than three times a week    Gets together: More than three times a week    Attends religious service: More than 4 times per year    Active member of club or organization: No    Attends meetings of clubs or organizations: Never    Relationship status: Married  . Intimate partner violence    Fear of current or ex partner: No    Emotionally abused: No    Physically abused: No    Forced sexual activity: No  Other Topics Concern  . Not on file  Social History Narrative  . Not on file     Review of Systems    General:  No chills, fever, night sweats or weight changes.  Cardiovascular:  No chest pain, dyspnea on exertion, edema, orthopnea, palpitations, paroxysmal nocturnal dyspnea. Dermatological: No rash, lesions/masses Respiratory: No cough, dyspnea Urologic: No hematuria, dysuria Abdominal:   No nausea, vomiting, diarrhea, bright red blood per rectum, melena, or hematemesis Neurologic:  No visual changes, wkns, changes in mental status. All other systems reviewed and are otherwise negative except as noted above.  Physical Exam     VS:  BP 121/67   Pulse (!) 52   Temp (!) 97.2 F (36.2 C)   Wt 147 lb (66.7 kg)   SpO2 98%   BMI 26.04 kg/m  , BMI Body mass index is 26.04 kg/m. GEN: Well nourished, well developed, in no acute distress. HEENT: normal. Neck: Supple, no JVD, carotid bruits, or masses. Cardiac: RRR, no  murmurs, rubs, or gallops. No clubbing, cyanosis, edema.  Radials/DP/PT 2+ and equal bilaterally.  Respiratory:  Respirations regular and unlabored, clear to auscultation bilaterally. GI: Soft, nontender, nondistended, BS + x 4. MS: no deformity or atrophy. Skin: warm and dry, no rash. Neuro:  Strength and sensation are intact. Psych: Normal affect.  Accessory Clinical Findings    ECG personally reviewed by me today-sinus bradycardia left bundle branch block 52 bpm- No acute changes  EKG 10/21/2018 Sinus rhythm left bundle branch block 81 bpm  Echocardiogram 01/17/2016 Study Conclusions  - Left ventricle: The cavity size was normal. Systolic function was   normal. The estimated ejection fraction was in the range of 55%   to 60%. Wall motion was normal; there were no regional wall   motion abnormalities. Features are consistent with a pseudonormal   left ventricular filling pattern, with concomitant abnormal   relaxation and increased filling pressure (grade 2 diastolic   dysfunction). Doppler parameters are consistent with high   ventricular filling pressure  Assessment & Plan   Coronary artery disease- status post CABG x3 on 01/24/2016 LIMA to LAD, SVG to ramus, and SVG to PDA.  No chest pain today, no increased dyspnea with exertion. Continue aspirin 81 mg tablet daily Continue atorvastatin 40 mg tablet daily Continue clopidogrel 75 mg tablet daily Continue metoprolol tartrate 25 mg tablet twice daily Continue nitroglycerin 0.4 mg sublingual tablet as needed Heart healthy low-sodium diet Increase physical activity as tolerated Schedule Lexiscan Myoview- due to leg pain and bilateral  knee OA she is unable to complete 4 METS of activity.  Essential hypertension-blood pressure today 121/67 Continue metoprolol tartrate 25 mg tablet twice daily Heart healthy low-sodium diet Increase physical activity as tolerated  Peripheral arterial disease- previous bilateral iliac stenting.  No leg pain today. Continue aspirin 81 mg tablet daily Continue clopidogrel 75 mg tablet daily   Preop cardiac evaluation for-  robotic proximal colectomy surgery. Patient statesshe is unable to walk up 2 flights of stairs or walk 4 city blocksandback without lower extremity pain. A Lexiscan Myoview stress test will need to be conducted before surgery to give an accurate cardiac evaluation.  She is not cleared for surgery at this time.  Cardiac clearance pending stress test results.   Disposition: Follow-up with Dr. Marlou Porch in 1 year.  Deberah Pelton, NP-C 08/29/2019, 3:33 PM

## 2019-08-29 NOTE — Patient Instructions (Signed)
Medication Instructions:  Your physician recommends that you continue on your current medications as directed. Please refer to the Current Medication list given to you today.  If you need a refill on your cardiac medications before your next appointment, please call your pharmacy.   Lab work: NONE ordered at this time of appointment   If you have labs (blood work) drawn today and your tests are completely normal, you will receive your results only by: Marland Kitchen MyChart Message (if you have MyChart) OR . A paper copy in the mail If you have any lab test that is abnormal or we need to change your treatment, we will call you to review the results.  Testing/Procedures: Your physician has requested that you have a lexiscan myoview. For further information please visit [email or web address]. Please follow instruction sheet, as given.   Please schedule within 1 week  Follow-Up: At [facility], you and your health needs are our priority.  As part of our continuing mission to provide you with exceptional heart care, we have created designated Provider Care Teams.  These Care Teams include your primary Cardiologist (physician) and Advanced Practice Providers (APPs -  Physician Assistants and Nurse Practitioners) who all work together to provide you with the care you need, when you need it. You will need a follow up appointment in 12 months-October 2021.  Please call our office 2 months in July and/or August 2021 to schedule this appointment with Candee Furbish, MD or one of the following Advanced Practice Providers on your designated Care Team:   Truitt Merle, NP Cecilie Kicks, NP . Kathyrn Drown, NP  Any Other Special Instructions Will Be Listed Below (If Applicable).

## 2019-08-31 ENCOUNTER — Telehealth (HOSPITAL_COMMUNITY): Payer: Self-pay

## 2019-08-31 NOTE — Telephone Encounter (Signed)
Encounter complete. 

## 2019-09-01 ENCOUNTER — Other Ambulatory Visit: Payer: Self-pay

## 2019-09-01 ENCOUNTER — Ambulatory Visit (HOSPITAL_COMMUNITY)
Admission: RE | Admit: 2019-09-01 | Discharge: 2019-09-01 | Disposition: A | Discharge status: Home / Self Care | Payer: Medicare Other | Source: Ambulatory Visit | Attending: Cardiology | Admitting: Cardiology

## 2019-09-01 DIAGNOSIS — I219 Acute myocardial infarction, unspecified: Secondary | ICD-10-CM | POA: Diagnosis not present

## 2019-09-01 DIAGNOSIS — Z87891 Personal history of nicotine dependence: Secondary | ICD-10-CM | POA: Diagnosis not present

## 2019-09-01 DIAGNOSIS — I10 Essential (primary) hypertension: Secondary | ICD-10-CM | POA: Diagnosis not present

## 2019-09-01 DIAGNOSIS — I2510 Atherosclerotic heart disease of native coronary artery without angina pectoris: Secondary | ICD-10-CM | POA: Insufficient documentation

## 2019-09-01 DIAGNOSIS — Z951 Presence of aortocoronary bypass graft: Secondary | ICD-10-CM | POA: Insufficient documentation

## 2019-09-01 DIAGNOSIS — Z01818 Encounter for other preprocedural examination: Secondary | ICD-10-CM | POA: Diagnosis present

## 2019-09-01 DIAGNOSIS — I447 Left bundle-branch block, unspecified: Secondary | ICD-10-CM | POA: Diagnosis not present

## 2019-09-01 DIAGNOSIS — I252 Old myocardial infarction: Secondary | ICD-10-CM | POA: Insufficient documentation

## 2019-09-01 DIAGNOSIS — E039 Hypothyroidism, unspecified: Secondary | ICD-10-CM | POA: Diagnosis not present

## 2019-09-01 LAB — MYOCARDIAL PERFUSION IMAGING
LV dias vol: 82 mL (ref 46–106)
LV sys vol: 47 mL
Peak HR: 84 {beats}/min
Rest HR: 55 {beats}/min
SDS: 4
SRS: 9
SSS: 13
TID: 1.17

## 2019-09-01 MED ORDER — REGADENOSON 0.4 MG/5ML IV SOLN
0.4000 mg | Freq: Once | INTRAVENOUS | Status: AC
  Administered 2019-09-01: 0.4 mg via INTRAVENOUS

## 2019-09-01 MED ORDER — TECHNETIUM TC 99M TETROFOSMIN IV KIT
29.6000 | PACK | Freq: Once | INTRAVENOUS | Status: AC | PRN
  Administered 2019-09-01: 29.6 via INTRAVENOUS
  Filled 2019-09-01: qty 30

## 2019-09-01 MED ORDER — TECHNETIUM TC 99M TETROFOSMIN IV KIT
10.6000 | PACK | Freq: Once | INTRAVENOUS | Status: AC | PRN
  Administered 2019-09-01: 10.6 via INTRAVENOUS
  Filled 2019-09-01: qty 11

## 2019-09-04 NOTE — H&P (View-Only) (Signed)
Jessica Taylor had her stress test preop cardiac evaluation for a robotic colectomy.  Her prior EF was 55 to 60%.  What are your thoughts here should we clear her for surgery? thanks [employer]

## 2019-09-04 NOTE — Progress Notes (Signed)
Jessica Taylor had her stress test preop cardiac evaluation for a robotic colectomy.  Her prior EF was 55 to 60%.  What are your thoughts here should we clear her for surgery? thanks [employer]

## 2019-09-05 ENCOUNTER — Other Ambulatory Visit: Payer: Self-pay | Admitting: Nurse Practitioner

## 2019-09-07 ENCOUNTER — Telehealth (INDEPENDENT_AMBULATORY_CARE_PROVIDER_SITE_OTHER): Payer: Self-pay | Admitting: General Practice

## 2019-09-07 ENCOUNTER — Other Ambulatory Visit: Payer: Self-pay | Admitting: General Practice

## 2019-09-07 DIAGNOSIS — R9430 Abnormal result of cardiovascular function study, unspecified: Secondary | ICD-10-CM

## 2019-09-07 NOTE — Telephone Encounter (Signed)
Left message to schedule cath as requested by Coletta Memos, NP

## 2019-09-07 NOTE — Addendum Note (Signed)
Addended by: Waylan Rocher on: 09/07/2019 02:58 PM   Modules accepted: Orders

## 2019-09-07 NOTE — Progress Notes (Signed)
NOT NEEDED

## 2019-09-07 NOTE — Telephone Encounter (Signed)
Called pt and went over cath/lab and COVID instructions, see letter. Pt verbalizes understanding she will CB if she has any further questions.

## 2019-09-07 NOTE — Telephone Encounter (Signed)
Jessica Taylor was contacted and her stress test results were reviewed.  She was informed that she would need a cardiac catheterization in order for cardiac clearance to be given for her upcoming surgery.  She expressed understanding and all of her questions were answered.  She is willing to proceed with cardiac catheterization at this time.

## 2019-09-08 ENCOUNTER — Other Ambulatory Visit: Payer: Self-pay

## 2019-09-08 ENCOUNTER — Other Ambulatory Visit (HOSPITAL_COMMUNITY)
Admission: RE | Admit: 2019-09-08 | Discharge: 2019-09-08 | Disposition: A | Discharge status: Home / Self Care | Payer: Medicare Other | Source: Ambulatory Visit | Attending: Cardiovascular Disease | Admitting: Cardiovascular Disease

## 2019-09-08 DIAGNOSIS — Z20828 Contact with and (suspected) exposure to other viral communicable diseases: Secondary | ICD-10-CM | POA: Insufficient documentation

## 2019-09-08 DIAGNOSIS — Z01812 Encounter for preprocedural laboratory examination: Secondary | ICD-10-CM | POA: Insufficient documentation

## 2019-09-08 DIAGNOSIS — R9430 Abnormal result of cardiovascular function study, unspecified: Secondary | ICD-10-CM

## 2019-09-09 LAB — CBC
Hematocrit: 34.8 % (ref 34.0–46.6)
Hemoglobin: 11 g/dL — ABNORMAL LOW (ref 11.1–15.9)
MCH: 25.5 pg — ABNORMAL LOW (ref 26.6–33.0)
MCHC: 31.6 g/dL (ref 31.5–35.7)
MCV: 81 fL (ref 79–97)
Platelets: 264 10*3/uL (ref 150–450)
RBC: 4.31 x10E6/uL (ref 3.77–5.28)
RDW: 20.8 % — ABNORMAL HIGH (ref 11.7–15.4)
WBC: 7.8 10*3/uL (ref 3.4–10.8)

## 2019-09-09 LAB — BASIC METABOLIC PANEL
BUN/Creatinine Ratio: 14 (ref 12–28)
BUN: 19 mg/dL (ref 8–27)
CO2: 25 mmol/L (ref 20–29)
Calcium: 9.8 mg/dL (ref 8.7–10.3)
Chloride: 102 mmol/L (ref 96–106)
Creatinine, Ser: 1.37 mg/dL — ABNORMAL HIGH (ref 0.57–1.00)
GFR calc Af Amer: 42 mL/min/{1.73_m2} — ABNORMAL LOW (ref >59)
GFR calc non Af Amer: 36 mL/min/{1.73_m2} — ABNORMAL LOW (ref >59)
Glucose: 93 mg/dL (ref 65–99)
Potassium: 4.9 mmol/L (ref 3.5–5.2)
Sodium: 140 mmol/L (ref 134–144)

## 2019-09-09 LAB — NOVEL CORONAVIRUS, NAA (HOSP ORDER, SEND-OUT TO REF LAB; TAT 18-24 HRS): SARS-CoV-2, NAA: NOT DETECTED

## 2019-09-11 ENCOUNTER — Telehealth: Payer: Self-pay | Admitting: *Deleted

## 2019-09-11 NOTE — Telephone Encounter (Signed)
I have sent message to Coletta Memos, NP that I have made arrangements for 4 hours pre procedure hydration.

## 2019-09-11 NOTE — Telephone Encounter (Signed)
Pt contacted pre-catheterization scheduled at [facility] for: Tuesday September 12, 2019 1:30 PM Verified arrival time and place: [facility]) at: 8:30 AM-pre procedure hydration   No solid food after midnight prior to cath, clear liquids until 5 AM day of procedure. Contrast allergy: no-pt confirmed no contrast allergy.   AM meds can be  taken pre-cath with sip of water including: ASA 81 mg Plavix 75 mg  Confirmed patient has responsible adult to drive home post procedure and observe 24 hours after arriving home: yes  Currently, due to Covid-19 pandemic, only one support person will be allowed with patient. Must be the same support person for that patient's entire stay, will be screened and required to wear a mask. They will be asked to wait in the waiting room for the duration of the patient's stay.  Patients are required to wear a mask when they enter the hospital.      COVID-19 Pre-Screening Questions:  . In the past 7 to 10 days have you had a cough,  shortness of breath, headache, congestion, fever (100 or greater) body aches, chills, sore throat, or sudden loss of taste or sense of smell? no . Have you been around anyone with known Covid 19? no . Have you been around anyone who is awaiting Covid 19 test results in the past 7 to 10 days? no . Have you been around anyone who has been exposed to Covid 19, or has mentioned symptoms of Covid 19 within the past 7 to 10 days? no  I reviewed procedure/mask/visitor, Covid-19 screening questions with patient, she verbalized understanding, thanked me for call.

## 2019-09-12 ENCOUNTER — Encounter (HOSPITAL_COMMUNITY)
Admission: RE | Disposition: A | Discharge status: Home / Self Care | Payer: Self-pay | Source: Home / Self Care | Attending: Cardiovascular Disease

## 2019-09-12 ENCOUNTER — Other Ambulatory Visit: Payer: Self-pay

## 2019-09-12 ENCOUNTER — Ambulatory Visit (HOSPITAL_COMMUNITY)
Admission: RE | Admit: 2019-09-12 | Discharge: 2019-09-12 | Disposition: A | Discharge status: Home / Self Care | Payer: Medicare Other | Attending: Cardiovascular Disease | Admitting: Cardiovascular Disease

## 2019-09-12 DIAGNOSIS — Z79899 Other long term (current) drug therapy: Secondary | ICD-10-CM | POA: Diagnosis not present

## 2019-09-12 DIAGNOSIS — I2510 Atherosclerotic heart disease of native coronary artery without angina pectoris: Secondary | ICD-10-CM

## 2019-09-12 DIAGNOSIS — Z955 Presence of coronary angioplasty implant and graft: Secondary | ICD-10-CM | POA: Insufficient documentation

## 2019-09-12 DIAGNOSIS — I714 Abdominal aortic aneurysm, without rupture: Secondary | ICD-10-CM | POA: Insufficient documentation

## 2019-09-12 DIAGNOSIS — K219 Gastro-esophageal reflux disease without esophagitis: Secondary | ICD-10-CM | POA: Insufficient documentation

## 2019-09-12 DIAGNOSIS — Z79890 Hormone replacement therapy (postmenopausal): Secondary | ICD-10-CM | POA: Diagnosis not present

## 2019-09-12 DIAGNOSIS — Z823 Family history of stroke: Secondary | ICD-10-CM | POA: Diagnosis not present

## 2019-09-12 DIAGNOSIS — Z951 Presence of aortocoronary bypass graft: Secondary | ICD-10-CM | POA: Diagnosis not present

## 2019-09-12 DIAGNOSIS — Z7902 Long term (current) use of antithrombotics/antiplatelets: Secondary | ICD-10-CM | POA: Insufficient documentation

## 2019-09-12 DIAGNOSIS — M1990 Unspecified osteoarthritis, unspecified site: Secondary | ICD-10-CM | POA: Diagnosis not present

## 2019-09-12 DIAGNOSIS — Z7982 Long term (current) use of aspirin: Secondary | ICD-10-CM | POA: Diagnosis not present

## 2019-09-12 DIAGNOSIS — Z87891 Personal history of nicotine dependence: Secondary | ICD-10-CM | POA: Diagnosis not present

## 2019-09-12 DIAGNOSIS — R9439 Abnormal result of other cardiovascular function study: Secondary | ICD-10-CM

## 2019-09-12 DIAGNOSIS — I252 Old myocardial infarction: Secondary | ICD-10-CM | POA: Diagnosis not present

## 2019-09-12 DIAGNOSIS — I2582 Chronic total occlusion of coronary artery: Secondary | ICD-10-CM | POA: Diagnosis not present

## 2019-09-12 DIAGNOSIS — Z8249 Family history of ischemic heart disease and other diseases of the circulatory system: Secondary | ICD-10-CM | POA: Insufficient documentation

## 2019-09-12 DIAGNOSIS — I10 Essential (primary) hypertension: Secondary | ICD-10-CM | POA: Insufficient documentation

## 2019-09-12 DIAGNOSIS — E039 Hypothyroidism, unspecified: Secondary | ICD-10-CM | POA: Diagnosis not present

## 2019-09-12 DIAGNOSIS — I739 Peripheral vascular disease, unspecified: Secondary | ICD-10-CM | POA: Insufficient documentation

## 2019-09-12 DIAGNOSIS — E7800 Pure hypercholesterolemia, unspecified: Secondary | ICD-10-CM | POA: Insufficient documentation

## 2019-09-12 HISTORY — PX: LEFT HEART CATH AND CORS/GRAFTS ANGIOGRAPHY: CATH118250

## 2019-09-12 SURGERY — LEFT HEART CATH AND CORS/GRAFTS ANGIOGRAPHY
Anesthesia: LOCAL

## 2019-09-12 MED ORDER — HEPARIN (PORCINE) IN NACL 1000-0.9 UT/500ML-% IV SOLN
INTRAVENOUS | Status: DC | PRN
  Administered 2019-09-12 (×2): 500 mL

## 2019-09-12 MED ORDER — LIDOCAINE HCL (PF) 1 % IJ SOLN
INTRAMUSCULAR | Status: AC
  Filled 2019-09-12: qty 30

## 2019-09-12 MED ORDER — SODIUM CHLORIDE 0.9 % IV SOLN: 250.0000 mL | INTRAVENOUS | Status: DC | PRN

## 2019-09-12 MED ORDER — ACETAMINOPHEN 325 MG PO TABS: 650.0000 mg | ORAL_TABLET | ORAL | Status: DC | PRN

## 2019-09-12 MED ORDER — SODIUM CHLORIDE 0.9 % WEIGHT BASED INFUSION: 1.0000 mL/kg/h | INTRAVENOUS | Status: DC

## 2019-09-12 MED ORDER — ATORVASTATIN CALCIUM 40 MG PO TABS: 40.0000 mg | ORAL_TABLET | Freq: Every day | ORAL | Status: DC

## 2019-09-12 MED ORDER — SODIUM CHLORIDE 0.9% FLUSH: 3.0000 mL | Freq: Two times a day (BID) | INTRAVENOUS | Status: DC

## 2019-09-12 MED ORDER — CLOPIDOGREL BISULFATE 75 MG PO TABS: 75.0000 mg | ORAL_TABLET | Freq: Every day | ORAL | Status: DC

## 2019-09-12 MED ORDER — ONDANSETRON HCL 4 MG/2ML IJ SOLN: 4.0000 mg | Freq: Four times a day (QID) | INTRAMUSCULAR | Status: DC | PRN

## 2019-09-12 MED ORDER — FENTANYL CITRATE (PF) 100 MCG/2ML IJ SOLN
INTRAMUSCULAR | Status: DC | PRN
  Administered 2019-09-12 (×2): 25 ug via INTRAVENOUS

## 2019-09-12 MED ORDER — HEPARIN (PORCINE) IN NACL 1000-0.9 UT/500ML-% IV SOLN
INTRAVENOUS | Status: AC
  Filled 2019-09-12: qty 1000

## 2019-09-12 MED ORDER — MIDAZOLAM HCL 2 MG/2ML IJ SOLN
INTRAMUSCULAR | Status: AC
  Filled 2019-09-12: qty 2

## 2019-09-12 MED ORDER — FENTANYL CITRATE (PF) 100 MCG/2ML IJ SOLN
INTRAMUSCULAR | Status: AC
  Filled 2019-09-12: qty 2

## 2019-09-12 MED ORDER — LABETALOL HCL 5 MG/ML IV SOLN: 10.0000 mg | INTRAVENOUS | Status: DC | PRN

## 2019-09-12 MED ORDER — SODIUM CHLORIDE 0.9% FLUSH: 3.0000 mL | INTRAVENOUS | Status: DC | PRN

## 2019-09-12 MED ORDER — SODIUM CHLORIDE 0.9 % WEIGHT BASED INFUSION
3.0000 mL/kg/h | INTRAVENOUS | Status: AC
  Administered 2019-09-12: 3 mL/kg/h via INTRAVENOUS

## 2019-09-12 MED ORDER — MIDAZOLAM HCL 2 MG/2ML IJ SOLN
INTRAMUSCULAR | Status: DC | PRN
  Administered 2019-09-12 (×2): 1 mg via INTRAVENOUS

## 2019-09-12 MED ORDER — IOHEXOL 350 MG/ML SOLN
INTRAVENOUS | Status: DC | PRN
  Administered 2019-09-12: 13:00:00 90 mL via INTRA_ARTERIAL

## 2019-09-12 MED ORDER — ASPIRIN 81 MG PO CHEW: 81.0000 mg | CHEWABLE_TABLET | Freq: Every day | ORAL | Status: DC

## 2019-09-12 MED ORDER — SODIUM CHLORIDE 0.9 % IV SOLN: INTRAVENOUS | Status: DC

## 2019-09-12 MED ORDER — HYDRALAZINE HCL 20 MG/ML IJ SOLN: 10.0000 mg | INTRAMUSCULAR | Status: DC | PRN

## 2019-09-12 MED ORDER — LIDOCAINE HCL (PF) 1 % IJ SOLN
INTRAMUSCULAR | Status: DC | PRN
  Administered 2019-09-12: 30 mL via INTRADERMAL

## 2019-09-12 SURGICAL SUPPLY — 9 items
CATH DXT MULTI JL4 JR4 ANG PIG (CATHETERS) ×1 IMPLANT
CATH INFINITI 5 FR IM (CATHETERS) ×1 IMPLANT
KIT HEART LEFT (KITS) ×2 IMPLANT
PACK CARDIAC CATHETERIZATION (CUSTOM PROCEDURE TRAY) ×2 IMPLANT
SHEATH PINNACLE 5F 10CM (SHEATH) ×1 IMPLANT
TRANSDUCER W/STOPCOCK (MISCELLANEOUS) ×2 IMPLANT
TUBING CIL FLEX 10 FLL-RA (TUBING) ×2 IMPLANT
WIRE EMERALD 3MM-J .035X150CM (WIRE) ×1 IMPLANT
WIRE EMERALD 3MM-J .035X260CM (WIRE) ×1 IMPLANT

## 2019-09-12 NOTE — Discharge Instructions (Signed)

## 2019-09-12 NOTE — Interval H&P Note (Signed)
Cath Lab Visit (complete for each Cath Lab visit)  Clinical Evaluation Leading to the Procedure:   ACS: No.  Non-ACS:    Anginal Classification: CCS II  Anti-ischemic medical therapy: Minimal Therapy (1 class of medications)  Non-Invasive Test Results: Intermediate-risk stress test findings: cardiac mortality 1-3%/year  Prior CABG: Previous CABG      History and Physical Interval Note:  09/12/2019 12:39 PM  Jessica Taylor  has presented today for surgery, with the diagnosis of Abnormal stress test.  The various methods of treatment have been discussed with the patient and family. After consideration of risks, benefits and other options for treatment, the patient has consented to  Procedure(s): LEFT HEART CATH AND CORONARY ANGIOGRAPHY (N/A) as a surgical intervention.  The patient's history has been reviewed, patient examined, no change in status, stable for surgery.  I have reviewed the patient's chart and labs.  Questions were answered to the patient's satisfaction.     Shelva Majestic

## 2019-09-13 ENCOUNTER — Encounter (HOSPITAL_COMMUNITY): Payer: Self-pay | Admitting: Cardiovascular Disease

## 2019-09-19 ENCOUNTER — Telehealth: Payer: Self-pay | Admitting: Cardiology

## 2019-09-19 NOTE — Telephone Encounter (Signed)
Called the patient Jessica Taylor and informed her that she is to hold her Plavix for 5 days prior to surgery/procedure and to restart it as soon as possible at the surgeons discretion. Also told her the recommendation of continuing her ASA through the surgery. Patient Mrs. Jessica Taylor verbalized an understanding and all (if any) questions were answered.

## 2019-09-19 NOTE — Telephone Encounter (Signed)
   Primary Cardiologist: Candee Furbish, MD  Chart reviewed as part of pre-operative protocol coverage. Patient was contacted 09/19/2019 in reference to pre-operative risk assessment for pending surgery as outlined below.  Jessica Taylor was last seen on 08/29/2019 by Coletta Memos. Cardiac catheterization was arranged on 09/12/2019, this revealed stable coronary artery anatomy.   Therefore, based on ACC/AHA guidelines, the patient would be at acceptable risk for the planned procedure without further cardiovascular testing.   I will route this recommendation to the requesting party via Epic fax function and remove from pre-op pool.  Please call with questions. She may hold plavix for 5 days prior to the surgery then restart as soon as possible afterward at the surgeon's discretion. Given the significant underlying disease, we recommend continue aspirin through the surgery.   [place], [place] 09/19/2019, 10:46 AM

## 2019-09-19 NOTE — Telephone Encounter (Signed)
No message needed °

## 2019-09-27 ENCOUNTER — Other Ambulatory Visit: Payer: Self-pay | Admitting: Nurse Practitioner

## 2019-09-30 ENCOUNTER — Other Ambulatory Visit: Payer: Self-pay | Admitting: Nurse Practitioner

## 2019-10-02 ENCOUNTER — Other Ambulatory Visit: Payer: Self-pay

## 2019-10-02 MED ORDER — METOPROLOL TARTRATE 25 MG PO TABS: 25.0000 mg | ORAL_TABLET | Freq: Two times a day (BID) | ORAL | 3 refills | Status: DC

## 2019-10-07 ENCOUNTER — Other Ambulatory Visit (HOSPITAL_COMMUNITY)
Admission: RE | Admit: 2019-10-07 | Discharge: 2019-10-07 | Disposition: A | Discharge status: Home / Self Care | Payer: Medicare Other | Source: Ambulatory Visit | Attending: Surgery | Admitting: Surgery

## 2019-10-07 DIAGNOSIS — Z01812 Encounter for preprocedural laboratory examination: Secondary | ICD-10-CM | POA: Insufficient documentation

## 2019-10-07 DIAGNOSIS — Z20828 Contact with and (suspected) exposure to other viral communicable diseases: Secondary | ICD-10-CM | POA: Diagnosis not present

## 2019-10-08 LAB — NOVEL CORONAVIRUS, NAA (HOSP ORDER, SEND-OUT TO REF LAB; TAT 18-24 HRS): SARS-CoV-2, NAA: NOT DETECTED

## 2019-10-09 NOTE — Patient Instructions (Addendum)
DUE TO COVID-19 ONLY ONE VISITOR IS ALLOWED TO COME WITH YOU AND STAY IN THE WAITING ROOM ONLY DURING PRE OP AND PROCEDURE DAY OF SURGERY. THE 1 VISITOR MAY VISIT WITH YOU AFTER SURGERY IN YOUR PRIVATE ROOM DURING VISITING HOURS ONLY!  YOU HAD  A COVID 19 TEST ON  10-09-2019. ONCE YOUR COVID TEST IS COMPLETED, PLEASE BEGIN THE QUARANTINE INSTRUCTIONS AS OUTLINED IN YOUR HANDOUT.                Jessica Taylor    Your procedure is scheduled on: 10-11-2019   Report to [facility] Main  Entrance    Report to admitting at  645 AM     Call this number if you have problems the morning of surgery [phone]    Remember: [place], NO [place].  DRINK 2 PRESURGERY ENSURE DRINKS THE NIGHT BEFORE SURGERY AT  1000 PM AND 1 PRESURGERY DRINK THE DAY OF THE PROCEDURE AT 545 AM. NOTHING BY MOUTH AFTER 545 AM DAY OF SURGERY.  FOLLOW ALL BOWEL PREP INSTRUCTIONS FROM DR GROSS WITH CLEAR LIQUIDS ALL DAY Tuesday 10-10-2019.  CLEAR LIQUID DIET   Foods Allowed                                                                     Foods Excluded  Coffee and tea, regular and decaf                             liquids that you cannot  Plain Jell-O any favor except red or purple              see through such as: Fruit ices (not with fruit pulp)                                     milk, soups, orange juice  Iced Popsicles                                    All solid food Carbonated beverages, regular and diet                                    Cranberry, grape and apple juices Sports drinks like Gatorade Lightly seasoned clear broth or consume(fat free) Sugar, honey syrup  Sample Menu Breakfast                                Lunch                                     Supper Cranberry juice                    Beef broth  Chicken broth Jell-O                                     Grape juice                            Apple juice Coffee or tea                        Jell-O                                      Popsicle                                                Coffee or tea                        Coffee or tea  _____________________________________________________________________     Take these medicines the morning of surgery with A SIP OF WATER: ATORVASTATIN (LIPITOR), METORPOLOL TARTRATE, LEVOTHYROXINE, NEXIUM                                 You may not have any metal on your body including hair pins and              piercings  Do not wear jewelry, make-up, lotions, powders or perfumes, deodorant             Do not wear nail polish on your fingernails.  Do not shave  48 hours prior to surgery.              Do not bring valuables to the hospital. [place].  Contacts, dentures or bridgework may not be worn into surgery.  Leave suitcase in the car. After surgery it may be brought to your room.               Please read over the following fact sheets you were given: _____________________________________________________________________             [facility] - Preparing for Surgery Before surgery, you can play an important role.  Because skin is not sterile, your skin needs to be as free of germs as possible.  You can reduce the number of germs on your skin by washing with CHG (chlorahexidine gluconate) soap before surgery.  CHG is an antiseptic cleaner which kills germs and bonds with the skin to continue killing germs even after washing. Please DO NOT use if you have an allergy to CHG or antibacterial soaps.  If your skin becomes reddened/irritated stop using the CHG and inform your nurse when you arrive at Short Stay. Do not shave (including legs and underarms) for at least 48 hours prior to the first CHG shower.  You may shave your face/neck. Please follow these instructions carefully:  1.  Shower with CHG Soap the night before  surgery and the  morning of Surgery.  2.  If you choose to wash  your hair, wash your hair first as usual with your  normal  shampoo.  3.  After you shampoo, rinse your hair and body thoroughly to remove the  shampoo.                             4.  Use CHG as you would any other liquid soap.  You can apply chg directly  to the skin and wash                       Gently with a scrungie or clean washcloth.  5.  Apply the CHG Soap to your body ONLY FROM THE NECK DOWN.   Do not use on face/ open                           Wound or open sores. Avoid contact with eyes, ears mouth and genitals (private parts).                       Wash face,  Genitals (private parts) with your normal soap.             6.  Wash thoroughly, paying special attention to the area where your surgery  will be performed.  7.  Thoroughly rinse your body with warm water from the neck down.  8.  DO NOT shower/wash with your normal soap after using and rinsing off  the CHG Soap.                9.  Pat yourself dry with a clean towel.            10.  Wear clean pajamas.            11.  Place clean sheets on your bed the night of your first shower and do not  sleep with pets. Day of Surgery : Do not apply any lotions/deodorants the morning of surgery.  Please wear clean clothes to the hospital/surgery center.  FAILURE TO FOLLOW THESE INSTRUCTIONS MAY RESULT IN THE CANCELLATION OF YOUR SURGERY PATIENT SIGNATURE_________________________________  NURSE SIGNATURE__________________________________  ________________________________________________________________________

## 2019-10-10 ENCOUNTER — Encounter (HOSPITAL_COMMUNITY): Payer: Self-pay

## 2019-10-10 ENCOUNTER — Encounter (HOSPITAL_COMMUNITY)
Admission: RE | Admit: 2019-10-10 | Discharge: 2019-10-10 | Disposition: A | Discharge status: Home / Self Care | Payer: Medicare Other | Source: Ambulatory Visit | Attending: Surgery | Admitting: Surgery

## 2019-10-10 ENCOUNTER — Other Ambulatory Visit: Payer: Self-pay

## 2019-10-10 DIAGNOSIS — Z79890 Hormone replacement therapy (postmenopausal): Secondary | ICD-10-CM | POA: Insufficient documentation

## 2019-10-10 DIAGNOSIS — Z7982 Long term (current) use of aspirin: Secondary | ICD-10-CM | POA: Insufficient documentation

## 2019-10-10 DIAGNOSIS — E7800 Pure hypercholesterolemia, unspecified: Secondary | ICD-10-CM | POA: Insufficient documentation

## 2019-10-10 DIAGNOSIS — Z87891 Personal history of nicotine dependence: Secondary | ICD-10-CM | POA: Insufficient documentation

## 2019-10-10 DIAGNOSIS — I252 Old myocardial infarction: Secondary | ICD-10-CM | POA: Insufficient documentation

## 2019-10-10 DIAGNOSIS — C189 Malignant neoplasm of colon, unspecified: Secondary | ICD-10-CM | POA: Insufficient documentation

## 2019-10-10 DIAGNOSIS — Z01812 Encounter for preprocedural laboratory examination: Secondary | ICD-10-CM | POA: Insufficient documentation

## 2019-10-10 DIAGNOSIS — Z7902 Long term (current) use of antithrombotics/antiplatelets: Secondary | ICD-10-CM | POA: Insufficient documentation

## 2019-10-10 DIAGNOSIS — Z79899 Other long term (current) drug therapy: Secondary | ICD-10-CM | POA: Insufficient documentation

## 2019-10-10 DIAGNOSIS — E039 Hypothyroidism, unspecified: Secondary | ICD-10-CM | POA: Insufficient documentation

## 2019-10-10 DIAGNOSIS — M1710 Unilateral primary osteoarthritis, unspecified knee: Secondary | ICD-10-CM | POA: Insufficient documentation

## 2019-10-10 DIAGNOSIS — I739 Peripheral vascular disease, unspecified: Secondary | ICD-10-CM | POA: Insufficient documentation

## 2019-10-10 DIAGNOSIS — K219 Gastro-esophageal reflux disease without esophagitis: Secondary | ICD-10-CM | POA: Insufficient documentation

## 2019-10-10 DIAGNOSIS — Z96651 Presence of right artificial knee joint: Secondary | ICD-10-CM | POA: Insufficient documentation

## 2019-10-10 DIAGNOSIS — I10 Essential (primary) hypertension: Secondary | ICD-10-CM | POA: Insufficient documentation

## 2019-10-10 DIAGNOSIS — M19011 Primary osteoarthritis, right shoulder: Secondary | ICD-10-CM | POA: Insufficient documentation

## 2019-10-10 DIAGNOSIS — I25810 Atherosclerosis of coronary artery bypass graft(s) without angina pectoris: Secondary | ICD-10-CM | POA: Insufficient documentation

## 2019-10-10 DIAGNOSIS — Z85828 Personal history of other malignant neoplasm of skin: Secondary | ICD-10-CM | POA: Insufficient documentation

## 2019-10-10 DIAGNOSIS — Z951 Presence of aortocoronary bypass graft: Secondary | ICD-10-CM | POA: Insufficient documentation

## 2019-10-10 LAB — HEMOGLOBIN A1C
Hgb A1c MFr Bld: 5.6 % (ref 4.8–5.6)
Mean Plasma Glucose: 114.02 mg/dL

## 2019-10-10 LAB — CBC
HCT: 41.4 % (ref 36.0–46.0)
Hemoglobin: 11.9 g/dL — ABNORMAL LOW (ref 12.0–15.0)
MCH: 25.1 pg — ABNORMAL LOW (ref 26.0–34.0)
MCHC: 28.7 g/dL — ABNORMAL LOW (ref 30.0–36.0)
MCV: 87.3 fL (ref 80.0–100.0)
Platelets: 285 10*3/uL (ref 150–400)
RBC: 4.74 MIL/uL (ref 3.87–5.11)
RDW: 17.7 % — ABNORMAL HIGH (ref 11.5–15.5)
WBC: 9.2 10*3/uL (ref 4.0–10.5)
nRBC: 0 % (ref 0.0–0.2)

## 2019-10-10 LAB — BASIC METABOLIC PANEL
Anion gap: 9 (ref 5–15)
BUN: 16 mg/dL (ref 8–23)
CO2: 27 mmol/L (ref 22–32)
Calcium: 9.8 mg/dL (ref 8.9–10.3)
Chloride: 105 mmol/L (ref 98–111)
Creatinine, Ser: 1.42 mg/dL — ABNORMAL HIGH (ref 0.44–1.00)
GFR calc Af Amer: 40 mL/min — ABNORMAL LOW (ref >60)
GFR calc non Af Amer: 35 mL/min — ABNORMAL LOW (ref >60)
Glucose, Bld: 114 mg/dL — ABNORMAL HIGH (ref 70–99)
Potassium: 4 mmol/L (ref 3.5–5.1)
Sodium: 141 mmol/L (ref 135–145)

## 2019-10-10 MED ORDER — BUPIVACAINE LIPOSOME 1.3 % IJ SUSP
20.0000 mL | Freq: Once | INTRAMUSCULAR | Status: DC
  Filled 2019-10-10: qty 20

## 2019-10-10 MED ORDER — SODIUM CHLORIDE 0.9 % IV SOLN
INTRAVENOUS | Status: DC
  Filled 2019-10-10: qty 6

## 2019-10-10 NOTE — Progress Notes (Signed)
Anesthesia Chart Review   Case: [id number] Date/Time: 10/11/19 0815   Procedure: XI ROBOT ASSISTED PROXIMAL COLECTOMY (N/A )   Anesthesia type: General   Pre-op diagnosis: COLON CANCER   [facility] ROOM 02 / [facility]   Surgeon: Michael Boston, MD      DISCUSSION:78 y.o. former smoker (79 pack years, quit 05/19/99) with h/o hypothyroidism, HTN, PAD, CAD (CABG 01/24/2016), colon cancer scheduled for above procedure 10/11/2019 with Dr. Michael Boston.   Pt seen by cardiology 08/29/2019 for preoperative evaluation.  Per OV note, "Preop cardiac evaluation for- robotic proximal colectomy surgery.Patient statesshe is unable to walk up 2 flights of stairs or walk 4 city blocksandback without lower extremity pain. A Lexiscan Myoview stress test will need to be conducted before surgery to give an accurate cardiac evaluation.  She is not cleared for surgery at this time.  Cardiac clearance pending stress test results."   Intermediate risk nuclear perfusion study 09/01/2019.  Subsequent cardiac cath 09/12/2019, per results, "Patient has been maintained on long-term dual antiplatelet therapy with aspirin and Plavix.  All grafts are widely patent.  Medical therapy for concomitant native CAD.  Continue aggressive lipid-lowering therapy with target LDL less than 70.  Clearance will be given for the patient's future robotic colectomy but she will need to hold Plavix for at least 5 days prior to the procedure."   Anticipate pt can proceed with planned procedure barring acute status change.   VS: BP (!) 146/92   Pulse 90   Temp 36.6 C (Oral)   Resp 18   Ht 5\' 3"  (1.6 m)   Wt 66 kg   SpO2 100%   BMI 25.77 kg/m   PROVIDERS: Antony Contras, MD is PCP   Candee Furbish, MD is Cardiologist  LABS: Labs reviewed: Acceptable for surgery. (all labs ordered are listed, but only abnormal results are displayed)  Labs Reviewed  CBC - Abnormal; Notable for the following components:      Result Value   Hemoglobin  11.9 (*)    MCH 25.1 (*)    MCHC 28.7 (*)    RDW 17.7 (*)    All other components within normal limits  BASIC METABOLIC PANEL  HEMOGLOBIN A1C     IMAGES:   EKG: 08/29/2019 Rate 52 bpm Sinus bradycardia Left bundle branch block   CV: Cardiac Cath 09/12/2019  Dist RCA lesion is 100% stenosed.  Prox RCA lesion is 40% stenosed.  Mid RCA lesion is 50% stenosed.  Mid RCA to Dist RCA lesion is 90% stenosed.  RV Branch-1 lesion is 50% stenosed.  RV Branch-2 lesion is 60% stenosed.  Previously placed Mid Cx to Dist Cx stent (unknown type) is widely patent.  Ramus lesion is 100% stenosed.  Ost LAD to Prox LAD lesion is 50% stenosed.  Prox LAD to Mid LAD lesion is 85% stenosed.  Mid LAD lesion is 80% stenosed.   Significant multivessel native CAD with previous stenting in the proximal to mid LAD with diffuse 50% narrowing in the proximal stent followed by an area of 70 to 85% stenosis extending into the proximal second stent with competitive filling of the LAD via the LIMA graft.    Total occlusion of the proximal ramus intermediate vessel at the site of prior stenting.  Widely patent mid left circumflex stent without significant additional stenoses in the circumflex vessel.  Bleeding irregular proximal to mid RCA with total occlusion of the RCA in the region of the acute margin at the site of  prior distal stenting and diffuse 50 and 60% stenoses in a marginal branch.  Patent LIMA to mid LAD.  Patent SVG to the ramus intermediate vessel.  Patent SVG to the PDA.  Low normal global LV function with EF estimate approximately 50%.  LVEDP 6 mmHg.  RECOMMENDATION: Patient has been maintained on long-term dual antiplatelet therapy with aspirin and Plavix.  All grafts are widely patent.  Medical therapy for concomitant native CAD.  Continue aggressive lipid-lowering therapy with target LDL less than 70.  Clearance will be given for the patient's future robotic  colectomy but she will need to hold Plavix for at least 5 days prior to the procedure.  Myocardial Perfusion 09/01/2019  Nuclear stress EF: 43%.  The left ventricular ejection fraction is moderately decreased (30-44%).  Defect 1: There is a medium defect of moderate severity present in the basal inferolateral and mid inferolateral location.  Defect 2: There is a small defect of moderate severity present in the basal inferoseptal location.   Intermediate risk nuclear perfusion study. There is a moderate area of mixed ischemia and scar in the inferolateral wall. There is a smaller area of scar in the inferior septum. Left ventricular systolic function is mild-moderately reduced. There are new findings since the study in 2016. Past Medical History:  Diagnosis Date  . Abdominal aortic aneurysm ([place])   . Acid reflux   . Anemia   . Arthritis    knees , R shoulder - tx /w injection - 11/2014  . Basal cell carcinoma (BCC) of dorsum of nose 2010   Resolved  . Cancer ([place])    basal cell on nose  . Coronary artery disease   . Hypercholesteremia   . Hypertension   . Hypothyroidism   . MI, old 44  . Peripheral arterial disease (HCC)    hhigh-grade ostial bilateral calcified iliac stenosis with claudication  . Tobacco abuse   . Vertigo    when lays on left side.    Past Surgical History:  Procedure Laterality Date  . CARDIAC CATHETERIZATION  5 stents  . CARDIAC CATHETERIZATION N/A 01/20/2016   Procedure: Left Heart Cath and Coronary Angiography;  Surgeon: Peter M Martinique, MD;  Location: [place] CV LAB;  Service: Cardiovascular;  Laterality: N/A;  . CATARACT EXTRACTION W/PHACO Left 05/24/2014   Procedure: CATARACT EXTRACTION PHACO AND INTRAOCULAR LENS PLACEMENT (IOC);  Surgeon: Tonny Branch, MD;  Location[facility];  Service: Ophthalmology;  Laterality: Left;  CDE:  9.30  . CATARACT EXTRACTION W/PHACO Right 06/18/2014   Procedure: CATARACT EXTRACTION PHACO AND INTRAOCULAR LENS PLACEMENT  RIGHT EYE CDE=10.84;  Surgeon: Tonny Branch, MD;  Location: [facility];  Service: Ophthalmology;  Laterality: Right;  . CORONARY ARTERY BYPASS GRAFT N/A 01/24/2016   Procedure: CORONARY ARTERY BYPASS GRAFTING (CABG);  Surgeon: Gaye Pollack, MD;  Location: [place];  Service: Open Heart Surgery;  Laterality: N/A;  . CORONARY STENT PLACEMENT    . CORONARY STENT PLACEMENT  03/06/15   CFX DES  . CYSTOSCOPY/URETEROSCOPY/HOLMIUM LASER/STENT PLACEMENT Left 09/12/2018   Procedure: CYSTOSCOPY/URETEROSCOPY/STENT PLACEMENT;  Surgeon: Ceasar Mons, MD;  [facility];  Service: Urology;  Laterality: Left;  . EYE SURGERY    . LEFT HEART CATH AND CORS/GRAFTS ANGIOGRAPHY N/A 09/12/2019   Procedure: LEFT HEART CATH AND CORS/GRAFTS ANGIOGRAPHY;  Surgeon: Troy Sine, MD;  Location: [place] CV LAB;  Service: Cardiovascular;  Laterality: N/A;  . LEFT HEART CATHETERIZATION WITH CORONARY ANGIOGRAM N/A 03/06/2015   Procedure: LEFT HEART CATHETERIZATION  WITH CORONARY ANGIOGRAM;  Surgeon: Lorretta Harp, MD;  [facility];  Service: Cardiovascular;  Laterality: N/A;  . PARTIAL KNEE ARTHROPLASTY Right 02/04/2015   Procedure: UNICOMPARTMENTAL KNEE;  Surgeon: Dorna Leitz, MD;  Location: [place];  Service: Orthopedics;  Laterality: Right;  . PERIPHERAL VASCULAR CATHETERIZATION Bilateral 05/13/2015   Procedure: Lower Extremity Angiography;  Surgeon: Lorretta Harp, MD;  Location: [place] CV LAB;  Service: Cardiovascular;  Laterality: Bilateral;  . PERIPHERAL VASCULAR CATHETERIZATION N/A 05/13/2015   Procedure: Abdominal Aortogram;  Surgeon: Lorretta Harp, MD;  Location: [place] CV LAB;  Service: Cardiovascular;  Laterality: N/A;  . PERIPHERAL VASCULAR CATHETERIZATION Bilateral 06/27/2015   Procedure: Peripheral Vascular Intervention;  Surgeon: Lorretta Harp, MD;  Location: [place] CV LAB;  Service: Cardiovascular;  Laterality: Bilateral;  ILIACS  . PERIPHERAL VASCULAR CATHETERIZATION  Bilateral 06/27/2015   Procedure: Peripheral Vascular Atherectomy;  Surgeon: Lorretta Harp, MD;  Location: [place] CV LAB;  Service: Cardiovascular;  Laterality: Bilateral;  . TEE WITHOUT CARDIOVERSION N/A 01/24/2016   Procedure: TRANSESOPHAGEAL ECHOCARDIOGRAM (TEE);  Surgeon: Gaye Pollack, MD;  Location: [place];  Service: Open Heart Surgery;  Laterality: N/A;  . TONSILLECTOMY     age 68  . TUBAL LIGATION      MEDICATIONS: . aspirin EC 81 MG tablet  . atorvastatin (LIPITOR) 40 MG tablet  . clopidogrel (PLAVIX) 75 MG tablet  . esomeprazole (NEXIUM) 20 MG capsule  . HYDROcodone-acetaminophen (NORCO/VICODIN) 5-325 MG tablet  . levothyroxine (SYNTHROID, LEVOTHROID) 50 MCG tablet  . metoprolol tartrate (LOPRESSOR) 25 MG tablet  . nitroGLYCERIN (NITROSTAT) 0.4 MG SL tablet   No current facility-administered medications for this encounter.    Derrill Memo ON 10/11/2019] bupivacaine liposome (EXPAREL) 1.3 % injection 266 mg  . [START ON 10/11/2019] clindamycin (CLEOCIN) 900 mg, gentamicin (GARAMYCIN) 240 mg in sodium chloride 0.9 % 1,000 mL for intraperitoneal lavage   Maia Plan [facility] Pre-Surgical Testing [phone] 10/10/19  4:12 PM

## 2019-10-10 NOTE — Progress Notes (Signed)
PCP - Marijo File MD Cardiologist - Dr. Marlou Porch MD  Chest x-ray -  EKG - 08/29/2019 epic Stress Test - 09/01/2019 epic ECHO - 01/17/2016 epic Cardiac Cath - 09/12/2019  Chest CT- 08/09/2019 Sleep Study -  CPAP -   Fasting Blood Sugar -  Checks Blood Sugar _____ times a day  Blood Thinner Instructions: Plavix: hold 5 days per Wray Kearns PA  Aspirin Instructions: 81mg , Pt stated that she was instructed to continue taking her aspirin.  Last Dose:  Anesthesia review: Chart given to Konrad Felix PA for review  Patient denies shortness of breath, fever, cough and chest pain at PAT appointment   Patient verbalized understanding of instructions that were given to them at the PAT appointment. Patient was also instructed that they will need to review over the PAT instructions again at home before surgery.

## 2019-10-10 NOTE — Consult Note (Signed)
[place] Nurse requested for preoperative stoma site marking  Discussed surgical procedure and stoma creation with patient and family.  Explained role of the [place] nurse team.  Provided the patient with educational booklet and provided samples of pouching options.  Answered patient questions.   Examined patient lying, sitting, and standing in order to place the marking in the patient's visual field, away from any creases or abdominal contour issues and within the rectus muscle.  Attempted to mark below the patient's belt line.   Marked for colostomy / ileostomy in the LLQ  __6__ cm to the left and right of the umbilicus and _4___QP below the umbilicus.  Patient's abdomen cleansed with CHG wipes at site markings, allowed to air dry prior to marking.Covered mark with thin film transparent dressing to preserve mark until date of surgery.   Val Riles, RN, MSN, CWOCN, CNS-BC, pager [phone]

## 2019-10-10 NOTE — Anesthesia Preprocedure Evaluation (Addendum)
Anesthesia Evaluation  Patient identified by MRN, date of birth, ID band Patient awake    Reviewed: Allergy & Precautions, NPO status , Patient's Chart, lab work & pertinent test results, reviewed documented beta blocker date and time   History of Anesthesia Complications Negative for: history of anesthetic complications  Airway Mallampati: II  TM Distance: >3 FB Neck ROM: Full    Dental  (+) Teeth Intact   Pulmonary neg pulmonary ROS, former smoker,    Pulmonary exam normal        Cardiovascular hypertension, Pt. on medications and Pt. on home beta blockers + CAD, + Past MI, + Cardiac Stents, + CABG (2017), + Peripheral Vascular Disease and +CHF  Normal cardiovascular exam     Neuro/Psych negative neurological ROS  negative psych ROS   GI/Hepatic Neg liver ROS, GERD  ,Colon cancer   Endo/Other  Hypothyroidism   Renal/GU Renal InsufficiencyRenal disease  negative genitourinary   Musculoskeletal negative musculoskeletal ROS (+)   Abdominal   Peds  Hematology  (+) anemia ,   Anesthesia Other Findings Intermediate risk nuclear perfusion study 09/01/2019.  Subsequent cardiac cath 09/12/2019, per results, "Patient has been maintained on long-term dual antiplatelet therapy with aspirin and Plavix.  All grafts are widely patent.  Medical therapy for concomitant native CAD.  Continue aggressive lipid-lowering therapy with target LDL less than 70.  Clearance will be given for the patient's future robotic colectomy but she will need to hold Plavix for at least 5 days prior to the procedure."   EF by cath 50%, EF by stress test 30-44%  Reproductive/Obstetrics                          Anesthesia Physical Anesthesia Plan  ASA: III  Anesthesia Plan: General   Post-op Pain Management:    Induction: Intravenous  PONV Risk Score and Plan: 4 or greater and Ondansetron, Dexamethasone and Treatment may  vary due to age or medical condition  Airway Management Planned: Oral ETT  Additional Equipment: None  Intra-op Plan:   Post-operative Plan: Extubation in OR  Informed Consent: I have reviewed the patients History and Physical, chart, labs and discussed the procedure including the risks, benefits and alternatives for the proposed anesthesia with the patient or authorized representative who has indicated his/her understanding and acceptance.     Dental advisory given  Plan Discussed with:   Anesthesia Plan Comments: (See PAT note 10/10/2019, Konrad Felix, PA-C)      Anesthesia Quick Evaluation

## 2019-10-11 ENCOUNTER — Inpatient Hospital Stay (HOSPITAL_COMMUNITY): Payer: Medicare Other | Admitting: Anesthesiology

## 2019-10-11 ENCOUNTER — Encounter (HOSPITAL_COMMUNITY): Payer: Self-pay

## 2019-10-11 ENCOUNTER — Inpatient Hospital Stay (HOSPITAL_COMMUNITY)
Admission: RE | Admit: 2019-10-11 | Discharge: 2019-10-13 | DRG: 330 | Disposition: A | Discharge status: Home / Self Care | Payer: Medicare Other | Attending: Surgery | Admitting: Surgery

## 2019-10-11 ENCOUNTER — Encounter (HOSPITAL_COMMUNITY)
Admission: RE | Disposition: A | Discharge status: Home / Self Care | Payer: Self-pay | Source: Home / Self Care | Attending: Surgery

## 2019-10-11 ENCOUNTER — Inpatient Hospital Stay (HOSPITAL_COMMUNITY): Payer: Medicare Other | Admitting: Physician Assistant

## 2019-10-11 DIAGNOSIS — Z961 Presence of intraocular lens: Secondary | ICD-10-CM | POA: Diagnosis present

## 2019-10-11 DIAGNOSIS — I714 Abdominal aortic aneurysm, without rupture: Secondary | ICD-10-CM | POA: Diagnosis present

## 2019-10-11 DIAGNOSIS — I7409 Other arterial embolism and thrombosis of abdominal aorta: Secondary | ICD-10-CM | POA: Diagnosis present

## 2019-10-11 DIAGNOSIS — C182 Malignant neoplasm of ascending colon: Secondary | ICD-10-CM | POA: Diagnosis present

## 2019-10-11 DIAGNOSIS — E039 Hypothyroidism, unspecified: Secondary | ICD-10-CM | POA: Diagnosis present

## 2019-10-11 DIAGNOSIS — I255 Ischemic cardiomyopathy: Secondary | ICD-10-CM | POA: Diagnosis present

## 2019-10-11 DIAGNOSIS — Z23 Encounter for immunization: Secondary | ICD-10-CM

## 2019-10-11 DIAGNOSIS — Z951 Presence of aortocoronary bypass graft: Secondary | ICD-10-CM

## 2019-10-11 DIAGNOSIS — Z9841 Cataract extraction status, right eye: Secondary | ICD-10-CM

## 2019-10-11 DIAGNOSIS — N183 Chronic kidney disease, stage 3 unspecified: Secondary | ICD-10-CM | POA: Diagnosis present

## 2019-10-11 DIAGNOSIS — Z8249 Family history of ischemic heart disease and other diseases of the circulatory system: Secondary | ICD-10-CM

## 2019-10-11 DIAGNOSIS — I2510 Atherosclerotic heart disease of native coronary artery without angina pectoris: Secondary | ICD-10-CM | POA: Diagnosis present

## 2019-10-11 DIAGNOSIS — Z85828 Personal history of other malignant neoplasm of skin: Secondary | ICD-10-CM | POA: Diagnosis not present

## 2019-10-11 DIAGNOSIS — Z79891 Long term (current) use of opiate analgesic: Secondary | ICD-10-CM

## 2019-10-11 DIAGNOSIS — I739 Peripheral vascular disease, unspecified: Secondary | ICD-10-CM | POA: Diagnosis present

## 2019-10-11 DIAGNOSIS — K219 Gastro-esophageal reflux disease without esophagitis: Secondary | ICD-10-CM | POA: Diagnosis present

## 2019-10-11 DIAGNOSIS — Z96651 Presence of right artificial knee joint: Secondary | ICD-10-CM | POA: Diagnosis present

## 2019-10-11 DIAGNOSIS — Z955 Presence of coronary angioplasty implant and graft: Secondary | ICD-10-CM

## 2019-10-11 DIAGNOSIS — I130 Hypertensive heart and chronic kidney disease with heart failure and stage 1 through stage 4 chronic kidney disease, or unspecified chronic kidney disease: Secondary | ICD-10-CM | POA: Diagnosis present

## 2019-10-11 DIAGNOSIS — Z9851 Tubal ligation status: Secondary | ICD-10-CM | POA: Diagnosis not present

## 2019-10-11 DIAGNOSIS — R42 Dizziness and giddiness: Secondary | ICD-10-CM | POA: Diagnosis present

## 2019-10-11 DIAGNOSIS — Z9842 Cataract extraction status, left eye: Secondary | ICD-10-CM

## 2019-10-11 DIAGNOSIS — E7800 Pure hypercholesterolemia, unspecified: Secondary | ICD-10-CM | POA: Diagnosis present

## 2019-10-11 DIAGNOSIS — Z79899 Other long term (current) drug therapy: Secondary | ICD-10-CM

## 2019-10-11 DIAGNOSIS — I10 Essential (primary) hypertension: Secondary | ICD-10-CM | POA: Diagnosis present

## 2019-10-11 DIAGNOSIS — I509 Heart failure, unspecified: Secondary | ICD-10-CM | POA: Diagnosis present

## 2019-10-11 DIAGNOSIS — D509 Iron deficiency anemia, unspecified: Secondary | ICD-10-CM | POA: Diagnosis present

## 2019-10-11 DIAGNOSIS — Z91013 Allergy to seafood: Secondary | ICD-10-CM | POA: Diagnosis not present

## 2019-10-11 DIAGNOSIS — Z8041 Family history of malignant neoplasm of ovary: Secondary | ICD-10-CM

## 2019-10-11 DIAGNOSIS — Z823 Family history of stroke: Secondary | ICD-10-CM | POA: Diagnosis not present

## 2019-10-11 DIAGNOSIS — Z79890 Hormone replacement therapy (postmenopausal): Secondary | ICD-10-CM

## 2019-10-11 DIAGNOSIS — Z20828 Contact with and (suspected) exposure to other viral communicable diseases: Secondary | ICD-10-CM | POA: Diagnosis present

## 2019-10-11 DIAGNOSIS — Z87891 Personal history of nicotine dependence: Secondary | ICD-10-CM

## 2019-10-11 DIAGNOSIS — E782 Mixed hyperlipidemia: Secondary | ICD-10-CM | POA: Diagnosis present

## 2019-10-11 DIAGNOSIS — Z7902 Long term (current) use of antithrombotics/antiplatelets: Secondary | ICD-10-CM

## 2019-10-11 DIAGNOSIS — I252 Old myocardial infarction: Secondary | ICD-10-CM

## 2019-10-11 LAB — TYPE AND SCREEN
ABO/RH(D): O POS
Antibody Screen: NEGATIVE

## 2019-10-11 LAB — ABO/RH: ABO/RH(D): O POS

## 2019-10-11 SURGERY — COLECTOMY, PARTIAL, ROBOT-ASSISTED, LAPAROSCOPIC
Anesthesia: General | Site: Abdomen

## 2019-10-11 MED ORDER — INDOCYANINE GREEN 25 MG IV SOLR
INTRAVENOUS | Status: DC | PRN
  Administered 2019-10-11: 7.5 mg via INTRAVENOUS

## 2019-10-11 MED ORDER — POLYETHYLENE GLYCOL 3350 17 GM/SCOOP PO POWD: 1.0000 | Freq: Once | ORAL | Status: DC

## 2019-10-11 MED ORDER — ATORVASTATIN CALCIUM 40 MG PO TABS
40.0000 mg | ORAL_TABLET | Freq: Every day | ORAL | Status: DC
  Administered 2019-10-12: 18:00:00 40 mg via ORAL
  Filled 2019-10-11: qty 1

## 2019-10-11 MED ORDER — DEXAMETHASONE SODIUM PHOSPHATE 10 MG/ML IJ SOLN
INTRAMUSCULAR | Status: DC | PRN
  Administered 2019-10-11: 5 mg via INTRAVENOUS

## 2019-10-11 MED ORDER — MECLIZINE HCL 25 MG PO TABS
12.5000 mg | ORAL_TABLET | Freq: Two times a day (BID) | ORAL | Status: DC
  Administered 2019-10-11 – 2019-10-13 (×4): 12.5 mg via ORAL
  Filled 2019-10-11 (×5): qty 1

## 2019-10-11 MED ORDER — LIDOCAINE 2% (20 MG/ML) 5 ML SYRINGE
INTRAMUSCULAR | Status: DC | PRN
  Administered 2019-10-11: 1.5 mg/kg/h via INTRAVENOUS

## 2019-10-11 MED ORDER — ENOXAPARIN SODIUM 40 MG/0.4ML ~~LOC~~ SOLN
40.0000 mg | Freq: Once | SUBCUTANEOUS | Status: AC
  Administered 2019-10-11: 08:00:00 40 mg via SUBCUTANEOUS
  Filled 2019-10-11: qty 0.4

## 2019-10-11 MED ORDER — ENOXAPARIN SODIUM 40 MG/0.4ML ~~LOC~~ SOLN
40.0000 mg | SUBCUTANEOUS | Status: DC
  Administered 2019-10-12 – 2019-10-13 (×2): 40 mg via SUBCUTANEOUS
  Filled 2019-10-11 (×2): qty 0.4

## 2019-10-11 MED ORDER — METRONIDAZOLE 500 MG PO TABS
1000.0000 mg | ORAL_TABLET | ORAL | Status: DC
  Filled 2019-10-11: qty 2

## 2019-10-11 MED ORDER — METOPROLOL TARTRATE 25 MG PO TABS
25.0000 mg | ORAL_TABLET | Freq: Two times a day (BID) | ORAL | Status: DC
  Administered 2019-10-11 – 2019-10-13 (×3): 25 mg via ORAL
  Filled 2019-10-11 (×4): qty 1

## 2019-10-11 MED ORDER — ONDANSETRON HCL 4 MG/2ML IJ SOLN: 4.0000 mg | Freq: Once | INTRAMUSCULAR | Status: DC | PRN

## 2019-10-11 MED ORDER — HYDROMORPHONE HCL 1 MG/ML IJ SOLN: 0.5000 mg | INTRAMUSCULAR | Status: DC | PRN

## 2019-10-11 MED ORDER — ALUM & MAG HYDROXIDE-SIMETH 200-200-20 MG/5ML PO SUSP: 30.0000 mL | Freq: Four times a day (QID) | ORAL | Status: DC | PRN

## 2019-10-11 MED ORDER — ROCURONIUM BROMIDE 50 MG/5ML IV SOSY
PREFILLED_SYRINGE | INTRAVENOUS | Status: DC | PRN
  Administered 2019-10-11: 10 mg via INTRAVENOUS
  Administered 2019-10-11: 20 mg via INTRAVENOUS
  Administered 2019-10-11: 40 mg via INTRAVENOUS

## 2019-10-11 MED ORDER — BUPIVACAINE LIPOSOME 1.3 % IJ SUSP
INTRAMUSCULAR | Status: DC | PRN
  Administered 2019-10-11: 20 mL

## 2019-10-11 MED ORDER — ALVIMOPAN 12 MG PO CAPS
12.0000 mg | ORAL_CAPSULE | ORAL | Status: AC
  Administered 2019-10-11: 08:00:00 12 mg via ORAL
  Filled 2019-10-11: qty 1

## 2019-10-11 MED ORDER — OXYCODONE HCL 5 MG/5ML PO SOLN: 5.0000 mg | Freq: Once | ORAL | Status: DC | PRN

## 2019-10-11 MED ORDER — ALVIMOPAN 12 MG PO CAPS
12.0000 mg | ORAL_CAPSULE | Freq: Two times a day (BID) | ORAL | Status: DC
  Administered 2019-10-12: 09:00:00 12 mg via ORAL
  Filled 2019-10-11: qty 1

## 2019-10-11 MED ORDER — ALBUMIN HUMAN 5 % IV SOLN
INTRAVENOUS | Status: AC
  Filled 2019-10-11: qty 250

## 2019-10-11 MED ORDER — LIDOCAINE 2% (20 MG/ML) 5 ML SYRINGE
INTRAMUSCULAR | Status: DC | PRN
  Administered 2019-10-11: 6 mg via INTRAVENOUS

## 2019-10-11 MED ORDER — ACETAMINOPHEN 500 MG PO TABS
500.0000 mg | ORAL_TABLET | Freq: Four times a day (QID) | ORAL | Status: DC
  Administered 2019-10-11 – 2019-10-13 (×6): 500 mg via ORAL
  Filled 2019-10-11 (×7): qty 1

## 2019-10-11 MED ORDER — HYDROCODONE-ACETAMINOPHEN 5-325 MG PO TABS
1.0000 | ORAL_TABLET | Freq: Four times a day (QID) | ORAL | Status: DC | PRN
  Administered 2019-10-12: 18:00:00 1 via ORAL
  Filled 2019-10-11: qty 1

## 2019-10-11 MED ORDER — METOPROLOL TARTRATE 5 MG/5ML IV SOLN: 5.0000 mg | Freq: Four times a day (QID) | INTRAVENOUS | Status: DC | PRN

## 2019-10-11 MED ORDER — FENTANYL CITRATE (PF) 250 MCG/5ML IJ SOLN
INTRAMUSCULAR | Status: AC
  Filled 2019-10-11: qty 5

## 2019-10-11 MED ORDER — BUPIVACAINE HCL (PF) 0.25 % IJ SOLN
INTRAMUSCULAR | Status: DC | PRN
  Administered 2019-10-11: 40 mL

## 2019-10-11 MED ORDER — DIPHENHYDRAMINE HCL 12.5 MG/5ML PO ELIX
12.5000 mg | ORAL_SOLUTION | Freq: Four times a day (QID) | ORAL | Status: DC | PRN
  Filled 2019-10-11: qty 5

## 2019-10-11 MED ORDER — GABAPENTIN 100 MG PO CAPS
200.0000 mg | ORAL_CAPSULE | Freq: Three times a day (TID) | ORAL | Status: DC
  Administered 2019-10-11 – 2019-10-13 (×6): 200 mg via ORAL
  Filled 2019-10-11 (×6): qty 2

## 2019-10-11 MED ORDER — FENTANYL CITRATE (PF) 100 MCG/2ML IJ SOLN: 25.0000 ug | INTRAMUSCULAR | Status: DC | PRN

## 2019-10-11 MED ORDER — DIPHENHYDRAMINE HCL 50 MG/ML IJ SOLN
12.5000 mg | Freq: Four times a day (QID) | INTRAMUSCULAR | Status: DC | PRN
  Administered 2019-10-11: 14:00:00 12.5 mg via INTRAVENOUS
  Filled 2019-10-11: qty 1

## 2019-10-11 MED ORDER — ONDANSETRON HCL 4 MG PO TABS: 4.0000 mg | ORAL_TABLET | Freq: Four times a day (QID) | ORAL | Status: DC | PRN

## 2019-10-11 MED ORDER — INFLUENZA VAC A&B SA ADJ QUAD 0.5 ML IM PRSY
0.5000 mL | PREFILLED_SYRINGE | INTRAMUSCULAR | Status: AC
  Administered 2019-10-13: 0.5 mL via INTRAMUSCULAR
  Filled 2019-10-11: qty 0.5

## 2019-10-11 MED ORDER — LIDOCAINE 2% (20 MG/ML) 5 ML SYRINGE
INTRAMUSCULAR | Status: AC
  Filled 2019-10-11: qty 5

## 2019-10-11 MED ORDER — ACETAMINOPHEN 500 MG PO TABS
1000.0000 mg | ORAL_TABLET | ORAL | Status: AC
  Administered 2019-10-11: 08:00:00 1000 mg via ORAL
  Filled 2019-10-11: qty 2

## 2019-10-11 MED ORDER — ONDANSETRON HCL 4 MG/2ML IJ SOLN
INTRAMUSCULAR | Status: DC | PRN
  Administered 2019-10-11: 4 mg via INTRAVENOUS

## 2019-10-11 MED ORDER — GABAPENTIN 100 MG PO CAPS
200.0000 mg | ORAL_CAPSULE | ORAL | Status: AC
  Administered 2019-10-11: 08:00:00 200 mg via ORAL
  Filled 2019-10-11: qty 2

## 2019-10-11 MED ORDER — ENSURE SURGERY PO LIQD
237.0000 mL | Freq: Two times a day (BID) | ORAL | Status: DC
  Administered 2019-10-11: 16:00:00 237 mL via ORAL
  Filled 2019-10-11 (×5): qty 237

## 2019-10-11 MED ORDER — BISACODYL 5 MG PO TBEC
20.0000 mg | DELAYED_RELEASE_TABLET | Freq: Once | ORAL | Status: DC
  Filled 2019-10-11: qty 4

## 2019-10-11 MED ORDER — NITROGLYCERIN 0.4 MG SL SUBL: 0.4000 mg | SUBLINGUAL_TABLET | SUBLINGUAL | Status: DC | PRN

## 2019-10-11 MED ORDER — FENTANYL CITRATE (PF) 250 MCG/5ML IJ SOLN
INTRAMUSCULAR | Status: DC | PRN
  Administered 2019-10-11 (×6): 50 ug via INTRAVENOUS

## 2019-10-11 MED ORDER — SODIUM CHLORIDE 0.9 % IV SOLN: Freq: Three times a day (TID) | INTRAVENOUS | Status: DC | PRN

## 2019-10-11 MED ORDER — BUPIVACAINE HCL (PF) 0.25 % IJ SOLN
INTRAMUSCULAR | Status: AC
  Filled 2019-10-11: qty 60

## 2019-10-11 MED ORDER — SACCHAROMYCES BOULARDII 250 MG PO CAPS
250.0000 mg | ORAL_CAPSULE | Freq: Two times a day (BID) | ORAL | Status: DC
  Administered 2019-10-11 – 2019-10-13 (×4): 250 mg via ORAL
  Filled 2019-10-11 (×4): qty 1

## 2019-10-11 MED ORDER — LACTATED RINGERS IV SOLN
INTRAVENOUS | Status: DC
  Administered 2019-10-11 (×2): via INTRAVENOUS

## 2019-10-11 MED ORDER — ONDANSETRON HCL 4 MG/2ML IJ SOLN
INTRAMUSCULAR | Status: AC
  Filled 2019-10-11: qty 2

## 2019-10-11 MED ORDER — SODIUM CHLORIDE 0.9 % IV SOLN
2.0000 g | Freq: Two times a day (BID) | INTRAVENOUS | Status: AC
  Administered 2019-10-11: 20:00:00 2 g via INTRAVENOUS
  Filled 2019-10-11: qty 2

## 2019-10-11 MED ORDER — LACTATED RINGERS IR SOLN
Status: DC | PRN
  Administered 2019-10-11: 1000 mL

## 2019-10-11 MED ORDER — SUCCINYLCHOLINE CHLORIDE 200 MG/10ML IV SOSY
PREFILLED_SYRINGE | INTRAVENOUS | Status: AC
  Filled 2019-10-11: qty 10

## 2019-10-11 MED ORDER — KETAMINE HCL 10 MG/ML IJ SOLN
INTRAMUSCULAR | Status: DC | PRN
  Administered 2019-10-11: 30 mg via INTRAVENOUS

## 2019-10-11 MED ORDER — PROCHLORPERAZINE EDISYLATE 10 MG/2ML IJ SOLN: 5.0000 mg | Freq: Four times a day (QID) | INTRAMUSCULAR | Status: DC | PRN

## 2019-10-11 MED ORDER — OXYCODONE HCL 5 MG PO TABS: 5.0000 mg | ORAL_TABLET | Freq: Once | ORAL | Status: DC | PRN

## 2019-10-11 MED ORDER — LEVOTHYROXINE SODIUM 50 MCG PO TABS
50.0000 ug | ORAL_TABLET | Freq: Every day | ORAL | Status: DC
  Administered 2019-10-12 – 2019-10-13 (×2): 50 ug via ORAL
  Filled 2019-10-11 (×2): qty 1

## 2019-10-11 MED ORDER — 0.9 % SODIUM CHLORIDE (POUR BTL) OPTIME
TOPICAL | Status: DC | PRN
  Administered 2019-10-11: 2000 mL

## 2019-10-11 MED ORDER — PHENYLEPHRINE 40 MCG/ML (10ML) SYRINGE FOR IV PUSH (FOR BLOOD PRESSURE SUPPORT)
PREFILLED_SYRINGE | INTRAVENOUS | Status: AC
  Filled 2019-10-11: qty 10

## 2019-10-11 MED ORDER — SODIUM CHLORIDE 0.9 % IV SOLN
2.0000 g | INTRAVENOUS | Status: AC
  Administered 2019-10-11: 2 g via INTRAVENOUS
  Filled 2019-10-11: qty 2

## 2019-10-11 MED ORDER — SUGAMMADEX SODIUM 200 MG/2ML IV SOLN
INTRAVENOUS | Status: DC | PRN
  Administered 2019-10-11: 150 mg via INTRAVENOUS

## 2019-10-11 MED ORDER — PROCHLORPERAZINE MALEATE 10 MG PO TABS: 10.0000 mg | ORAL_TABLET | Freq: Four times a day (QID) | ORAL | Status: DC | PRN

## 2019-10-11 MED ORDER — PROPOFOL 10 MG/ML IV BOLUS
INTRAVENOUS | Status: DC | PRN
  Administered 2019-10-11: 80 mg via INTRAVENOUS

## 2019-10-11 MED ORDER — ALBUMIN HUMAN 5 % IV SOLN
INTRAVENOUS | Status: DC | PRN
  Administered 2019-10-11: 10:00:00 via INTRAVENOUS

## 2019-10-11 MED ORDER — LIDOCAINE HCL 2 % IJ SOLN
INTRAMUSCULAR | Status: AC
  Filled 2019-10-11: qty 20

## 2019-10-11 MED ORDER — ASPIRIN EC 81 MG PO TBEC
81.0000 mg | DELAYED_RELEASE_TABLET | Freq: Every evening | ORAL | Status: DC
  Administered 2019-10-12: 81 mg via ORAL
  Filled 2019-10-11: qty 1

## 2019-10-11 MED ORDER — PHENYLEPHRINE 40 MCG/ML (10ML) SYRINGE FOR IV PUSH (FOR BLOOD PRESSURE SUPPORT)
PREFILLED_SYRINGE | INTRAVENOUS | Status: DC | PRN
  Administered 2019-10-11: 40 ug via INTRAVENOUS

## 2019-10-11 MED ORDER — ROCURONIUM BROMIDE 10 MG/ML (PF) SYRINGE
PREFILLED_SYRINGE | INTRAVENOUS | Status: AC
  Filled 2019-10-11: qty 10

## 2019-10-11 MED ORDER — EPHEDRINE SULFATE-NACL 50-0.9 MG/10ML-% IV SOSY
PREFILLED_SYRINGE | INTRAVENOUS | Status: DC | PRN
  Administered 2019-10-11: 10 mg via INTRAVENOUS
  Administered 2019-10-11: 5 mg via INTRAVENOUS

## 2019-10-11 MED ORDER — DEXAMETHASONE SODIUM PHOSPHATE 10 MG/ML IJ SOLN
INTRAMUSCULAR | Status: AC
  Filled 2019-10-11: qty 1

## 2019-10-11 MED ORDER — PROPOFOL 10 MG/ML IV BOLUS
INTRAVENOUS | Status: AC
  Filled 2019-10-11: qty 20

## 2019-10-11 MED ORDER — LACTATED RINGERS IV SOLN
INTRAVENOUS | Status: DC
  Administered 2019-10-12: 02:00:00 via INTRAVENOUS

## 2019-10-11 MED ORDER — PHENYLEPHRINE HCL-NACL 10-0.9 MG/250ML-% IV SOLN
INTRAVENOUS | Status: DC | PRN
  Administered 2019-10-11: 50 ug/min via INTRAVENOUS

## 2019-10-11 MED ORDER — FENTANYL CITRATE (PF) 100 MCG/2ML IJ SOLN
INTRAMUSCULAR | Status: AC
  Filled 2019-10-11: qty 2

## 2019-10-11 MED ORDER — HYDROCODONE-ACETAMINOPHEN 10-325 MG PO TABS: 0.5000 | ORAL_TABLET | ORAL | Status: DC | PRN

## 2019-10-11 MED ORDER — ONDANSETRON HCL 4 MG/2ML IJ SOLN
4.0000 mg | Freq: Four times a day (QID) | INTRAMUSCULAR | Status: DC | PRN
  Administered 2019-10-11: 14:00:00 4 mg via INTRAVENOUS
  Filled 2019-10-11: qty 2

## 2019-10-11 MED ORDER — NEOMYCIN SULFATE 500 MG PO TABS
1000.0000 mg | ORAL_TABLET | ORAL | Status: DC
  Filled 2019-10-11: qty 2

## 2019-10-11 MED ORDER — KETAMINE HCL 10 MG/ML IJ SOLN
INTRAMUSCULAR | Status: AC
  Filled 2019-10-11: qty 1

## 2019-10-11 MED ORDER — SODIUM CHLORIDE 0.9 % IV SOLN
INTRAVENOUS | Status: DC | PRN
  Administered 2019-10-11: 1000 mL

## 2019-10-11 SURGICAL SUPPLY — 108 items
APL PRP STRL LF DISP 70% ISPRP (MISCELLANEOUS) ×1
APPLIER CLIP 5 13 M/L LIGAMAX5 (MISCELLANEOUS)
APPLIER CLIP ROT 10 11.4 M/L (STAPLE)
APR CLP MED LRG 11.4X10 (STAPLE)
APR CLP MED LRG 5 ANG JAW (MISCELLANEOUS)
BLADE EXTENDED COATED 6.5IN (ELECTRODE) ×2 IMPLANT
CANNULA REDUC XI 12-8 STAPL (CANNULA) ×1
CANNULA REDUCER 12-8 DVNC XI (CANNULA) ×1 IMPLANT
CELLS DAT CNTRL 66122 CELL SVR (MISCELLANEOUS) IMPLANT
CHLORAPREP W/TINT 26 (MISCELLANEOUS) ×2 IMPLANT
CLIP APPLIE 5 13 M/L LIGAMAX5 (MISCELLANEOUS) IMPLANT
CLIP APPLIE ROT 10 11.4 M/L (STAPLE) IMPLANT
CLIP VESOLOCK LG 6/CT PURPLE (CLIP) IMPLANT
CLIP VESOLOCK MED LG 6/CT (CLIP) IMPLANT
COVER SURGICAL LIGHT HANDLE (MISCELLANEOUS) ×4 IMPLANT
COVER TIP SHEARS 8 DVNC (MISCELLANEOUS) ×1 IMPLANT
COVER TIP SHEARS 8MM DA VINCI (MISCELLANEOUS) ×1
COVER WAND RF STERILE (DRAPES) IMPLANT
DECANTER SPIKE VIAL GLASS SM (MISCELLANEOUS) ×2 IMPLANT
DEVICE TROCAR PUNCTURE CLOSURE (ENDOMECHANICALS) IMPLANT
DRAIN CHANNEL 19F RND (DRAIN) ×2 IMPLANT
DRAPE ARM DVNC X/XI (DISPOSABLE) ×4 IMPLANT
DRAPE COLUMN DVNC XI (DISPOSABLE) ×1 IMPLANT
DRAPE DA VINCI XI ARM (DISPOSABLE) ×4
DRAPE DA VINCI XI COLUMN (DISPOSABLE) ×1
DRAPE SURG IRRIG POUCH 19X23 (DRAPES) ×2 IMPLANT
DRSG OPSITE POSTOP 4X10 (GAUZE/BANDAGES/DRESSINGS) IMPLANT
DRSG OPSITE POSTOP 4X6 (GAUZE/BANDAGES/DRESSINGS) ×1 IMPLANT
DRSG OPSITE POSTOP 4X8 (GAUZE/BANDAGES/DRESSINGS) IMPLANT
DRSG TEGADERM 2-3/8X2-3/4 SM (GAUZE/BANDAGES/DRESSINGS) ×6 IMPLANT
DRSG TEGADERM 4X4.75 (GAUZE/BANDAGES/DRESSINGS) ×2 IMPLANT
ELECT PENCIL ROCKER SW 15FT (MISCELLANEOUS) ×2 IMPLANT
ELECT REM PT RETURN 15FT ADLT (MISCELLANEOUS) ×2 IMPLANT
ENDOLOOP SUT PDS II  0 18 (SUTURE)
ENDOLOOP SUT PDS II 0 18 (SUTURE) IMPLANT
EVACUATOR SILICONE 100CC (DRAIN) ×2 IMPLANT
GAUZE SPONGE 2X2 8PLY STRL LF (GAUZE/BANDAGES/DRESSINGS) ×1 IMPLANT
GAUZE SPONGE 4X4 12PLY STRL (GAUZE/BANDAGES/DRESSINGS) IMPLANT
GLOVE ECLIPSE 8.0 STRL XLNG CF (GLOVE) ×10 IMPLANT
GLOVE INDICATOR 8.0 STRL GRN (GLOVE) ×10 IMPLANT
GOWN STRL REUS W/TWL XL LVL3 (GOWN DISPOSABLE) ×10 IMPLANT
GRASPER SUT TROCAR 14GX15 (MISCELLANEOUS) ×2 IMPLANT
HOLDER FOLEY CATH W/STRAP (MISCELLANEOUS) ×2 IMPLANT
IRRIG SUCT STRYKERFLOW 2 WTIP (MISCELLANEOUS) ×2
IRRIGATION SUCT STRKRFLW 2 WTP (MISCELLANEOUS) IMPLANT
KIT PROCEDURE DA VINCI SI (MISCELLANEOUS) ×1
KIT PROCEDURE DVNC SI (MISCELLANEOUS) IMPLANT
KIT TURNOVER KIT A (KITS) IMPLANT
NDL INSUFFLATION 14GA 120MM (NEEDLE) ×1 IMPLANT
NEEDLE INSUFFLATION 14GA 120MM (NEEDLE) ×2 IMPLANT
PACK CARDIOVASCULAR III (CUSTOM PROCEDURE TRAY) ×2 IMPLANT
PACK COLON (CUSTOM PROCEDURE TRAY) ×2 IMPLANT
PAD POSITIONING PINK XL (MISCELLANEOUS) ×2 IMPLANT
PORT LAP GEL ALEXIS MED 5-9CM (MISCELLANEOUS) ×2 IMPLANT
PROTECTOR NERVE ULNAR (MISCELLANEOUS) ×4 IMPLANT
RELOAD STAPLE 45 BLU REG DVNC (STAPLE) IMPLANT
RELOAD STAPLE 45 GRN THCK DVNC (STAPLE) IMPLANT
RELOAD STAPLE 60 3.5 BLU DVNC (STAPLE) IMPLANT
RELOAD STAPLER 3.5X60 BLU DVNC (STAPLE) ×3 IMPLANT
RETRACTOR WND ALEXIS 18 MED (MISCELLANEOUS) IMPLANT
RTRCTR WOUND ALEXIS 18CM MED (MISCELLANEOUS)
SCISSORS LAP 5X35 DISP (ENDOMECHANICALS) ×2 IMPLANT
SEAL CANN UNIV 5-8 DVNC XI (MISCELLANEOUS) ×4 IMPLANT
SEAL XI 5MM-8MM UNIVERSAL (MISCELLANEOUS) ×4
SEALER VESSEL DA VINCI XI (MISCELLANEOUS) ×1
SEALER VESSEL EXT DVNC XI (MISCELLANEOUS) ×1 IMPLANT
SLEEVE ADV FIXATION 5X100MM (TROCAR) IMPLANT
SOLUTION ELECTROLUBE (MISCELLANEOUS) ×3 IMPLANT
SPONGE GAUZE 2X2 STER 10/PKG (GAUZE/BANDAGES/DRESSINGS) ×1
STAPLER 45 BLU RELOAD XI (STAPLE) IMPLANT
STAPLER 45 BLUE RELOAD XI (STAPLE)
STAPLER 45 GREEN RELOAD XI (STAPLE)
STAPLER 45 GRN RELOAD XI (STAPLE) IMPLANT
STAPLER 60 DA VINCI SURE FORM (STAPLE) ×1
STAPLER 60 SUREFORM DVNC (STAPLE) IMPLANT
STAPLER CANNULA SEAL DVNC XI (STAPLE) ×1 IMPLANT
STAPLER CANNULA SEAL XI (STAPLE) ×1
STAPLER RELOAD 3.5X60 BLU DVNC (STAPLE) ×3
STAPLER RELOAD 3.5X60 BLUE (STAPLE) ×3
STAPLER SHEATH (SHEATH) ×1
STAPLER SHEATH ENDOWRIST DVNC (SHEATH) ×1 IMPLANT
SURGILUBE 2OZ TUBE FLIPTOP (MISCELLANEOUS) ×2 IMPLANT
SUT MNCRL AB 4-0 PS2 18 (SUTURE) ×2 IMPLANT
SUT PDS AB 1 CT1 27 (SUTURE) ×4 IMPLANT
SUT PDS AB 1 TP1 96 (SUTURE) IMPLANT
SUT PROLENE 0 CT 2 (SUTURE) IMPLANT
SUT PROLENE 2 0 KS (SUTURE) IMPLANT
SUT PROLENE 2 0 SH DA (SUTURE) IMPLANT
SUT SILK 2 0 (SUTURE)
SUT SILK 2 0 SH CR/8 (SUTURE) IMPLANT
SUT SILK 2-0 18XBRD TIE 12 (SUTURE) IMPLANT
SUT SILK 3 0 (SUTURE) ×2
SUT SILK 3 0 SH CR/8 (SUTURE) ×2 IMPLANT
SUT SILK 3-0 18XBRD TIE 12 (SUTURE) ×1 IMPLANT
SUT V-LOC BARB 180 2/0GR6 GS22 (SUTURE) ×4
SUT VIC AB 3-0 SH 18 (SUTURE) IMPLANT
SUT VIC AB 3-0 SH 27 (SUTURE)
SUT VIC AB 3-0 SH 27XBRD (SUTURE) IMPLANT
SUT VICRYL 0 UR6 27IN ABS (SUTURE) ×2 IMPLANT
SUTURE V-LC BRB 180 2/0GR6GS22 (SUTURE) IMPLANT
SYR 10ML ECCENTRIC (SYRINGE) ×2 IMPLANT
SYS LAPSCP GELPORT 120MM (MISCELLANEOUS)
SYSTEM LAPSCP GELPORT 120MM (MISCELLANEOUS) IMPLANT
TAPE UMBILICAL COTTON 1/8X30 (MISCELLANEOUS) ×1 IMPLANT
TOWEL OR NON WOVEN STRL DISP B (DISPOSABLE) ×2 IMPLANT
TROCAR ADV FIXATION 5X100MM (TROCAR) ×2 IMPLANT
TUBING CONNECTING 10 (TUBING) ×4 IMPLANT
TUBING INSUFFLATION 10FT LAP (TUBING) ×2 IMPLANT

## 2019-10-11 NOTE — Anesthesia Postprocedure Evaluation (Signed)
Anesthesia Post Note  Patient: Jessica Taylor  Procedure(s) Performed: XI ROBOTIC ASSISTED RIGHT PROXIMAL COLECTOMY WITH BILATERAL TAP BLOCK (N/A Abdomen)     Patient location during evaluation: PACU Anesthesia Type: General Level of consciousness: awake and alert Pain management: pain level controlled Vital Signs Assessment: post-procedure vital signs reviewed and stable Respiratory status: spontaneous breathing, nonlabored ventilation and respiratory function stable Cardiovascular status: blood pressure returned to baseline and stable Postop Assessment: no apparent nausea or vomiting Anesthetic complications: no    Last Vitals:  Vitals:   10/11/19 1428 10/11/19 1523  BP: (!) 171/76 (!) 172/78  Pulse: 67 69  Resp:  17  Temp:  (!) 36.3 C  SpO2: 99% 100%    Last Pain:  Vitals:   10/11/19 1523  TempSrc: Oral  PainSc:                  Lidia Collum

## 2019-10-11 NOTE — H&P (Signed)
Jessica Taylor DOB: 12-27-1940 Married / Language: [name] / Race: White Female  Patient Care Team: Antony Contras, MD as PCP - General (Family Medicine) Jerline Pain, MD as PCP - Cardiology (Cardiology) Michael Boston, MD as Consulting Physician (General Surgery) Jerline Pain, MD as Consulting Physician (Cardiology) Mansouraty, Telford Nab., MD as Consulting Physician (Gastroenterology)  ` Patient sent for surgical consultation at the request of Dr Luciana Axe  Chief Complaint: New ascending colon cancer. ` ` The patient is a pleasant elderly woman. She comes here with her daughter-in-law since she confesses since hard to remember everything. He had worsening anemia and bowel changes. And of getting transfused. She thinks she might of notice some blood occasionally. No severe melena or massive bright red blood in bowel movements. Underwent endoscopy. Found to have bulky near obstructing tumor in proximal ascending colon. Biopsy consistent with adenocarcinoma. Given IV iron transfusions. Surgical consultation requested. Patient does not have great recollection of this. Her husband is rather hard of hearing and apparently did not recall much as there. That is the reason why the daughter-in-law is here to help sort things out. Patient notes usually moves her bowels about every other day but lately has been only twice a week. Some discomfort with eating but no nausea or vomiting. Patient never had a colonoscopy before him. No prior daily gastrointestinal issues that she can recall. She had tubal ligation in 1963, no other surgeries.  She does have a history coronary disease that required a 3 vessel bypass by Dr. Cyndia Bent in 2017. She has been on Plavix before and since. Follow-up by Dr. Marlou Porch. She can only walk a block or 2 before her knees give out. No real exertional chest pain or shortness of breath. She has not smoked in many years. She is not diabetic. Denies  much in way of heartburn or reflux issues. All her children have had colonoscopies. No personal nor family history of nflammatory bowel disease, irritable bowel syndrome, allergy such as Celiac Sprue, dietary/dairy problems, colitis, ulcers nor gastritis. No recent sick contacts/gastroenteritis. No travel outside the country. No changes in diet. No dysphagia to solids or liquids. No significant heartburn or reflux. No hematemesis, coffee ground emesis. No evidence of prior gastric/peptic ulceration.  Ready for surgery  (Review of systems as stated in this history (HPI) or in the review of systems. Otherwise all other 12 point ROS are negative) ` ` `   Allergies (Sabrina Canty, CMA; 08/14/2019 10:45 AM) Shrimp Allergies Reconciled  Medication History (Sabrina Canty, CMA; 08/14/2019 10:47 AM) Metoprolol Tartrate (25MG  Tablet, Oral) Active. Levothyroxine Sodium (50MCG Tablet, Oral) Active. Atorvastatin Calcium (40MG  Tablet, Oral) Active. Clopidogrel Bisulfate (75MG  Tablet, Oral) Active. HYDROcodone-Acetaminophen (5-325MG  Tablet, Oral) Active. Ondansetron (4MG  Tablet Disint, Oral) Active. Plavix (75MG  Tablet, Oral) Active. Lipitor (20MG  Tablet, Oral) Active. Medications Reconciled    Vitals (Sabrina Canty CMA; 08/14/2019 10:47 AM) 08/14/2019 10:47 AM Weight: 149.8 lb Height: 63in Body Surface Area: 1.71 m Body Mass Index: 26.54 kg/m  Temp.: 97.53F(Temporal)  Pulse: 92 (Regular)  BP: 152/84 (Sitting, Left Arm, Standard)    10/11/2019 BP (!) 148/74   Pulse 65   Temp 97.6 F (36.4 C) (Oral)   Resp 16   SpO2 100%      Physical Exam Adin Hector MD; 08/14/2019 11:39 AM)  General Mental Status-Alert. General Appearance-Not in acute distress, Not Sickly. Orientation-Oriented X3. Hydration-Well hydrated. Voice-Normal.  Integumentary Global Assessment Upon inspection and palpation of skin surfaces of the -  Axillae: non-tender,  no inflammation or ulceration, no drainage. and Distribution of scalp and body hair is normal. General Characteristics Temperature - normal warmth is noted.  Head and Neck Head-normocephalic, atraumatic with no lesions or palpable masses. Face Global Assessment - atraumatic, no absence of expression. Neck Global Assessment - no abnormal movements, no bruit auscultated on the right, no bruit auscultated on the left, no decreased range of motion, non-tender. Trachea-midline. Thyroid Gland Characteristics - non-tender.  Eye Eyeball - Left-Extraocular movements intact, No Nystagmus - Left. Eyeball - Right-Extraocular movements intact, No Nystagmus - Right. Cornea - Left-No Hazy - Left. Cornea - Right-No Hazy - Right. Sclera/Conjunctiva - Left-No scleral icterus, No Discharge - Left. Sclera/Conjunctiva - Right-No scleral icterus, No Discharge - Right. Pupil - Left-Direct reaction to light normal. Pupil - Right-Direct reaction to light normal.  ENMT Ears Pinna - Left - no drainage observed, no generalized tenderness observed. Pinna - Right - no drainage observed, no generalized tenderness observed. Nose and Sinuses External Inspection of the Nose - no destructive lesion observed. Inspection of the nares - Left - quiet respiration. Inspection of the nares - Right - quiet respiration. Mouth and Throat Lips - Upper Lip - no fissures observed, no pallor noted. Lower Lip - no fissures observed, no pallor noted. Nasopharynx - no discharge present. Oral Cavity/Oropharynx - Tongue - no dryness observed. Oral Mucosa - no cyanosis observed. Hypopharynx - no evidence of airway distress observed.  Chest and Lung Exam Inspection Movements - Normal and Symmetrical. Accessory muscles - No use of accessory muscles in breathing. Palpation Palpation of the chest reveals - Non-tender. Auscultation Breath sounds - Normal and  Clear.  Cardiovascular Auscultation Rhythm - Regular. Murmurs & Other Heart Sounds - Auscultation of the heart reveals - No Murmurs and No Systolic Clicks.  Abdomen Inspection Inspection of the abdomen reveals - No Visible peristalsis and No Abnormal pulsations. Umbilicus - No Bleeding, No Urine drainage. Palpation/Percussion Palpation and Percussion of the abdomen reveal - Soft, Non Tender, No Rebound tenderness, No Rigidity (guarding) and No Cutaneous hyperesthesia. Note: Abdomen obese but soft. Mild symmetrical diastases and thinning but no true hernia. Mild distention and discomfort. No focal region. Soft. Not severely distended. No umbilical or other anterior abdominal wall hernias  Female Genitourinary Sexual Maturity Tanner 5 - Adult hair pattern. Note: No vaginal bleeding nor discharge  Peripheral Vascular Upper Extremity Inspection - Left - No Cyanotic nailbeds - Left, Not Ischemic. Inspection - Right - No Cyanotic nailbeds - Right, Not Ischemic.  Neurologic Neurologic evaluation reveals -normal attention span and ability to concentrate, able to name objects and repeat phrases. Appropriate fund of knowledge , normal sensation and normal coordination. Mental Status Affect - not angry, not paranoid. Cranial Nerves-Normal Bilaterally. Gait-Normal.  Neuropsychiatric Mental status exam performed with findings of-able to articulate well with normal speech/language, rate, volume and coherence, thought content normal with ability to perform basic computations and apply abstract reasoning and no evidence of hallucinations, delusions, obsessions or homicidal/suicidal ideation. Note: Long-term memory fair. No frank dementia or psychosis. No delirium.  Musculoskeletal Global Assessment Spine, Ribs and Pelvis - no instability, subluxation or laxity. Right Upper Extremity - no instability, subluxation or laxity.  Lymphatic Head & Neck  General Head & Neck  Lymphatics: Bilateral - Description - No Localized lymphadenopathy. Axillary  General Axillary Region: Bilateral - Description - No Localized lymphadenopathy. Femoral & Inguinal  Generalized Femoral & Inguinal Lymphatics: Left - Description - No Localized lymphadenopathy. Right - Description - No Localized lymphadenopathy.  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 At least partially obstructing according to hernia surgery urologist Some suspicious lymphadenopathy and tumor extension but no frank metastatic disease.  Standard of care would be segmental colectomy. Reasonable robotic minimally invasive approach for intracorporeal anastomosis.  Cardiac clearance done   The anatomy & physiology of the digestive tract was discussed. The pathophysiology of the colon was discussed. Natural history risks without surgery was discussed. I feel the risks of no intervention will lead to serious problems that outweigh the operative risks; therefore, I recommended a partial colectomy to remove the pathology. Minimally invasive (Robotic/Laparoscopic) & open techniques were discussed.  Risks such as bleeding, infection, abscess, leak, reoperation, possible ostomy, hernia, heart attack, death, and other risks were discussed. I noted a good likelihood this will help address the problem. Goals of post-operative recovery were discussed as well. Need for adequate nutrition, daily bowel regimen and healthy physical activity, to optimize recovery was noted as well. We will work to minimize complications. Educational materials were available as well. Questions were answered. The patient expresses understanding & wishes to proceed with surgery.  .  CORONARY ARTERY DISEASE (I25.10)  Current Plans I recommended obtaining preoperative  cardiac clearance.ANTICOAGULATED (Z79.01)  Current Plans Pt Education - CCS Hold anticoagulation preoperatively  Adin Hector, MD, FACS, MASCRS Gastrointestinal and Minimally Invasive Surgery    [address] [place], [place] [address] [phone] Main / Paging [phone] Fax

## 2019-10-11 NOTE — Discharge Instructions (Signed)
SURGERY: POST OP INSTRUCTIONS °(Surgery for small bowel obstruction, colon resection, etc) ° ° °###################################################################### ° °EAT °Gradually transition to a high fiber diet with a fiber supplement over the next few days after discharge ° °WALK °Walk an hour a day.  Control your pain to do that.   ° °CONTROL PAIN °Control pain so that you can walk, sleep, tolerate sneezing/coughing, go up/down stairs. ° °HAVE A BOWEL MOVEMENT DAILY °Keep your bowels regular to avoid problems.  OK to try a laxative to override constipation.  OK to use an antidairrheal to slow down diarrhea.  Call if not better after 2 tries ° °CALL IF YOU HAVE PROBLEMS/CONCERNS °Call if you are still struggling despite following these instructions. °Call if you have concerns not answered by these instructions ° °###################################################################### ° ° °DIET °Follow a light diet the first few days at home.  Start with a bland diet such as soups, liquids, starchy foods, low fat foods, etc.  If you feel full, bloated, or constipated, stay on a ful liquid or pureed/blenderized diet for a few days until you feel better and no longer constipated. °Be sure to drink plenty of fluids every day to avoid getting dehydrated (feeling dizzy, not urinating, etc.). °Gradually add a fiber supplement to your diet over the next week.  Gradually get back to a regular solid diet.  Avoid fast food or heavy meals the first week as you are more likely to get nauseated. °It is expected for your digestive tract to need a few months to get back to normal.  It is common for your bowel movements and stools to be irregular.  You will have occasional bloating and cramping that should eventually fade away.  Until you are eating solid food normally, off all pain medications, and back to regular activities; your bowels will not be normal. °Focus on eating a low-fat, high fiber diet the rest of your life  (See Getting to Good Bowel Health, below). ° °CARE of your INCISION or WOUND °It is good for closed incision and even open wounds to be washed every day.  Shower every day.  Short baths are fine.  Wash the incisions and wounds clean with soap & water.    °If you have a closed incision(s), wash the incision with soap & water every day.  You may leave closed incisions open to air if it is dry.   You may cover the incision with clean gauze & replace it after your daily shower for comfort. °If you have skin tapes (Steristrips) or skin glue (Dermabond) on your incision, leave them in place.  They will fall off on their own like a scab.  You may trim any edges that curl up with clean scissors.  If you have staples, set up an appointment for them to be removed in the office in 10 days after surgery.  °If you have a drain, wash around the skin exit site with soap & water and place a new dressing of gauze or band aid around the skin every day.  Keep the drain site clean & dry.    °If you have an open wound with packing, see wound care instructions.  In general, it is encouraged that you remove your dressing and packing, shower with soap & water, and replace your dressing once a day.  Pack the wound with clean gauze moistened with normal (0.9%) saline to keep the wound moist & uninfected.  Pressure on the dressing for 30 minutes will stop most wound   bleeding.  Eventually your body will heal & pull the open wound closed over the next few months.  °Raw open wounds will occasionally bleed or secrete yellow drainage until it heals closed.  Drain sites will drain a little until the drain is removed.  Even closed incisions can have mild bleeding or drainage the first few days until the skin edges scab over & seal.   °If you have an open wound with a wound vac, see wound vac care instructions. ° ° ° ° °ACTIVITIES as tolerated °Start light daily activities --- self-care, walking, climbing stairs-- beginning the day after surgery.   Gradually increase activities as tolerated.  Control your pain to be active.  Stop when you are tired.  Ideally, walk several times a day, eventually an hour a day.   °Most people are back to most day-to-day activities in a few weeks.  It takes 4-8 weeks to get back to unrestricted, intense activity. °If you can walk 30 minutes without difficulty, it is safe to try more intense activity such as jogging, treadmill, bicycling, low-impact aerobics, swimming, etc. °Save the most intensive and strenuous activity for last (Usually 4-8 weeks after surgery) such as sit-ups, heavy lifting, contact sports, etc.  Refrain from any intense heavy lifting or straining until you are off narcotics for pain control.  You will have off days, but things should improve week-by-week. °DO NOT PUSH THROUGH PAIN.  Let pain be your guide: If it hurts to do something, don't do it.  Pain is your body warning you to avoid that activity for another week until the pain goes down. °You may drive when you are no longer taking narcotic prescription pain medication, you can comfortably wear a seatbelt, and you can safely make sudden turns/stops to protect yourself without hesitating due to pain. °You may have sexual intercourse when it is comfortable. If it hurts to do something, stop. ° °MEDICATIONS °Take your usually prescribed home medications unless otherwise directed.   °Blood thinners:  °Usually you can restart any strong blood thinners after the second postoperative day.  It is OK to take aspirin right away.    ° If you are on strong blood thinners (warfarin/Coumadin, Plavix, Xerelto, Eliquis, Pradaxa, etc), discuss with your surgeon, medicine PCP, and/or cardiologist for instructions on when to restart the blood thinner & if blood monitoring is needed (PT/INR blood check, etc).   ° ° °PAIN CONTROL °Pain after surgery or related to activity is often due to strain/injury to muscle, tendon, nerves and/or incisions.  This pain is usually  short-term and will improve in a few months.  °To help speed the process of healing and to get back to regular activity more quickly, DO THE FOLLOWING THINGS TOGETHER: °1. Increase activity gradually.  DO NOT PUSH THROUGH PAIN °2. Use Ice and/or Heat °3. Try Gentle Massage and/or Stretching °4. Take over the counter pain medication °5. Take Narcotic prescription pain medication for more severe pain ° °Good pain control = faster recovery.  It is better to take more medicine to be more active than to stay in bed all day to avoid medications. °1.  Increase activity gradually °Avoid heavy lifting at first, then increase to lifting as tolerated over the next 6 weeks. °Do not “push through” the pain.  Listen to your body and avoid positions and maneuvers than reproduce the pain.  Wait a few days before trying something more intense °Walking an hour a day is encouraged to help your body recover faster   and more safely.  Start slowly and stop when getting sore.  If you can walk 30 minutes without stopping or pain, you can try more intense activity (running, jogging, aerobics, cycling, swimming, treadmill, sex, sports, weightlifting, etc.) °Remember: If it hurts to do it, then don’t do it! °2. Use Ice and/or Heat °You will have swelling and bruising around the incisions.  This will take several weeks to resolve. °Ice packs or heating pads (6-8 times a day, 30-60 minutes at a time) will help sooth soreness & bruising. °Some people prefer to use ice alone, heat alone, or alternate between ice & heat.  Experiment and see what works best for you.  Consider trying ice for the first few days to help decrease swelling and bruising; then, switch to heat to help relax sore spots and speed recovery. °Shower every day.  Short baths are fine.  It feels good!  Keep the incisions and wounds clean with soap & water.   °3. Try Gentle Massage and/or Stretching °Massage at the area of pain many times a day °Stop if you feel pain - do not  overdo it °4. Take over the counter pain medication °This helps the muscle and nerve tissues become less irritable and calm down faster °Choose ONE of the following over-the-counter anti-inflammatory medications: °Acetaminophen 500mg tabs (Tylenol) 1-2 pills with every meal and just before bedtime (avoid if you have liver problems or if you have acetaminophen in you narcotic prescription) °Naproxen 220mg tabs (ex. Aleve, Naprosyn) 1-2 pills twice a day (avoid if you have kidney, stomach, IBD, or bleeding problems) °Ibuprofen 200mg tabs (ex. Advil, Motrin) 3-4 pills with every meal and just before bedtime (avoid if you have kidney, stomach, IBD, or bleeding problems) °Take with food/snack several times a day as directed for at least 2 weeks to help keep pain / soreness down & more manageable. °5. Take Narcotic prescription pain medication for more severe pain °A prescription for strong pain control is often given to you upon discharge (for example: oxycodone/Percocet, hydrocodone/Norco/Vicodin, or tramadol/Ultram) °Take your pain medication as prescribed. °Be mindful that most narcotic prescriptions contain Tylenol (acetaminophen) as well - avoid taking too much Tylenol. °If you are having problems/concerns with the prescription medicine (does not control pain, nausea, vomiting, rash, itching, etc.), please call us [phone] to see if we need to switch you to a different pain medicine that will work better for you and/or control your side effects better. °If you need a refill on your pain medication, you must call the office before 4 pm and on weekdays only.  By federal law, prescriptions for narcotics cannot be called into a pharmacy.  They must be filled out on paper & picked up from our office by the patient or authorized caretaker.  Prescriptions cannot be filled after 4 pm nor on weekends.   ° °WHEN TO CALL US [phone] °Severe uncontrolled or worsening pain  °Fever over 101 F (38.5 C) °Concerns with  the incision: Worsening pain, redness, rash/hives, swelling, bleeding, or drainage °Reactions / problems with new medications (itching, rash, hives, nausea, etc.) °Nausea and/or vomiting °Difficulty urinating °Difficulty breathing °Worsening fatigue, dizziness, lightheadedness, blurred vision °Other concerns °If you are not getting better after two weeks or are noticing you are getting worse, contact our office [phone] for further advice.  We may need to adjust your medications, re-evaluate you in the office, send you to the emergency room, or see what other things we can do to help. °The   clinic staff is available to answer your questions during regular business hours (8:30am-5pm).  Please don’t hesitate to call and ask to speak to one of our nurses for clinical concerns.    °A surgeon from [facility] is always on call at the hospitals 24 hours/day °If you have a medical emergency, go to the nearest emergency room or call 911. ° °FOLLOW UP in our office °One the day of your discharge from the hospital (or the next business weekday), please call [facility] to set up or confirm an appointment to see your surgeon in the office for a follow-up appointment.  Usually it is 2-3 weeks after your surgery.   °If you have skin staples at your incision(s), let the office know so we can set up a time in the office for the nurse to remove them (usually around 10 days after surgery). °Make sure that you call for appointments the day of discharge (or the next business weekday) from the hospital to ensure a convenient appointment time. °IF YOU HAVE DISABILITY OR FAMILY LEAVE FORMS, BRING THEM TO THE OFFICE FOR PROCESSING.  DO NOT GIVE THEM TO YOUR DOCTOR. ° °[facility] °[address], [address], [place],   [address] ? °[phone] - Main °[phone] - Toll Free,  [phone] - Fax °[email or web address] ° °GETTING TO GOOD BOWEL HEALTH. °It is  expected for your digestive tract to need a few months to get back to normal.  It is common for your bowel movements and stools to be irregular.  You will have occasional bloating and cramping that should eventually fade away.  Until you are eating solid food normally, off all pain medications, and back to regular activities; your bowels will not be normal.   °Avoiding constipation °The goal: ONE SOFT BOWEL MOVEMENT A DAY!    °Drink plenty of fluids.  Choose water first. °TAKE A FIBER SUPPLEMENT EVERY DAY THE REST OF YOUR LIFE °During your first week back home, gradually add back a fiber supplement every day °Experiment which form you can tolerate.   There are many forms such as powders, tablets, wafers, gummies, etc °Psyllium bran (Metamucil), methylcellulose (Citrucel), Miralax or Glycolax, Benefiber, Flax Seed.  °Adjust the dose week-by-week (1/2 dose/day to 6 doses a day) until you are moving your bowels 1-2 times a day.  Cut back the dose or try a different fiber product if it is giving you problems such as diarrhea or bloating. °Sometimes a laxative is needed to help jump-start bowels if constipated until the fiber supplement can help regulate your bowels.  If you are tolerating eating & you are farting, it is okay to try a gentle laxative such as double dose MiraLax, prune juice, or Milk of Magnesia.  Avoid using laxatives too often. °Stool softeners can sometimes help counteract the constipating effects of narcotic pain medicines.  It can also cause diarrhea, so avoid using for too long. °If you are still constipated despite taking fiber daily, eating solids, and a few doses of laxatives, call our office. °Controlling diarrhea °Try drinking liquids and eating bland foods for a few days to avoid stressing your intestines further. °Avoid dairy products (especially milk & ice cream) for a short time.  The intestines often can lose the ability to digest lactose when stressed. °Avoid foods that cause gassiness or  bloating.  Typical foods include beans and other legumes, cabbage, broccoli, and dairy foods.  Avoid greasy, spicy, fast foods.  Every person has   some sensitivity to other foods, so listen to your body and avoid those foods that trigger problems for you. Probiotics (such as active yogurt, Align, etc) may help repopulate the intestines and colon with normal bacteria and calm down a sensitive digestive tract Adding a fiber supplement gradually can help thicken stools by absorbing excess fluid and retrain the intestines to act more normally.  Slowly increase the dose over a few weeks.  Too much fiber too soon can backfire and cause cramping & bloating. It is okay to try and slow down diarrhea with a few doses of antidiarrheal medicines.   Bismuth subsalicylate (ex. Kayopectate, Pepto Bismol) for a few doses can help control diarrhea.  Avoid if pregnant.   Loperamide (Imodium) can slow down diarrhea.  Start with one tablet (2mg ) first.  Avoid if you are having fevers or severe pain.  ILEOSTOMY PATIENTS WILL HAVE CHRONIC DIARRHEA since their colon is not in use.    Drink plenty of liquids.  You will need to drink even more glasses of water/liquid a day to avoid getting dehydrated. Record output from your ileostomy.  Expect to empty the bag every 3-4 hours at first.  Most people with a permanent ileostomy empty their bag 4-6 times at the least.   Use antidiarrheal medicine (especially Imodium) several times a day to avoid getting dehydrated.  Start with a dose at bedtime & breakfast.  Adjust up or down as needed.  Increase antidiarrheal medications as directed to avoid emptying the bag more than 8 times a day (every 3 hours). Work with your wound ostomy nurse to learn care for your ostomy.  See ostomy care instructions. TROUBLESHOOTING IRREGULAR BOWELS 1) Start with a soft & bland diet. No spicy, greasy, or fried foods.  2) Avoid gluten/wheat or dairy products from diet to see if symptoms improve. 3) Miralax  17gm or flax seed mixed in [place]. water or juice-daily. May use 2-4 times a day as needed. 4) Gas-X, Phazyme, etc. as needed for gas & bloating.  5) Prilosec (omeprazole) over-the-counter as needed 6)  Consider probiotics (Align, Activa, etc) to help calm the bowels down  Call your doctor if you are getting worse or not getting better.  Sometimes further testing (cultures, endoscopy, X-ray studies, CT scans, bloodwork, etc.) may be needed to help diagnose and treat the cause of the diarrhea. [facility], [place], [place], [place], [place]  [address] [phone] - Main.    [phone]  - Toll Free.   [phone] - Fax [email or web address]    Colorectal Cancer  Colorectal cancer is an abnormal growth of cells and tissue (tumor) in the colon or rectum, which are parts of the large intestine. The cancer can spread (metastasize) to other parts of the body. What are the causes? Most cases of colorectal cancer start as abnormal growths called polyps on the inner wall of the colon or rectum. Other times, abnormal changes to genes (genetic mutations) can cause cells to form cancer. What increases the risk? You are more likely to develop this condition if:  You are older than age 30.  You have multiple polyps in the colon or rectum.  You have diabetes.  You are African American.  You have a family history of Lynch syndrome.  You have had cancer before.  You have certain hereditary conditions, such as: ? Familial adenomatous polyposis. ? Turcot syndrome. ? Peutz-Jeghers syndrome.  You eat a diet that is high in fat (especially animal fat)  and low in fiber, fruits, and vegetables.  You have an inactive (sedentary) lifestyle.  You have an inflammatory bowel disease or Crohn's disease.  You smoke.  You drink alcohol excessively. What are the signs or symptoms? Early colorectal cancer often does not cause symptoms. As the cancer grows,  symptoms may include:  Changes in bowel habits.  Feeling like the bowel does not empty completely after a bowel movement.  Stools that are narrower than usual.  Blood in the stool.  Diarrhea.  Constipation.  Anemia.  Discomfort, pain, bloating, fullness, or cramps in the abdomen.  Frequent gas pain.  Unexplained weight loss.  Constant fatigue.  Nausea and vomiting. How is this diagnosed? This condition may be diagnosed with:  A medical history.  A physical exam.  Tests. These may include: ? A digital rectal exam. ? A stool test called a fecal occult blood test. ? Blood tests. ? A test in which a tissue sample is taken from the colon or rectum and examined under a microscope (biopsy). ? Imaging tests, such as:  X-rays.  A barium enema.  CT scans.  MRIs.  A sigmoidoscopy. This test is done to view the inside of the rectum.  A colonoscopy. This test is done to view the inside of the colon. During this test, small polyps can be removed or biopsies may be taken.  An endorectal ultrasound. This test checks how deep a tumor in the rectum has grown and whether the cancer has spread to lymph nodes or other nearby tissues. Your health care provider may order additional tests to find out whether the cancer has spread to other parts of the body (what stage it is). The stages of cancer include:  Stage 0. At this stage, the cancer is found only in the innermost lining of the colon or rectum.  Stage I. At this stage, the cancer has grown into the inner wall of the colon or rectum.  Stage II. At this stage, the cancer has gone more deeply into the wall of the colon or rectum or through the wall. It may have invaded nearby tissue.  Stage III. At this stage, the cancer has spread to nearby lymph nodes.  Stage IV. At this stage, the cancer has spread to other parts of the body, such as the liver or lungs. How is this treated? Treatment for this condition depends on the  type and stage of the cancer. Treatment may include:  Surgery. In the early stages of the cancer, surgery may be done to remove polyps or small tumors from the colon. In later stages, surgery may be done to remove part of the colon.  Chemotherapy. This treatment uses medicines to kill cancer cells.  Targeted therapy. This treatment targets specific gene mutations or proteins that the cancer expresses in order to kill tumor cells.  Radiation therapy. This treatment uses radiation to kill cancer cells or shrink tumors.  Radiofrequency ablation. This treatment uses radio waves to destroy the tumors that may have spread to other areas of the body, such as the liver. Follow these instructions at home:  Take over-the-counter and prescription medicines only as told by your health care provider.  Try to eat regular, healthy meals. Some of your treatments might affect your appetite. If you are having problems eating or with your appetite, ask to meet with a food and nutrition specialist (dietitian).  Consider joining a support group. This may help you learn about your diagnosis and cope with the stress of  having colorectal cancer.  If you are admitted to the hospital, inform your cancer care team.  Keep all follow-up visits as told by your health care provider. This is important. How is this prevented?  Colorectal cancer can be prevented with screening tests that find polyps so they can be removed before they develop into cancer.  All adults should have screening for colorectal cancer starting at age 53 and continuing until age 49. Your health care provider may recommend screening at age 58. People at increased risk should start screening at an earlier age. Where to find more information  American Cancer Society: https://www.cancer.[place] https://www.cancer.gov Contact a health care provider if:  Your diarrhea or constipation does not go away.  You have blood  in your stool.  Your bowel habits change.  You have increased pain in your abdomen.  You notice new fatigue or weakness.  You lose weight. Get help right away if:  You have increased bleeding from your rectum.  You have any uncontrollable or severe abdomen (abdominal) symptoms. Summary  Colorectal cancer is an abnormal growth of cells and tissue (tumor) in the colon or rectum.  Common risk factors for this condition include having a relative with colon cancer, being older in age, having an inflammatory bowel disease, and being African American.  This condition may be diagnosed with tests, such as a colonoscopy and biopsy.  Treatment depends on the type and stage of the cancer. Commonly, treatment includes surgery to remove the tumor along with chemotherapy or targeted therapy.  Keep all follow-up visits as told by your health care provider. This is important. This information is not intended to replace advice given to you by your health care provider. Make sure you discuss any questions you have with your health care provider. Document Released: 11/09/2005 Document Revised: 12/30/2017 Document Reviewed: 12/11/2016 Elsevier Patient Education  2020 [employer].

## 2019-10-11 NOTE — Interval H&P Note (Signed)
History and Physical Interval Note:  10/11/2019 7:53 AM  Jessica Taylor  has presented today for surgery, with the diagnosis of COLON CANCER.  The various methods of treatment have been discussed with the patient and family. After consideration of risks, benefits and other options for treatment, the patient has consented to  Procedure(s): XI [place] (N/A) as a surgical intervention.  The patient's history has been reviewed, patient examined, no change in status, stable for surgery.  I have reviewed the patient's chart and labs.  Questions were answered to the patient's satisfaction.    I have re-reviewed the the patient's records, history, medications, and allergies.  I have re-examined the patient.  I again discussed intraoperative plans and goals of post-operative recovery.  The patient agrees to proceed.  Jessica Taylor  07-01-1941 [id number]  Patient Care Team: Antony Contras, MD as PCP - General (Family Medicine) Jerline Pain, MD as PCP - Cardiology (Cardiology) Michael Boston, MD as Consulting Physician (General Surgery) Jerline Pain, MD as Consulting Physician (Cardiology) Mansouraty, Telford Nab., MD as Consulting Physician (Gastroenterology)  Patient Active Problem List   Diagnosis Date Noted  . Abnormal nuclear stress test   . Microcytic anemia 07/23/2019  . BRBPR (bright red blood per rectum) 07/23/2019  . Lower abdominal pain 07/23/2019  . Anorexia 07/23/2019  . Abnormal CT of the abdomen 07/23/2019  . Iron deficiency anemia 09/13/2018  . Acute blood loss anemia   . Acute renal failure with acute tubular necrosis superimposed on stage 2 chronic kidney disease ([facility])   . Lt Acute pyelonephritis 09/11/2018  . Pyelonephritis 09/11/2018  . 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 CAD (coronary artery disease)   . Coronary artery disease involving native coronary artery of native heart without angina pectoris   . Unstable angina ([place])   . Chest pain 01/17/2016   . Hematuria 01/17/2016  . Coronary artery disease involving native coronary artery of native heart with unstable angina pectoris ([place])   . Claudication ([place]) 06/27/2015  . Acute renal insufficiency post PCI- SCr1.34 03/08/2015  . STEMI- urgent CFX PCI 03/06/15 03/06/2015  . 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 Hypokalemia 03/06/2015  . Hyperglycemia 03/06/2015  . PAD (peripheral artery disease) ([place]) 03/06/2015  . Aortoiliac occlusive disease ([place])   . Elevated troponin 02/10/2015  . Gastroesophageal reflux disease 02/08/2015  . CAD S/P prior PCI to LAD and RCA 02/08/2015  . Hypothyroidism 02/08/2015  . Hypoxia 02/08/2015  . Status post right partial knee replacement   . Primary osteoarthritis of right knee 02/04/2015  . 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 Atherosclerosis of native coronary artery of native heart without angina pectoris 06/06/2014  . Mixed hyperlipidemia 06/06/2014  . Essential hypertension 06/06/2014    Past Medical History:  Diagnosis Date  . Abdominal aortic aneurysm ([place])   . Acid reflux   . Anemia   . Arthritis    knees , R shoulder - tx /w injection - 11/2014  . Basal cell carcinoma (BCC) of dorsum of nose 2010   Resolved  . Cancer ([place])    basal cell on nose  . Coronary artery disease   . Hypercholesteremia   . Hypertension   . Hypothyroidism   . MI, old 48  . Peripheral arterial disease ([facility])    hhigh-grade ostial bilateral calcified iliac stenosis with claudication  . Tobacco abuse   . Vertigo    when lays on left  side.    Past Surgical History:  Procedure Laterality Date  . CARDIAC CATHETERIZATION  5 stents  . CARDIAC CATHETERIZATION N/A 01/20/2016   Procedure: Left Heart Cath and Coronary Angiography;  Surgeon: Peter M Martinique, MD;  Location: [place] [facility];  Service: Cardiovascular;  Laterality: N/A;  . CATARACT EXTRACTION W/PHACO Left 05/24/2014   Procedure: CATARACT EXTRACTION PHACO AND INTRAOCULAR LENS  PLACEMENT (IOC);  Surgeon: Tonny Branch, MD;  Location: [facility];  Service: Ophthalmology;  Laterality: Left;  CDE:  9.30  . CATARACT EXTRACTION W/PHACO Right 06/18/2014   Procedure: CATARACT EXTRACTION PHACO AND INTRAOCULAR LENS PLACEMENT RIGHT EYE CDE=10.84;  Surgeon: Tonny Branch, MD;  Location: [facility];  Service: Ophthalmology;  Laterality: Right;  . CORONARY ARTERY BYPASS GRAFT N/A 01/24/2016   Procedure: CORONARY ARTERY BYPASS GRAFTING (CABG);  Surgeon: Gaye Pollack, MD;  Location: [facility];  Service: Open Heart Surgery;  Laterality: N/A;  . CORONARY STENT PLACEMENT    . CORONARY STENT PLACEMENT  03/06/15   CFX DES  . CYSTOSCOPY/URETEROSCOPY/HOLMIUM LASER/STENT PLACEMENT Left 09/12/2018   Procedure: CYSTOSCOPY/URETEROSCOPY/STENT PLACEMENT;  Surgeon: Ceasar Mons, MD;  Location: [facility];  Service: Urology;  Laterality: Left;  . EYE SURGERY    . LEFT HEART CATH AND CORS/GRAFTS ANGIOGRAPHY N/A 09/12/2019   Procedure: LEFT HEART CATH AND CORS/GRAFTS ANGIOGRAPHY;  Surgeon: Troy Sine, MD;  Location: [facility];  Service: Cardiovascular;  Laterality: N/A;  . LEFT HEART CATHETERIZATION WITH CORONARY ANGIOGRAM N/A 03/06/2015   Procedure: LEFT HEART CATHETERIZATION WITH CORONARY ANGIOGRAM;  Surgeon: Lorretta Harp, MD;  Location: [facility];  Service: Cardiovascular;  Laterality: N/A;  . PARTIAL KNEE ARTHROPLASTY Right 02/04/2015   Procedure: UNICOMPARTMENTAL KNEE;  Surgeon: Dorna Leitz, MD;  Location: [facility];  Service: Orthopedics;  Laterality: Right;  . PERIPHERAL VASCULAR CATHETERIZATION Bilateral 05/13/2015   Procedure: Lower Extremity Angiography;  Surgeon: Lorretta Harp, MD;  Location: [facility];  Service: Cardiovascular;  Laterality: Bilateral;  . PERIPHERAL VASCULAR CATHETERIZATION N/A 05/13/2015   Procedure: Abdominal Aortogram;  Surgeon: Lorretta Harp, MD;  Location: [facility];  Service: Cardiovascular;  Laterality: N/A;  . PERIPHERAL VASCULAR  CATHETERIZATION Bilateral 06/27/2015   Procedure: Peripheral Vascular Intervention;  Surgeon: Lorretta Harp, MD;  Location: [place] [facility];  Service: Cardiovascular;  Laterality: Bilateral;  ILIACS  . PERIPHERAL VASCULAR CATHETERIZATION Bilateral 06/27/2015   Procedure: Peripheral Vascular Atherectomy;  Surgeon: Lorretta Harp, MD;  Location: [place] [facility];  Service: Cardiovascular;  Laterality: Bilateral;  . TEE WITHOUT CARDIOVERSION N/A 01/24/2016   Procedure: TRANSESOPHAGEAL ECHOCARDIOGRAM (TEE);  Surgeon: Gaye Pollack, MD;  Location: [facility];  Service: Open Heart Surgery;  Laterality: N/A;  . TONSILLECTOMY     age 36  . TUBAL LIGATION      Social History   Socioeconomic History  . Marital status: Married    Spouse name: Not on file  . Number of children: 4  . Years of education: Not on file  . Highest education level: Not on file  Occupational History  . Occupation: retired    Comment: worked as Presenter, broadcasting for [employer]  Social Needs  . Financial resource strain: Not hard at all  . Food insecurity    Worry: Never true    Inability: Never true  . Transportation needs    Medical: No    Non-medical: No  Tobacco Use  . Smoking status: Former Smoker    Packs/day: 1.50  Years: 42.00    Pack years: 63.00    Types: Cigarettes    Quit date: 05/19/1999    Years since quitting: 20.4  . Smokeless tobacco: Never Used  Substance and Sexual Activity  . Alcohol use: No  . Drug use: No  . Sexual activity: Yes    Birth control/protection: Surgical  Lifestyle  . Physical activity    Days per week: Patient refused    Minutes per session: Patient refused  . Stress: Only a little  Relationships  . Social connections    Talks on phone: More than three times a week    Gets together: More than three times a week    Attends religious service: More than 4 times per year    Active member of club or organization: No    Attends meetings of clubs or organizations:  Never    Relationship status: Married  . Intimate partner violence    Fear of current or ex partner: No    Emotionally abused: No    Physically abused: No    Forced sexual activity: No  Other Topics Concern  . Not on file  Social History Narrative  . Not on file    Family History  Problem Relation Age of Onset  . Ovarian cancer Mother 55  . Cancer Father 19       unsure of which kind, "it was in his glands"  . Hypertension Maternal Grandmother   . Stroke Maternal Grandfather   . Hypertension Son   . Heart attack Neg Hx   . Colon cancer Neg Hx   . Esophageal cancer Neg Hx   . Inflammatory bowel disease Neg Hx   . Liver disease Neg Hx   . Pancreatic cancer Neg Hx   . Rectal cancer Neg Hx   . Stomach cancer Neg Hx     Medications Prior to Admission  Medication Sig Dispense Refill Last Dose  . aspirin EC 81 MG tablet Take 81 mg by mouth every evening.    10/10/2019 at Unknown time  . atorvastatin (LIPITOR) 40 MG tablet TAKE (1) TABLET BY MOUTH ONCE DAILY. (Patient taking differently: Take 40 mg by mouth daily. ) 90 tablet 0 10/10/2019 at Unknown time  . clopidogrel (PLAVIX) 75 MG tablet TAKE (1) TABLET BY MOUTH ONCE DAILY. (Patient taking differently: Take 75 mg by mouth daily. ) 90 tablet 3 10/05/2019  . esomeprazole (NEXIUM) 20 MG capsule Take 20 mg by mouth daily.    10/11/2019 at Unknown time  . HYDROcodone-acetaminophen (NORCO/VICODIN) 5-325 MG tablet Take 1 tablet by mouth every 6 (six) hours as needed for moderate pain.   Past Week at Unknown time  . levothyroxine (SYNTHROID, LEVOTHROID) 50 MCG tablet Take 50 mcg by mouth daily before breakfast.   10/11/2019 at Unknown time  . metoprolol tartrate (LOPRESSOR) 25 MG tablet Take 1 tablet (25 mg total) by mouth 2 (two) times daily. 180 tablet 3 10/11/2019 at 0500  . nitroGLYCERIN (NITROSTAT) 0.4 MG SL tablet Place 1 tablet (0.4 mg total) under the tongue every 5 (five) minutes as needed for chest pain. 25 tablet prn      Current Facility-Administered Medications  Medication Dose Route Frequency Provider Last Rate Last Dose  . bisacodyl (DULCOLAX) EC tablet 20 mg  20 mg Oral Once Michael Boston, MD      . bupivacaine liposome (EXPAREL) 1.3 % injection 266 mg  20 mL Infiltration Once Michael Boston, MD      . cefoTEtan (CEFOTAN)  2 g in sodium chloride 0.9 % 100 mL IVPB  2 g Intravenous On Call to OR Michael Boston, MD      . clindamycin (CLEOCIN) 900 mg, gentamicin (GARAMYCIN) 240 mg in sodium chloride 0.9 % 1,000 mL for intraperitoneal lavage   Irrigation To OR Michael Boston, MD      . neomycin (MYCIFRADIN) tablet 1,000 mg  1,000 mg Oral 3 times per day Michael Boston, MD       And  . metroNIDAZOLE (FLAGYL) tablet 1,000 mg  1,000 mg Oral 3 times per day Michael Boston, MD      . polyethylene glycol powder (GLYCOLAX/MIRALAX) container 255 g  1 Container Oral Once Michael Boston, MD         Allergies  Allergen Reactions  . Shrimp [Shellfish Allergy] Nausea And Vomiting    Throws up violently    BP (!) 148/74   Pulse 65   Temp 97.6 F (36.4 C) (Oral)   Resp 16   SpO2 100%   Labs: Results for orders placed or performed during the hospital encounter of 10/10/19 (from the past 48 hour(s))  CBC     Status: Abnormal   Collection Time: 10/10/19  3:06 PM  Result Value Ref Range   WBC 9.2 4.0 - 10.5 K/uL   RBC 4.74 3.87 - 5.11 MIL/uL   Hemoglobin 11.9 (L) 12.0 - 15.0 g/dL   HCT 41.4 36.0 - 46.0 %   MCV 87.3 80.0 - 100.0 fL   MCH 25.1 (L) 26.0 - 34.0 pg   MCHC 28.7 (L) 30.0 - 36.0 g/dL   RDW 17.7 (H) 11.5 - 15.5 %   Platelets 285 150 - 400 K/uL   nRBC 0.0 0.0 - 0.2 %    Comment: Performed at [facility], [place] [address] [place], [place] [address]  Basic metabolic panel     Status: Abnormal   Collection Time: 10/10/19  3:06 PM  Result Value Ref Range   Sodium 141 135 - 145 mmol/L   Potassium 4.0 3.5 - 5.1 mmol/L   Chloride 105 98 - 111 mmol/L   CO2 27 22 - 32 mmol/L   Glucose,  Bld 114 (H) 70 - 99 mg/dL   BUN 16 8 - 23 mg/dL   Creatinine, Ser 1.42 (H) 0.44 - 1.00 mg/dL   Calcium 9.8 8.9 - 10.3 mg/dL   GFR calc non Af Amer 35 (L) >60 mL/min   GFR calc Af Amer 40 (L) >60 mL/min   Anion gap 9 5 - 15    Comment: Performed at [facility] [place] [address], [place], [place] [address]  Hemoglobin A1c     Status: None   Collection Time: 10/10/19  3:06 PM  Result Value Ref Range   Hgb A1c MFr Bld 5.6 4.8 - 5.6 %    Comment: (NOTE) Pre diabetes:          5.7%-6.4% Diabetes:              >6.4% Glycemic control for   <7.0% adults with diabetes    Mean Plasma Glucose 114.02 mg/dL    Comment: Performed at [place] [address] [place], [place] [address]    Imaging / Studies: No results found.   Adin Hector, M.D., F.A.C.S. Gastrointestinal and Minimally Invasive Surgery [facility] [address], [place], [place] [address] [phone] Main / Paging  10/11/2019 7:54 AM    Revonda Standard  Johney [place]

## 2019-10-11 NOTE — Transfer of Care (Signed)
Immediate Anesthesia Transfer of Care Note  Patient: Jessica Taylor  Procedure(s) Performed: XI ROBOTIC ASSISTED RIGHT PROXIMAL COLECTOMY WITH BILATERAL TAP BLOCK (N/A Abdomen)  Patient Location: PACU  Anesthesia Type:General  Level of Consciousness: awake, alert  and oriented  Airway & Oxygen Therapy: Patient Spontanous Breathing and Patient connected to face mask oxygen  Post-op Assessment: Report given to RN and Post -op Vital signs reviewed and stable  Post vital signs: Reviewed and stable  Last Vitals:  Vitals Value Taken Time  BP 162/87 10/11/19 1148  Temp    Pulse 66 10/11/19 1150  Resp 10 10/11/19 1150  SpO2 100 % 10/11/19 1150  Vitals shown include unvalidated device data.  Last Pain:  Vitals:   10/11/19 0738  TempSrc:   PainSc: 0-No pain         Complications: No apparent anesthesia complications

## 2019-10-11 NOTE — Op Note (Signed)
10/11/2019  11:39 AM  PATIENT:  Jessica Taylor  78 y.o. female  Patient Care Team: Antony Contras, MD as PCP - General (Family Medicine) Jerline Pain, MD as PCP - Cardiology (Cardiology) Michael Boston, MD as Consulting Physician (General Surgery) Jerline Pain, MD as Consulting Physician (Cardiology) Mansouraty, Telford Nab., MD as Consulting Physician (Gastroenterology)  PRE-OPERATIVE DIAGNOSIS:  ASCENDING COLON CANCER  POST-OPERATIVE DIAGNOSIS:  ASCENDING COLON CANCER  PROCEDURE:  XI ROBOTIC ASSISTED RIGHT PROXIMAL COLECTOMY TAP BLOCK - BILATERAL  SURGEON:  Adin Hector, MD  ASSISTANT: Carlena Hurl, PA-C An experienced assistant was required given the standard of surgical care given the complexity of the case.  This assistant was needed for exposure, dissection, suctioning, retraction, instrument exchange, etc.  ANESTHESIA:     General  Nerve block provided with liposomal bupivacaine (Experel) mixed with 0.25% bupivacaine as a Bilateral TAP block x 13mL each side at the level of the transverse abdominis & preperitoneal spaces along the flank at the anterior axillary line, from subcostal ridge to iliac crest under laparoscopic guidance   Local field block at port sites & extraction wound  EBL:  Total I/O In: 1250 [I.V.:1000; IV Piggyback:250] Out: 150 [Urine:100; Blood:50]  Delay start of Pharmacological VTE agent (>24hrs) due to surgical blood loss or risk of bleeding:  no  DRAINS: none   SPECIMEN:  PROXIMAL "RIGHT" COLON  DISPOSITION OF SPECIMEN:  PATHOLOGY  COUNTS:  YES  PLAN OF CARE: Admit to inpatient   PATIENT DISPOSITION:  PACU - hemodynamically stable.  INDICATION:    Woman with symptomatic anemia.  Underwent endoscopy and found to have bulky tumor in ascending colon.  I recommended segmental resection:  The anatomy & physiology of the digestive tract was discussed.  The pathophysiology was discussed.  Natural history risks without surgery was  discussed.   I worked to give an overview of the disease and the frequent need to have multispecialty involvement.  I feel the risks of no intervention will lead to serious problems that outweigh the operative risks; therefore, I recommended a partial colectomy to remove the pathology.  Laparoscopic & open techniques were discussed.   Risks such as bleeding, infection, abscess, leak, reoperation, possible ostomy, hernia, heart attack, death, and other risks were discussed.  I noted a good likelihood this will help address the problem.   Goals of post-operative recovery were discussed as well.  We will work to minimize complications.  An educational handout on the pathology was given as well.  Questions were answered.    The patient expresses understanding & wishes to proceed with surgery.  OR FINDINGS:   Patient had pulled hard rocky ascending colon cancer with transverse colon adherent to it.  No obvious metastatic disease on visceral parietal peritoneum or liver.  It is an ileocolonic anastomosis that rests in the epigastric region.  DESCRIPTION:   Informed consent was confirmed.  The patient underwent general anaesthesia without difficulty.  The patient was positioned with arms tucked & secured appropriately.  VTE prevention in place.  The patient's abdomen was clipped, prepped, & draped in a sterile fashion.  Surgical timeout confirmed our plan.  The patient was positioned in reverse Trendelenburg.  Abdominal entry was gained using Varess technique at the left subcostal ridge on the anterior abdominal wall.  No elevated EtCO2 noted.  Port placed.  Camera inspection revealed no injury.  Extra ports were carefully placed under direct laparoscopic visualization.  We docked the [employer] robot carefully and placed  intstruments under visualization  I mobilized & reflected the greater omentum and small bowel in the upper abdomen.  There was a large bulky mass of the ascending colon.  Somewhat  fixed.  I was able to elevate the proximal colon to isolate the ileocolonic pedicle.  I scored the ileal mesentery just proximal to that.   I carried that further dissection in a medial to lateral fashion.  I was able to bluntly get into the retro-mesenteric plane on the right side.  I freed the proximal right sided colonic mesentery off the retroperitoneum including the duodenal sweep, pancreatic head, & Gerota's fascia of the right kidney.  Because of the bulkiness of the tumor, I did transect some of the fatty Gerota's fascia to make sure there was a negative deep/posterior margin.   We then freed the greater omentum off the mid transverse colon and came towards the hepatic flexure.  I ended up including the hepatic flexure greater omentum with the specimen to avoid a positive anterior margin.  I did end up connecting with my retroperitoneal dissection up near the hepatic flexure corner.  I was able to get underneath the hepatic flexure.  I was able to get underneath the proximal and mid transverse colon.  I isolated the proximal ileocecal pedicle.  I skeletonized it & transected the vessels.  I then proceeded to mobilize the terminal ileum & proximal "right" colon in a lateral to medial fashion.  I mobilized the distal ileal mesentery off its retroperitoneal and pelvic attachments.  I mobilized the ascending colon off It is side wall attachments to the paracolic gutter and retroperitoneum. This allowed me to mobilize the hepatic flexure and get a complete mobilization of the proximal "right" colon to the mid-transverse colon..  I could isolate the pathology. When he went ahead and proceeded with transection.  I transected the distal ileal mesentery and then transected at the distal ileum with a robotic stapler.  I then transected transverse colon mesentery just proximal to a dominant middle colic arterial pedicle radially.  Because the proximal transverse colon was adherent to the tumor, I went to the mid  transverse colon.  We asked anesthesia to dilute the indocyanine green (ICG) to 10 mL and inject 3 mL intravenously with IV flush.  I switched to the NIR fluorescence (Firefly mode) imaging window on the daVinci platform.  I was able to see good light green visualization of blood vessels with good perfusion of tissues, confirming good tissue perfusion of both the distal ileum and mid transverse colon planned for anastomosis.  Transected at the mid transverse colon with a robotic stapler.  We assured hemostasis.   I did a side-to-side stapled anastomosis of ileum to mid-transverse colon using a 73mm robotic stapler x 2 firings in an isoperistaltic fashion.  (Distal stump of ileum to mid transverse colon for the distal end of the anastomosis.  Proximal end of colon stump to more proximal ileum for the proximal end of the anastomosis).  I sewed the common staple channel wound with an absorbable suture ( 2-0 V-lock) in a running Green fashion from each corner and meeting in the center.  I did meticulous inspection prove an airtight closure.  I protected the anastomosis line with an anterior omentopexy of greater omentum using V lock suture.    Irrigated the abdomen with isotonic solution.  Thin bloody return.  We did reinspection of the abdomen.  Hemostasis was good.   Ureters, retroperitoneum, and bowel uninjured.  The anastomosis looked  healthy.   We did a final irrigation of antibiotic solution (900 mg clindamycin/240 mg gentamicin in a liter of crystalloid) & held that.  Robot undocked.  Evacuated carbon dioxide through the ports.  We we placed the wound protector through the 23mm port site after it was enlarged in a Pfannenstiel fashion.  Specimen removed without incident.  Ports removed.  We changed gloves & redraped the patient per colon SSI prevention protocol.  We aspirated the antibiotic irrigation.  Hemostasis was good.  Sterile unused instruments were used from this point.  I closed the skin at the  port sites using Monocryl stitch and sterile dressing.  I closed the extraction wound using a 0 Vicryl vertical peritoneal closure and a #1 PDS transverse anterior rectal fascial closure like a small Pfannenstiel closure. I closed the skin with some interrupted Monocryl stitches.  I placed sterile dressings.     Patient is being extubated go to recovery room. I discussed postop care with the patient in detail the office & in the holding area. Instructions are written.  I made an attempt to locate family to discuss patient's status and recommendations.  Called the patient's daughter on her phone with no answer.  No one is available at this time.  We will try again later   Adin Hector, M.D., F.A.C.S. Gastrointestinal and Minimally Invasive Surgery [facility] [address], [place], [place] [address] [phone] Main / Paging

## 2019-10-11 NOTE — Anesthesia Procedure Notes (Signed)
Procedure Name: Intubation Date/Time: 10/11/2019 8:41 AM Performed by: Maxwell Caul, CRNA Pre-anesthesia Checklist: Patient identified, Emergency Drugs available, Suction available and Patient being monitored Patient Re-evaluated:Patient Re-evaluated prior to induction Oxygen Delivery Method: Circle system utilized Preoxygenation: Pre-oxygenation with 100% oxygen Induction Type: IV induction Ventilation: Mask ventilation without difficulty Laryngoscope Size: Mac and 3 Grade View: Grade I Tube type: Oral Tube size: 7.0 mm Number of attempts: 1 Airway Equipment and Method: Stylet Placement Confirmation: ETT inserted through vocal cords under direct vision,  positive ETCO2 and breath sounds checked- equal and bilateral Secured at: 21 cm Tube secured with: Tape Dental Injury: Teeth and Oropharynx as per pre-operative assessment

## 2019-10-12 LAB — BASIC METABOLIC PANEL
Anion gap: 9 (ref 5–15)
BUN: 16 mg/dL (ref 8–23)
CO2: 23 mmol/L (ref 22–32)
Calcium: 8.8 mg/dL — ABNORMAL LOW (ref 8.9–10.3)
Chloride: 103 mmol/L (ref 98–111)
Creatinine, Ser: 1.38 mg/dL — ABNORMAL HIGH (ref 0.44–1.00)
GFR calc Af Amer: 42 mL/min — ABNORMAL LOW (ref >60)
GFR calc non Af Amer: 36 mL/min — ABNORMAL LOW (ref >60)
Glucose, Bld: 135 mg/dL — ABNORMAL HIGH (ref 70–99)
Potassium: 4.4 mmol/L (ref 3.5–5.1)
Sodium: 135 mmol/L (ref 135–145)

## 2019-10-12 LAB — CBC
HCT: 30.3 % — ABNORMAL LOW (ref 36.0–46.0)
Hemoglobin: 8.9 g/dL — ABNORMAL LOW (ref 12.0–15.0)
MCH: 25.6 pg — ABNORMAL LOW (ref 26.0–34.0)
MCHC: 29.4 g/dL — ABNORMAL LOW (ref 30.0–36.0)
MCV: 87.1 fL (ref 80.0–100.0)
Platelets: 229 10*3/uL (ref 150–400)
RBC: 3.48 MIL/uL — ABNORMAL LOW (ref 3.87–5.11)
RDW: 17.6 % — ABNORMAL HIGH (ref 11.5–15.5)
WBC: 12.6 10*3/uL — ABNORMAL HIGH (ref 4.0–10.5)
nRBC: 0 % (ref 0.0–0.2)

## 2019-10-12 LAB — MAGNESIUM: Magnesium: 1.8 mg/dL (ref 1.7–2.4)

## 2019-10-12 MED ORDER — MECLIZINE HCL 25 MG PO TABS: 12.5000 mg | ORAL_TABLET | Freq: Three times a day (TID) | ORAL | Status: DC | PRN

## 2019-10-12 MED ORDER — SCOPOLAMINE 1 MG/3DAYS TD PT72
1.0000 | MEDICATED_PATCH | TRANSDERMAL | Status: DC
  Administered 2019-10-12: 09:00:00 1.5 mg via TRANSDERMAL
  Filled 2019-10-12: qty 1

## 2019-10-12 NOTE — Progress Notes (Signed)
Jessica Taylor [id number] 1941-03-30  CARE TEAM:  PCP: Antony Contras, MD  Outpatient Care Team: Patient Care Team: Antony Contras, MD as PCP - General (Family Medicine) Jerline Pain, MD as PCP - Cardiology (Cardiology) Michael Boston, MD as Consulting Physician (General Surgery) Jerline Pain, MD as Consulting Physician (Cardiology) Mansouraty, Telford Nab., MD as Consulting Physician (Gastroenterology)  Inpatient Treatment Team: Treatment Team: Attending Provider: Michael Boston, MD; Registered Nurse: Arminda Resides, RN; Utilization Review: Darryll Capers, RN   Problem List:   Principal Problem:   Cancer of ascending colon s/p robotic proximal colectomy 10/11/2019 Active Problems:   Coronary artery disease involving native coronary artery of native heart without angina pectoris   Atherosclerosis of native coronary artery of native heart without angina pectoris   Essential hypertension   Gastroesophageal reflux disease   Hypothyroidism   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  Meclizine has helped in the past.  [facility] scopolamine patch -Urine output okay and creatinine stable with history of chronic kidney disease. Hypertension control Control of GERD. -VTE prophylaxis- SCDs, etc -mobilize as tolerated to help recovery  25 minutes spent in review, evaluation, examination, counseling, and coordination of care.  More than 50% of that time was spent in  counseling.  10/12/2019    Subjective: (Chief complaint)  Disease so stayed in bed.  Tolerating liquids.  Passing gas and had bowel movement already  Objective:  Vital signs:  Vitals:   10/11/19 2058 10/12/19 0201 10/12/19 0549 10/12/19 0553  BP: 136/61 104/69 (!) 119/52   Pulse: 71 69 (!) 55   Resp: 16 18 20    Temp: (!) 97.4 F (36.3 C) (!) 97.5 F (36.4 C) 97.6 F (36.4 C)   TempSrc: Oral Oral Oral   SpO2: 100% 98% 98%   Weight:    69.9 kg  Height:        Last BM Date: 10/10/19  Intake/Output   Yesterday:  11/18 0701 - 11/19 0700 In: 3967.9 [P.O.:560; I.V.:2957.9; IV Piggyback:450] Out: 950 [Urine:900; Blood:50] This shift:  No intake/output data recorded.  Bowel function:  Flatus: YES  BM:  YES  Drain: (No drain)   Physical Exam:  General: Pt awake/alert/oriented x4 in no acute distress Eyes: PERRL, normal EOM.  Sclera clear.  No icterus Neuro: CN II-XII intact w/o focal sensory/motor deficits. Lymph: No head/neck/groin lymphadenopathy Psych:  No delerium/psychosis/paranoia HENT: Normocephalic, Mucus membranes moist.  No thrush Neck: Supple, No tracheal deviation Chest: No chest wall pain w good excursion CV:  Pulses intact.  Regular rhythm MS: Normal AROM mjr joints.  No obvious deformity  Abdomen: Soft.  Nondistended.  Mildly tender at incisions only.  No evidence of peritonitis.  No incarcerated hernias.  Ext:  No deformity.  No mjr edema.  No cyanosis Skin: No petechiae / purpura  Results:   Cultures: Recent Results (from the past 720 hour(s))  Novel Coronavirus, NAA (Hosp order, Send-out to Ref Lab; TAT 18-24 hrs  Status: None   Collection Time: 10/07/19  5:23 PM   Specimen: Nasopharyngeal Swab; Respiratory  Result Value Ref Range Status   SARS-CoV-2, NAA NOT DETECTED NOT DETECTED Final    Comment: (NOTE) This nucleic acid amplification test was developed and its performance characteristics determined by [employer]. Nucleic acid amplification tests include PCR and TMA. This test has not been FDA cleared or approved. This test has been authorized by FDA under an Emergency Use Authorization (EUA). This test is only authorized for the duration of time the declaration that circumstances exist justifying the authorization of the emergency use of in vitro diagnostic tests for detection of SARS-CoV-2 virus and/or diagnosis of COVID-19 infection under section 564(b)(1) of the Act, 21 U.S.C. 676PPJ-0(D) (1), unless the authorization is terminated or revoked sooner. When diagnostic testing is negative, the possibility of a false negative result should be considered in the context of a patient's recent exposures and the presence of clinical signs and symptoms consistent with COVID-19. An individual without symptoms of COVID- 19 and who is not shedding SARS-CoV-2 vi rus would expect to have a negative (not detected) result in this assay. Performed At: [facility] [address] [place], [place] [id number] Rush Farmer MD KD:[id number]    Katy  Final    Comment: Performed at [facility], [place] [address] [place], [place] [address]    Labs: Results for orders placed or performed during the hospital encounter of 10/11/19 (from the past 48 hour(s))  Type and screen [place]     Status: None   Collection Time: 10/11/19  8:05 AM  Result Value Ref Range   ABO/RH(D) O POS    Antibody Screen NEG    Sample Expiration      10/14/2019,2359 Performed at [facility], [place] [address], [place], [place] [address]   ABO/Rh     Status: None   Collection Time: 10/11/19  8:05 AM  Result Value Ref Range   ABO/RH(D)      O POS Performed at [facility], [place] [address], [place], [place] [address]   Basic metabolic panel     Status: Abnormal   Collection Time: 10/12/19  4:12 AM  Result Value Ref Range    Sodium 135 135 - 145 mmol/L   Potassium 4.4 3.5 - 5.1 mmol/L   Chloride 103 98 - 111 mmol/L   CO2 23 22 - 32 mmol/L   Glucose, Bld 135 (H) 70 - 99 mg/dL   BUN 16 8 - 23 mg/dL   Creatinine, Ser 1.38 (H) 0.44 - 1.00 mg/dL   Calcium 8.8 (L) 8.9 - 10.3 mg/dL   GFR calc non Af Amer 36 (L) >60 mL/min   GFR calc Af Amer 42 (L) >60 mL/min   Anion gap 9 5 - 15    Comment: Performed at [facility] [address], [place], [place] [address]  CBC     Status: Abnormal   Collection Time: 10/12/19  4:12 AM  Result Value Ref Range   WBC 12.6 (H) 4.0 - 10.5 K/uL   RBC 3.48 (L) 3.87 - 5.11 MIL/uL   Hemoglobin 8.9 (L) 12.0 - 15.0 g/dL    Comment: REPEATED TO VERIFY DELTA CHECK NOTED    HCT 30.3 (L) 36.0 - 46.0 %   MCV 87.1 80.0 - 100.0 fL   MCH 25.6 (L) 26.0 - 34.0 pg   MCHC 29.4 (L) 30.0 - 36.0 g/dL  RDW 17.6 (H) 11.5 - 15.5 %   Platelets 229 150 - 400 K/uL   nRBC 0.0 0.0 - 0.2 %    Comment: Performed at [facility], [place] [address], [place], [place] [address]  Magnesium     Status: None   Collection Time: 10/12/19  4:12 AM  Result Value Ref Range   Magnesium 1.8 1.7 - 2.4 mg/dL    Comment: Performed at [facility], [place] [address], [place], [place] [address]    Imaging / Studies: No results found.  Medications / Allergies: per chart  Antibiotics: Anti-infectives (From admission, onward)   Start     Dose/Rate Route Frequency Ordered Stop   10/11/19 2100  cefoTEtan (CEFOTAN) 2 g in sodium chloride 0.9 % 100 mL IVPB     2 g 200 mL/hr over 30 Minutes Intravenous Every 12 hours 10/11/19 1353 10/11/19 2047   10/11/19 1400  neomycin (MYCIFRADIN) tablet 1,000 mg  Status:  Discontinued     1,000 mg Oral 3 times per day 10/11/19 0718 10/11/19 1321   10/11/19 1400  metroNIDAZOLE (FLAGYL) tablet 1,000 mg  Status:  Discontinued     1,000 mg Oral 3 times per day 10/11/19 0718 10/11/19 1321   10/11/19 1101  clindamycin (CLEOCIN) 900  mg, gentamicin (GARAMYCIN) 240 mg in sodium chloride 0.9 % 1,000 mL for intraperitoneal lavage  Status:  Discontinued       As needed 10/11/19 1101 10/11/19 1321   10/11/19 0730  cefoTEtan (CEFOTAN) 2 g in sodium chloride 0.9 % 100 mL IVPB     2 g 200 mL/hr over 30 Minutes Intravenous On call to O.R. 10/11/19 1448 10/11/19 0915   10/11/19 0600  clindamycin (CLEOCIN) 900 mg, gentamicin (GARAMYCIN) 240 mg in sodium chloride 0.9 % 1,000 mL for intraperitoneal lavage  Status:  Discontinued      Irrigation To Surgery 10/10/19 0733 10/11/19 1321        Note: Portions of this report may have been transcribed using voice recognition software. Every effort was made to ensure accuracy; however, inadvertent computerized transcription errors may be present.   Any transcriptional errors that result from this process are unintentional.     Adin Hector, MD, FACS, MASCRS Gastrointestinal and Minimally Invasive Surgery    [address] [place], [place] [address] [phone] Main / Paging [phone] Fax

## 2019-10-12 NOTE — Progress Notes (Signed)
Patient encouraged to ambulate this am, states that she is still feeling dizzy because of her Vertigo. We will continue to monitor.

## 2019-10-13 ENCOUNTER — Other Ambulatory Visit: Payer: Self-pay

## 2019-10-13 MED ORDER — HYDROCODONE-ACETAMINOPHEN 5-325 MG PO TABS: 1.0000 | ORAL_TABLET | Freq: Four times a day (QID) | ORAL | 0 refills | Status: DC | PRN

## 2019-10-13 NOTE — Progress Notes (Signed)
Discharge instructions discussed with patient and family, verbalized agreement and understanding 

## 2019-10-13 NOTE — Care Management Important Message (Signed)
Important Message  Patient Details IM Letter given to Sharren Bridge SW to present to the Patient Name: Jessica Taylor MRN: [id number] Date of Birth: 11-Mar-1941   Medicare Important Message Given:  Yes     Kerin Salen 10/13/2019, 10:29 AM

## 2019-10-13 NOTE — Discharge Summary (Signed)
Physician Discharge Summary    Patient ID: Jessica Taylor MRN: [id number] DOB/AGE: 12/05/1940  78 y.o.  Patient Care Team: Antony Contras, MD as PCP - General (Family Medicine) Jerline Pain, MD as PCP - Cardiology (Cardiology) Michael Boston, MD as Consulting Physician (General Surgery) Jerline Pain, MD as Consulting Physician (Cardiology) Mansouraty, Telford Nab., MD as Consulting Physician (Gastroenterology)  Admit date: 10/11/2019  Discharge date: 10/13/2019  Hospital Stay = 2 days    Discharge Diagnoses:  Principal Problem:   Cancer of ascending colon s/p robotic proximal colectomy 10/11/2019 Active Problems:   Coronary artery disease involving native coronary artery of native heart without angina pectoris   Atherosclerosis of native coronary artery of native heart without angina pectoris   Essential hypertension   Gastroesophageal reflux disease   Hypothyroidism   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  Postoperatively, the patient gradually mobilized and advanced to a solid diet.  Pain and other symptoms were treated aggressively.    By the time of discharge, the patient was walking well the hallways, eating food, having flatus.  Pain was well-controlled on an oral medications.  Based on meeting discharge criteria and continuing to recover, I felt it was safe for the patient to be discharged from the hospital to further recover with close followup. Postoperative recommendations were discussed in detail.  They are written as well.  Discharged Condition:  good  Discharge Exam: Blood pressure (!) 127/53, pulse (!) 50, temperature 97.8 F (36.6 C), temperature source Oral, resp. rate 20, height 5\' 3"  (1.6 m), weight 67.3 kg, SpO2 96 %.  General: Pt awake/alert/oriented x4 in No acute distress Eyes: PERRL, normal EOM.  Sclera clear.  No icterus Neuro: CN II-XII intact w/o focal sensory/motor deficits. Lymph: No head/neck/groin lymphadenopathy Psych:  No delerium/psychosis/paranoia HENT: Normocephalic, Mucus membranes moist.  No thrush Neck: Supple, No tracheal deviation Chest: No chest wall pain w good excursion CV:  Pulses intact.  Regular rhythm MS: Normal AROM mjr joints.  No obvious deformity Abdomen: Soft.  Nondistended.  Mildly tender at incisions only.  Dressings c/d/i.  No evidence of peritonitis.  No incarcerated hernias. Ext:  SCDs BLE.  No mjr edema.  No cyanosis Skin: No petechiae / purpura   Disposition:   Follow-up Information    Schedule an appointment as soon as possible for a visit with Michael Boston, MD.   Specialty: General Surgery Why: To follow up after your operation, To follow up after your hospital stay Contact information: [place] [id number] [phone]           Discharge disposition: 01-Home or Self Care       Discharge Instructions    Call MD for:   Complete by: As directed    FEVER > 101.5 F  (temperatures < 101.5 F are not significant)   Call MD for:   Complete by: As directed    FEVER > 101.5 F  (temperatures < 101.5 F are not significant)   Call MD for:  extreme fatigue  Complete by: As directed    Call MD for:  extreme fatigue   Complete by: As directed    Call MD for:  persistant dizziness or light-headedness   Complete by: As directed    Call MD for:  persistant dizziness or light-headedness   Complete by: As directed    Call MD for:  persistant nausea and vomiting   Complete by: As directed    Call MD for:  persistant nausea and vomiting    Complete by: As directed    Call MD for:  redness, tenderness, or signs of infection (pain, swelling, redness, odor or green/yellow discharge around incision site)   Complete by: As directed    Call MD for:  redness, tenderness, or signs of infection (pain, swelling, redness, odor or green/yellow discharge around incision site)   Complete by: As directed    Call MD for:  severe uncontrolled pain   Complete by: As directed    Call MD for:  severe uncontrolled pain   Complete by: As directed    Diet - low sodium heart healthy   Complete by: As directed    Start with a bland diet such as soups, liquids, starchy foods, low fat foods, etc. the first few days at home. Gradually advance to a solid, low-fat, high fiber diet by the end of the first week at home.   Add a fiber supplement to your diet (Metamucil, etc) If you feel full, bloated, or constipated, stay on a full liquid or pureed/blenderized diet for a few days until you feel better and are no longer constipated.   Discharge instructions   Complete by: As directed    See Discharge Instructions If you are not getting better after two weeks or are noticing you are getting worse, contact our office [phone] for further advice.  We may need to adjust your medications, re-evaluate you in the office, send you to the emergency room, or see what other things we can do to help. The clinic staff is available to answer your questions during regular business hours (8:30am-5pm).  Please don't hesitate to call and ask to speak to one of our nurses for clinical concerns.    A surgeon from [facility] is always on call at the hospitals 24 hours/day If you have a medical emergency, go to the nearest emergency room or call 911.   Discharge instructions   Complete by: As directed    See Discharge Instructions If you are not getting better after two weeks or are noticing you are getting worse, contact our office [phone] for further  advice.  We may need to adjust your medications, re-evaluate you in the office, send you to the emergency room, or see what other things we can do to help. The clinic staff is available to answer your questions during regular business hours (8:30am-5pm).  Please don't hesitate to call and ask to speak to one of our nurses for clinical concerns.    A surgeon from [facility] is always on call at the hospitals 24 hours/day If you have a medical emergency, go to the nearest emergency room or call 911.   Discharge wound care:   Complete by: As directed    It is good for closed incisions and even open wounds to be washed every day.  Shower every day.  Short baths are fine.  Wash the incisions and wounds clean with soap & water.    You may leave closed incisions  open to air if it is dry.   You may cover the incision with clean gauze & replace it after your daily shower for comfort.   Discharge wound care:   Complete by: As directed    It is good for closed incisions and even open wounds to be washed every day.  Shower every day.  Short baths are fine.  Wash the incisions and wounds clean with soap & water.    You may leave closed incisions open to air if it is dry.   You may cover the incision with clean gauze & replace it after your daily shower for comfort.   Driving Restrictions   Complete by: As directed    You may drive when: - you are no longer taking narcotic prescription pain medication - you can comfortably wear a seatbelt - you can safely make sudden turns/stops without pain.   Driving Restrictions   Complete by: As directed    You may drive when: - you are no longer taking narcotic prescription pain medication - you can comfortably wear a seatbelt - you can safely make sudden turns/stops without pain.   Increase activity slowly   Complete by: As directed    Start light daily activities --- self-care, walking, climbing stairs- beginning the day after surgery.  Gradually  increase activities as tolerated.  Control your pain to be active.  Stop when you are tired.  Ideally, walk several times a day, eventually an hour a day.   Most people are back to most day-to-day activities in a few weeks.  It takes 4-6 weeks to get back to unrestricted, intense activity. If you can walk 30 minutes without difficulty, it is safe to try more intense activity such as jogging, treadmill, bicycling, low-impact aerobics, swimming, etc. Save the most intensive and strenuous activity for last (Usually 4-8 weeks after surgery) such as sit-ups, heavy lifting, contact sports, etc.  Refrain from any intense heavy lifting or straining until you are off narcotics for pain control.  You will have off days, but things should improve week-by-week. DO NOT PUSH THROUGH PAIN.  Let pain be your guide: If it hurts to do something, don't do it.   Increase activity slowly   Complete by: As directed    Start light daily activities --- self-care, walking, climbing stairs- beginning the day after surgery.  Gradually increase activities as tolerated.  Control your pain to be active.  Stop when you are tired.  Ideally, walk several times a day, eventually an hour a day.   Most people are back to most day-to-day activities in a few weeks.  It takes 4-6 weeks to get back to unrestricted, intense activity. If you can walk 30 minutes without difficulty, it is safe to try more intense activity such as jogging, treadmill, bicycling, low-impact aerobics, swimming, etc. Save the most intensive and strenuous activity for last (Usually 4-8 weeks after surgery) such as sit-ups, heavy lifting, contact sports, etc.  Refrain from any intense heavy lifting or straining until you are off narcotics for pain control.  You will have off days, but things should improve week-by-week. DO NOT PUSH THROUGH PAIN.  Let pain be your guide: If it hurts to do something, don't do it.   Lifting restrictions   Complete by: As directed    If  you can walk 30 minutes without difficulty, it is safe to try more intense activity such as jogging, treadmill, bicycling, low-impact aerobics, swimming, etc. Save the most intensive and  strenuous activity for last (Usually 4-8 weeks after surgery) such as sit-ups, heavy lifting, contact sports, etc.   Refrain from any intense heavy lifting or straining until you are off narcotics for pain control.  You will have off days, but things should improve week-by-week. DO NOT PUSH THROUGH PAIN.  Let pain be your guide: If it hurts to do something, don't do it.  Pain is your body warning you to avoid that activity for another week until the pain goes down.   Lifting restrictions   Complete by: As directed    If you can walk 30 minutes without difficulty, it is safe to try more intense activity such as jogging, treadmill, bicycling, low-impact aerobics, swimming, etc. Save the most intensive and strenuous activity for last (Usually 4-8 weeks after surgery) such as sit-ups, heavy lifting, contact sports, etc.   Refrain from any intense heavy lifting or straining until you are off narcotics for pain control.  You will have off days, but things should improve week-by-week. DO NOT PUSH THROUGH PAIN.  Let pain be your guide: If it hurts to do something, don't do it.  Pain is your body warning you to avoid that activity for another week until the pain goes down.   May shower / Bathe   Complete by: As directed    May shower / Bathe   Complete by: As directed    May walk up steps   Complete by: As directed    May walk up steps   Complete by: As directed    Remove dressing in 72 hours   Complete by: As directed    Make sure all dressings are removed by the third day after surgery.  Leave incisions open to air.  OK to cover incisions with gauze or bandages as desired   Remove dressing in 72 hours   Complete by: As directed    Make sure all dressings are removed by Monday 23 November.  Leave incisions open to  air.  OK to cover incisions with gauze or bandages as desired   Sexual Activity Restrictions   Complete by: As directed    You may have sexual intercourse when it is comfortable. If it hurts to do something, stop.   Sexual Activity Restrictions   Complete by: As directed    You may have sexual intercourse when it is comfortable. If it hurts to do something, stop.      Allergies as of 10/13/2019      Reactions   Shrimp [shellfish Allergy] Nausea And Vomiting   Throws up violently      Medication List    TAKE these medications   aspirin EC 81 MG tablet Take 81 mg by mouth every evening.   atorvastatin 40 MG tablet Commonly known as: LIPITOR TAKE (1) TABLET BY MOUTH ONCE DAILY. What changed: See the new instructions.   clopidogrel 75 MG tablet Commonly known as: PLAVIX TAKE (1) TABLET BY MOUTH ONCE DAILY. What changed: See the new instructions.   esomeprazole 20 MG capsule Commonly known as: NEXIUM Take 20 mg by mouth daily.   HYDROcodone-acetaminophen 5-325 MG tablet Commonly known as: NORCO/VICODIN Take 1 tablet by mouth every 6 (six) hours as needed for moderate pain or severe pain. What changed: reasons to take this   levothyroxine 50 MCG tablet Commonly known as: SYNTHROID Take 50 mcg by mouth daily before breakfast.   metoprolol tartrate 25 MG tablet Commonly known as: LOPRESSOR Take 1 tablet (25 mg total) by  mouth 2 (two) times daily.   nitroGLYCERIN 0.4 MG SL tablet Commonly known as: NITROSTAT Place 1 tablet (0.4 mg total) under the tongue every 5 (five) minutes as needed for chest pain.            Discharge Care Instructions  (From admission, onward)         Start     Ordered   10/13/19 0000  Discharge wound care:    Comments: It is good for closed incisions and even open wounds to be washed every day.  Shower every day.  Short baths are fine.  Wash the incisions and wounds clean with soap & water.    You may leave closed incisions open to air  if it is dry.   You may cover the incision with clean gauze & replace it after your daily shower for comfort.   10/13/19 0730   10/11/19 0000  Discharge wound care:    Comments: It is good for closed incisions and even open wounds to be washed every day.  Shower every day.  Short baths are fine.  Wash the incisions and wounds clean with soap & water.    You may leave closed incisions open to air if it is dry.   You may cover the incision with clean gauze & replace it after your daily shower for comfort.   10/11/19 0806          Significant Diagnostic Studies:  Results for orders placed or performed during the hospital encounter of 10/11/19 (from the past 72 hour(s))  Type and screen [place]     Status: None   Collection Time: 10/11/19  8:05 AM  Result Value Ref Range   ABO/RH(D) O POS    Antibody Screen NEG    Sample Expiration      10/14/2019,2359 Performed at [facility], [place] [address], [place], [place] [address]   ABO/Rh     Status: None   Collection Time: 10/11/19  8:05 AM  Result Value Ref Range   ABO/RH(D)      O POS Performed at [facility], [place] [address] [place], [place] [address]   Basic metabolic panel     Status: Abnormal   Collection Time: 10/12/19  4:12 AM  Result Value Ref Range   Sodium 135 135 - 145 mmol/L   Potassium 4.4 3.5 - 5.1 mmol/L   Chloride 103 98 - 111 mmol/L   CO2 23 22 - 32 mmol/L   Glucose, Bld 135 (H) 70 - 99 mg/dL   BUN 16 8 - 23 mg/dL   Creatinine, Ser 1.38 (H) 0.44 - 1.00 mg/dL   Calcium 8.8 (L) 8.9 - 10.3 mg/dL   GFR calc non Af Amer 36 (L) >60 mL/min   GFR calc Af Amer 42 (L) >60 mL/min   Anion gap 9 5 - 15    Comment: Performed at [place] [address], [place], [place] [address]  CBC     Status: Abnormal   Collection Time: 10/12/19  4:12 AM  Result Value Ref Range   WBC 12.6 (H) 4.0 - 10.5 K/uL   RBC 3.48 (L) 3.87 - 5.11 MIL/uL   Hemoglobin  8.9 (L) 12.0 - 15.0 g/dL    Comment: REPEATED TO VERIFY DELTA CHECK NOTED    HCT 30.3 (L) 36.0 - 46.0 %   MCV 87.1 80.0 - 100.0 fL   MCH 25.6 (L) 26.0 - 34.0 pg  MCHC 29.4 (L) 30.0 - 36.0 g/dL   RDW 17.6 (H) 11.5 - 15.5 %   Platelets 229 150 - 400 K/uL   nRBC 0.0 0.0 - 0.2 %    Comment: Performed at [facility], [place] [address], [place], [place] [address]  Magnesium     Status: None   Collection Time: 10/12/19  4:12 AM  Result Value Ref Range   Magnesium 1.8 1.7 - 2.4 mg/dL    Comment: Performed at [facility], [place] [address] [place], [place] [address]    No results found.  Past Medical History:  Diagnosis Date  . Abdominal aortic aneurysm ([place])   . Acid reflux   . Anemia   . Arthritis    knees , R shoulder - tx /w injection - 11/2014  . Basal cell carcinoma (BCC) of dorsum of nose 2010   Resolved  . Cancer ([place])    basal cell on nose  . Coronary artery disease   . Hypercholesteremia   . Hypertension   . Hypothyroidism   . Lt Acute pyelonephritis 09/11/2018  . MI, old 67  . Peripheral arterial disease ([facility])    hhigh-grade ostial bilateral calcified iliac stenosis with claudication  . Tobacco abuse   . Vertigo    when lays on left side.    Past Surgical History:  Procedure Laterality Date  . CARDIAC CATHETERIZATION  5 stents  . CARDIAC CATHETERIZATION N/A 01/20/2016   Procedure: Left Heart Cath and Coronary Angiography;  Surgeon: Peter M Martinique, MD;  Location: [facility];  Service: Cardiovascular;  Laterality: N/A;  . CATARACT EXTRACTION W/PHACO Left 05/24/2014   Procedure: CATARACT EXTRACTION PHACO AND INTRAOCULAR LENS PLACEMENT (IOC);  Surgeon: Tonny Branch, MD;  Location: [facility];  Service: Ophthalmology;  Laterality: Left;  CDE:  9.30  . CATARACT EXTRACTION W/PHACO Right 06/18/2014   Procedure: CATARACT EXTRACTION PHACO AND INTRAOCULAR LENS PLACEMENT RIGHT EYE CDE=10.84;  Surgeon: Tonny Branch, MD;  Location: [facility];   Service: Ophthalmology;  Laterality: Right;  . CORONARY ARTERY BYPASS GRAFT N/A 01/24/2016   Procedure: CORONARY ARTERY BYPASS GRAFTING (CABG);  Surgeon: Gaye Pollack, MD;  Location: [facility];  Service: Open Heart Surgery;  Laterality: N/A;  . CORONARY STENT PLACEMENT    . CORONARY STENT PLACEMENT  03/06/15   CFX DES  . CYSTOSCOPY/URETEROSCOPY/HOLMIUM LASER/STENT PLACEMENT Left 09/12/2018   Procedure: CYSTOSCOPY/URETEROSCOPY/STENT PLACEMENT;  Surgeon: Ceasar Mons, MD;  Location: [facility];  Service: Urology;  Laterality: Left;  . EYE SURGERY    . LEFT HEART CATH AND CORS/GRAFTS ANGIOGRAPHY N/A 09/12/2019   Procedure: LEFT HEART CATH AND CORS/GRAFTS ANGIOGRAPHY;  Surgeon: Troy Sine, MD;  Location: [facility];  Service: Cardiovascular;  Laterality: N/A;  . LEFT HEART CATHETERIZATION WITH CORONARY ANGIOGRAM N/A 03/06/2015   Procedure: LEFT HEART CATHETERIZATION WITH CORONARY ANGIOGRAM;  Surgeon: Lorretta Harp, MD;  Location: [facility];  Service: Cardiovascular;  Laterality: N/A;  . PARTIAL KNEE ARTHROPLASTY Right 02/04/2015   Procedure: UNICOMPARTMENTAL KNEE;  Surgeon: Dorna Leitz, MD;  Location: [facility];  Service: Orthopedics;  Laterality: Right;  . PERIPHERAL VASCULAR CATHETERIZATION Bilateral 05/13/2015   Procedure: Lower Extremity Angiography;  Surgeon: Lorretta Harp, MD;  Location: [place];  Service: Cardiovascular;  Laterality: Bilateral;  . PERIPHERAL VASCULAR CATHETERIZATION N/A 05/13/2015   Procedure: Abdominal Aortogram;  Surgeon: Lorretta Harp, MD;  Location: [facility];  Service: Cardiovascular;  Laterality: N/A;  . PERIPHERAL  VASCULAR CATHETERIZATION Bilateral 06/27/2015   Procedure: Peripheral Vascular Intervention;  Surgeon: Lorretta Harp, MD;  Location: [place] [facility] [facility];  Service: Cardiovascular;  Laterality: Bilateral;  ILIACS  . PERIPHERAL VASCULAR CATHETERIZATION Bilateral 06/27/2015   Procedure: Peripheral Vascular Atherectomy;   Surgeon: Lorretta Harp, MD;  Location: [place] [facility];  Service: Cardiovascular;  Laterality: Bilateral;  . TEE WITHOUT CARDIOVERSION N/A 01/24/2016   Procedure: TRANSESOPHAGEAL ECHOCARDIOGRAM (TEE);  Surgeon: Gaye Pollack, MD;  Location: [facility];  Service: Open Heart Surgery;  Laterality: N/A;  . TONSILLECTOMY     age 89  . TUBAL LIGATION      Social History   Socioeconomic History  . Marital status: Married    Spouse name: Not on file  . Number of children: 4  . Years of education: Not on file  . Highest education level: Not on file  Occupational History  . Occupation: retired    Comment: worked as Presenter, broadcasting for [employer]  Social Needs  . Financial resource strain: Not hard at all  . Food insecurity    Worry: Never true    Inability: Never true  . Transportation needs    Medical: No    Non-medical: No  Tobacco Use  . Smoking status: Former Smoker    Packs/day: 1.50    Years: 42.00    Pack years: 63.00    Types: Cigarettes    Quit date: 05/19/1999    Years since quitting: 20.4  . Smokeless tobacco: Never Used  Substance and Sexual Activity  . Alcohol use: No  . Drug use: No  . Sexual activity: Yes    Birth control/protection: Surgical  Lifestyle  . Physical activity    Days per week: Patient refused    Minutes per session: Patient refused  . Stress: Only a little  Relationships  . Social connections    Talks on phone: More than three times a week    Gets together: More than three times a week    Attends religious service: More than 4 times per year    Active member of club or organization: No    Attends meetings of clubs or organizations: Never    Relationship status: Married  . Intimate partner violence    Fear of current or ex partner: No    Emotionally abused: No    Physically abused: No    Forced sexual activity: No  Other Topics Concern  . Not on file  Social History Narrative  . Not on file    Family History  Problem Relation  Age of Onset  . Ovarian cancer Mother 107  . Cancer Father 77       unsure of which kind, "it was in his glands"  . Hypertension Maternal Grandmother   . Stroke Maternal Grandfather   . Hypertension Son   . Heart attack Neg Hx   . Colon cancer Neg Hx   . Esophageal cancer Neg Hx   . Inflammatory bowel disease Neg Hx   . Liver disease Neg Hx   . Pancreatic cancer Neg Hx   . Rectal cancer Neg Hx   . Stomach cancer Neg Hx     Current Facility-Administered Medications  Medication Dose Route Frequency Provider Last Rate Last Dose  . 0.9 %  sodium chloride infusion   Intravenous Q8H PRN Michael Boston, MD      . acetaminophen (TYLENOL) tablet 500 mg  500 mg Oral Lajuana Ripple, MD   500  mg at 10/13/19 0616  . alum & mag hydroxide-simeth (MAALOX/MYLANTA) 200-200-20 MG/5ML suspension 30 mL  30 mL Oral Q6H PRN Michael Boston, MD      . aspirin EC tablet 81 mg  81 mg Oral QPM Michael Boston, MD   81 mg at 10/12/19 1808  . atorvastatin (LIPITOR) tablet 40 mg  40 mg Oral q1800 Michael Boston, MD   40 mg at 10/12/19 1808  . diphenhydrAMINE (BENADRYL) 12.5 MG/5ML elixir 12.5 mg  12.5 mg Oral Q6H PRN Michael Boston, MD       Or  . diphenhydrAMINE (BENADRYL) injection 12.5 mg  12.5 mg Intravenous Q6H PRN Michael Boston, MD   12.5 mg at 10/11/19 1416  . enoxaparin (LOVENOX) injection 40 mg  40 mg Subcutaneous Q24H Michael Boston, MD   40 mg at 10/12/19 0914  . feeding supplement (ENSURE SURGERY) liquid 237 mL  237 mL Oral BID BM Michael Boston, MD   237 mL at 10/11/19 1537  . gabapentin (NEURONTIN) capsule 200 mg  200 mg Oral TID Michael Boston, MD   200 mg at 10/12/19 2206  . HYDROcodone-acetaminophen (NORCO) 10-325 MG per tablet 0.5-1 tablet  0.5-1 tablet Oral Q4H PRN Michael Boston, MD      . HYDROcodone-acetaminophen (NORCO/VICODIN) 5-325 MG per tablet 1 tablet  1 tablet Oral Q6H PRN Michael Boston, MD   1 tablet at 10/12/19 1813  . HYDROmorphone (DILAUDID) injection 0.5-2 mg  0.5-2 mg Intravenous Q4H PRN  Michael Boston, MD      . influenza vaccine adjuvanted (FLUAD) injection 0.5 mL  0.5 mL Intramuscular Tomorrow-1000 Michael Boston, MD      . levothyroxine (SYNTHROID) tablet 50 mcg  50 mcg Oral QAC breakfast Michael Boston, MD   50 mcg at 10/13/19 [phone]  . meclizine (ANTIVERT) tablet 12.5 mg  12.5 mg Oral BID Johnathan Hausen, MD   12.5 mg at 10/12/19 2205  . meclizine (ANTIVERT) tablet 12.5 mg  12.5 mg Oral TID PRN Michael Boston, MD      . metoprolol tartrate (LOPRESSOR) injection 5 mg  5 mg Intravenous Q6H PRN Michael Boston, MD      . metoprolol tartrate (LOPRESSOR) tablet 25 mg  25 mg Oral BID Michael Boston, MD   Stopped at 10/12/19 2207  . nitroGLYCERIN (NITROSTAT) SL tablet 0.4 mg  0.4 mg Sublingual Q5 min PRN Michael Boston, MD      . ondansetron [facility]) tablet 4 mg  4 mg Oral Q6H PRN Michael Boston, MD       Or  . ondansetron [facility]) injection 4 mg  4 mg Intravenous Q6H PRN Michael Boston, MD   4 mg at 10/11/19 1334  . prochlorperazine (COMPAZINE) tablet 10 mg  10 mg Oral Q6H PRN Michael Boston, MD       Or  . prochlorperazine (COMPAZINE) injection 5-10 mg  5-10 mg Intravenous Q6H PRN Michael Boston, MD      . saccharomyces boulardii (FLORASTOR) capsule 250 mg  250 mg Oral BID Michael Boston, MD   250 mg at 10/12/19 2205  . scopolamine (TRANSDERM-SCOP) 1 MG/3DAYS 1.5 mg  1 patch Transdermal Cyndia Bent, MD   1.5 mg at 10/12/19 0915     Allergies  Allergen Reactions  . Shrimp [Shellfish Allergy] Nausea And Vomiting    Throws up violently    Signed: Morton Peters, MD, FACS, MASCRS Gastrointestinal and Minimally Invasive Surgery  [facility] [address], [place] [address] [place], [place] [address] [phone]  [phone] Main / Paging [phone] Fax     10/13/2019, 7:30 AM

## 2019-10-20 LAB — SURGICAL PATHOLOGY

## 2019-10-23 ENCOUNTER — Encounter (HOSPITAL_COMMUNITY): Payer: Self-pay | Admitting: *Deleted

## 2019-10-23 ENCOUNTER — Other Ambulatory Visit: Payer: Self-pay | Admitting: Nurse Practitioner

## 2019-10-23 ENCOUNTER — Encounter (HOSPITAL_COMMUNITY): Payer: Self-pay | Admitting: Hematology

## 2019-10-23 ENCOUNTER — Inpatient Hospital Stay (HOSPITAL_COMMUNITY): Payer: Medicare Other

## 2019-10-23 ENCOUNTER — Inpatient Hospital Stay (HOSPITAL_COMMUNITY): Payer: Medicare Other | Attending: Hematology | Admitting: Hematology

## 2019-10-23 ENCOUNTER — Other Ambulatory Visit: Payer: Self-pay

## 2019-10-23 VITALS — BP 126/58 | HR 71 | Temp 96.9°F | Resp 18 | Ht 63.0 in | Wt 147.8 lb

## 2019-10-23 DIAGNOSIS — Z7902 Long term (current) use of antithrombotics/antiplatelets: Secondary | ICD-10-CM | POA: Diagnosis not present

## 2019-10-23 DIAGNOSIS — I10 Essential (primary) hypertension: Secondary | ICD-10-CM | POA: Insufficient documentation

## 2019-10-23 DIAGNOSIS — E7800 Pure hypercholesterolemia, unspecified: Secondary | ICD-10-CM | POA: Insufficient documentation

## 2019-10-23 DIAGNOSIS — K219 Gastro-esophageal reflux disease without esophagitis: Secondary | ICD-10-CM | POA: Insufficient documentation

## 2019-10-23 DIAGNOSIS — Z7982 Long term (current) use of aspirin: Secondary | ICD-10-CM | POA: Insufficient documentation

## 2019-10-23 DIAGNOSIS — D649 Anemia, unspecified: Secondary | ICD-10-CM | POA: Diagnosis not present

## 2019-10-23 DIAGNOSIS — M1990 Unspecified osteoarthritis, unspecified site: Secondary | ICD-10-CM | POA: Diagnosis not present

## 2019-10-23 DIAGNOSIS — Z79899 Other long term (current) drug therapy: Secondary | ICD-10-CM | POA: Insufficient documentation

## 2019-10-23 DIAGNOSIS — I2510 Atherosclerotic heart disease of native coronary artery without angina pectoris: Secondary | ICD-10-CM | POA: Insufficient documentation

## 2019-10-23 DIAGNOSIS — Z9049 Acquired absence of other specified parts of digestive tract: Secondary | ICD-10-CM | POA: Insufficient documentation

## 2019-10-23 DIAGNOSIS — C182 Malignant neoplasm of ascending colon: Secondary | ICD-10-CM

## 2019-10-23 DIAGNOSIS — Z87891 Personal history of nicotine dependence: Secondary | ICD-10-CM | POA: Diagnosis not present

## 2019-10-23 LAB — IRON AND TIBC
Iron: 29 ug/dL (ref 28–170)
Saturation Ratios: 11 % (ref 10.4–31.8)
TIBC: 271 ug/dL (ref 250–450)
UIBC: 242 ug/dL

## 2019-10-23 LAB — FOLATE: Folate: 8.1 ng/mL (ref >5.9)

## 2019-10-23 LAB — VITAMIN B12: Vitamin B-12: 350 pg/mL (ref 180–914)

## 2019-10-23 LAB — FERRITIN: Ferritin: 47 ng/mL (ref 11–307)

## 2019-10-23 NOTE — Progress Notes (Signed)
[facility] NOTE  Patient Care Team: Antony Contras, MD as PCP - General (Family Medicine) Jerline Pain, MD as PCP - Cardiology (Cardiology) Michael Boston, MD as Consulting Physician (General Surgery) Jerline Pain, MD as Consulting Physician (Cardiology) Mansouraty, Telford Nab., MD as Consulting Physician (Gastroenterology)  CHIEF COMPLAINTS/PURPOSE OF CONSULTATION:  Adenocarcinoma of the ascending colon.  HISTORY OF PRESENTING ILLNESS:  Jessica Taylor 78 y.o. female is seen in consultation today at the request of Dr. Johney Maine for newly diagnosed adenocarcinoma of the ascending colon.  She had a recent onset anemia requiring blood transfusions and iron infusions.  Colonoscopy on 08/03/2019 by Dr. Rush Landmark showed right colon mass, biopsy consistent with adenocarcinoma.  Subsequent CT CAP on 08/08/2019 showed approximately 4.8 cm circumferential mass in the ascending colon with local tumor extension medially along the colon mesentery with paracolic tumor measuring up to 3.5 cm, adjacent small paracolic lymph nodes adjacent to the tumor.  Tiny hypodense lesion in the right hepatic lobe, technically too small to characterize.  Segment 4 small liver lesion appears to be a stable cyst.  Preoperative CEA level on 08/09/2019 was 39.5.  She underwent right hemicolectomy on 10/11/2019, grade 3 adenocarcinoma, negative margins, positive LVSI, 2 tumor deposits, 0/15 lymph nodes positive, PT3PN1C.  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  She is currently recovering well from surgery.  She reported 20 pound weight loss in the last 3 months.  Her last blood transfusion was reportedly in August 2020.  Denies any tingling or numbness extremities.  Lives at home with her husband.  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  She smoked 2 pack/day for 40 years and quit in the early 2000's.  She did not drink alcohol.  Family history significant for mother with ovarian  cancer.  Father had parotid gland cancer metastasized to the brain.  Sister died of brain cancer.  Appetite and energy levels have improved to 50%.  MEDICAL HISTORY:  Past Medical History:  Diagnosis Date  . Abdominal aortic aneurysm ([place])   . Acid reflux   . Anemia   . Arthritis    knees , R shoulder - tx /w injection - 11/2014  . Basal cell carcinoma (BCC) of dorsum of nose 2010   Resolved  . Cancer ([place])    basal cell on nose  . Colon cancer ([place])   . Coronary artery disease   . Hypercholesteremia   . Hypertension   . Hypothyroidism   . Lt Acute pyelonephritis 09/11/2018  . MI, old 33  . Peripheral arterial disease ([facility])    hhigh-grade ostial bilateral calcified iliac stenosis with claudication  . Tobacco abuse   . Vertigo    when lays on left side.    SURGICAL HISTORY: Past Surgical History:  Procedure Laterality Date  . CARDIAC CATHETERIZATION  5 stents  . CARDIAC CATHETERIZATION N/A 01/20/2016   Procedure: Left Heart Cath and Coronary Angiography;  Surgeon: Peter M Martinique, MD;  Location: [facility];  Service: Cardiovascular;  Laterality: N/A;  . CATARACT EXTRACTION W/PHACO Left 05/24/2014   Procedure: CATARACT EXTRACTION PHACO AND INTRAOCULAR LENS PLACEMENT (IOC);  Surgeon: Tonny Branch, MD;  Location: [facility];  Service: Ophthalmology;  Laterality: Left;  CDE:  9.30  . CATARACT EXTRACTION W/PHACO Right 06/18/2014   Procedure: CATARACT EXTRACTION PHACO AND INTRAOCULAR LENS PLACEMENT RIGHT EYE CDE=10.84;  Surgeon: Tonny Branch, MD;  Location: [facility];  Service: Ophthalmology;  Laterality: Right;  .  CORONARY ARTERY BYPASS GRAFT N/A 01/24/2016   Procedure: CORONARY ARTERY BYPASS GRAFTING (CABG);  Surgeon: Gaye Pollack, MD;  Location: [facility];  Service: Open Heart Surgery;  Laterality: N/A;  . CORONARY STENT PLACEMENT    . CORONARY STENT PLACEMENT  03/06/15   CFX DES  . CYSTOSCOPY/URETEROSCOPY/HOLMIUM LASER/STENT PLACEMENT Left 09/12/2018   Procedure:  CYSTOSCOPY/URETEROSCOPY/STENT PLACEMENT;  Surgeon: Ceasar Mons, MD;  Location: [facility];  Service: Urology;  Laterality: Left;  . EYE SURGERY    . LEFT HEART CATH AND CORS/GRAFTS ANGIOGRAPHY N/A 09/12/2019   Procedure: LEFT HEART CATH AND CORS/GRAFTS ANGIOGRAPHY;  Surgeon: Troy Sine, MD;  Location: [facility];  Service: Cardiovascular;  Laterality: N/A;  . LEFT HEART CATHETERIZATION WITH CORONARY ANGIOGRAM N/A 03/06/2015   Procedure: LEFT HEART CATHETERIZATION WITH CORONARY ANGIOGRAM;  Surgeon: Lorretta Harp, MD;  Location: [facility];  Service: Cardiovascular;  Laterality: N/A;  . PARTIAL KNEE ARTHROPLASTY Right 02/04/2015   Procedure: UNICOMPARTMENTAL KNEE;  Surgeon: Dorna Leitz, MD;  Location: [facility];  Service: Orthopedics;  Laterality: Right;  . PERIPHERAL VASCULAR CATHETERIZATION Bilateral 05/13/2015   Procedure: Lower Extremity Angiography;  Surgeon: Lorretta Harp, MD;  Location: [facility];  Service: Cardiovascular;  Laterality: Bilateral;  . PERIPHERAL VASCULAR CATHETERIZATION N/A 05/13/2015   Procedure: Abdominal Aortogram;  Surgeon: Lorretta Harp, MD;  Location: [facility];  Service: Cardiovascular;  Laterality: N/A;  . PERIPHERAL VASCULAR CATHETERIZATION Bilateral 06/27/2015   Procedure: Peripheral Vascular Intervention;  Surgeon: Lorretta Harp, MD;  Location: [facility];  Service: Cardiovascular;  Laterality: Bilateral;  ILIACS  . PERIPHERAL VASCULAR CATHETERIZATION Bilateral 06/27/2015   Procedure: Peripheral Vascular Atherectomy;  Surgeon: Lorretta Harp, MD;  Location: [facility];  Service: Cardiovascular;  Laterality: Bilateral;  . TEE WITHOUT CARDIOVERSION N/A 01/24/2016   Procedure: TRANSESOPHAGEAL ECHOCARDIOGRAM (TEE);  Surgeon: Gaye Pollack, MD;  Location: [facility];  Service: Open Heart Surgery;  Laterality: N/A;  . TONSILLECTOMY     age 77  . TUBAL LIGATION      SOCIAL HISTORY: Social History   Socioeconomic  History  . Marital status: Married    Spouse name: [name]  . Number of children: 4  . Years of education: Not on file  . Highest education level: Not on file  Occupational History  . Occupation: retired    Comment: worked as Presenter, broadcasting for [employer]  Social Needs  . Financial resource strain: Not hard at all  . Food insecurity    Worry: Never true    Inability: Never true  . Transportation needs    Medical: No    Non-medical: No  Tobacco Use  . Smoking status: Former Smoker    Packs/day: 1.50    Years: 42.00    Pack years: 63.00    Types: Cigarettes    Quit date: 05/19/1999    Years since quitting: 20.4  . Smokeless tobacco: Never Used  Substance and Sexual Activity  . Alcohol use: No  . Drug use: No  . Sexual activity: Yes    Birth control/protection: Surgical  Lifestyle  . Physical activity    Days per week: Patient refused    Minutes per session: Patient refused  . Stress: Only a little  Relationships  . Social connections    Talks on phone: More than three times a week    Gets together: More than three times a week    Attends religious service: More  than 4 times per year    Active member of club or organization: No    Attends meetings of clubs or organizations: Never    Relationship status: Married  . Intimate partner violence    Fear of current or ex partner: No    Emotionally abused: No    Physically abused: No    Forced sexual activity: No  Other Topics Concern  . Not on file  Social History Narrative  . Not on file    FAMILY HISTORY: Family History  Problem Relation Age of Onset  . Ovarian cancer Mother 60  . Cancer Father 48       unsure of which kind, "it was in his glands"  . Hypertension Maternal Grandmother   . Stroke Maternal Grandfather   . Hypertension Son   . Brain cancer Sister   . Hypertension Daughter   . Heart attack Neg Hx   . Colon cancer Neg Hx   . Esophageal cancer Neg Hx   . Inflammatory bowel disease Neg Hx    . Liver disease Neg Hx   . Pancreatic cancer Neg Hx   . Rectal cancer Neg Hx   . Stomach cancer Neg Hx     ALLERGIES:  is allergic to shrimp [shellfish allergy].  MEDICATIONS:  Current Outpatient Medications  Medication Sig Dispense Refill  . aspirin EC 81 MG tablet Take 81 mg by mouth every evening.     Marland Kitchen atorvastatin (LIPITOR) 40 MG tablet TAKE (1) TABLET BY MOUTH ONCE DAILY. (Patient taking differently: Take 40 mg by mouth daily. ) 90 tablet 0  . clopidogrel (PLAVIX) 75 MG tablet TAKE (1) TABLET BY MOUTH ONCE DAILY. (Patient taking differently: Take 75 mg by mouth daily. ) 90 tablet 3  . esomeprazole (NEXIUM) 20 MG capsule Take 20 mg by mouth daily.     Marland Kitchen levothyroxine (SYNTHROID, LEVOTHROID) 50 MCG tablet Take 50 mcg by mouth daily before breakfast.    . metoprolol tartrate (LOPRESSOR) 25 MG tablet Take 1 tablet (25 mg total) by mouth 2 (two) times daily. 180 tablet 3  . gabapentin (NEURONTIN) 100 MG capsule Take 200 mg by mouth every 8 (eight) hours as needed.    Marland Kitchen HYDROcodone-acetaminophen (NORCO/VICODIN) 5-325 MG tablet Take 1 tablet by mouth every 6 (six) hours as needed for moderate pain or severe pain. (Patient not taking: Reported on 10/23/2019) 30 tablet 0  . meclizine (ANTIVERT) 12.5 MG tablet Take 12.5 mg by mouth every 8 (eight) hours as needed.    . nitroGLYCERIN (NITROSTAT) 0.4 MG SL tablet Place 1 tablet (0.4 mg total) under the tongue every 5 (five) minutes as needed for chest pain. (Patient not taking: Reported on 10/23/2019) 25 tablet prn   No current facility-administered medications for this visit.     REVIEW OF SYSTEMS:   Constitutional: Denies fevers, chills or abnormal night sweats Eyes: Denies blurriness of vision, double vision or watery eyes Ears, nose, mouth, throat, and face: Denies mucositis or sore throat Respiratory: Denies cough, dyspnea or wheezes Cardiovascular: Denies palpitation, chest discomfort or lower extremity swelling Gastrointestinal:   Denies nausea, heartburn or change in bowel habits Skin: Denies abnormal skin rashes Lymphatics: Denies new lymphadenopathy or easy bruising Neurological:Denies numbness, tingling or new weaknesses Behavioral/Psych: Mood is stable, no new changes  All other systems were reviewed with the patient and are negative.  PHYSICAL EXAMINATION: ECOG PERFORMANCE STATUS: 1 - Symptomatic but completely ambulatory  Vitals:   10/23/19 1409  BP: (!) 126/58  Pulse:  71  Resp: 18  Temp: (!) 96.9 F (36.1 C)  SpO2: 97%   Filed Weights   10/23/19 1409  Weight: 147 lb 12.8 oz (67 kg)    GENERAL:alert, no distress and comfortable SKIN: skin color, texture, turgor are normal, no rashes or significant lesions EYES: normal, conjunctiva are pink and non-injected, sclera clear OROPHARYNX:no exudate, no erythema and lips, buccal mucosa, and tongue normal  NECK: supple, thyroid normal size, non-tender, without nodularity LYMPH:  no palpable lymphadenopathy in the cervical, axillary or inguinal LUNGS: clear to auscultation and percussion with normal breathing effort HEART: regular rate & rhythm and no murmurs and no lower extremity edema ABDOMEN:abdomen soft, non-tender and normal bowel sounds.  Suprapubic surgical wound is well-healed. Musculoskeletal:no cyanosis of digits and no clubbing  PSYCH: alert & oriented x 3 with fluent speech NEURO: no focal motor/sensory deficits  LABORATORY DATA:  I have reviewed the data as listed Lab Results  Component Value Date   WBC 12.6 (H) 10/12/2019   HGB 8.9 (L) 10/12/2019   HCT 30.3 (L) 10/12/2019   MCV 87.1 10/12/2019   PLT 229 10/12/2019     Chemistry      Component Value Date/Time   NA 135 10/12/2019 0412   NA 140 09/08/2019 1420   K 4.4 10/12/2019 0412   CL 103 10/12/2019 0412   CO2 23 10/12/2019 0412   BUN 16 10/12/2019 0412   BUN 19 09/08/2019 1420   CREATININE 1.38 (H) 10/12/2019 0412   CREATININE 1.36 (H) 02/14/2016 1626      Component  Value Date/Time   CALCIUM 8.8 (L) 10/12/2019 0412   ALKPHOS 103 07/21/2019 1159   AST 15 07/21/2019 1159   ALT 8 07/21/2019 1159   BILITOT 0.6 07/21/2019 1159       RADIOGRAPHIC STUDIES: I have personally reviewed the radiological images as listed and agreed with the findings in the report.  ASSESSMENT & PLAN:  Cancer of ascending colon s/p robotic proximal colectomy 10/11/2019 1.  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 -Colonoscopy on 08/03/2019 with right colon mass biopsy consistent with adenocarcinoma. -CT scan of the CAP on 08/08/2019 showed approximately 4.8 cm circumferential mass in the ascending colon with local tumor extension medially along the colon mesentery with paracolic tumor measuring up to 3.5 cm, adjacent small paracolic lymph nodes adjacent to the tumor.  Tiny hypodense lesion in the right hepatic lobe, technically too small to characterize.  Segment 4 small liver lesion appears to be stable cyst. -Preoperative CEA on 08/09/2019 was elevated at 39.5. -She underwent right hemicolectomy on 10/11/2019, grade 3 adenocarcinoma, negative margins, positive LVSI, 2 tumor deposits, 0/15 lymph nodes positive, PT3PN1C. 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 -She has been recovering well from surgery.  However she feels very tired. -I have recommended repeating a CEA level today.  I have also recommended doing a restaging PET CT scan as her CAT scan was more than 2 months ago.  She also reported 20 pound weight loss in the last 3 months. -I will see her back after the PET CT scan to review the results.  Given stage III disease, she will benefit from adjuvant chemotherapy with single agent Xeloda or infusional 5-FU. 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  2.  Normocytic  anemia: -Most recent hemoglobin is 8.9 on 10/12/2019. -I  have recommended checking ferritin, iron panel, I37 and folic acid today.  We will set her up for 2 infusions of Feraheme 1 week apart.  Orders Placed This Encounter  Procedures  . NM PET Image Initial (PI) Skull Base To Thigh    Standing Status:   Future    Standing Expiration Date:   10/22/2020    Order Specific Question:   ** REASON FOR EXAM (FREE TEXT)    Answer:   colon cancer    Order Specific Question:   If indicated for the ordered procedure, I authorize the administration of a radiopharmaceutical per Radiology protocol    Answer:   Yes    Order Specific Question:   Preferred imaging location?    Answer:   [facility]    Order Specific Question:   Radiology Contrast Protocol - do NOT remove file path    Answer:   [email or web address]  . Iron and TIBC    Standing Status:   Future    Number of Occurrences:   1    Standing Expiration Date:   10/22/2020  . Ferritin    Standing Status:   Future    Number of Occurrences:   1    Standing Expiration Date:   10/22/2020  . Vitamin B12    Standing Status:   Future    Number of Occurrences:   1    Standing Expiration Date:   10/22/2020  . Folate    Standing Status:   Future    Number of Occurrences:   1    Standing Expiration Date:   10/22/2020  . CEA    Standing Status:   Future    Number of Occurrences:   1    Standing Expiration Date:   10/22/2020    All questions were answered. The patient knows to call the clinic with any problems, questions or concerns.      Derek Jack, MD 10/23/2019 5:43 PM

## 2019-10-23 NOTE — Patient Instructions (Addendum)
[facility] at [facility] Discharge Instructions  You were seen today by Dr. Delton Coombes. He went over your history, family history and how you've been feeling since your surgery. You have stage 3 colon cancer. Normally we would treat you with chemotherapy. He will have blood drawn and schedule you for a PET scan to further evaluate your cancer. Once we have these results back we will have a better idea of how and what to treat you with. He will go ahead and schedule you for 2 doses of Feraheme for your iron level. He will see you back after the scan for follow up.   Thank you for choosing [place] at [facility] to provide your oncology and hematology care.  To afford each patient quality time with our provider, please arrive at least 15 minutes before your scheduled appointment time.   If you have a lab appointment with the [facility] please come in thru the  Main Entrance and check in at the main information desk  You need to re-schedule your appointment should you arrive 10 or more minutes late.  We strive to give you quality time with our providers, and arriving late affects you and other patients whose appointments are after yours.  Also, if you no show three or more times for appointments you may be dismissed from the clinic at the providers discretion.     Again, thank you for choosing [facility].  Our hope is that these requests will decrease the amount of time that you wait before being seen by our physicians.       _____________________________________________________________  Should you have questions after your visit to [facility], please contact our office at [phone] between the hours of 8:00 a.m. and 4:30 p.m.  Voicemails left after 4:00 p.m. will not be returned until the following business day.  For prescription refill requests, have your pharmacy contact our office and allow 72 hours.    Cancer  Center Support Programs:   > Cancer Support Group  2nd Tuesday of the month 1pm-2pm, Journey Room

## 2019-10-23 NOTE — Progress Notes (Signed)
I have emailed pathology to add BRAF and MLH1 hyper methylation to [phone] per Dr. Delton Coombes.

## 2019-10-23 NOTE — Assessment & Plan Note (Addendum)
1.  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 -Colonoscopy on 08/03/2019 with right colon mass biopsy consistent with adenocarcinoma. -CT scan of the CAP on 08/08/2019 showed approximately 4.8 cm circumferential mass in the ascending colon with local tumor extension medially along the colon mesentery with paracolic tumor measuring up to 3.5 cm, adjacent small paracolic lymph nodes adjacent to the tumor.  Tiny hypodense lesion in the right hepatic lobe, technically too small to characterize.  Segment 4 small liver lesion appears to be stable cyst. -Preoperative CEA on 08/09/2019 was elevated at 39.5. -She underwent right hemicolectomy on 10/11/2019, grade 3 adenocarcinoma, negative margins, positive LVSI, 2 tumor deposits, 0/15 lymph nodes positive, PT3PN1C. 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 -She has been recovering well from surgery.  However she feels very tired. -I have recommended repeating a CEA level today.  I have also recommended doing a restaging PET CT scan as her CAT scan was more than 2 months ago.  She also reported 20 pound weight loss in the last 3 months. -I will see her back after the PET CT scan to review the results.  Given stage III disease, she will benefit from adjuvant chemotherapy with single agent Xeloda or infusional 5-FU. 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  2.  Normocytic anemia: -Most recent hemoglobin is 8.9 on 10/12/2019. -I have recommended checking ferritin, iron panel, U88 and folic acid today.  We will set her up for 2 infusions of Feraheme 1 week apart.

## 2019-10-23 NOTE — Progress Notes (Signed)
I met with patient and her daughter today during the visit with Dr. Delton Coombes.  I explained to her how I am involved in her care.  I gave her my contact information and advised to call me should she have any questions or concerns.

## 2019-10-24 LAB — CEA: CEA: 15.4 ng/mL — ABNORMAL HIGH (ref 0.0–4.7)

## 2019-10-27 ENCOUNTER — Encounter (HOSPITAL_COMMUNITY): Payer: Self-pay

## 2019-10-27 ENCOUNTER — Inpatient Hospital Stay (HOSPITAL_COMMUNITY): Payer: Medicare Other | Attending: Nurse Practitioner

## 2019-10-27 ENCOUNTER — Other Ambulatory Visit: Payer: Self-pay

## 2019-10-27 VITALS — BP 151/52 | HR 62 | Temp 97.5°F | Resp 18

## 2019-10-27 DIAGNOSIS — N183 Chronic kidney disease, stage 3 unspecified: Secondary | ICD-10-CM

## 2019-10-27 DIAGNOSIS — C182 Malignant neoplasm of ascending colon: Secondary | ICD-10-CM | POA: Insufficient documentation

## 2019-10-27 DIAGNOSIS — Z7982 Long term (current) use of aspirin: Secondary | ICD-10-CM | POA: Diagnosis not present

## 2019-10-27 DIAGNOSIS — I2510 Atherosclerotic heart disease of native coronary artery without angina pectoris: Secondary | ICD-10-CM | POA: Insufficient documentation

## 2019-10-27 DIAGNOSIS — K219 Gastro-esophageal reflux disease without esophagitis: Secondary | ICD-10-CM | POA: Insufficient documentation

## 2019-10-27 DIAGNOSIS — I10 Essential (primary) hypertension: Secondary | ICD-10-CM | POA: Diagnosis not present

## 2019-10-27 DIAGNOSIS — Z79899 Other long term (current) drug therapy: Secondary | ICD-10-CM | POA: Insufficient documentation

## 2019-10-27 DIAGNOSIS — Z85828 Personal history of other malignant neoplasm of skin: Secondary | ICD-10-CM | POA: Diagnosis not present

## 2019-10-27 DIAGNOSIS — Z808 Family history of malignant neoplasm of other organs or systems: Secondary | ICD-10-CM | POA: Diagnosis not present

## 2019-10-27 DIAGNOSIS — D509 Iron deficiency anemia, unspecified: Secondary | ICD-10-CM | POA: Insufficient documentation

## 2019-10-27 DIAGNOSIS — Z87891 Personal history of nicotine dependence: Secondary | ICD-10-CM | POA: Insufficient documentation

## 2019-10-27 DIAGNOSIS — M1990 Unspecified osteoarthritis, unspecified site: Secondary | ICD-10-CM | POA: Insufficient documentation

## 2019-10-27 DIAGNOSIS — E039 Hypothyroidism, unspecified: Secondary | ICD-10-CM | POA: Diagnosis not present

## 2019-10-27 DIAGNOSIS — Z7902 Long term (current) use of antithrombotics/antiplatelets: Secondary | ICD-10-CM | POA: Diagnosis not present

## 2019-10-27 DIAGNOSIS — Z8041 Family history of malignant neoplasm of ovary: Secondary | ICD-10-CM | POA: Diagnosis not present

## 2019-10-27 DIAGNOSIS — D508 Other iron deficiency anemias: Secondary | ICD-10-CM

## 2019-10-27 MED ORDER — SODIUM CHLORIDE 0.9 % IV SOLN
Freq: Once | INTRAVENOUS | Status: AC
  Administered 2019-10-27: 11:00:00 via INTRAVENOUS

## 2019-10-27 MED ORDER — SODIUM CHLORIDE 0.9 % IV SOLN
510.0000 mg | Freq: Once | INTRAVENOUS | Status: AC
  Administered 2019-10-27: 510 mg via INTRAVENOUS
  Filled 2019-10-27: qty 510

## 2019-10-27 NOTE — Patient Instructions (Signed)
[facility] at [facility] Discharge Instructions  Received Feraheme infusion today. Follow-up as scheduled. Call clinic for any questions or concerns   Thank you for choosing [facility] at [facility] to provide your oncology and hematology care.  To afford each patient quality time with our provider, please arrive at least 15 minutes before your scheduled appointment time.   If you have a lab appointment with the Cancer Center please come in thru the Main Entrance and check in at the main information desk.  You need to re-schedule your appointment should you arrive 10 or more minutes late.  We strive to give you quality time with our providers, and arriving late affects you and other patients whose appointments are after yours.  Also, if you no show three or more times for appointments you may be dismissed from the clinic at the providers discretion.     Again, thank you for choosing [facility].  Our hope is that these requests will decrease the amount of time that you wait before being seen by our physicians.       _____________________________________________________________  Should you have questions after your visit to [facility], please contact our office at [phone] between the hours of 8:00 a.m. and 4:30 p.m.  Voicemails left after 4:00 p.m. will not be returned until the following business day.  For prescription refill requests, have your pharmacy contact our office and allow 72 hours.    Due to Covid, you will need to wear a mask upon entering the hospital. If you do not have a mask, a mask will be given to you at the Main Entrance upon arrival. For doctor visits, patients may have 1 support person with them. For treatment visits, patients can not have anyone with them due to social distancing guidelines and our immunocompromised population.     

## 2019-10-27 NOTE — Progress Notes (Signed)
Jessica Taylor tolerated Feraheme infusion well without complaints or incident. VSS upon discharge. Peripheral IV site checked with positive blood return noted prior to and after infusion. Pt discharged self ambulatory in satisfactory condition

## 2019-10-30 ENCOUNTER — Other Ambulatory Visit (HOSPITAL_COMMUNITY): Payer: Medicare Other

## 2019-11-02 ENCOUNTER — Other Ambulatory Visit: Payer: Self-pay

## 2019-11-03 ENCOUNTER — Encounter (HOSPITAL_COMMUNITY): Payer: Self-pay

## 2019-11-03 ENCOUNTER — Inpatient Hospital Stay (HOSPITAL_COMMUNITY): Payer: Medicare Other

## 2019-11-03 ENCOUNTER — Other Ambulatory Visit: Payer: Self-pay

## 2019-11-03 VITALS — BP 130/58 | HR 58 | Temp 97.6°F | Resp 18

## 2019-11-03 DIAGNOSIS — D508 Other iron deficiency anemias: Secondary | ICD-10-CM

## 2019-11-03 DIAGNOSIS — N183 Chronic kidney disease, stage 3 unspecified: Secondary | ICD-10-CM

## 2019-11-03 DIAGNOSIS — C182 Malignant neoplasm of ascending colon: Secondary | ICD-10-CM | POA: Diagnosis not present

## 2019-11-03 MED ORDER — SODIUM CHLORIDE 0.9 % IV SOLN
510.0000 mg | Freq: Once | INTRAVENOUS | Status: AC
  Administered 2019-11-03: 510 mg via INTRAVENOUS
  Filled 2019-11-03: qty 510

## 2019-11-03 MED ORDER — SODIUM CHLORIDE 0.9 % IV SOLN
Freq: Once | INTRAVENOUS | Status: AC
  Administered 2019-11-03: 10:00:00 via INTRAVENOUS

## 2019-11-03 NOTE — Progress Notes (Signed)
Patient tolerated iron infusion with no complaints voiced.  Peripheral IV site clean and dry with good blood return noted before and after infusion.  Band aid applied.  VSS with discharge and left ambulatory with no s/s of distress noted.  

## 2019-11-13 ENCOUNTER — Other Ambulatory Visit: Payer: Self-pay

## 2019-11-13 ENCOUNTER — Ambulatory Visit (HOSPITAL_COMMUNITY)
Admission: RE | Admit: 2019-11-13 | Discharge: 2019-11-13 | Disposition: A | Discharge status: Home / Self Care | Payer: Medicare Other | Source: Ambulatory Visit | Attending: Hematology | Admitting: Hematology

## 2019-11-13 DIAGNOSIS — C182 Malignant neoplasm of ascending colon: Secondary | ICD-10-CM | POA: Diagnosis not present

## 2019-11-13 MED ORDER — FLUDEOXYGLUCOSE F - 18 (FDG) INJECTION
8.9600 | Freq: Once | INTRAVENOUS | Status: AC | PRN
  Administered 2019-11-13: 8.96 via INTRAVENOUS

## 2019-11-15 ENCOUNTER — Other Ambulatory Visit: Payer: Self-pay

## 2019-11-15 ENCOUNTER — Inpatient Hospital Stay (HOSPITAL_COMMUNITY): Payer: Medicare Other | Admitting: Hematology

## 2019-11-15 ENCOUNTER — Encounter (HOSPITAL_COMMUNITY): Payer: Self-pay | Admitting: Hematology

## 2019-11-15 VITALS — BP 137/67 | HR 58 | Temp 97.2°F | Resp 20 | Wt 144.2 lb

## 2019-11-15 DIAGNOSIS — C182 Malignant neoplasm of ascending colon: Secondary | ICD-10-CM

## 2019-11-15 DIAGNOSIS — Z1231 Encounter for screening mammogram for malignant neoplasm of breast: Secondary | ICD-10-CM

## 2019-11-15 NOTE — Patient Instructions (Addendum)
[place] at [facility] Discharge Instructions  You were seen today by Dr. Delton Coombes. He went over your recent test results. You have stage 3 cancer, he discussed your treatment options and what his recommendations are. He also mentioned just monitoring you every few months with scans and lab work. He will schedule you for a screening mammogram. He will see you back in 3 weeks for labs and follow up.   Thank you for choosing [place] at [facility] to provide your oncology and hematology care.  To afford each patient quality time with our provider, please arrive at least 15 minutes before your scheduled appointment time.   If you have a lab appointment with the [facility] please come in thru the  Main Entrance and check in at the main information desk  You need to re-schedule your appointment should you arrive 10 or more minutes late.  We strive to give you quality time with our providers, and arriving late affects you and other patients whose appointments are after yours.  Also, if you no show three or more times for appointments you may be dismissed from the clinic at the providers discretion.     Again, thank you for choosing [facility].  Our hope is that these requests will decrease the amount of time that you wait before being seen by our physicians.       _____________________________________________________________  Should you have questions after your visit to [facility], please contact our office at [phone] between the hours of 8:00 a.m. and 4:30 p.m.  Voicemails left after 4:00 p.m. will not be returned until the following business day.  For prescription refill requests, have your pharmacy contact our office and allow 72 hours.    Cancer Center Support Programs:   > Cancer Support Group  2nd Tuesday of the month 1pm-2pm, Journey Room

## 2019-11-15 NOTE — Progress Notes (Signed)
Jessica Taylor, Jessica Taylor [id number]   CLINIC:  Medical Oncology/Hematology  PCP:  Antony Contras, MD [place] [id number] [phone]   REASON FOR VISIT:  Follow-up for ascending colon cancer.  CURRENT THERAPY: Observation.  BRIEF ONCOLOGIC HISTORY:  Oncology History   No history exists.     CANCER STAGING: Cancer Staging Cancer of ascending colon s/p robotic proximal colectomy 10/11/2019 Staging form: Colon and Rectum, AJCC 8th Edition - Clinical stage from 10/23/2019: Stage IIIB (cT3, cN1c, cM0) - Unsigned    INTERVAL HISTORY:  Jessica Taylor 78 y.o. female seen with her daughter for follow-up of ascending colon cancer. She did receive her last Feraheme infusion on 11/03/2019.  She also had on a PET CT scan.  Reports improvement in her energy and appetite levels to about 75%.  Denies any pains.  Reports bruising on the legs.  Denies any nausea vomiting diarrhea or constipation.  Denies any fevers or chills.    REVIEW OF SYSTEMS:  Review of Systems  Hematological: Bruises/bleeds easily.  All other systems reviewed and are negative.    PAST MEDICAL/SURGICAL HISTORY:  Past Medical History:  Diagnosis Date  . Abdominal aortic aneurysm ([place])   . Acid reflux   . Anemia   . Arthritis    knees , R shoulder - tx /w injection - 11/2014  . Basal cell carcinoma (BCC) of dorsum of nose 2010   Resolved  . Cancer ([place])    basal cell on nose  . Colon cancer ([name])   . Coronary artery disease   . Hypercholesteremia   . Hypertension   . Hypothyroidism   . Lt Acute pyelonephritis 09/11/2018  . MI, old 49  . Peripheral arterial disease (HCC)    hhigh-grade ostial bilateral calcified iliac stenosis with claudication  . Tobacco abuse   . Vertigo    when lays on left side.   Past Surgical History:  Procedure Laterality Date  . CARDIAC CATHETERIZATION  5 stents  . CARDIAC CATHETERIZATION N/A 01/20/2016   Procedure: Left Heart Cath and Coronary Angiography;  Surgeon: Peter M Martinique, MD;  Location: [place] [facility];  Service: Cardiovascular;  Laterality: N/A;  . CATARACT EXTRACTION W/PHACO Left 05/24/2014   Procedure: CATARACT EXTRACTION PHACO AND INTRAOCULAR LENS PLACEMENT (IOC);  Surgeon: [place]ny Branch, MD;  Location: [facility];  Service: Ophthalmology;  Laterality: Left;  CDE:  9.30  . CATARACT EXTRACTION W/PHACO Right 06/18/2014   Procedure: CATARACT EXTRACTION PHACO AND INTRAOCULAR LENS PLACEMENT RIGHT EYE CDE=10.84;  Surgeon: [place]ny Branch, MD;  Location: [facility];  Service: Ophthalmology;  Laterality: Right;  . CORONARY ARTERY BYPASS GRAFT N/A 01/24/2016   Procedure: CORONARY ARTERY BYPASS GRAFTING (CABG);  Surgeon: Gaye Pollack, MD;  Location: [place];  Service: Open Heart Surgery;  Laterality: N/A;  . CORONARY STENT PLACEMENT    . CORONARY STENT PLACEMENT  03/06/15   CFX DES  . CYSTOSCOPY/URETEROSCOPY/HOLMIUM LASER/STENT PLACEMENT Left 09/12/2018   Procedure: CYSTOSCOPY/URETEROSCOPY/STENT PLACEMENT;  Surgeon: Ceasar Mons, MD;  Location: [facility];  Service: Urology;  Laterality: Left;  . EYE SURGERY    . LEFT HEART CATH AND CORS/GRAFTS ANGIOGRAPHY N/A 09/12/2019   Procedure: LEFT HEART CATH AND CORS/GRAFTS ANGIOGRAPHY;  Surgeon: Troy Sine, MD;  Location: [place] [facility];  Service: Cardiovascular;  Laterality: N/A;  . LEFT HEART CATHETERIZATION WITH CORONARY ANGIOGRAM N/A 03/06/2015   Procedure: LEFT HEART CATHETERIZATION WITH CORONARY ANGIOGRAM;  Surgeon: Pearletha Forge  Gwenlyn Found, MD;  Location: [facility] CATH [facility];  Service: Cardiovascular;  Laterality: N/A;  . PARTIAL KNEE ARTHROPLASTY Right 02/04/2015   Procedure: UNICOMPARTMENTAL KNEE;  Surgeon: Dorna Leitz, MD;  Location: [place];  Service: Orthopedics;  Laterality: Right;  . PERIPHERAL VASCULAR CATHETERIZATION Bilateral 05/13/2015   Procedure: Lower Extremity Angiography;  Surgeon: Lorretta Harp, MD;  Location: [place] [facility];  Service:  Cardiovascular;  Laterality: Bilateral;  . PERIPHERAL VASCULAR CATHETERIZATION N/A 05/13/2015   Procedure: Abdominal Aortogram;  Surgeon: Lorretta Harp, MD;  Location: [place] [facility];  Service: Cardiovascular;  Laterality: N/A;  . PERIPHERAL VASCULAR CATHETERIZATION Bilateral 06/27/2015   Procedure: Peripheral Vascular Intervention;  Surgeon: Lorretta Harp, MD;  Location: [place] [facility];  Service: Cardiovascular;  Laterality: Bilateral;  ILIACS  . PERIPHERAL VASCULAR CATHETERIZATION Bilateral 06/27/2015   Procedure: Peripheral Vascular Atherectomy;  Surgeon: Lorretta Harp, MD;  Location: [place] [facility];  Service: Cardiovascular;  Laterality: Bilateral;  . TEE WITHOUT CARDIOVERSION N/A 01/24/2016   Procedure: TRANSESOPHAGEAL ECHOCARDIOGRAM (TEE);  Surgeon: Gaye Pollack, MD;  Location: [place];  Service: Open Heart Surgery;  Laterality: N/A;  . TONSILLECTOMY     age 20  . TUBAL LIGATION       SOCIAL HISTORY:  Social History   Socioeconomic History  . Marital status: Married    Spouse name: [name]  . Number of children: 4  . Years of education: Not on file  . Highest education level: Not on file  Occupational History  . Occupation: retired    Comment: worked as Presenter, broadcasting for [employer]  Tobacco Use  . Smoking status: Former Smoker    Packs/day: 1.50    Years: 42.00    Pack years: 63.00    Types: Cigarettes    Quit date: 05/19/1999    Years since quitting: 20.5  . Smokeless tobacco: Never Used  Substance and Sexual Activity  . Alcohol use: No  . Drug use: No  . Sexual activity: Yes    Birth control/protection: Surgical  Other Topics Concern  . Not on file  Social History Narrative  . Not on file   Social Determinants of Health   Financial Resource Strain:   . Difficulty of Paying Living Expenses: Not on file  Food Insecurity:   . Worried About Charity fundraiser in the Last Year: Not on file  . Ran Out of Food in the Last Year: Not on file    Transportation Needs:   . Lack of Transportation (Medical): Not on file  . Lack of Transportation (Non-Medical): Not on file  Physical Activity:   . Days of Exercise per Week: Not on file  . Minutes of Exercise per Session: Not on file  Stress:   . Feeling of Stress : Not on file  Social Connections:   . Frequency of Communication with Friends and Family: Not on file  . Frequency of Social Gatherings with Friends and Family: Not on file  . Attends Religious Services: Not on file  . Active Member of Clubs or Organizations: Not on file  . Attends Archivist Meetings: Not on file  . Marital Status: Not on file  Intimate Partner Violence:   . Fear of Current or Ex-Partner: Not on file  . Emotionally Abused: Not on file  . Physically Abused: Not on file  . Sexually Abused: Not on file    FAMILY HISTORY:  Family History  Problem Relation Age of Onset  .  Ovarian cancer Mother 18  . Cancer Father 67       unsure of which kind, "it was in his glands"  . Hypertension Maternal Grandmother   . Stroke Maternal Grandfather   . Hypertension Son   . Brain cancer Sister   . Hypertension Daughter   . Heart attack Neg Hx   . Colon cancer Neg Hx   . Esophageal cancer Neg Hx   . Inflammatory bowel disease Neg Hx   . Liver disease Neg Hx   . Pancreatic cancer Neg Hx   . Rectal cancer Neg Hx   . Stomach cancer Neg Hx     CURRENT MEDICATIONS:  Outpatient Encounter Medications as of 11/15/2019  Medication Sig  . aspirin EC 81 MG tablet Take 81 mg by mouth every evening.   Marland Kitchen atorvastatin (LIPITOR) 40 MG tablet TAKE (1) TABLET BY MOUTH ONCE DAILY.  Marland Kitchen clopidogrel (PLAVIX) 75 MG tablet TAKE (1) TABLET BY MOUTH ONCE DAILY. (Patient taking differently: Take 75 mg by mouth daily. )  . esomeprazole (NEXIUM) 20 MG capsule Take 20 mg by mouth daily.   Marland Kitchen levothyroxine (SYNTHROID, LEVOTHROID) 50 MCG tablet Take 50 mcg by mouth daily before breakfast.  . metoprolol tartrate (LOPRESSOR) 25 MG  tablet Take 1 tablet (25 mg total) by mouth 2 (two) times daily.  Marland Kitchen gabapentin (NEURONTIN) 100 MG capsule Take 200 mg by mouth every 8 (eight) hours as needed.  Marland Kitchen HYDROcodone-acetaminophen (NORCO/VICODIN) 5-325 MG tablet Take 1 tablet by mouth every 6 (six) hours as needed for moderate pain or severe pain. (Patient not taking: Reported on 11/15/2019)  . meclizine (ANTIVERT) 12.5 MG tablet Take 12.5 mg by mouth every 8 (eight) hours as needed.  . nitroGLYCERIN (NITROSTAT) 0.4 MG SL tablet Place 1 tablet (0.4 mg total) under the tongue every 5 (five) minutes as needed for chest pain. (Patient not taking: Reported on 11/15/2019)   No facility-administered encounter medications on file as of 11/15/2019.    ALLERGIES:  Allergies  Allergen Reactions  . Shrimp [Shellfish Allergy] Nausea And Vomiting    Throws up violently     PHYSICAL EXAM:  ECOG Performance status: 1  Vitals:   11/15/19 1534  BP: 137/67  Pulse: (!) 58  Resp: 20  Temp: (!) 97.2 F (36.2 C)  SpO2: 97%   Filed Weights   11/15/19 1534  Weight: 144 lb 3.2 oz (65.4 kg)    Physical Exam Vitals reviewed.  Constitutional:      Appearance: Normal appearance.  Cardiovascular:     Rate and Rhythm: Normal rate and regular rhythm.     Heart sounds: Normal heart sounds.  Pulmonary:     Effort: Pulmonary effort is normal.     Breath sounds: Normal breath sounds.  Abdominal:     General: There is no distension.     Palpations: Abdomen is soft. There is no mass.  Lymphadenopathy:     Cervical: No cervical adenopathy.  Skin:    General: Skin is warm.     Findings: Bruising present.  Neurological:     General: No focal deficit present.     Mental Status: She is alert and oriented to person, place, and time.  Psychiatric:        Mood and Affect: Mood normal.        Behavior: Behavior normal.      LABORATORY DATA:  I have reviewed the labs as listed.  CBC    Component Value Date/Time   WBC 12.6 (  H) 10/12/2019  0412   RBC 3.48 (L) 10/12/2019 0412   HGB 8.9 (L) 10/12/2019 0412   HGB 11.0 (L) 09/08/2019 1403   HCT 30.3 (L) 10/12/2019 0412   HCT 34.8 09/08/2019 1403   PLT 229 10/12/2019 0412   PLT 264 09/08/2019 1403   MCV 87.1 10/12/2019 0412   MCV 81 09/08/2019 1403   MCH 25.6 (L) 10/12/2019 0412   MCHC 29.4 (L) 10/12/2019 0412   RDW 17.6 (H) 10/12/2019 0412   RDW 20.8 (H) 09/08/2019 1403   LYMPHSABS 0.6 (L) 09/11/2018 1918   MONOABS 0.6 09/11/2018 1918   EOSABS 0.1 09/11/2018 1918   BASOSABS 0.1 09/11/2018 1918   CMP Latest Ref Rng & Units 10/12/2019 10/10/2019 09/08/2019  Glucose 70 - 99 mg/dL 135(H) 114(H) 93  BUN 8 - 23 mg/dL _0 Creatinine 0.44 - 1.00 mg/dL 1.38(H) 1.42(H) 1.37(H)  Sodium 135 - 145 mmol/L 135 141 140  Potassium 3.5 - 5.1 mmol/L 4.4 4.0 4.9  Chloride 98 - 111 mmol/L 103 105 102  CO2 22 - 32 mmol/L _1 Calcium 8.9 - 10.3 mg/dL 8.8(L) 9.8 9.8  Total Protein 6.0 - 8.3 g/dL - - -  Total Bilirubin 0.2 - 1.2 mg/dL - - -  Alkaline Phos 39 - 117 U/L - - -  AST 0 - 37 U/L - - -  ALT 0 - 35 U/L - - -       DIAGNOSTIC IMAGING:  I have independently reviewed the scans and discussed with the patient.    ASSESSMENT & PLAN:   Cancer of ascending colon s/p robotic proximal colectomy 10/11/2019 1.  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 -Colonoscopy on 08/03/2019 with right colon mass biopsy consistent with adenocarcinoma. -CT scan of the CAP on 08/08/2019 showed approximately 4.8 cm circumferential mass in the ascending colon with local tumor extension medially along the colon mesentery with paracolic tumor measuring up to 3.5 cm, adjacent small paracolic lymph nodes adjacent to the tumor.  Tiny hypodense lesion in the right hepatic lobe, technically too small to characterize.  Segment 4 small liver lesion appears to be stable cyst. -Preoperative CEA on 08/09/2019 was elevated  at 39.5. -She underwent right hemicolectomy on 10/11/2019, grade 3 adenocarcinoma, negative margins, positive LVSI, 2 tumor deposits, 0/15 lymph nodes positive, PT3PN1C. 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 -She lives at home and is independent of ADLs and some IADLs. -We have ordered BRAF testing and MLH1 hyper methylation testing which are pending at this time. -We reviewed PET scan which did not show any metastatic disease.  There is a lymph node in the right axillary region, which is not clinically palpable.  She does not report any injury to the right upper extremity. -We have recommended mammogram. -We have discussed the pros and cons of adjuvant chemotherapy and its side effects.  Patient preferred clinical observation. -CEA is down to 15.4 on 10/23/2019.  I plan to repeat another CEA level at next visit after the mammogram.  2.  Normocytic anemia: -Ferritin was 47 and percent saturation was 11.  She received Feraheme on 10/27/2019 and 01/03/2019.  Total time spent is 25 minutes with more than 50% of the time spent face-to-face discussing scan results, treatment plan, counseling and coordination of care.    Orders placed this encounter:  Orders Placed This Encounter  Procedures  . MM 3D SCREEN BREAST BILATERAL  .  CBC with Differential/Platelet  . Iron and TIBC  . Ferritin  . CEA      Derek Jack, MD [place] [phone]

## 2019-11-15 NOTE — Assessment & Plan Note (Signed)
1.  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 -Colonoscopy on 08/03/2019 with right colon mass biopsy consistent with adenocarcinoma. -CT scan of the CAP on 08/08/2019 showed approximately 4.8 cm circumferential mass in the ascending colon with local tumor extension medially along the colon mesentery with paracolic tumor measuring up to 3.5 cm, adjacent small paracolic lymph nodes adjacent to the tumor.  Tiny hypodense lesion in the right hepatic lobe, technically too small to characterize.  Segment 4 small liver lesion appears to be stable cyst. -Preoperative CEA on 08/09/2019 was elevated at 39.5. -She underwent right hemicolectomy on 10/11/2019, grade 3 adenocarcinoma, negative margins, positive LVSI, 2 tumor deposits, 0/15 lymph nodes positive, PT3PN1C. 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 -She lives at home and is independent of ADLs and some IADLs. -We have ordered BRAF testing and MLH1 hyper methylation testing which are pending at this time. -We reviewed PET scan which did not show any metastatic disease.  There is a lymph node in the right axillary region, which is not clinically palpable.  She does not report any injury to the right upper extremity. -We have recommended mammogram. -We have discussed the pros and cons of adjuvant chemotherapy and its side effects.  Patient preferred clinical observation. -CEA is down to 15.4 on 10/23/2019.  I plan to repeat another CEA level at next visit after the mammogram.  2.  Normocytic anemia: -Ferritin was 47 and percent saturation was 11.  She received Feraheme on 10/27/2019 and 01/03/2019.

## 2019-11-22 ENCOUNTER — Ambulatory Visit (HOSPITAL_COMMUNITY): Admission: RE | Admit: 2019-11-22 | Payer: Medicare Other | Source: Ambulatory Visit

## 2019-11-22 ENCOUNTER — Other Ambulatory Visit (HOSPITAL_COMMUNITY): Payer: Self-pay | Admitting: Hematology

## 2019-11-22 ENCOUNTER — Other Ambulatory Visit: Payer: Self-pay

## 2019-11-22 ENCOUNTER — Inpatient Hospital Stay (HOSPITAL_COMMUNITY): Payer: Medicare Other

## 2019-11-22 DIAGNOSIS — C182 Malignant neoplasm of ascending colon: Secondary | ICD-10-CM | POA: Diagnosis not present

## 2019-11-22 DIAGNOSIS — Z1231 Encounter for screening mammogram for malignant neoplasm of breast: Secondary | ICD-10-CM

## 2019-11-22 DIAGNOSIS — R599 Enlarged lymph nodes, unspecified: Secondary | ICD-10-CM

## 2019-11-22 LAB — CBC WITH DIFFERENTIAL/PLATELET
Abs Immature Granulocytes: 0.01 10*3/uL (ref 0.00–0.07)
Basophils Absolute: 0.1 10*3/uL (ref 0.0–0.1)
Basophils Relative: 1 %
Eosinophils Absolute: 0 10*3/uL (ref 0.0–0.5)
Eosinophils Relative: 0 %
HCT: 43.5 % (ref 36.0–46.0)
Hemoglobin: 13 g/dL (ref 12.0–15.0)
Immature Granulocytes: 0 %
Lymphocytes Relative: 19 %
Lymphs Abs: 1.1 10*3/uL (ref 0.7–4.0)
MCH: 27.7 pg (ref 26.0–34.0)
MCHC: 29.9 g/dL — ABNORMAL LOW (ref 30.0–36.0)
MCV: 92.8 fL (ref 80.0–100.0)
Monocytes Absolute: 0.3 10*3/uL (ref 0.1–1.0)
Monocytes Relative: 6 %
Neutro Abs: 4 10*3/uL (ref 1.7–7.7)
Neutrophils Relative %: 74 %
Platelets: 215 10*3/uL (ref 150–400)
RBC: 4.69 MIL/uL (ref 3.87–5.11)
RDW: 18.1 % — ABNORMAL HIGH (ref 11.5–15.5)
WBC: 5.5 10*3/uL (ref 4.0–10.5)
nRBC: 0 % (ref 0.0–0.2)

## 2019-11-22 LAB — IRON AND TIBC
Iron: 83 ug/dL (ref 28–170)
Saturation Ratios: 31 % (ref 10.4–31.8)
TIBC: 269 ug/dL (ref 250–450)
UIBC: 186 ug/dL

## 2019-11-22 LAB — FERRITIN: Ferritin: 230 ng/mL (ref 11–307)

## 2019-11-23 LAB — CEA: CEA: 10.7 ng/mL — ABNORMAL HIGH (ref 0.0–4.7)

## 2019-11-24 HISTORY — PX: BREAST BIOPSY: SHX20

## 2019-11-27 ENCOUNTER — Ambulatory Visit (HOSPITAL_COMMUNITY): Payer: Medicare Other | Admitting: Hematology

## 2019-11-28 ENCOUNTER — Ambulatory Visit (HOSPITAL_COMMUNITY): Admission: RE | Admit: 2019-11-28 | Payer: Medicare Other | Source: Ambulatory Visit

## 2019-11-28 ENCOUNTER — Ambulatory Visit (HOSPITAL_COMMUNITY): Payer: Medicare Other

## 2019-11-28 ENCOUNTER — Other Ambulatory Visit (HOSPITAL_COMMUNITY): Payer: Self-pay | Admitting: Hematology

## 2019-11-28 ENCOUNTER — Ambulatory Visit (HOSPITAL_COMMUNITY)
Admission: RE | Admit: 2019-11-28 | Discharge: 2019-11-28 | Disposition: A | Discharge status: Home / Self Care | Payer: Medicare Other | Source: Ambulatory Visit | Attending: Hematology | Admitting: Hematology

## 2019-11-28 ENCOUNTER — Inpatient Hospital Stay (HOSPITAL_COMMUNITY): Admission: RE | Admit: 2019-11-28 | Payer: Medicare Other | Source: Ambulatory Visit

## 2019-11-28 ENCOUNTER — Other Ambulatory Visit (HOSPITAL_COMMUNITY): Payer: Medicare Other

## 2019-11-28 ENCOUNTER — Other Ambulatory Visit: Payer: Self-pay

## 2019-11-28 DIAGNOSIS — R599 Enlarged lymph nodes, unspecified: Secondary | ICD-10-CM | POA: Diagnosis present

## 2019-11-28 DIAGNOSIS — R928 Other abnormal and inconclusive findings on diagnostic imaging of breast: Secondary | ICD-10-CM

## 2019-12-04 ENCOUNTER — Ambulatory Visit
Admission: EM | Admit: 2019-12-04 | Discharge: 2019-12-04 | Disposition: A | Discharge status: Home / Self Care | Payer: Medicare Other | Attending: Emergency Medicine | Admitting: Emergency Medicine

## 2019-12-04 DIAGNOSIS — R1031 Right lower quadrant pain: Secondary | ICD-10-CM | POA: Diagnosis present

## 2019-12-04 DIAGNOSIS — R300 Dysuria: Secondary | ICD-10-CM

## 2019-12-04 DIAGNOSIS — R1032 Left lower quadrant pain: Secondary | ICD-10-CM

## 2019-12-04 LAB — POCT URINALYSIS DIP (MANUAL ENTRY)
Glucose, UA: NEGATIVE mg/dL
Leukocytes, UA: NEGATIVE
Nitrite, UA: NEGATIVE
Protein Ur, POC: 100 mg/dL — AB
Spec Grav, UA: 1.02 (ref 1.010–1.025)
Urobilinogen, UA: 0.2 E.U./dL
pH, UA: 5.5 (ref 5.0–8.0)

## 2019-12-04 MED ORDER — PHENAZOPYRIDINE HCL 100 MG PO TABS: 100.0000 mg | ORAL_TABLET | Freq: Three times a day (TID) | ORAL | 0 refills | Status: DC | PRN

## 2019-12-04 NOTE — Discharge Instructions (Addendum)
Point-of-care urine show cloudy urine with large red blood cell and protein. Urine culture sent.  We will call you with the results.   Push fluids and get plenty of rest.   Take antibiotic as directed and to completion Take pyridium as prescribed and as needed for symptomatic relief Follow up with PCP if symptoms persists Return here or go to ER if you have any new or worsening symptoms such as fever, worsening abdominal pain, nausea/vomiting, flank pain

## 2019-12-04 NOTE — ED Triage Notes (Signed)
Has h/o kidney stones

## 2019-12-04 NOTE — ED Provider Notes (Signed)
[facility]    CSN: [id number] Arrival date & time: 12/04/19  1325      History   Chief Complaint Chief Complaint  Patient presents with  . Dysuria  . Abdominal Pain    HPI KAELY HOLLAN is a 79 y.o. female.   Tiernan Millikin 79 years old female presented to the urgent care with a complaint of dysuria and lower abdominal pain for the past 1 day.  She denies a precipitating event or recent sexual encounter.  Reports she has not tried any medication.  Her symptoms are made worse with urination.  She reports similar symptoms in the past that improved with antibiotic treatment.  She complains of decreased amount, increased urgency.  She denies fever, chills, nausea, vomiting, abdominal pain, flank pain, hematuria, or incontinence  The history is provided by the patient. No language interpreter was used.  Dysuria Associated symptoms: abdominal pain   Abdominal Pain Associated symptoms: dysuria     Past Medical History:  Diagnosis Date  . Abdominal aortic aneurysm ([place])   . Acid reflux   . Anemia   . Arthritis    knees , R shoulder - tx /w injection - 11/2014  . Basal cell carcinoma (BCC) of dorsum of nose 2010   Resolved  . Cancer ([place])    basal cell on nose  . Colon cancer ([place])   . Coronary artery disease   . Hypercholesteremia   . Hypertension   . Hypothyroidism   . Lt Acute pyelonephritis 09/11/2018  . MI, old 11  . Peripheral arterial disease (H[facility])    hhigh-grade ostial bilateral calcified iliac stenosis with claudication  . Tobacco abuse   . Vertigo    when lays on left side.    Patient Active Problem List   Diagnosis Date Noted  . Cancer of ascending colon s/p robotic proximal colectomy 10/11/2019 10/11/2019  . Dizziness 10/11/2019  . CKD (chronic kidney disease) stage 3, GFR 30-59 ml/min 10/11/2019  . Abnormal nuclear stress test   . Lower abdominal pain 07/23/2019  . Anorexia 07/23/2019  . Abnormal CT of the abdomen 07/23/2019  . Iron  deficiency anemia 09/13/2018  . Acute renal failure with acute tubular necrosis superimposed on stage 2 chronic kidney disease ([place])   . Pyelonephritis 09/11/2018  . 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 Coronary artery disease involving native coronary artery of native heart without angina pectoris   . Hematuria 01/17/2016  . Coronary artery disease involving native coronary artery of native heart with unstable angina pectoris ([place])   . Claudication ([place]) 06/27/2015  . Acute renal insufficiency post PCI- SCr1.34 03/08/2015  . STEMI- urgent CFX PCI 03/06/15 03/06/2015  . 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 Hypokalemia 03/06/2015  . Hyperglycemia 03/06/2015  . PAD (peripheral artery disease) ([place]) 03/06/2015  . Aortoiliac occlusive disease ([place])   . Gastroesophageal reflux disease 02/08/2015  . CAD S/P prior PCI to LAD and RCA 02/08/2015  . Hypothyroidism 02/08/2015  . Hypoxia 02/08/2015  . Status post right partial knee replacement   . Primary osteoarthritis of right knee 02/04/2015  . 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 Atherosclerosis of native coronary artery of native heart without angina pectoris 06/06/2014  . Mixed hyperlipidemia 06/06/2014  . Essential hypertension 06/06/2014    Past Surgical History:  Procedure Laterality Date  . CARDIAC CATHETERIZATION  5 stents  . CARDIAC CATHETERIZATION N/A 01/20/2016   Procedure: Left Heart Cath and Coronary  Angiography;  Surgeon: Peter M Martinique, MD;  Location: [place] [facility];  Service: Cardiovascular;  Laterality: N/A;  . CATARACT EXTRACTION W/PHACO Left 05/24/2014   Procedure: CATARACT EXTRACTION PHACO AND INTRAOCULAR LENS PLACEMENT (IOC);  Surgeon: Tonny Branch, MD;  Location: [facility];  Service: Ophthalmology;  Laterality: Left;  CDE:  9.30  . CATARACT EXTRACTION W/PHACO Right 06/18/2014   Procedure: CATARACT EXTRACTION PHACO AND INTRAOCULAR LENS PLACEMENT RIGHT EYE CDE=10.84;  Surgeon: Tonny Branch, MD;  Location: [facility];  Service: Ophthalmology;  Laterality: Right;  . CORONARY ARTERY BYPASS GRAFT N/A 01/24/2016   Procedure: CORONARY ARTERY BYPASS GRAFTING (CABG);  Surgeon: Gaye Pollack, MD;  Location: [place];  Service: Open Heart Surgery;  Laterality: N/A;  . CORONARY STENT PLACEMENT    . CORONARY STENT PLACEMENT  03/06/15   CFX DES  . CYSTOSCOPY/URETEROSCOPY/HOLMIUM LASER/STENT PLACEMENT Left 09/12/2018   Procedure: CYSTOSCOPY/URETEROSCOPY/STENT PLACEMENT;  Surgeon: Ceasar Mons, MD;  Location: [facility];  Service: Urology;  Laterality: Left;  . EYE SURGERY    . LEFT HEART CATH AND CORS/GRAFTS ANGIOGRAPHY N/A 09/12/2019   Procedure: LEFT HEART CATH AND CORS/GRAFTS ANGIOGRAPHY;  Surgeon: Troy Sine, MD;  Location: [place] [facility];  Service: Cardiovascular;  Laterality: N/A;  . LEFT HEART CATHETERIZATION WITH CORONARY ANGIOGRAM N/A 03/06/2015   Procedure: LEFT HEART CATHETERIZATION WITH CORONARY ANGIOGRAM;  Surgeon: Lorretta Harp, MD;  Location: [facility];  Service: Cardiovascular;  Laterality: N/A;  . PARTIAL KNEE ARTHROPLASTY Right 02/04/2015   Procedure: UNICOMPARTMENTAL KNEE;  Surgeon: Dorna Leitz, MD;  Location: [place];  Service: Orthopedics;  Laterality: Right;  . PERIPHERAL VASCULAR CATHETERIZATION Bilateral 05/13/2015   Procedure: Lower Extremity Angiography;  Surgeon: Lorretta Harp, MD;  Location: [place] [facility];  Service: Cardiovascular;  Laterality: Bilateral;  . PERIPHERAL VASCULAR CATHETERIZATION N/A 05/13/2015   Procedure: Abdominal Aortogram;  Surgeon: Lorretta Harp, MD;  Location: [place] [facility];  Service: Cardiovascular;  Laterality: N/A;  . PERIPHERAL VASCULAR CATHETERIZATION Bilateral 06/27/2015   Procedure: Peripheral Vascular Intervention;  Surgeon: Lorretta Harp, MD;  Location: [place] [facility];  Service: Cardiovascular;  Laterality: Bilateral;  ILIACS  . PERIPHERAL VASCULAR CATHETERIZATION Bilateral 06/27/2015   Procedure: Peripheral Vascular  Atherectomy;  Surgeon: Lorretta Harp, MD;  Location: [place] [facility];  Service: Cardiovascular;  Laterality: Bilateral;  . TEE WITHOUT CARDIOVERSION N/A 01/24/2016   Procedure: TRANSESOPHAGEAL ECHOCARDIOGRAM (TEE);  Surgeon: Gaye Pollack, MD;  Location: [place];  Service: Open Heart Surgery;  Laterality: N/A;  . TONSILLECTOMY     age 23  . TUBAL LIGATION      OB History    Gravida  4   Para  4   Term  4   Preterm      AB      Living  4     SAB      TAB      Ectopic      Multiple      Live Births               Home Medications    Prior to Admission medications   Medication Sig Start Date End Date Taking? Authorizing Provider  aspirin EC 81 MG tablet Take 81 mg by mouth every evening.     [provider]  atorvastatin (LIPITOR) 40 MG tablet TAKE (1) TABLET BY MOUTH ONCE DAILY. 10/24/19   Burtis Junes, NP  clopidogrel (PLAVIX) 75 MG tablet TAKE (1)  TABLET BY MOUTH ONCE DAILY. Patient taking differently: Take 75 mg by mouth daily.  09/07/19   Jerline Pain, MD  esomeprazole (NEXIUM) 20 MG capsule Take 20 mg by mouth daily.     [provider]  gabapentin (NEURONTIN) 100 MG capsule Take 200 mg by mouth every 8 (eight) hours as needed. 10/16/19   [provider]  HYDROcodone-acetaminophen (NORCO/VICODIN) 5-325 MG tablet Take 1 tablet by mouth every 6 (six) hours as needed for moderate pain or severe pain. Patient not taking: Reported on 11/15/2019 10/13/19   Michael Boston, MD  levothyroxine (SYNTHROID, LEVOTHROID) 50 MCG tablet Take 50 mcg by mouth daily before breakfast.    [provider]  meclizine (ANTIVERT) 12.5 MG tablet Take 12.5 mg by mouth every 8 (eight) hours as needed. 10/16/19   [provider]  metoprolol tartrate (LOPRESSOR) 25 MG tablet Take 1 tablet (25 mg total) by mouth 2 (two) times daily. 10/02/19   Jerline Pain, MD  nitroGLYCERIN (NITROSTAT) 0.4 MG SL tablet Place 1 tablet (0.4 mg total) under  the tongue every 5 (five) minutes as needed for chest pain. Patient not taking: Reported on 11/15/2019 05/13/18   Jerline Pain, MD  phenazopyridine (PYRIDIUM) 100 MG tablet Take 1 tablet (100 mg total) by mouth 3 (three) times daily as needed for pain. 12/04/19   AvegnoDarrelyn Hillock, FNP    Family History Family History  Problem Relation Age of Onset  . Ovarian cancer Mother 19  . Cancer Father 64       unsure of which kind, "it was in his glands"  . Hypertension Maternal Grandmother   . Stroke Maternal Grandfather   . Hypertension Son   . Brain cancer Sister   . Hypertension Daughter   . Heart attack Neg Hx   . Colon cancer Neg Hx   . Esophageal cancer Neg Hx   . Inflammatory bowel disease Neg Hx   . Liver disease Neg Hx   . Pancreatic cancer Neg Hx   . Rectal cancer Neg Hx   . Stomach cancer Neg Hx     Social History Social History   Tobacco Use  . Smoking status: Former Smoker    Packs/day: 1.50    Years: 42.00    Pack years: 63.00    Types: Cigarettes    Quit date: 05/19/1999    Years since quitting: 20.5  . Smokeless tobacco: Never Used  Substance Use Topics  . Alcohol use: No  . Drug use: No     Allergies   Shrimp [shellfish allergy]   Review of Systems Review of Systems  Constitutional: Negative.   Respiratory: Negative.   Cardiovascular: Negative.   Gastrointestinal: Positive for abdominal pain.  Genitourinary: Positive for dysuria.  ROS: All other are negatives   Physical Exam Triage Vital Signs ED Triage Vitals  Enc Vitals Group     BP 12/04/19 1343 (!) 150/82     Pulse Rate 12/04/19 1343 97     Resp 12/04/19 1343 18     Temp 12/04/19 1343 98.2 F (36.8 C)     Temp src --      SpO2 12/04/19 1343 94 %     Weight --      Height --      Head Circumference --      Peak Flow --      Pain Score 12/04/19 1342 3     Pain Loc --      Pain Edu? --  Excl. in GC? --    No data found.  Updated Vital Signs BP (!) 150/82   Pulse 97    Temp 98.2 F (36.8 C)   Resp 18   SpO2 94%   Visual Acuity Right Eye Distance:   Left Eye Distance:   Bilateral Distance:    Right Eye Near:   Left Eye Near:    Bilateral Near:     Physical Exam Vitals and nursing note reviewed.  Constitutional:      General: She is not in acute distress.    Appearance: She is normal weight. She is not ill-appearing, toxic-appearing or diaphoretic.  Cardiovascular:     Rate and Rhythm: Normal rate and regular rhythm.     Pulses: Normal pulses.     Heart sounds: Normal heart sounds. No murmur. No gallop.   Pulmonary:     Effort: No respiratory distress.     Breath sounds: No wheezing or rhonchi.  Chest:     Chest wall: No tenderness.  Abdominal:     General: Abdomen is flat. Bowel sounds are normal. There is no distension.     Palpations: Abdomen is soft. There is no mass.     Tenderness: There is no abdominal tenderness. There is no right CVA tenderness, left CVA tenderness, guarding or rebound.     Hernia: No hernia is present.  Genitourinary:    Vagina: No vaginal discharge.  Neurological:     Mental Status: She is alert.      UC Treatments / Results  Labs (all labs ordered are listed, but only abnormal results are displayed) Labs Reviewed  POCT URINALYSIS DIP (MANUAL ENTRY) - Abnormal; Notable for the following components:      Result Value   Color, UA brown (*)    Clarity, UA cloudy (*)    Bilirubin, UA small (*)    Ketones, POC UA trace (5) (*)    Blood, UA large (*)    Protein Ur, POC =100 (*)    All other components within normal limits  URINE CULTURE    EKG   Radiology No results found.  Procedures Procedures (including critical care time)  Medications Ordered in UC Medications - No data to display  Initial Impression / Assessment and Plan / UC Course  I have reviewed the triage vital signs and the nursing notes.  Pertinent labs & imaging results that were available during my care of the patient were  reviewed by me and considered in my medical decision making (see chart for details).   Point-of-care urinalysis test was ordered and result was reviewed.  Results show cloudy urine, with large red blood cell and protein.  Urine will be sent for culture.  Advised patient to increase fluid intake and to take medication to completion.  To return for worsening of symptoms.  Patient verbalized understanding of the plan of care   Final Clinical Impressions(s) / UC Diagnoses   Final diagnoses:  Dysuria  Bilateral lower abdominal pain     Discharge Instructions     Point-of-care urine show cloudy urine with large red blood cell and protein. Urine culture sent.  We will call you with the results.   Push fluids and get plenty of rest.   Take antibiotic as directed and to completion Take pyridium as prescribed and as needed for symptomatic relief Follow up with PCP if symptoms persists Return here or go to ER if you have any new or worsening symptoms such as fever, worsening  abdominal pain, nausea/vomiting, flank pain     ED Prescriptions    Medication Sig Dispense Auth. Provider   phenazopyridine (PYRIDIUM) 100 MG tablet Take 1 tablet (100 mg total) by mouth 3 (three) times daily as needed for pain. 10 tablet Endy Easterly, Darrelyn Hillock, FNP     PDMP not reviewed this encounter.   Emerson Monte, FNP 12/04/19 1424

## 2019-12-04 NOTE — ED Triage Notes (Signed)
Pt presents with c/o left sided abdominal pain and dysuria that began yesterday

## 2019-12-05 ENCOUNTER — Other Ambulatory Visit (HOSPITAL_COMMUNITY): Payer: Self-pay | Admitting: Hematology

## 2019-12-05 ENCOUNTER — Ambulatory Visit (HOSPITAL_COMMUNITY)
Admission: RE | Admit: 2019-12-05 | Discharge: 2019-12-05 | Disposition: A | Discharge status: Home / Self Care | Payer: Medicare Other | Source: Ambulatory Visit | Attending: Hematology | Admitting: Hematology

## 2019-12-05 ENCOUNTER — Other Ambulatory Visit: Payer: Self-pay

## 2019-12-05 DIAGNOSIS — R928 Other abnormal and inconclusive findings on diagnostic imaging of breast: Secondary | ICD-10-CM | POA: Diagnosis not present

## 2019-12-05 MED ORDER — LIDOCAINE HCL (PF) 2 % IJ SOLN
INTRAMUSCULAR | Status: AC
  Filled 2019-12-05: qty 10

## 2019-12-05 MED ORDER — LIDOCAINE HCL (PF) 1 % IJ SOLN
INTRAMUSCULAR | Status: AC
  Filled 2019-12-05: qty 10

## 2019-12-06 ENCOUNTER — Other Ambulatory Visit: Payer: Self-pay

## 2019-12-06 ENCOUNTER — Encounter (HOSPITAL_COMMUNITY): Payer: Self-pay | Admitting: Hematology

## 2019-12-06 ENCOUNTER — Inpatient Hospital Stay (HOSPITAL_COMMUNITY): Payer: Medicare Other | Attending: Nurse Practitioner | Admitting: Hematology

## 2019-12-06 DIAGNOSIS — Z9049 Acquired absence of other specified parts of digestive tract: Secondary | ICD-10-CM | POA: Diagnosis not present

## 2019-12-06 DIAGNOSIS — Z79899 Other long term (current) drug therapy: Secondary | ICD-10-CM | POA: Diagnosis not present

## 2019-12-06 DIAGNOSIS — E039 Hypothyroidism, unspecified: Secondary | ICD-10-CM | POA: Diagnosis not present

## 2019-12-06 DIAGNOSIS — M1990 Unspecified osteoarthritis, unspecified site: Secondary | ICD-10-CM | POA: Diagnosis not present

## 2019-12-06 DIAGNOSIS — I10 Essential (primary) hypertension: Secondary | ICD-10-CM | POA: Insufficient documentation

## 2019-12-06 DIAGNOSIS — C182 Malignant neoplasm of ascending colon: Secondary | ICD-10-CM | POA: Insufficient documentation

## 2019-12-06 DIAGNOSIS — Z951 Presence of aortocoronary bypass graft: Secondary | ICD-10-CM | POA: Insufficient documentation

## 2019-12-06 DIAGNOSIS — D509 Iron deficiency anemia, unspecified: Secondary | ICD-10-CM | POA: Insufficient documentation

## 2019-12-06 DIAGNOSIS — Z87891 Personal history of nicotine dependence: Secondary | ICD-10-CM | POA: Diagnosis not present

## 2019-12-06 DIAGNOSIS — K219 Gastro-esophageal reflux disease without esophagitis: Secondary | ICD-10-CM | POA: Diagnosis not present

## 2019-12-06 DIAGNOSIS — I739 Peripheral vascular disease, unspecified: Secondary | ICD-10-CM | POA: Insufficient documentation

## 2019-12-06 DIAGNOSIS — I2510 Atherosclerotic heart disease of native coronary artery without angina pectoris: Secondary | ICD-10-CM | POA: Insufficient documentation

## 2019-12-06 DIAGNOSIS — Z85828 Personal history of other malignant neoplasm of skin: Secondary | ICD-10-CM | POA: Diagnosis not present

## 2019-12-06 DIAGNOSIS — Z7982 Long term (current) use of aspirin: Secondary | ICD-10-CM | POA: Diagnosis not present

## 2019-12-06 DIAGNOSIS — I252 Old myocardial infarction: Secondary | ICD-10-CM | POA: Diagnosis not present

## 2019-12-06 DIAGNOSIS — E7800 Pure hypercholesterolemia, unspecified: Secondary | ICD-10-CM | POA: Insufficient documentation

## 2019-12-06 LAB — URINE CULTURE: Culture: NO GROWTH

## 2019-12-06 NOTE — Progress Notes (Signed)
Jessica Taylor, [place] [id number]   CLINIC:  Medical Oncology/Hematology  PCP:  Jessica Contras, MD Jessica Taylor [id number] [phone]   REASON FOR VISIT:  Follow-up for ascending colon cancer.  CURRENT THERAPY: Observation.  BRIEF ONCOLOGIC HISTORY:  Oncology History   No history exists.     CANCER STAGING: Cancer Staging Cancer of ascending colon s/p robotic proximal colectomy 10/11/2019 Staging form: Colon and Rectum, AJCC 8th Edition - Clinical stage from 10/23/2019: Stage IIIB (cT3, cN1c, cM0) - Unsigned    INTERVAL HISTORY:  [name] 79 y.o. female seen along with her daughter for follow-up of right axillary lymph node mass.  She had mammograms done on 11/28/2019 followed by right axillary node biopsy on 12/05/2019.  Reports appetite of 100% and energy levels of 75%.  Mild soreness at surgical site is improving.    REVIEW OF SYSTEMS:  Review of Systems  All other systems reviewed and are negative.    PAST MEDICAL/SURGICAL HISTORY:  Past Medical History:  Diagnosis Date  . Abdominal aortic aneurysm ([facility])   . Acid reflux   . Anemia   . Arthritis    knees , R shoulder - tx /w injection - 11/2014  . Basal cell carcinoma (BCC) of dorsum of nose 2010   Resolved  . Cancer ([facility])    basal cell on nose  . Colon cancer ([facility])   . Coronary artery disease   . Hypercholesteremia   . Hypertension   . Hypothyroidism   . Lt Acute pyelonephritis 09/11/2018  . MI, old 61  . Peripheral arterial disease ([facility])    hhigh-grade ostial bilateral calcified iliac stenosis with claudication  . Tobacco abuse   . Vertigo    when lays on left side.   Past Surgical History:  Procedure Laterality Date  . CARDIAC CATHETERIZATION  5 stents  . CARDIAC CATHETERIZATION N/A 01/20/2016   Procedure: Left Heart Cath and Coronary Angiography;  Surgeon: Jessica M Martinique, MD;  Location: [facility];  Service:  Cardiovascular;  Laterality: N/A;  . CATARACT EXTRACTION W/PHACO Left 05/24/2014   Procedure: CATARACT EXTRACTION PHACO AND INTRAOCULAR LENS PLACEMENT (IOC);  Surgeon: Jessica Branch, MD;  Location: [facility];  Service: Ophthalmology;  Laterality: Left;  CDE:  9.30  . CATARACT EXTRACTION W/PHACO Right 06/18/2014   Procedure: CATARACT EXTRACTION PHACO AND INTRAOCULAR LENS PLACEMENT RIGHT EYE CDE=10.84;  Surgeon: Jessica Branch, MD;  Location: [facility];  Service: Ophthalmology;  Laterality: Right;  . CORONARY ARTERY BYPASS GRAFT N/A 01/24/2016   Procedure: CORONARY ARTERY BYPASS GRAFTING (CABG);  Surgeon: Jessica Pollack, MD;  Location: [facility];  Service: Open Heart Surgery;  Laterality: N/A;  . CORONARY STENT PLACEMENT    . CORONARY STENT PLACEMENT  03/06/15   CFX DES  . CYSTOSCOPY/URETEROSCOPY/HOLMIUM LASER/STENT PLACEMENT Left 09/12/2018   Procedure: CYSTOSCOPY/URETEROSCOPY/STENT PLACEMENT;  Surgeon: Jessica Mons, MD;  Location: [facility];  Service: Urology;  Laterality: Left;  . EYE SURGERY    . LEFT HEART CATH AND CORS/GRAFTS ANGIOGRAPHY N/A 09/12/2019   Procedure: LEFT HEART CATH AND CORS/GRAFTS ANGIOGRAPHY;  Surgeon: Jessica Sine, MD;  Location: [facility];  Service: Cardiovascular;  Laterality: N/A;  . LEFT HEART CATHETERIZATION WITH CORONARY ANGIOGRAM N/A 03/06/2015   Procedure: LEFT HEART CATHETERIZATION WITH CORONARY ANGIOGRAM;  Surgeon: Jessica Harp, MD;  Location: [facility];  Service: Cardiovascular;  Laterality: N/A;  . PARTIAL KNEE ARTHROPLASTY Right 02/04/2015  Procedure: UNICOMPARTMENTAL KNEE;  Surgeon: Jessica Leitz, MD;  Location: [facility];  Service: Orthopedics;  Laterality: Right;  . PERIPHERAL VASCULAR CATHETERIZATION Bilateral 05/13/2015   Procedure: Lower Extremity Angiography;  Surgeon: Jessica Harp, MD;  Location: [facility];  Service: Cardiovascular;  Laterality: Bilateral;  . PERIPHERAL VASCULAR CATHETERIZATION N/A 05/13/2015   Procedure: Abdominal Aortogram;   Surgeon: Jessica Harp, MD;  Location: [facility];  Service: Cardiovascular;  Laterality: N/A;  . PERIPHERAL VASCULAR CATHETERIZATION Bilateral 06/27/2015   Procedure: Peripheral Vascular Intervention;  Surgeon: Jessica Harp, MD;  Location: [facility];  Service: Cardiovascular;  Laterality: Bilateral;  ILIACS  . PERIPHERAL VASCULAR CATHETERIZATION Bilateral 06/27/2015   Procedure: Peripheral Vascular Atherectomy;  Surgeon: Jessica Harp, MD;  Location: [facility];  Service: Cardiovascular;  Laterality: Bilateral;  . TEE WITHOUT CARDIOVERSION N/A 01/24/2016   Procedure: TRANSESOPHAGEAL ECHOCARDIOGRAM (TEE);  Surgeon: Jessica Pollack, MD;  Location: [facility];  Service: Open Heart Surgery;  Laterality: N/A;  . TONSILLECTOMY     age 71  . TUBAL LIGATION       SOCIAL HISTORY:  Social History   Socioeconomic History  . Marital status: Married    Spouse name: Jessica [facility]  . Number of children: 4  . Years of education: Not on file  . Highest education level: Not on file  Occupational History  . Occupation: retired    Comment: worked as Presenter, broadcasting for [employer]  Tobacco Use  . Smoking status: Former Smoker    Packs/day: 1.50    Years: 42.00    Pack years: 63.00    Types: Cigarettes    Quit date: 05/19/1999    Years since quitting: 20.5  . Smokeless tobacco: Never Used  Substance and Sexual Activity  . Alcohol use: No  . Drug use: No  . Sexual activity: Yes    Birth control/protection: Surgical  Other Topics Concern  . Not on file  Social History Narrative  . Not on file   Social Determinants of Health   Financial Resource Strain:   . Difficulty of Paying Living Expenses: Not on file  Food Insecurity:   . Worried About Charity fundraiser in the Last Year: Not on file  . Ran Out of Food in the Last Year: Not on file  Transportation Needs:   . Lack of Transportation (Medical): Not on file  . Lack of Transportation (Non-Medical): Not on file    Physical Activity:   . Days of Exercise per Week: Not on file  . Minutes of Exercise per Session: Not on file  Stress:   . Feeling of Stress : Not on file  Social Connections:   . Frequency of Communication with Friends and Family: Not on file  . Frequency of Social Gatherings with Friends and Family: Not on file  . Attends Religious Services: Not on file  . Active Member of Clubs or Organizations: Not on file  . Attends Archivist Meetings: Not on file  . Marital Status: Not on file  Intimate Partner Violence:   . Fear of Current or Ex-Partner: Not on file  . Emotionally Abused: Not on file  . Physically Abused: Not on file  . Sexually Abused: Not on file    FAMILY HISTORY:  Family History  Problem Relation Age of Onset  . Ovarian cancer Mother 59  . Cancer Father 99       unsure of which kind, "it was in  his glands"  . Hypertension Maternal Grandmother   . Stroke Maternal Grandfather   . Hypertension Son   . Brain cancer Sister   . Hypertension Daughter   . Heart attack Neg Hx   . Colon cancer Neg Hx   . Esophageal cancer Neg Hx   . Inflammatory bowel disease Neg Hx   . Liver disease Neg Hx   . Pancreatic cancer Neg Hx   . Rectal cancer Neg Hx   . Stomach cancer Neg Hx     CURRENT MEDICATIONS:  Outpatient Encounter Medications as of 12/06/2019  Medication Sig  . aspirin EC 81 MG tablet Take 81 mg by mouth every evening.   Marland Kitchen atorvastatin (LIPITOR) 40 MG tablet TAKE (1) TABLET BY MOUTH ONCE DAILY.  Marland Kitchen clopidogrel (PLAVIX) 75 MG tablet TAKE (1) TABLET BY MOUTH ONCE DAILY. (Patient taking differently: Take 75 mg by mouth daily. )  . esomeprazole (NEXIUM) 20 MG capsule Take 20 mg by mouth daily.   Marland Kitchen levothyroxine (SYNTHROID, LEVOTHROID) 50 MCG tablet Take 50 mcg by mouth daily before breakfast.  . metoprolol tartrate (LOPRESSOR) 25 MG tablet Take 1 tablet (25 mg total) by mouth 2 (two) times daily.  Marland Kitchen gabapentin (NEURONTIN) 100 MG capsule Take 200 mg by mouth  every 8 (eight) hours as needed.  Marland Kitchen HYDROcodone-acetaminophen (NORCO/VICODIN) 5-325 MG tablet Take 1 tablet by mouth every 6 (six) hours as needed for moderate pain or severe pain. (Patient not taking: Reported on 11/15/2019)  . meclizine (ANTIVERT) 12.5 MG tablet Take 12.5 mg by mouth every 8 (eight) hours as needed.  . nitroGLYCERIN (NITROSTAT) 0.4 MG SL tablet Place 1 tablet (0.4 mg total) under the tongue every 5 (five) minutes as needed for chest pain. (Patient not taking: Reported on 11/15/2019)  . [DISCONTINUED] phenazopyridine (PYRIDIUM) 100 MG tablet Take 1 tablet (100 mg total) by mouth 3 (three) times daily as needed for pain.   No facility-administered encounter medications on file as of 12/06/2019.    ALLERGIES:  Allergies  Allergen Reactions  . Shrimp [Shellfish Allergy] Nausea And Vomiting    Throws up violently     PHYSICAL EXAM:  ECOG Performance status: 1  Vitals:   12/06/19 1457  BP: (!) 154/70  Pulse: (!) 51  Resp: 18  Temp: (!) 97.3 F (36.3 C)  SpO2: 97%   Filed Weights   12/06/19 1457  Weight: 148 lb (67.1 kg)    Physical Exam Vitals reviewed.  Constitutional:      Appearance: Normal appearance.  Cardiovascular:     Rate and Rhythm: Normal rate and regular rhythm.     Heart sounds: Normal heart sounds.  Pulmonary:     Effort: Pulmonary effort is normal.     Breath sounds: Normal breath sounds.  Abdominal:     General: There is no distension.     Palpations: Abdomen is soft. There is no mass.  Lymphadenopathy:     Cervical: No cervical adenopathy.  Skin:    General: Skin is warm.     Findings: Bruising present.  Neurological:     General: No focal deficit present.     Mental Status: She is alert and oriented to person, place, and time.  Psychiatric:        Mood and Affect: Mood normal.        Behavior: Behavior normal.      LABORATORY DATA:  I have reviewed the labs as listed.  CBC    Component Value Date/Time   WBC  5.5  11/22/2019 1353   RBC 4.69 11/22/2019 1353   HGB 13.0 11/22/2019 1353   HGB 11.0 (L) 09/08/2019 1403   HCT 43.5 11/22/2019 1353   HCT 34.8 09/08/2019 1403   PLT 215 11/22/2019 1353   PLT 264 09/08/2019 1403   MCV 92.8 11/22/2019 1353   MCV 81 09/08/2019 1403   MCH 27.7 11/22/2019 1353   MCHC 29.9 (L) 11/22/2019 1353   RDW 18.1 (H) 11/22/2019 1353   RDW 20.8 (H) 09/08/2019 1403   LYMPHSABS 1.1 11/22/2019 1353   MONOABS 0.3 11/22/2019 1353   EOSABS 0.0 11/22/2019 1353   BASOSABS 0.1 11/22/2019 1353   CMP Latest Ref Rng & Units 10/12/2019 10/10/2019 09/08/2019  Glucose 70 - 99 mg/dL 135(H) 114(H) 93  BUN 8 - 23 mg/dL 16 16 19   Creatinine 0.44 - 1.00 mg/dL 1.38(H) 1.42(H) 1.37(H)  Sodium 135 - 145 mmol/L 135 141 140  Potassium 3.5 - 5.1 mmol/L 4.4 4.0 4.9  Chloride 98 - 111 mmol/L 103 105 102  CO2 22 - 32 mmol/L 23 27 25   Calcium 8.9 - 10.3 mg/dL 8.8(L) 9.8 9.8  Total Protein 6.0 - 8.3 g/dL - - -  Total Bilirubin 0.2 - 1.2 mg/dL - - -  Alkaline Phos 39 - 117 U/L - - -  AST 0 - 37 U/L - - -  ALT 0 - 35 U/L - - -       DIAGNOSTIC IMAGING:  I have independently reviewed the scans and discussed with the patient.    ASSESSMENT & PLAN:   Cancer of ascending colon s/p robotic proximal colectomy 10/11/2019 1.  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 -Colonoscopy on 08/03/2019 with right colon mass biopsy consistent with adenocarcinoma. -CT scan of the CAP on 08/08/2019 showed approximately 4.8 cm circumferential mass in the ascending colon with local tumor extension medially along the colon mesentery with paracolic tumor measuring up to 3.5 cm, adjacent small paracolic lymph nodes adjacent to the tumor.  Tiny hypodense lesion in the right hepatic lobe, technically too small to characterize.  Segment 4 small liver lesion appears to be stable cyst. -Preoperative CEA on  08/09/2019 was elevated at 39.5. -She underwent right hemicolectomy on 10/11/2019, grade 3 adenocarcinoma, negative margins, positive LVSI, 2 tumor deposits, 0/15 lymph nodes positive, PT3PN1C. 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  MSI is high.  BRAF V600E+.  MLH1 hyper methylation present. -We reviewed recent PET scan which did not show any metastatic disease.  There is a lymph node in the right axillary region which is not clinically palpable. -I have ordered a mammogram on 11/28/2019 which showed 1.2 cm hypoechoic mass in the medial right axilla. -Ultrasound-guided biopsy of the mass/node in the right axillary tail on 12/05/2019.  Results are pending. -She denies any prior breast biopsies.  No family history of breast cancer. -I reviewed results of CEA which is improved to 10.7 on 11/22/2019.  This was 15.4 on 10/23/2019.  We will consider following it up in the future. -I will set her up for phone visit next week to discuss biopsy results.  2.  Normocytic anemia: -Ferritin was 47 and percent saturation was 11.  She received Feraheme on 10/27/2019 and 01/03/2019.     Orders placed this encounter:  No orders of the defined types were placed in this encounter.     Derek Jack, MD [place] [phone]

## 2019-12-06 NOTE — Assessment & Plan Note (Addendum)
1.  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 -Colonoscopy on 08/03/2019 with right colon mass biopsy consistent with adenocarcinoma. -CT scan of the CAP on 08/08/2019 showed approximately 4.8 cm circumferential mass in the ascending colon with local tumor extension medially along the colon mesentery with paracolic tumor measuring up to 3.5 cm, adjacent small paracolic lymph nodes adjacent to the tumor.  Tiny hypodense lesion in the right hepatic lobe, technically too small to characterize.  Segment 4 small liver lesion appears to be stable cyst. -Preoperative CEA on 08/09/2019 was elevated at 39.5. -She underwent right hemicolectomy on 10/11/2019, grade 3 adenocarcinoma, negative margins, positive LVSI, 2 tumor deposits, 0/15 lymph nodes positive, PT3PN1C. 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  MSI is high.  BRAF V600E+.  MLH1 hyper methylation present. -We reviewed recent PET scan which did not show any metastatic disease.  There is a lymph node in the right axillary region which is not clinically palpable. -I have ordered a mammogram on 11/28/2019 which showed 1.2 cm hypoechoic mass in the medial right axilla. -Ultrasound-guided biopsy of the mass/node in the right axillary tail on 12/05/2019.  Results are pending. -She denies any prior breast biopsies.  No family history of breast cancer. -I reviewed results of CEA which is improved to 10.7 on 11/22/2019.  This was 15.4 on 10/23/2019.  We will consider following it up in the future. -I will set her up for phone visit next week to discuss biopsy results.  2.  Normocytic anemia: -Ferritin was 47 and percent saturation was 11.  She received Feraheme on 10/27/2019 and 01/03/2019.

## 2019-12-06 NOTE — Patient Instructions (Addendum)
[place] at [facility] Discharge Instructions  You were seen today by Dr. Delton Coombes. He went over your recent lab results. Depending on what your biopsy results show will determine how often you should return with scans. He will follow up by phone next week to review your biopsy results.   Thank you for choosing [place] at [facility] to provide your oncology and hematology care.  To afford each patient quality time with our provider, please arrive at least 15 minutes before your scheduled appointment time.   If you have a lab appointment with the [facility] please come in thru the  Main Entrance and check in at the main information desk  You need to re-schedule your appointment should you arrive 10 or more minutes late.  We strive to give you quality time with our providers, and arriving late affects you and other patients whose appointments are after yours.  Also, if you no show three or more times for appointments you may be dismissed from the clinic at the providers discretion.     Again, thank you for choosing [facility].  Our hope is that these requests will decrease the amount of time that you wait before being seen by our physicians.       _____________________________________________________________  Should you have questions after your visit to [facility], please contact our office at [phone] between the hours of 8:00 a.m. and 4:30 p.m.  Voicemails left after 4:00 p.m. will not be returned until the following business day.  For prescription refill requests, have your pharmacy contact our office and allow 72 hours.    Cancer Center Support Programs:   > Cancer Support Group  2nd Tuesday of the month 1pm-2pm, Journey Room

## 2019-12-07 LAB — SURGICAL PATHOLOGY

## 2019-12-13 ENCOUNTER — Telehealth (HOSPITAL_COMMUNITY): Payer: Medicare Other | Admitting: Hematology

## 2019-12-14 ENCOUNTER — Inpatient Hospital Stay (HOSPITAL_BASED_OUTPATIENT_CLINIC_OR_DEPARTMENT_OTHER): Payer: Medicare Other | Admitting: Hematology

## 2019-12-14 ENCOUNTER — Other Ambulatory Visit: Payer: Self-pay

## 2019-12-14 ENCOUNTER — Encounter (HOSPITAL_COMMUNITY): Payer: Self-pay | Admitting: Hematology

## 2019-12-14 DIAGNOSIS — C182 Malignant neoplasm of ascending colon: Secondary | ICD-10-CM

## 2019-12-14 NOTE — Progress Notes (Signed)
Virtual Visit via Telephone Note  I connected with Jessica Taylor on 12/14/19 at 11:00 AM EST by telephone and verified that I am speaking with the correct person using two identifiers.   I discussed the limitations, risks, security and privacy concerns of performing an evaluation and management service by telephone and the availability of in person appointments. I also discussed with the patient that there may be a patient responsible charge related to this service. The patient expressed understanding and agreed to proceed.   History of Present Illness: She is seen for newly diagnosed stage IIIb ascending colon adenocarcinoma.  She was also treated for normocytic anemia in our clinic with parenteral iron.   Observations/Objective: She denies any new onset pains.  She had biopsy of the lymph node done.  Appetite is 75%.  Energy levels are 50%.  No new pains reported.  Occasional sharp pains in the abdomen present.  Assessment and Plan:  1.  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  MSI is high.  BRAF V6 100 E+.  MLH1 hyper methylation present. -PET scan showed single lymph node in the right axillary region which is hypermetabolic and not clinically palpable.  No other metastatic site. -Last CEA on 11/22/2019 was 10.7. -Biopsy of the right axillary lymph node on 12/05/2019 consistent with metastatic carcinoma, primary colorectal.  Tumor cells are positive for CDX2, and CK20 negative for CK7. -I have discussed the results of the biopsy with the patient. -I will reach out to Dr. Johney Maine and discuss about resection of this lymph node, as it is the only site of metastatic disease. -If she needs systemic therapy, she would be a great candidate for immunotherapy given MSI-high  status.  I will see her back in 2 weeks to discuss.  2.  Normocytic anemia: -CBC on 11/22/2019 shows hemoglobin 13.  Ferritin is 230 and percent saturation is 31. -She received Feraheme on 10/27/2019 and 01/03/2019. -Does not require any parenteral iron therapy at this time.   Follow Up Instructions: RTC 2 weeks.   I discussed the assessment and treatment plan with the patient. The patient was provided an opportunity to ask questions and all were answered. The patient agreed with the plan and demonstrated an understanding of the instructions.   The patient was advised to call back or seek an in-person evaluation if the symptoms worsen or if the condition fails to improve as anticipated.  I provided 12 minutes of non-face-to-face time during this encounter.   Derek Jack, MD

## 2019-12-18 ENCOUNTER — Other Ambulatory Visit (HOSPITAL_COMMUNITY): Payer: Self-pay | Admitting: *Deleted

## 2019-12-18 DIAGNOSIS — C182 Malignant neoplasm of ascending colon: Secondary | ICD-10-CM

## 2019-12-18 NOTE — Progress Notes (Signed)
Per Dr. Delton Coombes, MRI breast ordered.  Orders placed and patient is aware.

## 2019-12-25 ENCOUNTER — Other Ambulatory Visit: Payer: Self-pay

## 2019-12-25 ENCOUNTER — Ambulatory Visit (HOSPITAL_COMMUNITY)
Admission: RE | Admit: 2019-12-25 | Discharge: 2019-12-25 | Disposition: A | Discharge status: Home / Self Care | Payer: Medicare Other | Source: Ambulatory Visit | Attending: Hematology | Admitting: Hematology

## 2019-12-25 ENCOUNTER — Other Ambulatory Visit (HOSPITAL_COMMUNITY): Payer: Self-pay | Admitting: *Deleted

## 2019-12-25 DIAGNOSIS — C182 Malignant neoplasm of ascending colon: Secondary | ICD-10-CM | POA: Diagnosis not present

## 2019-12-25 LAB — POCT I-STAT CREATININE: Creatinine, Ser: 1.3 mg/dL — ABNORMAL HIGH (ref 0.44–1.00)

## 2019-12-25 MED ORDER — GADOBUTROL 1 MMOL/ML IV SOLN
7.5000 mL | Freq: Once | INTRAVENOUS | Status: AC | PRN
  Administered 2019-12-25: 7.5 mL via INTRAVENOUS

## 2019-12-26 ENCOUNTER — Other Ambulatory Visit (HOSPITAL_COMMUNITY): Payer: Self-pay | Admitting: *Deleted

## 2019-12-26 ENCOUNTER — Other Ambulatory Visit: Payer: Self-pay | Admitting: Hematology

## 2019-12-26 ENCOUNTER — Encounter (HOSPITAL_COMMUNITY): Payer: Self-pay | Admitting: *Deleted

## 2019-12-26 DIAGNOSIS — R9389 Abnormal findings on diagnostic imaging of other specified body structures: Secondary | ICD-10-CM

## 2019-12-26 NOTE — Progress Notes (Signed)
Per Dr. Delton Coombes, patient is have bilateral breast MRI guided biopsy.  [facility] have contacted [facility] and scheduled patient for this. 2/9 @ 0800 for 0850 appointment time at the [place] location.  I have left patient a VM to return call to our clinic to review her appts.

## 2019-12-26 NOTE — Progress Notes (Signed)
Patient returned call to the clinic and is aware of her biopsy appointment next week.  She was given the number to [facility] for reference if she needs it.

## 2020-01-01 ENCOUNTER — Ambulatory Visit (HOSPITAL_COMMUNITY): Payer: Medicare Other | Admitting: Hematology

## 2020-01-02 ENCOUNTER — Ambulatory Visit
Admission: RE | Admit: 2020-01-02 | Discharge: 2020-01-02 | Disposition: A | Discharge status: Home / Self Care | Payer: Medicare Other | Source: Ambulatory Visit | Attending: Hematology | Admitting: Hematology

## 2020-01-02 ENCOUNTER — Other Ambulatory Visit: Payer: Self-pay

## 2020-01-02 ENCOUNTER — Other Ambulatory Visit: Payer: Self-pay | Admitting: Diagnostic Radiology

## 2020-01-02 DIAGNOSIS — R9389 Abnormal findings on diagnostic imaging of other specified body structures: Secondary | ICD-10-CM

## 2020-01-02 MED ORDER — GADOBUTROL 1 MMOL/ML IV SOLN
8.0000 mL | Freq: Once | INTRAVENOUS | Status: AC | PRN
  Administered 2020-01-02: 8 mL via INTRAVENOUS

## 2020-01-03 ENCOUNTER — Other Ambulatory Visit (HOSPITAL_COMMUNITY): Payer: Self-pay | Admitting: Nurse Practitioner

## 2020-01-04 ENCOUNTER — Other Ambulatory Visit: Payer: Self-pay

## 2020-01-04 ENCOUNTER — Inpatient Hospital Stay (HOSPITAL_COMMUNITY): Payer: Medicare Other | Attending: Nurse Practitioner | Admitting: Hematology

## 2020-01-04 ENCOUNTER — Encounter (HOSPITAL_COMMUNITY): Payer: Self-pay | Admitting: Hematology

## 2020-01-04 DIAGNOSIS — Z87891 Personal history of nicotine dependence: Secondary | ICD-10-CM | POA: Insufficient documentation

## 2020-01-04 DIAGNOSIS — N63 Unspecified lump in unspecified breast: Secondary | ICD-10-CM | POA: Insufficient documentation

## 2020-01-04 DIAGNOSIS — Z8249 Family history of ischemic heart disease and other diseases of the circulatory system: Secondary | ICD-10-CM | POA: Insufficient documentation

## 2020-01-04 DIAGNOSIS — E039 Hypothyroidism, unspecified: Secondary | ICD-10-CM | POA: Diagnosis not present

## 2020-01-04 DIAGNOSIS — Z809 Family history of malignant neoplasm, unspecified: Secondary | ICD-10-CM | POA: Diagnosis not present

## 2020-01-04 DIAGNOSIS — Z79899 Other long term (current) drug therapy: Secondary | ICD-10-CM | POA: Diagnosis not present

## 2020-01-04 DIAGNOSIS — Z8041 Family history of malignant neoplasm of ovary: Secondary | ICD-10-CM | POA: Diagnosis not present

## 2020-01-04 DIAGNOSIS — D649 Anemia, unspecified: Secondary | ICD-10-CM | POA: Diagnosis not present

## 2020-01-04 DIAGNOSIS — I10 Essential (primary) hypertension: Secondary | ICD-10-CM | POA: Insufficient documentation

## 2020-01-04 DIAGNOSIS — Z7982 Long term (current) use of aspirin: Secondary | ICD-10-CM | POA: Diagnosis not present

## 2020-01-04 DIAGNOSIS — C182 Malignant neoplasm of ascending colon: Secondary | ICD-10-CM

## 2020-01-04 DIAGNOSIS — Z9049 Acquired absence of other specified parts of digestive tract: Secondary | ICD-10-CM | POA: Insufficient documentation

## 2020-01-04 DIAGNOSIS — Z823 Family history of stroke: Secondary | ICD-10-CM | POA: Insufficient documentation

## 2020-01-04 NOTE — Progress Notes (Signed)
[place] [place], [place] [address]   CLINIC:  Medical Oncology/Hematology  PCP:  Antony Contras, MD [place] [id number] [phone]   REASON FOR VISIT:  Follow-up for ascending colon cancer.  CURRENT THERAPY: Work-up for breast mass.  BRIEF ONCOLOGIC HISTORY:  Oncology History  Cancer of ascending colon s/p robotic proximal colectomy 10/11/2019  10/11/2019 Initial Diagnosis   Cancer of ascending colon s/p robotic proximal colectomy 10/11/2019   10/23/2019 Cancer Staging   Staging form: Colon and Rectum, AJCC 8th Edition - Clinical stage from 10/23/2019: Stage IVA (cT3, cN1c, cM1a) - Signed by Derek Jack, MD on 12/14/2019      CANCER STAGING: Cancer Staging Cancer of ascending colon s/p robotic proximal colectomy 10/11/2019 Staging form: Colon and Rectum, AJCC 8th Edition - Clinical stage from 10/23/2019: Stage IVA (cT3, cN1c, cM1a) - Signed by Derek Jack, MD on 12/14/2019    INTERVAL HISTORY:  Jessica Taylor 79 y.o. female seen for follow-up along with her daughter.  She had MRI of the breast done on 12/25/2019.  This was followed by bilateral breast biopsies on 01/02/2020.  Denies any new onset pains.  Appetite is not percent.  Energy levels are 75%.  Denies any bleeding per rectum or melena.  No pains reported.  She is able to do all her day-to-day activities.    REVIEW OF SYSTEMS:  Review of Systems  All other systems reviewed and are negative.    PAST MEDICAL/SURGICAL HISTORY:  Past Medical History:  Diagnosis Date  . Abdominal aortic aneurysm ([facility])   . Acid reflux   . Anemia   . Arthritis    knees , R shoulder - tx /w injection - 11/2014  . Basal cell carcinoma (BCC) of dorsum of nose 2010   Resolved  . Cancer ([facility])    basal cell on nose  . Colon cancer ([facility])   . Coronary artery disease   . Hypercholesteremia   . Hypertension   . Hypothyroidism   . Lt Acute pyelonephritis  09/11/2018  . MI, old 52  . Peripheral arterial disease ([facility])    hhigh-grade ostial bilateral calcified iliac stenosis with claudication  . Tobacco abuse   . Vertigo    when lays on left side.   Past Surgical History:  Procedure Laterality Date  . CARDIAC CATHETERIZATION  5 stents  . CARDIAC CATHETERIZATION N/A 01/20/2016   Procedure: Left Heart Cath and Coronary Angiography;  Surgeon: Peter M Martinique, MD;  Location: [place] [facility];  Service: Cardiovascular;  Laterality: N/A;  . CATARACT EXTRACTION W/PHACO Left 05/24/2014   Procedure: CATARACT EXTRACTION PHACO AND INTRAOCULAR LENS PLACEMENT (IOC);  Surgeon: Tonny Branch, MD;  Location: [facility];  Service: Ophthalmology;  Laterality: Left;  CDE:  9.30  . CATARACT EXTRACTION W/PHACO Right 06/18/2014   Procedure: CATARACT EXTRACTION PHACO AND INTRAOCULAR LENS PLACEMENT RIGHT EYE CDE=10.84;  Surgeon: Tonny Branch, MD;  Location: [facility];  Service: Ophthalmology;  Laterality: Right;  . CORONARY ARTERY BYPASS GRAFT N/A 01/24/2016   Procedure: CORONARY ARTERY BYPASS GRAFTING (CABG);  Surgeon: Gaye Pollack, MD;  Location: [place];  Service: Open Heart Surgery;  Laterality: N/A;  . CORONARY STENT PLACEMENT    . CORONARY STENT PLACEMENT  03/06/15   CFX DES  . CYSTOSCOPY/URETEROSCOPY/HOLMIUM LASER/STENT PLACEMENT Left 09/12/2018   Procedure: CYSTOSCOPY/URETEROSCOPY/STENT PLACEMENT;  Surgeon: Ceasar Mons, MD;  Location: [facility];  Service: Urology;  Laterality: Left;  . EYE SURGERY    .  LEFT HEART CATH AND CORS/GRAFTS ANGIOGRAPHY N/A 09/12/2019   Procedure: LEFT HEART CATH AND CORS/GRAFTS ANGIOGRAPHY;  Surgeon: Troy Sine, MD;  Location: [place];  Service: Cardiovascular;  Laterality: N/A;  . LEFT HEART CATHETERIZATION WITH CORONARY ANGIOGRAM N/A 03/06/2015   Procedure: LEFT HEART CATHETERIZATION WITH CORONARY ANGIOGRAM;  Surgeon: Lorretta Harp, MD;  Location: [facility];  Service: Cardiovascular;  Laterality: N/A;  . PARTIAL  KNEE ARTHROPLASTY Right 02/04/2015   Procedure: UNICOMPARTMENTAL KNEE;  Surgeon: Dorna Leitz, MD;  Location: [facility];  Service: Orthopedics;  Laterality: Right;  . PERIPHERAL VASCULAR CATHETERIZATION Bilateral 05/13/2015   Procedure: Lower Extremity Angiography;  Surgeon: Lorretta Harp, MD;  Location: [place] [facility];  Service: Cardiovascular;  Laterality: Bilateral;  . PERIPHERAL VASCULAR CATHETERIZATION N/A 05/13/2015   Procedure: Abdominal Aortogram;  Surgeon: Lorretta Harp, MD;  Location: [place] [facility];  Service: Cardiovascular;  Laterality: N/A;  . PERIPHERAL VASCULAR CATHETERIZATION Bilateral 06/27/2015   Procedure: Peripheral Vascular Intervention;  Surgeon: Lorretta Harp, MD;  Location: [place] [facility];  Service: Cardiovascular;  Laterality: Bilateral;  ILIACS  . PERIPHERAL VASCULAR CATHETERIZATION Bilateral 06/27/2015   Procedure: Peripheral Vascular Atherectomy;  Surgeon: Lorretta Harp, MD;  Location: [place] [facility];  Service: Cardiovascular;  Laterality: Bilateral;  . TEE WITHOUT CARDIOVERSION N/A 01/24/2016   Procedure: TRANSESOPHAGEAL ECHOCARDIOGRAM (TEE);  Surgeon: Gaye Pollack, MD;  Location: [facility];  Service: Open Heart Surgery;  Laterality: N/A;  . TONSILLECTOMY     age 22  . TUBAL LIGATION       SOCIAL HISTORY:  Social History   Socioeconomic History  . Marital status: Married    Spouse name: [name]  . Number of children: 4  . Years of education: Not on file  . Highest education level: Not on file  Occupational History  . Occupation: retired    Comment: worked as Presenter, broadcasting for [employer]  Tobacco Use  . Smoking status: Former Smoker    Packs/day: 1.50    Years: 42.00    Pack years: 63.00    Types: Cigarettes    Quit date: 05/19/1999    Years since quitting: 20.6  . Smokeless tobacco: Never Used  Substance and Sexual Activity  . Alcohol use: No  . Drug use: No  . Sexual activity: Yes    Birth control/protection: Surgical  Other  Topics Concern  . Not on file  Social History Narrative  . Not on file   Social Determinants of Health   Financial Resource Strain:   . Difficulty of Paying Living Expenses: Not on file  Food Insecurity:   . Worried About Charity fundraiser in the Last Year: Not on file  . Ran Out of Food in the Last Year: Not on file  Transportation Needs:   . Lack of Transportation (Medical): Not on file  . Lack of Transportation (Non-Medical): Not on file  Physical Activity:   . Days of Exercise per Week: Not on file  . Minutes of Exercise per Session: Not on file  Stress:   . Feeling of Stress : Not on file  Social Connections:   . Frequency of Communication with Friends and Family: Not on file  . Frequency of Social Gatherings with Friends and Family: Not on file  . Attends Religious Services: Not on file  . Active Member of Clubs or Organizations: Not on file  . Attends Archivist Meetings: Not on file  .  Marital Status: Not on file  Intimate Partner Violence:   . Fear of Current or Ex-Partner: Not on file  . Emotionally Abused: Not on file  . Physically Abused: Not on file  . Sexually Abused: Not on file    FAMILY HISTORY:  Family History  Problem Relation Age of Onset  . Ovarian cancer Mother 15  . Cancer Father 29       unsure of which kind, "it was in his glands"  . Hypertension Maternal Grandmother   . Stroke Maternal Grandfather   . Hypertension Son   . Brain cancer Sister   . Hypertension Daughter   . Heart attack Neg Hx   . Colon cancer Neg Hx   . Esophageal cancer Neg Hx   . Inflammatory bowel disease Neg Hx   . Liver disease Neg Hx   . Pancreatic cancer Neg Hx   . Rectal cancer Neg Hx   . Stomach cancer Neg Hx     CURRENT MEDICATIONS:  Outpatient Encounter Medications as of 01/04/2020  Medication Sig  . aspirin EC 81 MG tablet Take 81 mg by mouth every evening.   Marland Kitchen atorvastatin (LIPITOR) 40 MG tablet TAKE (1) TABLET BY MOUTH ONCE DAILY.  Marland Kitchen  clopidogrel (PLAVIX) 75 MG tablet TAKE (1) TABLET BY MOUTH ONCE DAILY. (Patient taking differently: Take 75 mg by mouth daily. )  . esomeprazole (NEXIUM) 20 MG capsule Take 20 mg by mouth daily.   Marland Kitchen levothyroxine (SYNTHROID, LEVOTHROID) 50 MCG tablet Take 50 mcg by mouth daily before breakfast.  . metoprolol tartrate (LOPRESSOR) 25 MG tablet Take 1 tablet (25 mg total) by mouth 2 (two) times daily.  Marland Kitchen gabapentin (NEURONTIN) 100 MG capsule Take 200 mg by mouth every 8 (eight) hours as needed.  Marland Kitchen HYDROcodone-acetaminophen (NORCO/VICODIN) 5-325 MG tablet Take 1 tablet by mouth every 6 (six) hours as needed for moderate pain or severe pain. (Patient not taking: Reported on 11/15/2019)  . meclizine (ANTIVERT) 12.5 MG tablet Take 12.5 mg by mouth every 8 (eight) hours as needed.  . nitroGLYCERIN (NITROSTAT) 0.4 MG SL tablet Place 1 tablet (0.4 mg total) under the tongue every 5 (five) minutes as needed for chest pain. (Patient not taking: Reported on 11/15/2019)   No facility-administered encounter medications on file as of 01/04/2020.    ALLERGIES:  Allergies  Allergen Reactions  . Shrimp [Shellfish Allergy] Nausea And Vomiting    Throws up violently     PHYSICAL EXAM:  ECOG Performance status: 1  Vitals:   01/04/20 1544  BP: 139/66  Pulse: 67  Resp: 17  Temp: (!) 97.1 F (36.2 C)  SpO2: 96%   Filed Weights   01/04/20 1544  Weight: 149 lb 6.4 oz (67.8 kg)    Physical Exam Vitals reviewed.  Constitutional:      Appearance: Normal appearance.  Cardiovascular:     Rate and Rhythm: Normal rate and regular rhythm.     Heart sounds: Normal heart sounds.  Pulmonary:     Effort: Pulmonary effort is normal.     Breath sounds: Normal breath sounds.  Abdominal:     General: There is no distension.     Palpations: Abdomen is soft. There is no mass.  Lymphadenopathy:     Cervical: No cervical adenopathy.  Skin:    General: Skin is warm.     Findings: No bruising.  Neurological:      General: No focal deficit present.     Mental Status: She is alert and  oriented to person, place, and time.  Psychiatric:        Mood and Affect: Mood normal.        Behavior: Behavior normal.      LABORATORY DATA:  I have reviewed the labs as listed.  CBC    Component Value Date/Time   WBC 5.5 11/22/2019 1353   RBC 4.69 11/22/2019 1353   HGB 13.0 11/22/2019 1353   HGB 11.0 (L) 09/08/2019 1403   HCT 43.5 11/22/2019 1353   HCT 34.8 09/08/2019 1403   PLT 215 11/22/2019 1353   PLT 264 09/08/2019 1403   MCV 92.8 11/22/2019 1353   MCV 81 09/08/2019 1403   MCH 27.7 11/22/2019 1353   MCHC 29.9 (L) 11/22/2019 1353   RDW 18.1 (H) 11/22/2019 1353   RDW 20.8 (H) 09/08/2019 1403   LYMPHSABS 1.1 11/22/2019 1353   MONOABS 0.3 11/22/2019 1353   EOSABS 0.0 11/22/2019 1353   BASOSABS 0.1 11/22/2019 1353   CMP Latest Ref Rng & Units 12/25/2019 10/12/2019 10/10/2019  Glucose 70 - 99 mg/dL - 135(H) 114(H)  BUN 8 - 23 mg/dL - 16 16  Creatinine 0.44 - 1.00 mg/dL 1.30(H) 1.38(H) 1.42(H)  Sodium 135 - 145 mmol/L - 135 141  Potassium 3.5 - 5.1 mmol/L - 4.4 4.0  Chloride 98 - 111 mmol/L - 103 105  CO2 22 - 32 mmol/L - 23 27  Calcium 8.9 - 10.3 mg/dL - 8.8(L) 9.8  Total Protein 6.0 - 8.3 g/dL - - -  Total Bilirubin 0.2 - 1.2 mg/dL - - -  Alkaline Phos 39 - 117 U/L - - -  AST 0 - 37 U/L - - -  ALT 0 - 35 U/L - - -       DIAGNOSTIC IMAGING:  I have independently reviewed the scans and discussed with the patient.    ASSESSMENT & PLAN:   Cancer of ascending colon s/p robotic proximal colectomy 10/11/2019 1.  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 -Right hemicolectomy on 10/11/2019, grade 3 adenocarcinoma, negative margins, positive LVSI, 2 tumor deposits, 0/15 lymph nodes positive, PT3PN1C. 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  MSI is high.   BRAF V6 entity positive.  MLH1 hyper methylation present. -PET scan showed lymph node in the right axillary region.  Biopsy consistent with metastatic colon cancer. -Last CEA was 10.7 on 11/22/2019.  We plan to repeat CEA level at next visit.  I will see her back after the surgery. -If she has confirmed metastatic disease from the colon cancer to the right axilla, she could receive immunotherapy single agent for limited duration.  2.  Right breast mass: -MRI of the breast on 12/25/2019 showed 1 cm mass behind the right areola.  There is also suspicious mass in the left breast. 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  E-cadherin is negative indicating lobular component. 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 -I have made referral to [facility] surgery.  I will see her back after surgery.  3.  Normocytic anemia: -Serial Feraheme on 10/27/2019 and 10/24/2019. -Last CBC reviewed by me was normal.  Ferritin improved to 230.     Orders placed this encounter:  No orders of the defined types were placed in this encounter.     Derek Jack, MD [place] [phone]

## 2020-01-04 NOTE — Patient Instructions (Signed)
[place] at [facility] Discharge Instructions  You were seen today by Dr. Delton Coombes. 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 You will need to keep your appointment with [facility] to discuss your options of surgery. He will see you back in 6 weeks for follow up.   Thank you for choosing [place] at [facility] to provide your oncology and hematology care.  To afford each patient quality time with our provider, please arrive at least 15 minutes before your scheduled appointment time.   If you have a lab appointment with the [facility] please come in thru the  Main Entrance and check in at the main information desk  You need to re-schedule your appointment should you arrive 10 or more minutes late.  We strive to give you quality time with our providers, and arriving late affects you and other patients whose appointments are after yours.  Also, if you no show three or more times for appointments you may be dismissed from the clinic at the providers discretion.     Again, thank you for choosing [facility].  Our hope is that these requests will decrease the amount of time that you wait before being seen by our physicians.       _____________________________________________________________  Should you have questions after your visit to [facility], please contact our office at [phone] between the hours of 8:00 a.m. and 4:30 p.m.  Voicemails left after 4:00 p.m. will not be returned until the following business day.  For prescription refill requests, have your pharmacy contact our office and allow 72 hours.    Cancer Center Support Programs:   > Cancer Support Group  2nd Tuesday of the month 1pm-2pm, Journey Room

## 2020-01-05 ENCOUNTER — Ambulatory Visit: Payer: Self-pay | Admitting: Surgery

## 2020-01-05 ENCOUNTER — Telehealth: Payer: Self-pay | Admitting: *Deleted

## 2020-01-05 ENCOUNTER — Encounter (HOSPITAL_COMMUNITY): Payer: Self-pay | Admitting: Hematology

## 2020-01-05 DIAGNOSIS — C50911 Malignant neoplasm of unspecified site of right female breast: Secondary | ICD-10-CM

## 2020-01-05 DIAGNOSIS — N6099 Unspecified benign mammary dysplasia of unspecified breast: Secondary | ICD-10-CM

## 2020-01-05 NOTE — Assessment & Plan Note (Signed)
1.  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 -Right hemicolectomy on 10/11/2019, grade 3 adenocarcinoma, negative margins, positive LVSI, 2 tumor deposits, 0/15 lymph nodes positive, PT3PN1C. 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  MSI is high.  BRAF V6 entity positive.  MLH1 hyper methylation present. -PET scan showed lymph node in the right axillary region.  Biopsy consistent with metastatic colon cancer. -Last CEA was 10.7 on 11/22/2019.  We plan to repeat CEA level at next visit.  I will see her back after the surgery. -If she has confirmed metastatic disease from the colon cancer to the right axilla, she could receive immunotherapy single agent for limited duration.  2.  Right breast mass: -MRI of the breast on 12/25/2019 showed 1 cm mass behind the right areola.  There is also suspicious mass in the left breast. 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  E-cadherin is negative indicating lobular component. 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 -I have made referral to [facility] surgery.  I will see her back after surgery.  3.  Normocytic anemia: -Serial Feraheme on 10/27/2019 and 10/24/2019. -Last CBC reviewed by me was normal.  Ferritin improved to 230.

## 2020-01-05 NOTE — H&P (Signed)
Jessica Taylor Documented: 01/05/2020 10:06 AM Location: [facility] Patient #: [id number] DOB: [birth year] Married / Language: Jessica Taylor / Race: White Female  History of Present Illness Jessica Moores A. Fleurette Woolbright MD; 01/05/2020 12:49 PM) Patient words: Patient presents for evaluation of a right axillary lymph node that underwent core biopsy secondary to her recent PET scan which showed metastatic adenocarcinoma. She is undergoing treatment for colon cancer and the PET scan showed enhancement in the right axilla. Core biopsy of a right axillary lymph node was done which showed metastatic adenocarcinoma. 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 She is here today to discuss surgical options of her breast cancer and atypical ductal hyperplasia. She is bruised from her multiple biopsies but otherwise in good health. Her daughter is here with her today as well.       79 year old female with a single hypermetabolic low lying right axillary lymph node on recent PET-CT. Subsequent biopsy demonstrated metastatic colon carcinoma, in keeping with the patient's medical history.  LABS: None performed on site.  EXAM: BILATERAL BREAST MRI WITH AND WITHOUT CONTRAST  TECHNIQUE: Multiplanar, multisequence MR images of both breasts were obtained prior to and following the intravenous administration of 7.5 ml of Gadavist.  Three-dimensional MR images were rendered by post-processing of the original MR data on an independent workstation. The three-dimensional MR images were interpreted, and findings are reported in the following complete MRI report for this study. Three dimensional images were evaluated at the independent DynaCad workstation  COMPARISON: Previous  exam(s).  FINDINGS: Breast composition: a. Almost entirely fat.  Background parenchymal enhancement: Minimal.  Right breast: There is an irregular, enhancing mass in the subareolar right breast at middle depth (series 10501, image 143/248). It measures 10 x 5 x 8 mm and demonstrates rapid enhancement and suspicious washout kinetics. No additional suspicious mass or non-mass enhancement within the remainder of the right breast.  Left breast: There is a 5 mm enhancing focus in the far posterior central left breast inferiorly (series 10504, image 163/248). This demonstrates rapid uptake and suspicious washout. There is no T2 correlate. No additional suspicious findings in the remainder of the left breast.  Lymph nodes: Enhancing 2 cm mass with associated post biopsy clip is seen anterior to the right pectoralis muscle. This is consistent with the patient's biopsy-proven site of malignancy. No other suspicious axillary or internal mammary chain lymphadenopathy.  Ancillary findings: None.  IMPRESSION: 1. 2 cm enhancing mass anterior to the upper right pectoralis muscle consistent with the patient's biopsy-proven site of malignancy. 2. Suspicious 1 cm enhancing mass in the subareolar right breast (image 143/248). Recommendation is for MRI guided biopsy. 3. Indeterminate 5 mm enhancing focus in the posterior central left breast (image 163/248). Recommendation is for MRI guided biopsy.  RECOMMENDATION: MRI guided biopsies of the bilateral breasts.  BI-RADS CATEGORY 4: Suspicious.   Electronically Signed By: Kristopher Oppenheim M.D. On: 12/25/2019 14:24                  SURGICAL PATHOLOGY CASE: [id number] PATIENT: Jessica Taylor Surgical Pathology Report Clinical History: hx of colorectal ca with recent PET/CT demonstrating a hypermetabolic right axillary lymph node FINAL MICROSCOPIC DIAGNOSIS: A. LYMPH NODE, RIGHT BREAST / AXILLARY TAIL, NEEDLECORE  BIOPSY: - Metastatic carcinoma, consistent with patient's clinical history of primary colorectal  carcinoma. See comment COMMENT: Immunohistochemical stains show that the metastatic tumor cells are positive for CDX2 and CK20; while they are negative for CK7, GATA3 and GCDFP, consistent with above interpretation. GROSS DESCRIPTION: Received in 2 containers (one with saline and one with formalin) are a total of 6 cores of gray-white to yellow soft to firm tissue which range from 0.5 x 0.1 cm to 1 x 0.1 cm. The cores in saline are submitted in RPMI for possible flow cytometry, with cores in formalin submitted in 1 block for each histology. (Time in formalin 0935 hours and cold ischemic time less than 1 minute) SW 12/05/2019 Final Diagnosis performed by Jaquita Folds, MD. Electronically signed 12/07/2019 Technical component performed at [facility], [place] [address] [place], [place] [address]. Professional component performed at [employer]. [facility], [place] [address] [place], [place] [address]. Immunohistochemistry Technical component (if applicable) was performed at [facility]. [address], [place], [place],  [address]. IMMUNOHISTOCHEMISTRY DISCLAIMER (if applicable): Some of these immunohistochemical stains may have been developed and the performance characteristics determine by [facility]. Some may not have been cleared or approved by the U.S. Food and Drug Administration. The FDA has determined that such clearance or approval is not necessary. This test is used for clinical purposes. It should not be regarded as investigational or for research. This laboratory is certified under the [place] (CLIA-88) as qualified to perform high complexity clinical laboratory testing. The controls stained appropriately. Resulting Agency [facility]  <[email or web address]> Specimen Collected: 12/05/19 09:42 Last Resulted: 12/07/19 10:47  Lab Flowsheet <[email or web address]&136>  Order Details <[email or web address]&137>  View Encounter <epic://[email or web address]&138>  Lab and Collection Details <epic://[email or web address]&139>  Routing <epic://[email or web address]&140>  Result History <[email or web address] Linked Documents View Image <[email or web address]&142>  Diagnosis 1. 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 2. Breast, left, needle core biopsy, posterior central - ATYPICAL DUCTAL HYPERPLASIA (FOCAL). - REGION OF CHRONIC INFLAMMATION. - NO CARCINOMA IDENTIFIED. Microscopic Comment 1. Immunohistochemistry for E-cadherin will be reported separately. ADDENDUM: Immunohistochemistry for E-cadherin is negative consistent with a lobular phenotype. 2. The region of chronic inflammation lacks the architectural features of a lymph node. Results reported to [facility] on 01/03/2020. Intradepartmental consultation (Dr. Tresa Moore). Gillie Manners MD Pathologist, Electronic Signature (Case signed 01/03/2020)                   Diagnosis 1. 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 2. Breast, left, needle core biopsy, posterior central - ATYPICAL DUCTAL HYPERPLASIA (FOCAL). - REGION OF CHRONIC INFLAMMATION. - NO CARCINOMA IDENTIFIED.  The patient is a 79 year old female.   Allergies Emeline Gins, [place]; 01/05/2020 10:07 AM) Shrimp Allergies Reconciled  Medication History Emeline Gins, CMA; 01/05/2020 10:07 AM) Meclizine HCl (12.5MG  Tablet, 1 (one) Oral every eight hours, as needed, Taken starting 10/16/2019) Active. Gabapentin (100MG  Capsule, 1 (one) Oral every eight hours, as needed, Taken starting 10/16/2019) Active. Metoprolol  Tartrate (25MG  Tablet, Oral) Active. Levothyroxine Sodium (50MCG Tablet, Oral) Active. Atorvastatin Calcium (40MG  Tablet, Oral) Active. Clopidogrel Bisulfate (75MG  Tablet, Oral) Active. HYDROcodone-Acetaminophen (5-325MG  Tablet, Oral) Active. Ondansetron (4MG  Tablet Disint, Oral) Active. Plavix (75MG  Tablet, Oral) Active. Medications Reconciled    Vitals Emeline Gins CMA; 01/05/2020 10:07 AM) 01/05/2020 10:06 AM Weight: 148.8 lb Height: 63in Body Surface Area: 1.71 m Body Mass Index: 26.36 kg/m  Temp.: 97.40F  Pulse:  61 (Regular)  BP: 140/82 (Sitting, Left Arm, Standard)        Physical Exam (Shandale Malak A. Bryttani Blew MD; 01/05/2020 12:50 PM)  General Mental Status-Alert. General Appearance-Consistent with stated age. Hydration-Well hydrated. Voice-Normal.  Chest and Lung Exam Chest and lung exam reveals -quiet, even and easy respiratory effort with no use of accessory muscles and on auscultation, normal breath sounds, no adventitious sounds and normal vocal resonance. Inspection Chest Wall - Normal. Back - normal.  Breast Note: Bruising noted bilateral breasts. No evidence of axillary adenopathy bilaterally. No nipple discharge or inversion bilaterally.  Cardiovascular Cardiovascular examination reveals -on palpation PMI is normal in location and amplitude, no palpable S3 or S4. Normal cardiac borders., normal heart sounds, regular rate and rhythm with no murmurs, carotid auscultation reveals no bruits and normal pedal pulses bilaterally.  Neurologic Neurologic evaluation reveals -alert and oriented x 3 with no impairment of recent or remote memory. Mental Status-Normal.    Assessment & Plan (Brayton Baumgartner A. Ellias Mcelreath MD; 01/05/2020 12:52 PM)  BREAST CANCER, RIGHT (C50.911) Impression: Stage II disease. His followed by medical oncology and already has an appointment to see them. She has opted for breast conserving surgery which will include  right breast lumpectomy with right axillary sentinel lymph node mapping. We will do a seed localized targeted right lymph node biopsy as well. Left breast requires lumpectomy for atypical ductal hyperplasia and this was discussed as well. We discussed different surgical options from breast conserving surgery versus mastectomy with potential reconstruction. We discussed the significance lymph node in her right axilla and this may actually be from a breast primary once fully evaluated. I am waiting for receptor status and we'll refer her to radiation oncology to discuss postoperative radiation therapy. She is comfortable with breast conserving surgery and we will proceed with the above mentioned plan. Discussed risk of surgery, lymphedema, and postoperative recovery. Risk of lumpectomy include bleeding, infection, seroma, more surgery, use of seed/wire, wound care, cosmetic deformity and the need for other treatments, death , blood clots, death. Pt agrees to proceed. Risk of sentinel lymph node mapping include bleeding, infection, lymphedema, shoulder pain. stiffness, dye allergy. cosmetic deformity , blood clots, death, need for more surgery. Pt agrees to proceed.  Total time 45 minutes discussing her condition, face-to-face examination, review of records, review of pathology, review of imaging, and documentation.  Current Plans You are being scheduled for surgery- Our schedulers will call you.  You should hear from our office's scheduling department within 5 working days about the location, date, and time of surgery. We try to make accommodations for patient's preferences in scheduling surgery, but sometimes the OR schedule or the surgeon's schedule prevents [place] from making those accommodations.  If you have not heard from our office [phone]) in 5 working days, call the office and ask for your surgeon's nurse.  If you have other questions about your diagnosis, plan, or surgery, call the office and  ask for your surgeon's nurse.  Pt Education - [facility] Breast Cancer Information Given - [employer] "Breast Journey" Package Pt Education - Pamphlet Given - Breast Biopsy: discussed with patient and provided information. We discussed the staging and pathophysiology of breast cancer. We discussed all of the different options for treatment for breast cancer including surgery, chemotherapy, radiation therapy, Herceptin, and antiestrogen therapy. We discussed a sentinel lymph node biopsy as she does not appear to having lymph node involvement right now. We discussed the performance of that with injection of radioactive tracer and blue dye.  We discussed that she would have an incision underneath her axillary hairline. We discussed that there is a bout a 10-20% chance of having a positive node with a sentinel lymph node biopsy and we will await the permanent pathology to make any other first further decisions in terms of her treatment. One of these options might be to return to the operating room to perform an axillary lymph node dissection. We discussed about a 1-2% risk lifetime of chronic shoulder pain as well as lymphedema associated with a sentinel lymph node biopsy. We discussed the options for treatment of the breast cancer which included lumpectomy versus a mastectomy. We discussed the performance of the lumpectomy with a wire placement. We discussed a 10-20% chance of a positive margin requiring reexcision in the operating room. We also discussed that she may need radiation therapy or antiestrogen therapy or both if she undergoes lumpectomy. We discussed the mastectomy and the postoperative care for that as well. We discussed that there is no difference in her survival whether she undergoes lumpectomy with radiation therapy or antiestrogen therapy versus a mastectomy. There is a slight difference in the local recurrence rate being 3-5% with lumpectomy and about 1% with a mastectomy. We discussed the risks of  operation including bleeding, infection, possible reoperation. She understands her further therapy will be based on what her stages at the time of her operation.  Pt Education - flb breast cancer surgery: discussed with patient and provided information. Pt Education - [facility] Breast Biopsy HCI: discussed with patient and provided information. Pt Education - ABC (After Breast Cancer) Class Info: discussed with patient and provided information. Pt Education - [facility] Breast Pains Education  ATYPICAL DUCTAL HYPERPLASIA OF LEFT BREAST (N60.92)

## 2020-01-05 NOTE — Telephone Encounter (Signed)
   [facility] Pre-operative Risk Assessment    Request for surgical clearance:  1. What type of surgery is being performed? B/L BREAST LUMPECTOMY   2. When is this surgery scheduled? TBD   3. What type of clearance is required (medical clearance vs. Pharmacy clearance to hold med vs. Both)? MEDICAL  4. Are there any medications that need to be held prior to surgery and how long? PLAVIX AND ASA X 5 DAYS PRIOR  5. Practice name and name of physician performing surgery? [facility]; DR. Marcello Moores COR[place]T   6. What is your office phone number [phone]    7.   What is your office fax number [phone] ATTN: MICHELLE BROOKS, CMA  8.   Anesthesia type (None, local, MAC, general) ? GENERAL   Julaine Hua 01/05/2020, 2:10 PM  _________________________________________________________________   (provider comments below)

## 2020-01-08 ENCOUNTER — Telehealth: Payer: Self-pay

## 2020-01-08 ENCOUNTER — Other Ambulatory Visit: Payer: Self-pay | Admitting: Surgery

## 2020-01-08 DIAGNOSIS — C50911 Malignant neoplasm of unspecified site of right female breast: Secondary | ICD-10-CM

## 2020-01-08 NOTE — Telephone Encounter (Signed)
She may hold her aspirin and Plavix for 5 days prior to lumpectomy.  Stable coronary artery disease Candee Furbish, MD

## 2020-01-08 NOTE — Telephone Encounter (Signed)
NOTES ON FILE FROM [facility]

## 2020-01-08 NOTE — Telephone Encounter (Signed)
   Primary Cardiologist: Candee Furbish, MD  Chart reviewed and patient contacted by phone as part of pre-operative protocol coverage. Given past medical history and time since last visit, based on ACC/AHA guidelines, Jessica Taylor would be at acceptable risk for the planned procedure without further cardiovascular testing.   OK to hold Plavix and aspirin 5 days pre op if needed.  I will route this recommendation to the requesting party via Epic fax function and remove from pre-op pool.  Please call with questions.  Kerin Ransom, PA-C 01/08/2020, 11:53 AM

## 2020-02-08 ENCOUNTER — Encounter (HOSPITAL_BASED_OUTPATIENT_CLINIC_OR_DEPARTMENT_OTHER): Payer: Self-pay

## 2020-02-08 ENCOUNTER — Other Ambulatory Visit: Payer: Self-pay

## 2020-02-10 ENCOUNTER — Other Ambulatory Visit (HOSPITAL_COMMUNITY)
Admission: RE | Admit: 2020-02-10 | Discharge: 2020-02-10 | Disposition: A | Discharge status: Home / Self Care | Payer: Medicare Other | Source: Ambulatory Visit | Attending: Surgery | Admitting: Surgery

## 2020-02-10 DIAGNOSIS — Z20822 Contact with and (suspected) exposure to covid-19: Secondary | ICD-10-CM | POA: Diagnosis not present

## 2020-02-10 DIAGNOSIS — Z01812 Encounter for preprocedural laboratory examination: Secondary | ICD-10-CM | POA: Insufficient documentation

## 2020-02-10 LAB — SARS CORONAVIRUS 2 (TAT 6-24 HRS): SARS Coronavirus 2: NEGATIVE

## 2020-02-12 ENCOUNTER — Encounter (HOSPITAL_BASED_OUTPATIENT_CLINIC_OR_DEPARTMENT_OTHER)
Admission: RE | Admit: 2020-02-12 | Discharge: 2020-02-12 | Disposition: A | Discharge status: Home / Self Care | Payer: Medicare Other | Source: Ambulatory Visit | Attending: Surgery | Admitting: Surgery

## 2020-02-12 DIAGNOSIS — Z01812 Encounter for preprocedural laboratory examination: Secondary | ICD-10-CM | POA: Diagnosis not present

## 2020-02-12 LAB — COMPREHENSIVE METABOLIC PANEL
ALT: 26 U/L (ref 0–44)
AST: 20 U/L (ref 15–41)
Albumin: 3.8 g/dL (ref 3.5–5.0)
Alkaline Phosphatase: 75 U/L (ref 38–126)
Anion gap: 7 (ref 5–15)
BUN: 30 mg/dL — ABNORMAL HIGH (ref 8–23)
CO2: 26 mmol/L (ref 22–32)
Calcium: 9.6 mg/dL (ref 8.9–10.3)
Chloride: 106 mmol/L (ref 98–111)
Creatinine, Ser: 1.59 mg/dL — ABNORMAL HIGH (ref 0.44–1.00)
GFR calc Af Amer: 35 mL/min — ABNORMAL LOW (ref >60)
GFR calc non Af Amer: 30 mL/min — ABNORMAL LOW (ref >60)
Glucose, Bld: 94 mg/dL (ref 70–99)
Potassium: 4.7 mmol/L (ref 3.5–5.1)
Sodium: 139 mmol/L (ref 135–145)
Total Bilirubin: 1 mg/dL (ref 0.3–1.2)
Total Protein: 6.3 g/dL — ABNORMAL LOW (ref 6.5–8.1)

## 2020-02-12 LAB — CBC WITH DIFFERENTIAL/PLATELET
Abs Immature Granulocytes: 0.02 10*3/uL (ref 0.00–0.07)
Basophils Absolute: 0.1 10*3/uL (ref 0.0–0.1)
Basophils Relative: 1 %
Eosinophils Absolute: 0 10*3/uL (ref 0.0–0.5)
Eosinophils Relative: 0 %
HCT: 47.4 % — ABNORMAL HIGH (ref 36.0–46.0)
Hemoglobin: 15 g/dL (ref 12.0–15.0)
Immature Granulocytes: 0 %
Lymphocytes Relative: 22 %
Lymphs Abs: 1.1 10*3/uL (ref 0.7–4.0)
MCH: 29 pg (ref 26.0–34.0)
MCHC: 31.6 g/dL (ref 30.0–36.0)
MCV: 91.5 fL (ref 80.0–100.0)
Monocytes Absolute: 0.4 10*3/uL (ref 0.1–1.0)
Monocytes Relative: 8 %
Neutro Abs: 3.5 10*3/uL (ref 1.7–7.7)
Neutrophils Relative %: 69 %
Platelets: 173 10*3/uL (ref 150–400)
RBC: 5.18 MIL/uL — ABNORMAL HIGH (ref 3.87–5.11)
RDW: 13.9 % (ref 11.5–15.5)
WBC: 5.1 10*3/uL (ref 4.0–10.5)
nRBC: 0 % (ref 0.0–0.2)

## 2020-02-12 NOTE — Progress Notes (Signed)

## 2020-02-13 ENCOUNTER — Other Ambulatory Visit: Payer: Self-pay

## 2020-02-13 ENCOUNTER — Ambulatory Visit
Admission: RE | Admit: 2020-02-13 | Discharge: 2020-02-13 | Disposition: A | Discharge status: Home / Self Care | Payer: Medicare Other | Source: Ambulatory Visit | Attending: Surgery | Admitting: Surgery

## 2020-02-13 DIAGNOSIS — N6099 Unspecified benign mammary dysplasia of unspecified breast: Secondary | ICD-10-CM

## 2020-02-13 DIAGNOSIS — C50911 Malignant neoplasm of unspecified site of right female breast: Secondary | ICD-10-CM

## 2020-02-14 ENCOUNTER — Encounter (HOSPITAL_COMMUNITY): Payer: Medicare Other

## 2020-02-14 ENCOUNTER — Ambulatory Visit (HOSPITAL_COMMUNITY): Payer: Medicare Other

## 2020-02-15 ENCOUNTER — Inpatient Hospital Stay (HOSPITAL_COMMUNITY): Payer: Medicare Other | Admitting: Hematology

## 2020-02-19 ENCOUNTER — Ambulatory Visit: Payer: Self-pay | Admitting: Surgery

## 2020-02-19 DIAGNOSIS — Z170 Estrogen receptor positive status [ER+]: Secondary | ICD-10-CM

## 2020-02-19 DIAGNOSIS — C50911 Malignant neoplasm of unspecified site of right female breast: Secondary | ICD-10-CM

## 2020-02-20 ENCOUNTER — Other Ambulatory Visit: Payer: Self-pay | Admitting: Surgery

## 2020-02-20 DIAGNOSIS — Z170 Estrogen receptor positive status [ER+]: Secondary | ICD-10-CM

## 2020-02-20 DIAGNOSIS — C50911 Malignant neoplasm of unspecified site of right female breast: Secondary | ICD-10-CM

## 2020-03-25 NOTE — Progress Notes (Signed)
Chart and stress test reviewed with Dr. Fransisco Beau. Ok to proceed with surgery as scheduled.

## 2020-03-26 ENCOUNTER — Other Ambulatory Visit: Payer: Self-pay

## 2020-03-26 ENCOUNTER — Encounter (HOSPITAL_BASED_OUTPATIENT_CLINIC_OR_DEPARTMENT_OTHER): Payer: Self-pay | Admitting: Surgery

## 2020-03-29 ENCOUNTER — Other Ambulatory Visit (HOSPITAL_COMMUNITY)
Admission: RE | Admit: 2020-03-29 | Discharge: 2020-03-29 | Disposition: A | Discharge status: Home / Self Care | Payer: Medicare Other | Source: Ambulatory Visit | Attending: Surgery | Admitting: Surgery

## 2020-03-29 DIAGNOSIS — Z20822 Contact with and (suspected) exposure to covid-19: Secondary | ICD-10-CM | POA: Diagnosis not present

## 2020-03-29 DIAGNOSIS — Z01812 Encounter for preprocedural laboratory examination: Secondary | ICD-10-CM | POA: Insufficient documentation

## 2020-03-29 LAB — SARS CORONAVIRUS 2 (TAT 6-24 HRS): SARS Coronavirus 2: NEGATIVE

## 2020-03-29 NOTE — Progress Notes (Signed)

## 2020-04-01 ENCOUNTER — Ambulatory Visit
Admission: RE | Admit: 2020-04-01 | Discharge: 2020-04-01 | Disposition: A | Discharge status: Home / Self Care | Payer: Medicare Other | Source: Ambulatory Visit | Attending: Surgery | Admitting: Surgery

## 2020-04-01 ENCOUNTER — Other Ambulatory Visit: Payer: Self-pay

## 2020-04-01 DIAGNOSIS — C50911 Malignant neoplasm of unspecified site of right female breast: Secondary | ICD-10-CM

## 2020-04-01 DIAGNOSIS — Z170 Estrogen receptor positive status [ER+]: Secondary | ICD-10-CM

## 2020-04-02 ENCOUNTER — Ambulatory Visit
Admission: RE | Admit: 2020-04-02 | Discharge: 2020-04-02 | Disposition: A | Discharge status: Home / Self Care | Payer: Medicare Other | Source: Ambulatory Visit | Attending: Surgery | Admitting: Surgery

## 2020-04-02 ENCOUNTER — Other Ambulatory Visit: Payer: Self-pay

## 2020-04-02 ENCOUNTER — Ambulatory Visit (HOSPITAL_COMMUNITY)
Admission: RE | Admit: 2020-04-02 | Discharge: 2020-04-02 | Disposition: A | Discharge status: Home / Self Care | Payer: Medicare Other | Source: Ambulatory Visit | Attending: Surgery | Admitting: Surgery

## 2020-04-02 ENCOUNTER — Encounter (HOSPITAL_BASED_OUTPATIENT_CLINIC_OR_DEPARTMENT_OTHER)
Admission: RE | Disposition: A | Discharge status: Home / Self Care | Payer: Self-pay | Source: Home / Self Care | Attending: Surgery

## 2020-04-02 ENCOUNTER — Ambulatory Visit (HOSPITAL_BASED_OUTPATIENT_CLINIC_OR_DEPARTMENT_OTHER): Payer: Medicare Other | Admitting: Anesthesiology

## 2020-04-02 ENCOUNTER — Encounter (HOSPITAL_BASED_OUTPATIENT_CLINIC_OR_DEPARTMENT_OTHER): Payer: Self-pay | Admitting: Surgery

## 2020-04-02 ENCOUNTER — Ambulatory Visit (HOSPITAL_COMMUNITY)
Admission: RE | Admit: 2020-04-02 | Discharge: 2020-04-02 | Disposition: A | Discharge status: Home / Self Care | Payer: Medicare Other | Attending: Surgery | Admitting: Surgery

## 2020-04-02 DIAGNOSIS — Z951 Presence of aortocoronary bypass graft: Secondary | ICD-10-CM | POA: Insufficient documentation

## 2020-04-02 DIAGNOSIS — D649 Anemia, unspecified: Secondary | ICD-10-CM | POA: Insufficient documentation

## 2020-04-02 DIAGNOSIS — Z170 Estrogen receptor positive status [ER+]: Secondary | ICD-10-CM | POA: Diagnosis not present

## 2020-04-02 DIAGNOSIS — Z955 Presence of coronary angioplasty implant and graft: Secondary | ICD-10-CM | POA: Diagnosis not present

## 2020-04-02 DIAGNOSIS — Z85038 Personal history of other malignant neoplasm of large intestine: Secondary | ICD-10-CM | POA: Diagnosis not present

## 2020-04-02 DIAGNOSIS — I739 Peripheral vascular disease, unspecified: Secondary | ICD-10-CM | POA: Diagnosis not present

## 2020-04-02 DIAGNOSIS — C50111 Malignant neoplasm of central portion of right female breast: Secondary | ICD-10-CM | POA: Diagnosis not present

## 2020-04-02 DIAGNOSIS — K219 Gastro-esophageal reflux disease without esophagitis: Secondary | ICD-10-CM | POA: Diagnosis not present

## 2020-04-02 DIAGNOSIS — C50911 Malignant neoplasm of unspecified site of right female breast: Secondary | ICD-10-CM

## 2020-04-02 DIAGNOSIS — I110 Hypertensive heart disease with heart failure: Secondary | ICD-10-CM | POA: Diagnosis not present

## 2020-04-02 DIAGNOSIS — Z79899 Other long term (current) drug therapy: Secondary | ICD-10-CM | POA: Diagnosis not present

## 2020-04-02 DIAGNOSIS — E039 Hypothyroidism, unspecified: Secondary | ICD-10-CM | POA: Insufficient documentation

## 2020-04-02 DIAGNOSIS — Z7902 Long term (current) use of antithrombotics/antiplatelets: Secondary | ICD-10-CM | POA: Diagnosis not present

## 2020-04-02 DIAGNOSIS — Z87891 Personal history of nicotine dependence: Secondary | ICD-10-CM | POA: Diagnosis not present

## 2020-04-02 DIAGNOSIS — I509 Heart failure, unspecified: Secondary | ICD-10-CM | POA: Diagnosis not present

## 2020-04-02 DIAGNOSIS — C773 Secondary and unspecified malignant neoplasm of axilla and upper limb lymph nodes: Secondary | ICD-10-CM | POA: Diagnosis not present

## 2020-04-02 DIAGNOSIS — N6092 Unspecified benign mammary dysplasia of left breast: Secondary | ICD-10-CM | POA: Insufficient documentation

## 2020-04-02 DIAGNOSIS — I2510 Atherosclerotic heart disease of native coronary artery without angina pectoris: Secondary | ICD-10-CM | POA: Diagnosis not present

## 2020-04-02 DIAGNOSIS — I252 Old myocardial infarction: Secondary | ICD-10-CM | POA: Diagnosis not present

## 2020-04-02 DIAGNOSIS — Z91013 Allergy to seafood: Secondary | ICD-10-CM | POA: Insufficient documentation

## 2020-04-02 HISTORY — PX: BREAST LUMPECTOMY WITH RADIOACTIVE SEED AND SENTINEL LYMPH NODE BIOPSY: SHX6550

## 2020-04-02 LAB — CBC WITH DIFFERENTIAL/PLATELET
Abs Immature Granulocytes: 0.02 10*3/uL (ref 0.00–0.07)
Basophils Absolute: 0.1 10*3/uL (ref 0.0–0.1)
Basophils Relative: 1 %
Eosinophils Absolute: 0 10*3/uL (ref 0.0–0.5)
Eosinophils Relative: 0 %
HCT: 50.4 % — ABNORMAL HIGH (ref 36.0–46.0)
Hemoglobin: 15.7 g/dL — ABNORMAL HIGH (ref 12.0–15.0)
Immature Granulocytes: 0 %
Lymphocytes Relative: 28 %
Lymphs Abs: 1.5 10*3/uL (ref 0.7–4.0)
MCH: 29.3 pg (ref 26.0–34.0)
MCHC: 31.2 g/dL (ref 30.0–36.0)
MCV: 94 fL (ref 80.0–100.0)
Monocytes Absolute: 0.4 10*3/uL (ref 0.1–1.0)
Monocytes Relative: 7 %
Neutro Abs: 3.4 10*3/uL (ref 1.7–7.7)
Neutrophils Relative %: 64 %
Platelets: 186 10*3/uL (ref 150–400)
RBC: 5.36 MIL/uL — ABNORMAL HIGH (ref 3.87–5.11)
RDW: 13.5 % (ref 11.5–15.5)
WBC: 5.4 10*3/uL (ref 4.0–10.5)
nRBC: 0 % (ref 0.0–0.2)

## 2020-04-02 LAB — COMPREHENSIVE METABOLIC PANEL
ALT: 20 U/L (ref 0–44)
AST: 22 U/L (ref 15–41)
Albumin: 4.1 g/dL (ref 3.5–5.0)
Alkaline Phosphatase: 95 U/L (ref 38–126)
Anion gap: 9 (ref 5–15)
BUN: 25 mg/dL — ABNORMAL HIGH (ref 8–23)
CO2: 29 mmol/L (ref 22–32)
Calcium: 10 mg/dL (ref 8.9–10.3)
Chloride: 105 mmol/L (ref 98–111)
Creatinine, Ser: 1.31 mg/dL — ABNORMAL HIGH (ref 0.44–1.00)
GFR calc Af Amer: 44 mL/min — ABNORMAL LOW (ref >60)
GFR calc non Af Amer: 38 mL/min — ABNORMAL LOW (ref >60)
Glucose, Bld: 92 mg/dL (ref 70–99)
Potassium: 4.4 mmol/L (ref 3.5–5.1)
Sodium: 143 mmol/L (ref 135–145)
Total Bilirubin: 0.8 mg/dL (ref 0.3–1.2)
Total Protein: 6.7 g/dL (ref 6.5–8.1)

## 2020-04-02 SURGERY — BREAST LUMPECTOMY WITH RADIOACTIVE SEED AND SENTINEL LYMPH NODE BIOPSY
Anesthesia: General | Site: Breast | Laterality: Bilateral

## 2020-04-02 MED ORDER — CHLORHEXIDINE GLUCONATE CLOTH 2 % EX PADS: 6.0000 | MEDICATED_PAD | Freq: Once | CUTANEOUS | Status: DC

## 2020-04-02 MED ORDER — FENTANYL CITRATE (PF) 100 MCG/2ML IJ SOLN
INTRAMUSCULAR | Status: AC
  Filled 2020-04-02: qty 2

## 2020-04-02 MED ORDER — BUPIVACAINE HCL (PF) 0.25 % IJ SOLN
INTRAMUSCULAR | Status: AC
  Filled 2020-04-02: qty 120

## 2020-04-02 MED ORDER — HYDROCODONE-ACETAMINOPHEN 5-325 MG PO TABS
1.0000 | ORAL_TABLET | Freq: Four times a day (QID) | ORAL | 0 refills | Status: DC | PRN
Start: 2020-04-02 — End: 2020-05-24

## 2020-04-02 MED ORDER — ONDANSETRON HCL 4 MG/2ML IJ SOLN
INTRAMUSCULAR | Status: AC
  Filled 2020-04-02: qty 2

## 2020-04-02 MED ORDER — TECHNETIUM TC 99M SULFUR COLLOID FILTERED
1.0000 | Freq: Once | INTRAVENOUS | Status: AC | PRN
  Administered 2020-04-02: 1 via INTRADERMAL

## 2020-04-02 MED ORDER — MIDAZOLAM HCL 2 MG/2ML IJ SOLN
INTRAMUSCULAR | Status: AC
  Filled 2020-04-02: qty 2

## 2020-04-02 MED ORDER — SODIUM CHLORIDE 0.9 % IV SOLN
INTRAVENOUS | Status: DC | PRN
  Administered 2020-04-02 (×2): 25 ug/min via INTRAVENOUS

## 2020-04-02 MED ORDER — PROPOFOL 500 MG/50ML IV EMUL
INTRAVENOUS | Status: AC
  Filled 2020-04-02: qty 50

## 2020-04-02 MED ORDER — AMISULPRIDE (ANTIEMETIC) 5 MG/2ML IV SOLN
10.0000 mg | Freq: Once | INTRAVENOUS | Status: AC
  Administered 2020-04-02: 11:00:00 5 mg via INTRAVENOUS

## 2020-04-02 MED ORDER — OXYCODONE HCL 5 MG PO TABS: 5.0000 mg | ORAL_TABLET | Freq: Once | ORAL | Status: DC | PRN

## 2020-04-02 MED ORDER — SODIUM CHLORIDE (PF) 0.9 % IJ SOLN
INTRAVENOUS | Status: DC | PRN
  Administered 2020-04-02: 5 mL via INTRAMUSCULAR

## 2020-04-02 MED ORDER — BUPIVACAINE HCL 0.25 % IJ SOLN
INTRAMUSCULAR | Status: DC | PRN
  Administered 2020-04-02: 24 mL

## 2020-04-02 MED ORDER — LIDOCAINE HCL (CARDIAC) PF 100 MG/5ML IV SOSY
PREFILLED_SYRINGE | INTRAVENOUS | Status: DC | PRN
  Administered 2020-04-02: 80 mg via INTRAVENOUS

## 2020-04-02 MED ORDER — CEFAZOLIN SODIUM-DEXTROSE 2-4 GM/100ML-% IV SOLN
INTRAVENOUS | Status: AC
  Filled 2020-04-02: qty 100

## 2020-04-02 MED ORDER — OXYCODONE HCL 5 MG/5ML PO SOLN: 5.0000 mg | Freq: Once | ORAL | Status: DC | PRN

## 2020-04-02 MED ORDER — ONDANSETRON HCL 4 MG/2ML IJ SOLN
INTRAMUSCULAR | Status: DC | PRN
  Administered 2020-04-02: 4 mg via INTRAVENOUS

## 2020-04-02 MED ORDER — PROPOFOL 10 MG/ML IV BOLUS
INTRAVENOUS | Status: DC | PRN
  Administered 2020-04-02: 80 mg via INTRAVENOUS

## 2020-04-02 MED ORDER — BUPIVACAINE HCL (PF) 0.25 % IJ SOLN
INTRAMUSCULAR | Status: DC | PRN
  Administered 2020-04-02 (×2): 30 mL

## 2020-04-02 MED ORDER — AMISULPRIDE (ANTIEMETIC) 5 MG/2ML IV SOLN
INTRAVENOUS | Status: AC
  Filled 2020-04-02: qty 2

## 2020-04-02 MED ORDER — DEXAMETHASONE SODIUM PHOSPHATE 4 MG/ML IJ SOLN
INTRAMUSCULAR | Status: DC | PRN
  Administered 2020-04-02: 4 mg via INTRAVENOUS

## 2020-04-02 MED ORDER — ACETAMINOPHEN 500 MG PO TABS
ORAL_TABLET | ORAL | Status: AC
  Filled 2020-04-02: qty 2

## 2020-04-02 MED ORDER — SODIUM CHLORIDE (PF) 0.9 % IJ SOLN
INTRAMUSCULAR | Status: AC
  Filled 2020-04-02: qty 10

## 2020-04-02 MED ORDER — DEXAMETHASONE SODIUM PHOSPHATE 10 MG/ML IJ SOLN
INTRAMUSCULAR | Status: AC
  Filled 2020-04-02: qty 1

## 2020-04-02 MED ORDER — ONDANSETRON HCL 4 MG/2ML IJ SOLN: 4.0000 mg | Freq: Once | INTRAMUSCULAR | Status: DC | PRN

## 2020-04-02 MED ORDER — FENTANYL CITRATE (PF) 100 MCG/2ML IJ SOLN
25.0000 ug | INTRAMUSCULAR | Status: DC | PRN
  Administered 2020-04-02: 25 ug via INTRAVENOUS

## 2020-04-02 MED ORDER — LIDOCAINE 2% (20 MG/ML) 5 ML SYRINGE
INTRAMUSCULAR | Status: AC
  Filled 2020-04-02: qty 5

## 2020-04-02 MED ORDER — LACTATED RINGERS IV SOLN: INTRAVENOUS | Status: DC

## 2020-04-02 MED ORDER — ACETAMINOPHEN 500 MG PO TABS
1000.0000 mg | ORAL_TABLET | ORAL | Status: AC
  Administered 2020-04-02: 07:00:00 1000 mg via ORAL

## 2020-04-02 MED ORDER — METHYLENE BLUE 0.5 % INJ SOLN
INTRAVENOUS | Status: AC
  Filled 2020-04-02: qty 10

## 2020-04-02 MED ORDER — MIDAZOLAM HCL 2 MG/2ML IJ SOLN
1.0000 mg | INTRAMUSCULAR | Status: DC | PRN
  Administered 2020-04-02: 08:00:00 0.5 mg via INTRAVENOUS

## 2020-04-02 MED ORDER — CEFAZOLIN SODIUM-DEXTROSE 2-4 GM/100ML-% IV SOLN: 2.0000 g | INTRAVENOUS | Status: DC

## 2020-04-02 MED ORDER — FENTANYL CITRATE (PF) 100 MCG/2ML IJ SOLN
50.0000 ug | INTRAMUSCULAR | Status: AC | PRN
  Administered 2020-04-02: 50 ug via INTRAVENOUS
  Administered 2020-04-02 (×2): 25 ug via INTRAVENOUS

## 2020-04-02 MED ORDER — CEFAZOLIN SODIUM-DEXTROSE 2-4 GM/100ML-% IV SOLN
2.0000 g | INTRAVENOUS | Status: AC
  Administered 2020-04-02: 2 g via INTRAVENOUS

## 2020-04-02 SURGICAL SUPPLY — 51 items
ADH SKN CLS APL DERMABOND .7 (GAUZE/BANDAGES/DRESSINGS) ×1
APL PRP STRL LF DISP 70% ISPRP (MISCELLANEOUS) ×1
APPLIER CLIP 9.375 MED OPEN (MISCELLANEOUS) ×2
APR CLP MED 9.3 20 MLT OPN (MISCELLANEOUS) ×1
BINDER BREAST LRG (GAUZE/BANDAGES/DRESSINGS) IMPLANT
BINDER BREAST MEDIUM (GAUZE/BANDAGES/DRESSINGS) IMPLANT
BINDER BREAST XLRG (GAUZE/BANDAGES/DRESSINGS) IMPLANT
BINDER BREAST XXLRG (GAUZE/BANDAGES/DRESSINGS) IMPLANT
BLADE SURG 15 STRL LF DISP TIS (BLADE) ×1 IMPLANT
BLADE SURG 15 STRL SS (BLADE) ×2
CANISTER SUC SOCK COL 7IN (MISCELLANEOUS) IMPLANT
CANISTER SUCT 1200ML W/VALVE (MISCELLANEOUS) ×2 IMPLANT
CHLORAPREP W/TINT 26 (MISCELLANEOUS) ×2 IMPLANT
CLIP APPLIE 9.375 MED OPEN (MISCELLANEOUS) ×1 IMPLANT
COVER BACK TABLE 60X90IN (DRAPES) ×2 IMPLANT
COVER MAYO STAND STRL (DRAPES) ×2 IMPLANT
COVER PROBE W GEL 5X96 (DRAPES) ×2 IMPLANT
DERMABOND ADVANCED (GAUZE/BANDAGES/DRESSINGS) ×1
DERMABOND ADVANCED .7 DNX12 (GAUZE/BANDAGES/DRESSINGS) ×1 IMPLANT
DRAIN CHANNEL 19F RND (DRAIN) ×1 IMPLANT
DRAPE LAPAROSCOPIC ABDOMINAL (DRAPES) ×2 IMPLANT
DRAPE UTILITY XL STRL (DRAPES) ×2 IMPLANT
ELECT COATED BLADE 2.86 ST (ELECTRODE) ×2 IMPLANT
ELECT REM PT RETURN 9FT ADLT (ELECTROSURGICAL) ×2
ELECTRODE REM PT RTRN 9FT ADLT (ELECTROSURGICAL) ×1 IMPLANT
EVACUATOR SILICONE 100CC (DRAIN) ×1 IMPLANT
GLOVE BIOGEL PI IND STRL 8 (GLOVE) ×1 IMPLANT
GLOVE BIOGEL PI INDICATOR 8 (GLOVE) ×1
GLOVE ECLIPSE 8.0 STRL XLNG CF (GLOVE) ×2 IMPLANT
GOWN STRL REUS W/ TWL LRG LVL3 (GOWN DISPOSABLE) ×2 IMPLANT
GOWN STRL REUS W/TWL LRG LVL3 (GOWN DISPOSABLE) ×4
HEMOSTAT ARISTA ABSORB 3G PWDR (HEMOSTASIS) IMPLANT
HEMOSTAT SNOW SURGICEL 2X4 (HEMOSTASIS) IMPLANT
KIT MARKER MARGIN INK (KITS) ×2 IMPLANT
NDL HYPO 25X1 1.5 SAFETY (NEEDLE) ×1 IMPLANT
NDL SAFETY ECLIPSE 18X1.5 (NEEDLE) IMPLANT
NEEDLE HYPO 18GX1.5 SHARP (NEEDLE)
NEEDLE HYPO 25X1 1.5 SAFETY (NEEDLE) ×4 IMPLANT
NS IRRIG 1000ML POUR BTL (IV SOLUTION) ×2 IMPLANT
PENCIL SMOKE EVACUATOR (MISCELLANEOUS) ×2 IMPLANT
SET BASIN DAY SURGERY F.S. (CUSTOM PROCEDURE TRAY) ×2 IMPLANT
SLEEVE SCD COMPRESS KNEE MED (MISCELLANEOUS) ×2 IMPLANT
SPONGE LAP 4X18 RFD (DISPOSABLE) ×5 IMPLANT
SUT ETHILON 2 0 FS 18 (SUTURE) ×1 IMPLANT
SUT MNCRL AB 4-0 PS2 18 (SUTURE) ×3 IMPLANT
SUT VICRYL 3-0 CR8 SH (SUTURE) ×4 IMPLANT
SYR CONTROL 10ML LL (SYRINGE) ×3 IMPLANT
TOWEL GREEN STERILE FF (TOWEL DISPOSABLE) ×2 IMPLANT
TRAY FAXITRON CT DISP (TRAY / TRAY PROCEDURE) ×3 IMPLANT
TUBE CONNECTING 20X1/4 (TUBING) ×2 IMPLANT
YANKAUER SUCT BULB TIP NO VENT (SUCTIONS) ×2 IMPLANT

## 2020-04-02 NOTE — Op Note (Signed)
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  Procedure: Right breast seed localized lumpectomy with right axillary sentinel lymph node mapping using methylene blue dye and targeted right axillary deep lymph node biopsy and left breast lumpectomy seed localized  Surgeon: Erroll Luna, MD  Anesthesia: General with 0.25% Marcaine  EBL: 30 cc  Drains: 19 round drain right axilla  Specimen: Right breast mass with seed and clip right axillary lymph node with seed and clip left breast mass with seed and clip in 3 right axillary sentinel nodes hot and blue  Indications for procedure: The patient is a 79-year-old female who was found to have a right axillary lymph node that was enlarged.  She had a history of colon cancer.  Core biopsy showed adenocarcinoma.  MRI performed showed a 2 cm central right breast mass core biopsy to be invasive ductal carcinoma as well.  She had an area left breast core biopsy which was atypical ductal hyperplasia.  After discussion of surgical options she opted for breast conserving surgery with lateral lumpectomies with removal of the right axillary lymph node using seed localization and right axillary sentinel lymph node mapping.The procedure has been discussed with the patient. Alternatives to surgery have been discussed with the patient.  Risks of surgery include bleeding,  Infection,  Seroma formation, death,  and the need for further surgery.   The patient understands and wishes to proceed.Sentinel lymph node mapping and dissection has been discussed with the patient.  Risk of bleeding,  Infection,  Seroma formation,  Additional procedures,,  Shoulder weakness ,  Shoulder stiffness,  Nerve and blood vessel injury and reaction to the mapping dyes have been discussed.  Alternatives to surgery have been discussed with the  patient.  The patient agrees to proceed.  Description of procedure: The patient was met in the holding area and questions were answered.  She underwent bilateral pectoral blocks per anesthesia.  Seeds were verified using the 5 the neoprobe in the holding area to verify the level 1 left seed and 2 right seeds with 1 being the breast and one axilla.  She underwent technetium sulfur colloid injection of right breast.  Questions were answered.  She was then taken back to the operating.  She is placed supine upon the OR table.  After induction of general esthesia, both breasts were prepped and draped in a sterile fashion and timeout performed.  Neoprobe used left side done first.  Seed localized left lower outer quadrant.  Curvilinear incision was made over this after infiltration of the skin with 0.25% Marcaine.  Dissection was carried down all tissue and the seed and clip were excised with a grossly negative margin.  Faxitron revealed both seed and clip to be in the specimen.  Hemostasis achieved.  Wound closed with 3-0 Vicryl and 4-0 Monocryl.  The right side was then addressed.  4 cc of methylene blue dye were injected in a right subareolar position and massaged.  Neoprobe was used and seed was localized in the central right breast.  Curvilinear incision was made along the medial border of the nipple areolar complex and dissection carried down to excise the entire mass in its entirety down to the pectoralis fascia and the skin anteriorly.  Hemostasis achieved.  Neoprobe was used to verify seed and clip in the specimen and Faxitron revealed both seed and clip to be in the specimen.  Hemostasis achieved with cautery.  Clips placed to mark the cavity and the deep layers were closed with 3-0 Vicryl.  4 Monocryl used to close the skin.  Neoprobe was used to identify the seed in the right axilla and the right axilla lymph node which was level 1.  Incision was made over this.  A very large 4 cm mass was encountered  consistent with the metastatic disease in the lymph node.  This was excised with a grossly negative margin.  Neoprobe was switched technetium.  Identified 3 right axillary sentinel nodes that were excised as well.  Background counts approached 0.  Due to the large nature of the large additional lymph node a 19 round drain was placed into the axilla and secured with 2-0 nylon.  Hemostasis achieved.  Wound closed with 3-0 Vicryl and 4 Monocryl.  Dermabond applied.  All counts were found to be correct.  Breast binder placed.  The patient was awoke extubated taken to recovery in satisfactory condition.

## 2020-04-02 NOTE — Interval H&P Note (Signed)
History and Physical Interval Note:  04/02/2020 7:24 AM  Jessica Taylor  has presented today for surgery, with the diagnosis of RIGHT BREAST CANCER, LEFT BREAST ATYPICAL DUCTAL HYPERPLASIA.  The various methods of treatment have been discussed with the patient and family. After consideration of risks, benefits and other options for treatment, the patient has consented to  Procedure(s) with comments: BILATERAL BREAST LUMPECTOMY WITH RADIOACTIVE SEED AND RIGHT SENTINEL LYMPH NODE BIOPSY AND RIGHT TARGETED AXILLARY LYMPH NODE BIOPSY (Bilateral) - PEC BLOCK as a surgical intervention.  The patient's history has been reviewed, patient examined, no change in status, stable for surgery.  I have reviewed the patient's chart and labs.  Questions were answered to the patient's satisfaction.     [place]

## 2020-04-02 NOTE — Anesthesia Procedure Notes (Signed)
Anesthesia Regional Block: Pectoralis block   Pre-Anesthetic Checklist: ,, timeout performed, Correct Patient, Correct Site, Correct Laterality, Correct Procedure, Correct Position, site marked, Risks and benefits discussed,  Surgical consent,  Pre-op evaluation,  At surgeon's request and post-op pain management  Laterality: Left  Prep: chloraprep       Needles:  Injection technique: Single-shot  Needle Type: Echogenic Stimulator Needle     Needle Length: 10cm  Needle Gauge: 21     Additional Needles:   Procedures:,,,, ultrasound used (permanent image in chart),,,,  Narrative:  Start time: 04/02/2020 8:25 AM End time: 04/02/2020 8:28 AM Injection made incrementally with aspirations every 5 mL.  Performed by: Personally  Anesthesiologist: Lidia Collum, MD  Additional Notes: Monitors applied. Injection made in 5cc increments. No resistance to injection. Good needle visualization. Patient tolerated procedure well.

## 2020-04-02 NOTE — H&P (Signed)
[name]   Location: [facility]  Patient #: [id number]  DOB: [birth year]  Married / Language: English / Race: White  Female  History of Present Illness Patient words: Patient presents for evaluation of a right axillary lymph node that underwent core biopsy secondary to her recent PET scan which showed metastatic adenocarcinoma. She is undergoing treatment for colon cancer and the PET scan showed enhancement in the right axilla. Core biopsy of a right axillary lymph node was done which showed metastatic adenocarcinoma. 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 She is here today to discuss surgical options of her breast cancer and atypical ductal hyperplasia. She is bruised from her multiple biopsies but otherwise in good health. Her daughter is here with her today as well.  79 year old female with a single hypermetabolic low  lying right axillary lymph node on recent PET-CT. Subsequent biopsy  demonstrated metastatic colon carcinoma, in keeping with the  patient's medical history.  LABS: None performed on site.  EXAM:  BILATERAL BREAST MRI WITH AND WITHOUT CONTRAST  TECHNIQUE:  Multiplanar, multisequence MR images of both breasts were obtained  prior to and following the intravenous administration of 7.5 ml of  Gadavist.  Three-dimensional MR images were rendered by post-processing of the  original MR data on an independent workstation. The  three-dimensional MR images were interpreted, and findings are  reported in the following complete MRI report for this study. Three  dimensional images were evaluated at the independent DynaCad  workstation  COMPARISON: Previous exam(s).  FINDINGS:  Breast composition: a. Almost entirely fat.  Background parenchymal  enhancement: Minimal.  Right breast: There is an irregular, enhancing mass in the  subareolar right breast at middle depth (series 10501, image  143/248). It measures 10 x 5 x 8 mm and demonstrates rapid  enhancement and suspicious washout kinetics. No additional  suspicious mass or non-mass enhancement within the remainder of the  right breast.  Left breast: There is a 5 mm enhancing focus in the far posterior  central left breast inferiorly (series 10504, image 163/248). This  demonstrates rapid uptake and suspicious washout. There is no T2  correlate. No additional suspicious findings in the remainder of the  left breast.  Lymph nodes: Enhancing 2 cm mass with associated post biopsy clip is  seen anterior to the right pectoralis muscle. This is consistent  with the patient's biopsy-proven site of malignancy. No other  suspicious axillary or internal mammary chain lymphadenopathy.  Ancillary findings: None.  IMPRESSION:  1. 2 cm enhancing mass anterior to the upper right pectoralis muscle  consistent with the patient's biopsy-proven site of malignancy.  2. Suspicious 1 cm enhancing mass in the subareolar right breast  (image 143/248). Recommendation is for MRI guided biopsy.  3. Indeterminate 5 mm enhancing focus in the posterior central left  breast (image 163/248). Recommendation is for MRI guided biopsy.  RECOMMENDATION:  MRI guided biopsies of the bilateral breasts.  BI-RADS CATEGORY 4: Suspicious.  Electronically Signed  By: Kristopher Oppenheim M.D.  On: 12/25/2019 14:24  SURGICAL PATHOLOGY CASE: [id number] PATIENT: Oretha Milch Surgical Pathology Report Clinical History: hx of colorectal ca with recent PET/CT demonstrating a hypermetabolic right axillary lymph node FINAL MICROSCOPIC DIAGNOSIS: A. LYMPH NODE, RIGHT BREAST / AXILLARY TAIL, NEEDLECORE BIOPSY: - Metastatic carcinoma, consistent with  patient's clinical history of primary colorectal carcinoma. See comment COMMENT:  Immunohistochemical stains show that the metastatic tumor cells are positive for CDX2 and CK20; while they are negative for CK7, GATA3 and GCDFP, consistent with above interpretation. GROSS DESCRIPTION: Received in 2 containers (one with saline and one with formalin) are a total of 6 cores of gray-white to yellow soft to firm tissue which range from 0.5 x 0.1 cm to 1 x 0.1 cm. The cores in saline are submitted in RPMI for possible flow cytometry, with cores in formalin submitted in 1 block for each histology. (Time in formalin 0935 hours and cold ischemic time less than 1 minute) SW 12/05/2019 Final Diagnosis performed by Jaquita Folds, MD. Electronically signed 12/07/2019 Technical component performed at [facility], [place] [address] [place], [place] [address]. Professional component performed at [employer]. [facility], [place] [address] [place], [place] [address]. Immunohistochemistry Technical component (if applicable) was performed at [facility]. [address], [place], [place], [place] [address]. IMMUNOHISTOCHEMISTRY DISCLAIMER (if applicable): Some of these immunohistochemical stains may have been developed and the performance characteristics determine by [facility]. Some may not have been cleared or approved by the U.S. Food and Drug Administration. The FDA has determined that such clearance or approval is not necessary. This test is used for clinical purposes. It should not be regarded as investigational or for research. This laboratory is certified under the [place] (CLIA-88) as qualified to perform high complexity clinical laboratory testing. The controls stained appropriately. Resulting Agency [facility] <epic://[email or web address]&135> Specimen Collected: 12/05/19 09:42 Last Resulted: 12/07/19 10:47 Lab Flowsheet <epic://[email or web address]&136> Order Details <epic://[email or web address]&137> View Encounter  <epic://[email or web address]/?[email or web address]&138> Lab and Collection Details <epic://[email or web address]/?[email or web address]&139> Routing <epic://[email or web address]&140> Result History <epic://OPTION/?[employer] Linked Documents View Image <epic://[email or web address]&142> Diagnosis  1. 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  2. Breast, left, needle core biopsy, posterior central  - ATYPICAL DUCTAL HYPERPLASIA (FOCAL).  - REGION OF CHRONIC INFLAMMATION.  - NO CARCINOMA IDENTIFIED.  Microscopic Comment  1. Immunohistochemistry for E-cadherin will be reported separately.  ADDENDUM: Immunohistochemistry for E-cadherin is negative consistent with a lobular phenotype.  2. The region of chronic inflammation lacks the architectural features of a lymph node.  Results reported to [facility] on 01/03/2020.  Intradepartmental consultation (Dr. Tresa Moore).  Gillie Manners MD  Pathologist, Electronic Signature  (Case signed 01/03/2020)  Diagnosis  1. 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  2. Breast, left, needle core biopsy, posterior central  - ATYPICAL DUCTAL HYPERPLASIA (FOCAL).  - REGION OF CHRONIC INFLAMMATION.  - NO CARCINOMA IDENTIFIED.  The patient is a 79 year old female.  Allergies Emeline Gins, [place]; 01/05/2020 10:07 AM)  Shrimp  Allergies Reconciled  Medication History (Meclizine HCl (12.5MG  Tablet, 1 (one) Oral every eight hours, as needed, Taken starting 10/16/2019) Active.  Gabapentin (100MG  Capsule, 1 (one) Oral every eight hours, as needed, Taken starting 10/16/2019) Active.  Metoprolol Tartrate (25MG  Tablet, Oral) Active.  Levothyroxine Sodium (50MCG Tablet, Oral) Active.  Atorvastatin Calcium (40MG  Tablet, Oral) Active.  Clopidogrel Bisulfate (75MG  Tablet, Oral) Active.  HYDROcodone-Acetaminophen (5-325MG  Tablet, Oral) Active.  Ondansetron (4MG  Tablet Disint, Oral)  Active.  Plavix (75MG  Tablet, Oral) Active.  Medications Reconciled  Vitals Emeline Gins 01/05/2020 10:06 AM  Weight: 148.8 lb Height: 63 in  Body Surface Area: 1.71 m Body Mass Index: 26.36 kg/m  Temp.: 97.5 F Pulse: 61 (Regular)  BP: 140/82 (Sitting, Left Arm, Standard)  Physical Exam General  Mental Status - Alert.  General Appearance - Consistent with stated age.  Hydration - Well hydrated.  Voice - Normal.  Chest and Lung Exam  Chest and lung exam reveals - quiet, even and easy respiratory effort with no use of accessory muscles and on auscultation, normal breath sounds, no adventitious sounds and normal vocal resonance.  Inspection  Chest Wall - Normal. Back - normal.  Breast  Note: Bruising noted bilateral breasts. No evidence of axillary adenopathy bilaterally. No nipple discharge or inversion bilaterally.  Cardiovascular  Cardiovascular examination reveals - on palpation PMI is normal in location and amplitude, no palpable S3 or S4. Normal cardiac borders., normal heart sounds, regular rate and rhythm with no murmurs, carotid auscultation reveals no bruits and normal pedal pulses bilaterally.  Neurologic  Neurologic evaluation reveals - alert and oriented x 3 with no impairment of recent or remote memory.  Mental Status - Normal.  Assessment & Plan BREAST CANCER, RIGHT (C50.911)  Impression: Stage II disease.  His followed by medical oncology and already has an appointment to see them. She has opted for breast conserving surgery which will include right breast lumpectomy with right axillary sentinel lymph node mapping. We will do a seed localized targeted right lymph node biopsy as well. Left breast requires lumpectomy for atypical ductal hyperplasia and this was discussed as well. We discussed different surgical options from breast conserving surgery versus mastectomy with potential reconstruction. We discussed the significance lymph node in her right axilla and this may  actually be from a breast primary once fully evaluated. I am waiting for receptor status and we'll refer her to radiation oncology to discuss postoperative radiation therapy. She is comfortable with breast conserving surgery and we will proceed with the above mentioned plan. Discussed risk of surgery, lymphedema, and postoperative recovery. Risk of lumpectomy include bleeding, infection, seroma, more surgery, use of seed/wire, wound care, cosmetic deformity and the need for other treatments, death , blood clots, death. Pt agrees to proceed. Risk of sentinel lymph node mapping include bleeding, infection, lymphedema, shoulder pain. stiffness, dye allergy. cosmetic deformity , blood clots, death, need for more surgery. Pt agrees to proceed.  Total time 45 minutes discussing her condition, face-to-face examination, review of records, review of pathology, review of imaging, and documentation.  Current Plans  You are being scheduled for surgery - Our schedulers will call you.  You should hear from our office's scheduling department within 5 working days about the location, date, and time of surgery. We try to make accommodations for patient's preferences in scheduling surgery, but sometimes the OR schedule or the surgeon's schedule prevents [place] from making those accommodations.  If you have not heard from our office [phone]) in 5 working days, call the office and ask for your surgeon's nurse.  If you have other questions about your diagnosis, plan, or surgery, call the office and ask for your surgeon's nurse.  Pt Education - [facility] Breast Cancer Information Given - [employer] "Breast Journey" Package  Pt Education - Pamphlet Given - Breast Biopsy: discussed with patient and provided information.  We discussed the staging and pathophysiology of breast cancer. We discussed all of the different options for treatment for breast cancer including surgery, chemotherapy, radiation therapy, Herceptin, and antiestrogen  therapy.  We discussed a sentinel lymph node biopsy as she does not appear to having lymph node involvement right now. We discussed the  performance of that with injection of radioactive tracer and blue dye. We discussed that she would have an incision underneath her axillary hairline. We discussed that there is a bout a 10-20% chance of having a positive node with a sentinel lymph node biopsy and we will await the permanent pathology to make any other first further decisions in terms of her treatment. One of these options might be to return to the operating room to perform an axillary lymph node dissection. We discussed about a 1-2% risk lifetime of chronic shoulder pain as well as lymphedema associated with a sentinel lymph node biopsy.  We discussed the options for treatment of the breast cancer which included lumpectomy versus a mastectomy. We discussed the performance of the lumpectomy with a wire placement. We discussed a 10-20% chance of a positive margin requiring reexcision in the operating room. We also discussed that she may need radiation therapy or antiestrogen therapy or both if she undergoes lumpectomy. We discussed the mastectomy and the postoperative care for that as well. We discussed that there is no difference in her survival whether she undergoes lumpectomy with radiation therapy or antiestrogen therapy versus a mastectomy. There is a slight difference in the local recurrence rate being 3-5% with lumpectomy and about 1% with a mastectomy.  We discussed the risks of operation including bleeding, infection, possible reoperation. She understands her further therapy will be based on what her stages at the time of her operation.  Pt Education - flb breast cancer surgery: discussed with patient and provided information.  Pt Education - [facility] Breast Biopsy HCI: discussed with patient and provided information.  Pt Education - ABC (After Breast Cancer) Class Info: discussed with patient and provided  information.  Pt Education - [facility] Breast Pains Education  ATYPICAL DUCTAL HYPERPLASIA OF LEFT BREAST (N60.92)

## 2020-04-02 NOTE — Transfer of Care (Signed)
Immediate Anesthesia Transfer of Care Note  Patient: CANDICE TOBEY  Procedure(s) Performed: BILATERAL BREAST LUMPECTOMY WITH RADIOACTIVE SEED AND RIGHT SENTINEL LYMPH NODE BIOPSY AND RIGHT TARGETED AXILLARY LYMPH NODE BIOPSY (Bilateral Breast)  Patient Location: PACU  Anesthesia Type:GA combined with regional for post-op pain  Level of Consciousness: sedated  Airway & Oxygen Therapy: Patient Spontanous Breathing and Patient connected to face mask oxygen  Post-op Assessment: Report given to RN and Post -op Vital signs reviewed and stable  Post vital signs: Reviewed and stable  Last Vitals:  Vitals Value Taken Time  BP    Temp    Pulse 58 04/02/20 1035  Resp 14 04/02/20 1035  SpO2 100 % 04/02/20 1035  Vitals shown include unvalidated device data.  Last Pain:  Vitals:   04/02/20 0635  TempSrc: Tympanic  PainSc: 0-No pain      Patients Stated Pain Goal: 2 (32/99/24 2683)  Complications: No apparent anesthesia complications

## 2020-04-02 NOTE — Anesthesia Postprocedure Evaluation (Signed)
Anesthesia Post Note  Patient: Jessica Taylor  Procedure(s) Performed: BILATERAL BREAST LUMPECTOMY WITH RADIOACTIVE SEED AND RIGHT SENTINEL LYMPH NODE BIOPSY AND RIGHT TARGETED AXILLARY LYMPH NODE BIOPSY (Bilateral Breast)     Patient location during evaluation: PACU Anesthesia Type: General Level of consciousness: awake and alert Pain management: pain level controlled Vital Signs Assessment: post-procedure vital signs reviewed and stable Respiratory status: spontaneous breathing, nonlabored ventilation and respiratory function stable Cardiovascular status: blood pressure returned to baseline and stable Postop Assessment: no apparent nausea or vomiting Anesthetic complications: no    Last Vitals:  Vitals:   04/02/20 1200 04/02/20 1313  BP: (!) 178/65 (!) 184/74  Pulse: 60 60  Resp: 14 16  Temp:  36.5 C  SpO2: 98% 96%    Last Pain:  Vitals:   04/02/20 1313  TempSrc:   PainSc: 2                  Lidia Collum

## 2020-04-02 NOTE — Progress Notes (Signed)
Assisted Dr. Witman with right, left, ultrasound guided, pectoralis block. Side rails up, monitors on throughout procedure. See vital signs in flow sheet. Tolerated Procedure well. 

## 2020-04-02 NOTE — Anesthesia Procedure Notes (Signed)
Procedure Name: LMA Insertion Date/Time: 04/02/2020 8:51 AM Performed by: Maryella Shivers, CRNA Pre-anesthesia Checklist: Patient identified, Emergency Drugs available, Suction available and Patient being monitored Patient Re-evaluated:Patient Re-evaluated prior to induction Oxygen Delivery Method: Circle system utilized Preoxygenation: Pre-oxygenation with 100% oxygen Induction Type: IV induction Ventilation: Mask ventilation without difficulty LMA: LMA inserted LMA Size: 4.0 Number of attempts: 1 Airway Equipment and Method: Bite block Placement Confirmation: positive ETCO2 Tube secured with: Tape Dental Injury: Teeth and Oropharynx as per pre-operative assessment

## 2020-04-02 NOTE — Discharge Instructions (Signed)
No Tylenol until 1:00pm if needed.     [place] Office Phone Number [phone]  BREAST BIOPSY/ PARTIAL MASTECTOMY: POST OP INSTRUCTIONS  Always review your discharge instruction sheet given to you by the facility where your surgery was performed.  IF YOU HAVE DISABILITY OR FAMILY LEAVE FORMS, YOU MUST BRING THEM TO THE OFFICE FOR PROCESSING.  DO NOT GIVE THEM TO YOUR DOCTOR.  1. A prescription for pain medication may be given to you upon discharge.  Take your pain medication as prescribed, if needed.  If narcotic pain medicine is not needed, then you may take acetaminophen (Tylenol) or ibuprofen (Advil) as needed. 2. Take your usually prescribed medications unless otherwise directed 3. If you need a refill on your pain medication, please contact your pharmacy.  They will contact our office to request authorization.  Prescriptions will not be filled after 5pm or on week-ends. 4. You should eat very light the first 24 hours after surgery, such as soup, crackers, pudding, etc.  Resume your normal diet the day after surgery. 5. Most patients will experience some swelling and bruising in the breast.  Ice packs and a good support bra will help.  Swelling and bruising can take several days to resolve.  6. It is common to experience some constipation if taking pain medication after surgery.  Increasing fluid intake and taking a stool softener will usually help or prevent this problem from occurring.  A mild laxative (Milk of Magnesia or Miralax) should be taken according to package directions if there are no bowel movements after 48 hours. 7. Unless discharge instructions indicate otherwise, you may remove your bandages 24-48 hours after surgery, and you may shower at that time.  You may have steri-strips (small skin tapes) in place directly over the incision.  These strips should be left on the skin for 7-10 days.  If your surgeon used skin glue on the incision, you may shower in 24  hours.  The glue will flake off over the next 2-3 weeks.  Any sutures or staples will be removed at the office during your follow-up visit. 8. ACTIVITIES:  You may resume regular daily activities (gradually increasing) beginning the next day.  Wearing a good support bra or sports bra minimizes pain and swelling.  You may have sexual intercourse when it is comfortable. a. You may drive when you no longer are taking prescription pain medication, you can comfortably wear a seatbelt, and you can safely maneuver your car and apply brakes. b. RETURN TO WORK:  ______________________________________________________________________________________ 9. You should see your doctor in the office for a follow-up appointment approximately two weeks after your surgery.  Your doctor's nurse will typically make your follow-up appointment when she calls you with your pathology report.  Expect your pathology report 2-3 business days after your surgery.  You may call to check if you do not hear from [place] after three days. 10. OTHER INSTRUCTIONS: _______________________________________________________________________________________________ _____________________________________________________________________________________________________________________________________ _____________________________________________________________________________________________________________________________________ _____________________________________________________________________________________________________________________________________  WHEN TO CALL YOUR DOCTOR: 1. Fever over 101.0 2. Nausea and/or vomiting. 3. Extreme swelling or bruising. 4. Continued bleeding from incision. 5. Increased pain, redness, or drainage from the incision.  The clinic staff is available to answer your questions during regular business hours.  Please don't hesitate to call and ask to speak to one of the nurses for clinical concerns.  If you have a  medical emergency, go to the nearest emergency room or call 911.  A surgeon from [facility] is always on call at  the hospital.  For further questions, please visit [email or web address]   Surgical [facility] Surgical drains are used to remove extra fluid that normally builds up in a surgical wound after surgery. A surgical drain helps to heal a surgical wound. Different kinds of surgical drains include:  Active drains. These drains use suction to pull drainage away from the surgical wound. Drainage flows through a tube to a container outside of the body. With these drains, you need to keep the bulb or the drainage container flat (compressed) at all times, except while you empty it. Flattening the bulb or container creates suction.  Passive drains. These drains allow fluid to drain naturally, by gravity. Drainage flows through a tube to a bandage (dressing) or a container outside of the body. Passive drains do not need to be emptied. A drain is placed during surgery. Right after surgery, drainage is usually bright red and a little thicker than water. The drainage may gradually turn yellow or pink and become thinner. It is likely that your health care provider will remove the drain when the drainage stops or when the amount decreases to 1-2 Tbsp (15-30 mL) during a 24-hour period. Supplies needed:  Tape.  Germ-free cleaning solution (sterile saline).  Cotton swabs.  Split gauze drain sponge: 4 x 4 inches (10 x 10 cm).  Gauze square: 4 x 4 inches (10 x 10 cm). How to care for your surgical drain Care for your drain as told by your health care provider. This is important to help prevent infection. If your drain is placed at your back, or any other hard-to-reach area, ask another person to assist you in performing the following tasks: General care  Keep the skin around the drain dry and covered with a dressing at all times.  Check your drain area every day for signs of  infection. Check for: ? Redness, swelling, or pain. ? Pus or a bad smell. ? Cloudy drainage. ? Tenderness or pressure at the drain exit site. Changing the dressing Follow instructions from your health care provider about how to change your dressing. Change your dressing at least once a day. Change it more often if needed to keep the dressing dry. Make sure you: 1. Gather your supplies. 2. Wash your hands with soap and water before you change your dressing. If soap and water are not available, use hand sanitizer. 3. Remove the old dressing. Avoid using scissors to do that. 4. Wash your hands with soap and water again after removing the old dressing. 5. Use sterile saline to clean your skin around the drain. You may need to use a cotton swab to clean the skin. 6. Place the tube through the slit in a drain sponge. Place the drain sponge so that it covers your wound. 7. Place the gauze square or another drain sponge on top of the drain sponge that is on the wound. Make sure the tube is between those layers. 8. Tape the dressing to your skin. 9. Tape the drainage tube to your skin 1-2 inches (2.5-5 cm) below the place where the tube enters your body. Taping keeps the tube from pulling on any stitches (sutures) that you have. 10. Wash your hands with soap and water. 11. Write down the color of your drainage and how often you change your dressing. How to empty your active drain  1. Make sure that you have a measuring cup that you can empty your drainage into. 2. Wash your hands with soap and water.  If soap and water are not available, use hand sanitizer. 3. Loosen any pins or clips that hold the tube in place. 4. If your health care provider tells you to strip the tube to prevent clots and tube blockages: ? Hold the tube at the skin with one hand. Use your other hand to pinch the tubing with your thumb and first finger. ? Gently move your fingers down the tube while squeezing very lightly. This  clears any drainage, clots, or tissue from the tube. ? You may need to do this several times each day to keep the tube clear. Do not pull on the tube. 5. Open the bulb cap or the drain plug. Do not touch the inside of the cap or the bottom of the plug. 6. Turn the device upside down and gently squeeze. 7. Empty all of the drainage into the measuring cup. 8. Compress the bulb or the container and replace the cap or the plug. To compress the bulb or the container, squeeze it firmly in the middle while you close the cap or plug the container. 9. Write down the amount of drainage that you have in each 24-hour period. If you have less than 2 Tbsp (30 mL) of drainage during 24 hours, contact your health care provider. 10. Flush the drainage down the toilet. 11. Wash your hands with soap and water. Contact a health care provider if:  You have redness, swelling, or pain around your drain area.  You have pus or a bad smell coming from your drain area.  You have a fever or chills.  The skin around your drain is warm to the touch.  The amount of drainage that you have is increasing instead of decreasing.  You have drainage that is cloudy.  There is a sudden stop or a sudden decrease in the amount of drainage that you have.  Your drain tube falls out.  Your active drain does not stay compressed after you empty it. Summary  Surgical drains are used to remove extra fluid that normally builds up in a surgical wound after surgery.  Different kinds of surgical drains include active drains and passive drains. Active drains use suction to pull drainage away from the surgical wound, and passive drains allow fluid to drain naturally.  It is important to care for your drain to prevent infection. If your drain is placed at your back, or any other hard-to-reach area, ask another person to assist you.  Contact your health care provider if you have redness, swelling, or pain around your drain area. This  information is not intended to replace advice given to you by your health care provider. Make sure you discuss any questions you have with your health care provider. Document Revised: 12/14/2018 Document Reviewed: 12/14/2018 Elsevier Patient Education  [place].  About my Jackson-Pratt Bulb Drain  What is a Jackson-Pratt bulb? A Jackson-Pratt is a soft, round device used to collect drainage. It is connected to a long, thin drainage catheter, which is held in place by one or two small stiches near your surgical incision site. When the bulb is squeezed, it forms a vacuum, forcing the drainage to empty into the bulb.  Emptying the Jackson-Pratt bulb- To empty the bulb: 1. Release the plug on the top of the bulb. 2. Pour the bulb's contents into a measuring container which your nurse will provide. 3. Record the time emptied and amount of drainage. Empty the drain(s) as often as your  doctor or nurse recommends.  Date                  Time                    Amount (Drain 1)                 Amount (Drain 2)  _____________________________________________________________________  _____________________________________________________________________  _____________________________________________________________________  _____________________________________________________________________  _____________________________________________________________________  _____________________________________________________________________  _____________________________________________________________________  _____________________________________________________________________  Squeezing the Jackson-Pratt Bulb- To squeeze the bulb: 1. Make sure the plug at the top of the bulb is open. 2. Squeeze the bulb tightly in your fist. You will hear air squeezing from the bulb. 3. Replace the plug while the bulb is squeezed. 4. Use a safety pin to attach the bulb to your clothing. This will  keep the catheter from     pulling at the bulb insertion site.  When to call your doctor- Call your doctor if:  Drain site becomes red, swollen or hot.  You have a fever greater than 101 degrees F.  There is oozing at the drain site.  Drain falls out (apply a guaze bandage over the drain hole and secure it with tape).  Drainage increases daily not related to activity patterns. (You will usually have more drainage when you are active than when you are resting.)  Drainage has a bad odor.   Post Anesthesia Home Care Instructions  Activity: Get plenty of rest for the remainder of the day. A responsible individual must stay with you for 24 hours following the procedure.  For the next 24 hours, DO NOT: -Drive a car -Paediatric nurse -Drink alcoholic beverages -Take any medication unless instructed by your physician -Make any legal decisions or sign important papers.  Meals: Start with liquid foods such as gelatin or soup. Progress to regular foods as tolerated. Avoid greasy, spicy, heavy foods. If nausea and/or vomiting occur, drink only clear liquids until the nausea and/or vomiting subsides. Call your physician if vomiting continues.  Special Instructions/Symptoms: Your throat may feel dry or sore from the anesthesia or the breathing tube placed in your throat during surgery. If this causes discomfort, gargle with warm salt water. The discomfort should disappear within 24 hours.  If you had a scopolamine patch placed behind your ear for the management of post- operative nausea and/or vomiting:  1. The medication in the patch is effective for 72 hours, after which it should be removed.  Wrap patch in a tissue and discard in the trash. Wash hands thoroughly with soap and water. 2. You may remove the patch earlier than 72 hours if you experience unpleasant side effects which may include dry mouth, dizziness or visual disturbances. 3. Avoid touching the patch. Wash your hands with  soap and water after contact with the patch.

## 2020-04-02 NOTE — Anesthesia Procedure Notes (Signed)
Anesthesia Regional Block: Pectoralis block   Pre-Anesthetic Checklist: ,, timeout performed, Correct Patient, Correct Site, Correct Laterality, Correct Procedure, Correct Position, site marked, Risks and benefits discussed,  Surgical consent,  Pre-op evaluation,  At surgeon's request and post-op pain management  Laterality: Right  Prep: chloraprep       Needles:  Injection technique: Single-shot  Needle Type: Echogenic Stimulator Needle     Needle Length: 10cm  Needle Gauge: 21     Additional Needles:   Procedures:,,,, ultrasound used (permanent image in chart),,,,  Narrative:  Start time: 04/02/2020 8:22 AM End time: 04/02/2020 8:25 AM Injection made incrementally with aspirations every 5 mL.  Performed by: Personally  Anesthesiologist: Lidia Collum, MD  Additional Notes: Monitors applied. Injection made in 5cc increments. No resistance to injection. Good needle visualization. Patient tolerated procedure well.

## 2020-04-02 NOTE — Anesthesia Preprocedure Evaluation (Signed)
Anesthesia Evaluation  Patient identified by MRN, date of birth, ID band Patient awake    Reviewed: Allergy & Precautions, NPO status , Patient's Chart, lab work & pertinent test results, reviewed documented beta blocker date and time   History of Anesthesia Complications Negative for: history of anesthetic complications  Airway Mallampati: II  TM Distance: >3 FB Neck ROM: Full    Dental  (+) Teeth Intact   Pulmonary neg pulmonary ROS, former smoker,    Pulmonary exam normal        Cardiovascular hypertension, Pt. on medications and Pt. on home beta blockers + CAD, + Past MI, + Cardiac Stents, + CABG (2017), + Peripheral Vascular Disease and +CHF  Normal cardiovascular exam     Neuro/Psych negative neurological ROS  negative psych ROS   GI/Hepatic Neg liver ROS, GERD  ,Colon cancer s/p resection Nov 2020   Endo/Other  Hypothyroidism   Renal/GU Renal InsufficiencyRenal disease  negative genitourinary   Musculoskeletal negative musculoskeletal ROS (+)   Abdominal   Peds  Hematology  (+) anemia ,   Anesthesia Other Findings  Breast cancer   Intermediate risk nuclear perfusion study 09/01/2019.  Subsequent cardiac cath 09/12/2019, per results, "Patient has been maintained on long-term dual antiplatelet therapy with aspirin and Plavix.  All grafts are widely patent.  Medical therapy for concomitant native CAD.  Continue aggressive lipid-lowering therapy with target LDL less than 70.  Clearance will be given for the patient's future robotic colectomy but she will need to hold Plavix for at least 5 days prior to the procedure."   EF by cath 50%, EF by stress test 30-44%   Reproductive/Obstetrics                             Anesthesia Physical  Anesthesia Plan  ASA: III  Anesthesia Plan: General   Post-op Pain Management: GA combined w/ Regional for post-op pain   Induction:  Intravenous  PONV Risk Score and Plan: 3 and Ondansetron, Dexamethasone and Treatment may vary due to age or medical condition  Airway Management Planned: LMA  Additional Equipment: None  Intra-op Plan:   Post-operative Plan: Extubation in OR  Informed Consent: I have reviewed the patients History and Physical, chart, labs and discussed the procedure including the risks, benefits and alternatives for the proposed anesthesia with the patient or authorized representative who has indicated his/her understanding and acceptance.     Dental advisory given  Plan Discussed with:   Anesthesia Plan Comments: (See PAT note 10/10/2019, Konrad Felix, PA-C)       Anesthesia Quick Evaluation

## 2020-04-03 ENCOUNTER — Encounter: Payer: Self-pay | Admitting: *Deleted

## 2020-04-04 LAB — SURGICAL PATHOLOGY

## 2020-04-23 ENCOUNTER — Encounter (HOSPITAL_COMMUNITY): Payer: Self-pay | Admitting: Hematology

## 2020-04-23 ENCOUNTER — Other Ambulatory Visit: Payer: Self-pay

## 2020-04-23 ENCOUNTER — Inpatient Hospital Stay (HOSPITAL_COMMUNITY): Payer: Medicare Other | Attending: Hematology | Admitting: Hematology

## 2020-04-23 VITALS — BP 141/59 | HR 58 | Temp 98.2°F | Resp 16 | Wt 156.2 lb

## 2020-04-23 DIAGNOSIS — I739 Peripheral vascular disease, unspecified: Secondary | ICD-10-CM | POA: Diagnosis not present

## 2020-04-23 DIAGNOSIS — E7800 Pure hypercholesterolemia, unspecified: Secondary | ICD-10-CM | POA: Diagnosis not present

## 2020-04-23 DIAGNOSIS — Z170 Estrogen receptor positive status [ER+]: Secondary | ICD-10-CM | POA: Diagnosis not present

## 2020-04-23 DIAGNOSIS — R5383 Other fatigue: Secondary | ICD-10-CM | POA: Insufficient documentation

## 2020-04-23 DIAGNOSIS — M1990 Unspecified osteoarthritis, unspecified site: Secondary | ICD-10-CM | POA: Diagnosis not present

## 2020-04-23 DIAGNOSIS — Z9049 Acquired absence of other specified parts of digestive tract: Secondary | ICD-10-CM | POA: Insufficient documentation

## 2020-04-23 DIAGNOSIS — K219 Gastro-esophageal reflux disease without esophagitis: Secondary | ICD-10-CM | POA: Insufficient documentation

## 2020-04-23 DIAGNOSIS — K5730 Diverticulosis of large intestine without perforation or abscess without bleeding: Secondary | ICD-10-CM | POA: Insufficient documentation

## 2020-04-23 DIAGNOSIS — D649 Anemia, unspecified: Secondary | ICD-10-CM | POA: Diagnosis not present

## 2020-04-23 DIAGNOSIS — Z7982 Long term (current) use of aspirin: Secondary | ICD-10-CM | POA: Diagnosis not present

## 2020-04-23 DIAGNOSIS — R197 Diarrhea, unspecified: Secondary | ICD-10-CM | POA: Diagnosis not present

## 2020-04-23 DIAGNOSIS — R319 Hematuria, unspecified: Secondary | ICD-10-CM | POA: Diagnosis not present

## 2020-04-23 DIAGNOSIS — Z85828 Personal history of other malignant neoplasm of skin: Secondary | ICD-10-CM | POA: Insufficient documentation

## 2020-04-23 DIAGNOSIS — Z79899 Other long term (current) drug therapy: Secondary | ICD-10-CM | POA: Insufficient documentation

## 2020-04-23 DIAGNOSIS — C50911 Malignant neoplasm of unspecified site of right female breast: Secondary | ICD-10-CM | POA: Insufficient documentation

## 2020-04-23 DIAGNOSIS — Z5112 Encounter for antineoplastic immunotherapy: Secondary | ICD-10-CM | POA: Insufficient documentation

## 2020-04-23 DIAGNOSIS — I10 Essential (primary) hypertension: Secondary | ICD-10-CM | POA: Insufficient documentation

## 2020-04-23 DIAGNOSIS — Z87891 Personal history of nicotine dependence: Secondary | ICD-10-CM | POA: Insufficient documentation

## 2020-04-23 DIAGNOSIS — E039 Hypothyroidism, unspecified: Secondary | ICD-10-CM | POA: Insufficient documentation

## 2020-04-23 DIAGNOSIS — C182 Malignant neoplasm of ascending colon: Secondary | ICD-10-CM | POA: Diagnosis not present

## 2020-04-23 DIAGNOSIS — I2510 Atherosclerotic heart disease of native coronary artery without angina pectoris: Secondary | ICD-10-CM | POA: Diagnosis not present

## 2020-04-23 NOTE — Progress Notes (Signed)
[place], [place] [address]   CLINIC:  Medical Oncology/Hematology  PCP:  Antony Contras, MD [address] / [place] [address] [phone]   REASON FOR VISIT:  Follow-up for stage I breast cancer and colon cancer  PRIOR THERAPY:  1. Robotic proximal colectomy 10/11/2019  2.  Right breast lumpectomy and SLNB on 04/02/2020.  NGS Results: MSI-high, foundation 1 not sent.  CURRENT THERAPY: TBD  BRIEF ONCOLOGIC HISTORY:  Oncology History  Cancer of ascending colon s/p robotic proximal colectomy 10/11/2019  10/11/2019 Initial Diagnosis   Cancer of ascending colon s/p robotic proximal colectomy 10/11/2019   10/23/2019 Cancer Staging   Staging form: Colon and Rectum, AJCC 8th Edition - Clinical stage from 10/23/2019: Stage IVA (cT3, cN1c, cM1a) - Signed by Derek Jack, MD on 12/14/2019     CANCER STAGING: Cancer Staging Cancer of ascending colon s/p robotic proximal colectomy 10/11/2019 Staging form: Colon and Rectum, AJCC 8th Edition - Clinical stage from 10/23/2019: Stage IVA (cT3, cN1c, cM1a) - Signed by Derek Jack, MD on 12/14/2019   INTERVAL HISTORY:  [name], a 79 y.o. female, returns for routine follow-up of her Right stage I estrogen positive breast cancer and metastatic colon cancer. Jessica Taylor was last seen on 01/04/2020.   Her R axillary incision is healing nicely. She reports having hair falling out. She reports very little fatigue.  No new onset pains.  Appetite is 100%.  Energy levels are 75%.  Has occasional diarrhea since surgery which is stable.   REVIEW OF SYSTEMS:  Review of Systems  Constitutional: Positive for fatigue (Mildly). Negative for appetite change.  Gastrointestinal: Positive for diarrhea (occasional).  Genitourinary: Positive for hematuria.   All other systems reviewed and are negative.   PAST MEDICAL/SURGICAL HISTORY:  Past Medical History:  Diagnosis Date   . Abdominal aortic aneurysm ([facility])   . Acid reflux   . Anemia   . Arthritis    knees , R shoulder - tx /w injection - 11/2014  . Basal cell carcinoma (BCC) of dorsum of nose 2010   Resolved  . Cancer ([facility])    basal cell on nose  . Colon cancer ([facility])   . Coronary artery disease   . Hypercholesteremia   . Hypertension   . Hypothyroidism   . Lt Acute pyelonephritis 09/11/2018  . MI, old 86  . Peripheral arterial disease ([facility])    hhigh-grade ostial bilateral calcified iliac stenosis with claudication  . Tobacco abuse   . Vertigo    when lays on left side.   Past Surgical History:  Procedure Laterality Date  . BREAST LUMPECTOMY WITH RADIOACTIVE SEED AND SENTINEL LYMPH NODE BIOPSY Bilateral 04/02/2020   Procedure: BILATERAL BREAST LUMPECTOMY WITH RADIOACTIVE SEED AND RIGHT SENTINEL LYMPH NODE BIOPSY AND RIGHT TARGETED AXILLARY LYMPH NODE BIOPSY;  Surgeon: Erroll Luna, MD;  [facility];  Service: General;  Laterality: Bilateral;  . CARDIAC CATHETERIZATION  5 stents  . CARDIAC CATHETERIZATION N/A 01/20/2016   Procedure: Left Heart Cath and Coronary Angiography;  Surgeon: Peter M Martinique, MD;  Location: [facility];  Service: Cardiovascular;  Laterality: N/A;  . CATARACT EXTRACTION W/PHACO Left 05/24/2014   Procedure: CATARACT EXTRACTION PHACO AND INTRAOCULAR LENS PLACEMENT (IOC);  Surgeon: Tonny Branch, MD;  Location: [facility];  Service: Ophthalmology;  Laterality: Left;  CDE:  9.30  . CATARACT EXTRACTION W/PHACO Right 06/18/2014   Procedure: CATARACT EXTRACTION PHACO AND INTRAOCULAR LENS  PLACEMENT RIGHT EYE CDE=10.84;  Surgeon: Tonny Branch, MD;  Location: [facility];  Service: Ophthalmology;  Laterality: Right;  . CORONARY ARTERY BYPASS GRAFT N/A 01/24/2016   Procedure: CORONARY ARTERY BYPASS GRAFTING (CABG);  Surgeon: Gaye Pollack, MD;  Location: [facility];  Service: Open Heart Surgery;  Laterality: N/A;  . CORONARY STENT PLACEMENT    . CORONARY STENT PLACEMENT   03/06/15   CFX DES  . CYSTOSCOPY/URETEROSCOPY/HOLMIUM LASER/STENT PLACEMENT Left 09/12/2018   Procedure: CYSTOSCOPY/URETEROSCOPY/STENT PLACEMENT;  Surgeon: Ceasar Mons, MD;  Location: [facility];  Service: Urology;  Laterality: Left;  . EYE SURGERY    . LEFT HEART CATH AND CORS/GRAFTS ANGIOGRAPHY N/A 09/12/2019   Procedure: LEFT HEART CATH AND CORS/GRAFTS ANGIOGRAPHY;  Surgeon: Troy Sine, MD;  Location: [facility];  Service: Cardiovascular;  Laterality: N/A;  . LEFT HEART CATHETERIZATION WITH CORONARY ANGIOGRAM N/A 03/06/2015   Procedure: LEFT HEART CATHETERIZATION WITH CORONARY ANGIOGRAM;  Surgeon: Lorretta Harp, MD;  [facility];  Service: Cardiovascular;  Laterality: N/A;  . PARTIAL KNEE ARTHROPLASTY Right 02/04/2015   Procedure: UNICOMPARTMENTAL KNEE;  Surgeon: Dorna Leitz, MD;  Location: [facility];  Service: Orthopedics;  Laterality: Right;  . PERIPHERAL VASCULAR CATHETERIZATION Bilateral 05/13/2015   Procedure: Lower Extremity Angiography;  Surgeon: Lorretta Harp, MD;  Location: [facility];  Service: Cardiovascular;  Laterality: Bilateral;  . PERIPHERAL VASCULAR CATHETERIZATION N/A 05/13/2015   Procedure: Abdominal Aortogram;  Surgeon: Lorretta Harp, MD;  Location: [facility];  Service: Cardiovascular;  Laterality: N/A;  . PERIPHERAL VASCULAR CATHETERIZATION Bilateral 06/27/2015   Procedure: Peripheral Vascular Intervention;  Surgeon: Lorretta Harp, MD;  Location: [facility];  Service: Cardiovascular;  Laterality: Bilateral;  ILIACS  . PERIPHERAL VASCULAR CATHETERIZATION Bilateral 06/27/2015   Procedure: Peripheral Vascular Atherectomy;  Surgeon: Lorretta Harp, MD;  Location: [facility];  Service: Cardiovascular;  Laterality: Bilateral;  . TEE WITHOUT CARDIOVERSION N/A 01/24/2016   Procedure: TRANSESOPHAGEAL ECHOCARDIOGRAM (TEE);  Surgeon: Gaye Pollack, MD;  Location: [facility];  Service: Open Heart Surgery;  Laterality: N/A;    . TONSILLECTOMY     age 27  . TUBAL LIGATION      SOCIAL HISTORY:  Social History   Socioeconomic History  . Marital status: Married    Spouse name: [name]  . Number of children: 4  . Years of education: Not on file  . Highest education level: Not on file  Occupational History  . Occupation: retired    Comment: worked as Presenter, broadcasting for [employer]  Tobacco Use  . Smoking status: Former Smoker    Packs/day: 1.50    Years: 42.00    Pack years: 63.00    Types: Cigarettes    Quit date: 05/19/1999    Years since quitting: 20.9  . Smokeless tobacco: Never Used  Substance and Sexual Activity  . Alcohol use: No  . Drug use: No  . Sexual activity: Yes    Birth control/protection: Surgical  Other Topics Concern  . Not on file  Social History Narrative  . Not on file   Social Determinants of Health   Financial Resource Strain:   . Difficulty of Paying Living Expenses:   Food Insecurity:   . Worried About Charity fundraiser in the Last Year:   . Arboriculturist in the Last Year:   Transportation Needs:   . Film/video editor (Medical):   Marland Kitchen Lack of Transportation (Non-Medical):  Physical Activity:   . Days of Exercise per Week:   . Minutes of Exercise per Session:   Stress:   . Feeling of Stress :   Social Connections:   . Frequency of Communication with Friends and Family:   . Frequency of Social Gatherings with Friends and Family:   . Attends Religious Services:   . Active Member of Clubs or Organizations:   . Attends Archivist Meetings:   Marland Kitchen Marital Status:   Intimate Partner Violence:   . Fear of Current or Ex-Partner:   . Emotionally Abused:   Marland Kitchen Physically Abused:   . Sexually Abused:     FAMILY HISTORY:  Family History  Problem Relation Age of Onset  . Ovarian cancer Mother 59  . Cancer Father 73       unsure of which kind, "it was in his glands"  . Hypertension Maternal Grandmother   . Stroke Maternal Grandfather   .  Hypertension Son   . Brain cancer Sister   . Hypertension Daughter   . Heart attack Neg Hx   . Colon cancer Neg Hx   . Esophageal cancer Neg Hx   . Inflammatory bowel disease Neg Hx   . Liver disease Neg Hx   . Pancreatic cancer Neg Hx   . Rectal cancer Neg Hx   . Stomach cancer Neg Hx     CURRENT MEDICATIONS:  Current Outpatient Medications  Medication Sig Dispense Refill  . aspirin EC 81 MG tablet Take 81 mg by mouth every evening.     Marland Kitchen atorvastatin (LIPITOR) 40 MG tablet TAKE (1) TABLET BY MOUTH ONCE DAILY. 90 tablet 3  . clopidogrel (PLAVIX) 75 MG tablet TAKE (1) TABLET BY MOUTH ONCE DAILY. (Patient taking differently: Take 75 mg by mouth daily. ) 90 tablet 3  . esomeprazole (NEXIUM) 20 MG capsule Take 20 mg by mouth daily.     Marland Kitchen gabapentin (NEURONTIN) 100 MG capsule Take 200 mg by mouth every 8 (eight) hours as needed.    Marland Kitchen HYDROcodone-acetaminophen (NORCO/VICODIN) 5-325 MG tablet Take 1 tablet by mouth every 6 (six) hours as needed for moderate pain or severe pain. 30 tablet 0  . HYDROcodone-acetaminophen (NORCO/VICODIN) 5-325 MG tablet Take 1 tablet by mouth every 6 (six) hours as needed for moderate pain. 15 tablet 0  . levothyroxine (SYNTHROID, LEVOTHROID) 50 MCG tablet Take 50 mcg by mouth daily before breakfast.    . meclizine (ANTIVERT) 12.5 MG tablet Take 12.5 mg by mouth every 8 (eight) hours as needed.    . metoprolol tartrate (LOPRESSOR) 25 MG tablet Take 1 tablet (25 mg total) by mouth 2 (two) times daily. 180 tablet 3  . nitroGLYCERIN (NITROSTAT) 0.4 MG SL tablet Place 1 tablet (0.4 mg total) under the tongue every 5 (five) minutes as needed for chest pain. (Patient not taking: Reported on 11/15/2019) 25 tablet prn   No current facility-administered medications for this visit.    ALLERGIES:  Allergies  Allergen Reactions  . Shrimp [Shellfish Allergy] Nausea And Vomiting    Throws up violently    PHYSICAL EXAM:  Performance status (ECOG): 1 - Symptomatic but  completely ambulatory  There were no vitals filed for this visit. Wt Readings from Last 3 Encounters:  04/02/20 153 lb (69.4 kg)  01/04/20 149 lb 6.4 oz (67.8 kg)  12/06/19 148 lb (67.1 kg)   Physical Exam Vitals reviewed.  Constitutional:      Appearance: Normal appearance.  Abdominal:     General:  There is no distension.  Skin:    Comments: Right lumpectomy and axillary lymph node biopsy site well-healed.  Neurological:     General: No focal deficit present.     Mental Status: She is alert and oriented to person, place, and time.  Psychiatric:        Mood and Affect: Mood normal.        Behavior: Behavior normal.      LABORATORY DATA:  I have reviewed the labs as listed.  CBC Latest Ref Rng & Units 04/02/2020 02/12/2020 11/22/2019  WBC 4.0 - 10.5 K/uL 5.4 5.1 5.5  Hemoglobin 12.0 - 15.0 g/dL 15.7(H) 15.0 13.0  Hematocrit 36.0 - 46.0 % 50.4(H) 47.4(H) 43.5  Platelets 150 - 400 K/uL 186 173 215   CMP Latest Ref Rng & Units 04/02/2020 02/12/2020 12/25/2019  Glucose 70 - 99 mg/dL 92 94 -  BUN 8 - 23 mg/dL 25(H) 30(H) -  Creatinine 0.44 - 1.00 mg/dL 1.31(H) 1.59(H) 1.30(H)  Sodium 135 - 145 mmol/L 143 139 -  Potassium 3.5 - 5.1 mmol/L 4.4 4.7 -  Chloride 98 - 111 mmol/L 105 106 -  CO2 22 - 32 mmol/L 29 26 -  Calcium 8.9 - 10.3 mg/dL 10.0 9.6 -  Total Protein 6.5 - 8.1 g/dL 6.7 6.3(L) -  Total Bilirubin 0.3 - 1.2 mg/dL 0.8 1.0 -  Alkaline Phos 38 - 126 U/L 95 75 -  AST 15 - 41 U/L 22 20 -  ALT 0 - 44 U/L 20 26 -   04/02/2020 Bilateral breast biopsy [id number] FINAL MICROSCOPIC DIAGNOSIS:  A. BREAST, LEFT TISSUE WITH SEED, LUMPECTOMPY:  - Isolated microscopic foci of atypical ductal hyperplasia  - Biopsy site changes  B. BREAST, RIGHT TISSUE WITH SEED, LUMPECTOMY:  - Invasive lobular carcinoma, grade 2, 1.2 cm. See comment  - Resection margins are negative for carcinoma; closest is the medial  margin at 0.6 cm  - Negative for lymphovascular invasion  - Biopsy site  changes  - See oncology table  C. SOFT TISSUE, AXILLARY RIGHT, SEED, EXCIXION:  - Metastatic carcinoma consistent with patient's clinical history of  primary colorectal carcinoma  D. LYMPH NODE, SENTINEL RIGHT AXILLARY, EXCISION:  - Lymph node, negative for carcinoma (0/1)  E. LYMPH NODE, SENTINEL RIGHT AXILLARY, EXCISION:  - Lymph node, negative for carcinoma (0/1)    DIAGNOSTIC IMAGING:  I have independently reviewed the scans and discussed with the patient. NM Sentinel Node Inj-No Rpt (Breast)  Result Date: 04/02/2020 Sulfur colloid was injected by the nuclear medicine technologist for melanoma sentinel node.   MM Breast Surgical Specimen  Result Date: 04/02/2020 CLINICAL DATA:  Post right and right axilla excision. EXAM: SPECIMEN RADIOGRAPH OF THE RIGHT BREAST AND RIGHT AXILLA COMPARISON:  Previous exam(s). FINDINGS: Status post excision of the right breast and right axilla. The radioactive seed and spiral shaped HydroMARK biopsy marker clip are present, completely intact, and were marked for pathology. The radioactive seed and cylindrical shaped biopsy marking clip are present, completely intact and were marked for pathology. IMPRESSION: Specimen radiographs of the right breast and right axilla. Electronically Signed   By: Everlean Alstrom M.D.   On: 04/02/2020 10:25   MM Breast Surgical Specimen  Result Date: 04/02/2020 CLINICAL DATA:  Post right and right axilla excision. EXAM: SPECIMEN RADIOGRAPH OF THE RIGHT BREAST AND RIGHT AXILLA COMPARISON:  Previous exam(s). FINDINGS: Status post excision of the right breast and right axilla. The radioactive seed and spiral shaped HydroMARK biopsy marker clip  are present, completely intact, and were marked for pathology. The radioactive seed and cylindrical shaped biopsy marking clip are present, completely intact and were marked for pathology. IMPRESSION: Specimen radiographs of the right breast and right axilla. Electronically Signed   By:  Everlean Alstrom M.D.   On: 04/02/2020 10:25   MM Breast Surgical Specimen  Result Date: 04/02/2020 CLINICAL DATA:  Post left breast excision. EXAM: SPECIMEN RADIOGRAPH OF THE LEFT BREAST COMPARISON:  Previous exam(s). FINDINGS: Status post excision of the left breast. The radioactive seed and DUMBBELL SHAPED biopsy marker clip are present, completely intact, and were marked for pathology. IMPRESSION: Specimen radiograph of the left breast. Electronically Signed   By: Everlean Alstrom M.D.   On: 04/02/2020 09:24   MM LT RADIOACTIVE SEED LOC MAMMO GUIDE  Result Date: 04/01/2020 CLINICAL DATA:  Biopsy proven metastatic carcinoma in a right axillary lymph node. Biopsy proven mammary carcinoma in situ with an area suspicious for invasion in the right breast and atypical ductal hyperplasia in the left breast. EXAM: MAMMOGRAPHIC AND ULTRASOUND GUIDED RADIOACTIVE SEED LOCALIZATION OF THE BOTH BREAST AND RIGHT AXILLA COMPARISON:  Previous exam(s). FINDINGS: Patient presents for radioactive seed localization prior to surgery. I met with the patient and we discussed the procedure of seed localization including benefits and alternatives. We discussed the high likelihood of a successful procedure. We discussed the risks of the procedure including infection, bleeding, tissue injury and further surgery. We discussed the low dose of radioactivity involved in the procedure. Informed, written consent was given. The usual time-out protocol was performed immediately prior to the procedure. Using ultrasound guidance, sterile technique, 1% lidocaine and an I-125 radioactive seed, THE HYDROMARK CLIP AND MASS IN THE RIGHT AXILLA was localized using a lateral to medial approach. The follow-up mammogram images confirm the seed in the expected location and were marked for Dr. Alena Bills. Follow-up survey of the patient confirms presence of the radioactive seed. Order number of I-125 seed:  [id number]. Total activity:  6.283 millicuries  reference Date: 03/07/2020 The patient tolerated the procedure well and was released from the [facility]. She was given instructions regarding seed removal. Patient presents for radioactive seed localization prior to surgery. I met with the patient and we discussed the procedure of seed localization including benefits and alternatives. We discussed the high likelihood of a successful procedure. We discussed the risks of the procedure including infection, bleeding, tissue injury and further surgery. We discussed the low dose of radioactivity involved in the procedure. Informed, written consent was given. The usual time-out protocol was performed immediately prior to the procedure. Using mammographic guidance, sterile technique, 1% lidocaine and an I-125 radioactive seed, CYLINDRICAL SHAPED CLIP IN THE RIGHT BREAST was localized using a medial to lateral approach. The follow-up mammogram images confirm the seed in the expected location and were marked for Dr. Alena Bills. Follow-up survey of the patient confirms presence of the radioactive seed. Order number of I-125 seed:  [id number]. Total activity:  3.710 millicuries reference Date: 03/07/2020 The patient tolerated the procedure well and was released from the [facility]. She was given instructions regarding seed removal. Patient presents for radioactive seed localization prior to surgery. I met with the patient and we discussed the procedure of seed localization including benefits and alternatives. We discussed the high likelihood of a successful procedure. We discussed the risks of the procedure including infection, bleeding, tissue injury and further surgery. We discussed the low dose of radioactivity involved in the procedure. Informed, written consent was given.  The usual time-out protocol was performed immediately prior to the procedure. Using mammographic guidance, sterile technique, 1% lidocaine and an I-125 radioactive seed, DUMBBELL-SHAPED CLIP IN THE  LEFT BREAST was localized using a lateral to medial approach. The follow-up mammogram images confirm the seed in the expected location and were marked for Dr. Alena Bills. Follow-up survey of the patient confirms presence of the radioactive seed. Order number of I-125 seed:  [id number]. Total activity:  1.937 millicuries reference Date: 03/07/2020 The patient tolerated the procedure well and was released from the [facility]. She was given instructions regarding seed removal. IMPRESSION: Radioactive seed localizations of the right axilla, right breast and left breast. Electronically Signed   By: Lillia Mountain M.D.   On: 04/01/2020 16:51   MM RT RADIOACTIVE SEED LOC MAMMO GUIDE  Result Date: 04/01/2020 CLINICAL DATA:  Biopsy proven metastatic carcinoma in a right axillary lymph node. Biopsy proven mammary carcinoma in situ with an area suspicious for invasion in the right breast and atypical ductal hyperplasia in the left breast. EXAM: MAMMOGRAPHIC AND ULTRASOUND GUIDED RADIOACTIVE SEED LOCALIZATION OF THE BOTH BREAST AND RIGHT AXILLA COMPARISON:  Previous exam(s). FINDINGS: Patient presents for radioactive seed localization prior to surgery. I met with the patient and we discussed the procedure of seed localization including benefits and alternatives. We discussed the high likelihood of a successful procedure. We discussed the risks of the procedure including infection, bleeding, tissue injury and further surgery. We discussed the low dose of radioactivity involved in the procedure. Informed, written consent was given. The usual time-out protocol was performed immediately prior to the procedure. Using ultrasound guidance, sterile technique, 1% lidocaine and an I-125 radioactive seed, THE HYDROMARK CLIP AND MASS IN THE RIGHT AXILLA was localized using a lateral to medial approach. The follow-up mammogram images confirm the seed in the expected location and were marked for Dr. Alena Bills. Follow-up survey of the patient  confirms presence of the radioactive seed. Order number of I-125 seed:  [id number]. Total activity:  3.299 millicuries reference Date: 03/07/2020 The patient tolerated the procedure well and was released from the [facility]. She was given instructions regarding seed removal. Patient presents for radioactive seed localization prior to surgery. I met with the patient and we discussed the procedure of seed localization including benefits and alternatives. We discussed the high likelihood of a successful procedure. We discussed the risks of the procedure including infection, bleeding, tissue injury and further surgery. We discussed the low dose of radioactivity involved in the procedure. Informed, written consent was given. The usual time-out protocol was performed immediately prior to the procedure. Using mammographic guidance, sterile technique, 1% lidocaine and an I-125 radioactive seed, CYLINDRICAL SHAPED CLIP IN THE RIGHT BREAST was localized using a medial to lateral approach. The follow-up mammogram images confirm the seed in the expected location and were marked for Dr. Alena Bills. Follow-up survey of the patient confirms presence of the radioactive seed. Order number of I-125 seed:  [id number]. Total activity:  6.222 millicuries reference Date: 03/07/2020 The patient tolerated the procedure well and was released from the [facility]. She was given instructions regarding seed removal. Patient presents for radioactive seed localization prior to surgery. I met with the patient and we discussed the procedure of seed localization including benefits and alternatives. We discussed the high likelihood of a successful procedure. We discussed the risks of the procedure including infection, bleeding, tissue injury and further surgery. We discussed the low dose of radioactivity involved in the procedure. Informed, written consent was  given. The usual time-out protocol was performed immediately prior to the procedure.  Using mammographic guidance, sterile technique, 1% lidocaine and an I-125 radioactive seed, DUMBBELL-SHAPED CLIP IN THE LEFT BREAST was localized using a lateral to medial approach. The follow-up mammogram images confirm the seed in the expected location and were marked for Dr. Alena Bills. Follow-up survey of the patient confirms presence of the radioactive seed. Order number of I-125 seed:  [id number]. Total activity:  6.803 millicuries reference Date: 03/07/2020 The patient tolerated the procedure well and was released from the [facility]. She was given instructions regarding seed removal. IMPRESSION: Radioactive seed localizations of the right axilla, right breast and left breast. Electronically Signed   By: Lillia Mountain M.D.   On: 04/01/2020 16:51   [place] RT RADIOACTIVE SEED LOC  Result Date: 04/01/2020 CLINICAL DATA:  Biopsy proven metastatic carcinoma in a right axillary lymph node. Biopsy proven mammary carcinoma in situ with an area suspicious for invasion in the right breast and atypical ductal hyperplasia in the left breast. EXAM: MAMMOGRAPHIC AND ULTRASOUND GUIDED RADIOACTIVE SEED LOCALIZATION OF THE BOTH BREAST AND RIGHT AXILLA COMPARISON:  Previous exam(s). FINDINGS: Patient presents for radioactive seed localization prior to surgery. I met with the patient and we discussed the procedure of seed localization including benefits and alternatives. We discussed the high likelihood of a successful procedure. We discussed the risks of the procedure including infection, bleeding, tissue injury and further surgery. We discussed the low dose of radioactivity involved in the procedure. Informed, written consent was given. The usual time-out protocol was performed immediately prior to the procedure. Using ultrasound guidance, sterile technique, 1% lidocaine and an I-125 radioactive seed, THE HYDROMARK CLIP AND MASS IN THE RIGHT AXILLA was localized using a lateral to medial approach. The follow-up mammogram images  confirm the seed in the expected location and were marked for Dr. Alena Bills. Follow-up survey of the patient confirms presence of the radioactive seed. Order number of I-125 seed:  [id number]. Total activity:  0.370 millicuries reference Date: 03/07/2020 The patient tolerated the procedure well and was released from the [facility]. She was given instructions regarding seed removal. Patient presents for radioactive seed localization prior to surgery. I met with the patient and we discussed the procedure of seed localization including benefits and alternatives. We discussed the high likelihood of a successful procedure. We discussed the risks of the procedure including infection, bleeding, tissue injury and further surgery. We discussed the low dose of radioactivity involved in the procedure. Informed, written consent was given. The usual time-out protocol was performed immediately prior to the procedure. Using mammographic guidance, sterile technique, 1% lidocaine and an I-125 radioactive seed, CYLINDRICAL SHAPED CLIP IN THE RIGHT BREAST was localized using a medial to lateral approach. The follow-up mammogram images confirm the seed in the expected location and were marked for Dr. Alena Bills. Follow-up survey of the patient confirms presence of the radioactive seed. Order number of I-125 seed:  [id number]. Total activity:  5.038 millicuries reference Date: 03/07/2020 The patient tolerated the procedure well and was released from the [place]. She was given instructions regarding seed removal. Patient presents for radioactive seed localization prior to surgery. I met with the patient and we discussed the procedure of seed localization including benefits and alternatives. We discussed the high likelihood of a successful procedure. We discussed the risks of the procedure including infection, bleeding, tissue injury and further surgery. We discussed the low dose of radioactivity involved in the procedure. Informed,  written consent was  given. The usual time-out protocol was performed immediately prior to the procedure. Using mammographic guidance, sterile technique, 1% lidocaine and an I-125 radioactive seed, DUMBBELL-SHAPED CLIP IN THE LEFT BREAST was localized using a lateral to medial approach. The follow-up mammogram images confirm the seed in the expected location and were marked for Dr. Alena Bills. Follow-up survey of the patient confirms presence of the radioactive seed. Order number of I-125 seed:  [id number]. Total activity:  1.165 millicuries reference Date: 03/07/2020 The patient tolerated the procedure well and was released from the [place]. She was given instructions regarding seed removal. IMPRESSION: Radioactive seed localizations of the right axilla, right breast and left breast. Electronically Signed   By: Lillia Mountain M.D.   On: 04/01/2020 16:51     ASSESSMENT:  1.  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  Right hemicolectomy on 10/11/2019, grade 3 adenocarcinoma, negative margins, positive LVSI, 2 tumor deposits, 0/15 lymph nodes positive, PT3PN1C. 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  MLH1 hyper methylation present. -PET scan in December 2020 showed lymph node in the right axillary region.  Biopsy consistent with metastatic colon cancer. -Last CEA was 10.7 on 11/22/2019. -Right axillary lymph node excision on 04/02/2020 consistent with metastatic carcinoma from colon cancer.  2.  Stage I (PT1CPN0) right Breast, Grade 2 Invasive Lobular Carcinoma: -MRI of the breast on 12/25/2019 showed 1 cm mass behind the right areola with a suspicious mass in the left breast. -Right breast lumpectomy on 04/02/2020 shows invasive lobular carcinoma, grade 2, 1.2 cm.  Resection margins are negative.  Negative LVSI.  2 sentinel lymph nodes were negative for carcinoma.  ER/PR 100% positive, HER-2 negative, Ki-67  10%.   PLAN:  1.  Stage IV colon cancer to the right axillary lymph node: -We talked about pathology report in detail. -I have recommended a PET CT scan.  I have also recommended CEA level. -As she has metastatic disease to the right axillary lymph node, I have recommended limited duration chemotherapy there is no further evidence of metastatic disease. -Because of her age and MSI high status, she could also be a candidate for immunotherapy with nivolumab or pembrolizumab. -We will discuss further once she comes back after the scans.  2. Right Breast, Grade 2 Invasive Lobular Carcinoma: -I have discussed the pathology report in detail.  I have recommended antiestrogen therapy with anastrozole. -We will obtain a baseline bone density test. -We have also discussed the role for radiation.  She is not too keen to pursue it.  3. Normocytic anemia: -Feraheme on 10/27/2019 and 10/24/2019. -Last CBC shows hemoglobin was normal.  Ferritin improved to 30.    Orders placed this encounter:  No orders of the defined types were placed in this encounter.    Derek Jack, MD [place] [phone] Total time spent is 40 minutes with more than 50% of the time spent face-to-face discussing pathology results, management and further recommendations, counseling and coordination of care.  I, [employer], am acting as a Education administrator for Dr. Sanda Linger.  I, Derek Jack MD, have reviewed the above documentation for accuracy and completeness, and I agree with the above.

## 2020-04-23 NOTE — Patient Instructions (Signed)
[place] at [facility] Discharge Instructions  You were seen today by Dr. Delton Coombes. He went over your recent results. A PET scan will be performed and labs will be drawn to determine the treatment course. Dr. Delton Coombes will see you back after the PET scan.   Thank you for choosing [place] at [facility] to provide your oncology and hematology care.  To afford each patient quality time with our provider, please arrive at least 15 minutes before your scheduled appointment time.   If you have a lab appointment with the [place] please come in thru the  Main Entrance and check in at the main information desk  You need to re-schedule your appointment should you arrive 10 or more minutes late.  We strive to give you quality time with our providers, and arriving late affects you and other patients whose appointments are after yours.  Also, if you no show three or more times for appointments you may be dismissed from the clinic at the providers discretion.     Again, thank you for choosing [facility].  Our hope is that these requests will decrease the amount of time that you wait before being seen by our physicians.       _____________________________________________________________  Should you have questions after your visit to [facility], please contact our office at [phone] between the hours of 8:00 a.m. and 4:30 p.m.  Voicemails left after 4:00 p.m. will not be returned until the following business day.  For prescription refill requests, have your pharmacy contact our office and allow 72 hours.    Cancer Center Support Programs:   > Cancer Support Group  2nd Tuesday of the month 1pm-2pm, Journey Room

## 2020-04-24 ENCOUNTER — Other Ambulatory Visit: Payer: Self-pay

## 2020-04-24 ENCOUNTER — Other Ambulatory Visit (HOSPITAL_COMMUNITY): Payer: Medicare Other

## 2020-04-24 ENCOUNTER — Inpatient Hospital Stay (HOSPITAL_COMMUNITY): Payer: Medicare Other

## 2020-04-24 DIAGNOSIS — C182 Malignant neoplasm of ascending colon: Secondary | ICD-10-CM | POA: Diagnosis not present

## 2020-04-24 LAB — COMPREHENSIVE METABOLIC PANEL
ALT: 16 U/L (ref 0–44)
AST: 20 U/L (ref 15–41)
Albumin: 4 g/dL (ref 3.5–5.0)
Alkaline Phosphatase: 83 U/L (ref 38–126)
Anion gap: 7 (ref 5–15)
BUN: 21 mg/dL (ref 8–23)
CO2: 27 mmol/L (ref 22–32)
Calcium: 9.5 mg/dL (ref 8.9–10.3)
Chloride: 104 mmol/L (ref 98–111)
Creatinine, Ser: 1.3 mg/dL — ABNORMAL HIGH (ref 0.44–1.00)
GFR calc Af Amer: 45 mL/min — ABNORMAL LOW (ref >60)
GFR calc non Af Amer: 38 mL/min — ABNORMAL LOW (ref >60)
Glucose, Bld: 119 mg/dL — ABNORMAL HIGH (ref 70–99)
Potassium: 4.2 mmol/L (ref 3.5–5.1)
Sodium: 138 mmol/L (ref 135–145)
Total Bilirubin: 0.9 mg/dL (ref 0.3–1.2)
Total Protein: 6.7 g/dL (ref 6.5–8.1)

## 2020-04-24 LAB — CBC WITH DIFFERENTIAL/PLATELET
Abs Immature Granulocytes: 0.01 10*3/uL (ref 0.00–0.07)
Basophils Absolute: 0.1 10*3/uL (ref 0.0–0.1)
Basophils Relative: 1 %
Eosinophils Absolute: 0.1 10*3/uL (ref 0.0–0.5)
Eosinophils Relative: 2 %
HCT: 46.3 % — ABNORMAL HIGH (ref 36.0–46.0)
Hemoglobin: 14.5 g/dL (ref 12.0–15.0)
Immature Granulocytes: 0 %
Lymphocytes Relative: 21 %
Lymphs Abs: 1.1 10*3/uL (ref 0.7–4.0)
MCH: 29.2 pg (ref 26.0–34.0)
MCHC: 31.3 g/dL (ref 30.0–36.0)
MCV: 93.2 fL (ref 80.0–100.0)
Monocytes Absolute: 0.4 10*3/uL (ref 0.1–1.0)
Monocytes Relative: 7 %
Neutro Abs: 3.6 10*3/uL (ref 1.7–7.7)
Neutrophils Relative %: 69 %
Platelets: 174 10*3/uL (ref 150–400)
RBC: 4.97 MIL/uL (ref 3.87–5.11)
RDW: 13.6 % (ref 11.5–15.5)
WBC: 5.2 10*3/uL (ref 4.0–10.5)
nRBC: 0 % (ref 0.0–0.2)

## 2020-04-25 LAB — CEA: CEA: 13.4 ng/mL — ABNORMAL HIGH (ref 0.0–4.7)

## 2020-05-02 ENCOUNTER — Other Ambulatory Visit: Payer: Self-pay

## 2020-05-02 ENCOUNTER — Encounter (HOSPITAL_COMMUNITY)
Admission: RE | Admit: 2020-05-02 | Discharge: 2020-05-02 | Disposition: A | Discharge status: Home / Self Care | Payer: Medicare Other | Source: Ambulatory Visit | Attending: Hematology | Admitting: Hematology

## 2020-05-02 DIAGNOSIS — C182 Malignant neoplasm of ascending colon: Secondary | ICD-10-CM | POA: Diagnosis not present

## 2020-05-02 DIAGNOSIS — Z79899 Other long term (current) drug therapy: Secondary | ICD-10-CM | POA: Diagnosis not present

## 2020-05-02 LAB — GLUCOSE, CAPILLARY: Glucose-Capillary: 101 mg/dL — ABNORMAL HIGH (ref 70–99)

## 2020-05-02 MED ORDER — FLUDEOXYGLUCOSE F - 18 (FDG) INJECTION
7.3000 | Freq: Once | INTRAVENOUS | Status: AC | PRN
  Administered 2020-05-02: 7.3 via INTRAVENOUS

## 2020-05-06 ENCOUNTER — Encounter (HOSPITAL_COMMUNITY): Payer: Self-pay | Admitting: Hematology

## 2020-05-06 ENCOUNTER — Inpatient Hospital Stay (HOSPITAL_COMMUNITY): Payer: Medicare Other | Admitting: Hematology

## 2020-05-06 ENCOUNTER — Other Ambulatory Visit: Payer: Self-pay

## 2020-05-06 VITALS — BP 186/75 | HR 52 | Temp 96.8°F | Resp 19 | Wt 157.0 lb

## 2020-05-06 DIAGNOSIS — Z7189 Other specified counseling: Secondary | ICD-10-CM | POA: Diagnosis not present

## 2020-05-06 DIAGNOSIS — Z01810 Encounter for preprocedural cardiovascular examination: Secondary | ICD-10-CM | POA: Insufficient documentation

## 2020-05-06 DIAGNOSIS — C50911 Malignant neoplasm of unspecified site of right female breast: Secondary | ICD-10-CM

## 2020-05-06 DIAGNOSIS — C182 Malignant neoplasm of ascending colon: Secondary | ICD-10-CM

## 2020-05-06 MED ORDER — ANASTROZOLE 1 MG PO TABS: 1.0000 mg | ORAL_TABLET | Freq: Every day | ORAL | 6 refills | Status: DC

## 2020-05-06 NOTE — Patient Instructions (Addendum)
[place] at [facility] Discharge Instructions  You were seen today by Dr. Delton Coombes. He went over your recent results and PET scan. The side effects were discussed today, such as dry skin and cracking, diarrhea, shortness of breath or cough; be vigilant for these symptoms and let us know if you develop any of them.  Please start taking the anastrozole daily. You will be referred to a general surgeon for port placement. You will begin treatment next week. Dr. Delton Coombes will see you back 3 weeks after your first treatment for labs and follow up.   Thank you for choosing [place] at [facility] to provide your oncology and hematology care.  To afford each patient quality time with our provider, please arrive at least 15 minutes before your scheduled appointment time.   If you have a lab appointment with the [place] please come in thru the Main Entrance and check in at the main information desk  You need to re-schedule your appointment should you arrive 10 or more minutes late.  We strive to give you quality time with our providers, and arriving late affects you and other patients whose appointments are after yours.  Also, if you no show three or more times for appointments you may be dismissed from the clinic at the providers discretion.     Again, thank you for choosing [facility].  Our hope is that these requests will decrease the amount of time that you wait before being seen by our physicians.       _____________________________________________________________  Should you have questions after your visit to [facility], please contact our office at [phone] between the hours of 8:00 a.m. and 4:30 p.m.  Voicemails left after 4:00 p.m. will not be returned until the following business day.  For prescription refill requests, have your pharmacy contact our office and allow 72 hours.    Cancer Center Support  Programs:   > Cancer Support Group  2nd Tuesday of the month 1pm-2pm, Journey Room

## 2020-05-06 NOTE — Progress Notes (Signed)
START OFF PATHWAY REGIMEN - Colorectal   [id number]:Pembrolizumab 200 mg q21 Days:   A cycle is 21 days:     Pembrolizumab   **Always confirm dose/schedule in your pharmacy ordering system**  Patient Characteristics: Distant Metastases, Nonsurgical Candidate, BRAF V600 Positive and KRAS/NRAS Mutation Positive/Unknown Tumor Location: Colon Therapeutic Status: Distant Metastases Microsatellite/Mismatch Repair Status: MSI-H/dMMR BRAF Mutation Status: Mutation Positive KRAS/NRAS Mutation Status: Awaiting Test Results Intent of Therapy: Non-Curative / Palliative Intent, Discussed with Patient

## 2020-05-06 NOTE — Progress Notes (Signed)
Jessica Taylor, Jessica Taylor [id number]   CLINIC:  Medical Oncology/Hematology  PCP:  Antony Contras, MD [address] / [place] [address] [phone]   REASON FOR VISIT:  Follow-up for stage I breast cancer and colon cancer  PRIOR THERAPY:  1. Robotic proximal colectomy 10/11/2019  2.  Right breast lumpectomy and SLNB on 04/02/2020.  NGS Results: MSI-high, foundation 1 not sent.  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 [facility])   04/23/2020 Cancer Staging   Staging form: Breast, AJCC 8th Edition - Clinical stage from 04/23/2020: Stage IA (cT1c, cN0(sn), cM0, G2, ER+, PR+, HER2-) - Signed by Derek Jack, MD on 04/23/2020     CANCER STAGING: Cancer Staging Cancer of ascending colon s/p robotic proximal colectomy 10/11/2019 Staging form: Colon and Rectum, AJCC 8th Edition - Clinical stage from 10/23/2019: Stage IVA (cT3, cN1c, cM1a) - Signed by Derek Jack, MD on 12/14/2019  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 female, returns for routine follow-up of her stage I breast cancer and colon cancer. [name] was last seen on 04/23/2020.  Today  she reports feeling well. She reports having hematuria intermittently for the past 6 months, but denies dysuria. She had kidney stones and the hematuria started afterwards. She will see the urologist, Dr. Rosana Hoes, today. Her fatigue is intermittent. She reports having 1 BM daily, sometimes soft stool and other times diarrhea.   REVIEW OF SYSTEMS:  Review of Systems  Constitutional: Positive for fatigue (moderate). Negative for appetite change.  Gastrointestinal: Positive for diarrhea.  Genitourinary: Positive for hematuria (intermittent). Negative for dysuria.   All other systems reviewed and are negative.   PAST MEDICAL/SURGICAL HISTORY:  Past Medical History:  Diagnosis Date  . Abdominal aortic aneurysm ([address])   . Acid reflux   . Anemia   . Arthritis    knees , R shoulder - tx /w injection - 11/2014  . Basal cell carcinoma (BCC) of dorsum of nose 2010   Resolved  . Cancer ([place])    basal cell on nose  . Colon cancer ([place])   . Coronary artery disease   . Hypercholesteremia   . Hypertension   . Hypothyroidism   . Lt Acute pyelonephritis 09/11/2018  . 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 Peripheral arterial disease ([facility])    hhigh-grade ostial bilateral calcified iliac stenosis with claudication  . Tobacco abuse   . Vertigo    when lays on left side.   Past Surgical History:  Procedure Laterality Date  . BREAST LUMPECTOMY WITH RADIOACTIVE SEED AND SENTINEL LYMPH NODE BIOPSY Bilateral 04/02/2020   Procedure: BILATERAL BREAST LUMPECTOMY WITH RADIOACTIVE SEED AND RIGHT SENTINEL LYMPH NODE BIOPSY AND RIGHT TARGETED AXILLARY LYMPH NODE BIOPSY;  Surgeon:  Erroll Luna, MD;  Location: [place];  Service: General;  Laterality: Bilateral;  . CARDIAC CATHETERIZATION  5 stents  . CARDIAC CATHETERIZATION N/A 01/20/2016   Procedure: Left Heart Cath and Coronary Angiography;  Surgeon: Peter M Martinique, MD;  Location: [place] [facility];  Service: Cardiovascular;  Laterality: N/A;  . CATARACT  EXTRACTION W/PHACO Left 05/24/2014   Procedure: CATARACT EXTRACTION PHACO AND INTRAOCULAR LENS PLACEMENT (IOC);  Surgeon: Tonny Branch, MD;  Location: [facility];  Service: Ophthalmology;  Laterality: Left;  CDE:  9.30  . CATARACT EXTRACTION W/PHACO Right 06/18/2014   Procedure: CATARACT EXTRACTION PHACO AND INTRAOCULAR LENS PLACEMENT RIGHT EYE CDE=10.84;  Surgeon: Tonny Branch, MD;  Location: [facility];  Service: Ophthalmology;  Laterality: Right;  . CORONARY ARTERY BYPASS GRAFT N/A 01/24/2016   Procedure: CORONARY ARTERY BYPASS GRAFTING (CABG);  Surgeon: Gaye Pollack, MD;  Location: [place];  Service: Open Heart Surgery;  Laterality: N/A;  . CORONARY STENT PLACEMENT    . CORONARY STENT PLACEMENT  03/06/15   CFX DES  . CYSTOSCOPY/URETEROSCOPY/HOLMIUM LASER/STENT PLACEMENT Left 09/12/2018   Procedure: CYSTOSCOPY/URETEROSCOPY/STENT PLACEMENT;  Surgeon: Ceasar Mons, MD;  Location: [facility];  Service: Urology;  Laterality: Left;  . EYE SURGERY    . LEFT HEART CATH AND CORS/GRAFTS ANGIOGRAPHY N/A 09/12/2019   Procedure: LEFT HEART CATH AND CORS/GRAFTS ANGIOGRAPHY;  Surgeon: [place] Sine, MD;  Location: [place] [facility];  Service: Cardiovascular;  Laterality: N/A;  . LEFT HEART CATHETERIZATION WITH CORONARY ANGIOGRAM N/A 03/06/2015   Procedure: LEFT HEART CATHETERIZATION WITH CORONARY ANGIOGRAM;  Surgeon: Lorretta Harp, MD;  Location: [facility] CATH LAB;  Service: Cardiovascular;  Laterality: N/A;  . PARTIAL KNEE ARTHROPLASTY Right 02/04/2015   Procedure: UNICOMPARTMENTAL KNEE;  Surgeon: Dorna Leitz, MD;  Location: [place];  Service: Orthopedics;  Laterality: Right;  . PERIPHERAL VASCULAR CATHETERIZATION Bilateral 05/13/2015   Procedure: Lower Extremity Angiography;  Surgeon: Lorretta Harp, MD;  Location: [place] [facility];  Service: Cardiovascular;  Laterality: Bilateral;  . PERIPHERAL VASCULAR CATHETERIZATION N/A 05/13/2015   Procedure: Abdominal Aortogram;  Surgeon: Lorretta Harp, MD;  Location: [place] [facility];  Service: Cardiovascular;  Laterality: N/A;  . PERIPHERAL VASCULAR CATHETERIZATION Bilateral 06/27/2015   Procedure: Peripheral Vascular Intervention;  Surgeon: Lorretta Harp, MD;  Location: [place] [facility];  Service: Cardiovascular;  Laterality: Bilateral;  ILIACS  . PERIPHERAL VASCULAR CATHETERIZATION Bilateral 06/27/2015   Procedure: Peripheral Vascular Atherectomy;  Surgeon: Lorretta Harp, MD;  Location: [place] [facility];  Service: Cardiovascular;  Laterality: Bilateral;  . TEE WITHOUT CARDIOVERSION N/A 01/24/2016   Procedure: TRANSESOPHAGEAL ECHOCARDIOGRAM (TEE);  Surgeon: Gaye Pollack, MD;  Location: [place];  Service: Open Heart Surgery;  Laterality: N/A;  . TONSILLECTOMY     age 36  . TUBAL LIGATION      SOCIAL HISTORY:  Social History   Socioeconomic History  . Marital status: Married    Spouse name: [name]  . Number of children: 4  . Years of education: Not on file  . Highest education level: Not on file  Occupational History  . Occupation: retired    Comment: worked as Presenter, broadcasting for [employer]  Tobacco Use  . Smoking status: Former Smoker    Packs/day: 1.50    Years: 42.00    Pack years: 63.00    Types: Cigarettes    Quit date: 05/19/1999    Years since quitting: 20.9  . Smokeless tobacco: Never Used  Vaping  Use  . Vaping Use: Never used  Substance and Sexual Activity  . Alcohol use: No  . Drug use: No  . Sexual activity: Yes    Birth control/protection: Surgical  Other Topics Concern  . Not on file  Social History Narrative  . Not on file   Social Determinants of Health   Financial Resource Strain:   . Difficulty of Paying Living Expenses:   Food Insecurity:   . Worried About Charity fundraiser in the Last Year:   . Arboriculturist in the Last Year:   Transportation Needs:   . Film/video editor (Medical):   Marland Kitchen Lack of Transportation (Non-Medical):   Physical Activity:   . Days of Exercise per Week:   . Minutes  of Exercise per Session:   Stress:   . Feeling of Stress :   Social Connections:   . Frequency of Communication with Friends and Family:   . Frequency of Social Gatherings with Friends and Family:   . Attends Religious Services:   . Active Member of Clubs or Organizations:   . Attends Archivist Meetings:   Marland Kitchen Marital Status:   Intimate Partner Violence:   . Fear of Current or Ex-Partner:   . Emotionally Abused:   Marland Kitchen Physically Abused:   . Sexually Abused:     FAMILY HISTORY:  Family History  Problem Relation Age of Onset  . Ovarian cancer Mother 31  . Cancer Father 5       unsure of which kind, "it was in his glands"  . Hypertension Maternal Grandmother   . Stroke Maternal Grandfather   . Hypertension Son   . Brain cancer Sister   . Hypertension Daughter   . Heart attack Neg Hx   . Colon cancer Neg Hx   . Esophageal cancer Neg Hx   . Inflammatory bowel disease Neg Hx   . Liver disease Neg Hx   . Pancreatic cancer Neg Hx   . Rectal cancer Neg Hx   . Stomach cancer Neg Hx     CURRENT MEDICATIONS:  Current Outpatient Medications  Medication Sig Dispense Refill  . aspirin EC 81 MG tablet Take 81 mg by mouth every evening.     Marland Kitchen atorvastatin (LIPITOR) 40 MG tablet TAKE (1) TABLET BY MOUTH ONCE DAILY. 90 tablet 3  . clopidogrel (PLAVIX) 75 MG tablet TAKE (1) TABLET BY MOUTH ONCE DAILY. (Patient taking differently: Take 75 mg by mouth daily. ) 90 tablet 3  . esomeprazole (NEXIUM) 20 MG capsule Take 20 mg by mouth daily.     Marland Kitchen levothyroxine (SYNTHROID, LEVOTHROID) 50 MCG tablet Take 50 mcg by mouth daily before breakfast.    . metoprolol tartrate (LOPRESSOR) 25 MG tablet Take 1 tablet (25 mg total) by mouth 2 (two) times daily. 180 tablet 3  . gabapentin (NEURONTIN) 100 MG capsule Take 200 mg by mouth every 8 (eight) hours as needed. (Patient not taking: Reported on 05/06/2020)    . HYDROcodone-acetaminophen (NORCO/VICODIN) 5-325 MG tablet Take 1 tablet by mouth every  6 (six) hours as needed for moderate pain. (Patient not taking: Reported on 05/06/2020) 15 tablet 0  . meclizine (ANTIVERT) 12.5 MG tablet Take 12.5 mg by mouth every 8 (eight) hours as needed. (Patient not taking: Reported on 05/06/2020)    . nitroGLYCERIN (NITROSTAT) 0.4 MG SL tablet Place 1 tablet (0.4 mg total) under the tongue every 5 (five) minutes as needed for chest pain. (Patient not taking: Reported on  05/06/2020) 25 tablet prn  . traMADol (ULTRAM) 50 MG tablet Take 50 mg by mouth every 8 (eight) hours as needed. (Patient not taking: Reported on 05/06/2020)     No current facility-administered medications for this visit.    ALLERGIES:  Allergies  Allergen Reactions  . Shrimp [Shellfish Allergy] Nausea And Vomiting    Throws up violently    PHYSICAL EXAM:  Performance status (ECOG): 1 - Symptomatic but completely ambulatory  Vitals:   05/06/20 0944  BP: (!) 186/75  Pulse: (!) 52  Resp: 19  Temp: (!) 96.8 F (36 C)  SpO2: 99%   Wt Readings from Last 3 Encounters:  05/06/20 157 lb (71.2 kg)  04/23/20 156 lb 3.2 oz (70.9 kg)  04/02/20 153 lb (69.4 kg)   Physical Exam Vitals reviewed.  Constitutional:      Appearance: Normal appearance.  Cardiovascular:     Rate and Rhythm: Normal rate and regular rhythm.     Pulses: Normal pulses.     Heart sounds: Normal heart sounds.  Pulmonary:     Effort: Pulmonary effort is normal.     Breath sounds: Normal breath sounds.  Musculoskeletal:     Right lower leg: No edema.     Left lower leg: No edema.  Lymphadenopathy:     Upper Body:     Right upper body: No axillary adenopathy.     Left upper body: No axillary adenopathy.  Neurological:     General: No focal deficit present.     Mental Status: She is alert and oriented to person, place, and time.  Psychiatric:        Mood and Affect: Mood normal.        Behavior: Behavior normal.      LABORATORY DATA:  I have reviewed the labs as listed.  CBC Latest Ref Rng & Units  04/24/2020 04/02/2020 02/12/2020  WBC 4.0 - 10.5 K/uL 5.2 5.4 5.1  Hemoglobin 12.0 - 15.0 g/dL 14.5 15.7(H) 15.0  Hematocrit 36 - 46 % 46.3(H) 50.4(H) 47.4(H)  Platelets 150 - 400 K/uL 174 186 173   CMP Latest Ref Rng & Units 04/24/2020 04/02/2020 02/12/2020  Glucose 70 - 99 mg/dL 119(H) 92 94  BUN 8 - 23 mg/dL 21 25(H) 30(H)  Creatinine 0.44 - 1.00 mg/dL 1.30(H) 1.31(H) 1.59(H)  Sodium 135 - 145 mmol/L 138 143 139  Potassium 3.5 - 5.1 mmol/L 4.2 4.4 4.7  Chloride 98 - 111 mmol/L 104 105 106  CO2 22 - 32 mmol/L 27 29 26   Calcium 8.9 - 10.3 mg/dL 9.5 10.0 9.6  Total Protein 6.5 - 8.1 g/dL 6.7 6.7 6.3(L)  Total Bilirubin 0.3 - 1.2 mg/dL 0.9 0.8 1.0  Alkaline Phos 38 - 126 U/L 83 95 75  AST 15 - 41 U/L 20 22 20   ALT 0 - 44 U/L 16 20 26     DIAGNOSTIC IMAGING:  I have independently reviewed the scans and discussed with the patient. NM PET Image Initial (PI) Skull Base To Thigh  Result Date: 05/03/2020 CLINICAL DATA:  Subsequent treatment strategy for metastatic colon cancer. Recent right breast cancer diagnosis with atypical ductal hyperplasia in the left breast. EXAM: NUCLEAR MEDICINE PET SKULL BASE TO THIGH TECHNIQUE: 7.0 mCi F-18 FDG was injected intravenously. Full-ring PET imaging was performed from the skull base to thigh after the radiotracer. CT data was obtained and used for attenuation correction and anatomic localization. Fasting blood glucose: 101 mg/dl COMPARISON:  PET-CT 11/13/2019 FINDINGS: Mediastinal blood pool activity: SUV  max 2.7 NECK: No hypermetabolic cervical lymph nodes are identified.There are no lesions of the pharyngeal mucosal space. Incidental CT findings: Bilateral carotid atherosclerosis. CHEST: There are 2 increasing foci of hypermetabolic nodal activity in the right axilla. The more superior focus corresponds with an 8 mm nodule on image 47/5 and has an SUV max of 10.8. The more inferior focus appears to be associated with the pectoralis muscle and has an SUV max of 5.6.  Previously, there was a right axillary node with an SUV max of 5.6. There are no hypermetabolic mediastinal, hilar or internal mammary lymph nodes. Postsurgical changes are present in both breasts with fluid collections demonstrating low level metabolic activity which is nonsuspicious. On the right, this has an SUV max of 4.7, and on the left, an SUV max of 4.7. No suspicious pulmonary activity or nodularity. Incidental CT findings: Diffuse atherosclerosis of the aorta, great vessels and coronary arteries status post median sternotomy and CABG. Mild centrilobular emphysema. ABDOMEN/PELVIS: There is no hypermetabolic activity within the liver, adrenal glands, spleen or pancreas. There is no hypermetabolic nodal activity. Incidental CT findings: Stable postsurgical changes from previous right hemicolectomy. There is a stable cystic lesion in the dome of the left hepatic lobe, sigmoid diverticulosis and diffuse aortic and branch vessel atherosclerosis. 3.1 cm abdominal aortic aneurysm has not significantly changed. SKELETON: There is no hypermetabolic activity to suggest osseous metastatic disease. Incidental CT findings: none IMPRESSION: 1. No evidence of colon cancer recurrence or metastatic disease in the abdomen or pelvis. 2. Interval postsurgical changes in both breasts with increased hypermetabolic nodal activity in the right axilla. Per biopsy report of 04/02/2020, this corresponded with metastatic colorectal carcinoma and has progressed. The breast activity associated with the postsurgical fluid collections is not suspicious. 3. No other evidence of metastatic disease. Electronically Signed   By: Richardean Sale M.D.   On: 05/03/2020 13:58     ASSESSMENT:  1.  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  Right hemicolectomy on 10/11/2019, grade 3 adenocarcinoma, negative margins, positive LVSI, 2 tumor deposits, 0/15  lymph nodes positive, PT3PN1C. 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  MLH1 hyper methylation present. -PET scan in December 2020 showed lymph node in the right axillary region.  Biopsy consistent with metastatic colon cancer. -Last CEA was 10.7 on 11/22/2019. -Right axillary lymph node excision on 04/02/2020 consistent with metastatic carcinoma from colon cancer.  2.  Stage I (PT1CPN0) right Breast, Grade 2 Invasive Lobular Carcinoma: -MRI of the breast on 12/25/2019 showed 1 cm mass behind the right areola with a suspicious mass in the left breast. -Right breast lumpectomy on 04/02/2020 shows invasive lobular carcinoma, grade 2, 1.2 cm.  Resection margins are negative.  Negative LVSI.  2 sentinel lymph nodes were negative for carcinoma.  ER/PR 100% positive, HER-2 negative, Ki-67 10%.   PLAN:  1.  Stage IV colon cancer to the right axillary lymph node: -We reviewed PET scan dated 05/02/2020 which showed increased hypermetabolic nodal activity in the right axilla at the previous resection site. -I have recommended pembrolizumab given MSI high status. -We reviewed side effects of pembrolizumab in detail including rare chance of colitis, pneumonitis, dermatitis, hypophysitis among others. -We will make referral for port placement.  2. Right Breast, Grade 2 Invasive Lobular Carcinoma: -I have reviewed management with anastrozole.  We discussed side effects of anastrozole including hot flashes, musculoskeletal symptoms and decreased bone mineral density.  Will consider checking bone mineral density.  3. Normocytic anemia: -Feraheme on 10/27/2019 and 11/03/2019. -Latest hemoglobin improved to 14.5.   Orders placed this encounter:  No orders of the defined types were placed in this encounter.  Total time spent is 40 minutes with more than 50% of the time spent face-to-face discussing scan results, treatment plan, counseling and coordination of care.  Derek Jack, MD [place] [phone]   I, Milinda Antis, am acting as a scribe for Dr. Sanda Linger.  I, Derek Jack MD, have reviewed the above documentation for accuracy and completeness, and I agree with the above.

## 2020-05-07 ENCOUNTER — Other Ambulatory Visit (HOSPITAL_COMMUNITY): Payer: Self-pay | Admitting: *Deleted

## 2020-05-07 DIAGNOSIS — C50911 Malignant neoplasm of unspecified site of right female breast: Secondary | ICD-10-CM

## 2020-05-07 NOTE — Patient Instructions (Signed)
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  You will be treated every 3 weeks with an immunotherapy medication called pembrolizumab Beryle Flock).  The intent of treatment is to help control your cancer, prevent it from spreading further, and to alleviate any symptoms you may be having related to your disease.  You will see the doctor regularly throughout treatment.  We will obtain blood work from you prior to every treatment and monitor your results to make sure it is safe to give your treatment. The doctor monitors your response to treatment by the way you are feeling, your blood work, and by obtaining scans periodically.  There will be wait times while you are here for treatment.  It will take about 30 minutes to 1 hour for your lab work to result.  Then there will be wait times while pharmacy mixes your medications.    Pembrolizumab Beryle Flock)   About This Drug Pembrolizumab is used to treat cancer. It is given in the vein (IV).  It takes 30 minutes to infuse.  Possible Side Effects  . Nausea . Diarrhea (loose bowel movements) . Constipation (not able to move bowels) . Pain in your abdomen . Tiredness . Fever . Decreased appetite (decreased hunger) . Muscle and bone pain . Trouble breathing . Cough . Rash . Itching  Note: Each of the side effects above was reported in 20% or greater of patients treated with pembrolizumab. Your side effects may be different if you are receiving pembrolizumab in combination with another chemotherapy agent. Not all possible side effects are included above.  Warnings and Precautions  . This drug works with your immune system and can cause inflammation in any of your organs and tissues and can change how they work. This may put you at risk for developing serious medical problems, which can be life-threatening.  . Inflammation in the colon (colitis), which can be life-threatening - symptoms  are loose bowel movements (diarrhea) stomach cramping, and sometimes blood in the bowel movements  . Changes in liver function  . Changes in kidney function  . Inflammation (swelling) of the lungs, which can be life-threatening. You may have a dry cough or trouble breathing.  . This drug may affect some of your hormone glands (especially the thyroid, adrenals, pituitary and pancreas).  . Blood sugar levels may change, and you may develop diabetes. If you already have diabetes, changes may need to be made to your diabetes medication.  . Severe allergic skin reaction, which can be life-threatening. You may develop blisters on your skin that are filled with fluid or a severe red rash all over your body that may be painful.  . Increased risk of organ rejection in patients who have received donor organs  . Increased risk of complications in patients who will undergo a stem cell transplant after receiving pembrolizumab.  . While you are getting this drug in your vein (IV), you may have a reaction to the drug. Your nurse ill check you closely for these signs: fever or shaking chills, flushing, facial swelling, feeling dizzy, headache, trouble breathing, rash, itching, chest tightness, or chest pain. These reactions may occur after your infusion. If this happens, call 911 for emergency care.  Note: Some of the side effects above are very rare. If you have concerns and/or questions, please discuss them with your medical team.  Important Information . This drug may be present in the saliva, tears, sweat, urine, stool, vomit, semen,  and vaginal secretions. Talk to your doctor and/or your nurse about the necessary precautions to take during this time.  Treating Side Effects  . Drink plenty of fluids (a minimum of eight glasses per day is recommended).  . If you throw up or have loose bowel movements, you should drink more fluids so that you do not become dehydrated (lack of water in the body from  losing too much fluid).  . To help with nausea and vomiting, eat small, frequent meals instead of three large meals a day. Choose foods and drinks that are at room temperature. Ask your nurse or doctor about other helpful tips and medicine that is available to help stop or lessen these symptoms.  . If you have diarrhea, eat low-fiber foods that are high in protein and calories and avoid foods that can irritate your digestive tracts or lead to cramping.  . If you are not able to move your bowels, check with your doctor or nurse before you use any enemas, laxatives, or suppositories.  . Ask your doctor or nurse about medicines that are available to help stop or lessen constipation or diarrhea.  . To help with decreased appetite, eat small, frequent meals. Eat foods high in calories and protein, such as meat, poultry, fish, dry beans, tofu, eggs, nuts, milk, yogurt, cheese, ice cream, pudding, and nutritional supplements.  . Consider using sauces and spices to increase taste. Daily exercise, with your doctor's approval, may increase your appetite.  . Manage tiredness by pacing your activities for the day. Be sure to include periods of rest between energy-draining activities.  Marland Kitchen Keeping your pain under control is important to your wellbeing. Please tell your doctor or nurse if you are experiencing pain.  . If you have diabetes, keep good control of your blood sugar level. Tell your nurse or your doctor if your glucose levels are higher or lower than normal.  . If you get a rash do not put anything on it unless your doctor or nurse says you may. Keep the area around the rash clean and dry. Ask your doctor for medicine if your rash bothers you.  . Moisturize your skin several times a day  . Avoid sun exposure and apply sunscreen routinely when outdoors  . Infusion reactions may happen after your infusion. If this happens, call 911 for emergency care.   Food and Drug Interactions  . There are  no known interactions of pembrolizumab with food.  . This drug may interact with other medicines. Tell your doctor and pharmacist about all the prescription and over-the-counter medicines and dietary supplements (vitamins, minerals, herbs and others) that you are taking at this time. Also, check with your doctor or pharmacist before starting any new prescription or over-the-counter medicines, or dietary supplements to make sure that there are no interactions.  When to Call the Doctor  Call your doctor or nurse if you have any of the following symptoms and/or any new or unusual symptoms:  . Fever of 100.4 F (38 C) or higher  . Chills  . Headache that does not go away  . Tiredness that interferes with your daily activities  . Feeling dizzy or lightheaded  . Wheezing or trouble breathing  . Chest pain  . Dry cough  . Coughing up yellow, green or bloody mucus  . Nausea that stops you from eating or drinking, and/or that is not relieved by prescribed medicines  . Lasting loss of appetite or rapid weight loss of five pounds  in a week  . Diarrhea, 4 times in one day or diarrhea with lack of strength or a feeling of being dizzy  . Blood in your stool  . No bowel movement for 3 days or when you feel uncomfortable  . Extreme weakness that interferes with normal activities  . Decreased urine  . Abnormal blood sugar  . Unusual thirst, passing urine often, headache, sweating, shakiness, irritability  . A new rash and/or itching  . Rash that is not relieved by prescribed medicines  . Pain that does not go away or is not relieved by prescribed medicines, especially in the upper right abdomen  . Flu-like symptoms: fever, headache, muscle and joint aches, and fatigue (low energy, feeling weak)   . Signs of liver problems: dark urine, pale bowel movements, bad stomach pain, feeling very tired and weak, unusual itching, or yellowing of the eyes or skin  . Signs of infusion  reactions such as fever or shaking chills, flushing, facial swelling, feeling dizzy, headache, trouble breathing, rash, itching, chest tightness, or chest pain. If this happens, call 911 for emergency care.  . If you think you are pregnant  Reproduction Warnings  . Pregnancy warning: This drug may have harmful effects on the unborn baby. Women of childbearing potential should use effective methods of birth control during your cancer treatment and for at least 4 months after treatment. Let your doctor know right away if you think you may be pregnant.  . Breast feeding warning: It is not known if this drug passes into breast milk. For this reason, women should not breastfeed during treatment and for 4 months after treatment because this drug could enter the breast milk and cause harm to a breastfeeding baby.  . Fertility warning: Human fertility studies have not been done with this drug. Talk with your doctor or nurse if you plan to have children.   SELF CARE ACTIVITIES WHILE ON IMMUNOTHERAPY:  Hydration Increase your fluid intake 48 hours prior to treatment and drink at least 8 to 12 cups (64 ounces) of water/decaffeinated beverages per day after treatment. You can still have your cup of coffee or soda but these beverages do not count as part of your 8 to 12 cups that you need to drink daily. No alcohol intake.  Medications Continue taking your normal prescription medication as prescribed.  If you start any new herbal or new supplements please let us know first to make sure it is safe.  Mouth Care Have teeth cleaned professionally before starting treatment. Keep dentures and partial plates clean. Use soft toothbrush and do not use mouthwashes that contain alcohol. Biotene is a good mouthwash that is available at most pharmacies or may be ordered by calling [phone]. Use warm salt water gargles (1 teaspoon salt per 1 quart warm water) before and after meals and at bedtime. Or you may rinse  with 2 tablespoons of three-percent hydrogen peroxide mixed in eight ounces of water. If you are still having problems with your mouth or sores in your mouth please call the clinic. If you need dental work, please let the doctor know before you go for your appointment so that we can coordinate the best possible time for you in regards to your chemo regimen. You need to also let your dentist know that you are actively taking chemo. We may need to do labs prior to your dental appointment.  Skin Care Always use sunscreen that has not expired and with SPF Nancy Fetter Protection Factor) of  50 or higher. Wear hats to protect your head from the sun. Remember to use sunscreen on your hands, ears, face, & feet.  Use good moisturizing lotions such as udder cream, eucerin, or even Vaseline. Some chemotherapies can cause dry skin, color changes in your skin and nails.    . Avoid long, hot showers or baths. . Use gentle, fragrance-free soaps and laundry detergent. . Use moisturizers, preferably creams or ointments rather than lotions because the thicker consistency is better at preventing skin dehydration. Apply the cream or ointment within 15 minutes of showering. Reapply moisturizer at night, and moisturize your hands every time after you wash them.   Infection Prevention Please wash your hands for at least 30 seconds using warm soapy water. Handwashing is the #1 way to prevent the spread of germs. Stay away from sick people or people who are getting over a cold. If you develop respiratory systems such as green/yellow mucus production or productive cough or persistent cough let us know and we will see if you need an antibiotic. It is a good idea to keep a pair of gloves on when going into grocery stores/[employer] to decrease your risk of coming into contact with germs on the carts, etc. Carry alcohol hand gel with you at all times and use it frequently if out in public. If your temperature reaches 100.5 or higher please  call the clinic and let us know.  If it is after hours or on the weekend please go to the ER if your temperature is over 100.4.  Please have your own personal thermometer at home to use.    Sex and bodily fluids If you are going to have sex, a condom must be used to protect the person that isn't taking immunotherapy. For a few days after treatment, immunotherapy can be excreted through your bodily fluids.  When using the toilet please close the lid and flush the toilet twice.  Do this for a few day after you have had immunotherapy.   Contraception It is not known for sure whether or not immunotherapy drugs can be passed on through semen or secretions from the vagina. Because of this some doctors advise people to use a barrier method if you have sex during treatment. This applies to vaginal, anal or oral sex.  Generally, doctors advise a barrier method only for the time you are actually having the treatment and for about a week after your treatment.  Advice like this can be worrying, but this does not mean that you have to avoid being intimate with your partner. You can still have close contact with your partner and continue to enjoy sex.  Animals If you have cats or birds we just ask that you not change the litter or change the cage.  Please have someone else do this for you while you are on immunotherapy.   Food Safety During and After Cancer Treatment Food safety is important for people both during and after cancer treatment. Cancer and cancer treatments, such as chemotherapy, radiation therapy, and stem cell/bone marrow transplantation, often weaken the immune system. This makes it harder for your body to protect itself from foodborne illness, also called food poisoning. Foodborne illness is caused by eating food that contains harmful bacteria, parasites, or viruses.  Foods to avoid Some foods have a higher risk of becoming tainted with bacteria. These include: Marland Kitchen Unwashed fresh fruit and  vegetables, especially leafy vegetables that can hide dirt and other contaminants  Raw sprouts, such as  alfalfa sprouts . Raw or undercooked beef, especially ground beef, or other raw or undercooked meat and poultry . Fatty, fried, or spicy foods immediately before or after treatment.  These can sit heavy on your stomach and make you feel nauseous. . Raw or undercooked shellfish, such as oysters. . Sushi and sashimi, which often contain raw fish.  . Unpasteurized beverages, such as unpasteurized fruit juices, raw milk, raw yogurt, or cider . Undercooked eggs, such as soft boiled, over easy, and poached; raw, unpasteurized eggs; or foods made with raw egg, such as homemade raw cookie dough and homemade mayonnaise  Simple steps for food safety  Shop smart. . Do not buy food stored or displayed in an unclean area. . Do not buy bruised or damaged fruits or vegetables. . Do not buy cans that have cracks, dents, or bulges. . Pick up foods that can spoil at the end of your shopping trip and store them in a cooler on the way home.  Prepare and clean up foods carefully. . Rinse all fresh fruits and vegetables under running water, and dry them with a clean towel or paper towel. . Clean the top of cans before opening them. . After preparing food, wash your hands for 20 seconds with hot water and soap. Pay special attention to areas between fingers and under nails. . Clean your utensils and dishes with hot water and soap. Marland Kitchen Disinfect your kitchen and cutting boards using 1 teaspoon of liquid, unscented bleach mixed into 1 quart of water.    Dispose of old food. . Eat canned and packaged food before its expiration date (the "use by" or "best before" date). . Consume refrigerated leftovers within 3 to 4 days. After that time, throw out the food. Even if the food does not smell or look spoiled, it still may be unsafe. Some bacteria, such as Listeria, can grow even on foods stored in the refrigerator if  they are kept for too long.  Take precautions when eating out. . At restaurants, avoid buffets and salad bars where food sits out for a long time and comes in contact with many people. Food can become contaminated when someone with a virus, often a norovirus, or another "bug" handles it. . Put any leftover food in a "to-go" container yourself, rather than having the server do it. And, refrigerate leftovers as soon as you get home. . Choose restaurants that are clean and that are willing to prepare your food as you order it cooked.    SYMPTOMS TO REPORT AS SOON AS POSSIBLE AFTER TREATMENT:   FEVER GREATER THAN 100.4 F  CHILLS WITH OR WITHOUT FEVER  NAUSEA AND VOMITING THAT IS NOT CONTROLLED WITH YOUR NAUSEA MEDICATION  UNUSUAL SHORTNESS OF BREATH  UNUSUAL BRUISING OR BLEEDING  TENDERNESS IN MOUTH AND THROAT WITH OR WITHOUT PRESENCE OF ULCERS  URINARY PROBLEMS  BOWEL PROBLEMS  UNUSUAL RASH     Wear comfortable clothing and clothing appropriate for easy access to any Portacath or PICC line. Let us know if there is anything that we can do to make your therapy better!   What to do if you need assistance after hours or on the weekends: CALL [phone].  HOLD on the line, do not hang up.  You will hear multiple messages but at the end you will be connected with a nurse triage line.  They will contact the doctor if necessary.  Most of the time they will be able to assist you.  Do not  call the hospital operator.     I have been informed and understand all of the instructions given to me and have received a copy. I have been instructed to call the clinic [phone] or my family physician as soon as possible for continued medical care, if indicated. I do not have any more questions at this time but understand that I may call the [facility] or the Patient Navigator at [phone] during office hours should I have questions or need assistance in obtaining follow-up care.

## 2020-05-09 ENCOUNTER — Other Ambulatory Visit: Payer: Self-pay

## 2020-05-09 ENCOUNTER — Inpatient Hospital Stay (HOSPITAL_COMMUNITY): Payer: Medicare Other

## 2020-05-09 NOTE — Progress Notes (Signed)
Immunotherapy education packet given and discussed with pt and family in detail.  Discussed diagnosis, staging, tx regimen, and intent of tx.  Reviewed immunotherapy medications and side effects.  Instructed on how to manage side effects at home, and when to call the clinic.  Importance of fever/chills discussed with pt and family. Discussed precautions to implement at home after receiving tx, as well as self care strategies. Phone numbers provided for clinic during regular working hours, also how to reach the clinic after hours and on weekends. Pt provided the opportunity to ask questions - all questions answered to pt's and family's satisfaction.    

## 2020-05-10 NOTE — Progress Notes (Signed)
.   Pharmacist Chemotherapy Monitoring - Initial Assessment    Anticipated start date: 05/14/20   Regimen:  . Are orders appropriate based on the patient's diagnosis, regimen, and cycle? Yes . Does the plan date match the patient's scheduled date? Yes . Is the sequencing of drugs appropriate? Yes . Are the premedications appropriate for the patient's regimen? Yes . Prior Authorization for treatment is: Approved o If applicable, is the correct biosimilar selected based on the patient's insurance? not applicable  Organ Function and Labs: Marland Kitchen Are dose adjustments needed based on the patient's renal function, hepatic function, or hematologic function? No . Are appropriate labs ordered prior to the start of patient's treatment? Yes . Other organ system assessment, if indicated: N/A . The following baseline labs, if indicated, have been ordered: pembrolizumab: baseline TSH +/- T4  Dose Assessment: . Are the drug doses appropriate? Yes . Are the following correct: o Drug concentrations Yes o IV fluid compatible with drug Yes o Administration routes Yes o Timing of therapy Yes . If applicable, does the patient have documented access for treatment and/or plans for port-a-cath placement? yes . If applicable, have lifetime cumulative doses been properly documented and assessed? not applicable Lifetime Dose Tracking  No doses have been documented on this patient for the following tracked chemicals: Doxorubicin, Epirubicin, Idarubicin, Daunorubicin, Mitoxantrone, Bleomycin, Oxaliplatin, Carboplatin, Liposomal Doxorubicin  o   Toxicity Monitoring/Prevention: . The patient has the following take home antiemetics prescribed: N/A . The patient has the following take home medications prescribed: N/A . Medication allergies and previous infusion related reactions, if applicable, have been reviewed and addressed. Yes . The patient's current medication list has been assessed for drug-drug interactions with  their chemotherapy regimen. no significant drug-drug interactions were identified on review.  Order Review: . Are the treatment plan orders signed? Yes . Is the patient scheduled to see a provider prior to their treatment? No  I verify that I have reviewed each item in the above checklist and answered each question accordingly.  Wynona Neat 05/10/2020 9:46 AM

## 2020-05-14 ENCOUNTER — Inpatient Hospital Stay (HOSPITAL_COMMUNITY): Payer: Medicare Other

## 2020-05-14 ENCOUNTER — Other Ambulatory Visit: Payer: Self-pay

## 2020-05-14 VITALS — BP 156/70 | HR 55 | Temp 97.7°F | Resp 18 | Wt 154.0 lb

## 2020-05-14 DIAGNOSIS — C182 Malignant neoplasm of ascending colon: Secondary | ICD-10-CM

## 2020-05-14 DIAGNOSIS — C50911 Malignant neoplasm of unspecified site of right female breast: Secondary | ICD-10-CM

## 2020-05-14 LAB — COMPREHENSIVE METABOLIC PANEL
ALT: 17 U/L (ref 0–44)
AST: 19 U/L (ref 15–41)
Albumin: 4 g/dL (ref 3.5–5.0)
Alkaline Phosphatase: 83 U/L (ref 38–126)
Anion gap: 10 (ref 5–15)
BUN: 26 mg/dL — ABNORMAL HIGH (ref 8–23)
CO2: 29 mmol/L (ref 22–32)
Calcium: 9.9 mg/dL (ref 8.9–10.3)
Chloride: 102 mmol/L (ref 98–111)
Creatinine, Ser: 1.49 mg/dL — ABNORMAL HIGH (ref 0.44–1.00)
GFR calc Af Amer: 38 mL/min — ABNORMAL LOW (ref >60)
GFR calc non Af Amer: 33 mL/min — ABNORMAL LOW (ref >60)
Glucose, Bld: 88 mg/dL (ref 70–99)
Potassium: 4.2 mmol/L (ref 3.5–5.1)
Sodium: 141 mmol/L (ref 135–145)
Total Bilirubin: 0.7 mg/dL (ref 0.3–1.2)
Total Protein: 6.9 g/dL (ref 6.5–8.1)

## 2020-05-14 LAB — CBC WITH DIFFERENTIAL/PLATELET
Abs Immature Granulocytes: 0.01 10*3/uL (ref 0.00–0.07)
Basophils Absolute: 0.1 10*3/uL (ref 0.0–0.1)
Basophils Relative: 1 %
Eosinophils Absolute: 0 10*3/uL (ref 0.0–0.5)
Eosinophils Relative: 1 %
HCT: 47.1 % — ABNORMAL HIGH (ref 36.0–46.0)
Hemoglobin: 14.4 g/dL (ref 12.0–15.0)
Immature Granulocytes: 0 %
Lymphocytes Relative: 23 %
Lymphs Abs: 1.3 10*3/uL (ref 0.7–4.0)
MCH: 29.3 pg (ref 26.0–34.0)
MCHC: 30.6 g/dL (ref 30.0–36.0)
MCV: 95.7 fL (ref 80.0–100.0)
Monocytes Absolute: 0.5 10*3/uL (ref 0.1–1.0)
Monocytes Relative: 8 %
Neutro Abs: 3.7 10*3/uL (ref 1.7–7.7)
Neutrophils Relative %: 67 %
Platelets: 169 10*3/uL (ref 150–400)
RBC: 4.92 MIL/uL (ref 3.87–5.11)
RDW: 14.2 % (ref 11.5–15.5)
WBC: 5.5 10*3/uL (ref 4.0–10.5)
nRBC: 0 % (ref 0.0–0.2)

## 2020-05-14 LAB — TSH: TSH: 6.081 u[IU]/mL — ABNORMAL HIGH (ref 0.350–4.500)

## 2020-05-14 MED ORDER — PROCHLORPERAZINE MALEATE 10 MG PO TABS: 10.0000 mg | ORAL_TABLET | Freq: Four times a day (QID) | ORAL | 1 refills | Status: DC | PRN

## 2020-05-14 MED ORDER — SODIUM CHLORIDE 0.9 % IV SOLN
200.0000 mg | Freq: Once | INTRAVENOUS | Status: AC
  Administered 2020-05-14: 200 mg via INTRAVENOUS
  Filled 2020-05-14: qty 8

## 2020-05-14 MED ORDER — LIDOCAINE-PRILOCAINE 2.5-2.5 % EX CREA: TOPICAL_CREAM | CUTANEOUS | 3 refills | Status: DC

## 2020-05-14 MED ORDER — SODIUM CHLORIDE 0.9 % IV SOLN: Freq: Once | INTRAVENOUS | Status: AC

## 2020-05-14 NOTE — Progress Notes (Signed)
Jessica Taylor presents today for [employer]. Pt denies any new changes or symptoms since last seen at the clinic. Lab results and vitals have been reviewed and are stable and within parameters for treatment.   Infusion tolerated without incident or complaint. VSS upon completion of treatment. IV flushed and noted for positive blood return by 2 RNs, see MAR and IV flowsheet for details. Discharged in satisfactory condition with follow up instructions.

## 2020-05-14 NOTE — Patient Instructions (Signed)
[facility] Discharge Instructions for Patients Receiving Chemotherapy   Beginning January 23rd 2017 lab work for the Cancer Center will be done in the  Main lab at [facility] on 1st floor. If you have a lab appointment with the Cancer Center please come in thru the  Main Entrance and check in at the main information desk   Today you received the following chemotherapy agents Keytruda  To help prevent nausea and vomiting after your treatment, we encourage you to take your nausea medication   If you develop nausea and vomiting, or diarrhea that is not controlled by your medication, call the clinic.  The clinic phone number is [phone]. Office hours are Monday-Friday 8:30am-5:00pm.  BELOW ARE SYMPTOMS THAT SHOULD BE REPORTED IMMEDIATELY:  *FEVER GREATER THAN 101.0 F  *CHILLS WITH OR WITHOUT FEVER  NAUSEA AND VOMITING THAT IS NOT CONTROLLED WITH YOUR NAUSEA MEDICATION  *UNUSUAL SHORTNESS OF BREATH  *UNUSUAL BRUISING OR BLEEDING  TENDERNESS IN MOUTH AND THROAT WITH OR WITHOUT PRESENCE OF ULCERS  *URINARY PROBLEMS  *BOWEL PROBLEMS  UNUSUAL RASH Items with * indicate a potential emergency and should be followed up as soon as possible. If you have an emergency after office hours please contact your primary care physician or go to the nearest emergency department.  Please call the clinic during office hours if you have any questions or concerns.   You may also contact the Patient Navigator at [phone] should you have any questions or need assistance in obtaining follow up care.      Resources For Cancer Patients and their Caregivers ? American Cancer Society: Can assist with transportation, wigs, general needs, runs Look Good Feel Better.        1-888-227-6333 ? Cancer Care: Provides financial assistance, online support groups, medication/co-pay assistance.  1-800-813-HOPE (4673) ? [facility] Assists [place] cancer  patients and their families through emotional , educational and financial support.  336-427-4357 ? [place] DSS Where to apply for food stamps, Medicaid and utility assistance. 336-342-1394 ? [facility]: Transportation to medical appointments. 336-347-2287 ? Social Security Administration: May apply for disability if have a Stage IV cancer. 336-342-7796 1-800-772-1213 ? [place] [facility]ging, Disability and Transit Services: Assists with nutrition, care and transit needs. 336-349-2343          

## 2020-05-15 ENCOUNTER — Telehealth (HOSPITAL_COMMUNITY): Payer: Self-pay

## 2020-05-15 LAB — CEA: CEA: 12.7 ng/mL — ABNORMAL HIGH (ref 0.0–4.7)

## 2020-05-15 NOTE — Telephone Encounter (Signed)
24 hour follow up-called to check on patient today. Stated she has a "rough morning" aching feeling , no energy. She states she is feeling better this afternoon, no other complaints.

## 2020-05-16 ENCOUNTER — Ambulatory Visit (INDEPENDENT_AMBULATORY_CARE_PROVIDER_SITE_OTHER): Payer: Medicare Other | Admitting: General Surgery

## 2020-05-16 ENCOUNTER — Other Ambulatory Visit: Payer: Self-pay

## 2020-05-16 ENCOUNTER — Encounter: Payer: Self-pay | Admitting: General Surgery

## 2020-05-16 VITALS — BP 141/85 | HR 55 | Temp 97.4°F | Resp 12 | Ht 63.0 in | Wt 155.0 lb

## 2020-05-16 DIAGNOSIS — C50911 Malignant neoplasm of unspecified site of right female breast: Secondary | ICD-10-CM | POA: Diagnosis not present

## 2020-05-16 NOTE — Patient Instructions (Signed)

## 2020-05-17 NOTE — Progress Notes (Signed)
Jessica Taylor; [id number]; 1941/04/06   HPI Patient is an 79 year old white female who was referred to my care by Dr. Delton Coombes for Port-A-Cath insertion.  She has lobular carcinoma of the right breast and is undergoing chemotherapy.  She is on Plavix for anticoagulation.   Past Medical History:  Diagnosis Date   Abdominal aortic aneurysm (HCC)    Acid reflux    Anemia    Arthritis    knees , R shoulder - tx /w injection - 11/2014   Basal cell carcinoma (BCC) of dorsum of nose 2010   Resolved   Cancer ([place])    basal cell on nose   Colon cancer (HCC)    Coronary artery disease    Hypercholesteremia    Hypertension    Hypothyroidism    Lt Acute pyelonephritis 09/11/2018   MI, old 2000   Peripheral arterial disease ([place])    hhigh-grade ostial bilateral calcified iliac stenosis with claudication   Tobacco abuse    Vertigo    when lays on left side.    Past Surgical History:  Procedure Laterality Date   BREAST LUMPECTOMY WITH RADIOACTIVE SEED AND SENTINEL LYMPH NODE BIOPSY Bilateral 04/02/2020   Procedure: BILATERAL BREAST LUMPECTOMY WITH RADIOACTIVE SEED AND RIGHT SENTINEL LYMPH NODE BIOPSY AND RIGHT TARGETED AXILLARY LYMPH NODE BIOPSY;  Surgeon: Erroll Luna, MD;  Location: [place];  Service: General;  Laterality: Bilateral;   CARDIAC CATHETERIZATION  5 stents   CARDIAC CATHETERIZATION N/A 01/20/2016   Procedure: Left Heart Cath and Coronary Angiography;  Surgeon: Peter M Martinique, MD;  Location: [place] [facility];  Service: Cardiovascular;  Laterality: N/A;   CATARACT EXTRACTION W/PHACO Left 05/24/2014   Procedure: CATARACT EXTRACTION PHACO AND INTRAOCULAR LENS PLACEMENT (IOC);  Surgeon: Tonny Branch, MD;  Location: [facility];  Service: Ophthalmology;  Laterality: Left;  CDE:  9.30   CATARACT EXTRACTION W/PHACO Right 06/18/2014   Procedure: CATARACT EXTRACTION PHACO AND INTRAOCULAR LENS PLACEMENT RIGHT EYE CDE=10.84;  Surgeon: Tonny Branch, MD;   Location: [facility];  Service: Ophthalmology;  Laterality: Right;   CORONARY ARTERY BYPASS GRAFT N/A 01/24/2016   Procedure: CORONARY ARTERY BYPASS GRAFTING (CABG);  Surgeon: Gaye Pollack, MD;  Location: [facility];  Service: Open Heart Surgery;  Laterality: N/A;   CORONARY STENT PLACEMENT     CORONARY STENT PLACEMENT  03/06/15   CFX DES   CYSTOSCOPY/URETEROSCOPY/HOLMIUM LASER/STENT PLACEMENT Left 09/12/2018   Procedure: CYSTOSCOPY/URETEROSCOPY/STENT PLACEMENT;  Surgeon: Ceasar Mons, MD;  Location: [facility];  Service: Urology;  Laterality: Left;   EYE SURGERY     LEFT HEART CATH AND CORS/GRAFTS ANGIOGRAPHY N/A 09/12/2019   Procedure: LEFT HEART CATH AND CORS/GRAFTS ANGIOGRAPHY;  Surgeon: Troy Sine, MD;  Location: [place] [facility];  Service: Cardiovascular;  Laterality: N/A;   LEFT HEART CATHETERIZATION WITH CORONARY ANGIOGRAM N/A 03/06/2015   Procedure: LEFT HEART CATHETERIZATION WITH CORONARY ANGIOGRAM;  Surgeon: Lorretta Harp, MD;  Location: [facility];  Service: Cardiovascular;  Laterality: N/A;   PARTIAL KNEE ARTHROPLASTY Right 02/04/2015   Procedure: UNICOMPARTMENTAL KNEE;  Surgeon: Dorna Leitz, MD;  Location: [place];  Service: Orthopedics;  Laterality: Right;   PERIPHERAL VASCULAR CATHETERIZATION Bilateral 05/13/2015   Procedure: Lower Extremity Angiography;  Surgeon: Lorretta Harp, MD;  Location: [name] [facility];  Service: Cardiovascular;  Laterality: Bilateral;   PERIPHERAL VASCULAR CATHETERIZATION N/A 05/13/2015   Procedure: Abdominal Aortogram;  Surgeon: Lorretta Harp, MD;  Location: [place] [facility];  Service: Cardiovascular;  Laterality: N/A;   PERIPHERAL VASCULAR CATHETERIZATION Bilateral 06/27/2015   Procedure: Peripheral Vascular Intervention;  Surgeon: Lorretta Harp, MD;  Location: [place] [facility];  Service: Cardiovascular;  Laterality: Bilateral;  ILIACS   PERIPHERAL VASCULAR CATHETERIZATION Bilateral 06/27/2015   Procedure: Peripheral  Vascular Atherectomy;  Surgeon: Lorretta Harp, MD;  Location: [place] [facility];  Service: Cardiovascular;  Laterality: Bilateral;   TEE WITHOUT CARDIOVERSION N/A 01/24/2016   Procedure: TRANSESOPHAGEAL ECHOCARDIOGRAM (TEE);  Surgeon: Gaye Pollack, MD;  Location: [place];  Service: Open Heart Surgery;  Laterality: N/A;   TONSILLECTOMY     age 41   TUBAL LIGATION      Family History  Problem Relation Age of Onset   Ovarian cancer Mother 41   Cancer Father 74       unsure of which kind, "it was in his glands"   Hypertension Maternal Grandmother    Stroke Maternal Grandfather    Hypertension Son    Brain cancer Sister    Hypertension Daughter    Heart attack Neg Hx    Colon cancer Neg Hx    Esophageal cancer Neg Hx    Inflammatory bowel disease Neg Hx    Liver disease Neg Hx    Pancreatic cancer Neg Hx    Rectal cancer Neg Hx    Stomach cancer Neg Hx     Current Outpatient Medications on File Prior to Visit  Medication Sig Dispense Refill   anastrozole (ARIMIDEX) 1 MG tablet Take 1 tablet (1 mg total) by mouth daily. 30 tablet 6   aspirin EC 81 MG tablet Take 81 mg by mouth every evening.      atorvastatin (LIPITOR) 40 MG tablet TAKE (1) TABLET BY MOUTH ONCE DAILY. (Patient taking differently: Take 40 mg by mouth daily. ) 90 tablet 3   clopidogrel (PLAVIX) 75 MG tablet TAKE (1) TABLET BY MOUTH ONCE DAILY. (Patient taking differently: Take 75 mg by mouth daily. ) 90 tablet 3   esomeprazole (NEXIUM) 20 MG capsule Take 20 mg by mouth daily.      HYDROcodone-acetaminophen (NORCO/VICODIN) 5-325 MG tablet Take 1 tablet by mouth every 6 (six) hours as needed for moderate pain. 15 tablet 0   levothyroxine (SYNTHROID, LEVOTHROID) 50 MCG tablet Take 50 mcg by mouth daily before breakfast.     lidocaine-prilocaine (EMLA) cream Apply to affected area once (Patient taking differently: Apply 1 application topically daily as needed (port access). ) 30 g 3   metoprolol  tartrate (LOPRESSOR) 25 MG tablet Take 1 tablet (25 mg total) by mouth 2 (two) times daily. 180 tablet 3   nitroGLYCERIN (NITROSTAT) 0.4 MG SL tablet Place 1 tablet (0.4 mg total) under the tongue every 5 (five) minutes as needed for chest pain. 25 tablet prn   Pembrolizumab (KEYTRUDA IV) Inject 200 mg into the vein every 21 ( twenty-one) days.      prochlorperazine (COMPAZINE) 10 MG tablet Take 1 tablet (10 mg total) by mouth every 6 (six) hours as needed (Nausea or vomiting). 30 tablet 1   traMADol (ULTRAM) 50 MG tablet Take 50 mg by mouth every 8 (eight) hours as needed for moderate pain.      No current facility-administered medications on file prior to visit.    Allergies  Allergen Reactions   Crab [Shellfish Allergy] Nausea And Vomiting    Throws up violently   Other Nausea And Vomiting    Shrimp    Social History   Substance and Sexual  Activity  Alcohol Use No    Social History   Tobacco Use  Smoking Status Former Smoker   Packs/day: 1.50   Years: 42.00   Pack years: 63.00   Types: Cigarettes   Quit date: 05/19/1999   Years since quitting: 21.0  Smokeless Tobacco Never Used    Review of Systems  Constitutional: Negative.   HENT: Negative.   Eyes: Negative.   Respiratory: Negative.   Cardiovascular: Negative.   Gastrointestinal: Positive for heartburn.  Genitourinary: Negative.   Musculoskeletal: Positive for back pain and joint pain.  Skin: Negative.   Neurological: Negative.   Endo/Heme/Allergies: Bruises/bleeds easily.  Psychiatric/Behavioral: Negative.     Objective   Vitals:   05/16/20 1055  BP: (!) 141/85  Pulse: (!) 55  Resp: 12  Temp: (!) 97.4 F (36.3 C)  SpO2: 93%    Physical Exam Vitals reviewed.  Constitutional:      Appearance: Normal appearance. She is normal weight. She is not ill-appearing.  HENT:     Head: Normocephalic and atraumatic.  Cardiovascular:     Rate and Rhythm: Regular rhythm.     Heart sounds: Murmur  heard.  No friction rub. No gallop.      Comments: 2 out of 6 systolic ejection murmur along the left parasternal border. Pulmonary:     Effort: Pulmonary effort is normal. No respiratory distress.     Breath sounds: Normal breath sounds. No stridor. No wheezing, rhonchi or rales.  Skin:    General: Skin is warm and dry.  Neurological:     Mental Status: She is alert and oriented to person, place, and time.   Oncology notes reviewed  Assessment  Right breast cancer, need for central venous access Plan   Patient is scheduled for Port-A-Cath insertion on 05/24/2020.  The risks and benefits of the procedure including bleeding, infection, and pneumothorax were fully explained to the patient, who gave informed consent.  She will stop her Plavix 6 days prior to the procedure.

## 2020-05-17 NOTE — H&P (Signed)
Jessica Taylor; [id number]; July 05, 1941   HPI Patient is an 79 year old white female who was referred to my care by Dr. Delton Coombes for Port-A-Cath insertion.  She has lobular carcinoma of the right breast and is undergoing chemotherapy.  She is on Plavix for anticoagulation.   Past Medical History:  Diagnosis Date  . Abdominal aortic aneurysm ([place])   . Acid reflux   . Anemia   . Arthritis    knees , R shoulder - tx /w injection - 11/2014  . Basal cell carcinoma (BCC) of dorsum of nose 2010   Resolved  . Cancer ([place])    basal cell on nose  . Colon cancer ([place])   . Coronary artery disease   . Hypercholesteremia   . Hypertension   . Hypothyroidism   . Lt Acute pyelonephritis 09/11/2018  . MI, old 32  . Peripheral arterial disease (HCC)    hhigh-grade ostial bilateral calcified iliac stenosis with claudication  . Tobacco abuse   . Vertigo    when lays on left side.    Past Surgical History:  Procedure Laterality Date  . BREAST LUMPECTOMY WITH RADIOACTIVE SEED AND SENTINEL LYMPH NODE BIOPSY Bilateral 04/02/2020   Procedure: BILATERAL BREAST LUMPECTOMY WITH RADIOACTIVE SEED AND RIGHT SENTINEL LYMPH NODE BIOPSY AND RIGHT TARGETED AXILLARY LYMPH NODE BIOPSY;  Surgeon: Erroll Luna, MD;  Location: [facility];  Service: General;  Laterality: Bilateral;  . CARDIAC CATHETERIZATION  5 stents  . CARDIAC CATHETERIZATION N/A 01/20/2016   Procedure: Left Heart Cath and Coronary Angiography;  Surgeon: Peter M Martinique, MD;  Location: [place] [facility];  Service: Cardiovascular;  Laterality: N/A;  . CATARACT EXTRACTION W/PHACO Left 05/24/2014   Procedure: CATARACT EXTRACTION PHACO AND INTRAOCULAR LENS PLACEMENT (IOC);  Surgeon: Tonny Branch, MD;  Location: [facility];  Service: Ophthalmology;  Laterality: Left;  CDE:  9.30  . CATARACT EXTRACTION W/PHACO Right 06/18/2014   Procedure: CATARACT EXTRACTION PHACO AND INTRAOCULAR LENS PLACEMENT RIGHT EYE CDE=10.84;  Surgeon: Tonny Branch, MD;   [facility];  Service: Ophthalmology;  Laterality: Right;  . CORONARY ARTERY BYPASS GRAFT N/A 01/24/2016   Procedure: CORONARY ARTERY BYPASS GRAFTING (CABG);  Surgeon: Gaye Pollack, MD;  Location: [place];  Service: Open Heart Surgery;  Laterality: N/A;  . CORONARY STENT PLACEMENT    . CORONARY STENT PLACEMENT  03/06/15   CFX DES  . CYSTOSCOPY/URETEROSCOPY/HOLMIUM LASER/STENT PLACEMENT Left 09/12/2018   Procedure: CYSTOSCOPY/URETEROSCOPY/STENT PLACEMENT;  Surgeon: Ceasar Mons, MD;  [facility]: [facility];  Service: Urology;  Laterality: Left;  . EYE SURGERY    . LEFT HEART CATH AND CORS/GRAFTS ANGIOGRAPHY N/A 09/12/2019   Procedure: LEFT HEART CATH AND CORS/GRAFTS ANGIOGRAPHY;  Surgeon: Troy Sine, MD;  Location: [place] [facility];  Service: Cardiovascular;  Laterality: N/A;  . LEFT HEART CATHETERIZATION WITH CORONARY ANGIOGRAM N/A 03/06/2015   Procedure: LEFT HEART CATHETERIZATION WITH CORONARY ANGIOGRAM;  Surgeon: Lorretta Harp, MD;  Location: [facility] CATH LAB;  Service: Cardiovascular;  Laterality: N/A;  . PARTIAL KNEE ARTHROPLASTY Right 02/04/2015   Procedure: UNICOMPARTMENTAL KNEE;  Surgeon: Dorna Leitz, MD;  Location: [place];  Service: Orthopedics;  Laterality: Right;  . PERIPHERAL VASCULAR CATHETERIZATION Bilateral 05/13/2015   Procedure: Lower Extremity Angiography;  Surgeon: Lorretta Harp, MD;  Location: [place] [facility];  Service: Cardiovascular;  Laterality: Bilateral;  . PERIPHERAL VASCULAR CATHETERIZATION N/A 05/13/2015   Procedure: Abdominal Aortogram;  Surgeon: Lorretta Harp, MD;  Location: [place] [facility];  Service: Cardiovascular;  Laterality: N/A;  . PERIPHERAL VASCULAR CATHETERIZATION Bilateral 06/27/2015   Procedure: Peripheral Vascular Intervention;  Surgeon: Lorretta Harp, MD;  Location: [place] [facility];  Service: Cardiovascular;  Laterality: Bilateral;  ILIACS  . PERIPHERAL VASCULAR CATHETERIZATION Bilateral 06/27/2015   Procedure: Peripheral  Vascular Atherectomy;  Surgeon: Lorretta Harp, MD;  Location: [place] [facility];  Service: Cardiovascular;  Laterality: Bilateral;  . TEE WITHOUT CARDIOVERSION N/A 01/24/2016   Procedure: TRANSESOPHAGEAL ECHOCARDIOGRAM (TEE);  Surgeon: Gaye Pollack, MD;  Location: [place];  Service: Open Heart Surgery;  Laterality: N/A;  . TONSILLECTOMY     age 15  . TUBAL LIGATION      Family History  Problem Relation Age of Onset  . Ovarian cancer Mother 75  . Cancer Father 44       unsure of which kind, "it was in his glands"  . Hypertension Maternal Grandmother   . Stroke Maternal Grandfather   . Hypertension Son   . Brain cancer Sister   . Hypertension Daughter   . Heart attack Neg Hx   . Colon cancer Neg Hx   . Esophageal cancer Neg Hx   . Inflammatory bowel disease Neg Hx   . Liver disease Neg Hx   . Pancreatic cancer Neg Hx   . Rectal cancer Neg Hx   . Stomach cancer Neg Hx     Current Outpatient Medications on File Prior to Visit  Medication Sig Dispense Refill  . anastrozole (ARIMIDEX) 1 MG tablet Take 1 tablet (1 mg total) by mouth daily. 30 tablet 6  . aspirin EC 81 MG tablet Take 81 mg by mouth every evening.     Marland Kitchen atorvastatin (LIPITOR) 40 MG tablet TAKE (1) TABLET BY MOUTH ONCE DAILY. (Patient taking differently: Take 40 mg by mouth daily. ) 90 tablet 3  . clopidogrel (PLAVIX) 75 MG tablet TAKE (1) TABLET BY MOUTH ONCE DAILY. (Patient taking differently: Take 75 mg by mouth daily. ) 90 tablet 3  . esomeprazole (NEXIUM) 20 MG capsule Take 20 mg by mouth daily.     Marland Kitchen HYDROcodone-acetaminophen (NORCO/VICODIN) 5-325 MG tablet Take 1 tablet by mouth every 6 (six) hours as needed for moderate pain. 15 tablet 0  . levothyroxine (SYNTHROID, LEVOTHROID) 50 MCG tablet Take 50 mcg by mouth daily before breakfast.    . lidocaine-prilocaine (EMLA) cream Apply to affected area once (Patient taking differently: Apply 1 application topically daily as needed (port access). ) 30 g 3  . metoprolol  tartrate (LOPRESSOR) 25 MG tablet Take 1 tablet (25 mg total) by mouth 2 (two) times daily. 180 tablet 3  . nitroGLYCERIN (NITROSTAT) 0.4 MG SL tablet Place 1 tablet (0.4 mg total) under the tongue every 5 (five) minutes as needed for chest pain. 25 tablet prn  . Pembrolizumab (KEYTRUDA IV) Inject 200 mg into the vein every 21 ( twenty-one) days.     . prochlorperazine (COMPAZINE) 10 MG tablet Take 1 tablet (10 mg total) by mouth every 6 (six) hours as needed (Nausea or vomiting). 30 tablet 1  . traMADol (ULTRAM) 50 MG tablet Take 50 mg by mouth every 8 (eight) hours as needed for moderate pain.      No current facility-administered medications on file prior to visit.    Allergies  Allergen Reactions  . Crab [Shellfish Allergy] Nausea And Vomiting    Throws up violently  . Other Nausea And Vomiting    Shrimp    Social History   Substance and Sexual  Activity  Alcohol Use No    Social History   Tobacco Use  Smoking Status Former Smoker  . Packs/day: 1.50  . Years: 42.00  . Pack years: 63.00  . Types: Cigarettes  . Quit date: 05/19/1999  . Years since quitting: 21.0  Smokeless Tobacco Never Used    Review of Systems  Constitutional: Negative.   HENT: Negative.   Eyes: Negative.   Respiratory: Negative.   Cardiovascular: Negative.   Gastrointestinal: Positive for heartburn.  Genitourinary: Negative.   Musculoskeletal: Positive for back pain and joint pain.  Skin: Negative.   Neurological: Negative.   Endo/Heme/Allergies: Bruises/bleeds easily.  Psychiatric/Behavioral: Negative.     Objective   Vitals:   05/16/20 1055  BP: (!) 141/85  Pulse: (!) 55  Resp: 12  Temp: (!) 97.4 F (36.3 C)  SpO2: 93%    Physical Exam Vitals reviewed.  Constitutional:      Appearance: Normal appearance. She is normal weight. She is not ill-appearing.  HENT:     Head: Normocephalic and atraumatic.  Cardiovascular:     Rate and Rhythm: Regular rhythm.     Heart sounds: Murmur  heard.  No friction rub. No gallop.      Comments: 2 out of 6 systolic ejection murmur along the left parasternal border. Pulmonary:     Effort: Pulmonary effort is normal. No respiratory distress.     Breath sounds: Normal breath sounds. No stridor. No wheezing, rhonchi or rales.  Skin:    General: Skin is warm and dry.  Neurological:     Mental Status: She is alert and oriented to person, place, and time.   Oncology notes reviewed  Assessment  Right breast cancer, need for central venous access Plan   Patient is scheduled for Port-A-Cath insertion on 05/24/2020.  The risks and benefits of the procedure including bleeding, infection, and pneumothorax were fully explained to the patient, who gave informed consent.  She will stop her Plavix 6 days prior to the procedure.

## 2020-05-21 ENCOUNTER — Encounter (HOSPITAL_COMMUNITY): Payer: Self-pay

## 2020-05-21 ENCOUNTER — Encounter (HOSPITAL_COMMUNITY)
Admission: RE | Admit: 2020-05-21 | Discharge: 2020-05-21 | Disposition: A | Discharge status: Home / Self Care | Payer: Medicare Other | Source: Ambulatory Visit | Attending: General Surgery | Admitting: General Surgery

## 2020-05-22 ENCOUNTER — Other Ambulatory Visit (HOSPITAL_COMMUNITY)
Admission: RE | Admit: 2020-05-22 | Discharge: 2020-05-22 | Disposition: A | Discharge status: Home / Self Care | Payer: Medicare Other | Source: Ambulatory Visit | Attending: General Surgery | Admitting: General Surgery

## 2020-05-22 ENCOUNTER — Other Ambulatory Visit: Payer: Self-pay

## 2020-05-22 DIAGNOSIS — Z20822 Contact with and (suspected) exposure to covid-19: Secondary | ICD-10-CM | POA: Insufficient documentation

## 2020-05-22 DIAGNOSIS — Z01812 Encounter for preprocedural laboratory examination: Secondary | ICD-10-CM | POA: Insufficient documentation

## 2020-05-22 LAB — SARS CORONAVIRUS 2 (TAT 6-24 HRS): SARS Coronavirus 2: NEGATIVE

## 2020-05-24 ENCOUNTER — Encounter (HOSPITAL_COMMUNITY)
Admission: RE | Disposition: A | Discharge status: Home / Self Care | Payer: Self-pay | Source: Home / Self Care | Attending: General Surgery

## 2020-05-24 ENCOUNTER — Ambulatory Visit (HOSPITAL_COMMUNITY): Payer: Medicare Other | Admitting: Certified Registered"

## 2020-05-24 ENCOUNTER — Other Ambulatory Visit: Payer: Self-pay

## 2020-05-24 ENCOUNTER — Ambulatory Visit (HOSPITAL_COMMUNITY)
Admission: RE | Admit: 2020-05-24 | Discharge: 2020-05-24 | Disposition: A | Discharge status: Home / Self Care | Payer: Medicare Other | Attending: General Surgery | Admitting: General Surgery

## 2020-05-24 ENCOUNTER — Ambulatory Visit (HOSPITAL_COMMUNITY): Payer: Medicare Other

## 2020-05-24 ENCOUNTER — Encounter (HOSPITAL_COMMUNITY): Payer: Self-pay | Admitting: General Surgery

## 2020-05-24 DIAGNOSIS — Z87891 Personal history of nicotine dependence: Secondary | ICD-10-CM | POA: Diagnosis not present

## 2020-05-24 DIAGNOSIS — Z951 Presence of aortocoronary bypass graft: Secondary | ICD-10-CM | POA: Insufficient documentation

## 2020-05-24 DIAGNOSIS — Z79890 Hormone replacement therapy (postmenopausal): Secondary | ICD-10-CM | POA: Diagnosis not present

## 2020-05-24 DIAGNOSIS — K219 Gastro-esophageal reflux disease without esophagitis: Secondary | ICD-10-CM | POA: Diagnosis not present

## 2020-05-24 DIAGNOSIS — Z79899 Other long term (current) drug therapy: Secondary | ICD-10-CM | POA: Diagnosis not present

## 2020-05-24 DIAGNOSIS — I10 Essential (primary) hypertension: Secondary | ICD-10-CM | POA: Diagnosis not present

## 2020-05-24 DIAGNOSIS — Z8249 Family history of ischemic heart disease and other diseases of the circulatory system: Secondary | ICD-10-CM | POA: Diagnosis not present

## 2020-05-24 DIAGNOSIS — Z955 Presence of coronary angioplasty implant and graft: Secondary | ICD-10-CM | POA: Diagnosis not present

## 2020-05-24 DIAGNOSIS — E7800 Pure hypercholesterolemia, unspecified: Secondary | ICD-10-CM | POA: Insufficient documentation

## 2020-05-24 DIAGNOSIS — E039 Hypothyroidism, unspecified: Secondary | ICD-10-CM | POA: Insufficient documentation

## 2020-05-24 DIAGNOSIS — I252 Old myocardial infarction: Secondary | ICD-10-CM | POA: Insufficient documentation

## 2020-05-24 DIAGNOSIS — Z95828 Presence of other vascular implants and grafts: Secondary | ICD-10-CM

## 2020-05-24 DIAGNOSIS — C50911 Malignant neoplasm of unspecified site of right female breast: Secondary | ICD-10-CM | POA: Diagnosis present

## 2020-05-24 DIAGNOSIS — Z7982 Long term (current) use of aspirin: Secondary | ICD-10-CM | POA: Diagnosis not present

## 2020-05-24 DIAGNOSIS — Z7902 Long term (current) use of antithrombotics/antiplatelets: Secondary | ICD-10-CM | POA: Diagnosis not present

## 2020-05-24 DIAGNOSIS — I739 Peripheral vascular disease, unspecified: Secondary | ICD-10-CM | POA: Insufficient documentation

## 2020-05-24 DIAGNOSIS — Z85038 Personal history of other malignant neoplasm of large intestine: Secondary | ICD-10-CM | POA: Insufficient documentation

## 2020-05-24 DIAGNOSIS — Z85828 Personal history of other malignant neoplasm of skin: Secondary | ICD-10-CM | POA: Diagnosis not present

## 2020-05-24 DIAGNOSIS — M1990 Unspecified osteoarthritis, unspecified site: Secondary | ICD-10-CM | POA: Insufficient documentation

## 2020-05-24 DIAGNOSIS — I2510 Atherosclerotic heart disease of native coronary artery without angina pectoris: Secondary | ICD-10-CM | POA: Insufficient documentation

## 2020-05-24 HISTORY — PX: PORTACATH PLACEMENT: SHX2246

## 2020-05-24 SURGERY — INSERTION, TUNNELED CENTRAL VENOUS DEVICE, WITH PORT
Anesthesia: General | Site: Chest | Laterality: Left

## 2020-05-24 MED ORDER — HEPARIN SOD (PORK) LOCK FLUSH 100 UNIT/ML IV SOLN
INTRAVENOUS | Status: AC
  Filled 2020-05-24: qty 5

## 2020-05-24 MED ORDER — ONDANSETRON HCL 4 MG/2ML IJ SOLN
INTRAMUSCULAR | Status: DC | PRN
  Administered 2020-05-24: 4 mg via INTRAVENOUS

## 2020-05-24 MED ORDER — LACTATED RINGERS IV SOLN
INTRAVENOUS | Status: DC
  Administered 2020-05-24: 1000 mL via INTRAVENOUS

## 2020-05-24 MED ORDER — CHLORHEXIDINE GLUCONATE 0.12 % MT SOLN
15.0000 mL | Freq: Once | OROMUCOSAL | Status: AC
  Administered 2020-05-24: 15 mL via OROMUCOSAL

## 2020-05-24 MED ORDER — DEXAMETHASONE SODIUM PHOSPHATE 10 MG/ML IJ SOLN
INTRAMUSCULAR | Status: AC
  Filled 2020-05-24: qty 1

## 2020-05-24 MED ORDER — CEFAZOLIN SODIUM-DEXTROSE 2-4 GM/100ML-% IV SOLN
2.0000 g | INTRAVENOUS | Status: AC
  Administered 2020-05-24: 2 g via INTRAVENOUS

## 2020-05-24 MED ORDER — PROPOFOL 500 MG/50ML IV EMUL
INTRAVENOUS | Status: DC | PRN
  Administered 2020-05-24: 50 ug/kg/min via INTRAVENOUS
  Administered 2020-05-24: 100 ug/kg/min via INTRAVENOUS

## 2020-05-24 MED ORDER — ONDANSETRON HCL 4 MG/2ML IJ SOLN
INTRAMUSCULAR | Status: AC
  Filled 2020-05-24: qty 2

## 2020-05-24 MED ORDER — LIDOCAINE HCL (CARDIAC) PF 100 MG/5ML IV SOSY
PREFILLED_SYRINGE | INTRAVENOUS | Status: DC | PRN
  Administered 2020-05-24: 40 mg via INTRAVENOUS

## 2020-05-24 MED ORDER — LIDOCAINE HCL (PF) 1 % IJ SOLN
INTRAMUSCULAR | Status: AC
  Filled 2020-05-24: qty 30

## 2020-05-24 MED ORDER — FENTANYL CITRATE (PF) 250 MCG/5ML IJ SOLN
INTRAMUSCULAR | Status: AC
  Filled 2020-05-24: qty 5

## 2020-05-24 MED ORDER — ORAL CARE MOUTH RINSE: 15.0000 mL | Freq: Once | OROMUCOSAL | Status: AC

## 2020-05-24 MED ORDER — CEFAZOLIN SODIUM-DEXTROSE 2-4 GM/100ML-% IV SOLN
INTRAVENOUS | Status: AC
  Filled 2020-05-24: qty 100

## 2020-05-24 MED ORDER — LIDOCAINE 2% (20 MG/ML) 5 ML SYRINGE
INTRAMUSCULAR | Status: AC
  Filled 2020-05-24: qty 5

## 2020-05-24 MED ORDER — FENTANYL CITRATE (PF) 100 MCG/2ML IJ SOLN
INTRAMUSCULAR | Status: DC | PRN
  Administered 2020-05-24 (×2): 25 ug via INTRAVENOUS

## 2020-05-24 MED ORDER — CHLORHEXIDINE GLUCONATE 0.12 % MT SOLN
OROMUCOSAL | Status: AC
  Filled 2020-05-24: qty 15

## 2020-05-24 MED ORDER — LIDOCAINE HCL (PF) 1 % IJ SOLN
INTRAMUSCULAR | Status: DC | PRN
  Administered 2020-05-24: 9 mL

## 2020-05-24 MED ORDER — HEPARIN SOD (PORK) LOCK FLUSH 100 UNIT/ML IV SOLN
INTRAVENOUS | Status: DC | PRN
  Administered 2020-05-24: 500 [IU] via INTRAVENOUS

## 2020-05-24 MED ORDER — FENTANYL CITRATE (PF) 100 MCG/2ML IJ SOLN
INTRAMUSCULAR | Status: AC
  Filled 2020-05-24: qty 2

## 2020-05-24 MED ORDER — CHLORHEXIDINE GLUCONATE CLOTH 2 % EX PADS: 6.0000 | MEDICATED_PAD | Freq: Once | CUTANEOUS | Status: DC

## 2020-05-24 MED ORDER — SODIUM CHLORIDE (PF) 0.9 % IJ SOLN
INTRAMUSCULAR | Status: DC | PRN
  Administered 2020-05-24: 10 mL via INTRAVENOUS

## 2020-05-24 MED ORDER — ROCURONIUM BROMIDE 10 MG/ML (PF) SYRINGE
PREFILLED_SYRINGE | INTRAVENOUS | Status: AC
  Filled 2020-05-24: qty 10

## 2020-05-24 MED ORDER — KETOROLAC TROMETHAMINE 30 MG/ML IJ SOLN
INTRAMUSCULAR | Status: AC
  Filled 2020-05-24: qty 1

## 2020-05-24 MED ORDER — KETOROLAC TROMETHAMINE 30 MG/ML IJ SOLN: 15.0000 mg | Freq: Once | INTRAMUSCULAR | Status: DC

## 2020-05-24 SURGICAL SUPPLY — 32 items
ADH SKN CLS APL DERMABOND .7 (GAUZE/BANDAGES/DRESSINGS) ×1
APL PRP STRL LF ISPRP CHG 10.5 (MISCELLANEOUS) ×1
APPLICATOR CHLORAPREP 10.5 ORG (MISCELLANEOUS) ×2 IMPLANT
BAG DECANTER FOR FLEXI CONT (MISCELLANEOUS) ×2 IMPLANT
CLOTH BEACON ORANGE TIMEOUT ST (SAFETY) ×2 IMPLANT
COVER LIGHT HANDLE STERIS (MISCELLANEOUS) ×4 IMPLANT
COVER WAND RF STERILE (DRAPES) ×2 IMPLANT
DECANTER SPIKE VIAL GLASS SM (MISCELLANEOUS) ×2 IMPLANT
DERMABOND ADVANCED (GAUZE/BANDAGES/DRESSINGS) ×1
DERMABOND ADVANCED .7 DNX12 (GAUZE/BANDAGES/DRESSINGS) ×1 IMPLANT
DRAPE C-ARM FOLDED MOBILE STRL (DRAPES) ×2 IMPLANT
ELECT REM PT RETURN 9FT ADLT (ELECTROSURGICAL) ×2
ELECTRODE REM PT RTRN 9FT ADLT (ELECTROSURGICAL) ×1 IMPLANT
GLOVE BIO SURGEON STRL SZ7 (GLOVE) ×1 IMPLANT
GLOVE BIOGEL PI IND STRL 7.0 (GLOVE) ×2 IMPLANT
GLOVE BIOGEL PI INDICATOR 7.0 (GLOVE) ×2
GLOVE SURG SS PI 7.5 STRL IVOR (GLOVE) ×2 IMPLANT
GOWN STRL REUS W/TWL LRG LVL3 (GOWN DISPOSABLE) ×4 IMPLANT
IV NS 500ML (IV SOLUTION) ×2
IV NS 500ML BAXH (IV SOLUTION) ×1 IMPLANT
KIT PORT POWER 8FR ISP MRI (Port) ×2 IMPLANT
KIT TURNOVER KIT A (KITS) ×2 IMPLANT
NDL HYPO 25X1 1.5 SAFETY (NEEDLE) ×1 IMPLANT
NEEDLE HYPO 25X1 1.5 SAFETY (NEEDLE) ×2 IMPLANT
PACK MINOR (CUSTOM PROCEDURE TRAY) ×1 IMPLANT
PAD ARMBOARD 7.5X6 YLW CONV (MISCELLANEOUS) ×2 IMPLANT
SET BASIN LINEN APH (SET/KITS/TRAYS/PACK) ×2 IMPLANT
SUT MNCRL AB 4-0 PS2 18 (SUTURE) ×2 IMPLANT
SUT VIC AB 3-0 SH 27 (SUTURE) ×2
SUT VIC AB 3-0 SH 27X BRD (SUTURE) ×1 IMPLANT
SYR 5ML LL (SYRINGE) ×2 IMPLANT
SYR CONTROL 10ML LL (SYRINGE) ×2 IMPLANT

## 2020-05-24 NOTE — Anesthesia Preprocedure Evaluation (Signed)
Anesthesia Evaluation  Patient identified by MRN, date of birth, ID band Patient awake    Reviewed: Allergy & Precautions, NPO status , Patient's Chart, lab work & pertinent test results, reviewed documented beta blocker date and time   History of Anesthesia Complications (+) history of anesthetic complications  Airway Mallampati: II  TM Distance: >3 FB Neck ROM: Full    Dental  (+) Teeth Intact   Pulmonary neg pulmonary ROS, former smoker,    Pulmonary exam normal        Cardiovascular hypertension, Pt. on medications and Pt. on home beta blockers + CAD, + Past MI, + Cardiac Stents, + CABG (2017), + Peripheral Vascular Disease and +CHF  Normal cardiovascular exam     Neuro/Psych negative neurological ROS  negative psych ROS   GI/Hepatic Neg liver ROS, GERD  ,Colon cancer s/p resection Nov 2020   Endo/Other  Hypothyroidism   Renal/GU Renal InsufficiencyRenal disease  negative genitourinary   Musculoskeletal  (+) Arthritis ,   Abdominal   Peds  Hematology  (+) Blood dyscrasia, anemia ,   Anesthesia Other Findings  Breast cancer   Intermediate risk nuclear perfusion study 09/01/2019.  Subsequent cardiac cath 09/12/2019, per results, "Patient has been maintained on long-term dual antiplatelet therapy with aspirin and Plavix.  All grafts are widely patent.  Medical therapy for concomitant native CAD.  Continue aggressive lipid-lowering therapy with target LDL less than 70.  Clearance will be given for the patient's future robotic colectomy but she will need to hold Plavix for at least 5 days prior to the procedure."   EF by cath 50%, EF by stress test 30-44%   Reproductive/Obstetrics                             Anesthesia Physical  Anesthesia Plan  ASA: III  Anesthesia Plan: General   Post-op Pain Management: GA combined w/ Regional for post-op pain   Induction: Intravenous  PONV  Risk Score and Plan: 3 and Ondansetron, Treatment may vary due to age or medical condition and Propofol infusion  Airway Management Planned: LMA  Additional Equipment: None  Intra-op Plan:   Post-operative Plan: Extubation in OR  Informed Consent: I have reviewed the patients History and Physical, chart, labs and discussed the procedure including the risks, benefits and alternatives for the proposed anesthesia with the patient or authorized representative who has indicated his/her understanding and acceptance.     Dental advisory given  Plan Discussed with: CRNA  Anesthesia Plan Comments: (See PAT note 10/10/2019, Konrad Felix, PA-C)        Anesthesia Quick Evaluation

## 2020-05-24 NOTE — Discharge Instructions (Signed)

## 2020-05-24 NOTE — Op Note (Signed)
Patient:  Jessica Taylor  DOB:  1941-01-14  MRN:  [id number]   Preop Diagnosis: Right breast carcinoma  Postop Diagnosis: Same  Procedure: Port-A-Cath insertion  Surgeon: Aviva Signs, MD  Anes: MAC  Indications: Patient is an 79 year old white female who is about to undergo chemotherapy for right breast cancer.  The risks and benefits of the procedure including bleeding, infection, and pneumothorax were fully explained to the patient, who gave informed consent.  Procedure note: The patient was placed in the Trendelenburg position after the left upper chest was prepped and draped using usual sterile technique with ChloraPrep.  Surgical site confirmation was performed.  1% Xylocaine was used for local anesthesia.  An incision was made below the left clavicle.  A subcutaneous pocket was formed.  The needle was advanced into the left subclavian vein using the Seldinger technique without difficulty.  A guidewire was then advanced into the right atrium under fluoroscopic guidance.  An introducer and peel-away sheath were placed over the guidewire.  The catheter was inserted through the peel-away sheath and the peel-away sheath was removed.  The catheter was then attached to the port and the port placed in subcutaneous pocket.  Adequate positioning was confirmed by fluoroscopy.  Good backflow of venous blood was noted on aspiration of the port.  The port was flushed with heparin flush.  Subcutaneous layer was reapproximated using a 3-0 Vicryl interrupted suture.  The skin was closed using a 4-0 Monocryl subcuticular suture.  Dermabond was applied.  All tape and needle counts were correct at the end of the procedure.  Patient was awakened and transferred to PACU in stable condition.  A chest x-ray will be performed at that time.  Complications: None  EBL: Minimal  Specimen: None

## 2020-05-24 NOTE — Interval H&P Note (Signed)
History and Physical Interval Note:  05/24/2020 10:34 AM  Jessica Taylor  has presented today for surgery, with the diagnosis of Infiltrating lobular carcinoma of right breast breast.  The various methods of treatment have been discussed with the patient and family. After consideration of risks, benefits and other options for treatment, the patient has consented to  Procedure(s): INSERTION PORT-A-CATH (Left) as a surgical intervention.  The patient's history has been reviewed, patient examined, no change in status, stable for surgery.  I have reviewed the patient's chart and labs.  Questions were answered to the patient's satisfaction.     Aviva Signs

## 2020-05-24 NOTE — Anesthesia Postprocedure Evaluation (Signed)
Anesthesia Post Note  Patient: Jessica Taylor  Procedure(s) Performed: INSERTION PORT-A-CATH (Left Chest)  Patient location during evaluation: Phase II Anesthesia Type: General Level of consciousness: awake Pain management: pain level controlled Vital Signs Assessment: post-procedure vital signs reviewed and stable Respiratory status: spontaneous breathing Cardiovascular status: blood pressure returned to baseline Postop Assessment: no headache Anesthetic complications: no   No complications documented.   Last Vitals:  Vitals:   05/24/20 1200 05/24/20 1215  BP: (!) 172/64 (!) 153/84  Pulse: 61 (!) 55  Resp: 13 16  Temp:  36.6 C  SpO2: 100% 95%    Last Pain:  Vitals:   05/24/20 1215  TempSrc: Oral  PainSc: 0-No pain                 Louann Sjogren

## 2020-05-24 NOTE — Transfer of Care (Signed)
Immediate Anesthesia Transfer of Care Note  Patient: Jessica Taylor  Procedure(s) Performed: INSERTION PORT-A-CATH (Left Chest)  Patient Location: PACU  Anesthesia Type:MAC  Level of Consciousness: awake, alert  and oriented  Airway & Oxygen Therapy: Patient Spontanous Breathing and Patient connected to face mask oxygen  Post-op Assessment: Report given to RN and Post -op Vital signs reviewed and stable  Post vital signs: Reviewed and stable  Last Vitals:  Vitals Value Taken Time  BP 139/68 05/24/20 1140  Temp    Pulse 58 05/24/20 1140  Resp    SpO2 99 % 05/24/20 1140  Vitals shown include unvalidated device data.  Last Pain:  Vitals:   05/24/20 1032  TempSrc: Oral  PainSc: 0-No pain      Patients Stated Pain Goal: 8 (12/45/80 9983)  Complications: No complications documented.

## 2020-05-28 ENCOUNTER — Encounter (HOSPITAL_COMMUNITY): Payer: Self-pay | Admitting: General Surgery

## 2020-06-04 ENCOUNTER — Inpatient Hospital Stay (HOSPITAL_COMMUNITY): Payer: Medicare Other

## 2020-06-04 ENCOUNTER — Inpatient Hospital Stay (HOSPITAL_BASED_OUTPATIENT_CLINIC_OR_DEPARTMENT_OTHER): Payer: Medicare Other | Admitting: Hematology

## 2020-06-04 ENCOUNTER — Other Ambulatory Visit: Payer: Self-pay

## 2020-06-04 ENCOUNTER — Inpatient Hospital Stay (HOSPITAL_COMMUNITY): Payer: Medicare Other | Attending: Hematology

## 2020-06-04 VITALS — BP 169/65 | HR 58 | Temp 98.6°F | Resp 18

## 2020-06-04 VITALS — BP 170/72 | HR 63 | Temp 98.6°F | Resp 18 | Wt 154.4 lb

## 2020-06-04 DIAGNOSIS — R5383 Other fatigue: Secondary | ICD-10-CM | POA: Insufficient documentation

## 2020-06-04 DIAGNOSIS — Z951 Presence of aortocoronary bypass graft: Secondary | ICD-10-CM | POA: Insufficient documentation

## 2020-06-04 DIAGNOSIS — R197 Diarrhea, unspecified: Secondary | ICD-10-CM | POA: Insufficient documentation

## 2020-06-04 DIAGNOSIS — I252 Old myocardial infarction: Secondary | ICD-10-CM | POA: Insufficient documentation

## 2020-06-04 DIAGNOSIS — C182 Malignant neoplasm of ascending colon: Secondary | ICD-10-CM

## 2020-06-04 DIAGNOSIS — Z87891 Personal history of nicotine dependence: Secondary | ICD-10-CM | POA: Insufficient documentation

## 2020-06-04 DIAGNOSIS — Z7982 Long term (current) use of aspirin: Secondary | ICD-10-CM | POA: Insufficient documentation

## 2020-06-04 DIAGNOSIS — E039 Hypothyroidism, unspecified: Secondary | ICD-10-CM | POA: Insufficient documentation

## 2020-06-04 DIAGNOSIS — I739 Peripheral vascular disease, unspecified: Secondary | ICD-10-CM | POA: Diagnosis not present

## 2020-06-04 DIAGNOSIS — Z5112 Encounter for antineoplastic immunotherapy: Secondary | ICD-10-CM | POA: Insufficient documentation

## 2020-06-04 DIAGNOSIS — Z170 Estrogen receptor positive status [ER+]: Secondary | ICD-10-CM | POA: Diagnosis not present

## 2020-06-04 DIAGNOSIS — Z79811 Long term (current) use of aromatase inhibitors: Secondary | ICD-10-CM | POA: Insufficient documentation

## 2020-06-04 DIAGNOSIS — M549 Dorsalgia, unspecified: Secondary | ICD-10-CM | POA: Diagnosis not present

## 2020-06-04 DIAGNOSIS — Z9221 Personal history of antineoplastic chemotherapy: Secondary | ICD-10-CM | POA: Diagnosis not present

## 2020-06-04 DIAGNOSIS — R319 Hematuria, unspecified: Secondary | ICD-10-CM | POA: Diagnosis not present

## 2020-06-04 DIAGNOSIS — C773 Secondary and unspecified malignant neoplasm of axilla and upper limb lymph nodes: Secondary | ICD-10-CM | POA: Insufficient documentation

## 2020-06-04 DIAGNOSIS — I10 Essential (primary) hypertension: Secondary | ICD-10-CM | POA: Insufficient documentation

## 2020-06-04 DIAGNOSIS — C50911 Malignant neoplasm of unspecified site of right female breast: Secondary | ICD-10-CM

## 2020-06-04 DIAGNOSIS — E7800 Pure hypercholesterolemia, unspecified: Secondary | ICD-10-CM | POA: Insufficient documentation

## 2020-06-04 DIAGNOSIS — N6320 Unspecified lump in the left breast, unspecified quadrant: Secondary | ICD-10-CM | POA: Insufficient documentation

## 2020-06-04 DIAGNOSIS — Z79899 Other long term (current) drug therapy: Secondary | ICD-10-CM | POA: Insufficient documentation

## 2020-06-04 DIAGNOSIS — I2510 Atherosclerotic heart disease of native coronary artery without angina pectoris: Secondary | ICD-10-CM | POA: Diagnosis not present

## 2020-06-04 LAB — COMPREHENSIVE METABOLIC PANEL
ALT: 18 U/L (ref 0–44)
AST: 19 U/L (ref 15–41)
Albumin: 4 g/dL (ref 3.5–5.0)
Alkaline Phosphatase: 93 U/L (ref 38–126)
Anion gap: 9 (ref 5–15)
BUN: 25 mg/dL — ABNORMAL HIGH (ref 8–23)
CO2: 25 mmol/L (ref 22–32)
Calcium: 9.7 mg/dL (ref 8.9–10.3)
Chloride: 106 mmol/L (ref 98–111)
Creatinine, Ser: 1.53 mg/dL — ABNORMAL HIGH (ref 0.44–1.00)
GFR calc Af Amer: 37 mL/min — ABNORMAL LOW (ref >60)
GFR calc non Af Amer: 32 mL/min — ABNORMAL LOW (ref >60)
Glucose, Bld: 100 mg/dL — ABNORMAL HIGH (ref 70–99)
Potassium: 4.1 mmol/L (ref 3.5–5.1)
Sodium: 140 mmol/L (ref 135–145)
Total Bilirubin: 0.8 mg/dL (ref 0.3–1.2)
Total Protein: 6.7 g/dL (ref 6.5–8.1)

## 2020-06-04 LAB — CBC WITH DIFFERENTIAL/PLATELET
Abs Immature Granulocytes: 0.01 10*3/uL (ref 0.00–0.07)
Basophils Absolute: 0.1 10*3/uL (ref 0.0–0.1)
Basophils Relative: 1 %
Eosinophils Absolute: 0 10*3/uL (ref 0.0–0.5)
Eosinophils Relative: 1 %
HCT: 46.6 % — ABNORMAL HIGH (ref 36.0–46.0)
Hemoglobin: 14.4 g/dL (ref 12.0–15.0)
Immature Granulocytes: 0 %
Lymphocytes Relative: 25 %
Lymphs Abs: 1.4 10*3/uL (ref 0.7–4.0)
MCH: 29.3 pg (ref 26.0–34.0)
MCHC: 30.9 g/dL (ref 30.0–36.0)
MCV: 94.7 fL (ref 80.0–100.0)
Monocytes Absolute: 0.4 10*3/uL (ref 0.1–1.0)
Monocytes Relative: 7 %
Neutro Abs: 3.6 10*3/uL (ref 1.7–7.7)
Neutrophils Relative %: 66 %
Platelets: 184 10*3/uL (ref 150–400)
RBC: 4.92 MIL/uL (ref 3.87–5.11)
RDW: 14 % (ref 11.5–15.5)
WBC: 5.5 10*3/uL (ref 4.0–10.5)
nRBC: 0 % (ref 0.0–0.2)

## 2020-06-04 MED ORDER — SODIUM CHLORIDE 0.9 % IV SOLN: Freq: Once | INTRAVENOUS | Status: AC

## 2020-06-04 MED ORDER — HEPARIN SOD (PORK) LOCK FLUSH 100 UNIT/ML IV SOLN
500.0000 [IU] | Freq: Once | INTRAVENOUS | Status: AC | PRN
  Administered 2020-06-04: 500 [IU]

## 2020-06-04 MED ORDER — SODIUM CHLORIDE 0.9% FLUSH
10.0000 mL | INTRAVENOUS | Status: DC | PRN
  Administered 2020-06-04: 10 mL

## 2020-06-04 MED ORDER — SODIUM CHLORIDE 0.9 % IV SOLN
200.0000 mg | Freq: Once | INTRAVENOUS | Status: AC
  Administered 2020-06-04: 200 mg via INTRAVENOUS
  Filled 2020-06-04: qty 8

## 2020-06-04 NOTE — Progress Notes (Signed)
Jessica Taylor, [place] [address]   CLINIC:  Medical Oncology/Hematology  PCP:  Antony Contras, MD [address] / [place] [address] [phone]   REASON FOR VISIT:  Follow-up for stage I breast cancer and colon cancer  PRIOR THERAPY:  1. Robotic proximal colectomy on 10/11/2019 2. Right breast lumpectomy and SLNB on 04/02/2020.  NGS Results: MSI--high, Foundation 1 not sent.  CURRENT THERAPY: Keytruda  BRIEF ONCOLOGIC HISTORY:  Oncology History  Cancer of ascending colon s/p robotic proximal colectomy 10/11/2019  10/11/2019 Initial Diagnosis   Cancer of ascending colon s/p robotic proximal colectomy 10/11/2019   10/23/2019 Cancer Staging   Staging form: Colon and Rectum, AJCC 8th Edition - Clinical stage from 10/23/2019: Stage IVA (cT3, cN1c, cM1a) - Signed by Derek Jack, MD on 12/14/2019   05/14/2020 -  Chemotherapy   The patient had pembrolizumab (KEYTRUDA) 200 mg in sodium chloride 0.9 % 50 mL chemo infusion, 200 mg, Intravenous, Once, 1 of 4 cycles Administration: 200 mg (05/14/2020)  for chemotherapy treatment.    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 [facility])   04/23/2020 Cancer Staging   Staging form: Breast, AJCC 8th Edition - Clinical stage from 04/23/2020: Stage IA (cT1c, cN0(sn), cM0, G2, ER+, PR+, HER2-) - Signed by Derek Jack, MD on 04/23/2020     CANCER STAGING: Cancer Staging Cancer of ascending colon s/p robotic proximal colectomy 10/11/2019 Staging form: Colon and Rectum, AJCC 8th Edition - Clinical stage from 10/23/2019: Stage IVA (cT3, cN1c, cM1a) - Signed by Derek Jack, MD on 12/14/2019  Malignant neoplasm of right female breast [facility]) Staging form: Breast, AJCC 8th Edition - Clinical stage from 04/23/2020: Stage IA (cT1c, cN0(sn), cM0, G2, ER+, PR+, HER2-) - Signed by Derek Jack, MD on 04/23/2020   INTERVAL HISTORY:  Jessica Taylor, a 79 y.o. female, returns for routine follow-up and consideration for next cycle of chemotherapy. Jessica Taylor was last seen on 05/06/2020.  Due for cycle #2 of Keytruda today.   Today she is accompanied by her son. Overall, she tells me she has been feeling pretty well. She is tolerating the chemo well, but she is having diarrhea after every meal for almost a week now. She reports having similar episodes of diarrhea in the past which resolved on their own. She reports that the diarrhea is soft stool and watery. She is taking her anastrozole daily. She reports hematuria, but none today. She had a cystoscopy with Dr. Tresa Endo at [facility] and reportedly cancer was detected.  Overall, she feels ready for next cycle of chemo today.    REVIEW OF SYSTEMS:  Review of Systems  Constitutional: Positive for appetite change (mildly decreased) and fatigue (mild).  Gastrointestinal: Positive for diarrhea (after every meal).  Genitourinary: Positive for hematuria.   Musculoskeletal: Positive for back pain (4/10 mid back pain).  All other systems reviewed and are negative.   PAST MEDICAL/SURGICAL HISTORY:  Past Medical History:  Diagnosis Date  . Abdominal aortic aneurysm ([place])   . Acid reflux   . Anemia   . Arthritis    knees , R shoulder - tx /w injection - 11/2014  . Basal cell carcinoma (BCC) of dorsum of nose 2010   Resolved  . Cancer ([place])    basal cell on nose  . Colon cancer ([place])   . Coronary artery disease   .  Hypercholesteremia   . Hypertension   . Hypothyroidism   . Lt Acute pyelonephritis 09/11/2018  . 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 Peripheral arterial disease (HCC)    hhigh-grade ostial bilateral calcified iliac stenosis with claudication  . Tobacco abuse   . Vertigo    when lays on left side.   Past Surgical History:  Procedure Laterality Date  . BREAST LUMPECTOMY WITH RADIOACTIVE SEED AND SENTINEL LYMPH NODE BIOPSY  Bilateral 04/02/2020   Procedure: BILATERAL BREAST LUMPECTOMY WITH RADIOACTIVE SEED AND RIGHT SENTINEL LYMPH NODE BIOPSY AND RIGHT TARGETED AXILLARY LYMPH NODE BIOPSY;  Surgeon: Erroll Luna, MD;  Location: [place];  Service: General;  Laterality: Bilateral;  . CARDIAC CATHETERIZATION  5 stents  . CARDIAC CATHETERIZATION N/A 01/20/2016   Procedure: Left Heart Cath and Coronary Angiography;  Surgeon: Peter M Martinique, MD;  Location: [place] CV LAB;  Service: Cardiovascular;  Laterality: N/A;  . CATARACT EXTRACTION W/PHACO Left 05/24/2014   Procedure: CATARACT EXTRACTION PHACO AND INTRAOCULAR LENS PLACEMENT (IOC);  Surgeon: Tonny Branch, MD;  Location: [facility];  Service: Ophthalmology;  Laterality: Left;  CDE:  9.30  . CATARACT EXTRACTION W/PHACO Right 06/18/2014   Procedure: CATARACT EXTRACTION PHACO AND INTRAOCULAR LENS PLACEMENT RIGHT EYE CDE=10.84;  Surgeon: Tonny Branch, MD;  Location: [facility];  Service: Ophthalmology;  Laterality: Right;  . CORONARY ARTERY BYPASS GRAFT N/A 01/24/2016   Procedure: CORONARY ARTERY BYPASS GRAFTING (CABG);  Surgeon: Gaye Pollack, MD;  Location: [place];  Service: Open Heart Surgery;  Laterality: N/A;  . CORONARY STENT PLACEMENT    . CORONARY STENT PLACEMENT  03/06/15   CFX DES  . CYSTOSCOPY/URETEROSCOPY/HOLMIUM LASER/STENT PLACEMENT Left 09/12/2018   Procedure: CYSTOSCOPY/URETEROSCOPY/STENT PLACEMENT;  Surgeon: Ceasar Mons, MD;  Location: [facility];  Service: Urology;  Laterality: Left;  . EYE SURGERY    . LEFT HEART CATH AND CORS/GRAFTS ANGIOGRAPHY N/A 09/12/2019   Procedure: LEFT HEART CATH AND CORS/GRAFTS ANGIOGRAPHY;  Surgeon: Troy Sine, MD;  Location: [place] [facility];  Service: Cardiovascular;  Laterality: N/A;  . LEFT HEART CATHETERIZATION WITH CORONARY ANGIOGRAM N/A 03/06/2015   Procedure: LEFT HEART CATHETERIZATION WITH CORONARY ANGIOGRAM;  Surgeon: Lorretta Harp, MD;  Location: [facility] CATH LAB;  Service: Cardiovascular;   Laterality: N/A;  . PARTIAL KNEE ARTHROPLASTY Right 02/04/2015   Procedure: UNICOMPARTMENTAL KNEE;  Surgeon: Dorna Leitz, MD;  Location: [place];  Service: Orthopedics;  Laterality: Right;  . PERIPHERAL VASCULAR CATHETERIZATION Bilateral 05/13/2015   Procedure: Lower Extremity Angiography;  Surgeon: Lorretta Harp, MD;  Location: [place] CV [facility];  Service: Cardiovascular;  Laterality: Bilateral;  . PERIPHERAL VASCULAR CATHETERIZATION N/A 05/13/2015   Procedure: Abdominal Aortogram;  Surgeon: Lorretta Harp, MD;  Location: [place] CV [facility];  Service: Cardiovascular;  Laterality: N/A;  . PERIPHERAL VASCULAR CATHETERIZATION Bilateral 06/27/2015   Procedure: Peripheral Vascular Intervention;  Surgeon: Lorretta Harp, MD;  Location: [place] [facility];  Service: Cardiovascular;  Laterality: Bilateral;  ILIACS  . PERIPHERAL VASCULAR CATHETERIZATION Bilateral 06/27/2015   Procedure: Peripheral Vascular Atherectomy;  Surgeon: Lorretta Harp, MD;  Location: [place] [facility];  Service: Cardiovascular;  Laterality: Bilateral;  . PORTACATH PLACEMENT Left 05/24/2020   Procedure: INSERTION PORT-A-CATH;  Surgeon: Aviva Signs, MD;  Location: [facility];  Service: General;  Laterality: Left;  . TEE WITHOUT CARDIOVERSION N/A 01/24/2016   Procedure: TRANSESOPHAGEAL ECHOCARDIOGRAM (TEE);  Surgeon: Gaye Pollack, MD;  Location: [place];  Service: Open Heart Surgery;  Laterality: N/A;  .  TONSILLECTOMY     age 77  . TUBAL LIGATION      SOCIAL HISTORY:  Social History   Socioeconomic History  . Marital status: Married    Spouse name: [name]  . Number of children: 4  . Years of education: Not on file  . Highest education level: Not on file  Occupational History  . Occupation: retired    Comment: worked as Presenter, broadcasting for [employer]  Tobacco Use  . Smoking status: Former Smoker    Packs/day: 1.50    Years: 42.00    Pack years: 63.00    Types: Cigarettes    Quit date: 05/19/1999    Years since  quitting: 21.0  . Smokeless tobacco: Never Used  Vaping Use  . Vaping Use: Never used  Substance and Sexual Activity  . Alcohol use: No  . Drug use: No  . Sexual activity: Yes    Birth control/protection: Surgical  Other Topics Concern  . Not on file  Social History Narrative  . Not on file   Social Determinants of Health   Financial Resource Strain:   . Difficulty of Paying Living Expenses:   Food Insecurity:   . Worried About Charity fundraiser in the Last Year:   . Arboriculturist in the Last Year:   Transportation Needs:   . Film/video editor (Medical):   Marland Kitchen Lack of Transportation (Non-Medical):   Physical Activity:   . Days of Exercise per Week:   . Minutes of Exercise per Session:   Stress:   . Feeling of Stress :   Social Connections:   . Frequency of Communication with Friends and Family:   . Frequency of Social Gatherings with Friends and Family:   . Attends Religious Services:   . Active Member of Clubs or Organizations:   . Attends Archivist Meetings:   Marland Kitchen Marital Status:   Intimate Partner Violence:   . Fear of Current or Ex-Partner:   . Emotionally Abused:   Marland Kitchen Physically Abused:   . Sexually Abused:     FAMILY HISTORY:  Family History  Problem Relation Age of Onset  . Ovarian cancer Mother 22  . Cancer Father 6       unsure of which kind, "it was in his glands"  . Hypertension Maternal Grandmother   . Stroke Maternal Grandfather   . Hypertension Son   . Brain cancer Sister   . Hypertension Daughter   . Heart attack Neg Hx   . Colon cancer Neg Hx   . Esophageal cancer Neg Hx   . Inflammatory bowel disease Neg Hx   . Liver disease Neg Hx   . Pancreatic cancer Neg Hx   . Rectal cancer Neg Hx   . Stomach cancer Neg Hx     CURRENT MEDICATIONS:  Current Outpatient Medications  Medication Sig Dispense Refill  . anastrozole (ARIMIDEX) 1 MG tablet Take 1 tablet (1 mg total) by mouth daily. 30 tablet 6  . aspirin EC 81 MG tablet  Take 81 mg by mouth every evening.     Marland Kitchen atorvastatin (LIPITOR) 40 MG tablet TAKE (1) TABLET BY MOUTH ONCE DAILY. (Patient taking differently: Take 40 mg by mouth daily. ) 90 tablet 3  . clopidogrel (PLAVIX) 75 MG tablet TAKE (1) TABLET BY MOUTH ONCE DAILY. (Patient taking differently: Take 75 mg by mouth daily. ) 90 tablet 3  . esomeprazole (NEXIUM) 20 MG capsule Take 20 mg by mouth daily.     Marland Kitchen  levothyroxine (SYNTHROID, LEVOTHROID) 50 MCG tablet Take 50 mcg by mouth daily before breakfast.    . metoprolol tartrate (LOPRESSOR) 25 MG tablet Take 1 tablet (25 mg total) by mouth 2 (two) times daily. 180 tablet 3  . nitroGLYCERIN (NITROSTAT) 0.4 MG SL tablet Place 1 tablet (0.4 mg total) under the tongue every 5 (five) minutes as needed for chest pain. 25 tablet prn  . Pembrolizumab (KEYTRUDA IV) Inject 200 mg into the vein every 21 ( twenty-one) days.     . traMADol (ULTRAM) 50 MG tablet Take 50 mg by mouth every 8 (eight) hours as needed for moderate pain.     Marland Kitchen lidocaine-prilocaine (EMLA) cream Apply to affected area once (Patient not taking: Reported on 06/04/2020) 30 g 3  . prochlorperazine (COMPAZINE) 10 MG tablet Take 1 tablet (10 mg total) by mouth every 6 (six) hours as needed (Nausea or vomiting). (Patient not taking: Reported on 06/04/2020) 30 tablet 1   No current facility-administered medications for this visit.    ALLERGIES:  Allergies  Allergen Reactions  . Crab [Shellfish Allergy] Nausea And Vomiting    Throws up violently  . Other Nausea And Vomiting    Shrimp    PHYSICAL EXAM:  Performance status (ECOG): 1 - Symptomatic but completely ambulatory  Vitals:   06/04/20 0949  BP: (!) 170/72  Pulse: 63  Resp: 18  Temp: 98.6 F (37 C)  SpO2: 100%   Wt Readings from Last 3 Encounters:  06/04/20 154 lb 6.4 oz (70 kg)  05/16/20 155 lb (70.3 kg)  05/14/20 154 lb (69.9 kg)   Physical Exam Vitals reviewed.  Constitutional:      Appearance: Normal appearance.    Cardiovascular:     Rate and Rhythm: Normal rate and regular rhythm.     Pulses: Normal pulses.     Heart sounds: Normal heart sounds.  Pulmonary:     Effort: Pulmonary effort is normal.     Breath sounds: Normal breath sounds.  Chest:     Comments: Port on left chest Abdominal:     Palpations: Abdomen is soft. There is no hepatomegaly, splenomegaly or mass.     Tenderness: There is abdominal tenderness (vague RUQ TTP).  Musculoskeletal:     Right lower leg: No edema.     Left lower leg: No edema.  Neurological:     General: No focal deficit present.     Mental Status: She is alert and oriented to person, place, and time.  Psychiatric:        Mood and Affect: Mood normal.        Behavior: Behavior normal.     LABORATORY DATA:  I have reviewed the labs as listed.  CBC Latest Ref Rng & Units 06/04/2020 05/14/2020 04/24/2020  WBC 4.0 - 10.5 K/uL 5.5 5.5 5.2  Hemoglobin 12.0 - 15.0 g/dL 14.4 14.4 14.5  Hematocrit 36 - 46 % 46.6(H) 47.1(H) 46.3(H)  Platelets 150 - 400 K/uL 184 169 174   CMP Latest Ref Rng & Units 05/14/2020 04/24/2020 04/02/2020  Glucose 70 - 99 mg/dL 88 119(H) 92  BUN 8 - 23 mg/dL 26(H) 21 25(H)  Creatinine 0.44 - 1.00 mg/dL 1.49(H) 1.30(H) 1.31(H)  Sodium 135 - 145 mmol/L 141 138 143  Potassium 3.5 - 5.1 mmol/L 4.2 4.2 4.4  Chloride 98 - 111 mmol/L 102 104 105  CO2 22 - 32 mmol/L 29 27 29   Calcium 8.9 - 10.3 mg/dL 9.9 9.5 10.0  Total Protein 6.5 - 8.1  g/dL 6.9 6.7 6.7  Total Bilirubin 0.3 - 1.2 mg/dL 0.7 0.9 0.8  Alkaline Phos 38 - 126 U/L 83 83 95  AST 15 - 41 U/L 19 20 22   ALT 0 - 44 U/L 17 16 20    Lab Results  Component Value Date   CEA1 12.7 (H) 05/14/2020   CEA1 13.4 (H) 04/24/2020   CEA1 10.7 (H) 11/22/2019    DIAGNOSTIC IMAGING:  I have independently reviewed the scans and discussed with the patient. DG Chest Port 1 View  Result Date: 05/24/2020 CLINICAL DATA:  Port placement EXAM: PORTABLE CHEST 1 VIEW COMPARISON:  03/09/2016 FINDINGS: Previous  median sternotomy and CABG. Power port placed from a left subclavian approach with the tip in the SVC at the azygos level. No pneumothorax. Chronic aortic atherosclerotic calcification. Borderline cardiomegaly. Pulmonary vascularity is normal. The lungs are clear. No acute bone finding. IMPRESSION: Power port placed from a left subclavian approach with tip in the SVC at the azygos level. No pneumothorax. Previous median sternotomy and CABG. Borderline cardiomegaly. Aortic atherosclerosis. Electronically Signed   By: Nelson Chimes M.D.   On: 05/24/2020 12:15   DG C-Arm 1-60 Min-No Report  Result Date: 05/24/2020 Fluoroscopy was utilized by the requesting physician.  No radiographic interpretation.     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 Right hemicolectomy on 10/11/2019, grade 3 adenocarcinoma, negative margins, positive LVSI, 2 tumor deposits, 0/15 lymph nodes positive, PT3PN1C. -MMR with loss of nuclear expression. MSI-high. MLH1 hyper methylation present. -PET scan in December 2020 showed lymph node in the right axillary region. Biopsy consistent with metastatic colon cancer. -Last CEA was 10.7 on 11/22/2019. -Right axillary lymph node excision on 04/02/2020 consistent with metastatic carcinoma from colon cancer. -Pembrolizumab started on 05/14/2020.  2.Stage I (PT1CPN0) right Breast, Grade 2 Invasive Lobular Carcinoma: -MRI of the breast on 12/25/2019 showed 1 cm mass behind the right areola with a suspicious mass in the left breast. -Right breast lumpectomy on 04/02/2020 shows invasive lobular carcinoma, grade 2, 1.2 cm. Resection margins are negative. Negative LVSI. 2 sentinel lymph nodes were negative for carcinoma. ER/PR 100% positive, HER-2 negative, Ki-67 10%.   PLAN:  1.Stage IV colon cancer to the right axillary lymph node: -She has tolerated first cycle of  pembrolizumab very well. -Her CEA has improved to 10.8.  I reviewed CBC which is within normal limits.  LFTs are also normal. -She will proceed with her cycle 2 of pembrolizumab.  I will see her back in 3 weeks for follow-up.  2. Right Breast, Grade 2 Invasive Lobular Carcinoma: -She is tolerating anastrozole very well.  3. Normocytic anemia: -Last Feraheme on 11/03/2019. -Hemoglobin today is 14.4.  4.  Bladder cancer: -She reportedly underwent cystoscopy at [facility] and was diagnosed with bladder cancer.  Will obtain records.   Orders placed this encounter:  No orders of the defined types were placed in this encounter.    Derek Jack, MD [place] [phone]   I, Milinda Antis, am acting as a scribe for Dr. Sanda Linger.  I, Derek Jack MD, have reviewed the above documentation for accuracy and completeness, and I agree with the above.

## 2020-06-04 NOTE — Patient Instructions (Signed)
[facility] Discharge Instructions for Patients Receiving Chemotherapy  Today you received the following chemotherapy agents   To help prevent nausea and vomiting after your treatment, we encourage you to take your nausea medication   If you develop nausea and vomiting that is not controlled by your nausea medication, call the clinic.   BELOW ARE SYMPTOMS THAT SHOULD BE REPORTED IMMEDIATELY:  *FEVER GREATER THAN 100.5 F  *CHILLS WITH OR WITHOUT FEVER  NAUSEA AND VOMITING THAT IS NOT CONTROLLED WITH YOUR NAUSEA MEDICATION  *UNUSUAL SHORTNESS OF BREATH  *UNUSUAL BRUISING OR BLEEDING  TENDERNESS IN MOUTH AND THROAT WITH OR WITHOUT PRESENCE OF ULCERS  *URINARY PROBLEMS  *BOWEL PROBLEMS  UNUSUAL RASH Items with * indicate a potential emergency and should be followed up as soon as possible.  Feel free to call the clinic should you have any questions or concerns. The clinic phone number is [phone].  Please show the CHEMO ALERT CARD at check-in to the Emergency Department and triage nurse.   

## 2020-06-04 NOTE — Patient Instructions (Signed)
[place] at [facility] Discharge Instructions  You were seen today by Dr. Delton Coombes. He went over your recent results. You received treatment today. Apply moisturizing lotion on your skin when it is dry. Contact the office immediately if your diarrhea becomes watery and you have 5 or more episodes per day. Dr. Delton Coombes will see you back in 3 weeks for labs and follow up.   Thank you for choosing [place] at [facility] to provide your oncology and hematology care.  To afford each patient quality time with our provider, please arrive at least 15 minutes before your scheduled appointment time.   If you have a lab appointment with the [facility] please come in thru the Main Entrance and check in at the main information desk  You need to re-schedule your appointment should you arrive 10 or more minutes late.  We strive to give you quality time with our providers, and arriving late affects you and other patients whose appointments are after yours.  Also, if you no show three or more times for appointments you may be dismissed from the clinic at the providers discretion.     Again, thank you for choosing [facility].  Our hope is that these requests will decrease the amount of time that you wait before being seen by our physicians.       _____________________________________________________________  Should you have questions after your visit to [facility], please contact our office at [phone] between the hours of 8:00 a.m. and 4:30 p.m.  Voicemails left after 4:00 p.m. will not be returned until the following business day.  For prescription refill requests, have your pharmacy contact our office and allow 72 hours.    Cancer Center Support Programs:   > Cancer Support Group  2nd Tuesday of the month 1pm-2pm, Journey Room

## 2020-06-04 NOTE — Progress Notes (Signed)
Patient presents today for treatment and follow up visit with Dr. Delton Coombes. Labs pending. Blood pressure elevated on arrival. Patient states she did not take her blood pressure medicine this morning.   Message received from Dr. Delton Coombes CMP pending.    Creatinine today 1.53. Message sent to Dr. Delton Coombes. Proceed with treatment order received. Infuse 574mls of Normal Saline over an hour with today's treatment per Verbal Order Dr. Delton Coombes.   Treatment given today per MD orders. Tolerated infusion without adverse affects. Vital signs stable. No complaints at this time. Discharged from clinic ambulatory. F/U with [facility] as scheduled.

## 2020-06-05 LAB — CEA: CEA: 10.8 ng/mL — ABNORMAL HIGH (ref 0.0–4.7)

## 2020-06-25 ENCOUNTER — Other Ambulatory Visit: Payer: Self-pay

## 2020-06-25 ENCOUNTER — Inpatient Hospital Stay (HOSPITAL_COMMUNITY): Payer: Medicare Other | Attending: Hematology

## 2020-06-25 ENCOUNTER — Inpatient Hospital Stay (HOSPITAL_BASED_OUTPATIENT_CLINIC_OR_DEPARTMENT_OTHER): Payer: Medicare Other | Admitting: Hematology

## 2020-06-25 ENCOUNTER — Inpatient Hospital Stay (HOSPITAL_COMMUNITY): Payer: Medicare Other

## 2020-06-25 VITALS — BP 138/83 | HR 70 | Temp 97.3°F | Resp 18 | Wt 154.7 lb

## 2020-06-25 VITALS — BP 152/57 | HR 52 | Temp 97.1°F | Resp 18

## 2020-06-25 DIAGNOSIS — C182 Malignant neoplasm of ascending colon: Secondary | ICD-10-CM

## 2020-06-25 DIAGNOSIS — C50011 Malignant neoplasm of nipple and areola, right female breast: Secondary | ICD-10-CM | POA: Insufficient documentation

## 2020-06-25 DIAGNOSIS — Z79899 Other long term (current) drug therapy: Secondary | ICD-10-CM | POA: Insufficient documentation

## 2020-06-25 DIAGNOSIS — Z170 Estrogen receptor positive status [ER+]: Secondary | ICD-10-CM | POA: Diagnosis not present

## 2020-06-25 DIAGNOSIS — Z87891 Personal history of nicotine dependence: Secondary | ICD-10-CM | POA: Insufficient documentation

## 2020-06-25 DIAGNOSIS — R319 Hematuria, unspecified: Secondary | ICD-10-CM | POA: Insufficient documentation

## 2020-06-25 DIAGNOSIS — M1990 Unspecified osteoarthritis, unspecified site: Secondary | ICD-10-CM | POA: Diagnosis not present

## 2020-06-25 DIAGNOSIS — K219 Gastro-esophageal reflux disease without esophagitis: Secondary | ICD-10-CM | POA: Diagnosis not present

## 2020-06-25 DIAGNOSIS — Z9049 Acquired absence of other specified parts of digestive tract: Secondary | ICD-10-CM | POA: Diagnosis not present

## 2020-06-25 DIAGNOSIS — Z9221 Personal history of antineoplastic chemotherapy: Secondary | ICD-10-CM | POA: Insufficient documentation

## 2020-06-25 DIAGNOSIS — R5383 Other fatigue: Secondary | ICD-10-CM | POA: Insufficient documentation

## 2020-06-25 DIAGNOSIS — C773 Secondary and unspecified malignant neoplasm of axilla and upper limb lymph nodes: Secondary | ICD-10-CM | POA: Diagnosis not present

## 2020-06-25 DIAGNOSIS — R590 Localized enlarged lymph nodes: Secondary | ICD-10-CM | POA: Insufficient documentation

## 2020-06-25 DIAGNOSIS — C679 Malignant neoplasm of bladder, unspecified: Secondary | ICD-10-CM | POA: Diagnosis not present

## 2020-06-25 DIAGNOSIS — E039 Hypothyroidism, unspecified: Secondary | ICD-10-CM | POA: Diagnosis not present

## 2020-06-25 DIAGNOSIS — L299 Pruritus, unspecified: Secondary | ICD-10-CM | POA: Diagnosis not present

## 2020-06-25 DIAGNOSIS — I2510 Atherosclerotic heart disease of native coronary artery without angina pectoris: Secondary | ICD-10-CM | POA: Diagnosis not present

## 2020-06-25 DIAGNOSIS — Z79811 Long term (current) use of aromatase inhibitors: Secondary | ICD-10-CM | POA: Diagnosis not present

## 2020-06-25 DIAGNOSIS — D649 Anemia, unspecified: Secondary | ICD-10-CM | POA: Diagnosis not present

## 2020-06-25 DIAGNOSIS — Z7982 Long term (current) use of aspirin: Secondary | ICD-10-CM | POA: Insufficient documentation

## 2020-06-25 DIAGNOSIS — E7800 Pure hypercholesterolemia, unspecified: Secondary | ICD-10-CM | POA: Diagnosis not present

## 2020-06-25 DIAGNOSIS — R6883 Chills (without fever): Secondary | ICD-10-CM | POA: Insufficient documentation

## 2020-06-25 DIAGNOSIS — R197 Diarrhea, unspecified: Secondary | ICD-10-CM | POA: Diagnosis not present

## 2020-06-25 DIAGNOSIS — I10 Essential (primary) hypertension: Secondary | ICD-10-CM | POA: Insufficient documentation

## 2020-06-25 DIAGNOSIS — C50911 Malignant neoplasm of unspecified site of right female breast: Secondary | ICD-10-CM | POA: Diagnosis not present

## 2020-06-25 DIAGNOSIS — Z5112 Encounter for antineoplastic immunotherapy: Secondary | ICD-10-CM | POA: Diagnosis not present

## 2020-06-25 DIAGNOSIS — Z85828 Personal history of other malignant neoplasm of skin: Secondary | ICD-10-CM | POA: Insufficient documentation

## 2020-06-25 DIAGNOSIS — Z85038 Personal history of other malignant neoplasm of large intestine: Secondary | ICD-10-CM | POA: Insufficient documentation

## 2020-06-25 LAB — CBC WITH DIFFERENTIAL/PLATELET
Abs Immature Granulocytes: 0.02 10*3/uL (ref 0.00–0.07)
Basophils Absolute: 0.1 10*3/uL (ref 0.0–0.1)
Basophils Relative: 1 %
Eosinophils Absolute: 0 10*3/uL (ref 0.0–0.5)
Eosinophils Relative: 0 %
HCT: 48 % — ABNORMAL HIGH (ref 36.0–46.0)
Hemoglobin: 14.9 g/dL (ref 12.0–15.0)
Immature Granulocytes: 0 %
Lymphocytes Relative: 17 %
Lymphs Abs: 1.1 10*3/uL (ref 0.7–4.0)
MCH: 29.3 pg (ref 26.0–34.0)
MCHC: 31 g/dL (ref 30.0–36.0)
MCV: 94.5 fL (ref 80.0–100.0)
Monocytes Absolute: 0.4 10*3/uL (ref 0.1–1.0)
Monocytes Relative: 6 %
Neutro Abs: 4.8 10*3/uL (ref 1.7–7.7)
Neutrophils Relative %: 76 %
Platelets: 177 10*3/uL (ref 150–400)
RBC: 5.08 MIL/uL (ref 3.87–5.11)
RDW: 13.9 % (ref 11.5–15.5)
WBC: 6.4 10*3/uL (ref 4.0–10.5)
nRBC: 0 % (ref 0.0–0.2)

## 2020-06-25 LAB — COMPREHENSIVE METABOLIC PANEL
ALT: 21 U/L (ref 0–44)
AST: 22 U/L (ref 15–41)
Albumin: 4.3 g/dL (ref 3.5–5.0)
Alkaline Phosphatase: 84 U/L (ref 38–126)
Anion gap: 9 (ref 5–15)
BUN: 18 mg/dL (ref 8–23)
CO2: 25 mmol/L (ref 22–32)
Calcium: 9.6 mg/dL (ref 8.9–10.3)
Chloride: 106 mmol/L (ref 98–111)
Creatinine, Ser: 1.45 mg/dL — ABNORMAL HIGH (ref 0.44–1.00)
GFR calc Af Amer: 39 mL/min — ABNORMAL LOW (ref >60)
GFR calc non Af Amer: 34 mL/min — ABNORMAL LOW (ref >60)
Glucose, Bld: 101 mg/dL — ABNORMAL HIGH (ref 70–99)
Potassium: 3.9 mmol/L (ref 3.5–5.1)
Sodium: 140 mmol/L (ref 135–145)
Total Bilirubin: 0.9 mg/dL (ref 0.3–1.2)
Total Protein: 7.1 g/dL (ref 6.5–8.1)

## 2020-06-25 LAB — TSH: TSH: 5.385 u[IU]/mL — ABNORMAL HIGH (ref 0.350–4.500)

## 2020-06-25 MED ORDER — SODIUM CHLORIDE 0.9% FLUSH
10.0000 mL | INTRAVENOUS | Status: DC | PRN
  Administered 2020-06-25: 10 mL

## 2020-06-25 MED ORDER — SODIUM CHLORIDE 0.9 % IV SOLN
200.0000 mg | Freq: Once | INTRAVENOUS | Status: AC
  Administered 2020-06-25: 200 mg via INTRAVENOUS
  Filled 2020-06-25: qty 8

## 2020-06-25 MED ORDER — HEPARIN SOD (PORK) LOCK FLUSH 100 UNIT/ML IV SOLN
500.0000 [IU] | Freq: Once | INTRAVENOUS | Status: AC | PRN
  Administered 2020-06-25: 500 [IU]

## 2020-06-25 MED ORDER — SODIUM CHLORIDE 0.9 % IV SOLN: Freq: Once | INTRAVENOUS | Status: AC

## 2020-06-25 NOTE — Patient Instructions (Signed)
[facility] Discharge Instructions for Patients Receiving Chemotherapy  Today you received the following chemotherapy agents   To help prevent nausea and vomiting after your treatment, we encourage you to take your nausea medication   If you develop nausea and vomiting that is not controlled by your nausea medication, call the clinic.   BELOW ARE SYMPTOMS THAT SHOULD BE REPORTED IMMEDIATELY:  *FEVER GREATER THAN 100.5 F  *CHILLS WITH OR WITHOUT FEVER  NAUSEA AND VOMITING THAT IS NOT CONTROLLED WITH YOUR NAUSEA MEDICATION  *UNUSUAL SHORTNESS OF BREATH  *UNUSUAL BRUISING OR BLEEDING  TENDERNESS IN MOUTH AND THROAT WITH OR WITHOUT PRESENCE OF ULCERS  *URINARY PROBLEMS  *BOWEL PROBLEMS  UNUSUAL RASH Items with * indicate a potential emergency and should be followed up as soon as possible.  Feel free to call the clinic should you have any questions or concerns. The clinic phone number is [phone].  Please show the CHEMO ALERT CARD at check-in to the Emergency Department and triage nurse.   

## 2020-06-25 NOTE — Progress Notes (Signed)
Labs reviewed with Dr Raliegh Ip.  Faythe Ghee to proceed with treatment.   [name] tolerated treatment well today without incidence. Vital signs WNL prior to discharge.  Discharged ambulatory.

## 2020-06-25 NOTE — Progress Notes (Signed)
Jessica Taylor, [place] [address]   CLINIC:  Medical Oncology/Hematology  PCP:  Antony Contras, MD [address] / [place] [address] [phone]   REASON FOR VISIT:  Follow-up for stage I breast cancer and colon cancer  PRIOR THERAPY:  1. Robotic proximal colectomy on 10/11/2019. 2. Right breast lumpectomy and SLNB on 04/02/2020.  NGS Results: MSI--high, Foundation 1 not sent.  CURRENT THERAPY: Keytruda  BRIEF ONCOLOGIC HISTORY:  Oncology History  Cancer of ascending colon s/p robotic proximal colectomy 10/11/2019  10/11/2019 Initial Diagnosis   Cancer of ascending colon s/p robotic proximal colectomy 10/11/2019   10/23/2019 Cancer Staging   Staging form: Colon and Rectum, AJCC 8th Edition - Clinical stage from 10/23/2019: Stage IVA (cT3, cN1c, cM1a) - Signed by Derek Jack, MD on 12/14/2019   05/14/2020 -  Chemotherapy   The patient had pembrolizumab (KEYTRUDA) 200 mg in sodium chloride 0.9 % 50 mL chemo infusion, 200 mg, Intravenous, Once, 2 of 4 cycles Administration: 200 mg (05/14/2020), 200 mg (06/04/2020)  for chemotherapy treatment.    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 [facility])   04/23/2020 Cancer Staging   Staging form: Breast, AJCC 8th Edition - Clinical stage from 04/23/2020: Stage IA (cT1c, cN0(sn), cM0, G2, ER+, PR+, HER2-) - Signed by Derek Jack, MD on 04/23/2020     CANCER STAGING: Cancer Staging Cancer of ascending colon s/p robotic proximal colectomy 10/11/2019 Staging form: Colon and Rectum, AJCC 8th Edition - Clinical stage from 10/23/2019: Stage IVA (cT3, cN1c, cM1a) - Signed by Derek Jack, MD on 12/14/2019  Malignant neoplasm of right female breast [facility]) Staging form: Breast, AJCC 8th Edition - Clinical stage from 04/23/2020: Stage IA (cT1c, cN0(sn), cM0, G2, ER+, PR+, HER2-) -  Signed by Derek Jack, MD on 04/23/2020   INTERVAL HISTORY:  Jessica Taylor, a 79 y.o. female, returns for routine follow-up and consideration for next cycle of chemotherapy. [name] was last seen on 06/04/2020.  Due for cycle #3 of Keytruda today.   Today she is accompanied by her daughter. Overall, she tells me she has been feeling pretty well. She does report having one day of diarrhea x 4 episodes, mainly watery diarrhea, but it did not extend beyond the same day. She is still having hematuria. Her appetite is okay, but not her usual level. She denies having rashes, but reports itching, especially in sweat-prone areas.  Overall, she feels ready for next cycle of chemo today.    REVIEW OF SYSTEMS:  Review of Systems  Constitutional: Positive for appetite change (severely decreased), chills and fatigue (moderate).  Gastrointestinal: Positive for diarrhea.  Genitourinary: Positive for hematuria.   Skin: Positive for itching. Negative for rash.  All other systems reviewed and are negative.   PAST MEDICAL/SURGICAL HISTORY:  Past Medical History:  Diagnosis Date  . Abdominal aortic aneurysm ([place])   . Acid reflux   . Anemia   . Arthritis    knees , R shoulder - tx /w injection - 11/2014  . Basal cell carcinoma (BCC) of dorsum of nose 2010   Resolved  . Cancer ([place])    basal cell on nose  . Colon cancer ([place])   . Coronary artery disease   . Hypercholesteremia   . Hypertension   . Hypothyroidism   . Lt Acute pyelonephritis 09/11/2018  . 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 Peripheral  arterial disease ([place])    hhigh-grade ostial bilateral calcified iliac stenosis with claudication  . Tobacco abuse   . Vertigo    when lays on left side.   Past Surgical History:  Procedure Laterality Date  . BREAST LUMPECTOMY WITH RADIOACTIVE SEED AND SENTINEL LYMPH NODE BIOPSY Bilateral 04/02/2020   Procedure: BILATERAL BREAST LUMPECTOMY WITH RADIOACTIVE SEED AND RIGHT SENTINEL LYMPH NODE BIOPSY AND  RIGHT TARGETED AXILLARY LYMPH NODE BIOPSY;  Surgeon: Erroll Luna, MD;  Location: [place];  Service: General;  Laterality: Bilateral;  . CARDIAC CATHETERIZATION  5 stents  . CARDIAC CATHETERIZATION N/A 01/20/2016   Procedure: Left Heart Cath and Coronary Angiography;  Surgeon: Peter M Martinique, MD;  Location: [place] [facility];  Service: Cardiovascular;  Laterality: N/A;  . CATARACT EXTRACTION W/PHACO Left 05/24/2014   Procedure: CATARACT EXTRACTION PHACO AND INTRAOCULAR LENS PLACEMENT (IOC);  Surgeon: Tonny Branch, MD;  Location: [facility];  Service: Ophthalmology;  Laterality: Left;  CDE:  9.30  . CATARACT EXTRACTION W/PHACO Right 06/18/2014   Procedure: CATARACT EXTRACTION PHACO AND INTRAOCULAR LENS PLACEMENT RIGHT EYE CDE=10.84;  Surgeon: Tonny Branch, MD;  Location: [facility];  Service: Ophthalmology;  Laterality: Right;  . CORONARY ARTERY BYPASS GRAFT N/A 01/24/2016   Procedure: CORONARY ARTERY BYPASS GRAFTING (CABG);  Surgeon: Gaye Pollack, MD;  Location: [place];  Service: Open Heart Surgery;  Laterality: N/A;  . CORONARY STENT PLACEMENT    . CORONARY STENT PLACEMENT  03/06/15   CFX DES  . CYSTOSCOPY/URETEROSCOPY/HOLMIUM LASER/STENT PLACEMENT Left 09/12/2018   Procedure: CYSTOSCOPY/URETEROSCOPY/STENT PLACEMENT;  Surgeon: Ceasar Mons, MD;  Location: [facility];  Service: Urology;  Laterality: Left;  . EYE SURGERY    . LEFT HEART CATH AND CORS/GRAFTS ANGIOGRAPHY N/A 09/12/2019   Procedure: LEFT HEART CATH AND CORS/GRAFTS ANGIOGRAPHY;  Surgeon: Troy Sine, MD;  Location: [place] [facility];  Service: Cardiovascular;  Laterality: N/A;  . LEFT HEART CATHETERIZATION WITH CORONARY ANGIOGRAM N/A 03/06/2015   Procedure: LEFT HEART CATHETERIZATION WITH CORONARY ANGIOGRAM;  Surgeon: Lorretta Harp, MD;  Location: [facility] C[facility];  Service: Cardiovascular;  Laterality: N/A;  . PARTIAL KNEE ARTHROPLASTY Right 02/04/2015   Procedure: UNICOMPARTMENTAL KNEE;  Surgeon: Dorna Leitz, MD;   Location: [place];  Service: Orthopedics;  Laterality: Right;  . PERIPHERAL VASCULAR CATHETERIZATION Bilateral 05/13/2015   Procedure: Lower Extremity Angiography;  Surgeon: Lorretta Harp, MD;  Location: [place] [facility];  Service: Cardiovascular;  Laterality: Bilateral;  . PERIPHERAL VASCULAR CATHETERIZATION N/A 05/13/2015   Procedure: Abdominal Aortogram;  Surgeon: Lorretta Harp, MD;  Location: [place] [facility];  Service: Cardiovascular;  Laterality: N/A;  . PERIPHERAL VASCULAR CATHETERIZATION Bilateral 06/27/2015   Procedure: Peripheral Vascular Intervention;  Surgeon: Lorretta Harp, MD;  Location: [place] [facility];  Service: Cardiovascular;  Laterality: Bilateral;  ILIACS  . PERIPHERAL VASCULAR CATHETERIZATION Bilateral 06/27/2015   Procedure: Peripheral Vascular Atherectomy;  Surgeon: Lorretta Harp, MD;  Location: [place] [facility];  Service: Cardiovascular;  Laterality: Bilateral;  . PORTACATH PLACEMENT Left 05/24/2020   Procedure: INSERTION PORT-A-CATH;  Surgeon: Aviva Signs, MD;  Location: [facility];  Service: General;  Laterality: Left;  . TEE WITHOUT CARDIOVERSION N/A 01/24/2016   Procedure: TRANSESOPHAGEAL ECHOCARDIOGRAM (TEE);  Surgeon: Gaye Pollack, MD;  Location: [place];  Service: Open Heart Surgery;  Laterality: N/A;  . TONSILLECTOMY     age 21  . TUBAL LIGATION      SOCIAL HISTORY:  Social History   Socioeconomic  History  . Marital status: Married    Spouse name: [name]  . Number of children: 4  . Years of education: Not on file  . Highest education level: Not on file  Occupational History  . Occupation: retired    Comment: worked as Presenter, broadcasting for [employer]  Tobacco Use  . Smoking status: Former Smoker    Packs/day: 1.50    Years: 42.00    Pack years: 63.00    Types: Cigarettes    Quit date: 05/19/1999    Years since quitting: 21.1  . Smokeless tobacco: Never Used  Vaping Use  . Vaping Use: Never used  Substance and Sexual Activity  .  Alcohol use: No  . Drug use: No  . Sexual activity: Yes    Birth control/protection: Surgical  Other Topics Concern  . Not on file  Social History Narrative  . Not on file   Social Determinants of Health   Financial Resource Strain:   . Difficulty of Paying Living Expenses:   Food Insecurity:   . Worried About Charity fundraiser in the Last Year:   . Arboriculturist in the Last Year:   Transportation Needs:   . Film/video editor (Medical):   Marland Kitchen Lack of Transportation (Non-Medical):   Physical Activity:   . Days of Exercise per Week:   . Minutes of Exercise per Session:   Stress:   . Feeling of Stress :   Social Connections:   . Frequency of Communication with Friends and Family:   . Frequency of Social Gatherings with Friends and Family:   . Attends Religious Services:   . Active Member of Clubs or Organizations:   . Attends Archivist Meetings:   Marland Kitchen Marital Status:   Intimate Partner Violence:   . Fear of Current or Ex-Partner:   . Emotionally Abused:   Marland Kitchen Physically Abused:   . Sexually Abused:     FAMILY HISTORY:  Family History  Problem Relation Age of Onset  . Ovarian cancer Mother 22  . Cancer Father 77       unsure of which kind, "it was in his glands"  . Hypertension Maternal Grandmother   . Stroke Maternal Grandfather   . Hypertension Son   . Brain cancer Sister   . Hypertension Daughter   . Heart attack Neg Hx   . Colon cancer Neg Hx   . Esophageal cancer Neg Hx   . Inflammatory bowel disease Neg Hx   . Liver disease Neg Hx   . Pancreatic cancer Neg Hx   . Rectal cancer Neg Hx   . Stomach cancer Neg Hx     CURRENT MEDICATIONS:  Current Outpatient Medications  Medication Sig Dispense Refill  . anastrozole (ARIMIDEX) 1 MG tablet Take 1 tablet (1 mg total) by mouth daily. 30 tablet 6  . aspirin EC 81 MG tablet Take 81 mg by mouth every evening.     Marland Kitchen atorvastatin (LIPITOR) 40 MG tablet TAKE (1) TABLET BY MOUTH ONCE DAILY. (Patient  taking differently: Take 40 mg by mouth daily. ) 90 tablet 3  . clopidogrel (PLAVIX) 75 MG tablet TAKE (1) TABLET BY MOUTH ONCE DAILY. (Patient taking differently: Take 75 mg by mouth daily. ) 90 tablet 3  . esomeprazole (NEXIUM) 20 MG capsule Take 20 mg by mouth daily.     Marland Kitchen levothyroxine (SYNTHROID, LEVOTHROID) 50 MCG tablet Take 50 mcg by mouth daily before breakfast.    . metoprolol tartrate (  LOPRESSOR) 25 MG tablet Take 1 tablet (25 mg total) by mouth 2 (two) times daily. 180 tablet 3  . nitroGLYCERIN (NITROSTAT) 0.4 MG SL tablet Place 1 tablet (0.4 mg total) under the tongue every 5 (five) minutes as needed for chest pain. 25 tablet prn  . Pembrolizumab (KEYTRUDA IV) Inject 200 mg into the vein every 21 ( twenty-one) days.     Marland Kitchen lidocaine-prilocaine (EMLA) cream Apply to affected area once (Patient not taking: Reported on 06/25/2020) 30 g 3  . prochlorperazine (COMPAZINE) 10 MG tablet Take 1 tablet (10 mg total) by mouth every 6 (six) hours as needed (Nausea or vomiting). (Patient not taking: Reported on 06/25/2020) 30 tablet 1  . traMADol (ULTRAM) 50 MG tablet Take 50 mg by mouth every 8 (eight) hours as needed for moderate pain.  (Patient not taking: Reported on 06/25/2020)     No current facility-administered medications for this visit.    ALLERGIES:  Allergies  Allergen Reactions  . Crab [Shellfish Allergy] Nausea And Vomiting    Throws up violently  . Other Nausea And Vomiting    Shrimp    PHYSICAL EXAM:  Performance status (ECOG): 1 - Symptomatic but completely ambulatory  Vitals:   06/25/20 1235  BP: 138/83  Pulse: 70  Resp: 18  Temp: (!) 97.3 F (36.3 C)  SpO2: 98%   Wt Readings from Last 3 Encounters:  06/25/20 154 lb 11.2 oz (70.2 kg)  06/04/20 154 lb 6.4 oz (70 kg)  05/16/20 155 lb (70.3 kg)   Physical Exam Vitals reviewed.  Constitutional:      Appearance: Normal appearance.  Cardiovascular:     Rate and Rhythm: Normal rate and regular rhythm.     Pulses:  Normal pulses.     Heart sounds: Normal heart sounds.  Pulmonary:     Effort: Pulmonary effort is normal.     Breath sounds: Normal breath sounds.  Chest:     Comments: Port-a-Cath on L chest Abdominal:     Palpations: Abdomen is soft. There is no mass.     Tenderness: There is no abdominal tenderness.  Musculoskeletal:     Right lower leg: No edema.     Left lower leg: No edema.  Lymphadenopathy:     Cervical: No cervical adenopathy.     Upper Body:     Right upper body: No supraclavicular or axillary adenopathy.     Left upper body: Axillary adenopathy present. No supraclavicular adenopathy.     Lower Body: No right inguinal adenopathy. No left inguinal adenopathy.  Neurological:     General: No focal deficit present.     Mental Status: She is alert and oriented to person, place, and time.  Psychiatric:        Mood and Affect: Mood normal.        Behavior: Behavior normal.     LABORATORY DATA:  I have reviewed the labs as listed.  CBC Latest Ref Rng & Units 06/04/2020 05/14/2020 04/24/2020  WBC 4.0 - 10.5 K/uL 5.5 5.5 5.2  Hemoglobin 12.0 - 15.0 g/dL 14.4 14.4 14.5  Hematocrit 36 - 46 % 46.6(H) 47.1(H) 46.3(H)  Platelets 150 - 400 K/uL 184 169 174   CMP Latest Ref Rng & Units 06/04/2020 05/14/2020 04/24/2020  Glucose 70 - 99 mg/dL 100(H) 88 119(H)  BUN 8 - 23 mg/dL 25(H) 26(H) 21  Creatinine 0.44 - 1.00 mg/dL 1.53(H) 1.49(H) 1.30(H)  Sodium 135 - 145 mmol/L 140 141 138  Potassium 3.5 - 5.1  mmol/L 4.1 4.2 4.2  Chloride 98 - 111 mmol/L 106 102 104  CO2 22 - 32 mmol/L _0 Calcium 8.9 - 10.3 mg/dL 9.7 9.9 9.5  Total Protein 6.5 - 8.1 g/dL 6.7 6.9 6.7  Total Bilirubin 0.3 - 1.2 mg/dL 0.8 0.7 0.9  Alkaline Phos 38 - 126 U/L 93 83 83  AST 15 - 41 U/L _1 ALT 0 - 44 U/L _2 Lab Results  Component Value Date   CEA1 10.8 (H) 06/04/2020   CEA1 12.7 (H) 05/14/2020   CEA1 13.4 (H) 04/24/2020    DIAGNOSTIC IMAGING:  I have independently reviewed the scans and  discussed with the patient. No results found.   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 Right hemicolectomy on 10/11/2019, grade 3 adenocarcinoma, negative margins, positive LVSI, 2 tumor deposits, 0/15 lymph nodes positive, PT3PN1C. -MMR with loss of nuclear expression. MSI-high. MLH1 hyper methylation present. -PET scan in December 2020 showed lymph node in the right axillary region. Biopsy consistent with metastatic colon cancer. -Last CEA was 10.7 on 11/22/2019. -Right axillary lymph node excision on 04/02/2020 consistent with metastatic carcinoma from colon cancer. -Pembrolizumab started on 05/14/2020.  2.Stage I (PT1CPN0) right Breast, Grade 2 Invasive Lobular Carcinoma: -MRI of the breast on 12/25/2019 showed 1 cm mass behind the right areola with a suspicious mass in the left breast. -Right breast lumpectomy on 04/02/2020 shows invasive lobular carcinoma, grade 2, 1.2 cm. Resection margins are negative. Negative LVSI. 2 sentinel lymph nodes were negative for carcinoma. ER/PR 100% positive, HER-2 negative, Ki-67 10%.   PLAN:  1.Stage IV colon cancer to the right axillary lymph node: -She has tolerated 2 cycles of pembrolizumab very well.  She had one episode of diarrhea yesterday. -CEA improved to 10.8 on 06/04/2020.  Left axillary lymphadenopathy has improved in size. -I reviewed her labs from today which showed normal LFTs.  Creatinine is 1.45 and stable. -She will proceed with cycle 3 today.  We will reevaluate her in 3 weeks for follow-up. -She was told to call us immediately if she develops any diarrhea with more than 4-5 watery bowel movements per day. -Plan to repeat scans after cycle 4.  2. Right Breast, Grade 2 Invasive Lobular Carcinoma: -Continue anastrozole.  3. Normocytic anemia: -Last Feraheme on 01/03/2019.  Hemoglobin today is 14.9.  4.   Bladder cancer: -Cystoscopy was done at [facility] for hematuria.   Orders placed this encounter:  No orders of the defined types were placed in this encounter.    Derek Jack, MD [place] [phone]   I, Milinda Antis, am acting as a scribe for Dr. Sanda Linger.  I, Derek Jack MD, have reviewed the above documentation for accuracy and completeness, and I agree with the above.

## 2020-06-25 NOTE — Patient Instructions (Signed)
[place] at [facility] Discharge Instructions  You were seen today by Dr. Delton Coombes. He went over your recent results. You received your treatment today. Keep your skin moisturized while you are going through chemotherapy. Dr. Delton Coombes will see you back in 3 weeks for labs and follow up.   Thank you for choosing [place] at [facility] to provide your oncology and hematology care.  To afford each patient quality time with our provider, please arrive at least 15 minutes before your scheduled appointment time.   If you have a lab appointment with the [facility] please come in thru the Main Entrance and check in at the main information desk  You need to re-schedule your appointment should you arrive 10 or more minutes late.  We strive to give you quality time with our providers, and arriving late affects you and other patients whose appointments are after yours.  Also, if you no show three or more times for appointments you may be dismissed from the clinic at the providers discretion.     Again, thank you for choosing [facility].  Our hope is that these requests will decrease the amount of time that you wait before being seen by our physicians.       _____________________________________________________________  Should you have questions after your visit to [facility], please contact our office at [phone] between the hours of 8:00 a.m. and 4:30 p.m.  Voicemails left after 4:00 p.m. will not be returned until the following business day.  For prescription refill requests, have your pharmacy contact our office and allow 72 hours.    Cancer Center Support Programs:   > Cancer Support Group  2nd Tuesday of the month 1pm-2pm, Journey Room

## 2020-07-16 ENCOUNTER — Inpatient Hospital Stay (HOSPITAL_COMMUNITY): Payer: Medicare Other | Admitting: Hematology

## 2020-07-16 ENCOUNTER — Inpatient Hospital Stay (HOSPITAL_COMMUNITY): Payer: Medicare Other

## 2020-07-16 ENCOUNTER — Other Ambulatory Visit: Payer: Self-pay

## 2020-07-16 VITALS — BP 168/83 | HR 61 | Temp 96.9°F | Resp 18 | Wt 157.4 lb

## 2020-07-16 VITALS — BP 160/66 | HR 53 | Temp 97.2°F | Resp 18

## 2020-07-16 DIAGNOSIS — C182 Malignant neoplasm of ascending colon: Secondary | ICD-10-CM

## 2020-07-16 DIAGNOSIS — C50911 Malignant neoplasm of unspecified site of right female breast: Secondary | ICD-10-CM

## 2020-07-16 LAB — CBC WITH DIFFERENTIAL/PLATELET
Abs Immature Granulocytes: 0.03 10*3/uL (ref 0.00–0.07)
Basophils Absolute: 0.1 10*3/uL (ref 0.0–0.1)
Basophils Relative: 1 %
Eosinophils Absolute: 0.3 10*3/uL (ref 0.0–0.5)
Eosinophils Relative: 5 %
HCT: 45.9 % (ref 36.0–46.0)
Hemoglobin: 14.3 g/dL (ref 12.0–15.0)
Immature Granulocytes: 1 %
Lymphocytes Relative: 19 %
Lymphs Abs: 1.2 10*3/uL (ref 0.7–4.0)
MCH: 29.4 pg (ref 26.0–34.0)
MCHC: 31.2 g/dL (ref 30.0–36.0)
MCV: 94.4 fL (ref 80.0–100.0)
Monocytes Absolute: 0.4 10*3/uL (ref 0.1–1.0)
Monocytes Relative: 7 %
Neutro Abs: 4.1 10*3/uL (ref 1.7–7.7)
Neutrophils Relative %: 67 %
Platelets: 162 10*3/uL (ref 150–400)
RBC: 4.86 MIL/uL (ref 3.87–5.11)
RDW: 13.7 % (ref 11.5–15.5)
WBC: 6.1 10*3/uL (ref 4.0–10.5)
nRBC: 0 % (ref 0.0–0.2)

## 2020-07-16 LAB — COMPREHENSIVE METABOLIC PANEL
ALT: 15 U/L (ref 0–44)
AST: 18 U/L (ref 15–41)
Albumin: 4.1 g/dL (ref 3.5–5.0)
Alkaline Phosphatase: 74 U/L (ref 38–126)
Anion gap: 11 (ref 5–15)
BUN: 20 mg/dL (ref 8–23)
CO2: 25 mmol/L (ref 22–32)
Calcium: 9.6 mg/dL (ref 8.9–10.3)
Chloride: 104 mmol/L (ref 98–111)
Creatinine, Ser: 1.28 mg/dL — ABNORMAL HIGH (ref 0.44–1.00)
GFR calc Af Amer: 45 mL/min — ABNORMAL LOW (ref >60)
GFR calc non Af Amer: 39 mL/min — ABNORMAL LOW (ref >60)
Glucose, Bld: 110 mg/dL — ABNORMAL HIGH (ref 70–99)
Potassium: 4 mmol/L (ref 3.5–5.1)
Sodium: 140 mmol/L (ref 135–145)
Total Bilirubin: 0.9 mg/dL (ref 0.3–1.2)
Total Protein: 6.9 g/dL (ref 6.5–8.1)

## 2020-07-16 LAB — MAGNESIUM: Magnesium: 1.9 mg/dL (ref 1.7–2.4)

## 2020-07-16 MED ORDER — SODIUM CHLORIDE 0.9% FLUSH: 10.0000 mL | INTRAVENOUS | Status: DC | PRN

## 2020-07-16 MED ORDER — SODIUM CHLORIDE 0.9 % IV SOLN: Freq: Once | INTRAVENOUS | Status: AC

## 2020-07-16 MED ORDER — SODIUM CHLORIDE 0.9 % IV SOLN
200.0000 mg | Freq: Once | INTRAVENOUS | Status: AC
  Administered 2020-07-16: 200 mg via INTRAVENOUS
  Filled 2020-07-16: qty 8

## 2020-07-16 MED ORDER — HEPARIN SOD (PORK) LOCK FLUSH 100 UNIT/ML IV SOLN
500.0000 [IU] | Freq: Once | INTRAVENOUS | Status: AC | PRN
  Administered 2020-07-16: 500 [IU]

## 2020-07-16 NOTE — Progress Notes (Signed)
Labs reviewed today with MD. Will proceed as planned per MD.

## 2020-07-16 NOTE — Progress Notes (Signed)
Dr. Delton Coombes has assessed patient and reviewed labs.  Patient is okay to proceed with treatment today. Primary RN and pharmacy aware.

## 2020-07-16 NOTE — Progress Notes (Signed)
Jessica Taylor, [place] [id number]   CLINIC:  Medical Oncology/Hematology  PCP:  Antony Contras, MD [address] / [place] [address] [phone]   REASON FOR VISIT:  Follow-up for stage I breast cancer and colon cancer  PRIOR THERAPY:  1. Robotic proximal colectomy on 10/11/2019. 2. Right breast lumpectomy and SLNB on 04/02/2020.  NGS Results: MSI--high, Foundation 1 not sent.  CURRENT THERAPY: Pembrolizumab every 3 weeks  BRIEF ONCOLOGIC HISTORY:  Oncology History  Cancer of ascending colon s/p robotic proximal colectomy 10/11/2019  10/11/2019 Initial Diagnosis   Cancer of ascending colon s/p robotic proximal colectomy 10/11/2019   10/23/2019 Cancer Staging   Staging form: Colon and Rectum, AJCC 8th Edition - Clinical stage from 10/23/2019: Stage IVA (cT3, cN1c, cM1a) - Signed by Derek Jack, MD on 12/14/2019   05/14/2020 -  Chemotherapy   The patient had pembrolizumab (KEYTRUDA) 200 mg in sodium chloride 0.9 % 50 mL chemo infusion, 200 mg, Intravenous, Once, 3 of 6 cycles Administration: 200 mg (05/14/2020), 200 mg (06/25/2020), 200 mg (06/04/2020)  for chemotherapy treatment.    Malignant neoplasm of right female breast ([place])  04/23/2020 Initial Diagnosis   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)   04/23/2020 Cancer Staging   Staging form: Breast, AJCC 8th Edition - Clinical stage from 04/23/2020: Stage IA (cT1c, cN0(sn), cM0, G2, ER+, PR+, HER2-) - Signed by Derek Jack, MD on 04/23/2020     CANCER STAGING: Cancer Staging Cancer of ascending colon s/p robotic proximal colectomy 10/11/2019 Staging form: Colon and Rectum, AJCC 8th Edition - Clinical stage from 10/23/2019: Stage IVA (cT3, cN1c, cM1a) - Signed by Derek Jack, MD on 12/14/2019  Malignant neoplasm of right female breast [facility]) Staging form: Breast, AJCC 8th Edition - Clinical stage from 04/23/2020: Stage IA (cT1c,  cN0(sn), cM0, G2, ER+, PR+, HER2-) - Signed by Derek Jack, MD on 04/23/2020   INTERVAL HISTORY:  [name], a 79 y.o. female, returns for routine follow-up and consideration for next cycle of chemotherapy. [name] was last seen on 06/25/2020.  Due for cycle #4 of pembrolizumab today.   Today she is accompanied by her daughter. Overall, she tells me she has been feeling pretty well. She continues having loose stools approximately 3-4 times per week. She denies having N/V or rash. She denies dyspnea and her appetite is good.  Overall, she feels ready for next cycle of chemo today.    REVIEW OF SYSTEMS:  Review of Systems  Constitutional: Positive for fatigue (mild). Negative for appetite change.  Respiratory: Negative for shortness of breath.   Gastrointestinal: Positive for constipation and diarrhea. Negative for nausea and vomiting.  Genitourinary: Positive for hematuria.   Skin: Negative for rash.  All other systems reviewed and are negative.   PAST MEDICAL/SURGICAL HISTORY:  Past Medical History:  Diagnosis Date  . Abdominal aortic aneurysm ([place])   . Acid reflux   . Anemia   . Arthritis    knees , R shoulder - tx /w injection - 11/2014  . Basal cell carcinoma (BCC) of dorsum of nose 2010   Resolved  . Cancer ([place])    basal cell on nose  . Colon cancer ([place])   . Coronary artery disease   . Hypercholesteremia   . Hypertension   . Hypothyroidism   . Lt Acute pyelonephritis 09/11/2018  . MI, old 21  . Peripheral arterial disease ([facility])    hhigh-grade ostial  bilateral calcified iliac stenosis with claudication  . Tobacco abuse   . Vertigo    when lays on left side.   Past Surgical History:  Procedure Laterality Date  . BREAST LUMPECTOMY WITH RADIOACTIVE SEED AND SENTINEL LYMPH NODE BIOPSY Bilateral 04/02/2020   Procedure: BILATERAL BREAST LUMPECTOMY WITH RADIOACTIVE SEED AND RIGHT SENTINEL LYMPH NODE BIOPSY AND RIGHT TARGETED AXILLARY LYMPH NODE BIOPSY;   Surgeon: Erroll Luna, MD;  Location: [facility];  Service: General;  Laterality: Bilateral;  . CARDIAC CATHETERIZATION  5 stents  . CARDIAC CATHETERIZATION N/A 01/20/2016   Procedure: Left Heart Cath and Coronary Angiography;  Surgeon: Peter M Martinique, MD;  Location: [place] [facility];  Service: Cardiovascular;  Laterality: N/A;  . CATARACT EXTRACTION W/PHACO Left 05/24/2014   Procedure: CATARACT EXTRACTION PHACO AND INTRAOCULAR LENS PLACEMENT (IOC);  Surgeon: Tonny Branch, MD;  Location: [facility];  Service: Ophthalmology;  Laterality: Left;  CDE:  9.30  . CATARACT EXTRACTION W/PHACO Right 06/18/2014   Procedure: CATARACT EXTRACTION PHACO AND INTRAOCULAR LENS PLACEMENT RIGHT EYE CDE=10.84;  Surgeon: Tonny Branch, MD;  Location: [facility];  Service: Ophthalmology;  Laterality: Right;  . CORONARY ARTERY BYPASS GRAFT N/A 01/24/2016   Procedure: CORONARY ARTERY BYPASS GRAFTING (CABG);  Surgeon: Gaye Pollack, MD;  Location: [facility];  Service: Open Heart Surgery;  Laterality: N/A;  . CORONARY STENT PLACEMENT    . CORONARY STENT PLACEMENT  03/06/15   CFX DES  . CYSTOSCOPY/URETEROSCOPY/HOLMIUM LASER/STENT PLACEMENT Left 09/12/2018   Procedure: CYSTOSCOPY/URETEROSCOPY/STENT PLACEMENT;  Surgeon: Ceasar Mons, MD;  Location: [facility];  Service: Urology;  Laterality: Left;  . EYE SURGERY    . LEFT HEART CATH AND CORS/GRAFTS ANGIOGRAPHY N/A 09/12/2019   Procedure: LEFT HEART CATH AND CORS/GRAFTS ANGIOGRAPHY;  Surgeon: Troy Sine, MD;  Location: [place] [facility];  Service: Cardiovascular;  Laterality: N/A;  . LEFT HEART CATHETERIZATION WITH CORONARY ANGIOGRAM N/A 03/06/2015   Procedure: LEFT HEART CATHETERIZATION WITH CORONARY ANGIOGRAM;  Surgeon: Lorretta Harp, MD;  Location: [facility] CATH LAB;  Service: Cardiovascular;  Laterality: N/A;  . PARTIAL KNEE ARTHROPLASTY Right 02/04/2015   Procedure: UNICOMPARTMENTAL KNEE;  Surgeon: Dorna Leitz, MD;  Location: [place] [facility];  Service: Orthopedics;   Laterality: Right;  . PERIPHERAL VASCULAR CATHETERIZATION Bilateral 05/13/2015   Procedure: Lower Extremity Angiography;  Surgeon: Lorretta Harp, MD;  Location: [place] [facility];  Service: Cardiovascular;  Laterality: Bilateral;  . PERIPHERAL VASCULAR CATHETERIZATION N/A 05/13/2015   Procedure: Abdominal Aortogram;  Surgeon: Lorretta Harp, MD;  Location: [place] CV LAB;  Service: Cardiovascular;  Laterality: N/A;  . PERIPHERAL VASCULAR CATHETERIZATION Bilateral 06/27/2015   Procedure: Peripheral Vascular Intervention;  Surgeon: Lorretta Harp, MD;  Location: [place] [facility];  Service: Cardiovascular;  Laterality: Bilateral;  ILIACS  . PERIPHERAL VASCULAR CATHETERIZATION Bilateral 06/27/2015   Procedure: Peripheral Vascular Atherectomy;  Surgeon: Lorretta Harp, MD;  Location: [place] [facility];  Service: Cardiovascular;  Laterality: Bilateral;  . PORTACATH PLACEMENT Left 05/24/2020   Procedure: INSERTION PORT-A-CATH;  Surgeon: Aviva Signs, MD;  [facility];  Service: General;  Laterality: Left;  . TEE WITHOUT CARDIOVERSION N/A 01/24/2016   Procedure: TRANSESOPHAGEAL ECHOCARDIOGRAM (TEE);  Surgeon: Gaye Pollack, MD;  Location: [facility];  Service: Open Heart Surgery;  Laterality: N/A;  . TONSILLECTOMY     age 35  . TUBAL LIGATION      SOCIAL HISTORY:  Social History   Socioeconomic History  . Marital status: Married  Spouse name: [name]  . Number of children: 4  . Years of education: Not on file  . Highest education level: Not on file  Occupational History  . Occupation: retired    Comment: worked as Presenter, broadcasting for [employer]  Tobacco Use  . Smoking status: Former Smoker    Packs/day: 1.50    Years: 42.00    Pack years: 63.00    Types: Cigarettes    Quit date: 05/19/1999    Years since quitting: 21.1  . Smokeless tobacco: Never Used  Vaping Use  . Vaping Use: Never used  Substance and Sexual Activity  . Alcohol use: No  . Drug use: No  . Sexual  activity: Yes    Birth control/protection: Surgical  Other Topics Concern  . Not on file  Social History Narrative  . Not on file   Social Determinants of Health   Financial Resource Strain:   . Difficulty of Paying Living Expenses: Not on file  Food Insecurity:   . Worried About Charity fundraiser in the Last Year: Not on file  . Ran Out of Food in the Last Year: Not on file  Transportation Needs:   . Lack of Transportation (Medical): Not on file  . Lack of Transportation (Non-Medical): Not on file  Physical Activity:   . Days of Exercise per Week: Not on file  . Minutes of Exercise per Session: Not on file  Stress:   . Feeling of Stress : Not on file  Social Connections:   . Frequency of Communication with Friends and Family: Not on file  . Frequency of Social Gatherings with Friends and Family: Not on file  . Attends Religious Services: Not on file  . Active Member of Clubs or Organizations: Not on file  . Attends Archivist Meetings: Not on file  . Marital Status: Not on file  Intimate Partner Violence:   . Fear of Current or Ex-Partner: Not on file  . Emotionally Abused: Not on file  . Physically Abused: Not on file  . Sexually Abused: Not on file    FAMILY HISTORY:  Family History  Problem Relation Age of Onset  . Ovarian cancer Mother 51  . Cancer Father 45       unsure of which kind, "it was in his glands"  . Hypertension Maternal Grandmother   . Stroke Maternal Grandfather   . Hypertension Son   . Brain cancer Sister   . Hypertension Daughter   . Heart attack Neg Hx   . Colon cancer Neg Hx   . Esophageal cancer Neg Hx   . Inflammatory bowel disease Neg Hx   . Liver disease Neg Hx   . Pancreatic cancer Neg Hx   . Rectal cancer Neg Hx   . Stomach cancer Neg Hx     CURRENT MEDICATIONS:  Current Outpatient Medications  Medication Sig Dispense Refill  . anastrozole (ARIMIDEX) 1 MG tablet Take 1 tablet (1 mg total) by mouth daily. 30 tablet 6    . aspirin EC 81 MG tablet Take 81 mg by mouth every evening.     Marland Kitchen atorvastatin (LIPITOR) 40 MG tablet TAKE (1) TABLET BY MOUTH ONCE DAILY. (Patient taking differently: Take 40 mg by mouth daily. ) 90 tablet 3  . clopidogrel (PLAVIX) 75 MG tablet TAKE (1) TABLET BY MOUTH ONCE DAILY. (Patient taking differently: Take 75 mg by mouth daily. ) 90 tablet 3  . esomeprazole (NEXIUM) 20 MG capsule Take 20 mg  by mouth daily.     Marland Kitchen levothyroxine (SYNTHROID, LEVOTHROID) 50 MCG tablet Take 50 mcg by mouth daily before breakfast.    . metoprolol tartrate (LOPRESSOR) 25 MG tablet Take 1 tablet (25 mg total) by mouth 2 (two) times daily. 180 tablet 3  . Pembrolizumab (KEYTRUDA IV) Inject 200 mg into the vein every 21 ( twenty-one) days.     Marland Kitchen lidocaine-prilocaine (EMLA) cream Apply to affected area once (Patient not taking: Reported on 07/16/2020) 30 g 3  . nitroGLYCERIN (NITROSTAT) 0.4 MG SL tablet Place 1 tablet (0.4 mg total) under the tongue every 5 (five) minutes as needed for chest pain. (Patient not taking: Reported on 07/16/2020) 25 tablet prn  . prochlorperazine (COMPAZINE) 10 MG tablet Take 1 tablet (10 mg total) by mouth every 6 (six) hours as needed (Nausea or vomiting). (Patient not taking: Reported on 07/16/2020) 30 tablet 1  . traMADol (ULTRAM) 50 MG tablet Take 50 mg by mouth every 8 (eight) hours as needed for moderate pain.  (Patient not taking: Reported on 07/16/2020)     No current facility-administered medications for this visit.    ALLERGIES:  Allergies  Allergen Reactions  . Crab [Shellfish Allergy] Nausea And Vomiting    Throws up violently  . Other Nausea And Vomiting    Shrimp    PHYSICAL EXAM:  Performance status (ECOG): 1 - Symptomatic but completely ambulatory  Vitals:   07/16/20 1222  BP: (!) 168/83  Pulse: 61  Resp: 18  Temp: (!) 96.9 F (36.1 C)  SpO2: 97%   Wt Readings from Last 3 Encounters:  07/16/20 157 lb 6.4 oz (71.4 kg)  06/25/20 154 lb 11.2 oz (70.2 kg)   06/04/20 154 lb 6.4 oz (70 kg)   Physical Exam Vitals reviewed.  Constitutional:      Appearance: Normal appearance.  Cardiovascular:     Rate and Rhythm: Normal rate and regular rhythm.     Pulses: Normal pulses.     Heart sounds: Normal heart sounds.  Pulmonary:     Effort: Pulmonary effort is normal.     Breath sounds: Normal breath sounds.  Chest:     Comments: Port-a-Cath in L chest Abdominal:     Palpations: Abdomen is soft. There is no mass.     Tenderness: There is no abdominal tenderness.  Lymphadenopathy:     Upper Body:     Left upper body: Axillary adenopathy present.  Neurological:     General: No focal deficit present.     Mental Status: She is alert and oriented to person, place, and time.  Psychiatric:        Mood and Affect: Mood normal.        Behavior: Behavior normal.     LABORATORY DATA:  I have reviewed the labs as listed.  CBC Latest Ref Rng & Units 07/16/2020 06/25/2020 06/04/2020  WBC 4.0 - 10.5 K/uL 6.1 6.4 5.5  Hemoglobin 12.0 - 15.0 g/dL 14.3 14.9 14.4  Hematocrit 36 - 46 % 45.9 48.0(H) 46.6(H)  Platelets 150 - 400 K/uL 162 177 184   CMP Latest Ref Rng & Units 07/16/2020 06/25/2020 06/04/2020  Glucose 70 - 99 mg/dL 110(H) 101(H) 100(H)  BUN 8 - 23 mg/dL 20 18 25(H)  Creatinine 0.44 - 1.00 mg/dL 1.28(H) 1.45(H) 1.53(H)  Sodium 135 - 145 mmol/L 140 140 140  Potassium 3.5 - 5.1 mmol/L 4.0 3.9 4.1  Chloride 98 - 111 mmol/L 104 106 106  CO2 22 - 32 mmol/L 25  25 25  Calcium 8.9 - 10.3 mg/dL 9.6 9.6 9.7  Total Protein 6.5 - 8.1 g/dL 6.9 7.1 6.7  Total Bilirubin 0.3 - 1.2 mg/dL 0.9 0.9 0.8  Alkaline Phos 38 - 126 U/L 74 84 93  AST 15 - 41 U/L 18 22 19   ALT 0 - 44 U/L 15 21 18    Lab Results  Component Value Date   CEA1 10.8 (H) 06/04/2020   CEA1 12.7 (H) 05/14/2020   CEA1 13.4 (H) 04/24/2020    DIAGNOSTIC IMAGING:  I have independently reviewed the scans and discussed with the patient. No results found.   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 Right hemicolectomy on 10/11/2019, grade 3 adenocarcinoma, negative margins, positive LVSI, 2 tumor deposits, 0/15 lymph nodes positive, PT3PN1C. -MMR with loss of nuclear expression. MSI-high. MLH1 hyper methylation present. -PET scan in December 2020 showed lymph node in the right axillary region. Biopsy consistent with metastatic colon cancer. -Last CEA was 10.7 on 11/22/2019. -Right axillary lymph node excision on 04/02/2020 consistent with metastatic carcinoma from colon cancer. -Pembrolizumab started on 05/14/2020.  2.Stage I (PT1CPN0) right Breast, Grade 2 Invasive Lobular Carcinoma: -MRI of the breast on 12/25/2019 showed 1 cm mass behind the right areola with a suspicious mass in the left breast. -Right breast lumpectomy on 04/02/2020 shows invasive lobular carcinoma, grade 2, 1.2 cm. Resection margins are negative. Negative LVSI. 2 sentinel lymph nodes were negative for carcinoma. ER/PR 100% positive, HER-2 negative, Ki-67 10%.   PLAN:  1.Stage IV colon cancer to the right axillary lymph node: -She is tolerating immunotherapy very well.  She has occasional diarrhea but not more than 3-4 times per week. -I reviewed her labs.  Creatinine is 1.28 and slightly better.  LFTs are normal.  CBC was also normal. -Last TSH was 5.38 on 06/25/2020.  We will closely monitor. -We will proceed with her next cycle today.  Last CEA was 10.8. -There is a small palpable axillary lymph node.  I plan to repeat PET scan prior to next visit.  2. Right Breast, Grade 2 Invasive Lobular Carcinoma: -Continue anastrozole.  3. Normocytic anemia: -Hemoglobin today is 14.3.  No Feraheme needed.  4. Bladder cancer: -Cystoscopy was done at [facility] for hematuria.   Orders placed this encounter:  Orders Placed This Encounter  Procedures  . NM PET Image  Restag (PS) Skull Base To Thigh     Derek Jack, MD [place] [phone]   I, Milinda Antis, am acting as a scribe for Dr. Sanda Linger.  I, Derek Jack MD, have reviewed the above documentation for accuracy and completeness, and I agree with the above.

## 2020-07-16 NOTE — Progress Notes (Signed)
Patient's Port-a-cath accessed and blood drawn and sent to lab and left accessed for treatment today. Port flushed with good blood returned noted. No bruising, pain or swelling noted at site.

## 2020-07-16 NOTE — Progress Notes (Signed)
Patient tolerated therapy with no complaints voiced.  Side effects with management reviewed with understanding verbalized.  Port site clean and dry with no bruising or swelling noted at site.  Good blood return noted before and after administration of therapy.  Band aid applied.  Patient left in satisfactory condition with VSS and no s/s of distress noted.  

## 2020-07-16 NOTE — Patient Instructions (Signed)
[place] at [facility] Discharge Instructions  You were seen today by Dr. Delton Coombes. He went over your recent results. You received your treatment today. You will be scheduled for a PET scan before your next visit. Apply moisturizing lotion on dry patches of skin. Dr. Delton Coombes will see you back in 3 weeks for labs and follow up.   Thank you for choosing [place] at [facility] to provide your oncology and hematology care.  To afford each patient quality time with our provider, please arrive at least 15 minutes before your scheduled appointment time.   If you have a lab appointment with the [place] [facility] please come in thru the Main Entrance and check in at the main information desk  You need to re-schedule your appointment should you arrive 10 or more minutes late.  We strive to give you quality time with our providers, and arriving late affects you and other patients whose appointments are after yours.  Also, if you no show three or more times for appointments you may be dismissed from the clinic at the providers discretion.     Again, thank you for choosing [facility].  Our hope is that these requests will decrease the amount of time that you wait before being seen by our physicians.       _____________________________________________________________  Should you have questions after your visit to [facility], please contact our office at [phone] between the hours of 8:00 a.m. and 4:30 p.m.  Voicemails left after 4:00 p.m. will not be returned until the following business day.  For prescription refill requests, have your pharmacy contact our office and allow 72 hours.    Cancer Center Support Programs:   > Cancer Support Group  2nd Tuesday of the month 1pm-2pm, Journey Room

## 2020-07-17 LAB — CEA: CEA: 11.5 ng/mL — ABNORMAL HIGH (ref 0.0–4.7)

## 2020-08-02 ENCOUNTER — Other Ambulatory Visit: Payer: Self-pay | Admitting: Cardiology

## 2020-08-05 ENCOUNTER — Other Ambulatory Visit: Payer: Self-pay

## 2020-08-05 ENCOUNTER — Encounter (HOSPITAL_COMMUNITY)
Admission: RE | Admit: 2020-08-05 | Discharge: 2020-08-05 | Disposition: A | Discharge status: Home / Self Care | Payer: Medicare Other | Source: Ambulatory Visit | Attending: Hematology | Admitting: Hematology

## 2020-08-05 DIAGNOSIS — C50911 Malignant neoplasm of unspecified site of right female breast: Secondary | ICD-10-CM | POA: Diagnosis present

## 2020-08-05 MED ORDER — FLUDEOXYGLUCOSE F - 18 (FDG) INJECTION
9.6100 | Freq: Once | INTRAVENOUS | Status: AC | PRN
  Administered 2020-08-05: 9.61 via INTRAVENOUS

## 2020-08-06 ENCOUNTER — Inpatient Hospital Stay (HOSPITAL_COMMUNITY): Payer: Medicare Other | Attending: Hematology | Admitting: Hematology

## 2020-08-06 ENCOUNTER — Inpatient Hospital Stay (HOSPITAL_COMMUNITY): Payer: Medicare Other

## 2020-08-06 VITALS — BP 146/62 | HR 53 | Temp 97.1°F | Resp 18

## 2020-08-06 VITALS — BP 155/70 | HR 65 | Temp 97.1°F | Resp 18 | Wt 158.4 lb

## 2020-08-06 DIAGNOSIS — E7800 Pure hypercholesterolemia, unspecified: Secondary | ICD-10-CM | POA: Diagnosis not present

## 2020-08-06 DIAGNOSIS — Z170 Estrogen receptor positive status [ER+]: Secondary | ICD-10-CM | POA: Diagnosis not present

## 2020-08-06 DIAGNOSIS — Z79811 Long term (current) use of aromatase inhibitors: Secondary | ICD-10-CM | POA: Insufficient documentation

## 2020-08-06 DIAGNOSIS — R319 Hematuria, unspecified: Secondary | ICD-10-CM | POA: Insufficient documentation

## 2020-08-06 DIAGNOSIS — I739 Peripheral vascular disease, unspecified: Secondary | ICD-10-CM | POA: Diagnosis not present

## 2020-08-06 DIAGNOSIS — C182 Malignant neoplasm of ascending colon: Secondary | ICD-10-CM

## 2020-08-06 DIAGNOSIS — Z9049 Acquired absence of other specified parts of digestive tract: Secondary | ICD-10-CM | POA: Insufficient documentation

## 2020-08-06 DIAGNOSIS — I10 Essential (primary) hypertension: Secondary | ICD-10-CM | POA: Diagnosis not present

## 2020-08-06 DIAGNOSIS — M1990 Unspecified osteoarthritis, unspecified site: Secondary | ICD-10-CM | POA: Diagnosis not present

## 2020-08-06 DIAGNOSIS — I2510 Atherosclerotic heart disease of native coronary artery without angina pectoris: Secondary | ICD-10-CM | POA: Diagnosis not present

## 2020-08-06 DIAGNOSIS — R197 Diarrhea, unspecified: Secondary | ICD-10-CM | POA: Insufficient documentation

## 2020-08-06 DIAGNOSIS — Z9221 Personal history of antineoplastic chemotherapy: Secondary | ICD-10-CM | POA: Insufficient documentation

## 2020-08-06 DIAGNOSIS — I252 Old myocardial infarction: Secondary | ICD-10-CM | POA: Insufficient documentation

## 2020-08-06 DIAGNOSIS — C773 Secondary and unspecified malignant neoplasm of axilla and upper limb lymph nodes: Secondary | ICD-10-CM | POA: Insufficient documentation

## 2020-08-06 DIAGNOSIS — C50911 Malignant neoplasm of unspecified site of right female breast: Secondary | ICD-10-CM | POA: Insufficient documentation

## 2020-08-06 DIAGNOSIS — E039 Hypothyroidism, unspecified: Secondary | ICD-10-CM | POA: Insufficient documentation

## 2020-08-06 DIAGNOSIS — K219 Gastro-esophageal reflux disease without esophagitis: Secondary | ICD-10-CM | POA: Diagnosis not present

## 2020-08-06 DIAGNOSIS — R7989 Other specified abnormal findings of blood chemistry: Secondary | ICD-10-CM | POA: Insufficient documentation

## 2020-08-06 DIAGNOSIS — Z5112 Encounter for antineoplastic immunotherapy: Secondary | ICD-10-CM | POA: Insufficient documentation

## 2020-08-06 LAB — COMPREHENSIVE METABOLIC PANEL
ALT: 23 U/L (ref 0–44)
AST: 24 U/L (ref 15–41)
Albumin: 4 g/dL (ref 3.5–5.0)
Alkaline Phosphatase: 77 U/L (ref 38–126)
Anion gap: 10 (ref 5–15)
BUN: 20 mg/dL (ref 8–23)
CO2: 25 mmol/L (ref 22–32)
Calcium: 9.5 mg/dL (ref 8.9–10.3)
Chloride: 106 mmol/L (ref 98–111)
Creatinine, Ser: 1.78 mg/dL — ABNORMAL HIGH (ref 0.44–1.00)
GFR calc Af Amer: 30 mL/min — ABNORMAL LOW (ref >60)
GFR calc non Af Amer: 26 mL/min — ABNORMAL LOW (ref >60)
Glucose, Bld: 108 mg/dL — ABNORMAL HIGH (ref 70–99)
Potassium: 3.7 mmol/L (ref 3.5–5.1)
Sodium: 141 mmol/L (ref 135–145)
Total Bilirubin: 0.6 mg/dL (ref 0.3–1.2)
Total Protein: 6.7 g/dL (ref 6.5–8.1)

## 2020-08-06 LAB — CBC WITH DIFFERENTIAL/PLATELET
Abs Immature Granulocytes: 0.01 10*3/uL (ref 0.00–0.07)
Basophils Absolute: 0.1 10*3/uL (ref 0.0–0.1)
Basophils Relative: 1 %
Eosinophils Absolute: 0.1 10*3/uL (ref 0.0–0.5)
Eosinophils Relative: 1 %
HCT: 45.3 % (ref 36.0–46.0)
Hemoglobin: 14.2 g/dL (ref 12.0–15.0)
Immature Granulocytes: 0 %
Lymphocytes Relative: 25 %
Lymphs Abs: 1.3 10*3/uL (ref 0.7–4.0)
MCH: 29.6 pg (ref 26.0–34.0)
MCHC: 31.3 g/dL (ref 30.0–36.0)
MCV: 94.4 fL (ref 80.0–100.0)
Monocytes Absolute: 0.4 10*3/uL (ref 0.1–1.0)
Monocytes Relative: 8 %
Neutro Abs: 3.4 10*3/uL (ref 1.7–7.7)
Neutrophils Relative %: 65 %
Platelets: 171 10*3/uL (ref 150–400)
RBC: 4.8 MIL/uL (ref 3.87–5.11)
RDW: 13.9 % (ref 11.5–15.5)
WBC: 5.2 10*3/uL (ref 4.0–10.5)
nRBC: 0 % (ref 0.0–0.2)

## 2020-08-06 MED ORDER — SODIUM CHLORIDE 0.9 % IV SOLN: INTRAVENOUS | Status: AC

## 2020-08-06 MED ORDER — HEPARIN SOD (PORK) LOCK FLUSH 100 UNIT/ML IV SOLN
500.0000 [IU] | Freq: Once | INTRAVENOUS | Status: AC | PRN
  Administered 2020-08-06: 500 [IU]

## 2020-08-06 MED ORDER — SODIUM CHLORIDE 0.9 % IV SOLN
200.0000 mg | Freq: Once | INTRAVENOUS | Status: AC
  Administered 2020-08-06: 200 mg via INTRAVENOUS
  Filled 2020-08-06: qty 8

## 2020-08-06 MED ORDER — SODIUM CHLORIDE 0.9 % IV SOLN: Freq: Once | INTRAVENOUS | Status: AC

## 2020-08-06 MED ORDER — SODIUM CHLORIDE 0.9% FLUSH
10.0000 mL | INTRAVENOUS | Status: DC | PRN
  Administered 2020-08-06: 10 mL

## 2020-08-06 NOTE — Patient Instructions (Signed)
[facility] at [facility] Discharge Instructions  Labs drawn from portacath today   Thank you for choosing [facility] at [facility] to provide your oncology and hematology care.  To afford each patient quality time with our provider, please arrive at least 15 minutes before your scheduled appointment time.   If you have a lab appointment with the Cancer Center please come in thru the Main Entrance and check in at the main information desk.  You need to re-schedule your appointment should you arrive 10 or more minutes late.  We strive to give you quality time with our providers, and arriving late affects you and other patients whose appointments are after yours.  Also, if you no show three or more times for appointments you may be dismissed from the clinic at the providers discretion.     Again, thank you for choosing [facility].  Our hope is that these requests will decrease the amount of time that you wait before being seen by our physicians.       _____________________________________________________________  Should you have questions after your visit to [facility], please contact our office at [phone] and follow the prompts.  Our office hours are 8:00 a.m. and 4:30 p.m. Monday - Friday.  Please note that voicemails left after 4:00 p.m. may not be returned until the following business day.  We are closed weekends and major holidays.  You do have access to a nurse 24-7, just call the main number to the clinic [phone] and do not press any options, hold on the line and a nurse will answer the phone.    For prescription refill requests, have your pharmacy contact our office and allow 72 hours.    Due to Covid, you will need to wear a mask upon entering the hospital. If you do not have a mask, a mask will be given to you at the Main Entrance upon arrival. For doctor visits, patients may have 1 support person age 18  or older with them. For treatment visits, patients can not have anyone with them due to social distancing guidelines and our immunocompromised population.     

## 2020-08-06 NOTE — Progress Notes (Signed)
Patient was assessed by Dr. Delton Coombes and labs have been reviewed.  Creatinine is 1.78, she will get 593ml normal saline bolus today. Patient is okay to proceed with treatment today. Primary RN and pharmacy aware.

## 2020-08-06 NOTE — Patient Instructions (Signed)
[place] at [facility] Discharge Instructions  You were seen today by Dr. Delton Coombes. He went over your recent results and scans. You received your treatment today. If you start having 5 episodes or more of watery diarrhea in a day, please call the office immediately. Apply a moisturizing lotion to dry skin as needed. Drink at least 60 ounces of water daily. Dr. Delton Coombes will see you back in 3 weeks for labs and follow up.   Thank you for choosing [place] at [facility] to provide your oncology and hematology care.  To afford each patient quality time with our provider, please arrive at least 15 minutes before your scheduled appointment time.   If you have a lab appointment with the [facility] please come in thru the Main Entrance and check in at the main information desk  You need to re-schedule your appointment should you arrive 10 or more minutes late.  We strive to give you quality time with our providers, and arriving late affects you and other patients whose appointments are after yours.  Also, if you no show three or more times for appointments you may be dismissed from the clinic at the providers discretion.     Again, thank you for choosing [facility].  Our hope is that these requests will decrease the amount of time that you wait before being seen by our physicians.       _____________________________________________________________  Should you have questions after your visit to [facility], please contact our office at [phone] between the hours of 8:00 a.m. and 4:30 p.m.  Voicemails left after 4:00 p.m. will not be returned until the following business day.  For prescription refill requests, have your pharmacy contact our office and allow 72 hours.    Cancer Center Support Programs:   > Cancer Support Group  2nd Tuesday of the month 1pm-2pm, Journey Room

## 2020-08-06 NOTE — Progress Notes (Signed)
Jessica Taylor, [place] [address]   CLINIC:  Medical Oncology/Hematology  PCP:  Antony Contras, MD [address] / [place] [address] [phone]   REASON FOR VISIT:  Follow-up for stage I right breast cancer and colon cancer  PRIOR THERAPY:  1. Robotic proximal colectomy on 10/11/2019. 2. Right breast lumpectomy and SLNB on 04/02/2020.  NGS Results: MSI--high, Foundation 1 not sent  CURRENT THERAPY: Keytruda every 3 weeks  BRIEF ONCOLOGIC HISTORY:  Oncology History  Cancer of ascending colon s/p robotic proximal colectomy 10/11/2019  10/11/2019 Initial Diagnosis   Cancer of ascending colon s/p robotic proximal colectomy 10/11/2019   10/23/2019 Cancer Staging   Staging form: Colon and Rectum, AJCC 8th Edition - Clinical stage from 10/23/2019: Stage IVA (cT3, cN1c, cM1a) - Signed by Derek Jack, MD on 12/14/2019   05/14/2020 -  Chemotherapy   The patient had pembrolizumab (KEYTRUDA) 200 mg in sodium chloride 0.9 % 50 mL chemo infusion, 200 mg, Intravenous, Once, 4 of 8 cycles Administration: 200 mg (05/14/2020), 200 mg (06/25/2020), 200 mg (07/16/2020), 200 mg (06/04/2020)  for chemotherapy treatment.    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 [facility])   04/23/2020 Cancer Staging   Staging form: Breast, AJCC 8th Edition - Clinical stage from 04/23/2020: Stage IA (cT1c, cN0(sn), cM0, G2, ER+, PR+, HER2-) - Signed by Derek Jack, MD on 04/23/2020     CANCER STAGING: Cancer Staging Cancer of ascending colon s/p robotic proximal colectomy 10/11/2019 Staging form: Colon and Rectum, AJCC 8th Edition - Clinical stage from 10/23/2019: Stage IVA (cT3, cN1c, cM1a) - Signed by Derek Jack, MD on 12/14/2019  Malignant neoplasm of right female breast [facility]) Staging form: Breast, AJCC 8th Edition - Clinical stage from  04/23/2020: Stage IA (cT1c, cN0(sn), cM0, G2, ER+, PR+, HER2-) - Signed by Derek Jack, MD on 04/23/2020   INTERVAL HISTORY:  Jessica Taylor, a 79 y.o. female, returns for routine follow-up and consideration for next cycle of immunotherapy. Jessica Taylor was last seen on 07/16/2020.  Due for cycle #5 of Keytruda today.   Today she is accompanied by her daughter. Overall, she tells me she has been feeling pretty well. She is tolerating the treatment well, though she gets 2 episodes of diarrhea every 3 weeks. She denies any itching or rash. She denies having any cough. She continues having hematuria most days with pink or brightly red urine, though she denies having any pain or passing stones since she has a history of kidney stones.  Overall, she feels ready for next cycle of immuno today.    REVIEW OF SYSTEMS:  Review of Systems  Constitutional: Positive for appetite change (mildly decreased) and fatigue (mild).  Respiratory: Negative for cough.   Gastrointestinal: Positive for diarrhea.  Genitourinary: Positive for hematuria.   Skin: Negative for itching and rash.  All other systems reviewed and are negative.   PAST MEDICAL/SURGICAL HISTORY:  Past Medical History:  Diagnosis Date  . Abdominal aortic aneurysm ([place])   . Acid reflux   . Anemia   . Arthritis    knees , R shoulder - tx /w injection - 11/2014  . Basal cell carcinoma (BCC) of dorsum of nose 2010   Resolved  . Cancer ([place])    basal cell on nose  . Colon cancer ([place])   . Coronary artery disease   . Hypercholesteremia   .  Hypertension   . Hypothyroidism   . Lt Acute pyelonephritis 09/11/2018  . MI, old 61  . Peripheral arterial disease (HCC)    hhigh-grade ostial bilateral calcified iliac stenosis with claudication  . Tobacco abuse   . Vertigo    when lays on left side.   Past Surgical History:  Procedure Laterality Date  . BREAST LUMPECTOMY WITH RADIOACTIVE SEED AND SENTINEL LYMPH NODE BIOPSY Bilateral  04/02/2020   Procedure: BILATERAL BREAST LUMPECTOMY WITH RADIOACTIVE SEED AND RIGHT SENTINEL LYMPH NODE BIOPSY AND RIGHT TARGETED AXILLARY LYMPH NODE BIOPSY;  Surgeon: Erroll Luna, MD;  Location: [facility];  Service: General;  Laterality: Bilateral;  . CARDIAC CATHETERIZATION  5 stents  . CARDIAC CATHETERIZATION N/A 01/20/2016   Procedure: Left Heart Cath and Coronary Angiography;  Surgeon: Peter M Martinique, MD;  Location: [facility] CV [facility];  Service: Cardiovascular;  Laterality: N/A;  . CATARACT EXTRACTION W/PHACO Left 05/24/2014   Procedure: CATARACT EXTRACTION PHACO AND INTRAOCULAR LENS PLACEMENT (IOC);  Surgeon: Tonny Branch, MD;  Location: [facility];  Service: Ophthalmology;  Laterality: Left;  CDE:  9.30  . CATARACT EXTRACTION W/PHACO Right 06/18/2014   Procedure: CATARACT EXTRACTION PHACO AND INTRAOCULAR LENS PLACEMENT RIGHT EYE CDE=10.84;  Surgeon: Tonny Branch, MD;  Location: [facility];  Service: Ophthalmology;  Laterality: Right;  . CORONARY ARTERY BYPASS GRAFT N/A 01/24/2016   Procedure: CORONARY ARTERY BYPASS GRAFTING (CABG);  Surgeon: Gaye Pollack, MD;  Location: [facility];  Service: Open Heart Surgery;  Laterality: N/A;  . CORONARY STENT PLACEMENT    . CORONARY STENT PLACEMENT  03/06/15   CFX DES  . CYSTOSCOPY/URETEROSCOPY/HOLMIUM LASER/STENT PLACEMENT Left 09/12/2018   Procedure: CYSTOSCOPY/URETEROSCOPY/STENT PLACEMENT;  Surgeon: Ceasar Mons, MD;  Location: [facility];  Service: Urology;  Laterality: Left;  . EYE SURGERY    . LEFT HEART CATH AND CORS/GRAFTS ANGIOGRAPHY N/A 09/12/2019   Procedure: LEFT HEART CATH AND CORS/GRAFTS ANGIOGRAPHY;  Surgeon: Troy Sine, MD;  Location: [facility];  Service: Cardiovascular;  Laterality: N/A;  . LEFT HEART CATHETERIZATION WITH CORONARY ANGIOGRAM N/A 03/06/2015   Procedure: LEFT HEART CATHETERIZATION WITH CORONARY ANGIOGRAM;  Surgeon: Lorretta Harp, MD;  Location: [facility] CATH LAB;  Service: Cardiovascular;  Laterality: N/A;   . PARTIAL KNEE ARTHROPLASTY Right 02/04/2015   Procedure: UNICOMPARTMENTAL KNEE;  Surgeon: Dorna Leitz, MD;  Location: [facility];  Service: Orthopedics;  Laterality: Right;  . PERIPHERAL VASCULAR CATHETERIZATION Bilateral 05/13/2015   Procedure: Lower Extremity Angiography;  Surgeon: Lorretta Harp, MD;  Location: [facility];  Service: Cardiovascular;  Laterality: Bilateral;  . PERIPHERAL VASCULAR CATHETERIZATION N/A 05/13/2015   Procedure: Abdominal Aortogram;  Surgeon: Lorretta Harp, MD;  Location: [facility];  Service: Cardiovascular;  Laterality: N/A;  . PERIPHERAL VASCULAR CATHETERIZATION Bilateral 06/27/2015   Procedure: Peripheral Vascular Intervention;  Surgeon: Lorretta Harp, MD;  Location: [facility];  Service: Cardiovascular;  Laterality: Bilateral;  ILIACS  . PERIPHERAL VASCULAR CATHETERIZATION Bilateral 06/27/2015   Procedure: Peripheral Vascular Atherectomy;  Surgeon: Lorretta Harp, MD;  Location: [facility];  Service: Cardiovascular;  Laterality: Bilateral;  . PORTACATH PLACEMENT Left 05/24/2020   Procedure: INSERTION PORT-A-CATH;  Surgeon: Aviva Signs, MD;  Location: [facility];  Service: General;  Laterality: Left;  . TEE WITHOUT CARDIOVERSION N/A 01/24/2016   Procedure: TRANSESOPHAGEAL ECHOCARDIOGRAM (TEE);  Surgeon: Gaye Pollack, MD;  Location: [facility];  Service: Open Heart Surgery;  Laterality: N/A;  . TONSILLECTOMY  age 21  . TUBAL LIGATION      SOCIAL HISTORY:  Social History   Socioeconomic History  . Marital status: Married    Spouse name: [name]  . Number of children: 4  . Years of education: Not on file  . Highest education level: Not on file  Occupational History  . Occupation: retired    Comment: worked as Presenter, broadcasting for [employer]  Tobacco Use  . Smoking status: Former Smoker    Packs/day: 1.50    Years: 42.00    Pack years: 63.00    Types: Cigarettes    Quit date: 05/19/1999    Years since quitting: 21.2  .  Smokeless tobacco: Never Used  Vaping Use  . Vaping Use: Never used  Substance and Sexual Activity  . Alcohol use: No  . Drug use: No  . Sexual activity: Yes    Birth control/protection: Surgical  Other Topics Concern  . Not on file  Social History Narrative  . Not on file   Social Determinants of Health   Financial Resource Strain:   . Difficulty of Paying Living Expenses: Not on file  Food Insecurity:   . Worried About Charity fundraiser in the Last Year: Not on file  . Ran Out of Food in the Last Year: Not on file  Transportation Needs:   . Lack of Transportation (Medical): Not on file  . Lack of Transportation (Non-Medical): Not on file  Physical Activity:   . Days of Exercise per Week: Not on file  . Minutes of Exercise per Session: Not on file  Stress:   . Feeling of Stress : Not on file  Social Connections:   . Frequency of Communication with Friends and Family: Not on file  . Frequency of Social Gatherings with Friends and Family: Not on file  . Attends Religious Services: Not on file  . Active Member of Clubs or Organizations: Not on file  . Attends Archivist Meetings: Not on file  . Marital Status: Not on file  Intimate Partner Violence:   . Fear of Current or Ex-Partner: Not on file  . Emotionally Abused: Not on file  . Physically Abused: Not on file  . Sexually Abused: Not on file    FAMILY HISTORY:  Family History  Problem Relation Age of Onset  . Ovarian cancer Mother 38  . Cancer Father 26       unsure of which kind, "it was in his glands"  . Hypertension Maternal Grandmother   . Stroke Maternal Grandfather   . Hypertension Son   . Brain cancer Sister   . Hypertension Daughter   . Heart attack Neg Hx   . Colon cancer Neg Hx   . Esophageal cancer Neg Hx   . Inflammatory bowel disease Neg Hx   . Liver disease Neg Hx   . Pancreatic cancer Neg Hx   . Rectal cancer Neg Hx   . Stomach cancer Neg Hx     CURRENT MEDICATIONS:  Current  Outpatient Medications  Medication Sig Dispense Refill  . anastrozole (ARIMIDEX) 1 MG tablet Take 1 tablet (1 mg total) by mouth daily. 30 tablet 6  . aspirin EC 81 MG tablet Take 81 mg by mouth every evening.     Marland Kitchen atorvastatin (LIPITOR) 40 MG tablet TAKE (1) TABLET BY MOUTH ONCE DAILY. (Patient taking differently: Take 40 mg by mouth daily. ) 90 tablet 3  . clopidogrel (PLAVIX) 75 MG tablet TAKE (1) TABLET  BY MOUTH ONCE DAILY. (Patient taking differently: Take 75 mg by mouth daily. ) 90 tablet 3  . esomeprazole (NEXIUM) 20 MG capsule Take 20 mg by mouth daily.     Marland Kitchen levothyroxine (SYNTHROID, LEVOTHROID) 50 MCG tablet Take 50 mcg by mouth daily before breakfast.    . metoprolol tartrate (LOPRESSOR) 25 MG tablet Take 1 tablet (25 mg total) by mouth 2 (two) times daily. 180 tablet 3  . Pembrolizumab (KEYTRUDA IV) Inject 200 mg into the vein every 21 ( twenty-one) days.     . traMADol (ULTRAM) 50 MG tablet Take 50 mg by mouth every 8 (eight) hours as needed for moderate pain.     Marland Kitchen lidocaine-prilocaine (EMLA) cream Apply to affected area once (Patient not taking: Reported on 08/06/2020) 30 g 3  . nitroGLYCERIN (NITROSTAT) 0.4 MG SL tablet Place 1 tablet (0.4 mg total) under the tongue every 5 (five) minutes as needed for chest pain. (Patient not taking: Reported on 08/06/2020) 25 tablet prn  . prochlorperazine (COMPAZINE) 10 MG tablet Take 1 tablet (10 mg total) by mouth every 6 (six) hours as needed (Nausea or vomiting). (Patient not taking: Reported on 08/06/2020) 30 tablet 1   No current facility-administered medications for this visit.    ALLERGIES:  Allergies  Allergen Reactions  . Crab [Shellfish Allergy] Nausea And Vomiting    Throws up violently  . Other Nausea And Vomiting    Shrimp    PHYSICAL EXAM:  Performance status (ECOG): 1 - Symptomatic but completely ambulatory  Vitals:   08/06/20 1213  BP: (!) 155/70  Pulse: 65  Resp: 18  Temp: (!) 97.1 F (36.2 C)  SpO2: 98%   Wt  Readings from Last 3 Encounters:  08/06/20 158 lb 6.4 oz (71.8 kg)  07/16/20 157 lb 6.4 oz (71.4 kg)  06/25/20 154 lb 11.2 oz (70.2 kg)   Physical Exam Vitals reviewed.  Constitutional:      Appearance: Normal appearance.  Cardiovascular:     Rate and Rhythm: Normal rate and regular rhythm.     Pulses: Normal pulses.     Heart sounds: Normal heart sounds.  Pulmonary:     Effort: Pulmonary effort is normal.     Breath sounds: Normal breath sounds.  Chest:     Comments: Port-a-Cath in L chest Musculoskeletal:     Right lower leg: No edema.     Left lower leg: No edema.  Lymphadenopathy:     Upper Body:     Right upper body: No supraclavicular, axillary or pectoral adenopathy.     Left upper body: No supraclavicular, axillary or pectoral adenopathy.  Neurological:     General: No focal deficit present.     Mental Status: She is alert and oriented to person, place, and time.  Psychiatric:        Mood and Affect: Mood normal.        Behavior: Behavior normal.     LABORATORY DATA:  I have reviewed the labs as listed.  CBC Latest Ref Rng & Units 08/06/2020 07/16/2020 06/25/2020  WBC 4.0 - 10.5 K/uL 5.2 6.1 6.4  Hemoglobin 12.0 - 15.0 g/dL 14.2 14.3 14.9  Hematocrit 36 - 46 % 45.3 45.9 48.0(H)  Platelets 150 - 400 K/uL 171 162 177   CMP Latest Ref Rng & Units 08/06/2020 07/16/2020 06/25/2020  Glucose 70 - 99 mg/dL 108(H) 110(H) 101(H)  BUN 8 - 23 mg/dL _0 Creatinine 0.44 - 1.00 mg/dL 1.78(H) 1.28(H) 1.45(H)  Sodium 135 - 145 mmol/L 141 140 140  Potassium 3.5 - 5.1 mmol/L 3.7 4.0 3.9  Chloride 98 - 111 mmol/L 106 104 106  CO2 22 - 32 mmol/L _0 Calcium 8.9 - 10.3 mg/dL 9.5 9.6 9.6  Total Protein 6.5 - 8.1 g/dL 6.7 6.9 7.1  Total Bilirubin 0.3 - 1.2 mg/dL 0.6 0.9 0.9  Alkaline Phos 38 - 126 U/L 77 74 84  AST 15 - 41 U/L _1 ALT 0 - 44 U/L _2 Lab Results  Component Value Date   CEA1 11.5 (H) 07/16/2020   CEA1 10.8 (H) 06/04/2020   CEA1 12.7 (H)  05/14/2020    DIAGNOSTIC IMAGING:  I have independently reviewed the scans and discussed with the patient. NM PET Image Restag (PS) Skull Base To Thigh  Result Date: 08/06/2020 CLINICAL DATA:  Subsequent treatment strategy for colon cancer, status post resection and chemotherapy. History of right breast cancer, status post surgery in 2020. EXAM: NUCLEAR MEDICINE PET SKULL BASE TO THIGH TECHNIQUE: 9.6 mCi F-18 FDG was injected intravenously. Full-ring PET imaging was performed from the skull base to thigh after the radiotracer. CT data was obtained and used for attenuation correction and anatomic localization. Fasting blood glucose: 126 mg/dl COMPARISON:  05/02/2020 FINDINGS: Mediastinal blood pool activity: SUV max 2.9 Liver activity: SUV max NA NECK: No hypermetabolic cervical lymphadenopathy. Incidental CT findings: none CHEST: No hypermetabolic thoracic lymphadenopathy. Mild vague hypermetabolism in the right axilla, likely postsurgical. Mild residual hypermetabolism along the lateral right pectoralis muscle, max SUV 2.1, previously 5.6. No suspicious pulmonary nodules. Status post right breast lumpectomy. Left chest port terminates in the mid SVC. Incidental CT findings: Atherosclerotic calcifications of the aortic arch. Three vessel coronary atherosclerosis. ABDOMEN/PELVIS: No abnormal hypermetabolism in the liver, spleen, pancreas, or adrenal glands. No hypermetabolic abdominopelvic lymphadenopathy. Status post right hemicolectomy. Incidental CT findings: Atherosclerotic calcifications the abdominal aorta and branch vessels. Small left renal cysts. Left colonic diverticulosis, without evidence of diverticulitis. SKELETON: No focal hypermetabolic activity to suggest skeletal metastasis. Incidental CT findings: Degenerative changes of the visualized thoracolumbar spine. Median sternotomy. IMPRESSION: Status post right breast lumpectomy and right hemicolectomy. Mild vague residual hypermetabolism in the  right axilla and lateral right pectoralis muscle, without soft tissue mass. No findings suspicious for recurrent or metastatic disease. Electronically Signed   By: Julian Hy M.D.   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 Right hemicolectomy on 10/11/2019, grade 3 adenocarcinoma, negative margins, positive LVSI, 2 tumor deposits, 0/15 lymph nodes positive, PT3PN1C. -MMR with loss of nuclear expression. MSI-high. MLH1 hyper methylation present. -PET scan in December 2020 showed lymph node in the right axillary region. Biopsy consistent with metastatic colon cancer. -Last CEA was 10.7 on 11/22/2019. -Right axillary lymph node excision on 04/02/2020 consistent with metastatic carcinoma from colon cancer. -Pembrolizumab started on 05/14/2020. -PET scan on 08/05/2020 shows complete resolution of lymphadenopathy.  No evidence of new areas of uptake.  Mild vague residual hypermetabolism in the right axilla without soft tissue mass.  2.Stage I (PT1CPN0) right Breast, Grade 2 Invasive Lobular Carcinoma: -MRI of the breast on 12/25/2019 showed 1 cm mass behind the right areola with a suspicious mass in the left breast. -Right breast lumpectomy on 04/02/2020 shows invasive lobular carcinoma, grade 2, 1.2 cm. Resection margins are negative. Negative LVSI. 2 sentinel lymph nodes were  negative for carcinoma. ER/PR 100% positive, HER-2 negative, Ki-67 10%.   PLAN:  1.Stage IV colon cancer to the right axillary lymph node: -She is pembrolizumab very well.  Denies any immunotherapy related side effects.  I have reviewed her labs from today which shows normal LFTs and CBC.  Last TSH was 5.38. -I discussed the images and results of PET scan from 08/05/2020 which showed near complete resolution of lymphadenopathy. -I have recommended continuation of pembrolizumab  until progression or intolerance. -She will proceed with her treatment today.  She will come back in 3 weeks for follow-up.  2.  Right breast invasive lobular carcinoma, grade 2: -Continue anastrozole.  3. Normocytic anemia: -No Feraheme needed as hemoglobin is 14.2 today.  4. Bladder cancer: -She had previous cystoscopy at [facility] for hematuria. -She has intermittent hematuria for several years.  5.  Elevated creatinine: -Creatinine today is 1.78, up from 1.28 at last visit.  Her baseline is around 1.5. -She will receive extra hydration today.  She was encouraged to drink at least 8200 ounces of water daily.   Orders placed this encounter:  No orders of the defined types were placed in this encounter.    Derek Jack, MD [place] [phone]   I, Milinda Antis, am acting as a scribe for Dr. Sanda Linger.  I, Derek Jack MD, have reviewed the above documentation for accuracy and completeness, and I agree with the above.

## 2020-08-06 NOTE — Progress Notes (Signed)
Patient tolerated chemotherapy with no complaints voiced.  Side effects with management reviewed with understanding verbalized.  Port site clean and dry with no bruising or swelling noted at site.  Good blood return noted before and after administration of chemotherapy.  Band aid applied.  Patient left in satisfactory condition with VSS and no s/s of distress noted.   

## 2020-08-07 LAB — CEA: CEA: 9.9 ng/mL — ABNORMAL HIGH (ref 0.0–4.7)

## 2020-08-27 ENCOUNTER — Inpatient Hospital Stay (HOSPITAL_COMMUNITY): Payer: Medicare Other

## 2020-08-27 ENCOUNTER — Inpatient Hospital Stay (HOSPITAL_COMMUNITY): Payer: Medicare Other | Attending: Hematology | Admitting: Hematology

## 2020-08-27 ENCOUNTER — Other Ambulatory Visit: Payer: Self-pay

## 2020-08-27 VITALS — BP 177/60 | HR 56 | Temp 97.1°F | Resp 18 | Wt 163.5 lb

## 2020-08-27 VITALS — BP 167/71 | HR 50 | Temp 97.2°F | Resp 18

## 2020-08-27 DIAGNOSIS — R319 Hematuria, unspecified: Secondary | ICD-10-CM | POA: Insufficient documentation

## 2020-08-27 DIAGNOSIS — I2510 Atherosclerotic heart disease of native coronary artery without angina pectoris: Secondary | ICD-10-CM | POA: Diagnosis not present

## 2020-08-27 DIAGNOSIS — C50911 Malignant neoplasm of unspecified site of right female breast: Secondary | ICD-10-CM | POA: Insufficient documentation

## 2020-08-27 DIAGNOSIS — Z85828 Personal history of other malignant neoplasm of skin: Secondary | ICD-10-CM | POA: Insufficient documentation

## 2020-08-27 DIAGNOSIS — Z5112 Encounter for antineoplastic immunotherapy: Secondary | ICD-10-CM | POA: Diagnosis not present

## 2020-08-27 DIAGNOSIS — Z87891 Personal history of nicotine dependence: Secondary | ICD-10-CM | POA: Insufficient documentation

## 2020-08-27 DIAGNOSIS — R630 Anorexia: Secondary | ICD-10-CM | POA: Insufficient documentation

## 2020-08-27 DIAGNOSIS — Z170 Estrogen receptor positive status [ER+]: Secondary | ICD-10-CM | POA: Diagnosis not present

## 2020-08-27 DIAGNOSIS — R079 Chest pain, unspecified: Secondary | ICD-10-CM | POA: Diagnosis not present

## 2020-08-27 DIAGNOSIS — Z79811 Long term (current) use of aromatase inhibitors: Secondary | ICD-10-CM | POA: Diagnosis not present

## 2020-08-27 DIAGNOSIS — C182 Malignant neoplasm of ascending colon: Secondary | ICD-10-CM | POA: Diagnosis not present

## 2020-08-27 DIAGNOSIS — E7800 Pure hypercholesterolemia, unspecified: Secondary | ICD-10-CM | POA: Diagnosis not present

## 2020-08-27 DIAGNOSIS — R5383 Other fatigue: Secondary | ICD-10-CM | POA: Insufficient documentation

## 2020-08-27 DIAGNOSIS — Z9221 Personal history of antineoplastic chemotherapy: Secondary | ICD-10-CM | POA: Insufficient documentation

## 2020-08-27 DIAGNOSIS — Z85038 Personal history of other malignant neoplasm of large intestine: Secondary | ICD-10-CM | POA: Insufficient documentation

## 2020-08-27 DIAGNOSIS — M1990 Unspecified osteoarthritis, unspecified site: Secondary | ICD-10-CM | POA: Insufficient documentation

## 2020-08-27 DIAGNOSIS — D649 Anemia, unspecified: Secondary | ICD-10-CM | POA: Insufficient documentation

## 2020-08-27 DIAGNOSIS — Z9049 Acquired absence of other specified parts of digestive tract: Secondary | ICD-10-CM | POA: Insufficient documentation

## 2020-08-27 DIAGNOSIS — R7989 Other specified abnormal findings of blood chemistry: Secondary | ICD-10-CM | POA: Diagnosis not present

## 2020-08-27 DIAGNOSIS — Z79899 Other long term (current) drug therapy: Secondary | ICD-10-CM | POA: Insufficient documentation

## 2020-08-27 DIAGNOSIS — I739 Peripheral vascular disease, unspecified: Secondary | ICD-10-CM | POA: Insufficient documentation

## 2020-08-27 DIAGNOSIS — Z7982 Long term (current) use of aspirin: Secondary | ICD-10-CM | POA: Insufficient documentation

## 2020-08-27 DIAGNOSIS — K219 Gastro-esophageal reflux disease without esophagitis: Secondary | ICD-10-CM | POA: Diagnosis not present

## 2020-08-27 DIAGNOSIS — I10 Essential (primary) hypertension: Secondary | ICD-10-CM | POA: Diagnosis not present

## 2020-08-27 DIAGNOSIS — E039 Hypothyroidism, unspecified: Secondary | ICD-10-CM | POA: Insufficient documentation

## 2020-08-27 LAB — CBC WITH DIFFERENTIAL/PLATELET
Abs Immature Granulocytes: 0.02 10*3/uL (ref 0.00–0.07)
Basophils Absolute: 0.1 10*3/uL (ref 0.0–0.1)
Basophils Relative: 1 %
Eosinophils Absolute: 0.1 10*3/uL (ref 0.0–0.5)
Eosinophils Relative: 2 %
HCT: 43.1 % (ref 36.0–46.0)
Hemoglobin: 13.3 g/dL (ref 12.0–15.0)
Immature Granulocytes: 0 %
Lymphocytes Relative: 20 %
Lymphs Abs: 1.1 10*3/uL (ref 0.7–4.0)
MCH: 29.3 pg (ref 26.0–34.0)
MCHC: 30.9 g/dL (ref 30.0–36.0)
MCV: 94.9 fL (ref 80.0–100.0)
Monocytes Absolute: 0.4 10*3/uL (ref 0.1–1.0)
Monocytes Relative: 7 %
Neutro Abs: 4 10*3/uL (ref 1.7–7.7)
Neutrophils Relative %: 70 %
Platelets: 159 10*3/uL (ref 150–400)
RBC: 4.54 MIL/uL (ref 3.87–5.11)
RDW: 14.1 % (ref 11.5–15.5)
WBC: 5.7 10*3/uL (ref 4.0–10.5)
nRBC: 0 % (ref 0.0–0.2)

## 2020-08-27 LAB — COMPREHENSIVE METABOLIC PANEL
ALT: 23 U/L (ref 0–44)
AST: 22 U/L (ref 15–41)
Albumin: 3.8 g/dL (ref 3.5–5.0)
Alkaline Phosphatase: 72 U/L (ref 38–126)
Anion gap: 10 (ref 5–15)
BUN: 22 mg/dL (ref 8–23)
CO2: 26 mmol/L (ref 22–32)
Calcium: 9.2 mg/dL (ref 8.9–10.3)
Chloride: 104 mmol/L (ref 98–111)
Creatinine, Ser: 1.43 mg/dL — ABNORMAL HIGH (ref 0.44–1.00)
GFR calc non Af Amer: 34 mL/min — ABNORMAL LOW (ref >60)
Glucose, Bld: 111 mg/dL — ABNORMAL HIGH (ref 70–99)
Potassium: 4.1 mmol/L (ref 3.5–5.1)
Sodium: 140 mmol/L (ref 135–145)
Total Bilirubin: 0.7 mg/dL (ref 0.3–1.2)
Total Protein: 6.2 g/dL — ABNORMAL LOW (ref 6.5–8.1)

## 2020-08-27 LAB — TSH: TSH: 2.427 u[IU]/mL (ref 0.350–4.500)

## 2020-08-27 MED ORDER — HEPARIN SOD (PORK) LOCK FLUSH 100 UNIT/ML IV SOLN
500.0000 [IU] | Freq: Once | INTRAVENOUS | Status: AC | PRN
  Administered 2020-08-27: 500 [IU]

## 2020-08-27 MED ORDER — SODIUM CHLORIDE 0.9 % IV SOLN
200.0000 mg | Freq: Once | INTRAVENOUS | Status: AC
  Administered 2020-08-27: 200 mg via INTRAVENOUS
  Filled 2020-08-27: qty 8

## 2020-08-27 MED ORDER — SODIUM CHLORIDE 0.9 % IV SOLN: Freq: Once | INTRAVENOUS | Status: AC

## 2020-08-27 MED ORDER — SODIUM CHLORIDE 0.9% FLUSH
10.0000 mL | INTRAVENOUS | Status: DC | PRN
  Administered 2020-08-27: 10 mL

## 2020-08-27 NOTE — Progress Notes (Signed)
Jessica Taylor, [place] [address]   CLINIC:  Medical Oncology/Hematology  PCP:  Antony Contras, MD [address] / [place] [address] [phone]   REASON FOR VISIT:  Follow-up for stage I right breast cancer and colon cancer  PRIOR THERAPY:  1. Robotic proximal colectomy on 10/11/2019. 2. Right breast lumpectomy and SLNB on 04/02/2020.  NGS Results: MSI--high, Foundation 1 not sent  CURRENT THERAPY: Keytruda every 3 weeks  BRIEF ONCOLOGIC HISTORY:  Oncology History  Cancer of ascending colon s/p robotic proximal colectomy 10/11/2019  10/11/2019 Initial Diagnosis   Cancer of ascending colon s/p robotic proximal colectomy 10/11/2019   10/23/2019 Cancer Staging   Staging form: Colon and Rectum, AJCC 8th Edition - Clinical stage from 10/23/2019: Stage IVA (cT3, cN1c, cM1a) - Signed by Derek Jack, MD on 12/14/2019   05/14/2020 -  Chemotherapy   The patient had pembrolizumab (KEYTRUDA) 200 mg in sodium chloride 0.9 % 50 mL chemo infusion, 200 mg, Intravenous, Once, 5 of 8 cycles Administration: 200 mg (05/14/2020), 200 mg (06/25/2020), 200 mg (07/16/2020), 200 mg (06/04/2020), 200 mg (08/06/2020)  for chemotherapy treatment.    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 [facility])   04/23/2020 Cancer Staging   Staging form: Breast, AJCC 8th Edition - Clinical stage from 04/23/2020: Stage IA (cT1c, cN0(sn), cM0, G2, ER+, PR+, HER2-) - Signed by Derek Jack, MD on 04/23/2020     CANCER STAGING: Cancer Staging Cancer of ascending colon s/p robotic proximal colectomy 10/11/2019 Staging form: Colon and Rectum, AJCC 8th Edition - Clinical stage from 10/23/2019: Stage IVA (cT3, cN1c, cM1a) - Signed by Derek Jack, MD on 12/14/2019  Malignant neoplasm of right female breast [facility]) Staging form: Breast, AJCC 8th Edition -  Clinical stage from 04/23/2020: Stage IA (cT1c, cN0(sn), cM0, G2, ER+, PR+, HER2-) - Signed by Derek Jack, MD on 04/23/2020   INTERVAL HISTORY:  Jessica Taylor, a 79 y.o. female, returns for routine follow-up and consideration for next cycle of chemotherapy. [name] was last seen on 08/06/2020.  Due for cycle #6 of Keytruda today.   Overall, she tells me she has been feeling pretty well. She reportedly had an episode of palpitations last week after she sat down and took 1 tablet of nitroglycerin. She denies having diarrhea, rashes, cough. Her appetite is good  Overall, she feels ready for next cycle of chemo today.    REVIEW OF SYSTEMS:  Review of Systems  Constitutional: Positive for appetite change (50%) and fatigue (50%).  Respiratory: Negative for cough.   Cardiovascular: Positive for palpitations (occasional).  Gastrointestinal: Negative for diarrhea.  Genitourinary: Positive for hematuria.   Skin: Negative for rash.    PAST MEDICAL/SURGICAL HISTORY:  Past Medical History:  Diagnosis Date  . Abdominal aortic aneurysm ([place])   . Acid reflux   . Anemia   . Arthritis    knees , R shoulder - tx /w injection - 11/2014  . Basal cell carcinoma (BCC) of dorsum of nose 2010   Resolved  . Cancer ([place])    basal cell on nose  . Colon cancer ([place])   . Coronary artery disease   . Hypercholesteremia   . Hypertension   . Hypothyroidism   . Lt Acute pyelonephritis 09/11/2018  . 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 Peripheral arterial disease (H[facility])    hhigh-grade ostial bilateral calcified iliac stenosis with  claudication  . Tobacco abuse   . Vertigo    when lays on left side.   Past Surgical History:  Procedure Laterality Date  . BREAST LUMPECTOMY WITH RADIOACTIVE SEED AND SENTINEL LYMPH NODE BIOPSY Bilateral 04/02/2020   Procedure: BILATERAL BREAST LUMPECTOMY WITH RADIOACTIVE SEED AND RIGHT SENTINEL LYMPH NODE BIOPSY AND RIGHT TARGETED AXILLARY LYMPH NODE BIOPSY;  Surgeon: Erroll Luna,  MD;  Location: [place];  Service: General;  Laterality: Bilateral;  . CARDIAC CATHETERIZATION  5 stents  . CARDIAC CATHETERIZATION N/A 01/20/2016   Procedure: Left Heart Cath and Coronary Angiography;  Surgeon: Peter M Martinique, MD;  Location: [place] CV LAB;  Service: Cardiovascular;  Laterality: N/A;  . CATARACT EXTRACTION W/PHACO Left 05/24/2014   Procedure: CATARACT EXTRACTION PHACO AND INTRAOCULAR LENS PLACEMENT (IOC);  Surgeon: Tonny Branch, MD;  Location: [facility];  Service: Ophthalmology;  Laterality: Left;  CDE:  9.30  . CATARACT EXTRACTION W/PHACO Right 06/18/2014   Procedure: CATARACT EXTRACTION PHACO AND INTRAOCULAR LENS PLACEMENT RIGHT EYE CDE=10.84;  Surgeon: Tonny Branch, MD;  Location: [facility];  Service: Ophthalmology;  Laterality: Right;  . CORONARY ARTERY BYPASS GRAFT N/A 01/24/2016   Procedure: CORONARY ARTERY BYPASS GRAFTING (CABG);  Surgeon: Gaye Pollack, MD;  Location: [place];  Service: Open Heart Surgery;  Laterality: N/A;  . CORONARY STENT PLACEMENT    . CORONARY STENT PLACEMENT  03/06/15   CFX DES  . CYSTOSCOPY/URETEROSCOPY/HOLMIUM LASER/STENT PLACEMENT Left 09/12/2018   Procedure: CYSTOSCOPY/URETEROSCOPY/STENT PLACEMENT;  Surgeon: Ceasar Mons, MD;  Location: [facility];  Service: Urology;  Laterality: Left;  . EYE SURGERY    . LEFT HEART CATH AND CORS/GRAFTS ANGIOGRAPHY N/A 09/12/2019   Procedure: LEFT HEART CATH AND CORS/GRAFTS ANGIOGRAPHY;  Surgeon: Troy Sine, MD;  Location: [place] CV [facility];  Service: Cardiovascular;  Laterality: N/A;  . LEFT HEART CATHETERIZATION WITH CORONARY ANGIOGRAM N/A 03/06/2015   Procedure: LEFT HEART CATHETERIZATION WITH CORONARY ANGIOGRAM;  Surgeon: Lorretta Harp, MD;  Location: [facility] C[facility] LAB;  Service: Cardiovascular;  Laterality: N/A;  . PARTIAL KNEE ARTHROPLASTY Right 02/04/2015   Procedure: UNICOMPARTMENTAL KNEE;  Surgeon: Dorna Leitz, MD;  Location: [place];  Service: Orthopedics;  Laterality: Right;  .  PERIPHERAL VASCULAR CATHETERIZATION Bilateral 05/13/2015   Procedure: Lower Extremity Angiography;  Surgeon: Lorretta Harp, MD;  Location: [place] CV LAB;  Service: Cardiovascular;  Laterality: Bilateral;  . PERIPHERAL VASCULAR CATHETERIZATION N/A 05/13/2015   Procedure: Abdominal Aortogram;  Surgeon: Lorretta Harp, MD;  Location: [place] CV LAB;  Service: Cardiovascular;  Laterality: N/A;  . PERIPHERAL VASCULAR CATHETERIZATION Bilateral 06/27/2015   Procedure: Peripheral Vascular Intervention;  Surgeon: Lorretta Harp, MD;  Location: [place] CV LAB;  Service: Cardiovascular;  Laterality: Bilateral;  ILIACS  . PERIPHERAL VASCULAR CATHETERIZATION Bilateral 06/27/2015   Procedure: Peripheral Vascular Atherectomy;  Surgeon: Lorretta Harp, MD;  Location: [place] CV LAB;  Service: Cardiovascular;  Laterality: Bilateral;  . PORTACATH PLACEMENT Left 05/24/2020   Procedure: INSERTION PORT-A-CATH;  Surgeon: Aviva Signs, MD;  [facility];  Service: General;  Laterality: Left;  . TEE WITHOUT CARDIOVERSION N/A 01/24/2016   Procedure: TRANSESOPHAGEAL ECHOCARDIOGRAM (TEE);  Surgeon: Gaye Pollack, MD;  Location: [place];  Service: Open Heart Surgery;  Laterality: N/A;  . TONSILLECTOMY     age 58  . TUBAL LIGATION      SOCIAL HISTORY:  Social History   Socioeconomic History  . Marital status: Married    Spouse name: [name]  .  Number of children: 4  . Years of education: Not on file  . Highest education level: Not on file  Occupational History  . Occupation: retired    Comment: worked as Presenter, broadcasting for [employer]  Tobacco Use  . Smoking status: Former Smoker    Packs/day: 1.50    Years: 42.00    Pack years: 63.00    Types: Cigarettes    Quit date: 05/19/1999    Years since quitting: 21.2  . Smokeless tobacco: Never Used  Vaping Use  . Vaping Use: Never used  Substance and Sexual Activity  . Alcohol use: No  . Drug use: No  . Sexual activity: Yes    Birth  control/protection: Surgical  Other Topics Concern  . Not on file  Social History Narrative  . Not on file   Social Determinants of Health   Financial Resource Strain:   . Difficulty of Paying Living Expenses: Not on file  Food Insecurity:   . Worried About Charity fundraiser in the Last Year: Not on file  . Ran Out of Food in the Last Year: Not on file  Transportation Needs:   . Lack of Transportation (Medical): Not on file  . Lack of Transportation (Non-Medical): Not on file  Physical Activity:   . Days of Exercise per Week: Not on file  . Minutes of Exercise per Session: Not on file  Stress:   . Feeling of Stress : Not on file  Social Connections:   . Frequency of Communication with Friends and Family: Not on file  . Frequency of Social Gatherings with Friends and Family: Not on file  . Attends Religious Services: Not on file  . Active Member of Clubs or Organizations: Not on file  . Attends Archivist Meetings: Not on file  . Marital Status: Not on file  Intimate Partner Violence:   . Fear of Current or Ex-Partner: Not on file  . Emotionally Abused: Not on file  . Physically Abused: Not on file  . Sexually Abused: Not on file    FAMILY HISTORY:  Family History  Problem Relation Age of Onset  . Ovarian cancer Mother 2  . Cancer Father 2       unsure of which kind, "it was in his glands"  . Hypertension Maternal Grandmother   . Stroke Maternal Grandfather   . Hypertension Son   . Brain cancer Sister   . Hypertension Daughter   . Heart attack Neg Hx   . Colon cancer Neg Hx   . Esophageal cancer Neg Hx   . Inflammatory bowel disease Neg Hx   . Liver disease Neg Hx   . Pancreatic cancer Neg Hx   . Rectal cancer Neg Hx   . Stomach cancer Neg Hx     CURRENT MEDICATIONS:  Current Outpatient Medications  Medication Sig Dispense Refill  . anastrozole (ARIMIDEX) 1 MG tablet Take 1 tablet (1 mg total) by mouth daily. 30 tablet 6  . aspirin EC 81 MG  tablet Take 81 mg by mouth every evening.     Marland Kitchen atorvastatin (LIPITOR) 40 MG tablet TAKE (1) TABLET BY MOUTH ONCE DAILY. (Patient taking differently: Take 40 mg by mouth daily. ) 90 tablet 3  . clopidogrel (PLAVIX) 75 MG tablet TAKE (1) TABLET BY MOUTH ONCE DAILY. (Patient taking differently: Take 75 mg by mouth daily. ) 90 tablet 3  . esomeprazole (NEXIUM) 20 MG capsule Take 20 mg by mouth daily.     Marland Kitchen  levothyroxine (SYNTHROID, LEVOTHROID) 50 MCG tablet Take 50 mcg by mouth daily before breakfast.    . lidocaine-prilocaine (EMLA) cream Apply to affected area once 30 g 3  . metoprolol tartrate (LOPRESSOR) 25 MG tablet Take 1 tablet (25 mg total) by mouth 2 (two) times daily. 180 tablet 3  . Pembrolizumab (KEYTRUDA IV) Inject 200 mg into the vein every 21 ( twenty-one) days.     . traMADol (ULTRAM) 50 MG tablet Take 50 mg by mouth every 8 (eight) hours as needed for moderate pain.     . nitroGLYCERIN (NITROSTAT) 0.4 MG SL tablet Place 1 tablet (0.4 mg total) under the tongue every 5 (five) minutes as needed for chest pain. (Patient not taking: Reported on 08/27/2020) 25 tablet prn  . prochlorperazine (COMPAZINE) 10 MG tablet Take 1 tablet (10 mg total) by mouth every 6 (six) hours as needed (Nausea or vomiting). (Patient not taking: Reported on 08/27/2020) 30 tablet 1   No current facility-administered medications for this visit.    ALLERGIES:  Allergies  Allergen Reactions  . Crab [Shellfish Allergy] Nausea And Vomiting    Throws up violently  . Other Nausea And Vomiting    Shrimp    PHYSICAL EXAM:  Performance status (ECOG): 1 - Symptomatic but completely ambulatory  Vitals:   08/27/20 1238  BP: (!) 177/60  Pulse: (!) 56  Resp: 18  Temp: (!) 97.1 F (36.2 C)  SpO2: 97%   Wt Readings from Last 3 Encounters:  08/27/20 163 lb 8 oz (74.2 kg)  08/06/20 158 lb 6.4 oz (71.8 kg)  07/16/20 157 lb 6.4 oz (71.4 kg)   Physical Exam Chest:     Comments: Port-a-Cath in R  chest    LABORATORY DATA:  I have reviewed the labs as listed.  CBC Latest Ref Rng & Units 08/27/2020 08/06/2020 07/16/2020  WBC 4.0 - 10.5 K/uL 5.7 5.2 6.1  Hemoglobin 12.0 - 15.0 g/dL 13.3 14.2 14.3  Hematocrit 36 - 46 % 43.1 45.3 45.9  Platelets 150 - 400 K/uL 159 171 162   CMP Latest Ref Rng & Units 08/27/2020 08/06/2020 07/16/2020  Glucose 70 - 99 mg/dL 111(H) 108(H) 110(H)  BUN 8 - 23 mg/dL 22 20 20   Creatinine 0.44 - 1.00 mg/dL 1.43(H) 1.78(H) 1.28(H)  Sodium 135 - 145 mmol/L 140 141 140  Potassium 3.5 - 5.1 mmol/L 4.1 3.7 4.0  Chloride 98 - 111 mmol/L 104 106 104  CO2 22 - 32 mmol/L 26 25 25   Calcium 8.9 - 10.3 mg/dL 9.2 9.5 9.6  Total Protein 6.5 - 8.1 g/dL 6.2(L) 6.7 6.9  Total Bilirubin 0.3 - 1.2 mg/dL 0.7 0.6 0.9  Alkaline Phos 38 - 126 U/L 72 77 74  AST 15 - 41 U/L 22 24 18   ALT 0 - 44 U/L 23 23 15    Lab Results  Component Value Date   CEA1 9.9 (H) 08/06/2020   CEA1 11.5 (H) 07/16/2020   CEA1 10.8 (H) 06/04/2020    DIAGNOSTIC IMAGING:  I have independently reviewed the scans and discussed with the patient. NM PET Image Restag (PS) Skull Base To Thigh  Result Date: 08/06/2020 CLINICAL DATA:  Subsequent treatment strategy for colon cancer, status post resection and chemotherapy. History of right breast cancer, status post surgery in 2020. EXAM: NUCLEAR MEDICINE PET SKULL BASE TO THIGH TECHNIQUE: 9.6 mCi F-18 FDG was injected intravenously. Full-ring PET imaging was performed from the skull base to thigh after the radiotracer. CT data was obtained and used  for attenuation correction and anatomic localization. Fasting blood glucose: 126 mg/dl COMPARISON:  05/02/2020 FINDINGS: Mediastinal blood pool activity: SUV max 2.9 Liver activity: SUV max NA NECK: No hypermetabolic cervical lymphadenopathy. Incidental CT findings: none CHEST: No hypermetabolic thoracic lymphadenopathy. Mild vague hypermetabolism in the right axilla, likely postsurgical. Mild residual hypermetabolism  along the lateral right pectoralis muscle, max SUV 2.1, previously 5.6. No suspicious pulmonary nodules. Status post right breast lumpectomy. Left chest port terminates in the mid SVC. Incidental CT findings: Atherosclerotic calcifications of the aortic arch. Three vessel coronary atherosclerosis. ABDOMEN/PELVIS: No abnormal hypermetabolism in the liver, spleen, pancreas, or adrenal glands. No hypermetabolic abdominopelvic lymphadenopathy. Status post right hemicolectomy. Incidental CT findings: Atherosclerotic calcifications the abdominal aorta and branch vessels. Small left renal cysts. Left colonic diverticulosis, without evidence of diverticulitis. SKELETON: No focal hypermetabolic activity to suggest skeletal metastasis. Incidental CT findings: Degenerative changes of the visualized thoracolumbar spine. Median sternotomy. IMPRESSION: Status post right breast lumpectomy and right hemicolectomy. Mild vague residual hypermetabolism in the right axilla and lateral right pectoralis muscle, without soft tissue mass. No findings suspicious for recurrent or metastatic disease. Electronically Signed   By: Julian Hy M.D.   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 Right hemicolectomy on 10/11/2019, grade 3 adenocarcinoma, negative margins, positive LVSI, 2 tumor deposits, 0/15 lymph nodes positive, PT3PN1C. -MMR with loss of nuclear expression. MSI-high. MLH1 hyper methylation present. -PET scan in December 2020 showed lymph node in the right axillary region. Biopsy consistent with metastatic colon cancer. -Last CEA was 10.7 on 11/22/2019. -Right axillary lymph node excision on 04/02/2020 consistent with metastatic carcinoma from colon cancer. -Pembrolizumab started on 05/14/2020. -PET scan on 08/05/2020 shows complete resolution of lymphadenopathy.  No evidence of new  areas of uptake.  Mild vague residual hypermetabolism in the right axilla without soft tissue mass.  2.Stage I (PT1CPN0) right Breast, Grade 2 Invasive Lobular Carcinoma: -MRI of the breast on 12/25/2019 showed 1 cm mass behind the right areola with a suspicious mass in the left breast. -Right breast lumpectomy on 04/02/2020 shows invasive lobular carcinoma, grade 2, 1.2 cm. Resection margins are negative. Negative LVSI. 2 sentinel lymph nodes were negative for carcinoma. ER/PR 100% positive, HER-2 negative, Ki-67 10%.   PLAN:  1.Stage IV colon cancer to the right axillary lymph node: -She denies any immunotherapy related side effects. -We reviewed her labs.  TSH improved to 2.47.  LFTs are normal. -She will proceed with her next cycle today.  We discussed CEA from 08/06/2020, improved to 9.9. -RTC 3 weeks for follow-up.  2.  Right breast invasive lobular carcinoma, grade 2: -Continue anastrozole.  She is tolerating well.  3. Normocytic anemia: -Hemoglobin is 13.3.  No parenteral iron therapy needed.  4. Bladder cancer: -She has intermittent hematuria for several years and underwent cystoscopy at [facility].  5.  Elevated creatinine: -Creatinine improved to 1.43 today.  Encouraged hydration.   Orders placed this encounter:  No orders of the defined types were placed in this encounter.    Derek Jack, MD [place] [phone]   I, Milinda Antis, am acting as a scribe for Dr. Sanda Linger.  I, Derek Jack MD, have reviewed the above documentation for accuracy and completeness, and I agree with the above.

## 2020-08-27 NOTE — Patient Instructions (Signed)
[facility] Discharge Instructions for Patients Receiving Chemotherapy  Today you received the following chemotherapy agents   To help prevent nausea and vomiting after your treatment, we encourage you to take your nausea medication   If you develop nausea and vomiting that is not controlled by your nausea medication, call the clinic.   BELOW ARE SYMPTOMS THAT SHOULD BE REPORTED IMMEDIATELY:  *FEVER GREATER THAN 100.5 F  *CHILLS WITH OR WITHOUT FEVER  NAUSEA AND VOMITING THAT IS NOT CONTROLLED WITH YOUR NAUSEA MEDICATION  *UNUSUAL SHORTNESS OF BREATH  *UNUSUAL BRUISING OR BLEEDING  TENDERNESS IN MOUTH AND THROAT WITH OR WITHOUT PRESENCE OF ULCERS  *URINARY PROBLEMS  *BOWEL PROBLEMS  UNUSUAL RASH Items with * indicate a potential emergency and should be followed up as soon as possible.  Feel free to call the clinic should you have any questions or concerns. The clinic phone number is [phone].  Please show the CHEMO ALERT CARD at check-in to the Emergency Department and triage nurse.   

## 2020-08-27 NOTE — Patient Instructions (Signed)
[place] at [facility] Discharge Instructions  You were seen today by Dr. Delton Coombes. He went over your recent results. You received your treatment today. Apply moisturizing lotion on your skin to prevent it from cracking and peeling. Dr. Delton Coombes will see you back in 3 weeks for labs and follow up.   Thank you for choosing [place] at [facility] to provide your oncology and hematology care.  To afford each patient quality time with our provider, please arrive at least 15 minutes before your scheduled appointment time.   If you have a lab appointment with the [facility] please come in thru the Main Entrance and check in at the main information desk  You need to re-schedule your appointment should you arrive 10 or more minutes late.  We strive to give you quality time with our providers, and arriving late affects you and other patients whose appointments are after yours.  Also, if you no show three or more times for appointments you may be dismissed from the clinic at the providers discretion.     Again, thank you for choosing [facility].  Our hope is that these requests will decrease the amount of time that you wait before being seen by our physicians.       _____________________________________________________________  Should you have questions after your visit to [facility], please contact our office at [phone] between the hours of 8:00 a.m. and 4:30 p.m.  Voicemails left after 4:00 p.m. will not be returned until the following business day.  For prescription refill requests, have your pharmacy contact our office and allow 72 hours.    Cancer Center Support Programs:   > Cancer Support Group  2nd Tuesday of the month 1pm-2pm, Journey Room

## 2020-08-27 NOTE — Patient Instructions (Signed)
[facility] at [facility] Discharge Instructions  Labs drawn from portacath   Thank you for choosing [facility] at [facility] to provide your oncology and hematology care.  To afford each patient quality time with our provider, please arrive at least 15 minutes before your scheduled appointment time.   If you have a lab appointment with the Cancer Center please come in thru the Main Entrance and check in at the main information desk.  You need to re-schedule your appointment should you arrive 10 or more minutes late.  We strive to give you quality time with our providers, and arriving late affects you and other patients whose appointments are after yours.  Also, if you no show three or more times for appointments you may be dismissed from the clinic at the providers discretion.     Again, thank you for choosing [facility].  Our hope is that these requests will decrease the amount of time that you wait before being seen by our physicians.       _____________________________________________________________  Should you have questions after your visit to [facility], please contact our office at [phone] and follow the prompts.  Our office hours are 8:00 a.m. and 4:30 p.m. Monday - Friday.  Please note that voicemails left after 4:00 p.m. may not be returned until the following business day.  We are closed weekends and major holidays.  You do have access to a nurse 24-7, just call the main number to the clinic [phone] and do not press any options, hold on the line and a nurse will answer the phone.    For prescription refill requests, have your pharmacy contact our office and allow 72 hours.    Due to Covid, you will need to wear a mask upon entering the hospital. If you do not have a mask, a mask will be given to you at the Main Entrance upon arrival. For doctor visits, patients may have 1 support person age 18 or  older with them. For treatment visits, patients can not have anyone with them due to social distancing guidelines and our immunocompromised population.     

## 2020-08-27 NOTE — Progress Notes (Signed)
Patient presents today for treatment and follow up visit with Dr. Delton Coombes. Labs pending. Blood pressure elevated on arrival. Patient denies headache, blurred vision and dizziness. Patient states she took her blood pressure medication 3 hours late today per patient's words. MAR reviewed. Patient denies any changes since her last treatment.   Verbal order received from Dr. Delton Coombes. Add TSH to labs downstairs. Called the lab and TSH added per Lorette Ang in the lab.   Per D.Khashchuk/ Dr. Delton Coombes patient is good for treatment.   Treatment given today per MD orders. Tolerated infusion without adverse affects. Vital signs stable. No complaints at this time. Discharged from clinic ambulatory in stable condition. Alert and oriented x 3. F/U with [facility] as scheduled.

## 2020-08-28 LAB — CEA: CEA: 11.4 ng/mL — ABNORMAL HIGH (ref 0.0–4.7)

## 2020-09-17 ENCOUNTER — Inpatient Hospital Stay (HOSPITAL_BASED_OUTPATIENT_CLINIC_OR_DEPARTMENT_OTHER): Payer: Medicare Other | Admitting: Hematology

## 2020-09-17 ENCOUNTER — Inpatient Hospital Stay (HOSPITAL_COMMUNITY): Payer: Medicare Other

## 2020-09-17 ENCOUNTER — Other Ambulatory Visit: Payer: Self-pay

## 2020-09-17 VITALS — BP 150/83 | HR 58 | Temp 97.3°F | Resp 18 | Wt 162.7 lb

## 2020-09-17 VITALS — BP 156/68 | HR 51 | Temp 96.9°F | Resp 18

## 2020-09-17 DIAGNOSIS — C50911 Malignant neoplasm of unspecified site of right female breast: Secondary | ICD-10-CM

## 2020-09-17 DIAGNOSIS — C182 Malignant neoplasm of ascending colon: Secondary | ICD-10-CM

## 2020-09-17 LAB — COMPREHENSIVE METABOLIC PANEL
ALT: 16 U/L (ref 0–44)
AST: 20 U/L (ref 15–41)
Albumin: 4 g/dL (ref 3.5–5.0)
Alkaline Phosphatase: 71 U/L (ref 38–126)
Anion gap: 10 (ref 5–15)
BUN: 14 mg/dL (ref 8–23)
CO2: 26 mmol/L (ref 22–32)
Calcium: 9.7 mg/dL (ref 8.9–10.3)
Chloride: 105 mmol/L (ref 98–111)
Creatinine, Ser: 1.45 mg/dL — ABNORMAL HIGH (ref 0.44–1.00)
GFR, Estimated: 36 mL/min — ABNORMAL LOW (ref >60)
Glucose, Bld: 114 mg/dL — ABNORMAL HIGH (ref 70–99)
Potassium: 4 mmol/L (ref 3.5–5.1)
Sodium: 141 mmol/L (ref 135–145)
Total Bilirubin: 0.9 mg/dL (ref 0.3–1.2)
Total Protein: 6.9 g/dL (ref 6.5–8.1)

## 2020-09-17 LAB — CBC WITH DIFFERENTIAL/PLATELET
Abs Immature Granulocytes: 0.02 10*3/uL (ref 0.00–0.07)
Basophils Absolute: 0.1 10*3/uL (ref 0.0–0.1)
Basophils Relative: 1 %
Eosinophils Absolute: 0.2 10*3/uL (ref 0.0–0.5)
Eosinophils Relative: 4 %
HCT: 44.9 % (ref 36.0–46.0)
Hemoglobin: 14.1 g/dL (ref 12.0–15.0)
Immature Granulocytes: 0 %
Lymphocytes Relative: 14 %
Lymphs Abs: 0.8 10*3/uL (ref 0.7–4.0)
MCH: 29.6 pg (ref 26.0–34.0)
MCHC: 31.4 g/dL (ref 30.0–36.0)
MCV: 94.1 fL (ref 80.0–100.0)
Monocytes Absolute: 0.4 10*3/uL (ref 0.1–1.0)
Monocytes Relative: 7 %
Neutro Abs: 4.4 10*3/uL (ref 1.7–7.7)
Neutrophils Relative %: 74 %
Platelets: 175 10*3/uL (ref 150–400)
RBC: 4.77 MIL/uL (ref 3.87–5.11)
RDW: 14.2 % (ref 11.5–15.5)
WBC: 5.9 10*3/uL (ref 4.0–10.5)
nRBC: 0 % (ref 0.0–0.2)

## 2020-09-17 LAB — TSH: TSH: 0.882 u[IU]/mL (ref 0.350–4.500)

## 2020-09-17 MED ORDER — SODIUM CHLORIDE 0.9% FLUSH
10.0000 mL | INTRAVENOUS | Status: DC | PRN
  Administered 2020-09-17: 10 mL

## 2020-09-17 MED ORDER — SODIUM CHLORIDE 0.9 % IV SOLN
200.0000 mg | Freq: Once | INTRAVENOUS | Status: AC
  Administered 2020-09-17: 200 mg via INTRAVENOUS
  Filled 2020-09-17: qty 8

## 2020-09-17 MED ORDER — SODIUM CHLORIDE 0.9 % IV SOLN: Freq: Once | INTRAVENOUS | Status: AC

## 2020-09-17 MED ORDER — HEPARIN SOD (PORK) LOCK FLUSH 100 UNIT/ML IV SOLN
500.0000 [IU] | Freq: Once | INTRAVENOUS | Status: AC | PRN
  Administered 2020-09-17: 500 [IU]

## 2020-09-17 NOTE — Progress Notes (Signed)
[place], [place] [address]   CLINIC:  Medical Oncology/Hematology  PCP:  Antony Contras, MD [address] / [place] [address] [phone]   REASON FOR VISIT:  Follow-up for stage I right breast cancer and colon cancer  PRIOR THERAPY:  1. Robotic proximal colectomy on 10/11/2019. 2. Right breast lumpectomy and SLNB on 04/02/2020.  NGS Results: MSI--high, Foundation 1 not sent  CURRENT THERAPY: Keytruda every 3 weeks  BRIEF ONCOLOGIC HISTORY:  Oncology History  Cancer of ascending colon s/p robotic proximal colectomy 10/11/2019  10/11/2019 Initial Diagnosis   Cancer of ascending colon s/p robotic proximal colectomy 10/11/2019   10/23/2019 Cancer Staging   Staging form: Colon and Rectum, AJCC 8th Edition - Clinical stage from 10/23/2019: Stage IVA (cT3, cN1c, cM1a) - Signed by Derek Jack, MD on 12/14/2019   05/14/2020 -  Chemotherapy   The patient had pembrolizumab (KEYTRUDA) 200 mg in sodium chloride 0.9 % 50 mL chemo infusion, 200 mg, Intravenous, Once, 6 of 10 cycles Administration: 200 mg (05/14/2020), 200 mg (06/25/2020), 200 mg (07/16/2020), 200 mg (06/04/2020), 200 mg (08/06/2020), 200 mg (08/27/2020)  for chemotherapy treatment.    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 [facility])   04/23/2020 Cancer Staging   Staging form: Breast, AJCC 8th Edition - Clinical stage from 04/23/2020: Stage IA (cT1c, cN0(sn), cM0, G2, ER+, PR+, HER2-) - Signed by Derek Jack, MD on 04/23/2020     CANCER STAGING: Cancer Staging Cancer of ascending colon s/p robotic proximal colectomy 10/11/2019 Staging form: Colon and Rectum, AJCC 8th Edition - Clinical stage from 10/23/2019: Stage IVA (cT3, cN1c, cM1a) - Signed by Derek Jack, MD on 12/14/2019  Malignant neoplasm of right female breast [facility]) Staging form: Breast, AJCC  8th Edition - Clinical stage from 04/23/2020: Stage IA (cT1c, cN0(sn), cM0, G2, ER+, PR+, HER2-) - Signed by Derek Jack, MD on 04/23/2020   INTERVAL HISTORY:  Ms. MARELY APGAR, a 79 y.o. female, returns for routine follow-up and consideration for next cycle of chemotherapy. Gertrude was last seen on 08/27/2020.  Due for cycle #7 of Keytruda today.   Overall, she tells me she has been feeling pretty well. She tolerated the previous treatment well and denies having any new aches or pains, rashes, SOB or N/V/D. She reports having some right-sided chest pain occasionally.  Overall, she feels ready for next cycle of chemo today.    REVIEW OF SYSTEMS:  Review of Systems  Constitutional: Positive for appetite change (50%).  Respiratory: Negative for shortness of breath.   Cardiovascular: Positive for chest pain (R side CP occasional).  Gastrointestinal: Negative for diarrhea, nausea and vomiting.  Musculoskeletal: Negative for arthralgias.  Skin: Negative for rash.  All other systems reviewed and are negative.   PAST MEDICAL/SURGICAL HISTORY:  Past Medical History:  Diagnosis Date  . Abdominal aortic aneurysm ([place])   . Acid reflux   . Anemia   . Arthritis    knees , R shoulder - tx /w injection - 11/2014  . Basal cell carcinoma (BCC) of dorsum of nose 2010   Resolved  . Cancer ([place])    basal cell on nose  . Colon cancer ([place])   . Coronary artery disease   . Hypercholesteremia   . Hypertension   . Hypothyroidism   . Lt Acute pyelonephritis 09/11/2018  . MI, old 10  . Peripheral arterial  disease ([place])    hhigh-grade ostial bilateral calcified iliac stenosis with claudication  . Tobacco abuse   . Vertigo    when lays on left side.   Past Surgical History:  Procedure Laterality Date  . BREAST LUMPECTOMY WITH RADIOACTIVE SEED AND SENTINEL LYMPH NODE BIOPSY Bilateral 04/02/2020   Procedure: BILATERAL BREAST LUMPECTOMY WITH RADIOACTIVE SEED AND RIGHT SENTINEL LYMPH NODE  BIOPSY AND RIGHT TARGETED AXILLARY LYMPH NODE BIOPSY;  Surgeon: Erroll Luna, MD;  Location: [facility];  Service: General;  Laterality: Bilateral;  . CARDIAC CATHETERIZATION  5 stents  . CARDIAC CATHETERIZATION N/A 01/20/2016   Procedure: Left Heart Cath and Coronary Angiography;  Surgeon: Peter M Martinique, MD;  Location: [facility] [facility];  Service: Cardiovascular;  Laterality: N/A;  . CATARACT EXTRACTION W/PHACO Left 05/24/2014   Procedure: CATARACT EXTRACTION PHACO AND INTRAOCULAR LENS PLACEMENT (IOC);  Surgeon: Tonny Branch, MD;  Location: [facility];  Service: Ophthalmology;  Laterality: Left;  CDE:  9.30  . CATARACT EXTRACTION W/PHACO Right 06/18/2014   Procedure: CATARACT EXTRACTION PHACO AND INTRAOCULAR LENS PLACEMENT RIGHT EYE CDE=10.84;  Surgeon: Tonny Branch, MD;  Location: [facility];  Service: Ophthalmology;  Laterality: Right;  . CORONARY ARTERY BYPASS GRAFT N/A 01/24/2016   Procedure: CORONARY ARTERY BYPASS GRAFTING (CABG);  Surgeon: Gaye Pollack, MD;  Location: [facility];  Service: Open Heart Surgery;  Laterality: N/A;  . CORONARY STENT PLACEMENT    . CORONARY STENT PLACEMENT  03/06/15   CFX DES  . CYSTOSCOPY/URETEROSCOPY/HOLMIUM LASER/STENT PLACEMENT Left 09/12/2018   Procedure: CYSTOSCOPY/URETEROSCOPY/STENT PLACEMENT;  Surgeon: Ceasar Mons, MD;  Location: [facility];  Service: Urology;  Laterality: Left;  . EYE SURGERY    . LEFT HEART CATH AND CORS/GRAFTS ANGIOGRAPHY N/A 09/12/2019   Procedure: LEFT HEART CATH AND CORS/GRAFTS ANGIOGRAPHY;  Surgeon: Troy Sine, MD;  Location: [place];  Service: Cardiovascular;  Laterality: N/A;  . LEFT HEART CATHETERIZATION WITH CORONARY ANGIOGRAM N/A 03/06/2015   Procedure: LEFT HEART CATHETERIZATION WITH CORONARY ANGIOGRAM;  Surgeon: Lorretta Harp, MD;  Location: [facility] C[facility];  Service: Cardiovascular;  Laterality: N/A;  . PARTIAL KNEE ARTHROPLASTY Right 02/04/2015   Procedure: UNICOMPARTMENTAL KNEE;  Surgeon: Dorna Leitz, MD;  Location: [facility];  Service: Orthopedics;  Laterality: Right;  . PERIPHERAL VASCULAR CATHETERIZATION Bilateral 05/13/2015   Procedure: Lower Extremity Angiography;  Surgeon: Lorretta Harp, MD;  Location: [place] [facility];  Service: Cardiovascular;  Laterality: Bilateral;  . PERIPHERAL VASCULAR CATHETERIZATION N/A 05/13/2015   Procedure: Abdominal Aortogram;  Surgeon: Lorretta Harp, MD;  Location: [facility];  Service: Cardiovascular;  Laterality: N/A;  . PERIPHERAL VASCULAR CATHETERIZATION Bilateral 06/27/2015   Procedure: Peripheral Vascular Intervention;  Surgeon: Lorretta Harp, MD;  Location: [place];  Service: Cardiovascular;  Laterality: Bilateral;  ILIACS  . PERIPHERAL VASCULAR CATHETERIZATION Bilateral 06/27/2015   Procedure: Peripheral Vascular Atherectomy;  Surgeon: Lorretta Harp, MD;  Location: [place] [facility];  Service: Cardiovascular;  Laterality: Bilateral;  . PORTACATH PLACEMENT Left 05/24/2020   Procedure: INSERTION PORT-A-CATH;  Surgeon: Aviva Signs, MD;  Location: [facility];  Service: General;  Laterality: Left;  . TEE WITHOUT CARDIOVERSION N/A 01/24/2016   Procedure: TRANSESOPHAGEAL ECHOCARDIOGRAM (TEE);  Surgeon: Gaye Pollack, MD;  Location: [facility];  Service: Open Heart Surgery;  Laterality: N/A;  . TONSILLECTOMY     age 89  . TUBAL LIGATION      SOCIAL HISTORY:  Social History   Socioeconomic History  .  Marital status: Married    Spouse name: [name]  . Number of children: 4  . Years of education: Not on file  . Highest education level: Not on file  Occupational History  . Occupation: retired    Comment: worked as Presenter, broadcasting for [employer]  Tobacco Use  . Smoking status: Former Smoker    Packs/day: 1.50    Years: 42.00    Pack years: 63.00    Types: Cigarettes    Quit date: 05/19/1999    Years since quitting: 21.3  . Smokeless tobacco: Never Used  Vaping Use  . Vaping Use: Never used  Substance and Sexual  Activity  . Alcohol use: No  . Drug use: No  . Sexual activity: Yes    Birth control/protection: Surgical  Other Topics Concern  . Not on file  Social History Narrative  . Not on file   Social Determinants of Health   Financial Resource Strain:   . Difficulty of Paying Living Expenses: Not on file  Food Insecurity:   . Worried About Charity fundraiser in the Last Year: Not on file  . Ran Out of Food in the Last Year: Not on file  Transportation Needs:   . Lack of Transportation (Medical): Not on file  . Lack of Transportation (Non-Medical): Not on file  Physical Activity:   . Days of Exercise per Week: Not on file  . Minutes of Exercise per Session: Not on file  Stress:   . Feeling of Stress : Not on file  Social Connections:   . Frequency of Communication with Friends and Family: Not on file  . Frequency of Social Gatherings with Friends and Family: Not on file  . Attends Religious Services: Not on file  . Active Member of Clubs or Organizations: Not on file  . Attends Archivist Meetings: Not on file  . Marital Status: Not on file  Intimate Partner Violence:   . Fear of Current or Ex-Partner: Not on file  . Emotionally Abused: Not on file  . Physically Abused: Not on file  . Sexually Abused: Not on file    FAMILY HISTORY:  Family History  Problem Relation Age of Onset  . Ovarian cancer Mother 73  . Cancer Father 69       unsure of which kind, "it was in his glands"  . Hypertension Maternal Grandmother   . Stroke Maternal Grandfather   . Hypertension Son   . Brain cancer Sister   . Hypertension Daughter   . Heart attack Neg Hx   . Colon cancer Neg Hx   . Esophageal cancer Neg Hx   . Inflammatory bowel disease Neg Hx   . Liver disease Neg Hx   . Pancreatic cancer Neg Hx   . Rectal cancer Neg Hx   . Stomach cancer Neg Hx     CURRENT MEDICATIONS:  Current Outpatient Medications  Medication Sig Dispense Refill  . anastrozole (ARIMIDEX) 1 MG  tablet Take 1 tablet (1 mg total) by mouth daily. 30 tablet 6  . aspirin EC 81 MG tablet Take 81 mg by mouth every evening.     Marland Kitchen atorvastatin (LIPITOR) 40 MG tablet TAKE (1) TABLET BY MOUTH ONCE DAILY. (Patient taking differently: Take 40 mg by mouth daily. ) 90 tablet 3  . clopidogrel (PLAVIX) 75 MG tablet TAKE (1) TABLET BY MOUTH ONCE DAILY. (Patient taking differently: Take 75 mg by mouth daily. ) 90 tablet 3  . esomeprazole (NEXIUM) 20  MG capsule Take 20 mg by mouth daily.     Marland Kitchen levothyroxine (SYNTHROID, LEVOTHROID) 50 MCG tablet Take 50 mcg by mouth daily before breakfast.    . metoprolol tartrate (LOPRESSOR) 25 MG tablet Take 1 tablet (25 mg total) by mouth 2 (two) times daily. 180 tablet 3  . Pembrolizumab (KEYTRUDA IV) Inject 200 mg into the vein every 21 ( twenty-one) days.     . traMADol (ULTRAM) 50 MG tablet Take 50 mg by mouth every 8 (eight) hours as needed for moderate pain.     Marland Kitchen lidocaine-prilocaine (EMLA) cream Apply to affected area once (Patient not taking: Reported on 09/17/2020) 30 g 3  . nitroGLYCERIN (NITROSTAT) 0.4 MG SL tablet Place 1 tablet (0.4 mg total) under the tongue every 5 (five) minutes as needed for chest pain. (Patient not taking: Reported on 09/17/2020) 25 tablet prn  . prochlorperazine (COMPAZINE) 10 MG tablet Take 1 tablet (10 mg total) by mouth every 6 (six) hours as needed (Nausea or vomiting). (Patient not taking: Reported on 09/17/2020) 30 tablet 1   No current facility-administered medications for this visit.    ALLERGIES:  Allergies  Allergen Reactions  . Crab [Shellfish Allergy] Nausea And Vomiting    Throws up violently  . Other Nausea And Vomiting    Shrimp    PHYSICAL EXAM:  Performance status (ECOG): 1 - Symptomatic but completely ambulatory  Vitals:   09/17/20 1258  BP: (!) 150/83  Pulse: (!) 58  Resp: 18  Temp: (!) 97.3 F (36.3 C)  SpO2: 93%   Wt Readings from Last 3 Encounters:  09/17/20 162 lb 11.2 oz (73.8 kg)  08/27/20  163 lb 8 oz (74.2 kg)  08/06/20 158 lb 6.4 oz (71.8 kg)   Physical Exam Vitals reviewed.  Constitutional:      Appearance: Normal appearance.  Cardiovascular:     Rate and Rhythm: Normal rate and regular rhythm.     Pulses: Normal pulses.     Heart sounds: Normal heart sounds.  Pulmonary:     Effort: Pulmonary effort is normal.     Breath sounds: Normal breath sounds.  Chest:     Chest wall: Tenderness (chest wall tenderness on R sternal line) present.     Comments: Port-a-Cath in R chest Abdominal:     Palpations: Abdomen is soft. There is no hepatomegaly, splenomegaly or mass.     Tenderness: There is no abdominal tenderness.     Hernia: No hernia is present.  Lymphadenopathy:     Upper Body:     Right upper body: Axillary adenopathy (sub-cm LN) present.     Left upper body: No axillary or pectoral adenopathy.  Neurological:     General: No focal deficit present.     Mental Status: She is alert and oriented to person, place, and time.  Psychiatric:        Mood and Affect: Mood normal.        Behavior: Behavior normal.     LABORATORY DATA:  I have reviewed the labs as listed.  CBC Latest Ref Rng & Units 09/17/2020 08/27/2020 08/06/2020  WBC 4.0 - 10.5 K/uL 5.9 5.7 5.2  Hemoglobin 12.0 - 15.0 g/dL 14.1 13.3 14.2  Hematocrit 36 - 46 % 44.9 43.1 45.3  Platelets 150 - 400 K/uL 175 159 171   CMP Latest Ref Rng & Units 09/17/2020 08/27/2020 08/06/2020  Glucose 70 - 99 mg/dL 114(H) 111(H) 108(H)  BUN 8 - 23 mg/dL _0 Creatinine  0.44 - 1.00 mg/dL 1.45(H) 1.43(H) 1.78(H)  Sodium 135 - 145 mmol/L 141 140 141  Potassium 3.5 - 5.1 mmol/L 4.0 4.1 3.7  Chloride 98 - 111 mmol/L 105 104 106  CO2 22 - 32 mmol/L _0 Calcium 8.9 - 10.3 mg/dL 9.7 9.2 9.5  Total Protein 6.5 - 8.1 g/dL 6.9 6.2(L) 6.7  Total Bilirubin 0.3 - 1.2 mg/dL 0.9 0.7 0.6  Alkaline Phos 38 - 126 U/L 71 72 77  AST 15 - 41 U/L _1 ALT 0 - 44 U/L _2 Lab Results  Component Value Date    CEA1 11.4 (H) 08/27/2020   CEA1 9.9 (H) 08/06/2020   CEA1 11.5 (H) 07/16/2020    DIAGNOSTIC IMAGING:  I have independently reviewed the scans and discussed with the patient. No results found.   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 Right hemicolectomy on 10/11/2019, grade 3 adenocarcinoma, negative margins, positive LVSI, 2 tumor deposits, 0/15 lymph nodes positive, PT3PN1C. -MMR with loss of nuclear expression. MSI-high. MLH1 hyper methylation present. -PET scan in December 2020 showed lymph node in the right axillary region. Biopsy consistent with metastatic colon cancer. -Last CEA was 10.7 on 11/22/2019. -Right axillary lymph node excision on 04/02/2020 consistent with metastatic carcinoma from colon cancer. -Pembrolizumab started on 05/14/2020. -PET scan on 08/05/2020 shows complete resolution of lymphadenopathy. No evidence of new areas of uptake. Mild vague residual hypermetabolism in the right axilla without soft tissue mass.  2.Stage I (PT1CPN0) right Breast, Grade 2 Invasive Lobular Carcinoma: -MRI of the breast on 12/25/2019 showed 1 cm mass behind the right areola with a suspicious mass in the left breast. -Right breast lumpectomy on 04/02/2020 shows invasive lobular carcinoma, grade 2, 1.2 cm. Resection margins are negative. Negative LVSI. 2 sentinel lymph nodes were negative for carcinoma. ER/PR 100% positive, HER-2 negative, Ki-67 10%.   PLAN:  1.Stage IV colon cancer to the right axillary lymph node: -She does not report any immunotherapy related side effects. -Labs today shows normal CBC and LFTs.  TSH is 2.42. -Last CEA was 11.4, up from 9.9. -Physical examination reveals very small right axillary lymph node.  She will proceed with Keytruda today.  She will be seen back in 3 weeks for follow-up.  2.Right breast invasive lobular carcinoma, grade  2: -Continue anastrozole.  No adverse effects.  3. Normocytic anemia: -Hemoglobin is 14.1.  No parenteral iron therapy needed.  4. Bladder cancer: -She had intermittent hematuria for several years and underwent cystoscopy at [facility].  5. Elevated creatinine: -Creatinine improved to 1.45.   Orders placed this encounter:  No orders of the defined types were placed in this encounter.    Derek Jack, MD [place] [phone]   I, Milinda Antis, am acting as a scribe for Dr. Sanda Linger.  I, Derek Jack MD, have reviewed the above documentation for accuracy and completeness, and I agree with the above.

## 2020-09-17 NOTE — Patient Instructions (Signed)
[facility] at [facility] Discharge Instructions  You were seen today by Dr. Katragadda. He went over your recent results. You received your treatment today. Dr. Katragadda will see you back in 3 weeks for labs and follow up.   Thank you for choosing [facility] at [facility] to provide your oncology and hematology care.  To afford each patient quality time with our provider, please arrive at least 15 minutes before your scheduled appointment time.   If you have a lab appointment with the Cancer Center please come in thru the Main Entrance and check in at the main information desk  You need to re-schedule your appointment should you arrive 10 or more minutes late.  We strive to give you quality time with our providers, and arriving late affects you and other patients whose appointments are after yours.  Also, if you no show three or more times for appointments you may be dismissed from the clinic at the providers discretion.     Again, thank you for choosing [facility].  Our hope is that these requests will decrease the amount of time that you wait before being seen by our physicians.       _____________________________________________________________  Should you have questions after your visit to  Cancer Center, please contact our office at [phone] between the hours of 8:00 a.m. and 4:30 p.m.  Voicemails left after 4:00 p.m. will not be returned until the following business day.  For prescription refill requests, have your pharmacy contact our office and allow 72 hours.    Cancer Center Support Programs:   > Cancer Support Group  2nd Tuesday of the month 1pm-2pm, Journey Room    

## 2020-09-17 NOTE — Patient Instructions (Signed)
[facility] Discharge Instructions for Patients Receiving Chemotherapy  Today you received the following chemotherapy agents   To help prevent nausea and vomiting after your treatment, we encourage you to take your nausea medication   If you develop nausea and vomiting that is not controlled by your nausea medication, call the clinic.   BELOW ARE SYMPTOMS THAT SHOULD BE REPORTED IMMEDIATELY:  *FEVER GREATER THAN 100.5 F  *CHILLS WITH OR WITHOUT FEVER  NAUSEA AND VOMITING THAT IS NOT CONTROLLED WITH YOUR NAUSEA MEDICATION  *UNUSUAL SHORTNESS OF BREATH  *UNUSUAL BRUISING OR BLEEDING  TENDERNESS IN MOUTH AND THROAT WITH OR WITHOUT PRESENCE OF ULCERS  *URINARY PROBLEMS  *BOWEL PROBLEMS  UNUSUAL RASH Items with * indicate a potential emergency and should be followed up as soon as possible.  Feel free to call the clinic should you have any questions or concerns. The clinic phone number is [phone].  Please show the CHEMO ALERT CARD at check-in to the Emergency Department and triage nurse.   

## 2020-09-17 NOTE — Progress Notes (Signed)
Patient seen by Dr. Delton Coombes. Labs within parameters for treatment . Creatinine 1.45 today. MAR reviewed and updated. Message received from [place] Dr. Delton Coombes to proceed with treatment once labs are resulted. Vital signs within parameters for treatment.   Treatment given today per MD orders. Tolerated infusion without adverse affects. Vital signs stable. No complaints at this time. Discharged from clinic ambulatory in stable condition. Alert and oriented x 3. F/U with [facility] as scheduled.

## 2020-09-19 LAB — CEA: CEA: 10.3 ng/mL — ABNORMAL HIGH (ref 0.0–4.7)

## 2020-10-08 ENCOUNTER — Ambulatory Visit (HOSPITAL_COMMUNITY): Payer: Medicare Other

## 2020-10-08 ENCOUNTER — Inpatient Hospital Stay (HOSPITAL_COMMUNITY): Payer: Medicare Other | Attending: Hematology | Admitting: Hematology

## 2020-10-08 ENCOUNTER — Inpatient Hospital Stay (HOSPITAL_COMMUNITY): Payer: Medicare Other

## 2020-10-08 DIAGNOSIS — Z5112 Encounter for antineoplastic immunotherapy: Secondary | ICD-10-CM | POA: Insufficient documentation

## 2020-10-08 DIAGNOSIS — C50911 Malignant neoplasm of unspecified site of right female breast: Secondary | ICD-10-CM | POA: Insufficient documentation

## 2020-10-11 ENCOUNTER — Ambulatory Visit (INDEPENDENT_AMBULATORY_CARE_PROVIDER_SITE_OTHER): Payer: Medicare Other | Admitting: Cardiology

## 2020-10-11 ENCOUNTER — Other Ambulatory Visit: Payer: Self-pay

## 2020-10-11 ENCOUNTER — Encounter: Payer: Self-pay | Admitting: Cardiology

## 2020-10-11 VITALS — BP 170/90 | HR 52 | Ht 63.0 in | Wt 165.0 lb

## 2020-10-11 DIAGNOSIS — I10 Essential (primary) hypertension: Secondary | ICD-10-CM | POA: Diagnosis not present

## 2020-10-11 DIAGNOSIS — I7409 Other arterial embolism and thrombosis of abdominal aorta: Secondary | ICD-10-CM

## 2020-10-11 DIAGNOSIS — I739 Peripheral vascular disease, unspecified: Secondary | ICD-10-CM | POA: Diagnosis not present

## 2020-10-11 DIAGNOSIS — M79606 Pain in leg, unspecified: Secondary | ICD-10-CM

## 2020-10-11 MED ORDER — NITROGLYCERIN 0.4 MG SL SUBL: 0.4000 mg | SUBLINGUAL_TABLET | SUBLINGUAL | 99 refills | Status: DC | PRN

## 2020-10-11 NOTE — Progress Notes (Signed)
Cardiology Office Note:    Date:  10/11/2020   ID:  Jessica Taylor, DOB [birth year], MRN [id number]  PCP:  Antony Contras, MD  [facility] Cardiologist:  Candee Furbish, MD  [facility] Electrophysiologist:  None   Referring MD: Antony Contras, MD     History of Present Illness:    Jessica Taylor is a 79 y.o. female here for the follow-up of coronary artery disease.  01/24/2016-CABG x3, LIMA to LAD SVG to ramus SVG to PDA.  PAD-bilateral iliac stenting.  2016 Dr. Gwenlyn Found  Echo 2017-EF 60% with grade 2 diastolic dysfunction.  Nuclear stress test 08/2019-low risk no ischemia.  Had colectomy.     Past Medical History:  Diagnosis Date  . Abdominal aortic aneurysm ([place])   . Acid reflux   . Anemia   . Arthritis    knees , R shoulder - tx /w injection - 11/2014  . Basal cell carcinoma (BCC) of dorsum of nose 2010   Resolved  . Cancer ([place])    basal cell on nose  . Colon cancer ([place])   . Coronary artery disease   . Hypercholesteremia   . Hypertension   . Hypothyroidism   . Lt Acute pyelonephritis 09/11/2018  . MI, old 71  . Peripheral arterial disease (HCC)    hhigh-grade ostial bilateral calcified iliac stenosis with claudication  . Tobacco abuse   . Vertigo    when lays on left side.    Past Surgical History:  Procedure Laterality Date  . BREAST LUMPECTOMY WITH RADIOACTIVE SEED AND SENTINEL LYMPH NODE BIOPSY Bilateral 04/02/2020   Procedure: BILATERAL BREAST LUMPECTOMY WITH RADIOACTIVE SEED AND RIGHT SENTINEL LYMPH NODE BIOPSY AND RIGHT TARGETED AXILLARY LYMPH NODE BIOPSY;  Surgeon: Erroll Luna, MD;  Location: [place];  Service: General;  Laterality: Bilateral;  . CARDIAC CATHETERIZATION  5 stents  . CARDIAC CATHETERIZATION N/A 01/20/2016   Procedure: Left Heart Cath and Coronary Angiography;  Surgeon: Peter M Martinique, MD;  Location: [place] CV [facility];  Service: Cardiovascular;  Laterality: N/A;  . CATARACT EXTRACTION W/PHACO Left 05/24/2014    Procedure: CATARACT EXTRACTION PHACO AND INTRAOCULAR LENS PLACEMENT (IOC);  Surgeon: Tonny Branch, MD;  Location: [facility];  Service: Ophthalmology;  Laterality: Left;  CDE:  9.30  . CATARACT EXTRACTION W/PHACO Right 06/18/2014   Procedure: CATARACT EXTRACTION PHACO AND INTRAOCULAR LENS PLACEMENT RIGHT EYE CDE=10.84;  Surgeon: Tonny Branch, MD;  Location: [facility];  Service: Ophthalmology;  Laterality: Right;  . CORONARY ARTERY BYPASS GRAFT N/A 01/24/2016   Procedure: CORONARY ARTERY BYPASS GRAFTING (CABG);  Surgeon: Gaye Pollack, MD;  Location: [place];  Service: Open Heart Surgery;  Laterality: N/A;  . CORONARY STENT PLACEMENT    . CORONARY STENT PLACEMENT  03/06/15   CFX DES  . CYSTOSCOPY/URETEROSCOPY/HOLMIUM LASER/STENT PLACEMENT Left 09/12/2018   Procedure: CYSTOSCOPY/URETEROSCOPY/STENT PLACEMENT;  Surgeon: Ceasar Mons, MD;  Location: [facility];  Service: Urology;  Laterality: Left;  . EYE SURGERY    . LEFT HEART CATH AND CORS/GRAFTS ANGIOGRAPHY N/A 09/12/2019   Procedure: LEFT HEART CATH AND CORS/GRAFTS ANGIOGRAPHY;  Surgeon: Troy Sine, MD;  Location: [place] [facility];  Service: Cardiovascular;  Laterality: N/A;  . LEFT HEART CATHETERIZATION WITH CORONARY ANGIOGRAM N/A 03/06/2015   Procedure: LEFT HEART CATHETERIZATION WITH CORONARY ANGIOGRAM;  Surgeon: Lorretta Harp, MD;  Location: [facility];  Service: Cardiovascular;  Laterality: N/A;  . PARTIAL KNEE ARTHROPLASTY Right 02/04/2015   Procedure: UNICOMPARTMENTAL KNEE;  Surgeon: Dorna Leitz,  MD;  Location: [place];  Service: Orthopedics;  Laterality: Right;  . PERIPHERAL VASCULAR CATHETERIZATION Bilateral 05/13/2015   Procedure: Lower Extremity Angiography;  Surgeon: Lorretta Harp, MD;  Location: [place] [facility];  Service: Cardiovascular;  Laterality: Bilateral;  . PERIPHERAL VASCULAR CATHETERIZATION N/A 05/13/2015   Procedure: Abdominal Aortogram;  Surgeon: Lorretta Harp, MD;  Location: [place] [facility];  Service:  Cardiovascular;  Laterality: N/A;  . PERIPHERAL VASCULAR CATHETERIZATION Bilateral 06/27/2015   Procedure: Peripheral Vascular Intervention;  Surgeon: Lorretta Harp, MD;  Location: [place] [facility];  Service: Cardiovascular;  Laterality: Bilateral;  ILIACS  . PERIPHERAL VASCULAR CATHETERIZATION Bilateral 06/27/2015   Procedure: Peripheral Vascular Atherectomy;  Surgeon: Lorretta Harp, MD;  Location: [place] [facility];  Service: Cardiovascular;  Laterality: Bilateral;  . PORTACATH PLACEMENT Left 05/24/2020   Procedure: INSERTION PORT-A-CATH;  Surgeon: Aviva Signs, MD;  Location: [facility];  Service: General;  Laterality: Left;  . TEE WITHOUT CARDIOVERSION N/A 01/24/2016   Procedure: TRANSESOPHAGEAL ECHOCARDIOGRAM (TEE);  Surgeon: Gaye Pollack, MD;  Location: [place];  Service: Open Heart Surgery;  Laterality: N/A;  . TONSILLECTOMY     age 65  . TUBAL LIGATION      Current Medications: Current Meds  Medication Sig  . anastrozole (ARIMIDEX) 1 MG tablet Take 1 tablet (1 mg total) by mouth daily.  Marland Kitchen aspirin EC 81 MG tablet Take 81 mg by mouth every evening.   Marland Kitchen atorvastatin (LIPITOR) 40 MG tablet TAKE (1) TABLET BY MOUTH ONCE DAILY.  Marland Kitchen clopidogrel (PLAVIX) 75 MG tablet TAKE (1) TABLET BY MOUTH ONCE DAILY.  Marland Kitchen esomeprazole (NEXIUM) 20 MG capsule Take 20 mg by mouth daily.   Marland Kitchen levothyroxine (SYNTHROID, LEVOTHROID) 50 MCG tablet Take 50 mcg by mouth daily before breakfast.  . lidocaine-prilocaine (EMLA) cream Apply to affected area once  . metoprolol tartrate (LOPRESSOR) 25 MG tablet Take 1 tablet (25 mg total) by mouth 2 (two) times daily.  . nitroGLYCERIN (NITROSTAT) 0.4 MG SL tablet Place 1 tablet (0.4 mg total) under the tongue every 5 (five) minutes as needed for chest pain.  . Pembrolizumab (KEYTRUDA IV) Inject 200 mg into the vein every 21 ( twenty-one) days.   . prochlorperazine (COMPAZINE) 10 MG tablet Take 1 tablet (10 mg total) by mouth every 6 (six) hours as needed (Nausea or vomiting).    . [DISCONTINUED] nitroGLYCERIN (NITROSTAT) 0.4 MG SL tablet Place 1 tablet (0.4 mg total) under the tongue every 5 (five) minutes as needed for chest pain.     Allergies:   Crab [shellfish allergy] and Other   Social History   Socioeconomic History  . Marital status: Married    Spouse name: [name]  . Number of children: 4  . Years of education: Not on file  . Highest education level: Not on file  Occupational History  . Occupation: retired    Comment: worked as Presenter, broadcasting for [employer]  Tobacco Use  . Smoking status: Former Smoker    Packs/day: 1.50    Years: 42.00    Pack years: 63.00    Types: Cigarettes    Quit date: 05/19/1999    Years since quitting: 21.4  . Smokeless tobacco: Never Used  Vaping Use  . Vaping Use: Never used  Substance and Sexual Activity  . Alcohol use: No  . Drug use: No  . Sexual activity: Yes    Birth control/protection: Surgical  Other Topics Concern  . Not on file  Social  History Narrative  . Not on file   Social Determinants of Health   Financial Resource Strain:   . Difficulty of Paying Living Expenses: Not on file  Food Insecurity:   . Worried About Charity fundraiser in the Last Year: Not on file  . Ran Out of Food in the Last Year: Not on file  Transportation Needs:   . Lack of Transportation (Medical): Not on file  . Lack of Transportation (Non-Medical): Not on file  Physical Activity:   . Days of Exercise per Week: Not on file  . Minutes of Exercise per Session: Not on file  Stress:   . Feeling of Stress : Not on file  Social Connections:   . Frequency of Communication with Friends and Family: Not on file  . Frequency of Social Gatherings with Friends and Family: Not on file  . Attends Religious Services: Not on file  . Active Member of Clubs or Organizations: Not on file  . Attends Archivist Meetings: Not on file  . Marital Status: Not on file     Family History: The patient's family history  includes Brain cancer in her sister; Cancer (age of onset: 86) in her father; Hypertension in her daughter, maternal grandmother, and son; Ovarian cancer (age of onset: 2) in her mother; Stroke in her maternal grandfather. There is no history of Heart attack, Colon cancer, Esophageal cancer, Inflammatory bowel disease, Liver disease, Pancreatic cancer, Rectal cancer, or Stomach cancer.  ROS:   Please see the history of present illness.    No fevers no chills no nausea no vomiting all other systems reviewed and are negative.  EKGs/Labs/Other Studies Reviewed:    The following studies were reviewed today:  Cardiac catheterization 09/12/2019:  Dist RCA lesion is 100% stenosed.  Prox RCA lesion is 40% stenosed.  Mid RCA lesion is 50% stenosed.  Mid RCA to Dist RCA lesion is 90% stenosed.  RV Branch-1 lesion is 50% stenosed.  RV Branch-2 lesion is 60% stenosed.  Previously placed Mid Cx to Dist Cx stent (unknown type) is widely patent.  Ramus lesion is 100% stenosed.  Ost LAD to Prox LAD lesion is 50% stenosed.  Prox LAD to Mid LAD lesion is 85% stenosed.  Mid LAD lesion is 80% stenosed.   Significant multivessel native CAD with previous stenting in the proximal to mid LAD with diffuse 50% narrowing in the proximal stent followed by an area of 70 to 85% stenosis extending into the proximal second stent with competitive filling of the LAD via the LIMA graft.    Total occlusion of the proximal ramus intermediate vessel at the site of prior stenting.  Widely patent mid left circumflex stent without significant additional stenoses in the circumflex vessel.  Bleeding irregular proximal to mid RCA with total occlusion of the RCA in the region of the acute margin at the site of prior distal stenting and diffuse 50 and 60% stenoses in a marginal branch.  Patent LIMA to mid LAD.  Patent SVG to the ramus intermediate vessel.  Patent SVG to the PDA.  Low normal global LV  function with EF estimate approximately 50%.  LVEDP 6 mmHg.  RECOMMENDATION: Patient has been maintained on long-term dual antiplatelet therapy with aspirin and Plavix.  All grafts are widely patent.  Medical therapy for concomitant native CAD.  Continue aggressive lipid-lowering therapy with target LDL less than 70.  Clearance will be given for the patient's future robotic colectomy but she will need to  hold Plavix for at least 5 days prior to the procedure.  Diagnostic Dominance: Right     EKG:  EKG is  ordered today.  The ekg ordered today demonstrates sinus bradycardia 52 left bundle branch block.  Prior, left bundle branch block sinus.  Recent Labs: 07/16/2020: Magnesium 1.9 09/17/2020: ALT 16; BUN 14; Creatinine, Ser 1.45; Hemoglobin 14.1; Platelets 175; Potassium 4.0; Sodium 141; TSH 0.882  Recent Lipid Panel    Component Value Date/Time   CHOL 188 07/13/2016 1049   TRIG 259 (H) 07/13/2016 1049   HDL 38 (L) 07/13/2016 1049   CHOLHDL 4.9 07/13/2016 1049   VLDL 52 (H) 07/13/2016 1049   LDLCALC 98 07/13/2016 1049     Risk Assessment/Calculations:       Physical Exam:    VS:  BP (!) 170/90   Pulse (!) 52   Ht 5\' 3"  (1.6 m)   Wt 165 lb (74.8 kg)   LMP  (LMP Unknown)   SpO2 95%   BMI 29.23 kg/m     Wt Readings from Last 3 Encounters:  10/11/20 165 lb (74.8 kg)  09/17/20 162 lb 11.2 oz (73.8 kg)  08/27/20 163 lb 8 oz (74.2 kg)     GEN:  Well nourished, well developed in no acute distress HEENT: Normal NECK: No JVD; No carotid bruits LYMPHATICS: No lymphadenopathy CARDIAC: RRR, no murmurs, rubs, gallops RESPIRATORY:  Clear to auscultation without rales, wheezing or rhonchi  ABDOMEN: Soft, non-tender, non-distended MUSCULOSKELETAL:  No edema; No deformity  SKIN: Warm and dry NEUROLOGIC:  Alert and oriented x 3 PSYCHIATRIC:  Normal affect   ASSESSMENT:    1. PAD (peripheral artery disease) ([place])   2. Aortoiliac occlusive disease (HCC)   3. Pain of lower  extremity, unspecified laterality   4. Essential hypertension    PLAN:    In order of problems listed above:  Coronary artery disease -Bypass surgery 2016.  Nuclear stress test 2020 overall low risk no significant ischemia. -Continue with aspirin Plavix high intensity statin. -Cardiac catheterization 2020 as above reviewed.  Patent grafts.  Hyperlipidemia -On atorvastatin 40 mg a day.  Goal LDL less than 70.  PVD -Bilateral iliac stenting from Dr. Gwenlyn Found.  2016.  Still having some mild leg discomfort.  I will check lower extremity vascular arterial ultrasound since it has been since 2016.  Chronic kidney disease stage IIIa -Creatinine 1.4 range.  Left bundle branch block -No higher symptoms such as syncope.  Sinus rhythm.  Prior colectomy.  Higher hematuria evaluated by Dr. Rosana Hoes in urology.  Nothing discovered.  Plavix and aspirin certainly exacerbated any bleeding.   Shared Decision Making/Informed Consent        Medication Adjustments/Labs and Tests Ordered: Current medicines are reviewed at length with the patient today.  Concerns regarding medicines are outlined above.  Orders Placed This Encounter  Procedures  . EKG 12-Lead  . VAS [place] LOWER EXTREMITY ARTERIAL DUPLEX  . VAS [place] ABI WITH/WO TBI   Meds ordered this encounter  Medications  . nitroGLYCERIN (NITROSTAT) 0.4 MG SL tablet    Sig: Place 1 tablet (0.4 mg total) under the tongue every 5 (five) minutes as needed for chest pain.    Dispense:  25 tablet    Refill:  prn    Patient Instructions  Medication Instructions:  The current medical regimen is effective;  continue present plan and medications.  *If you need a refill on your cardiac medications before your next appointment, please call your pharmacy*  Testing/Procedures: Your physician has requested that you have a lower extremity arterial exercise duplex. During this test, exercise and ultrasound are used to evaluate arterial blood flow in the legs.  Allow one hour for this exam. There are no restrictions or special instructions. This test is completed at our [facility] office.  Follow-Up: At [facility], you and your health needs are our priority.  As part of our continuing mission to provide you with exceptional heart care, we have created designated Provider Care Teams.  These Care Teams include your primary Cardiologist (physician) and Advanced Practice Providers (APPs -  Physician Assistants and Nurse Practitioners) who all work together to provide you with the care you need, when you need it.  We recommend signing up for the patient portal called "MyChart".  Sign up information is provided on this After Visit Summary.  MyChart is used to connect with patients for Virtual Visits (Telemedicine).  Patients are able to view lab/test results, encounter notes, upcoming appointments, etc.  Non-urgent messages can be sent to your provider as well.   To learn more about what you can do with MyChart, go to [email or web address].    Your next appointment:   12 month(s)  The format for your next appointment:   In Person  Provider:   Candee Furbish, MD  Thank you for choosing [facility]!!        Signed, Candee Furbish, MD  10/11/2020 3:28 PM    [place]

## 2020-10-11 NOTE — Patient Instructions (Signed)
Medication Instructions:  The current medical regimen is effective;  continue present plan and medications.  *If you need a refill on your cardiac medications before your next appointment, please call your pharmacy*  Testing/Procedures: Your physician has requested that you have a lower extremity arterial exercise duplex. During this test, exercise and ultrasound are used to evaluate arterial blood flow in the legs. Allow one hour for this exam. There are no restrictions or special instructions. This test is completed at our [facility] office.  Follow-Up: At [facility], you and your health needs are our priority.  As part of our continuing mission to provide you with exceptional heart care, we have created designated Provider Care Teams.  These Care Teams include your primary Cardiologist (physician) and Advanced Practice Providers (APPs -  Physician Assistants and Nurse Practitioners) who all work together to provide you with the care you need, when you need it.  We recommend signing up for the patient portal called "MyChart".  Sign up information is provided on this After Visit Summary.  MyChart is used to connect with patients for Virtual Visits (Telemedicine).  Patients are able to view lab/test results, encounter notes, upcoming appointments, etc.  Non-urgent messages can be sent to your provider as well.   To learn more about what you can do with MyChart, go to [email or web address].    Your next appointment:   12 month(s)  The format for your next appointment:   In Person  Provider:   Candee Furbish, MD  Thank you for choosing [facility]!!

## 2020-10-14 ENCOUNTER — Encounter (HOSPITAL_COMMUNITY): Payer: Self-pay

## 2020-10-14 ENCOUNTER — Inpatient Hospital Stay (HOSPITAL_COMMUNITY): Payer: Medicare Other

## 2020-10-14 ENCOUNTER — Other Ambulatory Visit: Payer: Self-pay

## 2020-10-14 VITALS — BP 181/90 | HR 53 | Temp 97.2°F | Resp 18

## 2020-10-14 DIAGNOSIS — C50911 Malignant neoplasm of unspecified site of right female breast: Secondary | ICD-10-CM | POA: Diagnosis not present

## 2020-10-14 DIAGNOSIS — C182 Malignant neoplasm of ascending colon: Secondary | ICD-10-CM

## 2020-10-14 DIAGNOSIS — Z5112 Encounter for antineoplastic immunotherapy: Secondary | ICD-10-CM | POA: Diagnosis present

## 2020-10-14 LAB — COMPREHENSIVE METABOLIC PANEL
ALT: 15 U/L (ref 0–44)
AST: 21 U/L (ref 15–41)
Albumin: 4 g/dL (ref 3.5–5.0)
Alkaline Phosphatase: 74 U/L (ref 38–126)
Anion gap: 7 (ref 5–15)
BUN: 14 mg/dL (ref 8–23)
CO2: 26 mmol/L (ref 22–32)
Calcium: 9.4 mg/dL (ref 8.9–10.3)
Chloride: 106 mmol/L (ref 98–111)
Creatinine, Ser: 1.34 mg/dL — ABNORMAL HIGH (ref 0.44–1.00)
GFR, Estimated: 40 mL/min — ABNORMAL LOW (ref >60)
Glucose, Bld: 97 mg/dL (ref 70–99)
Potassium: 4.1 mmol/L (ref 3.5–5.1)
Sodium: 139 mmol/L (ref 135–145)
Total Bilirubin: 0.5 mg/dL (ref 0.3–1.2)
Total Protein: 6.7 g/dL (ref 6.5–8.1)

## 2020-10-14 LAB — CBC WITH DIFFERENTIAL/PLATELET
Abs Immature Granulocytes: 0.02 10*3/uL (ref 0.00–0.07)
Basophils Absolute: 0.1 10*3/uL (ref 0.0–0.1)
Basophils Relative: 1 %
Eosinophils Absolute: 0.1 10*3/uL (ref 0.0–0.5)
Eosinophils Relative: 1 %
HCT: 44.2 % (ref 36.0–46.0)
Hemoglobin: 13.7 g/dL (ref 12.0–15.0)
Immature Granulocytes: 0 %
Lymphocytes Relative: 17 %
Lymphs Abs: 0.9 10*3/uL (ref 0.7–4.0)
MCH: 29.1 pg (ref 26.0–34.0)
MCHC: 31 g/dL (ref 30.0–36.0)
MCV: 93.8 fL (ref 80.0–100.0)
Monocytes Absolute: 0.5 10*3/uL (ref 0.1–1.0)
Monocytes Relative: 9 %
Neutro Abs: 3.9 10*3/uL (ref 1.7–7.7)
Neutrophils Relative %: 72 %
Platelets: 167 10*3/uL (ref 150–400)
RBC: 4.71 MIL/uL (ref 3.87–5.11)
RDW: 14.1 % (ref 11.5–15.5)
WBC: 5.5 10*3/uL (ref 4.0–10.5)
nRBC: 0 % (ref 0.0–0.2)

## 2020-10-14 MED ORDER — SODIUM CHLORIDE 0.9 % IV SOLN: Freq: Once | INTRAVENOUS | Status: AC

## 2020-10-14 MED ORDER — SODIUM CHLORIDE 0.9 % IV SOLN
200.0000 mg | Freq: Once | INTRAVENOUS | Status: AC
  Administered 2020-10-14: 200 mg via INTRAVENOUS
  Filled 2020-10-14: qty 8

## 2020-10-14 MED ORDER — SODIUM CHLORIDE 0.9% FLUSH
10.0000 mL | INTRAVENOUS | Status: DC | PRN
  Administered 2020-10-14: 10 mL

## 2020-10-14 MED ORDER — HEPARIN SOD (PORK) LOCK FLUSH 100 UNIT/ML IV SOLN
500.0000 [IU] | Freq: Once | INTRAVENOUS | Status: AC | PRN
  Administered 2020-10-14: 500 [IU]

## 2020-10-14 NOTE — Patient Instructions (Signed)
[facility] Discharge Instructions for Patients Receiving Chemotherapy  Today you received the following chemotherapy agents   To help prevent nausea and vomiting after your treatment, we encourage you to take your nausea medication   If you develop nausea and vomiting that is not controlled by your nausea medication, call the clinic.   BELOW ARE SYMPTOMS THAT SHOULD BE REPORTED IMMEDIATELY:  *FEVER GREATER THAN 100.5 F  *CHILLS WITH OR WITHOUT FEVER  NAUSEA AND VOMITING THAT IS NOT CONTROLLED WITH YOUR NAUSEA MEDICATION  *UNUSUAL SHORTNESS OF BREATH  *UNUSUAL BRUISING OR BLEEDING  TENDERNESS IN MOUTH AND THROAT WITH OR WITHOUT PRESENCE OF ULCERS  *URINARY PROBLEMS  *BOWEL PROBLEMS  UNUSUAL RASH Items with * indicate a potential emergency and should be followed up as soon as possible.  Feel free to call the clinic should you have any questions or concerns. The clinic phone number is [phone].  Please show the CHEMO ALERT CARD at check-in to the Emergency Department and triage nurse.   

## 2020-10-14 NOTE — Progress Notes (Signed)
Pt here for D1C8 of keytruda.  Pt does not have any complaints today.  Labs are back and WNL for treatment.  Okay to proceed with treatment.   Tolerated treatment well today without incidence.  Discharged in stable condition ambulatory.

## 2020-10-15 LAB — CEA: CEA: 9.7 ng/mL — ABNORMAL HIGH (ref 0.0–4.7)

## 2020-10-28 ENCOUNTER — Ambulatory Visit (HOSPITAL_COMMUNITY)
Admission: RE | Admit: 2020-10-28 | Discharge: 2020-10-28 | Disposition: A | Discharge status: Home / Self Care | Payer: Medicare Other | Source: Ambulatory Visit | Attending: Cardiology | Admitting: Cardiology

## 2020-10-28 ENCOUNTER — Ambulatory Visit (HOSPITAL_BASED_OUTPATIENT_CLINIC_OR_DEPARTMENT_OTHER)
Admission: RE | Admit: 2020-10-28 | Discharge: 2020-10-28 | Disposition: A | Discharge status: Home / Self Care | Payer: Medicare Other | Source: Ambulatory Visit | Attending: Cardiology | Admitting: Cardiology

## 2020-10-28 ENCOUNTER — Other Ambulatory Visit: Payer: Self-pay

## 2020-10-28 ENCOUNTER — Other Ambulatory Visit (HOSPITAL_COMMUNITY): Payer: Self-pay | Admitting: Cardiology

## 2020-10-28 DIAGNOSIS — Z95828 Presence of other vascular implants and grafts: Secondary | ICD-10-CM

## 2020-10-28 DIAGNOSIS — M79606 Pain in leg, unspecified: Secondary | ICD-10-CM | POA: Insufficient documentation

## 2020-10-28 DIAGNOSIS — I7409 Other arterial embolism and thrombosis of abdominal aorta: Secondary | ICD-10-CM

## 2020-10-28 DIAGNOSIS — I739 Peripheral vascular disease, unspecified: Secondary | ICD-10-CM | POA: Insufficient documentation

## 2020-10-28 DIAGNOSIS — I714 Abdominal aortic aneurysm, without rupture, unspecified: Secondary | ICD-10-CM

## 2020-11-04 NOTE — Progress Notes (Signed)
Jessica Taylor   [phone] [phone]   Clinic Follow up Note   Patient Care Team: Antony Contras, MD as PCP - General (Family Medicine) Jerline Pain, MD as PCP - Cardiology (Cardiology) Michael Boston, MD as Consulting Physician (General Surgery) Jerline Pain, MD as Consulting Physician (Cardiology) Mansouraty, Telford Nab., MD as Consulting Physician (Gastroenterology) Donetta Potts, RN as Oncology Nurse Navigator Derek Jack, MD as Consulting Physician (Medical Oncology)  Date of Service:  11/05/2020  CHIEF COMPLAINT:  Follow-up for stage I right breast cancer and colon cancer  SUMMARY OF ONCOLOGIC HISTORY: Oncology History  Cancer of ascending colon s/p robotic proximal colectomy 10/11/2019  10/11/2019 Initial Diagnosis   Cancer of ascending colon s/p robotic proximal colectomy 10/11/2019   10/23/2019 Cancer Staging   Staging form: Colon and Rectum, AJCC 8th Edition - Clinical stage from 10/23/2019: Stage IVA (cT3, cN1c, cM1a) - Signed by Derek Jack, MD on 12/14/2019   05/14/2020 -  Chemotherapy   The patient had pembrolizumab (KEYTRUDA) 200 mg in sodium chloride 0.9 % 50 mL chemo infusion, 200 mg, Intravenous, Once, 8 of 10 cycles Administration: 200 mg (05/14/2020), 200 mg (06/25/2020), 200 mg (07/16/2020), 200 mg (06/04/2020), 200 mg (08/06/2020), 200 mg (08/27/2020), 200 mg (09/17/2020), 200 mg (10/14/2020)  for chemotherapy treatment.    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 Robotic proximal colectomy on 10/11/2019. 2. Right breast lumpectomy and SLNB on 04/02/2020.  NGS Results: MSI--high,  Foundation 1 not sent  CURRENT THERAPY:  -Keytruda every 3 weeks starting 05/14/20 for colon cancer -Anastrozole 74m daily for breast cancer starting 04/2020  INTERVAL HISTORY:  Jessica COMUNALEis here for a follow up and treatment. She is under the care of Dr KDelton Coombesand is seeing me today in interim.  Patient is tolerating treatment very well, no significant fatigue, nausea, diarrhea, or other side effects.  She does notice intermittent skin itchiness, some time rash, she is not sure if is related to treatment.  She is also tolerating anastrozole well, no significant hot flashes, or worsening arthralgia.  She has good appetite and energy level, she has gained a few pounds lately.   All other systems were reviewed with the patient and are negative.  MEDICAL HISTORY:  Past Medical History:  Diagnosis Date   Abdominal aortic aneurysm (HCC)    Acid reflux    Anemia    Arthritis    knees , R shoulder - tx /w injection - 11/2014   Basal cell carcinoma (BCC) of dorsum of nose 2010   Resolved   Cancer ([place]    basal cell on nose   Colon cancer (HCC)    Coronary artery disease    Hypercholesteremia    Hypertension    Hypothyroidism    Lt Acute pyelonephritis 09/11/2018   MI, old 2000   Peripheral arterial disease ([place]    hhigh-grade ostial bilateral calcified iliac stenosis with claudication   Tobacco abuse    Vertigo    when lays on left side.    SURGICAL HISTORY: Past Surgical History:  Procedure Laterality Date   BREAST LUMPECTOMY WITH RADIOACTIVE SEED AND SENTINEL  LYMPH NODE BIOPSY Bilateral 04/02/2020   Procedure: BILATERAL BREAST LUMPECTOMY WITH RADIOACTIVE SEED AND RIGHT SENTINEL LYMPH NODE BIOPSY AND RIGHT TARGETED AXILLARY LYMPH NODE BIOPSY;  Surgeon: Erroll Luna, MD;  Location: [place];  Service: General;  Laterality: Bilateral;   CARDIAC CATHETERIZATION  5 stents   CARDIAC CATHETERIZATION N/A 01/20/2016   Procedure: Left Heart  Cath and Coronary Angiography;  Surgeon: Peter M Martinique, MD;  Location: [place] [facility];  Service: Cardiovascular;  Laterality: N/A;   CATARACT EXTRACTION W/PHACO Left 05/24/2014   Procedure: CATARACT EXTRACTION PHACO AND INTRAOCULAR LENS PLACEMENT (IOC);  Surgeon: Tonny Branch, MD;  [facility];  Service: Ophthalmology;  Laterality: Left;  CDE:  9.30   CATARACT EXTRACTION W/PHACO Right 06/18/2014   Procedure: CATARACT EXTRACTION PHACO AND INTRAOCULAR LENS PLACEMENT RIGHT EYE CDE=10.84;  Surgeon: Tonny Branch, MD;  Location: [facility];  Service: Ophthalmology;  Laterality: Right;   CORONARY ARTERY BYPASS GRAFT N/A 01/24/2016   Procedure: CORONARY ARTERY BYPASS GRAFTING (CABG);  Surgeon: Gaye Pollack, MD;  Location: [place];  Service: Open Heart Surgery;  Laterality: N/A;   CORONARY STENT PLACEMENT     CORONARY STENT PLACEMENT  03/06/15   CFX DES   CYSTOSCOPY/URETEROSCOPY/HOLMIUM LASER/STENT PLACEMENT Left 09/12/2018   Procedure: CYSTOSCOPY/URETEROSCOPY/STENT PLACEMENT;  Surgeon: Ceasar Mons, MD;  Location: [facility];  Service: Urology;  Laterality: Left;   EYE SURGERY     LEFT HEART CATH AND CORS/GRAFTS ANGIOGRAPHY N/A 09/12/2019   Procedure: LEFT HEART CATH AND CORS/GRAFTS ANGIOGRAPHY;  Surgeon: Troy Sine, MD;  Location: [place] [facility];  Service: Cardiovascular;  Laterality: N/A;   LEFT HEART CATHETERIZATION WITH CORONARY ANGIOGRAM N/A 03/06/2015   Procedure: LEFT HEART CATHETERIZATION WITH CORONARY ANGIOGRAM;  Surgeon: Lorretta Harp, MD;  Location: [facility];  Service: Cardiovascular;  Laterality: N/A;   PARTIAL KNEE ARTHROPLASTY Right 02/04/2015   Procedure: UNICOMPARTMENTAL KNEE;  Surgeon: Dorna Leitz, MD;  Location: [place];  Service: Orthopedics;  Laterality: Right;   PERIPHERAL VASCULAR CATHETERIZATION Bilateral 05/13/2015   Procedure: Lower Extremity Angiography;  Surgeon: Lorretta Harp, MD;  Location: [place] [facility];  Service: Cardiovascular;   Laterality: Bilateral;   PERIPHERAL VASCULAR CATHETERIZATION N/A 05/13/2015   Procedure: Abdominal Aortogram;  Surgeon: Lorretta Harp, MD;  Location: [place] [facility];  Service: Cardiovascular;  Laterality: N/A;   PERIPHERAL VASCULAR CATHETERIZATION Bilateral 06/27/2015   Procedure: Peripheral Vascular Intervention;  Surgeon: Lorretta Harp, MD;  Location: [place] [facility];  Service: Cardiovascular;  Laterality: Bilateral;  ILIACS   PERIPHERAL VASCULAR CATHETERIZATION Bilateral 06/27/2015   Procedure: Peripheral Vascular Atherectomy;  Surgeon: Lorretta Harp, MD;  Location: [place] [facility];  Service: Cardiovascular;  Laterality: Bilateral;   PORTACATH PLACEMENT Left 05/24/2020   Procedure: INSERTION PORT-A-CATH;  Surgeon: Aviva Signs, MD;  [facility];  Service: General;  Laterality: Left;   TEE WITHOUT CARDIOVERSION N/A 01/24/2016   Procedure: TRANSESOPHAGEAL ECHOCARDIOGRAM (TEE);  Surgeon: Gaye Pollack, MD;  Location: [place];  Service: Open Heart Surgery;  Laterality: N/A;   TONSILLECTOMY     age 73   TUBAL LIGATION      I have reviewed the social history and family history with the patient and they are unchanged from previous note.  ALLERGIES:  is allergic to crab [shellfish allergy] and other.  MEDICATIONS:  Current Outpatient Medications  Medication Sig Dispense Refill   anastrozole (ARIMIDEX) 1 MG tablet Take 1 tablet (1 mg total) by mouth  daily. 30 tablet 6   aspirin EC 81 MG tablet Take 81 mg by mouth every evening.      atorvastatin (LIPITOR) 40 MG tablet TAKE (1) TABLET BY MOUTH ONCE DAILY. 90 tablet 3   clopidogrel (PLAVIX) 75 MG tablet TAKE (1) TABLET BY MOUTH ONCE DAILY. 90 tablet 3   esomeprazole (NEXIUM) 20 MG capsule Take 20 mg by mouth daily.      levothyroxine (SYNTHROID, LEVOTHROID) 50 MCG tablet Take 50 mcg by mouth daily before breakfast.     lidocaine-prilocaine (EMLA) cream Apply to affected area once 30 g 3   metoprolol tartrate  (LOPRESSOR) 25 MG tablet Take 1 tablet (25 mg total) by mouth 2 (two) times daily. 180 tablet 3   nitroGLYCERIN (NITROSTAT) 0.4 MG SL tablet Place 1 tablet (0.4 mg total) under the tongue every 5 (five) minutes as needed for chest pain. 25 tablet prn   Pembrolizumab (KEYTRUDA IV) Inject 200 mg into the vein every 21 ( twenty-one) days.      prochlorperazine (COMPAZINE) 10 MG tablet Take 1 tablet (10 mg total) by mouth every 6 (six) hours as needed (Nausea or vomiting). 30 tablet 1   No current facility-administered medications for this visit.    PHYSICAL EXAMINATION: ECOG PERFORMANCE STATUS: 0 - Asymptomatic Weight 164.7 pounds, blood pressure 179/77, heart rate 58, respirate 18, temperature 97.1, pulse ox 98% on room air GENERAL:alert, no distress and comfortable SKIN: skin color, texture, turgor are normal, no rashes or significant lesions EYES: normal, Conjunctiva are pink and non-injected, sclera clear NECK: supple, thyroid normal size, non-tender, without nodularity LYMPH:  no palpable lymphadenopathy in the cervical, axillary  LUNGS: clear to auscultation and percussion with normal breathing effort HEART: regular rate & rhythm and no murmurs and no lower extremity edema ABDOMEN:abdomen soft, non-tender and normal bowel sounds Musculoskeletal:no cyanosis of digits and no clubbing  NEURO: alert & oriented x 3 with fluent speech, no focal motor/sensory deficits Patient deferred breast exam today.  LABORATORY DATA:  I have reviewed the data as listed CBC Latest Ref Rng & Units 11/05/2020 10/14/2020 09/17/2020  WBC 4.0 - 10.5 K/uL 5.4 5.5 5.9  Hemoglobin 12.0 - 15.0 g/dL 13.9 13.7 14.1  Hematocrit 36.0 - 46.0 % 44.9 44.2 44.9  Platelets 150 - 400 K/uL 162 167 175     CMP Latest Ref Rng & Units 11/05/2020 10/14/2020 09/17/2020  Glucose 70 - 99 mg/dL 100(H) 97 114(H)  BUN 8 - 23 mg/dL 19 14 14   Creatinine 0.44 - 1.00 mg/dL 1.53(H) 1.34(H) 1.45(H)  Sodium 135 - 145 mmol/L 139  139 141  Potassium 3.5 - 5.1 mmol/L 4.0 4.1 4.0  Chloride 98 - 111 mmol/L 106 106 105  CO2 22 - 32 mmol/L 26 26 26   Calcium 8.9 - 10.3 mg/dL 9.3 9.4 9.7  Total Protein 6.5 - 8.1 g/dL 6.5 6.7 6.9  Total Bilirubin 0.3 - 1.2 mg/dL 0.6 0.5 0.9  Alkaline Phos 38 - 126 U/L 69 74 71  AST 15 - 41 U/L 20 21 20   ALT 0 - 44 U/L 18 15 16       RADIOGRAPHIC STUDIES: I have personally reviewed the radiological images as listed and agreed with the findings in the report. No results found.   ASSESSMENT & PLAN:  Jessica Taylor is a 79 y.o. 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 Treated with right hemicolectomy on 10/11/2019. Path  showed grade 3 adenocarcinoma, negative margins, positive LVSI, 2 tumor deposits, 0/15 lymph nodes positive, PT3PN1C. MMR with loss of nuclear expression. MSI-high. MLH1 hyper methylation present. -PET scan in December 2020 showed lymph node in the right axillary region. Biopsy consistent with metastatic colon cancer.  -Her was treated with Right axillary lymph node excision on 04/02/2020. Path consistent with metastatic carcinoma from colon cancer. -She was started on Pembrolizumab Beryle Flock) q3weeks on 05/14/2020. Has PAC in place.  -PET scan on 08/05/2020 shows complete resolution of lymphadenopathy. No evidence of new areas of uptake. Mild vague residual hypermetabolism in the right axilla without soft tissue mass. -She is tolerating Keytruda very well, no noticeable side effects.  We discussed if she remains in NED, we can stop Keytruda after 2 years of treatment. -Continue Keytruda every 3 weeks, plan to repeat staging scan in Late Jan, before next visit in 6 weeks   2.Stage I (PT1CPN0) right Breast, Grade 2 Invasive Lobular Carcinoma: -MRI of the breast on 12/25/2019 showed 1 cm mass behind the right areola with a suspicious mass in the left breast. -S/p  B/l breast lumpectomy on 04/02/2020. Path shows right breast with invasive lobular carcinoma, grade 2, 1.2 cm.Resection margins are negative. Negative LVSI.2 sentinel lymph nodes were negative for carcinoma. ER/PR 100% positive, HER-2 negative, Ki-67 10%. ADH in left breast pathology.  -Currently on anastrozole since 04/2020.    She is tolerating very well, no significant side effects.   -We discussed that due to her lobular histology, we usually treat with anastrozole for 7-10 years if she is able to tolerate.  Given her advanced age, it would be also reasonable to do 5 years.  3. H/o Bladder cancer: -She had intermittent hematuria for several years and underwent cystoscopy at [facility].  4. CKD stage III -stable  5.  Skin itchiness -This has been intermittent, no skin rash or itchiness today.  Not sure if it is related to [facility].  I encourage patient to take photos when she has skin itchiness at home.   PLAN:  -Lab reviewed, adequate for treatment, will proceed Keytruda today and continue every 3 weeks -Continue anastrozole -f/u in 6 weeks with restaging PET    No problem-specific Assessment & Plan notes found for this encounter.   Orders Placed This Encounter  Procedures   NM PET Image Restag (PS) Skull Base To Thigh    Standing Status:   Future    Standing Expiration Date:   11/05/2021    Order Specific Question:   If indicated for the ordered procedure, I authorize the administration of a radiopharmaceutical per Radiology protocol    Answer:   Yes    Order Specific Question:   Preferred imaging location?    Answer:   [facility]    Order Specific Question:   Release to patient    Answer:   Immediate   All questions were answered. The patient knows to call the clinic with any problems, questions or concerns. No barriers to learning was detected. The total time spent in the appointment was 30 minutes.     Truitt Merle, MD 11/05/2020   I, Joslyn Devon, am  acting as scribe for Truitt Merle, MD.   I have reviewed the above documentation for accuracy and completeness, and I agree with the above.

## 2020-11-05 ENCOUNTER — Encounter (HOSPITAL_COMMUNITY): Payer: Self-pay | Admitting: Hematology

## 2020-11-05 ENCOUNTER — Other Ambulatory Visit: Payer: Self-pay

## 2020-11-05 ENCOUNTER — Inpatient Hospital Stay (HOSPITAL_BASED_OUTPATIENT_CLINIC_OR_DEPARTMENT_OTHER): Payer: Medicare Other | Admitting: Hematology

## 2020-11-05 ENCOUNTER — Inpatient Hospital Stay (HOSPITAL_COMMUNITY): Payer: Medicare Other

## 2020-11-05 ENCOUNTER — Inpatient Hospital Stay (HOSPITAL_COMMUNITY): Payer: Medicare Other | Attending: Hematology

## 2020-11-05 VITALS — BP 163/58 | HR 52 | Temp 98.0°F | Resp 18

## 2020-11-05 DIAGNOSIS — K219 Gastro-esophageal reflux disease without esophagitis: Secondary | ICD-10-CM | POA: Insufficient documentation

## 2020-11-05 DIAGNOSIS — Z79899 Other long term (current) drug therapy: Secondary | ICD-10-CM | POA: Insufficient documentation

## 2020-11-05 DIAGNOSIS — C50911 Malignant neoplasm of unspecified site of right female breast: Secondary | ICD-10-CM

## 2020-11-05 DIAGNOSIS — Z5112 Encounter for antineoplastic immunotherapy: Secondary | ICD-10-CM | POA: Diagnosis not present

## 2020-11-05 DIAGNOSIS — N183 Chronic kidney disease, stage 3 unspecified: Secondary | ICD-10-CM | POA: Diagnosis not present

## 2020-11-05 DIAGNOSIS — Z8551 Personal history of malignant neoplasm of bladder: Secondary | ICD-10-CM | POA: Diagnosis not present

## 2020-11-05 DIAGNOSIS — I129 Hypertensive chronic kidney disease with stage 1 through stage 4 chronic kidney disease, or unspecified chronic kidney disease: Secondary | ICD-10-CM | POA: Insufficient documentation

## 2020-11-05 DIAGNOSIS — Z9049 Acquired absence of other specified parts of digestive tract: Secondary | ICD-10-CM | POA: Insufficient documentation

## 2020-11-05 DIAGNOSIS — Z79811 Long term (current) use of aromatase inhibitors: Secondary | ICD-10-CM | POA: Insufficient documentation

## 2020-11-05 DIAGNOSIS — E039 Hypothyroidism, unspecified: Secondary | ICD-10-CM | POA: Diagnosis not present

## 2020-11-05 DIAGNOSIS — M1990 Unspecified osteoarthritis, unspecified site: Secondary | ICD-10-CM | POA: Insufficient documentation

## 2020-11-05 DIAGNOSIS — C182 Malignant neoplasm of ascending colon: Secondary | ICD-10-CM | POA: Insufficient documentation

## 2020-11-05 DIAGNOSIS — E7800 Pure hypercholesterolemia, unspecified: Secondary | ICD-10-CM | POA: Diagnosis not present

## 2020-11-05 DIAGNOSIS — Z170 Estrogen receptor positive status [ER+]: Secondary | ICD-10-CM | POA: Insufficient documentation

## 2020-11-05 DIAGNOSIS — R21 Rash and other nonspecific skin eruption: Secondary | ICD-10-CM | POA: Diagnosis not present

## 2020-11-05 DIAGNOSIS — Z85828 Personal history of other malignant neoplasm of skin: Secondary | ICD-10-CM | POA: Insufficient documentation

## 2020-11-05 DIAGNOSIS — I252 Old myocardial infarction: Secondary | ICD-10-CM | POA: Diagnosis not present

## 2020-11-05 DIAGNOSIS — I739 Peripheral vascular disease, unspecified: Secondary | ICD-10-CM | POA: Insufficient documentation

## 2020-11-05 DIAGNOSIS — I2510 Atherosclerotic heart disease of native coronary artery without angina pectoris: Secondary | ICD-10-CM | POA: Diagnosis not present

## 2020-11-05 DIAGNOSIS — Z7982 Long term (current) use of aspirin: Secondary | ICD-10-CM | POA: Diagnosis not present

## 2020-11-05 LAB — COMPREHENSIVE METABOLIC PANEL
ALT: 18 U/L (ref 0–44)
AST: 20 U/L (ref 15–41)
Albumin: 3.9 g/dL (ref 3.5–5.0)
Alkaline Phosphatase: 69 U/L (ref 38–126)
Anion gap: 7 (ref 5–15)
BUN: 19 mg/dL (ref 8–23)
CO2: 26 mmol/L (ref 22–32)
Calcium: 9.3 mg/dL (ref 8.9–10.3)
Chloride: 106 mmol/L (ref 98–111)
Creatinine, Ser: 1.53 mg/dL — ABNORMAL HIGH (ref 0.44–1.00)
GFR, Estimated: 34 mL/min — ABNORMAL LOW (ref >60)
Glucose, Bld: 100 mg/dL — ABNORMAL HIGH (ref 70–99)
Potassium: 4 mmol/L (ref 3.5–5.1)
Sodium: 139 mmol/L (ref 135–145)
Total Bilirubin: 0.6 mg/dL (ref 0.3–1.2)
Total Protein: 6.5 g/dL (ref 6.5–8.1)

## 2020-11-05 LAB — CBC WITH DIFFERENTIAL/PLATELET
Abs Immature Granulocytes: 0.01 10*3/uL (ref 0.00–0.07)
Basophils Absolute: 0.1 10*3/uL (ref 0.0–0.1)
Basophils Relative: 1 %
Eosinophils Absolute: 0 10*3/uL (ref 0.0–0.5)
Eosinophils Relative: 1 %
HCT: 44.9 % (ref 36.0–46.0)
Hemoglobin: 13.9 g/dL (ref 12.0–15.0)
Immature Granulocytes: 0 %
Lymphocytes Relative: 23 %
Lymphs Abs: 1.2 10*3/uL (ref 0.7–4.0)
MCH: 29.6 pg (ref 26.0–34.0)
MCHC: 31 g/dL (ref 30.0–36.0)
MCV: 95.7 fL (ref 80.0–100.0)
Monocytes Absolute: 0.4 10*3/uL (ref 0.1–1.0)
Monocytes Relative: 7 %
Neutro Abs: 3.7 10*3/uL (ref 1.7–7.7)
Neutrophils Relative %: 68 %
Platelets: 162 10*3/uL (ref 150–400)
RBC: 4.69 MIL/uL (ref 3.87–5.11)
RDW: 14.6 % (ref 11.5–15.5)
WBC: 5.4 10*3/uL (ref 4.0–10.5)
nRBC: 0 % (ref 0.0–0.2)

## 2020-11-05 LAB — TSH: TSH: 4.743 u[IU]/mL — ABNORMAL HIGH (ref 0.350–4.500)

## 2020-11-05 MED ORDER — SODIUM CHLORIDE 0.9 % IV SOLN: Freq: Once | INTRAVENOUS | Status: AC

## 2020-11-05 MED ORDER — SODIUM CHLORIDE 0.9% FLUSH
10.0000 mL | INTRAVENOUS | Status: DC | PRN
  Administered 2020-11-05 (×2): 10 mL

## 2020-11-05 MED ORDER — SODIUM CHLORIDE 0.9 % IV SOLN
200.0000 mg | Freq: Once | INTRAVENOUS | Status: AC
  Administered 2020-11-05: 200 mg via INTRAVENOUS
  Filled 2020-11-05: qty 8

## 2020-11-05 MED ORDER — HEPARIN SOD (PORK) LOCK FLUSH 100 UNIT/ML IV SOLN
500.0000 [IU] | Freq: Once | INTRAVENOUS | Status: AC | PRN
  Administered 2020-11-05: 500 [IU]

## 2020-11-05 NOTE — Progress Notes (Signed)
1110 Labs reviewed with and pt seen by Dr. Burr Medico and pt approved for Keytrruda infusion today per MD                                Jessica Taylor tolerated Keytruda infusion well without complaints or incident. VSS upon discharge. Pt discharged self ambulatory in satisfactory condition

## 2020-11-05 NOTE — Patient Instructions (Signed)
[facility] Discharge Instructions for Patients Receiving Chemotherapy   Beginning January 23rd 2017 lab work for the Cancer Center will be done in the  Main lab at [facility] on 1st floor. If you have a lab appointment with the Cancer Center please come in thru the  Main Entrance and check in at the main information desk   Today you received the following chemotherapy agents Keytruda. Follow-up as scheduled  To help prevent nausea and vomiting after your treatment, we encourage you to take your nausea medication   If you develop nausea and vomiting, or diarrhea that is not controlled by your medication, call the clinic.  The clinic phone number is [phone]. Office hours are Monday-Friday 8:30am-5:00pm.  BELOW ARE SYMPTOMS THAT SHOULD BE REPORTED IMMEDIATELY:  *FEVER GREATER THAN 101.0 F  *CHILLS WITH OR WITHOUT FEVER  NAUSEA AND VOMITING THAT IS NOT CONTROLLED WITH YOUR NAUSEA MEDICATION  *UNUSUAL SHORTNESS OF BREATH  *UNUSUAL BRUISING OR BLEEDING  TENDERNESS IN MOUTH AND THROAT WITH OR WITHOUT PRESENCE OF ULCERS  *URINARY PROBLEMS  *BOWEL PROBLEMS  UNUSUAL RASH Items with * indicate a potential emergency and should be followed up as soon as possible. If you have an emergency after office hours please contact your primary care physician or go to the nearest emergency department.  Please call the clinic during office hours if you have any questions or concerns.   You may also contact the Patient Navigator at [phone] should you have any questions or need assistance in obtaining follow up care.      Resources For Cancer Patients and their Caregivers ? American Cancer Society: Can assist with transportation, wigs, general needs, runs Look Good Feel Better.        1-888-227-6333 ? Cancer Care: Provides financial assistance, online support groups, medication/co-pay assistance.  1-800-813-HOPE (4673) ? [facility] Assists [place] cancer patients and their families through emotional , educational and financial support.  336-427-4357 ? [place] DSS Where to apply for food stamps, Medicaid and utility assistance. 336-342-1394 ? [facility]: Transportation to medical appointments. 336-347-2287 ? Social Security Administration: May apply for disability if have a Stage IV cancer. 336-342-7796 1-800-772-1213 ? [place] Aging, Disability and Transit Services: Assists with nutrition, care and transit needs. 336-349-2343         

## 2020-11-05 NOTE — Patient Instructions (Signed)
[facility] Discharge Instructions  Labs drawn from portacath today   Thank you for choosing [facility] at [facility] to provide your oncology and hematology care.  To afford each patient quality time with our provider, please arrive at least 15 minutes before your scheduled appointment time.   If you have a lab appointment with the Cancer Center please come in thru the Main Entrance and check in at the main information desk.  You need to re-schedule your appointment should you arrive 10 or more minutes late.  We strive to give you quality time with our providers, and arriving late affects you and other patients whose appointments are after yours.  Also, if you no show three or more times for appointments you may be dismissed from the clinic at the providers discretion.     Again, thank you for choosing [facility].  Our hope is that these requests will decrease the amount of time that you wait before being seen by our physicians.       _____________________________________________________________  Should you have questions after your visit to [facility], please contact our office at [phone] and follow the prompts.  Our office hours are 8:00 a.m. and 4:30 p.m. Monday - Friday.  Please note that voicemails left after 4:00 p.m. may not be returned until the following business day.  We are closed weekends and major holidays.  You do have access to a nurse 24-7, just call the main number to the clinic [phone] and do not press any options, hold on the line and a nurse will answer the phone.    For prescription refill requests, have your pharmacy contact our office and allow 72 hours.    Due to Covid, you will need to wear a mask upon entering the hospital. If you do not have a mask, a mask will be given to you at the Main Entrance upon arrival. For doctor visits, patients may have 1 support person age 18  or older with them. For treatment visits, patients can not have anyone with them due to social distancing guidelines and our immunocompromised population.     

## 2020-11-05 NOTE — Progress Notes (Signed)
Patient was assessed by Dr. Burr Medico and labs have been reviewed.  Kidney functions elevated, patient encouraged to drink more water.  Patient is okay to proceed with treatment today. Primary RN and pharmacy aware.

## 2020-11-06 ENCOUNTER — Other Ambulatory Visit (HOSPITAL_COMMUNITY): Payer: Self-pay | Admitting: Hematology

## 2020-11-06 ENCOUNTER — Other Ambulatory Visit: Payer: Self-pay | Admitting: Cardiology

## 2020-11-06 DIAGNOSIS — C50911 Malignant neoplasm of unspecified site of right female breast: Secondary | ICD-10-CM

## 2020-11-08 ENCOUNTER — Other Ambulatory Visit: Payer: Self-pay | Admitting: *Deleted

## 2020-11-08 DIAGNOSIS — I739 Peripheral vascular disease, unspecified: Secondary | ICD-10-CM

## 2020-11-08 DIAGNOSIS — I7409 Other arterial embolism and thrombosis of abdominal aorta: Secondary | ICD-10-CM

## 2020-11-08 NOTE — Progress Notes (Signed)
Right leg ABI unchanged  Left leg ABIs appear increased when compared to 2016.   Overall stable.   Repeat lower extremity ABIs/vascular ultrasound in 1 year    Orders placed as requested by Dr Marlou Porch

## 2020-11-18 ENCOUNTER — Other Ambulatory Visit: Payer: Self-pay | Admitting: Cardiology

## 2020-11-25 ENCOUNTER — Other Ambulatory Visit (HOSPITAL_COMMUNITY): Payer: Self-pay | Admitting: Family Medicine

## 2020-11-25 DIAGNOSIS — E2839 Other primary ovarian failure: Secondary | ICD-10-CM

## 2020-11-26 ENCOUNTER — Inpatient Hospital Stay (HOSPITAL_COMMUNITY): Payer: Medicare Other | Attending: Hematology

## 2020-11-26 ENCOUNTER — Inpatient Hospital Stay (HOSPITAL_COMMUNITY): Payer: Medicare Other

## 2020-11-26 ENCOUNTER — Other Ambulatory Visit: Payer: Self-pay

## 2020-11-26 VITALS — BP 158/60 | HR 60 | Temp 96.5°F | Resp 18

## 2020-11-26 DIAGNOSIS — Z9049 Acquired absence of other specified parts of digestive tract: Secondary | ICD-10-CM | POA: Diagnosis not present

## 2020-11-26 DIAGNOSIS — I252 Old myocardial infarction: Secondary | ICD-10-CM | POA: Diagnosis not present

## 2020-11-26 DIAGNOSIS — M810 Age-related osteoporosis without current pathological fracture: Secondary | ICD-10-CM | POA: Insufficient documentation

## 2020-11-26 DIAGNOSIS — Z79811 Long term (current) use of aromatase inhibitors: Secondary | ICD-10-CM | POA: Diagnosis not present

## 2020-11-26 DIAGNOSIS — C182 Malignant neoplasm of ascending colon: Secondary | ICD-10-CM | POA: Insufficient documentation

## 2020-11-26 DIAGNOSIS — Z170 Estrogen receptor positive status [ER+]: Secondary | ICD-10-CM | POA: Diagnosis not present

## 2020-11-26 DIAGNOSIS — Z7982 Long term (current) use of aspirin: Secondary | ICD-10-CM | POA: Diagnosis not present

## 2020-11-26 DIAGNOSIS — Z5112 Encounter for antineoplastic immunotherapy: Secondary | ICD-10-CM | POA: Diagnosis not present

## 2020-11-26 DIAGNOSIS — E7800 Pure hypercholesterolemia, unspecified: Secondary | ICD-10-CM | POA: Diagnosis not present

## 2020-11-26 DIAGNOSIS — R5383 Other fatigue: Secondary | ICD-10-CM | POA: Insufficient documentation

## 2020-11-26 DIAGNOSIS — Z85828 Personal history of other malignant neoplasm of skin: Secondary | ICD-10-CM | POA: Insufficient documentation

## 2020-11-26 DIAGNOSIS — Z87891 Personal history of nicotine dependence: Secondary | ICD-10-CM | POA: Diagnosis not present

## 2020-11-26 DIAGNOSIS — I10 Essential (primary) hypertension: Secondary | ICD-10-CM | POA: Diagnosis not present

## 2020-11-26 DIAGNOSIS — I739 Peripheral vascular disease, unspecified: Secondary | ICD-10-CM | POA: Diagnosis not present

## 2020-11-26 DIAGNOSIS — R197 Diarrhea, unspecified: Secondary | ICD-10-CM | POA: Insufficient documentation

## 2020-11-26 DIAGNOSIS — I2510 Atherosclerotic heart disease of native coronary artery without angina pectoris: Secondary | ICD-10-CM | POA: Diagnosis not present

## 2020-11-26 DIAGNOSIS — C50911 Malignant neoplasm of unspecified site of right female breast: Secondary | ICD-10-CM

## 2020-11-26 DIAGNOSIS — E039 Hypothyroidism, unspecified: Secondary | ICD-10-CM | POA: Diagnosis not present

## 2020-11-26 DIAGNOSIS — Z79899 Other long term (current) drug therapy: Secondary | ICD-10-CM | POA: Diagnosis not present

## 2020-11-26 DIAGNOSIS — R7989 Other specified abnormal findings of blood chemistry: Secondary | ICD-10-CM | POA: Diagnosis not present

## 2020-11-26 DIAGNOSIS — R319 Hematuria, unspecified: Secondary | ICD-10-CM | POA: Diagnosis not present

## 2020-11-26 DIAGNOSIS — M1990 Unspecified osteoarthritis, unspecified site: Secondary | ICD-10-CM | POA: Insufficient documentation

## 2020-11-26 DIAGNOSIS — K219 Gastro-esophageal reflux disease without esophagitis: Secondary | ICD-10-CM | POA: Insufficient documentation

## 2020-11-26 LAB — COMPREHENSIVE METABOLIC PANEL
ALT: 15 U/L (ref 0–44)
AST: 21 U/L (ref 15–41)
Albumin: 4 g/dL (ref 3.5–5.0)
Alkaline Phosphatase: 72 U/L (ref 38–126)
Anion gap: 9 (ref 5–15)
BUN: 17 mg/dL (ref 8–23)
CO2: 25 mmol/L (ref 22–32)
Calcium: 9.5 mg/dL (ref 8.9–10.3)
Chloride: 105 mmol/L (ref 98–111)
Creatinine, Ser: 1.55 mg/dL — ABNORMAL HIGH (ref 0.44–1.00)
GFR, Estimated: 33 mL/min — ABNORMAL LOW (ref >60)
Glucose, Bld: 113 mg/dL — ABNORMAL HIGH (ref 70–99)
Potassium: 3.7 mmol/L (ref 3.5–5.1)
Sodium: 139 mmol/L (ref 135–145)
Total Bilirubin: 0.9 mg/dL (ref 0.3–1.2)
Total Protein: 6.8 g/dL (ref 6.5–8.1)

## 2020-11-26 LAB — CBC WITH DIFFERENTIAL/PLATELET
Abs Immature Granulocytes: 0.02 10*3/uL (ref 0.00–0.07)
Basophils Absolute: 0.1 10*3/uL (ref 0.0–0.1)
Basophils Relative: 1 %
Eosinophils Absolute: 0.1 10*3/uL (ref 0.0–0.5)
Eosinophils Relative: 1 %
HCT: 46.8 % — ABNORMAL HIGH (ref 36.0–46.0)
Hemoglobin: 14.4 g/dL (ref 12.0–15.0)
Immature Granulocytes: 0 %
Lymphocytes Relative: 18 %
Lymphs Abs: 1.1 10*3/uL (ref 0.7–4.0)
MCH: 29.1 pg (ref 26.0–34.0)
MCHC: 30.8 g/dL (ref 30.0–36.0)
MCV: 94.5 fL (ref 80.0–100.0)
Monocytes Absolute: 0.4 10*3/uL (ref 0.1–1.0)
Monocytes Relative: 6 %
Neutro Abs: 4.3 10*3/uL (ref 1.7–7.7)
Neutrophils Relative %: 74 %
Platelets: 184 10*3/uL (ref 150–400)
RBC: 4.95 MIL/uL (ref 3.87–5.11)
RDW: 14.5 % (ref 11.5–15.5)
WBC: 5.9 10*3/uL (ref 4.0–10.5)
nRBC: 0 % (ref 0.0–0.2)

## 2020-11-26 LAB — TSH: TSH: 0.642 u[IU]/mL (ref 0.350–4.500)

## 2020-11-26 MED ORDER — SODIUM CHLORIDE 0.9 % IV SOLN: Freq: Once | INTRAVENOUS | Status: AC

## 2020-11-26 MED ORDER — SODIUM CHLORIDE 0.9% FLUSH
10.0000 mL | INTRAVENOUS | Status: DC | PRN
  Administered 2020-11-26: 10 mL

## 2020-11-26 MED ORDER — HEPARIN SOD (PORK) LOCK FLUSH 100 UNIT/ML IV SOLN
500.0000 [IU] | Freq: Once | INTRAVENOUS | Status: AC | PRN
Start: 2020-11-26 — End: 2020-11-26
  Administered 2020-11-26: 500 [IU]

## 2020-11-26 MED ORDER — SODIUM CHLORIDE 0.9 % IV SOLN
200.0000 mg | Freq: Once | INTRAVENOUS | Status: AC
  Administered 2020-11-26: 200 mg via INTRAVENOUS
  Filled 2020-11-26: qty 8

## 2020-11-26 NOTE — Patient Instructions (Signed)
[facility] Discharge Instructions for Patients Receiving Chemotherapy  Today you received the following chemotherapy agents   To help prevent nausea and vomiting after your treatment, we encourage you to take your nausea medication   If you develop nausea and vomiting that is not controlled by your nausea medication, call the clinic.   BELOW ARE SYMPTOMS THAT SHOULD BE REPORTED IMMEDIATELY:  *FEVER GREATER THAN 100.5 F  *CHILLS WITH OR WITHOUT FEVER  NAUSEA AND VOMITING THAT IS NOT CONTROLLED WITH YOUR NAUSEA MEDICATION  *UNUSUAL SHORTNESS OF BREATH  *UNUSUAL BRUISING OR BLEEDING  TENDERNESS IN MOUTH AND THROAT WITH OR WITHOUT PRESENCE OF ULCERS  *URINARY PROBLEMS  *BOWEL PROBLEMS  UNUSUAL RASH Items with * indicate a potential emergency and should be followed up as soon as possible.  Feel free to call the clinic should you have any questions or concerns. The clinic phone number is [phone].  Please show the CHEMO ALERT CARD at check-in to the Emergency Department and triage nurse.   

## 2020-11-26 NOTE — Progress Notes (Signed)
Patients port flushed without difficulty.  Good blood return noted with no bruising or swelling noted at site.  Transparent dressing applied.  Patient remains accessed for chemotherapy treatment.  

## 2020-11-26 NOTE — Progress Notes (Signed)
Patient presents today for Keytruda infusion.  Vital signs within parameters for treatment.  Labs pending.  Patient has no new complaints since last visit.  Labs reviewed.  Creatinine noted to be 1.55  Message received from Dr. Delton Coombes patient okay for treatment.  Keytruda infusion given today per MD orders.  Tolerated infusion without adverse affects.  Vital signs stable.  No complaints at this time.  Discharge from clinic ambulatory in stable condition.  Alert and oriented X 3.  Follow up with [facility] as scheduled.

## 2020-11-27 LAB — CEA: CEA: 9 ng/mL — ABNORMAL HIGH (ref 0.0–4.7)

## 2020-12-02 ENCOUNTER — Other Ambulatory Visit: Payer: Self-pay

## 2020-12-02 ENCOUNTER — Ambulatory Visit (HOSPITAL_COMMUNITY)
Admission: RE | Admit: 2020-12-02 | Discharge: 2020-12-02 | Disposition: A | Discharge status: Home / Self Care | Payer: Medicare Other | Source: Ambulatory Visit | Attending: Family Medicine | Admitting: Family Medicine

## 2020-12-02 DIAGNOSIS — E2839 Other primary ovarian failure: Secondary | ICD-10-CM | POA: Diagnosis not present

## 2020-12-02 DIAGNOSIS — Z853 Personal history of malignant neoplasm of breast: Secondary | ICD-10-CM | POA: Diagnosis not present

## 2020-12-02 DIAGNOSIS — Z8739 Personal history of other diseases of the musculoskeletal system and connective tissue: Secondary | ICD-10-CM | POA: Diagnosis not present

## 2020-12-02 DIAGNOSIS — Z780 Asymptomatic menopausal state: Secondary | ICD-10-CM | POA: Diagnosis not present

## 2020-12-09 ENCOUNTER — Encounter (HOSPITAL_COMMUNITY): Payer: Medicare Other

## 2020-12-17 ENCOUNTER — Other Ambulatory Visit: Payer: Self-pay

## 2020-12-17 ENCOUNTER — Inpatient Hospital Stay (HOSPITAL_COMMUNITY): Payer: Medicare Other

## 2020-12-17 ENCOUNTER — Inpatient Hospital Stay (HOSPITAL_COMMUNITY): Payer: Medicare Other | Admitting: Hematology

## 2020-12-17 VITALS — BP 135/63 | HR 63 | Temp 97.2°F | Resp 18 | Wt 161.4 lb

## 2020-12-17 VITALS — BP 140/51 | HR 60 | Temp 97.0°F | Resp 18

## 2020-12-17 DIAGNOSIS — C50911 Malignant neoplasm of unspecified site of right female breast: Secondary | ICD-10-CM

## 2020-12-17 DIAGNOSIS — Z79811 Long term (current) use of aromatase inhibitors: Secondary | ICD-10-CM | POA: Diagnosis not present

## 2020-12-17 DIAGNOSIS — E7800 Pure hypercholesterolemia, unspecified: Secondary | ICD-10-CM | POA: Diagnosis not present

## 2020-12-17 DIAGNOSIS — Z87891 Personal history of nicotine dependence: Secondary | ICD-10-CM | POA: Diagnosis not present

## 2020-12-17 DIAGNOSIS — Z9049 Acquired absence of other specified parts of digestive tract: Secondary | ICD-10-CM | POA: Diagnosis not present

## 2020-12-17 DIAGNOSIS — R319 Hematuria, unspecified: Secondary | ICD-10-CM | POA: Diagnosis not present

## 2020-12-17 DIAGNOSIS — C182 Malignant neoplasm of ascending colon: Secondary | ICD-10-CM

## 2020-12-17 DIAGNOSIS — I2510 Atherosclerotic heart disease of native coronary artery without angina pectoris: Secondary | ICD-10-CM | POA: Diagnosis not present

## 2020-12-17 DIAGNOSIS — I739 Peripheral vascular disease, unspecified: Secondary | ICD-10-CM | POA: Diagnosis not present

## 2020-12-17 DIAGNOSIS — Z170 Estrogen receptor positive status [ER+]: Secondary | ICD-10-CM | POA: Diagnosis not present

## 2020-12-17 DIAGNOSIS — Z5112 Encounter for antineoplastic immunotherapy: Secondary | ICD-10-CM | POA: Diagnosis not present

## 2020-12-17 DIAGNOSIS — R5383 Other fatigue: Secondary | ICD-10-CM | POA: Diagnosis not present

## 2020-12-17 DIAGNOSIS — I252 Old myocardial infarction: Secondary | ICD-10-CM | POA: Diagnosis not present

## 2020-12-17 DIAGNOSIS — Z7982 Long term (current) use of aspirin: Secondary | ICD-10-CM | POA: Diagnosis not present

## 2020-12-17 DIAGNOSIS — M1990 Unspecified osteoarthritis, unspecified site: Secondary | ICD-10-CM | POA: Diagnosis not present

## 2020-12-17 DIAGNOSIS — Z85828 Personal history of other malignant neoplasm of skin: Secondary | ICD-10-CM | POA: Diagnosis not present

## 2020-12-17 DIAGNOSIS — M810 Age-related osteoporosis without current pathological fracture: Secondary | ICD-10-CM | POA: Diagnosis not present

## 2020-12-17 DIAGNOSIS — R7989 Other specified abnormal findings of blood chemistry: Secondary | ICD-10-CM | POA: Diagnosis not present

## 2020-12-17 DIAGNOSIS — Z79899 Other long term (current) drug therapy: Secondary | ICD-10-CM | POA: Diagnosis not present

## 2020-12-17 DIAGNOSIS — K219 Gastro-esophageal reflux disease without esophagitis: Secondary | ICD-10-CM | POA: Diagnosis not present

## 2020-12-17 DIAGNOSIS — I10 Essential (primary) hypertension: Secondary | ICD-10-CM | POA: Diagnosis not present

## 2020-12-17 DIAGNOSIS — R197 Diarrhea, unspecified: Secondary | ICD-10-CM | POA: Diagnosis not present

## 2020-12-17 DIAGNOSIS — E039 Hypothyroidism, unspecified: Secondary | ICD-10-CM | POA: Diagnosis not present

## 2020-12-17 LAB — CBC WITH DIFFERENTIAL/PLATELET
Abs Immature Granulocytes: 0.01 10*3/uL (ref 0.00–0.07)
Basophils Absolute: 0.1 10*3/uL (ref 0.0–0.1)
Basophils Relative: 1 %
Eosinophils Absolute: 0.2 10*3/uL (ref 0.0–0.5)
Eosinophils Relative: 3 %
HCT: 46.8 % — ABNORMAL HIGH (ref 36.0–46.0)
Hemoglobin: 14.3 g/dL (ref 12.0–15.0)
Immature Granulocytes: 0 %
Lymphocytes Relative: 21 %
Lymphs Abs: 1.2 10*3/uL (ref 0.7–4.0)
MCH: 29.1 pg (ref 26.0–34.0)
MCHC: 30.6 g/dL (ref 30.0–36.0)
MCV: 95.1 fL (ref 80.0–100.0)
Monocytes Absolute: 0.4 10*3/uL (ref 0.1–1.0)
Monocytes Relative: 6 %
Neutro Abs: 4.1 10*3/uL (ref 1.7–7.7)
Neutrophils Relative %: 69 %
Platelets: 176 10*3/uL (ref 150–400)
RBC: 4.92 MIL/uL (ref 3.87–5.11)
RDW: 14.4 % (ref 11.5–15.5)
WBC: 5.9 10*3/uL (ref 4.0–10.5)
nRBC: 0 % (ref 0.0–0.2)

## 2020-12-17 LAB — COMPREHENSIVE METABOLIC PANEL
ALT: 16 U/L (ref 0–44)
AST: 19 U/L (ref 15–41)
Albumin: 4 g/dL (ref 3.5–5.0)
Alkaline Phosphatase: 79 U/L (ref 38–126)
Anion gap: 10 (ref 5–15)
BUN: 22 mg/dL (ref 8–23)
CO2: 25 mmol/L (ref 22–32)
Calcium: 9.6 mg/dL (ref 8.9–10.3)
Chloride: 105 mmol/L (ref 98–111)
Creatinine, Ser: 1.52 mg/dL — ABNORMAL HIGH (ref 0.44–1.00)
GFR, Estimated: 34 mL/min — ABNORMAL LOW (ref >60)
Glucose, Bld: 128 mg/dL — ABNORMAL HIGH (ref 70–99)
Potassium: 3.9 mmol/L (ref 3.5–5.1)
Sodium: 140 mmol/L (ref 135–145)
Total Bilirubin: 0.7 mg/dL (ref 0.3–1.2)
Total Protein: 6.7 g/dL (ref 6.5–8.1)

## 2020-12-17 LAB — TSH: TSH: 0.423 u[IU]/mL (ref 0.350–4.500)

## 2020-12-17 MED ORDER — SODIUM CHLORIDE 0.9 % IV SOLN: Freq: Once | INTRAVENOUS | Status: AC

## 2020-12-17 MED ORDER — SODIUM CHLORIDE 0.9% FLUSH
10.0000 mL | INTRAVENOUS | Status: DC | PRN
  Administered 2020-12-17: 10 mL

## 2020-12-17 MED ORDER — HEPARIN SOD (PORK) LOCK FLUSH 100 UNIT/ML IV SOLN
500.0000 [IU] | Freq: Once | INTRAVENOUS | Status: AC | PRN
Start: 2020-12-17 — End: 2020-12-17
  Administered 2020-12-17: 500 [IU]

## 2020-12-17 MED ORDER — SODIUM CHLORIDE 0.9 % IV SOLN
200.0000 mg | Freq: Once | INTRAVENOUS | Status: AC
  Administered 2020-12-17: 200 mg via INTRAVENOUS
  Filled 2020-12-17: qty 8

## 2020-12-17 NOTE — Progress Notes (Signed)
Patient assessed and labs reviewed by Dr. Katragadda. Okay to proceed with treatment. Primary RN and pharmacy aware. 

## 2020-12-17 NOTE — Progress Notes (Signed)
Jessica Taylor, [place] [address]   CLINIC:  Medical Oncology/Hematology  PCP:  Antony Contras, MD [address] / [place] [address] [phone]   REASON FOR VISIT:  Follow-up for stage I right breast cancer and colon cancer  PRIOR THERAPY:  1. Robotic proximal colectomy on 10/11/2019. 2. Right breast lumpectomy and SLNB on 04/02/2020.  NGS Results: MSI--high, Foundation 1 not sent  CURRENT THERAPY: Keytruda every 3 weeks  BRIEF ONCOLOGIC HISTORY:  Oncology History  Cancer of ascending colon s/p robotic proximal colectomy 10/11/2019  10/11/2019 Initial Diagnosis   Cancer of ascending colon s/p robotic proximal colectomy 10/11/2019   10/23/2019 Cancer Staging   Staging form: Colon and Rectum, AJCC 8th Edition - Clinical stage from 10/23/2019: Stage IVA (cT3, cN1c, cM1a) - Signed by Derek Jack, MD on 12/14/2019   05/14/2020 -  Chemotherapy    Patient is on Treatment Plan: COLORECTAL PEMBROLIZUMAB Q21D      Malignant neoplasm of right female breast ([place])  04/23/2020 Initial Diagnosis   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 [facility])   04/23/2020 Cancer Staging   Staging form: Breast, AJCC 8th Edition - Clinical stage from 04/23/2020: Stage IA (cT1c, cN0(sn), cM0, G2, ER+, PR+, HER2-) - Signed by Derek Jack, MD on 04/23/2020     CANCER STAGING: Cancer Staging Cancer of ascending colon s/p robotic proximal colectomy 10/11/2019 Staging form: Colon and Rectum, AJCC 8th Edition - Clinical stage from 10/23/2019: Stage IVA (cT3, cN1c, cM1a) - Signed by Derek Jack, MD on 12/14/2019  Malignant neoplasm of right female breast [facility]) Staging form: Breast, AJCC 8th Edition - Clinical stage from 04/23/2020: Stage IA (cT1c, cN0(sn), cM0, G2, ER+, PR+, HER2-) - Signed by Derek Jack, MD on 04/23/2020   INTERVAL HISTORY:  [name], a 80 y.o. female, returns for routine  follow-up and consideration for next cycle of immunotherapy. Jessica Taylor was last seen on 09/17/2020.  Due for cycle #11 of Keytruda today.   Overall, she tells me she has been feeling okay. She reports that she had hematuria after the previous treatment lasting 2 days and some diarrhea lasting 24 hours especially after eating, but otherwise she tolerated it well. She is tolerating Arimidex well. She reports having dry skin and itching over her entire body which she scratches. Her appetite is good and her energy levels are decreased, but she denies feeling fatigued after the treatments.  She has an appointment with urology in [facility] on 2/14. She will have her PET scan on 1/31.  Overall, she feels ready for next cycle of immunotherapy today.    REVIEW OF SYSTEMS:  Review of Systems  Constitutional: Positive for appetite change (50%) and fatigue (25%).  Gastrointestinal: Positive for diarrhea.  Genitourinary: Positive for hematuria.   All other systems reviewed and are negative.   PAST MEDICAL/SURGICAL HISTORY:  Past Medical History:  Diagnosis Date  . Abdominal aortic aneurysm ([place])   . Acid reflux   . Anemia   . Arthritis    knees , R shoulder - tx /w injection - 11/2014  . Basal cell carcinoma (BCC) of dorsum of nose 2010   Resolved  . Cancer ([place])    basal cell on nose  . Colon cancer ([place])   . Coronary artery disease   . Hypercholesteremia   . Hypertension   . Hypothyroidism   . Lt Acute pyelonephritis 09/11/2018  . MI, old 58  . Peripheral arterial  disease ([place])    hhigh-grade ostial bilateral calcified iliac stenosis with claudication  . Tobacco abuse   . Vertigo    when lays on left side.   Past Surgical History:  Procedure Laterality Date  . BREAST LUMPECTOMY WITH RADIOACTIVE SEED AND SENTINEL LYMPH NODE BIOPSY Bilateral 04/02/2020   Procedure: BILATERAL BREAST LUMPECTOMY WITH RADIOACTIVE SEED AND RIGHT SENTINEL LYMPH NODE BIOPSY AND RIGHT TARGETED AXILLARY LYMPH  NODE BIOPSY;  Surgeon: Erroll Luna, MD;  Location: [place];  Service: General;  Laterality: Bilateral;  . CARDIAC CATHETERIZATION  5 stents  . CARDIAC CATHETERIZATION N/A 01/20/2016   Procedure: Left Heart Cath and Coronary Angiography;  Surgeon: Peter M Martinique, MD;  Location: [place] CV LAB;  Service: Cardiovascular;  Laterality: N/A;  . CATARACT EXTRACTION W/PHACO Left 05/24/2014   Procedure: CATARACT EXTRACTION PHACO AND INTRAOCULAR LENS PLACEMENT (IOC);  Surgeon: Tonny Branch, MD;  [facility]: [facility];  Service: Ophthalmology;  Laterality: Left;  CDE:  9.30  . CATARACT EXTRACTION W/PHACO Right 06/18/2014   Procedure: CATARACT EXTRACTION PHACO AND INTRAOCULAR LENS PLACEMENT RIGHT EYE CDE=10.84;  Surgeon: Tonny Branch, MD;  Location: [facility];  Service: Ophthalmology;  Laterality: Right;  . CORONARY ARTERY BYPASS GRAFT N/A 01/24/2016   Procedure: CORONARY ARTERY BYPASS GRAFTING (CABG);  Surgeon: Gaye Pollack, MD;  Location: [place];  Service: Open Heart Surgery;  Laterality: N/A;  . CORONARY STENT PLACEMENT    . CORONARY STENT PLACEMENT  03/06/15   CFX DES  . CYSTOSCOPY/URETEROSCOPY/HOLMIUM LASER/STENT PLACEMENT Left 09/12/2018   Procedure: CYSTOSCOPY/URETEROSCOPY/STENT PLACEMENT;  Surgeon: Ceasar Mons, MD;  [facility]: [facility];  Service: Urology;  Laterality: Left;  . EYE SURGERY    . LEFT HEART CATH AND CORS/GRAFTS ANGIOGRAPHY N/A 09/12/2019   Procedure: LEFT HEART CATH AND CORS/GRAFTS ANGIOGRAPHY;  Surgeon: [place] Sine, MD;  Location: [place] CV LAB;  Service: Cardiovascular;  Laterality: N/A;  . LEFT HEART CATHETERIZATION WITH CORONARY ANGIOGRAM N/A 03/06/2015   Procedure: LEFT HEART CATHETERIZATION WITH CORONARY ANGIOGRAM;  Surgeon: Lorretta Harp, MD;  Location: [facility] CATH LAB;  Service: Cardiovascular;  Laterality: N/A;  . PARTIAL KNEE ARTHROPLASTY Right 02/04/2015   Procedure: UNICOMPARTMENTAL KNEE;  Surgeon: Dorna Leitz, MD;  Location: [place];  Service:  Orthopedics;  Laterality: Right;  . PERIPHERAL VASCULAR CATHETERIZATION Bilateral 05/13/2015   Procedure: Lower Extremity Angiography;  Surgeon: Lorretta Harp, MD;  Location: [place] CV LAB;  Service: Cardiovascular;  Laterality: Bilateral;  . PERIPHERAL VASCULAR CATHETERIZATION N/A 05/13/2015   Procedure: Abdominal Aortogram;  Surgeon: Lorretta Harp, MD;  Location: [place] CV LAB;  Service: Cardiovascular;  Laterality: N/A;  . PERIPHERAL VASCULAR CATHETERIZATION Bilateral 06/27/2015   Procedure: Peripheral Vascular Intervention;  Surgeon: Lorretta Harp, MD;  Location: [place] CV LAB;  Service: Cardiovascular;  Laterality: Bilateral;  ILIACS  . PERIPHERAL VASCULAR CATHETERIZATION Bilateral 06/27/2015   Procedure: Peripheral Vascular Atherectomy;  Surgeon: Lorretta Harp, MD;  Location: [place] CV LAB;  Service: Cardiovascular;  Laterality: Bilateral;  . PORTACATH PLACEMENT Left 05/24/2020   Procedure: INSERTION PORT-A-CATH;  Surgeon: Aviva Signs, MD;  Location: [facility];  Service: General;  Laterality: Left;  . TEE WITHOUT CARDIOVERSION N/A 01/24/2016   Procedure: TRANSESOPHAGEAL ECHOCARDIOGRAM (TEE);  Surgeon: Gaye Pollack, MD;  Location: [place];  Service: Open Heart Surgery;  Laterality: N/A;  . TONSILLECTOMY     age 80  . TUBAL LIGATION      SOCIAL HISTORY:  Social History   Socioeconomic History  .  Marital status: Married    Spouse name: [name]  . Number of children: 4  . Years of education: Not on file  . Highest education level: Not on file  Occupational History  . Occupation: retired    Comment: worked as Presenter, broadcasting for [employer]  Tobacco Use  . Smoking status: Former Smoker    Packs/day: 1.50    Years: 42.00    Pack years: 63.00    Types: Cigarettes    Quit date: 05/19/1999    Years since quitting: 21.5  . Smokeless tobacco: Never Used  Vaping Use  . Vaping Use: Never used  Substance and Sexual Activity  . Alcohol use: No  . Drug use:  No  . Sexual activity: Yes    Birth control/protection: Surgical  Other Topics Concern  . Not on file  Social History Narrative  . Not on file   Social Determinants of Health   Financial Resource Strain: Not on file  Food Insecurity: Not on file  Transportation Needs: Not on file  Physical Activity: Not on file  Stress: Not on file  Social Connections: Not on file  Intimate Partner Violence: Not on file    FAMILY HISTORY:  Family History  Problem Relation Age of Onset  . Ovarian cancer Mother 50  . Cancer Father 98       unsure of which kind, "it was in his glands"  . Hypertension Maternal Grandmother   . Stroke Maternal Grandfather   . Hypertension Son   . Brain cancer Sister   . Hypertension Daughter   . Heart attack Neg Hx   . Colon cancer Neg Hx   . Esophageal cancer Neg Hx   . Inflammatory bowel disease Neg Hx   . Liver disease Neg Hx   . Pancreatic cancer Neg Hx   . Rectal cancer Neg Hx   . Stomach cancer Neg Hx     CURRENT MEDICATIONS:  Current Outpatient Medications  Medication Sig Dispense Refill  . anastrozole (ARIMIDEX) 1 MG tablet TAKE 1 TABLET BY MOUTH ONCE DAILY. 30 tablet 0  . aspirin EC 81 MG tablet Take 81 mg by mouth every evening.     Marland Kitchen atorvastatin (LIPITOR) 40 MG tablet TAKE (1) TABLET BY MOUTH ONCE DAILY. 90 tablet 3  . clopidogrel (PLAVIX) 75 MG tablet TAKE (1) TABLET BY MOUTH ONCE DAILY. 90 tablet 3  . esomeprazole (NEXIUM) 20 MG capsule Take 20 mg by mouth daily.     Marland Kitchen HYDROcodone-acetaminophen (NORCO/VICODIN) 5-325 MG tablet 1 tablet as needed    . levothyroxine (SYNTHROID, LEVOTHROID) 50 MCG tablet Take 50 mcg by mouth daily before breakfast.    . lidocaine-prilocaine (EMLA) cream Apply to affected area once 30 g 3  . metoprolol tartrate (LOPRESSOR) 25 MG tablet Take 1 tablet (25 mg total) by mouth 2 (two) times daily. 180 tablet 3  . nitroGLYCERIN (NITROSTAT) 0.4 MG SL tablet Place 1 tablet (0.4 mg total) under the tongue every 5 (five)  minutes as needed for chest pain. 25 tablet prn  . olmesartan (BENICAR) 5 MG tablet 1 tablet    . Pembrolizumab (KEYTRUDA IV) Inject 200 mg into the vein every 21 ( twenty-one) days.     . prochlorperazine (COMPAZINE) 10 MG tablet Take 1 tablet (10 mg total) by mouth every 6 (six) hours as needed (Nausea or vomiting). 30 tablet 1   No current facility-administered medications for this visit.    ALLERGIES:  Allergies  Allergen Reactions  . Crab [  Shellfish Allergy] Nausea And Vomiting    Throws up violently  . Ezetimibe Diarrhea  . Other Nausea And Vomiting    Shrimp    PHYSICAL EXAM:  Performance status (ECOG): 1 - Symptomatic but completely ambulatory  Vitals:   12/17/20 1224  BP: 135/63  Pulse: 63  Resp: 18  Temp: (!) 97.2 F (36.2 C)  SpO2: 96%   Wt Readings from Last 3 Encounters:  12/17/20 161 lb 6.4 oz (73.2 kg)  11/05/20 164 lb 11.2 oz (74.7 kg)  10/14/20 164 lb 12.8 oz (74.8 kg)   Physical Exam Vitals reviewed.  Constitutional:      Appearance: Normal appearance.  Cardiovascular:     Rate and Rhythm: Normal rate and regular rhythm.     Pulses: Normal pulses.     Heart sounds: Normal heart sounds.  Pulmonary:     Effort: Pulmonary effort is normal.     Breath sounds: Normal breath sounds.  Chest:  Breasts:     Left: No axillary adenopathy or supraclavicular adenopathy.      Comments: Port-a-Cath in L chest Musculoskeletal:     Right lower leg: No edema.     Left lower leg: No edema.  Lymphadenopathy:     Upper Body:     Left upper body: No supraclavicular, axillary or pectoral adenopathy.  Neurological:     General: No focal deficit present.     Mental Status: She is alert and oriented to person, place, and time.  Psychiatric:        Mood and Affect: Mood normal.        Behavior: Behavior normal.     LABORATORY DATA:  I have reviewed the labs as listed.  CBC Latest Ref Rng & Units 12/17/2020 11/26/2020 11/05/2020  WBC 4.0 - 10.5 K/uL 5.9 5.9 5.4   Hemoglobin 12.0 - 15.0 g/dL 14.3 14.4 13.9  Hematocrit 36.0 - 46.0 % 46.8(H) 46.8(H) 44.9  Platelets 150 - 400 K/uL 176 184 162   CMP Latest Ref Rng & Units 12/17/2020 11/26/2020 11/05/2020  Glucose 70 - 99 mg/dL 128(H) 113(H) 100(H)  BUN 8 - 23 mg/dL _0 Creatinine 0.44 - 1.00 mg/dL 1.52(H) 1.55(H) 1.53(H)  Sodium 135 - 145 mmol/L 140 139 139  Potassium 3.5 - 5.1 mmol/L 3.9 3.7 4.0  Chloride 98 - 111 mmol/L 105 105 106  CO2 22 - 32 mmol/L _1 Calcium 8.9 - 10.3 mg/dL 9.6 9.5 9.3  Total Protein 6.5 - 8.1 g/dL 6.7 6.8 6.5  Total Bilirubin 0.3 - 1.2 mg/dL 0.7 0.9 0.6  Alkaline Phos 38 - 126 U/L 79 72 69  AST 15 - 41 U/L _2 ALT 0 - 44 U/L _3 Lab Results  Component Value Date   CEA1 9.0 (H) 11/26/2020   CEA1 9.7 (H) 10/14/2020   CEA1 10.3 (H) 09/17/2020    DIAGNOSTIC IMAGING:  I have independently reviewed the scans and discussed with the patient. DG BONE DENSITY (DXA)  Result Date: 12/02/2020 EXAM: DUAL X-RAY ABSORPTIOMETRY (DXA) FOR BONE MINERAL DENSITY IMPRESSION: Your patient Pina Sirianni completed a BMD test on 12/02/2020 using the [facility] (software version: 14.10) manufactured by [employer]. The following summarizes the results of our evaluation. Technologist::TNB PATIENT BIOGRAPHICAL: Name: Royalty, Fakhouri Patient ID: [id number] Birth Date: 1941-10-28 Height: 63.0 in. Gender: Female Exam Date: 12/02/2020 Weight: 164.7 lbs. Indications: Caucasian, Hx Breast Ca, Low Calcium Intake, Post Menopausal Fractures:  Treatments: Arimidex DENSITOMETRY RESULTS: Site         Region     Measured Date Measured Age WHO Classification Young Adult T-score BMD         %Change vs. Previous Significant Change (*) DualFemur Neck Left 12/02/2020 81.7 Osteoporosis -2.6 0.677 g/cm2 Left Forearm Radius 33% 12/02/2020 81.7 Normal -0.6 0.669 g/cm2 ASSESSMENT: The BMD measured at Femur Neck Left is 0.677 g/cm2 with a T-score of -2.6. This patient is  considered osteoporotic according to [facility]) criteria. The scan quality is good. Lumbar spine was excluded due to advanced degenerative changes. Patient is not a candidate for FRAX assessment due to diagnosis of osteoporosis. World Pharmacologist [facility] criteria for post-menopausal, Caucasian Women: Normal:       T-score at or above -1 SD Osteopenia:   T-score between -1 and -2.5 SD Osteoporosis: T-score at or below -2.5 SD RECOMMENDATIONS: 1. All patients should optimize calcium and vitamin D intake. 2. Consider FDA-approved medical therapies in postmenopausal women and med aged 68 years and older, based on the following: a. A hip or vertebral (clinical or morphometric) fracture b. T-score< -2.5 at the femoral neck or spine after appropriate evaluation to exclude secondary causes c. Low bone mass (T-score between -1.0 and -2.5 at the femoral neck or spine) and a 10-year probability of a hip fracture > 3% or a 10-year probability of a major osteoporosis-related fracture > 20% based on the [place]-adapted WHO algorithm d. Clinician judgment and/or patient preferences may indicate treatment for people with 10-year fracture probabilities above or below these levels FOLLOW-UP: People with diagnosed cases of osteoporosis or at high risk for fracture should have regular bone mineral density tests. For patients eligible for Medicare, routine testing is allowed once every 2 years. The testing frequency can be increased to one year for patients who have rapidly progressing disease, those who are receiving or discontinuing medical therapy to restore bone mass, or have additional risk factors. I have reviewed this report, and agree with the above findings. [facility] Electronically Signed   By: Lowella Grip III M.D.   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 Right hemicolectomy on 10/11/2019, grade 3 adenocarcinoma, negative margins, positive LVSI, 2 tumor deposits, 0/15 lymph nodes positive, PT3PN1C. -MMR with loss of nuclear expression. MSI-high. MLH1 hyper methylation present. -PET scan in December 2020 showed lymph node in the right axillary region. Biopsy consistent with metastatic colon cancer. -Last CEA was 10.7 on 11/22/2019. -Right axillary lymph node excision on 04/02/2020 consistent with metastatic carcinoma from colon cancer. -Pembrolizumab started on 05/14/2020. -PET scan on 08/05/2020 shows complete resolution of lymphadenopathy. No evidence of new areas of uptake. Mild vague residual hypermetabolism in the right axilla without soft tissue mass.  2.Stage I (PT1CPN0) right Breast, Grade 2 Invasive Lobular Carcinoma: -MRI of the breast on 12/25/2019 showed 1 cm mass behind the right areola with a suspicious mass in the left breast. -Right breast lumpectomy on 04/02/2020 shows invasive lobular carcinoma, grade 2, 1.2 cm. Resection margins are negative. Negative LVSI. 2 sentinel lymph nodes were negative for carcinoma. ER/PR 100% positive, HER-2 negative, Ki-67 10%.   PLAN:  1.Stage IV colon cancer to the right axillary lymph node: -She had diarrhea for 1 day which subsided by itself. -She has some dry skin leading to itching and rash, mostly on the upper back.  Recommend moisturizing lotion twice daily. -Reviewed labs which showed normal LFTs and CBC. -No palpable adenopathy in the bilateral axillary region. -We will proceed with Keytruda today.  RTC in 3 weeks with repeat PET CT scan.  2.Right breast invasive lobular carcinoma, grade 2: -She is continuing anastrozole without any problems.  3.  Osteoporosis: -DEXA scan done by her PMD shows T score -2.6. -We will check vitamin D levels.  Will evaluate at next visit if she is a candidate for Prolia.  4. Bladder cancer: -She follows up with urology  at [facility] next month. -She reported hematuria for couple of days in the last 3 weeks.  This has resolved at this time.  5. Elevated creatinine: -Creatinine at baseline of 1.5.   Orders placed this encounter:  Orders Placed This Encounter  Procedures  . CBC with Differential/Platelet  . Comprehensive metabolic panel  . VITAMIN D 25 Hydroxy (Vit-D Deficiency, Fractures)  . TSH     Derek Jack, MD [place] [phone]   I, Milinda Antis, am acting as a scribe for Dr. Sanda Linger.  I, Derek Jack MD, have reviewed the above documentation for accuracy and completeness, and I agree with the above.

## 2020-12-17 NOTE — Patient Instructions (Signed)
[place] at [facility] Discharge Instructions  You were seen today by Dr. Delton Coombes. He went over your recent results and scans; your bone density scan shows that you have osteoporosis. You received your treatment today. Keep your appointment to have your PET scan on 1/31. Apply a lotion on your dry skin to keep it moisturized and free from itching. Dr. Delton Coombes will see you back in 3 weeks for labs and follow up.   Thank you for choosing [place] at [facility] to provide your oncology and hematology care.  To afford each patient quality time with our provider, please arrive at least 15 minutes before your scheduled appointment time.   If you have a lab appointment with the [facility] please come in thru the Main Entrance and check in at the main information desk  You need to re-schedule your appointment should you arrive 10 or more minutes late.  We strive to give you quality time with our providers, and arriving late affects you and other patients whose appointments are after yours.  Also, if you no show three or more times for appointments you may be dismissed from the clinic at the providers discretion.     Again, thank you for choosing [facility].  Our hope is that these requests will decrease the amount of time that you wait before being seen by our physicians.       _____________________________________________________________  Should you have questions after your visit to [facility], please contact our office at [phone] between the hours of 8:00 a.m. and 4:30 p.m.  Voicemails left after 4:00 p.m. will not be returned until the following business day.  For prescription refill requests, have your pharmacy contact our office and allow 72 hours.    Cancer Center Support Programs:   > Cancer Support Group  2nd Tuesday of the month 1pm-2pm, Journey Room

## 2020-12-17 NOTE — Patient Instructions (Signed)
[facility] Discharge Instructions for Patients Receiving Chemotherapy  Today you received the following chemotherapy agents   To help prevent nausea and vomiting after your treatment, we encourage you to take your nausea medication   If you develop nausea and vomiting that is not controlled by your nausea medication, call the clinic.   BELOW ARE SYMPTOMS THAT SHOULD BE REPORTED IMMEDIATELY:  *FEVER GREATER THAN 100.5 F  *CHILLS WITH OR WITHOUT FEVER  NAUSEA AND VOMITING THAT IS NOT CONTROLLED WITH YOUR NAUSEA MEDICATION  *UNUSUAL SHORTNESS OF BREATH  *UNUSUAL BRUISING OR BLEEDING  TENDERNESS IN MOUTH AND THROAT WITH OR WITHOUT PRESENCE OF ULCERS  *URINARY PROBLEMS  *BOWEL PROBLEMS  UNUSUAL RASH Items with * indicate a potential emergency and should be followed up as soon as possible.  Feel free to call the clinic should you have any questions or concerns. The clinic phone number is [phone].  Please show the CHEMO ALERT CARD at check-in to the Emergency Department and triage nurse.   

## 2020-12-17 NOTE — Progress Notes (Signed)
Patient presents today for Keytruda infusion.  Vital signs within parameters for treatment.  Labs pending.  Patient complains of some blood in her urine that lasted approx. three days and then it cleared up without any interventions.  Patient states that she thinks it might be kidney stones and that she has had them in the past.  Instructed the patient to make Dr. Delton Coombes aware.   Labs reviewed, creatinine noted to be 1.52.  Message received from [place] patient okay for treatment.  Treatment given today per MD orders.  Tolerated infusion without adverse affects.  Vital signs stable.  No complaints at this time.  Discharge from clinic ambulatory in stable condition.  Alert and oriented X 3.  Follow up with [facility] as scheduled.

## 2020-12-17 NOTE — Patient Instructions (Signed)
[facility] at [facility] Discharge Instructions  Labs drawn from portacath today   Thank you for choosing [facility] at [facility] to provide your oncology and hematology care.  To afford each patient quality time with our provider, please arrive at least 15 minutes before your scheduled appointment time.   If you have a lab appointment with the Cancer Center please come in thru the Main Entrance and check in at the main information desk.  You need to re-schedule your appointment should you arrive 10 or more minutes late.  We strive to give you quality time with our providers, and arriving late affects you and other patients whose appointments are after yours.  Also, if you no show three or more times for appointments you may be dismissed from the clinic at the providers discretion.     Again, thank you for choosing [facility].  Our hope is that these requests will decrease the amount of time that you wait before being seen by our physicians.       _____________________________________________________________  Should you have questions after your visit to [facility], please contact our office at [phone] and follow the prompts.  Our office hours are 8:00 a.m. and 4:30 p.m. Monday - Friday.  Please note that voicemails left after 4:00 p.m. may not be returned until the following business day.  We are closed weekends and major holidays.  You do have access to a nurse 24-7, just call the main number to the clinic [phone] and do not press any options, hold on the line and a nurse will answer the phone.    For prescription refill requests, have your pharmacy contact our office and allow 72 hours.    Due to Covid, you will need to wear a mask upon entering the hospital. If you do not have a mask, a mask will be given to you at the Main Entrance upon arrival. For doctor visits, patients may have 1 support person age 18  or older with them. For treatment visits, patients can not have anyone with them due to social distancing guidelines and our immunocompromised population.     

## 2020-12-18 ENCOUNTER — Telehealth: Payer: Self-pay | Admitting: *Deleted

## 2020-12-18 NOTE — Telephone Encounter (Signed)
Pt received pfizer 1st dose 12-12-2019, 2nd dose 01-02-2020 at [facility] and [place] booster on 10-23-2020 [facility] in [place]

## 2020-12-23 ENCOUNTER — Ambulatory Visit (HOSPITAL_COMMUNITY)
Admission: RE | Admit: 2020-12-23 | Discharge: 2020-12-23 | Disposition: A | Discharge status: Home / Self Care | Payer: Medicare Other | Source: Ambulatory Visit | Attending: Hematology | Admitting: Hematology

## 2020-12-23 ENCOUNTER — Other Ambulatory Visit: Payer: Self-pay

## 2020-12-23 DIAGNOSIS — C50919 Malignant neoplasm of unspecified site of unspecified female breast: Secondary | ICD-10-CM | POA: Diagnosis not present

## 2020-12-23 DIAGNOSIS — C50911 Malignant neoplasm of unspecified site of right female breast: Secondary | ICD-10-CM | POA: Diagnosis not present

## 2020-12-23 MED ORDER — FLUDEOXYGLUCOSE F - 18 (FDG) INJECTION
9.3100 | Freq: Once | INTRAVENOUS | Status: AC | PRN
  Administered 2020-12-23: 9.31 via INTRAVENOUS

## 2021-01-07 ENCOUNTER — Other Ambulatory Visit: Payer: Self-pay

## 2021-01-07 ENCOUNTER — Inpatient Hospital Stay (HOSPITAL_COMMUNITY): Payer: Medicare Other | Attending: Hematology | Admitting: Hematology

## 2021-01-07 ENCOUNTER — Inpatient Hospital Stay (HOSPITAL_COMMUNITY): Payer: Medicare Other

## 2021-01-07 ENCOUNTER — Other Ambulatory Visit (HOSPITAL_COMMUNITY): Payer: Self-pay | Admitting: Hematology

## 2021-01-07 VITALS — BP 151/72 | HR 69 | Temp 97.0°F | Resp 20 | Wt 160.4 lb

## 2021-01-07 VITALS — BP 151/63 | HR 54 | Temp 97.1°F | Resp 18

## 2021-01-07 DIAGNOSIS — Z79811 Long term (current) use of aromatase inhibitors: Secondary | ICD-10-CM | POA: Insufficient documentation

## 2021-01-07 DIAGNOSIS — Z8041 Family history of malignant neoplasm of ovary: Secondary | ICD-10-CM | POA: Insufficient documentation

## 2021-01-07 DIAGNOSIS — C50911 Malignant neoplasm of unspecified site of right female breast: Secondary | ICD-10-CM | POA: Diagnosis not present

## 2021-01-07 DIAGNOSIS — Z87891 Personal history of nicotine dependence: Secondary | ICD-10-CM | POA: Insufficient documentation

## 2021-01-07 DIAGNOSIS — Z808 Family history of malignant neoplasm of other organs or systems: Secondary | ICD-10-CM | POA: Diagnosis not present

## 2021-01-07 DIAGNOSIS — Z170 Estrogen receptor positive status [ER+]: Secondary | ICD-10-CM | POA: Diagnosis not present

## 2021-01-07 DIAGNOSIS — Z809 Family history of malignant neoplasm, unspecified: Secondary | ICD-10-CM | POA: Diagnosis not present

## 2021-01-07 DIAGNOSIS — M810 Age-related osteoporosis without current pathological fracture: Secondary | ICD-10-CM | POA: Insufficient documentation

## 2021-01-07 DIAGNOSIS — C773 Secondary and unspecified malignant neoplasm of axilla and upper limb lymph nodes: Secondary | ICD-10-CM | POA: Insufficient documentation

## 2021-01-07 DIAGNOSIS — Z5112 Encounter for antineoplastic immunotherapy: Secondary | ICD-10-CM | POA: Diagnosis not present

## 2021-01-07 DIAGNOSIS — C182 Malignant neoplasm of ascending colon: Secondary | ICD-10-CM | POA: Diagnosis not present

## 2021-01-07 DIAGNOSIS — Z79899 Other long term (current) drug therapy: Secondary | ICD-10-CM | POA: Diagnosis not present

## 2021-01-07 LAB — CBC WITH DIFFERENTIAL/PLATELET
Abs Immature Granulocytes: 0.02 10*3/uL (ref 0.00–0.07)
Basophils Absolute: 0.1 10*3/uL (ref 0.0–0.1)
Basophils Relative: 1 %
Eosinophils Absolute: 0.2 10*3/uL (ref 0.0–0.5)
Eosinophils Relative: 2 %
HCT: 45.4 % (ref 36.0–46.0)
Hemoglobin: 14.2 g/dL (ref 12.0–15.0)
Immature Granulocytes: 0 %
Lymphocytes Relative: 13 %
Lymphs Abs: 0.9 10*3/uL (ref 0.7–4.0)
MCH: 29.6 pg (ref 26.0–34.0)
MCHC: 31.3 g/dL (ref 30.0–36.0)
MCV: 94.8 fL (ref 80.0–100.0)
Monocytes Absolute: 0.4 10*3/uL (ref 0.1–1.0)
Monocytes Relative: 6 %
Neutro Abs: 5.3 10*3/uL (ref 1.7–7.7)
Neutrophils Relative %: 78 %
Platelets: 195 10*3/uL (ref 150–400)
RBC: 4.79 MIL/uL (ref 3.87–5.11)
RDW: 14 % (ref 11.5–15.5)
WBC: 6.8 10*3/uL (ref 4.0–10.5)
nRBC: 0 % (ref 0.0–0.2)

## 2021-01-07 LAB — COMPREHENSIVE METABOLIC PANEL
ALT: 12 U/L (ref 0–44)
AST: 17 U/L (ref 15–41)
Albumin: 3.9 g/dL (ref 3.5–5.0)
Alkaline Phosphatase: 69 U/L (ref 38–126)
Anion gap: 9 (ref 5–15)
BUN: 19 mg/dL (ref 8–23)
CO2: 23 mmol/L (ref 22–32)
Calcium: 9.7 mg/dL (ref 8.9–10.3)
Chloride: 105 mmol/L (ref 98–111)
Creatinine, Ser: 1.54 mg/dL — ABNORMAL HIGH (ref 0.44–1.00)
GFR, Estimated: 34 mL/min — ABNORMAL LOW (ref >60)
Glucose, Bld: 125 mg/dL — ABNORMAL HIGH (ref 70–99)
Potassium: 3.7 mmol/L (ref 3.5–5.1)
Sodium: 137 mmol/L (ref 135–145)
Total Bilirubin: 0.7 mg/dL (ref 0.3–1.2)
Total Protein: 6.9 g/dL (ref 6.5–8.1)

## 2021-01-07 LAB — TSH: TSH: 0.451 u[IU]/mL (ref 0.350–4.500)

## 2021-01-07 LAB — VITAMIN D 25 HYDROXY (VIT D DEFICIENCY, FRACTURES): Vit D, 25-Hydroxy: 13.27 ng/mL — ABNORMAL LOW (ref 30–100)

## 2021-01-07 MED ORDER — HEPARIN SOD (PORK) LOCK FLUSH 100 UNIT/ML IV SOLN
500.0000 [IU] | Freq: Once | INTRAVENOUS | Status: AC | PRN
  Administered 2021-01-07: 500 [IU]

## 2021-01-07 MED ORDER — SODIUM CHLORIDE 0.9% FLUSH
10.0000 mL | INTRAVENOUS | Status: DC | PRN
  Administered 2021-01-07: 10 mL

## 2021-01-07 MED ORDER — SODIUM CHLORIDE 0.9 % IV SOLN: Freq: Once | INTRAVENOUS | Status: AC

## 2021-01-07 MED ORDER — ERGOCALCIFEROL 1.25 MG (50000 UT) PO CAPS: 50000.0000 [IU] | ORAL_CAPSULE | ORAL | 4 refills | Status: DC

## 2021-01-07 MED ORDER — SODIUM CHLORIDE 0.9 % IV SOLN
200.0000 mg | Freq: Once | INTRAVENOUS | Status: AC
  Administered 2021-01-07: 200 mg via INTRAVENOUS
  Filled 2021-01-07: qty 8

## 2021-01-07 NOTE — Progress Notes (Signed)
Patient was assessed by Dr. Katragadda and labs have been reviewed. Treatment pending lab results. Primary RN and pharmacy aware.   

## 2021-01-07 NOTE — Patient Instructions (Signed)
[facility] Discharge Instructions for Patients Receiving Chemotherapy  Today you received the following chemotherapy agents   To help prevent nausea and vomiting after your treatment, we encourage you to take your nausea medication   If you develop nausea and vomiting that is not controlled by your nausea medication, call the clinic.   BELOW ARE SYMPTOMS THAT SHOULD BE REPORTED IMMEDIATELY:  *FEVER GREATER THAN 100.5 F  *CHILLS WITH OR WITHOUT FEVER  NAUSEA AND VOMITING THAT IS NOT CONTROLLED WITH YOUR NAUSEA MEDICATION  *UNUSUAL SHORTNESS OF BREATH  *UNUSUAL BRUISING OR BLEEDING  TENDERNESS IN MOUTH AND THROAT WITH OR WITHOUT PRESENCE OF ULCERS  *URINARY PROBLEMS  *BOWEL PROBLEMS  UNUSUAL RASH Items with * indicate a potential emergency and should be followed up as soon as possible.  Feel free to call the clinic should you have any questions or concerns. The clinic phone number is [phone].  Please show the CHEMO ALERT CARD at check-in to the Emergency Department and triage nurse.   

## 2021-01-07 NOTE — Patient Instructions (Signed)
[facility] at [facility] Discharge Instructions  You were seen today by Dr. Delton Coombes. He went over your recent results and scans. You received your treatment today; continue getting treatment every 3 weeks. Take a stool softener daily to keep your bowel movements regular. Dr. Delton Coombes will see you back in 6 weeks for labs and follow up.   Thank you for choosing [facility] at [facility] to provide your oncology and hematology care.  To afford each patient quality time with our provider, please arrive at least 15 minutes before your scheduled appointment time.   If you have a lab appointment with the [facility] please come in thru the Main Entrance and check in at the main information desk  You need to re-schedule your appointment should you arrive 10 or more minutes late.  We strive to give you quality time with our providers, and arriving late affects you and other patients whose appointments are after yours.  Also, if you no show three or more times for appointments you may be dismissed from the clinic at the providers discretion.     Again, thank you for choosing [facility].  Our hope is that these requests will decrease the amount of time that you wait before being seen by our physicians.       _____________________________________________________________  Should you have questions after your visit to [facility], please contact our office at [phone] between the hours of 8:00 a.m. and 4:30 p.m.  Voicemails left after 4:00 p.m. will not be returned until the following business day.  For prescription refill requests, have your pharmacy contact our office and allow 72 hours.    Cancer Center Support Programs:   > Cancer Support Group  2nd Tuesday of the month 1pm-2pm, Journey Room

## 2021-01-07 NOTE — Progress Notes (Signed)
Pt here for [place].  Creatinine stable at 1.54.  Okay for treatment.   Pt tolerated [place] without incidence today.  Discharged in stable condition ambulatory. Vital signs stable prior to discharge.

## 2021-01-07 NOTE — Progress Notes (Signed)
Jessica Taylor, [place] [address]   CLINIC:  Medical Oncology/Hematology  PCP:  Antony Contras, MD [address] / [place] [address] [phone]   REASON FOR VISIT:  Follow-up for stage I right breast cancer and colon cancer  PRIOR THERAPY:  1. Robotic proximal colectomy on 10/11/2019. 2. Right breast lumpectomy and SLNB on 04/02/2020.  NGS Results: MSI--high, Foundation 1 not sent  CURRENT THERAPY: Keytruda every 3 weeks  BRIEF ONCOLOGIC HISTORY:  Oncology History  Cancer of ascending colon s/p robotic proximal colectomy 10/11/2019  10/11/2019 Initial Diagnosis   Cancer of ascending colon s/p robotic proximal colectomy 10/11/2019   10/23/2019 Cancer Staging   Staging form: Colon and Rectum, AJCC 8th Edition - Clinical stage from 10/23/2019: Stage IVA (cT3, cN1c, cM1a) - Signed by Derek Jack, MD on 12/14/2019   05/14/2020 -  Chemotherapy    Patient is on Treatment Plan: COLORECTAL PEMBROLIZUMAB Q21D      Malignant neoplasm of right female breast ([facility])  04/23/2020 Initial Diagnosis   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 [facility])   04/23/2020 Cancer Staging   Staging form: Breast, AJCC 8th Edition - Clinical stage from 04/23/2020: Stage IA (cT1c, cN0(sn), cM0, G2, ER+, PR+, HER2-) - Signed by Derek Jack, MD on 04/23/2020     CANCER STAGING: Cancer Staging Cancer of ascending colon s/p robotic proximal colectomy 10/11/2019 Staging form: Colon and Rectum, AJCC 8th Edition - Clinical stage from 10/23/2019: Stage IVA (cT3, cN1c, cM1a) - Signed by Derek Jack, MD on 12/14/2019  Malignant neoplasm of right female breast [facility]) Staging form: Breast, AJCC 8th Edition - Clinical stage from 04/23/2020: Stage IA (cT1c, cN0(sn), cM0, G2, ER+, PR+, HER2-) - Signed by Derek Jack, MD on 04/23/2020   INTERVAL HISTORY:  Jessica Taylor, a 80 y.o. female, returns for routine  follow-up and consideration for next cycle of immunotherapy. Jessica Taylor was last seen on 12/17/2020.  Due for cycle #12 of Keytruda today.   Overall, she tells me she has been feeling fair. She reports having several episodes of diarrhea for 1 day after having a period of constipation and did not need to take Imodium. Her last BM was on 02/11 and she takes a stool softener as needed. She is having some abdominal soreness as a result. Her appetite is very low this week and was better last week.  Overall, she feels ready for next cycle of immunotherapy today.    REVIEW OF SYSTEMS:  Review of Systems  Constitutional: Positive for appetite change (depleted) and fatigue (depleted).  Gastrointestinal: Positive for abdominal pain (soreness), constipation (last BM on 02/11) and nausea.  All other systems reviewed and are negative.   PAST MEDICAL/SURGICAL HISTORY:  Past Medical History:  Diagnosis Date  . Abdominal aortic aneurysm ([facility])   . Acid reflux   . Anemia   . Arthritis    knees , R shoulder - tx /w injection - 11/2014  . Basal cell carcinoma (BCC) of dorsum of nose 2010   Resolved  . Cancer ([facility])    basal cell on nose  . Colon cancer ([facility])   . Coronary artery disease   . Hypercholesteremia   . Hypertension   . Hypothyroidism   . Lt Acute pyelonephritis 09/11/2018  . MI, old 69  . Peripheral arterial disease (H[facility])    hhigh-grade ostial bilateral calcified iliac stenosis with claudication  . Tobacco abuse   . Vertigo  when lays on left side.   Past Surgical History:  Procedure Laterality Date  . BREAST LUMPECTOMY WITH RADIOACTIVE SEED AND SENTINEL LYMPH NODE BIOPSY Bilateral 04/02/2020   Procedure: BILATERAL BREAST LUMPECTOMY WITH RADIOACTIVE SEED AND RIGHT SENTINEL LYMPH NODE BIOPSY AND RIGHT TARGETED AXILLARY LYMPH NODE BIOPSY;  Surgeon: Erroll Luna, MD;  Location: [facility];  Service: General;  Laterality: Bilateral;  . CARDIAC CATHETERIZATION  5 stents   . CARDIAC CATHETERIZATION N/A 01/20/2016   Procedure: Left Heart Cath and Coronary Angiography;  Surgeon: Peter M Martinique, MD;  Location: [facility];  Service: Cardiovascular;  Laterality: N/A;  . CATARACT EXTRACTION W/PHACO Left 05/24/2014   Procedure: CATARACT EXTRACTION PHACO AND INTRAOCULAR LENS PLACEMENT (IOC);  Surgeon: Tonny Branch, MD;  Location: [facility];  Service: Ophthalmology;  Laterality: Left;  CDE:  9.30  . CATARACT EXTRACTION W/PHACO Right 06/18/2014   Procedure: CATARACT EXTRACTION PHACO AND INTRAOCULAR LENS PLACEMENT RIGHT EYE CDE=10.84;  Surgeon: Tonny Branch, MD;  Location: [facility];  Service: Ophthalmology;  Laterality: Right;  . CORONARY ARTERY BYPASS GRAFT N/A 01/24/2016   Procedure: CORONARY ARTERY BYPASS GRAFTING (CABG);  Surgeon: Gaye Pollack, MD;  Location: [facility];  Service: Open Heart Surgery;  Laterality: N/A;  . CORONARY STENT PLACEMENT    . CORONARY STENT PLACEMENT  03/06/15   CFX DES  . CYSTOSCOPY/URETEROSCOPY/HOLMIUM LASER/STENT PLACEMENT Left 09/12/2018   Procedure: CYSTOSCOPY/URETEROSCOPY/STENT PLACEMENT;  Surgeon: Ceasar Mons, MD;  Location: [facility];  Service: Urology;  Laterality: Left;  . EYE SURGERY    . LEFT HEART CATH AND CORS/GRAFTS ANGIOGRAPHY N/A 09/12/2019   Procedure: LEFT HEART CATH AND CORS/GRAFTS ANGIOGRAPHY;  Surgeon: Troy Sine, MD;  Location: [facility];  Service: Cardiovascular;  Laterality: N/A;  . LEFT HEART CATHETERIZATION WITH CORONARY ANGIOGRAM N/A 03/06/2015   Procedure: LEFT HEART CATHETERIZATION WITH CORONARY ANGIOGRAM;  Surgeon: Lorretta Harp, MD;  Location: [facility];  Service: Cardiovascular;  Laterality: N/A;  . PARTIAL KNEE ARTHROPLASTY Right 02/04/2015   Procedure: UNICOMPARTMENTAL KNEE;  Surgeon: Dorna Leitz, MD;  Location: [facility];  Service: Orthopedics;  Laterality: Right;  . PERIPHERAL VASCULAR CATHETERIZATION Bilateral 05/13/2015   Procedure: Lower Extremity Angiography;  Surgeon: Lorretta Harp, MD;   Location: [facility];  Service: Cardiovascular;  Laterality: Bilateral;  . PERIPHERAL VASCULAR CATHETERIZATION N/A 05/13/2015   Procedure: Abdominal Aortogram;  Surgeon: Lorretta Harp, MD;  Location: [facility];  Service: Cardiovascular;  Laterality: N/A;  . PERIPHERAL VASCULAR CATHETERIZATION Bilateral 06/27/2015   Procedure: Peripheral Vascular Intervention;  Surgeon: Lorretta Harp, MD;  Location: [facility];  Service: Cardiovascular;  Laterality: Bilateral;  ILIACS  . PERIPHERAL VASCULAR CATHETERIZATION Bilateral 06/27/2015   Procedure: Peripheral Vascular Atherectomy;  Surgeon: Lorretta Harp, MD;  Location: [facility];  Service: Cardiovascular;  Laterality: Bilateral;  . PORTACATH PLACEMENT Left 05/24/2020   Procedure: INSERTION PORT-A-CATH;  Surgeon: Aviva Signs, MD;  Location: [facility];  Service: General;  Laterality: Left;  . TEE WITHOUT CARDIOVERSION N/A 01/24/2016   Procedure: TRANSESOPHAGEAL ECHOCARDIOGRAM (TEE);  Surgeon: Gaye Pollack, MD;  Location: [facility];  Service: Open Heart Surgery;  Laterality: N/A;  . TONSILLECTOMY     age 58  . TUBAL LIGATION      SOCIAL HISTORY:  Social History   Socioeconomic History  . Marital status: Married    Spouse name: [name]  . Number of children: 4  . Years of education: Not on  file  . Highest education level: Not on file  Occupational History  . Occupation: retired    Comment: worked as Presenter, broadcasting for [employer]  Tobacco Use  . Smoking status: Former Smoker    Packs/day: 1.50    Years: 42.00    Pack years: 63.00    Types: Cigarettes    Quit date: 05/19/1999    Years since quitting: 21.6  . Smokeless tobacco: Never Used  Vaping Use  . Vaping Use: Never used  Substance and Sexual Activity  . Alcohol use: No  . Drug use: No  . Sexual activity: Yes    Birth control/protection: Surgical  Other Topics Concern  . Not on file  Social History Narrative  . Not on file   Social  Determinants of Health   Financial Resource Strain: Not on file  Food Insecurity: Not on file  Transportation Needs: Not on file  Physical Activity: Not on file  Stress: Not on file  Social Connections: Not on file  Intimate Partner Violence: Not on file    FAMILY HISTORY:  Family History  Problem Relation Age of Onset  . Ovarian cancer Mother 25  . Cancer Father 76       unsure of which kind, "it was in his glands"  . Hypertension Maternal Grandmother   . Stroke Maternal Grandfather   . Hypertension Son   . Brain cancer Sister   . Hypertension Daughter   . Heart attack Neg Hx   . Colon cancer Neg Hx   . Esophageal cancer Neg Hx   . Inflammatory bowel disease Neg Hx   . Liver disease Neg Hx   . Pancreatic cancer Neg Hx   . Rectal cancer Neg Hx   . Stomach cancer Neg Hx     CURRENT MEDICATIONS:  Current Outpatient Medications  Medication Sig Dispense Refill  . anastrozole (ARIMIDEX) 1 MG tablet TAKE 1 TABLET BY MOUTH ONCE DAILY. 30 tablet 0  . aspirin EC 81 MG tablet Take 81 mg by mouth every evening.     Marland Kitchen atorvastatin (LIPITOR) 40 MG tablet TAKE (1) TABLET BY MOUTH ONCE DAILY. 90 tablet 3  . clopidogrel (PLAVIX) 75 MG tablet TAKE (1) TABLET BY MOUTH ONCE DAILY. 90 tablet 3  . esomeprazole (NEXIUM) 20 MG capsule Take 20 mg by mouth daily.     Marland Kitchen levothyroxine (SYNTHROID, LEVOTHROID) 50 MCG tablet Take 50 mcg by mouth daily before breakfast.    . metoprolol tartrate (LOPRESSOR) 25 MG tablet Take 1 tablet (25 mg total) by mouth 2 (two) times daily. 180 tablet 3  . olmesartan (BENICAR) 5 MG tablet 1 tablet    . Pembrolizumab (KEYTRUDA IV) Inject 200 mg into the vein every 21 ( twenty-one) days.     Marland Kitchen HYDROcodone-acetaminophen (NORCO/VICODIN) 5-325 MG tablet 1 tablet as needed (Patient not taking: Reported on 01/07/2021)    . lidocaine-prilocaine (EMLA) cream Apply to affected area once (Patient not taking: Reported on 01/07/2021) 30 g 3  . nitroGLYCERIN (NITROSTAT) 0.4 MG SL  tablet Place 1 tablet (0.4 mg total) under the tongue every 5 (five) minutes as needed for chest pain. (Patient not taking: Reported on 01/07/2021) 25 tablet prn  . prochlorperazine (COMPAZINE) 10 MG tablet Take 1 tablet (10 mg total) by mouth every 6 (six) hours as needed (Nausea or vomiting). (Patient not taking: Reported on 01/07/2021) 30 tablet 1   No current facility-administered medications for this visit.   Facility-Administered Medications Ordered in Other Visits  Medication  Dose Route Frequency Provider Last Rate Last Admin  . 0.9 %  sodium chloride infusion   Intravenous Once Derek Jack, MD      . heparin lock flush 100 unit/mL  500 Units Intracatheter Once PRN Derek Jack, MD      . pembrolizumab [facility]) 200 mg in sodium chloride 0.9 % 50 mL chemo infusion  200 mg Intravenous Once Derek Jack, MD      . sodium chloride flush (NS) 0.9 % injection 10 mL  10 mL Intracatheter PRN Derek Jack, MD        ALLERGIES:  Allergies  Allergen Reactions  . Crab [Shellfish Allergy] Nausea And Vomiting    Throws up violently  . Ezetimibe Diarrhea  . Other Nausea And Vomiting    Shrimp    PHYSICAL EXAM:  Performance status (ECOG): 1 - Symptomatic but completely ambulatory  Vitals:   01/07/21 0953  BP: (!) 151/72  Pulse: 69  Resp: 20  Temp: (!) 97 F (36.1 C)  SpO2: 99%   Wt Readings from Last 3 Encounters:  01/07/21 160 lb 6.4 oz (72.8 kg)  12/17/20 161 lb 6.4 oz (73.2 kg)  11/05/20 164 lb 11.2 oz (74.7 kg)   Physical Exam Vitals reviewed.  Constitutional:      Appearance: Normal appearance.  Cardiovascular:     Rate and Rhythm: Normal rate and regular rhythm.     Pulses: Normal pulses.     Heart sounds: Normal heart sounds.  Pulmonary:     Effort: Pulmonary effort is normal.     Breath sounds: Normal breath sounds.  Chest:  Breasts:     Right: No axillary adenopathy.     Left: No axillary adenopathy.      Comments: Port-a-Cath  in L chest Musculoskeletal:     Right lower leg: No edema.     Left lower leg: No edema.  Lymphadenopathy:     Upper Body:     Right upper body: No axillary or pectoral adenopathy.     Left upper body: No axillary or pectoral adenopathy.  Neurological:     General: No focal deficit present.     Mental Status: She is alert and oriented to person, place, and time.  Psychiatric:        Mood and Affect: Mood normal.        Behavior: Behavior normal.     LABORATORY DATA:  I have reviewed the labs as listed.  CBC Latest Ref Rng & Units 01/07/2021 12/17/2020 11/26/2020  WBC 4.0 - 10.5 K/uL 6.8 5.9 5.9  Hemoglobin 12.0 - 15.0 g/dL 14.2 14.3 14.4  Hematocrit 36.0 - 46.0 % 45.4 46.8(H) 46.8(H)  Platelets 150 - 400 K/uL 195 176 184   CMP Latest Ref Rng & Units 01/07/2021 12/17/2020 11/26/2020  Glucose 70 - 99 mg/dL 125(H) 128(H) 113(H)  BUN 8 - 23 mg/dL _0 Creatinine 0.44 - 1.00 mg/dL 1.54(H) 1.52(H) 1.55(H)  Sodium 135 - 145 mmol/L 137 140 139  Potassium 3.5 - 5.1 mmol/L 3.7 3.9 3.7  Chloride 98 - 111 mmol/L 105 105 105  CO2 22 - 32 mmol/L _1 Calcium 8.9 - 10.3 mg/dL 9.7 9.6 9.5  Total Protein 6.5 - 8.1 g/dL 6.9 6.7 6.8  Total Bilirubin 0.3 - 1.2 mg/dL 0.7 0.7 0.9  Alkaline Phos 38 - 126 U/L 69 79 72  AST 15 - 41 U/L _2 ALT 0 - 44 U/L _3 No results  found for: [facility] Lab Results  Component Value Date   CEA1 9.0 (H) 11/26/2020   CEA1 9.7 (H) 10/14/2020   CEA1 10.3 (H) 09/17/2020    DIAGNOSTIC IMAGING:  I have independently reviewed the scans and discussed with the patient. NM PET Image Restag (PS) Skull Base To Thigh  Result Date: 12/24/2020 CLINICAL DATA:  Subsequent treatment strategy for breast carcinoma. Previous surgery and chemotherapy. EXAM: NUCLEAR MEDICINE PET SKULL BASE TO THIGH TECHNIQUE: 9.3 mCi F-18 FDG was injected intravenously. Full-ring PET imaging was performed from the skull base to thigh after the radiotracer. CT data was obtained and used  for attenuation correction and anatomic localization. Fasting blood glucose: 117 mg/dl COMPARISON:  08/05/2020 FINDINGS: Mediastinal blood-pool activity (background): SUV max = 3.0 Liver activity (reference): SUV max = N/A NECK:  No hypermetabolic lymph nodes or masses. Incidental CT findings: Aortic and coronary atherosclerotic calcification noted. Prior CABG. CHEST: FDG uptake is again seen at post lumpectomy sites in both breasts, but is decreased since prior study. No hypermetabolic axillary lymph nodes. No other hypermetabolic masses or lymphadenopathy in the thorax. No suspicious pulmonary nodules seen on CT images. Incidental CT findings:  None. ABDOMEN/PELVIS: No abnormal hypermetabolic activity within the liver, pancreas, adrenal glands, or spleen. No hypermetabolic lymph nodes in the abdomen or pelvis. Prominent FDG activity is seen throughout the gluteal and proximal thigh muscles, right side greater than left. Incidental CT findings: 3.2 cm infrarenal abdominal aortic aneurysm shows no significant change. Stents are again seen in bilateral common iliac arteries. Tiny bilateral renal calculi versus vascular calcification. Diverticulosis is seen mainly involving the sigmoid colon, however there is no evidence of diverticulitis. SKELETON: No focal hypermetabolic bone lesions to suggest skeletal metastasis. Incidental CT findings:  None. IMPRESSION: No evidence of recurrent or metastatic carcinoma. Stable 3.2 cm infrarenal abdominal aortic aneurysm. Recommend follow-up ultrasound every 3 years. This recommendation follows ACR consensus guidelines: White Paper of the ACR Incidental Findings Committee II on Vascular Findings. J Am Coll Radiol 2013; 10:789-794. Electronically Signed   By: Marlaine Hind M.D.   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 Right hemicolectomy on  10/11/2019, grade 3 adenocarcinoma, negative margins, positive LVSI, 2 tumor deposits, 0/15 lymph nodes positive, PT3PN1C. -MMR with loss of nuclear expression. MSI-high. MLH1 hyper methylation present. -PET scan in December 2020 showed lymph node in the right axillary region. Biopsy consistent with metastatic colon cancer. -Last CEA was 10.7 on 11/22/2019. -Right axillary lymph node excision on 04/02/2020 consistent with metastatic carcinoma from colon cancer. -Pembrolizumab started on 05/14/2020. -PET scan on 08/05/2020 shows complete resolution of lymphadenopathy. No evidence of new areas of uptake. Mild vague residual hypermetabolism in the right axilla without soft tissue mass. -PET scan on 12/23/2020 with no evidence of recurrence.  2.Stage I (PT1CPN0) right Breast, Grade 2 Invasive Lobular Carcinoma: -MRI of the breast on 12/25/2019 showed 1 cm mass behind the right areola with a suspicious mass in the left breast. -Right breast lumpectomy on 04/02/2020 shows invasive lobular carcinoma, grade 2, 1.2 cm. Resection margins are negative. Negative LVSI. 2 sentinel lymph nodes were negative for carcinoma. ER/PR 100% positive, HER-2 negative, Ki-67 10%.   PLAN:  1.Stage IV colon cancer to the right axillary lymph node: -Reviewed results of the PET scan from 12/23/2020 which showed no evidence of recurrence or metastatic disease. -She is tolerating Keytruda reasonably well.  She has 1 or 2 days of diarrhea after each treatment. -Reviewed labs today which showed normal LFTs.  TSH is 0.451.  We will closely monitor. -Proceed with treatment today and in 3 weeks.  RTC in 6 weeks.  2.Right breast invasive lobular carcinoma, grade 2: -Continue anastrozole without any problems.  3.  Osteoporosis: -DEXA scan with T score -2.6. -Vitamin D level is 13.27. -We will start her on vitamin D 50,000 units weekly. -We will consider Prolia injections.  4. Bladder cancer: -She has follow-up  with urology later this week.  5. Elevated creatinine: -Creatinine elevated at 1.54 and stable.   Orders placed this encounter:  Orders Placed This Encounter  Procedures  . CBC with Differential/Platelet  . Comprehensive metabolic panel  . CEA     Derek Jack, MD [place] [phone]   I, Milinda Antis, am acting as a scribe for Dr. Sanda Linger.  I, Derek Jack MD, have reviewed the above documentation for accuracy and completeness, and I agree with the above.

## 2021-01-09 DIAGNOSIS — N3020 Other chronic cystitis without hematuria: Secondary | ICD-10-CM | POA: Diagnosis not present

## 2021-01-14 ENCOUNTER — Other Ambulatory Visit: Payer: Self-pay | Admitting: Nurse Practitioner

## 2021-01-15 ENCOUNTER — Encounter (HOSPITAL_COMMUNITY): Payer: Self-pay

## 2021-01-15 NOTE — Progress Notes (Signed)
I have called patient twice but am unable to reach her and V/M is not set up. Provider is aware that I have attempted to reach patient.

## 2021-01-28 ENCOUNTER — Inpatient Hospital Stay (HOSPITAL_COMMUNITY): Payer: Medicare Other | Attending: Hematology

## 2021-01-28 ENCOUNTER — Other Ambulatory Visit: Payer: Self-pay

## 2021-01-28 ENCOUNTER — Other Ambulatory Visit (HOSPITAL_COMMUNITY): Payer: Medicare Other

## 2021-01-28 ENCOUNTER — Encounter (HOSPITAL_COMMUNITY): Payer: Self-pay

## 2021-01-28 ENCOUNTER — Inpatient Hospital Stay (HOSPITAL_COMMUNITY): Payer: Medicare Other

## 2021-01-28 ENCOUNTER — Ambulatory Visit (HOSPITAL_COMMUNITY): Payer: Medicare Other

## 2021-01-28 VITALS — BP 154/71 | HR 50 | Temp 97.0°F | Resp 18 | Wt 158.1 lb

## 2021-01-28 DIAGNOSIS — Z5112 Encounter for antineoplastic immunotherapy: Secondary | ICD-10-CM | POA: Diagnosis not present

## 2021-01-28 DIAGNOSIS — Z170 Estrogen receptor positive status [ER+]: Secondary | ICD-10-CM | POA: Insufficient documentation

## 2021-01-28 DIAGNOSIS — Z7982 Long term (current) use of aspirin: Secondary | ICD-10-CM | POA: Insufficient documentation

## 2021-01-28 DIAGNOSIS — C50911 Malignant neoplasm of unspecified site of right female breast: Secondary | ICD-10-CM | POA: Diagnosis not present

## 2021-01-28 DIAGNOSIS — Z79899 Other long term (current) drug therapy: Secondary | ICD-10-CM | POA: Insufficient documentation

## 2021-01-28 DIAGNOSIS — C182 Malignant neoplasm of ascending colon: Secondary | ICD-10-CM | POA: Insufficient documentation

## 2021-01-28 DIAGNOSIS — Z87891 Personal history of nicotine dependence: Secondary | ICD-10-CM | POA: Insufficient documentation

## 2021-01-28 DIAGNOSIS — E039 Hypothyroidism, unspecified: Secondary | ICD-10-CM | POA: Diagnosis not present

## 2021-01-28 DIAGNOSIS — I10 Essential (primary) hypertension: Secondary | ICD-10-CM | POA: Insufficient documentation

## 2021-01-28 DIAGNOSIS — C773 Secondary and unspecified malignant neoplasm of axilla and upper limb lymph nodes: Secondary | ICD-10-CM | POA: Insufficient documentation

## 2021-01-28 DIAGNOSIS — M810 Age-related osteoporosis without current pathological fracture: Secondary | ICD-10-CM | POA: Insufficient documentation

## 2021-01-28 DIAGNOSIS — I252 Old myocardial infarction: Secondary | ICD-10-CM | POA: Insufficient documentation

## 2021-01-28 DIAGNOSIS — Z79811 Long term (current) use of aromatase inhibitors: Secondary | ICD-10-CM | POA: Insufficient documentation

## 2021-01-28 DIAGNOSIS — Z85828 Personal history of other malignant neoplasm of skin: Secondary | ICD-10-CM | POA: Diagnosis not present

## 2021-01-28 LAB — CBC WITH DIFFERENTIAL/PLATELET
Abs Immature Granulocytes: 0.01 10*3/uL (ref 0.00–0.07)
Basophils Absolute: 0.1 10*3/uL (ref 0.0–0.1)
Basophils Relative: 1 %
Eosinophils Absolute: 0.1 10*3/uL (ref 0.0–0.5)
Eosinophils Relative: 1 %
HCT: 45.3 % (ref 36.0–46.0)
Hemoglobin: 14.2 g/dL (ref 12.0–15.0)
Immature Granulocytes: 0 %
Lymphocytes Relative: 18 %
Lymphs Abs: 1 10*3/uL (ref 0.7–4.0)
MCH: 29.4 pg (ref 26.0–34.0)
MCHC: 31.3 g/dL (ref 30.0–36.0)
MCV: 93.8 fL (ref 80.0–100.0)
Monocytes Absolute: 0.4 10*3/uL (ref 0.1–1.0)
Monocytes Relative: 7 %
Neutro Abs: 3.8 10*3/uL (ref 1.7–7.7)
Neutrophils Relative %: 73 %
Platelets: 167 10*3/uL (ref 150–400)
RBC: 4.83 MIL/uL (ref 3.87–5.11)
RDW: 14 % (ref 11.5–15.5)
WBC: 5.2 10*3/uL (ref 4.0–10.5)
nRBC: 0 % (ref 0.0–0.2)

## 2021-01-28 LAB — COMPREHENSIVE METABOLIC PANEL
ALT: 12 U/L (ref 0–44)
AST: 16 U/L (ref 15–41)
Albumin: 4.1 g/dL (ref 3.5–5.0)
Alkaline Phosphatase: 66 U/L (ref 38–126)
Anion gap: 9 (ref 5–15)
BUN: 15 mg/dL (ref 8–23)
CO2: 24 mmol/L (ref 22–32)
Calcium: 9.6 mg/dL (ref 8.9–10.3)
Chloride: 104 mmol/L (ref 98–111)
Creatinine, Ser: 1.4 mg/dL — ABNORMAL HIGH (ref 0.44–1.00)
GFR, Estimated: 38 mL/min — ABNORMAL LOW (ref >60)
Glucose, Bld: 117 mg/dL — ABNORMAL HIGH (ref 70–99)
Potassium: 3.7 mmol/L (ref 3.5–5.1)
Sodium: 137 mmol/L (ref 135–145)
Total Bilirubin: 1 mg/dL (ref 0.3–1.2)
Total Protein: 6.7 g/dL (ref 6.5–8.1)

## 2021-01-28 MED ORDER — HEPARIN SOD (PORK) LOCK FLUSH 100 UNIT/ML IV SOLN
500.0000 [IU] | Freq: Once | INTRAVENOUS | Status: AC | PRN
  Administered 2021-01-28: 500 [IU]

## 2021-01-28 MED ORDER — SODIUM CHLORIDE 0.9 % IV SOLN
Freq: Once | INTRAVENOUS | Status: AC
Start: 2021-01-28 — End: 2021-01-28

## 2021-01-28 MED ORDER — SODIUM CHLORIDE 0.9 % IV SOLN
200.0000 mg | Freq: Once | INTRAVENOUS | Status: AC
  Administered 2021-01-28: 200 mg via INTRAVENOUS
  Filled 2021-01-28: qty 8

## 2021-01-28 MED ORDER — SODIUM CHLORIDE 0.9% FLUSH
10.0000 mL | INTRAVENOUS | Status: DC | PRN
  Administered 2021-01-28: 10 mL

## 2021-01-28 NOTE — Progress Notes (Signed)
Patient presents today for treatment. Vital signs are stable. Patient denies any pain today. Patient has complaints of itching that has been ongoing. Dr. Delton Coombes aware. No rash noted upon assessment . Labs pending.   Labs within parameters for treatment.   Treatment given today per MD orders. Tolerated infusion without adverse affects. Vital signs stable. No complaints at this time. Discharged from clinic ambulatory in stable condition. Alert and oriented x 3. F/U with [facility] as scheduled.

## 2021-01-28 NOTE — Patient Instructions (Signed)
[facility] Discharge Instructions for Patients Receiving Chemotherapy  Today you received Keytruda.   To help prevent nausea and vomiting after your treatment, we encourage you to take your nausea medication   If you develop nausea and vomiting that is not controlled by your nausea medication, call the clinic.   BELOW ARE SYMPTOMS THAT SHOULD BE REPORTED IMMEDIATELY:  *FEVER GREATER THAN 100.5 F  *CHILLS WITH OR WITHOUT FEVER  NAUSEA AND VOMITING THAT IS NOT CONTROLLED WITH YOUR NAUSEA MEDICATION  *UNUSUAL SHORTNESS OF BREATH  *UNUSUAL BRUISING OR BLEEDING  TENDERNESS IN MOUTH AND THROAT WITH OR WITHOUT PRESENCE OF ULCERS  *URINARY PROBLEMS  *BOWEL PROBLEMS  UNUSUAL RASH Items with * indicate a potential emergency and should be followed up as soon as possible.  Feel free to call the clinic should you have any questions or concerns. The clinic phone number is [phone].  Please show the [facility] at check-in to the Emergency Department and triage nurse.

## 2021-01-29 LAB — CEA: CEA: 9.5 ng/mL — ABNORMAL HIGH (ref 0.0–4.7)

## 2021-02-18 ENCOUNTER — Inpatient Hospital Stay (HOSPITAL_COMMUNITY): Payer: Medicare Other

## 2021-02-18 ENCOUNTER — Other Ambulatory Visit: Payer: Self-pay

## 2021-02-18 ENCOUNTER — Inpatient Hospital Stay (HOSPITAL_BASED_OUTPATIENT_CLINIC_OR_DEPARTMENT_OTHER): Payer: Medicare Other | Admitting: Hematology

## 2021-02-18 VITALS — BP 127/78 | HR 49 | Temp 98.1°F | Resp 16

## 2021-02-18 VITALS — BP 151/70 | HR 52 | Temp 97.0°F | Resp 16 | Wt 158.6 lb

## 2021-02-18 DIAGNOSIS — Z85828 Personal history of other malignant neoplasm of skin: Secondary | ICD-10-CM | POA: Diagnosis not present

## 2021-02-18 DIAGNOSIS — Z7982 Long term (current) use of aspirin: Secondary | ICD-10-CM | POA: Diagnosis not present

## 2021-02-18 DIAGNOSIS — M810 Age-related osteoporosis without current pathological fracture: Secondary | ICD-10-CM | POA: Diagnosis not present

## 2021-02-18 DIAGNOSIS — E039 Hypothyroidism, unspecified: Secondary | ICD-10-CM | POA: Diagnosis not present

## 2021-02-18 DIAGNOSIS — C773 Secondary and unspecified malignant neoplasm of axilla and upper limb lymph nodes: Secondary | ICD-10-CM | POA: Diagnosis not present

## 2021-02-18 DIAGNOSIS — Z5112 Encounter for antineoplastic immunotherapy: Secondary | ICD-10-CM | POA: Diagnosis not present

## 2021-02-18 DIAGNOSIS — C50911 Malignant neoplasm of unspecified site of right female breast: Secondary | ICD-10-CM

## 2021-02-18 DIAGNOSIS — C182 Malignant neoplasm of ascending colon: Secondary | ICD-10-CM

## 2021-02-18 DIAGNOSIS — Z87891 Personal history of nicotine dependence: Secondary | ICD-10-CM | POA: Diagnosis not present

## 2021-02-18 DIAGNOSIS — Z79899 Other long term (current) drug therapy: Secondary | ICD-10-CM | POA: Diagnosis not present

## 2021-02-18 DIAGNOSIS — I252 Old myocardial infarction: Secondary | ICD-10-CM | POA: Diagnosis not present

## 2021-02-18 DIAGNOSIS — Z170 Estrogen receptor positive status [ER+]: Secondary | ICD-10-CM | POA: Diagnosis not present

## 2021-02-18 DIAGNOSIS — I10 Essential (primary) hypertension: Secondary | ICD-10-CM | POA: Diagnosis not present

## 2021-02-18 DIAGNOSIS — Z79811 Long term (current) use of aromatase inhibitors: Secondary | ICD-10-CM | POA: Diagnosis not present

## 2021-02-18 LAB — CBC WITH DIFFERENTIAL/PLATELET
Abs Immature Granulocytes: 0.01 10*3/uL (ref 0.00–0.07)
Basophils Absolute: 0.1 10*3/uL (ref 0.0–0.1)
Basophils Relative: 1 %
Eosinophils Absolute: 0.2 10*3/uL (ref 0.0–0.5)
Eosinophils Relative: 3 %
HCT: 43.9 % (ref 36.0–46.0)
Hemoglobin: 13.8 g/dL (ref 12.0–15.0)
Immature Granulocytes: 0 %
Lymphocytes Relative: 16 %
Lymphs Abs: 1 10*3/uL (ref 0.7–4.0)
MCH: 29.6 pg (ref 26.0–34.0)
MCHC: 31.4 g/dL (ref 30.0–36.0)
MCV: 94.2 fL (ref 80.0–100.0)
Monocytes Absolute: 0.4 10*3/uL (ref 0.1–1.0)
Monocytes Relative: 6 %
Neutro Abs: 4.7 10*3/uL (ref 1.7–7.7)
Neutrophils Relative %: 74 %
Platelets: 172 10*3/uL (ref 150–400)
RBC: 4.66 MIL/uL (ref 3.87–5.11)
RDW: 14.1 % (ref 11.5–15.5)
WBC: 6.4 10*3/uL (ref 4.0–10.5)
nRBC: 0 % (ref 0.0–0.2)

## 2021-02-18 LAB — COMPREHENSIVE METABOLIC PANEL
ALT: 14 U/L (ref 0–44)
AST: 17 U/L (ref 15–41)
Albumin: 3.6 g/dL (ref 3.5–5.0)
Alkaline Phosphatase: 66 U/L (ref 38–126)
Anion gap: 9 (ref 5–15)
BUN: 12 mg/dL (ref 8–23)
CO2: 25 mmol/L (ref 22–32)
Calcium: 9.2 mg/dL (ref 8.9–10.3)
Chloride: 106 mmol/L (ref 98–111)
Creatinine, Ser: 1.32 mg/dL — ABNORMAL HIGH (ref 0.44–1.00)
GFR, Estimated: 40 mL/min — ABNORMAL LOW (ref >60)
Glucose, Bld: 115 mg/dL — ABNORMAL HIGH (ref 70–99)
Potassium: 3.7 mmol/L (ref 3.5–5.1)
Sodium: 140 mmol/L (ref 135–145)
Total Bilirubin: 0.6 mg/dL (ref 0.3–1.2)
Total Protein: 6.2 g/dL — ABNORMAL LOW (ref 6.5–8.1)

## 2021-02-18 LAB — TSH: TSH: 0.223 u[IU]/mL — ABNORMAL LOW (ref 0.350–4.500)

## 2021-02-18 MED ORDER — SODIUM CHLORIDE 0.9 % IV SOLN
Freq: Once | INTRAVENOUS | Status: AC
Start: 2021-02-18 — End: 2021-02-18

## 2021-02-18 MED ORDER — SODIUM CHLORIDE 0.9% FLUSH
10.0000 mL | INTRAVENOUS | Status: DC | PRN
  Administered 2021-02-18: 10 mL via INTRAVENOUS

## 2021-02-18 MED ORDER — HEPARIN SOD (PORK) LOCK FLUSH 100 UNIT/ML IV SOLN
500.0000 [IU] | Freq: Once | INTRAVENOUS | Status: AC | PRN
  Administered 2021-02-18: 500 [IU]

## 2021-02-18 MED ORDER — SODIUM CHLORIDE 0.9% FLUSH
10.0000 mL | INTRAVENOUS | Status: DC | PRN
  Administered 2021-02-18: 10 mL

## 2021-02-18 MED ORDER — SODIUM CHLORIDE 0.9 % IV SOLN
200.0000 mg | Freq: Once | INTRAVENOUS | Status: AC
  Administered 2021-02-18: 200 mg via INTRAVENOUS
  Filled 2021-02-18: qty 8

## 2021-02-18 NOTE — Patient Instructions (Addendum)
[place] at [facility] Discharge Instructions  You were seen today by Dr. Delton Coombes. He went over your recent results. You received your treatment today; continue getting your treatment every 3 weeks. Take vitamin D 50,000 units once a week and take a stool softener daily to prevent constipation. Apply a moisturizing lotion on your dry skin twice daily. Dr. Delton Coombes will see you back in 6 weeks for labs and follow up.   Thank you for choosing [place] at [facility] to provide your oncology and hematology care.  To afford each patient quality time with our provider, please arrive at least 15 minutes before your scheduled appointment time.   If you have a lab appointment with the [place] please come in thru the Main Entrance and check in at the main information desk  You need to re-schedule your appointment should you arrive 10 or more minutes late.  We strive to give you quality time with our providers, and arriving late affects you and other patients whose appointments are after yours.  Also, if you no show three or more times for appointments you may be dismissed from the clinic at the providers discretion.     Again, thank you for choosing [facility].  Our hope is that these requests will decrease the amount of time that you wait before being seen by our physicians.       _____________________________________________________________  Should you have questions after your visit to [facility], please contact our office at [phone] between the hours of 8:00 a.m. and 4:30 p.m.  Voicemails left after 4:00 p.m. will not be returned until the following business day.  For prescription refill requests, have your pharmacy contact our office and allow 72 hours.    Cancer Center Support Programs:   > Cancer Support Group  2nd Tuesday of the month 1pm-2pm, Journey Room

## 2021-02-18 NOTE — Progress Notes (Signed)
Pt here for [place]. Pt is having some nausea and constipation.  Okay for treatment today per Dr Raliegh Ip.   Pt tolerated treatment well today without incidence today and stable at discharge. AVS reviewed.  Vital signs stable prior to discharge. Discharged ambulatory.

## 2021-02-18 NOTE — Progress Notes (Signed)
[place], [place] [address]   CLINIC:  Medical Oncology/Hematology  PCP:  Antony Contras, MD [address] / [employer] [place] [address] [phone]   REASON FOR VISIT:  Follow-up for stage I right breast cancer and colon cancer  PRIOR THERAPY:  1. Robotic proximal colectomy on 10/11/2019. 2. Right breast lumpectomy and SLNB on 04/02/2020.  NGS Results: MSI--high, Foundation 1 not sent  CURRENT THERAPY: Keytruda every 3 weeks  BRIEF ONCOLOGIC HISTORY:  Oncology History  Cancer of ascending colon s/p robotic proximal colectomy 10/11/2019  10/11/2019 Initial Diagnosis   Cancer of ascending colon s/p robotic proximal colectomy 10/11/2019   10/23/2019 Cancer Staging   Staging form: Colon and Rectum, AJCC 8th Edition - Clinical stage from 10/23/2019: Stage IVA (cT3, cN1c, cM1a) - Signed by Derek Jack, MD on 12/14/2019   05/14/2020 -  Chemotherapy    Patient is on Treatment Plan: COLORECTAL PEMBROLIZUMAB Q21D      Malignant neoplasm of right female breast ([place])  04/23/2020 Initial Diagnosis   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 [facility])   04/23/2020 Cancer Staging   Staging form: Breast, AJCC 8th Edition - Clinical stage from 04/23/2020: Stage IA (cT1c, cN0(sn), cM0, G2, ER+, PR+, HER2-) - Signed by Derek Jack, MD on 04/23/2020     CANCER STAGING: Cancer Staging Cancer of ascending colon s/p robotic proximal colectomy 10/11/2019 Staging form: Colon and Rectum, AJCC 8th Edition - Clinical stage from 10/23/2019: Stage IVA (cT3, cN1c, cM1a) - Signed by Derek Jack, MD on 12/14/2019  Malignant neoplasm of right female breast [facility]) Staging form: Breast, AJCC 8th Edition - Clinical stage from 04/23/2020: Stage IA (cT1c, cN0(sn), cM0, G2, ER+, PR+, HER2-) - Signed by Derek Jack, MD on 04/23/2020   INTERVAL HISTORY:  [name], a 80 y.o. female, returns for routine  follow-up and consideration for next cycle of immunotherapy. Jessica Taylor was last seen on 01/07/2021.  Due for cycle #14 of Keytruda today.   Overall, she tells me she has been feeling okay. She notes that she continues having constipation from her treatment and will have hematochezia if she passes hard stool; her last episode of hematochezia was 2-3 weeks ago. She will buy a stool softener to take daily. Her energy levels are decreased since starting treatment but stable. She has been having multiple skin sores on her back since starting Keytruda which are itchy and she scratches; she just purchased Cerave to apply to her dry skin. She continues taking Arimidex daily and takes Norco PRN for bilateral knees pain, but has not started vitamin D.  Overall, she feels ready for next cycle of immunotherapy today.    REVIEW OF SYSTEMS:  Review of Systems  Constitutional: Positive for appetite change (50%) and fatigue (50%).  Gastrointestinal: Positive for blood in stool (w/ hard stool), constipation (from Tx) and nausea (intermittent).  Skin: Positive for itching (on sores) and wound (skin sores).  All other systems reviewed and are negative.   PAST MEDICAL/SURGICAL HISTORY:  Past Medical History:  Diagnosis Date  . Abdominal aortic aneurysm ([place])   . Acid reflux   . Anemia   . Arthritis    knees , R shoulder - tx /w injection - 11/2014  . Basal cell carcinoma (BCC) of dorsum of nose 2010   Resolved  . Cancer ([place])    basal cell on nose  . Colon cancer ([place])   . Coronary artery disease   .  Hypercholesteremia   . Hypertension   . Hypothyroidism   . Lt Acute pyelonephritis 09/11/2018  . 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 Peripheral arterial disease (HCC)    hhigh-grade ostial bilateral calcified iliac stenosis with claudication  . Tobacco abuse   . Vertigo    when lays on left side.   Past Surgical History:  Procedure Laterality Date  . BREAST LUMPECTOMY WITH RADIOACTIVE SEED AND SENTINEL LYMPH NODE BIOPSY  Bilateral 04/02/2020   Procedure: BILATERAL BREAST LUMPECTOMY WITH RADIOACTIVE SEED AND RIGHT SENTINEL LYMPH NODE BIOPSY AND RIGHT TARGETED AXILLARY LYMPH NODE BIOPSY;  Surgeon: Erroll Luna, MD;  Location: [place];  Service: General;  Laterality: Bilateral;  . CARDIAC CATHETERIZATION  5 stents  . CARDIAC CATHETERIZATION N/A 01/20/2016   Procedure: Left Heart Cath and Coronary Angiography;  Surgeon: Peter M Martinique, MD;  Location: [place] CV LAB;  Service: Cardiovascular;  Laterality: N/A;  . CATARACT EXTRACTION W/PHACO Left 05/24/2014   Procedure: CATARACT EXTRACTION PHACO AND INTRAOCULAR LENS PLACEMENT (IOC);  Surgeon: Tonny Branch, MD;  Location: [facility];  Service: Ophthalmology;  Laterality: Left;  CDE:  9.30  . CATARACT EXTRACTION W/PHACO Right 06/18/2014   Procedure: CATARACT EXTRACTION PHACO AND INTRAOCULAR LENS PLACEMENT RIGHT EYE CDE=10.84;  Surgeon: Tonny Branch, MD;  Location: [facility];  Service: Ophthalmology;  Laterality: Right;  . CORONARY ARTERY BYPASS GRAFT N/A 01/24/2016   Procedure: CORONARY ARTERY BYPASS GRAFTING (CABG);  Surgeon: Gaye Pollack, MD;  Location: [place];  Service: Open Heart Surgery;  Laterality: N/A;  . CORONARY STENT PLACEMENT    . CORONARY STENT PLACEMENT  03/06/15   CFX DES  . CYSTOSCOPY/URETEROSCOPY/HOLMIUM LASER/STENT PLACEMENT Left 09/12/2018   Procedure: CYSTOSCOPY/URETEROSCOPY/STENT PLACEMENT;  Surgeon: Ceasar Mons, MD;  Location: [facility];  Service: Urology;  Laterality: Left;  . EYE SURGERY    . LEFT HEART CATH AND CORS/GRAFTS ANGIOGRAPHY N/A 09/12/2019   Procedure: LEFT HEART CATH AND CORS/GRAFTS ANGIOGRAPHY;  Surgeon: Troy Sine, MD;  Location: [place] [facility];  Service: Cardiovascular;  Laterality: N/A;  . LEFT HEART CATHETERIZATION WITH CORONARY ANGIOGRAM N/A 03/06/2015   Procedure: LEFT HEART CATHETERIZATION WITH CORONARY ANGIOGRAM;  Surgeon: Lorretta Harp, MD;  Location: [facility] CATH LAB;  Service: Cardiovascular;   Laterality: N/A;  . PARTIAL KNEE ARTHROPLASTY Right 02/04/2015   Procedure: UNICOMPARTMENTAL KNEE;  Surgeon: Dorna Leitz, MD;  Location: [place];  Service: Orthopedics;  Laterality: Right;  . PERIPHERAL VASCULAR CATHETERIZATION Bilateral 05/13/2015   Procedure: Lower Extremity Angiography;  Surgeon: Lorretta Harp, MD;  Location: [place] CV LAB;  Service: Cardiovascular;  Laterality: Bilateral;  . PERIPHERAL VASCULAR CATHETERIZATION N/A 05/13/2015   Procedure: Abdominal Aortogram;  Surgeon: Lorretta Harp, MD;  Location: [place] CV LAB;  Service: Cardiovascular;  Laterality: N/A;  . PERIPHERAL VASCULAR CATHETERIZATION Bilateral 06/27/2015   Procedure: Peripheral Vascular Intervention;  Surgeon: Lorretta Harp, MD;  Location: [place] CV LAB;  Service: Cardiovascular;  Laterality: Bilateral;  ILIACS  . PERIPHERAL VASCULAR CATHETERIZATION Bilateral 06/27/2015   Procedure: Peripheral Vascular Atherectomy;  Surgeon: Lorretta Harp, MD;  Location: [place] [facility];  Service: Cardiovascular;  Laterality: Bilateral;  . PORTACATH PLACEMENT Left 05/24/2020   Procedure: INSERTION PORT-A-CATH;  Surgeon: Aviva Signs, MD;  Location: [facility];  Service: General;  Laterality: Left;  . TEE WITHOUT CARDIOVERSION N/A 01/24/2016   Procedure: TRANSESOPHAGEAL ECHOCARDIOGRAM (TEE);  Surgeon: Gaye Pollack, MD;  Location: [place];  Service: Open Heart Surgery;  Laterality: N/A;  .  TONSILLECTOMY     age 48  . TUBAL LIGATION      SOCIAL HISTORY:  Social History   Socioeconomic History  . Marital status: Married    Spouse name: [name]  . Number of children: 4  . Years of education: Not on file  . Highest education level: Not on file  Occupational History  . Occupation: retired    Comment: worked as Presenter, broadcasting for [employer]  Tobacco Use  . Smoking status: Former Smoker    Packs/day: 1.50    Years: 42.00    Pack years: 63.00    Types: Cigarettes    Quit date: 05/19/1999    Years since  quitting: 21.7  . Smokeless tobacco: Never Used  Vaping Use  . Vaping Use: Never used  Substance and Sexual Activity  . Alcohol use: No  . Drug use: No  . Sexual activity: Yes    Birth control/protection: Surgical  Other Topics Concern  . Not on file  Social History Narrative  . Not on file   Social Determinants of Health   Financial Resource Strain: Medium Risk  . Difficulty of Paying Living Expenses: Somewhat hard  Food Insecurity: No Food Insecurity  . Worried About Charity fundraiser in the Last Year: Never true  . Ran Out of Food in the Last Year: Never true  Transportation Needs: No Transportation Needs  . Lack of Transportation (Medical): No  . Lack of Transportation (Non-Medical): No  Physical Activity: Inactive  . Days of Exercise per Week: 0 days  . Minutes of Exercise per Session: 0 min  Stress: No Stress Concern Present  . Feeling of Stress : Not at all  Social Connections: Moderately Isolated  . Frequency of Communication with Friends and Family: More than three times a week  . Frequency of Social Gatherings with Friends and Family: Three times a week  . Attends Religious Services: Never  . Active Member of Clubs or Organizations: No  . Attends Archivist Meetings: Never  . Marital Status: Married  Human resources officer Violence: Not At Risk  . Fear of Current or Ex-Partner: No  . Emotionally Abused: No  . Physically Abused: No  . Sexually Abused: No    FAMILY HISTORY:  Family History  Problem Relation Age of Onset  . Ovarian cancer Mother 79  . Cancer Father 89       unsure of which kind, "it was in his glands"  . Hypertension Maternal Grandmother   . Stroke Maternal Grandfather   . Hypertension Son   . Brain cancer Sister   . Hypertension Daughter   . Heart attack Neg Hx   . Colon cancer Neg Hx   . Esophageal cancer Neg Hx   . Inflammatory bowel disease Neg Hx   . Liver disease Neg Hx   . Pancreatic cancer Neg Hx   . Rectal cancer Neg  Hx   . Stomach cancer Neg Hx     CURRENT MEDICATIONS:  Current Outpatient Medications  Medication Sig Dispense Refill  . anastrozole (ARIMIDEX) 1 MG tablet TAKE 1 TABLET BY MOUTH ONCE DAILY. 30 tablet 6  . aspirin EC 81 MG tablet Take 81 mg by mouth every evening.     Marland Kitchen atorvastatin (LIPITOR) 40 MG tablet TAKE (1) TABLET BY MOUTH ONCE DAILY. 90 tablet 0  . clopidogrel (PLAVIX) 75 MG tablet TAKE (1) TABLET BY MOUTH ONCE DAILY. 90 tablet 3  . ergocalciferol (VITAMIN D2) 1.25 MG (50000 UT) capsule  Take 1 capsule (50,000 Units total) by mouth once a week. 4 capsule 4  . esomeprazole (NEXIUM) 20 MG capsule Take 20 mg by mouth daily.     Marland Kitchen HYDROcodone-acetaminophen (NORCO/VICODIN) 5-325 MG tablet     . levothyroxine (SYNTHROID, LEVOTHROID) 50 MCG tablet Take 50 mcg by mouth daily before breakfast.    . metoprolol tartrate (LOPRESSOR) 25 MG tablet Take 1 tablet (25 mg total) by mouth 2 (two) times daily. 180 tablet 3  . olmesartan (BENICAR) 5 MG tablet 1 tablet    . Pembrolizumab (KEYTRUDA IV) Inject 200 mg into the vein every 21 ( twenty-one) days.     Marland Kitchen lidocaine-prilocaine (EMLA) cream Apply to affected area once (Patient not taking: Reported on 02/18/2021) 30 g 3  . nitroGLYCERIN (NITROSTAT) 0.4 MG SL tablet Place 1 tablet (0.4 mg total) under the tongue every 5 (five) minutes as needed for chest pain. (Patient not taking: Reported on 02/18/2021) 25 tablet prn  . prochlorperazine (COMPAZINE) 10 MG tablet Take 1 tablet (10 mg total) by mouth every 6 (six) hours as needed (Nausea or vomiting). (Patient not taking: Reported on 02/18/2021) 30 tablet 1   No current facility-administered medications for this visit.    ALLERGIES:  Allergies  Allergen Reactions  . Crab [Shellfish Allergy] Nausea And Vomiting    Throws up violently  . Ezetimibe Diarrhea  . Other Nausea And Vomiting    Shrimp    PHYSICAL EXAM:  Performance status (ECOG): 1 - Symptomatic but completely ambulatory  Vitals:    02/18/21 1045  BP: (!) 151/70  Pulse: (!) 52  Resp: 16  Temp: (!) 97 F (36.1 C)  SpO2: 96%   Wt Readings from Last 3 Encounters:  02/18/21 158 lb 9.6 oz (71.9 kg)  02/18/21 158 lb 12.8 oz (72 kg)  01/28/21 158 lb 1.6 oz (71.7 kg)   Physical Exam Vitals reviewed.  Constitutional:      Appearance: Normal appearance.  Cardiovascular:     Rate and Rhythm: Normal rate and regular rhythm.     Pulses: Normal pulses.     Heart sounds: Normal heart sounds.  Pulmonary:     Effort: Pulmonary effort is normal.     Breath sounds: Normal breath sounds.  Chest:  Breasts:     Left: Axillary adenopathy (stable size) present.      Comments: Port-a-Cath in L chest Abdominal:     Palpations: Abdomen is soft. There is no hepatomegaly, splenomegaly or mass.     Tenderness: There is no abdominal tenderness.     Hernia: No hernia is present.  Musculoskeletal:     Right lower leg: No edema.     Left lower leg: No edema.  Lymphadenopathy:     Upper Body:     Left upper body: Axillary adenopathy (stable size) present.     Lower Body: No right inguinal adenopathy. No left inguinal adenopathy.  Skin:    General: Skin is dry.     Findings: Wound (sores on lower back) present.     Comments: Skin nodule on R anterior chest wall  Neurological:     General: No focal deficit present.     Mental Status: She is alert and oriented to person, place, and time.  Psychiatric:        Mood and Affect: Mood normal.        Behavior: Behavior normal.     LABORATORY DATA:  I have reviewed the labs as listed.  CBC Latest Ref Rng &  Units 02/18/2021 01/28/2021 01/07/2021  WBC 4.0 - 10.5 K/uL 6.4 5.2 6.8  Hemoglobin 12.0 - 15.0 g/dL 13.8 14.2 14.2  Hematocrit 36.0 - 46.0 % 43.9 45.3 45.4  Platelets 150 - 400 K/uL 172 167 195   CMP Latest Ref Rng & Units 02/18/2021 01/28/2021 01/07/2021  Glucose 70 - 99 mg/dL 115(H) 117(H) 125(H)  BUN 8 - 23 mg/dL _0 Creatinine 0.44 - 1.00 mg/dL 1.32(H) 1.40(H) 1.54(H)   Sodium 135 - 145 mmol/L 140 137 137  Potassium 3.5 - 5.1 mmol/L 3.7 3.7 3.7  Chloride 98 - 111 mmol/L 106 104 105  CO2 22 - 32 mmol/L _1 Calcium 8.9 - 10.3 mg/dL 9.2 9.6 9.7  Total Protein 6.5 - 8.1 g/dL 6.2(L) 6.7 6.9  Total Bilirubin 0.3 - 1.2 mg/dL 0.6 1.0 0.7  Alkaline Phos 38 - 126 U/L 66 66 69  AST 15 - 41 U/L _2 ALT 0 - 44 U/L _3 Lab Results  Component Value Date   CEA1 9.5 (H) 01/28/2021   CEA1 9.0 (H) 11/26/2020   CEA1 9.7 (H) 10/14/2020    DIAGNOSTIC IMAGING:  I have independently reviewed the scans and discussed with the patient. No results found.   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 Right hemicolectomy on 10/11/2019, grade 3 adenocarcinoma, negative margins, positive LVSI, 2 tumor deposits, 0/15 lymph nodes positive, PT3PN1C. -MMR with loss of nuclear expression. MSI-high. MLH1 hyper methylation present. -PET scan in December 2020 showed lymph node in the right axillary region. Biopsy consistent with metastatic colon cancer. -Last CEA was 10.7 on 11/22/2019. -Right axillary lymph node excision on 04/02/2020 consistent with metastatic carcinoma from colon cancer. -Pembrolizumab started on 05/14/2020. -PET scan on 08/05/2020 shows complete resolution of lymphadenopathy. No evidence of new areas of uptake. Mild vague residual hypermetabolism in the right axilla without soft tissue mass. -PET scan on 12/23/2020 with no evidence of recurrence.  2.Stage I (PT1CPN0) right Breast, Grade 2 Invasive Lobular Carcinoma: -MRI of the breast on 12/25/2019 showed 1 cm mass behind the right areola with a suspicious mass in the left breast. -Right breast lumpectomy on 04/02/2020 shows invasive lobular carcinoma, grade 2, 1.2 cm. Resection margins are negative. Negative LVSI. 2 sentinel lymph nodes were negative for carcinoma. ER/PR 100%  positive, HER-2 negative, Ki-67 10%.   PLAN:  1.Stage IV colon cancer to the right axillary lymph node: -PET scan on 12/23/2020 did not show any evidence of recurrence or metastatic disease. -Reviewed her labs.  CEA has gone up to 11.4 from 9.53 weeks ago.  We will closely monitor.  LFTs are normal with normal CBC.  TSH was low at 0.2.  Will consider checking free T4 and anti-TPO antibodies. -She will proceed with her treatment today.  RTC 6 weeks for follow-up.  2.Right breast invasive lobular carcinoma, grade 2: -Continue anastrozole.  3.Osteoporosis: -DEXA scan showed T score -2.6. -Vitamin D level was low.  Continue vitamin D 50,000 units weekly.  4. Bladder cancer: -Continue follow-up with urology at [facility].  5. Elevated creatinine: -Creatinine is 1.32 and stable.   Orders placed this encounter:  Orders Placed This Encounter  Procedures  . CBC with Differential/Platelet  . Comprehensive metabolic panel  . Magnesium  . TSH     Derek Jack, MD [place] [phone]   I, Milinda Antis, am acting as  a scribe for Dr. Sanda Linger.  I, Derek Jack MD, have reviewed the above documentation for accuracy and completeness, and I agree with the above.

## 2021-02-18 NOTE — Patient Instructions (Signed)
[facility] Discharge Instructions for Patients Receiving Chemotherapy  Today you received the following chemotherapy agents keytruda.  Return as scheduled.  Please call the clinic if you have any questions or concerns.   To help prevent nausea and vomiting after your treatment, we encourage you to take your nausea medication    If you develop nausea and vomiting that is not controlled by your nausea medication, call the clinic.   BELOW ARE SYMPTOMS THAT SHOULD BE REPORTED IMMEDIATELY:  *FEVER GREATER THAN 100.5 F  *CHILLS WITH OR WITHOUT FEVER  NAUSEA AND VOMITING THAT IS NOT CONTROLLED WITH YOUR NAUSEA MEDICATION  *UNUSUAL SHORTNESS OF BREATH  *UNUSUAL BRUISING OR BLEEDING  TENDERNESS IN MOUTH AND THROAT WITH OR WITHOUT PRESENCE OF ULCERS  *URINARY PROBLEMS  *BOWEL PROBLEMS  UNUSUAL RASH Items with * indicate a potential emergency and should be followed up as soon as possible.  Feel free to call the clinic should you have any questions or concerns. The clinic phone number is [phone].  Please show the [place] at check-in to the Emergency Department and triage nurse.

## 2021-02-18 NOTE — Progress Notes (Signed)
Patient was assessed by Dr. Katragadda and labs have been reviewed.  Patient is okay to proceed with treatment today. Primary RN and pharmacy aware.   

## 2021-02-18 NOTE — Progress Notes (Signed)
Patients port flushed without difficulty.  Good blood return noted with no bruising or swelling noted at site.  Band aid applied.  VSS and patient in satisfactory condition.   

## 2021-02-19 LAB — CEA: CEA: 11.4 ng/mL — ABNORMAL HIGH (ref 0.0–4.7)

## 2021-02-20 ENCOUNTER — Other Ambulatory Visit (HOSPITAL_COMMUNITY): Payer: Self-pay

## 2021-02-20 DIAGNOSIS — C50911 Malignant neoplasm of unspecified site of right female breast: Secondary | ICD-10-CM

## 2021-02-20 DIAGNOSIS — C182 Malignant neoplasm of ascending colon: Secondary | ICD-10-CM

## 2021-03-12 ENCOUNTER — Other Ambulatory Visit: Payer: Self-pay

## 2021-03-12 ENCOUNTER — Inpatient Hospital Stay (HOSPITAL_COMMUNITY): Payer: Medicare Other

## 2021-03-12 ENCOUNTER — Inpatient Hospital Stay (HOSPITAL_COMMUNITY): Payer: Medicare Other | Attending: Hematology

## 2021-03-12 ENCOUNTER — Encounter (HOSPITAL_COMMUNITY): Payer: Self-pay

## 2021-03-12 VITALS — BP 180/70 | HR 73 | Temp 97.2°F | Resp 18

## 2021-03-12 DIAGNOSIS — C50911 Malignant neoplasm of unspecified site of right female breast: Secondary | ICD-10-CM

## 2021-03-12 DIAGNOSIS — Z5112 Encounter for antineoplastic immunotherapy: Secondary | ICD-10-CM | POA: Diagnosis not present

## 2021-03-12 DIAGNOSIS — C182 Malignant neoplasm of ascending colon: Secondary | ICD-10-CM | POA: Insufficient documentation

## 2021-03-12 DIAGNOSIS — C773 Secondary and unspecified malignant neoplasm of axilla and upper limb lymph nodes: Secondary | ICD-10-CM | POA: Diagnosis not present

## 2021-03-12 DIAGNOSIS — R102 Pelvic and perineal pain: Secondary | ICD-10-CM

## 2021-03-12 LAB — CBC WITH DIFFERENTIAL/PLATELET
Abs Immature Granulocytes: 0.03 10*3/uL (ref 0.00–0.07)
Basophils Absolute: 0 10*3/uL (ref 0.0–0.1)
Basophils Relative: 0 %
Eosinophils Absolute: 0 10*3/uL (ref 0.0–0.5)
Eosinophils Relative: 0 %
HCT: 44.2 % (ref 36.0–46.0)
Hemoglobin: 14 g/dL (ref 12.0–15.0)
Immature Granulocytes: 0 %
Lymphocytes Relative: 8 %
Lymphs Abs: 0.7 10*3/uL (ref 0.7–4.0)
MCH: 29.2 pg (ref 26.0–34.0)
MCHC: 31.7 g/dL (ref 30.0–36.0)
MCV: 92.3 fL (ref 80.0–100.0)
Monocytes Absolute: 0.6 10*3/uL (ref 0.1–1.0)
Monocytes Relative: 6 %
Neutro Abs: 7.9 10*3/uL — ABNORMAL HIGH (ref 1.7–7.7)
Neutrophils Relative %: 86 %
Platelets: 175 10*3/uL (ref 150–400)
RBC: 4.79 MIL/uL (ref 3.87–5.11)
RDW: 14.2 % (ref 11.5–15.5)
WBC: 9.2 10*3/uL (ref 4.0–10.5)
nRBC: 0 % (ref 0.0–0.2)

## 2021-03-12 LAB — COMPREHENSIVE METABOLIC PANEL
ALT: 13 U/L (ref 0–44)
AST: 14 U/L — ABNORMAL LOW (ref 15–41)
Albumin: 3.7 g/dL (ref 3.5–5.0)
Alkaline Phosphatase: 76 U/L (ref 38–126)
Anion gap: 9 (ref 5–15)
BUN: 11 mg/dL (ref 8–23)
CO2: 24 mmol/L (ref 22–32)
Calcium: 9.5 mg/dL (ref 8.9–10.3)
Chloride: 104 mmol/L (ref 98–111)
Creatinine, Ser: 1.19 mg/dL — ABNORMAL HIGH (ref 0.44–1.00)
GFR, Estimated: 46 mL/min — ABNORMAL LOW (ref >60)
Glucose, Bld: 122 mg/dL — ABNORMAL HIGH (ref 70–99)
Potassium: 3.8 mmol/L (ref 3.5–5.1)
Sodium: 137 mmol/L (ref 135–145)
Total Bilirubin: 1 mg/dL (ref 0.3–1.2)
Total Protein: 6.7 g/dL (ref 6.5–8.1)

## 2021-03-12 LAB — URINALYSIS, ROUTINE W REFLEX MICROSCOPIC
Bacteria, UA: NONE SEEN
Bilirubin Urine: NEGATIVE
Glucose, UA: NEGATIVE mg/dL
Ketones, ur: NEGATIVE mg/dL
Leukocytes,Ua: NEGATIVE
Nitrite: NEGATIVE
Protein, ur: NEGATIVE mg/dL
Specific Gravity, Urine: 1.016 (ref 1.005–1.030)
pH: 5 (ref 5.0–8.0)

## 2021-03-12 LAB — MAGNESIUM: Magnesium: 1.9 mg/dL (ref 1.7–2.4)

## 2021-03-12 MED ORDER — SODIUM CHLORIDE 0.9 % IV SOLN
200.0000 mg | Freq: Once | INTRAVENOUS | Status: AC
  Administered 2021-03-12: 200 mg via INTRAVENOUS
  Filled 2021-03-12: qty 8

## 2021-03-12 MED ORDER — HEPARIN SOD (PORK) LOCK FLUSH 100 UNIT/ML IV SOLN
500.0000 [IU] | Freq: Once | INTRAVENOUS | Status: AC | PRN
Start: 2021-03-12 — End: 2021-03-12
  Administered 2021-03-12: 500 [IU]

## 2021-03-12 MED ORDER — SODIUM CHLORIDE 0.9 % IV SOLN: Freq: Once | INTRAVENOUS | Status: AC

## 2021-03-12 NOTE — Patient Instructions (Signed)
Keytruda infusion today. Return as scheduled for office visit and infusion. Call the clinic should you continue to have nausea and intermittent pelvic pain.

## 2021-03-12 NOTE — Progress Notes (Signed)
Pt reports intermittent nausea and intermittent suprapubic pain onset yesterday.  Denies any urinary s/s.  Will obtain UA specimen to r/o UTI.    Tolerated infusion w/o adverse reaction.  Alert, in no distress.  VSS.  Discharged ambulatory in stable condition.

## 2021-04-02 ENCOUNTER — Other Ambulatory Visit: Payer: Self-pay

## 2021-04-02 ENCOUNTER — Other Ambulatory Visit (HOSPITAL_COMMUNITY): Payer: Self-pay | Admitting: *Deleted

## 2021-04-02 ENCOUNTER — Inpatient Hospital Stay (HOSPITAL_COMMUNITY): Payer: Medicare Other

## 2021-04-02 ENCOUNTER — Inpatient Hospital Stay (HOSPITAL_COMMUNITY): Payer: Medicare Other | Attending: Hematology

## 2021-04-02 ENCOUNTER — Inpatient Hospital Stay (HOSPITAL_BASED_OUTPATIENT_CLINIC_OR_DEPARTMENT_OTHER): Payer: Medicare Other | Admitting: Hematology

## 2021-04-02 VITALS — BP 159/64 | HR 73 | Temp 97.0°F | Resp 18

## 2021-04-02 VITALS — BP 183/89 | HR 61 | Temp 97.2°F | Resp 18 | Wt 158.1 lb

## 2021-04-02 DIAGNOSIS — I10 Essential (primary) hypertension: Secondary | ICD-10-CM | POA: Diagnosis not present

## 2021-04-02 DIAGNOSIS — Z87891 Personal history of nicotine dependence: Secondary | ICD-10-CM | POA: Insufficient documentation

## 2021-04-02 DIAGNOSIS — Z170 Estrogen receptor positive status [ER+]: Secondary | ICD-10-CM | POA: Diagnosis not present

## 2021-04-02 DIAGNOSIS — C182 Malignant neoplasm of ascending colon: Secondary | ICD-10-CM | POA: Diagnosis not present

## 2021-04-02 DIAGNOSIS — E0590 Thyrotoxicosis, unspecified without thyrotoxic crisis or storm: Secondary | ICD-10-CM

## 2021-04-02 DIAGNOSIS — Z7982 Long term (current) use of aspirin: Secondary | ICD-10-CM | POA: Diagnosis not present

## 2021-04-02 DIAGNOSIS — C50911 Malignant neoplasm of unspecified site of right female breast: Secondary | ICD-10-CM

## 2021-04-02 DIAGNOSIS — Z79811 Long term (current) use of aromatase inhibitors: Secondary | ICD-10-CM | POA: Diagnosis not present

## 2021-04-02 DIAGNOSIS — Z79899 Other long term (current) drug therapy: Secondary | ICD-10-CM | POA: Insufficient documentation

## 2021-04-02 DIAGNOSIS — C773 Secondary and unspecified malignant neoplasm of axilla and upper limb lymph nodes: Secondary | ICD-10-CM | POA: Diagnosis not present

## 2021-04-02 DIAGNOSIS — Z5112 Encounter for antineoplastic immunotherapy: Secondary | ICD-10-CM | POA: Diagnosis not present

## 2021-04-02 LAB — COMPREHENSIVE METABOLIC PANEL
ALT: 15 U/L (ref 0–44)
AST: 18 U/L (ref 15–41)
Albumin: 3.5 g/dL (ref 3.5–5.0)
Alkaline Phosphatase: 78 U/L (ref 38–126)
Anion gap: 8 (ref 5–15)
BUN: 17 mg/dL (ref 8–23)
CO2: 25 mmol/L (ref 22–32)
Calcium: 9.4 mg/dL (ref 8.9–10.3)
Chloride: 106 mmol/L (ref 98–111)
Creatinine, Ser: 1.34 mg/dL — ABNORMAL HIGH (ref 0.44–1.00)
GFR, Estimated: 40 mL/min — ABNORMAL LOW (ref >60)
Glucose, Bld: 139 mg/dL — ABNORMAL HIGH (ref 70–99)
Potassium: 3.7 mmol/L (ref 3.5–5.1)
Sodium: 139 mmol/L (ref 135–145)
Total Bilirubin: 0.8 mg/dL (ref 0.3–1.2)
Total Protein: 6.5 g/dL (ref 6.5–8.1)

## 2021-04-02 LAB — CBC WITH DIFFERENTIAL/PLATELET
Abs Immature Granulocytes: 0.02 10*3/uL (ref 0.00–0.07)
Basophils Absolute: 0.1 10*3/uL (ref 0.0–0.1)
Basophils Relative: 1 %
Eosinophils Absolute: 0.1 10*3/uL (ref 0.0–0.5)
Eosinophils Relative: 2 %
HCT: 42.2 % (ref 36.0–46.0)
Hemoglobin: 13.2 g/dL (ref 12.0–15.0)
Immature Granulocytes: 0 %
Lymphocytes Relative: 21 %
Lymphs Abs: 1 10*3/uL (ref 0.7–4.0)
MCH: 29.6 pg (ref 26.0–34.0)
MCHC: 31.3 g/dL (ref 30.0–36.0)
MCV: 94.6 fL (ref 80.0–100.0)
Monocytes Absolute: 0.2 10*3/uL (ref 0.1–1.0)
Monocytes Relative: 5 %
Neutro Abs: 3.4 10*3/uL (ref 1.7–7.7)
Neutrophils Relative %: 71 %
Platelets: 182 10*3/uL (ref 150–400)
RBC: 4.46 MIL/uL (ref 3.87–5.11)
RDW: 14.6 % (ref 11.5–15.5)
WBC: 4.8 10*3/uL (ref 4.0–10.5)
nRBC: 0 % (ref 0.0–0.2)

## 2021-04-02 LAB — TSH: TSH: 0.261 u[IU]/mL — ABNORMAL LOW (ref 0.350–4.500)

## 2021-04-02 LAB — T4, FREE: Free T4: 0.92 ng/dL (ref 0.61–1.12)

## 2021-04-02 MED ORDER — SODIUM CHLORIDE 0.9% FLUSH
10.0000 mL | INTRAVENOUS | Status: DC | PRN
  Administered 2021-04-02: 10 mL

## 2021-04-02 MED ORDER — SODIUM CHLORIDE 0.9 % IV SOLN
200.0000 mg | Freq: Once | INTRAVENOUS | Status: AC
  Administered 2021-04-02: 200 mg via INTRAVENOUS
  Filled 2021-04-02: qty 8

## 2021-04-02 MED ORDER — HEPARIN SOD (PORK) LOCK FLUSH 100 UNIT/ML IV SOLN
500.0000 [IU] | Freq: Once | INTRAVENOUS | Status: AC | PRN
  Administered 2021-04-02: 500 [IU]

## 2021-04-02 MED ORDER — SODIUM CHLORIDE 0.9 % IV SOLN: Freq: Once | INTRAVENOUS | Status: AC

## 2021-04-02 MED ORDER — HYDROXYZINE HCL 25 MG PO TABS
25.0000 mg | ORAL_TABLET | Freq: Every evening | ORAL | 3 refills | Status: DC | PRN
Start: 2021-04-02 — End: 2021-08-15

## 2021-04-02 NOTE — Progress Notes (Signed)
[facility] [address], [place] [address]   CLINIC:  Medical Oncology/Hematology  PCP:  Tally Joe, MD [address] / [place] [address] [phone]   REASON FOR VISIT:  Follow-up for stage I right breast cancer and colon cancer  PRIOR THERAPY:  1. Robotic proximal colectomy on 10/11/2019. 2. Right breast lumpectomy and SLNB on 04/02/2020.  NGS Results: MSI--high, Foundation 1 not sent  CURRENT THERAPY: Keytruda every 3 weeks  BRIEF ONCOLOGIC HISTORY:  Oncology History  Cancer of ascending colon s/p robotic proximal colectomy 10/11/2019  10/11/2019 Initial Diagnosis   Cancer of ascending colon s/p robotic proximal colectomy 10/11/2019   10/23/2019 Cancer Staging   Staging form: Colon and Rectum, AJCC 8th Edition - Clinical stage from 10/23/2019: Stage IVA (cT3, cN1c, cM1a) - Signed by Doreatha Massed, MD on 12/14/2019   05/14/2020 -  Chemotherapy    Patient is on Treatment Plan: COLORECTAL PEMBROLIZUMAB Q21D      Malignant neoplasm of right female breast (HCC)  04/23/2020 Initial Diagnosis   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 [facility])   04/23/2020 Cancer Staging   Staging form: Breast, AJCC 8th Edition - Clinical stage from 04/23/2020: Stage IA (cT1c, cN0(sn), cM0, G2, ER+, PR+, HER2-) - Signed by Doreatha Massed, MD on 04/23/2020     CANCER STAGING: Cancer Staging Cancer of ascending colon s/p robotic proximal colectomy 10/11/2019 Staging form: Colon and Rectum, AJCC 8th Edition - Clinical stage from 10/23/2019: Stage IVA (cT3, cN1c, cM1a) - Signed by Doreatha Massed, MD on 12/14/2019  Malignant neoplasm of right female breast [facility]) Staging form: Breast, AJCC 8th Edition - Clinical stage from 04/23/2020: Stage IA (cT1c, cN0(sn), cM0, G2, ER+, PR+, HER2-) - Signed by Doreatha Massed, MD on 04/23/2020   INTERVAL HISTORY:  [name], a 80 y.o. female, returns for routine  follow-up and consideration for next cycle of immunotherapy. Jessica Taylor was last seen on 02/18/2021.  Due for cycle #16 of [place] today.   Overall, she tells me she has been feeling pretty well. She reports constant itching and a resolved blister on her leg after Tx. She has not taken any benadryl or other medication to treat the itch. There is no rash present. She is not taking vitamin D, but continues taking her other medications. She denies diarrhea and SOB. She uses CeraVe anti-itch moisturizer which helps the itch; she applies daily. Reports bowel movement 1x a day.    Overall, she feels ready for next cycle of immunotherapy today.    REVIEW OF SYSTEMS:  Review of Systems  Constitutional: Positive for appetite change (50%) and fatigue (50%).  Respiratory: Negative for shortness of breath.   Gastrointestinal: Negative for diarrhea.  Skin: Positive for itching. Negative for rash.  Psychiatric/Behavioral: Negative for sleep disturbance.  All other systems reviewed and are negative.   PAST MEDICAL/SURGICAL HISTORY:  Past Medical History:  Diagnosis Date  . Abdominal aortic aneurysm (HCC)   . Acid reflux   . Anemia   . Arthritis    knees , R shoulder - tx /w injection - 11/2014  . Basal cell carcinoma (BCC) of dorsum of nose 2010   Resolved  . Cancer (HCC)    basal cell on nose  . Colon cancer (HCC)   . Coronary artery disease   . Hypercholesteremia   . Hypertension   . Hypothyroidism   . Lt Acute pyelonephritis 09/11/2018  . MI, old 28  . Peripheral arterial  disease ([place])    hhigh-grade ostial bilateral calcified iliac stenosis with claudication  . Tobacco abuse   . Vertigo    when lays on left side.   Past Surgical History:  Procedure Laterality Date  . BREAST LUMPECTOMY WITH RADIOACTIVE SEED AND SENTINEL LYMPH NODE BIOPSY Bilateral 04/02/2020   Procedure: BILATERAL BREAST LUMPECTOMY WITH RADIOACTIVE SEED AND RIGHT SENTINEL LYMPH NODE BIOPSY AND RIGHT TARGETED AXILLARY  LYMPH NODE BIOPSY;  Surgeon: Erroll Luna, MD;  Location: [place];  Service: General;  Laterality: Bilateral;  . CARDIAC CATHETERIZATION  5 stents  . CARDIAC CATHETERIZATION N/A 01/20/2016   Procedure: Left Heart Cath and Coronary Angiography;  Surgeon: Peter M Martinique, MD;  Location: [place] [facility];  Service: Cardiovascular;  Laterality: N/A;  . CATARACT EXTRACTION W/PHACO Left 05/24/2014   Procedure: CATARACT EXTRACTION PHACO AND INTRAOCULAR LENS PLACEMENT (IOC);  Surgeon: Tonny Branch, MD;  Location: [facility];  Service: Ophthalmology;  Laterality: Left;  CDE:  9.30  . CATARACT EXTRACTION W/PHACO Right 06/18/2014   Procedure: CATARACT EXTRACTION PHACO AND INTRAOCULAR LENS PLACEMENT RIGHT EYE CDE=10.84;  Surgeon: Tonny Branch, MD;  Location: [facility];  Service: Ophthalmology;  Laterality: Right;  . CORONARY ARTERY BYPASS GRAFT N/A 01/24/2016   Procedure: CORONARY ARTERY BYPASS GRAFTING (CABG);  Surgeon: Gaye Pollack, MD;  Location: [place];  Service: Open Heart Surgery;  Laterality: N/A;  . CORONARY STENT PLACEMENT    . CORONARY STENT PLACEMENT  03/06/15   CFX DES  . CYSTOSCOPY/URETEROSCOPY/HOLMIUM LASER/STENT PLACEMENT Left 09/12/2018   Procedure: CYSTOSCOPY/URETEROSCOPY/STENT PLACEMENT;  Surgeon: Ceasar Mons, MD;  Location: [facility];  Service: Urology;  Laterality: Left;  . EYE SURGERY    . LEFT HEART CATH AND CORS/GRAFTS ANGIOGRAPHY N/A 09/12/2019   Procedure: LEFT HEART CATH AND CORS/GRAFTS ANGIOGRAPHY;  Surgeon: Troy Sine, MD;  Location: [place] [facility];  Service: Cardiovascular;  Laterality: N/A;  . LEFT HEART CATHETERIZATION WITH CORONARY ANGIOGRAM N/A 03/06/2015   Procedure: LEFT HEART CATHETERIZATION WITH CORONARY ANGIOGRAM;  Surgeon: Lorretta Harp, MD;  Location: [facility] C[facility] LAB;  Service: Cardiovascular;  Laterality: N/A;  . PARTIAL KNEE ARTHROPLASTY Right 02/04/2015   Procedure: UNICOMPARTMENTAL KNEE;  Surgeon: Dorna Leitz, MD;  Location: [place];  Service:  Orthopedics;  Laterality: Right;  . PERIPHERAL VASCULAR CATHETERIZATION Bilateral 05/13/2015   Procedure: Lower Extremity Angiography;  Surgeon: Lorretta Harp, MD;  Location: [place] [facility];  Service: Cardiovascular;  Laterality: Bilateral;  . PERIPHERAL VASCULAR CATHETERIZATION N/A 05/13/2015   Procedure: Abdominal Aortogram;  Surgeon: Lorretta Harp, MD;  Location: [place] [facility];  Service: Cardiovascular;  Laterality: N/A;  . PERIPHERAL VASCULAR CATHETERIZATION Bilateral 06/27/2015   Procedure: Peripheral Vascular Intervention;  Surgeon: Lorretta Harp, MD;  Location: [place] [facility];  Service: Cardiovascular;  Laterality: Bilateral;  ILIACS  . PERIPHERAL VASCULAR CATHETERIZATION Bilateral 06/27/2015   Procedure: Peripheral Vascular Atherectomy;  Surgeon: Lorretta Harp, MD;  Location: [place] [facility];  Service: Cardiovascular;  Laterality: Bilateral;  . PORTACATH PLACEMENT Left 05/24/2020   Procedure: INSERTION PORT-A-CATH;  Surgeon: Aviva Signs, MD;  Location: [facility];  Service: General;  Laterality: Left;  . TEE WITHOUT CARDIOVERSION N/A 01/24/2016   Procedure: TRANSESOPHAGEAL ECHOCARDIOGRAM (TEE);  Surgeon: Gaye Pollack, MD;  Location: [place];  Service: Open Heart Surgery;  Laterality: N/A;  . TONSILLECTOMY     age 31  . TUBAL LIGATION      SOCIAL HISTORY:  Social History   Socioeconomic History  .  Marital status: Married    Spouse name: [name]  . Number of children: 4  . Years of education: Not on file  . Highest education level: Not on file  Occupational History  . Occupation: retired    Comment: worked as Presenter, broadcasting for [employer]  Tobacco Use  . Smoking status: Former Smoker    Packs/day: 1.50    Years: 42.00    Pack years: 63.00    Types: Cigarettes    Quit date: 05/19/1999    Years since quitting: 21.8  . Smokeless tobacco: Never Used  Vaping Use  . Vaping Use: Never used  Substance and Sexual Activity  . Alcohol use: No  . Drug use:  No  . Sexual activity: Yes    Birth control/protection: Surgical  Other Topics Concern  . Not on file  Social History Narrative  . Not on file   Social Determinants of Health   Financial Resource Strain: Medium Risk  . Difficulty of Paying Living Expenses: Somewhat hard  Food Insecurity: No Food Insecurity  . Worried About Charity fundraiser in the Last Year: Never true  . Ran Out of Food in the Last Year: Never true  Transportation Needs: No Transportation Needs  . Lack of Transportation (Medical): No  . Lack of Transportation (Non-Medical): No  Physical Activity: Inactive  . Days of Exercise per Week: 0 days  . Minutes of Exercise per Session: 0 min  Stress: No Stress Concern Present  . Feeling of Stress : Not at all  Social Connections: Moderately Isolated  . Frequency of Communication with Friends and Family: More than three times a week  . Frequency of Social Gatherings with Friends and Family: Three times a week  . Attends Religious Services: Never  . Active Member of Clubs or Organizations: No  . Attends Archivist Meetings: Never  . Marital Status: Married  Human resources officer Violence: Not At Risk  . Fear of Current or Ex-Partner: No  . Emotionally Abused: No  . Physically Abused: No  . Sexually Abused: No    FAMILY HISTORY:  Family History  Problem Relation Age of Onset  . Ovarian cancer Mother 42  . Cancer Father 10       unsure of which kind, "it was in his glands"  . Hypertension Maternal Grandmother   . Stroke Maternal Grandfather   . Hypertension Son   . Brain cancer Sister   . Hypertension Daughter   . Heart attack Neg Hx   . Colon cancer Neg Hx   . Esophageal cancer Neg Hx   . Inflammatory bowel disease Neg Hx   . Liver disease Neg Hx   . Pancreatic cancer Neg Hx   . Rectal cancer Neg Hx   . Stomach cancer Neg Hx     CURRENT MEDICATIONS:  Current Outpatient Medications  Medication Sig Dispense Refill  . anastrozole (ARIMIDEX) 1 MG  tablet TAKE 1 TABLET BY MOUTH ONCE DAILY. 30 tablet 6  . aspirin EC 81 MG tablet Take 81 mg by mouth every evening.     Marland Kitchen atorvastatin (LIPITOR) 40 MG tablet TAKE (1) TABLET BY MOUTH ONCE DAILY. 90 tablet 0  . clopidogrel (PLAVIX) 75 MG tablet TAKE (1) TABLET BY MOUTH ONCE DAILY. 90 tablet 3  . esomeprazole (NEXIUM) 20 MG capsule Take 20 mg by mouth daily.     Marland Kitchen HYDROcodone-acetaminophen (NORCO/VICODIN) 5-325 MG tablet     . levothyroxine (SYNTHROID, LEVOTHROID) 50 MCG tablet Take 50 mcg  by mouth daily before breakfast.    . metoprolol tartrate (LOPRESSOR) 25 MG tablet Take 1 tablet (25 mg total) by mouth 2 (two) times daily. 180 tablet 3  . olmesartan (BENICAR) 5 MG tablet 1 tablet    . Pembrolizumab (KEYTRUDA IV) Inject 200 mg into the vein every 21 ( twenty-one) days.     . ergocalciferol (VITAMIN D2) 1.25 MG (50000 UT) capsule Take 1 capsule (50,000 Units total) by mouth once a week. (Patient not taking: Reported on 04/02/2021) 4 capsule 4  . lidocaine-prilocaine (EMLA) cream Apply to affected area once (Patient not taking: Reported on 04/02/2021) 30 g 3  . nitroGLYCERIN (NITROSTAT) 0.4 MG SL tablet Place 1 tablet (0.4 mg total) under the tongue every 5 (five) minutes as needed for chest pain. (Patient not taking: Reported on 04/02/2021) 25 tablet prn  . prochlorperazine (COMPAZINE) 10 MG tablet Take 1 tablet (10 mg total) by mouth every 6 (six) hours as needed (Nausea or vomiting). (Patient not taking: Reported on 04/02/2021) 30 tablet 1   No current facility-administered medications for this visit.    ALLERGIES:  Allergies  Allergen Reactions  . Crab [Shellfish Allergy] Nausea And Vomiting    Throws up violently  . Ezetimibe Diarrhea  . Other Nausea And Vomiting    Shrimp    PHYSICAL EXAM:  Performance status (ECOG): 1 - Symptomatic but completely ambulatory  Vitals:   04/02/21 1032  BP: (!) 183/89  Pulse: 61  Resp: 18  Temp: (!) 97.2 F (36.2 C)  SpO2: 96%   Wt Readings  from Last 3 Encounters:  04/02/21 158 lb 1.6 oz (71.7 kg)  03/12/21 157 lb 3.2 oz (71.3 kg)  02/18/21 158 lb 9.6 oz (71.9 kg)   Physical Exam Vitals reviewed.  Constitutional:      Appearance: Normal appearance. She is obese.  Cardiovascular:     Rate and Rhythm: Normal rate and regular rhythm.     Pulses: Normal pulses.     Heart sounds: Normal heart sounds.  Pulmonary:     Effort: Pulmonary effort is normal.     Breath sounds: Normal breath sounds.  Chest:  Breasts:     Right: No axillary adenopathy or supraclavicular adenopathy.     Left: Axillary adenopathy (stable and palpable LN) present. No supraclavicular adenopathy.    Abdominal:     Palpations: Abdomen is soft. There is no hepatomegaly, splenomegaly or mass.     Tenderness: There is no abdominal tenderness.  Lymphadenopathy:     Upper Body:     Right upper body: No supraclavicular, axillary or pectoral adenopathy.     Left upper body: Axillary adenopathy (stable and palpable LN) present. No supraclavicular or pectoral adenopathy.  Skin:    General: Skin is dry.     Findings: Abrasion (scratches from itching) present.  Neurological:     General: No focal deficit present.     Mental Status: She is alert and oriented to person, place, and time.  Psychiatric:        Mood and Affect: Mood normal.        Behavior: Behavior normal.     LABORATORY DATA:  I have reviewed the labs as listed.  CBC Latest Ref Rng & Units 04/02/2021 03/12/2021 02/18/2021  WBC 4.0 - 10.5 K/uL 4.8 9.2 6.4  Hemoglobin 12.0 - 15.0 g/dL 13.2 14.0 13.8  Hematocrit 36.0 - 46.0 % 42.2 44.2 43.9  Platelets 150 - 400 K/uL 182 175 172   CMP Latest Ref  Rng & Units 03/12/2021 02/18/2021 01/28/2021  Glucose 70 - 99 mg/dL 122(H) 115(H) 117(H)  BUN 8 - 23 mg/dL $Remove'11 12 15  'BxHFChB$ Creatinine 0.44 - 1.00 mg/dL 1.19(H) 1.32(H) 1.40(H)  Sodium 135 - 145 mmol/L 137 140 137  Potassium 3.5 - 5.1 mmol/L 3.8 3.7 3.7  Chloride 98 - 111 mmol/L 104 106 104  CO2 22 - 32  mmol/L $Remov'24 25 24  'RKrVpu$ Calcium 8.9 - 10.3 mg/dL 9.5 9.2 9.6  Total Protein 6.5 - 8.1 g/dL 6.7 6.2(L) 6.7  Total Bilirubin 0.3 - 1.2 mg/dL 1.0 0.6 1.0  Alkaline Phos 38 - 126 U/L 76 66 66  AST 15 - 41 U/L 14(L) 17 16  ALT 0 - 44 U/L $Remo'13 14 12    'ZJbnJ$ DIAGNOSTIC IMAGING:  I have independently reviewed the scans and discussed with the patient. No results found.   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 Right hemicolectomy on 10/11/2019, grade 3 adenocarcinoma, negative margins, positive LVSI, 2 tumor deposits, 0/15 lymph nodes positive, PT3PN1C. -MMR with loss of nuclear expression. MSI-high. MLH1 hyper methylation present. -PET scan in December 2020 showed lymph node in the right axillary region. Biopsy consistent with metastatic colon cancer. -Last CEA was 10.7 on 11/22/2019. -Right axillary lymph node excision on 04/02/2020 consistent with metastatic carcinoma from colon cancer. -Pembrolizumab started on 05/14/2020. -PET scan on 08/05/2020 shows complete resolution of lymphadenopathy. No evidence of new areas of uptake. Mild vague residual hypermetabolism in the right axilla without soft tissue mass. -PET scan on 12/23/2020 with no evidence of recurrence.  2.Stage I (PT1CPN0) right Breast, Grade 2 Invasive Lobular Carcinoma: -MRI of the breast on 12/25/2019 showed 1 cm mass behind the right areola with a suspicious mass in the left breast. -Right breast lumpectomy on 04/02/2020 shows invasive lobular carcinoma, grade 2, 1.2 cm. Resection margins are negative. Negative LVSI. 2 sentinel lymph nodes were negative for carcinoma. ER/PR 100% positive, HER-2 negative, Ki-67 10%.  PLAN:  1.Stage IV colon cancer to the right axillary lymph node: -PET scan on 12/23/2020 did not show any evidence of recurrence or metastatic disease. - Physical examination today consistent with single lymph  node palpable in the left axillary region.  No other lymphadenopathy or organomegaly. - She reported itching of her whole body on and off since last 3 weeks.  It is predominantly at nighttime. - Reviewed her labs which showed normal CBC and LFTs.  TSH was 0.261.  We will check free T4 and TPO antibodies. - She will proceed with her treatment today.  RTC 3 weeks for follow-up.  PET scan prior to next visit.  2.Right breast invasive lobular carcinoma, grade 2: -Continue anastrozole daily.  She is tolerating it very well.  3.Osteoporosis: -DEXA scan showed T score -2.6. - Continue vitamin D 50,000 units weekly.  4. Bladder cancer: -Continue follow-up with [facility]rology [facility].  5. Elevated creatinine: -Creatinine is 1.34 and stable.  6.  Generalized pruritus: - She reported itching most predominantly at nighttime in the last 3 to 4 weeks. - She does not have any rash.  She was told to use moisturizing lotion 2-3 times a day. - We will give Atarax 25 mg at bedtime.   Orders placed this encounter:  No orders of the defined types were placed in this encounter.    Derek Jack, MD [place] [phone]   I, Thana Ates, am acting as a  scribe for Dr. Sanda Linger.  I, Derek Jack MD, have reviewed the above documentation for accuracy and completeness, and I agree with the above.

## 2021-04-02 NOTE — Progress Notes (Signed)
Message received from [facility] RN/ Dr. Delton Coombes to proceed with treatment. Labs reviewed by MD. Vital signs reviewed by MD. BP on arrival 183/89. Recheck 167/71. Patient denies headache, blurred vision, or dizziness. Patient states, " I had to go back home and get my blood pressure medicine because I forgot to take it this morning."   Treatment given today per MD orders. Tolerated infusion without adverse affects. Vital signs stable. No complaints at this time. Discharged from clinic ambulatory in stable condition. Alert and oriented x 3. F/U with [facility] as scheduled.

## 2021-04-02 NOTE — Patient Instructions (Signed)
[facility]  Discharge Instructions: Thank you for choosing [facility] to provide your oncology and hematology care.  If you have a lab appointment with the Cancer Center, please come in thru the Main Entrance and check in at the main information desk.  Wear comfortable clothing and clothing appropriate for easy access to any Portacath or PICC line.   We strive to give you quality time with your provider. You may need to reschedule your appointment if you arrive late (15 or more minutes).  Arriving late affects you and other patients whose appointments are after yours.  Also, if you miss three or more appointments without notifying the office, you may be dismissed from the clinic at the provider's discretion.      For prescription refill requests, have your pharmacy contact our office and allow 72 hours for refills to be completed.    Today you received the following chemotherapy and/or immunotherapy agents Keytruda       To help prevent nausea and vomiting after your treatment, we encourage you to take your nausea medication as directed.  BELOW ARE SYMPTOMS THAT SHOULD BE REPORTED IMMEDIATELY: *FEVER GREATER THAN 100.4 F (38 C) OR HIGHER *CHILLS OR SWEATING *NAUSEA AND VOMITING THAT IS NOT CONTROLLED WITH YOUR NAUSEA MEDICATION *UNUSUAL SHORTNESS OF BREATH *UNUSUAL BRUISING OR BLEEDING *URINARY PROBLEMS (pain or burning when urinating, or frequent urination) *BOWEL PROBLEMS (unusual diarrhea, constipation, pain near the anus) TENDERNESS IN MOUTH AND THROAT WITH OR WITHOUT PRESENCE OF ULCERS (sore throat, sores in mouth, or a toothache) UNUSUAL RASH, SWELLING OR PAIN  UNUSUAL VAGINAL DISCHARGE OR ITCHING   Items with * indicate a potential emergency and should be followed up as soon as possible or go to the Emergency Department if any problems should occur.  Please show the CHEMOTHERAPY ALERT CARD or IMMUNOTHERAPY ALERT CARD at check-in to the Emergency  Department and triage nurse.  Should you have questions after your visit or need to cancel or reschedule your appointment, please contact [facility] [phone]  and follow the prompts.  Office hours are 8:00 a.m. to 4:30 p.m. Monday - Friday. Please note that voicemails left after 4:00 p.m. may not be returned until the following business day.  We are closed weekends and major holidays. You have access to a nurse at all times for urgent questions. Please call the main number to the clinic [phone] and follow the prompts.  For any non-urgent questions, you may also contact your provider using MyChart. We now offer e-Visits for anyone 18 and older to request care online for non-urgent symptoms. For details visit [email or web address].   Also download the MyChart app! Go to the app store, search "MyChart", open the app, select [facility], and log in with your MyChart username and password.  Due to Covid, a mask is required upon entering the hospital/clinic. If you do not have a mask, one will be given to you upon arrival. For doctor visits, patients may have 1 support person aged 18 or older with them. For treatment visits, patients cannot have anyone with them due to current Covid guidelines and our immunocompromised population.  

## 2021-04-02 NOTE — Progress Notes (Signed)
Patient reports taking anastrozole daily as prescribed and denies any side effects.

## 2021-04-02 NOTE — Patient Instructions (Addendum)
[place] at [facility] Discharge Instructions  You were seen today by Dr. Delton Coombes. He went over your recent results. You received your treatment today. You will be scheduled for a PET scan prior to your next visit. Begin taking Atrax before bedtime to help with your itching. Dr. Delton Coombes will see you back in 3 weeks for labs and follow up.   Thank you for choosing [place] at [facility] to provide your oncology and hematology care.  To afford each patient quality time with our provider, please arrive at least 15 minutes before your scheduled appointment time.   If you have a lab appointment with the [facility] please come in thru the Main Entrance and check in at the main information desk  You need to re-schedule your appointment should you arrive 10 or more minutes late.  We strive to give you quality time with our providers, and arriving late affects you and other patients whose appointments are after yours.  Also, if you no show three or more times for appointments you may be dismissed from the clinic at the providers discretion.     Again, thank you for choosing [facility].  Our hope is that these requests will decrease the amount of time that you wait before being seen by our physicians.       _____________________________________________________________  Should you have questions after your visit to [facility], please contact our office at [phone] between the hours of 8:00 a.m. and 4:30 p.m.  Voicemails left after 4:00 p.m. will not be returned until the following business day.  For prescription refill requests, have your pharmacy contact our office and allow 72 hours.    Cancer Center Support Programs:   > Cancer Support Group  2nd Tuesday of the month 1pm-2pm, Journey Room

## 2021-04-03 LAB — THYROID PEROXIDASE ANTIBODY: Thyroperoxidase Ab SerPl-aCnc: <8 IU/mL (ref 0–34)

## 2021-04-03 LAB — CEA: CEA: 10.2 ng/mL — ABNORMAL HIGH (ref 0.0–4.7)

## 2021-04-14 ENCOUNTER — Encounter (HOSPITAL_COMMUNITY): Payer: Medicare Other

## 2021-04-15 ENCOUNTER — Other Ambulatory Visit (HOSPITAL_COMMUNITY): Payer: Self-pay | Admitting: *Deleted

## 2021-04-15 DIAGNOSIS — C50911 Malignant neoplasm of unspecified site of right female breast: Secondary | ICD-10-CM

## 2021-04-15 DIAGNOSIS — C182 Malignant neoplasm of ascending colon: Secondary | ICD-10-CM

## 2021-04-18 ENCOUNTER — Ambulatory Visit (HOSPITAL_COMMUNITY)
Admission: RE | Admit: 2021-04-18 | Discharge: 2021-04-18 | Disposition: A | Discharge status: Home / Self Care | Payer: Medicare Other | Source: Ambulatory Visit | Attending: Hematology | Admitting: Hematology

## 2021-04-18 ENCOUNTER — Other Ambulatory Visit: Payer: Self-pay

## 2021-04-18 DIAGNOSIS — I714 Abdominal aortic aneurysm, without rupture: Secondary | ICD-10-CM | POA: Diagnosis not present

## 2021-04-18 DIAGNOSIS — N200 Calculus of kidney: Secondary | ICD-10-CM | POA: Diagnosis not present

## 2021-04-18 DIAGNOSIS — C182 Malignant neoplasm of ascending colon: Secondary | ICD-10-CM | POA: Diagnosis not present

## 2021-04-18 DIAGNOSIS — K5750 Diverticulosis of both small and large intestine without perforation or abscess without bleeding: Secondary | ICD-10-CM | POA: Diagnosis not present

## 2021-04-18 DIAGNOSIS — C189 Malignant neoplasm of colon, unspecified: Secondary | ICD-10-CM | POA: Diagnosis not present

## 2021-04-18 DIAGNOSIS — I700 Atherosclerosis of aorta: Secondary | ICD-10-CM | POA: Diagnosis not present

## 2021-04-18 DIAGNOSIS — C50911 Malignant neoplasm of unspecified site of right female breast: Secondary | ICD-10-CM

## 2021-04-18 DIAGNOSIS — I2510 Atherosclerotic heart disease of native coronary artery without angina pectoris: Secondary | ICD-10-CM | POA: Diagnosis not present

## 2021-04-18 MED ORDER — IOHEXOL 9 MG/ML PO SOLN
ORAL | Status: AC
  Filled 2021-04-18: qty 1000

## 2021-04-24 ENCOUNTER — Inpatient Hospital Stay (HOSPITAL_COMMUNITY): Payer: Medicare Other

## 2021-04-24 ENCOUNTER — Ambulatory Visit (HOSPITAL_COMMUNITY): Payer: Medicare Other

## 2021-04-24 ENCOUNTER — Ambulatory Visit (HOSPITAL_COMMUNITY): Payer: Medicare Other | Admitting: Hematology

## 2021-04-24 DIAGNOSIS — C50911 Malignant neoplasm of unspecified site of right female breast: Secondary | ICD-10-CM

## 2021-04-24 DIAGNOSIS — C182 Malignant neoplasm of ascending colon: Secondary | ICD-10-CM

## 2021-04-28 ENCOUNTER — Other Ambulatory Visit: Payer: Self-pay

## 2021-04-28 ENCOUNTER — Inpatient Hospital Stay (HOSPITAL_COMMUNITY): Payer: Medicare Other | Attending: Hematology

## 2021-04-28 ENCOUNTER — Encounter (HOSPITAL_COMMUNITY): Payer: Self-pay

## 2021-04-28 ENCOUNTER — Inpatient Hospital Stay (HOSPITAL_COMMUNITY): Payer: Medicare Other

## 2021-04-28 VITALS — BP 192/77 | HR 55 | Temp 96.9°F | Resp 18

## 2021-04-28 DIAGNOSIS — Z7982 Long term (current) use of aspirin: Secondary | ICD-10-CM | POA: Insufficient documentation

## 2021-04-28 DIAGNOSIS — Z87891 Personal history of nicotine dependence: Secondary | ICD-10-CM | POA: Insufficient documentation

## 2021-04-28 DIAGNOSIS — Z170 Estrogen receptor positive status [ER+]: Secondary | ICD-10-CM | POA: Diagnosis not present

## 2021-04-28 DIAGNOSIS — E039 Hypothyroidism, unspecified: Secondary | ICD-10-CM | POA: Diagnosis not present

## 2021-04-28 DIAGNOSIS — R030 Elevated blood-pressure reading, without diagnosis of hypertension: Secondary | ICD-10-CM

## 2021-04-28 DIAGNOSIS — C773 Secondary and unspecified malignant neoplasm of axilla and upper limb lymph nodes: Secondary | ICD-10-CM | POA: Insufficient documentation

## 2021-04-28 DIAGNOSIS — C182 Malignant neoplasm of ascending colon: Secondary | ICD-10-CM | POA: Diagnosis not present

## 2021-04-28 DIAGNOSIS — Z5112 Encounter for antineoplastic immunotherapy: Secondary | ICD-10-CM | POA: Diagnosis not present

## 2021-04-28 DIAGNOSIS — Z79811 Long term (current) use of aromatase inhibitors: Secondary | ICD-10-CM | POA: Diagnosis not present

## 2021-04-28 DIAGNOSIS — C50011 Malignant neoplasm of nipple and areola, right female breast: Secondary | ICD-10-CM | POA: Insufficient documentation

## 2021-04-28 DIAGNOSIS — Z79899 Other long term (current) drug therapy: Secondary | ICD-10-CM | POA: Insufficient documentation

## 2021-04-28 DIAGNOSIS — C50911 Malignant neoplasm of unspecified site of right female breast: Secondary | ICD-10-CM

## 2021-04-28 LAB — COMPREHENSIVE METABOLIC PANEL
ALT: 16 U/L (ref 0–44)
AST: 20 U/L (ref 15–41)
Albumin: 3.7 g/dL (ref 3.5–5.0)
Alkaline Phosphatase: 79 U/L (ref 38–126)
Anion gap: 7 (ref 5–15)
BUN: 21 mg/dL (ref 8–23)
CO2: 26 mmol/L (ref 22–32)
Calcium: 9.4 mg/dL (ref 8.9–10.3)
Chloride: 108 mmol/L (ref 98–111)
Creatinine, Ser: 1.41 mg/dL — ABNORMAL HIGH (ref 0.44–1.00)
GFR, Estimated: 37 mL/min — ABNORMAL LOW (ref >60)
Glucose, Bld: 126 mg/dL — ABNORMAL HIGH (ref 70–99)
Potassium: 4 mmol/L (ref 3.5–5.1)
Sodium: 141 mmol/L (ref 135–145)
Total Bilirubin: 0.7 mg/dL (ref 0.3–1.2)
Total Protein: 6.3 g/dL — ABNORMAL LOW (ref 6.5–8.1)

## 2021-04-28 LAB — CBC WITH DIFFERENTIAL/PLATELET
Abs Immature Granulocytes: 0.02 10*3/uL (ref 0.00–0.07)
Basophils Absolute: 0.1 10*3/uL (ref 0.0–0.1)
Basophils Relative: 2 %
Eosinophils Absolute: 0.2 10*3/uL (ref 0.0–0.5)
Eosinophils Relative: 3 %
HCT: 42.3 % (ref 36.0–46.0)
Hemoglobin: 13 g/dL (ref 12.0–15.0)
Immature Granulocytes: 0 %
Lymphocytes Relative: 25 %
Lymphs Abs: 1.1 10*3/uL (ref 0.7–4.0)
MCH: 28.9 pg (ref 26.0–34.0)
MCHC: 30.7 g/dL (ref 30.0–36.0)
MCV: 94 fL (ref 80.0–100.0)
Monocytes Absolute: 0.3 10*3/uL (ref 0.1–1.0)
Monocytes Relative: 7 %
Neutro Abs: 2.9 10*3/uL (ref 1.7–7.7)
Neutrophils Relative %: 63 %
Platelets: 152 10*3/uL (ref 150–400)
RBC: 4.5 MIL/uL (ref 3.87–5.11)
RDW: 14.5 % (ref 11.5–15.5)
WBC: 4.6 10*3/uL (ref 4.0–10.5)
nRBC: 0 % (ref 0.0–0.2)

## 2021-04-28 LAB — TSH: TSH: 0.196 u[IU]/mL — ABNORMAL LOW (ref 0.350–4.500)

## 2021-04-28 MED ORDER — METOPROLOL TARTRATE 25 MG PO TABS
25.0000 mg | ORAL_TABLET | Freq: Once | ORAL | Status: AC
  Administered 2021-04-28: 25 mg via ORAL
  Filled 2021-04-28: qty 1

## 2021-04-28 MED ORDER — HEPARIN SOD (PORK) LOCK FLUSH 100 UNIT/ML IV SOLN
500.0000 [IU] | Freq: Once | INTRAVENOUS | Status: AC | PRN
  Administered 2021-04-28: 500 [IU]

## 2021-04-28 MED ORDER — SODIUM CHLORIDE 0.9 % IV SOLN
Freq: Once | INTRAVENOUS | Status: AC
Start: 2021-04-28 — End: 2021-04-28

## 2021-04-28 MED ORDER — SODIUM CHLORIDE 0.9 % IV SOLN
200.0000 mg | Freq: Once | INTRAVENOUS | Status: AC
  Administered 2021-04-28: 200 mg via INTRAVENOUS
  Filled 2021-04-28: qty 8

## 2021-04-28 MED ORDER — SODIUM CHLORIDE 0.9% FLUSH
10.0000 mL | INTRAVENOUS | Status: DC | PRN
Start: 2021-04-28 — End: 2021-04-28
  Administered 2021-04-28: 10 mL

## 2021-04-28 NOTE — Progress Notes (Signed)
Patient presents today for Keytruda infusion. Blood pressure 189/75, no complaints voiced. Orders obtained from Dr. Lindi Adie to give Metoprolol 25 mg by mouth once, advise patient to call PCP and okay to treat today with Keytruda, okay to send home asymptomatic. Patient given Metoprolol as ordered. Patient tolerated chemotherapy with no complaints voiced. Side effects with management reviewed understanding verbalized. Port site clean and dry with no bruising or swelling noted at site. Good blood return noted before and after administration of chemotherapy. Band aid applied. Blood pressure after treatment 192/77 asymptomatic, Dr. Lindi Adie made aware of blood pressure, no orders received. Patient given a list of blood pressures recorded during today's treatment for PCP. Patient voices she will call PCP when she leaves. Patient left in satisfactory condition with VSS and no s/s of distress noted.

## 2021-04-28 NOTE — Patient Instructions (Signed)
[place]  Discharge Instructions: Thank you for choosing [facility] to provide your oncology and hematology care.  If you have a lab appointment with the [facility], please come in thru the Main Entrance and check in at the main information desk.  Wear comfortable clothing and clothing appropriate for easy access to any Portacath or PICC line.   We strive to give you quality time with your provider. You may need to reschedule your appointment if you arrive late (15 or more minutes).  Arriving late affects you and other patients whose appointments are after yours.  Also, if you miss three or more appointments without notifying the office, you may be dismissed from the clinic at the provider's discretion.      For prescription refill requests, have your pharmacy contact our office and allow 72 hours for refills to be completed.    Today you received the following chemotherapy and/or immunotherapy agents Keytuda. A list of blood pressures recorded from today's encounter was given to you to call your PCP. Call primary care provider as advised.     To help prevent nausea and vomiting after your treatment, we encourage you to take your nausea medication as directed.  BELOW ARE SYMPTOMS THAT SHOULD BE REPORTED IMMEDIATELY: . *FEVER GREATER THAN 100.4 F (38 C) OR HIGHER . *CHILLS OR SWEATING . *NAUSEA AND VOMITING THAT IS NOT CONTROLLED WITH YOUR NAUSEA MEDICATION . *UNUSUAL SHORTNESS OF BREATH . *UNUSUAL BRUISING OR BLEEDING . *URINARY PROBLEMS (pain or burning when urinating, or frequent urination) . *BOWEL PROBLEMS (unusual diarrhea, constipation, pain near the anus) . TENDERNESS IN MOUTH AND THROAT WITH OR WITHOUT PRESENCE OF ULCERS (sore throat, sores in mouth, or a toothache) . UNUSUAL RASH, SWELLING OR PAIN  . UNUSUAL VAGINAL DISCHARGE OR ITCHING   Items with * indicate a potential emergency and should be followed up as soon as possible or go to the  Emergency Department if any problems should occur.  Please show the CHEMOTHERAPY ALERT CARD or IMMUNOTHERAPY ALERT CARD at check-in to the Emergency Department and triage nurse.  Should you have questions after your visit or need to cancel or reschedule your appointment, please contact [facility] [phone]  and follow the prompts.  Office hours are 8:00 a.m. to 4:30 p.m. Monday - Friday. Please note that voicemails left after 4:00 p.m. may not be returned until the following business day.  We are closed weekends and major holidays. You have access to a nurse at all times for urgent questions. Please call the main number to the clinic [phone] and follow the prompts.  For any non-urgent questions, you may also contact your provider using MyChart. We now offer e-Visits for anyone 62 and older to request care online for non-urgent symptoms. For details visit [email or web address].   Also download the MyChart app! Go to the app store, search "MyChart", open the app, select [facility], and log in with your MyChart username and password.  Due to Covid, a mask is required upon entering the hospital/clinic. If you do not have a mask, one will be given to you upon arrival. For doctor visits, patients may have 1 support person aged 36 or older with them. For treatment visits, patients cannot have anyone with them due to current Covid guidelines and our immunocompromised population.

## 2021-04-28 NOTE — Progress Notes (Signed)
Reviewed patients blood pressures with Dr. Lindi Adie with verbal order Metoprolol 25 mg by mouth once and ok to treat today with Keytruda.

## 2021-04-29 LAB — CEA: CEA: 10.6 ng/mL — ABNORMAL HIGH (ref 0.0–4.7)

## 2021-05-01 ENCOUNTER — Other Ambulatory Visit: Payer: Self-pay | Admitting: Cardiology

## 2021-05-13 DIAGNOSIS — E782 Mixed hyperlipidemia: Secondary | ICD-10-CM | POA: Diagnosis not present

## 2021-05-13 DIAGNOSIS — E039 Hypothyroidism, unspecified: Secondary | ICD-10-CM | POA: Diagnosis not present

## 2021-05-13 DIAGNOSIS — I10 Essential (primary) hypertension: Secondary | ICD-10-CM | POA: Diagnosis not present

## 2021-05-13 DIAGNOSIS — I25111 Atherosclerotic heart disease of native coronary artery with angina pectoris with documented spasm: Secondary | ICD-10-CM | POA: Diagnosis not present

## 2021-05-13 DIAGNOSIS — Z853 Personal history of malignant neoplasm of breast: Secondary | ICD-10-CM | POA: Diagnosis not present

## 2021-05-13 DIAGNOSIS — I2510 Atherosclerotic heart disease of native coronary artery without angina pectoris: Secondary | ICD-10-CM | POA: Diagnosis not present

## 2021-05-13 DIAGNOSIS — Z862 Personal history of diseases of the blood and blood-forming organs and certain disorders involving the immune mechanism: Secondary | ICD-10-CM | POA: Diagnosis not present

## 2021-05-13 DIAGNOSIS — M170 Bilateral primary osteoarthritis of knee: Secondary | ICD-10-CM | POA: Diagnosis not present

## 2021-05-13 DIAGNOSIS — N1832 Chronic kidney disease, stage 3b: Secondary | ICD-10-CM | POA: Diagnosis not present

## 2021-05-13 DIAGNOSIS — I7409 Other arterial embolism and thrombosis of abdominal aorta: Secondary | ICD-10-CM | POA: Diagnosis not present

## 2021-05-13 DIAGNOSIS — I252 Old myocardial infarction: Secondary | ICD-10-CM | POA: Diagnosis not present

## 2021-05-13 DIAGNOSIS — I739 Peripheral vascular disease, unspecified: Secondary | ICD-10-CM | POA: Diagnosis not present

## 2021-05-15 ENCOUNTER — Other Ambulatory Visit (HOSPITAL_COMMUNITY): Payer: Medicare Other

## 2021-05-15 ENCOUNTER — Ambulatory Visit (HOSPITAL_COMMUNITY): Payer: Medicare Other

## 2021-05-15 ENCOUNTER — Ambulatory Visit (HOSPITAL_COMMUNITY): Payer: Medicare Other | Admitting: Hematology

## 2021-05-19 ENCOUNTER — Inpatient Hospital Stay (HOSPITAL_BASED_OUTPATIENT_CLINIC_OR_DEPARTMENT_OTHER): Payer: Medicare Other | Admitting: Oncology

## 2021-05-19 ENCOUNTER — Inpatient Hospital Stay (HOSPITAL_COMMUNITY): Payer: Medicare Other

## 2021-05-19 ENCOUNTER — Other Ambulatory Visit: Payer: Self-pay

## 2021-05-19 VITALS — BP 184/59 | HR 52 | Temp 96.8°F | Resp 18

## 2021-05-19 DIAGNOSIS — Z170 Estrogen receptor positive status [ER+]: Secondary | ICD-10-CM | POA: Diagnosis not present

## 2021-05-19 DIAGNOSIS — C773 Secondary and unspecified malignant neoplasm of axilla and upper limb lymph nodes: Secondary | ICD-10-CM

## 2021-05-19 DIAGNOSIS — C182 Malignant neoplasm of ascending colon: Secondary | ICD-10-CM

## 2021-05-19 DIAGNOSIS — E039 Hypothyroidism, unspecified: Secondary | ICD-10-CM | POA: Diagnosis not present

## 2021-05-19 DIAGNOSIS — Z79811 Long term (current) use of aromatase inhibitors: Secondary | ICD-10-CM | POA: Diagnosis not present

## 2021-05-19 DIAGNOSIS — Z7982 Long term (current) use of aspirin: Secondary | ICD-10-CM | POA: Diagnosis not present

## 2021-05-19 DIAGNOSIS — Z5112 Encounter for antineoplastic immunotherapy: Secondary | ICD-10-CM | POA: Diagnosis not present

## 2021-05-19 DIAGNOSIS — Z79899 Other long term (current) drug therapy: Secondary | ICD-10-CM | POA: Diagnosis not present

## 2021-05-19 DIAGNOSIS — C189 Malignant neoplasm of colon, unspecified: Secondary | ICD-10-CM

## 2021-05-19 DIAGNOSIS — Z87891 Personal history of nicotine dependence: Secondary | ICD-10-CM | POA: Diagnosis not present

## 2021-05-19 DIAGNOSIS — C50011 Malignant neoplasm of nipple and areola, right female breast: Secondary | ICD-10-CM | POA: Diagnosis not present

## 2021-05-19 DIAGNOSIS — C50911 Malignant neoplasm of unspecified site of right female breast: Secondary | ICD-10-CM

## 2021-05-19 LAB — COMPREHENSIVE METABOLIC PANEL
ALT: 15 U/L (ref 0–44)
AST: 19 U/L (ref 15–41)
Albumin: 3.7 g/dL (ref 3.5–5.0)
Alkaline Phosphatase: 86 U/L (ref 38–126)
Anion gap: 7 (ref 5–15)
BUN: 18 mg/dL (ref 8–23)
CO2: 25 mmol/L (ref 22–32)
Calcium: 9.3 mg/dL (ref 8.9–10.3)
Chloride: 106 mmol/L (ref 98–111)
Creatinine, Ser: 1.4 mg/dL — ABNORMAL HIGH (ref 0.44–1.00)
GFR, Estimated: 38 mL/min — ABNORMAL LOW (ref >60)
Glucose, Bld: 128 mg/dL — ABNORMAL HIGH (ref 70–99)
Potassium: 4 mmol/L (ref 3.5–5.1)
Sodium: 138 mmol/L (ref 135–145)
Total Bilirubin: 0.6 mg/dL (ref 0.3–1.2)
Total Protein: 6.3 g/dL — ABNORMAL LOW (ref 6.5–8.1)

## 2021-05-19 LAB — CBC WITH DIFFERENTIAL/PLATELET
Abs Immature Granulocytes: 0.01 10*3/uL (ref 0.00–0.07)
Basophils Absolute: 0.1 10*3/uL (ref 0.0–0.1)
Basophils Relative: 1 %
Eosinophils Absolute: 0.1 10*3/uL (ref 0.0–0.5)
Eosinophils Relative: 1 %
HCT: 41.8 % (ref 36.0–46.0)
Hemoglobin: 13.2 g/dL (ref 12.0–15.0)
Immature Granulocytes: 0 %
Lymphocytes Relative: 24 %
Lymphs Abs: 1.2 10*3/uL (ref 0.7–4.0)
MCH: 29.4 pg (ref 26.0–34.0)
MCHC: 31.6 g/dL (ref 30.0–36.0)
MCV: 93.1 fL (ref 80.0–100.0)
Monocytes Absolute: 0.4 10*3/uL (ref 0.1–1.0)
Monocytes Relative: 7 %
Neutro Abs: 3.3 10*3/uL (ref 1.7–7.7)
Neutrophils Relative %: 67 %
Platelets: 162 10*3/uL (ref 150–400)
RBC: 4.49 MIL/uL (ref 3.87–5.11)
RDW: 14.2 % (ref 11.5–15.5)
WBC: 5 10*3/uL (ref 4.0–10.5)
nRBC: 0 % (ref 0.0–0.2)

## 2021-05-19 MED ORDER — SODIUM CHLORIDE 0.9 % IV SOLN
200.0000 mg | Freq: Once | INTRAVENOUS | Status: AC
  Administered 2021-05-19: 200 mg via INTRAVENOUS
  Filled 2021-05-19: qty 8

## 2021-05-19 MED ORDER — SODIUM CHLORIDE 0.9 % IV SOLN: Freq: Once | INTRAVENOUS | Status: AC

## 2021-05-19 MED ORDER — HEPARIN SOD (PORK) LOCK FLUSH 100 UNIT/ML IV SOLN
500.0000 [IU] | Freq: Once | INTRAVENOUS | Status: AC | PRN
  Administered 2021-05-19: 500 [IU]

## 2021-05-19 MED ORDER — SODIUM CHLORIDE 0.9% FLUSH
10.0000 mL | INTRAVENOUS | Status: DC | PRN
  Administered 2021-05-19: 10 mL

## 2021-05-19 NOTE — Patient Instructions (Addendum)
[place] at [facility] Discharge Instructions  You were seen today by Dr. Grayland Ormond. He discussed your recent labs and CT scan look good no evidence of recurrent disease in the chest, abdomen or pelvis. You received your Keytruda today.  Please follow up as scheduled.   Thank you for choosing [place] at [facility] to provide your oncology and hematology care.  To afford each patient quality time with our provider, please arrive at least 15 minutes before your scheduled appointment time.   If you have a lab appointment with the [facility] please come in thru the Main Entrance and check in at the main information desk.  You need to re-schedule your appointment should you arrive 10 or more minutes late.  We strive to give you quality time with our providers, and arriving late affects you and other patients whose appointments are after yours.  Also, if you no show three or more times for appointments you may be dismissed from the clinic at the providers discretion.     Again, thank you for choosing [facility].  Our hope is that these requests will decrease the amount of time that you wait before being seen by our physicians.       _____________________________________________________________  Should you have questions after your visit to [facility], please contact our office at [phone] and follow the prompts.  Our office hours are 8:00 a.m. and 4:30 p.m. Monday - Friday.  Please note that voicemails left after 4:00 p.m. may not be returned until the following business day.  We are closed weekends and major holidays.  You do have access to a nurse 24-7, just call the main number to the clinic [phone] and do not press any options, hold on the line and a nurse will answer the phone.    For prescription refill requests, have your pharmacy contact our office and allow 72 hours.    Due to Covid, you will need to  wear a mask upon entering the hospital. If you do not have a mask, a mask will be given to you at the Main Entrance upon arrival. For doctor visits, patients may have 1 support person age 98 or older with them. For treatment visits, patients can not have anyone with them due to social distancing guidelines and our immunocompromised population.

## 2021-05-19 NOTE — Progress Notes (Signed)
Patient has been assessed, vital signs and labs have been reviewed by Dr. Finnegan. ANC, Creatinine, LFTs, and Platelets are within treatment parameters per Dr. Finnegan. The patient is good to proceed with treatment at this time.  Primary RN and pharmacy aware.  

## 2021-05-19 NOTE — Progress Notes (Signed)
Tolerated treatment well today without incidence.  Stable during and after treatment.  Discharged in stable condition ambulatory.  AVS reviewed. Vital signs stable prior to discharge.

## 2021-05-19 NOTE — Patient Instructions (Signed)
[facility]  Discharge Instructions: Thank you for choosing [facility] to provide your oncology and hematology care.  If you have a lab appointment with the Cancer Center, please come in thru the Main Entrance and check in at the main information desk.  Wear comfortable clothing and clothing appropriate for easy access to any Portacath or PICC line.   We strive to give you quality time with your provider. You may need to reschedule your appointment if you arrive late (15 or more minutes).  Arriving late affects you and other patients whose appointments are after yours.  Also, if you miss three or more appointments without notifying the office, you may be dismissed from the clinic at the provider's discretion.      For prescription refill requests, have your pharmacy contact our office and allow 72 hours for refills to be completed.    Today you received the following chemotherapy and/or immunotherapy agents keytruda      To help prevent nausea and vomiting after your treatment, we encourage you to take your nausea medication as directed.  BELOW ARE SYMPTOMS THAT SHOULD BE REPORTED IMMEDIATELY: *FEVER GREATER THAN 100.4 F (38 C) OR HIGHER *CHILLS OR SWEATING *NAUSEA AND VOMITING THAT IS NOT CONTROLLED WITH YOUR NAUSEA MEDICATION *UNUSUAL SHORTNESS OF BREATH *UNUSUAL BRUISING OR BLEEDING *URINARY PROBLEMS (pain or burning when urinating, or frequent urination) *BOWEL PROBLEMS (unusual diarrhea, constipation, pain near the anus) TENDERNESS IN MOUTH AND THROAT WITH OR WITHOUT PRESENCE OF ULCERS (sore throat, sores in mouth, or a toothache) UNUSUAL RASH, SWELLING OR PAIN  UNUSUAL VAGINAL DISCHARGE OR ITCHING   Items with * indicate a potential emergency and should be followed up as soon as possible or go to the Emergency Department if any problems should occur.  Please show the CHEMOTHERAPY ALERT CARD or IMMUNOTHERAPY ALERT CARD at check-in to the Emergency  Department and triage nurse.  Should you have questions after your visit or need to cancel or reschedule your appointment, please contact  [facility] [phone]  and follow the prompts.  Office hours are 8:00 a.m. to 4:30 p.m. Monday - Friday. Please note that voicemails left after 4:00 p.m. may not be returned until the following business day.  We are closed weekends and major holidays. You have access to a nurse at all times for urgent questions. Please call the main number to the clinic [phone] and follow the prompts.  For any non-urgent questions, you may also contact your provider using MyChart. We now offer e-Visits for anyone 18 and older to request care online for non-urgent symptoms. For details visit [email or web address].   Also download the MyChart app! Go to the app store, search "MyChart", open the app, select [facility], and log in with your MyChart username and password.  Due to Covid, a mask is required upon entering the hospital/clinic. If you do not have a mask, one will be given to you upon arrival. For doctor visits, patients may have 1 support person aged 18 or older with them. For treatment visits, patients cannot have anyone with them due to current Covid guidelines and our immunocompromised population.     Pembrolizumab injection What is this medication? PEMBROLIZUMAB (pem broe liz ue mab) is a monoclonal antibody. It is used totreat certain types of cancer. This medicine may be used for other purposes; ask your health care provider orpharmacist if you have questions. COMMON BRAND NAME(S): Keytruda What should I tell my care team before I take   this medication? They need to know if you have any of these conditions: autoimmune diseases like Crohn's disease, ulcerative colitis, or lupus have had or planning to have an allogeneic stem cell transplant (uses someone else's stem cells) history of organ transplant history of chest radiation nervous system  problems like myasthenia gravis or Guillain-Barre syndrome an unusual or allergic reaction to pembrolizumab, other medicines, foods, dyes, or preservatives pregnant or trying to get pregnant breast-feeding How should I use this medication? This medicine is for infusion into a vein. It is given by a health careprofessional in a hospital or clinic setting. A special MedGuide will be given to you before each treatment. Be sure to readthis information carefully each time. Talk to your pediatrician regarding the use of this medicine in children. While this drug may be prescribed for children as young as 6 months for selectedconditions, precautions do apply. Overdosage: If you think you have taken too much of this medicine contact apoison control center or emergency room at once. NOTE: This medicine is only for you. Do not share this medicine with others. What if I miss a dose? It is important not to miss your dose. Call your doctor or health careprofessional if you are unable to keep an appointment. What may interact with this medication? Interactions have not been studied. This list may not describe all possible interactions. Give your health care provider a list of all the medicines, herbs, non-prescription drugs, or dietary supplements you use. Also tell them if you smoke, drink alcohol, or use illegaldrugs. Some items may interact with your medicine. What should I watch for while using this medication? Your condition will be monitored carefully while you are receiving thismedicine. You may need blood work done while you are taking this medicine. Do not become pregnant while taking this medicine or for 4 months after stopping it. Women should inform their doctor if they wish to become pregnant or think they might be pregnant. There is a potential for serious side effects to an unborn child. Talk to your health care professional or pharmacist for more information. Do not breast-feed an infant while  taking this medicine orfor 4 months after the last dose. What side effects may I notice from receiving this medication? Side effects that you should report to your doctor or health care professionalas soon as possible: allergic reactions like skin rash, itching or hives, swelling of the face, lips, or tongue bloody or black, tarry breathing problems changes in vision chest pain chills confusion constipation cough diarrhea dizziness or feeling faint or lightheaded fast or irregular heartbeat fever flushing joint pain low blood counts - this medicine may decrease the number of white blood cells, red blood cells and platelets. You may be at increased risk for infections and bleeding. muscle pain muscle weakness pain, tingling, numbness in the hands or feet persistent headache redness, blistering, peeling or loosening of the skin, including inside the mouth signs and symptoms of high blood sugar such as dizziness; dry mouth; dry skin; fruity breath; nausea; stomach pain; increased hunger or thirst; increased urination signs and symptoms of kidney injury like trouble passing urine or change in the amount of urine signs and symptoms of liver injury like dark urine, light-colored stools, loss of appetite, nausea, right upper belly pain, yellowing of the eyes or skin sweating swollen lymph nodes weight loss Side effects that usually do not require medical attention (report to yourdoctor or health care professional if they continue or are bothersome): decreased appetite hair loss   tiredness This list may not describe all possible side effects. Call your doctor for medical advice about side effects. You may report side effects to FDA at1-800-FDA-1088. Where should I keep my medication? This drug is given in a hospital or clinic and will not be stored at home. NOTE: This sheet is a summary. It may not cover all possible information. If you have questions about this medicine, talk to your  doctor, pharmacist, orhealth care provider.  2022 Elsevier/Gold Standard (2019-10-11 21:44:53)  

## 2021-05-20 ENCOUNTER — Encounter (HOSPITAL_COMMUNITY): Payer: Self-pay | Admitting: Hematology

## 2021-05-20 NOTE — Progress Notes (Signed)
Jessica Taylor  [phone]) [phone] Fax:(336) [phone]  ID: Jessica Taylor OB: 09/19/1941  MR#: [id number]  JJH#:[id number]  Patient Care Team: Antony Contras, MD as PCP - General (Family Medicine) Jerline Pain, MD as PCP - Cardiology (Cardiology) Michael Boston, MD as Consulting Physician (General Surgery) Jerline Pain, MD as Consulting Physician (Cardiology) Mansouraty, Telford Nab., MD as Consulting Physician (Gastroenterology) Donetta Potts, RN as Oncology Nurse Navigator Derek Jack, MD as Consulting Physician (Medical Oncology)  I connected with [name] on 05/20/21 at 10:45 AM EDT by video enabled telemedicine visit and verified that I am speaking with the correct person using two identifiers.   I discussed the limitations, risks, security and privacy concerns of performing an evaluation and management service by telemedicine and the availability of in-person appointments. I also discussed with the patient that there may be a patient responsible charge related to this service. The patient expressed understanding and agreed to proceed.   Other persons participating in the visit and their role in the encounter: Patient, MD.  Patient's location: Clinic. Provider's location: Home.  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  INTERVAL HISTORY: Patient agreed to video assisted telemedicine visit for further evaluation.  She returns to clinic today for further evaluation and continuation of Keytruda.  She currently feels well and is asymptomatic.  She is tolerating her treatments well without significant side effects.  She has no neurologic complaints.  She denies any recent fevers or illnesses.  She has a good appetite and denies weight loss.  She has no chest pain, shortness of breath, cough, or hemoptysis.  She denies any nausea, vomiting, constipation, or diarrhea.  She has no urinary complaints.  Patient offers  no specific complaints today.  REVIEW OF SYSTEMS:   Review of Systems  Constitutional: Negative.  Negative for fever, malaise/fatigue and weight loss.  Respiratory: Negative.  Negative for cough, hemoptysis and shortness of breath.   Cardiovascular: Negative.  Negative for chest pain and leg swelling.  Gastrointestinal: Negative.  Negative for abdominal pain.  Genitourinary: Negative.  Negative for dysuria.  Musculoskeletal: Negative.  Negative for back pain.  Skin: Negative.  Negative for itching and rash.  Neurological: Negative.  Negative for dizziness, focal weakness, weakness and headaches.  Psychiatric/Behavioral: Negative.  The patient is not nervous/anxious.    As per HPI. Otherwise, a complete review of systems is negative.  PAST MEDICAL HISTORY: Past Medical History:  Diagnosis Date   Abdominal aortic aneurysm (HCC)    Acid reflux    Anemia    Arthritis    knees , R shoulder - tx /w injection - 11/2014   Basal cell carcinoma (BCC) of dorsum of nose 2010   Resolved   Cancer ([place])    basal cell on nose   Colon cancer (HCC)    Coronary artery disease    Hypercholesteremia    Hypertension    Hypothyroidism    Lt Acute pyelonephritis 09/11/2018   MI, old 2000   Peripheral arterial disease ([place])    hhigh-grade ostial bilateral calcified iliac stenosis with claudication   Tobacco abuse    Vertigo    when lays on left side.    PAST SURGICAL HISTORY: Past Surgical History:  Procedure Laterality Date   BREAST LUMPECTOMY WITH RADIOACTIVE SEED AND SENTINEL LYMPH NODE BIOPSY Bilateral 04/02/2020   Procedure: BILATERAL BREAST LUMPECTOMY WITH RADIOACTIVE SEED AND RIGHT SENTINEL LYMPH NODE BIOPSY AND RIGHT TARGETED AXILLARY LYMPH NODE  BIOPSY;  Surgeon: Erroll Luna, MD;  Location: [facility];  Service: General;  Laterality: Bilateral;   CARDIAC CATHETERIZATION  5 stents   CARDIAC CATHETERIZATION N/A 01/20/2016   Procedure: Left Heart Cath and Coronary  Angiography;  Surgeon: Peter M Martinique, MD;  Location: [place];  Service: Cardiovascular;  Laterality: N/A;   CATARACT EXTRACTION W/PHACO Left 05/24/2014   Procedure: CATARACT EXTRACTION PHACO AND INTRAOCULAR LENS PLACEMENT (IOC);  Surgeon: Tonny Branch, MD;  Location: [facility];  Service: Ophthalmology;  Laterality: Left;  CDE:  9.30   CATARACT EXTRACTION W/PHACO Right 06/18/2014   Procedure: CATARACT EXTRACTION PHACO AND INTRAOCULAR LENS PLACEMENT RIGHT EYE CDE=10.84;  Surgeon: Tonny Branch, MD;  Location: [facility];  Service: Ophthalmology;  Laterality: Right;   CORONARY ARTERY BYPASS GRAFT N/A 01/24/2016   Procedure: CORONARY ARTERY BYPASS GRAFTING (CABG);  Surgeon: Gaye Pollack, MD;  Location: [place];  Service: Open Heart Surgery;  Laterality: N/A;   CORONARY STENT PLACEMENT     CORONARY STENT PLACEMENT  03/06/15   CFX DES   CYSTOSCOPY/URETEROSCOPY/HOLMIUM LASER/STENT PLACEMENT Left 09/12/2018   Procedure: CYSTOSCOPY/URETEROSCOPY/STENT PLACEMENT;  Surgeon: Ceasar Mons, MD;  Location: [facility];  Service: Urology;  Laterality: Left;   EYE SURGERY     LEFT HEART CATH AND CORS/GRAFTS ANGIOGRAPHY N/A 09/12/2019   Procedure: LEFT HEART CATH AND CORS/GRAFTS ANGIOGRAPHY;  Surgeon: Troy Sine, MD;  Location: [place] [facility];  Service: Cardiovascular;  Laterality: N/A;   LEFT HEART CATHETERIZATION WITH CORONARY ANGIOGRAM N/A 03/06/2015   Procedure: LEFT HEART CATHETERIZATION WITH CORONARY ANGIOGRAM;  Surgeon: Lorretta Harp, MD;  Location[facility];  Service: Cardiovascular;  Laterality: N/A;   PARTIAL KNEE ARTHROPLASTY Right 02/04/2015   Procedure: UNICOMPARTMENTAL KNEE;  Surgeon: Dorna Leitz, MD;  Location: [place];  Service: Orthopedics;  Laterality: Right;   PERIPHERAL VASCULAR CATHETERIZATION Bilateral 05/13/2015   Procedure: Lower Extremity Angiography;  Surgeon: Lorretta Harp, MD;  Location: [place] [facility];  Service: Cardiovascular;  Laterality: Bilateral;   PERIPHERAL  VASCULAR CATHETERIZATION N/A 05/13/2015   Procedure: Abdominal Aortogram;  Surgeon: Lorretta Harp, MD;  Location: [place] [facility];  Service: Cardiovascular;  Laterality: N/A;   PERIPHERAL VASCULAR CATHETERIZATION Bilateral 06/27/2015   Procedure: Peripheral Vascular Intervention;  Surgeon: Lorretta Harp, MD;  Location: [place] [facility];  Service: Cardiovascular;  Laterality: Bilateral;  ILIACS   PERIPHERAL VASCULAR CATHETERIZATION Bilateral 06/27/2015   Procedure: Peripheral Vascular Atherectomy;  Surgeon: Lorretta Harp, MD;  Location: [place] [facility];  Service: Cardiovascular;  Laterality: Bilateral;   PORTACATH PLACEMENT Left 05/24/2020   Procedure: INSERTION PORT-A-CATH;  Surgeon: Aviva Signs, MD;  Location: [facility];  Service: General;  Laterality: Left;   TEE WITHOUT CARDIOVERSION N/A 01/24/2016   Procedure: TRANSESOPHAGEAL ECHOCARDIOGRAM (TEE);  Surgeon: Gaye Pollack, MD;  Location: [place];  Service: Open Heart Surgery;  Laterality: N/A;   TONSILLECTOMY     age 80   TUBAL LIGATION      FAMILY HISTORY: Family History  Problem Relation Age of Onset   Ovarian cancer Mother 5   Cancer Father 72       unsure of which kind, "it was in his glands"   Hypertension Maternal Grandmother    Stroke Maternal Grandfather    Hypertension Son    Brain cancer Sister    Hypertension Daughter    Heart attack Neg Hx    Colon cancer Neg Hx    Esophageal cancer  Neg Hx    Inflammatory bowel disease Neg Hx    Liver disease Neg Hx    Pancreatic cancer Neg Hx    Rectal cancer Neg Hx    Stomach cancer Neg Hx     ADVANCED DIRECTIVES (Y/N):  N  HEALTH MAINTENANCE: Social History   Tobacco Use   Smoking status: Former    Packs/day: 1.50    Years: 42.00    Pack years: 63.00    Types: Cigarettes    Quit date: 05/19/1999    Years since quitting: 22.0   Smokeless tobacco: Never  Vaping Use   Vaping Use: Never used  Substance Use Topics   Alcohol use: No   Drug use: No      Colonoscopy:  PAP:  Bone density:  Lipid panel:  Allergies  Allergen Reactions   Crab [Shellfish Allergy] Nausea And Vomiting    Throws up violently   Ezetimibe Diarrhea   Other Nausea And Vomiting    Shrimp    Current Outpatient Medications  Medication Sig Dispense Refill   anastrozole (ARIMIDEX) 1 MG tablet TAKE 1 TABLET BY MOUTH ONCE DAILY. 30 tablet 6   aspirin EC 81 MG tablet Take 81 mg by mouth every evening.      atorvastatin (LIPITOR) 40 MG tablet Take 1 tablet (40 mg total) by mouth daily. Pt needs to make appt with provider for further refills. 1st attempt 90 tablet 1   clopidogrel (PLAVIX) 75 MG tablet TAKE (1) TABLET BY MOUTH ONCE DAILY. 90 tablet 3   ergocalciferol (VITAMIN D2) 1.25 MG (50000 UT) capsule Take 1 capsule (50,000 Units total) by mouth once a week. 4 capsule 4   esomeprazole (NEXIUM) 20 MG capsule Take 20 mg by mouth daily.      HYDROcodone-acetaminophen (NORCO/VICODIN) 5-325 MG tablet      hydrOXYzine (ATARAX/VISTARIL) 25 MG tablet Take 1 tablet (25 mg total) by mouth at bedtime as needed. 30 tablet 3   levothyroxine (SYNTHROID, LEVOTHROID) 50 MCG tablet Take 50 mcg by mouth daily before breakfast.     lidocaine-prilocaine (EMLA) cream Apply to affected area once 30 g 3   metoprolol tartrate (LOPRESSOR) 25 MG tablet Take 1 tablet (25 mg total) by mouth 2 (two) times daily. 180 tablet 3   nitroGLYCERIN (NITROSTAT) 0.4 MG SL tablet Place 1 tablet (0.4 mg total) under the tongue every 5 (five) minutes as needed for chest pain. 25 tablet prn   olmesartan (BENICAR) 5 MG tablet 1 tablet     Pembrolizumab (KEYTRUDA IV) Inject 200 mg into the vein every 21 ( twenty-one) days.      pembrolizumab (KEYTRUDA) 100 MG/4ML SOLN See admin instructions.     prochlorperazine (COMPAZINE) 10 MG tablet Take 1 tablet (10 mg total) by mouth every 6 (six) hours as needed (Nausea or vomiting). 30 tablet 1   No current facility-administered medications for this visit.     OBJECTIVE: There were no vitals filed for this visit.   There is no height or weight on file to calculate BMI.    ECOG FS:0 - Asymptomatic  General: Well-developed, well-nourished, no acute distress. HEENT: Normocephalic. Neuro: Alert, answering all questions appropriately. Cranial nerves grossly intact. Psych: Normal affect.   LAB RESULTS:  Lab Results  Component Value Date   NA 138 05/19/2021   K 4.0 05/19/2021   CL 106 05/19/2021   CO2 25 05/19/2021   GLUCOSE 128 (H) 05/19/2021   BUN 18 05/19/2021   CREATININE 1.40 (H) 05/19/2021  CALCIUM 9.3 05/19/2021   PROT 6.3 (L) 05/19/2021   ALBUMIN 3.7 05/19/2021   AST 19 05/19/2021   ALT 15 05/19/2021   ALKPHOS 86 05/19/2021   BILITOT 0.6 05/19/2021   GFRNONAA 38 (L) 05/19/2021   GFRAA 30 (L) 08/06/2020    Lab Results  Component Value Date   WBC 5.0 05/19/2021   NEUTROABS 3.3 05/19/2021   HGB 13.2 05/19/2021   HCT 41.8 05/19/2021   MCV 93.1 05/19/2021   PLT 162 05/19/2021     STUDIES: No results found.  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  Patient's most recent imaging with CT scan on Apr 18, 2021 reviewed independently with no obvious evidence of recurrent or progressive metastatic disease.  Return to clinic in 3 weeks for treatment only and then in 6 weeks for further evaluation and continuation of treatment. Breast cancer: Continue anastrozole as prescribed. Osteoporosis: Continue calcium and vitamin D supplementation. History of bladder cancer: Patient has continued follow-up with urology at [facility]. Generalized pruritus: Patient does not complain of this today.  Continue Atarax and topical creams as directed. Renal insufficiency: Chronic and unchanged.  Patient's creatinine is 1.40 today.  I provided 30 minutes of face-to-face video visit time  during this encounter which included chart review, counseling, and coordination of care as documented above.    Patient expressed understanding and was in agreement with this plan. She also understands that She can call clinic at any time with any questions, concerns, or complaints.   Cancer Staging Cancer of ascending colon s/p robotic proximal colectomy 10/11/2019 Staging form: Colon and Rectum, AJCC 8th Edition - Clinical stage from 10/23/2019: Stage IVA (cT3, cN1c, cM1a) - Signed by Derek Jack, MD on 12/14/2019 Total positive nodes: 0 Histologic grade (G): G3 Histologic grading system: 4 grade system  Malignant neoplasm of right female breast [facility]) Staging form: Breast, AJCC 8th Edition - Clinical stage from 04/23/2020: Stage IA (cT1c, cN0(sn), cM0, G2, ER+, PR+, HER2-) - Signed by Derek Jack, MD on 04/23/2020 Stage prefix: Initial diagnosis Method of lymph node assessment: Sentinel lymph node biopsy Histologic grading system: 3 grade system   Lloyd Huger, MD   05/20/2021 2:18 PM

## 2021-05-31 ENCOUNTER — Encounter (HOSPITAL_COMMUNITY): Payer: Self-pay

## 2021-06-09 ENCOUNTER — Encounter (HOSPITAL_COMMUNITY): Payer: Self-pay

## 2021-06-09 ENCOUNTER — Inpatient Hospital Stay (HOSPITAL_COMMUNITY): Payer: Medicare Other | Attending: Hematology

## 2021-06-09 ENCOUNTER — Other Ambulatory Visit: Payer: Self-pay

## 2021-06-09 ENCOUNTER — Inpatient Hospital Stay (HOSPITAL_COMMUNITY): Payer: Medicare Other

## 2021-06-09 VITALS — BP 161/52 | HR 49 | Temp 96.6°F | Resp 17

## 2021-06-09 DIAGNOSIS — Z7982 Long term (current) use of aspirin: Secondary | ICD-10-CM | POA: Diagnosis not present

## 2021-06-09 DIAGNOSIS — Z79899 Other long term (current) drug therapy: Secondary | ICD-10-CM | POA: Insufficient documentation

## 2021-06-09 DIAGNOSIS — C182 Malignant neoplasm of ascending colon: Secondary | ICD-10-CM | POA: Diagnosis not present

## 2021-06-09 DIAGNOSIS — Z5112 Encounter for antineoplastic immunotherapy: Secondary | ICD-10-CM | POA: Insufficient documentation

## 2021-06-09 DIAGNOSIS — C50911 Malignant neoplasm of unspecified site of right female breast: Secondary | ICD-10-CM | POA: Diagnosis not present

## 2021-06-09 DIAGNOSIS — E039 Hypothyroidism, unspecified: Secondary | ICD-10-CM | POA: Diagnosis not present

## 2021-06-09 DIAGNOSIS — I10 Essential (primary) hypertension: Secondary | ICD-10-CM | POA: Insufficient documentation

## 2021-06-09 DIAGNOSIS — C773 Secondary and unspecified malignant neoplasm of axilla and upper limb lymph nodes: Secondary | ICD-10-CM | POA: Insufficient documentation

## 2021-06-09 DIAGNOSIS — R7989 Other specified abnormal findings of blood chemistry: Secondary | ICD-10-CM

## 2021-06-09 DIAGNOSIS — Z79811 Long term (current) use of aromatase inhibitors: Secondary | ICD-10-CM | POA: Insufficient documentation

## 2021-06-09 DIAGNOSIS — Z170 Estrogen receptor positive status [ER+]: Secondary | ICD-10-CM | POA: Insufficient documentation

## 2021-06-09 DIAGNOSIS — Z87891 Personal history of nicotine dependence: Secondary | ICD-10-CM | POA: Insufficient documentation

## 2021-06-09 LAB — CBC WITH DIFFERENTIAL/PLATELET
Abs Immature Granulocytes: 0.02 10*3/uL (ref 0.00–0.07)
Basophils Absolute: 0.1 10*3/uL (ref 0.0–0.1)
Basophils Relative: 1 %
Eosinophils Absolute: 0.1 10*3/uL (ref 0.0–0.5)
Eosinophils Relative: 2 %
HCT: 41.4 % (ref 36.0–46.0)
Hemoglobin: 13 g/dL (ref 12.0–15.0)
Immature Granulocytes: 0 %
Lymphocytes Relative: 21 %
Lymphs Abs: 1.2 10*3/uL (ref 0.7–4.0)
MCH: 29.1 pg (ref 26.0–34.0)
MCHC: 31.4 g/dL (ref 30.0–36.0)
MCV: 92.6 fL (ref 80.0–100.0)
Monocytes Absolute: 0.5 10*3/uL (ref 0.1–1.0)
Monocytes Relative: 9 %
Neutro Abs: 3.6 10*3/uL (ref 1.7–7.7)
Neutrophils Relative %: 67 %
Platelets: 164 10*3/uL (ref 150–400)
RBC: 4.47 MIL/uL (ref 3.87–5.11)
RDW: 14 % (ref 11.5–15.5)
WBC: 5.5 10*3/uL (ref 4.0–10.5)
nRBC: 0 % (ref 0.0–0.2)

## 2021-06-09 LAB — COMPREHENSIVE METABOLIC PANEL
ALT: 14 U/L (ref 0–44)
AST: 16 U/L (ref 15–41)
Albumin: 3.6 g/dL (ref 3.5–5.0)
Alkaline Phosphatase: 79 U/L (ref 38–126)
Anion gap: 8 (ref 5–15)
BUN: 27 mg/dL — ABNORMAL HIGH (ref 8–23)
CO2: 25 mmol/L (ref 22–32)
Calcium: 9.1 mg/dL (ref 8.9–10.3)
Chloride: 107 mmol/L (ref 98–111)
Creatinine, Ser: 1.73 mg/dL — ABNORMAL HIGH (ref 0.44–1.00)
GFR, Estimated: 29 mL/min — ABNORMAL LOW (ref >60)
Glucose, Bld: 116 mg/dL — ABNORMAL HIGH (ref 70–99)
Potassium: 4.1 mmol/L (ref 3.5–5.1)
Sodium: 140 mmol/L (ref 135–145)
Total Bilirubin: 0.5 mg/dL (ref 0.3–1.2)
Total Protein: 6.2 g/dL — ABNORMAL LOW (ref 6.5–8.1)

## 2021-06-09 LAB — TSH: TSH: 0.346 u[IU]/mL — ABNORMAL LOW (ref 0.350–4.500)

## 2021-06-09 MED ORDER — SODIUM CHLORIDE 0.9 % IV SOLN: Freq: Once | INTRAVENOUS | Status: AC

## 2021-06-09 MED ORDER — SODIUM CHLORIDE 0.9% FLUSH
10.0000 mL | INTRAVENOUS | Status: DC | PRN
  Administered 2021-06-09 (×2): 10 mL

## 2021-06-09 MED ORDER — HEPARIN SOD (PORK) LOCK FLUSH 100 UNIT/ML IV SOLN
500.0000 [IU] | Freq: Once | INTRAVENOUS | Status: AC | PRN
  Administered 2021-06-09: 500 [IU]

## 2021-06-09 MED ORDER — SODIUM CHLORIDE 0.9 % IV SOLN
200.0000 mg | Freq: Once | INTRAVENOUS | Status: AC
  Administered 2021-06-09: 200 mg via INTRAVENOUS
  Filled 2021-06-09: qty 8

## 2021-06-09 NOTE — Progress Notes (Signed)
Patient presents today for Keytruda. Ser. Creatinine 1.73, patient reports having chest pain last night which resolved with Nitroglycerin. Patient also reports having occasional numbness & tingling in fingers since last visit. Dr. Delton Coombes made aware of Ser. Creatinine and patient's reported symptoms, Verbal order received; okay to proceed with treatment and to give a NS 500 ml bolus as well.    Patient tolerated NS 522mL bolus with no complaints voiced. Patient tolerated Keytruda with no complaints voiced. Side effects with management reviewed understanding verbalized. Port site clean and dry with no bruising or swelling noted at site. Good blood return noted before and after administration of chemotherapy. Band aid applied. Patient left in satisfactory condition with VSS and no s/s of distress noted.

## 2021-06-09 NOTE — Progress Notes (Signed)
Ok to proceed with elevated scr today , patient will receive NS 500 ml bolus as well as Keytruda.  T.O. Dr Rhys Martini, PharmD

## 2021-06-09 NOTE — Patient Instructions (Signed)
[facility]  Discharge Instructions: Thank you for choosing [facility] to provide your oncology and hematology care.  If you have a lab appointment with the [facility], please come in thru the Main Entrance and check in at the main information desk.  Wear comfortable clothing and clothing appropriate for easy access to any Portacath or PICC line.   We strive to give you quality time with your provider. You may need to reschedule your appointment if you arrive late (15 or more minutes).  Arriving late affects you and other patients whose appointments are after yours.  Also, if you miss three or more appointments without notifying the office, you may be dismissed from the clinic at the provider's discretion.      For prescription refill requests, have your pharmacy contact our office and allow 72 hours for refills to be completed.    Today you received the following chemotherapy and/or immunotherapy agents: Keytruda, & 536mL Bolus. Return as scheduled.    To help prevent nausea and vomiting after your treatment, we encourage you to take your nausea medication as directed.  BELOW ARE SYMPTOMS THAT SHOULD BE REPORTED IMMEDIATELY: *FEVER GREATER THAN 100.4 F (38 C) OR HIGHER *CHILLS OR SWEATING *NAUSEA AND VOMITING THAT IS NOT CONTROLLED WITH YOUR NAUSEA MEDICATION *UNUSUAL SHORTNESS OF BREATH *UNUSUAL BRUISING OR BLEEDING *URINARY PROBLEMS (pain or burning when urinating, or frequent urination) *BOWEL PROBLEMS (unusual diarrhea, constipation, pain near the anus) TENDERNESS IN MOUTH AND THROAT WITH OR WITHOUT PRESENCE OF ULCERS (sore throat, sores in mouth, or a toothache) UNUSUAL RASH, SWELLING OR PAIN  UNUSUAL VAGINAL DISCHARGE OR ITCHING   Items with * indicate a potential emergency and should be followed up as soon as possible or go to the Emergency Department if any problems should occur.  Please show the CHEMOTHERAPY ALERT CARD or IMMUNOTHERAPY ALERT CARD  at check-in to the Emergency Department and triage nurse.  Should you have questions after your visit or need to cancel or reschedule your appointment, please contact [facility] [phone]  and follow the prompts.  Office hours are 8:00 a.m. to 4:30 p.m. Monday - Friday. Please note that voicemails left after 4:00 p.m. may not be returned until the following business day.  We are closed weekends and major holidays. You have access to a nurse at all times for urgent questions. Please call the main number to the clinic [phone] and follow the prompts.  For any non-urgent questions, you may also contact your provider using MyChart. We now offer e-Visits for anyone 63 and older to request care online for non-urgent symptoms. For details visit [email or web address].   Also download the MyChart app! Go to the app store, search "MyChart", open the app, select [facility], and log in with your MyChart username and password.  Due to Covid, a mask is required upon entering the hospital/clinic. If you do not have a mask, one will be given to you upon arrival. For doctor visits, patients may have 1 support person aged 94 or older with them. For treatment visits, patients cannot have anyone with them due to current Covid guidelines and our immunocompromised population.

## 2021-06-10 LAB — CEA: CEA: 7 ng/mL — ABNORMAL HIGH (ref 0.0–4.7)

## 2021-06-28 NOTE — Progress Notes (Signed)
Jessica Taylor, Jessica Taylor   CLINIC:  Medical Oncology/Hematology  PCP:  Antony Contras, MD [id number] / [place] [address] [phone]   REASON FOR VISIT:  Follow-up for stage I right breast cancer and colon cancer  PRIOR THERAPY:  1. Robotic proximal colectomy on 10/11/2019. 2. Right breast lumpectomy and SLNB on 04/02/2020.  NGS Results: MSI--high, Foundation 1 not sent  CURRENT THERAPY: Keytruda every 3 weeks  BRIEF ONCOLOGIC HISTORY:  Oncology History  Cancer of ascending colon s/p robotic proximal colectomy 10/11/2019  10/11/2019 Initial Diagnosis   Cancer of ascending colon s/p robotic proximal colectomy 10/11/2019    10/23/2019 Cancer Staging   Staging form: Colon and Rectum, AJCC 8th Edition - Clinical stage from 10/23/2019: Stage IVA (cT3, cN1c, cM1a) - Signed by Derek Jack, MD on 12/14/2019    05/14/2020 -  Chemotherapy    Patient is on Treatment Plan: COLORECTAL PEMBROLIZUMAB Q21D       Malignant neoplasm of right female breast ([place])  04/23/2020 Initial Diagnosis   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 [facility])    04/23/2020 Cancer Staging   Staging form: Breast, AJCC 8th Edition - Clinical stage from 04/23/2020: Stage IA (cT1c, cN0(sn), cM0, G2, ER+, PR+, HER2-) - Signed by Derek Jack, MD on 04/23/2020      CANCER STAGING: Cancer Staging Cancer of ascending colon s/p robotic proximal colectomy 10/11/2019 Staging form: Colon and Rectum, AJCC 8th Edition - Clinical stage from 10/23/2019: Stage IVA (cT3, cN1c, cM1a) - Signed by Derek Jack, MD on 12/14/2019  Malignant neoplasm of right female breast [facility]) Staging form: Breast, AJCC 8th Edition - Clinical stage from 04/23/2020: Stage IA (cT1c, cN0(sn), cM0, G2, ER+, PR+, HER2-) - Signed by Derek Jack, MD on 04/23/2020   INTERVAL HISTORY:  Jessica Taylor, a 80 y.o. female, returns for  routine follow-up and consideration for next cycle of chemotherapy. Jessica Taylor was last seen on 04/02/21.  Due for cycle #20 of Keytruda today.   Overall, she tells me she has been feeling pretty well. She reports watery diarrhea 1-3 times a day once every 2 weeks, and she denies nausea and vomiting. Her appetite is reduced, and she reports pain in her hands. She denies SOB, itching, and rash. She reports drinking plenty of fluids.  Overall, she feels ready for next cycle of chemo today.   REVIEW OF SYSTEMS:  Review of Systems  Constitutional:  Positive for appetite change (60%). Negative for fatigue (75%).  Respiratory:  Positive for shortness of breath.   Cardiovascular:  Positive for chest pain (occasional).  Musculoskeletal:  Positive for arthralgias (hands).  Skin:  Positive for itching and rash.  All other systems reviewed and are negative.  PAST MEDICAL/SURGICAL HISTORY:  Past Medical History:  Diagnosis Date   Abdominal aortic aneurysm (HCC)    Acid reflux    Anemia    Arthritis    knees , R shoulder - tx /w injection - 11/2014   Basal cell carcinoma (BCC) of dorsum of nose 2010   Resolved   Cancer ([place])    basal cell on nose   Colon cancer ([place])    Coronary artery disease    Hypercholesteremia    Hypertension    Hypothyroidism    Lt Acute pyelonephritis 09/11/2018   MI, old 2000   Peripheral arterial disease ([place])    hhigh-grade ostial bilateral calcified iliac stenosis with claudication   Tobacco abuse  Vertigo    when lays on left side.   Past Surgical History:  Procedure Laterality Date   BREAST LUMPECTOMY WITH RADIOACTIVE SEED AND SENTINEL LYMPH NODE BIOPSY Bilateral 04/02/2020   Procedure: BILATERAL BREAST LUMPECTOMY WITH RADIOACTIVE SEED AND RIGHT SENTINEL LYMPH NODE BIOPSY AND RIGHT TARGETED AXILLARY LYMPH NODE BIOPSY;  Surgeon: Erroll Luna, MD;  Location: [place];  Service: General;  Laterality: Bilateral;   CARDIAC CATHETERIZATION  5  stents   CARDIAC CATHETERIZATION N/A 01/20/2016   Procedure: Left Heart Cath and Coronary Angiography;  Surgeon: Peter M Martinique, MD;  Location: [place] [facility];  Service: Cardiovascular;  Laterality: N/A;   CATARACT EXTRACTION W/PHACO Left 05/24/2014   Procedure: CATARACT EXTRACTION PHACO AND INTRAOCULAR LENS PLACEMENT (IOC);  Surgeon: Tonny Branch, MD;  Location: [facility];  Service: Ophthalmology;  Laterality: Left;  CDE:  9.30   CATARACT EXTRACTION W/PHACO Right 06/18/2014   Procedure: CATARACT EXTRACTION PHACO AND INTRAOCULAR LENS PLACEMENT RIGHT EYE CDE=10.84;  Surgeon: Tonny Branch, MD;  Location: [facility];  Service: Ophthalmology;  Laterality: Right;   CORONARY ARTERY BYPASS GRAFT N/A 01/24/2016   Procedure: CORONARY ARTERY BYPASS GRAFTING (CABG);  Surgeon: Gaye Pollack, MD;  Location: [place];  Service: Open Heart Surgery;  Laterality: N/A;   CORONARY STENT PLACEMENT     CORONARY STENT PLACEMENT  03/06/15   CFX DES   CYSTOSCOPY/URETEROSCOPY/HOLMIUM LASER/STENT PLACEMENT Left 09/12/2018   Procedure: CYSTOSCOPY/URETEROSCOPY/STENT PLACEMENT;  Surgeon: Ceasar Mons, MD;  Location: [facility];  Service: Urology;  Laterality: Left;   EYE SURGERY     LEFT HEART CATH AND CORS/GRAFTS ANGIOGRAPHY N/A 09/12/2019   Procedure: LEFT HEART CATH AND CORS/GRAFTS ANGIOGRAPHY;  Surgeon: Troy Sine, MD;  Location: [place] [facility];  Service: Cardiovascular;  Laterality: N/A;   LEFT HEART CATHETERIZATION WITH CORONARY ANGIOGRAM N/A 03/06/2015   Procedure: LEFT HEART CATHETERIZATION WITH CORONARY ANGIOGRAM;  Surgeon: Lorretta Harp, MD;  Location: [facility] CATH LAB;  Service: Cardiovascular;  Laterality: N/A;   PARTIAL KNEE ARTHROPLASTY Right 02/04/2015   Procedure: UNICOMPARTMENTAL KNEE;  Surgeon: Dorna Leitz, MD;  Location: [place];  Service: Orthopedics;  Laterality: Right;   PERIPHERAL VASCULAR CATHETERIZATION Bilateral 05/13/2015   Procedure: Lower Extremity Angiography;  Surgeon: Lorretta Harp, MD;   Location: [place] [facility];  Service: Cardiovascular;  Laterality: Bilateral;   PERIPHERAL VASCULAR CATHETERIZATION N/A 05/13/2015   Procedure: Abdominal Aortogram;  Surgeon: Lorretta Harp, MD;  Location: [place] [facility];  Service: Cardiovascular;  Laterality: N/A;   PERIPHERAL VASCULAR CATHETERIZATION Bilateral 06/27/2015   Procedure: Peripheral Vascular Intervention;  Surgeon: Lorretta Harp, MD;  Location: [place] [facility];  Service: Cardiovascular;  Laterality: Bilateral;  ILIACS   PERIPHERAL VASCULAR CATHETERIZATION Bilateral 06/27/2015   Procedure: Peripheral Vascular Atherectomy;  Surgeon: Lorretta Harp, MD;  Location: [place] [facility];  Service: Cardiovascular;  Laterality: Bilateral;   PORTACATH PLACEMENT Left 05/24/2020   Procedure: INSERTION PORT-A-CATH;  Surgeon: Aviva Signs, MD;  Location: [facility];  Service: General;  Laterality: Left;   TEE WITHOUT CARDIOVERSION N/A 01/24/2016   Procedure: TRANSESOPHAGEAL ECHOCARDIOGRAM (TEE);  Surgeon: Gaye Pollack, MD;  Location: [place];  Service: Open Heart Surgery;  Laterality: N/A;   TONSILLECTOMY     age 66   TUBAL LIGATION      SOCIAL HISTORY:  Social History   Socioeconomic History   Marital status: Married    Spouse name: Tommy   Number of children: 4   Years  of education: Not on file   Highest education level: Not on file  Occupational History   Occupation: retired    Comment: worked as Presenter, broadcasting for [employer]  Tobacco Use   Smoking status: Former    Packs/day: 1.50    Years: 42.00    Pack years: 63.00    Types: Cigarettes    Quit date: 05/19/1999    Years since quitting: 22.1   Smokeless tobacco: Never  Vaping Use   Vaping Use: Never used  Substance and Sexual Activity   Alcohol use: No   Drug use: No   Sexual activity: Yes    Birth control/protection: Surgical  Other Topics Concern   Not on file  Social History Narrative   Not on file   Social Determinants of Health   Financial  Resource Strain: Medium Risk   Difficulty of Paying Living Expenses: Somewhat hard  Food Insecurity: No Food Insecurity   Worried About Charity fundraiser in the Last Year: Never true   [place] in the Last Year: Never true  Transportation Needs: No Transportation Needs   Lack of Transportation (Medical): No   Lack of Transportation (Non-Medical): No  Physical Activity: Inactive   Days of Exercise per Week: 0 days   Minutes of Exercise per Session: 0 min  Stress: No Stress Concern Present   Feeling of Stress : Not at all  Social Connections: Moderately Isolated   Frequency of Communication with Friends and Family: More than three times a week   Frequency of Social Gatherings with Friends and Family: Three times a week   Attends Religious Services: Never   Active Member of Clubs or Organizations: No   Attends Music therapist: Never   Marital Status: Married  Human resources officer Violence: Not At Risk   Fear of Current or Ex-Partner: No   Emotionally Abused: No   Physically Abused: No   Sexually Abused: No    FAMILY HISTORY:  Family History  Problem Relation Age of Onset   Ovarian cancer Mother 58   Cancer Father 15       unsure of which kind, "it was in his glands"   Hypertension Maternal Grandmother    Stroke Maternal Grandfather    Hypertension Son    Brain cancer Sister    Hypertension Daughter    Heart attack Neg Hx    Colon cancer Neg Hx    Esophageal cancer Neg Hx    Inflammatory bowel disease Neg Hx    Liver disease Neg Hx    Pancreatic cancer Neg Hx    Rectal cancer Neg Hx    Stomach cancer Neg Hx     CURRENT MEDICATIONS:  Current Outpatient Medications  Medication Sig Dispense Refill   anastrozole (ARIMIDEX) 1 MG tablet TAKE 1 TABLET BY MOUTH ONCE DAILY. 30 tablet 6   aspirin EC 81 MG tablet Take 81 mg by mouth every evening.      atorvastatin (LIPITOR) 40 MG tablet Take 1 tablet (40 mg total) by mouth daily. Pt needs to make appt with  provider for further refills. 1st attempt 90 tablet 1   clopidogrel (PLAVIX) 75 MG tablet TAKE (1) TABLET BY MOUTH ONCE DAILY. 90 tablet 3   ergocalciferol (VITAMIN D2) 1.25 MG (50000 UT) capsule Take 1 capsule (50,000 Units total) by mouth once a week. 4 capsule 4   esomeprazole (NEXIUM) 20 MG capsule Take 20 mg by mouth daily.      HYDROcodone-acetaminophen (  NORCO/VICODIN) 5-325 MG tablet      levothyroxine (SYNTHROID, LEVOTHROID) 50 MCG tablet Take 50 mcg by mouth daily before breakfast.     lidocaine-prilocaine (EMLA) cream Apply to affected area once 30 g 3   metoprolol tartrate (LOPRESSOR) 25 MG tablet Take 1 tablet (25 mg total) by mouth 2 (two) times daily. 180 tablet 3   nitroGLYCERIN (NITROSTAT) 0.4 MG SL tablet Place 1 tablet (0.4 mg total) under the tongue every 5 (five) minutes as needed for chest pain. 25 tablet prn   olmesartan (BENICAR) 5 MG tablet 1 tablet     Pembrolizumab (KEYTRUDA IV) Inject 200 mg into the vein every 21 ( twenty-one) days.      pembrolizumab (KEYTRUDA) 100 MG/4ML SOLN See admin instructions.     hydrOXYzine (ATARAX/VISTARIL) 25 MG tablet Take 1 tablet (25 mg total) by mouth at bedtime as needed. (Patient not taking: Reported on 06/30/2021) 30 tablet 3   prochlorperazine (COMPAZINE) 10 MG tablet Take 1 tablet (10 mg total) by mouth every 6 (six) hours as needed (Nausea or vomiting). (Patient not taking: Reported on 06/30/2021) 30 tablet 1   No current facility-administered medications for this visit.    ALLERGIES:  Allergies  Allergen Reactions   Crab [Shellfish Allergy] Nausea And Vomiting    Throws up violently   Ezetimibe Diarrhea   Other Nausea And Vomiting    Shrimp    PHYSICAL EXAM:  Performance status (ECOG): 1 - Symptomatic but completely ambulatory  Vitals:   06/30/21 1228  BP: 135/64  Pulse: 77  Resp: 18  Temp: (!) 97 F (36.1 C)  SpO2: 96%   Wt Readings from Last 3 Encounters:  06/30/21 162 lb (73.5 kg)  06/09/21 159 lb 8 oz (72.3  kg)  05/19/21 161 lb (73 kg)   Physical Exam Vitals reviewed.  Constitutional:      Appearance: Normal appearance.  Cardiovascular:     Rate and Rhythm: Normal rate and regular rhythm.     Pulses: Normal pulses.     Heart sounds: Normal heart sounds.  Pulmonary:     Effort: Pulmonary effort is normal.     Breath sounds: Normal breath sounds.  Chest:  Breasts:    Right: No axillary adenopathy.     Left: No axillary adenopathy.  Lymphadenopathy:     Upper Body:     Right upper body: No axillary or pectoral adenopathy.     Left upper body: No axillary or pectoral adenopathy.  Neurological:     General: No focal deficit present.     Mental Status: She is alert and oriented to person, place, and time.  Psychiatric:        Mood and Affect: Mood normal.        Behavior: Behavior normal.    LABORATORY DATA:  I have reviewed the labs as listed.  CBC Latest Ref Rng & Units 06/30/2021 06/09/2021 05/19/2021  WBC 4.0 - 10.5 K/uL 5.6 5.5 5.0  Hemoglobin 12.0 - 15.0 g/dL 13.3 13.0 13.2  Hematocrit 36.0 - 46.0 % 42.6 41.4 41.8  Platelets 150 - 400 K/uL 169 164 162   CMP Latest Ref Rng & Units 06/09/2021 05/19/2021 04/28/2021  Glucose 70 - 99 mg/dL 116(H) 128(H) 126(H)  BUN 8 - 23 mg/dL 27(H) 18 21  Creatinine 0.44 - 1.00 mg/dL 1.73(H) 1.40(H) 1.41(H)  Sodium 135 - 145 mmol/L 140 138 141  Potassium 3.5 - 5.1 mmol/L 4.1 4.0 4.0  Chloride 98 - 111 mmol/L 107 106 108  CO2 22 - 32 mmol/L _0 Calcium 8.9 - 10.3 mg/dL 9.1 9.3 9.4  Total Protein 6.5 - 8.1 g/dL 6.2(L) 6.3(L) 6.3(L)  Total Bilirubin 0.3 - 1.2 mg/dL 0.5 0.6 0.7  Alkaline Phos 38 - 126 U/L 79 86 79  AST 15 - 41 U/L _1 ALT 0 - 44 U/L _2 DIAGNOSTIC IMAGING:  I have independently reviewed the scans and discussed with the patient. No results found.   ASSESSMENT:  1.  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  Right  hemicolectomy on 10/11/2019, grade 3 adenocarcinoma, negative margins, positive LVSI, 2 tumor deposits, 0/15 lymph nodes positive, PT3PN1C. 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  MLH1 hyper methylation present. -PET scan in December 2020 showed lymph node in the right axillary region.  Biopsy consistent with metastatic colon cancer. -Last CEA was 10.7 on 11/22/2019. -Right axillary lymph node excision on 04/02/2020 consistent with metastatic carcinoma from colon cancer. -Pembrolizumab started on 05/14/2020. -PET scan on 08/05/2020 shows complete resolution of lymphadenopathy.  No evidence of new areas of uptake.  Mild vague residual hypermetabolism in the right axilla without soft tissue mass. -PET scan on 12/23/2020 with no evidence of recurrence.   2.  Stage I (PT1CPN0) right Breast, Grade 2 Invasive Lobular Carcinoma: -MRI of the breast on 12/25/2019 showed 1 cm mass behind the right areola with a suspicious mass in the left breast. -Right breast lumpectomy on 04/02/2020 shows invasive lobular carcinoma, grade 2, 1.2 cm.  Resection margins are negative.  Negative LVSI.  2 sentinel lymph nodes were negative for carcinoma.  ER/PR 100% positive, HER-2 negative, Ki-67 10%.   PLAN:  1.  Stage IV colon cancer to the right axillary lymph node: - We reviewed CT CAP from 04/18/2021 which did not show any evidence of recurrence or metastatic disease in the chest, abdomen or pelvis. - No palpable adenopathy today in the left axillary region. - Denies any immunotherapy related side effects.  She does have occasional diarrhea once every 2 weeks. - Labs reviewed today shows normal LFTs and CBC.  CEA has improved to 7.0 from 10.6 previously. - TSH today is 2.04, improved from 0.34 previously. - Continue Synthroid 50 mcg daily.  Continue treatment today and in 3 weeks.  RTC 6 weeks for follow-up.  We will plan to do a CT CAP prior to her next visit with oral contrast.   2.  Right breast invasive lobular  carcinoma, grade 2: - Continue anastrozole daily.  She does report some arthritic pains in the small joints of the hands since she was started on anastrozole.  They are tolerable.   3.  Osteoporosis: - DEXA scan showed T score -2.6. - Continue vitamin D 50,000 units daily.   4.  Bladder cancer: - Continue follow-up with urology at [facility].   5.  Elevated creatinine: - Creatinine is elevated at 1.70 today and 1.73 on 06/09/2021.  Prior to that it was 1.4. - She will receive final mL of normal saline today.   6.  Generalized pruritus: - Itching is predominantly at nighttime. - Continue moisturizing lotion 2-3 times daily.  Continue Atarax 25 mg at bedtime which is helping.   Orders placed this encounter:  No orders of the defined types were placed in this encounter.    Derek Jack, MD [place] [phone]  I, Thana Ates, am acting as a Education administrator for Dr. Derek Jack.  I, Derek Jack MD, have reviewed the above documentation for accuracy and completeness, and I agree with the above.

## 2021-06-30 ENCOUNTER — Inpatient Hospital Stay (HOSPITAL_COMMUNITY): Payer: Medicare Other | Attending: Hematology

## 2021-06-30 ENCOUNTER — Inpatient Hospital Stay (HOSPITAL_BASED_OUTPATIENT_CLINIC_OR_DEPARTMENT_OTHER): Payer: Medicare Other | Admitting: Hematology

## 2021-06-30 ENCOUNTER — Inpatient Hospital Stay (HOSPITAL_COMMUNITY): Payer: Medicare Other

## 2021-06-30 ENCOUNTER — Other Ambulatory Visit: Payer: Self-pay

## 2021-06-30 VITALS — BP 160/51 | HR 47 | Temp 96.7°F | Resp 18

## 2021-06-30 VITALS — BP 135/64 | HR 77 | Temp 97.0°F | Resp 18 | Wt 162.0 lb

## 2021-06-30 DIAGNOSIS — Z79811 Long term (current) use of aromatase inhibitors: Secondary | ICD-10-CM | POA: Insufficient documentation

## 2021-06-30 DIAGNOSIS — I10 Essential (primary) hypertension: Secondary | ICD-10-CM | POA: Diagnosis not present

## 2021-06-30 DIAGNOSIS — C182 Malignant neoplasm of ascending colon: Secondary | ICD-10-CM | POA: Diagnosis not present

## 2021-06-30 DIAGNOSIS — Z7982 Long term (current) use of aspirin: Secondary | ICD-10-CM | POA: Insufficient documentation

## 2021-06-30 DIAGNOSIS — Z87891 Personal history of nicotine dependence: Secondary | ICD-10-CM | POA: Insufficient documentation

## 2021-06-30 DIAGNOSIS — Z170 Estrogen receptor positive status [ER+]: Secondary | ICD-10-CM | POA: Insufficient documentation

## 2021-06-30 DIAGNOSIS — L299 Pruritus, unspecified: Secondary | ICD-10-CM | POA: Insufficient documentation

## 2021-06-30 DIAGNOSIS — C50012 Malignant neoplasm of nipple and areola, left female breast: Secondary | ICD-10-CM | POA: Diagnosis not present

## 2021-06-30 DIAGNOSIS — Z5112 Encounter for antineoplastic immunotherapy: Secondary | ICD-10-CM | POA: Insufficient documentation

## 2021-06-30 DIAGNOSIS — Z7902 Long term (current) use of antithrombotics/antiplatelets: Secondary | ICD-10-CM | POA: Diagnosis not present

## 2021-06-30 DIAGNOSIS — C50911 Malignant neoplasm of unspecified site of right female breast: Secondary | ICD-10-CM

## 2021-06-30 DIAGNOSIS — Z79899 Other long term (current) drug therapy: Secondary | ICD-10-CM | POA: Diagnosis not present

## 2021-06-30 DIAGNOSIS — E039 Hypothyroidism, unspecified: Secondary | ICD-10-CM | POA: Diagnosis not present

## 2021-06-30 DIAGNOSIS — Z8551 Personal history of malignant neoplasm of bladder: Secondary | ICD-10-CM | POA: Insufficient documentation

## 2021-06-30 DIAGNOSIS — C773 Secondary and unspecified malignant neoplasm of axilla and upper limb lymph nodes: Secondary | ICD-10-CM | POA: Insufficient documentation

## 2021-06-30 DIAGNOSIS — M810 Age-related osteoporosis without current pathological fracture: Secondary | ICD-10-CM | POA: Diagnosis not present

## 2021-06-30 LAB — COMPREHENSIVE METABOLIC PANEL
ALT: 17 U/L (ref 0–44)
AST: 19 U/L (ref 15–41)
Albumin: 3.8 g/dL (ref 3.5–5.0)
Alkaline Phosphatase: 82 U/L (ref 38–126)
Anion gap: 6 (ref 5–15)
BUN: 21 mg/dL (ref 8–23)
CO2: 24 mmol/L (ref 22–32)
Calcium: 9.4 mg/dL (ref 8.9–10.3)
Chloride: 108 mmol/L (ref 98–111)
Creatinine, Ser: 1.7 mg/dL — ABNORMAL HIGH (ref 0.44–1.00)
GFR, Estimated: 30 mL/min — ABNORMAL LOW (ref >60)
Glucose, Bld: 102 mg/dL — ABNORMAL HIGH (ref 70–99)
Potassium: 3.8 mmol/L (ref 3.5–5.1)
Sodium: 138 mmol/L (ref 135–145)
Total Bilirubin: 0.8 mg/dL (ref 0.3–1.2)
Total Protein: 6.5 g/dL (ref 6.5–8.1)

## 2021-06-30 LAB — CBC WITH DIFFERENTIAL/PLATELET
Abs Immature Granulocytes: 0.01 10*3/uL (ref 0.00–0.07)
Basophils Absolute: 0.1 10*3/uL (ref 0.0–0.1)
Basophils Relative: 1 %
Eosinophils Absolute: 0 10*3/uL (ref 0.0–0.5)
Eosinophils Relative: 1 %
HCT: 42.6 % (ref 36.0–46.0)
Hemoglobin: 13.3 g/dL (ref 12.0–15.0)
Immature Granulocytes: 0 %
Lymphocytes Relative: 20 %
Lymphs Abs: 1.1 10*3/uL (ref 0.7–4.0)
MCH: 28.9 pg (ref 26.0–34.0)
MCHC: 31.2 g/dL (ref 30.0–36.0)
MCV: 92.6 fL (ref 80.0–100.0)
Monocytes Absolute: 0.5 10*3/uL (ref 0.1–1.0)
Monocytes Relative: 8 %
Neutro Abs: 3.9 10*3/uL (ref 1.7–7.7)
Neutrophils Relative %: 70 %
Platelets: 169 10*3/uL (ref 150–400)
RBC: 4.6 MIL/uL (ref 3.87–5.11)
RDW: 14.2 % (ref 11.5–15.5)
WBC: 5.6 10*3/uL (ref 4.0–10.5)
nRBC: 0 % (ref 0.0–0.2)

## 2021-06-30 LAB — TSH: TSH: 2.045 u[IU]/mL (ref 0.350–4.500)

## 2021-06-30 MED ORDER — SODIUM CHLORIDE 0.9 % IV SOLN: Freq: Once | INTRAVENOUS | Status: AC

## 2021-06-30 MED ORDER — HEPARIN SOD (PORK) LOCK FLUSH 100 UNIT/ML IV SOLN
500.0000 [IU] | Freq: Once | INTRAVENOUS | Status: AC | PRN
  Administered 2021-06-30: 500 [IU]

## 2021-06-30 MED ORDER — SODIUM CHLORIDE 0.9% FLUSH
10.0000 mL | INTRAVENOUS | Status: DC | PRN
  Administered 2021-06-30: 10 mL

## 2021-06-30 MED ORDER — SODIUM CHLORIDE 0.9 % IV SOLN
200.0000 mg | Freq: Once | INTRAVENOUS | Status: AC
  Administered 2021-06-30: 200 mg via INTRAVENOUS
  Filled 2021-06-30: qty 8

## 2021-06-30 NOTE — Patient Instructions (Signed)
[facility]  Discharge Instructions: Thank you for choosing [facility] to provide your oncology and hematology care.  If you have a lab appointment with the Cancer Center, please come in thru the Main Entrance and check in at the main information desk.  Wear comfortable clothing and clothing appropriate for easy access to any Portacath or PICC line.   We strive to give you quality time with your provider. You may need to reschedule your appointment if you arrive late (15 or more minutes).  Arriving late affects you and other patients whose appointments are after yours.  Also, if you miss three or more appointments without notifying the office, you may be dismissed from the clinic at the provider's discretion.      For prescription refill requests, have your pharmacy contact our office and allow 72 hours for refills to be completed.        To help prevent nausea and vomiting after your treatment, we encourage you to take your nausea medication as directed.  BELOW ARE SYMPTOMS THAT SHOULD BE REPORTED IMMEDIATELY: *FEVER GREATER THAN 100.4 F (38 C) OR HIGHER *CHILLS OR SWEATING *NAUSEA AND VOMITING THAT IS NOT CONTROLLED WITH YOUR NAUSEA MEDICATION *UNUSUAL SHORTNESS OF BREATH *UNUSUAL BRUISING OR BLEEDING *URINARY PROBLEMS (pain or burning when urinating, or frequent urination) *BOWEL PROBLEMS (unusual diarrhea, constipation, pain near the anus) TENDERNESS IN MOUTH AND THROAT WITH OR WITHOUT PRESENCE OF ULCERS (sore throat, sores in mouth, or a toothache) UNUSUAL RASH, SWELLING OR PAIN  UNUSUAL VAGINAL DISCHARGE OR ITCHING   Items with * indicate a potential emergency and should be followed up as soon as possible or go to the Emergency Department if any problems should occur.  Please show the CHEMOTHERAPY ALERT CARD or IMMUNOTHERAPY ALERT CARD at check-in to the Emergency Department and triage nurse.  Should you have questions after your visit or need to cancel  or reschedule your appointment, please contact [facility] [phone]  and follow the prompts.  Office hours are 8:00 a.m. to 4:30 p.m. Monday - Friday. Please note that voicemails left after 4:00 p.m. may not be returned until the following business day.  We are closed weekends and major holidays. You have access to a nurse at all times for urgent questions. Please call the main number to the clinic [phone] and follow the prompts.  For any non-urgent questions, you may also contact your provider using MyChart. We now offer e-Visits for anyone 18 and older to request care online for non-urgent symptoms. For details visit [email or web address].   Also download the MyChart app! Go to the app store, search "MyChart", open the app, select [facility], and log in with your MyChart username and password.  Due to Covid, a mask is required upon entering the hospital/clinic. If you do not have a mask, one will be given to you upon arrival. For doctor visits, patients may have 1 support person aged 18 or older with them. For treatment visits, patients cannot have anyone with them due to current Covid guidelines and our immunocompromised population.  

## 2021-06-30 NOTE — Progress Notes (Signed)
Patient has been assessed, vital signs and labs have been reviewed by Dr. Delton Coombes. ANC, Creatinine (1.7), LFTs, and Platelets are within treatment parameters per Dr. Delton Coombes. The patient is good to proceed with treatment at this time.  0.9 NS over I hour 500 ml. Primary RN and pharmacy aware.

## 2021-06-30 NOTE — Progress Notes (Signed)
Patient presents today for treatment and follow up visit with Dr. Delton Coombes. Vital signs within parameters for treatment. Creatinine today 1.70 and reported to Dr. Delton Coombes through secure message.   Message received from [facility] / Dr. Delton Coombes to proceed with treatment. Labs reviewed. Message received to infuse 500 mls of NS with treatment.

## 2021-06-30 NOTE — Progress Notes (Signed)
Treatment given per orders. Patient tolerated it well without problems. Vitals stable and discharged home from clinic ambulatory. Follow up as scheduled.  

## 2021-06-30 NOTE — Patient Instructions (Addendum)
[place] at [facility] Discharge Instructions  You were seen today by Dr. Delton Coombes. He went over your recent results, and you received your treatment. You will be scheduled for a CT scan of your chest, abdomen, and pelvis prior to your next appointment. Dr. Delton Coombes will see you back in 6 weeks for labs and follow up.   Thank you for choosing [place] at [facility] to provide your oncology and hematology care.  To afford each patient quality time with our provider, please arrive at least 15 minutes before your scheduled appointment time.   If you have a lab appointment with the [facility] please come in thru the Main Entrance and check in at the main information desk  You need to re-schedule your appointment should you arrive 10 or more minutes late.  We strive to give you quality time with our providers, and arriving late affects you and other patients whose appointments are after yours.  Also, if you no show three or more times for appointments you may be dismissed from the clinic at the providers discretion.     Again, thank you for choosing [facility].  Our hope is that these requests will decrease the amount of time that you wait before being seen by our physicians.       _____________________________________________________________  Should you have questions after your visit to [facility], please contact our office at [phone] between the hours of 8:00 a.m. and 4:30 p.m.  Voicemails left after 4:00 p.m. will not be returned until the following business day.  For prescription refill requests, have your pharmacy contact our office and allow 72 hours.    Cancer Center Support Programs:   > Cancer Support Group  2nd Tuesday of the month 1pm-2pm, Journey Room

## 2021-07-21 ENCOUNTER — Inpatient Hospital Stay (HOSPITAL_COMMUNITY): Payer: Medicare Other

## 2021-07-21 VITALS — BP 167/71 | HR 48 | Temp 97.9°F | Resp 18 | Wt 161.6 lb

## 2021-07-21 DIAGNOSIS — Z7902 Long term (current) use of antithrombotics/antiplatelets: Secondary | ICD-10-CM | POA: Diagnosis not present

## 2021-07-21 DIAGNOSIS — I10 Essential (primary) hypertension: Secondary | ICD-10-CM | POA: Diagnosis not present

## 2021-07-21 DIAGNOSIS — L299 Pruritus, unspecified: Secondary | ICD-10-CM | POA: Diagnosis not present

## 2021-07-21 DIAGNOSIS — Z8551 Personal history of malignant neoplasm of bladder: Secondary | ICD-10-CM | POA: Diagnosis not present

## 2021-07-21 DIAGNOSIS — Z79899 Other long term (current) drug therapy: Secondary | ICD-10-CM | POA: Diagnosis not present

## 2021-07-21 DIAGNOSIS — C182 Malignant neoplasm of ascending colon: Secondary | ICD-10-CM

## 2021-07-21 DIAGNOSIS — Z5112 Encounter for antineoplastic immunotherapy: Secondary | ICD-10-CM | POA: Diagnosis not present

## 2021-07-21 DIAGNOSIS — Z7982 Long term (current) use of aspirin: Secondary | ICD-10-CM | POA: Diagnosis not present

## 2021-07-21 DIAGNOSIS — C773 Secondary and unspecified malignant neoplasm of axilla and upper limb lymph nodes: Secondary | ICD-10-CM | POA: Diagnosis not present

## 2021-07-21 DIAGNOSIS — Z79811 Long term (current) use of aromatase inhibitors: Secondary | ICD-10-CM | POA: Diagnosis not present

## 2021-07-21 DIAGNOSIS — C50911 Malignant neoplasm of unspecified site of right female breast: Secondary | ICD-10-CM

## 2021-07-21 DIAGNOSIS — M810 Age-related osteoporosis without current pathological fracture: Secondary | ICD-10-CM | POA: Diagnosis not present

## 2021-07-21 DIAGNOSIS — C50012 Malignant neoplasm of nipple and areola, left female breast: Secondary | ICD-10-CM | POA: Diagnosis not present

## 2021-07-21 DIAGNOSIS — Z87891 Personal history of nicotine dependence: Secondary | ICD-10-CM | POA: Diagnosis not present

## 2021-07-21 DIAGNOSIS — Z170 Estrogen receptor positive status [ER+]: Secondary | ICD-10-CM | POA: Diagnosis not present

## 2021-07-21 DIAGNOSIS — E039 Hypothyroidism, unspecified: Secondary | ICD-10-CM | POA: Diagnosis not present

## 2021-07-21 LAB — COMPREHENSIVE METABOLIC PANEL
ALT: 14 U/L (ref 0–44)
AST: 20 U/L (ref 15–41)
Albumin: 4.1 g/dL (ref 3.5–5.0)
Alkaline Phosphatase: 88 U/L (ref 38–126)
Anion gap: 6 (ref 5–15)
BUN: 21 mg/dL (ref 8–23)
CO2: 25 mmol/L (ref 22–32)
Calcium: 9.3 mg/dL (ref 8.9–10.3)
Chloride: 108 mmol/L (ref 98–111)
Creatinine, Ser: 1.45 mg/dL — ABNORMAL HIGH (ref 0.44–1.00)
GFR, Estimated: 36 mL/min — ABNORMAL LOW (ref >60)
Glucose, Bld: 107 mg/dL — ABNORMAL HIGH (ref 70–99)
Potassium: 4.3 mmol/L (ref 3.5–5.1)
Sodium: 139 mmol/L (ref 135–145)
Total Bilirubin: 0.8 mg/dL (ref 0.3–1.2)
Total Protein: 7.1 g/dL (ref 6.5–8.1)

## 2021-07-21 LAB — CBC WITH DIFFERENTIAL/PLATELET
Abs Immature Granulocytes: 0.01 10*3/uL (ref 0.00–0.07)
Basophils Absolute: 0.1 10*3/uL (ref 0.0–0.1)
Basophils Relative: 1 %
Eosinophils Absolute: 0 10*3/uL (ref 0.0–0.5)
Eosinophils Relative: 0 %
HCT: 44 % (ref 36.0–46.0)
Hemoglobin: 13.8 g/dL (ref 12.0–15.0)
Immature Granulocytes: 0 %
Lymphocytes Relative: 24 %
Lymphs Abs: 1.3 10*3/uL (ref 0.7–4.0)
MCH: 28.8 pg (ref 26.0–34.0)
MCHC: 31.4 g/dL (ref 30.0–36.0)
MCV: 91.9 fL (ref 80.0–100.0)
Monocytes Absolute: 0.5 10*3/uL (ref 0.1–1.0)
Monocytes Relative: 9 %
Neutro Abs: 3.6 10*3/uL (ref 1.7–7.7)
Neutrophils Relative %: 66 %
Platelets: 170 10*3/uL (ref 150–400)
RBC: 4.79 MIL/uL (ref 3.87–5.11)
RDW: 14 % (ref 11.5–15.5)
WBC: 5.5 10*3/uL (ref 4.0–10.5)
nRBC: 0 % (ref 0.0–0.2)

## 2021-07-21 LAB — TSH: TSH: 0.643 u[IU]/mL (ref 0.350–4.500)

## 2021-07-21 MED ORDER — SODIUM CHLORIDE 0.9% FLUSH
10.0000 mL | INTRAVENOUS | Status: DC | PRN
  Administered 2021-07-21: 10 mL

## 2021-07-21 MED ORDER — HEPARIN SOD (PORK) LOCK FLUSH 100 UNIT/ML IV SOLN
500.0000 [IU] | Freq: Once | INTRAVENOUS | Status: AC | PRN
  Administered 2021-07-21: 500 [IU]

## 2021-07-21 MED ORDER — SODIUM CHLORIDE 0.9 % IV SOLN: Freq: Once | INTRAVENOUS | Status: AC

## 2021-07-21 MED ORDER — SODIUM CHLORIDE 0.9 % IV SOLN
200.0000 mg | Freq: Once | INTRAVENOUS | Status: AC
  Administered 2021-07-21: 200 mg via INTRAVENOUS
  Filled 2021-07-21: qty 8

## 2021-07-21 NOTE — Progress Notes (Signed)
Pt presents today for Keytruda per provider's order. Vital signs and labs are within parameters for treatment. Pt voiced no new complaints at this time.  Keytruda given today per MD orders. Tolerated infusion without adverse affects. Vital signs stable. No complaints at this time. Discharged from clinic ambulatory in stable condition. Alert and oriented x 3. F/U with [facility] as scheduled.

## 2021-07-21 NOTE — Patient Instructions (Signed)
[facility]  Discharge Instructions: Thank you for choosing [facility] to provide your oncology and hematology care.  If you have a lab appointment with the Cancer Center, please come in thru the Main Entrance and check in at the main information desk.  Wear comfortable clothing and clothing appropriate for easy access to any Portacath or PICC line.   We strive to give you quality time with your provider. You may need to reschedule your appointment if you arrive late (15 or more minutes).  Arriving late affects you and other patients whose appointments are after yours.  Also, if you miss three or more appointments without notifying the office, you may be dismissed from the clinic at the provider's discretion.      For prescription refill requests, have your pharmacy contact our office and allow 72 hours for refills to be completed.    Today you received the following chemotherapy and/or immunotherapy agents Keytruda       To help prevent nausea and vomiting after your treatment, we encourage you to take your nausea medication as directed.  BELOW ARE SYMPTOMS THAT SHOULD BE REPORTED IMMEDIATELY: *FEVER GREATER THAN 100.4 F (38 C) OR HIGHER *CHILLS OR SWEATING *NAUSEA AND VOMITING THAT IS NOT CONTROLLED WITH YOUR NAUSEA MEDICATION *UNUSUAL SHORTNESS OF BREATH *UNUSUAL BRUISING OR BLEEDING *URINARY PROBLEMS (pain or burning when urinating, or frequent urination) *BOWEL PROBLEMS (unusual diarrhea, constipation, pain near the anus) TENDERNESS IN MOUTH AND THROAT WITH OR WITHOUT PRESENCE OF ULCERS (sore throat, sores in mouth, or a toothache) UNUSUAL RASH, SWELLING OR PAIN  UNUSUAL VAGINAL DISCHARGE OR ITCHING   Items with * indicate a potential emergency and should be followed up as soon as possible or go to the Emergency Department if any problems should occur.  Please show the CHEMOTHERAPY ALERT CARD or IMMUNOTHERAPY ALERT CARD at check-in to the Emergency  Department and triage nurse.  Should you have questions after your visit or need to cancel or reschedule your appointment, please contact  CANCER CENTER [phone]  and follow the prompts.  Office hours are 8:00 a.m. to 4:30 p.m. Monday - Friday. Please note that voicemails left after 4:00 p.m. may not be returned until the following business day.  We are closed weekends and major holidays. You have access to a nurse at all times for urgent questions. Please call the main number to the clinic [phone] and follow the prompts.  For any non-urgent questions, you may also contact your provider using MyChart. We now offer e-Visits for anyone 18 and older to request care online for non-urgent symptoms. For details visit [email or web address].   Also download the MyChart app! Go to the app store, search "MyChart", open the app, select [facility], and log in with your MyChart username and password.  Due to Covid, a mask is required upon entering the hospital/clinic. If you do not have a mask, one will be given to you upon arrival. For doctor visits, patients may have 1 support person aged 18 or older with them. For treatment visits, patients cannot have anyone with them due to current Covid guidelines and our immunocompromised population.  

## 2021-07-22 LAB — CEA: CEA: 9.7 ng/mL — ABNORMAL HIGH (ref 0.0–4.7)

## 2021-08-08 ENCOUNTER — Other Ambulatory Visit: Payer: Self-pay

## 2021-08-08 ENCOUNTER — Ambulatory Visit (HOSPITAL_COMMUNITY)
Admission: RE | Admit: 2021-08-08 | Discharge: 2021-08-08 | Disposition: A | Discharge status: Home / Self Care | Payer: Medicare Other | Source: Ambulatory Visit | Attending: Hematology | Admitting: Hematology

## 2021-08-08 DIAGNOSIS — C189 Malignant neoplasm of colon, unspecified: Secondary | ICD-10-CM | POA: Diagnosis not present

## 2021-08-08 DIAGNOSIS — K5730 Diverticulosis of large intestine without perforation or abscess without bleeding: Secondary | ICD-10-CM | POA: Diagnosis not present

## 2021-08-08 DIAGNOSIS — C182 Malignant neoplasm of ascending colon: Secondary | ICD-10-CM | POA: Diagnosis not present

## 2021-08-08 DIAGNOSIS — I700 Atherosclerosis of aorta: Secondary | ICD-10-CM | POA: Diagnosis not present

## 2021-08-08 DIAGNOSIS — C50919 Malignant neoplasm of unspecified site of unspecified female breast: Secondary | ICD-10-CM | POA: Diagnosis not present

## 2021-08-08 DIAGNOSIS — C50911 Malignant neoplasm of unspecified site of right female breast: Secondary | ICD-10-CM | POA: Insufficient documentation

## 2021-08-08 DIAGNOSIS — I2510 Atherosclerotic heart disease of native coronary artery without angina pectoris: Secondary | ICD-10-CM | POA: Diagnosis not present

## 2021-08-11 NOTE — Progress Notes (Signed)
[place], [place] [address]   CLINIC:  Medical Oncology/Hematology  PCP:  Antony Contras, MD [address] / [place] [address] [phone]   REASON FOR VISIT:  Follow-up for stage I right breast cancer and colon cancer  PRIOR THERAPY:  1. Robotic proximal colectomy on 10/11/2019. 2. Right breast lumpectomy and SLNB on 04/02/2020  NGS Results: MSI--high, Foundation 1 not sent  CURRENT THERAPY: Keytruda every 3 weeks  BRIEF ONCOLOGIC HISTORY:  Oncology History  Cancer of ascending colon s/p robotic proximal colectomy 10/11/2019  10/11/2019 Initial Diagnosis   Cancer of ascending colon s/p robotic proximal colectomy 10/11/2019   10/23/2019 Cancer Staging   Staging form: Colon and Rectum, AJCC 8th Edition - Clinical stage from 10/23/2019: Stage IVA (cT3, cN1c, cM1a) - Signed by Derek Jack, MD on 12/14/2019   05/14/2020 -  Chemotherapy    Patient is on Treatment Plan: [place] Q21D       Malignant neoplasm of right female breast ([place])  04/23/2020 Initial Diagnosis   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 [facility])   04/23/2020 Cancer Staging   Staging form: Breast, AJCC 8th Edition - Clinical stage from 04/23/2020: Stage IA (cT1c, cN0(sn), cM0, G2, ER+, PR+, HER2-) - Signed by Derek Jack, MD on 04/23/2020     CANCER STAGING: Cancer Staging Cancer of ascending colon s/p robotic proximal colectomy 10/11/2019 Staging form: Colon and Rectum, AJCC 8th Edition - Clinical stage from 10/23/2019: Stage IVA (cT3, cN1c, cM1a) - Signed by Derek Jack, MD on 12/14/2019  Malignant neoplasm of right female breast [facility]) Staging form: Breast, AJCC 8th Edition - Clinical stage from 04/23/2020: Stage IA (cT1c, cN0(sn), cM0, G2, ER+, PR+, HER2-) - Signed by Derek Jack, MD on 04/23/2020   INTERVAL HISTORY:  Jessica Taylor, a 80 y.o. female, returns for routine  follow-up and consideration for next cycle of chemotherapy. Jessica Taylor was last seen on 06/30/2021.  Due for cycle #22 of Keytruda today.   Overall, she tells me she has been feeling pretty well. She reports constipation, and her last BM was 2 days ago. She also reports diarrhea with the last episode 2 days ago. She reports that her left breast is sore to the touch. She reports that her daily fluid intake consists of 32 ounces of lemonade.   Overall, she is not ready for next cycle of chemo today.   REVIEW OF SYSTEMS:  Review of Systems  Constitutional:  Negative for appetite change (75%) and fatigue (90%).  Cardiovascular:  Positive for chest pain (L breast sore) and leg swelling.  Gastrointestinal:  Positive for constipation (last BM 2 days ago) and diarrhea.  Psychiatric/Behavioral:  The patient is nervous/anxious.   All other systems reviewed and are negative.  PAST MEDICAL/SURGICAL HISTORY:  Past Medical History:  Diagnosis Date   Abdominal aortic aneurysm (HCC)    Acid reflux    Anemia    Arthritis    knees , R shoulder - tx /w injection - 11/2014   Basal cell carcinoma (BCC) of dorsum of nose 2010   Resolved   Cancer ([place])    basal cell on nose   Colon cancer (HCC)    Coronary artery disease    Hypercholesteremia    Hypertension    Hypothyroidism    Lt Acute pyelonephritis 09/11/2018   MI, old 2000   Peripheral arterial disease ([place])    hhigh-grade ostial bilateral calcified iliac stenosis with claudication  Tobacco abuse    Vertigo    when lays on left side.   Past Surgical History:  Procedure Laterality Date   BREAST LUMPECTOMY WITH RADIOACTIVE SEED AND SENTINEL LYMPH NODE BIOPSY Bilateral 04/02/2020   Procedure: BILATERAL BREAST LUMPECTOMY WITH RADIOACTIVE SEED AND RIGHT SENTINEL LYMPH NODE BIOPSY AND RIGHT TARGETED AXILLARY LYMPH NODE BIOPSY;  Surgeon: Erroll Luna, MD;  Location: [facility];  Service: General;  Laterality: Bilateral;   CARDIAC  CATHETERIZATION  5 stents   CARDIAC CATHETERIZATION N/A 01/20/2016   Procedure: Left Heart Cath and Coronary Angiography;  Surgeon: Peter M Martinique, MD;  Location: [facility];  Service: Cardiovascular;  Laterality: N/A;   CATARACT EXTRACTION W/PHACO Left 05/24/2014   Procedure: CATARACT EXTRACTION PHACO AND INTRAOCULAR LENS PLACEMENT (IOC);  Surgeon: Tonny Branch, MD;  Location: [facility];  Service: Ophthalmology;  Laterality: Left;  CDE:  9.30   CATARACT EXTRACTION W/PHACO Right 06/18/2014   Procedure: CATARACT EXTRACTION PHACO AND INTRAOCULAR LENS PLACEMENT RIGHT EYE CDE=10.84;  Surgeon: Tonny Branch, MD;  Location: [facility];  Service: Ophthalmology;  Laterality: Right;   CORONARY ARTERY BYPASS GRAFT N/A 01/24/2016   Procedure: CORONARY ARTERY BYPASS GRAFTING (CABG);  Surgeon: Gaye Pollack, MD;  Location: [facility];  Service: Open Heart Surgery;  Laterality: N/A;   CORONARY STENT PLACEMENT     CORONARY STENT PLACEMENT  03/06/15   CFX DES   CYSTOSCOPY/URETEROSCOPY/HOLMIUM LASER/STENT PLACEMENT Left 09/12/2018   Procedure: CYSTOSCOPY/URETEROSCOPY/STENT PLACEMENT;  Surgeon: Ceasar Mons, MD;  Location: [facility];  Service: Urology;  Laterality: Left;   EYE SURGERY     LEFT HEART CATH AND CORS/GRAFTS ANGIOGRAPHY N/A 09/12/2019   Procedure: LEFT HEART CATH AND CORS/GRAFTS ANGIOGRAPHY;  Surgeon: Troy Sine, MD;  Location: [facility];  Service: Cardiovascular;  Laterality: N/A;   LEFT HEART CATHETERIZATION WITH CORONARY ANGIOGRAM N/A 03/06/2015   Procedure: LEFT HEART CATHETERIZATION WITH CORONARY ANGIOGRAM;  Surgeon: Lorretta Harp, MD;  Location: [facility];  Service: Cardiovascular;  Laterality: N/A;   PARTIAL KNEE ARTHROPLASTY Right 02/04/2015   Procedure: UNICOMPARTMENTAL KNEE;  Surgeon: Dorna Leitz, MD;  Location: [facility];  Service: Orthopedics;  Laterality: Right;   PERIPHERAL VASCULAR CATHETERIZATION Bilateral 05/13/2015   Procedure: Lower Extremity Angiography;  Surgeon: Lorretta Harp, MD;  Location: [facility];  Service: Cardiovascular;  Laterality: Bilateral;   PERIPHERAL VASCULAR CATHETERIZATION N/A 05/13/2015   Procedure: Abdominal Aortogram;  Surgeon: Lorretta Harp, MD;  Location: [facility];  Service: Cardiovascular;  Laterality: N/A;   PERIPHERAL VASCULAR CATHETERIZATION Bilateral 06/27/2015   Procedure: Peripheral Vascular Intervention;  Surgeon: Lorretta Harp, MD;  Location: [facility];  Service: Cardiovascular;  Laterality: Bilateral;  ILIACS   PERIPHERAL VASCULAR CATHETERIZATION Bilateral 06/27/2015   Procedure: Peripheral Vascular Atherectomy;  Surgeon: Lorretta Harp, MD;  Location: [facility];  Service: Cardiovascular;  Laterality: Bilateral;   PORTACATH PLACEMENT Left 05/24/2020   Procedure: INSERTION PORT-A-CATH;  Surgeon: Aviva Signs, MD;  Location: [facility];  Service: General;  Laterality: Left;   TEE WITHOUT CARDIOVERSION N/A 01/24/2016   Procedure: TRANSESOPHAGEAL ECHOCARDIOGRAM (TEE);  Surgeon: Gaye Pollack, MD;  Location: [facility];  Service: Open Heart Surgery;  Laterality: N/A;   TONSILLECTOMY     age 79   TUBAL LIGATION      SOCIAL HISTORY:  Social History   Socioeconomic History   Marital status: Married    Spouse name: Jessica Taylor   Number of  children: 4   Years of education: Not on file   Highest education level: Not on file  Occupational History   Occupation: retired    Comment: worked as Presenter, broadcasting for [employer]  Tobacco Use   Smoking status: Former    Packs/day: 1.50    Years: 42.00    Pack years: 63.00    Types: Cigarettes    Quit date: 05/19/1999    Years since quitting: 22.2   Smokeless tobacco: Never  Vaping Use   Vaping Use: Never used  Substance and Sexual Activity   Alcohol use: No   Drug use: No   Sexual activity: Yes    Birth control/protection: Surgical  Other Topics Concern   Not on file  Social History Narrative   Not on file   Social Determinants of Health    Financial Resource Strain: Medium Risk   Difficulty of Paying Living Expenses: Somewhat hard  Food Insecurity: No Food Insecurity   Worried About Charity fundraiser in the Last Year: Never true   [place] in the Last Year: Never true  Transportation Needs: No Transportation Needs   Lack of Transportation (Medical): No   Lack of Transportation (Non-Medical): No  Physical Activity: Inactive   Days of Exercise per Week: 0 days   Minutes of Exercise per Session: 0 min  Stress: No Stress Concern Present   Feeling of Stress : Not at all  Social Connections: Moderately Isolated   Frequency of Communication with Friends and Family: More than three times a week   Frequency of Social Gatherings with Friends and Family: Three times a week   Attends Religious Services: Never   Active Member of Clubs or Organizations: No   Attends Music therapist: Never   Marital Status: Married  Human resources officer Violence: Not At Risk   Fear of Current or Ex-Partner: No   Emotionally Abused: No   Physically Abused: No   Sexually Abused: No    FAMILY HISTORY:  Family History  Problem Relation Age of Onset   Ovarian cancer Mother 56   Cancer Father 32       unsure of which kind, "it was in his glands"   Hypertension Maternal Grandmother    Stroke Maternal Grandfather    Hypertension Son    Brain cancer Sister    Hypertension Daughter    Heart attack Neg Hx    Colon cancer Neg Hx    Esophageal cancer Neg Hx    Inflammatory bowel disease Neg Hx    Liver disease Neg Hx    Pancreatic cancer Neg Hx    Rectal cancer Neg Hx    Stomach cancer Neg Hx     CURRENT MEDICATIONS:  Current Outpatient Medications  Medication Sig Dispense Refill   anastrozole (ARIMIDEX) 1 MG tablet TAKE 1 TABLET BY MOUTH ONCE DAILY. 30 tablet 6   aspirin EC 81 MG tablet Take 81 mg by mouth every evening.      atorvastatin (LIPITOR) 40 MG tablet Take 1 tablet (40 mg total) by mouth daily. Pt needs to  make appt with provider for further refills. 1st attempt 90 tablet 1   clopidogrel (PLAVIX) 75 MG tablet TAKE (1) TABLET BY MOUTH ONCE DAILY. 90 tablet 3   ergocalciferol (VITAMIN D2) 1.25 MG (50000 UT) capsule Take 1 capsule (50,000 Units total) by mouth once a week. 4 capsule 4   esomeprazole (NEXIUM) 20 MG capsule Take 20 mg by mouth daily.  HYDROcodone-acetaminophen (NORCO/VICODIN) 5-325 MG tablet      hydrOXYzine (ATARAX/VISTARIL) 25 MG tablet Take 1 tablet (25 mg total) by mouth at bedtime as needed. 30 tablet 3   levothyroxine (SYNTHROID, LEVOTHROID) 50 MCG tablet Take 50 mcg by mouth daily before breakfast.     lidocaine-prilocaine (EMLA) cream Apply to affected area once 30 g 3   metoprolol tartrate (LOPRESSOR) 25 MG tablet Take 1 tablet (25 mg total) by mouth 2 (two) times daily. 180 tablet 3   nitroGLYCERIN (NITROSTAT) 0.4 MG SL tablet Place 1 tablet (0.4 mg total) under the tongue every 5 (five) minutes as needed for chest pain. 25 tablet prn   olmesartan (BENICAR) 5 MG tablet 1 tablet     Pembrolizumab (KEYTRUDA IV) Inject 200 mg into the vein every 21 ( twenty-one) days.      pembrolizumab (KEYTRUDA) 100 MG/4ML SOLN See admin instructions.     prochlorperazine (COMPAZINE) 10 MG tablet Take 1 tablet (10 mg total) by mouth every 6 (six) hours as needed (Nausea or vomiting). 30 tablet 1   No current facility-administered medications for this visit.    ALLERGIES:  Allergies  Allergen Reactions   Crab [Shellfish Allergy] Nausea And Vomiting    Throws up violently   Ezetimibe Diarrhea   Other Nausea And Vomiting    Shrimp    PHYSICAL EXAM:  Performance status (ECOG): 1 - Symptomatic but completely ambulatory  There were no vitals filed for this visit. Wt Readings from Last 3 Encounters:  07/21/21 161 lb 9.6 oz (73.3 kg)  06/30/21 162 lb (73.5 kg)  06/09/21 159 lb 8 oz (72.3 kg)   Physical Exam Vitals reviewed.  Constitutional:      Appearance: Normal appearance.   Cardiovascular:     Rate and Rhythm: Normal rate and regular rhythm.     Pulses: Normal pulses.     Heart sounds: Normal heart sounds.  Pulmonary:     Effort: Pulmonary effort is normal.     Breath sounds: Normal breath sounds.  Chest:  Breasts:    Left: Tenderness present. No mass, nipple discharge or skin change.  Neurological:     General: No focal deficit present.     Mental Status: She is alert and oriented to person, place, and time.  Psychiatric:        Mood and Affect: Mood normal.        Behavior: Behavior normal.    LABORATORY DATA:  I have reviewed the labs as listed.  CBC Latest Ref Rng & Units 07/21/2021 06/30/2021 06/09/2021  WBC 4.0 - 10.5 K/uL 5.5 5.6 5.5  Hemoglobin 12.0 - 15.0 g/dL 13.8 13.3 13.0  Hematocrit 36.0 - 46.0 % 44.0 42.6 41.4  Platelets 150 - 400 K/uL 170 169 164   CMP Latest Ref Rng & Units 07/21/2021 06/30/2021 06/09/2021  Glucose 70 - 99 mg/dL 107(H) 102(H) 116(H)  BUN 8 - 23 mg/dL 21 21 27(H)  Creatinine 0.44 - 1.00 mg/dL 1.45(H) 1.70(H) 1.73(H)  Sodium 135 - 145 mmol/L 139 138 140  Potassium 3.5 - 5.1 mmol/L 4.3 3.8 4.1  Chloride 98 - 111 mmol/L 108 108 107  CO2 22 - 32 mmol/L _0 Calcium 8.9 - 10.3 mg/dL 9.3 9.4 9.1  Total Protein 6.5 - 8.1 g/dL 7.1 6.5 6.2(L)  Total Bilirubin 0.3 - 1.2 mg/dL 0.8 0.8 0.5  Alkaline Phos 38 - 126 U/L 88 82 79  AST 15 - 41 U/L _1 ALT 0 -  44 U/L _0 DIAGNOSTIC IMAGING:  I have independently reviewed the scans and discussed with the patient. CT CHEST ABDOMEN PELVIS WO CONTRAST  Result Date: 08/09/2021 CLINICAL DATA:  Colon cancer and  breast cancer.  Surveillance. EXAM: CT CHEST, ABDOMEN AND PELVIS WITHOUT CONTRAST TECHNIQUE: Multidetector CT imaging of the chest, abdomen and pelvis was performed following the standard protocol without IV contrast. COMPARISON:  PET-CT scan 12/23/2020,  CT 04/19/2019 FINDINGS: CT CHEST FINDINGS Cardiovascular: Coronary artery calcification and aortic  atherosclerotic calcification. Post CABG Mediastinum/Nodes: No axillary or supraclavicular adenopathy. No mediastinal or hilar adenopathy. No pericardial fluid. Esophagus normal. Lungs/Pleura: No suspicious pulmonary nodularity. Subpleural ground-glass densities in lower lobe similar prior. Musculoskeletal: No aggressive osseous lesion. CT ABDOMEN AND PELVIS FINDINGS Hepatobiliary: No focal hepatic lesion. No biliary ductal dilatation. Gallbladder is normal. Common bile duct is normal. Pancreas: Pancreas is normal. No ductal dilatation. No pancreatic inflammation. Spleen: Normal spleen Adrenals/urinary tract: Adrenal glands and kidneys are normal. The ureters and bladder normal. Stomach/Bowel: Stomach and small bowel normal. Partial RIGHT hemicolectomy anatomy. LEFT colon normal. Diverticula sigmoid colon without acute inflammation. Rectum normal. Vascular/Lymphatic: Abdominal aorta is normal caliber with atherosclerotic calcification. There is no retroperitoneal or periportal lymphadenopathy. No pelvic lymphadenopathy. Reproductive: Uterus and adnexa unremarkable. Other: No free fluid. Musculoskeletal: No aggressive osseous lesion. IMPRESSION: No evidence lung cancer recurrence or breast cancer recurrence on chest, abdomen, and pelvis CT scan. Sigmoid diverticulosis without diverticulitis Electronically Signed   By: Suzy Bouchard M.D.   On: 08/09/2021 20:02     ASSESSMENT:  1.  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  Right hemicolectomy on 10/11/2019, grade 3 adenocarcinoma, negative margins, positive LVSI, 2 tumor deposits, 0/15 lymph nodes positive, PT3PN1C. 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  MLH1 hyper methylation present. -PET scan in December 2020 showed lymph node in the right axillary region.  Biopsy consistent with metastatic colon cancer. -Last CEA was 10.7 on 11/22/2019. -Right  axillary lymph node excision on 04/02/2020 consistent with metastatic carcinoma from colon cancer. -Pembrolizumab started on 05/14/2020. -PET scan on 08/05/2020 shows complete resolution of lymphadenopathy.  No evidence of new areas of uptake.  Mild vague residual hypermetabolism in the right axilla without soft tissue mass. -PET scan on 12/23/2020 with no evidence of recurrence.   2.  Stage I (PT1CPN0) right Breast, Grade 2 Invasive Lobular Carcinoma: -MRI of the breast on 12/25/2019 showed 1 cm mass behind the right areola with a suspicious mass in the left breast. -Right breast lumpectomy on 04/02/2020 shows invasive lobular carcinoma, grade 2, 1.2 cm.  Resection margins are negative.  Negative LVSI.  2 sentinel lymph nodes were negative for carcinoma.  ER/PR 100% positive, HER-2 negative, Ki-67 10%.   PLAN:  1.  Stage IV colon cancer to the right axillary lymph node: - Reviewed CT CAP from 08/08/2021 which did not show any evidence of recurrence.  No significant adenopathy or other lesions. - She reported left breast tenderness in the lower quadrant for the past 1 to 2 weeks.  Physical examination did not reveal any palpable masses although tenderness in the lower quadrant.  She was told to avoid wearing tight wired bra. - She had diarrhea x3 on Sunday which improved with Imodium.  No other episodes of diarrhea reported. - Rest of the labs showed normal LFTs and CBC.  TSH was 0.64.  CEA has increased to 9.7  on 07/21/2021.  We will hold her treatment today.  Reevaluate her in 2 weeks.   2.  Right breast invasive lobular carcinoma, grade 2: - Continue anastrozole daily.  No major side effects.   3.  Osteoporosis: - Prior DEXA scan showed osteoporosis. - Continue vitamin D 50,000 units daily.   4.  Bladder cancer: - Continue follow-up with urology at [facility].   5.  Elevated creatinine: - Her creatinine increased to 2.23 today.  Prior to that it was 1.45. - We will give her 1 L normal saline  over 2 hours.  Recommend holding Benicar. - We will start her on Norvasc 5 mg daily for blood pressure.  Recommend increased fluid intake to 2 L/day.  She is drinking about 1 quart a day.   6.  Generalized pruritus: - Continue moisturizing lotion and continue Atarax as needed.   Orders placed this encounter:  No orders of the defined types were placed in this encounter.    Derek Jack, MD [place] [phone]   I, Thana Ates, am acting as a scribe for Dr. Derek Jack.  I, Derek Jack MD, have reviewed the above documentation for accuracy and completeness, and I agree with the above.

## 2021-08-12 ENCOUNTER — Inpatient Hospital Stay (HOSPITAL_COMMUNITY): Payer: Medicare Other | Attending: Hematology

## 2021-08-12 ENCOUNTER — Other Ambulatory Visit: Payer: Self-pay

## 2021-08-12 ENCOUNTER — Inpatient Hospital Stay (HOSPITAL_COMMUNITY): Payer: Medicare Other

## 2021-08-12 ENCOUNTER — Encounter (HOSPITAL_COMMUNITY): Payer: Self-pay | Admitting: Hematology

## 2021-08-12 ENCOUNTER — Inpatient Hospital Stay (HOSPITAL_BASED_OUTPATIENT_CLINIC_OR_DEPARTMENT_OTHER): Payer: Medicare Other | Admitting: Hematology

## 2021-08-12 VITALS — BP 139/59 | HR 66 | Temp 96.8°F | Resp 18 | Wt 158.8 lb

## 2021-08-12 DIAGNOSIS — C773 Secondary and unspecified malignant neoplasm of axilla and upper limb lymph nodes: Secondary | ICD-10-CM | POA: Diagnosis not present

## 2021-08-12 DIAGNOSIS — Z808 Family history of malignant neoplasm of other organs or systems: Secondary | ICD-10-CM | POA: Insufficient documentation

## 2021-08-12 DIAGNOSIS — Z87891 Personal history of nicotine dependence: Secondary | ICD-10-CM | POA: Insufficient documentation

## 2021-08-12 DIAGNOSIS — Z8041 Family history of malignant neoplasm of ovary: Secondary | ICD-10-CM | POA: Insufficient documentation

## 2021-08-12 DIAGNOSIS — L299 Pruritus, unspecified: Secondary | ICD-10-CM | POA: Insufficient documentation

## 2021-08-12 DIAGNOSIS — C182 Malignant neoplasm of ascending colon: Secondary | ICD-10-CM | POA: Insufficient documentation

## 2021-08-12 DIAGNOSIS — M810 Age-related osteoporosis without current pathological fracture: Secondary | ICD-10-CM | POA: Diagnosis not present

## 2021-08-12 DIAGNOSIS — C50911 Malignant neoplasm of unspecified site of right female breast: Secondary | ICD-10-CM

## 2021-08-12 DIAGNOSIS — C679 Malignant neoplasm of bladder, unspecified: Secondary | ICD-10-CM | POA: Insufficient documentation

## 2021-08-12 DIAGNOSIS — R7989 Other specified abnormal findings of blood chemistry: Secondary | ICD-10-CM | POA: Diagnosis not present

## 2021-08-12 DIAGNOSIS — I10 Essential (primary) hypertension: Secondary | ICD-10-CM | POA: Diagnosis not present

## 2021-08-12 LAB — COMPREHENSIVE METABOLIC PANEL
ALT: 14 U/L (ref 0–44)
AST: 18 U/L (ref 15–41)
Albumin: 3.9 g/dL (ref 3.5–5.0)
Alkaline Phosphatase: 89 U/L (ref 38–126)
Anion gap: 6 (ref 5–15)
BUN: 25 mg/dL — ABNORMAL HIGH (ref 8–23)
CO2: 26 mmol/L (ref 22–32)
Calcium: 9.5 mg/dL (ref 8.9–10.3)
Chloride: 107 mmol/L (ref 98–111)
Creatinine, Ser: 2.23 mg/dL — ABNORMAL HIGH (ref 0.44–1.00)
GFR, Estimated: 21 mL/min — ABNORMAL LOW (ref >60)
Glucose, Bld: 107 mg/dL — ABNORMAL HIGH (ref 70–99)
Potassium: 4 mmol/L (ref 3.5–5.1)
Sodium: 139 mmol/L (ref 135–145)
Total Bilirubin: 1 mg/dL (ref 0.3–1.2)
Total Protein: 6.7 g/dL (ref 6.5–8.1)

## 2021-08-12 LAB — CBC WITH DIFFERENTIAL/PLATELET
Abs Immature Granulocytes: 0.02 10*3/uL (ref 0.00–0.07)
Basophils Absolute: 0.1 10*3/uL (ref 0.0–0.1)
Basophils Relative: 1 %
Eosinophils Absolute: 0.2 10*3/uL (ref 0.0–0.5)
Eosinophils Relative: 3 %
HCT: 44.3 % (ref 36.0–46.0)
Hemoglobin: 13.8 g/dL (ref 12.0–15.0)
Immature Granulocytes: 0 %
Lymphocytes Relative: 18 %
Lymphs Abs: 1 10*3/uL (ref 0.7–4.0)
MCH: 28.5 pg (ref 26.0–34.0)
MCHC: 31.2 g/dL (ref 30.0–36.0)
MCV: 91.3 fL (ref 80.0–100.0)
Monocytes Absolute: 0.4 10*3/uL (ref 0.1–1.0)
Monocytes Relative: 8 %
Neutro Abs: 3.9 10*3/uL (ref 1.7–7.7)
Neutrophils Relative %: 70 %
Platelets: 172 10*3/uL (ref 150–400)
RBC: 4.85 MIL/uL (ref 3.87–5.11)
RDW: 13.9 % (ref 11.5–15.5)
WBC: 5.6 10*3/uL (ref 4.0–10.5)
nRBC: 0 % (ref 0.0–0.2)

## 2021-08-12 MED ORDER — HEPARIN SOD (PORK) LOCK FLUSH 100 UNIT/ML IV SOLN
500.0000 [IU] | Freq: Once | INTRAVENOUS | Status: AC
  Administered 2021-08-12: 500 [IU] via INTRAVENOUS

## 2021-08-12 MED ORDER — AMLODIPINE BESYLATE 5 MG PO TABS: 5.0000 mg | ORAL_TABLET | Freq: Every day | ORAL | 3 refills | Status: DC

## 2021-08-12 MED ORDER — SODIUM CHLORIDE 0.9 % IV SOLN: Freq: Once | INTRAVENOUS | Status: AC

## 2021-08-12 MED ORDER — SODIUM CHLORIDE 0.9% FLUSH
10.0000 mL | Freq: Once | INTRAVENOUS | Status: AC
  Administered 2021-08-12: 10 mL via INTRAVENOUS

## 2021-08-12 NOTE — Progress Notes (Signed)
Patient has been assessed, vital signs and labs have been reviewed by Dr. Delton Coombes. ANC, Creatinine, LFTs, and Platelets are within treatment parameters per Dr. Delton Coombes. The patient is not to receive treatment today due to Cr 2.2.  Will receive 1 liter of 0.9 NS over 2 hours.  Primary RN and pharmacy aware.

## 2021-08-12 NOTE — Patient Instructions (Addendum)
[facility] at [facility] Discharge Instructions  You were seen today by Dr. Delton Coombes. He went over your recent results and scans. Stay hydrated by drinking plenty of fluids (at least 2 quarts a day). Dr. Delton Coombes will see you back in 2 weeks for labs and follow up.   Thank you for choosing [place] at [facility] to provide your oncology and hematology care.  To afford each patient quality time with our provider, please arrive at least 15 minutes before your scheduled appointment time.   If you have a lab appointment with the [facility] please come in thru the Main Entrance and check in at the main information desk  You need to re-schedule your appointment should you arrive 10 or more minutes late.  We strive to give you quality time with our providers, and arriving late affects you and other patients whose appointments are after yours.  Also, if you no show three or more times for appointments you may be dismissed from the clinic at the providers discretion.     Again, thank you for choosing [facility].  Our hope is that these requests will decrease the amount of time that you wait before being seen by our physicians.       _____________________________________________________________  Should you have questions after your visit to [facility], please contact our office at [phone] between the hours of 8:00 a.m. and 4:30 p.m.  Voicemails left after 4:00 p.m. will not be returned until the following business day.  For prescription refill requests, have your pharmacy contact our office and allow 72 hours.    Cancer Center Support Programs:   > Cancer Support Group  2nd Tuesday of the month 1pm-2pm, Journey Room

## 2021-08-12 NOTE — Patient Instructions (Signed)
[facility]  Discharge Instructions: Thank you for choosing [facility] to provide your oncology and hematology care.  If you have a lab appointment with the Cancer Center, please come in thru the Main Entrance and check in at the main information desk.  Wear comfortable clothing and clothing appropriate for easy access to any Portacath or PICC line.   We strive to give you quality time with your provider. You may need to reschedule your appointment if you arrive late (15 or more minutes).  Arriving late affects you and other patients whose appointments are after yours.  Also, if you miss three or more appointments without notifying the office, you may be dismissed from the clinic at the provider's discretion.      For prescription refill requests, have your pharmacy contact our office and allow 72 hours for refills to be completed.        To help prevent nausea and vomiting after your treatment, we encourage you to take your nausea medication as directed.  BELOW ARE SYMPTOMS THAT SHOULD BE REPORTED IMMEDIATELY: *FEVER GREATER THAN 100.4 F (38 C) OR HIGHER *CHILLS OR SWEATING *NAUSEA AND VOMITING THAT IS NOT CONTROLLED WITH YOUR NAUSEA MEDICATION *UNUSUAL SHORTNESS OF BREATH *UNUSUAL BRUISING OR BLEEDING *URINARY PROBLEMS (pain or burning when urinating, or frequent urination) *BOWEL PROBLEMS (unusual diarrhea, constipation, pain near the anus) TENDERNESS IN MOUTH AND THROAT WITH OR WITHOUT PRESENCE OF ULCERS (sore throat, sores in mouth, or a toothache) UNUSUAL RASH, SWELLING OR PAIN  UNUSUAL VAGINAL DISCHARGE OR ITCHING   Items with * indicate a potential emergency and should be followed up as soon as possible or go to the Emergency Department if any problems should occur.  Please show the CHEMOTHERAPY ALERT CARD or IMMUNOTHERAPY ALERT CARD at check-in to the Emergency Department and triage nurse.  Should you have questions after your visit or need to cancel  or reschedule your appointment, please contact [facility] [phone]  and follow the prompts.  Office hours are 8:00 a.m. to 4:30 p.m. Monday - Friday. Please note that voicemails left after 4:00 p.m. may not be returned until the following business day.  We are closed weekends and major holidays. You have access to a nurse at all times for urgent questions. Please call the main number to the clinic [phone] and follow the prompts.  For any non-urgent questions, you may also contact your provider using MyChart. We now offer e-Visits for anyone 18 and older to request care online for non-urgent symptoms. For details visit [email or web address].   Also download the MyChart app! Go to the app store, search "MyChart", open the app, select [facility], and log in with your MyChart username and password.  Due to Covid, a mask is required upon entering the hospital/clinic. If you do not have a mask, one will be given to you upon arrival. For doctor visits, patients may have 1 support person aged 18 or older with them. For treatment visits, patients cannot have anyone with them due to current Covid guidelines and our immunocompromised population.  

## 2021-08-12 NOTE — Progress Notes (Signed)
Hydration fluids given per orders. Patient tolerated it well without problems. Vitals stable and discharged home from clinic ambulatory. Follow up as scheduled.  

## 2021-08-12 NOTE — Progress Notes (Signed)
Port flushed with good blood return noted. Patient awaiting labs for treatment.

## 2021-08-15 ENCOUNTER — Other Ambulatory Visit (HOSPITAL_COMMUNITY): Payer: Self-pay | Admitting: Hematology

## 2021-08-15 ENCOUNTER — Telehealth: Payer: Self-pay

## 2021-08-15 NOTE — Telephone Encounter (Signed)
Okay to schedule colonoscopy recall. Recall sent down to scheduling for pt to be scheduled.

## 2021-08-15 NOTE — Telephone Encounter (Signed)
-----   Message from Irving Copas., MD sent at 08/12/2021  4:56 PM EDT ----- Regarding: RE: [facility] Patient SK, Thanks for the update.  Lyndie Vanderloop,  I placed this patient's recall on your desk.  Let's go ahead and reach out to patient and get her on the books for Colonoscopy surveillance. Thanks.  GM ----- Message ----- From: Derek Jack, MD Sent: 08/12/2021   4:51 PM EDT To: Irving Copas., MD Subject: RE: [employer] Patient                             GM, I just saw her today for follow-up.  She is doing very well on immunotherapy with Keytruda.  Her latest CT scans did not show any evidence of disease.  She had mild elevation of creatinine for which we had to hold treatment today.  But I think she will be okay for follow-up colonoscopy. SK ----- Message ----- From: Irving Copas., MD Sent: 08/12/2021   3:34 PM EDT To: Derek Jack, MD Subject: [facility] Patient                                 Respiratory, Hope you are doing well. I have not seen Ms. Graf for quite a while. Was wondering if based on your recent evaluation of her you think she may still be a candidate to have her surveillance colonoscopy post colon resection. If you think she is still in good shape, I will plan to bring her in for her follow-up colonoscopy. Appreciate your thoughts and follow-up. Thanks. GM

## 2021-08-16 ENCOUNTER — Other Ambulatory Visit (HOSPITAL_COMMUNITY): Payer: Self-pay | Admitting: Hematology

## 2021-08-16 DIAGNOSIS — C50911 Malignant neoplasm of unspecified site of right female breast: Secondary | ICD-10-CM

## 2021-08-17 ENCOUNTER — Encounter (HOSPITAL_COMMUNITY): Payer: Self-pay | Admitting: Hematology

## 2021-08-25 NOTE — Progress Notes (Signed)
Jessica Taylor, Jessica Taylor   CLINIC:  Medical Oncology/Hematology  PCP:  Jessica Contras, MD [address] / [place] [address] [phone]   REASON FOR VISIT:  Follow-up for stage I right breast cancer and colon cancer  PRIOR THERAPY:  1. Robotic proximal colectomy on 10/11/2019. 2. Right breast lumpectomy and SLNB on 04/02/2020  NGS Results: MSI--high, Foundation 1 not sent  CURRENT THERAPY: Keytruda every 3 weeks  BRIEF ONCOLOGIC HISTORY:  Oncology History  Cancer of ascending colon s/p robotic proximal colectomy 10/11/2019  10/11/2019 Initial Diagnosis   Cancer of ascending colon s/p robotic proximal colectomy 10/11/2019   10/23/2019 Cancer Staging   Staging form: Colon and Rectum, AJCC 8th Edition - Clinical stage from 10/23/2019: Stage IVA (cT3, cN1c, cM1a) - Signed by Derek Jack, MD on 12/14/2019   05/14/2020 -  Chemotherapy   Patient is on Treatment Plan : COLORECTAL Pembrolizumab q21d     Malignant neoplasm of right female breast ([place])  04/23/2020 Initial Diagnosis   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 [facility])   04/23/2020 Cancer Staging   Staging form: Breast, AJCC 8th Edition - Clinical stage from 04/23/2020: Stage IA (cT1c, cN0(sn), cM0, G2, ER+, PR+, HER2-) - Signed by Derek Jack, MD on 04/23/2020     CANCER STAGING: Cancer Staging Cancer of ascending colon s/p robotic proximal colectomy 10/11/2019 Staging form: Colon and Rectum, AJCC 8th Edition - Clinical stage from 10/23/2019: Stage IVA (cT3, cN1c, cM1a) - Signed by Derek Jack, MD on 12/14/2019  Malignant neoplasm of right female breast [facility]) Staging form: Breast, AJCC 8th Edition - Clinical stage from 04/23/2020: Stage IA (cT1c, cN0(sn), cM0, G2, ER+, PR+, HER2-) - Signed by Derek Jack, MD on 04/23/2020   INTERVAL HISTORY:  Jessica Taylor, a 80 y.o. female, returns for routine follow-up  and consideration for next cycle of chemotherapy. Jessica Taylor was last seen on 08/12/2021.  Due for cycle #22 of Keytruda today.   Overall, she tells me she has been feeling pretty well. Her breast pain has resolved. She reports occasional mild watery diarrhea; when it occurs, it occurs 1-2 times daily. She denies nausea, vomiting, abdominal pain, itching, and dry skin. Her appetite is at her baseline.   Overall, she feels ready for next cycle of chemo today.   REVIEW OF SYSTEMS:  Review of Systems  Constitutional:  Negative for appetite change (80%) and fatigue (60%).  Cardiovascular:  Negative for chest pain.  Gastrointestinal:  Positive for diarrhea. Negative for abdominal pain, nausea and vomiting.  Skin:  Negative for itching.  Psychiatric/Behavioral:  The patient is nervous/anxious.   All other systems reviewed and are negative.  PAST MEDICAL/SURGICAL HISTORY:  Past Medical History:  Diagnosis Date   Abdominal aortic aneurysm (HCC)    Acid reflux    Anemia    Arthritis    knees , R shoulder - tx /w injection - 11/2014   Basal cell carcinoma (BCC) of dorsum of nose 2010   Resolved   Cancer ([place])    basal cell on nose   Colon cancer ([place])    Coronary artery disease    Hypercholesteremia    Hypertension    Hypothyroidism    Lt Acute pyelonephritis 09/11/2018   MI, old 2000   Peripheral arterial disease ([place])    hhigh-grade ostial bilateral calcified iliac stenosis with claudication   Tobacco abuse    Vertigo    when lays on  left side.   Past Surgical History:  Procedure Laterality Date   BREAST LUMPECTOMY WITH RADIOACTIVE SEED AND SENTINEL LYMPH NODE BIOPSY Bilateral 04/02/2020   Procedure: BILATERAL BREAST LUMPECTOMY WITH RADIOACTIVE SEED AND RIGHT SENTINEL LYMPH NODE BIOPSY AND RIGHT TARGETED AXILLARY LYMPH NODE BIOPSY;  Surgeon: Erroll Luna, MD;  Location: [facility];  Service: General;  Laterality: Bilateral;   CARDIAC CATHETERIZATION  5 stents    CARDIAC CATHETERIZATION N/A 01/20/2016   Procedure: Left Heart Cath and Coronary Angiography;  Surgeon: Peter M Martinique, MD;  Location: [facility];  Service: Cardiovascular;  Laterality: N/A;   CATARACT EXTRACTION W/PHACO Left 05/24/2014   Procedure: CATARACT EXTRACTION PHACO AND INTRAOCULAR LENS PLACEMENT (IOC);  Surgeon: Tonny Branch, MD;  Location: [facility];  Service: Ophthalmology;  Laterality: Left;  CDE:  9.30   CATARACT EXTRACTION W/PHACO Right 06/18/2014   Procedure: CATARACT EXTRACTION PHACO AND INTRAOCULAR LENS PLACEMENT RIGHT EYE CDE=10.84;  Surgeon: Tonny Branch, MD;  Location: [facility];  Service: Ophthalmology;  Laterality: Right;   CORONARY ARTERY BYPASS GRAFT N/A 01/24/2016   Procedure: CORONARY ARTERY BYPASS GRAFTING (CABG);  Surgeon: Gaye Pollack, MD;  Location: [facility];  Service: Open Heart Surgery;  Laterality: N/A;   CORONARY STENT PLACEMENT     CORONARY STENT PLACEMENT  03/06/15   CFX DES   CYSTOSCOPY/URETEROSCOPY/HOLMIUM LASER/STENT PLACEMENT Left 09/12/2018   Procedure: CYSTOSCOPY/URETEROSCOPY/STENT PLACEMENT;  Surgeon: Ceasar Mons, MD;  Location: [facility];  Service: Urology;  Laterality: Left;   EYE SURGERY     LEFT HEART CATH AND CORS/GRAFTS ANGIOGRAPHY N/A 09/12/2019   Procedure: LEFT HEART CATH AND CORS/GRAFTS ANGIOGRAPHY;  Surgeon: Troy Sine, MD;  Location: [facility];  Service: Cardiovascular;  Laterality: N/A;   LEFT HEART CATHETERIZATION WITH CORONARY ANGIOGRAM N/A 03/06/2015   Procedure: LEFT HEART CATHETERIZATION WITH CORONARY ANGIOGRAM;  Surgeon: Lorretta Harp, MD;  Location: [facility] CATH LAB;  Service: Cardiovascular;  Laterality: N/A;   PARTIAL KNEE ARTHROPLASTY Right 02/04/2015   Procedure: UNICOMPARTMENTAL KNEE;  Surgeon: Dorna Leitz, MD;  Location: [facility];  Service: Orthopedics;  Laterality: Right;   PERIPHERAL VASCULAR CATHETERIZATION Bilateral 05/13/2015   Procedure: Lower Extremity Angiography;  Surgeon: Lorretta Harp, MD;  Location: [facility];  Service: Cardiovascular;  Laterality: Bilateral;   PERIPHERAL VASCULAR CATHETERIZATION N/A 05/13/2015   Procedure: Abdominal Aortogram;  Surgeon: Lorretta Harp, MD;  Location: [facility];  Service: Cardiovascular;  Laterality: N/A;   PERIPHERAL VASCULAR CATHETERIZATION Bilateral 06/27/2015   Procedure: Peripheral Vascular Intervention;  Surgeon: Lorretta Harp, MD;  Location: [facility];  Service: Cardiovascular;  Laterality: Bilateral;  ILIACS   PERIPHERAL VASCULAR CATHETERIZATION Bilateral 06/27/2015   Procedure: Peripheral Vascular Atherectomy;  Surgeon: Lorretta Harp, MD;  Location: [facility];  Service: Cardiovascular;  Laterality: Bilateral;   PORTACATH PLACEMENT Left 05/24/2020   Procedure: INSERTION PORT-A-CATH;  Surgeon: Aviva Signs, MD;  Location: [facility];  Service: General;  Laterality: Left;   TEE WITHOUT CARDIOVERSION N/A 01/24/2016   Procedure: TRANSESOPHAGEAL ECHOCARDIOGRAM (TEE);  Surgeon: Gaye Pollack, MD;  Location: [facility];  Service: Open Heart Surgery;  Laterality: N/A;   TONSILLECTOMY     age 4   TUBAL LIGATION      SOCIAL HISTORY:  Social History   Socioeconomic History   Marital status: Married    Spouse name: Tommy   Number of children: 4   Years of education: Not on file  Highest education level: Not on file  Occupational History   Occupation: retired    Comment: worked as Presenter, broadcasting for [employer]  Tobacco Use   Smoking status: Former    Packs/day: 1.50    Years: 42.00    Pack years: 63.00    Types: Cigarettes    Quit date: 05/19/1999    Years since quitting: 22.2   Smokeless tobacco: Never  Vaping Use   Vaping Use: Never used  Substance and Sexual Activity   Alcohol use: No   Drug use: No   Sexual activity: Yes    Birth control/protection: Surgical  Other Topics Concern   Not on file  Social History Narrative   Not on file   Social Determinants of Health   Financial Resource Strain:  Medium Risk   Difficulty of Paying Living Expenses: Somewhat hard  Food Insecurity: No Food Insecurity   Worried About Charity fundraiser in the Last Year: Never true   Ran Out of Food in the Last Year: Never true  Transportation Needs: No Transportation Needs   Lack of Transportation (Medical): No   Lack of Transportation (Non-Medical): No  Physical Activity: Inactive   Days of Exercise per Week: 0 days   Minutes of Exercise per Session: 0 min  Stress: No Stress Concern Present   Feeling of Stress : Not at all  Social Connections: Moderately Isolated   Frequency of Communication with Friends and Family: More than three times a week   Frequency of Social Gatherings with Friends and Family: Three times a week   Attends Religious Services: Never   Active Member of Clubs or Organizations: No   Attends Archivist Meetings: Never   Marital Status: Married  Human resources officer Violence: Not At Risk   Fear of Current or Ex-Partner: No   Emotionally Abused: No   Physically Abused: No   Sexually Abused: No    FAMILY HISTORY:  Family History  Problem Relation Age of Onset   Ovarian cancer Mother 15   Cancer Father 87       unsure of which kind, "it was in his glands"   Hypertension Maternal Grandmother    Stroke Maternal Grandfather    Hypertension Son    Brain cancer Sister    Hypertension Daughter    Heart attack Neg Hx    Colon cancer Neg Hx    Esophageal cancer Neg Hx    Inflammatory bowel disease Neg Hx    Liver disease Neg Hx    Pancreatic cancer Neg Hx    Rectal cancer Neg Hx    Stomach cancer Neg Hx     CURRENT MEDICATIONS:  Current Outpatient Medications  Medication Sig Dispense Refill   amLODipine (NORVASC) 5 MG tablet Take 1 tablet (5 mg total) by mouth daily. 30 tablet 3   anastrozole (ARIMIDEX) 1 MG tablet TAKE 1 TABLET BY MOUTH ONCE DAILY. 30 tablet 6   aspirin EC 81 MG tablet Take 81 mg by mouth every evening.      atorvastatin (LIPITOR) 40 MG tablet  Take 1 tablet (40 mg total) by mouth daily. Pt needs to make appt with provider for further refills. 1st attempt 90 tablet 1   clopidogrel (PLAVIX) 75 MG tablet TAKE (1) TABLET BY MOUTH ONCE DAILY. 90 tablet 3   ergocalciferol (VITAMIN D2) 1.25 MG (50000 UT) capsule Take 1 capsule (50,000 Units total) by mouth once a week. 4 capsule 4   esomeprazole (NEXIUM) 20 MG capsule  Take 20 mg by mouth daily.      HYDROcodone-acetaminophen (NORCO/VICODIN) 5-325 MG tablet      hydrOXYzine (ATARAX/VISTARIL) 25 MG tablet TAKE (1) TABLET BY MOUTH AT BEDTIME AS NEEDED 30 tablet 6   levothyroxine (SYNTHROID, LEVOTHROID) 50 MCG tablet Take 50 mcg by mouth daily before breakfast.     lidocaine-prilocaine (EMLA) cream Apply to affected area once 30 g 3   metoprolol tartrate (LOPRESSOR) 25 MG tablet Take 1 tablet (25 mg total) by mouth 2 (two) times daily. 180 tablet 3   nitroGLYCERIN (NITROSTAT) 0.4 MG SL tablet Place 1 tablet (0.4 mg total) under the tongue every 5 (five) minutes as needed for chest pain. 25 tablet prn   Pembrolizumab (KEYTRUDA IV) Inject 200 mg into the vein every 21 ( twenty-one) days.      pembrolizumab (KEYTRUDA) 100 MG/4ML SOLN See admin instructions.     prochlorperazine (COMPAZINE) 10 MG tablet Take 1 tablet (10 mg total) by mouth every 6 (six) hours as needed (Nausea or vomiting). 30 tablet 1   No current facility-administered medications for this visit.    ALLERGIES:  Allergies  Allergen Reactions   Crab [Shellfish Allergy] Nausea And Vomiting    Throws up violently   Ezetimibe Diarrhea   Other Nausea And Vomiting    Shrimp    PHYSICAL EXAM:  Performance status (ECOG): 1 - Symptomatic but completely ambulatory  There were no vitals filed for this visit. Wt Readings from Last 3 Encounters:  08/12/21 158 lb 12.8 oz (72 kg)  07/21/21 161 lb 9.6 oz (73.3 kg)  06/30/21 162 lb (73.5 kg)   Physical Exam Vitals reviewed.  Constitutional:      Appearance: Normal appearance.   Cardiovascular:     Rate and Rhythm: Normal rate and regular rhythm.     Pulses: Normal pulses.     Heart sounds: Normal heart sounds.  Pulmonary:     Effort: Pulmonary effort is normal.     Breath sounds: Normal breath sounds.  Neurological:     General: No focal deficit present.     Mental Status: She is alert and oriented to person, place, and time.  Psychiatric:        Mood and Affect: Mood normal.        Behavior: Behavior normal.    LABORATORY DATA:  I have reviewed the labs as listed.  CBC Latest Ref Rng & Units 08/12/2021 07/21/2021 06/30/2021  WBC 4.0 - 10.5 K/uL 5.6 5.5 5.6  Hemoglobin 12.0 - 15.0 g/dL 13.8 13.8 13.3  Hematocrit 36.0 - 46.0 % 44.3 44.0 42.6  Platelets 150 - 400 K/uL 172 170 169   CMP Latest Ref Rng & Units 08/12/2021 07/21/2021 06/30/2021  Glucose 70 - 99 mg/dL 107(H) 107(H) 102(H)  BUN 8 - 23 mg/dL 25(H) 21 21  Creatinine 0.44 - 1.00 mg/dL 2.23(H) 1.45(H) 1.70(H)  Sodium 135 - 145 mmol/L 139 139 138  Potassium 3.5 - 5.1 mmol/L 4.0 4.3 3.8  Chloride 98 - 111 mmol/L 107 108 108  CO2 22 - 32 mmol/L 26 25 24   Calcium 8.9 - 10.3 mg/dL 9.5 9.3 9.4  Total Protein 6.5 - 8.1 g/dL 6.7 7.1 6.5  Total Bilirubin 0.3 - 1.2 mg/dL 1.0 0.8 0.8  Alkaline Phos 38 - 126 U/L 89 88 82  AST 15 - 41 U/L 18 20 19   ALT 0 - 44 U/L 14 14 17     DIAGNOSTIC IMAGING:  I have independently reviewed the scans and discussed with  the patient. CT CHEST ABDOMEN PELVIS WO CONTRAST  Result Date: 08/09/2021 CLINICAL DATA:  Colon cancer and  breast cancer.  Surveillance. EXAM: CT CHEST, ABDOMEN AND PELVIS WITHOUT CONTRAST TECHNIQUE: Multidetector CT imaging of the chest, abdomen and pelvis was performed following the standard protocol without IV contrast. COMPARISON:  PET-CT scan 12/23/2020,  CT 04/19/2019 FINDINGS: CT CHEST FINDINGS Cardiovascular: Coronary artery calcification and aortic atherosclerotic calcification. Post CABG Mediastinum/Nodes: No axillary or supraclavicular adenopathy. No  mediastinal or hilar adenopathy. No pericardial fluid. Esophagus normal. Lungs/Pleura: No suspicious pulmonary nodularity. Subpleural ground-glass densities in lower lobe similar prior. Musculoskeletal: No aggressive osseous lesion. CT ABDOMEN AND PELVIS FINDINGS Hepatobiliary: No focal hepatic lesion. No biliary ductal dilatation. Gallbladder is normal. Common bile duct is normal. Pancreas: Pancreas is normal. No ductal dilatation. No pancreatic inflammation. Spleen: Normal spleen Adrenals/urinary tract: Adrenal glands and kidneys are normal. The ureters and bladder normal. Stomach/Bowel: Stomach and small bowel normal. Partial RIGHT hemicolectomy anatomy. LEFT colon normal. Diverticula sigmoid colon without acute inflammation. Rectum normal. Vascular/Lymphatic: Abdominal aorta is normal caliber with atherosclerotic calcification. There is no retroperitoneal or periportal lymphadenopathy. No pelvic lymphadenopathy. Reproductive: Uterus and adnexa unremarkable. Other: No free fluid. Musculoskeletal: No aggressive osseous lesion. IMPRESSION: No evidence lung cancer recurrence or breast cancer recurrence on chest, abdomen, and pelvis CT scan. Sigmoid diverticulosis without diverticulitis Electronically Signed   By: Suzy Bouchard M.D.   On: 08/09/2021 20:02     ASSESSMENT:  1.  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  Right hemicolectomy on 10/11/2019, grade 3 adenocarcinoma, negative margins, positive LVSI, 2 tumor deposits, 0/15 lymph nodes positive, PT3PN1C. 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  MLH1 hyper methylation present. -PET scan in December 2020 showed lymph node in the right axillary region.  Biopsy consistent with metastatic colon cancer. -Last CEA was 10.7 on 11/22/2019. -Right axillary lymph node excision on 04/02/2020 consistent with metastatic carcinoma from colon cancer. -Pembrolizumab  started on 05/14/2020. -PET scan on 08/05/2020 shows complete resolution of lymphadenopathy.  No evidence of new areas of uptake.  Mild vague residual hypermetabolism in the right axilla without soft tissue mass. -PET scan on 12/23/2020 with no evidence of recurrence.   2.  Stage I (PT1CPN0) right Breast, Grade 2 Invasive Lobular Carcinoma: -MRI of the breast on 12/25/2019 showed 1 cm mass behind the right areola with a suspicious mass in the left breast. -Right breast lumpectomy on 04/02/2020 shows invasive lobular carcinoma, grade 2, 1.2 cm.  Resection margins are negative.  Negative LVSI.  2 sentinel lymph nodes were negative for carcinoma.  ER/PR 100% positive, HER-2 negative, Ki-67 10%.   PLAN:  1.  Stage IV colon cancer to the right axillary lymph node: - CT CAP from 08/08/2021 did not show any evidence of recurrence or significant adenopathy. - Left breast pain she reported at last visit improved. - Denies any immunotherapy related side effects.  She does have occasional diarrhea which is self-contained. - Reviewed labs from today which showed normal LFTs and CBC.  Creatinine improved to 1.75.  Last TSH was 0.6.  Last CEA was 9.7. - Would recommend proceeding with Keytruda today.  RTC 3 weeks for follow-up.   2.  Right breast invasive lobular carcinoma, grade 2: - Continue anastrozole daily.  No major side effects.   3.  Osteoporosis: - Prior DEXA scan showed osteoporosis.  Continue vitamin D 50,000 units weekly.   4.  Bladder cancer: - Continue follow-up with urology at [facility].   5.  Elevated creatinine: - Her creatinine improved from 2.23-1.75 today. - Creatinine baseline between 1.4-1.7. - Continue to hold Benicar.  Continue Norvasc for blood pressure which is fairly well controlled. - She will receive final mL of normal saline today.  I have recommended increased water intake.  She is only drinking 1 quart per day.   6.  Generalized pruritus: - Continue moisturizing lotion  and Atarax as needed.   Orders placed this encounter:  No orders of the defined types were placed in this encounter.    Derek Jack, MD [place] [phone]   I, Thana Ates, am acting as a scribe for Dr. Derek Jack.  I, Derek Jack MD, have reviewed the above documentation for accuracy and completeness, and I agree with the above.

## 2021-08-26 ENCOUNTER — Inpatient Hospital Stay (HOSPITAL_COMMUNITY): Payer: Medicare Other

## 2021-08-26 ENCOUNTER — Inpatient Hospital Stay (HOSPITAL_COMMUNITY): Payer: Medicare Other | Attending: Hematology

## 2021-08-26 ENCOUNTER — Other Ambulatory Visit: Payer: Self-pay

## 2021-08-26 ENCOUNTER — Inpatient Hospital Stay (HOSPITAL_BASED_OUTPATIENT_CLINIC_OR_DEPARTMENT_OTHER): Payer: Medicare Other | Admitting: Hematology

## 2021-08-26 VITALS — BP 151/75 | HR 58 | Temp 97.1°F | Resp 18

## 2021-08-26 DIAGNOSIS — C50911 Malignant neoplasm of unspecified site of right female breast: Secondary | ICD-10-CM | POA: Diagnosis not present

## 2021-08-26 DIAGNOSIS — C182 Malignant neoplasm of ascending colon: Secondary | ICD-10-CM

## 2021-08-26 DIAGNOSIS — Z79899 Other long term (current) drug therapy: Secondary | ICD-10-CM | POA: Insufficient documentation

## 2021-08-26 DIAGNOSIS — Z5112 Encounter for antineoplastic immunotherapy: Secondary | ICD-10-CM | POA: Diagnosis not present

## 2021-08-26 LAB — CBC WITH DIFFERENTIAL/PLATELET
Abs Immature Granulocytes: 0.01 10*3/uL (ref 0.00–0.07)
Basophils Absolute: 0.1 10*3/uL (ref 0.0–0.1)
Basophils Relative: 1 %
Eosinophils Absolute: 0.2 10*3/uL (ref 0.0–0.5)
Eosinophils Relative: 3 %
HCT: 41.5 % (ref 36.0–46.0)
Hemoglobin: 13.2 g/dL (ref 12.0–15.0)
Immature Granulocytes: 0 %
Lymphocytes Relative: 22 %
Lymphs Abs: 1.3 10*3/uL (ref 0.7–4.0)
MCH: 29.3 pg (ref 26.0–34.0)
MCHC: 31.8 g/dL (ref 30.0–36.0)
MCV: 92 fL (ref 80.0–100.0)
Monocytes Absolute: 0.4 10*3/uL (ref 0.1–1.0)
Monocytes Relative: 7 %
Neutro Abs: 4 10*3/uL (ref 1.7–7.7)
Neutrophils Relative %: 67 %
Platelets: 176 10*3/uL (ref 150–400)
RBC: 4.51 MIL/uL (ref 3.87–5.11)
RDW: 14.2 % (ref 11.5–15.5)
WBC: 5.9 10*3/uL (ref 4.0–10.5)
nRBC: 0 % (ref 0.0–0.2)

## 2021-08-26 LAB — COMPREHENSIVE METABOLIC PANEL
ALT: 15 U/L (ref 0–44)
AST: 20 U/L (ref 15–41)
Albumin: 3.8 g/dL (ref 3.5–5.0)
Alkaline Phosphatase: 86 U/L (ref 38–126)
Anion gap: 7 (ref 5–15)
BUN: 20 mg/dL (ref 8–23)
CO2: 27 mmol/L (ref 22–32)
Calcium: 9.3 mg/dL (ref 8.9–10.3)
Chloride: 105 mmol/L (ref 98–111)
Creatinine, Ser: 1.75 mg/dL — ABNORMAL HIGH (ref 0.44–1.00)
GFR, Estimated: 29 mL/min — ABNORMAL LOW (ref >60)
Glucose, Bld: 119 mg/dL — ABNORMAL HIGH (ref 70–99)
Potassium: 3.7 mmol/L (ref 3.5–5.1)
Sodium: 139 mmol/L (ref 135–145)
Total Bilirubin: 0.8 mg/dL (ref 0.3–1.2)
Total Protein: 6.5 g/dL (ref 6.5–8.1)

## 2021-08-26 MED ORDER — SODIUM CHLORIDE 0.9 % IV SOLN: INTRAVENOUS | Status: DC

## 2021-08-26 MED ORDER — HEPARIN SOD (PORK) LOCK FLUSH 100 UNIT/ML IV SOLN
500.0000 [IU] | Freq: Once | INTRAVENOUS | Status: AC | PRN
  Administered 2021-08-26: 500 [IU]

## 2021-08-26 MED ORDER — SODIUM CHLORIDE 0.9 % IV SOLN
200.0000 mg | Freq: Once | INTRAVENOUS | Status: AC
  Administered 2021-08-26: 200 mg via INTRAVENOUS
  Filled 2021-08-26: qty 8

## 2021-08-26 MED ORDER — SODIUM CHLORIDE 0.9% FLUSH
10.0000 mL | INTRAVENOUS | Status: DC | PRN
  Administered 2021-08-26: 10 mL

## 2021-08-26 MED ORDER — SODIUM CHLORIDE 0.9 % IV SOLN: Freq: Once | INTRAVENOUS | Status: AC

## 2021-08-26 NOTE — Progress Notes (Signed)
Patients port flushed without difficulty.  Good blood return noted with no bruising or swelling noted at site.  Stable during access and blood draw.  Patient to remain accessed for treatment. 

## 2021-08-26 NOTE — Patient Instructions (Addendum)
[facility] at [facility] Discharge Instructions  You were seen today by Dr. Katragadda. He went over your recent results, and you received your treatment. Dr. Katragadda will see you back in 3 weeks for labs and follow up.   Thank you for choosing [facility] at [facility] to provide your oncology and hematology care.  To afford each patient quality time with our provider, please arrive at least 15 minutes before your scheduled appointment time.   If you have a lab appointment with the Cancer Center please come in thru the Main Entrance and check in at the main information desk  You need to re-schedule your appointment should you arrive 10 or more minutes late.  We strive to give you quality time with our providers, and arriving late affects you and other patients whose appointments are after yours.  Also, if you no show three or more times for appointments you may be dismissed from the clinic at the providers discretion.     Again, thank you for choosing [facility].  Our hope is that these requests will decrease the amount of time that you wait before being seen by our physicians.       _____________________________________________________________  Should you have questions after your visit to [facility], please contact our office at [phone] between the hours of 8:00 a.m. and 4:30 p.m.  Voicemails left after 4:00 p.m. will not be returned until the following business day.  For prescription refill requests, have your pharmacy contact our office and allow 72 hours.    Cancer Center Support Programs:   > Cancer Support Group  2nd Tuesday of the month 1pm-2pm, Journey Room   

## 2021-08-26 NOTE — Progress Notes (Signed)
Patient presents today for Keytruda infusion.  Patient is in satisfactory condition with no new complaints today.  Vital signs are stable.  Labs reviewed by Dr. Delton Coombes during her office visit.  Creatinine is 1.75 today.  We will add NS 500 mL over one hour today per Dr. Worthy Keeler.  We will proceed with treatment per MD orders.   Patient tolerated treatment well with no complaints voiced.  Patient left via wheelchair in stable condition.  Vital signs stable at discharge.  Follow up as scheduled.

## 2021-08-26 NOTE — Patient Instructions (Signed)
[facility]  Discharge Instructions: Thank you for choosing [facility] to provide your oncology and hematology care.  If you have a lab appointment with the Cancer Center, please come in thru the Main Entrance and check in at the main information desk.  Wear comfortable clothing and clothing appropriate for easy access to any Portacath or PICC line.   We strive to give you quality time with your provider. You may need to reschedule your appointment if you arrive late (15 or more minutes).  Arriving late affects you and other patients whose appointments are after yours.  Also, if you miss three or more appointments without notifying the office, you may be dismissed from the clinic at the provider's discretion.      For prescription refill requests, have your pharmacy contact our office and allow 72 hours for refills to be completed.        To help prevent nausea and vomiting after your treatment, we encourage you to take your nausea medication as directed.  BELOW ARE SYMPTOMS THAT SHOULD BE REPORTED IMMEDIATELY: *FEVER GREATER THAN 100.4 F (38 C) OR HIGHER *CHILLS OR SWEATING *NAUSEA AND VOMITING THAT IS NOT CONTROLLED WITH YOUR NAUSEA MEDICATION *UNUSUAL SHORTNESS OF BREATH *UNUSUAL BRUISING OR BLEEDING *URINARY PROBLEMS (pain or burning when urinating, or frequent urination) *BOWEL PROBLEMS (unusual diarrhea, constipation, pain near the anus) TENDERNESS IN MOUTH AND THROAT WITH OR WITHOUT PRESENCE OF ULCERS (sore throat, sores in mouth, or a toothache) UNUSUAL RASH, SWELLING OR PAIN  UNUSUAL VAGINAL DISCHARGE OR ITCHING   Items with * indicate a potential emergency and should be followed up as soon as possible or go to the Emergency Department if any problems should occur.  Please show the CHEMOTHERAPY ALERT CARD or IMMUNOTHERAPY ALERT CARD at check-in to the Emergency Department and triage nurse.  Should you have questions after your visit or need to cancel  or reschedule your appointment, please contact [facility] [phone]  and follow the prompts.  Office hours are 8:00 a.m. to 4:30 p.m. Monday - Friday. Please note that voicemails left after 4:00 p.m. may not be returned until the following business day.  We are closed weekends and major holidays. You have access to a nurse at all times for urgent questions. Please call the main number to the clinic [phone] and follow the prompts.  For any non-urgent questions, you may also contact your provider using MyChart. We now offer e-Visits for anyone 18 and older to request care online for non-urgent symptoms. For details visit [email or web address].   Also download the MyChart app! Go to the app store, search "MyChart", open the app, select [facility], and log in with your MyChart username and password.  Due to Covid, a mask is required upon entering the hospital/clinic. If you do not have a mask, one will be given to you upon arrival. For doctor visits, patients may have 1 support person aged 18 or older with them. For treatment visits, patients cannot have anyone with them due to current Covid guidelines and our immunocompromised population.  

## 2021-08-26 NOTE — Progress Notes (Signed)
Patient has been examined, vital signs and labs have been reviewed by Dr. Katragadda. ANC, Creatinine, LFTs, hemoglobin, and platelets are within treatment parameters per Dr. Katragadda. Patient may proceed with treatment per M.D.   

## 2021-09-15 NOTE — Progress Notes (Signed)
Jessica Taylor, Jessica Taylor   CLINIC:  Medical Oncology/Hematology  PCP:  Antony Contras, MD [address] / [place] [address] [phone]   REASON FOR VISIT:  Follow-up for stage I right breast cancer and colon cancer  PRIOR THERAPY:  1. Robotic proximal colectomy on 10/11/2019. 2. Right breast lumpectomy and SLNB on 04/02/2020  NGS Results: MSI--high, Foundation 1 not sent  CURRENT THERAPY: Keytruda every 3 weeks  BRIEF ONCOLOGIC HISTORY:  Oncology History  Cancer of ascending colon s/p robotic proximal colectomy 10/11/2019  10/11/2019 Initial Diagnosis   Cancer of ascending colon s/p robotic proximal colectomy 10/11/2019   10/23/2019 Cancer Staging   Staging form: Colon and Rectum, AJCC 8th Edition - Clinical stage from 10/23/2019: Stage IVA (cT3, cN1c, cM1a) - Signed by Derek Jack, MD on 12/14/2019    05/14/2020 -  Chemotherapy   Patient is on Treatment Plan : COLORECTAL Pembrolizumab q21d     Malignant neoplasm of right female breast ([place])  04/23/2020 Initial Diagnosis   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 [facility])   04/23/2020 Cancer Staging   Staging form: Breast, AJCC 8th Edition - Clinical stage from 04/23/2020: Stage IA (cT1c, cN0(sn), cM0, G2, ER+, PR+, HER2-) - Signed by Derek Jack, MD on 04/23/2020      CANCER STAGING: Cancer Staging Cancer of ascending colon s/p robotic proximal colectomy 10/11/2019 Staging form: Colon and Rectum, AJCC 8th Edition - Clinical stage from 10/23/2019: Stage IVA (cT3, cN1c, cM1a) - Signed by Derek Jack, MD on 12/14/2019  Malignant neoplasm of right female breast [facility]) Staging form: Breast, AJCC 8th Edition - Clinical stage from 04/23/2020: Stage IA (cT1c, cN0(sn), cM0, G2, ER+, PR+, HER2-) - Signed by Derek Jack, MD on 04/23/2020   INTERVAL HISTORY:  Jessica Taylor, a 80 y.o. female, returns for routine  follow-up and consideration for next cycle of chemotherapy. Jessica Taylor was last seen on 08/26/2021.  Due for cycle #22 of Keytruda today.   Overall, she tells me she has been feeling pretty well. She reports one episode of hematuria yesterday. She denies cough, SOB, skin rash, itching. Her appetite is good, and she denies ankle swellings.   Overall, she feels ready for next cycle of chemo today.   REVIEW OF SYSTEMS:  Review of Systems  Constitutional:  Negative for appetite change and fatigue (75%).  Respiratory:  Negative for cough and shortness of breath.   Cardiovascular:  Negative for leg swelling.  Genitourinary:  Positive for hematuria.   Skin:  Negative for itching and rash.  All other systems reviewed and are negative.  PAST MEDICAL/SURGICAL HISTORY:  Past Medical History:  Diagnosis Date   Abdominal aortic aneurysm (HCC)    Acid reflux    Anemia    Arthritis    knees , R shoulder - tx /w injection - 11/2014   Basal cell carcinoma (BCC) of dorsum of nose 2010   Resolved   Cancer ([place])    basal cell on nose   Colon cancer (HCC)    Coronary artery disease    Hypercholesteremia    Hypertension    Hypothyroidism    Lt Acute pyelonephritis 09/11/2018   MI, old 2000   Peripheral arterial disease ([place])    hhigh-grade ostial bilateral calcified iliac stenosis with claudication   Tobacco abuse    Vertigo    when lays on left side.   Past Surgical History:  Procedure Laterality Date  BREAST LUMPECTOMY WITH RADIOACTIVE SEED AND SENTINEL LYMPH NODE BIOPSY Bilateral 04/02/2020   Procedure: BILATERAL BREAST LUMPECTOMY WITH RADIOACTIVE SEED AND RIGHT SENTINEL LYMPH NODE BIOPSY AND RIGHT TARGETED AXILLARY LYMPH NODE BIOPSY;  Surgeon: Erroll Luna, MD;  Location: [place];  Service: General;  Laterality: Bilateral;   CARDIAC CATHETERIZATION  5 stents   CARDIAC CATHETERIZATION N/A 01/20/2016   Procedure: Left Heart Cath and Coronary Angiography;  Surgeon: Peter M  Martinique, MD;  Location: [place] [facility];  Service: Cardiovascular;  Laterality: N/A;   CATARACT EXTRACTION W/PHACO Left 05/24/2014   Procedure: CATARACT EXTRACTION PHACO AND INTRAOCULAR LENS PLACEMENT (IOC);  Surgeon: Tonny Branch, MD;  Location: [facility];  Service: Ophthalmology;  Laterality: Left;  CDE:  9.30   CATARACT EXTRACTION W/PHACO Right 06/18/2014   Procedure: CATARACT EXTRACTION PHACO AND INTRAOCULAR LENS PLACEMENT RIGHT EYE CDE=10.84;  Surgeon: Tonny Branch, MD;  Location: [facility];  Service: Ophthalmology;  Laterality: Right;   CORONARY ARTERY BYPASS GRAFT N/A 01/24/2016   Procedure: CORONARY ARTERY BYPASS GRAFTING (CABG);  Surgeon: Gaye Pollack, MD;  Location: [place];  Service: Open Heart Surgery;  Laterality: N/A;   CORONARY STENT PLACEMENT     CORONARY STENT PLACEMENT  03/06/15   CFX DES   CYSTOSCOPY/URETEROSCOPY/HOLMIUM LASER/STENT PLACEMENT Left 09/12/2018   Procedure: CYSTOSCOPY/URETEROSCOPY/STENT PLACEMENT;  Surgeon: Ceasar Mons, MD;  Location: [facility];  Service: Urology;  Laterality: Left;   EYE SURGERY     LEFT HEART CATH AND CORS/GRAFTS ANGIOGRAPHY N/A 09/12/2019   Procedure: LEFT HEART CATH AND CORS/GRAFTS ANGIOGRAPHY;  Surgeon: Troy Sine, MD;  Location: [place] [facility];  Service: Cardiovascular;  Laterality: N/A;   LEFT HEART CATHETERIZATION WITH CORONARY ANGIOGRAM N/A 03/06/2015   Procedure: LEFT HEART CATHETERIZATION WITH CORONARY ANGIOGRAM;  Surgeon: Lorretta Harp, MD;  Location: [facility] C[facility];  Service: Cardiovascular;  Laterality: N/A;   PARTIAL KNEE ARTHROPLASTY Right 02/04/2015   Procedure: UNICOMPARTMENTAL KNEE;  Surgeon: Dorna Leitz, MD;  Location: [place];  Service: Orthopedics;  Laterality: Right;   PERIPHERAL VASCULAR CATHETERIZATION Bilateral 05/13/2015   Procedure: Lower Extremity Angiography;  Surgeon: Lorretta Harp, MD;  Location: [place] [facility];  Service: Cardiovascular;  Laterality: Bilateral;   PERIPHERAL VASCULAR CATHETERIZATION N/A  05/13/2015   Procedure: Abdominal Aortogram;  Surgeon: Lorretta Harp, MD;  Location: [place] [facility];  Service: Cardiovascular;  Laterality: N/A;   PERIPHERAL VASCULAR CATHETERIZATION Bilateral 06/27/2015   Procedure: Peripheral Vascular Intervention;  Surgeon: Lorretta Harp, MD;  Location: [place] [facility];  Service: Cardiovascular;  Laterality: Bilateral;  ILIACS   PERIPHERAL VASCULAR CATHETERIZATION Bilateral 06/27/2015   Procedure: Peripheral Vascular Atherectomy;  Surgeon: Lorretta Harp, MD;  Location: [place] [facility];  Service: Cardiovascular;  Laterality: Bilateral;   PORTACATH PLACEMENT Left 05/24/2020   Procedure: INSERTION PORT-A-CATH;  Surgeon: Aviva Signs, MD;  Location: [facility];  Service: General;  Laterality: Left;   TEE WITHOUT CARDIOVERSION N/A 01/24/2016   Procedure: TRANSESOPHAGEAL ECHOCARDIOGRAM (TEE);  Surgeon: Gaye Pollack, MD;  Location: [place];  Service: Open Heart Surgery;  Laterality: N/A;   TONSILLECTOMY     age 86   TUBAL LIGATION      SOCIAL HISTORY:  Social History   Socioeconomic History   Marital status: Married    Spouse name: Tommy   Number of children: 4   Years of education: Not on file   Highest education level: Not on file  Occupational History   Occupation: retired  Comment: worked as Presenter, broadcasting for [employer]  Tobacco Use   Smoking status: Former    Packs/day: 1.50    Years: 42.00    Pack years: 63.00    Types: Cigarettes    Quit date: 05/19/1999    Years since quitting: 22.3   Smokeless tobacco: Never  Vaping Use   Vaping Use: Never used  Substance and Sexual Activity   Alcohol use: No   Drug use: No   Sexual activity: Yes    Birth control/protection: Surgical  Other Topics Concern   Not on file  Social History Narrative   Not on file   Social Determinants of Health   Financial Resource Strain: Medium Risk   Difficulty of Paying Living Expenses: Somewhat hard  Food Insecurity: No Food Insecurity    Worried About Charity fundraiser in the Last Year: Never true   Ran Out of Food in the Last Year: Never true  Transportation Needs: No Transportation Needs   Lack of Transportation (Medical): No   Lack of Transportation (Non-Medical): No  Physical Activity: Inactive   Days of Exercise per Week: 0 days   Minutes of Exercise per Session: 0 min  Stress: No Stress Concern Present   Feeling of Stress : Not at all  Social Connections: Moderately Isolated   Frequency of Communication with Friends and Family: More than three times a week   Frequency of Social Gatherings with Friends and Family: Three times a week   Attends Religious Services: Never   Active Member of Clubs or Organizations: No   Attends Archivist Meetings: Never   Marital Status: Married  Human resources officer Violence: Not At Risk   Fear of Current or Ex-Partner: No   Emotionally Abused: No   Physically Abused: No   Sexually Abused: No    FAMILY HISTORY:  Family History  Problem Relation Age of Onset   Ovarian cancer Mother 53   Cancer Father 40       unsure of which kind, "it was in his glands"   Hypertension Maternal Grandmother    Stroke Maternal Grandfather    Hypertension Son    Brain cancer Sister    Hypertension Daughter    Heart attack Neg Hx    Colon cancer Neg Hx    Esophageal cancer Neg Hx    Inflammatory bowel disease Neg Hx    Liver disease Neg Hx    Pancreatic cancer Neg Hx    Rectal cancer Neg Hx    Stomach cancer Neg Hx     CURRENT MEDICATIONS:  Current Outpatient Medications  Medication Sig Dispense Refill   amLODipine (NORVASC) 5 MG tablet Take 1 tablet (5 mg total) by mouth daily. 30 tablet 3   anastrozole (ARIMIDEX) 1 MG tablet TAKE 1 TABLET BY MOUTH ONCE DAILY. 30 tablet 6   aspirin EC 81 MG tablet Take 81 mg by mouth every evening.      atorvastatin (LIPITOR) 40 MG tablet Take 1 tablet (40 mg total) by mouth daily. Pt needs to make appt with provider for further refills. 1st  attempt 90 tablet 1   clopidogrel (PLAVIX) 75 MG tablet TAKE (1) TABLET BY MOUTH ONCE DAILY. 90 tablet 3   ergocalciferol (VITAMIN D2) 1.25 MG (50000 UT) capsule Take 1 capsule (50,000 Units total) by mouth once a week. 4 capsule 4   esomeprazole (NEXIUM) 20 MG capsule Take 20 mg by mouth daily.      HYDROcodone-acetaminophen (NORCO/VICODIN) 5-325 MG tablet  hydrOXYzine (ATARAX/VISTARIL) 25 MG tablet TAKE (1) TABLET BY MOUTH AT BEDTIME AS NEEDED (Patient not taking: Reported on 08/26/2021) 30 tablet 6   levothyroxine (SYNTHROID, LEVOTHROID) 50 MCG tablet Take 50 mcg by mouth daily before breakfast.     lidocaine-prilocaine (EMLA) cream Apply to affected area once 30 g 3   metoprolol tartrate (LOPRESSOR) 25 MG tablet Take 1 tablet (25 mg total) by mouth 2 (two) times daily. 180 tablet 3   nitroGLYCERIN (NITROSTAT) 0.4 MG SL tablet Place 1 tablet (0.4 mg total) under the tongue every 5 (five) minutes as needed for chest pain. (Patient not taking: Reported on 08/26/2021) 25 tablet prn   Pembrolizumab (KEYTRUDA IV) Inject 200 mg into the vein every 21 ( twenty-one) days.      pembrolizumab (KEYTRUDA) 100 MG/4ML SOLN See admin instructions.     prochlorperazine (COMPAZINE) 10 MG tablet Take 1 tablet (10 mg total) by mouth every 6 (six) hours as needed (Nausea or vomiting). (Patient not taking: Reported on 08/26/2021) 30 tablet 1   No current facility-administered medications for this visit.    ALLERGIES:  Allergies  Allergen Reactions   Crab [Shellfish Allergy] Nausea And Vomiting    Throws up violently   Ezetimibe Diarrhea   Other Nausea And Vomiting    Shrimp    PHYSICAL EXAM:  Performance status (ECOG): 1 - Symptomatic but completely ambulatory  There were no vitals filed for this visit. Wt Readings from Last 3 Encounters:  08/26/21 160 lb 12.8 oz (72.9 kg)  08/12/21 158 lb 12.8 oz (72 kg)  07/21/21 161 lb 9.6 oz (73.3 kg)   Physical Exam Vitals reviewed.  Constitutional:       Appearance: Normal appearance.  Cardiovascular:     Rate and Rhythm: Normal rate and regular rhythm.     Pulses: Normal pulses.     Heart sounds: Normal heart sounds.  Pulmonary:     Effort: Pulmonary effort is normal.     Breath sounds: Normal breath sounds.  Musculoskeletal:     Right lower leg: No edema.     Left lower leg: No edema.  Lymphadenopathy:     Upper Body:     Right upper body: No supraclavicular, axillary or pectoral adenopathy.     Left upper body: No supraclavicular, axillary or pectoral adenopathy.  Neurological:     General: No focal deficit present.     Mental Status: She is alert and oriented to person, place, and time.  Psychiatric:        Mood and Affect: Mood normal.        Behavior: Behavior normal.    LABORATORY DATA:  I have reviewed the labs as listed.  CBC Latest Ref Rng & Units 08/26/2021 08/12/2021 07/21/2021  WBC 4.0 - 10.5 K/uL 5.9 5.6 5.5  Hemoglobin 12.0 - 15.0 g/dL 13.2 13.8 13.8  Hematocrit 36.0 - 46.0 % 41.5 44.3 44.0  Platelets 150 - 400 K/uL 176 172 170   CMP Latest Ref Rng & Units 08/26/2021 08/12/2021 07/21/2021  Glucose 70 - 99 mg/dL 119(H) 107(H) 107(H)  BUN 8 - 23 mg/dL 20 25(H) 21  Creatinine 0.44 - 1.00 mg/dL 1.75(H) 2.23(H) 1.45(H)  Sodium 135 - 145 mmol/L 139 139 139  Potassium 3.5 - 5.1 mmol/L 3.7 4.0 4.3  Chloride 98 - 111 mmol/L 105 107 108  CO2 22 - 32 mmol/L 27 26 25   Calcium 8.9 - 10.3 mg/dL 9.3 9.5 9.3  Total Protein 6.5 - 8.1 g/dL 6.5 6.7 7.1  Total Bilirubin 0.3 - 1.2 mg/dL 0.8 1.0 0.8  Alkaline Phos 38 - 126 U/L 86 89 88  AST 15 - 41 U/L 20 18 20   ALT 0 - 44 U/L 15 14 14     DIAGNOSTIC IMAGING:  I have independently reviewed the scans and discussed with the patient. No results found.   ASSESSMENT:  1.  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  Right hemicolectomy on 10/11/2019, grade 3 adenocarcinoma, negative margins,  positive LVSI, 2 tumor deposits, 0/15 lymph nodes positive, PT3PN1C. 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  MLH1 hyper methylation present. -PET scan in December 2020 showed lymph node in the right axillary region.  Biopsy consistent with metastatic colon cancer. -Last CEA was 10.7 on 11/22/2019. -Right axillary lymph node excision on 04/02/2020 consistent with metastatic carcinoma from colon cancer. -Pembrolizumab started on 05/14/2020. -PET scan on 08/05/2020 shows complete resolution of lymphadenopathy.  No evidence of new areas of uptake.  Mild vague residual hypermetabolism in the right axilla without soft tissue mass. -PET scan on 12/23/2020 with no evidence of recurrence.   2.  Stage I (PT1CPN0) right Breast, Grade 2 Invasive Lobular Carcinoma: -MRI of the breast on 12/25/2019 showed 1 cm mass behind the right areola with a suspicious mass in the left breast. -Right breast lumpectomy on 04/02/2020 shows invasive lobular carcinoma, grade 2, 1.2 cm.  Resection margins are negative.  Negative LVSI.  2 sentinel lymph nodes were negative for carcinoma.  ER/PR 100% positive, HER-2 negative, Ki-67 10%.   PLAN:  1.  Stage IV colon cancer to the right axillary lymph node: -CT CAP from 08/08/2021 showed no evidence of lung or breast cancer recurrence in the chest, abdomen or pelvis. - She does not have any lymphadenopathy in the axillary region on examination. - She does not report any immunotherapy related side effects. - Reviewed labs which showed normal LFTs.  CBC was normal.  TSH was 0.45.  Last CEA was 9.7. - Continue pembrolizumab every 3 weeks.  RTC 6 weeks for follow-up.   2.  Right breast invasive lobular carcinoma, grade 2: -Continue anastrozole daily.  No major side effects.   3.  Osteoporosis: -Prior DEXA scan showed osteoporosis. - Continue vitamin D 50,000 units weekly.   4.  Bladder cancer: -She had 1 episode of hematuria. - She was told to call urology at [facility] if  it happens again.   5.  Elevated creatinine: -Baseline creatinine between 1.4-1.7. - Creatinine today is 1.42.   6.  Generalized pruritus: -Continue moisturizing lotion and Atarax as needed.   Orders placed this encounter:  No orders of the defined types were placed in this encounter.    Derek Jack, MD [place] [phone]   I, Thana Ates, am acting as a scribe for Dr. Derek Jack.  I, Derek Jack MD, have reviewed the above documentation for accuracy and completeness, and I agree with the above.

## 2021-09-16 ENCOUNTER — Inpatient Hospital Stay (HOSPITAL_COMMUNITY): Payer: Medicare Other

## 2021-09-16 ENCOUNTER — Other Ambulatory Visit: Payer: Self-pay

## 2021-09-16 ENCOUNTER — Inpatient Hospital Stay (HOSPITAL_BASED_OUTPATIENT_CLINIC_OR_DEPARTMENT_OTHER): Payer: Medicare Other | Admitting: Hematology

## 2021-09-16 VITALS — BP 154/58 | HR 55 | Temp 97.0°F | Resp 2 | Wt 162.3 lb

## 2021-09-16 VITALS — BP 145/62 | HR 57 | Temp 98.7°F | Resp 18

## 2021-09-16 DIAGNOSIS — Z5112 Encounter for antineoplastic immunotherapy: Secondary | ICD-10-CM | POA: Diagnosis not present

## 2021-09-16 DIAGNOSIS — C182 Malignant neoplasm of ascending colon: Secondary | ICD-10-CM

## 2021-09-16 DIAGNOSIS — C50911 Malignant neoplasm of unspecified site of right female breast: Secondary | ICD-10-CM | POA: Diagnosis not present

## 2021-09-16 DIAGNOSIS — Z79899 Other long term (current) drug therapy: Secondary | ICD-10-CM | POA: Diagnosis not present

## 2021-09-16 LAB — COMPREHENSIVE METABOLIC PANEL
ALT: 12 U/L (ref 0–44)
AST: 18 U/L (ref 15–41)
Albumin: 3.9 g/dL (ref 3.5–5.0)
Alkaline Phosphatase: 85 U/L (ref 38–126)
Anion gap: 8 (ref 5–15)
BUN: 19 mg/dL (ref 8–23)
CO2: 26 mmol/L (ref 22–32)
Calcium: 9.3 mg/dL (ref 8.9–10.3)
Chloride: 106 mmol/L (ref 98–111)
Creatinine, Ser: 1.42 mg/dL — ABNORMAL HIGH (ref 0.44–1.00)
GFR, Estimated: 37 mL/min — ABNORMAL LOW (ref >60)
Glucose, Bld: 114 mg/dL — ABNORMAL HIGH (ref 70–99)
Potassium: 3.7 mmol/L (ref 3.5–5.1)
Sodium: 140 mmol/L (ref 135–145)
Total Bilirubin: 0.9 mg/dL (ref 0.3–1.2)
Total Protein: 6.5 g/dL (ref 6.5–8.1)

## 2021-09-16 LAB — CBC WITH DIFFERENTIAL/PLATELET
Abs Immature Granulocytes: 0.02 10*3/uL (ref 0.00–0.07)
Basophils Absolute: 0.1 10*3/uL (ref 0.0–0.1)
Basophils Relative: 1 %
Eosinophils Absolute: 0.2 10*3/uL (ref 0.0–0.5)
Eosinophils Relative: 4 %
HCT: 41.2 % (ref 36.0–46.0)
Hemoglobin: 13.2 g/dL (ref 12.0–15.0)
Immature Granulocytes: 0 %
Lymphocytes Relative: 19 %
Lymphs Abs: 1 10*3/uL (ref 0.7–4.0)
MCH: 29.5 pg (ref 26.0–34.0)
MCHC: 32 g/dL (ref 30.0–36.0)
MCV: 92.2 fL (ref 80.0–100.0)
Monocytes Absolute: 0.4 10*3/uL (ref 0.1–1.0)
Monocytes Relative: 8 %
Neutro Abs: 3.7 10*3/uL (ref 1.7–7.7)
Neutrophils Relative %: 68 %
Platelets: 169 10*3/uL (ref 150–400)
RBC: 4.47 MIL/uL (ref 3.87–5.11)
RDW: 14.3 % (ref 11.5–15.5)
WBC: 5.5 10*3/uL (ref 4.0–10.5)
nRBC: 0 % (ref 0.0–0.2)

## 2021-09-16 LAB — TSH: TSH: 0.45 u[IU]/mL (ref 0.350–4.500)

## 2021-09-16 MED ORDER — SODIUM CHLORIDE 0.9% FLUSH
10.0000 mL | INTRAVENOUS | Status: DC | PRN
  Administered 2021-09-16: 10 mL

## 2021-09-16 MED ORDER — SODIUM CHLORIDE 0.9 % IV SOLN: Freq: Once | INTRAVENOUS | Status: AC

## 2021-09-16 MED ORDER — SODIUM CHLORIDE 0.9 % IV SOLN
200.0000 mg | Freq: Once | INTRAVENOUS | Status: AC
  Administered 2021-09-16: 200 mg via INTRAVENOUS
  Filled 2021-09-16: qty 8

## 2021-09-16 MED ORDER — HEPARIN SOD (PORK) LOCK FLUSH 100 UNIT/ML IV SOLN
500.0000 [IU] | Freq: Once | INTRAVENOUS | Status: AC | PRN
  Administered 2021-09-16: 500 [IU]

## 2021-09-16 NOTE — Progress Notes (Signed)
Jessica Taylor here for [place] today.  Okay for treatment per Dr Raliegh Ip.

## 2021-09-16 NOTE — Patient Instructions (Signed)
[facility]  Discharge Instructions: Thank you for choosing [facility] to provide your oncology and hematology care.  If you have a lab appointment with the Cancer Center, please come in thru the Main Entrance and check in at the main information desk.  Wear comfortable clothing and clothing appropriate for easy access to any Portacath or PICC line.   We strive to give you quality time with your provider. You may need to reschedule your appointment if you arrive late (15 or more minutes).  Arriving late affects you and other patients whose appointments are after yours.  Also, if you miss three or more appointments without notifying the office, you may be dismissed from the clinic at the provider's discretion.      For prescription refill requests, have your pharmacy contact our office and allow 72 hours for refills to be completed.    Today you received the following chemotherapy and/or immunotherapy agents keytruda      To help prevent nausea and vomiting after your treatment, we encourage you to take your nausea medication as directed.  BELOW ARE SYMPTOMS THAT SHOULD BE REPORTED IMMEDIATELY: *FEVER GREATER THAN 100.4 F (38 C) OR HIGHER *CHILLS OR SWEATING *NAUSEA AND VOMITING THAT IS NOT CONTROLLED WITH YOUR NAUSEA MEDICATION *UNUSUAL SHORTNESS OF BREATH *UNUSUAL BRUISING OR BLEEDING *URINARY PROBLEMS (pain or burning when urinating, or frequent urination) *BOWEL PROBLEMS (unusual diarrhea, constipation, pain near the anus) TENDERNESS IN MOUTH AND THROAT WITH OR WITHOUT PRESENCE OF ULCERS (sore throat, sores in mouth, or a toothache) UNUSUAL RASH, SWELLING OR PAIN  UNUSUAL VAGINAL DISCHARGE OR ITCHING   Items with * indicate a potential emergency and should be followed up as soon as possible or go to the Emergency Department if any problems should occur.  Please show the CHEMOTHERAPY ALERT CARD or IMMUNOTHERAPY ALERT CARD at check-in to the Emergency  Department and triage nurse.  Should you have questions after your visit or need to cancel or reschedule your appointment, please contact [facility] [phone]  and follow the prompts.  Office hours are 8:00 a.m. to 4:30 p.m. Monday - Friday. Please note that voicemails left after 4:00 p.m. may not be returned until the following business day.  We are closed weekends and major holidays. You have access to a nurse at all times for urgent questions. Please call the main number to the clinic [phone] and follow the prompts.  For any non-urgent questions, you may also contact your provider using MyChart. We now offer e-Visits for anyone 18 and older to request care online for non-urgent symptoms. For details visit [email or web address].   Also download the MyChart app! Go to the app store, search "MyChart", open the app, select , and log in with your MyChart username and password.  Due to Covid, a mask is required upon entering the hospital/clinic. If you do not have a mask, one will be given to you upon arrival. For doctor visits, patients may have 1 support person aged 18 or older with them. For treatment visits, patients cannot have anyone with them due to current Covid guidelines and our immunocompromised population.     Pembrolizumab injection What is this medication? PEMBROLIZUMAB (pem broe liz ue mab) is a monoclonal antibody. It is used totreat certain types of cancer. This medicine may be used for other purposes; ask your health care provider orpharmacist if you have questions. COMMON BRAND NAME(S): Keytruda What should I tell my care team before I take   this medication? They need to know if you have any of these conditions: autoimmune diseases like Crohn's disease, ulcerative colitis, or lupus have had or planning to have an allogeneic stem cell transplant (uses someone else's stem cells) history of organ transplant history of chest radiation nervous system  problems like myasthenia gravis or Guillain-Barre syndrome an unusual or allergic reaction to pembrolizumab, other medicines, foods, dyes, or preservatives pregnant or trying to get pregnant breast-feeding How should I use this medication? This medicine is for infusion into a vein. It is given by a health careprofessional in a hospital or clinic setting. A special MedGuide will be given to you before each treatment. Be sure to readthis information carefully each time. Talk to your pediatrician regarding the use of this medicine in children. While this drug may be prescribed for children as young as 6 months for selectedconditions, precautions do apply. Overdosage: If you think you have taken too much of this medicine contact apoison control center or emergency room at once. NOTE: This medicine is only for you. Do not share this medicine with others. What if I miss a dose? It is important not to miss your dose. Call your doctor or health careprofessional if you are unable to keep an appointment. What may interact with this medication? Interactions have not been studied. This list may not describe all possible interactions. Give your health care provider a list of all the medicines, herbs, non-prescription drugs, or dietary supplements you use. Also tell them if you smoke, drink alcohol, or use illegaldrugs. Some items may interact with your medicine. What should I watch for while using this medication? Your condition will be monitored carefully while you are receiving thismedicine. You may need blood work done while you are taking this medicine. Do not become pregnant while taking this medicine or for 4 months after stopping it. Women should inform their doctor if they wish to become pregnant or think they might be pregnant. There is a potential for serious side effects to an unborn child. Talk to your health care professional or pharmacist for more information. Do not breast-feed an infant while  taking this medicine orfor 4 months after the last dose. What side effects may I notice from receiving this medication? Side effects that you should report to your doctor or health care professionalas soon as possible: allergic reactions like skin rash, itching or hives, swelling of the face, lips, or tongue bloody or black, tarry breathing problems changes in vision chest pain chills confusion constipation cough diarrhea dizziness or feeling faint or lightheaded fast or irregular heartbeat fever flushing joint pain low blood counts - this medicine may decrease the number of white blood cells, red blood cells and platelets. You may be at increased risk for infections and bleeding. muscle pain muscle weakness pain, tingling, numbness in the hands or feet persistent headache redness, blistering, peeling or loosening of the skin, including inside the mouth signs and symptoms of high blood sugar such as dizziness; dry mouth; dry skin; fruity breath; nausea; stomach pain; increased hunger or thirst; increased urination signs and symptoms of kidney injury like trouble passing urine or change in the amount of urine signs and symptoms of liver injury like dark urine, light-colored stools, loss of appetite, nausea, right upper belly pain, yellowing of the eyes or skin sweating swollen lymph nodes weight loss Side effects that usually do not require medical attention (report to yourdoctor or health care professional if they continue or are bothersome): decreased appetite hair loss   tiredness This list may not describe all possible side effects. Call your doctor for medical advice about side effects. You may report side effects to FDA at1-800-FDA-1088. Where should I keep my medication? This drug is given in a hospital or clinic and will not be stored at home. NOTE: This sheet is a summary. It may not cover all possible information. If you have questions about this medicine, talk to your  doctor, pharmacist, orhealth care provider.  2022 Elsevier/Gold Standard (2019-10-11 21:44:53)  

## 2021-09-16 NOTE — Progress Notes (Signed)
Keytruda given today per MD orders. Tolerated infusion without adverse affects. Vital signs stable. No complaints at this time. Discharged from clinic ambulatory in stable condition. Alert and oriented x 3. F/U with [facility] as scheduled.  

## 2021-09-16 NOTE — Patient Instructions (Signed)
[place] at [facility] Discharge Instructions  You were seen and examined by Dr. Delton Coombes. You will proceed with Keytruda infusion today and every 3 weeks. Return as scheduled in 6 week for lab work, office visit, and infusion.    Thank you for choosing [place] at [facility] to provide your oncology and hematology care.  To afford each patient quality time with our provider, please arrive at least 15 minutes before your scheduled appointment time.   If you have a lab appointment with the [facility] please come in thru the Main Entrance and check in at the main information desk.  You need to re-schedule your appointment should you arrive 10 or more minutes late.  We strive to give you quality time with our providers, and arriving late affects you and other patients whose appointments are after yours.  Also, if you no show three or more times for appointments you may be dismissed from the clinic at the providers discretion.     Again, thank you for choosing [facility].  Our hope is that these requests will decrease the amount of time that you wait before being seen by our physicians.       _____________________________________________________________  Should you have questions after your visit to [facility], please contact our office at [phone] and follow the prompts.  Our office hours are 8:00 a.m. and 4:30 p.m. Monday - Friday.  Please note that voicemails left after 4:00 p.m. may not be returned until the following business day.  We are closed weekends and major holidays.  You do have access to a nurse 24-7, just call the main number to the clinic [phone] and do not press any options, hold on the line and a nurse will answer the phone.    For prescription refill requests, have your pharmacy contact our office and allow 72 hours.    Due to Covid, you will need to wear a mask upon entering the  hospital. If you do not have a mask, a mask will be given to you at the Main Entrance upon arrival. For doctor visits, patients may have 1 support person age 31 or older with them. For treatment visits, patients can not have anyone with them due to social distancing guidelines and our immunocompromised population.

## 2021-09-16 NOTE — Progress Notes (Signed)
Patient has been examined, vital signs and labs have been reviewed by Dr. Katragadda. ANC, Creatinine, LFTs, hemoglobin, and platelets are within treatment parameters per Dr. Katragadda. Patient may proceed with treatment per M.D.   

## 2021-09-18 ENCOUNTER — Encounter (HOSPITAL_COMMUNITY): Payer: Self-pay | Admitting: Hematology

## 2021-09-19 ENCOUNTER — Other Ambulatory Visit (HOSPITAL_COMMUNITY): Payer: Self-pay | Admitting: Hematology

## 2021-09-22 ENCOUNTER — Encounter (HOSPITAL_COMMUNITY): Payer: Self-pay | Admitting: Hematology

## 2021-10-07 ENCOUNTER — Inpatient Hospital Stay (HOSPITAL_COMMUNITY): Payer: Medicare Other

## 2021-10-07 ENCOUNTER — Inpatient Hospital Stay (HOSPITAL_COMMUNITY): Payer: Medicare Other | Attending: Hematology

## 2021-10-07 ENCOUNTER — Other Ambulatory Visit: Payer: Self-pay

## 2021-10-07 VITALS — BP 129/60 | HR 55 | Temp 97.8°F | Resp 18

## 2021-10-07 DIAGNOSIS — C182 Malignant neoplasm of ascending colon: Secondary | ICD-10-CM | POA: Insufficient documentation

## 2021-10-07 DIAGNOSIS — Z170 Estrogen receptor positive status [ER+]: Secondary | ICD-10-CM | POA: Diagnosis not present

## 2021-10-07 DIAGNOSIS — Z5112 Encounter for antineoplastic immunotherapy: Secondary | ICD-10-CM | POA: Insufficient documentation

## 2021-10-07 DIAGNOSIS — C50911 Malignant neoplasm of unspecified site of right female breast: Secondary | ICD-10-CM

## 2021-10-07 DIAGNOSIS — Z79899 Other long term (current) drug therapy: Secondary | ICD-10-CM | POA: Insufficient documentation

## 2021-10-07 LAB — CBC WITH DIFFERENTIAL/PLATELET
Abs Immature Granulocytes: 0.03 10*3/uL (ref 0.00–0.07)
Basophils Absolute: 0.1 10*3/uL (ref 0.0–0.1)
Basophils Relative: 1 %
Eosinophils Absolute: 0 10*3/uL (ref 0.0–0.5)
Eosinophils Relative: 0 %
HCT: 41.6 % (ref 36.0–46.0)
Hemoglobin: 13.3 g/dL (ref 12.0–15.0)
Immature Granulocytes: 0 %
Lymphocytes Relative: 11 %
Lymphs Abs: 1 10*3/uL (ref 0.7–4.0)
MCH: 29.6 pg (ref 26.0–34.0)
MCHC: 32 g/dL (ref 30.0–36.0)
MCV: 92.4 fL (ref 80.0–100.0)
Monocytes Absolute: 0.6 10*3/uL (ref 0.1–1.0)
Monocytes Relative: 6 %
Neutro Abs: 7.2 10*3/uL (ref 1.7–7.7)
Neutrophils Relative %: 82 %
Platelets: 196 10*3/uL (ref 150–400)
RBC: 4.5 MIL/uL (ref 3.87–5.11)
RDW: 14.6 % (ref 11.5–15.5)
WBC: 8.8 10*3/uL (ref 4.0–10.5)
nRBC: 0 % (ref 0.0–0.2)

## 2021-10-07 LAB — COMPREHENSIVE METABOLIC PANEL
ALT: 13 U/L (ref 0–44)
AST: 19 U/L (ref 15–41)
Albumin: 4 g/dL (ref 3.5–5.0)
Alkaline Phosphatase: 86 U/L (ref 38–126)
Anion gap: 7 (ref 5–15)
BUN: 20 mg/dL (ref 8–23)
CO2: 25 mmol/L (ref 22–32)
Calcium: 9.3 mg/dL (ref 8.9–10.3)
Chloride: 105 mmol/L (ref 98–111)
Creatinine, Ser: 1.82 mg/dL — ABNORMAL HIGH (ref 0.44–1.00)
GFR, Estimated: 27 mL/min — ABNORMAL LOW (ref >60)
Glucose, Bld: 104 mg/dL — ABNORMAL HIGH (ref 70–99)
Potassium: 3.8 mmol/L (ref 3.5–5.1)
Sodium: 137 mmol/L (ref 135–145)
Total Bilirubin: 0.9 mg/dL (ref 0.3–1.2)
Total Protein: 6.8 g/dL (ref 6.5–8.1)

## 2021-10-07 LAB — TSH: TSH: 0.382 u[IU]/mL (ref 0.350–4.500)

## 2021-10-07 MED ORDER — SODIUM CHLORIDE 0.9 % IV SOLN: Freq: Once | INTRAVENOUS | Status: DC

## 2021-10-07 MED ORDER — SODIUM CHLORIDE 0.9 % IV SOLN: Freq: Once | INTRAVENOUS | Status: AC

## 2021-10-07 MED ORDER — HEPARIN SOD (PORK) LOCK FLUSH 100 UNIT/ML IV SOLN
500.0000 [IU] | Freq: Once | INTRAVENOUS | Status: AC | PRN
  Administered 2021-10-07: 500 [IU]

## 2021-10-07 MED ORDER — SODIUM CHLORIDE 0.9 % IV SOLN
200.0000 mg | Freq: Once | INTRAVENOUS | Status: AC
  Administered 2021-10-07: 200 mg via INTRAVENOUS
  Filled 2021-10-07: qty 8

## 2021-10-07 MED ORDER — SODIUM CHLORIDE 0.9% FLUSH
10.0000 mL | INTRAVENOUS | Status: DC | PRN
  Administered 2021-10-07: 10 mL

## 2021-10-07 NOTE — Progress Notes (Signed)
Patient presents today for [facility] per providers orders.  Vital signs within parameters for treatment.  Labs pending.  Patient has no new complaints at this time.  Creatinine noted to be 1.82, Dr. Delton Coombes notified and order received to give 500 cc saline over one hour with Keytruda.  Keytruda and 500 cc NS given today per MD orders.  Stable during infusions without adverse affects.  Vital signs stable.  No complaints at this time.  Discharge from clinic ambulatory in stable condition.  Alert and oriented X 3.  Follow up with [facility] as scheduled.

## 2021-10-07 NOTE — Progress Notes (Signed)
Patients port flushed without difficulty.  Good blood return noted with no bruising or swelling noted at site.  Stable during access and blood draw.  Patient to remain accessed for treatment. 

## 2021-10-07 NOTE — Patient Instructions (Signed)
[place]  Discharge Instructions: Thank you for choosing [facility] to provide your oncology and hematology care.  If you have a lab appointment with the [facility], please come in thru the Main Entrance and check in at the main information desk.  Wear comfortable clothing and clothing appropriate for easy access to any Portacath or PICC line.   We strive to give you quality time with your provider. You may need to reschedule your appointment if you arrive late (15 or more minutes).  Arriving late affects you and other patients whose appointments are after yours.  Also, if you miss three or more appointments without notifying the office, you may be dismissed from the clinic at the provider's discretion.      For prescription refill requests, have your pharmacy contact our office and allow 72 hours for refills to be completed.    Today you received the following chemotherapy and/or immunotherapy agents Keytruda 500 cc bolus      To help prevent nausea and vomiting after your treatment, we encourage you to take your nausea medication as directed.  BELOW ARE SYMPTOMS THAT SHOULD BE REPORTED IMMEDIATELY: *FEVER GREATER THAN 100.4 F (38 C) OR HIGHER *CHILLS OR SWEATING *NAUSEA AND VOMITING THAT IS NOT CONTROLLED WITH YOUR NAUSEA MEDICATION *UNUSUAL SHORTNESS OF BREATH *UNUSUAL BRUISING OR BLEEDING *URINARY PROBLEMS (pain or burning when urinating, or frequent urination) *BOWEL PROBLEMS (unusual diarrhea, constipation, pain near the anus) TENDERNESS IN MOUTH AND THROAT WITH OR WITHOUT PRESENCE OF ULCERS (sore throat, sores in mouth, or a toothache) UNUSUAL RASH, SWELLING OR PAIN  UNUSUAL VAGINAL DISCHARGE OR ITCHING   Items with * indicate a potential emergency and should be followed up as soon as possible or go to the Emergency Department if any problems should occur.  Please show the CHEMOTHERAPY ALERT CARD or IMMUNOTHERAPY ALERT CARD at check-in to the  Emergency Department and triage nurse.  Should you have questions after your visit or need to cancel or reschedule your appointment, please contact [facility] [phone]  and follow the prompts.  Office hours are 8:00 a.m. to 4:30 p.m. Monday - Friday. Please note that voicemails left after 4:00 p.m. may not be returned until the following business day.  We are closed weekends and major holidays. You have access to a nurse at all times for urgent questions. Please call the main number to the clinic [phone] and follow the prompts.  For any non-urgent questions, you may also contact your provider using MyChart. We now offer e-Visits for anyone 51 and older to request care online for non-urgent symptoms. For details visit [email or web address].   Also download the MyChart app! Go to the app store, search "MyChart", open the app, select , and log in with your MyChart username and password.  Due to Covid, a mask is required upon entering the hospital/clinic. If you do not have a mask, one will be given to you upon arrival. For doctor visits, patients may have 1 support person aged 70 or older with them. For treatment visits, patients cannot have anyone with them due to current Covid guidelines and our immunocompromised population.

## 2021-10-08 LAB — CEA: CEA: 9.3 ng/mL — ABNORMAL HIGH (ref 0.0–4.7)

## 2021-10-14 ENCOUNTER — Other Ambulatory Visit: Payer: Self-pay

## 2021-10-14 ENCOUNTER — Telehealth (HOSPITAL_COMMUNITY): Payer: Self-pay | Admitting: *Deleted

## 2021-10-14 ENCOUNTER — Emergency Department (HOSPITAL_COMMUNITY)
Admission: EM | Admit: 2021-10-14 | Discharge: 2021-10-14 | Disposition: A | Discharge status: Home / Self Care | Payer: Medicare Other | Attending: Emergency Medicine | Admitting: Emergency Medicine

## 2021-10-14 ENCOUNTER — Encounter (HOSPITAL_COMMUNITY): Payer: Self-pay | Admitting: *Deleted

## 2021-10-14 ENCOUNTER — Emergency Department (HOSPITAL_COMMUNITY): Payer: Medicare Other

## 2021-10-14 DIAGNOSIS — R002 Palpitations: Secondary | ICD-10-CM | POA: Insufficient documentation

## 2021-10-14 DIAGNOSIS — I129 Hypertensive chronic kidney disease with stage 1 through stage 4 chronic kidney disease, or unspecified chronic kidney disease: Secondary | ICD-10-CM | POA: Insufficient documentation

## 2021-10-14 DIAGNOSIS — E039 Hypothyroidism, unspecified: Secondary | ICD-10-CM | POA: Insufficient documentation

## 2021-10-14 DIAGNOSIS — Z853 Personal history of malignant neoplasm of breast: Secondary | ICD-10-CM | POA: Insufficient documentation

## 2021-10-14 DIAGNOSIS — Z7982 Long term (current) use of aspirin: Secondary | ICD-10-CM | POA: Diagnosis not present

## 2021-10-14 DIAGNOSIS — N183 Chronic kidney disease, stage 3 unspecified: Secondary | ICD-10-CM | POA: Diagnosis not present

## 2021-10-14 DIAGNOSIS — Z85828 Personal history of other malignant neoplasm of skin: Secondary | ICD-10-CM | POA: Insufficient documentation

## 2021-10-14 DIAGNOSIS — Z955 Presence of coronary angioplasty implant and graft: Secondary | ICD-10-CM | POA: Insufficient documentation

## 2021-10-14 DIAGNOSIS — I25110 Atherosclerotic heart disease of native coronary artery with unstable angina pectoris: Secondary | ICD-10-CM | POA: Insufficient documentation

## 2021-10-14 DIAGNOSIS — Z87891 Personal history of nicotine dependence: Secondary | ICD-10-CM | POA: Insufficient documentation

## 2021-10-14 DIAGNOSIS — I10 Essential (primary) hypertension: Secondary | ICD-10-CM | POA: Diagnosis not present

## 2021-10-14 DIAGNOSIS — R000 Tachycardia, unspecified: Secondary | ICD-10-CM | POA: Diagnosis not present

## 2021-10-14 DIAGNOSIS — Z96651 Presence of right artificial knee joint: Secondary | ICD-10-CM | POA: Diagnosis not present

## 2021-10-14 DIAGNOSIS — C189 Malignant neoplasm of colon, unspecified: Secondary | ICD-10-CM | POA: Diagnosis not present

## 2021-10-14 DIAGNOSIS — N3001 Acute cystitis with hematuria: Secondary | ICD-10-CM | POA: Insufficient documentation

## 2021-10-14 DIAGNOSIS — Z85038 Personal history of other malignant neoplasm of large intestine: Secondary | ICD-10-CM | POA: Diagnosis not present

## 2021-10-14 DIAGNOSIS — Z79899 Other long term (current) drug therapy: Secondary | ICD-10-CM | POA: Insufficient documentation

## 2021-10-14 DIAGNOSIS — R319 Hematuria, unspecified: Secondary | ICD-10-CM | POA: Diagnosis present

## 2021-10-14 LAB — URINALYSIS, ROUTINE W REFLEX MICROSCOPIC
Bilirubin Urine: NEGATIVE
Glucose, UA: 50 mg/dL — AB
Ketones, ur: NEGATIVE mg/dL
Nitrite: NEGATIVE
Protein, ur: 100 mg/dL — AB
RBC / HPF: >50 RBC/hpf — ABNORMAL HIGH (ref 0–5)
Specific Gravity, Urine: 1.023 (ref 1.005–1.030)
WBC, UA: >50 WBC/hpf — ABNORMAL HIGH (ref 0–5)
pH: 5 (ref 5.0–8.0)

## 2021-10-14 LAB — CBC WITH DIFFERENTIAL/PLATELET
Abs Immature Granulocytes: 0.02 10*3/uL (ref 0.00–0.07)
Basophils Absolute: 0.1 10*3/uL (ref 0.0–0.1)
Basophils Relative: 1 %
Eosinophils Absolute: 0.4 10*3/uL (ref 0.0–0.5)
Eosinophils Relative: 5 %
HCT: 47.8 % — ABNORMAL HIGH (ref 36.0–46.0)
Hemoglobin: 15.1 g/dL — ABNORMAL HIGH (ref 12.0–15.0)
Immature Granulocytes: 0 %
Lymphocytes Relative: 14 %
Lymphs Abs: 1 10*3/uL (ref 0.7–4.0)
MCH: 29.1 pg (ref 26.0–34.0)
MCHC: 31.6 g/dL (ref 30.0–36.0)
MCV: 92.1 fL (ref 80.0–100.0)
Monocytes Absolute: 0.4 10*3/uL (ref 0.1–1.0)
Monocytes Relative: 6 %
Neutro Abs: 5.4 10*3/uL (ref 1.7–7.7)
Neutrophils Relative %: 74 %
Platelets: 232 10*3/uL (ref 150–400)
RBC: 5.19 MIL/uL — ABNORMAL HIGH (ref 3.87–5.11)
RDW: 14.4 % (ref 11.5–15.5)
WBC: 7.3 10*3/uL (ref 4.0–10.5)
nRBC: 0 % (ref 0.0–0.2)

## 2021-10-14 LAB — COMPREHENSIVE METABOLIC PANEL
ALT: 15 U/L (ref 0–44)
AST: 24 U/L (ref 15–41)
Albumin: 4.6 g/dL (ref 3.5–5.0)
Alkaline Phosphatase: 100 U/L (ref 38–126)
Anion gap: 6 (ref 5–15)
BUN: 16 mg/dL (ref 8–23)
CO2: 28 mmol/L (ref 22–32)
Calcium: 10.1 mg/dL (ref 8.9–10.3)
Chloride: 104 mmol/L (ref 98–111)
Creatinine, Ser: 1.55 mg/dL — ABNORMAL HIGH (ref 0.44–1.00)
GFR, Estimated: 33 mL/min — ABNORMAL LOW (ref >60)
Glucose, Bld: 113 mg/dL — ABNORMAL HIGH (ref 70–99)
Potassium: 3.7 mmol/L (ref 3.5–5.1)
Sodium: 138 mmol/L (ref 135–145)
Total Bilirubin: 1.2 mg/dL (ref 0.3–1.2)
Total Protein: 7.7 g/dL (ref 6.5–8.1)

## 2021-10-14 MED ORDER — CEFDINIR 300 MG PO CAPS
300.0000 mg | ORAL_CAPSULE | Freq: Once | ORAL | Status: AC
  Administered 2021-10-14: 300 mg via ORAL
  Filled 2021-10-14: qty 1

## 2021-10-14 MED ORDER — CEFDINIR 300 MG PO CAPS: 300.0000 mg | ORAL_CAPSULE | Freq: Every day | ORAL | 0 refills | Status: AC

## 2021-10-14 NOTE — ED Notes (Signed)
Pt alert, NAD, calm, interactive, resps e/u, speaking in clear complete sentences. To xray by w/c.

## 2021-10-14 NOTE — ED Notes (Signed)
Pt currently on chemo, last chemo a week ago.

## 2021-10-14 NOTE — ED Notes (Signed)
Verbalizes can void if she can drink some water. Water given. Will try in a few minutes.

## 2021-10-14 NOTE — ED Provider Notes (Signed)
[facility] EMERGENCY DEPARTMENT Provider Note   CSN: [id number] Arrival date & time: 10/14/21  1035     History Chief Complaint  Patient presents with   Tachycardia    Jessica Taylor is a 80 y.o. female.  HPI  Patient presented to the ER for evaluation of palpitations.  Patient states over the last few days she has felt that her heart has been racing.  She noticed that when she was walking today her heart went up to 103.  She has been having issues with hematuria over the last several days.  It was heavier and then resolved.  She has not noticed anything today.  Patient states she has a history of having trouble with anemia.  She is on Plavix.  Patient felt that her blood count was getting too low and that was why her heart was racing.  She has not had any chest pain.  No fevers.  No shortness of breath.  No abdominal pain. Past Medical History:  Diagnosis Date   Abdominal aortic aneurysm    Acid reflux    Anemia    Arthritis    knees , R shoulder - tx /w injection - 11/2014   Basal cell carcinoma (BCC) of dorsum of nose 2010   Resolved   Cancer ([place])    basal cell on nose   Colon cancer (HCC)    Coronary artery disease    Hypercholesteremia    Hypertension    Hypothyroidism    Lt Acute pyelonephritis 09/11/2018   MI, old 2000   Peripheral arterial disease ([place])    hhigh-grade ostial bilateral calcified iliac stenosis with claudication   Tobacco abuse    Vertigo    when lays on left side.    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   Cancer of ascending colon s/p robotic proximal colectomy 10/11/2019 10/11/2019   Dizziness 10/11/2019   CKD (chronic kidney disease) stage 3, GFR 30-59 ml/min (HCC) 10/11/2019   Abnormal nuclear stress test    Lower abdominal pain 07/23/2019   Anorexia 07/23/2019   Abnormal CT of the abdomen 07/23/2019   Iron  deficiency anemia 09/13/2018   Acute renal failure with acute tubular necrosis superimposed on stage 2 chronic kidney disease ([place])    Pyelonephritis 09/11/2018   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   Coronary artery disease involving native coronary artery of native heart without angina pectoris    Hematuria 01/17/2016   Coronary artery disease involving native coronary artery of native heart with unstable angina pectoris (HCC)    Claudication ([place]) 06/27/2015   Acute renal insufficiency post PCI- SCr1.34 03/08/2015   STEMI- urgent CFX PCI 03/06/15 03/06/2015   ICM 35% at cath but EF 60% by echo 03/07/15 03/06/2015   Hypokalemia 03/06/2015   Hyperglycemia 03/06/2015   PAD (peripheral artery disease) ([place]) 03/06/2015   Aortoiliac occlusive disease ([place])    Gastroesophageal reflux disease 02/08/2015   CAD S/P prior PCI to LAD and RCA 02/08/2015   Hypothyroidism 02/08/2015   Hypoxia 02/08/2015   Status post right partial knee replacement    Primary osteoarthritis of right knee 02/04/2015   Old MI (myocardial infarction) 06/06/2014   Atherosclerosis of native coronary artery of native heart without angina pectoris 06/06/2014   Mixed hyperlipidemia 06/06/2014   Essential hypertension 06/06/2014    Past Surgical History:  Procedure Laterality Date   BREAST  LUMPECTOMY WITH RADIOACTIVE SEED AND SENTINEL LYMPH NODE BIOPSY Bilateral 04/02/2020   Procedure: BILATERAL BREAST LUMPECTOMY WITH RADIOACTIVE SEED AND RIGHT SENTINEL LYMPH NODE BIOPSY AND RIGHT TARGETED AXILLARY LYMPH NODE BIOPSY;  Surgeon: Erroll Luna, MD;  Location: [place];  Service: General;  Laterality: Bilateral;   CARDIAC CATHETERIZATION  5 stents   CARDIAC CATHETERIZATION N/A 01/20/2016   Procedure: Left Heart Cath and Coronary Angiography;  Surgeon: Peter M Martinique, MD;  Location: [place] [facility];  Service: Cardiovascular;  Laterality: N/A;   CATARACT EXTRACTION W/PHACO Left 05/24/2014   Procedure: CATARACT EXTRACTION  PHACO AND INTRAOCULAR LENS PLACEMENT (IOC);  Surgeon: Tonny Branch, MD;  Location: [facility];  Service: Ophthalmology;  Laterality: Left;  CDE:  9.30   CATARACT EXTRACTION W/PHACO Right 06/18/2014   Procedure: CATARACT EXTRACTION PHACO AND INTRAOCULAR LENS PLACEMENT RIGHT EYE CDE=10.84;  Surgeon: Tonny Branch, MD;  Location: [facility];  Service: Ophthalmology;  Laterality: Right;   CORONARY ARTERY BYPASS GRAFT N/A 01/24/2016   Procedure: CORONARY ARTERY BYPASS GRAFTING (CABG);  Surgeon: Gaye Pollack, MD;  Location: [place];  Service: Open Heart Surgery;  Laterality: N/A;   CORONARY STENT PLACEMENT     CORONARY STENT PLACEMENT  03/06/15   CFX DES   CYSTOSCOPY/URETEROSCOPY/HOLMIUM LASER/STENT PLACEMENT Left 09/12/2018   Procedure: CYSTOSCOPY/URETEROSCOPY/STENT PLACEMENT;  Surgeon: Ceasar Mons, MD;  Location: [facility];  Service: Urology;  Laterality: Left;   EYE SURGERY     LEFT HEART CATH AND CORS/GRAFTS ANGIOGRAPHY N/A 09/12/2019   Procedure: LEFT HEART CATH AND CORS/GRAFTS ANGIOGRAPHY;  Surgeon: Troy Sine, MD;  Location: [place] [facility];  Service: Cardiovascular;  Laterality: N/A;   LEFT HEART CATHETERIZATION WITH CORONARY ANGIOGRAM N/A 03/06/2015   Procedure: LEFT HEART CATHETERIZATION WITH CORONARY ANGIOGRAM;  Surgeon: Lorretta Harp, MD;  Location: [facility] C[facility];  Service: Cardiovascular;  Laterality: N/A;   PARTIAL KNEE ARTHROPLASTY Right 02/04/2015   Procedure: UNICOMPARTMENTAL KNEE;  Surgeon: Dorna Leitz, MD;  Location: [place];  Service: Orthopedics;  Laterality: Right;   PERIPHERAL VASCULAR CATHETERIZATION Bilateral 05/13/2015   Procedure: Lower Extremity Angiography;  Surgeon: Lorretta Harp, MD;  Location: [place] [facility];  Service: Cardiovascular;  Laterality: Bilateral;   PERIPHERAL VASCULAR CATHETERIZATION N/A 05/13/2015   Procedure: Abdominal Aortogram;  Surgeon: Lorretta Harp, MD;  Location: [place] [facility];  Service: Cardiovascular;  Laterality: N/A;   PERIPHERAL  VASCULAR CATHETERIZATION Bilateral 06/27/2015   Procedure: Peripheral Vascular Intervention;  Surgeon: Lorretta Harp, MD;  Location: [place] [facility];  Service: Cardiovascular;  Laterality: Bilateral;  ILIACS   PERIPHERAL VASCULAR CATHETERIZATION Bilateral 06/27/2015   Procedure: Peripheral Vascular Atherectomy;  Surgeon: Lorretta Harp, MD;  Location: [place] [facility];  Service: Cardiovascular;  Laterality: Bilateral;   PORTACATH PLACEMENT Left 05/24/2020   Procedure: INSERTION PORT-A-CATH;  Surgeon: Aviva Signs, MD;  Location: [facility];  Service: General;  Laterality: Left;   TEE WITHOUT CARDIOVERSION N/A 01/24/2016   Procedure: TRANSESOPHAGEAL ECHOCARDIOGRAM (TEE);  Surgeon: Gaye Pollack, MD;  Location: [place];  Service: Open Heart Surgery;  Laterality: N/A;   TONSILLECTOMY     age 75   TUBAL LIGATION       OB History     Gravida  4   Para  4   Term  4   Preterm      AB      Living  4      SAB      IAB  Ectopic      Multiple      Live Births              Family History  Problem Relation Age of Onset   Ovarian cancer Mother 38   Cancer Father 73       unsure of which kind, "it was in his glands"   Hypertension Maternal Grandmother    Stroke Maternal Grandfather    Hypertension Son    Brain cancer Sister    Hypertension Daughter    Heart attack Neg Hx    Colon cancer Neg Hx    Esophageal cancer Neg Hx    Inflammatory bowel disease Neg Hx    Liver disease Neg Hx    Pancreatic cancer Neg Hx    Rectal cancer Neg Hx    Stomach cancer Neg Hx     Social History   Tobacco Use   Smoking status: Former    Packs/day: 1.50    Years: 42.00    Pack years: 63.00    Types: Cigarettes    Quit date: 05/19/1999    Years since quitting: 22.4   Smokeless tobacco: Never  Vaping Use   Vaping Use: Never used  Substance Use Topics   Alcohol use: No   Drug use: No    Home Medications Prior to Admission medications   Medication Sig Start Date End Date  Taking? Authorizing Provider  amLODipine (NORVASC) 5 MG tablet Take 1 tablet (5 mg total) by mouth daily. 08/12/21  Yes Derek Jack, MD  anastrozole (ARIMIDEX) 1 MG tablet TAKE 1 TABLET BY MOUTH ONCE DAILY. 08/17/21  Yes Derek Jack, MD  aspirin EC 81 MG tablet Take 81 mg by mouth every evening.    Yes [provider]  atorvastatin (LIPITOR) 40 MG tablet Take 1 tablet (40 mg total) by mouth daily. Pt needs to make appt with provider for further refills. 1st attempt 05/01/21  Yes Jerline Pain, MD  cefdinir (OMNICEF) 300 MG capsule Take 1 capsule (300 mg total) by mouth daily for 7 days. 10/14/21 10/21/21 Yes Dorie Rank, MD  clopidogrel (PLAVIX) 75 MG tablet TAKE (1) TABLET BY MOUTH ONCE DAILY. Patient taking differently: Take 75 mg by mouth daily. 11/18/20  Yes Jerline Pain, MD  esomeprazole (NEXIUM) 20 MG capsule Take 20 mg by mouth daily.    Yes [provider]  HYDROcodone-acetaminophen (NORCO/VICODIN) 5-325 MG tablet Take 1 tablet by mouth daily.   Yes [provider]  hydrOXYzine (ATARAX/VISTARIL) 25 MG tablet TAKE (1) TABLET BY MOUTH AT BEDTIME AS NEEDED Patient taking differently: Take 25 mg by mouth at bedtime as needed for anxiety or itching. 08/15/21  Yes Derek Jack, MD  levothyroxine (SYNTHROID) 25 MCG tablet Take 25 mcg by mouth daily. 05/20/21  Yes [provider]  metoprolol tartrate (LOPRESSOR) 25 MG tablet Take 1 tablet (25 mg total) by mouth 2 (two) times daily. 11/06/20  Yes Jerline Pain, MD  nitroGLYCERIN (NITROSTAT) 0.4 MG SL tablet Place 1 tablet (0.4 mg total) under the tongue every 5 (five) minutes as needed for chest pain. 10/11/20  Yes Jerline Pain, MD  Pembrolizumab (KEYTRUDA IV) Inject 200 mg into the vein every 21 ( twenty-one) days.    Yes [provider]  pembrolizumab (KEYTRUDA) 100 MG/4ML SOLN See admin instructions. 11/12/20  Yes [provider]  Vitamin D, Ergocalciferol, (DRISDOL)  1.25 MG (50000 UNIT) CAPS capsule TAKE 1 CAPSULE BY MOUTH ONCE A WEEK. 09/22/21  Yes  Derek Jack, MD  amLODipine (NORVASC) 5 MG tablet 1 tablet Patient not taking: Reported on 10/14/2021 09/10/21   [provider]  levothyroxine (SYNTHROID) 50 MCG tablet Take 50 mcg by mouth daily. Patient not taking: Reported on 10/14/2021 09/19/21   [provider]  lidocaine-prilocaine (EMLA) cream Apply to affected area once 05/14/20   Derek Jack, MD  prochlorperazine (COMPAZINE) 10 MG tablet Take 1 tablet (10 mg total) by mouth every 6 (six) hours as needed (Nausea or vomiting). Patient not taking: Reported on 10/14/2021 05/14/20   Derek Jack, MD    Allergies    Otho Darner allergy], Ezetimibe, and Other  Review of Systems   Review of Systems  All other systems reviewed and are negative.  Physical Exam Updated Vital Signs BP 128/83   Pulse 86   Temp 97.8 F (36.6 C) (Oral)   Resp (!) 22   Ht 1.6 m (5\' 3" )   Wt 72.6 kg   LMP  (LMP Unknown)   SpO2 96%   BMI 28.34 kg/m   Physical Exam Vitals and nursing note reviewed.  Constitutional:      General: She is not in acute distress.    Appearance: She is well-developed.  HENT:     Head: Normocephalic and atraumatic.     Right Ear: External ear normal.     Left Ear: External ear normal.  Eyes:     General: No scleral icterus.       Right eye: No discharge.        Left eye: No discharge.     Conjunctiva/sclera: Conjunctivae normal.  Neck:     Trachea: No tracheal deviation.  Cardiovascular:     Rate and Rhythm: Normal rate and regular rhythm.  Pulmonary:     Effort: Pulmonary effort is normal. No respiratory distress.     Breath sounds: Normal breath sounds. No stridor. No wheezing or rales.  Abdominal:     General: Bowel sounds are normal. There is no distension.     Palpations: Abdomen is soft.     Tenderness: There is no abdominal tenderness. There is no guarding or rebound.   Musculoskeletal:        General: No tenderness or deformity.     Cervical back: Neck supple.  Skin:    General: Skin is warm and dry.     Findings: No rash.  Neurological:     General: No focal deficit present.     Mental Status: She is alert.     Cranial Nerves: No cranial nerve deficit (no facial droop, extraocular movements intact, no slurred speech).     Sensory: No sensory deficit.     Motor: No abnormal muscle tone or seizure activity.     Coordination: Coordination normal.  Psychiatric:        Mood and Affect: Mood normal.    ED Results / Procedures / Treatments   Labs (all labs ordered are listed, but only abnormal results are displayed) Labs Reviewed  URINALYSIS, ROUTINE W REFLEX MICROSCOPIC - Abnormal; Notable for the following components:      Result Value   Color, Urine AMBER (*)    APPearance CLOUDY (*)    Glucose, UA 50 (*)    Hgb urine dipstick LARGE (*)    Protein, ur 100 (*)    Leukocytes,Ua SMALL (*)    RBC / HPF >50 (*)    WBC, UA >50 (*)    Bacteria, UA FEW (*)    Non Squamous  Epithelial 0-5 (*)    All other components within normal limits  CBC WITH DIFFERENTIAL/PLATELET - Abnormal; Notable for the following components:   RBC 5.19 (*)    Hemoglobin 15.1 (*)    HCT 47.8 (*)    All other components within normal limits  COMPREHENSIVE METABOLIC PANEL - Abnormal; Notable for the following components:   Glucose, Bld 113 (*)    Creatinine, Ser 1.55 (*)    GFR, Estimated 33 (*)    All other components within normal limits    EKG EKG Interpretation  Date/Time:  Tuesday October 14 2021 11:13:35 EST Ventricular Rate:  99 PR Interval:  168 QRS Duration: 136 QT Interval:  400 QTC Calculation: 513 R Axis:   -6 Text Interpretation: Normal sinus rhythm Non-specific intra-ventricular conduction block Minimal voltage criteria for LVH, may be normal variant ( Cornell product ) T wave abnormality, consider lateral ischemia Abnormal ECG No significant change  since last tracing Confirmed by Dorie Rank [phone]) on 10/14/2021 3:29:19 PM  Radiology DG Chest 2 View  Result Date: 10/14/2021 CLINICAL DATA:  Rapid heart rate.  On chemotherapy for colon cancer. EXAM: CHEST - 2 VIEW COMPARISON:  CT chest dated August 08, 2021. Chest x-ray dated May 24, 2020. FINDINGS: Unchanged left chest wall port catheter. The heart size and mediastinal contours are within normal limits. Prior CABG. Normal pulmonary vascularity. No focal consolidation, pleural effusion, or pneumothorax. No acute osseous abnormality. IMPRESSION: 1. No acute cardiopulmonary disease. Electronically Signed   By: Titus Dubin M.D.   On: 10/14/2021 15:51    Procedures Procedures   Medications Ordered in ED Medications  cefdinir (OMNICEF) capsule 300 mg (has no administration in time range)    ED Course  I have reviewed the triage vital signs and the nursing notes.  Pertinent labs & imaging results that were available during my care of the patient were reviewed by me and considered in my medical decision making (see chart for details).  Clinical Course as of 10/14/21 1851  Tue Oct 14, 2021  1726 X-ray without acute findings [JK]  4580 CBC and metabolic panel unremarkable.  Slight elevation in creatinine but this is stable.  Hemoglobin is actually increased [JK]  1828 Urinalysis does suggest UTI. [JK]  [place] x-ray without acute findings. [JK]    Clinical Course User Index [JK] Dorie Rank, MD   MDM Rules/Calculators/A&P                           Patient presented to the ED for evaluation of palpitations in the setting of recent hematuria patient was concerned that she was anemic.  Laboratory tests however show that her hemoglobin is stable.  She has no evidence of dehydration.  Patient is afebrile and nontoxic.  She has no dysrhythmia and in the ED she has not been tachycardic or hypotensive.  No findings to suggest sepsis or anemia or other acute concerning issues.  Patient  does have evidence of a urinary tract infection.  She has noticed blood in her urine recently.  We will have her start a course of antibiotics.  Close outpatient follow-up. Final Clinical Impression(s) / ED Diagnoses Final diagnoses:  Acute cystitis with hematuria    Rx / DC Orders ED Discharge Orders          Ordered    cefdinir (OMNICEF) 300 MG capsule  Daily        10/14/21 1850  Dorie Rank, MD 10/14/21 [phone]

## 2021-10-14 NOTE — Discharge Instructions (Signed)
Take the antibiotics as prescribed.  Follow-up with your doctor to be rechecked.  Return as needed for fevers chills worsening symptoms. 

## 2021-10-14 NOTE — Telephone Encounter (Signed)
Patient called stating that she had been having gross blood loss in her urine for 4 days.  Stopped her blood thinner 2 days ago and it has subsided, however she is feeling like her hgb may be low and she has been hypotensive.  Advised her to go to the ER to be evaluated with all that is involved.  Verbalized understanding.

## 2021-10-14 NOTE — ED Notes (Signed)
Up to br

## 2021-10-14 NOTE — ED Triage Notes (Signed)
Pt states HR is fast.  Noted blood in urine and worse over past few days.  Pt with hx of same.

## 2021-10-14 NOTE — ED Provider Notes (Signed)
Emergency Medicine Provider Triage Evaluation Note  Jessica Taylor , a 80 y.o. female  was evaluated in triage.  Pt complains of blood in her urine.  Pt stopped all of her medications.  Pt is suppose to be on a blood thinner  Review of Systems  Positive:  Negative: No fever or chills,  no chest pain  Physical Exam  BP (!) 141/89 (BP Location: Right Arm)   Pulse 100   Temp 97.8 F (36.6 C) (Oral)   Resp (!) 22   Ht 5\' 3"  (1.6 m)   Wt 72.6 kg   LMP  (LMP Unknown)   SpO2 99%   BMI 28.34 kg/m  Gen:   Awake, no distress   Resp:  Normal effort  MSK:   Moves extremities without difficulty  Other:    Medical Decision Making  Medically screening exam initiated at 12:09 PM.  Appropriate orders placed.  Jessica Taylor was informed that the remainder of the evaluation will be completed by another provider, this initial triage assessment does not replace that evaluation, and the importance of remaining in the ED until their evaluation is complete.     Fransico Meadow, PA-C 10/14/21 1210    Wyvonnia Dusky, MD 10/14/21 [phone]

## 2021-10-21 ENCOUNTER — Other Ambulatory Visit (HOSPITAL_COMMUNITY): Payer: Self-pay | Admitting: Cardiology

## 2021-10-21 DIAGNOSIS — I739 Peripheral vascular disease, unspecified: Secondary | ICD-10-CM

## 2021-10-27 NOTE — Progress Notes (Signed)
Jessica Taylor, [place] [address]   CLINIC:  Medical Oncology/Hematology  PCP:  Antony Contras, MD [address] / [place] [address] [phone]   REASON FOR VISIT:  Follow-up for stage I right breast cancer and colon cancer  PRIOR THERAPY:  1. Robotic proximal colectomy on 10/11/2019. 2. Right breast lumpectomy and SLNB on 04/02/2020  NGS Results: MSI--high, Foundation 1 not sent  CURRENT THERAPY: Keytruda every 3 weeks  BRIEF ONCOLOGIC HISTORY:  Oncology History  Cancer of ascending colon s/p robotic proximal colectomy 10/11/2019  10/11/2019 Initial Diagnosis   Cancer of ascending colon s/p robotic proximal colectomy 10/11/2019   10/23/2019 Cancer Staging   Staging form: Colon and Rectum, AJCC 8th Edition - Clinical stage from 10/23/2019: Stage IVA (cT3, cN1c, cM1a) - Signed by Derek Jack, MD on 12/14/2019    05/14/2020 -  Chemotherapy   Patient is on Treatment Plan : COLORECTAL Pembrolizumab q21d     Malignant neoplasm of right female breast ([place])  04/23/2020 Initial Diagnosis   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 [facility])   04/23/2020 Cancer Staging   Staging form: Breast, AJCC 8th Edition - Clinical stage from 04/23/2020: Stage IA (cT1c, cN0(sn), cM0, G2, ER+, PR+, HER2-) - Signed by Derek Jack, MD on 04/23/2020      CANCER STAGING: Cancer Staging  Cancer of ascending colon s/p robotic proximal colectomy 10/11/2019 Staging form: Colon and Rectum, AJCC 8th Edition - Clinical stage from 10/23/2019: Stage IVA (cT3, cN1c, cM1a) - Signed by Derek Jack, MD on 12/14/2019  Malignant neoplasm of right female breast [facility]) Staging form: Breast, AJCC 8th Edition - Clinical stage from 04/23/2020: Stage IA (cT1c, cN0(sn), cM0, G2, ER+, PR+, HER2-) - Signed by Derek Jack, MD on 04/23/2020   INTERVAL HISTORY:  Jessica Taylor, a 80 y.o. female, returns for routine  follow-up of her stage I right breast cancer and colon cancer. Jessica Taylor was last seen on 10/25/20222.   Today she reports feeling good. She denies diarrhea, dysuria, hematuria, nausea, vomiting, and cough. She reports occasional blistering, scabbed rash.   REVIEW OF SYSTEMS:  Review of Systems  Constitutional:  Negative for appetite change (90%) and fatigue (50%).  Respiratory:  Negative for cough.   Gastrointestinal:  Negative for diarrhea, nausea and vomiting.  Genitourinary:  Negative for dysuria and hematuria.   Skin:  Positive for rash (occasional).  All other systems reviewed and are negative.  PAST MEDICAL/SURGICAL HISTORY:  Past Medical History:  Diagnosis Date   Abdominal aortic aneurysm    Acid reflux    Anemia    Arthritis    knees , R shoulder - tx /w injection - 11/2014   Basal cell carcinoma (BCC) of dorsum of nose 2010   Resolved   Cancer ([place])    basal cell on nose   Colon cancer (H[facility])    Coronary artery disease    Hypercholesteremia    Hypertension    Hypothyroidism    Lt Acute pyelonephritis 09/11/2018   MI, old 2000   Peripheral arterial disease ([place])    hhigh-grade ostial bilateral calcified iliac stenosis with claudication   Tobacco abuse    Vertigo    when lays on left side.   Past Surgical History:  Procedure Laterality Date   BREAST LUMPECTOMY WITH RADIOACTIVE SEED AND SENTINEL LYMPH NODE BIOPSY Bilateral 04/02/2020   Procedure: BILATERAL BREAST LUMPECTOMY WITH RADIOACTIVE SEED AND RIGHT SENTINEL LYMPH NODE BIOPSY AND RIGHT  TARGETED AXILLARY LYMPH NODE BIOPSY;  Surgeon: Erroll Luna, MD;  Location: [place];  Service: General;  Laterality: Bilateral;   CARDIAC CATHETERIZATION  5 stents   CARDIAC CATHETERIZATION N/A 01/20/2016   Procedure: Left Heart Cath and Coronary Angiography;  Surgeon: Peter M Martinique, MD;  Location: [place] CV [facility];  Service: Cardiovascular;  Laterality: N/A;   CATARACT EXTRACTION W/PHACO Left 05/24/2014    Procedure: CATARACT EXTRACTION PHACO AND INTRAOCULAR LENS PLACEMENT (IOC);  Surgeon: Tonny Branch, MD;  Location: [facility];  Service: Ophthalmology;  Laterality: Left;  CDE:  9.30   CATARACT EXTRACTION W/PHACO Right 06/18/2014   Procedure: CATARACT EXTRACTION PHACO AND INTRAOCULAR LENS PLACEMENT RIGHT EYE CDE=10.84;  Surgeon: Tonny Branch, MD;  Location: [facility];  Service: Ophthalmology;  Laterality: Right;   CORONARY ARTERY BYPASS GRAFT N/A 01/24/2016   Procedure: CORONARY ARTERY BYPASS GRAFTING (CABG);  Surgeon: Gaye Pollack, MD;  Location: [place];  Service: Open Heart Surgery;  Laterality: N/A;   CORONARY STENT PLACEMENT     CORONARY STENT PLACEMENT  03/06/15   CFX DES   CYSTOSCOPY/URETEROSCOPY/HOLMIUM LASER/STENT PLACEMENT Left 09/12/2018   Procedure: CYSTOSCOPY/URETEROSCOPY/STENT PLACEMENT;  Surgeon: Ceasar Mons, MD;  Location: [facility];  Service: Urology;  Laterality: Left;   EYE SURGERY     LEFT HEART CATH AND CORS/GRAFTS ANGIOGRAPHY N/A 09/12/2019   Procedure: LEFT HEART CATH AND CORS/GRAFTS ANGIOGRAPHY;  Surgeon: Troy Sine, MD;  Location: [place] [facility];  Service: Cardiovascular;  Laterality: N/A;   LEFT HEART CATHETERIZATION WITH CORONARY ANGIOGRAM N/A 03/06/2015   Procedure: LEFT HEART CATHETERIZATION WITH CORONARY ANGIOGRAM;  Surgeon: Lorretta Harp, MD;  Location: [facility] C[facility];  Service: Cardiovascular;  Laterality: N/A;   PARTIAL KNEE ARTHROPLASTY Right 02/04/2015   Procedure: UNICOMPARTMENTAL KNEE;  Surgeon: Dorna Leitz, MD;  Location: [place];  Service: Orthopedics;  Laterality: Right;   PERIPHERAL VASCULAR CATHETERIZATION Bilateral 05/13/2015   Procedure: Lower Extremity Angiography;  Surgeon: Lorretta Harp, MD;  Location: [place] [facility];  Service: Cardiovascular;  Laterality: Bilateral;   PERIPHERAL VASCULAR CATHETERIZATION N/A 05/13/2015   Procedure: Abdominal Aortogram;  Surgeon: Lorretta Harp, MD;  Location: [place] [facility];  Service: Cardiovascular;   Laterality: N/A;   PERIPHERAL VASCULAR CATHETERIZATION Bilateral 06/27/2015   Procedure: Peripheral Vascular Intervention;  Surgeon: Lorretta Harp, MD;  Location: [place] [facility];  Service: Cardiovascular;  Laterality: Bilateral;  ILIACS   PERIPHERAL VASCULAR CATHETERIZATION Bilateral 06/27/2015   Procedure: Peripheral Vascular Atherectomy;  Surgeon: Lorretta Harp, MD;  Location: [place] [facility];  Service: Cardiovascular;  Laterality: Bilateral;   PORTACATH PLACEMENT Left 05/24/2020   Procedure: INSERTION PORT-A-CATH;  Surgeon: Aviva Signs, MD;  [facility];  Service: General;  Laterality: Left;   TEE WITHOUT CARDIOVERSION N/A 01/24/2016   Procedure: TRANSESOPHAGEAL ECHOCARDIOGRAM (TEE);  Surgeon: Gaye Pollack, MD;  Location: [place];  Service: Open Heart Surgery;  Laterality: N/A;   TONSILLECTOMY     age 24   TUBAL LIGATION      SOCIAL HISTORY:  Social History   Socioeconomic History   Marital status: Married    Spouse name: Tommy   Number of children: 4   Years of education: Not on file   Highest education level: Not on file  Occupational History   Occupation: retired    Comment: worked as Presenter, broadcasting for [employer]  Tobacco Use   Smoking status: Former    Packs/day: 1.50    Years: 42.00  Pack years: 63.00    Types: Cigarettes    Quit date: 05/19/1999    Years since quitting: 22.4   Smokeless tobacco: Never  Vaping Use   Vaping Use: Never used  Substance and Sexual Activity   Alcohol use: No   Drug use: No   Sexual activity: Yes    Birth control/protection: Surgical  Other Topics Concern   Not on file  Social History Narrative   Not on file   Social Determinants of Health   Financial Resource Strain: Medium Risk   Difficulty of Paying Living Expenses: Somewhat hard  Food Insecurity: No Food Insecurity   Worried About Charity fundraiser in the Last Year: Never true   Ran Out of Food in the Last Year: Never true  Transportation Needs: No  Transportation Needs   Lack of Transportation (Medical): No   Lack of Transportation (Non-Medical): No  Physical Activity: Inactive   Days of Exercise per Week: 0 days   Minutes of Exercise per Session: 0 min  Stress: No Stress Concern Present   Feeling of Stress : Not at all  Social Connections: Moderately Isolated   Frequency of Communication with Friends and Family: More than three times a week   Frequency of Social Gatherings with Friends and Family: Three times a week   Attends Religious Services: Never   Active Member of Clubs or Organizations: No   Attends Archivist Meetings: Never   Marital Status: Married  Human resources officer Violence: Not At Risk   Fear of Current or Ex-Partner: No   Emotionally Abused: No   Physically Abused: No   Sexually Abused: No    FAMILY HISTORY:  Family History  Problem Relation Age of Onset   Ovarian cancer Mother 21   Cancer Father 39       unsure of which kind, "it was in his glands"   Hypertension Maternal Grandmother    Stroke Maternal Grandfather    Hypertension Son    Brain cancer Sister    Hypertension Daughter    Heart attack Neg Hx    Colon cancer Neg Hx    Esophageal cancer Neg Hx    Inflammatory bowel disease Neg Hx    Liver disease Neg Hx    Pancreatic cancer Neg Hx    Rectal cancer Neg Hx    Stomach cancer Neg Hx     CURRENT MEDICATIONS:  Current Outpatient Medications  Medication Sig Dispense Refill   amLODipine (NORVASC) 5 MG tablet Take 1 tablet (5 mg total) by mouth daily. 30 tablet 3   amLODipine (NORVASC) 5 MG tablet 1 tablet (Patient not taking: Reported on 10/14/2021)     anastrozole (ARIMIDEX) 1 MG tablet TAKE 1 TABLET BY MOUTH ONCE DAILY. 30 tablet 6   aspirin EC 81 MG tablet Take 81 mg by mouth every evening.      atorvastatin (LIPITOR) 40 MG tablet Take 1 tablet (40 mg total) by mouth daily. Pt needs to make appt with provider for further refills. 1st attempt 90 tablet 1   clopidogrel (PLAVIX) 75  MG tablet TAKE (1) TABLET BY MOUTH ONCE DAILY. (Patient taking differently: Take 75 mg by mouth daily.) 90 tablet 3   esomeprazole (NEXIUM) 20 MG capsule Take 20 mg by mouth daily.      HYDROcodone-acetaminophen (NORCO/VICODIN) 5-325 MG tablet Take 1 tablet by mouth daily.     hydrOXYzine (ATARAX/VISTARIL) 25 MG tablet TAKE (1) TABLET BY MOUTH AT BEDTIME AS NEEDED (Patient taking differently:  Take 25 mg by mouth at bedtime as needed for anxiety or itching.) 30 tablet 6   levothyroxine (SYNTHROID) 25 MCG tablet Take 25 mcg by mouth daily.     levothyroxine (SYNTHROID) 50 MCG tablet Take 50 mcg by mouth daily. (Patient not taking: Reported on 10/14/2021)     lidocaine-prilocaine (EMLA) cream Apply to affected area once 30 g 3   metoprolol tartrate (LOPRESSOR) 25 MG tablet Take 1 tablet (25 mg total) by mouth 2 (two) times daily. 180 tablet 3   nitroGLYCERIN (NITROSTAT) 0.4 MG SL tablet Place 1 tablet (0.4 mg total) under the tongue every 5 (five) minutes as needed for chest pain. 25 tablet prn   Pembrolizumab (KEYTRUDA IV) Inject 200 mg into the vein every 21 ( twenty-one) days.      pembrolizumab (KEYTRUDA) 100 MG/4ML SOLN See admin instructions.     prochlorperazine (COMPAZINE) 10 MG tablet Take 1 tablet (10 mg total) by mouth every 6 (six) hours as needed (Nausea or vomiting). (Patient not taking: Reported on 10/14/2021) 30 tablet 1   Vitamin D, Ergocalciferol, (DRISDOL) 1.25 MG (50000 UNIT) CAPS capsule TAKE 1 CAPSULE BY MOUTH ONCE A WEEK. 4 capsule 3   No current facility-administered medications for this visit.    ALLERGIES:  Allergies  Allergen Reactions   Crab [Shellfish Allergy] Nausea And Vomiting    Throws up violently   Ezetimibe Diarrhea   Other Nausea And Vomiting    Shrimp    PHYSICAL EXAM:  Performance status (ECOG): 1 - Symptomatic but completely ambulatory  There were no vitals filed for this visit. Wt Readings from Last 3 Encounters:  10/14/21 160 lb (72.6 kg)   09/16/21 162 lb 4.1 oz (73.6 kg)  08/26/21 160 lb 12.8 oz (72.9 kg)   Physical Exam Vitals reviewed.  Constitutional:      Appearance: Normal appearance.  Cardiovascular:     Rate and Rhythm: Normal rate and regular rhythm.     Pulses: Normal pulses.     Heart sounds: Normal heart sounds.  Pulmonary:     Effort: Pulmonary effort is normal.     Breath sounds: Normal breath sounds.  Lymphadenopathy:     Upper Body:     Right upper body: No supraclavicular, axillary or pectoral adenopathy.     Left upper body: No supraclavicular, axillary or pectoral adenopathy.  Skin:    General: Skin is dry.     Findings: Erythema (L lower posterior thigh) and rash (L lower posterior thigh) present. Rash is crusting.  Neurological:     General: No focal deficit present.     Mental Status: She is alert and oriented to person, place, and time.  Psychiatric:        Mood and Affect: Mood normal.        Behavior: Behavior normal.     LABORATORY DATA:  I have reviewed the labs as listed.  CBC Latest Ref Rng & Units 10/14/2021 10/07/2021 09/16/2021  WBC 4.0 - 10.5 K/uL 7.3 8.8 5.5  Hemoglobin 12.0 - 15.0 g/dL 15.1(H) 13.3 13.2  Hematocrit 36.0 - 46.0 % 47.8(H) 41.6 41.2  Platelets 150 - 400 K/uL 232 196 169   CMP Latest Ref Rng & Units 10/14/2021 10/07/2021 09/16/2021  Glucose 70 - 99 mg/dL 113(H) 104(H) 114(H)  BUN 8 - 23 mg/dL _0 Creatinine 0.44 - 1.00 mg/dL 1.55(H) 1.82(H) 1.42(H)  Sodium 135 - 145 mmol/L 138 137 140  Potassium 3.5 - 5.1 mmol/L 3.7 3.8 3.7  Chloride 98 - 111 mmol/L 104 105 106  CO2 22 - 32 mmol/L _0 Calcium 8.9 - 10.3 mg/dL 10.1 9.3 9.3  Total Protein 6.5 - 8.1 g/dL 7.7 6.8 6.5  Total Bilirubin 0.3 - 1.2 mg/dL 1.2 0.9 0.9  Alkaline Phos 38 - 126 U/L 100 86 85  AST 15 - 41 U/L _1 ALT 0 - 44 U/L _2 DIAGNOSTIC IMAGING:  I have independently reviewed the scans and discussed with the patient. DG Chest 2 View  Result Date:  10/14/2021 CLINICAL DATA:  Rapid heart rate.  On chemotherapy for colon cancer. EXAM: CHEST - 2 VIEW COMPARISON:  CT chest dated August 08, 2021. Chest x-ray dated May 24, 2020. FINDINGS: Unchanged left chest wall port catheter. The heart size and mediastinal contours are within normal limits. Prior CABG. Normal pulmonary vascularity. No focal consolidation, pleural effusion, or pneumothorax. No acute osseous abnormality. IMPRESSION: 1. No acute cardiopulmonary disease. Electronically Signed   By: Titus Dubin M.D.   On: 10/14/2021 15:51     ASSESSMENT:  1.  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  Right hemicolectomy on 10/11/2019, grade 3 adenocarcinoma, negative margins, positive LVSI, 2 tumor deposits, 0/15 lymph nodes positive, PT3PN1C. 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  MLH1 hyper methylation present. -PET scan in December 2020 showed lymph node in the right axillary region.  Biopsy consistent with metastatic colon cancer. -Last CEA was 10.7 on 11/22/2019. -Right axillary lymph node excision on 04/02/2020 consistent with metastatic carcinoma from colon cancer. -Pembrolizumab started on 05/14/2020. -PET scan on 08/05/2020 shows complete resolution of lymphadenopathy.  No evidence of new areas of uptake.  Mild vague residual hypermetabolism in the right axilla without soft tissue mass. -PET scan on 12/23/2020 with no evidence of recurrence.   2.  Stage I (PT1CPN0) right Breast, Grade 2 Invasive Lobular Carcinoma: -MRI of the breast on 12/25/2019 showed 1 cm mass behind the right areola with a suspicious mass in the left breast. -Right breast lumpectomy on 04/02/2020 shows invasive lobular carcinoma, grade 2, 1.2 cm.  Resection margins are negative.  Negative LVSI.  2 sentinel lymph nodes were negative for carcinoma.  ER/PR 100% positive, HER-2 negative, Ki-67 10%.   PLAN:  1.  Stage IV  colon cancer to the right axillary lymph node: - CT CAP on 08/08/2021 showed no evidence of recurrence in the chest, abdomen or pelvis. - Clinically she does not have any lymphadenopathy Region. - She denies any diarrhea. - Reviewed labs today which showed normal LFTs and CBC.  TSH was 0.4.  Last CEA was 9. - Continue Keytruda today and in 3 weeks.  RTC 6 weeks for follow-up. - Plan to repeat scans in March 2023.   2.  Right breast invasive lobular carcinoma, grade 2: - Continue anastrozole daily.  No side effects.   3.  Osteoporosis: - Prior DEXA scan showed osteoporosis. - Continue vitamin D 50,000 units daily.   4.  Bladder cancer: - She does not report any further hematuria. - Continue follow-[facility] with [facility]rology at [facility].   5.  Elevated creatinine: - Baseline creatinine 1.4-1.7.  Today creatinine 1.61.   6.  Generalized pruritus: - Continue moisturizing lotion and Atarax as needed.   Orders placed this encounter:  No orders of the defined types were placed in this encounter.    Derek Jack,  MD [place] [phone]   I, Thana Ates, am acting as a scribe for Dr. Derek Jack.  I, Derek Jack MD, have reviewed the above documentation for accuracy and completeness, and I agree with the above.

## 2021-10-28 ENCOUNTER — Inpatient Hospital Stay (HOSPITAL_COMMUNITY): Payer: Medicare Other | Attending: Hematology

## 2021-10-28 ENCOUNTER — Inpatient Hospital Stay (HOSPITAL_BASED_OUTPATIENT_CLINIC_OR_DEPARTMENT_OTHER): Payer: Medicare Other | Admitting: Hematology

## 2021-10-28 ENCOUNTER — Other Ambulatory Visit: Payer: Self-pay

## 2021-10-28 ENCOUNTER — Inpatient Hospital Stay (HOSPITAL_COMMUNITY): Payer: Medicare Other

## 2021-10-28 VITALS — BP 130/60 | HR 53 | Temp 97.4°F | Resp 19 | Ht 62.0 in | Wt 160.2 lb

## 2021-10-28 VITALS — BP 166/69 | HR 64 | Temp 97.7°F | Resp 18

## 2021-10-28 DIAGNOSIS — Z79899 Other long term (current) drug therapy: Secondary | ICD-10-CM | POA: Insufficient documentation

## 2021-10-28 DIAGNOSIS — C50911 Malignant neoplasm of unspecified site of right female breast: Secondary | ICD-10-CM

## 2021-10-28 DIAGNOSIS — C182 Malignant neoplasm of ascending colon: Secondary | ICD-10-CM | POA: Insufficient documentation

## 2021-10-28 DIAGNOSIS — Z170 Estrogen receptor positive status [ER+]: Secondary | ICD-10-CM | POA: Diagnosis not present

## 2021-10-28 DIAGNOSIS — Z5112 Encounter for antineoplastic immunotherapy: Secondary | ICD-10-CM | POA: Diagnosis not present

## 2021-10-28 LAB — CBC WITH DIFFERENTIAL/PLATELET
Abs Immature Granulocytes: 0.01 10*3/uL (ref 0.00–0.07)
Basophils Absolute: 0.1 10*3/uL (ref 0.0–0.1)
Basophils Relative: 1 %
Eosinophils Absolute: 0.1 10*3/uL (ref 0.0–0.5)
Eosinophils Relative: 2 %
HCT: 40.2 % (ref 36.0–46.0)
Hemoglobin: 12.6 g/dL (ref 12.0–15.0)
Immature Granulocytes: 0 %
Lymphocytes Relative: 20 %
Lymphs Abs: 1.1 10*3/uL (ref 0.7–4.0)
MCH: 28.7 pg (ref 26.0–34.0)
MCHC: 31.3 g/dL (ref 30.0–36.0)
MCV: 91.6 fL (ref 80.0–100.0)
Monocytes Absolute: 0.4 10*3/uL (ref 0.1–1.0)
Monocytes Relative: 7 %
Neutro Abs: 4 10*3/uL (ref 1.7–7.7)
Neutrophils Relative %: 70 %
Platelets: 210 10*3/uL (ref 150–400)
RBC: 4.39 MIL/uL (ref 3.87–5.11)
RDW: 14.1 % (ref 11.5–15.5)
WBC: 5.7 10*3/uL (ref 4.0–10.5)
nRBC: 0 % (ref 0.0–0.2)

## 2021-10-28 LAB — COMPREHENSIVE METABOLIC PANEL
ALT: 13 U/L (ref 0–44)
AST: 17 U/L (ref 15–41)
Albumin: 3.7 g/dL (ref 3.5–5.0)
Alkaline Phosphatase: 88 U/L (ref 38–126)
Anion gap: 8 (ref 5–15)
BUN: 19 mg/dL (ref 8–23)
CO2: 26 mmol/L (ref 22–32)
Calcium: 9.2 mg/dL (ref 8.9–10.3)
Chloride: 107 mmol/L (ref 98–111)
Creatinine, Ser: 1.61 mg/dL — ABNORMAL HIGH (ref 0.44–1.00)
GFR, Estimated: 32 mL/min — ABNORMAL LOW (ref >60)
Glucose, Bld: 101 mg/dL — ABNORMAL HIGH (ref 70–99)
Potassium: 3.6 mmol/L (ref 3.5–5.1)
Sodium: 141 mmol/L (ref 135–145)
Total Bilirubin: 0.3 mg/dL (ref 0.3–1.2)
Total Protein: 6.4 g/dL — ABNORMAL LOW (ref 6.5–8.1)

## 2021-10-28 LAB — TSH: TSH: 0.417 u[IU]/mL (ref 0.350–4.500)

## 2021-10-28 MED ORDER — HEPARIN SOD (PORK) LOCK FLUSH 100 UNIT/ML IV SOLN
500.0000 [IU] | Freq: Once | INTRAVENOUS | Status: AC | PRN
  Administered 2021-10-28: 500 [IU]

## 2021-10-28 MED ORDER — SODIUM CHLORIDE 0.9 % IV SOLN: Freq: Once | INTRAVENOUS | Status: AC

## 2021-10-28 MED ORDER — SODIUM CHLORIDE 0.9 % IV SOLN
200.0000 mg | Freq: Once | INTRAVENOUS | Status: AC
  Administered 2021-10-28: 200 mg via INTRAVENOUS
  Filled 2021-10-28: qty 8

## 2021-10-28 MED ORDER — SODIUM CHLORIDE 0.9% FLUSH
10.0000 mL | INTRAVENOUS | Status: DC | PRN
  Administered 2021-10-28: 10 mL

## 2021-10-28 NOTE — Progress Notes (Signed)
Patient presents today for Keytruda infusion.  Patient is in satisfactory condition with no new complaints voiced.  Vital signs are stable.  Labs reviewed by Dr. Delton Coombes during her office visit.  All labs are within treatment parameters.  We will proceed with treatment per MD orders.   Patient tolerated treatment well with no complaints voiced.  Patient left ambulatory in stable condition.  Vital signs stable at discharge.  Follow up as scheduled.

## 2021-10-28 NOTE — Progress Notes (Signed)
Patient has been assessed, vital signs and labs have been reviewed by Dr. Katragadda. ANC, Creatinine, LFTs, and Platelets are within treatment parameters per Dr. Katragadda. The patient is good to proceed with treatment at this time. Primary RN and pharmacy aware.  

## 2021-10-28 NOTE — Patient Instructions (Signed)
[facility]  Discharge Instructions: Thank you for choosing [facility] to provide your oncology and hematology care.  If you have a lab appointment with the Cancer Center, please come in thru the Main Entrance and check in at the main information desk.  Wear comfortable clothing and clothing appropriate for easy access to any Portacath or PICC line.   We strive to give you quality time with your provider. You may need to reschedule your appointment if you arrive late (15 or more minutes).  Arriving late affects you and other patients whose appointments are after yours.  Also, if you miss three or more appointments without notifying the office, you may be dismissed from the clinic at the provider's discretion.      For prescription refill requests, have your pharmacy contact our office and allow 72 hours for refills to be completed.        To help prevent nausea and vomiting after your treatment, we encourage you to take your nausea medication as directed.  BELOW ARE SYMPTOMS THAT SHOULD BE REPORTED IMMEDIATELY: *FEVER GREATER THAN 100.4 F (38 C) OR HIGHER *CHILLS OR SWEATING *NAUSEA AND VOMITING THAT IS NOT CONTROLLED WITH YOUR NAUSEA MEDICATION *UNUSUAL SHORTNESS OF BREATH *UNUSUAL BRUISING OR BLEEDING *URINARY PROBLEMS (pain or burning when urinating, or frequent urination) *BOWEL PROBLEMS (unusual diarrhea, constipation, pain near the anus) TENDERNESS IN MOUTH AND THROAT WITH OR WITHOUT PRESENCE OF ULCERS (sore throat, sores in mouth, or a toothache) UNUSUAL RASH, SWELLING OR PAIN  UNUSUAL VAGINAL DISCHARGE OR ITCHING   Items with * indicate a potential emergency and should be followed up as soon as possible or go to the Emergency Department if any problems should occur.  Please show the CHEMOTHERAPY ALERT CARD or IMMUNOTHERAPY ALERT CARD at check-in to the Emergency Department and triage nurse.  Should you have questions after your visit or need to cancel  or reschedule your appointment, please contact [facility] [phone]  and follow the prompts.  Office hours are 8:00 a.m. to 4:30 p.m. Monday - Friday. Please note that voicemails left after 4:00 p.m. may not be returned until the following business day.  We are closed weekends and major holidays. You have access to a nurse at all times for urgent questions. Please call the main number to the clinic [phone] and follow the prompts.  For any non-urgent questions, you may also contact your provider using MyChart. We now offer e-Visits for anyone 18 and older to request care online for non-urgent symptoms. For details visit [email or web address].   Also download the MyChart app! Go to the app store, search "MyChart", open the app, select [facility], and log in with your MyChart username and password.  Due to Covid, a mask is required upon entering the hospital/clinic. If you do not have a mask, one will be given to you upon arrival. For doctor visits, patients may have 1 support person aged 18 or older with them. For treatment visits, patients cannot have anyone with them due to current Covid guidelines and our immunocompromised population.  

## 2021-10-28 NOTE — Patient Instructions (Signed)
[facility] at [facility] Discharge Instructions  You were seen today by Dr. Katragadda. He went over your recent results, and you received your treatment. Dr. Katragadda will see you back in 6 weeks for labs and follow up.   Thank you for choosing [facility] at [facility] to provide your oncology and hematology care.  To afford each patient quality time with our provider, please arrive at least 15 minutes before your scheduled appointment time.   If you have a lab appointment with the Cancer Center please come in thru the Main Entrance and check in at the main information desk  You need to re-schedule your appointment should you arrive 10 or more minutes late.  We strive to give you quality time with our providers, and arriving late affects you and other patients whose appointments are after yours.  Also, if you no show three or more times for appointments you may be dismissed from the clinic at the providers discretion.     Again, thank you for choosing [facility].  Our hope is that these requests will decrease the amount of time that you wait before being seen by our physicians.       _____________________________________________________________  Should you have questions after your visit to  Cancer Center, please contact our office at [phone] between the hours of 8:00 a.m. and 4:30 p.m.  Voicemails left after 4:00 p.m. will not be returned until the following business day.  For prescription refill requests, have your pharmacy contact our office and allow 72 hours.    Cancer Center Support Programs:   > Cancer Support Group  2nd Tuesday of the month 1pm-2pm, Journey Room   

## 2021-10-29 ENCOUNTER — Encounter (HOSPITAL_COMMUNITY): Payer: Medicare Other

## 2021-11-03 ENCOUNTER — Ambulatory Visit (HOSPITAL_BASED_OUTPATIENT_CLINIC_OR_DEPARTMENT_OTHER)
Admission: RE | Admit: 2021-11-03 | Discharge: 2021-11-03 | Disposition: A | Discharge status: Home / Self Care | Payer: Medicare Other | Source: Ambulatory Visit | Attending: Cardiology | Admitting: Cardiology

## 2021-11-03 ENCOUNTER — Encounter (HOSPITAL_COMMUNITY): Payer: Medicare Other

## 2021-11-03 ENCOUNTER — Ambulatory Visit (HOSPITAL_COMMUNITY)
Admission: RE | Admit: 2021-11-03 | Discharge: 2021-11-03 | Disposition: A | Discharge status: Home / Self Care | Payer: Medicare Other | Source: Ambulatory Visit | Attending: Cardiology | Admitting: Cardiology

## 2021-11-03 ENCOUNTER — Other Ambulatory Visit: Payer: Self-pay

## 2021-11-03 DIAGNOSIS — I739 Peripheral vascular disease, unspecified: Secondary | ICD-10-CM | POA: Insufficient documentation

## 2021-11-03 DIAGNOSIS — Z95828 Presence of other vascular implants and grafts: Secondary | ICD-10-CM | POA: Diagnosis not present

## 2021-11-03 DIAGNOSIS — I7409 Other arterial embolism and thrombosis of abdominal aorta: Secondary | ICD-10-CM | POA: Insufficient documentation

## 2021-11-07 ENCOUNTER — Encounter: Payer: Self-pay | Admitting: *Deleted

## 2021-11-08 ENCOUNTER — Other Ambulatory Visit: Payer: Self-pay

## 2021-11-11 ENCOUNTER — Other Ambulatory Visit: Payer: Self-pay | Admitting: Cardiology

## 2021-11-11 ENCOUNTER — Other Ambulatory Visit (HOSPITAL_COMMUNITY): Payer: Self-pay | Admitting: Hematology

## 2021-11-18 ENCOUNTER — Inpatient Hospital Stay (HOSPITAL_COMMUNITY): Payer: Medicare Other

## 2021-11-18 ENCOUNTER — Encounter (HOSPITAL_COMMUNITY): Payer: Self-pay

## 2021-11-18 ENCOUNTER — Other Ambulatory Visit: Payer: Self-pay

## 2021-11-18 VITALS — BP 163/79 | HR 79 | Temp 97.3°F | Resp 18

## 2021-11-18 DIAGNOSIS — Z79899 Other long term (current) drug therapy: Secondary | ICD-10-CM | POA: Diagnosis not present

## 2021-11-18 DIAGNOSIS — Z5112 Encounter for antineoplastic immunotherapy: Secondary | ICD-10-CM | POA: Diagnosis not present

## 2021-11-18 DIAGNOSIS — C182 Malignant neoplasm of ascending colon: Secondary | ICD-10-CM

## 2021-11-18 DIAGNOSIS — C50911 Malignant neoplasm of unspecified site of right female breast: Secondary | ICD-10-CM

## 2021-11-18 DIAGNOSIS — Z170 Estrogen receptor positive status [ER+]: Secondary | ICD-10-CM | POA: Diagnosis not present

## 2021-11-18 LAB — CBC WITH DIFFERENTIAL/PLATELET
Abs Immature Granulocytes: 0.03 10*3/uL (ref 0.00–0.07)
Basophils Absolute: 0.1 10*3/uL (ref 0.0–0.1)
Basophils Relative: 1 %
Eosinophils Absolute: 0 10*3/uL (ref 0.0–0.5)
Eosinophils Relative: 0 %
HCT: 39.5 % (ref 36.0–46.0)
Hemoglobin: 12.3 g/dL (ref 12.0–15.0)
Immature Granulocytes: 0 %
Lymphocytes Relative: 12 %
Lymphs Abs: 0.9 10*3/uL (ref 0.7–4.0)
MCH: 28.6 pg (ref 26.0–34.0)
MCHC: 31.1 g/dL (ref 30.0–36.0)
MCV: 91.9 fL (ref 80.0–100.0)
Monocytes Absolute: 0.4 10*3/uL (ref 0.1–1.0)
Monocytes Relative: 5 %
Neutro Abs: 5.8 10*3/uL (ref 1.7–7.7)
Neutrophils Relative %: 82 %
Platelets: 194 10*3/uL (ref 150–400)
RBC: 4.3 MIL/uL (ref 3.87–5.11)
RDW: 14.4 % (ref 11.5–15.5)
WBC: 7.1 10*3/uL (ref 4.0–10.5)
nRBC: 0 % (ref 0.0–0.2)

## 2021-11-18 LAB — COMPREHENSIVE METABOLIC PANEL
ALT: 12 U/L (ref 0–44)
AST: 18 U/L (ref 15–41)
Albumin: 3.8 g/dL (ref 3.5–5.0)
Alkaline Phosphatase: 89 U/L (ref 38–126)
Anion gap: 7 (ref 5–15)
BUN: 14 mg/dL (ref 8–23)
CO2: 24 mmol/L (ref 22–32)
Calcium: 9.1 mg/dL (ref 8.9–10.3)
Chloride: 104 mmol/L (ref 98–111)
Creatinine, Ser: 1.49 mg/dL — ABNORMAL HIGH (ref 0.44–1.00)
GFR, Estimated: 35 mL/min — ABNORMAL LOW (ref >60)
Glucose, Bld: 117 mg/dL — ABNORMAL HIGH (ref 70–99)
Potassium: 3.5 mmol/L (ref 3.5–5.1)
Sodium: 135 mmol/L (ref 135–145)
Total Bilirubin: 0.6 mg/dL (ref 0.3–1.2)
Total Protein: 6.5 g/dL (ref 6.5–8.1)

## 2021-11-18 LAB — TSH: TSH: 0.316 u[IU]/mL — ABNORMAL LOW (ref 0.350–4.500)

## 2021-11-18 MED ORDER — SODIUM CHLORIDE 0.9 % IV SOLN: Freq: Once | INTRAVENOUS | Status: AC

## 2021-11-18 MED ORDER — HEPARIN SOD (PORK) LOCK FLUSH 100 UNIT/ML IV SOLN
500.0000 [IU] | Freq: Once | INTRAVENOUS | Status: AC | PRN
  Administered 2021-11-18: 16:00:00 500 [IU]

## 2021-11-18 MED ORDER — SODIUM CHLORIDE 0.9% FLUSH
10.0000 mL | INTRAVENOUS | Status: DC | PRN
  Administered 2021-11-18: 16:00:00 10 mL

## 2021-11-18 MED ORDER — SODIUM CHLORIDE 0.9 % IV SOLN
200.0000 mg | Freq: Once | INTRAVENOUS | Status: AC
  Administered 2021-11-18: 15:00:00 200 mg via INTRAVENOUS
  Filled 2021-11-18: qty 8

## 2021-11-18 NOTE — Patient Instructions (Signed)
[facility]  Discharge Instructions: ?Thank you for choosing [facility] to provide your oncology and hematology care.  ?If you have a lab appointment with the Cancer Center, please come in thru the Main Entrance and check in at the main information desk. ? ?Wear comfortable clothing and clothing appropriate for easy access to any Portacath or PICC line.  ? ?We strive to give you quality time with your provider. You may need to reschedule your appointment if you arrive late (15 or more minutes).  Arriving late affects you and other patients whose appointments are after yours.  Also, if you miss three or more appointments without notifying the office, you may be dismissed from the clinic at the provider?s discretion.    ?  ?For prescription refill requests, have your pharmacy contact our office and allow 72 hours for refills to be completed.   ? ?Today you received the following chemotherapy and/or immunotherapy agents Keytruda, return as scheduled.  ?  ?To help prevent nausea and vomiting after your treatment, we encourage you to take your nausea medication as directed. ? ?BELOW ARE SYMPTOMS THAT SHOULD BE REPORTED IMMEDIATELY: ?*FEVER GREATER THAN 100.4 F (38 ?C) OR HIGHER ?*CHILLS OR SWEATING ?*NAUSEA AND VOMITING THAT IS NOT CONTROLLED WITH YOUR NAUSEA MEDICATION ?*UNUSUAL SHORTNESS OF BREATH ?*UNUSUAL BRUISING OR BLEEDING ?*URINARY PROBLEMS (pain or burning when urinating, or frequent urination) ?*BOWEL PROBLEMS (unusual diarrhea, constipation, pain near the anus) ?TENDERNESS IN MOUTH AND THROAT WITH OR WITHOUT PRESENCE OF ULCERS (sore throat, sores in mouth, or a toothache) ?UNUSUAL RASH, SWELLING OR PAIN  ?UNUSUAL VAGINAL DISCHARGE OR ITCHING  ? ?Items with * indicate a potential emergency and should be followed up as soon as possible or go to the Emergency Department if any problems should occur. ? ?Please show the CHEMOTHERAPY ALERT CARD or IMMUNOTHERAPY ALERT CARD at check-in to  the Emergency Department and triage nurse. ? ?Should you have questions after your visit or need to cancel or reschedule your appointment, please contact [facility] [phone]  and follow the prompts.  Office hours are 8:00 a.m. to 4:30 p.m. Monday - Friday. Please note that voicemails left after 4:00 p.m. may not be returned until the following business day.  We are closed weekends and major holidays. You have access to a nurse at all times for urgent questions. Please call the main number to the clinic [phone] and follow the prompts. ? ?For any non-urgent questions, you may also contact your provider using MyChart. We now offer e-Visits for anyone 18 and older to request care online for non-urgent symptoms. For details visit [email or web address]. ?  ?Also download the MyChart app! Go to the app store, search "MyChart", open the app, select [facility], and log in with your MyChart username and password. ? ?Due to Covid, a mask is required upon entering the hospital/clinic. If you do not have a mask, one will be given to you upon arrival. For doctor visits, patients may have 1 support person aged 18 or older with them. For treatment visits, patients cannot have anyone with them due to current Covid guidelines and our immunocompromised population.  ?

## 2021-11-18 NOTE — Progress Notes (Signed)
Patient tolerated therapy with no complaints voiced.  Side effects with management reviewed with understanding verbalized.  Port site clean and dry with no bruising or swelling noted at site.  Good blood return noted before and after administration of therapy.  Band aid applied.  Patient left in satisfactory condition with VSS and no s/s of distress noted.  

## 2021-11-19 ENCOUNTER — Other Ambulatory Visit: Payer: Self-pay | Admitting: Cardiology

## 2021-11-19 LAB — CEA: CEA: 7.1 ng/mL — ABNORMAL HIGH (ref 0.0–4.7)

## 2021-11-25 ENCOUNTER — Other Ambulatory Visit: Payer: Self-pay | Admitting: Cardiology

## 2021-12-09 ENCOUNTER — Other Ambulatory Visit: Payer: Self-pay | Admitting: Cardiology

## 2021-12-09 ENCOUNTER — Inpatient Hospital Stay (HOSPITAL_COMMUNITY): Payer: Medicare Other | Attending: Hematology

## 2021-12-09 ENCOUNTER — Encounter (HOSPITAL_COMMUNITY): Payer: Self-pay | Admitting: Hematology

## 2021-12-09 ENCOUNTER — Inpatient Hospital Stay (HOSPITAL_COMMUNITY): Payer: Medicare Other

## 2021-12-09 ENCOUNTER — Other Ambulatory Visit: Payer: Self-pay

## 2021-12-09 ENCOUNTER — Other Ambulatory Visit (HOSPITAL_COMMUNITY): Payer: Self-pay | Admitting: Cardiology

## 2021-12-09 ENCOUNTER — Inpatient Hospital Stay (HOSPITAL_BASED_OUTPATIENT_CLINIC_OR_DEPARTMENT_OTHER): Payer: Medicare Other | Admitting: Hematology

## 2021-12-09 VITALS — BP 137/55 | HR 83 | Temp 97.4°F | Resp 18 | Ht 62.0 in | Wt 162.5 lb

## 2021-12-09 VITALS — BP 119/55 | HR 59 | Temp 98.0°F | Resp 18

## 2021-12-09 DIAGNOSIS — Z79899 Other long term (current) drug therapy: Secondary | ICD-10-CM | POA: Diagnosis not present

## 2021-12-09 DIAGNOSIS — Z170 Estrogen receptor positive status [ER+]: Secondary | ICD-10-CM | POA: Insufficient documentation

## 2021-12-09 DIAGNOSIS — Z5112 Encounter for antineoplastic immunotherapy: Secondary | ICD-10-CM | POA: Diagnosis not present

## 2021-12-09 DIAGNOSIS — C182 Malignant neoplasm of ascending colon: Secondary | ICD-10-CM | POA: Diagnosis not present

## 2021-12-09 DIAGNOSIS — C50911 Malignant neoplasm of unspecified site of right female breast: Secondary | ICD-10-CM

## 2021-12-09 DIAGNOSIS — I739 Peripheral vascular disease, unspecified: Secondary | ICD-10-CM

## 2021-12-09 LAB — CBC WITH DIFFERENTIAL/PLATELET
Abs Immature Granulocytes: 0.03 10*3/uL (ref 0.00–0.07)
Basophils Absolute: 0.1 10*3/uL (ref 0.0–0.1)
Basophils Relative: 1 %
Eosinophils Absolute: 0 10*3/uL (ref 0.0–0.5)
Eosinophils Relative: 0 %
HCT: 38.3 % (ref 36.0–46.0)
Hemoglobin: 12 g/dL (ref 12.0–15.0)
Immature Granulocytes: 1 %
Lymphocytes Relative: 16 %
Lymphs Abs: 1 10*3/uL (ref 0.7–4.0)
MCH: 28.6 pg (ref 26.0–34.0)
MCHC: 31.3 g/dL (ref 30.0–36.0)
MCV: 91.2 fL (ref 80.0–100.0)
Monocytes Absolute: 0.4 10*3/uL (ref 0.1–1.0)
Monocytes Relative: 6 %
Neutro Abs: 5.1 10*3/uL (ref 1.7–7.7)
Neutrophils Relative %: 76 %
Platelets: 203 10*3/uL (ref 150–400)
RBC: 4.2 MIL/uL (ref 3.87–5.11)
RDW: 14.6 % (ref 11.5–15.5)
WBC: 6.6 10*3/uL (ref 4.0–10.5)
nRBC: 0 % (ref 0.0–0.2)

## 2021-12-09 LAB — COMPREHENSIVE METABOLIC PANEL
ALT: 12 U/L (ref 0–44)
AST: 19 U/L (ref 15–41)
Albumin: 3.6 g/dL (ref 3.5–5.0)
Alkaline Phosphatase: 80 U/L (ref 38–126)
Anion gap: 8 (ref 5–15)
BUN: 15 mg/dL (ref 8–23)
CO2: 26 mmol/L (ref 22–32)
Calcium: 9.2 mg/dL (ref 8.9–10.3)
Chloride: 106 mmol/L (ref 98–111)
Creatinine, Ser: 1.54 mg/dL — ABNORMAL HIGH (ref 0.44–1.00)
GFR, Estimated: 34 mL/min — ABNORMAL LOW (ref >60)
Glucose, Bld: 122 mg/dL — ABNORMAL HIGH (ref 70–99)
Potassium: 3.6 mmol/L (ref 3.5–5.1)
Sodium: 140 mmol/L (ref 135–145)
Total Bilirubin: 0.8 mg/dL (ref 0.3–1.2)
Total Protein: 6.5 g/dL (ref 6.5–8.1)

## 2021-12-09 LAB — TSH: TSH: 0.651 u[IU]/mL (ref 0.350–4.500)

## 2021-12-09 MED ORDER — HEPARIN SOD (PORK) LOCK FLUSH 100 UNIT/ML IV SOLN
500.0000 [IU] | Freq: Once | INTRAVENOUS | Status: AC | PRN
  Administered 2021-12-09: 500 [IU]

## 2021-12-09 MED ORDER — SODIUM CHLORIDE 0.9 % IV SOLN
200.0000 mg | Freq: Once | INTRAVENOUS | Status: AC
  Administered 2021-12-09: 200 mg via INTRAVENOUS
  Filled 2021-12-09: qty 8

## 2021-12-09 MED ORDER — SODIUM CHLORIDE 0.9 % IV SOLN: Freq: Once | INTRAVENOUS | Status: AC

## 2021-12-09 MED ORDER — SODIUM CHLORIDE 0.9% FLUSH
10.0000 mL | INTRAVENOUS | Status: DC | PRN
  Administered 2021-12-09: 10 mL

## 2021-12-09 NOTE — Patient Instructions (Signed)
[facility]  Discharge Instructions: Thank you for choosing [facility] to provide your oncology and hematology care.  If you have a lab appointment with the Cancer Center, please come in thru the Main Entrance and check in at the main information desk.  Wear comfortable clothing and clothing appropriate for easy access to any Portacath or PICC line.   We strive to give you quality time with your provider. You may need to reschedule your appointment if you arrive late (15 or more minutes).  Arriving late affects you and other patients whose appointments are after yours.  Also, if you miss three or more appointments without notifying the office, you may be dismissed from the clinic at the provider's discretion.      For prescription refill requests, have your pharmacy contact our office and allow 72 hours for refills to be completed.    Today you received the following chemotherapy and/or immunotherapy agents Keytruda       To help prevent nausea and vomiting after your treatment, we encourage you to take your nausea medication as directed.  BELOW ARE SYMPTOMS THAT SHOULD BE REPORTED IMMEDIATELY: *FEVER GREATER THAN 100.4 F (38 C) OR HIGHER *CHILLS OR SWEATING *NAUSEA AND VOMITING THAT IS NOT CONTROLLED WITH YOUR NAUSEA MEDICATION *UNUSUAL SHORTNESS OF BREATH *UNUSUAL BRUISING OR BLEEDING *URINARY PROBLEMS (pain or burning when urinating, or frequent urination) *BOWEL PROBLEMS (unusual diarrhea, constipation, pain near the anus) TENDERNESS IN MOUTH AND THROAT WITH OR WITHOUT PRESENCE OF ULCERS (sore throat, sores in mouth, or a toothache) UNUSUAL RASH, SWELLING OR PAIN  UNUSUAL VAGINAL DISCHARGE OR ITCHING   Items with * indicate a potential emergency and should be followed up as soon as possible or go to the Emergency Department if any problems should occur.  Please show the CHEMOTHERAPY ALERT CARD or IMMUNOTHERAPY ALERT CARD at check-in to the Emergency  Department and triage nurse.  Should you have questions after your visit or need to cancel or reschedule your appointment, please contact [facility] [phone]  and follow the prompts.  Office hours are 8:00 a.m. to 4:30 p.m. Monday - Friday. Please note that voicemails left after 4:00 p.m. may not be returned until the following business day.  We are closed weekends and major holidays. You have access to a nurse at all times for urgent questions. Please call the main number to the clinic [phone] and follow the prompts.  For any non-urgent questions, you may also contact your provider using MyChart. We now offer e-Visits for anyone 18 and older to request care online for non-urgent symptoms. For details visit [email or web address].   Also download the MyChart app! Go to the app store, search "MyChart", open the app, select [facility], and log in with your MyChart username and password.  Due to Covid, a mask is required upon entering the hospital/clinic. If you do not have a mask, one will be given to you upon arrival. For doctor visits, patients may have 1 support person aged 18 or older with them. For treatment visits, patients cannot have anyone with them due to current Covid guidelines and our immunocompromised population.  

## 2021-12-09 NOTE — Progress Notes (Signed)
[place], [place] [address]   CLINIC:  Medical Oncology/Hematology  PCP:  Antony Contras, MD [address] / [place] [address] [phone]   REASON FOR VISIT:  Follow-up for stage I right breast cancer and colon cancer  PRIOR THERAPY:  1. Robotic proximal colectomy on 10/11/2019. 2. Right breast lumpectomy and SLNB on 04/02/2020  NGS Results: MSI--high, Foundation 1 not sent  CURRENT THERAPY: Keytruda every 3 weeks  BRIEF ONCOLOGIC HISTORY:  Oncology History  Cancer of ascending colon s/p robotic proximal colectomy 10/11/2019  10/11/2019 Initial Diagnosis   Cancer of ascending colon s/p robotic proximal colectomy 10/11/2019   10/23/2019 Cancer Staging   Staging form: Colon and Rectum, AJCC 8th Edition - Clinical stage from 10/23/2019: Stage IVA (cT3, cN1c, cM1a) - Signed by Derek Jack, MD on 12/14/2019    05/14/2020 -  Chemotherapy   Patient is on Treatment Plan : COLORECTAL Pembrolizumab q21d     Malignant neoplasm of right female breast ([place])  04/23/2020 Initial Diagnosis   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 [facility])   04/23/2020 Cancer Staging   Staging form: Breast, AJCC 8th Edition - Clinical stage from 04/23/2020: Stage IA (cT1c, cN0(sn), cM0, G2, ER+, PR+, HER2-) - Signed by Derek Jack, MD on 04/23/2020      CANCER STAGING:  Cancer Staging  Cancer of ascending colon s/p robotic proximal colectomy 10/11/2019 Staging form: Colon and Rectum, AJCC 8th Edition - Clinical stage from 10/23/2019: Stage IVA (cT3, cN1c, cM1a) - Signed by Derek Jack, MD on 12/14/2019  Malignant neoplasm of right female breast [facility]) Staging form: Breast, AJCC 8th Edition - Clinical stage from 04/23/2020: Stage IA (cT1c, cN0(sn), cM0, G2, ER+, PR+, HER2-) - Signed by Derek Jack, MD on 04/23/2020   INTERVAL HISTORY:  [name], a 81 y.o. female, returns for routine  follow-up and consideration for next cycle of chemotherapy. Marilea was last seen on 10/28/2021.  Due for cycle #27 of Keytruda today.   Overall, she tells me she has been feeling pretty well. She reports an episode of hematuria lasting 1 week which has resolved. She denies dysuria. She reports SOB with exertion and stable diarrhea. She denies skin rash. Her appetite is good and she denies nausea and vomiting. She reports slight left sided abdominal pain. She reports occasional itching.   Overall, she feels ready for next cycle of chemo today.    REVIEW OF SYSTEMS:  Review of Systems  Constitutional:  Positive for fatigue. Negative for appetite change.  HENT:   Positive for trouble swallowing.   Respiratory:  Positive for shortness of breath.   Gastrointestinal:  Positive for abdominal pain (L side), constipation and diarrhea (stable). Negative for nausea and vomiting.  Genitourinary:  Positive for difficulty urinating and hematuria. Negative for dysuria.   Skin:  Negative for itching and rash.  All other systems reviewed and are negative.  PAST MEDICAL/SURGICAL HISTORY:  Past Medical History:  Diagnosis Date   Abdominal aortic aneurysm    Acid reflux    Anemia    Arthritis    knees , R shoulder - tx /w injection - 11/2014   Basal cell carcinoma (BCC) of dorsum of nose 2010   Resolved   Cancer ([facility])    basal cell on nose   Colon cancer (H[facility])    Coronary artery disease    Hypercholesteremia    Hypertension    Hypothyroidism  Lt Acute pyelonephritis 09/11/2018   MI, old 2000   Peripheral arterial disease ([facility])    hhigh-grade ostial bilateral calcified iliac stenosis with claudication   Tobacco abuse    Vertigo    when lays on left side.   Past Surgical History:  Procedure Laterality Date   BREAST LUMPECTOMY WITH RADIOACTIVE SEED AND SENTINEL LYMPH NODE BIOPSY Bilateral 04/02/2020   Procedure: BILATERAL BREAST LUMPECTOMY WITH RADIOACTIVE SEED AND RIGHT SENTINEL LYMPH NODE  BIOPSY AND RIGHT TARGETED AXILLARY LYMPH NODE BIOPSY;  Surgeon: Erroll Luna, MD;  Location: [facility];  Service: General;  Laterality: Bilateral;   CARDIAC CATHETERIZATION  5 stents   CARDIAC CATHETERIZATION N/A 01/20/2016   Procedure: Left Heart Cath and Coronary Angiography;  Surgeon: Peter M Martinique, MD;  Location: [facility];  Service: Cardiovascular;  Laterality: N/A;   CATARACT EXTRACTION W/PHACO Left 05/24/2014   Procedure: CATARACT EXTRACTION PHACO AND INTRAOCULAR LENS PLACEMENT (IOC);  Surgeon: Tonny Branch, MD;  Location: [facility];  Service: Ophthalmology;  Laterality: Left;  CDE:  9.30   CATARACT EXTRACTION W/PHACO Right 06/18/2014   Procedure: CATARACT EXTRACTION PHACO AND INTRAOCULAR LENS PLACEMENT RIGHT EYE CDE=10.84;  Surgeon: Tonny Branch, MD;  Location: [facility];  Service: Ophthalmology;  Laterality: Right;   CORONARY ARTERY BYPASS GRAFT N/A 01/24/2016   Procedure: CORONARY ARTERY BYPASS GRAFTING (CABG);  Surgeon: Gaye Pollack, MD;  Location: [facility];  Service: Open Heart Surgery;  Laterality: N/A;   CORONARY STENT PLACEMENT     CORONARY STENT PLACEMENT  03/06/15   CFX DES   CYSTOSCOPY/URETEROSCOPY/HOLMIUM LASER/STENT PLACEMENT Left 09/12/2018   Procedure: CYSTOSCOPY/URETEROSCOPY/STENT PLACEMENT;  Surgeon: Ceasar Mons, MD;  Location: [facility];  Service: Urology;  Laterality: Left;   EYE SURGERY     LEFT HEART CATH AND CORS/GRAFTS ANGIOGRAPHY N/A 09/12/2019   Procedure: LEFT HEART CATH AND CORS/GRAFTS ANGIOGRAPHY;  Surgeon: Troy Sine, MD;  Location: [facility];  Service: Cardiovascular;  Laterality: N/A;   LEFT HEART CATHETERIZATION WITH CORONARY ANGIOGRAM N/A 03/06/2015   Procedure: LEFT HEART CATHETERIZATION WITH CORONARY ANGIOGRAM;  Surgeon: Lorretta Harp, MD;  Location: [facility];  Service: Cardiovascular;  Laterality: N/A;   PARTIAL KNEE ARTHROPLASTY Right 02/04/2015   Procedure: UNICOMPARTMENTAL KNEE;  Surgeon: Dorna Leitz, MD;   Location: [facility];  Service: Orthopedics;  Laterality: Right;   PERIPHERAL VASCULAR CATHETERIZATION Bilateral 05/13/2015   Procedure: Lower Extremity Angiography;  Surgeon: Lorretta Harp, MD;  Location: [facility];  Service: Cardiovascular;  Laterality: Bilateral;   PERIPHERAL VASCULAR CATHETERIZATION N/A 05/13/2015   Procedure: Abdominal Aortogram;  Surgeon: Lorretta Harp, MD;  Location: [facility];  Service: Cardiovascular;  Laterality: N/A;   PERIPHERAL VASCULAR CATHETERIZATION Bilateral 06/27/2015   Procedure: Peripheral Vascular Intervention;  Surgeon: Lorretta Harp, MD;  Location: [facility];  Service: Cardiovascular;  Laterality: Bilateral;  ILIACS   PERIPHERAL VASCULAR CATHETERIZATION Bilateral 06/27/2015   Procedure: Peripheral Vascular Atherectomy;  Surgeon: Lorretta Harp, MD;  Location: [facility];  Service: Cardiovascular;  Laterality: Bilateral;   PORTACATH PLACEMENT Left 05/24/2020   Procedure: INSERTION PORT-A-CATH;  Surgeon: Aviva Signs, MD;  Location: [facility];  Service: General;  Laterality: Left;   TEE WITHOUT CARDIOVERSION N/A 01/24/2016   Procedure: TRANSESOPHAGEAL ECHOCARDIOGRAM (TEE);  Surgeon: Gaye Pollack, MD;  Location: [facility];  Service: Open Heart Surgery;  Laterality: N/A;   TONSILLECTOMY     age 55   TUBAL LIGATION  SOCIAL HISTORY:  Social History   Socioeconomic History   Marital status: Married    Spouse name: Tommy   Number of children: 4   Years of education: Not on file   Highest education level: Not on file  Occupational History   Occupation: retired    Comment: worked as Presenter, broadcasting for [employer]  Tobacco Use   Smoking status: Former    Packs/day: 1.50    Years: 42.00    Pack years: 63.00    Types: Cigarettes    Quit date: 05/19/1999    Years since quitting: 22.5   Smokeless tobacco: Never  Vaping Use   Vaping Use: Never used  Substance and Sexual Activity   Alcohol use: No   Drug use: No    Sexual activity: Yes    Birth control/protection: Surgical  Other Topics Concern   Not on file  Social History Narrative   Not on file   Social Determinants of Health   Financial Resource Strain: Medium Risk   Difficulty of Paying Living Expenses: Somewhat hard  Food Insecurity: No Food Insecurity   Worried About Charity fundraiser in the Last Year: Never true   [place] in the Last Year: Never true  Transportation Needs: No Transportation Needs   Lack of Transportation (Medical): No   Lack of Transportation (Non-Medical): No  Physical Activity: Inactive   Days of Exercise per Week: 0 days   Minutes of Exercise per Session: 0 min  Stress: No Stress Concern Present   Feeling of Stress : Not at all  Social Connections: Moderately Isolated   Frequency of Communication with Friends and Family: More than three times a week   Frequency of Social Gatherings with Friends and Family: Three times a week   Attends Religious Services: Never   Active Member of Clubs or Organizations: No   Attends Archivist Meetings: Never   Marital Status: Married  Human resources officer Violence: Not At Risk   Fear of Current or Ex-Partner: No   Emotionally Abused: No   Physically Abused: No   Sexually Abused: No    FAMILY HISTORY:  Family History  Problem Relation Age of Onset   Ovarian cancer Mother 23   Cancer Father 56       unsure of which kind, "it was in his glands"   Hypertension Maternal Grandmother    Stroke Maternal Grandfather    Hypertension Son    Brain cancer Sister    Hypertension Daughter    Heart attack Neg Hx    Colon cancer Neg Hx    Esophageal cancer Neg Hx    Inflammatory bowel disease Neg Hx    Liver disease Neg Hx    Pancreatic cancer Neg Hx    Rectal cancer Neg Hx    Stomach cancer Neg Hx     CURRENT MEDICATIONS:  Current Outpatient Medications  Medication Sig Dispense Refill   amLODipine (NORVASC) 5 MG tablet TAKE 1 TABLET BY MOUTH ONCE A DAY.  30 tablet 6   anastrozole (ARIMIDEX) 1 MG tablet TAKE 1 TABLET BY MOUTH ONCE DAILY. 30 tablet 6   aspirin EC 81 MG tablet Take 81 mg by mouth every evening.      atorvastatin (LIPITOR) 40 MG tablet TAKE (1) TABLET BY MOUTH ONCE DAILY. 15 tablet 0   clopidogrel (PLAVIX) 75 MG tablet TAKE (1) TABLET BY MOUTH ONCE DAILY. Please make overdue appt with Dr. Marlou Porch before anymore refills. Thank you 1st  attempt 30 tablet 0   esomeprazole (NEXIUM) 20 MG capsule Take 20 mg by mouth daily.      HYDROcodone-acetaminophen (NORCO/VICODIN) 5-325 MG tablet Take 1 tablet by mouth daily.     hydrOXYzine (ATARAX/VISTARIL) 25 MG tablet TAKE (1) TABLET BY MOUTH AT BEDTIME AS NEEDED 30 tablet 6   levothyroxine (SYNTHROID) 25 MCG tablet Take 25 mcg by mouth daily.     levothyroxine (SYNTHROID) 50 MCG tablet Take 50 mcg by mouth daily.     lidocaine-prilocaine (EMLA) cream Apply to affected area once 30 g 3   metoprolol tartrate (LOPRESSOR) 25 MG tablet TAKE 1 TABLET BY MOUTH TWICE DAILY. 30 tablet 0   nitroGLYCERIN (NITROSTAT) 0.4 MG SL tablet Place 1 tablet (0.4 mg total) under the tongue every 5 (five) minutes as needed for chest pain. 25 tablet prn   Pembrolizumab (KEYTRUDA IV) Inject 200 mg into the vein every 21 ( twenty-one) days.      pembrolizumab (KEYTRUDA) 100 MG/4ML SOLN See admin instructions.     Vitamin D, Ergocalciferol, (DRISDOL) 1.25 MG (50000 UNIT) CAPS capsule TAKE 1 CAPSULE BY MOUTH ONCE A WEEK. 4 capsule 3   prochlorperazine (COMPAZINE) 10 MG tablet Take 1 tablet (10 mg total) by mouth every 6 (six) hours as needed (Nausea or vomiting). 30 tablet 1   No current facility-administered medications for this visit.    ALLERGIES:  Allergies  Allergen Reactions   Crab [Shellfish Allergy] Nausea And Vomiting    Throws up violently   Ezetimibe Diarrhea   Other Nausea And Vomiting    Shrimp    PHYSICAL EXAM:  Performance status (ECOG): 1 - Symptomatic but completely ambulatory  Vitals:    12/09/21 1217  BP: (!) 137/55  Pulse: 83  Resp: 18  Temp: (!) 97.4 F (36.3 C)  SpO2: 97%   Wt Readings from Last 3 Encounters:  12/09/21 162 lb 8 oz (73.7 kg)  11/18/21 163 lb 3.2 oz (74 kg)  10/28/21 160 lb 3.2 oz (72.7 kg)   Physical Exam Vitals reviewed.  Constitutional:      Appearance: Normal appearance.  Cardiovascular:     Rate and Rhythm: Normal rate and regular rhythm.     Pulses: Normal pulses.     Heart sounds: Normal heart sounds.  Pulmonary:     Effort: Pulmonary effort is normal.     Breath sounds: Normal breath sounds.  Abdominal:     Palpations: Abdomen is soft. There is no hepatomegaly, splenomegaly or mass.     Tenderness: There is no abdominal tenderness.  Neurological:     General: No focal deficit present.     Mental Status: She is alert and oriented to person, place, and time.  Psychiatric:        Mood and Affect: Mood normal.        Behavior: Behavior normal.    LABORATORY DATA:  I have reviewed the labs as listed.  CBC Latest Ref Rng & Units 12/09/2021 11/18/2021 10/28/2021  WBC 4.0 - 10.5 K/uL 6.6 7.1 5.7  Hemoglobin 12.0 - 15.0 g/dL 12.0 12.3 12.6  Hematocrit 36.0 - 46.0 % 38.3 39.5 40.2  Platelets 150 - 400 K/uL 203 194 210   CMP Latest Ref Rng & Units 12/09/2021 11/18/2021 10/28/2021  Glucose 70 - 99 mg/dL 122(H) 117(H) 101(H)  BUN 8 - 23 mg/dL _0 Creatinine 0.44 - 1.00 mg/dL 1.54(H) 1.49(H) 1.61(H)  Sodium 135 - 145 mmol/L 140 135 141  Potassium 3.5 - 5.1  mmol/L 3.6 3.5 3.6  Chloride 98 - 111 mmol/L 106 104 107  CO2 22 - 32 mmol/L _0 Calcium 8.9 - 10.3 mg/dL 9.2 9.1 9.2  Total Protein 6.5 - 8.1 g/dL 6.5 6.5 6.4(L)  Total Bilirubin 0.3 - 1.2 mg/dL 0.8 0.6 0.3  Alkaline Phos 38 - 126 U/L 80 89 88  AST 15 - 41 U/L _1 ALT 0 - 44 U/L _2 DIAGNOSTIC IMAGING:  I have independently reviewed the scans and discussed with the patient. No results found.   ASSESSMENT:  1.  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  Right hemicolectomy on 10/11/2019, grade 3 adenocarcinoma, negative margins, positive LVSI, 2 tumor deposits, 0/15 lymph nodes positive, PT3PN1C. 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  MLH1 hyper methylation present. -PET scan in December 2020 showed lymph node in the right axillary region.  Biopsy consistent with metastatic colon cancer. -Last CEA was 10.7 on 11/22/2019. -Right axillary lymph node excision on 04/02/2020 consistent with metastatic carcinoma from colon cancer. -Pembrolizumab started on 05/14/2020. -PET scan on 08/05/2020 shows complete resolution of lymphadenopathy.  No evidence of new areas of uptake.  Mild vague residual hypermetabolism in the right axilla without soft tissue mass. -PET scan on 12/23/2020 with no evidence of recurrence.   2.  Stage I (PT1CPN0) right Breast, Grade 2 Invasive Lobular Carcinoma: -MRI of the breast on 12/25/2019 showed 1 cm mass behind the right areola with a suspicious mass in the left breast. -Right breast lumpectomy on 04/02/2020 shows invasive lobular carcinoma, grade 2, 1.2 cm.  Resection margins are negative.  Negative LVSI.  2 sentinel lymph nodes were negative for carcinoma.  ER/PR 100% positive, HER-2 negative, Ki-67 10%.   PLAN:  1.  Stage IV colon cancer to the right axillary lymph node: - CT CAP on 08/08/2021 showed no evidence of recurrence in the chest, abdomen or pelvis. - She does not have any lymphadenopathy in the axillary region on examination today. - Denies any immunotherapy related side effects. - We have reviewed her labs today which showed normal LFTs.  Creatinine is stable at 1.5.  CBC was normal.  TSH was 0.651.  CEA was 7.1 on 11/18/2021. - She will proceed with treatment today and in 3 weeks.  RTC 6 weeks with CT CAP with oral contrast and CEA level.   2.  Right breast invasive lobular carcinoma, grade 2: - Continue  anastrozole daily.  No side effects.   3.  Osteoporosis: - Continue vitamin D 50,000 units weekly.   4.  Bladder cancer: - She reports occasional hematuria. - I have recommended follow-up with urology at [facility].  5.  Generalized pruritus: - Continue Atarax as needed.   Orders placed this encounter:  Orders Placed This Encounter  Procedures   CT CHEST ABDOMEN PELVIS WO CONTRAST     Derek Jack, MD [facility] [phone]   I, Thana Ates, am acting as a scribe for Dr. Derek Jack.  I, Derek Jack MD, have reviewed the above documentation for accuracy and completeness, and I agree with the above.

## 2021-12-09 NOTE — Patient Instructions (Signed)
[place] at [facility] Discharge Instructions   You were seen and examined today by Dr. Delton Coombes.  He reviewed your lab work today, which is normal/stable  We will proceed with your treatment today.  We will obtain a CT scan prior to your next visit with Dr. Raliegh Ip.  Return as scheduled for lab work, treatment, and office visit.     Thank you for choosing [place] at [facility] to provide your oncology and hematology care.  To afford each patient quality time with our provider, please arrive at least 15 minutes before your scheduled appointment time.   If you have a lab appointment with the [facility] please come in thru the Main Entrance and check in at the main information desk.  You need to re-schedule your appointment should you arrive 10 or more minutes late.  We strive to give you quality time with our providers, and arriving late affects you and other patients whose appointments are after yours.  Also, if you no show three or more times for appointments you may be dismissed from the clinic at the providers discretion.     Again, thank you for choosing [facility].  Our hope is that these requests will decrease the amount of time that you wait before being seen by our physicians.       _____________________________________________________________  Should you have questions after your visit to [facility], please contact our office at [phone] and follow the prompts.  Our office hours are 8:00 a.m. and 4:30 p.m. Monday - Friday.  Please note that voicemails left after 4:00 p.m. may not be returned until the following business day.  We are closed weekends and major holidays.  You do have access to a nurse 24-7, just call the main number to the clinic [phone] and do not press any options, hold on the line and a nurse will answer the phone.    For prescription refill requests, have your pharmacy  contact our office and allow 72 hours.    Due to Covid, you will need to wear a mask upon entering the hospital. If you do not have a mask, a mask will be given to you at the Main Entrance upon arrival. For doctor visits, patients may have 1 support person age 16 or older with them. For treatment visits, patients can not have anyone with them due to social distancing guidelines and our immunocompromised population.

## 2021-12-09 NOTE — Progress Notes (Signed)
Pt presents today for Keytruda per provider's order. Vital signs and labs WNL for treatment today. Okay to proceed with treatment per Dr.K. Pt voiced no new complaints at this time.  Keytruda given today per MD orders. Tolerated infusion without adverse affects. Vital signs stable. No complaints at this time. Discharged from clinic ambulatory in stable condition. Alert and oriented x 3. F/U with [facility] as scheduled.

## 2021-12-09 NOTE — Progress Notes (Signed)
Patient has been examined by Dr. Katragadda, and vital signs and labs have been reviewed. ANC, Creatinine, LFTs, hemoglobin, and platelets are within treatment parameters per M.D. - pt may proceed with treatment.    °

## 2021-12-11 DIAGNOSIS — I739 Peripheral vascular disease, unspecified: Secondary | ICD-10-CM | POA: Diagnosis not present

## 2021-12-11 DIAGNOSIS — Z862 Personal history of diseases of the blood and blood-forming organs and certain disorders involving the immune mechanism: Secondary | ICD-10-CM | POA: Diagnosis not present

## 2021-12-11 DIAGNOSIS — C189 Malignant neoplasm of colon, unspecified: Secondary | ICD-10-CM | POA: Diagnosis not present

## 2021-12-11 DIAGNOSIS — M170 Bilateral primary osteoarthritis of knee: Secondary | ICD-10-CM | POA: Diagnosis not present

## 2021-12-11 DIAGNOSIS — Z1389 Encounter for screening for other disorder: Secondary | ICD-10-CM | POA: Diagnosis not present

## 2021-12-11 DIAGNOSIS — N1832 Chronic kidney disease, stage 3b: Secondary | ICD-10-CM | POA: Diagnosis not present

## 2021-12-11 DIAGNOSIS — Z0000 Encounter for general adult medical examination without abnormal findings: Secondary | ICD-10-CM | POA: Diagnosis not present

## 2021-12-11 DIAGNOSIS — I7409 Other arterial embolism and thrombosis of abdominal aorta: Secondary | ICD-10-CM | POA: Diagnosis not present

## 2021-12-11 DIAGNOSIS — Z853 Personal history of malignant neoplasm of breast: Secondary | ICD-10-CM | POA: Diagnosis not present

## 2021-12-15 ENCOUNTER — Other Ambulatory Visit: Payer: Self-pay | Admitting: Cardiology

## 2021-12-24 ENCOUNTER — Other Ambulatory Visit: Payer: Self-pay | Admitting: Cardiology

## 2021-12-30 ENCOUNTER — Inpatient Hospital Stay (HOSPITAL_COMMUNITY): Payer: Medicare Other

## 2021-12-30 ENCOUNTER — Encounter (HOSPITAL_COMMUNITY): Payer: Self-pay

## 2021-12-30 ENCOUNTER — Other Ambulatory Visit: Payer: Self-pay

## 2021-12-30 ENCOUNTER — Inpatient Hospital Stay (HOSPITAL_COMMUNITY): Payer: Medicare Other | Attending: Hematology

## 2021-12-30 VITALS — BP 157/60 | HR 58 | Temp 97.8°F | Resp 18

## 2021-12-30 DIAGNOSIS — Z5112 Encounter for antineoplastic immunotherapy: Secondary | ICD-10-CM | POA: Insufficient documentation

## 2021-12-30 DIAGNOSIS — C50911 Malignant neoplasm of unspecified site of right female breast: Secondary | ICD-10-CM | POA: Diagnosis not present

## 2021-12-30 DIAGNOSIS — C182 Malignant neoplasm of ascending colon: Secondary | ICD-10-CM | POA: Insufficient documentation

## 2021-12-30 DIAGNOSIS — Z170 Estrogen receptor positive status [ER+]: Secondary | ICD-10-CM | POA: Insufficient documentation

## 2021-12-30 DIAGNOSIS — Z79899 Other long term (current) drug therapy: Secondary | ICD-10-CM | POA: Diagnosis not present

## 2021-12-30 LAB — CBC WITH DIFFERENTIAL/PLATELET
Abs Immature Granulocytes: 0.03 10*3/uL (ref 0.00–0.07)
Basophils Absolute: 0.1 10*3/uL (ref 0.0–0.1)
Basophils Relative: 1 %
Eosinophils Absolute: 0 10*3/uL (ref 0.0–0.5)
Eosinophils Relative: 1 %
HCT: 39 % (ref 36.0–46.0)
Hemoglobin: 12 g/dL (ref 12.0–15.0)
Immature Granulocytes: 1 %
Lymphocytes Relative: 15 %
Lymphs Abs: 1 10*3/uL (ref 0.7–4.0)
MCH: 27.5 pg (ref 26.0–34.0)
MCHC: 30.8 g/dL (ref 30.0–36.0)
MCV: 89.2 fL (ref 80.0–100.0)
Monocytes Absolute: 0.5 10*3/uL (ref 0.1–1.0)
Monocytes Relative: 7 %
Neutro Abs: 5 10*3/uL (ref 1.7–7.7)
Neutrophils Relative %: 75 %
Platelets: 190 10*3/uL (ref 150–400)
RBC: 4.37 MIL/uL (ref 3.87–5.11)
RDW: 14.6 % (ref 11.5–15.5)
WBC: 6.6 10*3/uL (ref 4.0–10.5)
nRBC: 0 % (ref 0.0–0.2)

## 2021-12-30 LAB — COMPREHENSIVE METABOLIC PANEL
ALT: 13 U/L (ref 0–44)
AST: 17 U/L (ref 15–41)
Albumin: 3.9 g/dL (ref 3.5–5.0)
Alkaline Phosphatase: 81 U/L (ref 38–126)
Anion gap: 7 (ref 5–15)
BUN: 22 mg/dL (ref 8–23)
CO2: 25 mmol/L (ref 22–32)
Calcium: 9.4 mg/dL (ref 8.9–10.3)
Chloride: 105 mmol/L (ref 98–111)
Creatinine, Ser: 1.51 mg/dL — ABNORMAL HIGH (ref 0.44–1.00)
GFR, Estimated: 34 mL/min — ABNORMAL LOW (ref >60)
Glucose, Bld: 104 mg/dL — ABNORMAL HIGH (ref 70–99)
Potassium: 3.6 mmol/L (ref 3.5–5.1)
Sodium: 137 mmol/L (ref 135–145)
Total Bilirubin: 0.7 mg/dL (ref 0.3–1.2)
Total Protein: 6.6 g/dL (ref 6.5–8.1)

## 2021-12-30 LAB — TSH: TSH: 0.235 u[IU]/mL — ABNORMAL LOW (ref 0.350–4.500)

## 2021-12-30 MED ORDER — SODIUM CHLORIDE 0.9% FLUSH
10.0000 mL | INTRAVENOUS | Status: DC | PRN
  Administered 2021-12-30: 10 mL

## 2021-12-30 MED ORDER — SODIUM CHLORIDE 0.9 % IV SOLN: Freq: Once | INTRAVENOUS | Status: AC

## 2021-12-30 MED ORDER — HEPARIN SOD (PORK) LOCK FLUSH 100 UNIT/ML IV SOLN
500.0000 [IU] | Freq: Once | INTRAVENOUS | Status: AC | PRN
  Administered 2021-12-30: 500 [IU]

## 2021-12-30 MED ORDER — SODIUM CHLORIDE 0.9 % IV SOLN
200.0000 mg | Freq: Once | INTRAVENOUS | Status: AC
  Administered 2021-12-30: 200 mg via INTRAVENOUS
  Filled 2021-12-30: qty 8

## 2021-12-30 NOTE — Progress Notes (Signed)
Patient presents today for Keytruda infusion.  Patient is in satisfactory condition with no complaints voiced.  Port flushed without difficulty and good blood return noted.  Vital signs are stable.  Creatinine today is 1.51.  All other labs are within treatment parameters.  Ok to proceed with treatment per MD orders.     Patient tolerated treatment well with no complaints voiced.  Patient left ambulatory in stable condition.  Vital signs stable at discharge.  Follow up as scheduled.

## 2021-12-30 NOTE — Patient Instructions (Signed)
[facility]  Discharge Instructions: Thank you for choosing [facility] to provide your oncology and hematology care.  If you have a lab appointment with the Cancer Center, please come in thru the Main Entrance and check in at the main information desk.  Wear comfortable clothing and clothing appropriate for easy access to any Portacath or PICC line.   We strive to give you quality time with your provider. You may need to reschedule your appointment if you arrive late (15 or more minutes).  Arriving late affects you and other patients whose appointments are after yours.  Also, if you miss three or more appointments without notifying the office, you may be dismissed from the clinic at the provider's discretion.      For prescription refill requests, have your pharmacy contact our office and allow 72 hours for refills to be completed.        To help prevent nausea and vomiting after your treatment, we encourage you to take your nausea medication as directed.  BELOW ARE SYMPTOMS THAT SHOULD BE REPORTED IMMEDIATELY: *FEVER GREATER THAN 100.4 F (38 C) OR HIGHER *CHILLS OR SWEATING *NAUSEA AND VOMITING THAT IS NOT CONTROLLED WITH YOUR NAUSEA MEDICATION *UNUSUAL SHORTNESS OF BREATH *UNUSUAL BRUISING OR BLEEDING *URINARY PROBLEMS (pain or burning when urinating, or frequent urination) *BOWEL PROBLEMS (unusual diarrhea, constipation, pain near the anus) TENDERNESS IN MOUTH AND THROAT WITH OR WITHOUT PRESENCE OF ULCERS (sore throat, sores in mouth, or a toothache) UNUSUAL RASH, SWELLING OR PAIN  UNUSUAL VAGINAL DISCHARGE OR ITCHING   Items with * indicate a potential emergency and should be followed up as soon as possible or go to the Emergency Department if any problems should occur.  Please show the CHEMOTHERAPY ALERT CARD or IMMUNOTHERAPY ALERT CARD at check-in to the Emergency Department and triage nurse.  Should you have questions after your visit or need to cancel  or reschedule your appointment, please contact [facility] [phone]  and follow the prompts.  Office hours are 8:00 a.m. to 4:30 p.m. Monday - Friday. Please note that voicemails left after 4:00 p.m. may not be returned until the following business day.  We are closed weekends and major holidays. You have access to a nurse at all times for urgent questions. Please call the main number to the clinic [phone] and follow the prompts.  For any non-urgent questions, you may also contact your provider using MyChart. We now offer e-Visits for anyone 18 and older to request care online for non-urgent symptoms. For details visit [email or web address].   Also download the MyChart app! Go to the app store, search "MyChart", open the app, select [facility], and log in with your MyChart username and password.  Due to Covid, a mask is required upon entering the hospital/clinic. If you do not have a mask, one will be given to you upon arrival. For doctor visits, patients may have 1 support person aged 18 or older with them. For treatment visits, patients cannot have anyone with them due to current Covid guidelines and our immunocompromised population.  

## 2021-12-31 LAB — CEA: CEA: 8.7 ng/mL — ABNORMAL HIGH (ref 0.0–4.7)

## 2022-01-02 ENCOUNTER — Other Ambulatory Visit: Payer: Self-pay | Admitting: Cardiology

## 2022-01-06 ENCOUNTER — Other Ambulatory Visit: Payer: Self-pay | Admitting: Cardiology

## 2022-01-12 ENCOUNTER — Telehealth: Payer: Self-pay | Admitting: Cardiology

## 2022-01-12 MED ORDER — METOPROLOL TARTRATE 25 MG PO TABS: ORAL_TABLET | ORAL | 2 refills | Status: DC

## 2022-01-12 NOTE — Telephone Encounter (Signed)
Pt's medication was sent to pt's pharmacy as requested. Confirmation received.  °

## 2022-01-12 NOTE — Telephone Encounter (Signed)
°*  STAT* If patient is at the pharmacy, call can be transferred to refill team.   1. Which medications need to be refilled? (please list name of each medication and dose if known) metoprolol tartrate (LOPRESSOR) 25 MG tablet   2. Which pharmacy/location (including street and city if local pharmacy) is medication to be sent to?[place], [place]  Phone:  [phone] Fax:  [phone]   3. Do they need a 30 day or 90 day supply? 30 ds

## 2022-01-15 ENCOUNTER — Ambulatory Visit (HOSPITAL_COMMUNITY): Payer: Medicare Other

## 2022-01-15 DIAGNOSIS — N3020 Other chronic cystitis without hematuria: Secondary | ICD-10-CM | POA: Diagnosis not present

## 2022-01-19 ENCOUNTER — Ambulatory Visit (HOSPITAL_COMMUNITY)
Admission: RE | Admit: 2022-01-19 | Discharge: 2022-01-19 | Disposition: A | Discharge status: Home / Self Care | Payer: Medicare Other | Source: Ambulatory Visit | Attending: Hematology | Admitting: Hematology

## 2022-01-19 ENCOUNTER — Other Ambulatory Visit: Payer: Self-pay | Admitting: Cardiology

## 2022-01-19 ENCOUNTER — Other Ambulatory Visit: Payer: Self-pay

## 2022-01-19 DIAGNOSIS — C182 Malignant neoplasm of ascending colon: Secondary | ICD-10-CM | POA: Insufficient documentation

## 2022-01-19 DIAGNOSIS — R918 Other nonspecific abnormal finding of lung field: Secondary | ICD-10-CM | POA: Diagnosis not present

## 2022-01-19 DIAGNOSIS — K5730 Diverticulosis of large intestine without perforation or abscess without bleeding: Secondary | ICD-10-CM | POA: Diagnosis not present

## 2022-01-20 ENCOUNTER — Other Ambulatory Visit: Payer: Self-pay | Admitting: *Deleted

## 2022-01-20 ENCOUNTER — Telehealth: Payer: Self-pay | Admitting: Cardiology

## 2022-01-20 MED ORDER — CLOPIDOGREL BISULFATE 75 MG PO TABS: ORAL_TABLET | ORAL | 0 refills | Status: DC

## 2022-01-20 MED ORDER — ATORVASTATIN CALCIUM 40 MG PO TABS: 40.0000 mg | ORAL_TABLET | Freq: Every day | ORAL | 0 refills | Status: DC

## 2022-01-20 NOTE — Telephone Encounter (Signed)
°*  STAT* If patient is at the pharmacy, call can be transferred to refill team.   1. Which medications need to be refilled? (please list name of each medication and dose if known) clopidogrel (PLAVIX) 75 MG tablet  atorvastatin (LIPITOR) 40 MG tablet  2. Which pharmacy/location (including street and city if local pharmacy) is medication to be sent to? [place], [address]  3. Do they need a 30 day or 90 day supply? [place]

## 2022-01-21 ENCOUNTER — Inpatient Hospital Stay (HOSPITAL_COMMUNITY): Payer: Medicare Other | Admitting: Hematology

## 2022-01-21 ENCOUNTER — Other Ambulatory Visit: Payer: Self-pay

## 2022-01-21 ENCOUNTER — Encounter (HOSPITAL_COMMUNITY): Payer: Self-pay

## 2022-01-21 ENCOUNTER — Inpatient Hospital Stay (HOSPITAL_COMMUNITY): Payer: Medicare Other

## 2022-01-21 ENCOUNTER — Inpatient Hospital Stay (HOSPITAL_COMMUNITY): Payer: Medicare Other | Attending: Hematology

## 2022-01-21 VITALS — BP 145/65 | HR 70 | Temp 98.6°F | Resp 18

## 2022-01-21 VITALS — BP 139/68 | HR 81 | Temp 99.0°F | Resp 18 | Ht 61.42 in | Wt 157.8 lb

## 2022-01-21 DIAGNOSIS — C50911 Malignant neoplasm of unspecified site of right female breast: Secondary | ICD-10-CM | POA: Diagnosis not present

## 2022-01-21 DIAGNOSIS — C182 Malignant neoplasm of ascending colon: Secondary | ICD-10-CM | POA: Insufficient documentation

## 2022-01-21 DIAGNOSIS — C679 Malignant neoplasm of bladder, unspecified: Secondary | ICD-10-CM | POA: Insufficient documentation

## 2022-01-21 DIAGNOSIS — R7989 Other specified abnormal findings of blood chemistry: Secondary | ICD-10-CM

## 2022-01-21 DIAGNOSIS — Z79899 Other long term (current) drug therapy: Secondary | ICD-10-CM | POA: Diagnosis not present

## 2022-01-21 DIAGNOSIS — Z5112 Encounter for antineoplastic immunotherapy: Secondary | ICD-10-CM | POA: Insufficient documentation

## 2022-01-21 DIAGNOSIS — Z170 Estrogen receptor positive status [ER+]: Secondary | ICD-10-CM | POA: Insufficient documentation

## 2022-01-21 LAB — COMPREHENSIVE METABOLIC PANEL
ALT: 10 U/L (ref 0–44)
AST: 15 U/L (ref 15–41)
Albumin: 3.7 g/dL (ref 3.5–5.0)
Alkaline Phosphatase: 73 U/L (ref 38–126)
Anion gap: 8 (ref 5–15)
BUN: 23 mg/dL (ref 8–23)
CO2: 24 mmol/L (ref 22–32)
Calcium: 9.3 mg/dL (ref 8.9–10.3)
Chloride: 104 mmol/L (ref 98–111)
Creatinine, Ser: 1.77 mg/dL — ABNORMAL HIGH (ref 0.44–1.00)
GFR, Estimated: 28 mL/min — ABNORMAL LOW (ref >60)
Glucose, Bld: 117 mg/dL — ABNORMAL HIGH (ref 70–99)
Potassium: 3.8 mmol/L (ref 3.5–5.1)
Sodium: 136 mmol/L (ref 135–145)
Total Bilirubin: 0.7 mg/dL (ref 0.3–1.2)
Total Protein: 7.2 g/dL (ref 6.5–8.1)

## 2022-01-21 LAB — CBC WITH DIFFERENTIAL/PLATELET
Abs Immature Granulocytes: 0.03 10*3/uL (ref 0.00–0.07)
Basophils Absolute: 0.1 10*3/uL (ref 0.0–0.1)
Basophils Relative: 1 %
Eosinophils Absolute: 0.1 10*3/uL (ref 0.0–0.5)
Eosinophils Relative: 2 %
HCT: 39.1 % (ref 36.0–46.0)
Hemoglobin: 11.7 g/dL — ABNORMAL LOW (ref 12.0–15.0)
Immature Granulocytes: 1 %
Lymphocytes Relative: 11 %
Lymphs Abs: 0.6 10*3/uL — ABNORMAL LOW (ref 0.7–4.0)
MCH: 26.7 pg (ref 26.0–34.0)
MCHC: 29.9 g/dL — ABNORMAL LOW (ref 30.0–36.0)
MCV: 89.3 fL (ref 80.0–100.0)
Monocytes Absolute: 0.5 10*3/uL (ref 0.1–1.0)
Monocytes Relative: 10 %
Neutro Abs: 4.1 10*3/uL (ref 1.7–7.7)
Neutrophils Relative %: 75 %
Platelets: 212 10*3/uL (ref 150–400)
RBC: 4.38 MIL/uL (ref 3.87–5.11)
RDW: 14.9 % (ref 11.5–15.5)
WBC: 5.4 10*3/uL (ref 4.0–10.5)
nRBC: 0 % (ref 0.0–0.2)

## 2022-01-21 LAB — MAGNESIUM: Magnesium: 1.9 mg/dL (ref 1.7–2.4)

## 2022-01-21 LAB — TSH: TSH: 2.274 u[IU]/mL (ref 0.350–4.500)

## 2022-01-21 MED ORDER — SODIUM CHLORIDE 0.9 % IV SOLN
200.0000 mg | Freq: Once | INTRAVENOUS | Status: AC
  Administered 2022-01-21: 200 mg via INTRAVENOUS
  Filled 2022-01-21: qty 8

## 2022-01-21 MED ORDER — SODIUM CHLORIDE 0.9 % IV SOLN: Freq: Once | INTRAVENOUS | Status: AC

## 2022-01-21 MED ORDER — HEPARIN SOD (PORK) LOCK FLUSH 100 UNIT/ML IV SOLN
500.0000 [IU] | Freq: Once | INTRAVENOUS | Status: AC | PRN
  Administered 2022-01-21: 500 [IU]

## 2022-01-21 MED ORDER — SODIUM CHLORIDE 0.9% FLUSH
10.0000 mL | INTRAVENOUS | Status: DC | PRN
  Administered 2022-01-21 (×2): 10 mL

## 2022-01-21 MED ORDER — SODIUM CHLORIDE 0.9 % IV SOLN: INTRAVENOUS | Status: DC

## 2022-01-21 NOTE — Patient Instructions (Signed)
[place] at [facility] ?Discharge Instructions ? ? ?You were seen and examined today by Dr. Delton Coombes. ? ?He reviewed the results of your lab work which is normal/stable. ? ?We will proceed with your treatment today. ? ?Return as scheduled for lab work, treatment, and office visit. ? ? ?Thank you for choosing [place] at [facility] to provide your oncology and hematology care.  To afford each patient quality time with our provider, please arrive at least 15 minutes before your scheduled appointment time.  ? ?If you have a lab appointment with the [facility] please come in thru the Main Entrance and check in at the main information desk. ? ?You need to re-schedule your appointment should you arrive 10 or more minutes late.  We strive to give you quality time with our providers, and arriving late affects you and other patients whose appointments are after yours.  Also, if you no show three or more times for appointments you may be dismissed from the clinic at the providers discretion.     ?Again, thank you for choosing [facility].  Our hope is that these requests will decrease the amount of time that you wait before being seen by our physicians.       ?_____________________________________________________________ ? ?Should you have questions after your visit to [facility], please contact our office at [phone] and follow the prompts.  Our office hours are 8:00 a.m. and 4:30 p.m. Monday - Friday.  Please note that voicemails left after 4:00 p.m. may not be returned until the following business day.  We are closed weekends and major holidays.  You do have access to a nurse 24-7, just call the main number to the clinic [phone] and do not press any options, hold on the line and a nurse will answer the phone.   ? ?For prescription refill requests, have your pharmacy contact our office and allow 72 hours.   ? ?Due to Covid, you  will need to wear a mask upon entering the hospital. If you do not have a mask, a mask will be given to you at the Main Entrance upon arrival. For doctor visits, patients may have 1 support person age 36 or older with them. For treatment visits, patients can not have anyone with them due to social distancing guidelines and our immunocompromised population.  ? ?   ?

## 2022-01-21 NOTE — Patient Instructions (Signed)
[facility]  Discharge Instructions: ?Thank you for choosing [facility] to provide your oncology and hematology care.  ?If you have a lab appointment with the Cancer Center, please come in thru the Main Entrance and check in at the main information desk. ? ?Wear comfortable clothing and clothing appropriate for easy access to any Portacath or PICC line.  ? ?We strive to give you quality time with your provider. You may need to reschedule your appointment if you arrive late (15 or more minutes).  Arriving late affects you and other patients whose appointments are after yours.  Also, if you miss three or more appointments without notifying the office, you may be dismissed from the clinic at the provider?s discretion.    ?  ?For prescription refill requests, have your pharmacy contact our office and allow 72 hours for refills to be completed.   ? ?Today you received the following chemotherapy and/or immunotherapy agents Keytruda, return as scheduled.  ?  ?To help prevent nausea and vomiting after your treatment, we encourage you to take your nausea medication as directed. ? ?BELOW ARE SYMPTOMS THAT SHOULD BE REPORTED IMMEDIATELY: ?*FEVER GREATER THAN 100.4 F (38 ?C) OR HIGHER ?*CHILLS OR SWEATING ?*NAUSEA AND VOMITING THAT IS NOT CONTROLLED WITH YOUR NAUSEA MEDICATION ?*UNUSUAL SHORTNESS OF BREATH ?*UNUSUAL BRUISING OR BLEEDING ?*URINARY PROBLEMS (pain or burning when urinating, or frequent urination) ?*BOWEL PROBLEMS (unusual diarrhea, constipation, pain near the anus) ?TENDERNESS IN MOUTH AND THROAT WITH OR WITHOUT PRESENCE OF ULCERS (sore throat, sores in mouth, or a toothache) ?UNUSUAL RASH, SWELLING OR PAIN  ?UNUSUAL VAGINAL DISCHARGE OR ITCHING  ? ?Items with * indicate a potential emergency and should be followed up as soon as possible or go to the Emergency Department if any problems should occur. ? ?Please show the CHEMOTHERAPY ALERT CARD or IMMUNOTHERAPY ALERT CARD at check-in to  the Emergency Department and triage nurse. ? ?Should you have questions after your visit or need to cancel or reschedule your appointment, please contact [facility] [phone]  and follow the prompts.  Office hours are 8:00 a.m. to 4:30 p.m. Monday - Friday. Please note that voicemails left after 4:00 p.m. may not be returned until the following business day.  We are closed weekends and major holidays. You have access to a nurse at all times for urgent questions. Please call the main number to the clinic [phone] and follow the prompts. ? ?For any non-urgent questions, you may also contact your provider using MyChart. We now offer e-Visits for anyone 18 and older to request care online for non-urgent symptoms. For details visit [email or web address]. ?  ?Also download the MyChart app! Go to the app store, search "MyChart", open the app, select [facility], and log in with your MyChart username and password. ? ?Due to Covid, a mask is required upon entering the hospital/clinic. If you do not have a mask, one will be given to you upon arrival. For doctor visits, patients may have 1 support person aged 18 or older with them. For treatment visits, patients cannot have anyone with them due to current Covid guidelines and our immunocompromised population.  ?

## 2022-01-21 NOTE — Progress Notes (Signed)
Patient presents today for Keytruda. Patient's creatinine 1.77, patient okay for treatment today per Dr. Delton Coombes with an additional order of 525ml ordered. Patient tolerated therapy with no complaints voiced. Side effects with management reviewed with understanding verbalized. Port site clean and dry with no bruising or swelling noted at site. Good blood return noted before and after administration of therapy. Band aid applied. Patient left in satisfactory condition with VSS and no s/s of distress noted.  ?

## 2022-01-21 NOTE — Progress Notes (Signed)
[place], [place] [address]   CLINIC:  Medical Oncology/Hematology  PCP:  Antony Contras, MD [address] / [place] [address] [phone]   REASON FOR VISIT:  Follow-up for stage I right breast cancer and colon cancer  PRIOR THERAPY:  1. Robotic proximal colectomy on 10/11/2019. 2. Right breast lumpectomy and SLNB on 04/02/2020  NGS Results: MSI--high, Foundation 1 not sent  CURRENT THERAPY: Keytruda every 3 weeks  BRIEF ONCOLOGIC HISTORY:  Oncology History  Cancer of ascending colon s/p robotic proximal colectomy 10/11/2019  10/11/2019 Initial Diagnosis   Cancer of ascending colon s/p robotic proximal colectomy 10/11/2019   10/23/2019 Cancer Staging   Staging form: Colon and Rectum, AJCC 8th Edition - Clinical stage from 10/23/2019: Stage IVA (cT3, cN1c, cM1a) - Signed by Derek Jack, MD on 12/14/2019    05/14/2020 -  Chemotherapy   Patient is on Treatment Plan : COLORECTAL Pembrolizumab q21d     Malignant neoplasm of right female breast ([facility])  04/23/2020 Initial Diagnosis   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 [facility])   04/23/2020 Cancer Staging   Staging form: Breast, AJCC 8th Edition - Clinical stage from 04/23/2020: Stage IA (cT1c, cN0(sn), cM0, G2, ER+, PR+, HER2-) - Signed by Derek Jack, MD on 04/23/2020      CANCER STAGING:  Cancer Staging  Cancer of ascending colon s/p robotic proximal colectomy 10/11/2019 Staging form: Colon and Rectum, AJCC 8th Edition - Clinical stage from 10/23/2019: Stage IVA (cT3, cN1c, cM1a) - Signed by Derek Jack, MD on 12/14/2019  Malignant neoplasm of right female breast [facility]) Staging form: Breast, AJCC 8th Edition - Clinical stage from 04/23/2020: Stage IA (cT1c, cN0(sn), cM0, G2, ER+, PR+, HER2-) - Signed by Derek Jack, MD on 04/23/2020   INTERVAL HISTORY:  Ms. EARLY ORD, a 81 y.o. female, returns for routine  follow-up and consideration for next cycle of chemotherapy. Marialuisa was last seen on 12/09/2021.  Due for cycle #29 of Keytruda today.   Overall, she tells me she has been feeling pretty well. She reports n/v/d while taking ampicillin. She reports her itching is stable. She reports occasional rash. She denies dry cough and ankle swelling.   Overall, she feels ready for next cycle of chemo today.   REVIEW OF SYSTEMS:  Review of Systems  Constitutional:  Negative for appetite change and fatigue.  Respiratory:  Positive for shortness of breath. Negative for cough.   Cardiovascular:  Negative for leg swelling.  Gastrointestinal:  Positive for diarrhea, nausea and vomiting.  Skin:  Positive for itching (stable) and rash (occ).  All other systems reviewed and are negative.  PAST MEDICAL/SURGICAL HISTORY:  Past Medical History:  Diagnosis Date   Abdominal aortic aneurysm    Acid reflux    Anemia    Arthritis    knees , R shoulder - tx /w injection - 11/2014   Basal cell carcinoma (BCC) of dorsum of nose 2010   Resolved   Cancer ([facility])    basal cell on nose   Colon cancer ([facility])    Coronary artery disease    Hypercholesteremia    Hypertension    Hypothyroidism    Lt Acute pyelonephritis 09/11/2018   MI, old 2000   Peripheral arterial disease ([facility])    hhigh-grade ostial bilateral calcified iliac stenosis with claudication   Tobacco abuse    Vertigo    when lays on left side.   Past Surgical History:  Procedure Laterality Date   BREAST LUMPECTOMY WITH RADIOACTIVE SEED AND SENTINEL LYMPH NODE BIOPSY Bilateral 04/02/2020   Procedure: BILATERAL BREAST LUMPECTOMY WITH RADIOACTIVE SEED AND RIGHT SENTINEL LYMPH NODE BIOPSY AND RIGHT TARGETED AXILLARY LYMPH NODE BIOPSY;  Surgeon: Erroll Luna, MD;  Location: [facility];  Service: General;  Laterality: Bilateral;   CARDIAC CATHETERIZATION  5 stents   CARDIAC CATHETERIZATION N/A 01/20/2016   Procedure: Left Heart Cath and  Coronary Angiography;  Surgeon: Peter M Martinique, MD;  Location: [facility];  Service: Cardiovascular;  Laterality: N/A;   CATARACT EXTRACTION W/PHACO Left 05/24/2014   Procedure: CATARACT EXTRACTION PHACO AND INTRAOCULAR LENS PLACEMENT (IOC);  Surgeon: Tonny Branch, MD;  Location: [facility];  Service: Ophthalmology;  Laterality: Left;  CDE:  9.30   CATARACT EXTRACTION W/PHACO Right 06/18/2014   Procedure: CATARACT EXTRACTION PHACO AND INTRAOCULAR LENS PLACEMENT RIGHT EYE CDE=10.84;  Surgeon: Tonny Branch, MD;  Location: [facility];  Service: Ophthalmology;  Laterality: Right;   CORONARY ARTERY BYPASS GRAFT N/A 01/24/2016   Procedure: CORONARY ARTERY BYPASS GRAFTING (CABG);  Surgeon: Gaye Pollack, MD;  Location: [facility];  Service: Open Heart Surgery;  Laterality: N/A;   CORONARY STENT PLACEMENT     CORONARY STENT PLACEMENT  03/06/15   CFX DES   CYSTOSCOPY/URETEROSCOPY/HOLMIUM LASER/STENT PLACEMENT Left 09/12/2018   Procedure: CYSTOSCOPY/URETEROSCOPY/STENT PLACEMENT;  Surgeon: Ceasar Mons, MD;  Location: [facility];  Service: Urology;  Laterality: Left;   EYE SURGERY     LEFT HEART CATH AND CORS/GRAFTS ANGIOGRAPHY N/A 09/12/2019   Procedure: LEFT HEART CATH AND CORS/GRAFTS ANGIOGRAPHY;  Surgeon: Troy Sine, MD;  Location: [facility];  Service: Cardiovascular;  Laterality: N/A;   LEFT HEART CATHETERIZATION WITH CORONARY ANGIOGRAM N/A 03/06/2015   Procedure: LEFT HEART CATHETERIZATION WITH CORONARY ANGIOGRAM;  Surgeon: Lorretta Harp, MD;  Location: [facility] C[facility];  Service: Cardiovascular;  Laterality: N/A;   PARTIAL KNEE ARTHROPLASTY Right 02/04/2015   Procedure: UNICOMPARTMENTAL KNEE;  Surgeon: Dorna Leitz, MD;  Location: [facility];  Service: Orthopedics;  Laterality: Right;   PERIPHERAL VASCULAR CATHETERIZATION Bilateral 05/13/2015   Procedure: Lower Extremity Angiography;  Surgeon: Lorretta Harp, MD;  Location: [facility];  Service: Cardiovascular;  Laterality: Bilateral;    PERIPHERAL VASCULAR CATHETERIZATION N/A 05/13/2015   Procedure: Abdominal Aortogram;  Surgeon: Lorretta Harp, MD;  Location: [facility];  Service: Cardiovascular;  Laterality: N/A;   PERIPHERAL VASCULAR CATHETERIZATION Bilateral 06/27/2015   Procedure: Peripheral Vascular Intervention;  Surgeon: Lorretta Harp, MD;  Location: [facility];  Service: Cardiovascular;  Laterality: Bilateral;  ILIACS   PERIPHERAL VASCULAR CATHETERIZATION Bilateral 06/27/2015   Procedure: Peripheral Vascular Atherectomy;  Surgeon: Lorretta Harp, MD;  Location: [facility];  Service: Cardiovascular;  Laterality: Bilateral;   PORTACATH PLACEMENT Left 05/24/2020   Procedure: INSERTION PORT-A-CATH;  Surgeon: Aviva Signs, MD;  Location: [facility];  Service: General;  Laterality: Left;   TEE WITHOUT CARDIOVERSION N/A 01/24/2016   Procedure: TRANSESOPHAGEAL ECHOCARDIOGRAM (TEE);  Surgeon: Gaye Pollack, MD;  Location: [facility];  Service: Open Heart Surgery;  Laterality: N/A;   TONSILLECTOMY     age 42   TUBAL LIGATION      SOCIAL HISTORY:  Social History   Socioeconomic History   Marital status: Married    Spouse name: Tommy   Number of children: 4   Years of education: Not on file   Highest education level: Not on file  Occupational  History   Occupation: retired    Comment: worked as Presenter, broadcasting for [employer]  Tobacco Use   Smoking status: Former    Packs/day: 1.50    Years: 42.00    Pack years: 63.00    Types: Cigarettes    Quit date: 05/19/1999    Years since quitting: 22.6   Smokeless tobacco: Never  Vaping Use   Vaping Use: Never used  Substance and Sexual Activity   Alcohol use: No   Drug use: No   Sexual activity: Yes    Birth control/protection: Surgical  Other Topics Concern   Not on file  Social History Narrative   Not on file   Social Determinants of Health   Financial Resource Strain: Medium Risk   Difficulty of Paying Living Expenses: Somewhat hard   Food Insecurity: No Food Insecurity   Worried About Charity fundraiser in the Last Year: Never true   Ran Out of Food in the Last Year: Never true  Transportation Needs: No Transportation Needs   Lack of Transportation (Medical): No   Lack of Transportation (Non-Medical): No  Physical Activity: Inactive   Days of Exercise per Week: 0 days   Minutes of Exercise per Session: 0 min  Stress: No Stress Concern Present   Feeling of Stress : Not at all  Social Connections: Moderately Isolated   Frequency of Communication with Friends and Family: More than three times a week   Frequency of Social Gatherings with Friends and Family: Three times a week   Attends Religious Services: Never   Active Member of Clubs or Organizations: No   Attends Archivist Meetings: Never   Marital Status: Married  Human resources officer Violence: Not At Risk   Fear of Current or Ex-Partner: No   Emotionally Abused: No   Physically Abused: No   Sexually Abused: No    FAMILY HISTORY:  Family History  Problem Relation Age of Onset   Ovarian cancer Mother 32   Cancer Father 31       unsure of which kind, "it was in his glands"   Hypertension Maternal Grandmother    Stroke Maternal Grandfather    Hypertension Son    Brain cancer Sister    Hypertension Daughter    Heart attack Neg Hx    Colon cancer Neg Hx    Esophageal cancer Neg Hx    Inflammatory bowel disease Neg Hx    Liver disease Neg Hx    Pancreatic cancer Neg Hx    Rectal cancer Neg Hx    Stomach cancer Neg Hx     CURRENT MEDICATIONS:  Current Outpatient Medications  Medication Sig Dispense Refill   amLODipine (NORVASC) 5 MG tablet TAKE 1 TABLET BY MOUTH ONCE A DAY. 30 tablet 6   ampicillin (PRINCIPEN) 500 MG capsule Take 500 mg by mouth 3 (three) times daily.     anastrozole (ARIMIDEX) 1 MG tablet TAKE 1 TABLET BY MOUTH ONCE DAILY. 30 tablet 6   aspirin EC 81 MG tablet Take 81 mg by mouth every evening.      atorvastatin (LIPITOR)  40 MG tablet Take 1 tablet (40 mg total) by mouth daily. Please keep upcoming appointment for future refills. Thank you. 90 tablet 0   clopidogrel (PLAVIX) 75 MG tablet Please keep upcoming appointment for future refills. Thank you 90 tablet 0   esomeprazole (NEXIUM) 20 MG capsule Take 20 mg by mouth daily.      HYDROcodone-acetaminophen (NORCO/VICODIN) 5-325 MG tablet  Take 1 tablet by mouth daily.     hydrOXYzine (ATARAX/VISTARIL) 25 MG tablet TAKE (1) TABLET BY MOUTH AT BEDTIME AS NEEDED 30 tablet 6   levothyroxine (SYNTHROID) 25 MCG tablet Take 25 mcg by mouth daily.     levothyroxine (SYNTHROID) 50 MCG tablet Take 50 mcg by mouth daily.     lidocaine-prilocaine (EMLA) cream Apply to affected area once 30 g 3   metoprolol tartrate (LOPRESSOR) 25 MG tablet TAKE 1 TABLET BY MOUTH TWICE DAILY. Please keep upcoming appt in May 2023 with Dr. Marlou Porch before anymore refills. Thank you Final Attempt 30 tablet 2   nitroGLYCERIN (NITROSTAT) 0.4 MG SL tablet Place 1 tablet (0.4 mg total) under the tongue every 5 (five) minutes as needed for chest pain. 25 tablet prn   Pembrolizumab (KEYTRUDA IV) Inject 200 mg into the vein every 21 ( twenty-one) days.      pembrolizumab (KEYTRUDA) 100 MG/4ML SOLN See admin instructions.     prochlorperazine (COMPAZINE) 10 MG tablet Take 1 tablet (10 mg total) by mouth every 6 (six) hours as needed (Nausea or vomiting). 30 tablet 1   Vitamin D, Ergocalciferol, (DRISDOL) 1.25 MG (50000 UNIT) CAPS capsule TAKE 1 CAPSULE BY MOUTH ONCE A WEEK. 4 capsule 3   No current facility-administered medications for this visit.    ALLERGIES:  Allergies  Allergen Reactions   Crab [Shellfish Allergy] Nausea And Vomiting    Throws up violently   Ezetimibe Diarrhea   Other Nausea And Vomiting    Shrimp    PHYSICAL EXAM:  Performance status (ECOG): 1 - Symptomatic but completely ambulatory  Vitals:   01/21/22 1220  BP: 139/68  Pulse: 81  Resp: 18  Temp: 99 F (37.2 C)  SpO2:  100%   Wt Readings from Last 3 Encounters:  01/21/22 157 lb 13.6 oz (71.6 kg)  12/30/21 161 lb (73 kg)  12/09/21 162 lb 8 oz (73.7 kg)   Physical Exam Vitals reviewed.  Constitutional:      Appearance: Normal appearance.  Cardiovascular:     Rate and Rhythm: Normal rate and regular rhythm.     Pulses: Normal pulses.     Heart sounds: Normal heart sounds.  Pulmonary:     Effort: Pulmonary effort is normal.     Breath sounds: Normal breath sounds.  Lymphadenopathy:     Upper Body:     Right upper body: No supraclavicular or axillary adenopathy.     Left upper body: No supraclavicular or axillary adenopathy.  Neurological:     General: No focal deficit present.     Mental Status: She is alert and oriented to person, place, and time.  Psychiatric:        Mood and Affect: Mood normal.        Behavior: Behavior normal.    LABORATORY DATA:  I have reviewed the labs as listed.  CBC Latest Ref Rng & Units 01/21/2022 12/30/2021 12/09/2021  WBC 4.0 - 10.5 K/uL 5.4 6.6 6.6  Hemoglobin 12.0 - 15.0 g/dL 11.7(L) 12.0 12.0  Hematocrit 36.0 - 46.0 % 39.1 39.0 38.3  Platelets 150 - 400 K/uL 212 190 203   CMP Latest Ref Rng & Units 12/30/2021 12/09/2021 11/18/2021  Glucose 70 - 99 mg/dL 104(H) 122(H) 117(H)  BUN 8 - 23 mg/dL _0 Creatinine 0.44 - 1.00 mg/dL 1.51(H) 1.54(H) 1.49(H)  Sodium 135 - 145 mmol/L 137 140 135  Potassium 3.5 - 5.1 mmol/L 3.6 3.6 3.5  Chloride 98 - 111  mmol/L 105 106 104  CO2 22 - 32 mmol/L _0 Calcium 8.9 - 10.3 mg/dL 9.4 9.2 9.1  Total Protein 6.5 - 8.1 g/dL 6.6 6.5 6.5  Total Bilirubin 0.3 - 1.2 mg/dL 0.7 0.8 0.6  Alkaline Phos 38 - 126 U/L 81 80 89  AST 15 - 41 U/L _1 ALT 0 - 44 U/L _2 DIAGNOSTIC IMAGING:  I have independently reviewed the scans and discussed with the patient. CT CHEST ABDOMEN PELVIS WO CONTRAST  Result Date: 01/19/2022 CLINICAL DATA:  History of colon cancer and breast cancer. Surveillance. EXAM: CT CHEST, ABDOMEN  AND PELVIS WITHOUT CONTRAST TECHNIQUE: Multidetector CT imaging of the chest, abdomen and pelvis was performed following the standard protocol without IV contrast. RADIATION DOSE REDUCTION: This exam was performed according to the departmental dose-optimization program which includes automated exposure control, adjustment of the mA and/or kV according to patient size and/or use of iterative reconstruction technique. COMPARISON:  Multiple priors including most recent CT August 08, 2021. FINDINGS: CT CHEST FINDINGS Cardiovascular: Left chest Port-A-Cath with tip in the proximal SVC. Aortic and branch vessel atherosclerosis. Coronary artery calcifications. Prior CABG. Lipomatous hypertrophy of the intra-atrial septum. Normal size heart. No significant pericardial effusion/thickening. Mediastinum/Nodes: No supraclavicular adenopathy. No discrete thyroid nodule. No pathologically enlarged mediastinal, hilar or axillary lymph nodes, noting limited sensitivity for the detection of hilar adenopathy on this noncontrast study. Small hiatal hernia. Lungs/Pleura: Subpleural reticulations and ground-glass opacities in the bilateral lower lobes similar prior. 4 mm left lower lobe pulmonary nodule on image 72/5 is stable dating back to Apr 18, 2021 in retrospect measured 3 mm on CT August 08 2019. No new suspicious pulmonary nodules or masses. No pleural effusion or pneumothorax. Musculoskeletal: Similar postsurgical change of right breast lumpectomy. Prior median sternotomy. No aggressive lytic or blastic lesion of bone. CT ABDOMEN PELVIS FINDINGS Hepatobiliary: Hypodense 11 mm cyst in the anterior liver on image 50/3 is stable dating back to at least August 08, 2019. Gallbladder is unremarkable. No biliary ductal dilation. Pancreas: Pancreatic atrophy similar prior. No pancreatic ductal dilation or evidence of acute inflammation. Spleen: Normal size spleen without focal splenic lesion. Adrenals/Urinary Tract: Bilateral  adrenal glands appear normal. No hydronephrosis. Nonobstructive left renal stones. Urinary bladder is unremarkable for degree of distension. Stomach/Bowel: No enteric contrast was administered. Small hiatal hernia otherwise the stomach is unremarkable for degree of distension. No pathologic dilation small or large bowel. Prior partial right hemicolectomy with ileocolonic anastomosis. Extensive sigmoid colonic diverticulosis without findings of acute diverticulitis. Vascular/Lymphatic: Aortic and branch vessel atherosclerosis. Similar aneurysmal dilation of the infrarenal abdominal aorta measuring 3.2 cm. Bilateral common iliac stents. Reproductive: Uterus and bilateral adnexa are unremarkable. Other: No significant abdominopelvic free fluid. No pneumoperitoneum. No discrete peritoneal or omental nodularity. Musculoskeletal: Multilevel degenerative changes spine. Degenerative change of the hips. No aggressive lytic or blastic lesion of bone. IMPRESSION: 1. Similar postsurgical changes of right breast lumpectomy and right hemicolectomy with reanastomosis. 2. No definite evidence of metastatic disease within the chest, abdomen, or pelvis. 3. Extensive sigmoid colonic diverticulosis without findings of acute diverticulitis. 4. Similar aneurysmal dilation of the infrarenal abdominal aorta measuring 3.2 cm. Recommend follow-up every 3 years. Reference: J Am Coll Radiol 8341;96:222-979. 5. Nonobstructive left nephrolithiasis. 6.  Aortic Atherosclerosis (ICD10-I70.0). Electronically Signed   By: Dahlia Bailiff M.D.   On: 01/19/2022 15:02     ASSESSMENT:  1.  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  Right hemicolectomy on 10/11/2019, grade 3 adenocarcinoma, negative margins, positive LVSI, 2 tumor deposits, 0/15 lymph nodes positive, PT3PN1C. 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  MLH1 hyper methylation  present. -PET scan in December 2020 showed lymph node in the right axillary region.  Biopsy consistent with metastatic colon cancer. -Last CEA was 10.7 on 11/22/2019. -Right axillary lymph node excision on 04/02/2020 consistent with metastatic carcinoma from colon cancer. -Pembrolizumab started on 05/14/2020. -PET scan on 08/05/2020 shows complete resolution of lymphadenopathy.  No evidence of new areas of uptake.  Mild vague residual hypermetabolism in the right axilla without soft tissue mass. -PET scan on 12/23/2020 with no evidence of recurrence.   2.  Stage I (PT1CPN0) right Breast, Grade 2 Invasive Lobular Carcinoma: -MRI of the breast on 12/25/2019 showed 1 cm mass behind the right areola with a suspicious mass in the left breast. -Right breast lumpectomy on 04/02/2020 shows invasive lobular carcinoma, grade 2, 1.2 cm.  Resection margins are negative.  Negative LVSI.  2 sentinel lymph nodes were negative for carcinoma.  ER/PR 100% positive, HER-2 negative, Ki-67 10%.   PLAN:  1.  Stage IV colon cancer to the right axillary lymph node: - She does not report any immunotherapy related side effects. - We reviewed CT CAP from 01/19/2022 which did not show any evidence of metastatic disease in the chest, abdomen or pelvis.  Other noncancerous findings were discussed. - We will proceed with Keytruda today and in 3 weeks.  RTC 6 weeks for follow-up. - I have reviewed her labs today which shows normal LFTs and CBC.  TSH was 2.2.   2.  Right breast invasive lobular carcinoma, grade 2: - Continue anastrozole daily.  No side effects. - We will order her mammogram.   3.  Osteoporosis: - Continue vitamin D 50,000 units weekly.   4.  Bladder cancer: - Continue follow-up with urology at [facility]. - Recently passed a kidney stone on Sunday, treated with ampicillin.   5.  Generalized pruritus: - Continue Atarax as needed.   Orders placed this encounter:  No orders of the defined types were placed  in this encounter.    Derek Jack, MD [place] [phone]   I, Thana Ates, am acting as a scribe for Dr. Derek Jack.  I, Derek Jack MD, have reviewed the above documentation for accuracy and completeness, and I agree with the above.

## 2022-01-21 NOTE — Progress Notes (Signed)
Patient has been examined by Dr. Katragadda, and vital signs and labs have been reviewed. ANC, Creatinine, LFTs, hemoglobin, and platelets are within treatment parameters per M.D. - pt may proceed with treatment.    °

## 2022-02-03 ENCOUNTER — Other Ambulatory Visit (HOSPITAL_COMMUNITY): Payer: Self-pay | Admitting: Hematology

## 2022-02-11 ENCOUNTER — Inpatient Hospital Stay (HOSPITAL_COMMUNITY): Payer: Medicare Other

## 2022-02-11 ENCOUNTER — Encounter (HOSPITAL_COMMUNITY): Payer: Self-pay

## 2022-02-11 ENCOUNTER — Other Ambulatory Visit: Payer: Self-pay

## 2022-02-11 VITALS — BP 129/61 | HR 58 | Temp 97.9°F | Resp 18 | Ht 61.58 in | Wt 158.5 lb

## 2022-02-11 DIAGNOSIS — C50911 Malignant neoplasm of unspecified site of right female breast: Secondary | ICD-10-CM | POA: Diagnosis not present

## 2022-02-11 DIAGNOSIS — Z5112 Encounter for antineoplastic immunotherapy: Secondary | ICD-10-CM | POA: Diagnosis not present

## 2022-02-11 DIAGNOSIS — Z79899 Other long term (current) drug therapy: Secondary | ICD-10-CM | POA: Diagnosis not present

## 2022-02-11 DIAGNOSIS — Z170 Estrogen receptor positive status [ER+]: Secondary | ICD-10-CM | POA: Diagnosis not present

## 2022-02-11 DIAGNOSIS — C182 Malignant neoplasm of ascending colon: Secondary | ICD-10-CM | POA: Diagnosis not present

## 2022-02-11 DIAGNOSIS — R7989 Other specified abnormal findings of blood chemistry: Secondary | ICD-10-CM

## 2022-02-11 DIAGNOSIS — C679 Malignant neoplasm of bladder, unspecified: Secondary | ICD-10-CM | POA: Diagnosis not present

## 2022-02-11 LAB — COMPREHENSIVE METABOLIC PANEL
ALT: 9 U/L (ref 0–44)
AST: 16 U/L (ref 15–41)
Albumin: 3.7 g/dL (ref 3.5–5.0)
Alkaline Phosphatase: 91 U/L (ref 38–126)
Anion gap: 9 (ref 5–15)
BUN: 13 mg/dL (ref 8–23)
CO2: 23 mmol/L (ref 22–32)
Calcium: 9.3 mg/dL (ref 8.9–10.3)
Chloride: 108 mmol/L (ref 98–111)
Creatinine, Ser: 1.76 mg/dL — ABNORMAL HIGH (ref 0.44–1.00)
GFR, Estimated: 29 mL/min — ABNORMAL LOW (ref >60)
Glucose, Bld: 135 mg/dL — ABNORMAL HIGH (ref 70–99)
Potassium: 3.5 mmol/L (ref 3.5–5.1)
Sodium: 140 mmol/L (ref 135–145)
Total Bilirubin: 0.7 mg/dL (ref 0.3–1.2)
Total Protein: 6.6 g/dL (ref 6.5–8.1)

## 2022-02-11 LAB — TSH: TSH: 5.33 u[IU]/mL — ABNORMAL HIGH (ref 0.350–4.500)

## 2022-02-11 LAB — CBC WITH DIFFERENTIAL/PLATELET
Abs Immature Granulocytes: 0.03 10*3/uL (ref 0.00–0.07)
Basophils Absolute: 0.1 10*3/uL (ref 0.0–0.1)
Basophils Relative: 2 %
Eosinophils Absolute: 0 10*3/uL (ref 0.0–0.5)
Eosinophils Relative: 0 %
HCT: 37.1 % (ref 36.0–46.0)
Hemoglobin: 11.4 g/dL — ABNORMAL LOW (ref 12.0–15.0)
Immature Granulocytes: 1 %
Lymphocytes Relative: 16 %
Lymphs Abs: 1 10*3/uL (ref 0.7–4.0)
MCH: 27.1 pg (ref 26.0–34.0)
MCHC: 30.7 g/dL (ref 30.0–36.0)
MCV: 88.1 fL (ref 80.0–100.0)
Monocytes Absolute: 0.4 10*3/uL (ref 0.1–1.0)
Monocytes Relative: 6 %
Neutro Abs: 4.6 10*3/uL (ref 1.7–7.7)
Neutrophils Relative %: 75 %
Platelets: 215 10*3/uL (ref 150–400)
RBC: 4.21 MIL/uL (ref 3.87–5.11)
RDW: 15 % (ref 11.5–15.5)
WBC: 6.1 10*3/uL (ref 4.0–10.5)
nRBC: 0 % (ref 0.0–0.2)

## 2022-02-11 MED ORDER — SODIUM CHLORIDE 0.9% FLUSH
10.0000 mL | INTRAVENOUS | Status: DC | PRN
  Administered 2022-02-11: 10 mL

## 2022-02-11 MED ORDER — HEPARIN SOD (PORK) LOCK FLUSH 100 UNIT/ML IV SOLN
500.0000 [IU] | Freq: Once | INTRAVENOUS | Status: AC | PRN
  Administered 2022-02-11: 500 [IU]

## 2022-02-11 MED ORDER — SODIUM CHLORIDE 0.9 % IV SOLN
200.0000 mg | Freq: Once | INTRAVENOUS | Status: AC
  Administered 2022-02-11: 200 mg via INTRAVENOUS
  Filled 2022-02-11: qty 8

## 2022-02-11 MED ORDER — SODIUM CHLORIDE 0.9 % IV SOLN: Freq: Once | INTRAVENOUS | Status: AC

## 2022-02-11 MED ORDER — SODIUM CHLORIDE 0.9 % IV SOLN: INTRAVENOUS | Status: DC

## 2022-02-11 NOTE — Progress Notes (Signed)
Patient presents today for Lead, Ser. Creatinine 1.76, Dr. Delton Coombes made aware, patient okay for treatment today with an additional order for 526m of Normal Saline over 1 hour.  ?Patient tolerated therapy with no complaints voiced. Side effects with management reviewed with understanding verbalized. Port site clean and dry with no bruising or swelling noted at site. Good blood return noted before and after administration of therapy. Band aid applied. Patient left in satisfactory condition with VSS and no s/s of distress noted.  ?

## 2022-02-11 NOTE — Progress Notes (Signed)
Patients port flushed without difficulty.  Good blood return noted with no bruising or swelling noted at site.  Stable during access and blood draw.  Patient to remain accessed for treatment. 

## 2022-02-11 NOTE — Patient Instructions (Signed)
[facility]  Discharge Instructions: ?Thank you for choosing [facility] to provide your oncology and hematology care.  ?If you have a lab appointment with the Cancer Center, please come in thru the Main Entrance and check in at the main information desk. ? ?Wear comfortable clothing and clothing appropriate for easy access to any Portacath or PICC line.  ? ?We strive to give you quality time with your provider. You may need to reschedule your appointment if you arrive late (15 or more minutes).  Arriving late affects you and other patients whose appointments are after yours.  Also, if you miss three or more appointments without notifying the office, you may be dismissed from the clinic at the provider?s discretion.    ?  ?For prescription refill requests, have your pharmacy contact our office and allow 72 hours for refills to be completed.   ? ?Today you received the following chemotherapy and/or immunotherapy agents Keytruda, return as scheduled.  ?  ?To help prevent nausea and vomiting after your treatment, we encourage you to take your nausea medication as directed. ? ?BELOW ARE SYMPTOMS THAT SHOULD BE REPORTED IMMEDIATELY: ?*FEVER GREATER THAN 100.4 F (38 ?C) OR HIGHER ?*CHILLS OR SWEATING ?*NAUSEA AND VOMITING THAT IS NOT CONTROLLED WITH YOUR NAUSEA MEDICATION ?*UNUSUAL SHORTNESS OF BREATH ?*UNUSUAL BRUISING OR BLEEDING ?*URINARY PROBLEMS (pain or burning when urinating, or frequent urination) ?*BOWEL PROBLEMS (unusual diarrhea, constipation, pain near the anus) ?TENDERNESS IN MOUTH AND THROAT WITH OR WITHOUT PRESENCE OF ULCERS (sore throat, sores in mouth, or a toothache) ?UNUSUAL RASH, SWELLING OR PAIN  ?UNUSUAL VAGINAL DISCHARGE OR ITCHING  ? ?Items with * indicate a potential emergency and should be followed up as soon as possible or go to the Emergency Department if any problems should occur. ? ?Please show the CHEMOTHERAPY ALERT CARD or IMMUNOTHERAPY ALERT CARD at check-in to  the Emergency Department and triage nurse. ? ?Should you have questions after your visit or need to cancel or reschedule your appointment, please contact [facility] [phone]  and follow the prompts.  Office hours are 8:00 a.m. to 4:30 p.m. Monday - Friday. Please note that voicemails left after 4:00 p.m. may not be returned until the following business day.  We are closed weekends and major holidays. You have access to a nurse at all times for urgent questions. Please call the main number to the clinic [phone] and follow the prompts. ? ?For any non-urgent questions, you may also contact your provider using MyChart. We now offer e-Visits for anyone 18 and older to request care online for non-urgent symptoms. For details visit [email or web address]. ?  ?Also download the MyChart app! Go to the app store, search "MyChart", open the app, select [facility], and log in with your MyChart username and password. ? ?Due to Covid, a mask is required upon entering the hospital/clinic. If you do not have a mask, one will be given to you upon arrival. For doctor visits, patients may have 1 support person aged 18 or older with them. For treatment visits, patients cannot have anyone with them due to current Covid guidelines and our immunocompromised population.  ?

## 2022-02-12 DIAGNOSIS — R310 Gross hematuria: Secondary | ICD-10-CM | POA: Diagnosis not present

## 2022-02-13 ENCOUNTER — Emergency Department (HOSPITAL_COMMUNITY)
Admission: EM | Admit: 2022-02-13 | Discharge: 2022-02-13 | Disposition: A | Discharge status: Home / Self Care | Payer: Medicare Other | Attending: Emergency Medicine | Admitting: Emergency Medicine

## 2022-02-13 ENCOUNTER — Emergency Department (HOSPITAL_COMMUNITY): Payer: Medicare Other

## 2022-02-13 ENCOUNTER — Other Ambulatory Visit: Payer: Self-pay

## 2022-02-13 ENCOUNTER — Encounter (HOSPITAL_COMMUNITY): Payer: Self-pay

## 2022-02-13 DIAGNOSIS — Z7982 Long term (current) use of aspirin: Secondary | ICD-10-CM | POA: Diagnosis not present

## 2022-02-13 DIAGNOSIS — K5730 Diverticulosis of large intestine without perforation or abscess without bleeding: Secondary | ICD-10-CM | POA: Diagnosis not present

## 2022-02-13 DIAGNOSIS — N3001 Acute cystitis with hematuria: Secondary | ICD-10-CM | POA: Diagnosis not present

## 2022-02-13 DIAGNOSIS — R319 Hematuria, unspecified: Secondary | ICD-10-CM | POA: Insufficient documentation

## 2022-02-13 DIAGNOSIS — R109 Unspecified abdominal pain: Secondary | ICD-10-CM | POA: Diagnosis not present

## 2022-02-13 DIAGNOSIS — Z85038 Personal history of other malignant neoplasm of large intestine: Secondary | ICD-10-CM | POA: Diagnosis not present

## 2022-02-13 DIAGNOSIS — Z79899 Other long term (current) drug therapy: Secondary | ICD-10-CM | POA: Diagnosis not present

## 2022-02-13 DIAGNOSIS — K219 Gastro-esophageal reflux disease without esophagitis: Secondary | ICD-10-CM | POA: Insufficient documentation

## 2022-02-13 DIAGNOSIS — R112 Nausea with vomiting, unspecified: Secondary | ICD-10-CM | POA: Diagnosis not present

## 2022-02-13 DIAGNOSIS — I10 Essential (primary) hypertension: Secondary | ICD-10-CM | POA: Diagnosis not present

## 2022-02-13 DIAGNOSIS — Z853 Personal history of malignant neoplasm of breast: Secondary | ICD-10-CM | POA: Diagnosis not present

## 2022-02-13 LAB — CBC WITH DIFFERENTIAL/PLATELET
Abs Immature Granulocytes: 0.06 10*3/uL (ref 0.00–0.07)
Basophils Absolute: 0.1 10*3/uL (ref 0.0–0.1)
Basophils Relative: 1 %
Eosinophils Absolute: 0 10*3/uL (ref 0.0–0.5)
Eosinophils Relative: 0 %
HCT: 37.9 % (ref 36.0–46.0)
Hemoglobin: 11.7 g/dL — ABNORMAL LOW (ref 12.0–15.0)
Immature Granulocytes: 1 %
Lymphocytes Relative: 10 %
Lymphs Abs: 0.8 10*3/uL (ref 0.7–4.0)
MCH: 27.3 pg (ref 26.0–34.0)
MCHC: 30.9 g/dL (ref 30.0–36.0)
MCV: 88.6 fL (ref 80.0–100.0)
Monocytes Absolute: 0.4 10*3/uL (ref 0.1–1.0)
Monocytes Relative: 5 %
Neutro Abs: 6.7 10*3/uL (ref 1.7–7.7)
Neutrophils Relative %: 83 %
Platelets: 203 10*3/uL (ref 150–400)
RBC: 4.28 MIL/uL (ref 3.87–5.11)
RDW: 15.2 % (ref 11.5–15.5)
WBC: 8.1 10*3/uL (ref 4.0–10.5)
nRBC: 0 % (ref 0.0–0.2)

## 2022-02-13 LAB — COMPREHENSIVE METABOLIC PANEL
ALT: 9 U/L (ref 0–44)
AST: 19 U/L (ref 15–41)
Albumin: 3.7 g/dL (ref 3.5–5.0)
Alkaline Phosphatase: 89 U/L (ref 38–126)
Anion gap: 9 (ref 5–15)
BUN: 13 mg/dL (ref 8–23)
CO2: 22 mmol/L (ref 22–32)
Calcium: 9 mg/dL (ref 8.9–10.3)
Chloride: 107 mmol/L (ref 98–111)
Creatinine, Ser: 1.77 mg/dL — ABNORMAL HIGH (ref 0.44–1.00)
GFR, Estimated: 28 mL/min — ABNORMAL LOW (ref >60)
Glucose, Bld: 155 mg/dL — ABNORMAL HIGH (ref 70–99)
Potassium: 3.6 mmol/L (ref 3.5–5.1)
Sodium: 138 mmol/L (ref 135–145)
Total Bilirubin: 0.5 mg/dL (ref 0.3–1.2)
Total Protein: 6.8 g/dL (ref 6.5–8.1)

## 2022-02-13 LAB — URINALYSIS, ROUTINE W REFLEX MICROSCOPIC
Bilirubin Urine: NEGATIVE
Glucose, UA: NEGATIVE mg/dL
Ketones, ur: NEGATIVE mg/dL
Nitrite: POSITIVE — AB
Protein, ur: 100 mg/dL — AB
Specific Gravity, Urine: 1.025 (ref 1.005–1.030)
pH: 6.5 (ref 5.0–8.0)

## 2022-02-13 LAB — URINALYSIS, MICROSCOPIC (REFLEX): RBC / HPF: >50 RBC/hpf (ref 0–5)

## 2022-02-13 LAB — LIPASE, BLOOD: Lipase: 33 U/L (ref 11–51)

## 2022-02-13 LAB — CEA: CEA: 7.9 ng/mL — ABNORMAL HIGH (ref 0.0–4.7)

## 2022-02-13 MED ORDER — FENTANYL CITRATE PF 50 MCG/ML IJ SOSY
50.0000 ug | PREFILLED_SYRINGE | Freq: Once | INTRAMUSCULAR | Status: AC
  Administered 2022-02-13: 50 ug via INTRAVENOUS
  Filled 2022-02-13: qty 1

## 2022-02-13 MED ORDER — TAMSULOSIN HCL 0.4 MG PO CAPS: 0.4000 mg | ORAL_CAPSULE | Freq: Every day | ORAL | 0 refills | Status: DC

## 2022-02-13 MED ORDER — HEPARIN SOD (PORK) LOCK FLUSH 100 UNIT/ML IV SOLN
500.0000 [IU] | Freq: Once | INTRAVENOUS | Status: AC
Start: 2022-02-13 — End: 2022-02-13
  Administered 2022-02-13: 500 [IU]
  Filled 2022-02-13: qty 5

## 2022-02-13 MED ORDER — HYDROMORPHONE HCL 1 MG/ML IJ SOLN
0.5000 mg | Freq: Once | INTRAMUSCULAR | Status: AC
  Administered 2022-02-13: 0.5 mg via INTRAVENOUS
  Filled 2022-02-13: qty 1

## 2022-02-13 MED ORDER — SODIUM CHLORIDE 0.9 % IV SOLN
1.0000 g | Freq: Once | INTRAVENOUS | Status: AC
  Administered 2022-02-13: 1 g via INTRAVENOUS
  Filled 2022-02-13: qty 10

## 2022-02-13 MED ORDER — SODIUM CHLORIDE 0.9 % IV BOLUS
500.0000 mL | Freq: Once | INTRAVENOUS | Status: AC
  Administered 2022-02-13: 500 mL via INTRAVENOUS

## 2022-02-13 MED ORDER — ONDANSETRON HCL 4 MG/2ML IJ SOLN
4.0000 mg | Freq: Once | INTRAMUSCULAR | Status: AC
  Administered 2022-02-13: 4 mg via INTRAVENOUS
  Filled 2022-02-13: qty 2

## 2022-02-13 MED ORDER — CEPHALEXIN 500 MG PO CAPS: 500.0000 mg | ORAL_CAPSULE | Freq: Four times a day (QID) | ORAL | 0 refills | Status: DC

## 2022-02-13 NOTE — ED Triage Notes (Signed)
Reports left flank pain that started this am.  States "its a kidney stone".  +n/v ?

## 2022-02-13 NOTE — Discharge Instructions (Addendum)
Take Keflex 4 times daily for the 5 days.  Your work-up today does not show a kidney stone, urine is notable for UTI.  You are given the first dose of antibiotics here in the ED.  CD provided of your CT scan today in the office.  Please follow-up with your urologist, give them a call tomorrow and be seen in the next week. ?Take Flomax ?

## 2022-02-13 NOTE — ED Provider Notes (Signed)
?[facility] ?Provider Note ? ? ?CSN: [id number] ?Arrival date & time: 02/13/22  1526 ? ?  ? ?History ? ?Chief Complaint  ?Patient presents with  ? Flank Pain  ? ? ?Jessica Taylor is a 81 y.o. female. ? ? ?Flank Pain ? ? ?Patient with medical history notable for GERD, hypertension, hyperlipidemia, colon cancer and breast cancer currently undergoing chemotherapy every 3 weeks, presents today with left flank pain.  It started this morning, its been constant.  Is associated with hematuria, no dysuria.  There is nausea and multiple episodes of emesis, she has not had any diarrhea or constipation. ? ?Home Medications ?Prior to Admission medications   ?Medication Sig Start Date End Date Taking? Authorizing Provider  ?amLODipine (NORVASC) 5 MG tablet TAKE 1 TABLET BY MOUTH ONCE A DAY. 11/11/21   Derek Jack, MD  ?ampicillin (PRINCIPEN) 500 MG capsule Take 500 mg by mouth 3 (three) times daily. 01/19/22   [provider]  ?anastrozole (ARIMIDEX) 1 MG tablet TAKE 1 TABLET BY MOUTH ONCE DAILY. 08/17/21   Derek Jack, MD  ?aspirin EC 81 MG tablet Take 81 mg by mouth every evening.     [provider]  ?atorvastatin (LIPITOR) 40 MG tablet Take 1 tablet (40 mg total) by mouth daily. Please keep upcoming appointment for future refills. Thank you. 01/20/22   Jerline Pain, MD  ?clopidogrel (PLAVIX) 75 MG tablet Please keep upcoming appointment for future refills. Thank you 01/20/22   Jerline Pain, MD  ?esomeprazole (NEXIUM) 20 MG capsule Take 20 mg by mouth daily.     [provider]  ?HYDROcodone-acetaminophen (NORCO/VICODIN) 5-325 MG tablet Take 1 tablet by mouth daily.    [provider]  ?hydrOXYzine (ATARAX/VISTARIL) 25 MG tablet TAKE (1) TABLET BY MOUTH AT BEDTIME AS NEEDED 08/15/21   Derek Jack, MD  ?levothyroxine (SYNTHROID) 50 MCG tablet Take 50 mcg by mouth daily. 09/19/21   [provider]  ?lidocaine-prilocaine (EMLA) cream Apply  to affected area once 05/14/20   Derek Jack, MD  ?metoprolol tartrate (LOPRESSOR) 25 MG tablet TAKE 1 TABLET BY MOUTH TWICE DAILY. Please keep upcoming appt in May 2023 with Dr. Marlou Porch before anymore refills. Thank you Final Attempt 01/12/22   Jerline Pain, MD  ?nitroGLYCERIN (NITROSTAT) 0.4 MG SL tablet Place 1 tablet (0.4 mg total) under the tongue every 5 (five) minutes as needed for chest pain. 10/11/20   Jerline Pain, MD  ?Pembrolizumab [facility] IV) Inject 200 mg into the vein every 21 ( twenty-one) days.     [provider]  ?pembrolizumab Beryle Flock) 100 MG/4ML SOLN See admin instructions. 11/12/20   [provider]  ?prochlorperazine (COMPAZINE) 10 MG tablet Take 1 tablet (10 mg total) by mouth every 6 (six) hours as needed (Nausea or vomiting). 05/14/20   Derek Jack, MD  ?Vitamin D, Ergocalciferol, (DRISDOL) 1.25 MG (50000 UNIT) CAPS capsule TAKE 1 CAPSULE BY MOUTH ONCE A WEEK. 02/03/22   Derek Jack, MD  ?   ? ?Allergies    ?Crab [shellfish allergy], Ezetimibe, and Other   ? ?Review of Systems   ?Review of Systems  ?Genitourinary:  Positive for flank pain.  ? ?Physical Exam ?Updated Vital Signs ?BP (!) 166/70 (BP Location: Right Arm)   Pulse 66   Temp 97.7 ?F (36.5 ?C) (Oral)   Resp 18   Ht '5\' 3"'$  (1.6 m)   Wt 72.2 kg   LMP  (LMP Unknown)   SpO2 100%   BMI  28.21 kg/m?  ?Physical Exam ?Vitals and nursing note reviewed. Exam conducted with a chaperone present.  ?Constitutional:   ?   Appearance: Normal appearance.  ?HENT:  ?   Head: Normocephalic and atraumatic.  ?Eyes:  ?   General: No scleral icterus.    ?   Right eye: No discharge.     ?   Left eye: No discharge.  ?   Extraocular Movements: Extraocular movements intact.  ?   Pupils: Pupils are equal, round, and reactive to light.  ?Cardiovascular:  ?   Rate and Rhythm: Normal rate and regular rhythm.  ?   Pulses: Normal pulses.  ?   Heart sounds: Normal heart sounds. No murmur heard. ?  No friction  rub. No gallop.  ?Pulmonary:  ?   Effort: Pulmonary effort is normal. No respiratory distress.  ?   Breath sounds: Normal breath sounds.  ?Abdominal:  ?   General: Abdomen is flat. Bowel sounds are normal. There is no distension.  ?   Palpations: Abdomen is soft.  ?   Tenderness: There is no abdominal tenderness. There is left CVA tenderness.  ?Skin: ?   General: Skin is warm and dry.  ?   Coloration: Skin is not jaundiced.  ?Neurological:  ?   Mental Status: She is alert. Mental status is at baseline.  ?   Coordination: Coordination normal.  ? ? ?ED Results / Procedures / Treatments   ?Labs ?(all labs ordered are listed, but only abnormal results are displayed) ?Labs Reviewed  ?URINE CULTURE  ?CBC WITH DIFFERENTIAL/PLATELET  ?COMPREHENSIVE METABOLIC PANEL  ?LIPASE, BLOOD  ?URINALYSIS, ROUTINE W REFLEX MICROSCOPIC  ? ? ?EKG ?None ? ?Radiology ?No results found. ? ?Procedures ?Procedures  ? ? ?Medications Ordered in ED ?Medications  ?sodium chloride 0.9 % bolus 500 mL (has no administration in time range)  ?ondansetron (ZOFRAN) injection 4 mg (has no administration in time range)  ?fentaNYL (SUBLIMAZE) injection 50 mcg (has no administration in time range)  ? ? ?ED Course/ Medical Decision Making/ A&P ?Clinical Course as of 02/13/22 1807  ?Fri Feb 13, 2022  ?Marvin flomax, urology follow up. Treat for UTI [HS]  ?  ?Clinical Course User Index ?[HS] Sherrill Raring, PA-C  ? ?                        ?Medical Decision Making ?Amount and/or Complexity of Data Reviewed ?Labs: ordered. ?Radiology: ordered. ? ?Risk ?Prescription drug management. ? ? ?This patient presents to the ED for concern of left flank pain, this involves an extensive number of treatment options, and is a complaint that carries with it a high risk of complications and morbidity.  The differential diagnosis includes UTI, pyelonephritis, nephrolithiasis, other ? ?Patient?s presentation is complicated by their history of recent cystoscopy performed  yesterday. ? ? ?Additional history obtained:  ? ?Independent historian: Patient's daughter ? ?Patient is followed by [facility] for oncology.  [facility], unable to view those notes. ?  ?Lab Tests: ? ?I ordered, viewed, and personally interpreted labs.  The pertinent results include:   ?No leukocytosis or anemia.  Creatinine is elevated at 1.77 but this is roughly at baseline compared to chart review.  No leukocytosis, stable anemia with a hemoglobin of 11.7.  Urine is notable for UTI, urine culture is pending and obtained.  Lipase within normal limits. ? ?  ?Imaging Studies ordered: ? ?I directly visualized the CT renal, which  showed likely hematoma formation but no evidence of nephrolithiasis or inflammation or stranding that would be suggestive of [place] ? ?I agree with the radiologist interpretation ?  ? ?ECG/Cardiac monitoring:  ? ?The patient was maintained on a cardiac monitor.  Visualized monitor strip which showed NSR HR 66 per my interpretation.  ? ? ?Medicines ordered and prescription drug management: ? ?I ordered medication including: Fluid, Dilaudid, fentanyl ? ?I have reviewed the patients home medicines and have made adjustments as needed ? ? ?Test Considered: ? ?Considered admission but ultimately patient is well-appearing and tolerating p.o.  I do not feel like she meets criteria for admission. ? ?  ?Consultations Obtained: ? ?I requested consultation with the Dr. Gloriann Loan with urology.  Discussed lab and imaging findings as well as pertinent plan - they recommend: Flomax, treating the UTI and outpatient urology follow-up.  Do not feel patient needs admission, he reviewed the imaging and agrees with this plan. ? ? ?Reevaluation: ? ?After the interventions noted above, I reevaluated the patient and found patient pain somewhat improved, she is no longer vomiting and has not while in the ED. ? ? ?Problems addressed / ED Course: ?81 year old female presenting today due to left-sided  flank pain.  Started this morning, she has CVA tenderness on exam.  CT does not show any nephrolithiasis although there is a hematoma, spoke with urology who advises Flomax.  She has a UTI noted based on urine, he ur

## 2022-02-13 NOTE — ED Notes (Signed)
Patient transported to CT 

## 2022-02-15 LAB — URINE CULTURE: Culture: <10000 — AB

## 2022-03-04 ENCOUNTER — Other Ambulatory Visit (HOSPITAL_COMMUNITY): Payer: Self-pay | Admitting: Hematology

## 2022-03-04 ENCOUNTER — Inpatient Hospital Stay (HOSPITAL_BASED_OUTPATIENT_CLINIC_OR_DEPARTMENT_OTHER): Payer: Medicare Other | Admitting: Hematology

## 2022-03-04 ENCOUNTER — Inpatient Hospital Stay (HOSPITAL_COMMUNITY): Payer: Medicare Other

## 2022-03-04 ENCOUNTER — Inpatient Hospital Stay (HOSPITAL_COMMUNITY): Payer: Medicare Other | Attending: Hematology

## 2022-03-04 VITALS — BP 117/73 | HR 67 | Temp 98.1°F | Resp 18 | Ht 63.39 in | Wt 156.7 lb

## 2022-03-04 VITALS — BP 149/90 | HR 55 | Temp 97.9°F | Resp 18

## 2022-03-04 DIAGNOSIS — Z170 Estrogen receptor positive status [ER+]: Secondary | ICD-10-CM | POA: Insufficient documentation

## 2022-03-04 DIAGNOSIS — Z79899 Other long term (current) drug therapy: Secondary | ICD-10-CM | POA: Insufficient documentation

## 2022-03-04 DIAGNOSIS — C182 Malignant neoplasm of ascending colon: Secondary | ICD-10-CM

## 2022-03-04 DIAGNOSIS — C50911 Malignant neoplasm of unspecified site of right female breast: Secondary | ICD-10-CM

## 2022-03-04 DIAGNOSIS — Z5112 Encounter for antineoplastic immunotherapy: Secondary | ICD-10-CM | POA: Insufficient documentation

## 2022-03-04 DIAGNOSIS — E0590 Thyrotoxicosis, unspecified without thyrotoxic crisis or storm: Secondary | ICD-10-CM

## 2022-03-04 DIAGNOSIS — M810 Age-related osteoporosis without current pathological fracture: Secondary | ICD-10-CM | POA: Insufficient documentation

## 2022-03-04 DIAGNOSIS — I10 Essential (primary) hypertension: Secondary | ICD-10-CM | POA: Insufficient documentation

## 2022-03-04 DIAGNOSIS — C773 Secondary and unspecified malignant neoplasm of axilla and upper limb lymph nodes: Secondary | ICD-10-CM | POA: Diagnosis not present

## 2022-03-04 LAB — COMPREHENSIVE METABOLIC PANEL
ALT: 14 U/L (ref 0–44)
AST: 21 U/L (ref 15–41)
Albumin: 3.8 g/dL (ref 3.5–5.0)
Alkaline Phosphatase: 88 U/L (ref 38–126)
Anion gap: 6 (ref 5–15)
BUN: 16 mg/dL (ref 8–23)
CO2: 25 mmol/L (ref 22–32)
Calcium: 9.5 mg/dL (ref 8.9–10.3)
Chloride: 106 mmol/L (ref 98–111)
Creatinine, Ser: 1.82 mg/dL — ABNORMAL HIGH (ref 0.44–1.00)
GFR, Estimated: 27 mL/min — ABNORMAL LOW (ref >60)
Glucose, Bld: 119 mg/dL — ABNORMAL HIGH (ref 70–99)
Potassium: 3.5 mmol/L (ref 3.5–5.1)
Sodium: 137 mmol/L (ref 135–145)
Total Bilirubin: 0.6 mg/dL (ref 0.3–1.2)
Total Protein: 7 g/dL (ref 6.5–8.1)

## 2022-03-04 LAB — CBC WITH DIFFERENTIAL/PLATELET
Abs Immature Granulocytes: 0.02 10*3/uL (ref 0.00–0.07)
Basophils Absolute: 0.1 10*3/uL (ref 0.0–0.1)
Basophils Relative: 1 %
Eosinophils Absolute: 0.1 10*3/uL (ref 0.0–0.5)
Eosinophils Relative: 1 %
HCT: 36.8 % (ref 36.0–46.0)
Hemoglobin: 11.4 g/dL — ABNORMAL LOW (ref 12.0–15.0)
Immature Granulocytes: 0 %
Lymphocytes Relative: 20 %
Lymphs Abs: 1.1 10*3/uL (ref 0.7–4.0)
MCH: 26.8 pg (ref 26.0–34.0)
MCHC: 31 g/dL (ref 30.0–36.0)
MCV: 86.6 fL (ref 80.0–100.0)
Monocytes Absolute: 0.3 10*3/uL (ref 0.1–1.0)
Monocytes Relative: 6 %
Neutro Abs: 4 10*3/uL (ref 1.7–7.7)
Neutrophils Relative %: 72 %
Platelets: 235 10*3/uL (ref 150–400)
RBC: 4.25 MIL/uL (ref 3.87–5.11)
RDW: 15.6 % — ABNORMAL HIGH (ref 11.5–15.5)
WBC: 5.6 10*3/uL (ref 4.0–10.5)
nRBC: 0 % (ref 0.0–0.2)

## 2022-03-04 LAB — TSH: TSH: 4.979 u[IU]/mL — ABNORMAL HIGH (ref 0.350–4.500)

## 2022-03-04 MED ORDER — SODIUM CHLORIDE 0.9 % IV SOLN
200.0000 mg | Freq: Once | INTRAVENOUS | Status: AC
  Administered 2022-03-04: 200 mg via INTRAVENOUS
  Filled 2022-03-04: qty 8

## 2022-03-04 MED ORDER — SODIUM CHLORIDE 0.9 % IV SOLN: Freq: Once | INTRAVENOUS | Status: AC

## 2022-03-04 MED ORDER — HEPARIN SOD (PORK) LOCK FLUSH 100 UNIT/ML IV SOLN
500.0000 [IU] | Freq: Once | INTRAVENOUS | Status: AC | PRN
  Administered 2022-03-04: 500 [IU]

## 2022-03-04 MED ORDER — SODIUM CHLORIDE 0.9% FLUSH
10.0000 mL | INTRAVENOUS | Status: DC | PRN
  Administered 2022-03-04: 10 mL

## 2022-03-04 NOTE — Patient Instructions (Signed)
[facility]  Discharge Instructions: Thank you for choosing [facility] to provide your oncology and hematology care.  If you have a lab appointment with the Cancer Center, please come in thru the Main Entrance and check in at the main information desk.  Wear comfortable clothing and clothing appropriate for easy access to any Portacath or PICC line.   We strive to give you quality time with your provider. You may need to reschedule your appointment if you arrive late (15 or more minutes).  Arriving late affects you and other patients whose appointments are after yours.  Also, if you miss three or more appointments without notifying the office, you may be dismissed from the clinic at the provider's discretion.      For prescription refill requests, have your pharmacy contact our office and allow 72 hours for refills to be completed.    Today you received the following chemotherapy and/or immunotherapy agents Keytruda       To help prevent nausea and vomiting after your treatment, we encourage you to take your nausea medication as directed.  BELOW ARE SYMPTOMS THAT SHOULD BE REPORTED IMMEDIATELY: *FEVER GREATER THAN 100.4 F (38 C) OR HIGHER *CHILLS OR SWEATING *NAUSEA AND VOMITING THAT IS NOT CONTROLLED WITH YOUR NAUSEA MEDICATION *UNUSUAL SHORTNESS OF BREATH *UNUSUAL BRUISING OR BLEEDING *URINARY PROBLEMS (pain or burning when urinating, or frequent urination) *BOWEL PROBLEMS (unusual diarrhea, constipation, pain near the anus) TENDERNESS IN MOUTH AND THROAT WITH OR WITHOUT PRESENCE OF ULCERS (sore throat, sores in mouth, or a toothache) UNUSUAL RASH, SWELLING OR PAIN  UNUSUAL VAGINAL DISCHARGE OR ITCHING   Items with * indicate a potential emergency and should be followed up as soon as possible or go to the Emergency Department if any problems should occur.  Please show the CHEMOTHERAPY ALERT CARD or IMMUNOTHERAPY ALERT CARD at check-in to the Emergency  Department and triage nurse.  Should you have questions after your visit or need to cancel or reschedule your appointment, please contact [facility] [phone]  and follow the prompts.  Office hours are 8:00 a.m. to 4:30 p.m. Monday - Friday. Please note that voicemails left after 4:00 p.m. may not be returned until the following business day.  We are closed weekends and major holidays. You have access to a nurse at all times for urgent questions. Please call the main number to the clinic [phone] and follow the prompts.  For any non-urgent questions, you may also contact your provider using MyChart. We now offer e-Visits for anyone 18 and older to request care online for non-urgent symptoms. For details visit [email or web address].   Also download the MyChart app! Go to the app store, search "MyChart", open the app, select [facility], and log in with your MyChart username and password.  Due to Covid, a mask is required upon entering the hospital/clinic. If you do not have a mask, one will be given to you upon arrival. For doctor visits, patients may have 1 support person aged 18 or older with them. For treatment visits, patients cannot have anyone with them due to current Covid guidelines and our immunocompromised population.  

## 2022-03-04 NOTE — Patient Instructions (Addendum)
[facility] at [facility] ?Discharge Instructions ? ? ?You were seen and examined today by Dr. Delton Coombes. ? ?He reviewed your lab work which is normal/stable. ? ?Use over the counter hydrocortisone cream on the areas of rash to help with itching and irritation.  ? ?We will arrange for you to have a mammogram prior to next visit.  ? ?We will proceed with your treatment today. ? ?Return as scheduled.  ? ? ?Thank you for choosing [facility] at [facility] to provide your oncology and hematology care.  To afford each patient quality time with our provider, please arrive at least 15 minutes before your scheduled appointment time.  ? ?If you have a lab appointment with the [facility] please come in thru the Main Entrance and check in at the main information desk. ? ?You need to re-schedule your appointment should you arrive 10 or more minutes late.  We strive to give you quality time with our providers, and arriving late affects you and other patients whose appointments are after yours.  Also, if you no show three or more times for appointments you may be dismissed from the clinic at the providers discretion.     ?Again, thank you for choosing [facility].  Our hope is that these requests will decrease the amount of time that you wait before being seen by our physicians.       ?_____________________________________________________________ ? ?Should you have questions after your visit to [facility], please contact our office at [phone] and follow the prompts.  Our office hours are 8:00 a.m. and 4:30 p.m. Monday - Friday.  Please note that voicemails left after 4:00 p.m. may not be returned until the following business day.  We are closed weekends and major holidays.  You do have access to a nurse 24-7, just call the main number to the clinic [phone] and do not press any options, hold on the line and a nurse will answer the phone.    ? ?For prescription refill requests, have your pharmacy contact our office and allow 72 hours.   ? ?Due to Covid, you will need to wear a mask upon entering the hospital. If you do not have a mask, a mask will be given to you at the Main Entrance upon arrival. For doctor visits, patients may have 1 support person age 101 or older with them. For treatment visits, patients can not have anyone with them due to social distancing guidelines and our immunocompromised population.  ? ?   ?

## 2022-03-04 NOTE — Progress Notes (Signed)
Pt presents today for Keytruda per provider's order. Vital signs and other labs WNL for treatment today. Pt's creatinine 1.82 today. MD made aware okay to proceed with treatment today. Pt will receive 548m bolus of NS over 1 hour today per Dr.K. ? ?Keytruda and 500 mL NS bolus given today per MD orders. Tolerated infusion without adverse affects. Vital signs stable. No complaints at this time. Discharged from clinic ambulatory in stable condition. Alert and oriented x 3. [facility]   ?

## 2022-03-04 NOTE — Progress Notes (Signed)
Patient is taking Anastrozole as prescribed.  She has not missed any doses and reports no side effects at this time.   

## 2022-03-04 NOTE — Progress Notes (Signed)
? ?[facility] ?[address] ?[place], [place] [address] ? ? ?CLINIC:  ?Medical Oncology/Hematology ? ?PCP:  ?Antony Contras, MD ?[address] [place] / [place]  [address] ?[phone] ? ? ?REASON FOR VISIT:  ?Follow-up for stage I right breast cancer and colon cancer ? ?PRIOR THERAPY:  ?1. Robotic proximal colectomy on 10/11/2019. ?2. Right breast lumpectomy and SLNB on 04/02/2020 ? ?NGS Results: MSI--high, Foundation 1 not sent ? ?CURRENT THERAPY: Keytruda every 3 weeks ? ?BRIEF ONCOLOGIC HISTORY:  ?Oncology History  ?Cancer of ascending colon s/p robotic proximal colectomy 10/11/2019  ?10/11/2019 Initial Diagnosis  ? Cancer of ascending colon s/p robotic proximal colectomy 10/11/2019 ?  ?10/23/2019 Cancer Staging  ? Staging form: Colon and Rectum, AJCC 8th Edition ?- Clinical stage from 10/23/2019: Stage IVA (cT3, cN1c, cM1a) - Signed by Derek Jack, MD on 12/14/2019 ? ?  ?05/14/2020 -  Chemotherapy  ? Patient is on Treatment Plan : COLORECTAL Pembrolizumab q21d  ?   ?Malignant neoplasm of right female breast ([place])  ?04/23/2020 Initial Diagnosis  ? 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  ?04/23/2020 Cancer Staging  ? Staging form: Breast, AJCC 8th Edition ?- Clinical stage from 04/23/2020: Stage IA (cT1c, cN0(sn), cM0, G2, ER+, PR+, HER2-) - Signed by Derek Jack, MD on 04/23/2020 ? ?  ? ? ?CANCER STAGING: ? Cancer Staging  ?Cancer of ascending colon s/p robotic proximal colectomy 10/11/2019 ?Staging form: Colon and Rectum, AJCC 8th Edition ?- Clinical stage from 10/23/2019: Stage IVA (cT3, cN1c, cM1a) - Signed by Derek Jack, MD on 12/14/2019 ? ?Malignant neoplasm of right female breast ([place]) ?Staging form: Breast, AJCC 8th Edition ?- Clinical stage from 04/23/2020: Stage IA (cT1c, cN0(sn), cM0, G2, ER+, PR+, HER2-) - Signed by Derek Jack, MD on 04/23/2020 ? ? ?INTERVAL HISTORY:  ?[name], a 81 y.o. female, returns for routine  follow-up and consideration for next cycle of chemotherapy. Zahria was last seen on 01/21/2022. ? ?Due for cycle #31 of Keytruda today.  ? ?Overall, she tells me she has been feeling pretty well. She reports stable soft, mild diarrhea. She reports when she is drinking liquid and turns her head to the left her throat feels tight as if she will choke. She denies nausea and vomiting. She reports itching skin lesions, 1 on her right hand, 1 on her chest, and 1 on the left side of her face, which appeared after starting keflex. She stopped keflex 1 week ago, and no new lesion have appeared. Her appetite is fair.  ? ?Overall, she feels ready for next cycle of chemo today.  ? ? ?REVIEW OF SYSTEMS:  ?Review of Systems  ?Constitutional:  Negative for appetite change.  ?HENT:   Positive for trouble swallowing.   ?Gastrointestinal:  Positive for diarrhea. Negative for nausea and vomiting.  ?Skin:  Positive for itching and rash.  ?All other systems reviewed and are negative. ? ?PAST MEDICAL/SURGICAL HISTORY:  ?Past Medical History:  ?Diagnosis Date  ? Abdominal aortic aneurysm   ? Acid reflux   ? Anemia   ? Arthritis   ? knees , R shoulder - tx /w injection - 11/2014  ? Basal cell carcinoma (BCC) of dorsum of nose 2010  ? Resolved  ? Cancer [facility])   ? basal cell on nose  ? Colon cancer ([place])   ? Coronary artery disease   ? Hypercholesteremia   ? Hypertension   ? Hypothyroidism   ? Lt Acute pyelonephritis  09/11/2018  ? 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 Peripheral arterial disease ([place])   ? hhigh-grade ostial bilateral calcified iliac stenosis with claudication  ? Tobacco abuse   ? Vertigo   ? when lays on left side.  ? ?Past Surgical History:  ?Procedure Laterality Date  ? BREAST LUMPECTOMY WITH RADIOACTIVE SEED AND SENTINEL LYMPH NODE BIOPSY Bilateral 04/02/2020  ? Procedure: BILATERAL BREAST LUMPECTOMY WITH RADIOACTIVE SEED AND RIGHT SENTINEL LYMPH NODE BIOPSY AND RIGHT TARGETED AXILLARY LYMPH NODE BIOPSY;  Surgeon: Erroll Luna, MD;  Location:  [facility];  Service: General;  Laterality: Bilateral;  ? CARDIAC CATHETERIZATION  5 stents  ? CARDIAC CATHETERIZATION N/A 01/20/2016  ? Procedure: Left Heart Cath and Coronary Angiography;  Surgeon: Peter M Martinique, MD;  Location: [facility];  Service: Cardiovascular;  Laterality: N/A;  ? CATARACT EXTRACTION W/PHACO Left 05/24/2014  ? Procedure: CATARACT EXTRACTION PHACO AND INTRAOCULAR LENS PLACEMENT (IOC);  Surgeon: Tonny Branch, MD;  Location: [facility];  Service: Ophthalmology;  Laterality: Left;  CDE:  9.30  ? CATARACT EXTRACTION W/PHACO Right 06/18/2014  ? Procedure: CATARACT EXTRACTION PHACO AND INTRAOCULAR LENS PLACEMENT RIGHT EYE CDE=10.84;  Surgeon: Tonny Branch, MD;  Location: [facility];  Service: Ophthalmology;  Laterality: Right;  ? CORONARY ARTERY BYPASS GRAFT N/A 01/24/2016  ? Procedure: CORONARY ARTERY BYPASS GRAFTING (CABG);  Surgeon: Gaye Pollack, MD;  Location: [facility];  Service: Open Heart Surgery;  Laterality: N/A;  ? CORONARY STENT PLACEMENT    ? CORONARY STENT PLACEMENT  03/06/15  ? CFX DES  ? CYSTOSCOPY/URETEROSCOPY/HOLMIUM LASER/STENT PLACEMENT Left 09/12/2018  ? Procedure: CYSTOSCOPY/URETEROSCOPY/STENT PLACEMENT;  Surgeon: Ceasar Mons, MD;  Location: [facility];  Service: Urology;  Laterality: Left;  ? EYE SURGERY    ? LEFT HEART CATH AND CORS/GRAFTS ANGIOGRAPHY N/A 09/12/2019  ? Procedure: LEFT HEART CATH AND CORS/GRAFTS ANGIOGRAPHY;  Surgeon: Troy Sine, MD;  Location: [facility];  Service: Cardiovascular;  Laterality: N/A;  ? LEFT HEART CATHETERIZATION WITH CORONARY ANGIOGRAM N/A 03/06/2015  ? Procedure: LEFT HEART CATHETERIZATION WITH CORONARY ANGIOGRAM;  Surgeon: Lorretta Harp, MD;  Location: [facility] CATH LAB;  Service: Cardiovascular;  Laterality: N/A;  ? PARTIAL KNEE ARTHROPLASTY Right 02/04/2015  ? Procedure: UNICOMPARTMENTAL KNEE;  Surgeon: Dorna Leitz, MD;  Location: [facility];  Service: Orthopedics;  Laterality: Right;  ? PERIPHERAL VASCULAR CATHETERIZATION  Bilateral 05/13/2015  ? Procedure: Lower Extremity Angiography;  Surgeon: Lorretta Harp, MD;  Location: [facility];  Service: Cardiovascular;  Laterality: Bilateral;  ? PERIPHERAL VASCULAR CATHETERIZATION N/A 05/13/2015  ? Procedure: Abdominal Aortogram;  Surgeon: Lorretta Harp, MD;  Location: [facility];  Service: Cardiovascular;  Laterality: N/A;  ? PERIPHERAL VASCULAR CATHETERIZATION Bilateral 06/27/2015  ? Procedure: Peripheral Vascular Intervention;  Surgeon: Lorretta Harp, MD;  Location: [facility];  Service: Cardiovascular;  Laterality: Bilateral;  ILIACS  ? PERIPHERAL VASCULAR CATHETERIZATION Bilateral 06/27/2015  ? Procedure: Peripheral Vascular Atherectomy;  Surgeon: Lorretta Harp, MD;  Location: [facility];  Service: Cardiovascular;  Laterality: Bilateral;  ? PORTACATH PLACEMENT Left 05/24/2020  ? Procedure: INSERTION PORT-A-CATH;  Surgeon: Aviva Signs, MD;  Location: [facility];  Service: General;  Laterality: Left;  ? TEE WITHOUT CARDIOVERSION N/A 01/24/2016  ? Procedure: TRANSESOPHAGEAL ECHOCARDIOGRAM (TEE);  Surgeon: Gaye Pollack, MD;  Location: [facility];  Service: Open Heart Surgery;  Laterality: N/A;  ? TONSILLECTOMY    ? age 12  ? TUBAL LIGATION    ? ? ?  SOCIAL HISTORY:  ?Social History  ? ?Socioeconomic History  ? Marital status: Married  ?  Spouse name: [name]  ? Number of children: 4  ? Years of education: Not on file  ? Highest education level: Not on file  ?Occupational History  ? Occupation: retired  ?  Comment: worked as Presenter, broadcasting for [employer]  ?Tobacco Use  ? Smoking status: Former  ?  Packs/day: 1.50  ?  Years: 42.00  ?  Pack years: 63.00  ?  Types: Cigarettes  ?  Quit date: 05/19/1999  ?  Years since quitting: 22.8  ? Smokeless tobacco: Never  ?Vaping Use  ? Vaping Use: Never used  ?Substance and Sexual Activity  ? Alcohol use: No  ? Drug use: No  ? Sexual activity: Yes  ?  Birth control/protection: Surgical  ?Other Topics Concern  ? Not on file   ?Social History Narrative  ? Not on file  ? ?Social Determinants of Health  ? ?Financial Resource Strain: Not on file  ?Food Insecurity: Not on file  ?Transportation Needs: Not on file  ?Physical Activity:

## 2022-03-18 ENCOUNTER — Other Ambulatory Visit: Payer: Self-pay | Admitting: Cardiology

## 2022-03-20 ENCOUNTER — Other Ambulatory Visit: Payer: Self-pay

## 2022-03-20 ENCOUNTER — Emergency Department (HOSPITAL_COMMUNITY)
Admission: EM | Admit: 2022-03-20 | Discharge: 2022-03-20 | Disposition: A | Discharge status: Home / Self Care | Payer: Medicare Other | Attending: Emergency Medicine | Admitting: Emergency Medicine

## 2022-03-20 ENCOUNTER — Encounter (HOSPITAL_COMMUNITY): Payer: Self-pay

## 2022-03-20 ENCOUNTER — Emergency Department (HOSPITAL_COMMUNITY): Payer: Medicare Other

## 2022-03-20 DIAGNOSIS — N23 Unspecified renal colic: Secondary | ICD-10-CM | POA: Diagnosis not present

## 2022-03-20 DIAGNOSIS — R319 Hematuria, unspecified: Secondary | ICD-10-CM

## 2022-03-20 DIAGNOSIS — I714 Abdominal aortic aneurysm, without rupture, unspecified: Secondary | ICD-10-CM | POA: Diagnosis not present

## 2022-03-20 DIAGNOSIS — Z7982 Long term (current) use of aspirin: Secondary | ICD-10-CM | POA: Diagnosis not present

## 2022-03-20 DIAGNOSIS — Z85038 Personal history of other malignant neoplasm of large intestine: Secondary | ICD-10-CM | POA: Diagnosis not present

## 2022-03-20 DIAGNOSIS — R109 Unspecified abdominal pain: Secondary | ICD-10-CM | POA: Diagnosis present

## 2022-03-20 DIAGNOSIS — Z853 Personal history of malignant neoplasm of breast: Secondary | ICD-10-CM | POA: Diagnosis not present

## 2022-03-20 DIAGNOSIS — K7689 Other specified diseases of liver: Secondary | ICD-10-CM | POA: Diagnosis not present

## 2022-03-20 DIAGNOSIS — K5730 Diverticulosis of large intestine without perforation or abscess without bleeding: Secondary | ICD-10-CM | POA: Diagnosis not present

## 2022-03-20 DIAGNOSIS — N2889 Other specified disorders of kidney and ureter: Secondary | ICD-10-CM | POA: Diagnosis not present

## 2022-03-20 LAB — CBC WITH DIFFERENTIAL/PLATELET
Abs Immature Granulocytes: 0.06 10*3/uL (ref 0.00–0.07)
Basophils Absolute: 0.1 10*3/uL (ref 0.0–0.1)
Basophils Relative: 1 %
Eosinophils Absolute: 0.5 10*3/uL (ref 0.0–0.5)
Eosinophils Relative: 4 %
HCT: 38.8 % (ref 36.0–46.0)
Hemoglobin: 12.1 g/dL (ref 12.0–15.0)
Immature Granulocytes: 1 %
Lymphocytes Relative: 9 %
Lymphs Abs: 1 10*3/uL (ref 0.7–4.0)
MCH: 26.7 pg (ref 26.0–34.0)
MCHC: 31.2 g/dL (ref 30.0–36.0)
MCV: 85.5 fL (ref 80.0–100.0)
Monocytes Absolute: 0.5 10*3/uL (ref 0.1–1.0)
Monocytes Relative: 5 %
Neutro Abs: 8.9 10*3/uL — ABNORMAL HIGH (ref 1.7–7.7)
Neutrophils Relative %: 80 %
Platelets: 205 10*3/uL (ref 150–400)
RBC: 4.54 MIL/uL (ref 3.87–5.11)
RDW: 15.3 % (ref 11.5–15.5)
WBC: 11 10*3/uL — ABNORMAL HIGH (ref 4.0–10.5)
nRBC: 0 % (ref 0.0–0.2)

## 2022-03-20 LAB — URINALYSIS, ROUTINE W REFLEX MICROSCOPIC
Bacteria, UA: NONE SEEN
Bilirubin Urine: NEGATIVE
Glucose, UA: NEGATIVE mg/dL
Ketones, ur: 5 mg/dL — AB
Nitrite: NEGATIVE
Protein, ur: 100 mg/dL — AB
RBC / HPF: >50 RBC/hpf — ABNORMAL HIGH (ref 0–5)
Specific Gravity, Urine: 1.012 (ref 1.005–1.030)
pH: 6 (ref 5.0–8.0)

## 2022-03-20 LAB — COMPREHENSIVE METABOLIC PANEL
ALT: 11 U/L (ref 0–44)
AST: 19 U/L (ref 15–41)
Albumin: 4.2 g/dL (ref 3.5–5.0)
Alkaline Phosphatase: 101 U/L (ref 38–126)
Anion gap: 8 (ref 5–15)
BUN: 18 mg/dL (ref 8–23)
CO2: 26 mmol/L (ref 22–32)
Calcium: 9.7 mg/dL (ref 8.9–10.3)
Chloride: 105 mmol/L (ref 98–111)
Creatinine, Ser: 1.9 mg/dL — ABNORMAL HIGH (ref 0.44–1.00)
GFR, Estimated: 26 mL/min — ABNORMAL LOW (ref >60)
Glucose, Bld: 150 mg/dL — ABNORMAL HIGH (ref 70–99)
Potassium: 3.9 mmol/L (ref 3.5–5.1)
Sodium: 139 mmol/L (ref 135–145)
Total Bilirubin: 0.6 mg/dL (ref 0.3–1.2)
Total Protein: 7.6 g/dL (ref 6.5–8.1)

## 2022-03-20 LAB — LIPASE, BLOOD: Lipase: 30 U/L (ref 11–51)

## 2022-03-20 MED ORDER — FENTANYL CITRATE PF 50 MCG/ML IJ SOSY
50.0000 ug | PREFILLED_SYRINGE | Freq: Once | INTRAMUSCULAR | Status: AC
  Administered 2022-03-20: 50 ug via INTRAVENOUS
  Filled 2022-03-20: qty 1

## 2022-03-20 MED ORDER — HYDROCODONE-ACETAMINOPHEN 5-325 MG PO TABS: 1.0000 | ORAL_TABLET | Freq: Every day | ORAL | 0 refills | Status: DC

## 2022-03-20 MED ORDER — ONDANSETRON HCL 4 MG/2ML IJ SOLN
4.0000 mg | Freq: Once | INTRAMUSCULAR | Status: AC
  Administered 2022-03-20: 4 mg via INTRAVENOUS
  Filled 2022-03-20: qty 2

## 2022-03-20 MED ORDER — LACTATED RINGERS IV BOLUS
1000.0000 mL | Freq: Once | INTRAVENOUS | Status: AC
  Administered 2022-03-20: 1000 mL via INTRAVENOUS

## 2022-03-20 MED ORDER — ONDANSETRON 4 MG PO TBDP: 4.0000 mg | ORAL_TABLET | Freq: Three times a day (TID) | ORAL | 0 refills | Status: DC | PRN

## 2022-03-20 NOTE — ED Triage Notes (Signed)
Pt c/o flank pain that started around 7pm yesterday evening. Says she has had nausea and vomiting x 2. States pain has progressed throughout the night.  ?

## 2022-03-20 NOTE — ED Provider Notes (Signed)
? ?[place]  ?Provider Note ? ?CSN: [id number] ?Arrival date & time: 03/20/22 0118 ? ?History ?Chief Complaint  ?Patient presents with  ? Flank Pain  ? ? ?Jessica Taylor is a 81 y.o. female with history of colon and breast cancer reports onset of severe L flank pain and nausea/vomiting earlier this evening. She was seen for similar symptoms about a month ago, CT then showed hyperdense material in renal collecting system concerning for hematoma. Eventually she was discharged with urology follow up (she sees Dr. Rosana Hoes with [facility], although that chart is not linked in [place] for review). She report she DID actually pass a stone after that visit although none was seen in ureter on that CT. She has had hematuria for some time. No fevers.  ? ? ?Home Medications ?Prior to Admission medications   ?Medication Sig Start Date End Date Taking? Authorizing Provider  ?ondansetron (ZOFRAN-ODT) 4 MG disintegrating tablet Take 1 tablet (4 mg total) by mouth every 8 (eight) hours as needed for nausea or vomiting. 03/20/22  Yes Truddie Hidden, MD  ?amLODipine (NORVASC) 5 MG tablet TAKE 1 TABLET BY MOUTH ONCE A DAY. 11/11/21   Derek Jack, MD  ?ampicillin (PRINCIPEN) 500 MG capsule Take 500 mg by mouth 3 (three) times daily. 01/19/22   [provider]  ?anastrozole (ARIMIDEX) 1 MG tablet TAKE 1 TABLET BY MOUTH ONCE DAILY. 08/17/21   Derek Jack, MD  ?aspirin EC 81 MG tablet Take 81 mg by mouth every evening.     [provider]  ?atorvastatin (LIPITOR) 40 MG tablet Take 1 tablet (40 mg total) by mouth daily. Please keep upcoming appointment for future refills. Thank you. 01/20/22   Jerline Pain, MD  ?clopidogrel (PLAVIX) 75 MG tablet Please keep upcoming appointment for future refills. Thank you 01/20/22   Jerline Pain, MD  ?esomeprazole (NEXIUM) 20 MG capsule Take 20 mg by mouth daily.     [provider]  ?HYDROcodone-acetaminophen (NORCO/VICODIN) 5-325 MG  tablet Take 1 tablet by mouth daily. 03/20/22   Truddie Hidden, MD  ?hydrOXYzine (ATARAX/VISTARIL) 25 MG tablet TAKE (1) TABLET BY MOUTH AT BEDTIME AS NEEDED 08/15/21   Derek Jack, MD  ?levothyroxine (SYNTHROID) 25 MCG tablet Take 25 mcg by mouth daily. 02/17/22   [provider]  ?levothyroxine (SYNTHROID) 50 MCG tablet Take 50 mcg by mouth daily. 09/19/21   [provider]  ?lidocaine-prilocaine (EMLA) cream Apply to affected area once 05/14/20   Derek Jack, MD  ?metoprolol tartrate (LOPRESSOR) 25 MG tablet TAKE 1 TABLET BY MOUTH TWICE DAILY. NEEDS APPOINTMENT FOR FURTHER REFILLS. 03/18/22   Jerline Pain, MD  ?nitroGLYCERIN (NITROSTAT) 0.4 MG SL tablet Place 1 tablet (0.4 mg total) under the tongue every 5 (five) minutes as needed for chest pain. 10/11/20   Jerline Pain, MD  ?Pembrolizumab [facility] IV) Inject 200 mg into the vein every 21 ( twenty-one) days.     [provider]  ?pembrolizumab Beryle Flock) 100 MG/4ML SOLN See admin instructions. 11/12/20   [provider]  ?prochlorperazine (COMPAZINE) 10 MG tablet Take 1 tablet (10 mg total) by mouth every 6 (six) hours as needed (Nausea or vomiting). 05/14/20   Derek Jack, MD  ?tamsulosin (FLOMAX) 0.4 MG CAPS capsule Take 1 capsule (0.4 mg total) by mouth daily after supper. 02/13/22   Sherrill Raring, PA-C  ?Vitamin D, Ergocalciferol, (DRISDOL) 1.25 MG (50000 UNIT) CAPS capsule TAKE 1 CAPSULE BY MOUTH ONCE A WEEK. 02/03/22  Derek Jack, MD  ? ? ? ?Allergies    ?Crab [shellfish allergy], Ezetimibe, Keflex [cephalexin], and Other ? ? ?Review of Systems   ?Review of Systems ?Please see HPI for pertinent positives and negatives ? ?Physical Exam ?BP (!) 173/76   Pulse 85   Temp 98.5 ?F (36.9 ?C) (Oral)   Resp 17   Ht '5\' 3"'$  (1.6 m)   Wt 72.6 kg   LMP  (LMP Unknown)   SpO2 98%   BMI 28.34 kg/m?  ? ?Physical Exam ?Vitals and nursing note reviewed.  ?Constitutional:   ?   Appearance: Normal  appearance.  ?HENT:  ?   Head: Normocephalic and atraumatic.  ?   Nose: Nose normal.  ?   Mouth/Throat:  ?   Mouth: Mucous membranes are moist.  ?Eyes:  ?   Extraocular Movements: Extraocular movements intact.  ?   Conjunctiva/sclera: Conjunctivae normal.  ?Cardiovascular:  ?   Rate and Rhythm: Normal rate.  ?Pulmonary:  ?   Effort: Pulmonary effort is normal.  ?   Breath sounds: Normal breath sounds.  ?Abdominal:  ?   General: Abdomen is flat.  ?   Palpations: Abdomen is soft.  ?   Tenderness: There is no abdominal tenderness. There is no guarding.  ?Musculoskeletal:     ?   General: No swelling. Normal range of motion.  ?   Cervical back: Neck supple.  ?Skin: ?   General: Skin is warm and dry.  ?Neurological:  ?   General: No focal deficit present.  ?   Mental Status: She is alert.  ?Psychiatric:     ?   Mood and Affect: Mood normal.  ? ? ?ED Results / Procedures / Treatments   ?EKG ?None ? ?Procedures ?Procedures ? ?Medications Ordered in the ED ?Medications  ?fentaNYL (SUBLIMAZE) injection 50 mcg (50 mcg Intravenous Given 03/20/22 0212)  ?ondansetron [facility]) injection 4 mg (4 mg Intravenous Given 03/20/22 0214)  ?lactated ringers bolus 1,000 mL (0 mLs Intravenous Stopped 03/20/22 0412)  ?fentaNYL (SUBLIMAZE) injection 50 mcg (50 mcg Intravenous Given 03/20/22 0408)  ?ondansetron [facility]) injection 4 mg (4 mg Intravenous Given 03/20/22 0408)  ? ? ?Initial Impression and Plan ? Patient with flank pain consistent with renal colic. Will check labs, repeat CT to compare to previous. Pain/nausea meds for comfort.  ? ?ED Course  ? ?Clinical Course as of 03/20/22 0437  ?Fri Mar 20, 2022  ?0231 CBC with mild leukocytosis.  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 Lipase is normal.  [CS]  ?0321 UA shows blood but no signs of infection.  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 I was able to see notes from Dr. Rosana Hoes, Urology with [facility], he did a cystoscopy on her after her previous ED visit which was clear. Unfortunately it seems that due to the chart name mismatch between [facility] and [facility] he was not able to see the CT images or report. I spoke tonight with Dr. Enid Cutter, Urology at [facility], who will work to ensure prompt follow up with Dr. Rosana Hoes and requests that her CT be put on a CD to take to that office visit. Patient is aware of the current IT issues. Will give additional pain and nausea medications and anticipate discharge home when symptoms better controlled.  ? [CS]  ?  ?Clinical Course User Index ?[CS]  Truddie Hidden, MD  ? ? ? ?MDM Rules/Calculators/A&P ?Medical Decision Making ?Problems Addressed: ?Hematuria, unspecified type: acute illness or injury ?Renal colic on left side: acute illness or injury ? ?Amount and/or Complexity of Data Reviewed ?Labs: ordered. Decision-making details documented in ED Course. ?Radiology: ordered and independent interpretation performed. Decision-making details documented in ED Course. ? ?Risk ?Prescription drug management. ?Parenteral controlled substances. ? ? ? ?Final Clinical Impression(s) / ED Diagnoses ?Final diagnoses:  ?Renal colic on left side  ?Hematuria, unspecified type  ? ? ?Rx / DC Orders ?ED Discharge Orders   ? ?      Ordered  ?  HYDROcodone-acetaminophen (NORCO/VICODIN) 5-325 MG tablet  Daily       ? 03/20/22 0436  ?  ondansetron (ZOFRAN-ODT) 4 MG disintegrating tablet  Every 8 hours PRN       ? 03/20/22 0436  ? ?  ?  ? ?  ? ?  ?Truddie Hidden, MD ?03/20/22 [phone] ? ?

## 2022-03-25 ENCOUNTER — Inpatient Hospital Stay (HOSPITAL_COMMUNITY): Payer: Medicare Other | Attending: Hematology

## 2022-03-25 ENCOUNTER — Inpatient Hospital Stay (HOSPITAL_COMMUNITY): Payer: Medicare Other

## 2022-03-25 VITALS — BP 150/62 | HR 57 | Temp 98.5°F | Resp 18

## 2022-03-25 DIAGNOSIS — C182 Malignant neoplasm of ascending colon: Secondary | ICD-10-CM

## 2022-03-25 DIAGNOSIS — D649 Anemia, unspecified: Secondary | ICD-10-CM | POA: Insufficient documentation

## 2022-03-25 DIAGNOSIS — M810 Age-related osteoporosis without current pathological fracture: Secondary | ICD-10-CM | POA: Diagnosis not present

## 2022-03-25 DIAGNOSIS — N2889 Other specified disorders of kidney and ureter: Secondary | ICD-10-CM | POA: Diagnosis not present

## 2022-03-25 DIAGNOSIS — I10 Essential (primary) hypertension: Secondary | ICD-10-CM | POA: Diagnosis not present

## 2022-03-25 DIAGNOSIS — R319 Hematuria, unspecified: Secondary | ICD-10-CM | POA: Insufficient documentation

## 2022-03-25 DIAGNOSIS — Z87891 Personal history of nicotine dependence: Secondary | ICD-10-CM | POA: Insufficient documentation

## 2022-03-25 DIAGNOSIS — Z808 Family history of malignant neoplasm of other organs or systems: Secondary | ICD-10-CM | POA: Diagnosis not present

## 2022-03-25 DIAGNOSIS — L299 Pruritus, unspecified: Secondary | ICD-10-CM | POA: Insufficient documentation

## 2022-03-25 DIAGNOSIS — Z8041 Family history of malignant neoplasm of ovary: Secondary | ICD-10-CM | POA: Diagnosis not present

## 2022-03-25 DIAGNOSIS — C773 Secondary and unspecified malignant neoplasm of axilla and upper limb lymph nodes: Secondary | ICD-10-CM | POA: Insufficient documentation

## 2022-03-25 DIAGNOSIS — C50911 Malignant neoplasm of unspecified site of right female breast: Secondary | ICD-10-CM | POA: Diagnosis not present

## 2022-03-25 DIAGNOSIS — Z5112 Encounter for antineoplastic immunotherapy: Secondary | ICD-10-CM | POA: Insufficient documentation

## 2022-03-25 DIAGNOSIS — Z79899 Other long term (current) drug therapy: Secondary | ICD-10-CM | POA: Diagnosis not present

## 2022-03-25 DIAGNOSIS — R7989 Other specified abnormal findings of blood chemistry: Secondary | ICD-10-CM

## 2022-03-25 DIAGNOSIS — Z170 Estrogen receptor positive status [ER+]: Secondary | ICD-10-CM | POA: Insufficient documentation

## 2022-03-25 LAB — COMPREHENSIVE METABOLIC PANEL
ALT: 8 U/L (ref 0–44)
AST: 16 U/L (ref 15–41)
Albumin: 3.6 g/dL (ref 3.5–5.0)
Alkaline Phosphatase: 82 U/L (ref 38–126)
Anion gap: 9 (ref 5–15)
BUN: 20 mg/dL (ref 8–23)
CO2: 24 mmol/L (ref 22–32)
Calcium: 9.4 mg/dL (ref 8.9–10.3)
Chloride: 106 mmol/L (ref 98–111)
Creatinine, Ser: 2.24 mg/dL — ABNORMAL HIGH (ref 0.44–1.00)
GFR, Estimated: 21 mL/min — ABNORMAL LOW (ref >60)
Glucose, Bld: 124 mg/dL — ABNORMAL HIGH (ref 70–99)
Potassium: 3.4 mmol/L — ABNORMAL LOW (ref 3.5–5.1)
Sodium: 139 mmol/L (ref 135–145)
Total Bilirubin: 0.7 mg/dL (ref 0.3–1.2)
Total Protein: 7 g/dL (ref 6.5–8.1)

## 2022-03-25 LAB — CBC WITH DIFFERENTIAL/PLATELET
Abs Immature Granulocytes: 0.02 10*3/uL (ref 0.00–0.07)
Basophils Absolute: 0.1 10*3/uL (ref 0.0–0.1)
Basophils Relative: 1 %
Eosinophils Absolute: 0.1 10*3/uL (ref 0.0–0.5)
Eosinophils Relative: 1 %
HCT: 34.6 % — ABNORMAL LOW (ref 36.0–46.0)
Hemoglobin: 10.7 g/dL — ABNORMAL LOW (ref 12.0–15.0)
Immature Granulocytes: 0 %
Lymphocytes Relative: 18 %
Lymphs Abs: 1.1 10*3/uL (ref 0.7–4.0)
MCH: 26.6 pg (ref 26.0–34.0)
MCHC: 30.9 g/dL (ref 30.0–36.0)
MCV: 86.1 fL (ref 80.0–100.0)
Monocytes Absolute: 0.4 10*3/uL (ref 0.1–1.0)
Monocytes Relative: 6 %
Neutro Abs: 4.4 10*3/uL (ref 1.7–7.7)
Neutrophils Relative %: 74 %
Platelets: 238 10*3/uL (ref 150–400)
RBC: 4.02 MIL/uL (ref 3.87–5.11)
RDW: 15.7 % — ABNORMAL HIGH (ref 11.5–15.5)
WBC: 6.1 10*3/uL (ref 4.0–10.5)
nRBC: 0 % (ref 0.0–0.2)

## 2022-03-25 LAB — MAGNESIUM: Magnesium: 1.9 mg/dL (ref 1.7–2.4)

## 2022-03-25 LAB — TSH: TSH: 4.384 u[IU]/mL (ref 0.350–4.500)

## 2022-03-25 MED ORDER — SODIUM CHLORIDE 0.9 % IV SOLN: Freq: Once | INTRAVENOUS | Status: AC

## 2022-03-25 MED ORDER — HEPARIN SOD (PORK) LOCK FLUSH 100 UNIT/ML IV SOLN
500.0000 [IU] | Freq: Once | INTRAVENOUS | Status: AC | PRN
  Administered 2022-03-25: 500 [IU]

## 2022-03-25 MED ORDER — SODIUM CHLORIDE 0.9 % IV SOLN
200.0000 mg | Freq: Once | INTRAVENOUS | Status: AC
  Administered 2022-03-25: 200 mg via INTRAVENOUS
  Filled 2022-03-25: qty 8

## 2022-03-25 MED ORDER — SODIUM CHLORIDE 0.9% FLUSH
10.0000 mL | INTRAVENOUS | Status: DC | PRN
  Administered 2022-03-25: 10 mL

## 2022-03-25 NOTE — Patient Instructions (Signed)
[facility]  Discharge Instructions: Thank you for choosing [facility] to provide your oncology and hematology care.  If you have a lab appointment with the Cancer Center, please come in thru the Main Entrance and check in at the main information desk.  Wear comfortable clothing and clothing appropriate for easy access to any Portacath or PICC line.   We strive to give you quality time with your provider. You may need to reschedule your appointment if you arrive late (15 or more minutes).  Arriving late affects you and other patients whose appointments are after yours.  Also, if you miss three or more appointments without notifying the office, you may be dismissed from the clinic at the provider's discretion.      For prescription refill requests, have your pharmacy contact our office and allow 72 hours for refills to be completed.        To help prevent nausea and vomiting after your treatment, we encourage you to take your nausea medication as directed.  BELOW ARE SYMPTOMS THAT SHOULD BE REPORTED IMMEDIATELY: *FEVER GREATER THAN 100.4 F (38 C) OR HIGHER *CHILLS OR SWEATING *NAUSEA AND VOMITING THAT IS NOT CONTROLLED WITH YOUR NAUSEA MEDICATION *UNUSUAL SHORTNESS OF BREATH *UNUSUAL BRUISING OR BLEEDING *URINARY PROBLEMS (pain or burning when urinating, or frequent urination) *BOWEL PROBLEMS (unusual diarrhea, constipation, pain near the anus) TENDERNESS IN MOUTH AND THROAT WITH OR WITHOUT PRESENCE OF ULCERS (sore throat, sores in mouth, or a toothache) UNUSUAL RASH, SWELLING OR PAIN  UNUSUAL VAGINAL DISCHARGE OR ITCHING   Items with * indicate a potential emergency and should be followed up as soon as possible or go to the Emergency Department if any problems should occur.  Please show the CHEMOTHERAPY ALERT CARD or IMMUNOTHERAPY ALERT CARD at check-in to the Emergency Department and triage nurse.  Should you have questions after your visit or need to cancel  or reschedule your appointment, please contact [facility] [phone]  and follow the prompts.  Office hours are 8:00 a.m. to 4:30 p.m. Monday - Friday. Please note that voicemails left after 4:00 p.m. may not be returned until the following business day.  We are closed weekends and major holidays. You have access to a nurse at all times for urgent questions. Please call the main number to the clinic [phone] and follow the prompts.  For any non-urgent questions, you may also contact your provider using MyChart. We now offer e-Visits for anyone 18 and older to request care online for non-urgent symptoms. For details visit [email or web address].   Also download the MyChart app! Go to the app store, search "MyChart", open the app, select [place], and log in with your MyChart username and password.  Due to Covid, a mask is required upon entering the hospital/clinic. If you do not have a mask, one will be given to you upon arrival. For doctor visits, patients may have 1 support person aged 18 or older with them. For treatment visits, patients cannot have anyone with them due to current Covid guidelines and our immunocompromised population.  

## 2022-03-25 NOTE — Progress Notes (Signed)
Patient presents today for Keytruda infusion.  Patient is in satisfactory condition with no new complaints voiced.  Vital signs are stable.  Labs reviewed.  Creatinine today is 2.24.  MD made aware.  We will give 500 mL NS over one hour today per MD.  We will proceed with treatment per MD orders.  ? ?Patient tolerated treatment well with no complaints voiced.  Patient left ambulatory in stable condition.  Vital signs stable at discharge.  Follow up as scheduled.    ?

## 2022-03-25 NOTE — Progress Notes (Signed)
? ?  Per Dr Delton Coombes ok to proceed with [facility]. ? ?Henreitta Leber, PharmD ?

## 2022-03-30 ENCOUNTER — Other Ambulatory Visit (HOSPITAL_COMMUNITY): Payer: Self-pay | Admitting: Hematology

## 2022-03-31 ENCOUNTER — Ambulatory Visit (HOSPITAL_COMMUNITY)
Admission: RE | Admit: 2022-03-31 | Discharge: 2022-03-31 | Disposition: A | Discharge status: Home / Self Care | Payer: Medicare Other | Source: Ambulatory Visit | Attending: Hematology | Admitting: Hematology

## 2022-03-31 ENCOUNTER — Encounter (HOSPITAL_COMMUNITY): Payer: Self-pay

## 2022-03-31 DIAGNOSIS — C50911 Malignant neoplasm of unspecified site of right female breast: Secondary | ICD-10-CM | POA: Insufficient documentation

## 2022-03-31 DIAGNOSIS — Z853 Personal history of malignant neoplasm of breast: Secondary | ICD-10-CM | POA: Insufficient documentation

## 2022-04-01 ENCOUNTER — Inpatient Hospital Stay (HOSPITAL_COMMUNITY): Payer: Medicare Other

## 2022-04-01 VITALS — BP 140/64 | HR 68 | Temp 98.0°F | Resp 18

## 2022-04-01 DIAGNOSIS — Z808 Family history of malignant neoplasm of other organs or systems: Secondary | ICD-10-CM | POA: Diagnosis not present

## 2022-04-01 DIAGNOSIS — C50911 Malignant neoplasm of unspecified site of right female breast: Secondary | ICD-10-CM | POA: Diagnosis not present

## 2022-04-01 DIAGNOSIS — R319 Hematuria, unspecified: Secondary | ICD-10-CM | POA: Diagnosis not present

## 2022-04-01 DIAGNOSIS — C182 Malignant neoplasm of ascending colon: Secondary | ICD-10-CM

## 2022-04-01 DIAGNOSIS — Z170 Estrogen receptor positive status [ER+]: Secondary | ICD-10-CM | POA: Diagnosis not present

## 2022-04-01 DIAGNOSIS — N2889 Other specified disorders of kidney and ureter: Secondary | ICD-10-CM | POA: Diagnosis not present

## 2022-04-01 DIAGNOSIS — C773 Secondary and unspecified malignant neoplasm of axilla and upper limb lymph nodes: Secondary | ICD-10-CM | POA: Diagnosis not present

## 2022-04-01 DIAGNOSIS — M810 Age-related osteoporosis without current pathological fracture: Secondary | ICD-10-CM | POA: Diagnosis not present

## 2022-04-01 DIAGNOSIS — Z5112 Encounter for antineoplastic immunotherapy: Secondary | ICD-10-CM | POA: Diagnosis not present

## 2022-04-01 DIAGNOSIS — L299 Pruritus, unspecified: Secondary | ICD-10-CM | POA: Diagnosis not present

## 2022-04-01 DIAGNOSIS — D649 Anemia, unspecified: Secondary | ICD-10-CM | POA: Diagnosis not present

## 2022-04-01 DIAGNOSIS — I10 Essential (primary) hypertension: Secondary | ICD-10-CM | POA: Diagnosis not present

## 2022-04-01 DIAGNOSIS — Z95828 Presence of other vascular implants and grafts: Secondary | ICD-10-CM

## 2022-04-01 DIAGNOSIS — Z87891 Personal history of nicotine dependence: Secondary | ICD-10-CM | POA: Diagnosis not present

## 2022-04-01 DIAGNOSIS — Z79899 Other long term (current) drug therapy: Secondary | ICD-10-CM | POA: Diagnosis not present

## 2022-04-01 LAB — CBC WITH DIFFERENTIAL/PLATELET
Abs Immature Granulocytes: 0.04 10*3/uL (ref 0.00–0.07)
Basophils Absolute: 0.1 10*3/uL (ref 0.0–0.1)
Basophils Relative: 1 %
Eosinophils Absolute: 0.1 10*3/uL (ref 0.0–0.5)
Eosinophils Relative: 2 %
HCT: 34 % — ABNORMAL LOW (ref 36.0–46.0)
Hemoglobin: 10.4 g/dL — ABNORMAL LOW (ref 12.0–15.0)
Immature Granulocytes: 1 %
Lymphocytes Relative: 27 %
Lymphs Abs: 1.5 10*3/uL (ref 0.7–4.0)
MCH: 26.3 pg (ref 26.0–34.0)
MCHC: 30.6 g/dL (ref 30.0–36.0)
MCV: 85.9 fL (ref 80.0–100.0)
Monocytes Absolute: 0.3 10*3/uL (ref 0.1–1.0)
Monocytes Relative: 5 %
Neutro Abs: 3.5 10*3/uL (ref 1.7–7.7)
Neutrophils Relative %: 64 %
Platelets: 243 10*3/uL (ref 150–400)
RBC: 3.96 MIL/uL (ref 3.87–5.11)
RDW: 15.5 % (ref 11.5–15.5)
WBC: 5.4 10*3/uL (ref 4.0–10.5)
nRBC: 0 % (ref 0.0–0.2)

## 2022-04-01 LAB — COMPREHENSIVE METABOLIC PANEL
ALT: 11 U/L (ref 0–44)
AST: 21 U/L (ref 15–41)
Albumin: 3.7 g/dL (ref 3.5–5.0)
Alkaline Phosphatase: 82 U/L (ref 38–126)
Anion gap: 8 (ref 5–15)
BUN: 23 mg/dL (ref 8–23)
CO2: 23 mmol/L (ref 22–32)
Calcium: 9.4 mg/dL (ref 8.9–10.3)
Chloride: 108 mmol/L (ref 98–111)
Creatinine, Ser: 1.98 mg/dL — ABNORMAL HIGH (ref 0.44–1.00)
GFR, Estimated: 25 mL/min — ABNORMAL LOW (ref >60)
Glucose, Bld: 125 mg/dL — ABNORMAL HIGH (ref 70–99)
Potassium: 3.4 mmol/L — ABNORMAL LOW (ref 3.5–5.1)
Sodium: 139 mmol/L (ref 135–145)
Total Bilirubin: 0.4 mg/dL (ref 0.3–1.2)
Total Protein: 6.8 g/dL (ref 6.5–8.1)

## 2022-04-01 MED ORDER — SODIUM CHLORIDE 0.9% FLUSH
10.0000 mL | INTRAVENOUS | Status: DC | PRN
  Administered 2022-04-01: 10 mL via INTRAVENOUS

## 2022-04-01 MED ORDER — HEPARIN SOD (PORK) LOCK FLUSH 100 UNIT/ML IV SOLN
500.0000 [IU] | Freq: Once | INTRAVENOUS | Status: AC
  Administered 2022-04-01: 500 [IU] via INTRAVENOUS

## 2022-04-01 NOTE — Progress Notes (Signed)
Labs reviewed today. Creatinine 1.98. no hydration fluids need per guidelines by Tarri Abernethy PA-C/Dr. Delton Coombes. ?

## 2022-04-02 DIAGNOSIS — R109 Unspecified abdominal pain: Secondary | ICD-10-CM | POA: Diagnosis not present

## 2022-04-02 DIAGNOSIS — R310 Gross hematuria: Secondary | ICD-10-CM | POA: Diagnosis not present

## 2022-04-07 ENCOUNTER — Other Ambulatory Visit (HOSPITAL_COMMUNITY): Payer: Self-pay | Admitting: Hematology

## 2022-04-07 DIAGNOSIS — C50911 Malignant neoplasm of unspecified site of right female breast: Secondary | ICD-10-CM

## 2022-04-08 ENCOUNTER — Ambulatory Visit (INDEPENDENT_AMBULATORY_CARE_PROVIDER_SITE_OTHER): Payer: Medicare Other | Admitting: Cardiology

## 2022-04-08 ENCOUNTER — Encounter: Payer: Self-pay | Admitting: Cardiology

## 2022-04-08 DIAGNOSIS — I7409 Other arterial embolism and thrombosis of abdominal aorta: Secondary | ICD-10-CM | POA: Diagnosis not present

## 2022-04-08 DIAGNOSIS — Z01810 Encounter for preprocedural cardiovascular examination: Secondary | ICD-10-CM | POA: Diagnosis not present

## 2022-04-08 DIAGNOSIS — Z9861 Coronary angioplasty status: Secondary | ICD-10-CM

## 2022-04-08 DIAGNOSIS — E782 Mixed hyperlipidemia: Secondary | ICD-10-CM | POA: Diagnosis not present

## 2022-04-08 DIAGNOSIS — I2510 Atherosclerotic heart disease of native coronary artery without angina pectoris: Secondary | ICD-10-CM | POA: Diagnosis not present

## 2022-04-08 NOTE — Assessment & Plan Note (Signed)
Prior CABG grafts are patent.  Catheterization 2020 reviewed.  Stents are patent. ?

## 2022-04-08 NOTE — Assessment & Plan Note (Signed)
She may proceed with Dr. Tresa Endo at [facility] urology with renal biopsy, possible nephrectomy.  She will be of low to moderate cardiac risk.  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  She will need to hold her Plavix for 5 to 7 days prior to surgery.  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  If this is not possible, she will need to hold her aspirin as well. ?

## 2022-04-08 NOTE — Assessment & Plan Note (Signed)
Prior iliac stenting, 2016.  Lower extremity Dopplers have been reassuring.  Continue with goal-directed medical therapy. ?

## 2022-04-08 NOTE — Assessment & Plan Note (Signed)
Excellent control of lipids.  LDL 46.  Continue with high intensity statin.  No myalgias ?

## 2022-04-08 NOTE — Progress Notes (Signed)
?Cardiology Office Note:   ? ?Date:  04/08/2022  ? ?ID:  Jessica Taylor, DOB [birth year], MRN [id number] ? ?PCP:  Antony Contras, MD  ?[facility] Cardiologist:  Candee Furbish, MD  ?[facility] Electrophysiologist:  None  ? ?Referring MD: Antony Contras, MD  ? ? ? ?History of Present Illness:   ? ?Jessica Taylor is a 81 y.o. female here for the follow-up of coronary artery disease and hypertension. ? ?Currently undergoing chemotherapy every 3 weeks for colon and breast cancer.  ? ?Seen in the ED 02/13/2022 with concerns for left flank pain, associated with hematuria, nausea and multiple episodes of emesis. CT showed hematoma. She was encouraged to follow-up with outpatient urology. On 03/20/2022 she again presented to the ED with similar symptoms. She noted that she had passed a kidney stone since her prior ED visit. ? ?She presented to the ED 10/14/2021 for evaluation of palpitations in the setting of recent hematuria. She was started on antibiotics as there was evidence of a UTI. ? ?01/24/2016-CABG x3, LIMA to LAD SVG to ramus SVG to PDA. ? ?PAD-bilateral iliac stenting.  2016 Dr. Gwenlyn Found ? ?Echo 2017-EF 60% with grade 2 diastolic dysfunction. ? ?Nuclear stress test 08/2019-low risk no ischemia. ? ?Had colectomy. ? ?Today: ?She is accompanied by her daughter in law. She is feeling okay. 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 She will have a biopsy next month. For the past year she has struggled with hematuria. ? ?If she is very active she develops some shortness of breath, but this is not concerning to her. ? ?She notes multiple lesions/ecchymoses on her LE. She complains of pruritis as a side effect of Keytruda. ? ?Last week she had a mammogram and she was told her results were good. ? ?Her recent labs showed LDL was 46. ? ?She denies any palpitations, chest pain, or peripheral edema. No lightheadedness, headaches, syncope, orthopnea, or PND. ? ? ? ?Past Medical History:   ?Diagnosis Date  ? Abdominal aortic aneurysm ([place])   ? Acid reflux   ? Anemia   ? Arthritis   ? knees , R shoulder - tx /w injection - 11/2014  ? Basal cell carcinoma (BCC) of dorsum of nose 2010  ? Resolved  ? Cancer [facility])   ? basal cell on nose  ? Colon cancer ([place])   ? Coronary artery disease   ? Hypercholesteremia   ? Hypertension   ? Hypothyroidism   ? Lt Acute pyelonephritis 09/11/2018  ? 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 Peripheral arterial disease ([place])   ? hhigh-grade ostial bilateral calcified iliac stenosis with claudication  ? Tobacco abuse   ? Vertigo   ? when lays on left side.  ? ? ?Past Surgical History:  ?Procedure Laterality Date  ? BREAST LUMPECTOMY WITH RADIOACTIVE SEED AND SENTINEL LYMPH NODE BIOPSY Bilateral 04/02/2020  ? Procedure: BILATERAL BREAST LUMPECTOMY WITH RADIOACTIVE SEED AND RIGHT SENTINEL LYMPH NODE BIOPSY AND RIGHT TARGETED AXILLARY LYMPH NODE BIOPSY;  Surgeon: Erroll Luna, MD;  Location: [place];  Service: General;  Laterality: Bilateral;  ? CARDIAC CATHETERIZATION  5 stents  ? CARDIAC CATHETERIZATION N/A 01/20/2016  ? Procedure: Left Heart Cath and Coronary Angiography;  Surgeon: Peter M Martinique, MD;  Location: [place] CV [facility];  Service: Cardiovascular;  Laterality: N/A;  ? CATARACT EXTRACTION W/PHACO Left 05/24/2014  ? Procedure: CATARACT EXTRACTION PHACO AND INTRAOCULAR LENS PLACEMENT (IOC);  Surgeon: Tonny Branch, MD;  [facility];  Service: Ophthalmology;  Laterality: Left;  CDE:  9.30  ? CATARACT EXTRACTION W/PHACO Right 06/18/2014  ? Procedure: CATARACT EXTRACTION PHACO AND INTRAOCULAR LENS PLACEMENT RIGHT EYE CDE=10.84;  Surgeon: Tonny Branch, MD;  Location: [facility];  Service: Ophthalmology;  Laterality: Right;  ? CORONARY ARTERY BYPASS GRAFT N/A 01/24/2016  ? Procedure: CORONARY ARTERY BYPASS GRAFTING (CABG);  Surgeon: Gaye Pollack, MD;  Location: [place];  Service: Open Heart Surgery;  Laterality: N/A;  ? CORONARY STENT PLACEMENT    ? CORONARY STENT PLACEMENT  03/06/15  ?  CFX DES  ? CYSTOSCOPY/URETEROSCOPY/HOLMIUM LASER/STENT PLACEMENT Left 09/12/2018  ? Procedure: CYSTOSCOPY/URETEROSCOPY/STENT PLACEMENT;  Surgeon: Ceasar Mons, MD;  [facility]: [facility];  Service: Urology;  Laterality: Left;  ? EYE SURGERY    ? LEFT HEART CATH AND CORS/GRAFTS ANGIOGRAPHY N/A 09/12/2019  ? Procedure: LEFT HEART CATH AND CORS/GRAFTS ANGIOGRAPHY;  Surgeon: Troy Sine, MD;  Location: [place] [facility];  Service: Cardiovascular;  Laterality: N/A;  ? LEFT HEART CATHETERIZATION WITH CORONARY ANGIOGRAM N/A 03/06/2015  ? Procedure: LEFT HEART CATHETERIZATION WITH CORONARY ANGIOGRAM;  Surgeon: Lorretta Harp, MD;  Location: [facility];  Service: Cardiovascular;  Laterality: N/A;  ? PARTIAL KNEE ARTHROPLASTY Right 02/04/2015  ? Procedure: UNICOMPARTMENTAL KNEE;  Surgeon: Dorna Leitz, MD;  Location: [place];  Service: Orthopedics;  Laterality: Right;  ? PERIPHERAL VASCULAR CATHETERIZATION Bilateral 05/13/2015  ? Procedure: Lower Extremity Angiography;  Surgeon: Lorretta Harp, MD;  Location: [place] [facility];  Service: Cardiovascular;  Laterality: Bilateral;  ? PERIPHERAL VASCULAR CATHETERIZATION N/A 05/13/2015  ? Procedure: Abdominal Aortogram;  Surgeon: Lorretta Harp, MD;  Location: [place] [facility];  Service: Cardiovascular;  Laterality: N/A;  ? PERIPHERAL VASCULAR CATHETERIZATION Bilateral 06/27/2015  ? Procedure: Peripheral Vascular Intervention;  Surgeon: Lorretta Harp, MD;  Location: [place] [facility];  Service: Cardiovascular;  Laterality: Bilateral;  ILIACS  ? PERIPHERAL VASCULAR CATHETERIZATION Bilateral 06/27/2015  ? Procedure: Peripheral Vascular Atherectomy;  Surgeon: Lorretta Harp, MD;  Location: [place] [facility];  Service: Cardiovascular;  Laterality: Bilateral;  ? PORTACATH PLACEMENT Left 05/24/2020  ? Procedure: INSERTION PORT-A-CATH;  Surgeon: Aviva Signs, MD;  [facility];  Service: General;  Laterality: Left;  ? TEE WITHOUT CARDIOVERSION N/A 01/24/2016  ?  Procedure: TRANSESOPHAGEAL ECHOCARDIOGRAM (TEE);  Surgeon: Gaye Pollack, MD;  Location: [place];  Service: Open Heart Surgery;  Laterality: N/A;  ? TONSILLECTOMY    ? age 34  ? TUBAL LIGATION    ? ? ?Current Medications: ?Current Meds  ?Medication Sig  ? amLODipine (NORVASC) 5 MG tablet TAKE 1 TABLET BY MOUTH ONCE A DAY.  ? anastrozole (ARIMIDEX) 1 MG tablet TAKE 1 TABLET BY MOUTH ONCE DAILY.  ? aspirin EC 81 MG tablet Take 81 mg by mouth every evening.   ? atorvastatin (LIPITOR) 40 MG tablet Take 1 tablet (40 mg total) by mouth daily. Please keep upcoming appointment for future refills. Thank you.  ? clopidogrel (PLAVIX) 75 MG tablet Please keep upcoming appointment for future refills. Thank you  ? esomeprazole (NEXIUM) 20 MG capsule Take 20 mg by mouth daily.   ? HYDROcodone-acetaminophen (NORCO/VICODIN) 5-325 MG tablet Take 1 tablet by mouth daily.  ? hydrOXYzine (ATARAX) 25 MG tablet TAKE (1) TABLET BY MOUTH AT BEDTIME AS NEEDED  ? levothyroxine (SYNTHROID) 25 MCG tablet Take 25 mcg by mouth daily.  ? lidocaine-prilocaine (EMLA) cream Apply to affected area once  ? metoprolol tartrate (LOPRESSOR) 25 MG  tablet TAKE 1 TABLET BY MOUTH TWICE DAILY. NEEDS APPOINTMENT FOR FURTHER REFILLS.  ? nitroGLYCERIN (NITROSTAT) 0.4 MG SL tablet Place 1 tablet (0.4 mg total) under the tongue every 5 (five) minutes as needed for chest pain.  ? Pembrolizumab (KEYTRUDA IV) Inject 200 mg into the vein every 21 ( twenty-one) days.   ? pembrolizumab (KEYTRUDA) 100 MG/4ML SOLN See admin instructions.  ? prochlorperazine (COMPAZINE) 10 MG tablet Take 1 tablet (10 mg total) by mouth every 6 (six) hours as needed (Nausea or vomiting).  ? Vitamin D, Ergocalciferol, (DRISDOL) 1.25 MG (50000 UNIT) CAPS capsule TAKE 1 CAPSULE BY MOUTH ONCE A WEEK.  ?  ? ?Allergies:   Crab [shellfish allergy], Ezetimibe, Keflex [cephalexin], and Other  ? ?Social History  ? ?Socioeconomic History  ? Marital status: Married  ?  Spouse name: [name]  ? Number of  children: 4  ? Years of education: Not on file  ? Highest education level: Not on file  ?Occupational History  ? Occupation: retired  ?  Comment: worked as Presenter, broadcasting for [employer]  ?Tobacco Use  ? Smok

## 2022-04-08 NOTE — Patient Instructions (Signed)

## 2022-04-15 ENCOUNTER — Inpatient Hospital Stay (HOSPITAL_COMMUNITY): Payer: Medicare Other

## 2022-04-15 ENCOUNTER — Other Ambulatory Visit (HOSPITAL_COMMUNITY): Payer: Self-pay

## 2022-04-15 ENCOUNTER — Inpatient Hospital Stay (HOSPITAL_BASED_OUTPATIENT_CLINIC_OR_DEPARTMENT_OTHER): Payer: Medicare Other | Admitting: Hematology

## 2022-04-15 VITALS — BP 141/65 | HR 54 | Temp 98.0°F | Resp 18

## 2022-04-15 DIAGNOSIS — Z95828 Presence of other vascular implants and grafts: Secondary | ICD-10-CM

## 2022-04-15 DIAGNOSIS — D649 Anemia, unspecified: Secondary | ICD-10-CM

## 2022-04-15 DIAGNOSIS — C182 Malignant neoplasm of ascending colon: Secondary | ICD-10-CM

## 2022-04-15 DIAGNOSIS — C50911 Malignant neoplasm of unspecified site of right female breast: Secondary | ICD-10-CM | POA: Diagnosis not present

## 2022-04-15 DIAGNOSIS — C773 Secondary and unspecified malignant neoplasm of axilla and upper limb lymph nodes: Secondary | ICD-10-CM | POA: Diagnosis not present

## 2022-04-15 DIAGNOSIS — Z87891 Personal history of nicotine dependence: Secondary | ICD-10-CM | POA: Diagnosis not present

## 2022-04-15 DIAGNOSIS — M810 Age-related osteoporosis without current pathological fracture: Secondary | ICD-10-CM | POA: Diagnosis not present

## 2022-04-15 DIAGNOSIS — Z79899 Other long term (current) drug therapy: Secondary | ICD-10-CM | POA: Diagnosis not present

## 2022-04-15 DIAGNOSIS — L299 Pruritus, unspecified: Secondary | ICD-10-CM | POA: Diagnosis not present

## 2022-04-15 DIAGNOSIS — N2889 Other specified disorders of kidney and ureter: Secondary | ICD-10-CM | POA: Diagnosis not present

## 2022-04-15 DIAGNOSIS — C189 Malignant neoplasm of colon, unspecified: Secondary | ICD-10-CM | POA: Diagnosis not present

## 2022-04-15 DIAGNOSIS — Z808 Family history of malignant neoplasm of other organs or systems: Secondary | ICD-10-CM | POA: Diagnosis not present

## 2022-04-15 DIAGNOSIS — I10 Essential (primary) hypertension: Secondary | ICD-10-CM | POA: Diagnosis not present

## 2022-04-15 DIAGNOSIS — Z170 Estrogen receptor positive status [ER+]: Secondary | ICD-10-CM | POA: Diagnosis not present

## 2022-04-15 DIAGNOSIS — Z5112 Encounter for antineoplastic immunotherapy: Secondary | ICD-10-CM | POA: Diagnosis not present

## 2022-04-15 DIAGNOSIS — R319 Hematuria, unspecified: Secondary | ICD-10-CM | POA: Diagnosis not present

## 2022-04-15 LAB — CBC WITH DIFFERENTIAL/PLATELET
Abs Immature Granulocytes: 0.01 10*3/uL (ref 0.00–0.07)
Basophils Absolute: 0.1 10*3/uL (ref 0.0–0.1)
Basophils Relative: 2 %
Eosinophils Absolute: 0.1 10*3/uL (ref 0.0–0.5)
Eosinophils Relative: 1 %
HCT: 34.2 % — ABNORMAL LOW (ref 36.0–46.0)
Hemoglobin: 10.4 g/dL — ABNORMAL LOW (ref 12.0–15.0)
Immature Granulocytes: 0 %
Lymphocytes Relative: 22 %
Lymphs Abs: 1.1 10*3/uL (ref 0.7–4.0)
MCH: 25.7 pg — ABNORMAL LOW (ref 26.0–34.0)
MCHC: 30.4 g/dL (ref 30.0–36.0)
MCV: 84.4 fL (ref 80.0–100.0)
Monocytes Absolute: 0.4 10*3/uL (ref 0.1–1.0)
Monocytes Relative: 7 %
Neutro Abs: 3.4 10*3/uL (ref 1.7–7.7)
Neutrophils Relative %: 68 %
Platelets: 204 10*3/uL (ref 150–400)
RBC: 4.05 MIL/uL (ref 3.87–5.11)
RDW: 15.5 % (ref 11.5–15.5)
WBC: 5 10*3/uL (ref 4.0–10.5)
nRBC: 0 % (ref 0.0–0.2)

## 2022-04-15 LAB — COMPREHENSIVE METABOLIC PANEL
ALT: 10 U/L (ref 0–44)
AST: 18 U/L (ref 15–41)
Albumin: 3.9 g/dL (ref 3.5–5.0)
Alkaline Phosphatase: 85 U/L (ref 38–126)
Anion gap: 5 (ref 5–15)
BUN: 19 mg/dL (ref 8–23)
CO2: 24 mmol/L (ref 22–32)
Calcium: 9.3 mg/dL (ref 8.9–10.3)
Chloride: 109 mmol/L (ref 98–111)
Creatinine, Ser: 1.91 mg/dL — ABNORMAL HIGH (ref 0.44–1.00)
GFR, Estimated: 26 mL/min — ABNORMAL LOW (ref >60)
Glucose, Bld: 119 mg/dL — ABNORMAL HIGH (ref 70–99)
Potassium: 3.5 mmol/L (ref 3.5–5.1)
Sodium: 138 mmol/L (ref 135–145)
Total Bilirubin: 0.8 mg/dL (ref 0.3–1.2)
Total Protein: 6.9 g/dL (ref 6.5–8.1)

## 2022-04-15 LAB — TSH: TSH: 3.79 u[IU]/mL (ref 0.350–4.500)

## 2022-04-15 MED ORDER — HEPARIN SOD (PORK) LOCK FLUSH 100 UNIT/ML IV SOLN
500.0000 [IU] | Freq: Once | INTRAVENOUS | Status: AC | PRN
  Administered 2022-04-15: 500 [IU]

## 2022-04-15 MED ORDER — SODIUM CHLORIDE 0.9 % IV SOLN: Freq: Once | INTRAVENOUS | Status: AC

## 2022-04-15 MED ORDER — SODIUM CHLORIDE 0.9% FLUSH
10.0000 mL | INTRAVENOUS | Status: DC | PRN
  Administered 2022-04-15: 10 mL

## 2022-04-15 MED ORDER — SODIUM CHLORIDE 0.9 % IV SOLN
200.0000 mg | Freq: Once | INTRAVENOUS | Status: AC
  Administered 2022-04-15: 200 mg via INTRAVENOUS
  Filled 2022-04-15: qty 8

## 2022-04-15 NOTE — Patient Instructions (Addendum)
[place] at [facility] Discharge Instructions   You were seen and examined today by Dr. Delton Coombes.  He reviewed your lab work which is normal/stable.   We will proceed with your treatment today.  We will repeat a CT scan in about 6 weeks and see you back for office visit after scan to review the results.    Thank you for choosing [place] at [facility] to provide your oncology and hematology care.  To afford each patient quality time with our provider, please arrive at least 15 minutes before your scheduled appointment time.   If you have a lab appointment with the [facility] please come in thru the Main Entrance and check in at the main information desk.  You need to re-schedule your appointment should you arrive 10 or more minutes late.  We strive to give you quality time with our providers, and arriving late affects you and other patients whose appointments are after yours.  Also, if you no show three or more times for appointments you may be dismissed from the clinic at the providers discretion.     Again, thank you for choosing [facility].  Our hope is that these requests will decrease the amount of time that you wait before being seen by our physicians.       _____________________________________________________________  Should you have questions after your visit to [facility], please contact our office at [phone] and follow the prompts.  Our office hours are 8:00 a.m. and 4:30 p.m. Monday - Friday.  Please note that voicemails left after 4:00 p.m. may not be returned until the following business day.  We are closed weekends and major holidays.  You do have access to a nurse 24-7, just call the main number to the clinic [phone] and do not press any options, hold on the line and a nurse will answer the phone.    For prescription refill requests, have your pharmacy contact our office and allow  72 hours.    Due to Covid, you will need to wear a mask upon entering the hospital. If you do not have a mask, a mask will be given to you at the Main Entrance upon arrival. For doctor visits, patients may have 1 support person age 29 or older with them. For treatment visits, patients can not have anyone with them due to social distancing guidelines and our immunocompromised population.

## 2022-04-15 NOTE — Progress Notes (Signed)
Patient is taking Anastrozole as prescribed.  She has not missed any doses and reports no side effects at this time.   

## 2022-04-15 NOTE — Progress Notes (Signed)
[place], [place] [address]   CLINIC:  Medical Oncology/Hematology  PCP:  Antony Contras, MD [address] / [place] [address] [phone]   REASON FOR VISIT:  Follow-up for stage I right breast cancer and colon cancer  PRIOR THERAPY:  1. Robotic proximal colectomy on 10/11/2019. 2. Right breast lumpectomy and SLNB on 04/02/2020  NGS Results: MSI--high, Foundation 1 not sent  CURRENT THERAPY: Keytruda every 3 weeks  BRIEF ONCOLOGIC HISTORY:  Oncology History  Cancer of ascending colon s/p robotic proximal colectomy 10/11/2019  10/11/2019 Initial Diagnosis   Cancer of ascending colon s/p robotic proximal colectomy 10/11/2019    10/23/2019 Cancer Staging   Staging form: Colon and Rectum, AJCC 8th Edition - Clinical stage from 10/23/2019: Stage IVA (cT3, cN1c, cM1a) - Signed by Derek Jack, MD on 12/14/2019    05/14/2020 -  Chemotherapy   Patient is on Treatment Plan : COLORECTAL Pembrolizumab q21d      Malignant neoplasm of right female breast ([place])  04/23/2020 Initial Diagnosis   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 [facility])    04/23/2020 Cancer Staging   Staging form: Breast, AJCC 8th Edition - Clinical stage from 04/23/2020: Stage IA (cT1c, cN0(sn), cM0, G2, ER+, PR+, HER2-) - Signed by Derek Jack, MD on 04/23/2020      CANCER STAGING:  Cancer Staging  Cancer of ascending colon s/p robotic proximal colectomy 10/11/2019 Staging form: Colon and Rectum, AJCC 8th Edition - Clinical stage from 10/23/2019: Stage IVA (cT3, cN1c, cM1a) - Signed by Derek Jack, MD on 12/14/2019  Malignant neoplasm of right female breast [facility]) Staging form: Breast, AJCC 8th Edition - Clinical stage from 04/23/2020: Stage IA (cT1c, cN0(sn), cM0, G2, ER+, PR+, HER2-) - Signed by Derek Jack, MD on 04/23/2020   INTERVAL HISTORY:  Jessica Taylor, a 81 y.o. female, returns for routine  follow-up and consideration for next cycle of chemotherapy. Jessica Taylor was last seen on 03/04/2022.  Due for cycle #33 of Keytruda today.   Overall, she tells me she has been feeling pretty well. She reports hematuria. She has a history of kidney stones. She denies rash, and she continues to have itching. Her appetite is good.   Overall, she feels ready for next cycle of chemo today.   REVIEW OF SYSTEMS:  Review of Systems  Constitutional:  Positive for fatigue. Negative for appetite change.  Genitourinary:  Positive for hematuria and pelvic pain.   Skin:  Positive for itching. Negative for rash.  All other systems reviewed and are negative.  PAST MEDICAL/SURGICAL HISTORY:  Past Medical History:  Diagnosis Date   Abdominal aortic aneurysm (HCC)    Acid reflux    Anemia    Arthritis    knees , R shoulder - tx /w injection - 11/2014   Basal cell carcinoma (BCC) of dorsum of nose 2010   Resolved   Cancer ([place])    basal cell on nose   Colon cancer (HCC)    Coronary artery disease    Hypercholesteremia    Hypertension    Hypothyroidism    Lt Acute pyelonephritis 09/11/2018   MI, old 2000   Peripheral arterial disease ([place])    hhigh-grade ostial bilateral calcified iliac stenosis with claudication   Tobacco abuse    Vertigo    when lays on left side.   Past Surgical History:  Procedure Laterality Date   BREAST LUMPECTOMY WITH RADIOACTIVE SEED AND SENTINEL LYMPH  NODE BIOPSY Bilateral 04/02/2020   Procedure: BILATERAL BREAST LUMPECTOMY WITH RADIOACTIVE SEED AND RIGHT SENTINEL LYMPH NODE BIOPSY AND RIGHT TARGETED AXILLARY LYMPH NODE BIOPSY;  Surgeon: Erroll Luna, MD;  Location: [place];  Service: General;  Laterality: Bilateral;   CARDIAC CATHETERIZATION  5 stents   CARDIAC CATHETERIZATION N/A 01/20/2016   Procedure: Left Heart Cath and Coronary Angiography;  Surgeon: Peter M Martinique, MD;  Location: [place] CV [facility];  Service: Cardiovascular;  Laterality: N/A;    CATARACT EXTRACTION W/PHACO Left 05/24/2014   Procedure: CATARACT EXTRACTION PHACO AND INTRAOCULAR LENS PLACEMENT (IOC);  Surgeon: Tonny Branch, MD;  Location: [facility];  Service: Ophthalmology;  Laterality: Left;  CDE:  9.30   CATARACT EXTRACTION W/PHACO Right 06/18/2014   Procedure: CATARACT EXTRACTION PHACO AND INTRAOCULAR LENS PLACEMENT RIGHT EYE CDE=10.84;  Surgeon: Tonny Branch, MD;  Location: [facility];  Service: Ophthalmology;  Laterality: Right;   CORONARY ARTERY BYPASS GRAFT N/A 01/24/2016   Procedure: CORONARY ARTERY BYPASS GRAFTING (CABG);  Surgeon: Gaye Pollack, MD;  Location: [place];  Service: Open Heart Surgery;  Laterality: N/A;   CORONARY STENT PLACEMENT     CORONARY STENT PLACEMENT  03/06/15   CFX DES   CYSTOSCOPY/URETEROSCOPY/HOLMIUM LASER/STENT PLACEMENT Left 09/12/2018   Procedure: CYSTOSCOPY/URETEROSCOPY/STENT PLACEMENT;  Surgeon: Ceasar Mons, MD;  Location: [facility];  Service: Urology;  Laterality: Left;   EYE SURGERY     LEFT HEART CATH AND CORS/GRAFTS ANGIOGRAPHY N/A 09/12/2019   Procedure: LEFT HEART CATH AND CORS/GRAFTS ANGIOGRAPHY;  Surgeon: Troy Sine, MD;  Location: [place] [facility];  Service: Cardiovascular;  Laterality: N/A;   LEFT HEART CATHETERIZATION WITH CORONARY ANGIOGRAM N/A 03/06/2015   Procedure: LEFT HEART CATHETERIZATION WITH CORONARY ANGIOGRAM;  Surgeon: Lorretta Harp, MD;  Location: [facility] C[facility];  Service: Cardiovascular;  Laterality: N/A;   PARTIAL KNEE ARTHROPLASTY Right 02/04/2015   Procedure: UNICOMPARTMENTAL KNEE;  Surgeon: Dorna Leitz, MD;  Location: [place];  Service: Orthopedics;  Laterality: Right;   PERIPHERAL VASCULAR CATHETERIZATION Bilateral 05/13/2015   Procedure: Lower Extremity Angiography;  Surgeon: Lorretta Harp, MD;  Location: [place] [facility];  Service: Cardiovascular;  Laterality: Bilateral;   PERIPHERAL VASCULAR CATHETERIZATION N/A 05/13/2015   Procedure: Abdominal Aortogram;  Surgeon: Lorretta Harp, MD;  Location: [place] [facility];  Service: Cardiovascular;  Laterality: N/A;   PERIPHERAL VASCULAR CATHETERIZATION Bilateral 06/27/2015   Procedure: Peripheral Vascular Intervention;  Surgeon: Lorretta Harp, MD;  Location: [place] [facility];  Service: Cardiovascular;  Laterality: Bilateral;  ILIACS   PERIPHERAL VASCULAR CATHETERIZATION Bilateral 06/27/2015   Procedure: Peripheral Vascular Atherectomy;  Surgeon: Lorretta Harp, MD;  Location: [place] [facility];  Service: Cardiovascular;  Laterality: Bilateral;   PORTACATH PLACEMENT Left 05/24/2020   Procedure: INSERTION PORT-A-CATH;  Surgeon: Aviva Signs, MD;  Location[facility];  Service: General;  Laterality: Left;   TEE WITHOUT CARDIOVERSION N/A 01/24/2016   Procedure: TRANSESOPHAGEAL ECHOCARDIOGRAM (TEE);  Surgeon: Gaye Pollack, MD;  Location: [place];  Service: Open Heart Surgery;  Laterality: N/A;   TONSILLECTOMY     age 31   TUBAL LIGATION      SOCIAL HISTORY:  Social History   Socioeconomic History   Marital status: Married    Spouse name: Tommy   Number of children: 4   Years of education: Not on file   Highest education level: Not on file  Occupational History   Occupation: retired    Comment: worked as Presenter, broadcasting  for [employer]  Tobacco Use   Smoking status: Former    Packs/day: 1.50    Years: 42.00    Pack years: 63.00    Types: Cigarettes    Quit date: 05/19/1999    Years since quitting: 22.9   Smokeless tobacco: Never  Vaping Use   Vaping Use: Never used  Substance and Sexual Activity   Alcohol use: No   Drug use: No   Sexual activity: Yes    Birth control/protection: Surgical  Other Topics Concern   Not on file  Social History Narrative   Not on file   Social Determinants of Health   Financial Resource Strain: Not on file  Food Insecurity: Not on file  Transportation Needs: Not on file  Physical Activity: Not on file  Stress: Not on file  Social Connections: Not on file  Intimate Partner Violence: Not  on file    FAMILY HISTORY:  Family History  Problem Relation Age of Onset   Ovarian cancer Mother 52   Cancer Father 36       unsure of which kind, "it was in his glands"   Hypertension Maternal Grandmother    Stroke Maternal Grandfather    Hypertension Son    Brain cancer Sister    Hypertension Daughter    Heart attack Neg Hx    Colon cancer Neg Hx    Esophageal cancer Neg Hx    Inflammatory bowel disease Neg Hx    Liver disease Neg Hx    Pancreatic cancer Neg Hx    Rectal cancer Neg Hx    Stomach cancer Neg Hx     CURRENT MEDICATIONS:  Current Outpatient Medications  Medication Sig Dispense Refill   amLODipine (NORVASC) 5 MG tablet TAKE 1 TABLET BY MOUTH ONCE A DAY. 30 tablet 6   anastrozole (ARIMIDEX) 1 MG tablet TAKE 1 TABLET BY MOUTH ONCE DAILY. 30 tablet 6   aspirin EC 81 MG tablet Take 81 mg by mouth every evening.      atorvastatin (LIPITOR) 40 MG tablet Take 1 tablet (40 mg total) by mouth daily. Please keep upcoming appointment for future refills. Thank you. 90 tablet 0   clopidogrel (PLAVIX) 75 MG tablet Please keep upcoming appointment for future refills. Thank you 90 tablet 0   esomeprazole (NEXIUM) 20 MG capsule Take 20 mg by mouth daily.      HYDROcodone-acetaminophen (NORCO/VICODIN) 5-325 MG tablet Take 1 tablet by mouth daily. 15 tablet 0   hydrOXYzine (ATARAX) 25 MG tablet TAKE (1) TABLET BY MOUTH AT BEDTIME AS NEEDED 30 tablet 0   levothyroxine (SYNTHROID) 25 MCG tablet Take 25 mcg by mouth daily.     metoprolol tartrate (LOPRESSOR) 25 MG tablet TAKE 1 TABLET BY MOUTH TWICE DAILY. NEEDS APPOINTMENT FOR FURTHER REFILLS. 30 tablet 0   Pembrolizumab (KEYTRUDA IV) Inject 200 mg into the vein every 21 ( twenty-one) days.      pembrolizumab (KEYTRUDA) 100 MG/4ML SOLN See admin instructions.     Vitamin D, Ergocalciferol, (DRISDOL) 1.25 MG (50000 UNIT) CAPS capsule TAKE 1 CAPSULE BY MOUTH ONCE A WEEK. 4 capsule 0   lidocaine-prilocaine (EMLA) cream Apply to  affected area once (Patient not taking: Reported on 04/15/2022) 30 g 3   nitroGLYCERIN (NITROSTAT) 0.4 MG SL tablet Place 1 tablet (0.4 mg total) under the tongue every 5 (five) minutes as needed for chest pain. (Patient not taking: Reported on 04/15/2022) 25 tablet prn   prochlorperazine (COMPAZINE) 10 MG tablet Take 1 tablet (  10 mg total) by mouth every 6 (six) hours as needed (Nausea or vomiting). (Patient not taking: Reported on 04/15/2022) 30 tablet 1   No current facility-administered medications for this visit.    ALLERGIES:  Allergies  Allergen Reactions   Crab [Shellfish Allergy] Nausea And Vomiting    Throws up violently   Ezetimibe Diarrhea   Keflex [Cephalexin]     Caused sores   Other Nausea And Vomiting    Shrimp    PHYSICAL EXAM:  Performance status (ECOG): 1 - Symptomatic but completely ambulatory  There were no vitals filed for this visit. Wt Readings from Last 3 Encounters:  04/15/22 154 lb 5.2 oz (70 kg)  04/08/22 155 lb (70.3 kg)  03/20/22 160 lb (72.6 kg)   Physical Exam Vitals reviewed.  Constitutional:      Appearance: Normal appearance.  Cardiovascular:     Rate and Rhythm: Normal rate and regular rhythm.     Pulses: Normal pulses.     Heart sounds: Normal heart sounds.  Pulmonary:     Effort: Pulmonary effort is normal.     Breath sounds: Normal breath sounds.  Neurological:     General: No focal deficit present.     Mental Status: She is alert and oriented to person, place, and time.  Psychiatric:        Mood and Affect: Mood normal.        Behavior: Behavior normal.    LABORATORY DATA:  I have reviewed the labs as listed.     Latest Ref Rng & Units 04/15/2022   12:10 PM 04/01/2022    9:47 AM 03/25/2022   12:44 PM  CBC  WBC 4.0 - 10.5 K/uL 5.0   5.4   6.1    Hemoglobin 12.0 - 15.0 g/dL 10.4   10.4   10.7    Hematocrit 36.0 - 46.0 % 34.2   34.0   34.6    Platelets 150 - 400 K/uL 204   243   238        Latest Ref Rng & Units 04/15/2022    12:10 PM 04/01/2022    9:47 AM 03/25/2022   12:44 PM  CMP  Glucose 70 - 99 mg/dL 119   125   124    BUN 8 - 23 mg/dL _0 Creatinine 0.44 - 1.00 mg/dL 1.91   1.98   2.24    Sodium 135 - 145 mmol/L 138   139   139    Potassium 3.5 - 5.1 mmol/L 3.5   3.4   3.4    Chloride 98 - 111 mmol/L 109   108   106    CO2 22 - 32 mmol/L _1 Calcium 8.9 - 10.3 mg/dL 9.3   9.4   9.4    Total Protein 6.5 - 8.1 g/dL 6.9   6.8   7.0    Total Bilirubin 0.3 - 1.2 mg/dL 0.8   0.4   0.7    Alkaline Phos 38 - 126 U/L 85   82   82    AST 15 - 41 U/L _2 ALT 0 - 44 U/L _3 DIAGNOSTIC IMAGING:  I have independently reviewed the scans and discussed with the patient. 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 Patient history of cancer. EXAM: DIGITAL DIAGNOSTIC BILATERAL MAMMOGRAM WITH TOMOSYNTHESIS AND CAD TECHNIQUE: Bilateral digital diagnostic mammography and breast tomosynthesis was performed. The images were evaluated with computer-aided detection. COMPARISON:  Previous exam(s). ACR Breast Density Category b: There are scattered areas of fibroglandular density. FINDINGS: No suspicious masses or calcifications seen in either breast. Lumpectomy changes present within the retroareolar right breast. Spot compression magnification view of the right breast lumpectomy site was performed. There is no mammographic evidence locally recurrent. Postsurgical changes are present in the left breast with postsurgical scar in the far outer left breast corresponding well to a curvilinear scar in the lower outer left breast on the skin. There is no mammographic evidence of malignancy in either breast. IMPRESSION: Postsurgical changes in the bilateral breast. No mammographic evidence of malignancy.  RECOMMENDATION: Diagnostic mammogram is suggested in 1 year. (Code:[id number]-B-01Y) I have discussed the findings and recommendations with the patient. If applicable, a reminder letter will be sent to the patient regarding the next appointment. BI-RADS CATEGORY  2: Benign. Electronically Signed   By: Everlean Alstrom M.D.   On: 03/31/2022 14:53   CT Renal Stone Study  Result Date: 03/20/2022 CLINICAL DATA:  Flank pain, nausea/vomiting EXAM: CT ABDOMEN AND PELVIS WITHOUT CONTRAST TECHNIQUE: Multidetector CT imaging of the abdomen and pelvis was performed following the standard protocol without IV contrast. RADIATION DOSE REDUCTION: This exam was performed according to the departmental dose-optimization program which includes automated exposure control, adjustment of the mA and/or kV according to patient size and/or use of iterative reconstruction technique. COMPARISON:  02/13/2022 FINDINGS: Lower chest: Subpleural ground-glass opacities in the lungs bilaterally, favoring post infectious/inflammatory scarring, unchanged. Hepatobiliary: 12 mm cyst in the anterior right hepatic lobe (series 2/image 19). Layering tiny gallstone (series 2/image 29), without associated inflammatory changes. No intrahepatic or extrahepatic ductal dilatation. Pancreas: Within normal limits. Spleen: Within normal limits. Adrenals/Urinary Tract: Adrenal glands are within normal limits. Right kidney is within normal limits. No renal calculi or hydronephrosis. Left renal edema. Suspected 1.6 cm partially calcified soft tissue lesion in the left upper pole renal collecting system (series 2/image 27). Hyperdense filling defect/hemorrhage in the left renal collecting system extending to the mid ureter (coronal image 37). Bladder is underdistended. Stomach/Bowel: Stomach is notable for a small hiatal hernia. Status post right hemicolectomy/ileocecal resection with appendectomy. No evidence of bowel obstruction. Extensive left colonic diverticulosis,  without evidence of diverticulitis. Vascular/Lymphatic: 3.0 x 3.1 cm infrarenal abdominal aortic aneurysm (series 2/image 39), unchanged. Atherosclerotic calcifications of the abdominal aorta and branch vessels. Bilateral common iliac stents. No suspicious abdominopelvic lymphadenopathy. Reproductive: Uterus and bilateral ovaries are within normal limits. Other: No abdominopelvic ascites. Musculoskeletal: Mild degenerative changes of the lower thoracic spine. Median sternotomy. IMPRESSION: Suspected 1.6 cm partially calcified left upper pole renal lesion, raising concern for urothelial carcinoma. This is poorly evaluated on unenhanced CT. Urology consultation is suggested. Associated hyperdense filling defect/hemorrhage in the left renal collecting system extending to the mid ureter. This is unchanged from the prior. 3.1 cm infrarenal abdominal aortic aneurysm. Recommend follow-up every 3 years. Reference: J Am Coll Radiol 5638;[id number]. Electronically Signed   By: Julian Hy M.D.   On: 03/20/2022 03:02     ASSESSMENT:  1.  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  Right  hemicolectomy on 10/11/2019, grade 3 adenocarcinoma, negative margins, positive LVSI, 2 tumor deposits, 0/15 lymph nodes positive, PT3PN1C. 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  MLH1 hyper methylation present. -PET scan in December 2020 showed lymph node in the right axillary region.  Biopsy consistent with metastatic colon cancer. -Last CEA was 10.7 on 11/22/2019. -Right axillary lymph node excision on 04/02/2020 consistent with metastatic carcinoma from colon cancer. -Pembrolizumab started on 05/14/2020. -PET scan on 08/05/2020 shows complete resolution of lymphadenopathy.  No evidence of new areas of uptake.  Mild vague residual hypermetabolism in the right axilla without soft tissue mass. -PET scan on 12/23/2020 with no  evidence of recurrence.   2.  Stage I (PT1CPN0) right Breast, Grade 2 Invasive Lobular Carcinoma: -MRI of the breast on 12/25/2019 showed 1 cm mass behind the right areola with a suspicious mass in the left breast. -Right breast lumpectomy on 04/02/2020 shows invasive lobular carcinoma, grade 2, 1.2 cm.  Resection margins are negative.  Negative LVSI.  2 sentinel lymph nodes were negative for carcinoma.  ER/PR 100% positive, HER-2 negative, Ki-67 10%.   PLAN:  1.  Stage IV colon cancer to the right axillary lymph node: -CT CAP on 01/19/2022 with no evidence of metastatic disease in the chest, abdomen or pelvis. - She does not report any immunotherapy related side effects.  Last CEA was 7.9. - Reviewed labs today which showed creatinine 1.91 stable.  LFTs are normal.  CBC shows normocytic anemia which is stable.  Will check ferritin and iron panel at next visit. - She will proceed with treatment today.  Next treatment may be delayed by 1 week as she is having biopsy at [place] [place] 7 weeks for follow-up.  I will plan to repeat CT CAP with contrast at a CEA level prior to next visit.   2.  Right breast invasive lobular carcinoma, grade 2: -Continue anastrozole daily.  No side effects noted. - Reviewed mammogram from 03/31/2022 BI-RADS Category 2.   3.  Osteoporosis: -Continue vitamin D 50,000 units weekly.   4.  Left upper pole renal lesion: -CT renal study on 03/20/2022 showed 1.6 cm partially calcified left upper pole renal lesion raising suspicion for urothelial carcinoma. - She is also having hematuria. - She has follow-up with Dr. Rosana Hoes at [facility] and is scheduled for biopsy on 05/05/2022.   5.  Generalized pruritus: -Continue Atarax as needed.   Orders placed this encounter:  No orders of the defined types were placed in this encounter.    Derek Jack, MD [place] [phone]   I, Thana Ates, am acting as a scribe for Dr. Derek Jack.  I, Derek Jack MD, have reviewed the above documentation for accuracy and completeness, and I agree with the above.

## 2022-04-15 NOTE — Progress Notes (Signed)
Patient has been examined by Dr. Delton Coombes, and vital signs and labs have been reviewed. ANC, Creatinine, LFTs, hemoglobin, and platelets are within treatment parameters per M.D. and he is aware of creatinine of 1.9 - pt may proceed with treatment.

## 2022-04-15 NOTE — Patient Instructions (Signed)
[facility]  Discharge Instructions: Thank you for choosing [facility] to provide your oncology and hematology care.  If you have a lab appointment with the Cancer Center, please come in thru the Main Entrance and check in at the main information desk.  Wear comfortable clothing and clothing appropriate for easy access to any Portacath or PICC line.   We strive to give you quality time with your provider. You may need to reschedule your appointment if you arrive late (15 or more minutes).  Arriving late affects you and other patients whose appointments are after yours.  Also, if you miss three or more appointments without notifying the office, you may be dismissed from the clinic at the provider's discretion.      For prescription refill requests, have your pharmacy contact our office and allow 72 hours for refills to be completed.    Today you received the following chemotherapy and/or immunotherapy agents Keytruda.   To help prevent nausea and vomiting after your treatment, we encourage you to take your nausea medication as directed.  BELOW ARE SYMPTOMS THAT SHOULD BE REPORTED IMMEDIATELY: *FEVER GREATER THAN 100.4 F (38 C) OR HIGHER *CHILLS OR SWEATING *NAUSEA AND VOMITING THAT IS NOT CONTROLLED WITH YOUR NAUSEA MEDICATION *UNUSUAL SHORTNESS OF BREATH *UNUSUAL BRUISING OR BLEEDING *URINARY PROBLEMS (pain or burning when urinating, or frequent urination) *BOWEL PROBLEMS (unusual diarrhea, constipation, pain near the anus) TENDERNESS IN MOUTH AND THROAT WITH OR WITHOUT PRESENCE OF ULCERS (sore throat, sores in mouth, or a toothache) UNUSUAL RASH, SWELLING OR PAIN  UNUSUAL VAGINAL DISCHARGE OR ITCHING   Items with * indicate a potential emergency and should be followed up as soon as possible or go to the Emergency Department if any problems should occur.  Please show the CHEMOTHERAPY ALERT CARD or IMMUNOTHERAPY ALERT CARD at check-in to the Emergency  Department and triage nurse.  Should you have questions after your visit or need to cancel or reschedule your appointment, please contact  CANCER CENTER [phone]  and follow the prompts.  Office hours are 8:00 a.m. to 4:30 p.m. Monday - Friday. Please note that voicemails left after 4:00 p.m. may not be returned until the following business day.  We are closed weekends and major holidays. You have access to a nurse at all times for urgent questions. Please call the main number to the clinic [phone] and follow the prompts.  For any non-urgent questions, you may also contact your provider using MyChart. We now offer e-Visits for anyone 18 and older to request care online for non-urgent symptoms. For details visit [email or web address].   Also download the MyChart app! Go to the app store, search "MyChart", open the app, select [place], and log in with your MyChart username and password.  Due to Covid, a mask is required upon entering the hospital/clinic. If you do not have a mask, one will be given to you upon arrival. For doctor visits, patients may have 1 support person aged 18 or older with them. For treatment visits, patients cannot have anyone with them due to current Covid guidelines and our immunocompromised population.   Pembrolizumab injection What is this medication? PEMBROLIZUMAB (pem broe liz ue mab) is a monoclonal antibody. It is used to treat certain types of cancer. This medicine may be used for other purposes; ask your health care provider or pharmacist if you have questions. COMMON BRAND NAME(S): Keytruda What should I tell my care team before I take this medication? They   need to know if you have any of these conditions: autoimmune diseases like Crohn's disease, ulcerative colitis, or lupus have had or planning to have an allogeneic stem cell transplant (uses someone else's stem cells) history of organ transplant history of chest radiation nervous system  problems like myasthenia gravis or Guillain-Barre syndrome an unusual or allergic reaction to pembrolizumab, other medicines, foods, dyes, or preservatives pregnant or trying to get pregnant breast-feeding How should I use this medication? This medicine is for infusion into a vein. It is given by a health care professional in a hospital or clinic setting. A special MedGuide will be given to you before each treatment. Be sure to read this information carefully each time. Talk to your pediatrician regarding the use of this medicine in children. While this drug may be prescribed for children as young as 6 months for selected conditions, precautions do apply. Overdosage: If you think you have taken too much of this medicine contact a poison control center or emergency room at once. NOTE: This medicine is only for you. Do not share this medicine with others. What if I miss a dose? It is important not to miss your dose. Call your doctor or health care professional if you are unable to keep an appointment. What may interact with this medication? Interactions have not been studied. This list may not describe all possible interactions. Give your health care provider a list of all the medicines, herbs, non-prescription drugs, or dietary supplements you use. Also tell them if you smoke, drink alcohol, or use illegal drugs. Some items may interact with your medicine. What should I watch for while using this medication? Your condition will be monitored carefully while you are receiving this medicine. You may need blood work done while you are taking this medicine. Do not become pregnant while taking this medicine or for 4 months after stopping it. Women should inform their doctor if they wish to become pregnant or think they might be pregnant. There is a potential for serious side effects to an unborn child. Talk to your health care professional or pharmacist for more information. Do not breast-feed an infant  while taking this medicine or for 4 months after the last dose. What side effects may I notice from receiving this medication? Side effects that you should report to your doctor or health care professional as soon as possible: allergic reactions like skin rash, itching or hives, swelling of the face, lips, or tongue bloody or black, tarry breathing problems changes in vision chest pain chills confusion constipation cough diarrhea dizziness or feeling faint or lightheaded fast or irregular heartbeat fever flushing joint pain low blood counts - this medicine may decrease the number of white blood cells, red blood cells and platelets. You may be at increased risk for infections and bleeding. muscle pain muscle weakness pain, tingling, numbness in the hands or feet persistent headache redness, blistering, peeling or loosening of the skin, including inside the mouth signs and symptoms of high blood sugar such as dizziness; dry mouth; dry skin; fruity breath; nausea; stomach pain; increased hunger or thirst; increased urination signs and symptoms of kidney injury like trouble passing urine or change in the amount of urine signs and symptoms of liver injury like dark urine, light-colored stools, loss of appetite, nausea, right upper belly pain, yellowing of the eyes or skin sweating swollen lymph nodes weight loss Side effects that usually do not require medical attention (report to your doctor or health care professional if they continue   or are bothersome): decreased appetite hair loss tiredness This list may not describe all possible side effects. Call your doctor for medical advice about side effects. You may report side effects to FDA at 1-800-FDA-1088. Where should I keep my medication? This drug is given in a hospital or clinic and will not be stored at home. NOTE: This sheet is a summary. It may not cover all possible information. If you have questions about this medicine, talk  to your doctor, pharmacist, or health care provider.  2023 Elsevier/Gold Standard (2021-10-10 00:00:00)  

## 2022-04-15 NOTE — Progress Notes (Signed)
Pt presents today for Keytruda per provider's order. Vital signs and other labs WNL for treatment. Pt's creatinine is 1.91, Dr.K aware no additional fluids at time. Okay to proceed with treatment today per Dr.K.  Beryle Flock given today per MD orders. Tolerated infusion without adverse affects. Vital signs stable. No complaints at this time. Discharged from clinic ambulatory in stable condition. Alert and oriented x 3. F/U with [facility] as scheduled.

## 2022-04-29 ENCOUNTER — Other Ambulatory Visit: Payer: Self-pay | Admitting: Cardiology

## 2022-05-05 DIAGNOSIS — I2510 Atherosclerotic heart disease of native coronary artery without angina pectoris: Secondary | ICD-10-CM | POA: Diagnosis not present

## 2022-05-05 DIAGNOSIS — K219 Gastro-esophageal reflux disease without esophagitis: Secondary | ICD-10-CM | POA: Diagnosis not present

## 2022-05-05 DIAGNOSIS — I129 Hypertensive chronic kidney disease with stage 1 through stage 4 chronic kidney disease, or unspecified chronic kidney disease: Secondary | ICD-10-CM | POA: Diagnosis not present

## 2022-05-05 DIAGNOSIS — Z9889 Other specified postprocedural states: Secondary | ICD-10-CM | POA: Diagnosis not present

## 2022-05-05 DIAGNOSIS — E785 Hyperlipidemia, unspecified: Secondary | ICD-10-CM | POA: Diagnosis not present

## 2022-05-05 DIAGNOSIS — Z85038 Personal history of other malignant neoplasm of large intestine: Secondary | ICD-10-CM | POA: Diagnosis not present

## 2022-05-05 DIAGNOSIS — N2889 Other specified disorders of kidney and ureter: Secondary | ICD-10-CM | POA: Diagnosis not present

## 2022-05-05 DIAGNOSIS — C652 Malignant neoplasm of left renal pelvis: Secondary | ICD-10-CM | POA: Diagnosis not present

## 2022-05-05 DIAGNOSIS — N184 Chronic kidney disease, stage 4 (severe): Secondary | ICD-10-CM | POA: Diagnosis not present

## 2022-05-05 DIAGNOSIS — I739 Peripheral vascular disease, unspecified: Secondary | ICD-10-CM | POA: Diagnosis not present

## 2022-05-05 DIAGNOSIS — Z951 Presence of aortocoronary bypass graft: Secondary | ICD-10-CM | POA: Diagnosis not present

## 2022-05-05 DIAGNOSIS — E039 Hypothyroidism, unspecified: Secondary | ICD-10-CM | POA: Diagnosis not present

## 2022-05-05 DIAGNOSIS — R310 Gross hematuria: Secondary | ICD-10-CM | POA: Diagnosis not present

## 2022-05-05 DIAGNOSIS — D649 Anemia, unspecified: Secondary | ICD-10-CM | POA: Diagnosis not present

## 2022-05-05 DIAGNOSIS — R1909 Other intra-abdominal and pelvic swelling, mass and lump: Secondary | ICD-10-CM | POA: Diagnosis not present

## 2022-05-05 DIAGNOSIS — Z87891 Personal history of nicotine dependence: Secondary | ICD-10-CM | POA: Diagnosis not present

## 2022-05-05 DIAGNOSIS — C688 Malignant neoplasm of overlapping sites of urinary organs: Secondary | ICD-10-CM | POA: Diagnosis not present

## 2022-05-13 ENCOUNTER — Inpatient Hospital Stay (HOSPITAL_COMMUNITY): Payer: Medicare Other | Attending: Hematology

## 2022-05-13 ENCOUNTER — Inpatient Hospital Stay (HOSPITAL_COMMUNITY): Payer: Medicare Other

## 2022-05-13 VITALS — BP 115/58 | HR 77 | Temp 98.0°F | Resp 16 | Wt 157.6 lb

## 2022-05-13 DIAGNOSIS — C50911 Malignant neoplasm of unspecified site of right female breast: Secondary | ICD-10-CM | POA: Diagnosis not present

## 2022-05-13 DIAGNOSIS — C182 Malignant neoplasm of ascending colon: Secondary | ICD-10-CM | POA: Insufficient documentation

## 2022-05-13 DIAGNOSIS — Z170 Estrogen receptor positive status [ER+]: Secondary | ICD-10-CM | POA: Insufficient documentation

## 2022-05-13 DIAGNOSIS — Z95828 Presence of other vascular implants and grafts: Secondary | ICD-10-CM

## 2022-05-13 DIAGNOSIS — Z79899 Other long term (current) drug therapy: Secondary | ICD-10-CM | POA: Diagnosis not present

## 2022-05-13 DIAGNOSIS — C189 Malignant neoplasm of colon, unspecified: Secondary | ICD-10-CM

## 2022-05-13 DIAGNOSIS — Z5112 Encounter for antineoplastic immunotherapy: Secondary | ICD-10-CM | POA: Insufficient documentation

## 2022-05-13 DIAGNOSIS — C773 Secondary and unspecified malignant neoplasm of axilla and upper limb lymph nodes: Secondary | ICD-10-CM | POA: Diagnosis not present

## 2022-05-13 LAB — CBC WITH DIFFERENTIAL/PLATELET
Abs Immature Granulocytes: 0.35 10*3/uL — ABNORMAL HIGH (ref 0.00–0.07)
Basophils Absolute: 0.1 10*3/uL (ref 0.0–0.1)
Basophils Relative: 1 %
Eosinophils Absolute: 0.2 10*3/uL (ref 0.0–0.5)
Eosinophils Relative: 2 %
HCT: 29.3 % — ABNORMAL LOW (ref 36.0–46.0)
Hemoglobin: 8.9 g/dL — ABNORMAL LOW (ref 12.0–15.0)
Immature Granulocytes: 5 %
Lymphocytes Relative: 17 %
Lymphs Abs: 1.3 10*3/uL (ref 0.7–4.0)
MCH: 24.8 pg — ABNORMAL LOW (ref 26.0–34.0)
MCHC: 30.4 g/dL (ref 30.0–36.0)
MCV: 81.6 fL (ref 80.0–100.0)
Monocytes Absolute: 0.6 10*3/uL (ref 0.1–1.0)
Monocytes Relative: 8 %
Neutro Abs: 5.2 10*3/uL (ref 1.7–7.7)
Neutrophils Relative %: 67 %
Platelets: 227 10*3/uL (ref 150–400)
RBC: 3.59 MIL/uL — ABNORMAL LOW (ref 3.87–5.11)
RDW: 16.4 % — ABNORMAL HIGH (ref 11.5–15.5)
WBC: 7.7 10*3/uL (ref 4.0–10.5)
nRBC: 0 % (ref 0.0–0.2)

## 2022-05-13 LAB — COMPREHENSIVE METABOLIC PANEL
ALT: 12 U/L (ref 0–44)
AST: 15 U/L (ref 15–41)
Albumin: 3.4 g/dL — ABNORMAL LOW (ref 3.5–5.0)
Alkaline Phosphatase: 92 U/L (ref 38–126)
Anion gap: 8 (ref 5–15)
BUN: 27 mg/dL — ABNORMAL HIGH (ref 8–23)
CO2: 18 mmol/L — ABNORMAL LOW (ref 22–32)
Calcium: 9.3 mg/dL (ref 8.9–10.3)
Chloride: 111 mmol/L (ref 98–111)
Creatinine, Ser: 2.24 mg/dL — ABNORMAL HIGH (ref 0.44–1.00)
GFR, Estimated: 21 mL/min — ABNORMAL LOW (ref >60)
Glucose, Bld: 110 mg/dL — ABNORMAL HIGH (ref 70–99)
Potassium: 3.9 mmol/L (ref 3.5–5.1)
Sodium: 137 mmol/L (ref 135–145)
Total Bilirubin: 0.5 mg/dL (ref 0.3–1.2)
Total Protein: 6.8 g/dL (ref 6.5–8.1)

## 2022-05-13 LAB — TSH: TSH: 4.337 u[IU]/mL (ref 0.350–4.500)

## 2022-05-13 MED ORDER — SODIUM CHLORIDE 0.9% FLUSH
10.0000 mL | INTRAVENOUS | Status: DC | PRN
  Administered 2022-05-13: 10 mL

## 2022-05-13 MED ORDER — SODIUM CHLORIDE 0.9 % IV SOLN: INTRAVENOUS | Status: DC

## 2022-05-13 MED ORDER — SODIUM CHLORIDE 0.9 % IV SOLN
200.0000 mg | Freq: Once | INTRAVENOUS | Status: AC
  Administered 2022-05-13: 200 mg via INTRAVENOUS
  Filled 2022-05-13: qty 8

## 2022-05-13 MED ORDER — SODIUM CHLORIDE 0.9 % IV SOLN: Freq: Once | INTRAVENOUS | Status: AC

## 2022-05-13 MED ORDER — HEPARIN SOD (PORK) LOCK FLUSH 100 UNIT/ML IV SOLN
500.0000 [IU] | Freq: Once | INTRAVENOUS | Status: AC | PRN
  Administered 2022-05-13: 500 [IU]

## 2022-05-13 NOTE — Progress Notes (Signed)
Treatment given per orders. Patient tolerated it well without problems. Vitals stable and discharged home from clinic ambulatory. Follow up as scheduled.  

## 2022-05-13 NOTE — Patient Instructions (Signed)
[facility]  Discharge Instructions: Thank you for choosing [facility] to provide your oncology and hematology care.  If you have a lab appointment with the Cancer Center, please come in thru the Main Entrance and check in at the main information desk.  Wear comfortable clothing and clothing appropriate for easy access to any Portacath or PICC line.   We strive to give you quality time with your provider. You may need to reschedule your appointment if you arrive late (15 or more minutes).  Arriving late affects you and other patients whose appointments are after yours.  Also, if you miss three or more appointments without notifying the office, you may be dismissed from the clinic at the provider's discretion.      For prescription refill requests, have your pharmacy contact our office and allow 72 hours for refills to be completed.    Today you received the following chemotherapy and/or immunotherapy agents Keytruda   To help prevent nausea and vomiting after your treatment, we encourage you to take your nausea medication as directed.  BELOW ARE SYMPTOMS THAT SHOULD BE REPORTED IMMEDIATELY: *FEVER GREATER THAN 100.4 F (38 C) OR HIGHER *CHILLS OR SWEATING *NAUSEA AND VOMITING THAT IS NOT CONTROLLED WITH YOUR NAUSEA MEDICATION *UNUSUAL SHORTNESS OF BREATH *UNUSUAL BRUISING OR BLEEDING *URINARY PROBLEMS (pain or burning when urinating, or frequent urination) *BOWEL PROBLEMS (unusual diarrhea, constipation, pain near the anus) TENDERNESS IN MOUTH AND THROAT WITH OR WITHOUT PRESENCE OF ULCERS (sore throat, sores in mouth, or a toothache) UNUSUAL RASH, SWELLING OR PAIN  UNUSUAL VAGINAL DISCHARGE OR ITCHING   Items with * indicate a potential emergency and should be followed up as soon as possible or go to the Emergency Department if any problems should occur.  Please show the CHEMOTHERAPY ALERT CARD or IMMUNOTHERAPY ALERT CARD at check-in to the Emergency Department  and triage nurse.  Should you have questions after your visit or need to cancel or reschedule your appointment, please contact [facility] [phone]  and follow the prompts.  Office hours are 8:00 a.m. to 4:30 p.m. Monday - Friday. Please note that voicemails left after 4:00 p.m. may not be returned until the following business day.  We are closed weekends and major holidays. You have access to a nurse at all times for urgent questions. Please call the main number to the clinic [phone] and follow the prompts.  For any non-urgent questions, you may also contact your provider using MyChart. We now offer e-Visits for anyone 18 and older to request care online for non-urgent symptoms. For details visit [email or web address].   Also download the MyChart app! Go to the app store, search "MyChart", open the app, select , and log in with your MyChart username and password.  Masks are optional in the cancer centers. If you would like for your care team to wear a mask while they are taking care of you, please let them know. For doctor visits, patients may have with them one support person who is at least 83 years old. At this time, visitors are not allowed in the infusion area. Pembrolizumab injection What is this medication? PEMBROLIZUMAB (pem broe liz ue mab) is a monoclonal antibody. It is used to treat certain types of cancer. This medicine may be used for other purposes; ask your health care provider or pharmacist if you have questions. COMMON BRAND NAME(S): Keytruda What should I tell my care team before I take this medication? They need to   know if you have any of these conditions: autoimmune diseases like Crohn's disease, ulcerative colitis, or lupus have had or planning to have an allogeneic stem cell transplant (uses someone else's stem cells) history of organ transplant history of chest radiation nervous system problems like myasthenia gravis or Guillain-Barre  syndrome an unusual or allergic reaction to pembrolizumab, other medicines, foods, dyes, or preservatives pregnant or trying to get pregnant breast-feeding How should I use this medication? This medicine is for infusion into a vein. It is given by a health care professional in a hospital or clinic setting. A special MedGuide will be given to you before each treatment. Be sure to read this information carefully each time. Talk to your pediatrician regarding the use of this medicine in children. While this drug may be prescribed for children as young as 6 months for selected conditions, precautions do apply. Overdosage: If you think you have taken too much of this medicine contact a poison control center or emergency room at once. NOTE: This medicine is only for you. Do not share this medicine with others. What if I miss a dose? It is important not to miss your dose. Call your doctor or health care professional if you are unable to keep an appointment. What may interact with this medication? Interactions have not been studied. This list may not describe all possible interactions. Give your health care provider a list of all the medicines, herbs, non-prescription drugs, or dietary supplements you use. Also tell them if you smoke, drink alcohol, or use illegal drugs. Some items may interact with your medicine. What should I watch for while using this medication? Your condition will be monitored carefully while you are receiving this medicine. You may need blood work done while you are taking this medicine. Do not become pregnant while taking this medicine or for 4 months after stopping it. Women should inform their doctor if they wish to become pregnant or think they might be pregnant. There is a potential for serious side effects to an unborn child. Talk to your health care professional or pharmacist for more information. Do not breast-feed an infant while taking this medicine or for 4 months after  the last dose. What side effects may I notice from receiving this medication? Side effects that you should report to your doctor or health care professional as soon as possible: allergic reactions like skin rash, itching or hives, swelling of the face, lips, or tongue bloody or black, tarry breathing problems changes in vision chest pain chills confusion constipation cough diarrhea dizziness or feeling faint or lightheaded fast or irregular heartbeat fever flushing joint pain low blood counts - this medicine may decrease the number of white blood cells, red blood cells and platelets. You may be at increased risk for infections and bleeding. muscle pain muscle weakness pain, tingling, numbness in the hands or feet persistent headache redness, blistering, peeling or loosening of the skin, including inside the mouth signs and symptoms of high blood sugar such as dizziness; dry mouth; dry skin; fruity breath; nausea; stomach pain; increased hunger or thirst; increased urination signs and symptoms of kidney injury like trouble passing urine or change in the amount of urine signs and symptoms of liver injury like dark urine, light-colored stools, loss of appetite, nausea, right upper belly pain, yellowing of the eyes or skin sweating swollen lymph nodes weight loss Side effects that usually do not require medical attention (report to your doctor or health care professional if they continue or are   bothersome): decreased appetite hair loss tiredness This list may not describe all possible side effects. Call your doctor for medical advice about side effects. You may report side effects to FDA at 1-800-FDA-1088. Where should I keep my medication? This drug is given in a hospital or clinic and will not be stored at home. NOTE: This sheet is a summary. It may not cover all possible information. If you have questions about this medicine, talk to your doctor, pharmacist, or health care  provider.  2023 Elsevier/Gold Standard (2021-10-10 00:00:00)  

## 2022-05-13 NOTE — Progress Notes (Signed)
Pt presents today for Keytruda per provider's order. Pt's creatinine is 2.24 today. Dr.K made aware and stated to give 539m over an hour.

## 2022-05-18 ENCOUNTER — Other Ambulatory Visit: Payer: Self-pay | Admitting: Cardiology

## 2022-05-18 ENCOUNTER — Other Ambulatory Visit (HOSPITAL_COMMUNITY): Payer: Self-pay | Admitting: Hematology

## 2022-05-19 ENCOUNTER — Encounter (HOSPITAL_COMMUNITY): Payer: Self-pay | Admitting: Hematology

## 2022-05-21 ENCOUNTER — Other Ambulatory Visit: Payer: Self-pay | Admitting: Cardiology

## 2022-05-28 ENCOUNTER — Ambulatory Visit (HOSPITAL_COMMUNITY)
Admission: RE | Admit: 2022-05-28 | Discharge: 2022-05-28 | Disposition: A | Discharge status: Home / Self Care | Payer: Medicare Other | Source: Ambulatory Visit | Attending: Hematology | Admitting: Hematology

## 2022-05-28 DIAGNOSIS — N200 Calculus of kidney: Secondary | ICD-10-CM | POA: Diagnosis not present

## 2022-05-28 DIAGNOSIS — K5730 Diverticulosis of large intestine without perforation or abscess without bleeding: Secondary | ICD-10-CM | POA: Diagnosis not present

## 2022-05-28 DIAGNOSIS — R911 Solitary pulmonary nodule: Secondary | ICD-10-CM | POA: Diagnosis not present

## 2022-05-28 DIAGNOSIS — C189 Malignant neoplasm of colon, unspecified: Secondary | ICD-10-CM | POA: Insufficient documentation

## 2022-06-04 ENCOUNTER — Inpatient Hospital Stay (HOSPITAL_COMMUNITY): Payer: Medicare Other | Attending: Hematology

## 2022-06-04 ENCOUNTER — Inpatient Hospital Stay (HOSPITAL_BASED_OUTPATIENT_CLINIC_OR_DEPARTMENT_OTHER): Payer: Medicare Other | Admitting: Hematology

## 2022-06-04 ENCOUNTER — Inpatient Hospital Stay (HOSPITAL_COMMUNITY): Payer: Medicare Other

## 2022-06-04 VITALS — BP 143/56 | HR 61 | Temp 97.3°F | Resp 18

## 2022-06-04 DIAGNOSIS — N189 Chronic kidney disease, unspecified: Secondary | ICD-10-CM | POA: Insufficient documentation

## 2022-06-04 DIAGNOSIS — C50911 Malignant neoplasm of unspecified site of right female breast: Secondary | ICD-10-CM | POA: Insufficient documentation

## 2022-06-04 DIAGNOSIS — Z95828 Presence of other vascular implants and grafts: Secondary | ICD-10-CM

## 2022-06-04 DIAGNOSIS — C189 Malignant neoplasm of colon, unspecified: Secondary | ICD-10-CM

## 2022-06-04 DIAGNOSIS — C679 Malignant neoplasm of bladder, unspecified: Secondary | ICD-10-CM | POA: Insufficient documentation

## 2022-06-04 DIAGNOSIS — D509 Iron deficiency anemia, unspecified: Secondary | ICD-10-CM | POA: Insufficient documentation

## 2022-06-04 DIAGNOSIS — Z170 Estrogen receptor positive status [ER+]: Secondary | ICD-10-CM | POA: Diagnosis not present

## 2022-06-04 DIAGNOSIS — Z79811 Long term (current) use of aromatase inhibitors: Secondary | ICD-10-CM | POA: Diagnosis not present

## 2022-06-04 DIAGNOSIS — C182 Malignant neoplasm of ascending colon: Secondary | ICD-10-CM | POA: Diagnosis not present

## 2022-06-04 DIAGNOSIS — Z79899 Other long term (current) drug therapy: Secondary | ICD-10-CM | POA: Insufficient documentation

## 2022-06-04 DIAGNOSIS — C773 Secondary and unspecified malignant neoplasm of axilla and upper limb lymph nodes: Secondary | ICD-10-CM | POA: Diagnosis not present

## 2022-06-04 DIAGNOSIS — D631 Anemia in chronic kidney disease: Secondary | ICD-10-CM | POA: Insufficient documentation

## 2022-06-04 DIAGNOSIS — I129 Hypertensive chronic kidney disease with stage 1 through stage 4 chronic kidney disease, or unspecified chronic kidney disease: Secondary | ICD-10-CM | POA: Insufficient documentation

## 2022-06-04 DIAGNOSIS — Z5112 Encounter for antineoplastic immunotherapy: Secondary | ICD-10-CM | POA: Insufficient documentation

## 2022-06-04 DIAGNOSIS — D649 Anemia, unspecified: Secondary | ICD-10-CM

## 2022-06-04 LAB — IRON AND TIBC
Iron: 31 ug/dL (ref 28–170)
Saturation Ratios: 9 % — ABNORMAL LOW (ref 10.4–31.8)
TIBC: 357 ug/dL (ref 250–450)
UIBC: 326 ug/dL

## 2022-06-04 LAB — TSH: TSH: 4.231 u[IU]/mL (ref 0.350–4.500)

## 2022-06-04 LAB — CBC WITH DIFFERENTIAL/PLATELET
Abs Immature Granulocytes: 0.01 10*3/uL (ref 0.00–0.07)
Basophils Absolute: 0.1 10*3/uL (ref 0.0–0.1)
Basophils Relative: 2 %
Eosinophils Absolute: 0.1 10*3/uL (ref 0.0–0.5)
Eosinophils Relative: 1 %
HCT: 31.7 % — ABNORMAL LOW (ref 36.0–46.0)
Hemoglobin: 9.4 g/dL — ABNORMAL LOW (ref 12.0–15.0)
Immature Granulocytes: 0 %
Lymphocytes Relative: 24 %
Lymphs Abs: 1.2 10*3/uL (ref 0.7–4.0)
MCH: 24.4 pg — ABNORMAL LOW (ref 26.0–34.0)
MCHC: 29.7 g/dL — ABNORMAL LOW (ref 30.0–36.0)
MCV: 82.3 fL (ref 80.0–100.0)
Monocytes Absolute: 0.5 10*3/uL (ref 0.1–1.0)
Monocytes Relative: 9 %
Neutro Abs: 3.1 10*3/uL (ref 1.7–7.7)
Neutrophils Relative %: 64 %
Platelets: 233 10*3/uL (ref 150–400)
RBC: 3.85 MIL/uL — ABNORMAL LOW (ref 3.87–5.11)
RDW: 16.3 % — ABNORMAL HIGH (ref 11.5–15.5)
WBC: 4.9 10*3/uL (ref 4.0–10.5)
nRBC: 0 % (ref 0.0–0.2)

## 2022-06-04 LAB — COMPREHENSIVE METABOLIC PANEL
ALT: 9 U/L (ref 0–44)
AST: 15 U/L (ref 15–41)
Albumin: 3.5 g/dL (ref 3.5–5.0)
Alkaline Phosphatase: 83 U/L (ref 38–126)
Anion gap: 4 — ABNORMAL LOW (ref 5–15)
BUN: 15 mg/dL (ref 8–23)
CO2: 25 mmol/L (ref 22–32)
Calcium: 9.1 mg/dL (ref 8.9–10.3)
Chloride: 109 mmol/L (ref 98–111)
Creatinine, Ser: 1.9 mg/dL — ABNORMAL HIGH (ref 0.44–1.00)
GFR, Estimated: 26 mL/min — ABNORMAL LOW (ref >60)
Glucose, Bld: 111 mg/dL — ABNORMAL HIGH (ref 70–99)
Potassium: 3.6 mmol/L (ref 3.5–5.1)
Sodium: 138 mmol/L (ref 135–145)
Total Bilirubin: 0.6 mg/dL (ref 0.3–1.2)
Total Protein: 6.4 g/dL — ABNORMAL LOW (ref 6.5–8.1)

## 2022-06-04 LAB — FERRITIN: Ferritin: 10 ng/mL — ABNORMAL LOW (ref 11–307)

## 2022-06-04 MED ORDER — SODIUM CHLORIDE 0.9 % IV SOLN
200.0000 mg | Freq: Once | INTRAVENOUS | Status: AC
  Administered 2022-06-04: 200 mg via INTRAVENOUS
  Filled 2022-06-04: qty 8

## 2022-06-04 MED ORDER — SODIUM CHLORIDE 0.9 % IV SOLN: Freq: Once | INTRAVENOUS | Status: AC

## 2022-06-04 MED ORDER — SODIUM CHLORIDE 0.9% FLUSH
10.0000 mL | INTRAVENOUS | Status: DC | PRN
  Administered 2022-06-04: 10 mL

## 2022-06-04 MED ORDER — HEPARIN SOD (PORK) LOCK FLUSH 100 UNIT/ML IV SOLN
500.0000 [IU] | Freq: Once | INTRAVENOUS | Status: AC | PRN
  Administered 2022-06-04: 500 [IU]

## 2022-06-04 MED FILL — Ferumoxytol Inj 510 MG/17ML (30 MG/ML) (Elemental Fe): INTRAVENOUS | Qty: 17 | Status: AC

## 2022-06-04 NOTE — Progress Notes (Signed)
Patient presents today for [facility] per providers order.  Vital signs and labs within parameters for treatment.  Patient has no new complaints at this time.  Keytruda given today per MD orders.  Stable during infusion without adverse affects.  Vital signs stable.  No complaints at this time.  Discharge from clinic ambulatory in stable condition.  Alert and oriented X 3.  Follow up with [facility] as scheduled.

## 2022-06-04 NOTE — Progress Notes (Signed)
Jessica Taylor, [place] [address]   CLINIC:  Medical Oncology/Hematology  PCP:  Antony Contras, MD [address] / [place] [address] [phone]   REASON FOR VISIT:  Follow-up for stage I right breast cancer and colon cancer  PRIOR THERAPY:  1. Robotic proximal colectomy on 10/11/2019. 2. Right breast lumpectomy and SLNB on 04/02/2020  NGS Results: MSI--high, Foundation 1 not sent  CURRENT THERAPY: Keytruda every 3 weeks  BRIEF ONCOLOGIC HISTORY:  Oncology History  Cancer of ascending colon s/p robotic proximal colectomy 10/11/2019  10/11/2019 Initial Diagnosis   Cancer of ascending colon s/p robotic proximal colectomy 10/11/2019   10/23/2019 Cancer Staging   Staging form: Colon and Rectum, AJCC 8th Edition - Clinical stage from 10/23/2019: Stage IVA (cT3, cN1c, cM1a) - Signed by Derek Jack, MD on 12/14/2019   05/14/2020 -  Chemotherapy   Patient is on Treatment Plan : COLORECTAL Pembrolizumab q21d     Malignant neoplasm of right female breast ([place])  04/23/2020 Initial Diagnosis   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) Staging form: Breast, AJCC 8th Edition - Clinical stage from 04/23/2020: Stage IA (cT1c, cN0(sn), cM0, G2, ER+, PR+, HER2-) - Signed by Derek Jack, MD on 04/23/2020   INTERVAL HISTORY:  Jessica Taylor, a 81 y.o. female, returns for routine follow-up  and consideration for next cycle of chemotherapy. [name] was last seen on 04/15/2022.  Due for cycle #35 of Keytruda today.   Overall, she tells me she has been feeling pretty well. She reports occasional diarrhea, and she denies cough.   Overall, she feels ready for next cycle of chemo today.    REVIEW OF SYSTEMS:  Review of Systems  Constitutional:  Negative for appetite change and fatigue.  Respiratory:  Positive for shortness of breath. Negative for cough.   Gastrointestinal:  Positive for diarrhea.  All other systems reviewed and are negative.   PAST MEDICAL/SURGICAL HISTORY:  Past Medical History:  Diagnosis Date   Abdominal aortic aneurysm (HCC)    Acid reflux    Anemia    Arthritis    knees , R shoulder - tx /w injection - 11/2014   Basal cell carcinoma (BCC) of dorsum of nose 2010   Resolved   Cancer ([place])    basal cell on nose   Colon cancer (HCC)    Coronary artery disease    Hypercholesteremia    Hypertension    Hypothyroidism    Lt Acute pyelonephritis 09/11/2018   MI, old 2000   Peripheral arterial disease ([place])    hhigh-grade ostial bilateral calcified iliac stenosis with claudication   Tobacco abuse    Vertigo    when lays on left side.   Past Surgical History:  Procedure Laterality Date   BREAST LUMPECTOMY WITH RADIOACTIVE SEED AND SENTINEL LYMPH NODE BIOPSY Bilateral 04/02/2020   Procedure: BILATERAL BREAST LUMPECTOMY WITH RADIOACTIVE SEED AND RIGHT SENTINEL LYMPH NODE BIOPSY AND  RIGHT TARGETED AXILLARY LYMPH NODE BIOPSY;  Surgeon: Erroll Luna, MD;  Location: [place];  Service: General;  Laterality: Bilateral;   CARDIAC CATHETERIZATION  5 stents   CARDIAC CATHETERIZATION N/A 01/20/2016   Procedure: Left Heart Cath and Coronary Angiography;  Surgeon: Peter M Martinique, MD;  Location: [place] [facility];  Service: Cardiovascular;  Laterality: N/A;   CATARACT EXTRACTION W/PHACO Left 05/24/2014   Procedure: CATARACT EXTRACTION PHACO AND  INTRAOCULAR LENS PLACEMENT (IOC);  Surgeon: Tonny Branch, MD;  Location: [facility];  Service: Ophthalmology;  Laterality: Left;  CDE:  9.30   CATARACT EXTRACTION W/PHACO Right 06/18/2014   Procedure: CATARACT EXTRACTION PHACO AND INTRAOCULAR LENS PLACEMENT RIGHT EYE CDE=10.84;  Surgeon: Tonny Branch, MD;  Location: [facility];  Service: Ophthalmology;  Laterality: Right;   CORONARY ARTERY BYPASS GRAFT N/A 01/24/2016   Procedure: CORONARY ARTERY BYPASS GRAFTING (CABG);  Surgeon: Gaye Pollack, MD;  Location: [place];  Service: Open Heart Surgery;  Laterality: N/A;   CORONARY STENT PLACEMENT     CORONARY STENT PLACEMENT  03/06/15   CFX DES   CYSTOSCOPY/URETEROSCOPY/HOLMIUM LASER/STENT PLACEMENT Left 09/12/2018   Procedure: CYSTOSCOPY/URETEROSCOPY/STENT PLACEMENT;  Surgeon: Ceasar Mons, MD;  Location: [facility];  Service: Urology;  Laterality: Left;   EYE SURGERY     LEFT HEART CATH AND CORS/GRAFTS ANGIOGRAPHY N/A 09/12/2019   Procedure: LEFT HEART CATH AND CORS/GRAFTS ANGIOGRAPHY;  Surgeon: Troy Sine, MD;  Location: [place] [facility];  Service: Cardiovascular;  Laterality: N/A;   LEFT HEART CATHETERIZATION WITH CORONARY ANGIOGRAM N/A 03/06/2015   Procedure: LEFT HEART CATHETERIZATION WITH CORONARY ANGIOGRAM;  Surgeon: Lorretta Harp, MD;  Location: [facility];  Service: Cardiovascular;  Laterality: N/A;   PARTIAL KNEE ARTHROPLASTY Right 02/04/2015   Procedure: UNICOMPARTMENTAL KNEE;  Surgeon: Dorna Leitz, MD;  Location: [place];  Service: Orthopedics;  Laterality: Right;   PERIPHERAL VASCULAR CATHETERIZATION Bilateral 05/13/2015   Procedure: Lower Extremity Angiography;  Surgeon: Lorretta Harp, MD;  Location: [place] [facility];  Service: Cardiovascular;  Laterality: Bilateral;   PERIPHERAL VASCULAR CATHETERIZATION N/A 05/13/2015   Procedure: Abdominal Aortogram;  Surgeon: Lorretta Harp, MD;  Location: [place] [facility];  Service: Cardiovascular;  Laterality: N/A;   PERIPHERAL VASCULAR  CATHETERIZATION Bilateral 06/27/2015   Procedure: Peripheral Vascular Intervention;  Surgeon: Lorretta Harp, MD;  Location: [place] [facility];  Service: Cardiovascular;  Laterality: Bilateral;  ILIACS   PERIPHERAL VASCULAR CATHETERIZATION Bilateral 06/27/2015   Procedure: Peripheral Vascular Atherectomy;  Surgeon: Lorretta Harp, MD;  Location: [place] [facility];  Service: Cardiovascular;  Laterality: Bilateral;   PORTACATH PLACEMENT Left 05/24/2020   Procedure: INSERTION PORT-A-CATH;  Surgeon: Aviva Signs, MD;  Location[facility];  Service: General;  Laterality: Left;   TEE WITHOUT CARDIOVERSION N/A 01/24/2016   Procedure: TRANSESOPHAGEAL ECHOCARDIOGRAM (TEE);  Surgeon: Gaye Pollack, MD;  Location: [place];  Service: Open Heart Surgery;  Laterality: N/A;   TONSILLECTOMY     age 57   TUBAL LIGATION      SOCIAL HISTORY:  Social History   Socioeconomic History   Marital status: Married    Spouse name: Tommy   Number of children: 4   Years of education: Not on file   Highest education level: Not on file  Occupational History   Occupation: retired    Comment: worked as Presenter, broadcasting for [employer]  Tobacco Use   Smoking status: Former    Packs/day: 1.50    Years:  42.00    Total pack years: 63.00    Types: Cigarettes    Quit date: 05/19/1999    Years since quitting: 23.0   Smokeless tobacco: Never  Vaping Use   Vaping Use: Never used  Substance and Sexual Activity   Alcohol use: No   Drug use: No   Sexual activity: Yes    Birth control/protection: Surgical  Other Topics Concern   Not on file  Social History Narrative   Not on file   Social Determinants of Health   Financial Resource Strain: Medium Risk (02/18/2021)   Overall Financial Resource Strain (CARDIA)    Difficulty of Paying Living Expenses: Somewhat hard  Food Insecurity: No Food Insecurity (02/18/2021)   Hunger Vital Sign    Worried About Running Out of Food in the Last Year: Never true    Ran Out of  Food in the Last Year: Never true  Transportation Needs: No Transportation Needs (02/18/2021)   PRAPARE - Hydrologist (Medical): No    Lack of Transportation (Non-Medical): No  Physical Activity: Inactive (02/18/2021)   Exercise Vital Sign    Days of Exercise per Week: 0 days    Minutes of Exercise per Session: 0 min  Stress: No Stress Concern Present (02/18/2021)   [place]    Feeling of Stress : Not at all  Social Connections: Moderately Isolated (02/18/2021)   Social Connection and Isolation Panel [NHANES]    Frequency of Communication with Friends and Family: More than three times a week    Frequency of Social Gatherings with Friends and Family: Three times a week    Attends Religious Services: Never    Active Member of Clubs or Organizations: No    Attends Archivist Meetings: Never    Marital Status: Married  Human resources officer Violence: Not At Risk (02/18/2021)   Humiliation, Afraid, Rape, and Kick questionnaire    Fear of Current or Ex-Partner: No    Emotionally Abused: No    Physically Abused: No    Sexually Abused: No    FAMILY HISTORY:  Family History  Problem Relation Age of Onset   Ovarian cancer Mother 67   Cancer Father 63       unsure of which kind, "it was in his glands"   Hypertension Maternal Grandmother    Stroke Maternal Grandfather    Hypertension Son    Brain cancer Sister    Hypertension Daughter    Heart attack Neg Hx    Colon cancer Neg Hx    Esophageal cancer Neg Hx    Inflammatory bowel disease Neg Hx    Liver disease Neg Hx    Pancreatic cancer Neg Hx    Rectal cancer Neg Hx    Stomach cancer Neg Hx     CURRENT MEDICATIONS:  Current Outpatient Medications  Medication Sig Dispense Refill   amLODipine (NORVASC) 5 MG tablet TAKE 1 TABLET BY MOUTH ONCE A DAY. 30 tablet 6   anastrozole (ARIMIDEX) 1 MG tablet TAKE 1 TABLET BY MOUTH ONCE DAILY.  30 tablet 6   aspirin EC 81 MG tablet Take 81 mg by mouth every evening.      atorvastatin (LIPITOR) 40 MG tablet TAKE (1) TABLET BY MOUTH ONCE DAILY. KEEP FUTURE APPOINTMENTS FOR ANY FURTHER REFILLS. 90 tablet 3   clopidogrel (PLAVIX) 75 MG tablet Take 1 tablet (75 mg total) by mouth daily. FILLS. 90 tablet 3  esomeprazole (NEXIUM) 20 MG capsule Take 20 mg by mouth daily.      HYDROcodone-acetaminophen (NORCO/VICODIN) 5-325 MG tablet Take 1 tablet by mouth daily. 15 tablet 0   hydrOXYzine (ATARAX) 25 MG tablet TAKE (1) TABLET BY MOUTH AT BEDTIME AS NEEDED 30 tablet 0   levothyroxine (SYNTHROID) 25 MCG tablet Take 25 mcg by mouth daily.     metoprolol tartrate (LOPRESSOR) 25 MG tablet TAKE 1 TABLET BY MOUTH TWICE DAILY. 160 tablet 3   Pembrolizumab (KEYTRUDA IV) Inject 200 mg into the vein every 21 ( twenty-one) days.      pembrolizumab (KEYTRUDA) 100 MG/4ML SOLN See admin instructions.     Vitamin D, Ergocalciferol, (DRISDOL) 1.25 MG (50000 UNIT) CAPS capsule TAKE 1 CAPSULE BY MOUTH ONCE A WEEK. 4 capsule 0   lidocaine-prilocaine (EMLA) cream Apply to affected area once (Patient not taking: Reported on 06/04/2022) 30 g 3   nitroGLYCERIN (NITROSTAT) 0.4 MG SL tablet Place 1 tablet (0.4 mg total) under the tongue every 5 (five) minutes as needed for chest pain. (Patient not taking: Reported on 06/04/2022) 25 tablet prn   prochlorperazine (COMPAZINE) 10 MG tablet Take 1 tablet (10 mg total) by mouth every 6 (six) hours as needed (Nausea or vomiting). (Patient not taking: Reported on 06/04/2022) 30 tablet 1   No current facility-administered medications for this visit.    ALLERGIES:  Allergies  Allergen Reactions   Crab [Shellfish Allergy] Nausea And Vomiting    Throws up violently   Ezetimibe Diarrhea   Keflex [Cephalexin]     Caused sores   Other Nausea And Vomiting    Shrimp    PHYSICAL EXAM:  Performance status (ECOG): 1 - Symptomatic but completely ambulatory  There were no vitals  filed for this visit. Wt Readings from Last 3 Encounters:  06/04/22 152 lb 8 oz (69.2 kg)  05/13/22 157 lb 9.6 oz (71.5 kg)  04/15/22 154 lb 5.2 oz (70 kg)   Physical Exam Vitals reviewed.  Constitutional:      Appearance: Normal appearance.  Cardiovascular:     Rate and Rhythm: Normal rate and regular rhythm.     Pulses: Normal pulses.     Heart sounds: Normal heart sounds.  Pulmonary:     Effort: Pulmonary effort is normal.     Breath sounds: Normal breath sounds.  Neurological:     General: No focal deficit present.     Mental Status: She is alert and oriented to person, place, and time.  Psychiatric:        Mood and Affect: Mood normal.        Behavior: Behavior normal.     LABORATORY DATA:  I have reviewed the labs as listed.     Latest Ref Rng & Units 06/04/2022   12:08 PM 05/13/2022   12:42 PM 04/15/2022   12:10 PM  CBC  WBC 4.0 - 10.5 K/uL 4.9  7.7  5.0   Hemoglobin 12.0 - 15.0 g/dL 9.4  8.9  10.4   Hematocrit 36.0 - 46.0 % 31.7  29.3  34.2   Platelets 150 - 400 K/uL 233  227  204       Latest Ref Rng & Units 06/04/2022   12:08 PM 05/13/2022   12:42 PM 04/15/2022   12:10 PM  CMP  Glucose 70 - 99 mg/dL 111  110  119   BUN 8 - 23 mg/dL _0 Creatinine 0.44 - 1.00 mg/dL 1.90  2.24  1.91   Sodium 135 - 145 mmol/L 138  137  138   Potassium 3.5 - 5.1 mmol/L 3.6  3.9  3.5   Chloride 98 - 111 mmol/L 109  111  109   CO2 22 - 32 mmol/L _0 Calcium 8.9 - 10.3 mg/dL 9.1  9.3  9.3   Total Protein 6.5 - 8.1 g/dL 6.4  6.8  6.9   Total Bilirubin 0.3 - 1.2 mg/dL 0.6  0.5  0.8   Alkaline Phos 38 - 126 U/L 83  92  85   AST 15 - 41 U/L _1 ALT 0 - 44 U/L _2 DIAGNOSTIC IMAGING:  I have independently reviewed the scans and discussed with the patient. CT CHEST ABDOMEN PELVIS WO CONTRAST  Result Date: 05/28/2022 CLINICAL DATA:  Metastatic colon cancer restaging, additional history of right breast cancer * Tracking Code: BO * EXAM: CT  CHEST, ABDOMEN AND PELVIS WITHOUT CONTRAST TECHNIQUE: Multidetector CT imaging of the chest, abdomen and pelvis was performed following the standard protocol without IV contrast. RADIATION DOSE REDUCTION: This exam was performed according to the departmental dose-optimization program which includes automated exposure control, adjustment of the mA and/or kV according to patient size and/or use of iterative reconstruction technique. COMPARISON:  03/20/2022, CT chest abdomen pelvis, 01/19/2022 FINDINGS: CT CHEST FINDINGS Cardiovascular: Left chest port catheter. Aortic atherosclerosis. Normal heart size. Three-vessel coronary artery calcifications. No pericardial effusion. Mediastinum/Nodes: No enlarged mediastinal, hilar, or axillary lymph nodes. Thyroid gland, trachea, and esophagus demonstrate no significant findings. Lungs/Pleura: Unchanged dependent bibasilar ground-glass. Unchanged 0.4 cm nodule of the dependent superior segment left lower lobe, stable and benign (series 3, image 69). No pleural effusion or pneumothorax. Musculoskeletal: No chest wall mass or suspicious osseous lesions identified. Postoperative findings of right lumpectomy (series 2, image 42). CT ABDOMEN PELVIS FINDINGS Hepatobiliary: No solid liver abnormality is seen. No gallstones, gallbladder wall thickening, or biliary dilatation. Pancreas: Unremarkable. No pancreatic ductal dilatation or surrounding inflammatory changes. Spleen: Normal in size without significant abnormality. Adrenals/Urinary Tract: Adrenal glands are unremarkable. Interval placement of left-sided double-J ureteral stent, with formed pigtails in the left renal pelvis and urinary bladder. Previously noted left hydronephrosis and hyperdensity within the left renal collecting systems is resolved. No calculi or obvious renal mass. Small nonobstructive calculi of the superior pole of the left kidney. Bladder is unremarkable. Stomach/Bowel: Stomach is within normal limits.  Status post right hemicolectomy and reanastomosis. No evidence of bowel wall thickening, distention, or inflammatory changes. Descending and sigmoid diverticulosis. Vascular/Lymphatic: Aortic atherosclerosis. Aneurysm of the infrarenal abdominal aorta measuring up to 3.2 x 3.1 cm. Bilateral common iliac artery stents. No enlarged abdominal or pelvic lymph nodes. Reproductive: No mass or other abnormality. Other: No abdominal wall hernia or abnormality. No ascites. Musculoskeletal: No acute osseous findings. IMPRESSION: 1. Status post right hemicolectomy and right breast lumpectomy. No noncontrast evidence of metastatic disease to the chest, abdomen, or pelvis. 2. Interval placement of left-sided double-J ureteral stent, with formed pigtails in the left renal pelvis and urinary bladder. Previously noted left hydronephrosis and hyperdensity within the left renal collecting systems is resolved. 3. Nonobstructive left nephrolithiasis. 4. Diverticulosis without evidence of acute diverticulitis. 5. Aneurysm of the infrarenal abdominal aorta measuring up to 3.2 x 3.1 cm. Recommend follow-up ultrasound every 3 years if not otherwise imaged and clinically appropriate. This recommendation follows ACR consensus guidelines: White Paper of the ACR  Incidental Findings Committee II on Vascular Findings. J Am Coll Radiol 2013; 10:789-794. Aortic Atherosclerosis (ICD10-I70.0). Electronically Signed   By: Delanna Ahmadi M.D.   On: 05/28/2022 09:44     ASSESSMENT:  1.  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  Right hemicolectomy on 10/11/2019, grade 3 adenocarcinoma, negative margins, positive LVSI, 2 tumor deposits, 0/15 lymph nodes positive, PT3PN1C. 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  MLH1 hyper methylation present. -PET scan in December 2020 showed lymph node in the right axillary region.  Biopsy consistent with  metastatic colon cancer. -Last CEA was 10.7 on 11/22/2019. -Right axillary lymph node excision on 04/02/2020 consistent with metastatic carcinoma from colon cancer. -Pembrolizumab started on 05/14/2020. -PET scan on 08/05/2020 shows complete resolution of lymphadenopathy.  No evidence of new areas of uptake.  Mild vague residual hypermetabolism in the right axilla without soft tissue mass. -PET scan on 12/23/2020 with no evidence of recurrence.   2.  Stage I (PT1CPN0) right Breast, Grade 2 Invasive Lobular Carcinoma: -MRI of the breast on 12/25/2019 showed 1 cm mass behind the right areola with a suspicious mass in the left breast. -Right breast lumpectomy on 04/02/2020 shows invasive lobular carcinoma, grade 2, 1.2 cm.  Resection margins are negative.  Negative LVSI.  2 sentinel lymph nodes were negative for carcinoma.  ER/PR 100% positive, HER-2 negative, Ki-67 10%.   PLAN:  1.  Stage IV colon cancer to the right axillary lymph node: - CT CAP (05/28/2022): No noncontrast evidence of metastatic disease in the chest, abdomen or pelvis.  Interval placement of left-sided double-J ureteral stent.  Previously noted left hydronephrosis and hyperdensity within the left renal collecting system is resolved. - She is tolerating Keytruda very well.  No immunotherapy related side effects. - Reviewed labs today which showed normal LFTs and CBC. - Proceed with Keytruda today and in 3 weeks.  RTC 6 weeks for follow-up.   2.  Right breast invasive lobular carcinoma, grade 2: - Continue anastrozole daily. - Mammogram on 03/31/2022 was BI-RADS Category 1.   3.  Osteoporosis: - Continue vitamin D 50,000 units weekly.   4.  Left renal pelvis high-grade urothelial carcinoma: - She had a biopsy by Dr. Rosana Hoes at [facility] which showed high-grade urothelial carcinoma. - She is planning to meet urology at [facility] for left nephroureterectomy.   5.  Normocytic anemia: - CBC shows hemoglobin 9.4 and MCV  82.3.  Ferritin is 10 and percent saturation 9. - Combination anemia from iron deficiency and CKD. - As she is planning to have surgery soon, it is recommended that she receive Feraheme x2.  We discussed side effects in detail.   Orders placed this encounter:  No orders of the defined types were placed in this encounter.    Derek Jack, MD [place] [phone]   I, Thana Ates, am acting as a scribe for Dr. Derek Jack.  I, Derek Jack MD, have reviewed the above documentation for accuracy and completeness, and I agree with the above.

## 2022-06-04 NOTE — Patient Instructions (Signed)
[place] at [facility] Discharge Instructions  You were seen and examined today by Dr. Delton Coombes.  Dr. Delton Coombes discussed your most recent lab work and CT scan which revealed that your cancer is stable. Proceed with treatment today and in 3 weeks.  Follow-up as scheduled.  Thank you for choosing [place] at [facility] to provide your oncology and hematology care.  To afford each patient quality time with our provider, please arrive at least 15 minutes before your scheduled appointment time.   If you have a lab appointment with the [facility] please come in thru the Main Entrance and check in at the main information desk.  You need to re-schedule your appointment should you arrive 10 or more minutes late.  We strive to give you quality time with our providers, and arriving late affects you and other patients whose appointments are after yours.  Also, if you no show three or more times for appointments you may be dismissed from the clinic at the providers discretion.     Again, thank you for choosing [facility].  Our hope is that these requests will decrease the amount of time that you wait before being seen by our physicians.       _____________________________________________________________  Should you have questions after your visit to [facility], please contact our office at [phone] and follow the prompts.  Our office hours are 8:00 a.m. and 4:30 p.m. Monday - Friday.  Please note that voicemails left after 4:00 p.m. may not be returned until the following business day.  We are closed weekends and major holidays.  You do have access to a nurse 24-7, just call the main number to the clinic [phone] and do not press any options, hold on the line and a nurse will answer the phone.    For prescription refill requests, have your pharmacy contact our office and allow 72 hours.

## 2022-06-06 LAB — CEA: CEA: 9 ng/mL — ABNORMAL HIGH (ref 0.0–4.7)

## 2022-06-09 ENCOUNTER — Inpatient Hospital Stay (HOSPITAL_COMMUNITY): Payer: Medicare Other

## 2022-06-09 VITALS — BP 122/53 | HR 59 | Temp 97.5°F | Resp 18

## 2022-06-09 DIAGNOSIS — C50911 Malignant neoplasm of unspecified site of right female breast: Secondary | ICD-10-CM | POA: Diagnosis not present

## 2022-06-09 DIAGNOSIS — C679 Malignant neoplasm of bladder, unspecified: Secondary | ICD-10-CM | POA: Diagnosis not present

## 2022-06-09 DIAGNOSIS — Z170 Estrogen receptor positive status [ER+]: Secondary | ICD-10-CM | POA: Diagnosis not present

## 2022-06-09 DIAGNOSIS — C773 Secondary and unspecified malignant neoplasm of axilla and upper limb lymph nodes: Secondary | ICD-10-CM | POA: Diagnosis not present

## 2022-06-09 DIAGNOSIS — N189 Chronic kidney disease, unspecified: Secondary | ICD-10-CM | POA: Diagnosis not present

## 2022-06-09 DIAGNOSIS — Z79811 Long term (current) use of aromatase inhibitors: Secondary | ICD-10-CM | POA: Diagnosis not present

## 2022-06-09 DIAGNOSIS — I129 Hypertensive chronic kidney disease with stage 1 through stage 4 chronic kidney disease, or unspecified chronic kidney disease: Secondary | ICD-10-CM | POA: Diagnosis not present

## 2022-06-09 DIAGNOSIS — D509 Iron deficiency anemia, unspecified: Secondary | ICD-10-CM | POA: Diagnosis not present

## 2022-06-09 DIAGNOSIS — N183 Chronic kidney disease, stage 3 unspecified: Secondary | ICD-10-CM

## 2022-06-09 DIAGNOSIS — D508 Other iron deficiency anemias: Secondary | ICD-10-CM

## 2022-06-09 DIAGNOSIS — D631 Anemia in chronic kidney disease: Secondary | ICD-10-CM | POA: Diagnosis not present

## 2022-06-09 DIAGNOSIS — C182 Malignant neoplasm of ascending colon: Secondary | ICD-10-CM | POA: Diagnosis not present

## 2022-06-09 DIAGNOSIS — Z5112 Encounter for antineoplastic immunotherapy: Secondary | ICD-10-CM | POA: Diagnosis not present

## 2022-06-09 DIAGNOSIS — Z79899 Other long term (current) drug therapy: Secondary | ICD-10-CM | POA: Diagnosis not present

## 2022-06-09 MED ORDER — SODIUM CHLORIDE 0.9% FLUSH
10.0000 mL | Freq: Once | INTRAVENOUS | Status: AC
  Administered 2022-06-09: 10 mL via INTRAVENOUS

## 2022-06-09 MED ORDER — ACETAMINOPHEN 325 MG PO TABS
650.0000 mg | ORAL_TABLET | Freq: Once | ORAL | Status: AC
  Administered 2022-06-09: 650 mg via ORAL
  Filled 2022-06-09: qty 2

## 2022-06-09 MED ORDER — SODIUM CHLORIDE 0.9 % IV SOLN: Freq: Once | INTRAVENOUS | Status: AC

## 2022-06-09 MED ORDER — HEPARIN SOD (PORK) LOCK FLUSH 100 UNIT/ML IV SOLN
500.0000 [IU] | Freq: Once | INTRAVENOUS | Status: AC
  Administered 2022-06-09: 500 [IU] via INTRAVENOUS

## 2022-06-09 MED ORDER — LORATADINE 10 MG PO TABS
10.0000 mg | ORAL_TABLET | Freq: Once | ORAL | Status: AC
  Administered 2022-06-09: 10 mg via ORAL
  Filled 2022-06-09: qty 1

## 2022-06-09 MED ORDER — SODIUM CHLORIDE 0.9 % IV SOLN
510.0000 mg | Freq: Once | INTRAVENOUS | Status: AC
  Administered 2022-06-09: 510 mg via INTRAVENOUS
  Filled 2022-06-09 (×2): qty 17

## 2022-06-09 NOTE — Patient Instructions (Signed)
[facility]  Discharge Instructions: Thank you for choosing [facility] to provide your oncology and hematology care.  If you have a lab appointment with the [facility], please come in thru the Main Entrance and check in at the main information desk.  Wear comfortable clothing and clothing appropriate for easy access to any Portacath or PICC line.   We strive to give you quality time with your provider. You may need to reschedule your appointment if you arrive late (15 or more minutes).  Arriving late affects you and other patients whose appointments are after yours.  Also, if you miss three or more appointments without notifying the office, you may be dismissed from the clinic at the provider's discretion.      For prescription refill requests, have your pharmacy contact our office and allow 72 hours for refills to be completed.    Ferumoxytol Injection What is this medication? FERUMOXYTOL (FER ue MOX i tol) treats low levels of iron in your body (iron deficiency anemia). Iron is a mineral that plays an important role in making red blood cells, which carry oxygen from your lungs to the rest of your body. This medicine may be used for other purposes; ask your health care provider or pharmacist if you have questions. COMMON BRAND NAME(S): Feraheme What should I tell my care team before I take this medication? They need to know if you have any of these conditions: Anemia not caused by low iron levels High levels of iron in the blood Magnetic resonance imaging (MRI) test scheduled An unusual or allergic reaction to iron, other medications, foods, dyes, or preservatives Pregnant or trying to get pregnant Breast-feeding How should I use this medication? This medication is for injection into a vein. It is given in a hospital or clinic setting. Talk to your care team the use of this medication in children. Special care may be needed. Overdosage: If you think you have  taken too much of this medicine contact a poison control center or emergency room at once. NOTE: This medicine is only for you. Do not share this medicine with others. What if I miss a dose? It is important not to miss your dose. Call your care team if you are unable to keep an appointment. What may interact with this medication? Other iron products This list may not describe all possible interactions. Give your health care provider a list of all the medicines, herbs, non-prescription drugs, or dietary supplements you use. Also tell them if you smoke, drink alcohol, or use illegal drugs. Some items may interact with your medicine. What should I watch for while using this medication? Visit your care team regularly. Tell your care team if your symptoms do not start to get better or if they get worse. You may need blood work done while you are taking this medication. You may need to follow a special diet. Talk to your care team. Foods that contain iron include: whole grains/cereals, dried fruits, beans, or peas, leafy green vegetables, and organ meats (liver, kidney). What side effects may I notice from receiving this medication? Side effects that you should report to your care team as soon as possible: Allergic reactions--skin rash, itching, hives, swelling of the face, lips, tongue, or throat Low blood pressure--dizziness, feeling faint or lightheaded, blurry vision Shortness of breath Side effects that usually do not require medical attention (report to your care team if they continue or are bothersome): Flushing Headache Joint pain Muscle pain  Nausea Pain, redness, or irritation at injection site This list may not describe all possible side effects. Call your doctor for medical advice about side effects. You may report side effects to FDA at 1-800-FDA-1088. Where should I keep my medication? This medication is given in a hospital or clinic and will not be stored at home. NOTE: This sheet is  a summary. It may not cover all possible information. If you have questions about this medicine, talk to your doctor, pharmacist, or health care provider.  2023 Elsevier/Gold Standard (2021-04-04 00:00:00)      To help prevent nausea and vomiting after your treatment, we encourage you to take your nausea medication as directed.  BELOW ARE SYMPTOMS THAT SHOULD BE REPORTED IMMEDIATELY: *FEVER GREATER THAN 100.4 F (38 C) OR HIGHER *CHILLS OR SWEATING *NAUSEA AND VOMITING THAT IS NOT CONTROLLED WITH YOUR NAUSEA MEDICATION *UNUSUAL SHORTNESS OF BREATH *UNUSUAL BRUISING OR BLEEDING *URINARY PROBLEMS (pain or burning when urinating, or frequent urination) *BOWEL PROBLEMS (unusual diarrhea, constipation, pain near the anus) TENDERNESS IN MOUTH AND THROAT WITH OR WITHOUT PRESENCE OF ULCERS (sore throat, sores in mouth, or a toothache) UNUSUAL RASH, SWELLING OR PAIN  UNUSUAL VAGINAL DISCHARGE OR ITCHING   Items with * indicate a potential emergency and should be followed up as soon as possible or go to the Emergency Department if any problems should occur.  Please show the CHEMOTHERAPY ALERT CARD or IMMUNOTHERAPY ALERT CARD at check-in to the Emergency Department and triage nurse.  Should you have questions after your visit or need to cancel or reschedule your appointment, please contact [facility] [phone]  and follow the prompts.  Office hours are 8:00 a.m. to 4:30 p.m. Monday - Friday. Please note that voicemails left after 4:00 p.m. may not be returned until the following business day.  We are closed weekends and major holidays. You have access to a nurse at all times for urgent questions. Please call the main number to the clinic [phone] and follow the prompts.  For any non-urgent questions, you may also contact your provider using MyChart. We now offer e-Visits for anyone 16 and older to request care online for non-urgent symptoms. For details visit  [email or web address].   Also download the MyChart app! Go to the app store, search "MyChart", open the app, select [facility], and log in with your MyChart username and password.  Masks are optional in the cancer centers. If you would like for your care team to wear a mask while they are taking care of you, please let them know. For doctor visits, patients may have with them one support person who is at least 83 years old. At this time, visitors are not allowed in the infusion area.

## 2022-06-09 NOTE — Progress Notes (Signed)
Patient tolerated iron infusion with no complaints voiced. Port site clean and dry with good blood return noted before and after infusion. Band aid applied. VSS with discharge and left in satisfactory condition with no s/s of distress noted.   °

## 2022-06-11 ENCOUNTER — Other Ambulatory Visit (HOSPITAL_COMMUNITY): Payer: Self-pay | Admitting: Hematology

## 2022-06-11 DIAGNOSIS — N189 Chronic kidney disease, unspecified: Secondary | ICD-10-CM | POA: Diagnosis not present

## 2022-06-11 DIAGNOSIS — R310 Gross hematuria: Secondary | ICD-10-CM | POA: Diagnosis not present

## 2022-06-11 DIAGNOSIS — C689 Malignant neoplasm of urinary organ, unspecified: Secondary | ICD-10-CM | POA: Diagnosis not present

## 2022-06-15 ENCOUNTER — Other Ambulatory Visit: Payer: Self-pay

## 2022-06-16 ENCOUNTER — Other Ambulatory Visit: Payer: Self-pay

## 2022-06-16 ENCOUNTER — Inpatient Hospital Stay (HOSPITAL_COMMUNITY): Payer: Medicare Other

## 2022-06-16 VITALS — BP 139/67 | HR 66 | Temp 97.3°F | Resp 18

## 2022-06-16 DIAGNOSIS — C679 Malignant neoplasm of bladder, unspecified: Secondary | ICD-10-CM | POA: Diagnosis not present

## 2022-06-16 DIAGNOSIS — D631 Anemia in chronic kidney disease: Secondary | ICD-10-CM | POA: Diagnosis not present

## 2022-06-16 DIAGNOSIS — Z79811 Long term (current) use of aromatase inhibitors: Secondary | ICD-10-CM | POA: Diagnosis not present

## 2022-06-16 DIAGNOSIS — D508 Other iron deficiency anemias: Secondary | ICD-10-CM

## 2022-06-16 DIAGNOSIS — N183 Chronic kidney disease, stage 3 unspecified: Secondary | ICD-10-CM

## 2022-06-16 DIAGNOSIS — C773 Secondary and unspecified malignant neoplasm of axilla and upper limb lymph nodes: Secondary | ICD-10-CM | POA: Diagnosis not present

## 2022-06-16 DIAGNOSIS — C182 Malignant neoplasm of ascending colon: Secondary | ICD-10-CM | POA: Diagnosis not present

## 2022-06-16 DIAGNOSIS — Z79899 Other long term (current) drug therapy: Secondary | ICD-10-CM | POA: Diagnosis not present

## 2022-06-16 DIAGNOSIS — Z170 Estrogen receptor positive status [ER+]: Secondary | ICD-10-CM | POA: Diagnosis not present

## 2022-06-16 DIAGNOSIS — C50911 Malignant neoplasm of unspecified site of right female breast: Secondary | ICD-10-CM | POA: Diagnosis not present

## 2022-06-16 DIAGNOSIS — Z5112 Encounter for antineoplastic immunotherapy: Secondary | ICD-10-CM | POA: Diagnosis not present

## 2022-06-16 DIAGNOSIS — I129 Hypertensive chronic kidney disease with stage 1 through stage 4 chronic kidney disease, or unspecified chronic kidney disease: Secondary | ICD-10-CM | POA: Diagnosis not present

## 2022-06-16 DIAGNOSIS — D509 Iron deficiency anemia, unspecified: Secondary | ICD-10-CM | POA: Diagnosis not present

## 2022-06-16 DIAGNOSIS — N189 Chronic kidney disease, unspecified: Secondary | ICD-10-CM | POA: Diagnosis not present

## 2022-06-16 MED ORDER — LORATADINE 10 MG PO TABS
10.0000 mg | ORAL_TABLET | Freq: Once | ORAL | Status: AC
  Administered 2022-06-16: 10 mg via ORAL
  Filled 2022-06-16: qty 1

## 2022-06-16 MED ORDER — HEPARIN SOD (PORK) LOCK FLUSH 100 UNIT/ML IV SOLN
500.0000 [IU] | Freq: Once | INTRAVENOUS | Status: AC
  Administered 2022-06-16: 500 [IU] via INTRAVENOUS

## 2022-06-16 MED ORDER — SODIUM CHLORIDE 0.9 % IV SOLN
510.0000 mg | Freq: Once | INTRAVENOUS | Status: AC
  Administered 2022-06-16: 510 mg via INTRAVENOUS
  Filled 2022-06-16: qty 510

## 2022-06-16 MED ORDER — ACETAMINOPHEN 325 MG PO TABS
650.0000 mg | ORAL_TABLET | Freq: Once | ORAL | Status: AC
  Administered 2022-06-16: 650 mg via ORAL
  Filled 2022-06-16: qty 2

## 2022-06-16 MED ORDER — SODIUM CHLORIDE 0.9 % IV SOLN: Freq: Once | INTRAVENOUS | Status: AC

## 2022-06-16 MED ORDER — SODIUM CHLORIDE 0.9% FLUSH
10.0000 mL | Freq: Once | INTRAVENOUS | Status: AC
  Administered 2022-06-16: 10 mL via INTRAVENOUS

## 2022-06-16 NOTE — Progress Notes (Signed)
Patient presents today for Feraheme infusion per provider order.  Vital signs WNL.  Patient had no new complaints at this time.  Feraheme given today per MD orders.  Stable during infusion without adverse affects.  Vital signs stable.  Patient declines to stay the thirty minute post infusion wait time.  No complaints at this time.  Discharge from clinic ambulatory in stable condition.  Alert and oriented X 3.  Follow up with [facility] as scheduled.

## 2022-06-16 NOTE — Patient Instructions (Signed)
[facility]  Discharge Instructions: Thank you for choosing [facility] to provide your oncology and hematology care.  If you have a lab appointment with the [facility], please come in thru the Main Entrance and check in at the main information desk.  Wear comfortable clothing and clothing appropriate for easy access to any Portacath or PICC line.   We strive to give you quality time with your provider. You may need to reschedule your appointment if you arrive late (15 or more minutes).  Arriving late affects you and other patients whose appointments are after yours.  Also, if you miss three or more appointments without notifying the office, you may be dismissed from the clinic at the provider's discretion.      For prescription refill requests, have your pharmacy contact our office and allow 72 hours for refills to be completed.    Today you received the following chemotherapy and/or immunotherapy agents feraheme      To help prevent nausea and vomiting after your treatment, we encourage you to take your nausea medication as directed.  BELOW ARE SYMPTOMS THAT SHOULD BE REPORTED IMMEDIATELY: *FEVER GREATER THAN 100.4 F (38 C) OR HIGHER *CHILLS OR SWEATING *NAUSEA AND VOMITING THAT IS NOT CONTROLLED WITH YOUR NAUSEA MEDICATION *UNUSUAL SHORTNESS OF BREATH *UNUSUAL BRUISING OR BLEEDING *URINARY PROBLEMS (pain or burning when urinating, or frequent urination) *BOWEL PROBLEMS (unusual diarrhea, constipation, pain near the anus) TENDERNESS IN MOUTH AND THROAT WITH OR WITHOUT PRESENCE OF ULCERS (sore throat, sores in mouth, or a toothache) UNUSUAL RASH, SWELLING OR PAIN  UNUSUAL VAGINAL DISCHARGE OR ITCHING   Items with * indicate a potential emergency and should be followed up as soon as possible or go to the Emergency Department if any problems should occur.  Please show the CHEMOTHERAPY ALERT CARD or IMMUNOTHERAPY ALERT CARD at check-in to the Emergency  Department and triage nurse.  Should you have questions after your visit or need to cancel or reschedule your appointment, please contact [facility] [phone]  and follow the prompts.  Office hours are 8:00 a.m. to 4:30 p.m. Monday - Friday. Please note that voicemails left after 4:00 p.m. may not be returned until the following business day.  We are closed weekends and major holidays. You have access to a nurse at all times for urgent questions. Please call the main number to the clinic [phone] and follow the prompts.  For any non-urgent questions, you may also contact your provider using MyChart. We now offer e-Visits for anyone 92 and older to request care online for non-urgent symptoms. For details visit [email or web address].   Also download the MyChart app! Go to the app store, search "MyChart", open the app, select [facility], and log in with your MyChart username and password.  Masks are optional in the cancer centers. If you would like for your care team to wear a mask while they are taking care of you, please let them know. For doctor visits, patients may have with them one support person who is at least 83 years old. At this time, visitors are not allowed in the infusion area.

## 2022-06-23 ENCOUNTER — Other Ambulatory Visit: Payer: Self-pay

## 2022-06-23 ENCOUNTER — Telehealth: Payer: Self-pay | Admitting: *Deleted

## 2022-06-23 NOTE — Telephone Encounter (Signed)
I was asked today by Ermalinda Barrios, PAC today if I could help her pt and his mother to see if clearance had been done for Mrs. Hassell Done.  I did review the chart and I read ov notes from Dr. Marlou Porch who made note:   Preop cardiovascular exam She may proceed with Dr. Tresa Endo at [facility] urology with renal biopsy, possible nephrectomy.  She will be of low to moderate cardiac risk.  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  She will need to hold her Plavix for 5 to 7 days prior to surgery.  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  If this is not possible, she will need to hold her aspirin as well. Clearance will be given for the patient's future robotic colectomy but she will need to hold Plavix for at least 5 days prior to the procedure.   I will review with Dr. Marlou Porch for any final notes that he may want to make.   I did find a ph and fax # for the surgeon: Dr. Tacey Ruiz Hemal Hemal, Tacey Ruiz, MD Urology NPI: [id number] [place] [address] Ph# [phone] Fax# [phone]

## 2022-06-24 ENCOUNTER — Other Ambulatory Visit: Payer: Self-pay

## 2022-06-25 DIAGNOSIS — E785 Hyperlipidemia, unspecified: Secondary | ICD-10-CM | POA: Diagnosis not present

## 2022-06-25 DIAGNOSIS — I129 Hypertensive chronic kidney disease with stage 1 through stage 4 chronic kidney disease, or unspecified chronic kidney disease: Secondary | ICD-10-CM | POA: Diagnosis not present

## 2022-06-25 DIAGNOSIS — N189 Chronic kidney disease, unspecified: Secondary | ICD-10-CM | POA: Diagnosis not present

## 2022-06-25 DIAGNOSIS — Z01812 Encounter for preprocedural laboratory examination: Secondary | ICD-10-CM | POA: Diagnosis not present

## 2022-06-25 DIAGNOSIS — E039 Hypothyroidism, unspecified: Secondary | ICD-10-CM | POA: Diagnosis not present

## 2022-06-25 DIAGNOSIS — I7143 Infrarenal abdominal aortic aneurysm, without rupture: Secondary | ICD-10-CM | POA: Diagnosis not present

## 2022-06-25 DIAGNOSIS — C652 Malignant neoplasm of left renal pelvis: Secondary | ICD-10-CM | POA: Diagnosis not present

## 2022-06-25 DIAGNOSIS — I2510 Atherosclerotic heart disease of native coronary artery without angina pectoris: Secondary | ICD-10-CM | POA: Diagnosis not present

## 2022-06-25 DIAGNOSIS — Z951 Presence of aortocoronary bypass graft: Secondary | ICD-10-CM | POA: Diagnosis not present

## 2022-06-25 DIAGNOSIS — Z85038 Personal history of other malignant neoplasm of large intestine: Secondary | ICD-10-CM | POA: Diagnosis not present

## 2022-06-25 DIAGNOSIS — Z853 Personal history of malignant neoplasm of breast: Secondary | ICD-10-CM | POA: Diagnosis not present

## 2022-06-25 DIAGNOSIS — D509 Iron deficiency anemia, unspecified: Secondary | ICD-10-CM | POA: Diagnosis not present

## 2022-06-25 DIAGNOSIS — C689 Malignant neoplasm of urinary organ, unspecified: Secondary | ICD-10-CM | POA: Diagnosis not present

## 2022-06-25 DIAGNOSIS — I739 Peripheral vascular disease, unspecified: Secondary | ICD-10-CM | POA: Diagnosis not present

## 2022-06-26 ENCOUNTER — Inpatient Hospital Stay: Payer: Medicare Other

## 2022-06-26 ENCOUNTER — Inpatient Hospital Stay: Payer: Medicare Other | Attending: Hematology

## 2022-06-26 DIAGNOSIS — Z170 Estrogen receptor positive status [ER+]: Secondary | ICD-10-CM | POA: Diagnosis not present

## 2022-06-26 DIAGNOSIS — C50911 Malignant neoplasm of unspecified site of right female breast: Secondary | ICD-10-CM | POA: Diagnosis not present

## 2022-06-26 DIAGNOSIS — C182 Malignant neoplasm of ascending colon: Secondary | ICD-10-CM | POA: Diagnosis not present

## 2022-06-26 DIAGNOSIS — Z5112 Encounter for antineoplastic immunotherapy: Secondary | ICD-10-CM | POA: Diagnosis not present

## 2022-06-26 DIAGNOSIS — Z79899 Other long term (current) drug therapy: Secondary | ICD-10-CM | POA: Diagnosis not present

## 2022-06-26 DIAGNOSIS — C189 Malignant neoplasm of colon, unspecified: Secondary | ICD-10-CM

## 2022-06-26 DIAGNOSIS — C773 Secondary and unspecified malignant neoplasm of axilla and upper limb lymph nodes: Secondary | ICD-10-CM | POA: Diagnosis not present

## 2022-06-26 DIAGNOSIS — Z95828 Presence of other vascular implants and grafts: Secondary | ICD-10-CM

## 2022-06-26 LAB — CBC WITH DIFFERENTIAL/PLATELET
Abs Immature Granulocytes: 0.01 10*3/uL (ref 0.00–0.07)
Basophils Absolute: 0.1 10*3/uL (ref 0.0–0.1)
Basophils Relative: 1 %
Eosinophils Absolute: 0.1 10*3/uL (ref 0.0–0.5)
Eosinophils Relative: 2 %
HCT: 36.9 % (ref 36.0–46.0)
Hemoglobin: 11 g/dL — ABNORMAL LOW (ref 12.0–15.0)
Immature Granulocytes: 0 %
Lymphocytes Relative: 18 %
Lymphs Abs: 1 10*3/uL (ref 0.7–4.0)
MCH: 25.9 pg — ABNORMAL LOW (ref 26.0–34.0)
MCHC: 29.8 g/dL — ABNORMAL LOW (ref 30.0–36.0)
MCV: 86.8 fL (ref 80.0–100.0)
Monocytes Absolute: 0.3 10*3/uL (ref 0.1–1.0)
Monocytes Relative: 6 %
Neutro Abs: 4 10*3/uL (ref 1.7–7.7)
Neutrophils Relative %: 73 %
Platelets: 198 10*3/uL (ref 150–400)
RBC: 4.25 MIL/uL (ref 3.87–5.11)
RDW: 21.3 % — ABNORMAL HIGH (ref 11.5–15.5)
WBC: 5.4 10*3/uL (ref 4.0–10.5)
nRBC: 0 % (ref 0.0–0.2)

## 2022-06-26 LAB — COMPREHENSIVE METABOLIC PANEL
ALT: 11 U/L (ref 0–44)
AST: 15 U/L (ref 15–41)
Albumin: 3.6 g/dL (ref 3.5–5.0)
Alkaline Phosphatase: 87 U/L (ref 38–126)
Anion gap: 5 (ref 5–15)
BUN: 20 mg/dL (ref 8–23)
CO2: 24 mmol/L (ref 22–32)
Calcium: 9.6 mg/dL (ref 8.9–10.3)
Chloride: 112 mmol/L — ABNORMAL HIGH (ref 98–111)
Creatinine, Ser: 1.81 mg/dL — ABNORMAL HIGH (ref 0.44–1.00)
GFR, Estimated: 27 mL/min — ABNORMAL LOW (ref >60)
Glucose, Bld: 123 mg/dL — ABNORMAL HIGH (ref 70–99)
Potassium: 3.3 mmol/L — ABNORMAL LOW (ref 3.5–5.1)
Sodium: 141 mmol/L (ref 135–145)
Total Bilirubin: 0.7 mg/dL (ref 0.3–1.2)
Total Protein: 6.8 g/dL (ref 6.5–8.1)

## 2022-06-26 LAB — MAGNESIUM: Magnesium: 1.8 mg/dL (ref 1.7–2.4)

## 2022-06-26 LAB — TSH: TSH: 2.404 u[IU]/mL (ref 0.350–4.500)

## 2022-06-26 MED ORDER — SODIUM CHLORIDE 0.9% FLUSH
10.0000 mL | INTRAVENOUS | Status: DC | PRN
  Administered 2022-06-26: 10 mL

## 2022-06-26 MED ORDER — SODIUM CHLORIDE 0.9 % IV SOLN: Freq: Once | INTRAVENOUS | Status: AC

## 2022-06-26 MED ORDER — SODIUM CHLORIDE 0.9 % IV SOLN
200.0000 mg | Freq: Once | INTRAVENOUS | Status: AC
  Administered 2022-06-26: 200 mg via INTRAVENOUS
  Filled 2022-06-26: qty 8

## 2022-06-26 MED ORDER — HEPARIN SOD (PORK) LOCK FLUSH 100 UNIT/ML IV SOLN
500.0000 [IU] | Freq: Once | INTRAVENOUS | Status: AC | PRN
  Administered 2022-06-26: 500 [IU]

## 2022-06-26 NOTE — Progress Notes (Signed)
Ok to treat with elevated Scr of 1.81 per Dr. Delton Coombes

## 2022-06-26 NOTE — Patient Instructions (Signed)
[facility]  Discharge Instructions: Thank you for choosing [facility] to provide your oncology and hematology care.  If you have a lab appointment with the Cancer Center, please come in thru the Main Entrance and check in at the main information desk.  Wear comfortable clothing and clothing appropriate for easy access to any Portacath or PICC line.   We strive to give you quality time with your provider. You may need to reschedule your appointment if you arrive late (15 or more minutes).  Arriving late affects you and other patients whose appointments are after yours.  Also, if you miss three or more appointments without notifying the office, you may be dismissed from the clinic at the provider's discretion.      For prescription refill requests, have your pharmacy contact our office and allow 72 hours for refills to be completed.    Today you received the following chemotherapy and/or immunotherapy agents Keytruda      To help prevent nausea and vomiting after your treatment, we encourage you to take your nausea medication as directed.  BELOW ARE SYMPTOMS THAT SHOULD BE REPORTED IMMEDIATELY: *FEVER GREATER THAN 100.4 F (38 C) OR HIGHER *CHILLS OR SWEATING *NAUSEA AND VOMITING THAT IS NOT CONTROLLED WITH YOUR NAUSEA MEDICATION *UNUSUAL SHORTNESS OF BREATH *UNUSUAL BRUISING OR BLEEDING *URINARY PROBLEMS (pain or burning when urinating, or frequent urination) *BOWEL PROBLEMS (unusual diarrhea, constipation, pain near the anus) TENDERNESS IN MOUTH AND THROAT WITH OR WITHOUT PRESENCE OF ULCERS (sore throat, sores in mouth, or a toothache) UNUSUAL RASH, SWELLING OR PAIN  UNUSUAL VAGINAL DISCHARGE OR ITCHING   Items with * indicate a potential emergency and should be followed up as soon as possible or go to the Emergency Department if any problems should occur.  Please show the CHEMOTHERAPY ALERT CARD or IMMUNOTHERAPY ALERT CARD at check-in to the  Emergency Department and triage nurse.  Should you have questions after your visit or need to cancel or reschedule your appointment, please contact [facility] [phone]  and follow the prompts.  Office hours are 8:00 a.m. to 4:30 p.m. Monday - Friday. Please note that voicemails left after 4:00 p.m. may not be returned until the following business day.  We are closed weekends and major holidays. You have access to a nurse at all times for urgent questions. Please call the main number to the clinic [phone] and follow the prompts.  For any non-urgent questions, you may also contact your provider using MyChart. We now offer e-Visits for anyone 18 and older to request care online for non-urgent symptoms. For details visit [email or web address].   Also download the MyChart app! Go to the app store, search "MyChart", open the app, select [facility], and log in with your MyChart username and password.  Masks are optional in the cancer centers. If you would like for your care team to wear a mask while they are taking care of you, please let them know. For doctor visits, patients may have with them one support person who is at least 83 years old. At this time, visitors are not allowed in the infusion area.  

## 2022-07-04 ENCOUNTER — Other Ambulatory Visit: Payer: Self-pay

## 2022-07-09 DIAGNOSIS — I447 Left bundle-branch block, unspecified: Secondary | ICD-10-CM | POA: Diagnosis not present

## 2022-07-09 DIAGNOSIS — N3021 Other chronic cystitis with hematuria: Secondary | ICD-10-CM | POA: Diagnosis not present

## 2022-07-09 DIAGNOSIS — C642 Malignant neoplasm of left kidney, except renal pelvis: Secondary | ICD-10-CM | POA: Diagnosis not present

## 2022-07-09 DIAGNOSIS — Z951 Presence of aortocoronary bypass graft: Secondary | ICD-10-CM | POA: Diagnosis not present

## 2022-07-09 DIAGNOSIS — Z85528 Personal history of other malignant neoplasm of kidney: Secondary | ICD-10-CM | POA: Diagnosis not present

## 2022-07-09 DIAGNOSIS — Z87442 Personal history of urinary calculi: Secondary | ICD-10-CM | POA: Diagnosis not present

## 2022-07-09 DIAGNOSIS — Z87891 Personal history of nicotine dependence: Secondary | ICD-10-CM | POA: Diagnosis not present

## 2022-07-09 DIAGNOSIS — Z7902 Long term (current) use of antithrombotics/antiplatelets: Secondary | ICD-10-CM | POA: Diagnosis not present

## 2022-07-09 DIAGNOSIS — E8809 Other disorders of plasma-protein metabolism, not elsewhere classified: Secondary | ICD-10-CM | POA: Diagnosis not present

## 2022-07-09 DIAGNOSIS — N2889 Other specified disorders of kidney and ureter: Secondary | ICD-10-CM | POA: Diagnosis not present

## 2022-07-09 DIAGNOSIS — K219 Gastro-esophageal reflux disease without esophagitis: Secondary | ICD-10-CM | POA: Diagnosis not present

## 2022-07-09 DIAGNOSIS — I129 Hypertensive chronic kidney disease with stage 1 through stage 4 chronic kidney disease, or unspecified chronic kidney disease: Secondary | ICD-10-CM | POA: Diagnosis not present

## 2022-07-09 DIAGNOSIS — N183 Chronic kidney disease, stage 3 unspecified: Secondary | ICD-10-CM | POA: Diagnosis not present

## 2022-07-09 DIAGNOSIS — Z7982 Long term (current) use of aspirin: Secondary | ICD-10-CM | POA: Diagnosis not present

## 2022-07-09 DIAGNOSIS — Z905 Acquired absence of kidney: Secondary | ICD-10-CM | POA: Diagnosis not present

## 2022-07-09 DIAGNOSIS — Z9889 Other specified postprocedural states: Secondary | ICD-10-CM | POA: Diagnosis not present

## 2022-07-09 DIAGNOSIS — N179 Acute kidney failure, unspecified: Secondary | ICD-10-CM | POA: Diagnosis not present

## 2022-07-09 DIAGNOSIS — Z808 Family history of malignant neoplasm of other organs or systems: Secondary | ICD-10-CM | POA: Diagnosis not present

## 2022-07-09 DIAGNOSIS — Z853 Personal history of malignant neoplasm of breast: Secondary | ICD-10-CM | POA: Diagnosis not present

## 2022-07-09 DIAGNOSIS — Z85038 Personal history of other malignant neoplasm of large intestine: Secondary | ICD-10-CM | POA: Diagnosis not present

## 2022-07-09 DIAGNOSIS — Z79811 Long term (current) use of aromatase inhibitors: Secondary | ICD-10-CM | POA: Diagnosis not present

## 2022-07-09 DIAGNOSIS — N189 Chronic kidney disease, unspecified: Secondary | ICD-10-CM | POA: Diagnosis not present

## 2022-07-09 DIAGNOSIS — E039 Hypothyroidism, unspecified: Secondary | ICD-10-CM | POA: Diagnosis not present

## 2022-07-09 DIAGNOSIS — I252 Old myocardial infarction: Secondary | ICD-10-CM | POA: Diagnosis not present

## 2022-07-09 DIAGNOSIS — I2510 Atherosclerotic heart disease of native coronary artery without angina pectoris: Secondary | ICD-10-CM | POA: Diagnosis not present

## 2022-07-09 DIAGNOSIS — I739 Peripheral vascular disease, unspecified: Secondary | ICD-10-CM | POA: Diagnosis not present

## 2022-07-09 DIAGNOSIS — D631 Anemia in chronic kidney disease: Secondary | ICD-10-CM | POA: Diagnosis not present

## 2022-07-15 ENCOUNTER — Other Ambulatory Visit: Payer: Self-pay

## 2022-07-20 ENCOUNTER — Inpatient Hospital Stay: Payer: Medicare Other

## 2022-07-20 ENCOUNTER — Other Ambulatory Visit: Payer: Self-pay | Admitting: Hematology

## 2022-07-20 ENCOUNTER — Inpatient Hospital Stay: Payer: Medicare Other | Admitting: Hematology

## 2022-07-23 DIAGNOSIS — Z87448 Personal history of other diseases of urinary system: Secondary | ICD-10-CM | POA: Diagnosis not present

## 2022-07-23 DIAGNOSIS — C689 Malignant neoplasm of urinary organ, unspecified: Secondary | ICD-10-CM | POA: Diagnosis not present

## 2022-07-23 DIAGNOSIS — Z951 Presence of aortocoronary bypass graft: Secondary | ICD-10-CM | POA: Diagnosis not present

## 2022-07-23 DIAGNOSIS — R7989 Other specified abnormal findings of blood chemistry: Secondary | ICD-10-CM | POA: Diagnosis not present

## 2022-07-23 DIAGNOSIS — Z905 Acquired absence of kidney: Secondary | ICD-10-CM | POA: Diagnosis not present

## 2022-07-23 DIAGNOSIS — I2510 Atherosclerotic heart disease of native coronary artery without angina pectoris: Secondary | ICD-10-CM | POA: Diagnosis not present

## 2022-07-23 DIAGNOSIS — N184 Chronic kidney disease, stage 4 (severe): Secondary | ICD-10-CM | POA: Diagnosis not present

## 2022-07-23 DIAGNOSIS — R310 Gross hematuria: Secondary | ICD-10-CM | POA: Diagnosis not present

## 2022-07-23 DIAGNOSIS — I129 Hypertensive chronic kidney disease with stage 1 through stage 4 chronic kidney disease, or unspecified chronic kidney disease: Secondary | ICD-10-CM | POA: Diagnosis not present

## 2022-07-23 DIAGNOSIS — N189 Chronic kidney disease, unspecified: Secondary | ICD-10-CM | POA: Diagnosis not present

## 2022-07-24 ENCOUNTER — Other Ambulatory Visit: Payer: Self-pay

## 2022-07-25 ENCOUNTER — Other Ambulatory Visit: Payer: Self-pay | Admitting: Hematology

## 2022-07-25 DIAGNOSIS — C182 Malignant neoplasm of ascending colon: Secondary | ICD-10-CM

## 2022-07-28 ENCOUNTER — Encounter: Payer: Self-pay | Admitting: *Deleted

## 2022-07-28 ENCOUNTER — Inpatient Hospital Stay: Payer: Medicare Other | Attending: Hematology | Admitting: Hematology

## 2022-07-28 VITALS — BP 116/78 | HR 81 | Temp 97.5°F | Resp 18 | Ht 63.0 in | Wt 144.2 lb

## 2022-07-28 DIAGNOSIS — C689 Malignant neoplasm of urinary organ, unspecified: Secondary | ICD-10-CM | POA: Diagnosis not present

## 2022-07-28 DIAGNOSIS — D649 Anemia, unspecified: Secondary | ICD-10-CM | POA: Insufficient documentation

## 2022-07-28 DIAGNOSIS — Z905 Acquired absence of kidney: Secondary | ICD-10-CM | POA: Diagnosis not present

## 2022-07-28 DIAGNOSIS — Z8041 Family history of malignant neoplasm of ovary: Secondary | ICD-10-CM | POA: Insufficient documentation

## 2022-07-28 DIAGNOSIS — Z79811 Long term (current) use of aromatase inhibitors: Secondary | ICD-10-CM | POA: Diagnosis not present

## 2022-07-28 DIAGNOSIS — Z808 Family history of malignant neoplasm of other organs or systems: Secondary | ICD-10-CM | POA: Insufficient documentation

## 2022-07-28 DIAGNOSIS — C182 Malignant neoplasm of ascending colon: Secondary | ICD-10-CM

## 2022-07-28 DIAGNOSIS — Z85828 Personal history of other malignant neoplasm of skin: Secondary | ICD-10-CM | POA: Diagnosis not present

## 2022-07-28 DIAGNOSIS — Z87891 Personal history of nicotine dependence: Secondary | ICD-10-CM | POA: Insufficient documentation

## 2022-07-28 DIAGNOSIS — Z85038 Personal history of other malignant neoplasm of large intestine: Secondary | ICD-10-CM | POA: Insufficient documentation

## 2022-07-28 DIAGNOSIS — Z853 Personal history of malignant neoplasm of breast: Secondary | ICD-10-CM | POA: Insufficient documentation

## 2022-07-28 DIAGNOSIS — C652 Malignant neoplasm of left renal pelvis: Secondary | ICD-10-CM | POA: Insufficient documentation

## 2022-07-28 DIAGNOSIS — M810 Age-related osteoporosis without current pathological fracture: Secondary | ICD-10-CM | POA: Insufficient documentation

## 2022-07-28 DIAGNOSIS — I10 Essential (primary) hypertension: Secondary | ICD-10-CM | POA: Diagnosis not present

## 2022-07-28 NOTE — Patient Instructions (Addendum)
[facility]  Discharge Instructions  You were seen and examined today by Dr. Delton Coombes.  Dr. Delton Coombes discussed your most recent visit with [facility] and your most recent labs shows that your kidney function is still elevated. Dr. Delton Coombes is going to get a scan to check on the cancer. If it is not showing anything then we will continue the [facility].  Follow-up as scheduled after PET scan.    Thank you for choosing [facility] to provide your oncology and hematology care.   To afford each patient quality time with our provider, please arrive at least 15 minutes before your scheduled appointment time. You may need to reschedule your appointment if you arrive late (10 or more minutes). Arriving late affects you and other patients whose appointments are after yours.  Also, if you miss three or more appointments without notifying the office, you may be dismissed from the clinic at the provider's discretion.    Again, thank you for choosing [facility].  Our hope is that these requests will decrease the amount of time that you wait before being seen by our physicians.   If you have a lab appointment with the [facility] please come in thru the Main Entrance and check in at the main information desk.           _____________________________________________________________  Should you have questions after your visit to [facility], please contact our office at [phone] and follow the prompts.  Our office hours are 8:00 a.m. to 4:30 p.m. Monday - Thursday and 8:00 a.m. to 2:30 p.m. Friday.  Please note that voicemails left after 4:00 p.m. may not be returned until the following business day.  We are closed weekends and all major holidays.  You do have access to a nurse 24-7, just call the main number to the clinic [phone] and do not press any options, hold on the line and a nurse will answer the phone.     For prescription refill requests, have your pharmacy contact our office and allow 72 hours.    Masks are optional in the cancer centers. If you would like for your care team to wear a mask while they are taking care of you, please let them know. You may have one support person who is at least 83 years old accompany you for your appointments.

## 2022-07-28 NOTE — Progress Notes (Signed)
FMLA paperwork given to daughter Jessica Taylor to return to her employer.

## 2022-07-28 NOTE — Progress Notes (Signed)
[place], [place] [address]   CLINIC:  Medical Oncology/Hematology  PCP:  Antony Contras, MD [address] / [place] [address] [phone]   REASON FOR VISIT:  Follow-up for stage I right breast cancer and colon cancer  PRIOR THERAPY:  1. Robotic proximal colectomy on 10/11/2019. 2. Right breast lumpectomy and SLNB on 04/02/2020  NGS Results: MSI--high, Foundation 1 not sent  CURRENT THERAPY: Keytruda every 3 weeks  BRIEF ONCOLOGIC HISTORY:  Oncology History  Cancer of ascending colon s/p robotic proximal colectomy 10/11/2019  10/11/2019 Initial Diagnosis   Cancer of ascending colon s/p robotic proximal colectomy 10/11/2019   10/23/2019 Cancer Staging   Staging form: Colon and Rectum, AJCC 8th Edition - Clinical stage from 10/23/2019: Stage IVA (cT3, cN1c, cM1a) - Signed by Derek Jack, MD on 12/14/2019   05/14/2020 - 06/26/2022 Chemotherapy   Patient is on Treatment Plan : COLORECTAL Pembrolizumab q21d     05/14/2020 -  Chemotherapy   Patient is on Treatment Plan : COLORECTAL Pembrolizumab (200) q21d     Malignant neoplasm of right female breast ([place])  04/23/2020 Initial Diagnosis   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 [facility])   04/23/2020 Cancer Staging   Staging form: Breast, AJCC 8th Edition - Clinical stage from 04/23/2020: Stage IA (cT1c, cN0(sn), cM0, G2, ER+, PR+, HER2-) - Signed by Derek Jack, MD on 04/23/2020     CANCER STAGING:  Cancer Staging  Cancer of ascending colon s/p robotic proximal colectomy 10/11/2019 Staging form: Colon and Rectum, AJCC 8th Edition - Clinical stage from 10/23/2019: Stage IVA (cT3, cN1c, cM1a) - Signed by Derek Jack, MD on 12/14/2019  Malignant neoplasm of right female breast [facility]) Staging form: Breast, AJCC 8th Edition - Clinical stage from 04/23/2020: Stage IA (cT1c, cN0(sn), cM0, G2, ER+, PR+, HER2-) - Signed by Derek Jack, MD on 04/23/2020   INTERVAL HISTORY:  Jessica Taylor, a 81 y.o. female, seen for follow-up.  She is seen along with her daughter.  She underwent left nephrectomy on 07/13/2022 at [facility].  She is recovering from surgery.  She still has some left-sided abdominal pain at the incision site.  Denies any immunotherapy related side effects.  Mild diarrhea is stable.   REVIEW OF SYSTEMS:  Review of Systems  Gastrointestinal:  Positive for abdominal pain (Left lower quadrant at the incision site) and diarrhea.  All other systems reviewed and are negative.   PAST MEDICAL/SURGICAL HISTORY:  Past Medical History:  Diagnosis Date   Abdominal aortic aneurysm (HCC)    Acid reflux    Anemia    Arthritis    knees , R shoulder - tx /w injection - 11/2014   Basal cell carcinoma (BCC) of dorsum of nose 2010   Resolved   Cancer ([place])    basal cell on nose   Colon cancer (HCC)    Coronary artery disease    Hypercholesteremia    Hypertension    Hypothyroidism    Lt Acute pyelonephritis 09/11/2018   MI, old 2000   Peripheral arterial disease ([employer])    hhigh-grade ostial bilateral calcified iliac stenosis with claudication   Tobacco abuse    Vertigo    when lays on left side.   Past Surgical History:  Procedure Laterality Date   BREAST LUMPECTOMY WITH RADIOACTIVE SEED AND SENTINEL LYMPH NODE BIOPSY Bilateral 04/02/2020   Procedure: BILATERAL BREAST LUMPECTOMY WITH RADIOACTIVE SEED AND RIGHT SENTINEL LYMPH  NODE BIOPSY AND RIGHT TARGETED AXILLARY LYMPH NODE BIOPSY;  Surgeon: Erroll Luna, MD;  Location: [place];  Service: General;  Laterality: Bilateral;   CARDIAC CATHETERIZATION  5 stents   CARDIAC CATHETERIZATION N/A 01/20/2016   Procedure: Left Heart Cath and Coronary Angiography;  Surgeon: Peter M Martinique, MD;  Location: [facility];  Service: Cardiovascular;  Laterality: N/A;   CATARACT EXTRACTION W/PHACO Left 05/24/2014   Procedure: CATARACT  EXTRACTION PHACO AND INTRAOCULAR LENS PLACEMENT (IOC);  Surgeon: Tonny Branch, MD;  Location: [facility];  Service: Ophthalmology;  Laterality: Left;  CDE:  9.30   CATARACT EXTRACTION W/PHACO Right 06/18/2014   Procedure: CATARACT EXTRACTION PHACO AND INTRAOCULAR LENS PLACEMENT RIGHT EYE CDE=10.84;  Surgeon: Tonny Branch, MD;  Location: [facility];  Service: Ophthalmology;  Laterality: Right;   CORONARY ARTERY BYPASS GRAFT N/A 01/24/2016   Procedure: CORONARY ARTERY BYPASS GRAFTING (CABG);  Surgeon: Gaye Pollack, MD;  Location: [facility];  Service: Open Heart Surgery;  Laterality: N/A;   CORONARY STENT PLACEMENT     CORONARY STENT PLACEMENT  03/06/15   CFX DES   CYSTOSCOPY/URETEROSCOPY/HOLMIUM LASER/STENT PLACEMENT Left 09/12/2018   Procedure: CYSTOSCOPY/URETEROSCOPY/STENT PLACEMENT;  Surgeon: Ceasar Mons, MD;  Location: [facility];  Service: Urology;  Laterality: Left;   EYE SURGERY     LEFT HEART CATH AND CORS/GRAFTS ANGIOGRAPHY N/A 09/12/2019   Procedure: LEFT HEART CATH AND CORS/GRAFTS ANGIOGRAPHY;  Surgeon: Troy Sine, MD;  Location: [place];  Service: Cardiovascular;  Laterality: N/A;   LEFT HEART CATHETERIZATION WITH CORONARY ANGIOGRAM N/A 03/06/2015   Procedure: LEFT HEART CATHETERIZATION WITH CORONARY ANGIOGRAM;  Surgeon: Lorretta Harp, MD;  Location: [facility];  Service: Cardiovascular;  Laterality: N/A;   PARTIAL KNEE ARTHROPLASTY Right 02/04/2015   Procedure: UNICOMPARTMENTAL KNEE;  Surgeon: Dorna Leitz, MD;  Location: [facility];  Service: Orthopedics;  Laterality: Right;   PERIPHERAL VASCULAR CATHETERIZATION Bilateral 05/13/2015   Procedure: Lower Extremity Angiography;  Surgeon: Lorretta Harp, MD;  Location: [place] [facility];  Service: Cardiovascular;  Laterality: Bilateral;   PERIPHERAL VASCULAR CATHETERIZATION N/A 05/13/2015   Procedure: Abdominal Aortogram;  Surgeon: Lorretta Harp, MD;  Location: [facility];  Service: Cardiovascular;  Laterality: N/A;    PERIPHERAL VASCULAR CATHETERIZATION Bilateral 06/27/2015   Procedure: Peripheral Vascular Intervention;  Surgeon: Lorretta Harp, MD;  Location: [facility];  Service: Cardiovascular;  Laterality: Bilateral;  ILIACS   PERIPHERAL VASCULAR CATHETERIZATION Bilateral 06/27/2015   Procedure: Peripheral Vascular Atherectomy;  Surgeon: Lorretta Harp, MD;  Location: [place] [facility] [facility];  Service: Cardiovascular;  Laterality: Bilateral;   PORTACATH PLACEMENT Left 05/24/2020   Procedure: INSERTION PORT-A-CATH;  Surgeon: Aviva Signs, MD;  Location: [facility];  Service: General;  Laterality: Left;   TEE WITHOUT CARDIOVERSION N/A 01/24/2016   Procedure: TRANSESOPHAGEAL ECHOCARDIOGRAM (TEE);  Surgeon: Gaye Pollack, MD;  Location: [place];  Service: Open Heart Surgery;  Laterality: N/A;   TONSILLECTOMY     age 76   TUBAL LIGATION      SOCIAL HISTORY:  Social History   Socioeconomic History   Marital status: Married    Spouse name: Tommy   Number of children: 4   Years of education: Not on file   Highest education level: Not on file  Occupational History   Occupation: retired    Comment: worked as Presenter, broadcasting for [employer]  Tobacco Use   Smoking status: Former    Packs/day: 1.50  Years: 42.00    Total pack years: 63.00    Types: Cigarettes    Quit date: 05/19/1999    Years since quitting: 23.2   Smokeless tobacco: Never  Vaping Use   Vaping Use: Never used  Substance and Sexual Activity   Alcohol use: No   Drug use: No   Sexual activity: Yes    Birth control/protection: Surgical  Other Topics Concern   Not on file  Social History Narrative   Not on file   Social Determinants of Health   Financial Resource Strain: Medium Risk (02/18/2021)   Overall Financial Resource Strain (CARDIA)    Difficulty of Paying Living Expenses: Somewhat hard  Food Insecurity: No Food Insecurity (02/18/2021)   Hunger Vital Sign    Worried About Running Out of Food in the Last Year: Never  true    Ran Out of Food in the Last Year: Never true  Transportation Needs: No Transportation Needs (02/18/2021)   PRAPARE - Hydrologist (Medical): No    Lack of Transportation (Non-Medical): No  Physical Activity: Inactive (02/18/2021)   Exercise Vital Sign    Days of Exercise per Week: 0 days    Minutes of Exercise per Session: 0 min  Stress: No Stress Concern Present (02/18/2021)   [place]    Feeling of Stress : Not at all  Social Connections: Moderately Isolated (02/18/2021)   Social Connection and Isolation Panel [NHANES]    Frequency of Communication with Friends and Family: More than three times a week    Frequency of Social Gatherings with Friends and Family: Three times a week    Attends Religious Services: Never    Active Member of Clubs or Organizations: No    Attends Archivist Meetings: Never    Marital Status: Married  Human resources officer Violence: Not At Risk (02/18/2021)   Humiliation, Afraid, Rape, and Kick questionnaire    Fear of Current or Ex-Partner: No    Emotionally Abused: No    Physically Abused: No    Sexually Abused: No    FAMILY HISTORY:  Family History  Problem Relation Age of Onset   Ovarian cancer Mother 75   Cancer Father 64       unsure of which kind, "it was in his glands"   Hypertension Maternal Grandmother    Stroke Maternal Grandfather    Hypertension Son    Brain cancer Sister    Hypertension Daughter    Heart attack Neg Hx    Colon cancer Neg Hx    Esophageal cancer Neg Hx    Inflammatory bowel disease Neg Hx    Liver disease Neg Hx    Pancreatic cancer Neg Hx    Rectal cancer Neg Hx    Stomach cancer Neg Hx     CURRENT MEDICATIONS:  Current Outpatient Medications  Medication Sig Dispense Refill   amLODipine (NORVASC) 5 MG tablet TAKE 1 TABLET BY MOUTH ONCE A DAY. 30 tablet 6   anastrozole (ARIMIDEX) 1 MG tablet TAKE 1 TABLET  BY MOUTH ONCE DAILY. 30 tablet 6   aspirin EC 81 MG tablet Take 81 mg by mouth every evening.      atorvastatin (LIPITOR) 40 MG tablet TAKE (1) TABLET BY MOUTH ONCE DAILY. KEEP FUTURE APPOINTMENTS FOR ANY FURTHER REFILLS. 90 tablet 3   celecoxib (CELEBREX) 100 MG capsule Take 100 mg by mouth 2 (two) times daily.     clopidogrel (  PLAVIX) 75 MG tablet Take 1 tablet (75 mg total) by mouth daily. FILLS. 90 tablet 3   esomeprazole (NEXIUM) 20 MG capsule Take 20 mg by mouth daily.      HYDROcodone-acetaminophen (NORCO/VICODIN) 5-325 MG tablet Take 1 tablet by mouth daily. 15 tablet 0   hydrOXYzine (ATARAX) 25 MG tablet TAKE (1) TABLET BY MOUTH AT BEDTIME AS NEEDED 30 tablet 0   levothyroxine (SYNTHROID) 25 MCG tablet Take 25 mcg by mouth daily.     metoprolol tartrate (LOPRESSOR) 25 MG tablet TAKE 1 TABLET BY MOUTH TWICE DAILY. 160 tablet 3   nitroGLYCERIN (NITROSTAT) 0.4 MG SL tablet Place 1 tablet (0.4 mg total) under the tongue every 5 (five) minutes as needed for chest pain. 25 tablet prn   Pembrolizumab (KEYTRUDA IV) Inject 200 mg into the vein every 21 ( twenty-one) days.      pembrolizumab (KEYTRUDA) 100 MG/4ML SOLN See admin instructions.     tamsulosin (FLOMAX) 0.4 MG CAPS capsule Take 0.4 mg by mouth daily.     Vitamin D, Ergocalciferol, (DRISDOL) 1.25 MG (50000 UNIT) CAPS capsule TAKE 1 CAPSULE BY MOUTH ONCE A WEEK. 4 capsule 0   No current facility-administered medications for this visit.    ALLERGIES:  Allergies  Allergen Reactions   Crab [Shellfish Allergy] Nausea And Vomiting    Throws up violently   Ezetimibe Diarrhea   Keflex [Cephalexin]     Caused sores   Other Nausea And Vomiting    Shrimp    PHYSICAL EXAM:  Performance status (ECOG): 1 - Symptomatic but completely ambulatory  Vitals:   07/28/22 1422  BP: 116/78  Pulse: 81  Resp: 18  Temp: (!) 97.5 F (36.4 C)  SpO2: 98%   Wt Readings from Last 3 Encounters:  07/28/22 144 lb 3.2 oz (65.4 kg)  06/26/22 148 lb  3.2 oz (67.2 kg)  06/04/22 152 lb 8 oz (69.2 kg)   Physical Exam Vitals reviewed.  Constitutional:      Appearance: Normal appearance.  Cardiovascular:     Rate and Rhythm: Normal rate and regular rhythm.     Pulses: Normal pulses.     Heart sounds: Normal heart sounds.  Pulmonary:     Effort: Pulmonary effort is normal.     Breath sounds: Normal breath sounds.  Neurological:     General: No focal deficit present.     Mental Status: She is alert and oriented to person, place, and time.  Psychiatric:        Mood and Affect: Mood normal.        Behavior: Behavior normal.     LABORATORY DATA:  I have reviewed the labs as listed.     Latest Ref Rng & Units 06/26/2022   10:01 AM 06/04/2022   12:08 PM 05/13/2022   12:42 PM  CBC  WBC 4.0 - 10.5 K/uL 5.4  4.9  7.7   Hemoglobin 12.0 - 15.0 g/dL 11.0  9.4  8.9   Hematocrit 36.0 - 46.0 % 36.9  31.7  29.3   Platelets 150 - 400 K/uL 198  233  227       Latest Ref Rng & Units 06/26/2022   10:01 AM 06/04/2022   12:08 PM 05/13/2022   12:42 PM  CMP  Glucose 70 - 99 mg/dL 123  111  110   BUN 8 - 23 mg/dL 20  15  27    Creatinine 0.44 - 1.00 mg/dL 1.81  1.90  2.24   Sodium 135 -  145 mmol/L 141  138  137   Potassium 3.5 - 5.1 mmol/L 3.3  3.6  3.9   Chloride 98 - 111 mmol/L 112  109  111   CO2 22 - 32 mmol/L 24  25  18    Calcium 8.9 - 10.3 mg/dL 9.6  9.1  9.3   Total Protein 6.5 - 8.1 g/dL 6.8  6.4  6.8   Total Bilirubin 0.3 - 1.2 mg/dL 0.7  0.6  0.5   Alkaline Phos 38 - 126 U/L 87  83  92   AST 15 - 41 U/L 15  15  15    ALT 0 - 44 U/L 11  9  12      DIAGNOSTIC IMAGING:  I have independently reviewed the scans and discussed with the patient. No results found.   ASSESSMENT:  1.  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  Right hemicolectomy on 10/11/2019, grade 3 adenocarcinoma, negative margins, positive LVSI, 2 tumor deposits, 0/15 lymph nodes  positive, PT3PN1C. 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  MLH1 hyper methylation present. -PET scan in December 2020 showed lymph node in the right axillary region.  Biopsy consistent with metastatic colon cancer. -Last CEA was 10.7 on 11/22/2019. -Right axillary lymph node excision on 04/02/2020 consistent with metastatic carcinoma from colon cancer. -Pembrolizumab started on 05/14/2020. -PET scan on 08/05/2020 shows complete resolution of lymphadenopathy.  No evidence of new areas of uptake.  Mild vague residual hypermetabolism in the right axilla without soft tissue mass. -PET scan on 12/23/2020 with no evidence of recurrence.   2.  Stage I (PT1CPN0) right Breast, Grade 2 Invasive Lobular Carcinoma: -MRI of the breast on 12/25/2019 showed 1 cm mass behind the right areola with a suspicious mass in the left breast. -Right breast lumpectomy on 04/02/2020 shows invasive lobular carcinoma, grade 2, 1.2 cm.  Resection margins are negative.  Negative LVSI.  2 sentinel lymph nodes were negative for carcinoma.  ER/PR 100% positive, HER-2 negative, Ki-67 10%.   PLAN:  1.  Stage IV colon cancer to the right axillary lymph node: - CTAP on 05/28/2022, noncontrast study did not show any evidence of metastatic disease. - Last Keytruda on 06/26/2022.  She is recuperating from surgery. - She does not report any immunotherapy related side effects.  We will likely start her back after the PET scan.   2.  Right breast invasive lobular carcinoma, grade 2: - Continue anastrozole daily. - Mammogram on 03/31/2022 was BI-RADS Category 1.   3.  Osteoporosis: - Continue vitamin D 50,000 units weekly.   4.  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 - Based on her renal function, she is not a candidate for cisplatin based chemotherapy.  The next best option is nivolumab. - Recommend PET CT scan for restaging.  RTC after scan to discuss further plan.   5.  Normocytic anemia: - Last ferritin was 10 on 06/04/2022.  Status post Feraheme x2 on 06/16/2022.  Hemoglobin improved to 11.   Orders placed this encounter:  Orders Placed This Encounter  Procedures   NM PET Image Restag (PS) Skull Base  To Thigh      Derek Jack, [place] [phone]

## 2022-07-29 ENCOUNTER — Other Ambulatory Visit: Payer: Self-pay

## 2022-07-30 ENCOUNTER — Other Ambulatory Visit: Payer: Self-pay | Admitting: Hematology

## 2022-08-06 ENCOUNTER — Other Ambulatory Visit: Payer: Medicare Other

## 2022-08-06 ENCOUNTER — Ambulatory Visit: Payer: Medicare Other

## 2022-08-06 ENCOUNTER — Ambulatory Visit: Payer: Medicare Other | Admitting: Physician Assistant

## 2022-08-06 ENCOUNTER — Ambulatory Visit (HOSPITAL_COMMUNITY)
Admission: RE | Admit: 2022-08-06 | Discharge: 2022-08-06 | Disposition: A | Discharge status: Home / Self Care | Payer: Medicare Other | Source: Ambulatory Visit | Attending: Hematology | Admitting: Hematology

## 2022-08-06 DIAGNOSIS — C689 Malignant neoplasm of urinary organ, unspecified: Secondary | ICD-10-CM | POA: Insufficient documentation

## 2022-08-06 DIAGNOSIS — Z853 Personal history of malignant neoplasm of breast: Secondary | ICD-10-CM | POA: Diagnosis not present

## 2022-08-06 MED ORDER — FLUDEOXYGLUCOSE F - 18 (FDG) INJECTION
7.7700 | Freq: Once | INTRAVENOUS | Status: AC | PRN
  Administered 2022-08-06: 7.77 via INTRAVENOUS

## 2022-08-10 ENCOUNTER — Other Ambulatory Visit: Payer: Self-pay

## 2022-08-18 ENCOUNTER — Inpatient Hospital Stay (HOSPITAL_BASED_OUTPATIENT_CLINIC_OR_DEPARTMENT_OTHER): Payer: Medicare Other | Admitting: Hematology

## 2022-08-18 ENCOUNTER — Inpatient Hospital Stay: Payer: Medicare Other

## 2022-08-18 DIAGNOSIS — C189 Malignant neoplasm of colon, unspecified: Secondary | ICD-10-CM

## 2022-08-18 DIAGNOSIS — Z85038 Personal history of other malignant neoplasm of large intestine: Secondary | ICD-10-CM | POA: Diagnosis not present

## 2022-08-18 DIAGNOSIS — D649 Anemia, unspecified: Secondary | ICD-10-CM | POA: Diagnosis not present

## 2022-08-18 DIAGNOSIS — Z87891 Personal history of nicotine dependence: Secondary | ICD-10-CM | POA: Diagnosis not present

## 2022-08-18 DIAGNOSIS — Z95828 Presence of other vascular implants and grafts: Secondary | ICD-10-CM

## 2022-08-18 DIAGNOSIS — I10 Essential (primary) hypertension: Secondary | ICD-10-CM | POA: Diagnosis not present

## 2022-08-18 DIAGNOSIS — C182 Malignant neoplasm of ascending colon: Secondary | ICD-10-CM

## 2022-08-18 DIAGNOSIS — C642 Malignant neoplasm of left kidney, except renal pelvis: Secondary | ICD-10-CM | POA: Diagnosis not present

## 2022-08-18 DIAGNOSIS — M810 Age-related osteoporosis without current pathological fracture: Secondary | ICD-10-CM | POA: Diagnosis not present

## 2022-08-18 DIAGNOSIS — Z905 Acquired absence of kidney: Secondary | ICD-10-CM | POA: Diagnosis not present

## 2022-08-18 DIAGNOSIS — Z853 Personal history of malignant neoplasm of breast: Secondary | ICD-10-CM | POA: Diagnosis not present

## 2022-08-18 DIAGNOSIS — Z79811 Long term (current) use of aromatase inhibitors: Secondary | ICD-10-CM | POA: Diagnosis not present

## 2022-08-18 DIAGNOSIS — Z85828 Personal history of other malignant neoplasm of skin: Secondary | ICD-10-CM | POA: Diagnosis not present

## 2022-08-18 DIAGNOSIS — C50911 Malignant neoplasm of unspecified site of right female breast: Secondary | ICD-10-CM

## 2022-08-18 DIAGNOSIS — Z808 Family history of malignant neoplasm of other organs or systems: Secondary | ICD-10-CM | POA: Diagnosis not present

## 2022-08-18 DIAGNOSIS — C652 Malignant neoplasm of left renal pelvis: Secondary | ICD-10-CM | POA: Diagnosis not present

## 2022-08-18 LAB — COMPREHENSIVE METABOLIC PANEL
ALT: 14 U/L (ref 0–44)
AST: 19 U/L (ref 15–41)
Albumin: 3.8 g/dL (ref 3.5–5.0)
Alkaline Phosphatase: 92 U/L (ref 38–126)
Anion gap: 10 (ref 5–15)
BUN: 26 mg/dL — ABNORMAL HIGH (ref 8–23)
CO2: 22 mmol/L (ref 22–32)
Calcium: 9.6 mg/dL (ref 8.9–10.3)
Chloride: 108 mmol/L (ref 98–111)
Creatinine, Ser: 2.45 mg/dL — ABNORMAL HIGH (ref 0.44–1.00)
GFR, Estimated: 19 mL/min — ABNORMAL LOW (ref >60)
Glucose, Bld: 111 mg/dL — ABNORMAL HIGH (ref 70–99)
Potassium: 3.5 mmol/L (ref 3.5–5.1)
Sodium: 140 mmol/L (ref 135–145)
Total Bilirubin: 0.4 mg/dL (ref 0.3–1.2)
Total Protein: 6.6 g/dL (ref 6.5–8.1)

## 2022-08-18 LAB — CBC WITH DIFFERENTIAL/PLATELET
Abs Immature Granulocytes: 0.02 10*3/uL (ref 0.00–0.07)
Basophils Absolute: 0.1 10*3/uL (ref 0.0–0.1)
Basophils Relative: 2 %
Eosinophils Absolute: 0.1 10*3/uL (ref 0.0–0.5)
Eosinophils Relative: 2 %
HCT: 40 % (ref 36.0–46.0)
Hemoglobin: 12.8 g/dL (ref 12.0–15.0)
Immature Granulocytes: 0 %
Lymphocytes Relative: 26 %
Lymphs Abs: 1.4 10*3/uL (ref 0.7–4.0)
MCH: 29 pg (ref 26.0–34.0)
MCHC: 32 g/dL (ref 30.0–36.0)
MCV: 90.5 fL (ref 80.0–100.0)
Monocytes Absolute: 0.4 10*3/uL (ref 0.1–1.0)
Monocytes Relative: 7 %
Neutro Abs: 3.3 10*3/uL (ref 1.7–7.7)
Neutrophils Relative %: 63 %
Platelets: 152 10*3/uL (ref 150–400)
RBC: 4.42 MIL/uL (ref 3.87–5.11)
RDW: 18.6 % — ABNORMAL HIGH (ref 11.5–15.5)
WBC: 5.2 10*3/uL (ref 4.0–10.5)
nRBC: 0 % (ref 0.0–0.2)

## 2022-08-18 LAB — TSH: TSH: 3.888 u[IU]/mL (ref 0.350–4.500)

## 2022-08-18 MED ORDER — HEPARIN SOD (PORK) LOCK FLUSH 100 UNIT/ML IV SOLN
500.0000 [IU] | Freq: Once | INTRAVENOUS | Status: AC
  Administered 2022-08-18: 500 [IU] via INTRAVENOUS

## 2022-08-18 MED ORDER — SODIUM CHLORIDE 0.9% FLUSH
10.0000 mL | Freq: Once | INTRAVENOUS | Status: AC
  Administered 2022-08-18: 10 mL

## 2022-08-18 MED ORDER — SODIUM CHLORIDE 0.9 % IV SOLN: Freq: Once | INTRAVENOUS | Status: AC

## 2022-08-18 NOTE — Progress Notes (Signed)
OFF PATHWAY REGIMEN - Colorectal  No Change  Continue With Treatment as Ordered.  Original Decision Date/Time: 05/06/2020 18:45   [id number]:Pembrolizumab 200 mg q21 Days:   A cycle is 21 days:     Pembrolizumab   **Always confirm dose/schedule in your pharmacy ordering system**  Patient Characteristics: Distant Metastases, Nonsurgical Candidate, BRAF V600 Positive and KRAS/NRAS Mutation Positive/Unknown Tumor Location: Colon Therapeutic Status: Distant Metastases Microsatellite/Mismatch Repair Status: MSI-H/dMMR BRAF Mutation Status: Mutation Positive KRAS/NRAS Mutation Status: Awaiting Test Results Intent of Therapy: Non-Curative / Palliative Intent, Discussed with Patient

## 2022-08-18 NOTE — Progress Notes (Signed)
Patient presents today for treatment and follow up visit with Dr. Ludwig Lean. Vital signs within parameters for treatment. Creatinine 2.45 today.   Message received from Sylvester Harder RN / Dr. Delton Coombes. NO treatment today. Infuse 500 mls of Normal Saline over an 1 hr prior to discharge.   500 ml bolus of normal saline given today per MD orders. Tolerated infusion without adverse affects. Vital signs stable. No complaints at this time. Discharged from clinic ambulatory in stable condition. Alert and oriented x 3. F/U with [facility] as scheduled.

## 2022-08-18 NOTE — Progress Notes (Signed)
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 Robotic proximal colectomy on 10/11/2019. 2. Right breast lumpectomy and SLNB on 04/02/2020 3.  Left radical nephro ureterectomy, lymphadenectomy, bladder cuff excision on 07/13/2022  NGS Results: MSI--high, Foundation 1 not sent  CURRENT THERAPY: Opdivo every 4 weeks  BRIEF ONCOLOGIC HISTORY:  Oncology History  Cancer of ascending colon s/p robotic proximal colectomy 10/11/2019  10/11/2019 Initial Diagnosis   Cancer of ascending colon s/p robotic proximal colectomy 10/11/2019   10/23/2019 Cancer Staging   Staging form: Colon and Rectum, AJCC 8th Edition - Clinical stage from 10/23/2019: Stage IVA (cT3, cN1c, cM1a) - Signed by Derek Jack, MD on 12/14/2019   05/14/2020 - 06/26/2022 Chemotherapy   Patient is on Treatment Plan : COLORECTAL Pembrolizumab q21d     05/14/2020 -  Chemotherapy   Patient is on Treatment Plan : COLORECTAL Pembrolizumab (200) q21d     Malignant neoplasm of right female breast ([place])  04/23/2020 Initial Diagnosis   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 ([facility] [facility])   04/23/2020 Cancer Staging   Staging form: Breast, AJCC 8th Edition - Clinical stage from 04/23/2020: Stage IA (cT1c, cN0(sn), cM0, G2, ER+, PR+, HER2-) - Signed by Derek Jack, MD on 04/23/2020     CANCER STAGING:  Cancer Staging  Cancer of ascending colon s/p robotic proximal colectomy 10/11/2019 Staging form: Colon and Rectum, AJCC 8th Edition - Clinical stage from 10/23/2019: Stage IVA (cT3, cN1c, cM1a) - Signed by Derek Jack, MD on 12/14/2019  Malignant neoplasm of right female breast [facility]) Staging form: Breast, AJCC  8th Edition - Clinical stage from 04/23/2020: Stage IA (cT1c, cN0(sn), cM0, G2, ER+, PR+, HER2-) - Signed by Derek Jack, MD on 04/23/2020   INTERVAL HISTORY:  Jessica Taylor, a 81 y.o. 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  She underwent left nephrectomy on 07/13/2022.  Energy levels are still low and reported at 50%.  Chronic on and off diarrhea is stable.  Appetite is also at 50%.  Weight has been stable.  Denies any abdominal pains.  She reported pain in the tailbone region after a fall after surgery.   REVIEW OF SYSTEMS:  Review of Systems  Gastrointestinal:  Positive for diarrhea.  All other systems reviewed and are negative.   PAST MEDICAL/SURGICAL HISTORY:  Past Medical History:  Diagnosis Date   Abdominal aortic aneurysm (HCC)    Acid reflux    Anemia    Arthritis    knees , R shoulder - tx /w injection - 11/2014   Basal cell carcinoma (BCC) of dorsum of nose 2010   Resolved   Cancer ([place])    basal cell on nose   Colon cancer (HCC)    Coronary artery disease    Hypercholesteremia    Hypertension    Hypothyroidism    Lt Acute pyelonephritis 09/11/2018   MI, old 2000   Peripheral arterial disease ([place])    hhigh-grade ostial bilateral calcified iliac stenosis with claudication   Tobacco abuse    Vertigo    when lays on left side.   Past Surgical History:  Procedure Laterality Date   BREAST LUMPECTOMY WITH RADIOACTIVE  SEED AND SENTINEL LYMPH NODE BIOPSY Bilateral 04/02/2020   Procedure: BILATERAL BREAST LUMPECTOMY WITH RADIOACTIVE SEED AND RIGHT SENTINEL LYMPH NODE BIOPSY AND RIGHT TARGETED AXILLARY LYMPH NODE BIOPSY;  Surgeon: Erroll Luna, MD;  Location: [facility];  Service: General;  Laterality: Bilateral;   CARDIAC CATHETERIZATION  5 stents   CARDIAC CATHETERIZATION N/A 01/20/2016   Procedure: Left Heart Cath and Coronary Angiography;  Surgeon: Peter M Martinique, MD;  Location: [facility]  CV [facility];  Service: Cardiovascular;  Laterality: N/A;   CATARACT EXTRACTION W/PHACO Left 05/24/2014   Procedure: CATARACT EXTRACTION PHACO AND INTRAOCULAR LENS PLACEMENT (IOC);  Surgeon: Tonny Branch, MD;  Location: [facility];  Service: Ophthalmology;  Laterality: Left;  CDE:  9.30   CATARACT EXTRACTION W/PHACO Right 06/18/2014   Procedure: CATARACT EXTRACTION PHACO AND INTRAOCULAR LENS PLACEMENT RIGHT EYE CDE=10.84;  Surgeon: Tonny Branch, MD;  Location: [facility];  Service: Ophthalmology;  Laterality: Right;   CORONARY ARTERY BYPASS GRAFT N/A 01/24/2016   Procedure: CORONARY ARTERY BYPASS GRAFTING (CABG);  Surgeon: Gaye Pollack, MD;  Location: [facility];  Service: Open Heart Surgery;  Laterality: N/A;   CORONARY STENT PLACEMENT     CORONARY STENT PLACEMENT  03/06/15   CFX DES   CYSTOSCOPY/URETEROSCOPY/HOLMIUM LASER/STENT PLACEMENT Left 09/12/2018   Procedure: CYSTOSCOPY/URETEROSCOPY/STENT PLACEMENT;  Surgeon: Ceasar Mons, MD;  Location: [facility];  Service: Urology;  Laterality: Left;   EYE SURGERY     LEFT HEART CATH AND CORS/GRAFTS ANGIOGRAPHY N/A 09/12/2019   Procedure: LEFT HEART CATH AND CORS/GRAFTS ANGIOGRAPHY;  Surgeon: Troy Sine, MD;  Location: [facility];  Service: Cardiovascular;  Laterality: N/A;   LEFT HEART CATHETERIZATION WITH CORONARY ANGIOGRAM N/A 03/06/2015   Procedure: LEFT HEART CATHETERIZATION WITH CORONARY ANGIOGRAM;  Surgeon: Lorretta Harp, MD;  Location: [facility] CATH LAB;  Service: Cardiovascular;  Laterality: N/A;   PARTIAL KNEE ARTHROPLASTY Right 02/04/2015   Procedure: UNICOMPARTMENTAL KNEE;  Surgeon: Dorna Leitz, MD;  Location: [facility];  Service: Orthopedics;  Laterality: Right;   PERIPHERAL VASCULAR CATHETERIZATION Bilateral 05/13/2015   Procedure: Lower Extremity Angiography;  Surgeon: Lorretta Harp, MD;  Location: [facility];  Service: Cardiovascular;  Laterality: Bilateral;   PERIPHERAL VASCULAR CATHETERIZATION N/A 05/13/2015   Procedure: Abdominal  Aortogram;  Surgeon: Lorretta Harp, MD;  Location: [facility];  Service: Cardiovascular;  Laterality: N/A;   PERIPHERAL VASCULAR CATHETERIZATION Bilateral 06/27/2015   Procedure: Peripheral Vascular Intervention;  Surgeon: Lorretta Harp, MD;  Location: [facility];  Service: Cardiovascular;  Laterality: Bilateral;  ILIACS   PERIPHERAL VASCULAR CATHETERIZATION Bilateral 06/27/2015   Procedure: Peripheral Vascular Atherectomy;  Surgeon: Lorretta Harp, MD;  Location: [facility];  Service: Cardiovascular;  Laterality: Bilateral;   PORTACATH PLACEMENT Left 05/24/2020   Procedure: INSERTION PORT-A-CATH;  Surgeon: Aviva Signs, MD;  Location: [facility];  Service: General;  Laterality: Left;   TEE WITHOUT CARDIOVERSION N/A 01/24/2016   Procedure: TRANSESOPHAGEAL ECHOCARDIOGRAM (TEE);  Surgeon: Gaye Pollack, MD;  Location: [facility];  Service: Open Heart Surgery;  Laterality: N/A;   TONSILLECTOMY     age 67   TUBAL LIGATION      SOCIAL HISTORY:  Social History   Socioeconomic History   Marital status: Married    Spouse name: Tommy   Number of children: 4   Years of education: Not on file   Highest education level: Not on file  Occupational History   Occupation: retired    Comment:  worked as Presenter, broadcasting for [employer]  Tobacco Use   Smoking status: Former    Packs/day: 1.50    Years: 42.00    Total pack years: 63.00    Types: Cigarettes    Quit date: 05/19/1999    Years since quitting: 23.2   Smokeless tobacco: Never  Vaping Use   Vaping Use: Never used  Substance and Sexual Activity   Alcohol use: No   Drug use: No   Sexual activity: Yes    Birth control/protection: Surgical  Other Topics Concern   Not on file  Social History Narrative   Not on file   Social Determinants of Health   Financial Resource Strain: Medium Risk (02/18/2021)   Overall Financial Resource Strain (CARDIA)    Difficulty of Paying Living Expenses: Somewhat hard  Food  Insecurity: No Food Insecurity (02/18/2021)   Hunger Vital Sign    Worried About Running Out of Food in the Last Year: Never true    [place] in the Last Year: Never true  Transportation Needs: No Transportation Needs (02/18/2021)   PRAPARE - Hydrologist (Medical): No    Lack of Transportation (Non-Medical): No  Physical Activity: Inactive (02/18/2021)   Exercise Vital Sign    Days of Exercise per Week: 0 days    Minutes of Exercise per Session: 0 min  Stress: No Stress Concern Present (02/18/2021)   [place]    Feeling of Stress : Not at all  Social Connections: Moderately Isolated (02/18/2021)   Social Connection and Isolation Panel [NHANES]    Frequency of Communication with Friends and Family: More than three times a week    Frequency of Social Gatherings with Friends and Family: Three times a week    Attends Religious Services: Never    Active Member of Clubs or Organizations: No    Attends Archivist Meetings: Never    Marital Status: Married  Human resources officer Violence: Not At Risk (02/18/2021)   Humiliation, Afraid, Rape, and Kick questionnaire    Fear of Current or Ex-Partner: No    Emotionally Abused: No    Physically Abused: No    Sexually Abused: No    FAMILY HISTORY:  Family History  Problem Relation Age of Onset   Ovarian cancer Mother 30   Cancer Father 68       unsure of which kind, "it was in his glands"   Hypertension Maternal Grandmother    Stroke Maternal Grandfather    Hypertension Son    Brain cancer Sister    Hypertension Daughter    Heart attack Neg Hx    Colon cancer Neg Hx    Esophageal cancer Neg Hx    Inflammatory bowel disease Neg Hx    Liver disease Neg Hx    Pancreatic cancer Neg Hx    Rectal cancer Neg Hx    Stomach cancer Neg Hx     CURRENT MEDICATIONS:  Current Outpatient Medications  Medication Sig Dispense Refill    amLODipine (NORVASC) 5 MG tablet TAKE 1 TABLET BY MOUTH ONCE A DAY. 30 tablet 0   anastrozole (ARIMIDEX) 1 MG tablet TAKE 1 TABLET BY MOUTH ONCE DAILY. 30 tablet 6   aspirin EC 81 MG tablet Take 81 mg by mouth every evening.      atorvastatin (LIPITOR) 40 MG tablet TAKE (1) TABLET BY MOUTH ONCE DAILY. KEEP FUTURE APPOINTMENTS FOR ANY FURTHER REFILLS. [place]  tablet 3   celecoxib (CELEBREX) 100 MG capsule Take 100 mg by mouth 2 (two) times daily.     clopidogrel (PLAVIX) 75 MG tablet Take 1 tablet (75 mg total) by mouth daily. FILLS. 90 tablet 3   esomeprazole (NEXIUM) 20 MG capsule Take 20 mg by mouth daily.      HYDROcodone-acetaminophen (NORCO/VICODIN) 5-325 MG tablet Take 1 tablet by mouth daily. 15 tablet 0   hydrOXYzine (ATARAX) 25 MG tablet TAKE (1) TABLET BY MOUTH AT BEDTIME AS NEEDED 30 tablet 0   levothyroxine (SYNTHROID) 25 MCG tablet Take 25 mcg by mouth daily.     metoprolol tartrate (LOPRESSOR) 25 MG tablet TAKE 1 TABLET BY MOUTH TWICE DAILY. 160 tablet 3   nitroGLYCERIN (NITROSTAT) 0.4 MG SL tablet Place 1 tablet (0.4 mg total) under the tongue every 5 (five) minutes as needed for chest pain. 25 tablet prn   Pembrolizumab (KEYTRUDA IV) Inject 200 mg into the vein every 21 ( twenty-one) days.      pembrolizumab (KEYTRUDA) 100 MG/4ML SOLN See admin instructions.     tamsulosin (FLOMAX) 0.4 MG CAPS capsule Take 0.4 mg by mouth daily.     Vitamin D, Ergocalciferol, (DRISDOL) 1.25 MG (50000 UNIT) CAPS capsule TAKE 1 CAPSULE BY MOUTH ONCE A WEEK. 4 capsule 0   No current facility-administered medications for this visit.    ALLERGIES:  Allergies  Allergen Reactions   Crab [Shellfish Allergy] Nausea And Vomiting    Throws up violently   Ezetimibe Diarrhea   Keflex [Cephalexin]     Caused sores   Other Nausea And Vomiting    Shrimp    PHYSICAL EXAM:  Performance status (ECOG): 1 - Symptomatic but completely ambulatory  There were no vitals filed for this visit.  Wt Readings from  Last 3 Encounters:  08/18/22 144 lb 3.2 oz (65.4 kg)  07/28/22 144 lb 3.2 oz (65.4 kg)  06/26/22 148 lb 3.2 oz (67.2 kg)   Physical Exam Vitals reviewed.  Constitutional:      Appearance: Normal appearance.  Cardiovascular:     Rate and Rhythm: Normal rate and regular rhythm.     Pulses: Normal pulses.     Heart sounds: Normal heart sounds.  Pulmonary:     Effort: Pulmonary effort is normal.     Breath sounds: Normal breath sounds.  Neurological:     General: No focal deficit present.     Mental Status: She is alert and oriented to person, place, and time.  Psychiatric:        Mood and Affect: Mood normal.        Behavior: Behavior normal.    LABORATORY DATA:  I have reviewed the labs as listed.     Latest Ref Rng & Units 06/26/2022   10:01 AM 06/04/2022   12:08 PM 05/13/2022   12:42 PM  CBC  WBC 4.0 - 10.5 K/uL 5.4  4.9  7.7   Hemoglobin 12.0 - 15.0 g/dL 11.0  9.4  8.9   Hematocrit 36.0 - 46.0 % 36.9  31.7  29.3   Platelets 150 - 400 K/uL 198  233  227       Latest Ref Rng & Units 06/26/2022   10:01 AM 06/04/2022   12:08 PM 05/13/2022   12:42 PM  CMP  Glucose 70 - 99 mg/dL 123  111  110   BUN 8 - 23 mg/dL _0 Creatinine 0.44 - 1.00 mg/dL 1.81  1.90  2.24   Sodium 135 - 145 mmol/L 141  138  137   Potassium 3.5 - 5.1 mmol/L 3.3  3.6  3.9   Chloride 98 - 111 mmol/L 112  109  111   CO2 22 - 32 mmol/L _0 Calcium 8.9 - 10.3 mg/dL 9.6  9.1  9.3   Total Protein 6.5 - 8.1 g/dL 6.8  6.4  6.8   Total Bilirubin 0.3 - 1.2 mg/dL 0.7  0.6  0.5   Alkaline Phos 38 - 126 U/L 87  83  92   AST 15 - 41 U/L _1 ALT 0 - 44 U/L _2 DIAGNOSTIC IMAGING:  I have independently reviewed the scans and discussed with the patient. NM PET Image Restag (PS) Skull Base To Thigh  Result Date: 08/08/2022 CLINICAL DATA:  Subsequent treatment strategy for bladder cancer. Patient also has a history left breast cancer and renal cell carcinoma. Status post left  nephrectomy. * Tracking Code: BO * EXAM: NUCLEAR MEDICINE PET SKULL BASE TO THIGH TECHNIQUE: 7.77 mCi F-18 FDG was injected intravenously. Full-ring PET imaging was performed from the skull base to thigh after the radiotracer. CT data was obtained and used for attenuation correction and anatomic localization. Fasting blood glucose: 108 mg/dl COMPARISON:  CT scan 05/28/2022 and prior PET-CT 12/23/2020 FINDINGS: Mediastinal blood pool activity: SUV max 2.66 Liver activity: SUV max NA NECK: No hypermetabolic lymph nodes in the neck. Incidental CT findings: Bilateral carotid artery calcifications. CHEST: No hypermetabolic mediastinal or hilar nodes. No suspicious pulmonary nodules on the CT scan. Surgical changes involving the right breast but no hypermetabolic breast lesions, supraclavicular or axillary adenopathy. Incidental CT findings: Chronic lung changes. Stable advanced vascular disease and surgical changes from coronary artery bypass surgery. ABDOMEN/PELVIS: No abnormal hypermetabolic activity within the liver, pancreas, adrenal glands, or spleen. No hypermetabolic lymph nodes in the abdomen or pelvis. Incidental CT findings: Surgical changes from a left nephrectomy. Stable colonic diverticulosis. No obvious bladder lesions on the CT scan. Stable severe vascular disease with stable 3.3 cm infrarenal abdominal aortic aneurysm and distal chronic dissection. Recommend follow-up ultrasound every 3 years. This recommendation follows ACR consensus guidelines: White Paper of the ACR Incidental Findings Committee II on Vascular Findings. J Am Coll Radiol 2013; 10:789-794. Remote surgical changes involving the bowel. Gas noted in the anterior abdominal wall subcutaneous fat likely related to injections. SKELETON: No findings to suggest osseous metastatic disease. Incidental CT findings: None. IMPRESSION: 1. No PET-CT findings to suggest metastatic disease. 2. Stable chronic changes as detailed above. 3. Stable advanced  vascular disease. 4. Stable infrarenal abdominal aortic aneurysm. Electronically Signed   By: Marijo Sanes M.D.   On: 08/08/2022 09:44     ASSESSMENT:  1.  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  Right hemicolectomy on 10/11/2019, grade 3 adenocarcinoma, negative margins, positive LVSI, 2 tumor deposits, 0/15 lymph nodes positive, PT3PN1C. 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  MLH1 hyper methylation present. -PET scan in December 2020 showed lymph node in the right axillary region.  Biopsy consistent with metastatic colon cancer. -Last CEA was 10.7 on 11/22/2019. -Right axillary lymph node excision on 04/02/2020 consistent with metastatic carcinoma from colon cancer. -Pembrolizumab started on 05/14/2020. -PET scan on 08/05/2020 shows complete resolution of lymphadenopathy.  No evidence of new areas of  uptake.  Mild vague residual hypermetabolism in the right axilla without soft tissue mass. -PET scan on 12/23/2020 with no evidence of recurrence.   2.  Stage I (PT1CPN0) right Breast, Grade 2 Invasive Lobular Carcinoma: -MRI of the breast on 12/25/2019 showed 1 cm mass behind the right areola with a suspicious mass in the left breast. -Right breast lumpectomy on 04/02/2020 shows invasive lobular carcinoma, grade 2, 1.2 cm.  Resection margins are negative.  Negative LVSI.  2 sentinel lymph nodes were negative for carcinoma.  ER/PR 100% positive, HER-2 negative, Ki-67 10%.  3.  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 - Based on her renal function, she is not a candidate for cisplatin based chemotherapy.  Nivolumab was recommended for 1 year.   PLAN:  1.  Stage IV colon cancer to the right axillary lymph node: -Last Keytruda was on 06/26/2022. - Reviewed the PET scan from 08/06/2022 which did not show any evidence of metastatic disease.  CEA was 9.0 on 06/04/2022. - Labs today shows creatinine 2.45.  She will receive IV hydration today with final mL normal saline over 1 hour.  I have recommended her to increase oral fluid intake. - She reported pain in the tailbone region since she sustained a fall after surgery.  Examination today did not reveal any lesions.  PET scan also did not reveal any lesions in this area. - As Beryle Flock is not approved in the adjuvant setting for high-grade urothelial carcinoma, I have recommended discontinuing Keytruda and starting opdivo.  Opdivo is approved both for metastatic colon cancer as well as adjuvant therapy for high-grade urothelial carcinoma.   2.  Right breast invasive lobular carcinoma, grade 2: -Mammogram on 03/31/2022 was BI-RADS Category 1. -Continue anastrozole daily.   3.  Osteoporosis: -Continue vitamin D 50,000 units weekly.   4.  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 - As she is not a candidate for cisplatin based chemotherapy due to renal function, I have recommended 1 year of nivolumab in the adjuvant setting. - We discussed side effects in detail including immunotherapy related side effects. - We will start her first treatment next week.  I plan to see her back prior to second treatment.   5.  Normocytic anemia: -She is status post Feraheme x2.  Hemoglobin has improved to 12.8.   Orders placed this encounter:  No orders of the defined types were placed in this encounter.     Derek Jack, MD [place] [phone]

## 2022-08-18 NOTE — Patient Instructions (Signed)
[facility]  Discharge Instructions: Thank you for choosing [facility] to provide your oncology and hematology care.  If you have a lab appointment with the [facility], please come in thru the Main Entrance and check in at the main information desk.  Wear comfortable clothing and clothing appropriate for easy access to any Portacath or PICC line.   We strive to give you quality time with your provider. You may need to reschedule your appointment if you arrive late (15 or more minutes).  Arriving late affects you and other patients whose appointments are after yours.  Also, if you miss three or more appointments without notifying the office, you may be dismissed from the clinic at the provider's discretion.      For prescription refill requests, have your pharmacy contact our office and allow 72 hours for refills to be completed.    Today you received the following chemotherapy and/or immunotherapy agents 500 ml bolus of Normal Saline over an hour.       To help prevent nausea and vomiting after your treatment, we encourage you to take your nausea medication as directed.  BELOW ARE SYMPTOMS THAT SHOULD BE REPORTED IMMEDIATELY: *FEVER GREATER THAN 100.4 F (38 C) OR HIGHER *CHILLS OR SWEATING *NAUSEA AND VOMITING THAT IS NOT CONTROLLED WITH YOUR NAUSEA MEDICATION *UNUSUAL SHORTNESS OF BREATH *UNUSUAL BRUISING OR BLEEDING *URINARY PROBLEMS (pain or burning when urinating, or frequent urination) *BOWEL PROBLEMS (unusual diarrhea, constipation, pain near the anus) TENDERNESS IN MOUTH AND THROAT WITH OR WITHOUT PRESENCE OF ULCERS (sore throat, sores in mouth, or a toothache) UNUSUAL RASH, SWELLING OR PAIN  UNUSUAL VAGINAL DISCHARGE OR ITCHING   Items with * indicate a potential emergency and should be followed up as soon as possible or go to the Emergency Department if any problems should occur.  Please show the CHEMOTHERAPY ALERT CARD or IMMUNOTHERAPY  ALERT CARD at check-in to the Emergency Department and triage nurse.  Should you have questions after your visit or need to cancel or reschedule your appointment, please contact [facility] [phone]  and follow the prompts.  Office hours are 8:00 a.m. to 4:30 p.m. Monday - Friday. Please note that voicemails left after 4:00 p.m. may not be returned until the following business day.  We are closed weekends and major holidays. You have access to a nurse at all times for urgent questions. Please call the main number to the clinic [phone] and follow the prompts.  For any non-urgent questions, you may also contact your provider using MyChart. We now offer e-Visits for anyone 5 and older to request care online for non-urgent symptoms. For details visit [email or web address].   Also download the MyChart app! Go to the app store, search "MyChart", open the app, select , and log in with your MyChart username and password.  Masks are optional in the cancer centers. If you would like for your care team to wear a mask while they are taking care of you, please let them know. You may have one support person who is at least 83 years old accompany you for your appointments.

## 2022-08-18 NOTE — Progress Notes (Signed)
Patients port flushed without difficulty.  Good blood return noted with no bruising or swelling noted at site.  Stable during access and blood draw.  Patient to remain accessed for treatment. 

## 2022-08-18 NOTE — Progress Notes (Signed)
Patient has been assessed, vital signs and labs have been reviewed by Dr. Delton Coombes. No treatment today. Patient to receive 578m NS over one hour per Dr. KDelton Coombes Primary RN and pharmacy aware.

## 2022-08-18 NOTE — Progress Notes (Signed)
DISCONTINUE OFF PATHWAY REGIMEN - Colorectal   [id number]:Pembrolizumab 200 mg q21 Days:   A cycle is 21 days:     Pembrolizumab   **Always confirm dose/schedule in your pharmacy ordering system**  REASON: Other Reason PRIOR TREATMENT: Off Pathway: Pembrolizumab 200 mg q21 Days TREATMENT RESPONSE: Complete Response (CR)  START OFF PATHWAY REGIMEN - Colorectal   [id number]:Nivolumab 480 mg IV D1 q28 Days:   A cycle is every 28 days:     Nivolumab   **Always confirm dose/schedule in your pharmacy ordering system**  Patient Characteristics: Distant Metastases, Nonsurgical Candidate, BRAF V600 Positive and KRAS/NRAS Mutation Positive/Unknown Tumor Location: Colon Therapeutic Status: Distant Metastases Microsatellite/Mismatch Repair Status: MSI-H/dMMR BRAF Mutation Status: Mutation Positive KRAS/NRAS Mutation Status: Awaiting Test Results Intent of Therapy: Non-Curative / Palliative Intent, Discussed with Patient

## 2022-08-18 NOTE — Patient Instructions (Addendum)
[facility]  Discharge Instructions  You were seen and examined today by Dr. Delton Coombes.  Dr. Delton Coombes discussed your most recent lab work and PET scan which was good!  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 This is good news! 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 Because of your kidney function, you are not a candidate for chemotherapy. Dr. Delton Coombes has discussed a year of immunotherapy to prevent recurrence. You are currently on Keytruda, but it is not approved for use in kidney cancer, therefore Dr. Delton Coombes has recommended switching to [facility] for one year (or as long as it helps). It is given similarly to [facility], except you will get it every 4 weeks instead of every 3 weeks.  Follow-up as scheduled.  Thank you for choosing [facility] to provide your oncology and hematology care.   To afford each patient quality time with our provider, please arrive at least 15 minutes before your scheduled appointment time. You may need to reschedule your appointment if you arrive late (10 or more minutes). Arriving late affects you and other patients whose appointments are after yours.  Also, if you miss three or more appointments without notifying the office, you may be dismissed from the clinic at the provider's discretion.    Again, thank you for choosing [facility].  Our hope is that these requests will decrease the amount of time that you wait before being seen by our physicians.   If you have a lab appointment with the [facility] please come in thru the Main Entrance and check in at the main information desk.           _____________________________________________________________  Should you have questions after your visit to [facility], please contact our office at [phone] and follow the prompts.  Our office hours  are 8:00 a.m. to 4:30 p.m. Monday - Thursday and 8:00 a.m. to 2:30 p.m. Friday.  Please note that voicemails left after 4:00 p.m. may not be returned until the following business day.  We are closed weekends and all major holidays.  You do have access to a nurse 24-7, just call the main number to the clinic [phone] and do not press any options, hold on the line and a nurse will answer the phone.    For prescription refill requests, have your pharmacy contact our office and allow 72 hours.    Masks are optional in the cancer centers. If you would like for your care team to wear a mask while they are taking care of you, please let them know. You may have one support person who is at least 83 years old accompany you for your appointments.

## 2022-08-19 ENCOUNTER — Ambulatory Visit: Payer: Medicare Other | Admitting: Hematology

## 2022-08-20 ENCOUNTER — Other Ambulatory Visit: Payer: Self-pay | Admitting: Hematology

## 2022-08-22 ENCOUNTER — Other Ambulatory Visit: Payer: Self-pay

## 2022-08-25 NOTE — Progress Notes (Signed)
Pharmacist Chemotherapy Monitoring - Initial Assessment    Anticipated start date: 08/27/22   The following has been reviewed per standard work regarding the patient's treatment regimen: The patient's diagnosis, treatment plan and drug doses, and organ/hematologic function Lab orders and baseline tests specific to treatment regimen  The treatment plan start date, drug sequencing, and pre-medications Prior authorization status  Patient's documented medication list, including drug-drug interaction screen and prescriptions for anti-emetics and supportive care specific to the treatment regimen The drug concentrations, fluid compatibility, administration routes, and timing of the medications to be used The patient's access for treatment and lifetime cumulative dose history, if applicable  The patient's medication allergies and previous infusion related reactions, if applicable   Changes made to treatment plan:  N/A  Follow up needed:  N/A   Wynona Neat, [facility], 08/25/2022  2:10 PM

## 2022-08-26 ENCOUNTER — Other Ambulatory Visit: Payer: Self-pay

## 2022-08-27 ENCOUNTER — Other Ambulatory Visit: Payer: Self-pay

## 2022-08-27 ENCOUNTER — Inpatient Hospital Stay: Payer: Medicare Other

## 2022-08-27 ENCOUNTER — Inpatient Hospital Stay: Payer: Medicare Other | Attending: Hematology

## 2022-08-27 VITALS — BP 115/59 | HR 70 | Temp 96.6°F | Resp 18 | Wt 146.2 lb

## 2022-08-27 VITALS — BP 153/55 | HR 53 | Temp 96.4°F | Resp 18

## 2022-08-27 DIAGNOSIS — C182 Malignant neoplasm of ascending colon: Secondary | ICD-10-CM

## 2022-08-27 DIAGNOSIS — D649 Anemia, unspecified: Secondary | ICD-10-CM | POA: Diagnosis not present

## 2022-08-27 DIAGNOSIS — Z5112 Encounter for antineoplastic immunotherapy: Secondary | ICD-10-CM | POA: Diagnosis not present

## 2022-08-27 DIAGNOSIS — Z95828 Presence of other vascular implants and grafts: Secondary | ICD-10-CM

## 2022-08-27 DIAGNOSIS — C642 Malignant neoplasm of left kidney, except renal pelvis: Secondary | ICD-10-CM

## 2022-08-27 DIAGNOSIS — C652 Malignant neoplasm of left renal pelvis: Secondary | ICD-10-CM | POA: Diagnosis not present

## 2022-08-27 DIAGNOSIS — C773 Secondary and unspecified malignant neoplasm of axilla and upper limb lymph nodes: Secondary | ICD-10-CM | POA: Insufficient documentation

## 2022-08-27 LAB — CBC WITH DIFFERENTIAL/PLATELET
Abs Immature Granulocytes: 0.01 10*3/uL (ref 0.00–0.07)
Basophils Absolute: 0.1 10*3/uL (ref 0.0–0.1)
Basophils Relative: 2 %
Eosinophils Absolute: 0.1 10*3/uL (ref 0.0–0.5)
Eosinophils Relative: 2 %
HCT: 41.1 % (ref 36.0–46.0)
Hemoglobin: 13.2 g/dL (ref 12.0–15.0)
Immature Granulocytes: 0 %
Lymphocytes Relative: 24 %
Lymphs Abs: 1.3 10*3/uL (ref 0.7–4.0)
MCH: 29.2 pg (ref 26.0–34.0)
MCHC: 32.1 g/dL (ref 30.0–36.0)
MCV: 90.9 fL (ref 80.0–100.0)
Monocytes Absolute: 0.3 10*3/uL (ref 0.1–1.0)
Monocytes Relative: 6 %
Neutro Abs: 3.6 10*3/uL (ref 1.7–7.7)
Neutrophils Relative %: 66 %
Platelets: 168 10*3/uL (ref 150–400)
RBC: 4.52 MIL/uL (ref 3.87–5.11)
RDW: 17.7 % — ABNORMAL HIGH (ref 11.5–15.5)
WBC: 5.4 10*3/uL (ref 4.0–10.5)
nRBC: 0 % (ref 0.0–0.2)

## 2022-08-27 LAB — COMPREHENSIVE METABOLIC PANEL
ALT: 12 U/L (ref 0–44)
AST: 17 U/L (ref 15–41)
Albumin: 3.8 g/dL (ref 3.5–5.0)
Alkaline Phosphatase: 82 U/L (ref 38–126)
Anion gap: 9 (ref 5–15)
BUN: 22 mg/dL (ref 8–23)
CO2: 23 mmol/L (ref 22–32)
Calcium: 9.5 mg/dL (ref 8.9–10.3)
Chloride: 108 mmol/L (ref 98–111)
Creatinine, Ser: 2.41 mg/dL — ABNORMAL HIGH (ref 0.44–1.00)
GFR, Estimated: 19 mL/min — ABNORMAL LOW (ref >60)
Glucose, Bld: 107 mg/dL — ABNORMAL HIGH (ref 70–99)
Potassium: 3.6 mmol/L (ref 3.5–5.1)
Sodium: 140 mmol/L (ref 135–145)
Total Bilirubin: 0.8 mg/dL (ref 0.3–1.2)
Total Protein: 6.6 g/dL (ref 6.5–8.1)

## 2022-08-27 LAB — TSH: TSH: 5.129 u[IU]/mL — ABNORMAL HIGH (ref 0.350–4.500)

## 2022-08-27 MED ORDER — SODIUM CHLORIDE 0.9% FLUSH
10.0000 mL | INTRAVENOUS | Status: DC | PRN
  Administered 2022-08-27: 10 mL

## 2022-08-27 MED ORDER — SODIUM CHLORIDE 0.9 % IV SOLN: Freq: Once | INTRAVENOUS | Status: AC

## 2022-08-27 MED ORDER — SODIUM CHLORIDE 0.9% FLUSH
10.0000 mL | Freq: Once | INTRAVENOUS | Status: AC
  Administered 2022-08-27: 10 mL via INTRAVENOUS

## 2022-08-27 MED ORDER — HEPARIN SOD (PORK) LOCK FLUSH 100 UNIT/ML IV SOLN
500.0000 [IU] | Freq: Once | INTRAVENOUS | Status: AC | PRN
  Administered 2022-08-27: 500 [IU]

## 2022-08-27 MED ORDER — SODIUM CHLORIDE 0.9 % IV SOLN
480.0000 mg | Freq: Once | INTRAVENOUS | Status: AC
  Administered 2022-08-27: 480 mg via INTRAVENOUS
  Filled 2022-08-27: qty 48

## 2022-08-27 NOTE — Patient Instructions (Signed)
[facility]  Discharge Instructions: Thank you for choosing [facility] to provide your oncology and hematology care.  If you have a lab appointment with the Cancer Center, please come in thru the Main Entrance and check in at the main information desk.  Wear comfortable clothing and clothing appropriate for easy access to any Portacath or PICC line.   We strive to give you quality time with your provider. You may need to reschedule your appointment if you arrive late (15 or more minutes).  Arriving late affects you and other patients whose appointments are after yours.  Also, if you miss three or more appointments without notifying the office, you may be dismissed from the clinic at the provider's discretion.      For prescription refill requests, have your pharmacy contact our office and allow 72 hours for refills to be completed.    Today you received the following chemotherapy and/or immunotherapy agents Opdivo      To help prevent nausea and vomiting after your treatment, we encourage you to take your nausea medication as directed.  BELOW ARE SYMPTOMS THAT SHOULD BE REPORTED IMMEDIATELY: *FEVER GREATER THAN 100.4 F (38 C) OR HIGHER *CHILLS OR SWEATING *NAUSEA AND VOMITING THAT IS NOT CONTROLLED WITH YOUR NAUSEA MEDICATION *UNUSUAL SHORTNESS OF BREATH *UNUSUAL BRUISING OR BLEEDING *URINARY PROBLEMS (pain or burning when urinating, or frequent urination) *BOWEL PROBLEMS (unusual diarrhea, constipation, pain near the anus) TENDERNESS IN MOUTH AND THROAT WITH OR WITHOUT PRESENCE OF ULCERS (sore throat, sores in mouth, or a toothache) UNUSUAL RASH, SWELLING OR PAIN  UNUSUAL VAGINAL DISCHARGE OR ITCHING   Items with * indicate a potential emergency and should be followed up as soon as possible or go to the Emergency Department if any problems should occur.  Please show the CHEMOTHERAPY ALERT CARD or IMMUNOTHERAPY ALERT CARD at check-in to the Emergency  Department and triage nurse.  Should you have questions after your visit or need to cancel or reschedule your appointment, please contact [facility] [phone]  and follow the prompts.  Office hours are 8:00 a.m. to 4:30 p.m. Monday - Friday. Please note that voicemails left after 4:00 p.m. may not be returned until the following business day.  We are closed weekends and major holidays. You have access to a nurse at all times for urgent questions. Please call the main number to the clinic [phone] and follow the prompts.  For any non-urgent questions, you may also contact your provider using MyChart. We now offer e-Visits for anyone 18 and older to request care online for non-urgent symptoms. For details visit [email or web address].   Also download the MyChart app! Go to the app store, search "MyChart", open the app, select [facility], and log in with your MyChart username and password.  Masks are optional in the cancer centers. If you would like for your care team to wear a mask while they are taking care of you, please let them know. You may have one support person who is at least 83 years old accompany you for your appointments.  

## 2022-08-27 NOTE — Progress Notes (Signed)
Patient presents today for D1C1 Opdivo infusion per providers order.  Vital signs within parameters for treatment.  Creatinine noted to be 2.41.  Dr. Delton Coombes notified, message received to proceed with treatment.  Treatment given today per MD orders.  Stable during infusion without adverse affects.  Vital signs stable.  No complaints at this time.  Discharge from clinic ambulatory in stable condition.  Alert and oriented X 3.  Follow up with [facility] as scheduled.

## 2022-08-27 NOTE — Progress Notes (Signed)
Ok to treat with creatinine 2.41  T.O. Dr Rhys Martini, PharmD

## 2022-08-28 ENCOUNTER — Telehealth: Payer: Self-pay

## 2022-08-28 LAB — T4: T4, Total: 5.8 ug/dL (ref 4.5–12.0)

## 2022-08-28 NOTE — Telephone Encounter (Signed)
24 hour follow up call made to check up on patient since she got her 1st dose of opdivo yesterday. Patient states she feels fine, cannot tell any difference. RN educated patient on side effects of opdivo again. Patient understood.

## 2022-09-01 ENCOUNTER — Other Ambulatory Visit: Payer: Self-pay | Admitting: Hematology

## 2022-09-04 ENCOUNTER — Other Ambulatory Visit: Payer: Self-pay | Admitting: Hematology

## 2022-09-08 ENCOUNTER — Ambulatory Visit: Payer: Medicare Other | Admitting: Hematology

## 2022-09-08 ENCOUNTER — Ambulatory Visit: Payer: Medicare Other

## 2022-09-08 ENCOUNTER — Other Ambulatory Visit: Payer: Medicare Other

## 2022-09-22 ENCOUNTER — Other Ambulatory Visit: Payer: Self-pay

## 2022-09-24 ENCOUNTER — Inpatient Hospital Stay: Payer: Medicare Other

## 2022-09-24 ENCOUNTER — Inpatient Hospital Stay: Payer: Medicare Other | Admitting: Hematology

## 2022-09-24 ENCOUNTER — Other Ambulatory Visit: Payer: Self-pay

## 2022-09-25 ENCOUNTER — Other Ambulatory Visit: Payer: Self-pay

## 2022-09-28 ENCOUNTER — Other Ambulatory Visit: Payer: Self-pay | Admitting: Hematology

## 2022-09-29 ENCOUNTER — Inpatient Hospital Stay (HOSPITAL_BASED_OUTPATIENT_CLINIC_OR_DEPARTMENT_OTHER): Payer: Medicare Other | Admitting: Hematology

## 2022-09-29 ENCOUNTER — Other Ambulatory Visit: Payer: Self-pay

## 2022-09-29 ENCOUNTER — Other Ambulatory Visit: Payer: Medicare Other

## 2022-09-29 ENCOUNTER — Inpatient Hospital Stay: Payer: Medicare Other | Attending: Hematology

## 2022-09-29 ENCOUNTER — Ambulatory Visit: Payer: Medicare Other

## 2022-09-29 ENCOUNTER — Ambulatory Visit: Payer: Medicare Other | Admitting: Hematology

## 2022-09-29 ENCOUNTER — Inpatient Hospital Stay: Payer: Medicare Other

## 2022-09-29 VITALS — BP 149/77 | HR 65 | Temp 96.0°F | Resp 18

## 2022-09-29 DIAGNOSIS — C652 Malignant neoplasm of left renal pelvis: Secondary | ICD-10-CM | POA: Insufficient documentation

## 2022-09-29 DIAGNOSIS — Z79811 Long term (current) use of aromatase inhibitors: Secondary | ICD-10-CM | POA: Diagnosis not present

## 2022-09-29 DIAGNOSIS — C50911 Malignant neoplasm of unspecified site of right female breast: Secondary | ICD-10-CM | POA: Insufficient documentation

## 2022-09-29 DIAGNOSIS — Z5112 Encounter for antineoplastic immunotherapy: Secondary | ICD-10-CM | POA: Diagnosis not present

## 2022-09-29 DIAGNOSIS — C642 Malignant neoplasm of left kidney, except renal pelvis: Secondary | ICD-10-CM

## 2022-09-29 DIAGNOSIS — Z79899 Other long term (current) drug therapy: Secondary | ICD-10-CM | POA: Insufficient documentation

## 2022-09-29 DIAGNOSIS — I10 Essential (primary) hypertension: Secondary | ICD-10-CM | POA: Diagnosis not present

## 2022-09-29 DIAGNOSIS — R7989 Other specified abnormal findings of blood chemistry: Secondary | ICD-10-CM

## 2022-09-29 DIAGNOSIS — C189 Malignant neoplasm of colon, unspecified: Secondary | ICD-10-CM

## 2022-09-29 DIAGNOSIS — M810 Age-related osteoporosis without current pathological fracture: Secondary | ICD-10-CM | POA: Insufficient documentation

## 2022-09-29 DIAGNOSIS — C182 Malignant neoplasm of ascending colon: Secondary | ICD-10-CM

## 2022-09-29 DIAGNOSIS — Z85828 Personal history of other malignant neoplasm of skin: Secondary | ICD-10-CM | POA: Insufficient documentation

## 2022-09-29 DIAGNOSIS — Z95828 Presence of other vascular implants and grafts: Secondary | ICD-10-CM

## 2022-09-29 DIAGNOSIS — E039 Hypothyroidism, unspecified: Secondary | ICD-10-CM | POA: Diagnosis not present

## 2022-09-29 DIAGNOSIS — Z170 Estrogen receptor positive status [ER+]: Secondary | ICD-10-CM | POA: Insufficient documentation

## 2022-09-29 DIAGNOSIS — Z85038 Personal history of other malignant neoplasm of large intestine: Secondary | ICD-10-CM | POA: Diagnosis not present

## 2022-09-29 LAB — CBC WITH DIFFERENTIAL/PLATELET
Abs Immature Granulocytes: 0.01 10*3/uL (ref 0.00–0.07)
Basophils Absolute: 0.1 10*3/uL (ref 0.0–0.1)
Basophils Relative: 1 %
Eosinophils Absolute: 0.1 10*3/uL (ref 0.0–0.5)
Eosinophils Relative: 1 %
HCT: 42.2 % (ref 36.0–46.0)
Hemoglobin: 13.3 g/dL (ref 12.0–15.0)
Immature Granulocytes: 0 %
Lymphocytes Relative: 21 %
Lymphs Abs: 0.9 10*3/uL (ref 0.7–4.0)
MCH: 28.8 pg (ref 26.0–34.0)
MCHC: 31.5 g/dL (ref 30.0–36.0)
MCV: 91.3 fL (ref 80.0–100.0)
Monocytes Absolute: 0.4 10*3/uL (ref 0.1–1.0)
Monocytes Relative: 9 %
Neutro Abs: 2.9 10*3/uL (ref 1.7–7.7)
Neutrophils Relative %: 68 %
Platelets: 147 10*3/uL — ABNORMAL LOW (ref 150–400)
RBC: 4.62 MIL/uL (ref 3.87–5.11)
RDW: 14.5 % (ref 11.5–15.5)
WBC: 4.4 10*3/uL (ref 4.0–10.5)
nRBC: 0 % (ref 0.0–0.2)

## 2022-09-29 LAB — COMPREHENSIVE METABOLIC PANEL
ALT: 26 U/L (ref 0–44)
AST: 16 U/L (ref 15–41)
Albumin: 4.2 g/dL (ref 3.5–5.0)
Alkaline Phosphatase: 93 U/L (ref 38–126)
Anion gap: 7 (ref 5–15)
BUN: 30 mg/dL — ABNORMAL HIGH (ref 8–23)
CO2: 22 mmol/L (ref 22–32)
Calcium: 9.4 mg/dL (ref 8.9–10.3)
Chloride: 108 mmol/L (ref 98–111)
Creatinine, Ser: 2.64 mg/dL — ABNORMAL HIGH (ref 0.44–1.00)
GFR, Estimated: 17 mL/min — ABNORMAL LOW (ref >60)
Glucose, Bld: 115 mg/dL — ABNORMAL HIGH (ref 70–99)
Potassium: 3.5 mmol/L (ref 3.5–5.1)
Sodium: 137 mmol/L (ref 135–145)
Total Bilirubin: 0.7 mg/dL (ref 0.3–1.2)
Total Protein: 7.5 g/dL (ref 6.5–8.1)

## 2022-09-29 LAB — TSH: TSH: 5.65 u[IU]/mL — ABNORMAL HIGH (ref 0.350–4.500)

## 2022-09-29 MED ORDER — SODIUM CHLORIDE 0.9% FLUSH
10.0000 mL | Freq: Once | INTRAVENOUS | Status: AC
  Administered 2022-09-29: 10 mL via INTRAVENOUS

## 2022-09-29 MED ORDER — SODIUM CHLORIDE 0.9% FLUSH
10.0000 mL | INTRAVENOUS | Status: DC | PRN
  Administered 2022-09-29: 10 mL

## 2022-09-29 MED ORDER — SODIUM CHLORIDE 0.9 % IV SOLN
480.0000 mg | Freq: Once | INTRAVENOUS | Status: AC
  Administered 2022-09-29: 480 mg via INTRAVENOUS
  Filled 2022-09-29: qty 48

## 2022-09-29 MED ORDER — SODIUM CHLORIDE 0.9 % IV SOLN: Freq: Once | INTRAVENOUS | Status: AC

## 2022-09-29 MED ORDER — HEPARIN SOD (PORK) LOCK FLUSH 100 UNIT/ML IV SOLN
500.0000 [IU] | Freq: Once | INTRAVENOUS | Status: AC | PRN
  Administered 2022-09-29: 500 [IU]

## 2022-09-29 MED ORDER — SODIUM CHLORIDE 0.9 % IV SOLN: INTRAVENOUS | Status: DC

## 2022-09-29 NOTE — Patient Instructions (Addendum)
[facility] at [facility] Discharge Instructions   You were seen and examined today by Dr. Delton Coombes.  He reviewed the results of your lab work. You kidney number has gone up to 2.6 from 2.4 last check. Your TSH (thyroid number) is also elevated at 5.65, and is up from 5.12 at last visit. Drink plenty of water, at least 80 ounces per day. Continue levothyroxine as prescribed.   We will proceed with your treatment today.  Return as scheduled in 4 weeks.    Thank you for choosing [facility] at [place] to provide your oncology and hematology care.  To afford each patient quality time with our provider, please arrive at least 15 minutes before your scheduled appointment time.   If you have a lab appointment with the [facility] please come in thru the Main Entrance and check in at the main information desk.  You need to re-schedule your appointment should you arrive 10 or more minutes late.  We strive to give you quality time with our providers, and arriving late affects you and other patients whose appointments are after yours.  Also, if you no show three or more times for appointments you may be dismissed from the clinic at the providers discretion.     Again, thank you for choosing [facility].  Our hope is that these requests will decrease the amount of time that you wait before being seen by our physicians.       _____________________________________________________________  Should you have questions after your visit to [facility], please contact our office at [phone] and follow the prompts.  Our office hours are 8:00 a.m. and 4:30 p.m. Monday - Friday.  Please note that voicemails left after 4:00 p.m. may not be returned until the following business day.  We are closed weekends and major holidays.  You do have access to a nurse 24-7, just call the main number to the clinic [phone] and do not press any  options, hold on the line and a nurse will answer the phone.    For prescription refill requests, have your pharmacy contact our office and allow 72 hours.    Due to Covid, you will need to wear a mask upon entering the hospital. If you do not have a mask, a mask will be given to you at the Main Entrance upon arrival. For doctor visits, patients may have 1 support person age 34 or older with them. For treatment visits, patients can not have anyone with them due to social distancing guidelines and our immunocompromised population.

## 2022-09-29 NOTE — Progress Notes (Signed)
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 Robotic proximal colectomy on 10/11/2019. 2. Right breast lumpectomy and SLNB on 04/02/2020 3.  Left radical nephro ureterectomy, lymphadenectomy, bladder cuff excision on 07/13/2022  NGS Results: MSI--high, Foundation 1 not sent  CURRENT THERAPY: Opdivo every 4 weeks  BRIEF ONCOLOGIC HISTORY:  Oncology History  Cancer of ascending colon s/p robotic proximal colectomy 10/11/2019  10/11/2019 Initial Diagnosis   Cancer of ascending colon s/p robotic proximal colectomy 10/11/2019   10/23/2019 Cancer Staging   Staging form: Colon and Rectum, AJCC 8th Edition - Clinical stage from 10/23/2019: Stage IVA (cT3, cN1c, cM1a) - Signed by Derek Jack, MD on 12/14/2019   05/14/2020 - 06/26/2022 Chemotherapy   Patient is on Treatment Plan : COLORECTAL Pembrolizumab q21d     05/14/2020 - 06/26/2022 Chemotherapy   Patient is on Treatment Plan : COLORECTAL Pembrolizumab (200) q21d     08/27/2022 -  Chemotherapy   Patient is on Treatment Plan : COLORECTAL Nivolumab (480) q28d     Malignant neoplasm of right female breast ([place])  04/23/2020 Initial Diagnosis   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 ([place])   04/23/2020 Cancer Staging   Staging form: Breast, AJCC 8th Edition - Clinical stage from 04/23/2020: Stage IA (cT1c, cN0(sn), cM0, G2, ER+, PR+, HER2-) - Signed by Derek Jack, MD on 04/23/2020   Urothelial carcinoma of kidney, left ([place])  08/18/2022 Initial Diagnosis   Urothelial carcinoma of kidney, left ([place])   08/27/2022 -  Chemotherapy   Patient is on Treatment Plan : COLORECTAL Nivolumab (480) q28d       CANCER STAGING:   Cancer Staging  Cancer of ascending colon s/p robotic proximal colectomy 10/11/2019 Staging form: Colon and Rectum, AJCC 8th Edition - Clinical stage from 10/23/2019: Stage IVA (cT3, cN1c, cM1a) - Signed by Derek Jack, MD on 12/14/2019  Malignant neoplasm of right female breast [facility]) Staging form: Breast, AJCC 8th Edition - Clinical stage from 04/23/2020: Stage IA (cT1c, cN0(sn), cM0, G2, ER+, PR+, HER2-) - Signed by Derek Jack, MD on 04/23/2020  Urothelial carcinoma of kidney, left [facility]) Staging form: Kidney, AJCC 8th Edition - Clinical stage from 08/18/2022: Stage III (cT3, cN0, cM0) - Unsigned   INTERVAL HISTORY:  [name], a 81 y.o. female, seen for follow-up of metastatic colon cancer and urothelial carcinoma of the left kidney.  She was started on opdivo on 08/27/2022.  She reportedly had sore throat last Wednesday which has improved.  Now she has cough with light green expectoration and stuffy nose which is getting better.  She is taking Cold-Eeze.  Denies any immunotherapy related side effects from opdivo.  REVIEW OF SYSTEMS:  Review of Systems  Constitutional:  Positive for fatigue.  Respiratory:  Positive for cough (Light green sputum).   All other systems reviewed and are negative.   PAST MEDICAL/SURGICAL HISTORY:  Past Medical History:  Diagnosis Date   Abdominal aortic aneurysm (HCC)    Acid reflux    Anemia    Arthritis    knees , R shoulder - tx /w injection - 11/2014   Basal cell carcinoma (BCC) of dorsum of nose 2010   Resolved   Cancer (  [place])    basal cell on nose   Colon cancer (HCC)    Coronary artery disease    Hypercholesteremia    Hypertension    Hypothyroidism    Lt Acute pyelonephritis 09/11/2018   MI, old 2000   Peripheral arterial disease ([place])    hhigh-grade ostial bilateral calcified iliac stenosis with claudication   Tobacco abuse    Vertigo    when lays on left side.   Past Surgical History:  Procedure Laterality  Date   BREAST LUMPECTOMY WITH RADIOACTIVE SEED AND SENTINEL LYMPH NODE BIOPSY Bilateral 04/02/2020   Procedure: BILATERAL BREAST LUMPECTOMY WITH RADIOACTIVE SEED AND RIGHT SENTINEL LYMPH NODE BIOPSY AND RIGHT TARGETED AXILLARY LYMPH NODE BIOPSY;  Surgeon: Erroll Luna, MD;  Location: [facility];  Service: General;  Laterality: Bilateral;   CARDIAC CATHETERIZATION  5 stents   CARDIAC CATHETERIZATION N/A 01/20/2016   Procedure: Left Heart Cath and Coronary Angiography;  Surgeon: Peter M Martinique, MD;  Location: [facility] CV LAB;  Service: Cardiovascular;  Laterality: N/A;   CATARACT EXTRACTION W/PHACO Left 05/24/2014   Procedure: CATARACT EXTRACTION PHACO AND INTRAOCULAR LENS PLACEMENT (IOC);  Surgeon: Tonny Branch, MD;  Location: [facility];  Service: Ophthalmology;  Laterality: Left;  CDE:  9.30   CATARACT EXTRACTION W/PHACO Right 06/18/2014   Procedure: CATARACT EXTRACTION PHACO AND INTRAOCULAR LENS PLACEMENT RIGHT EYE CDE=10.84;  Surgeon: Tonny Branch, MD;  Location: [facility];  Service: Ophthalmology;  Laterality: Right;   CORONARY ARTERY BYPASS GRAFT N/A 01/24/2016   Procedure: CORONARY ARTERY BYPASS GRAFTING (CABG);  Surgeon: Gaye Pollack, MD;  Location: [facility];  Service: Open Heart Surgery;  Laterality: N/A;   CORONARY STENT PLACEMENT     CORONARY STENT PLACEMENT  03/06/15   CFX DES   CYSTOSCOPY/URETEROSCOPY/HOLMIUM LASER/STENT PLACEMENT Left 09/12/2018   Procedure: CYSTOSCOPY/URETEROSCOPY/STENT PLACEMENT;  Surgeon: Ceasar Mons, MD;  Location: [facility];  Service: Urology;  Laterality: Left;   EYE SURGERY     LEFT HEART CATH AND CORS/GRAFTS ANGIOGRAPHY N/A 09/12/2019   Procedure: LEFT HEART CATH AND CORS/GRAFTS ANGIOGRAPHY;  Surgeon: Troy Sine, MD;  Location: [place] CV [facility];  Service: Cardiovascular;  Laterality: N/A;   LEFT HEART CATHETERIZATION WITH CORONARY ANGIOGRAM N/A 03/06/2015   Procedure: LEFT HEART CATHETERIZATION WITH CORONARY ANGIOGRAM;  Surgeon: Lorretta Harp, MD;  Location: [facility] CATH LAB;  Service: Cardiovascular;  Laterality: N/A;   PARTIAL KNEE ARTHROPLASTY Right 02/04/2015   Procedure: UNICOMPARTMENTAL KNEE;  Surgeon: Dorna Leitz, MD;  Location: [facility];  Service: Orthopedics;  Laterality: Right;   PERIPHERAL VASCULAR CATHETERIZATION Bilateral 05/13/2015   Procedure: Lower Extremity Angiography;  Surgeon: Lorretta Harp, MD;  Location: [place] [facility];  Service: Cardiovascular;  Laterality: Bilateral;   PERIPHERAL VASCULAR CATHETERIZATION N/A 05/13/2015   Procedure: Abdominal Aortogram;  Surgeon: Lorretta Harp, MD;  Location: [place] CV [facility];  Service: Cardiovascular;  Laterality: N/A;   PERIPHERAL VASCULAR CATHETERIZATION Bilateral 06/27/2015   Procedure: Peripheral Vascular Intervention;  Surgeon: Lorretta Harp, MD;  Location: [place] CV LAB;  Service: Cardiovascular;  Laterality: Bilateral;  ILIACS   PERIPHERAL VASCULAR CATHETERIZATION Bilateral 06/27/2015   Procedure: Peripheral Vascular Atherectomy;  Surgeon: Lorretta Harp, MD;  Location: [place] CV [facility];  Service: Cardiovascular;  Laterality: Bilateral;   PORTACATH PLACEMENT Left 05/24/2020   Procedure: INSERTION PORT-A-CATH;  Surgeon: Aviva Signs, MD;  [facility];  Service: General;  Laterality: Left;   TEE WITHOUT CARDIOVERSION N/A 01/24/2016   Procedure: TRANSESOPHAGEAL  ECHOCARDIOGRAM (TEE);  Surgeon: Gaye Pollack, MD;  Location: [facility] [place];  Service: Open Heart Surgery;  Laterality: N/A;   TONSILLECTOMY     age 73   TUBAL LIGATION      SOCIAL HISTORY:  Social History   Socioeconomic History   Marital status: Married    Spouse name: Tommy   Number of children: 4   Years of education: Not on file   Highest education level: Not on file  Occupational History   Occupation: retired    Comment: worked as Presenter, broadcasting for [employer]  Tobacco Use   Smoking status: Former    Packs/day: 1.50    Years: 42.00    Total pack years: 63.00    Types:  Cigarettes    Quit date: 05/19/1999    Years since quitting: 23.3   Smokeless tobacco: Never  Vaping Use   Vaping Use: Never used  Substance and Sexual Activity   Alcohol use: No   Drug use: No   Sexual activity: Yes    Birth control/protection: Surgical  Other Topics Concern   Not on file  Social History Narrative   Not on file   Social Determinants of Health   Financial Resource Strain: [place] (02/18/2021)   Overall Financial Resource Strain (CARDIA)    Difficulty of Paying Living Expenses: Somewhat hard  Food Insecurity: No Food Insecurity (02/18/2021)   Hunger Vital Sign    Worried About Running Out of Food in the Last Year: Never true    [place] in the Last Year: Never true  Transportation Needs: No Transportation Needs (02/18/2021)   PRAPARE - Hydrologist (Medical): No    Lack of Transportation (Non-Medical): No  Physical Activity: Inactive (02/18/2021)   Exercise Vital Sign    Days of Exercise per Week: 0 days    Minutes of Exercise per Session: 0 min  Stress: No Stress Concern Present (02/18/2021)   [place]    Feeling of Stress : Not at all  Social Connections: Moderately Isolated (02/18/2021)   Social Connection and Isolation Panel [NHANES]    Frequency of Communication with Friends and Family: More than three times a week    Frequency of Social Gatherings with Friends and Family: Three times a week    Attends Religious Services: Never    Active Member of Clubs or Organizations: No    Attends Archivist Meetings: Never    Marital Status: Married  Human resources officer Violence: Not At Risk (02/18/2021)   Humiliation, Afraid, Rape, and Kick questionnaire    Fear of Current or Ex-Partner: No    Emotionally Abused: No    Physically Abused: No    Sexually Abused: No    FAMILY HISTORY:  Family History  Problem Relation Age of Onset   Ovarian cancer  Mother 105   Cancer Father 39       unsure of which kind, "it was in his glands"   Hypertension Maternal Grandmother    Stroke Maternal Grandfather    Hypertension Son    Brain cancer Sister    Hypertension Daughter    Heart attack Neg Hx    Colon cancer Neg Hx    Esophageal cancer Neg Hx    Inflammatory bowel disease Neg Hx    Liver disease Neg Hx    Pancreatic cancer Neg Hx    Rectal cancer Neg Hx    Stomach cancer  Neg Hx     CURRENT MEDICATIONS:  Current Outpatient Medications  Medication Sig Dispense Refill   amLODipine (NORVASC) 5 MG tablet TAKE 1 TABLET BY MOUTH ONCE A DAY. 30 tablet 4   anastrozole (ARIMIDEX) 1 MG tablet TAKE 1 TABLET BY MOUTH ONCE DAILY. 30 tablet 6   aspirin EC 81 MG tablet Take 81 mg by mouth every evening.      atorvastatin (LIPITOR) 40 MG tablet TAKE (1) TABLET BY MOUTH ONCE DAILY. KEEP FUTURE APPOINTMENTS FOR ANY FURTHER REFILLS. 90 tablet 3   celecoxib (CELEBREX) 100 MG capsule Take 100 mg by mouth 2 (two) times daily.     clopidogrel (PLAVIX) 75 MG tablet Take 1 tablet (75 mg total) by mouth daily. FILLS. 90 tablet 3   esomeprazole (NEXIUM) 20 MG capsule Take 20 mg by mouth daily.      HYDROcodone-acetaminophen (NORCO/VICODIN) 5-325 MG tablet Take 1 tablet by mouth daily. 15 tablet 0   hydrOXYzine (ATARAX) 25 MG tablet TAKE (1) TABLET BY MOUTH AT BEDTIME AS NEEDED 30 tablet 0   levothyroxine (SYNTHROID) 25 MCG tablet Take 25 mcg by mouth daily.     metoprolol tartrate (LOPRESSOR) 25 MG tablet TAKE 1 TABLET BY MOUTH TWICE DAILY. 160 tablet 3   nitroGLYCERIN (NITROSTAT) 0.4 MG SL tablet Place 1 tablet (0.4 mg total) under the tongue every 5 (five) minutes as needed for chest pain. 25 tablet prn   tamsulosin (FLOMAX) 0.4 MG CAPS capsule Take 0.4 mg by mouth daily.     Vitamin D, Ergocalciferol, (DRISDOL) 1.25 MG (50000 UNIT) CAPS capsule TAKE 1 CAPSULE BY MOUTH ONCE A WEEK. 4 capsule 0   No current facility-administered medications for this visit.     ALLERGIES:  Allergies  Allergen Reactions   Crab [Shellfish Allergy] Nausea And Vomiting    Throws up violently   Ezetimibe Diarrhea   Keflex [Cephalexin]     Caused sores   Other Nausea And Vomiting    Shrimp    PHYSICAL EXAM:  Performance status (ECOG): 1 - Symptomatic but completely ambulatory  There were no vitals filed for this visit.  Wt Readings from Last 3 Encounters:  09/29/22 146 lb 6.4 oz (66.4 kg)  08/27/22 146 lb 3.2 oz (66.3 kg)  08/18/22 144 lb 3.2 oz (65.4 kg)   Physical Exam Vitals reviewed.  Constitutional:      Appearance: Normal appearance.  Cardiovascular:     Rate and Rhythm: Normal rate and regular rhythm.     Pulses: Normal pulses.     Heart sounds: Normal heart sounds.  Pulmonary:     Effort: Pulmonary effort is normal.     Breath sounds: Normal breath sounds.  Neurological:     General: No focal deficit present.     Mental Status: She is alert and oriented to person, place, and time.  Psychiatric:        Mood and Affect: Mood normal.        Behavior: Behavior normal.    LABORATORY DATA:  I have reviewed the labs as listed.     Latest Ref Rng & Units 09/29/2022   12:06 PM 08/27/2022    8:44 AM 08/18/2022    8:22 AM  CBC  WBC 4.0 - 10.5 K/uL 4.4  5.4  5.2   Hemoglobin 12.0 - 15.0 g/dL 13.3  13.2  12.8   Hematocrit 36.0 - 46.0 % 42.2  41.1  40.0   Platelets 150 - 400 K/uL 147  168  152  Latest Ref Rng & Units 09/29/2022   12:06 PM 08/27/2022    8:44 AM 08/18/2022    8:22 AM  CMP  Glucose 70 - 99 mg/dL 115  107  111   BUN 8 - 23 mg/dL _0 Creatinine 0.44 - 1.00 mg/dL 2.64  2.41  2.45   Sodium 135 - 145 mmol/L 137  140  140   Potassium 3.5 - 5.1 mmol/L 3.5  3.6  3.5   Chloride 98 - 111 mmol/L 108  108  108   CO2 22 - 32 mmol/L _1 Calcium 8.9 - 10.3 mg/dL 9.4  9.5  9.6   Total Protein 6.5 - 8.1 g/dL 7.5  6.6  6.6   Total Bilirubin 0.3 - 1.2 mg/dL 0.7  0.8  0.4   Alkaline Phos 38 - 126 U/L 93  82  92    AST 15 - 41 U/L _2 ALT 0 - 44 U/L _3 DIAGNOSTIC IMAGING:  I have independently reviewed the scans and discussed with the patient. No results found.   ASSESSMENT:  1.  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  Right hemicolectomy on 10/11/2019, grade 3 adenocarcinoma, negative margins, positive LVSI, 2 tumor deposits, 0/15 lymph nodes positive, PT3PN1C. 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  MLH1 hyper methylation present. -PET scan in December 2020 showed lymph node in the right axillary region.  Biopsy consistent with metastatic colon cancer. -Last CEA was 10.7 on 11/22/2019. -Right axillary lymph node excision on 04/02/2020 consistent with metastatic carcinoma from colon cancer. -Pembrolizumab started on 05/14/2020. -PET scan on 08/05/2020 shows complete resolution of lymphadenopathy.  No evidence of new areas of uptake.  Mild vague residual hypermetabolism in the right axilla without soft tissue mass. -PET scan on 12/23/2020 with no evidence of recurrence.   2.  Stage I (PT1CPN0) right Breast, Grade 2 Invasive Lobular Carcinoma: -MRI of the breast on 12/25/2019 showed 1 cm mass behind the right areola with a suspicious mass in the left breast. -Right breast lumpectomy on 04/02/2020 shows invasive lobular carcinoma, grade 2, 1.2 cm.  Resection margins are negative.  Negative LVSI.  2 sentinel lymph nodes were negative for carcinoma.  ER/PR 100% positive, HER-2 negative, Ki-67 10%.  3.  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 - Based on her renal function, she is not a candidate for cisplatin based chemotherapy.  Nivolumab was recommended for 1 year. - PET scan (08/06/2022): No evidence of metastatic disease. - Opdivo for 1 year started on 08/27/2022   PLAN:  1.  Stage IV colon cancer to the right axillary lymph node: - PET scan on 08/06/2022 did not show any evidence of metastatic disease.  CEA was 9.0 on 06/04/2022. - As Keytruda not approved in the adjuvant setting for high-grade urothelial carcinoma, we have discontinued Keytruda and started on opdivo. - She has tolerated Opdivo very well.  Labs today shows normal LFTs and CBC.  Will consider repeating PET scan in 3  to 4 months.   2.  Right breast invasive lobular carcinoma, grade 2: - Mammogram on 03/31/2022 was BI-RADS Category 1. - Continue anastrozole daily.   3.  Osteoporosis: - Continue vitamin D 50,000 units weekly.   4.  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 - She has not had any immunotherapy related side effects. - Reviewed labs today.  Creatinine is elevated at 2.64.  We will give her final mL of normal saline.  She was told to increase her oral fluid intake. - RTC 4 weeks for follow-up.   5.  Hypothyroidism: - TSH today is 5.65 and trending up.  Continue Synthroid 25 mcg daily.  Will check TSH next visit.   Orders placed this encounter:  No orders of the defined types were placed in this encounter.     Derek Jack, MD [place] [phone]

## 2022-09-29 NOTE — Progress Notes (Signed)
Patient presents today for Opdivo, patient okay for treatment per Dr. Delton Coombes, with additional orders for additional 536m of normal saline.  Patient tolerated chemotherapy and additional fluids with no complaints voiced. Side effects with management reviewed understanding verbalized. Port site clean and dry with no bruising or swelling noted at site. Good blood return noted before and after administration of chemotherapy. Band aid applied. Patient left in satisfactory condition with VSS and no s/s of distress noted.

## 2022-09-29 NOTE — Patient Instructions (Signed)
[facility]  Discharge Instructions: Thank you for choosing [facility] to provide your oncology and hematology care.  If you have a lab appointment with the [facility], please come in thru the Main Entrance and check in at the main information desk.  Wear comfortable clothing and clothing appropriate for easy access to any Portacath or PICC line.   We strive to give you quality time with your provider. You may need to reschedule your appointment if you arrive late (15 or more minutes).  Arriving late affects you and other patients whose appointments are after yours.  Also, if you miss three or more appointments without notifying the office, you may be dismissed from the clinic at the provider's discretion.      For prescription refill requests, have your pharmacy contact our office and allow 72 hours for refills to be completed.    Today you received the following chemotherapy and/or immunotherapy agents Opdivo and normal saline, return as scheduled.   To help prevent nausea and vomiting after your treatment, we encourage you to take your nausea medication as directed.  BELOW ARE SYMPTOMS THAT SHOULD BE REPORTED IMMEDIATELY: *FEVER GREATER THAN 100.4 F (38 C) OR HIGHER *CHILLS OR SWEATING *NAUSEA AND VOMITING THAT IS NOT CONTROLLED WITH YOUR NAUSEA MEDICATION *UNUSUAL SHORTNESS OF BREATH *UNUSUAL BRUISING OR BLEEDING *URINARY PROBLEMS (pain or burning when urinating, or frequent urination) *BOWEL PROBLEMS (unusual diarrhea, constipation, pain near the anus) TENDERNESS IN MOUTH AND THROAT WITH OR WITHOUT PRESENCE OF ULCERS (sore throat, sores in mouth, or a toothache) UNUSUAL RASH, SWELLING OR PAIN  UNUSUAL VAGINAL DISCHARGE OR ITCHING   Items with * indicate a potential emergency and should be followed up as soon as possible or go to the Emergency Department if any problems should occur.  Please show the CHEMOTHERAPY ALERT CARD or IMMUNOTHERAPY  ALERT CARD at check-in to the Emergency Department and triage nurse.  Should you have questions after your visit or need to cancel or reschedule your appointment, please contact [facility] [phone]  and follow the prompts.  Office hours are 8:00 a.m. to 4:30 p.m. Monday - Friday. Please note that voicemails left after 4:00 p.m. may not be returned until the following business day.  We are closed weekends and major holidays. You have access to a nurse at all times for urgent questions. Please call the main number to the clinic [phone] and follow the prompts.  For any non-urgent questions, you may also contact your provider using MyChart. We now offer e-Visits for anyone 30 and older to request care online for non-urgent symptoms. For details visit [email or web address].   Also download the MyChart app! Go to the app store, search "MyChart", open the app, select [facility], and log in with your MyChart username and password.  Masks are optional in the cancer centers. If you would like for your care team to wear a mask while they are taking care of you, please let them know. You may have one support person who is at least 83 years old accompany you for your appointments.

## 2022-09-29 NOTE — Progress Notes (Signed)
Patient has been examined by Dr. Katragadda, and vital signs and labs have been reviewed. ANC, Creatinine, LFTs, hemoglobin, and platelets are within treatment parameters per M.D. - pt may proceed with treatment.  Primary RN and pharmacy notified.  

## 2022-10-02 ENCOUNTER — Other Ambulatory Visit: Payer: Self-pay

## 2022-10-02 MED ORDER — AMOXICILLIN-POT CLAVULANATE 875-125 MG PO TABS: 1.0000 | ORAL_TABLET | Freq: Two times a day (BID) | ORAL | 0 refills | Status: DC

## 2022-10-02 NOTE — Addendum Note (Signed)
Addended by: Derek Jack on: 10/02/2022 12:04 PM   Modules accepted: Orders

## 2022-10-08 DIAGNOSIS — Z8551 Personal history of malignant neoplasm of bladder: Secondary | ICD-10-CM | POA: Diagnosis not present

## 2022-10-20 ENCOUNTER — Other Ambulatory Visit: Payer: Self-pay

## 2022-10-22 ENCOUNTER — Ambulatory Visit: Payer: Medicare Other

## 2022-10-22 ENCOUNTER — Other Ambulatory Visit: Payer: Medicare Other

## 2022-10-22 ENCOUNTER — Ambulatory Visit: Payer: Medicare Other | Admitting: Hematology

## 2022-10-23 ENCOUNTER — Other Ambulatory Visit: Payer: Self-pay

## 2022-10-23 DIAGNOSIS — Z905 Acquired absence of kidney: Secondary | ICD-10-CM | POA: Diagnosis not present

## 2022-10-23 DIAGNOSIS — I120 Hypertensive chronic kidney disease with stage 5 chronic kidney disease or end stage renal disease: Secondary | ICD-10-CM | POA: Diagnosis not present

## 2022-10-23 DIAGNOSIS — Z951 Presence of aortocoronary bypass graft: Secondary | ICD-10-CM | POA: Diagnosis not present

## 2022-10-23 DIAGNOSIS — R7301 Impaired fasting glucose: Secondary | ICD-10-CM | POA: Diagnosis not present

## 2022-10-23 DIAGNOSIS — Z87891 Personal history of nicotine dependence: Secondary | ICD-10-CM | POA: Diagnosis not present

## 2022-10-23 DIAGNOSIS — Z87442 Personal history of urinary calculi: Secondary | ICD-10-CM | POA: Diagnosis not present

## 2022-10-23 DIAGNOSIS — N184 Chronic kidney disease, stage 4 (severe): Secondary | ICD-10-CM | POA: Diagnosis not present

## 2022-10-23 DIAGNOSIS — I2510 Atherosclerotic heart disease of native coronary artery without angina pectoris: Secondary | ICD-10-CM | POA: Diagnosis not present

## 2022-10-27 ENCOUNTER — Other Ambulatory Visit: Payer: Self-pay

## 2022-10-27 ENCOUNTER — Other Ambulatory Visit (HOSPITAL_COMMUNITY): Payer: Self-pay | Admitting: Cardiology

## 2022-10-27 DIAGNOSIS — I739 Peripheral vascular disease, unspecified: Secondary | ICD-10-CM

## 2022-10-27 DIAGNOSIS — C189 Malignant neoplasm of colon, unspecified: Secondary | ICD-10-CM

## 2022-10-29 ENCOUNTER — Inpatient Hospital Stay: Payer: Medicare Other

## 2022-10-29 ENCOUNTER — Inpatient Hospital Stay: Payer: Medicare Other | Admitting: Hematology

## 2022-10-29 ENCOUNTER — Inpatient Hospital Stay: Payer: Medicare Other | Attending: Hematology

## 2022-10-29 VITALS — BP 164/61 | HR 52 | Temp 97.5°F | Resp 18 | Wt 149.0 lb

## 2022-10-29 DIAGNOSIS — Z170 Estrogen receptor positive status [ER+]: Secondary | ICD-10-CM | POA: Insufficient documentation

## 2022-10-29 DIAGNOSIS — M810 Age-related osteoporosis without current pathological fracture: Secondary | ICD-10-CM | POA: Diagnosis not present

## 2022-10-29 DIAGNOSIS — Z5112 Encounter for antineoplastic immunotherapy: Secondary | ICD-10-CM | POA: Insufficient documentation

## 2022-10-29 DIAGNOSIS — C642 Malignant neoplasm of left kidney, except renal pelvis: Secondary | ICD-10-CM

## 2022-10-29 DIAGNOSIS — C773 Secondary and unspecified malignant neoplasm of axilla and upper limb lymph nodes: Secondary | ICD-10-CM | POA: Diagnosis not present

## 2022-10-29 DIAGNOSIS — E039 Hypothyroidism, unspecified: Secondary | ICD-10-CM | POA: Insufficient documentation

## 2022-10-29 DIAGNOSIS — C50911 Malignant neoplasm of unspecified site of right female breast: Secondary | ICD-10-CM | POA: Insufficient documentation

## 2022-10-29 DIAGNOSIS — C182 Malignant neoplasm of ascending colon: Secondary | ICD-10-CM

## 2022-10-29 DIAGNOSIS — Z79899 Other long term (current) drug therapy: Secondary | ICD-10-CM | POA: Insufficient documentation

## 2022-10-29 DIAGNOSIS — Z95828 Presence of other vascular implants and grafts: Secondary | ICD-10-CM

## 2022-10-29 DIAGNOSIS — Z79811 Long term (current) use of aromatase inhibitors: Secondary | ICD-10-CM | POA: Diagnosis not present

## 2022-10-29 DIAGNOSIS — C189 Malignant neoplasm of colon, unspecified: Secondary | ICD-10-CM

## 2022-10-29 LAB — CBC WITH DIFFERENTIAL/PLATELET
Abs Immature Granulocytes: 0.02 10*3/uL (ref 0.00–0.07)
Basophils Absolute: 0.1 10*3/uL (ref 0.0–0.1)
Basophils Relative: 1 %
Eosinophils Absolute: 0.1 10*3/uL (ref 0.0–0.5)
Eosinophils Relative: 1 %
HCT: 39.5 % (ref 36.0–46.0)
Hemoglobin: 12.6 g/dL (ref 12.0–15.0)
Immature Granulocytes: 0 %
Lymphocytes Relative: 21 %
Lymphs Abs: 1 10*3/uL (ref 0.7–4.0)
MCH: 29.4 pg (ref 26.0–34.0)
MCHC: 31.9 g/dL (ref 30.0–36.0)
MCV: 92.3 fL (ref 80.0–100.0)
Monocytes Absolute: 0.4 10*3/uL (ref 0.1–1.0)
Monocytes Relative: 8 %
Neutro Abs: 3.3 10*3/uL (ref 1.7–7.7)
Neutrophils Relative %: 69 %
Platelets: 170 10*3/uL (ref 150–400)
RBC: 4.28 MIL/uL (ref 3.87–5.11)
RDW: 13.7 % (ref 11.5–15.5)
WBC: 4.7 10*3/uL (ref 4.0–10.5)
nRBC: 0 % (ref 0.0–0.2)

## 2022-10-29 LAB — COMPREHENSIVE METABOLIC PANEL
ALT: 13 U/L (ref 0–44)
AST: 18 U/L (ref 15–41)
Albumin: 3.7 g/dL (ref 3.5–5.0)
Alkaline Phosphatase: 75 U/L (ref 38–126)
Anion gap: 8 (ref 5–15)
BUN: 26 mg/dL — ABNORMAL HIGH (ref 8–23)
CO2: 22 mmol/L (ref 22–32)
Calcium: 9.4 mg/dL (ref 8.9–10.3)
Chloride: 106 mmol/L (ref 98–111)
Creatinine, Ser: 2.3 mg/dL — ABNORMAL HIGH (ref 0.44–1.00)
GFR, Estimated: 21 mL/min — ABNORMAL LOW (ref >60)
Glucose, Bld: 98 mg/dL (ref 70–99)
Potassium: 4 mmol/L (ref 3.5–5.1)
Sodium: 136 mmol/L (ref 135–145)
Total Bilirubin: 0.8 mg/dL (ref 0.3–1.2)
Total Protein: 6.5 g/dL (ref 6.5–8.1)

## 2022-10-29 LAB — TSH: TSH: 3.853 u[IU]/mL (ref 0.350–4.500)

## 2022-10-29 LAB — MAGNESIUM: Magnesium: 2 mg/dL (ref 1.7–2.4)

## 2022-10-29 MED ORDER — HEPARIN SOD (PORK) LOCK FLUSH 100 UNIT/ML IV SOLN
500.0000 [IU] | Freq: Once | INTRAVENOUS | Status: AC | PRN
  Administered 2022-10-29: 500 [IU]

## 2022-10-29 MED ORDER — SODIUM CHLORIDE 0.9% FLUSH
10.0000 mL | INTRAVENOUS | Status: DC | PRN
  Administered 2022-10-29: 10 mL

## 2022-10-29 MED ORDER — SODIUM CHLORIDE 0.9 % IV SOLN: Freq: Once | INTRAVENOUS | Status: AC

## 2022-10-29 MED ORDER — SODIUM CHLORIDE 0.9 % IV SOLN
480.0000 mg | Freq: Once | INTRAVENOUS | Status: AC
  Administered 2022-10-29: 480 mg via INTRAVENOUS
  Filled 2022-10-29: qty 48

## 2022-10-29 NOTE — Patient Instructions (Addendum)
[place] at [facility] Discharge Instructions   You were seen and examined today by Dr. Delton Coombes.  He reviewed the results of your lab work which are normal/stable.   We will proceed with your treatment today.   We will see you back in 4 weeks. We will repeat a PET scan prior to your next office visit.    Thank you for choosing [place] at [facility] to provide your oncology and hematology care.  To afford each patient quality time with our provider, please arrive at least 15 minutes before your scheduled appointment time.   If you have a lab appointment with the [facility] please come in thru the Main Entrance and check in at the main information desk.  You need to re-schedule your appointment should you arrive 10 or more minutes late.  We strive to give you quality time with our providers, and arriving late affects you and other patients whose appointments are after yours.  Also, if you no show three or more times for appointments you may be dismissed from the clinic at the providers discretion.     Again, thank you for choosing [facility]  Our hope is that these requests will decrease the amount of time that you wait before being seen by our physicians.       _____________________________________________________________  Should you have questions after your visit to [facility], please contact our office at [phone] and follow the prompts.  Our office hours are 8:00 a.m. and 4:30 p.m. Monday - Friday.  Please note that voicemails left after 4:00 p.m. may not be returned until the following business day.  We are closed weekends and major holidays.  You do have access to a nurse 24-7, just call the main number to the clinic [phone] and do not press any options, hold on the line and a nurse will answer the phone.    For prescription refill requests, have your pharmacy contact our office and allow 72  hours.    Due to Covid, you will need to wear a mask upon entering the hospital. If you do not have a mask, a mask will be given to you at the Main Entrance upon arrival. For doctor visits, patients may have 1 support person age 67 or older with them. For treatment visits, patients can not have anyone with them due to social distancing guidelines and our immunocompromised population.

## 2022-10-29 NOTE — Progress Notes (Signed)
Patient has been examined by Dr. Katragadda, and vital signs and labs have been reviewed. ANC, Creatinine, LFTs, hemoglobin, and platelets are within treatment parameters per M.D. - pt may proceed with treatment.  Primary RN and pharmacy notified.  

## 2022-10-29 NOTE — Progress Notes (Signed)
Patient okay for treatment per Dr. Delton Coombes. Patient tolerated chemotherapy with no complaints voiced. Side effects with management reviewed understanding verbalized. Port site clean and dry with no bruising or swelling noted at site. Good blood return noted before and after administration of chemotherapy. Band aid applied. Patient left in satisfactory condition with VSS and no s/s of distress noted.

## 2022-10-29 NOTE — Progress Notes (Signed)
[place],  [id number]   CLINIC:  Medical Oncology/Hematology  PCP:  Antony Contras, MD [address] / [place] [address] [phone]   REASON FOR VISIT:  Follow-up for stage I right breast cancer and colon cancer and left kidney urothelial carcinoma  PRIOR THERAPY:  1. Robotic proximal colectomy on 10/11/2019. 2. Right breast lumpectomy and SLNB on 04/02/2020 3.  Left radical nephro ureterectomy, lymphadenectomy, bladder cuff excision on 07/13/2022  NGS Results: MSI--high, Foundation 1 not sent  CURRENT THERAPY: Opdivo every 4 weeks  BRIEF ONCOLOGIC HISTORY:  Oncology History  Cancer of ascending colon s/p robotic proximal colectomy 10/11/2019  10/11/2019 Initial Diagnosis   Cancer of ascending colon s/p robotic proximal colectomy 10/11/2019   10/23/2019 Cancer Staging   Staging form: Colon and Rectum, AJCC 8th Edition - Clinical stage from 10/23/2019: Stage IVA (cT3, cN1c, cM1a) - Signed by Derek Jack, MD on 12/14/2019   05/14/2020 - 06/26/2022 Chemotherapy   Patient is on Treatment Plan : COLORECTAL Pembrolizumab q21d     05/14/2020 - 06/26/2022 Chemotherapy   Patient is on Treatment Plan : COLORECTAL Pembrolizumab (200) q21d     08/27/2022 -  Chemotherapy   Patient is on Treatment Plan : COLORECTAL Nivolumab (480) q28d     Malignant neoplasm of right female breast ([place])  04/23/2020 Initial Diagnosis   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 ([place])   04/23/2020 Cancer Staging   Staging form: Breast, AJCC 8th Edition - Clinical stage from 04/23/2020: Stage IA (cT1c, cN0(sn), cM0, G2, ER+, PR+, HER2-) - Signed by Derek Jack, MD on 04/23/2020   Urothelial carcinoma of kidney, left ([place])  08/18/2022 Initial Diagnosis   Urothelial carcinoma of kidney, left ([place])   08/27/2022 -  Chemotherapy   Patient is on Treatment Plan : COLORECTAL Nivolumab (480) q28d       CANCER STAGING:   Cancer Staging  Cancer of ascending colon s/p robotic proximal colectomy 10/11/2019 Staging form: Colon and Rectum, AJCC 8th Edition - Clinical stage from 10/23/2019: Stage IVA (cT3, cN1c, cM1a) - Signed by Derek Jack, MD on 12/14/2019  Malignant neoplasm of right female breast [facility]) Staging form: Breast, AJCC 8th Edition - Clinical stage from 04/23/2020: Stage IA (cT1c, cN0(sn), cM0, G2, ER+, PR+, HER2-) - Signed by Derek Jack, MD on 04/23/2020  Urothelial carcinoma of kidney, left [facility]) Staging form: Kidney, AJCC 8th Edition - Clinical stage from 08/18/2022: Stage III (cT3, cN0, cM0) - Unsigned   INTERVAL HISTORY:  [name], a 81 y.o. female, seen for follow-up of urothelial cancer, colon cancer and breast cancer.  Energy levels are 40%.  Reports some dyspnea on exertion.  Also has occasional diarrhea depending on what she eats.  REVIEW OF SYSTEMS:  Review of Systems  Respiratory:  Positive for shortness of breath.   Gastrointestinal:  Positive for diarrhea.  All other systems reviewed and are negative.   PAST MEDICAL/SURGICAL HISTORY:  Past Medical History:  Diagnosis Date   Abdominal aortic aneurysm (HCC)    Acid reflux    Anemia    Arthritis    knees , R shoulder - tx /w injection - 11/2014   Basal cell carcinoma (BCC) of dorsum of nose 2010   Resolved   Cancer ([place])    basal cell on nose   Colon cancer ([place])    Coronary artery disease    Hypercholesteremia    Hypertension    Hypothyroidism  Lt Acute pyelonephritis 09/11/2018   MI, old 2000   Peripheral arterial disease ([place])    hhigh-grade ostial bilateral calcified iliac stenosis with claudication   Tobacco abuse    Vertigo    when lays on left side.   Past Surgical History:  Procedure Laterality Date   BREAST LUMPECTOMY WITH RADIOACTIVE SEED AND SENTINEL LYMPH NODE BIOPSY Bilateral 04/02/2020   Procedure: BILATERAL BREAST LUMPECTOMY WITH RADIOACTIVE SEED AND RIGHT SENTINEL LYMPH NODE  BIOPSY AND RIGHT TARGETED AXILLARY LYMPH NODE BIOPSY;  Surgeon: Erroll Luna, MD;  Location: [place];  Service: General;  Laterality: Bilateral;   CARDIAC CATHETERIZATION  5 stents   CARDIAC CATHETERIZATION N/A 01/20/2016   Procedure: Left Heart Cath and Coronary Angiography;  Surgeon: Peter M Martinique, MD;  Location: [place] [facility];  Service: Cardiovascular;  Laterality: N/A;   CATARACT EXTRACTION W/PHACO Left 05/24/2014   Procedure: CATARACT EXTRACTION PHACO AND INTRAOCULAR LENS PLACEMENT (IOC);  Surgeon: Tonny Branch, MD;  Location: [facility];  Service: Ophthalmology;  Laterality: Left;  CDE:  9.30   CATARACT EXTRACTION W/PHACO Right 06/18/2014   Procedure: CATARACT EXTRACTION PHACO AND INTRAOCULAR LENS PLACEMENT RIGHT EYE CDE=10.84;  Surgeon: Tonny Branch, MD;  Location: [facility];  Service: Ophthalmology;  Laterality: Right;   CORONARY ARTERY BYPASS GRAFT N/A 01/24/2016   Procedure: CORONARY ARTERY BYPASS GRAFTING (CABG);  Surgeon: Gaye Pollack, MD;  Location: [facility];  Service: Open Heart Surgery;  Laterality: N/A;   CORONARY STENT PLACEMENT     CORONARY STENT PLACEMENT  03/06/15   CFX DES   CYSTOSCOPY/URETEROSCOPY/HOLMIUM LASER/STENT PLACEMENT Left 09/12/2018   Procedure: CYSTOSCOPY/URETEROSCOPY/STENT PLACEMENT;  Surgeon: Ceasar Mons, MD;  Location: [facility];  Service: Urology;  Laterality: Left;   EYE SURGERY     LEFT HEART CATH AND CORS/GRAFTS ANGIOGRAPHY N/A 09/12/2019   Procedure: LEFT HEART CATH AND CORS/GRAFTS ANGIOGRAPHY;  Surgeon: Troy Sine, MD;  Location: [place] [facility];  Service: Cardiovascular;  Laterality: N/A;   LEFT HEART CATHETERIZATION WITH CORONARY ANGIOGRAM N/A 03/06/2015   Procedure: LEFT HEART CATHETERIZATION WITH CORONARY ANGIOGRAM;  Surgeon: Lorretta Harp, MD;  Location: [facility] CATH LAB;  Service: Cardiovascular;  Laterality: N/A;   PARTIAL KNEE ARTHROPLASTY Right 02/04/2015   Procedure: UNICOMPARTMENTAL KNEE;  Surgeon: Dorna Leitz, MD;   Location: [place];  Service: Orthopedics;  Laterality: Right;   PERIPHERAL VASCULAR CATHETERIZATION Bilateral 05/13/2015   Procedure: Lower Extremity Angiography;  Surgeon: Lorretta Harp, MD;  Location: [place] [facility];  Service: Cardiovascular;  Laterality: Bilateral;   PERIPHERAL VASCULAR CATHETERIZATION N/A 05/13/2015   Procedure: Abdominal Aortogram;  Surgeon: Lorretta Harp, MD;  Location: [place] [facility];  Service: Cardiovascular;  Laterality: N/A;   PERIPHERAL VASCULAR CATHETERIZATION Bilateral 06/27/2015   Procedure: Peripheral Vascular Intervention;  Surgeon: Lorretta Harp, MD;  Location: [place] [facility];  Service: Cardiovascular;  Laterality: Bilateral;  ILIACS   PERIPHERAL VASCULAR CATHETERIZATION Bilateral 06/27/2015   Procedure: Peripheral Vascular Atherectomy;  Surgeon: Lorretta Harp, MD;  Location: [place] [facility];  Service: Cardiovascular;  Laterality: Bilateral;   PORTACATH PLACEMENT Left 05/24/2020   Procedure: INSERTION PORT-A-CATH;  Surgeon: Aviva Signs, MD;  [facility];  Service: General;  Laterality: Left;   TEE WITHOUT CARDIOVERSION N/A 01/24/2016   Procedure: TRANSESOPHAGEAL ECHOCARDIOGRAM (TEE);  Surgeon: Gaye Pollack, MD;  Location: [place] [facility];  Service: Open Heart Surgery;  Laterality: N/A;   TONSILLECTOMY     age 73   TUBAL LIGATION  SOCIAL HISTORY:  Social History   Socioeconomic History   Marital status: Married    Spouse name: Tommy   Number of children: 4   Years of education: Not on file   Highest education level: Not on file  Occupational History   Occupation: retired    Comment: worked as Presenter, broadcasting for [employer]  Tobacco Use   Smoking status: Former    Packs/day: 1.50    Years: 42.00    Total pack years: 63.00    Types: Cigarettes    Quit date: 05/19/1999    Years since quitting: 23.4   Smokeless tobacco: Never  Vaping Use   Vaping Use: Never used  Substance and Sexual Activity   Alcohol use: No   Drug  use: No   Sexual activity: Yes    Birth control/protection: Surgical  Other Topics Concern   Not on file  Social History Narrative   Not on file   Social Determinants of Health   Financial Resource Strain: [place] (02/18/2021)   Overall Financial Resource Strain (CARDIA)    Difficulty of Paying Living Expenses: Somewhat hard  Food Insecurity: No Food Insecurity (02/18/2021)   Hunger Vital Sign    Worried About Running Out of Food in the Last Year: Never true    [place] in the Last Year: Never true  Transportation Needs: No Transportation Needs (02/18/2021)   PRAPARE - Hydrologist (Medical): No    Lack of Transportation (Non-Medical): No  Physical Activity: Inactive (02/18/2021)   Exercise Vital Sign    Days of Exercise per Week: 0 days    Minutes of Exercise per Session: 0 min  Stress: No Stress Concern Present (02/18/2021)   [place]    Feeling of Stress : Not at all  Social Connections: Moderately Isolated (02/18/2021)   Social Connection and Isolation Panel [NHANES]    Frequency of Communication with Friends and Family: More than three times a week    Frequency of Social Gatherings with Friends and Family: Three times a week    Attends Religious Services: Never    Active Member of Clubs or Organizations: No    Attends Archivist Meetings: Never    Marital Status: Married  Human resources officer Violence: Not At Risk (02/18/2021)   Humiliation, Afraid, Rape, and Kick questionnaire    Fear of Current or Ex-Partner: No    Emotionally Abused: No    Physically Abused: No    Sexually Abused: No    FAMILY HISTORY:  Family History  Problem Relation Age of Onset   Ovarian cancer Mother 37   Cancer Father 26       unsure of which kind, "it was in his glands"   Hypertension Maternal Grandmother    Stroke Maternal Grandfather    Hypertension Son    Brain cancer Sister     Hypertension Daughter    Heart attack Neg Hx    Colon cancer Neg Hx    Esophageal cancer Neg Hx    Inflammatory bowel disease Neg Hx    Liver disease Neg Hx    Pancreatic cancer Neg Hx    Rectal cancer Neg Hx    Stomach cancer Neg Hx     CURRENT MEDICATIONS:  Current Outpatient Medications  Medication Sig Dispense Refill   amLODipine (NORVASC) 5 MG tablet TAKE 1 TABLET BY MOUTH ONCE A DAY. 30 tablet 4   amoxicillin-clavulanate (  AUGMENTIN) 875-125 MG tablet Take 1 tablet by mouth 2 (two) times daily. 14 tablet 0   anastrozole (ARIMIDEX) 1 MG tablet TAKE 1 TABLET BY MOUTH ONCE DAILY. 30 tablet 6   aspirin EC 81 MG tablet Take 81 mg by mouth every evening.      atorvastatin (LIPITOR) 40 MG tablet TAKE (1) TABLET BY MOUTH ONCE DAILY. KEEP FUTURE APPOINTMENTS FOR ANY FURTHER REFILLS. 90 tablet 3   celecoxib (CELEBREX) 100 MG capsule Take 100 mg by mouth 2 (two) times daily.     clopidogrel (PLAVIX) 75 MG tablet Take 1 tablet (75 mg total) by mouth daily. FILLS. 90 tablet 3   esomeprazole (NEXIUM) 20 MG capsule Take 20 mg by mouth daily.      HYDROcodone-acetaminophen (NORCO/VICODIN) 5-325 MG tablet Take 1 tablet by mouth daily. 15 tablet 0   hydrOXYzine (ATARAX) 25 MG tablet TAKE (1) TABLET BY MOUTH AT BEDTIME AS NEEDED 30 tablet 0   levothyroxine (SYNTHROID) 25 MCG tablet Take 25 mcg by mouth daily.     metoprolol tartrate (LOPRESSOR) 25 MG tablet TAKE 1 TABLET BY MOUTH TWICE DAILY. 160 tablet 3   nitroGLYCERIN (NITROSTAT) 0.4 MG SL tablet Place 1 tablet (0.4 mg total) under the tongue every 5 (five) minutes as needed for chest pain. 25 tablet prn   tamsulosin (FLOMAX) 0.4 MG CAPS capsule Take 0.4 mg by mouth daily.     Vitamin D, Ergocalciferol, (DRISDOL) 1.25 MG (50000 UNIT) CAPS capsule TAKE 1 CAPSULE BY MOUTH ONCE A WEEK. 4 capsule 0   No current facility-administered medications for this visit.    ALLERGIES:  Allergies  Allergen Reactions   Crab [Shellfish Allergy] Nausea And  Vomiting    Throws up violently   Ezetimibe Diarrhea   Keflex [Cephalexin]     Caused sores   Other Nausea And Vomiting    Shrimp    PHYSICAL EXAM:  Performance status (ECOG): 1 - Symptomatic but completely ambulatory  There were no vitals filed for this visit.  Wt Readings from Last 3 Encounters:  10/29/22 149 lb 0.5 oz (67.6 kg)  09/29/22 146 lb 6.4 oz (66.4 kg)  08/27/22 146 lb 3.2 oz (66.3 kg)   Physical Exam Vitals reviewed.  Constitutional:      Appearance: Normal appearance.  Cardiovascular:     Rate and Rhythm: Normal rate and regular rhythm.     Pulses: Normal pulses.     Heart sounds: Normal heart sounds.  Pulmonary:     Effort: Pulmonary effort is normal.     Breath sounds: Normal breath sounds.  Neurological:     General: No focal deficit present.     Mental Status: She is alert and oriented to person, place, and time.  Psychiatric:        Mood and Affect: Mood normal.        Behavior: Behavior normal.     LABORATORY DATA:  I have reviewed the labs as listed.     Latest Ref Rng & Units 09/29/2022   12:06 PM 08/27/2022    8:44 AM 08/18/2022    8:22 AM  CBC  WBC 4.0 - 10.5 K/uL 4.4  5.4  5.2   Hemoglobin 12.0 - 15.0 g/dL 13.3  13.2  12.8   Hematocrit 36.0 - 46.0 % 42.2  41.1  40.0   Platelets 150 - 400 K/uL 147  168  152       Latest Ref Rng & Units 09/29/2022   12:06 PM 08/27/2022  8:44 AM 08/18/2022    8:22 AM  CMP  Glucose 70 - 99 mg/dL 115  107  111   BUN 8 - 23 mg/dL _0 Creatinine 0.44 - 1.00 mg/dL 2.64  2.41  2.45   Sodium 135 - 145 mmol/L 137  140  140   Potassium 3.5 - 5.1 mmol/L 3.5  3.6  3.5   Chloride 98 - 111 mmol/L 108  108  108   CO2 22 - 32 mmol/L _1 Calcium 8.9 - 10.3 mg/dL 9.4  9.5  9.6   Total Protein 6.5 - 8.1 g/dL 7.5  6.6  6.6   Total Bilirubin 0.3 - 1.2 mg/dL 0.7  0.8  0.4   Alkaline Phos 38 - 126 U/L 93  82  92   AST 15 - 41 U/L _2 ALT 0 - 44 U/L _3 DIAGNOSTIC IMAGING:  I  have independently reviewed the scans and discussed with the patient. No results found.   ASSESSMENT:  1.  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  Right hemicolectomy on 10/11/2019, grade 3 adenocarcinoma, negative margins, positive LVSI, 2 tumor deposits, 0/15 lymph nodes positive, PT3PN1C. 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  MLH1 hyper methylation present. -PET scan in December 2020 showed lymph node in the right axillary region.  Biopsy consistent with metastatic colon cancer. -Last CEA was 10.7 on 11/22/2019. -Right axillary lymph node excision on 04/02/2020 consistent with metastatic carcinoma from colon cancer. -Pembrolizumab started on 05/14/2020. -PET scan on 08/05/2020 shows complete resolution of lymphadenopathy.  No evidence of new areas of uptake.  Mild vague residual hypermetabolism in the right axilla without soft tissue mass. -PET scan on 12/23/2020 with no evidence of recurrence.   2.  Stage I (PT1CPN0) right Breast, Grade 2 Invasive Lobular Carcinoma: -MRI of the breast on 12/25/2019 showed 1 cm mass behind the right areola with a suspicious mass in the left breast. -Right breast lumpectomy on 04/02/2020 shows invasive lobular carcinoma, grade 2, 1.2 cm.  Resection margins are negative.  Negative LVSI.  2 sentinel lymph nodes were negative for carcinoma.  ER/PR 100% positive, HER-2 negative, Ki-67 10%.  3.  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 - Based on her renal function, she is not  a candidate for cisplatin based chemotherapy.  Nivolumab was recommended for 1 year. - PET scan (08/06/2022): No evidence of metastatic disease. - Opdivo for 1 year started on 08/27/2022   PLAN:  1.  Stage IV colon cancer to the right axillary lymph node: - PET scan on 08/06/2022: No evidence of metastatic disease.  CEA was 9.0 on 06/04/2022. - As Keytruda not approved in the adjuvant setting for high-grade urothelial carcinoma, discontinued Keytruda and started on opdivo.  Will follow-up on PET scan prior to next visit.   2.  Right breast invasive lobular carcinoma, grade 2: - Mammogram on 03/31/2022, BI-RADS Category 1. - Continue anastrozole daily.   3.  Osteoporosis: - Continue vitamin D 50,000  units weekly.   4.  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  No immunotherapy related side effects. - Reviewed labs today which showed creatinine better at 2.3.  LFTs are normal.  CBC was grossly normal. - Proceed with Opdivo today.  RTC 4 weeks.  Will plan to repeat PET scan prior to next visit.   5.  Hypothyroidism: - Continue Synthroid 25 mcg daily.  Last TSH is 5.65.   Orders placed this encounter:  No orders of the defined types were placed in this encounter.     Derek Jack, MD [place] [phone]

## 2022-10-29 NOTE — Patient Instructions (Signed)
[facility]  Discharge Instructions: Thank you for choosing [facility] to provide your oncology and hematology care.  If you have a lab appointment with the Cancer Center, please come in thru the Main Entrance and check in at the main information desk.  Wear comfortable clothing and clothing appropriate for easy access to any Portacath or PICC line.   We strive to give you quality time with your provider. You may need to reschedule your appointment if you arrive late (15 or more minutes).  Arriving late affects you and other patients whose appointments are after yours.  Also, if you miss three or more appointments without notifying the office, you may be dismissed from the clinic at the provider's discretion.      For prescription refill requests, have your pharmacy contact our office and allow 72 hours for refills to be completed.    Today you received the following chemotherapy and/or immunotherapy agents Opdivo, return as scheduled.   To help prevent nausea and vomiting after your treatment, we encourage you to take your nausea medication as directed.  BELOW ARE SYMPTOMS THAT SHOULD BE REPORTED IMMEDIATELY: *FEVER GREATER THAN 100.4 F (38 C) OR HIGHER *CHILLS OR SWEATING *NAUSEA AND VOMITING THAT IS NOT CONTROLLED WITH YOUR NAUSEA MEDICATION *UNUSUAL SHORTNESS OF BREATH *UNUSUAL BRUISING OR BLEEDING *URINARY PROBLEMS (pain or burning when urinating, or frequent urination) *BOWEL PROBLEMS (unusual diarrhea, constipation, pain near the anus) TENDERNESS IN MOUTH AND THROAT WITH OR WITHOUT PRESENCE OF ULCERS (sore throat, sores in mouth, or a toothache) UNUSUAL RASH, SWELLING OR PAIN  UNUSUAL VAGINAL DISCHARGE OR ITCHING   Items with * indicate a potential emergency and should be followed up as soon as possible or go to the Emergency Department if any problems should occur.  Please show the CHEMOTHERAPY ALERT CARD or IMMUNOTHERAPY ALERT CARD at  check-in to the Emergency Department and triage nurse.  Should you have questions after your visit or need to cancel or reschedule your appointment, please contact [facility] [phone]  and follow the prompts.  Office hours are 8:00 a.m. to 4:30 p.m. Monday - Friday. Please note that voicemails left after 4:00 p.m. may not be returned until the following business day.  We are closed weekends and major holidays. You have access to a nurse at all times for urgent questions. Please call the main number to the clinic [phone] and follow the prompts.  For any non-urgent questions, you may also contact your provider using MyChart. We now offer e-Visits for anyone 18 and older to request care online for non-urgent symptoms. For details visit [email or web address].   Also download the MyChart app! Go to the app store, search "MyChart", open the app, select [facility], and log in with your MyChart username and password.  Masks are optional in the cancer centers. If you would like for your care team to wear a mask while they are taking care of you, please let them know. You may have one support person who is at least 83 years old accompany you for your appointments.  

## 2022-10-30 ENCOUNTER — Other Ambulatory Visit: Payer: Self-pay | Admitting: Hematology

## 2022-10-30 DIAGNOSIS — C50911 Malignant neoplasm of unspecified site of right female breast: Secondary | ICD-10-CM

## 2022-11-01 ENCOUNTER — Other Ambulatory Visit: Payer: Self-pay

## 2022-11-03 ENCOUNTER — Encounter: Payer: Self-pay | Admitting: Hematology

## 2022-11-03 ENCOUNTER — Other Ambulatory Visit: Payer: Self-pay | Admitting: *Deleted

## 2022-11-03 NOTE — Telephone Encounter (Signed)
Anastrozole refill approved.  Patient is tolerating and is to continue therapy.  

## 2022-11-06 ENCOUNTER — Ambulatory Visit (HOSPITAL_COMMUNITY)
Admission: RE | Admit: 2022-11-06 | Discharge: 2022-11-06 | Disposition: A | Discharge status: Home / Self Care | Payer: Medicare Other | Source: Ambulatory Visit | Attending: Cardiovascular Disease | Admitting: Cardiovascular Disease

## 2022-11-06 ENCOUNTER — Ambulatory Visit (HOSPITAL_BASED_OUTPATIENT_CLINIC_OR_DEPARTMENT_OTHER)
Admission: RE | Admit: 2022-11-06 | Discharge: 2022-11-06 | Disposition: A | Discharge status: Home / Self Care | Payer: Medicare Other | Source: Ambulatory Visit | Attending: Cardiology | Admitting: Cardiology

## 2022-11-06 DIAGNOSIS — I739 Peripheral vascular disease, unspecified: Secondary | ICD-10-CM

## 2022-11-06 DIAGNOSIS — Z95828 Presence of other vascular implants and grafts: Secondary | ICD-10-CM

## 2022-11-18 ENCOUNTER — Other Ambulatory Visit: Payer: Self-pay

## 2022-11-19 ENCOUNTER — Other Ambulatory Visit: Payer: Self-pay

## 2022-11-19 ENCOUNTER — Encounter (HOSPITAL_COMMUNITY)
Admission: RE | Admit: 2022-11-19 | Discharge: 2022-11-19 | Disposition: A | Discharge status: Home / Self Care | Payer: Medicare Other | Source: Ambulatory Visit | Attending: Hematology | Admitting: Hematology

## 2022-11-19 ENCOUNTER — Encounter (HOSPITAL_COMMUNITY): Payer: Self-pay

## 2022-11-19 DIAGNOSIS — Z85528 Personal history of other malignant neoplasm of kidney: Secondary | ICD-10-CM | POA: Diagnosis not present

## 2022-11-19 DIAGNOSIS — C642 Malignant neoplasm of left kidney, except renal pelvis: Secondary | ICD-10-CM | POA: Diagnosis not present

## 2022-11-19 DIAGNOSIS — Z853 Personal history of malignant neoplasm of breast: Secondary | ICD-10-CM | POA: Diagnosis not present

## 2022-11-19 MED ORDER — FLUDEOXYGLUCOSE F - 18 (FDG) INJECTION
8.7000 | Freq: Once | INTRAVENOUS | Status: AC | PRN
  Administered 2022-11-19: 8.03 via INTRAVENOUS

## 2022-11-21 ENCOUNTER — Other Ambulatory Visit: Payer: Self-pay

## 2022-11-24 ENCOUNTER — Ambulatory Visit: Payer: Medicare Other

## 2022-11-24 ENCOUNTER — Ambulatory Visit: Payer: Medicare Other | Admitting: Hematology

## 2022-11-24 ENCOUNTER — Other Ambulatory Visit: Payer: Medicare Other

## 2022-11-26 ENCOUNTER — Inpatient Hospital Stay (HOSPITAL_BASED_OUTPATIENT_CLINIC_OR_DEPARTMENT_OTHER): Payer: Medicare Other | Admitting: Hematology

## 2022-11-26 ENCOUNTER — Inpatient Hospital Stay: Payer: Medicare Other

## 2022-11-26 ENCOUNTER — Inpatient Hospital Stay: Payer: Medicare Other | Attending: Hematology

## 2022-11-26 VITALS — BP 140/60 | HR 55 | Temp 97.4°F | Resp 18 | Wt 148.1 lb

## 2022-11-26 DIAGNOSIS — C182 Malignant neoplasm of ascending colon: Secondary | ICD-10-CM | POA: Insufficient documentation

## 2022-11-26 DIAGNOSIS — I10 Essential (primary) hypertension: Secondary | ICD-10-CM | POA: Insufficient documentation

## 2022-11-26 DIAGNOSIS — Z170 Estrogen receptor positive status [ER+]: Secondary | ICD-10-CM | POA: Insufficient documentation

## 2022-11-26 DIAGNOSIS — Z5112 Encounter for antineoplastic immunotherapy: Secondary | ICD-10-CM | POA: Diagnosis not present

## 2022-11-26 DIAGNOSIS — C189 Malignant neoplasm of colon, unspecified: Secondary | ICD-10-CM

## 2022-11-26 DIAGNOSIS — Z79811 Long term (current) use of aromatase inhibitors: Secondary | ICD-10-CM | POA: Insufficient documentation

## 2022-11-26 DIAGNOSIS — C50911 Malignant neoplasm of unspecified site of right female breast: Secondary | ICD-10-CM | POA: Insufficient documentation

## 2022-11-26 DIAGNOSIS — C642 Malignant neoplasm of left kidney, except renal pelvis: Secondary | ICD-10-CM | POA: Diagnosis not present

## 2022-11-26 DIAGNOSIS — M810 Age-related osteoporosis without current pathological fracture: Secondary | ICD-10-CM | POA: Insufficient documentation

## 2022-11-26 DIAGNOSIS — E039 Hypothyroidism, unspecified: Secondary | ICD-10-CM | POA: Diagnosis not present

## 2022-11-26 DIAGNOSIS — Z79899 Other long term (current) drug therapy: Secondary | ICD-10-CM | POA: Insufficient documentation

## 2022-11-26 DIAGNOSIS — Z95828 Presence of other vascular implants and grafts: Secondary | ICD-10-CM

## 2022-11-26 LAB — CBC WITH DIFFERENTIAL/PLATELET
Abs Immature Granulocytes: 0.02 10*3/uL (ref 0.00–0.07)
Basophils Absolute: 0.1 10*3/uL (ref 0.0–0.1)
Basophils Relative: 1 %
Eosinophils Absolute: 0 10*3/uL (ref 0.0–0.5)
Eosinophils Relative: 0 %
HCT: 41 % (ref 36.0–46.0)
Hemoglobin: 13.1 g/dL (ref 12.0–15.0)
Immature Granulocytes: 0 %
Lymphocytes Relative: 15 %
Lymphs Abs: 0.9 10*3/uL (ref 0.7–4.0)
MCH: 29.2 pg (ref 26.0–34.0)
MCHC: 32 g/dL (ref 30.0–36.0)
MCV: 91.5 fL (ref 80.0–100.0)
Monocytes Absolute: 0.4 10*3/uL (ref 0.1–1.0)
Monocytes Relative: 7 %
Neutro Abs: 4.5 10*3/uL (ref 1.7–7.7)
Neutrophils Relative %: 77 %
Platelets: 174 10*3/uL (ref 150–400)
RBC: 4.48 MIL/uL (ref 3.87–5.11)
RDW: 14 % (ref 11.5–15.5)
WBC: 5.9 10*3/uL (ref 4.0–10.5)
nRBC: 0 % (ref 0.0–0.2)

## 2022-11-26 LAB — COMPREHENSIVE METABOLIC PANEL
ALT: 11 U/L (ref 0–44)
AST: 20 U/L (ref 15–41)
Albumin: 3.9 g/dL (ref 3.5–5.0)
Alkaline Phosphatase: 82 U/L (ref 38–126)
Anion gap: 8 (ref 5–15)
BUN: 27 mg/dL — ABNORMAL HIGH (ref 8–23)
CO2: 23 mmol/L (ref 22–32)
Calcium: 9.4 mg/dL (ref 8.9–10.3)
Chloride: 106 mmol/L (ref 98–111)
Creatinine, Ser: 2.4 mg/dL — ABNORMAL HIGH (ref 0.44–1.00)
GFR, Estimated: 20 mL/min — ABNORMAL LOW (ref >60)
Glucose, Bld: 97 mg/dL (ref 70–99)
Potassium: 3.7 mmol/L (ref 3.5–5.1)
Sodium: 137 mmol/L (ref 135–145)
Total Bilirubin: 0.9 mg/dL (ref 0.3–1.2)
Total Protein: 6.8 g/dL (ref 6.5–8.1)

## 2022-11-26 LAB — TSH: TSH: 5.614 u[IU]/mL — ABNORMAL HIGH (ref 0.350–4.500)

## 2022-11-26 MED ORDER — SODIUM CHLORIDE 0.9 % IV SOLN: Freq: Once | INTRAVENOUS | Status: AC

## 2022-11-26 MED ORDER — SODIUM CHLORIDE 0.9 % IV SOLN
480.0000 mg | Freq: Once | INTRAVENOUS | Status: AC
  Administered 2022-11-26: 480 mg via INTRAVENOUS
  Filled 2022-11-26: qty 48

## 2022-11-26 MED ORDER — SODIUM CHLORIDE 0.9% FLUSH
10.0000 mL | INTRAVENOUS | Status: AC | PRN
  Administered 2022-11-26: 10 mL

## 2022-11-26 MED ORDER — SODIUM CHLORIDE 0.9% FLUSH
10.0000 mL | INTRAVENOUS | Status: AC
  Administered 2022-11-26: 10 mL

## 2022-11-26 MED ORDER — HEPARIN SOD (PORK) LOCK FLUSH 100 UNIT/ML IV SOLN
500.0000 [IU] | Freq: Once | INTRAVENOUS | Status: AC | PRN
  Administered 2022-11-26: 500 [IU]

## 2022-11-26 NOTE — Progress Notes (Signed)
CMET reviewed with MD, Creatinine 2.40 today, this is patinet baseline per MD. Proceed as planned.

## 2022-11-26 NOTE — Progress Notes (Signed)
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 Robotic proximal colectomy on 10/11/2019. 2. Right breast lumpectomy and SLNB on 04/02/2020 3.  Left radical nephro ureterectomy, lymphadenectomy, bladder cuff excision on 07/13/2022  NGS Results: MSI--high, Foundation 1 not sent  CURRENT THERAPY: Opdivo every 4 weeks  BRIEF ONCOLOGIC HISTORY:  Oncology History  Cancer of ascending colon s/p robotic proximal colectomy 10/11/2019  10/11/2019 Initial Diagnosis   Cancer of ascending colon s/p robotic proximal colectomy 10/11/2019   10/23/2019 Cancer Staging   Staging form: Colon and Rectum, AJCC 8th Edition - Clinical stage from 10/23/2019: Stage IVA (cT3, cN1c, cM1a) - Signed by Derek Jack, MD on 12/14/2019   05/14/2020 - 06/26/2022 Chemotherapy   Patient is on Treatment Plan : COLORECTAL Pembrolizumab q21d     05/14/2020 - 06/26/2022 Chemotherapy   Patient is on Treatment Plan : COLORECTAL Pembrolizumab (200) q21d     08/27/2022 -  Chemotherapy   Patient is on Treatment Plan : COLORECTAL Nivolumab (480) q28d     Malignant neoplasm of right female breast ([place])  04/23/2020 Initial Diagnosis   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 ([place])   04/23/2020 Cancer Staging   Staging form: Breast, AJCC 8th Edition - Clinical stage from 04/23/2020: Stage IA (cT1c, cN0(sn), cM0, G2, ER+, PR+, HER2-) - Signed by Derek Jack, MD on 04/23/2020   Urothelial carcinoma of kidney, left ([place])  08/18/2022 Initial Diagnosis   Urothelial carcinoma of kidney, left ([place])   08/27/2022 -  Chemotherapy   Patient is on Treatment Plan : COLORECTAL Nivolumab (480) q28d       CANCER STAGING:   Cancer Staging  Cancer of ascending colon s/p robotic proximal colectomy 10/11/2019 Staging form: Colon and Rectum, AJCC 8th Edition - Clinical stage from 10/23/2019: Stage IVA (cT3, cN1c, cM1a) - Signed by Derek Jack, MD on 12/14/2019  Malignant neoplasm of right female breast [facility]) Staging form: Breast, AJCC 8th Edition - Clinical stage from 04/23/2020: Stage IA (cT1c, cN0(sn), cM0, G2, ER+, PR+, HER2-) - Signed by Derek Jack, MD on 04/23/2020  Urothelial carcinoma of kidney, left [facility]) Staging form: Kidney, AJCC 8th Edition - Clinical stage from 08/18/2022: Stage III (cT3, cN0, cM0) - Unsigned   INTERVAL HISTORY:  Jessica Taylor, a 82 y.o. female, seen for follow-up of urothelial cancer, colon cancer and breast cancer.  Energy levels are 75%.  Denies any diarrhea or skin rashes.  She reports dizziness when she bends forward and happened couple of times since last visit.  REVIEW OF SYSTEMS:  Review of Systems  Neurological:  Positive for dizziness.  All other systems reviewed and are negative.   PAST MEDICAL/SURGICAL HISTORY:  Past Medical History:  Diagnosis Date   Abdominal aortic aneurysm (HCC)    Acid reflux    Anemia    Arthritis    knees , R shoulder - tx /w injection - 11/2014   Basal cell carcinoma (BCC) of dorsum of nose 2010   Resolved   Cancer ([place])    basal cell on nose   Colon cancer ([place])    Coronary artery disease    Hypercholesteremia    Hypertension    Hypothyroidism  Lt Acute pyelonephritis 09/11/2018   MI, old 2000   Peripheral arterial disease ([place])    hhigh-grade ostial bilateral calcified iliac stenosis with claudication   Tobacco abuse    Vertigo    when lays on left side.   Past Surgical History:  Procedure Laterality Date   BREAST LUMPECTOMY WITH RADIOACTIVE SEED AND SENTINEL LYMPH NODE BIOPSY Bilateral 04/02/2020   Procedure: BILATERAL BREAST LUMPECTOMY WITH RADIOACTIVE SEED AND RIGHT SENTINEL LYMPH NODE BIOPSY AND RIGHT  TARGETED AXILLARY LYMPH NODE BIOPSY;  Surgeon: Erroll Luna, MD;  Location: [facility];  Service: General;  Laterality: Bilateral;   CARDIAC CATHETERIZATION  5 stents   CARDIAC CATHETERIZATION N/A 01/20/2016   Procedure: Left Heart Cath and Coronary Angiography;  Surgeon: Peter M Martinique, MD;  Location: [facility];  Service: Cardiovascular;  Laterality: N/A;   CATARACT EXTRACTION W/PHACO Left 05/24/2014   Procedure: CATARACT EXTRACTION PHACO AND INTRAOCULAR LENS PLACEMENT (IOC);  Surgeon: Tonny Branch, MD;  Location: [facility];  Service: Ophthalmology;  Laterality: Left;  CDE:  9.30   CATARACT EXTRACTION W/PHACO Right 06/18/2014   Procedure: CATARACT EXTRACTION PHACO AND INTRAOCULAR LENS PLACEMENT RIGHT EYE CDE=10.84;  Surgeon: Tonny Branch, MD;  Location: [facility];  Service: Ophthalmology;  Laterality: Right;   CORONARY ARTERY BYPASS GRAFT N/A 01/24/2016   Procedure: CORONARY ARTERY BYPASS GRAFTING (CABG);  Surgeon: Gaye Pollack, MD;  Location: [facility];  Service: Open Heart Surgery;  Laterality: N/A;   CORONARY STENT PLACEMENT     CORONARY STENT PLACEMENT  03/06/15   CFX DES   CYSTOSCOPY/URETEROSCOPY/HOLMIUM LASER/STENT PLACEMENT Left 09/12/2018   Procedure: CYSTOSCOPY/URETEROSCOPY/STENT PLACEMENT;  Surgeon: Ceasar Mons, MD;  Location: [facility];  Service: Urology;  Laterality: Left;   EYE SURGERY     LEFT HEART CATH AND CORS/GRAFTS ANGIOGRAPHY N/A 09/12/2019   Procedure: LEFT HEART CATH AND CORS/GRAFTS ANGIOGRAPHY;  Surgeon: Troy Sine, MD;  Location: [facility];  Service: Cardiovascular;  Laterality: N/A;   LEFT HEART CATHETERIZATION WITH CORONARY ANGIOGRAM N/A 03/06/2015   Procedure: LEFT HEART CATHETERIZATION WITH CORONARY ANGIOGRAM;  Surgeon: Lorretta Harp, MD;  Location: [facility];  Service: Cardiovascular;  Laterality: N/A;   PARTIAL KNEE ARTHROPLASTY Right 02/04/2015   Procedure: UNICOMPARTMENTAL KNEE;  Surgeon: Dorna Leitz, MD;  Location: [facility];   Service: Orthopedics;  Laterality: Right;   PERIPHERAL VASCULAR CATHETERIZATION Bilateral 05/13/2015   Procedure: Lower Extremity Angiography;  Surgeon: Lorretta Harp, MD;  Location: [facility];  Service: Cardiovascular;  Laterality: Bilateral;   PERIPHERAL VASCULAR CATHETERIZATION N/A 05/13/2015   Procedure: Abdominal Aortogram;  Surgeon: Lorretta Harp, MD;  Location: [facility];  Service: Cardiovascular;  Laterality: N/A;   PERIPHERAL VASCULAR CATHETERIZATION Bilateral 06/27/2015   Procedure: Peripheral Vascular Intervention;  Surgeon: Lorretta Harp, MD;  Location: [facility];  Service: Cardiovascular;  Laterality: Bilateral;  ILIACS   PERIPHERAL VASCULAR CATHETERIZATION Bilateral 06/27/2015   Procedure: Peripheral Vascular Atherectomy;  Surgeon: Lorretta Harp, MD;  Location: [facility];  Service: Cardiovascular;  Laterality: Bilateral;   PORTACATH PLACEMENT Left 05/24/2020   Procedure: INSERTION PORT-A-CATH;  Surgeon: Aviva Signs, MD;  Location[facility];  Service: General;  Laterality: Left;   TEE WITHOUT CARDIOVERSION N/A 01/24/2016   Procedure: TRANSESOPHAGEAL ECHOCARDIOGRAM (TEE);  Surgeon: Gaye Pollack, MD;  Location: [facility];  Service: Open Heart Surgery;  Laterality: N/A;   TONSILLECTOMY     age 51   TUBAL LIGATION  SOCIAL HISTORY:  Social History   Socioeconomic History   Marital status: Married    Spouse name: Jessica Taylor   Number of children: 4   Years of education: Not on file   Highest education level: Not on file  Occupational History   Occupation: retired    Comment: worked as Presenter, broadcasting for [employer]  Tobacco Use   Smoking status: Former    Packs/day: 1.50    Years: 42.00    Total pack years: 63.00    Types: Cigarettes    Quit date: 05/19/1999    Years since quitting: 23.5   Smokeless tobacco: Never  Vaping Use   Vaping Use: Never used  Substance and Sexual Activity   Alcohol use: No   Drug use: No   Sexual  activity: Yes    Birth control/protection: Surgical  Other Topics Concern   Not on file  Social History Narrative   Not on file   Social Determinants of Health   Financial Resource Strain: [place] (02/18/2021)   Overall Financial Resource Strain (CARDIA)    Difficulty of Paying Living Expenses: Somewhat hard  Food Insecurity: No Food Insecurity (02/18/2021)   Hunger Vital Sign    Worried About Running Out of Food in the Last Year: Never true    [place] in the Last Year: Never true  Transportation Needs: No Transportation Needs (02/18/2021)   PRAPARE - Hydrologist (Medical): No    Lack of Transportation (Non-Medical): No  Physical Activity: Inactive (02/18/2021)   Exercise Vital Sign    Days of Exercise per Week: 0 days    Minutes of Exercise per Session: 0 min  Stress: No Stress Concern Present (02/18/2021)   [place]    Feeling of Stress : Not at all  Social Connections: Moderately Isolated (02/18/2021)   Social Connection and Isolation Panel [NHANES]    Frequency of Communication with Friends and Family: More than three times a week    Frequency of Social Gatherings with Friends and Family: Three times a week    Attends Religious Services: Never    Active Member of Clubs or Organizations: No    Attends Archivist Meetings: Never    Marital Status: Married  Human resources officer Violence: Not At Risk (02/18/2021)   Humiliation, Afraid, Rape, and Kick questionnaire    Fear of Current or Ex-Partner: No    Emotionally Abused: No    Physically Abused: No    Sexually Abused: No    FAMILY HISTORY:  Family History  Problem Relation Age of Onset   Ovarian cancer Mother 24   Cancer Father 52       unsure of which kind, "it was in his glands"   Hypertension Maternal Grandmother    Stroke Maternal Grandfather    Hypertension Son    Brain cancer Sister    Hypertension  Daughter    Heart attack Neg Hx    Colon cancer Neg Hx    Esophageal cancer Neg Hx    Inflammatory bowel disease Neg Hx    Liver disease Neg Hx    Pancreatic cancer Neg Hx    Rectal cancer Neg Hx    Stomach cancer Neg Hx     CURRENT MEDICATIONS:  Current Outpatient Medications  Medication Sig Dispense Refill   amLODipine (NORVASC) 5 MG tablet TAKE 1 TABLET BY MOUTH ONCE A DAY. 30 tablet 4   anastrozole (  ARIMIDEX) 1 MG tablet TAKE 1 TABLET BY MOUTH ONCE DAILY. 30 tablet 0   aspirin EC 81 MG tablet Take 81 mg by mouth every evening.      atorvastatin (LIPITOR) 40 MG tablet TAKE (1) TABLET BY MOUTH ONCE DAILY. KEEP FUTURE APPOINTMENTS FOR ANY FURTHER REFILLS. 90 tablet 3   celecoxib (CELEBREX) 100 MG capsule Take 100 mg by mouth 2 (two) times daily.     clopidogrel (PLAVIX) 75 MG tablet Take 1 tablet (75 mg total) by mouth daily. FILLS. 90 tablet 3   esomeprazole (NEXIUM) 20 MG capsule Take 20 mg by mouth daily.      HYDROcodone-acetaminophen (NORCO/VICODIN) 5-325 MG tablet Take 1 tablet by mouth daily. 15 tablet 0   hydrOXYzine (ATARAX) 25 MG tablet TAKE (1) TABLET BY MOUTH AT BEDTIME AS NEEDED 30 tablet 0   levothyroxine (SYNTHROID) 25 MCG tablet Take 25 mcg by mouth daily.     metoprolol tartrate (LOPRESSOR) 25 MG tablet TAKE 1 TABLET BY MOUTH TWICE DAILY. 160 tablet 3   tamsulosin (FLOMAX) 0.4 MG CAPS capsule Take 0.4 mg by mouth daily.     Vitamin D, Ergocalciferol, (DRISDOL) 1.25 MG (50000 UNIT) CAPS capsule TAKE 1 CAPSULE BY MOUTH ONCE A WEEK. 4 capsule 0   nitroGLYCERIN (NITROSTAT) 0.4 MG SL tablet Place 1 tablet (0.4 mg total) under the tongue every 5 (five) minutes as needed for chest pain. (Patient not taking: Reported on 11/26/2022) 25 tablet prn   No current facility-administered medications for this visit.    ALLERGIES:  Allergies  Allergen Reactions   Crab [Shellfish Allergy] Nausea And Vomiting    Throws up violently   Ezetimibe Diarrhea   Keflex [Cephalexin]      Caused sores   Other Nausea And Vomiting    Shrimp    PHYSICAL EXAM:  Performance status (ECOG): 1 - Symptomatic but completely ambulatory  There were no vitals filed for this visit.  Wt Readings from Last 3 Encounters:  10/29/22 149 lb 0.5 oz (67.6 kg)  09/29/22 146 lb 6.4 oz (66.4 kg)  08/27/22 146 lb 3.2 oz (66.3 kg)   Physical Exam Vitals reviewed.  Constitutional:      Appearance: Normal appearance.  Cardiovascular:     Rate and Rhythm: Normal rate and regular rhythm.     Pulses: Normal pulses.     Heart sounds: Normal heart sounds.  Pulmonary:     Effort: Pulmonary effort is normal.     Breath sounds: Normal breath sounds.  Neurological:     General: No focal deficit present.     Mental Status: She is alert and oriented to person, place, and time.  Psychiatric:        Mood and Affect: Mood normal.        Behavior: Behavior normal.    LABORATORY DATA:  I have reviewed the labs as listed.     Latest Ref Rng & Units 10/29/2022   12:43 PM 09/29/2022   12:06 PM 08/27/2022    8:44 AM  CBC  WBC 4.0 - 10.5 K/uL 4.7  4.4  5.4   Hemoglobin 12.0 - 15.0 g/dL 12.6  13.3  13.2   Hematocrit 36.0 - 46.0 % 39.5  42.2  41.1   Platelets 150 - 400 K/uL 170  147  168       Latest Ref Rng & Units 10/29/2022   12:43 PM 09/29/2022   12:06 PM 08/27/2022    8:44 AM  CMP  Glucose 70 -  99 mg/dL 98  115  107   BUN 8 - 23 mg/dL _0 Creatinine 0.44 - 1.00 mg/dL 2.30  2.64  2.41   Sodium 135 - 145 mmol/L 136  137  140   Potassium 3.5 - 5.1 mmol/L 4.0  3.5  3.6   Chloride 98 - 111 mmol/L 106  108  108   CO2 22 - 32 mmol/L _1 Calcium 8.9 - 10.3 mg/dL 9.4  9.4  9.5   Total Protein 6.5 - 8.1 g/dL 6.5  7.5  6.6   Total Bilirubin 0.3 - 1.2 mg/dL 0.8  0.7  0.8   Alkaline Phos 38 - 126 U/L 75  93  82   AST 15 - 41 U/L _2 ALT 0 - 44 U/L _3 DIAGNOSTIC IMAGING:  I have independently reviewed the scans and discussed with the patient. NM PET Image  Restag (PS) Skull Base To Thigh  Result Date: 11/22/2022 CLINICAL DATA:  Subsequent treatment strategy for bladder carcinoma. Additional history of LEFT breast carcinoma in renal cell carcinoma. * Tracking Code: BO * . EXAM: NUCLEAR MEDICINE PET SKULL BASE TO THIGH TECHNIQUE: 8.0 mCi F-18 FDG was injected intravenously. Full-ring PET imaging was performed from the skull base to thigh after the radiotracer. CT data was obtained and used for attenuation correction and anatomic localization. Fasting blood glucose: 97 mg/dl COMPARISON:  FDG PET scan 08/06/2022 FINDINGS: Mediastinal blood pool activity: SUV max 2.1 Liver activity: SUV max NA NECK: No hypermetabolic lymph nodes in the neck. Incidental CT findings: None. CHEST: No hypermetabolic mediastinal or hilar nodes. No suspicious pulmonary nodules on the CT scan. Soft tissue in the RIGHT breast adjacent to surgical clips has mild metabolic activity (SUV max equal 3.5) which compares to SUV max equal 3.4 on most recent PET-CT scan Incidental CT findings: Port in the anterior chest wall with tip in distal SVC. Post CABG anatomy. ABDOMEN/PELVIS: No abnormal hypermetabolic activity within the liver, pancreas, adrenal glands, or spleen. No hypermetabolic lymph nodes in the abdomen or pelvis. Incidental CT findings: No bladder abnormality identified on noncontrast exam. Extensive LEFT colon diverticulosis. Atherosclerotic calcification of the aorta. Post LEFT nephrectomy SKELETON: No focal hypermetabolic activity to suggest skeletal metastasis. Incidental CT findings: None. IMPRESSION: 1. No evidence of bladder cancer metastasis. 2. Hypermetabolic soft tissue thickening in the RIGHT breast at surgical site is not changed from comparison exam and favored post therapy inflammation. 3. No evidence of breast cancer metastasis. 4. Post LEFT nephrectomy without evidence of local recurrence. Electronically Signed   By: Suzy Bouchard M.D.   On: 11/22/2022 10:38   VAS [place]  ABI WITH/WO TBI  Result Date: 11/07/2022  LOWER EXTREMITY DOPPLER STUDY Patient Name:  Jessica Taylor  Date of Exam:   11/06/2022 Medical Rec #: [id number]       Accession #:    [id number] Date of Birth: 06-10-1941       Patient Gender: F Patient Age:   65 years Exam Location:  [facility] Procedure:      VAS [place] ABI WITH/WO TBI Referring Phys: MARK SKAINS --------------------------------------------------------------------------------  Indications: Peripheral artery disease. History of common iliac stents. Patient              denies any leg symtpoms at this time. High Risk Factors: Hypertension, hyperlipidemia, past history of smoking, prior  MI, coronary artery disease.  Vascular Interventions: History of bilateral common iliac stents in 2016. Comparison Study: Previous ABIs 11/03/21 were 0.99 on the right and 0.97 on the                   left. Performing Technologist: Mariane Masters RVT  Examination Guidelines: A complete evaluation includes at minimum, Doppler waveform signals and systolic blood pressure reading at the level of bilateral brachial, anterior tibial, and posterior tibial arteries, when vessel segments are accessible. Bilateral testing is considered an integral part of a complete examination. Photoelectric Plethysmograph (PPG) waveforms and toe systolic pressure readings are included as required and additional duplex testing as needed. Limited examinations for reoccurring indications may be performed as noted.  ABI Findings: +---------+------------------+-----+-----------+--------+ Right    Rt Pressure (mmHg)IndexWaveform   Comment  +---------+------------------+-----+-----------+--------+ Brachial 154                                        +---------+------------------+-----+-----------+--------+ PTA      150               0.93 multiphasic         +---------+------------------+-----+-----------+--------+ PERO     156               0.96 biphasic             +---------+------------------+-----+-----------+--------+ DP       144               0.89 biphasic            +---------+------------------+-----+-----------+--------+ Great Toe119               0.73 Normal              +---------+------------------+-----+-----------+--------+ +---------+------------------+-----+----------+-------+ Left     Lt Pressure (mmHg)IndexWaveform  Comment +---------+------------------+-----+----------+-------+ Brachial 162                                      +---------+------------------+-----+----------+-------+ PTA      141               0.87 biphasic          +---------+------------------+-----+----------+-------+ PERO     142               0.92 monophasic        +---------+------------------+-----+----------+-------+ DP       128               0.79 biphasic          +---------+------------------+-----+----------+-------+ Great Toe109               0.67 Abnormal          +---------+------------------+-----+----------+-------+ +-------+-----------+-----------+------------+------------+ ABI/TBIToday's ABIToday's TBIPrevious ABIPrevious TBI +-------+-----------+-----------+------------+------------+ Right  0.96       0.73       0.99        0.72         +-------+-----------+-----------+------------+------------+ Left   0.88       0.67       0.97        0.67         +-------+-----------+-----------+------------+------------+  Right ABIs appear essentially unchanged compared to prior study on 11/03/21. Left ABIs appear mildly decreased compared to prior study on 11/03/21.  Summary: Right: Resting  right ankle-brachial index is within normal range. The right toe-brachial index is normal. Left: Resting left ankle-brachial index indicates mild left lower extremity arterial disease. The left toe-brachial index is abnormal. *See table(s) above for measurements and observations. See aorta-iliac duplex report.  Suggest follow up study  in 12 months. Electronically signed by Quay Burow MD on 11/07/2022 at 67:49:22 AM.    Final    VAS US AORTA/IVC/ILIACS  Result Date: 11/07/2022 ABDOMINAL AORTA STUDY Patient Name:  Jessica Taylor  Date of Exam:   11/06/2022 Medical Rec #: [id number]       Accession #:    [id number] Date of Birth: [birth year]       Patient Gender: F Patient Age:   14 years Exam Location:  [facility] Procedure:      VAS US AORTA/IVC/ILIACS Referring Phys: Candee Furbish --------------------------------------------------------------------------------  Indications: History of bilateral common iliac artery stenting. Patient denies              any claudication symptoms or rest pain at this time. Risk Factors: Hypertension, hyperlipidemia, past history of smoking, prior MI,               coronary artery disease. Vascular Interventions: 06/2015- bilateral common iliac artery stents. Limitations: Air/bowel gas.  Comparison Study: Previous study 11/03/21 showed >50% stenosis in the bilateral                   common and right external iliac arteries. Performing Technologist: Mariane Masters RVT  Examination Guidelines: A complete evaluation includes B-mode imaging, spectral Doppler, color Doppler, and power Doppler as needed of all accessible portions of each vessel. Bilateral testing is considered an integral part of a complete examination. Limited examinations for reoccurring indications may be performed as noted.  Abdominal Aorta Findings: +-------------+-------+----------+----------+--------+--------+-------------+ Location     AP (cm)Trans (cm)PSV (cm/s)WaveformThrombusComments      +-------------+-------+----------+----------+--------+--------+-------------+ Proximal     2.20   2.20      71        biphasic                      +-------------+-------+----------+----------+--------+--------+-------------+ Mid          3.20   3.10      96        biphasic                       +-------------+-------+----------+----------+--------+--------+-------------+ Distal       2.20   2.10      126       biphasic                      +-------------+-------+----------+----------+--------+--------+-------------+ RT CIA Prox  1.4    1.2       155       biphasic                      +-------------+-------+----------+----------+--------+--------+-------------+ RT CIA Mid                    270       biphasic                      +-------------+-------+----------+----------+--------+--------+-------------+ RT CIA Distal                 348       biphasic        >50% stenosis +-------------+-------+----------+----------+--------+--------+-------------+ RT  EIA Prox                   96        biphasic                      +-------------+-------+----------+----------+--------+--------+-------------+ RT EIA Mid                    99        biphasic                      +-------------+-------+----------+----------+--------+--------+-------------+ RT EIA Distal                 118       biphasic                      +-------------+-------+----------+----------+--------+--------+-------------+ LT CIA Prox  1.0    0.9       186       biphasic                      +-------------+-------+----------+----------+--------+--------+-------------+ LT CIA Mid                    199       biphasic                      +-------------+-------+----------+----------+--------+--------+-------------+ LT CIA Distal                 225       biphasic        >50% stenosis +-------------+-------+----------+----------+--------+--------+-------------+ LT EIA Prox                   157       biphasic                      +-------------+-------+----------+----------+--------+--------+-------------+ LT EIA Mid                    163       biphasic                      +-------------+-------+----------+----------+--------+--------+-------------+  LT EIA Distal                 171       biphasic                      +-------------+-------+----------+----------+--------+--------+-------------+ IVC/Iliac Findings: +--------+------+--------+--------+   IVC   PatentThrombusComments +--------+------+--------+--------+ IVC Proxpatent                 +--------+------+--------+--------+    Summary: Abdominal Aorta: There is evidence of abnormal dilatation of the mid Abdominal aorta. The largest aortic measurement is 3.2 cm. Stenosis: +------------------+---------------+------------------+ Location          Stent          Comments           +------------------+---------------+------------------+ Right Common Iliac50-99% stenosisessentially stable +------------------+---------------+------------------+ Left Common Iliac 50-99% stenosisessentially stable +------------------+---------------+------------------+ Aorta-iliac atherosclerosis. Infrarenal AAA noted measuring 3.2 x 3.0 cm. Essentially stable >50% stenosis of the bilateral common iliac arteries, s/p stenting. IVC/Iliac: There is no evidence of thrombus involving the IVC.  *See table(s) above for measurements and observations. Suggest follow up study in 12 months.  Electronically signed by Quay Burow MD on 11/07/2022 at 7:47:53 AM.  Final      ASSESSMENT:  1.  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  Right hemicolectomy on 10/11/2019, grade 3 adenocarcinoma, negative margins, positive LVSI, 2 tumor deposits, 0/15 lymph nodes positive, PT3PN1C. 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  MLH1 hyper methylation present. -PET scan in December 2020 showed lymph node in the right axillary region.  Biopsy consistent with metastatic colon cancer. -Last CEA was 10.7 on 11/22/2019. -Right axillary lymph node excision on 04/02/2020 consistent with metastatic carcinoma from colon  cancer. -Pembrolizumab started on 05/14/2020. -PET scan on 08/05/2020 shows complete resolution of lymphadenopathy.  No evidence of new areas of uptake.  Mild vague residual hypermetabolism in the right axilla without soft tissue mass. -PET scan on 12/23/2020 with no evidence of recurrence. Beryle Flock held since we started opdivo on 08/27/2022   2.  Stage I (PT1CPN0) right Breast, Grade 2 Invasive Lobular Carcinoma: -MRI of the breast on 12/25/2019 showed 1 cm mass behind the right areola with a suspicious mass in the left breast. -Right breast lumpectomy on 04/02/2020 shows invasive lobular carcinoma, grade 2, 1.2 cm.  Resection margins are negative.  Negative LVSI.  2 sentinel lymph nodes were negative for carcinoma.  ER/PR 100% positive, HER-2 negative, Ki-67 10%.  3.  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 - Based on her renal function, she is not a candidate for cisplatin based chemotherapy.  Nivolumab was recommended for 1 year. - PET scan (08/06/2022): No evidence of metastatic disease. - Opdivo for 1 year started on 08/27/2022   PLAN:  1.  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  Will continue to monitor.   2.  Right breast invasive lobular carcinoma, grade 2: - Last mammogram on 03/31/2022, BI-RADS Category 1. - PET scan showed hypermetabolic soft tissue thickening in the right breast at surgical site which is not changed from previous PET scan from September.  This was not seen on mammogram. - Continue anastrozole  daily.   3.  Osteoporosis: - Continue vitamin D 50,000 units weekly.   4.  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  No immunotherapy related side effects. - PET scan (11/19/2022): No evidence of metastatic disease. - Reviewed labs today which showed normal LFTs and CBC.  Creatinine is 2.4 and at new baseline since nephrectomy. - She reported dizziness while bending forward x 2 since last treatment.  Will keep a close eye on it.  She may proceed with treatment today.  RTC 4 weeks for follow-up.   5.  Hypothyroidism: - TSH today is 5.6.  The previous TSH was 3.8 on 10/29/2022.  Continue Synthroid 50 mcg daily.   Orders placed this encounter:  No orders of the defined types were placed in this encounter.     Derek Jack, MD [facility] [phone]

## 2022-11-26 NOTE — Progress Notes (Signed)
Patient presents today for Opdivo infusion.  Patient is in satisfactory condition with no complaints voiced.  Vital signs are stable.  Labs reviewed by Dr. Delton Coombes during her office visit.  Creatinine today is 2.4.  All other labs are within treatment parameters.  We will proceed with treatment per MD orders.   Patient tolerated treatment well with no complaints voiced.  Patient left ambulatory in stable condition.  Vital signs stable at discharge.  Follow up as scheduled.

## 2022-11-26 NOTE — Patient Instructions (Addendum)
[facility]  Discharge Instructions  You were seen and examined today by Dr. Delton Coombes.  Your PET scan is stable. Your labs are stable.  Proceed with treatment today as planned.  Follow-up as scheduled.  Thank you for choosing [facility] to provide your oncology and hematology care.   To afford each patient quality time with our provider, please arrive at least 15 minutes before your scheduled appointment time. You may need to reschedule your appointment if you arrive late (10 or more minutes). Arriving late affects you and other patients whose appointments are after yours.  Also, if you miss three or more appointments without notifying the office, you may be dismissed from the clinic at the provider's discretion.    Again, thank you for choosing [facility].  Our hope is that these requests will decrease the amount of time that you wait before being seen by our physicians.   If you have a lab appointment with the [facility] please come in thru the Main Entrance and check in at the main information desk.           _____________________________________________________________  Should you have questions after your visit to [facility], please contact our office at [phone] and follow the prompts.  Our office hours are 8:00 a.m. to 4:30 p.m. Monday - Thursday and 8:00 a.m. to 2:30 p.m. Friday.  Please note that voicemails left after 4:00 p.m. may not be returned until the following business day.  We are closed weekends and all major holidays.  You do have access to a nurse 24-7, just call the main number to the clinic [phone] and do not press any options, hold on the line and a nurse will answer the phone.    For prescription refill requests, have your pharmacy contact our office and allow 72 hours.    Masks are optional in the cancer centers. If you would like for your care team to wear a mask  while they are taking care of you, please let them know. You may have one support person who is at least 84 years old accompany you for your appointments.

## 2022-11-26 NOTE — Patient Instructions (Signed)
[facility]  Discharge Instructions: Thank you for choosing [facility] to provide your oncology and hematology care.  If you have a lab appointment with the [facility], please come in thru the Main Entrance and check in at the main information desk.  Wear comfortable clothing and clothing appropriate for easy access to any Portacath or PICC line.   We strive to give you quality time with your provider. You may need to reschedule your appointment if you arrive late (15 or more minutes).  Arriving late affects you and other patients whose appointments are after yours.  Also, if you miss three or more appointments without notifying the office, you may be dismissed from the clinic at the provider's discretion.      For prescription refill requests, have your pharmacy contact our office and allow 72 hours for refills to be completed.    Today you received the following chemotherapy and/or immunotherapy agents Opdivo.  Nivolumab Injection What is this medication? NIVOLUMAB (nye VOL ue mab) treats some types of cancer. It works by helping your immune system slow or stop the spread of cancer cells. It is a monoclonal antibody. This medicine may be used for other purposes; ask your health care provider or pharmacist if you have questions. COMMON BRAND NAME(S): Opdivo What should I tell my care team before I take this medication? They need to know if you have any of these conditions: Allogeneic stem cell transplant (uses someone else's stem cells) Autoimmune diseases, such as Crohn disease, ulcerative colitis, lupus History of chest radiation Nervous system problems, such as Guillain-Barre syndrome or myasthenia gravis Organ transplant An unusual or allergic reaction to nivolumab, other medications, foods, dyes, or preservatives Pregnant or trying to get pregnant Breast-feeding How should I use this medication? This medication is infused into a vein. It is  given in a hospital or clinic setting. A special MedGuide will be given to you before each treatment. Be sure to read this information carefully each time. Talk to your care team about the use of this medication in children. While it may be prescribed for children as young as 12 years for selected conditions, precautions do apply. Overdosage: If you think you have taken too much of this medicine contact a poison control center or emergency room at once. NOTE: This medicine is only for you. Do not share this medicine with others. What if I miss a dose? Keep appointments for follow-up doses. It is important not to miss your dose. Call your care team if you are unable to keep an appointment. What may interact with this medication? Interactions have not been studied. This list may not describe all possible interactions. Give your health care provider a list of all the medicines, herbs, non-prescription drugs, or dietary supplements you use. Also tell them if you smoke, drink alcohol, or use illegal drugs. Some items may interact with your medicine. What should I watch for while using this medication? Your condition will be monitored carefully while you are receiving this medication. You may need blood work while taking this medication. This medication may cause serious skin reactions. They can happen weeks to months after starting the medication. Contact your care team right away if you notice fevers or flu-like symptoms with a rash. The rash may be red or purple and then turn into blisters or peeling of the skin. You may also notice a red rash with swelling of the face, lips, or lymph nodes in your neck  or under your arms. Tell your care team right away if you have any change in your eyesight. Talk to your care team if you are pregnant or think you might be pregnant. A negative pregnancy test is required before starting this medication. A reliable form of contraception is recommended while taking this  medication and for 5 months after the last dose. Talk to your care team about effective forms of contraception. Do not breast-feed while taking this medication and for 5 months after the last dose. What side effects may I notice from receiving this medication? Side effects that you should report to your care team as soon as possible: Allergic reactions--skin rash, itching, hives, swelling of the face, lips, tongue, or throat Dry cough, shortness of breath or trouble breathing Eye pain, redness, irritation, or discharge with blurry or decreased vision Heart muscle inflammation--unusual weakness or fatigue, shortness of breath, chest pain, fast or irregular heartbeat, dizziness, swelling of the ankles, feet, or hands Hormone gland problems--headache, sensitivity to light, unusual weakness or fatigue, dizziness, fast or irregular heartbeat, increased sensitivity to cold or heat, excessive sweating, constipation, hair loss, increased thirst or amount of urine, tremors or shaking, irritability Infusion reactions--chest pain, shortness of breath or trouble breathing, feeling faint or lightheaded Kidney injury (glomerulonephritis)--decrease in the amount of urine, red or dark Angas Isabell urine, foamy or bubbly urine, swelling of the ankles, hands, or feet Liver injury--right upper belly pain, loss of appetite, nausea, light-colored stool, dark yellow or Jora Galluzzo urine, yellowing skin or eyes, unusual weakness or fatigue Pain, tingling, or numbness in the hands or feet, muscle weakness, change in vision, confusion or trouble speaking, loss of balance or coordination, trouble walking, seizures Rash, fever, and swollen lymph nodes Redness, blistering, peeling, or loosening of the skin, including inside the mouth Sudden or severe stomach pain, bloody diarrhea, fever, nausea, vomiting Side effects that usually do not require medical attention (report these to your care team if they continue or are bothersome): Bone,  joint, or muscle pain Diarrhea Fatigue Loss of appetite Nausea Skin rash This list may not describe all possible side effects. Call your doctor for medical advice about side effects. You may report side effects to FDA at 1-800-FDA-1088. Where should I keep my medication? This medication is given in a hospital or clinic. It will not be stored at home. NOTE: This sheet is a summary. It may not cover all possible information. If you have questions about this medicine, talk to your doctor, pharmacist, or health care provider.  2023 Elsevier/Gold Standard (2022-03-09 00:00:00)        To help prevent nausea and vomiting after your treatment, we encourage you to take your nausea medication as directed.  BELOW ARE SYMPTOMS THAT SHOULD BE REPORTED IMMEDIATELY: *FEVER GREATER THAN 100.4 F (38 C) OR HIGHER *CHILLS OR SWEATING *NAUSEA AND VOMITING THAT IS NOT CONTROLLED WITH YOUR NAUSEA MEDICATION *UNUSUAL SHORTNESS OF BREATH *UNUSUAL BRUISING OR BLEEDING *URINARY PROBLEMS (pain or burning when urinating, or frequent urination) *BOWEL PROBLEMS (unusual diarrhea, constipation, pain near the anus) TENDERNESS IN MOUTH AND THROAT WITH OR WITHOUT PRESENCE OF ULCERS (sore throat, sores in mouth, or a toothache) UNUSUAL RASH, SWELLING OR PAIN  UNUSUAL VAGINAL DISCHARGE OR ITCHING   Items with * indicate a potential emergency and should be followed up as soon as possible or go to the Emergency Department if any problems should occur.  Please show the CHEMOTHERAPY ALERT CARD or IMMUNOTHERAPY ALERT CARD at check-in to the Emergency Department and triage  nurse.  Should you have questions after your visit or need to cancel or reschedule your appointment, please contact [facility] [phone]  and follow the prompts.  Office hours are 8:00 a.m. to 4:30 p.m. Monday - Friday. Please note that voicemails left after 4:00 p.m. may not be returned until the following business day.  We are  closed weekends and major holidays. You have access to a nurse at all times for urgent questions. Please call the main number to the clinic [phone] and follow the prompts.  For any non-urgent questions, you may also contact your provider using MyChart. We now offer e-Visits for anyone 37 and older to request care online for non-urgent symptoms. For details visit [email or web address].   Also download the MyChart app! Go to the app store, search "MyChart", open the app, select [facility] [place], and log in with your MyChart username and password.

## 2022-11-26 NOTE — Progress Notes (Signed)
Patient has been assessed, vital signs and labs have been reviewed by Dr. Katragadda. ANC, Creatinine, LFTs, and Platelets are within treatment parameters per Dr. Katragadda. The patient is good to proceed with treatment at this time. Primary RN and pharmacy aware.  

## 2022-11-26 NOTE — Addendum Note (Signed)
Addended by: Tally Due on: 11/26/2022 03:46 PM   Modules accepted: Orders

## 2022-11-26 NOTE — Addendum Note (Signed)
Addended by: Tally Due on: 11/26/2022 03:14 PM   Modules accepted: Orders

## 2022-11-27 LAB — CEA: CEA: 11.4 ng/mL — ABNORMAL HIGH (ref 0.0–4.7)

## 2022-12-03 ENCOUNTER — Other Ambulatory Visit: Payer: Self-pay | Admitting: Cardiology

## 2022-12-04 ENCOUNTER — Other Ambulatory Visit: Payer: Self-pay | Admitting: Hematology

## 2022-12-15 ENCOUNTER — Other Ambulatory Visit: Payer: Self-pay

## 2022-12-16 ENCOUNTER — Other Ambulatory Visit: Payer: Self-pay

## 2022-12-22 ENCOUNTER — Other Ambulatory Visit: Payer: Self-pay

## 2022-12-24 ENCOUNTER — Inpatient Hospital Stay: Payer: Medicare Other | Admitting: Hematology

## 2022-12-24 ENCOUNTER — Inpatient Hospital Stay: Payer: Medicare Other | Attending: Hematology

## 2022-12-24 ENCOUNTER — Inpatient Hospital Stay: Payer: Medicare Other

## 2022-12-24 VITALS — BP 157/58 | HR 52 | Temp 96.2°F | Resp 18

## 2022-12-24 DIAGNOSIS — I10 Essential (primary) hypertension: Secondary | ICD-10-CM | POA: Insufficient documentation

## 2022-12-24 DIAGNOSIS — C642 Malignant neoplasm of left kidney, except renal pelvis: Secondary | ICD-10-CM | POA: Insufficient documentation

## 2022-12-24 DIAGNOSIS — Z79811 Long term (current) use of aromatase inhibitors: Secondary | ICD-10-CM | POA: Insufficient documentation

## 2022-12-24 DIAGNOSIS — C50911 Malignant neoplasm of unspecified site of right female breast: Secondary | ICD-10-CM | POA: Diagnosis not present

## 2022-12-24 DIAGNOSIS — C182 Malignant neoplasm of ascending colon: Secondary | ICD-10-CM | POA: Diagnosis not present

## 2022-12-24 DIAGNOSIS — Z87891 Personal history of nicotine dependence: Secondary | ICD-10-CM | POA: Insufficient documentation

## 2022-12-24 DIAGNOSIS — Z170 Estrogen receptor positive status [ER+]: Secondary | ICD-10-CM | POA: Insufficient documentation

## 2022-12-24 DIAGNOSIS — M810 Age-related osteoporosis without current pathological fracture: Secondary | ICD-10-CM | POA: Diagnosis not present

## 2022-12-24 DIAGNOSIS — Z5112 Encounter for antineoplastic immunotherapy: Secondary | ICD-10-CM | POA: Diagnosis not present

## 2022-12-24 DIAGNOSIS — Z79899 Other long term (current) drug therapy: Secondary | ICD-10-CM | POA: Diagnosis not present

## 2022-12-24 DIAGNOSIS — E039 Hypothyroidism, unspecified: Secondary | ICD-10-CM | POA: Diagnosis not present

## 2022-12-24 LAB — CBC WITH DIFFERENTIAL/PLATELET
Abs Immature Granulocytes: 0.01 10*3/uL (ref 0.00–0.07)
Basophils Absolute: 0.1 10*3/uL (ref 0.0–0.1)
Basophils Relative: 1 %
Eosinophils Absolute: 0.2 10*3/uL (ref 0.0–0.5)
Eosinophils Relative: 5 %
HCT: 40.6 % (ref 36.0–46.0)
Hemoglobin: 13.2 g/dL (ref 12.0–15.0)
Immature Granulocytes: 0 %
Lymphocytes Relative: 26 %
Lymphs Abs: 1.2 10*3/uL (ref 0.7–4.0)
MCH: 29.7 pg (ref 26.0–34.0)
MCHC: 32.5 g/dL (ref 30.0–36.0)
MCV: 91.4 fL (ref 80.0–100.0)
Monocytes Absolute: 0.4 10*3/uL (ref 0.1–1.0)
Monocytes Relative: 8 %
Neutro Abs: 2.8 10*3/uL (ref 1.7–7.7)
Neutrophils Relative %: 60 %
Platelets: 157 10*3/uL (ref 150–400)
RBC: 4.44 MIL/uL (ref 3.87–5.11)
RDW: 14.1 % (ref 11.5–15.5)
WBC: 4.7 10*3/uL (ref 4.0–10.5)
nRBC: 0 % (ref 0.0–0.2)

## 2022-12-24 LAB — COMPREHENSIVE METABOLIC PANEL
ALT: 12 U/L (ref 0–44)
AST: 18 U/L (ref 15–41)
Albumin: 4 g/dL (ref 3.5–5.0)
Alkaline Phosphatase: 73 U/L (ref 38–126)
Anion gap: 11 (ref 5–15)
BUN: 27 mg/dL — ABNORMAL HIGH (ref 8–23)
CO2: 21 mmol/L — ABNORMAL LOW (ref 22–32)
Calcium: 9.6 mg/dL (ref 8.9–10.3)
Chloride: 105 mmol/L (ref 98–111)
Creatinine, Ser: 2.45 mg/dL — ABNORMAL HIGH (ref 0.44–1.00)
GFR, Estimated: 19 mL/min — ABNORMAL LOW (ref >60)
Glucose, Bld: 99 mg/dL (ref 70–99)
Potassium: 3.9 mmol/L (ref 3.5–5.1)
Sodium: 137 mmol/L (ref 135–145)
Total Bilirubin: 0.8 mg/dL (ref 0.3–1.2)
Total Protein: 6.7 g/dL (ref 6.5–8.1)

## 2022-12-24 LAB — CORTISOL: Cortisol, Plasma: 12 ug/dL

## 2022-12-24 LAB — TSH: TSH: 6.166 u[IU]/mL — ABNORMAL HIGH (ref 0.350–4.500)

## 2022-12-24 MED ORDER — SODIUM CHLORIDE 0.9% FLUSH
10.0000 mL | Freq: Once | INTRAVENOUS | Status: AC
  Administered 2022-12-24: 10 mL via INTRAVENOUS

## 2022-12-24 MED ORDER — HEPARIN SOD (PORK) LOCK FLUSH 100 UNIT/ML IV SOLN
500.0000 [IU] | Freq: Once | INTRAVENOUS | Status: AC | PRN
  Administered 2022-12-24: 500 [IU]

## 2022-12-24 MED ORDER — SODIUM CHLORIDE 0.9% FLUSH
10.0000 mL | INTRAVENOUS | Status: DC | PRN
  Administered 2022-12-24: 10 mL

## 2022-12-24 MED ORDER — SODIUM CHLORIDE 0.9 % IV SOLN
480.0000 mg | Freq: Once | INTRAVENOUS | Status: AC
  Administered 2022-12-24: 480 mg via INTRAVENOUS
  Filled 2022-12-24: qty 48

## 2022-12-24 MED ORDER — SODIUM CHLORIDE 0.9 % IV SOLN: Freq: Once | INTRAVENOUS | Status: AC

## 2022-12-24 NOTE — Progress Notes (Signed)
Ok to proceed without CMP resulted out.  T.O. Dr Rhys Martini, PharmD

## 2022-12-24 NOTE — Patient Instructions (Signed)
[facility]  Discharge Instructions: Thank you for choosing [facility] to provide your oncology and hematology care.  If you have a lab appointment with the [facility], please come in thru the Main Entrance and check in at the main information desk.  Wear comfortable clothing and clothing appropriate for easy access to any Portacath or PICC line.   We strive to give you quality time with your provider. You may need to reschedule your appointment if you arrive late (15 or more minutes).  Arriving late affects you and other patients whose appointments are after yours.  Also, if you miss three or more appointments without notifying the office, you may be dismissed from the clinic at the provider's discretion.      For prescription refill requests, have your pharmacy contact our office and allow 72 hours for refills to be completed.    Today you received the following chemotherapy and/or immunotherapy agents Opdivo. Nivolumab Injection What is this medication? NIVOLUMAB (nye VOL ue mab) treats some types of cancer. It works by helping your immune system slow or stop the spread of cancer cells. It is a monoclonal antibody. This medicine may be used for other purposes; ask your health care provider or pharmacist if you have questions. COMMON BRAND NAME(S): Opdivo What should I tell my care team before I take this medication? They need to know if you have any of these conditions: Allogeneic stem cell transplant (uses someone else's stem cells) Autoimmune diseases, such as Crohn disease, ulcerative colitis, lupus History of chest radiation Nervous system problems, such as Guillain-Barre syndrome or myasthenia gravis Organ transplant An unusual or allergic reaction to nivolumab, other medications, foods, dyes, or preservatives Pregnant or trying to get pregnant Breast-feeding How should I use this medication? This medication is infused into a vein. It is  given in a hospital or clinic setting. A special MedGuide will be given to you before each treatment. Be sure to read this information carefully each time. Talk to your care team about the use of this medication in children. While it may be prescribed for children as young as 12 years for selected conditions, precautions do apply. Overdosage: If you think you have taken too much of this medicine contact a poison control center or emergency room at once. NOTE: This medicine is only for you. Do not share this medicine with others. What if I miss a dose? Keep appointments for follow-up doses. It is important not to miss your dose. Call your care team if you are unable to keep an appointment. What may interact with this medication? Interactions have not been studied. This list may not describe all possible interactions. Give your health care provider a list of all the medicines, herbs, non-prescription drugs, or dietary supplements you use. Also tell them if you smoke, drink alcohol, or use illegal drugs. Some items may interact with your medicine. What should I watch for while using this medication? Your condition will be monitored carefully while you are receiving this medication. You may need blood work while taking this medication. This medication may cause serious skin reactions. They can happen weeks to months after starting the medication. Contact your care team right away if you notice fevers or flu-like symptoms with a rash. The rash may be red or purple and then turn into blisters or peeling of the skin. You may also notice a red rash with swelling of the face, lips, or lymph nodes in your neck or  under your arms. Tell your care team right away if you have any change in your eyesight. Talk to your care team if you are pregnant or think you might be pregnant. A negative pregnancy test is required before starting this medication. A reliable form of contraception is recommended while taking this  medication and for 5 months after the last dose. Talk to your care team about effective forms of contraception. Do not breast-feed while taking this medication and for 5 months after the last dose. What side effects may I notice from receiving this medication? Side effects that you should report to your care team as soon as possible: Allergic reactions--skin rash, itching, hives, swelling of the face, lips, tongue, or throat Dry cough, shortness of breath or trouble breathing Eye pain, redness, irritation, or discharge with blurry or decreased vision Heart muscle inflammation--unusual weakness or fatigue, shortness of breath, chest pain, fast or irregular heartbeat, dizziness, swelling of the ankles, feet, or hands Hormone gland problems--headache, sensitivity to light, unusual weakness or fatigue, dizziness, fast or irregular heartbeat, increased sensitivity to cold or heat, excessive sweating, constipation, hair loss, increased thirst or amount of urine, tremors or shaking, irritability Infusion reactions--chest pain, shortness of breath or trouble breathing, feeling faint or lightheaded Kidney injury (glomerulonephritis)--decrease in the amount of urine, red or dark brown urine, foamy or bubbly urine, swelling of the ankles, hands, or feet Liver injury--right upper belly pain, loss of appetite, nausea, light-colored stool, dark yellow or brown urine, yellowing skin or eyes, unusual weakness or fatigue Pain, tingling, or numbness in the hands or feet, muscle weakness, change in vision, confusion or trouble speaking, loss of balance or coordination, trouble walking, seizures Rash, fever, and swollen lymph nodes Redness, blistering, peeling, or loosening of the skin, including inside the mouth Sudden or severe stomach pain, bloody diarrhea, fever, nausea, vomiting Side effects that usually do not require medical attention (report these to your care team if they continue or are bothersome): Bone,  joint, or muscle pain Diarrhea Fatigue Loss of appetite Nausea Skin rash This list may not describe all possible side effects. Call your doctor for medical advice about side effects. You may report side effects to FDA at 1-800-FDA-1088. Where should I keep my medication? This medication is given in a hospital or clinic. It will not be stored at home. NOTE: This sheet is a summary. It may not cover all possible information. If you have questions about this medicine, talk to your doctor, pharmacist, or health care provider.  2023 Elsevier/Gold Standard (2022-03-09 00:00:00)       To help prevent nausea and vomiting after your treatment, we encourage you to take your nausea medication as directed.  BELOW ARE SYMPTOMS THAT SHOULD BE REPORTED IMMEDIATELY: *FEVER GREATER THAN 100.4 F (38 C) OR HIGHER *CHILLS OR SWEATING *NAUSEA AND VOMITING THAT IS NOT CONTROLLED WITH YOUR NAUSEA MEDICATION *UNUSUAL SHORTNESS OF BREATH *UNUSUAL BRUISING OR BLEEDING *URINARY PROBLEMS (pain or burning when urinating, or frequent urination) *BOWEL PROBLEMS (unusual diarrhea, constipation, pain near the anus) TENDERNESS IN MOUTH AND THROAT WITH OR WITHOUT PRESENCE OF ULCERS (sore throat, sores in mouth, or a toothache) UNUSUAL RASH, SWELLING OR PAIN  UNUSUAL VAGINAL DISCHARGE OR ITCHING   Items with * indicate a potential emergency and should be followed up as soon as possible or go to the Emergency Department if any problems should occur.  Please show the CHEMOTHERAPY ALERT CARD or IMMUNOTHERAPY ALERT CARD at check-in to the Emergency Department and triage nurse.  Should you have questions after your visit or need to cancel or reschedule your appointment, please contact [facility] [phone]  and follow the prompts.  Office hours are 8:00 a.m. to 4:30 p.m. Monday - Friday. Please note that voicemails left after 4:00 p.m. may not be returned until the following business day.  We are  closed weekends and major holidays. You have access to a nurse at all times for urgent questions. Please call the main number to the clinic [phone] and follow the prompts.  For any non-urgent questions, you may also contact your provider using MyChart. We now offer e-Visits for anyone 50 and older to request care online for non-urgent symptoms. For details visit [email or web address].   Also download the MyChart app! Go to the app store, search "MyChart", open the app, select [facility], and log in with your MyChart username and password.

## 2022-12-24 NOTE — Progress Notes (Signed)
Patient has been examined by Dr. Katragadda, and vital signs and labs have been reviewed. ANC, Creatinine, LFTs, hemoglobin, and platelets are within treatment parameters per M.D. - pt may proceed with treatment.  Primary RN and pharmacy notified.  

## 2022-12-24 NOTE — Progress Notes (Signed)
[place], [place] [address]   CLINIC:  Medical Oncology/Hematology  PCP:  Antony Contras, MD [address] / [place] [address] [phone]   REASON FOR VISIT:  Follow-up for stage I right breast cancer and colon cancer and left kidney urothelial carcinoma  PRIOR THERAPY:  1. Robotic proximal colectomy on 10/11/2019. 2. Right breast lumpectomy and SLNB on 04/02/2020 3.  Left radical nephro ureterectomy, lymphadenectomy, bladder cuff excision on 07/13/2022  NGS Results: MSI--high, Foundation 1 not sent  CURRENT THERAPY: Opdivo every 4 weeks  BRIEF ONCOLOGIC HISTORY:  Oncology History  Cancer of ascending colon s/p robotic proximal colectomy 10/11/2019  10/11/2019 Initial Diagnosis   Cancer of ascending colon s/p robotic proximal colectomy 10/11/2019   10/23/2019 Cancer Staging   Staging form: Colon and Rectum, AJCC 8th Edition - Clinical stage from 10/23/2019: Stage IVA (cT3, cN1c, cM1a) - Signed by Derek Jack, MD on 12/14/2019   05/14/2020 - 06/26/2022 Chemotherapy   Patient is on Treatment Plan : COLORECTAL Pembrolizumab q21d     05/14/2020 - 06/26/2022 Chemotherapy   Patient is on Treatment Plan : COLORECTAL Pembrolizumab (200) q21d     08/27/2022 -  Chemotherapy   Patient is on Treatment Plan : COLORECTAL Nivolumab (480) q28d     Malignant neoplasm of right female breast ([place])  04/23/2020 Initial Diagnosis   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 ([place])   04/23/2020 Cancer Staging   Staging form: Breast, AJCC 8th Edition - Clinical stage from 04/23/2020: Stage IA (cT1c, cN0(sn), cM0, G2, ER+, PR+, HER2-) - Signed by Derek Jack, MD on 04/23/2020   Urothelial carcinoma of kidney, left ([place])  08/18/2022 Initial Diagnosis   Urothelial carcinoma of kidney, left ([place])   08/27/2022 -  Chemotherapy   Patient is on Treatment Plan : COLORECTAL Nivolumab (480) q28d       CANCER STAGING:   Cancer Staging  Cancer of ascending colon s/p robotic proximal colectomy 10/11/2019 Staging form: Colon and Rectum, AJCC 8th Edition - Clinical stage from 10/23/2019: Stage IVA (cT3, cN1c, cM1a) - Signed by Derek Jack, MD on 12/14/2019  Malignant neoplasm of right female breast [facility]) Staging form: Breast, AJCC 8th Edition - Clinical stage from 04/23/2020: Stage IA (cT1c, cN0(sn), cM0, G2, ER+, PR+, HER2-) - Signed by Derek Jack, MD on 04/23/2020  Urothelial carcinoma of kidney, left [facility]) Staging form: Kidney, AJCC 8th Edition - Clinical stage from 08/18/2022: Stage III (cT3, cN0, cM0) - Unsigned   INTERVAL HISTORY:  [name], a 82 y.o. female, seen for follow-up of urothelial cancer, colon cancer and breast cancer prior to cycle 5 of Nivolumab.  She was last seen by me on 11/26/22.  Today, she states that she is doing well overall. Her appetite level is at 50-60%. Her energy level is at 40%. She denies any pain or toxicities from the Nivolumab. Her positional lightheadedness/dizziness has resolved. She denies any hot flashes. She is compliant with Anastrozole. She is compliant with her Synthroid but sometimes forgets to take it on an empty stomach. She reports some dry skin and itching but no particular rashes.   REVIEW OF SYSTEMS:  Review of Systems  Constitutional:  Negative for chills, fatigue and fever.  HENT:   Negative for lump/mass, mouth sores, nosebleeds, sore throat and trouble swallowing.   Eyes:  Negative for eye problems.  Respiratory:  Negative for cough.   Cardiovascular:  Negative for chest pain, leg swelling  and palpitations.  Gastrointestinal:  Negative for abdominal pain, constipation, diarrhea, nausea and vomiting.  Genitourinary:  Negative for bladder incontinence, difficulty urinating, dysuria, frequency, hematuria and nocturia.   Musculoskeletal:  Negative for arthralgias, back pain, flank pain, myalgias and neck pain.  Skin:  Positive for  itching. Negative for rash.  Neurological:  Negative for dizziness, headaches and numbness.  Hematological:  Does not bruise/bleed easily.  Psychiatric/Behavioral:  Negative for depression, sleep disturbance and suicidal ideas. The patient is not nervous/anxious.   All other systems reviewed and are negative.   PAST MEDICAL/SURGICAL HISTORY:  Past Medical History:  Diagnosis Date   Abdominal aortic aneurysm (HCC)    Acid reflux    Anemia    Arthritis    knees , R shoulder - tx /w injection - 11/2014   Basal cell carcinoma (BCC) of dorsum of nose 2010   Resolved   Cancer ([place])    basal cell on nose   Colon cancer (HCC)    Coronary artery disease    Hypercholesteremia    Hypertension    Hypothyroidism    Lt Acute pyelonephritis 09/11/2018   MI, old 2000   Peripheral arterial disease ([place])    hhigh-grade ostial bilateral calcified iliac stenosis with claudication   Tobacco abuse    Vertigo    when lays on left side.   Past Surgical History:  Procedure Laterality Date   BREAST LUMPECTOMY WITH RADIOACTIVE SEED AND SENTINEL LYMPH NODE BIOPSY Bilateral 04/02/2020   Procedure: BILATERAL BREAST LUMPECTOMY WITH RADIOACTIVE SEED AND RIGHT SENTINEL LYMPH NODE BIOPSY AND RIGHT TARGETED AXILLARY LYMPH NODE BIOPSY;  Surgeon: Erroll Luna, MD;  Location: [facility];  Service: General;  Laterality: Bilateral;   CARDIAC CATHETERIZATION  5 stents   CARDIAC CATHETERIZATION N/A 01/20/2016   Procedure: Left Heart Cath and Coronary Angiography;  Surgeon: Peter M Martinique, MD;  Location: [facility];  Service: Cardiovascular;  Laterality: N/A;   CATARACT EXTRACTION W/PHACO Left 05/24/2014   Procedure: CATARACT EXTRACTION PHACO AND INTRAOCULAR LENS PLACEMENT (IOC);  Surgeon: Tonny Branch, MD;  Location: [facility];  Service: Ophthalmology;  Laterality: Left;  CDE:  9.30   CATARACT EXTRACTION W/PHACO Right 06/18/2014   Procedure: CATARACT EXTRACTION PHACO AND INTRAOCULAR LENS PLACEMENT  RIGHT EYE CDE=10.84;  Surgeon: Tonny Branch, MD;  Location: [facility];  Service: Ophthalmology;  Laterality: Right;   CORONARY ARTERY BYPASS GRAFT N/A 01/24/2016   Procedure: CORONARY ARTERY BYPASS GRAFTING (CABG);  Surgeon: Gaye Pollack, MD;  Location: [facility];  Service: Open Heart Surgery;  Laterality: N/A;   CORONARY STENT PLACEMENT     CORONARY STENT PLACEMENT  03/06/15   CFX DES   CYSTOSCOPY/URETEROSCOPY/HOLMIUM LASER/STENT PLACEMENT Left 09/12/2018   Procedure: CYSTOSCOPY/URETEROSCOPY/STENT PLACEMENT;  Surgeon: Ceasar Mons, MD;  Location: [facility];  Service: Urology;  Laterality: Left;   EYE SURGERY     LEFT HEART CATH AND CORS/GRAFTS ANGIOGRAPHY N/A 09/12/2019   Procedure: LEFT HEART CATH AND CORS/GRAFTS ANGIOGRAPHY;  Surgeon: Troy Sine, MD;  Location: [facility];  Service: Cardiovascular;  Laterality: N/A;   LEFT HEART CATHETERIZATION WITH CORONARY ANGIOGRAM N/A 03/06/2015   Procedure: LEFT HEART CATHETERIZATION WITH CORONARY ANGIOGRAM;  Surgeon: Lorretta Harp, MD;  Location: [facility] C[facility];  Service: Cardiovascular;  Laterality: N/A;   PARTIAL KNEE ARTHROPLASTY Right 02/04/2015   Procedure: UNICOMPARTMENTAL KNEE;  Surgeon: Dorna Leitz, MD;  Location: [facility];  Service: Orthopedics;  Laterality: Right;   PERIPHERAL VASCULAR CATHETERIZATION Bilateral 05/13/2015  Procedure: Lower Extremity Angiography;  Surgeon: Lorretta Harp, MD;  Location: [facility];  Service: Cardiovascular;  Laterality: Bilateral;   PERIPHERAL VASCULAR CATHETERIZATION N/A 05/13/2015   Procedure: Abdominal Aortogram;  Surgeon: Lorretta Harp, MD;  Location: [facility];  Service: Cardiovascular;  Laterality: N/A;   PERIPHERAL VASCULAR CATHETERIZATION Bilateral 06/27/2015   Procedure: Peripheral Vascular Intervention;  Surgeon: Lorretta Harp, MD;  Location: [facility];  Service: Cardiovascular;  Laterality: Bilateral;  ILIACS   PERIPHERAL VASCULAR CATHETERIZATION Bilateral  06/27/2015   Procedure: Peripheral Vascular Atherectomy;  Surgeon: Lorretta Harp, MD;  Location: [facility];  Service: Cardiovascular;  Laterality: Bilateral;   PORTACATH PLACEMENT Left 05/24/2020   Procedure: INSERTION PORT-A-CATH;  Surgeon: Aviva Signs, MD;  Location[facility];  Service: General;  Laterality: Left;   TEE WITHOUT CARDIOVERSION N/A 01/24/2016   Procedure: TRANSESOPHAGEAL ECHOCARDIOGRAM (TEE);  Surgeon: Gaye Pollack, MD;  Location: [facility];  Service: Open Heart Surgery;  Laterality: N/A;   TONSILLECTOMY     age 85   TUBAL LIGATION      SOCIAL HISTORY:  Social History   Socioeconomic History   Marital status: Married    Spouse name: Tommy   Number of children: 4   Years of education: Not on file   Highest education level: Not on file  Occupational History   Occupation: retired    Comment: worked as Presenter, broadcasting for [employer]  Tobacco Use   Smoking status: Former    Packs/day: 1.50    Years: 42.00    Total pack years: 63.00    Types: Cigarettes    Quit date: 05/19/1999    Years since quitting: 23.6   Smokeless tobacco: Never  Vaping Use   Vaping Use: Never used  Substance and Sexual Activity   Alcohol use: No   Drug use: No   Sexual activity: Yes    Birth control/protection: Surgical  Other Topics Concern   Not on file  Social History Narrative   Not on file   Social Determinants of Health   Financial Resource Strain: [place] (02/18/2021)   Overall Financial Resource Strain (CARDIA)    Difficulty of Paying Living Expenses: Somewhat hard  Food Insecurity: No Food Insecurity (02/18/2021)   Hunger Vital Sign    Worried About Running Out of Food in the Last Year: Never true    Van in the Last Year: Never true  Transportation Needs: No Transportation Needs (02/18/2021)   PRAPARE - Hydrologist (Medical): No    Lack of Transportation (Non-Medical): No  Physical Activity: Inactive (02/18/2021)    Exercise Vital Sign    Days of Exercise per Week: 0 days    Minutes of Exercise per Session: 0 min  Stress: No Stress Concern Present (02/18/2021)   [place]    Feeling of Stress : Not at all  Social Connections: Moderately Isolated (02/18/2021)   Social Connection and Isolation Panel [NHANES]    Frequency of Communication with Friends and Family: More than three times a week    Frequency of Social Gatherings with Friends and Family: Three times a week    Attends Religious Services: Never    Active Member of Clubs or Organizations: No    Attends Archivist Meetings: Never    Marital Status: Married  Human resources officer Violence: Not At Risk (02/18/2021)   Humiliation, Afraid,  Rape, and Kick questionnaire    Fear of Current or Ex-Partner: No    Emotionally Abused: No    Physically Abused: No    Sexually Abused: No    FAMILY HISTORY:  Family History  Problem Relation Age of Onset   Ovarian cancer Mother 84   Cancer Father 65       unsure of which kind, "it was in his glands"   Hypertension Maternal Grandmother    Stroke Maternal Grandfather    Hypertension Son    Brain cancer Sister    Hypertension Daughter    Heart attack Neg Hx    Colon cancer Neg Hx    Esophageal cancer Neg Hx    Inflammatory bowel disease Neg Hx    Liver disease Neg Hx    Pancreatic cancer Neg Hx    Rectal cancer Neg Hx    Stomach cancer Neg Hx     CURRENT MEDICATIONS:  Current Outpatient Medications  Medication Sig Dispense Refill   amLODipine (NORVASC) 5 MG tablet TAKE 1 TABLET BY MOUTH ONCE A DAY. 30 tablet 4   anastrozole (ARIMIDEX) 1 MG tablet TAKE 1 TABLET BY MOUTH ONCE DAILY. 30 tablet 0   aspirin EC 81 MG tablet Take 81 mg by mouth every evening.      atorvastatin (LIPITOR) 40 MG tablet TAKE (1) TABLET BY MOUTH ONCE DAILY. KEEP FUTURE APPOINTMENTS FOR ANY FURTHER REFILLS. 90 tablet 3   celecoxib (CELEBREX) 100 MG  capsule Take 100 mg by mouth 2 (two) times daily.     clopidogrel (PLAVIX) 75 MG tablet Take 1 tablet (75 mg total) by mouth daily. FILLS. 90 tablet 3   esomeprazole (NEXIUM) 20 MG capsule Take 20 mg by mouth daily.      HYDROcodone-acetaminophen (NORCO/VICODIN) 5-325 MG tablet Take 1 tablet by mouth daily. 15 tablet 0   hydrOXYzine (ATARAX) 25 MG tablet TAKE (1) TABLET BY MOUTH AT BEDTIME AS NEEDED 30 tablet 0   levothyroxine (SYNTHROID) 25 MCG tablet Take 25 mcg by mouth daily.     metoprolol tartrate (LOPRESSOR) 25 MG tablet TAKE 1 TABLET BY MOUTH TWICE DAILY. 160 tablet 3   tamsulosin (FLOMAX) 0.4 MG CAPS capsule Take 0.4 mg by mouth daily.     Vitamin D, Ergocalciferol, (DRISDOL) 1.25 MG (50000 UNIT) CAPS capsule TAKE 1 CAPSULE BY MOUTH ONCE A WEEK. 4 capsule 0   nitroGLYCERIN (NITROSTAT) 0.4 MG SL tablet DISSOLVE 1 TABLET SUBLINGUALLY AS NEEDED FOR CHEST PAIN, MAY REPEAT EVERY 5 MINUTES. AFTER 3 CALL 911. (Patient not taking: Reported on 12/24/2022) 25 tablet 4   No current facility-administered medications for this visit.   Facility-Administered Medications Ordered in Other Visits  Medication Dose Route Frequency Provider Last Rate Last Admin   sodium chloride flush (NS) 0.9 % injection 10 mL  10 mL Intracatheter PRN Derek Jack, MD   10 mL at 11/26/22 1631    ALLERGIES:  Allergies  Allergen Reactions   Crab [Shellfish Allergy] Nausea And Vomiting    Throws up violently   Ezetimibe Diarrhea   Keflex [Cephalexin]     Caused sores   Other Nausea And Vomiting    Shrimp    PHYSICAL EXAM:  Performance status (ECOG): 1 - Symptomatic but completely ambulatory  There were no vitals filed for this visit.  Wt Readings from Last 3 Encounters:  12/24/22 68.3 kg (150 lb 9.6 oz)  11/26/22 67.2 kg (148 lb 1.6 oz)  10/29/22 67.6 kg (149 lb 0.5 oz)  Physical Exam Vitals reviewed. Exam conducted with a chaperone present.  Constitutional:      Appearance: Normal appearance.   Cardiovascular:     Rate and Rhythm: Normal rate and regular rhythm.     Pulses: Normal pulses.     Heart sounds: Normal heart sounds.  Pulmonary:     Effort: Pulmonary effort is normal.     Breath sounds: Normal breath sounds.  Abdominal:     Palpations: Abdomen is soft. There is no hepatomegaly, splenomegaly or mass.     Tenderness: There is no abdominal tenderness.  Lymphadenopathy:     Upper Body:     Right upper body: No supraclavicular, axillary or pectoral adenopathy.     Left upper body: No supraclavicular, axillary or pectoral adenopathy.  Neurological:     General: No focal deficit present.     Mental Status: She is alert and oriented to person, place, and time.  Psychiatric:        Mood and Affect: Mood normal.        Behavior: Behavior normal.     LABORATORY DATA:  I have reviewed the labs as listed.     Latest Ref Rng & Units 12/24/2022   10:40 AM 11/26/2022   12:03 PM 10/29/2022   12:43 PM  CBC  WBC 4.0 - 10.5 K/uL 4.7  5.9  4.7   Hemoglobin 12.0 - 15.0 g/dL 13.2  13.1  12.6   Hematocrit 36.0 - 46.0 % 40.6  41.0  39.5   Platelets 150 - 400 K/uL 157  174  170       Latest Ref Rng & Units 11/26/2022   12:03 PM 10/29/2022   12:43 PM 09/29/2022   12:06 PM  CMP  Glucose 70 - 99 mg/dL 97  98  115   BUN 8 - 23 mg/dL '27  26  30   '$ Creatinine 0.44 - 1.00 mg/dL 2.40  2.30  2.64   Sodium 135 - 145 mmol/L 137  136  137   Potassium 3.5 - 5.1 mmol/L 3.7  4.0  3.5   Chloride 98 - 111 mmol/L 106  106  108   CO2 22 - 32 mmol/L '23  22  22   '$ Calcium 8.9 - 10.3 mg/dL 9.4  9.4  9.4   Total Protein 6.5 - 8.1 g/dL 6.8  6.5  7.5   Total Bilirubin 0.3 - 1.2 mg/dL 0.9  0.8  0.7   Alkaline Phos 38 - 126 U/L 82  75  93   AST 15 - 41 U/L '20  18  16   '$ ALT 0 - 44 U/L '11  13  26    '$ Component     Latest Ref Rng 12/30/2021 02/11/2022 06/04/2022 11/26/2022  CEA     0.0 - 4.7 ng/mL 8.7 (H)  7.9 (H)  9.0 (H)  11.4 (H)      DIAGNOSTIC IMAGING:  I have independently reviewed the scans and  discussed with the patient. No results found.   ASSESSMENT:  1.  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  Right hemicolectomy on 10/11/2019, grade 3 adenocarcinoma, negative margins, positive LVSI, 2 tumor deposits, 0/15 lymph nodes positive, PT3PN1C. 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  MLH1 hyper methylation present. -PET scan in December 2020 showed lymph node in the right axillary region.  Biopsy consistent with metastatic colon cancer. -Last CEA was 10.7 on 11/22/2019. -  Right axillary lymph node excision on 04/02/2020 consistent with metastatic carcinoma from colon cancer. -Pembrolizumab started on 05/14/2020. -PET scan on 08/05/2020 shows complete resolution of lymphadenopathy.  No evidence of new areas of uptake.  Mild vague residual hypermetabolism in the right axilla without soft tissue mass. -PET scan on 12/23/2020 with no evidence of recurrence. Beryle Flock held since we started opdivo on 08/27/2022   2.  Stage I (PT1CPN0) right Breast, Grade 2 Invasive Lobular Carcinoma: -MRI of the breast on 12/25/2019 showed 1 cm mass behind the right areola with a suspicious mass in the left breast. -Right breast lumpectomy on 04/02/2020 shows invasive lobular carcinoma, grade 2, 1.2 cm.  Resection margins are negative.  Negative LVSI.  2 sentinel lymph nodes were negative for carcinoma.  ER/PR 100% positive, HER-2 negative, Ki-67 10%.  3.  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 - Based on her renal  function, she is not a candidate for cisplatin based chemotherapy.  Nivolumab was recommended for 1 year. - PET scan (08/06/2022): No evidence of metastatic disease. - Opdivo for 1 year started on 08/27/2022   PLAN:  1.  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  Will continue to monitor.   2.  Right breast invasive lobular carcinoma, grade 2: - Last mammogram on 03/31/2022, BI-RADS Category 1. - Continue anastrozole daily.   3.  Osteoporosis: - Continue vitamin D 50,000 units weekly.   4.  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 - He is tolerating nivolumab very well.  Dizziness has resolved. - No immunotherapy related side effects. - LFTs normal today.  Creatinine 2.45 and stable.  CBC was grossly normal. - Proceed with nivolumab today.  RTC 4 weeks for follow-up.   5.  Hypothyroidism: - TSH is 6.1.  Continue Synthroid 50 mcg daily.  Occasionally she forgets to take on empty stomach.   Orders placed this encounter:  No orders of the defined types were placed in this encounter.    I,Alexis Herring,acting as a Education administrator for [employer], MD.,have documented all relevant documentation on the behalf of Derek Jack, MD,as directed by  Derek Jack, MD while in the presence of Derek Jack, MD.  I, Derek Jack MD, have reviewed the above documentation for accuracy and completeness, and I agree with the above.   Derek Jack, MD [facility] [phone]

## 2022-12-24 NOTE — Progress Notes (Signed)
Labs reviewed with Md today, ok to treat without CMET results per verbal order from Anastasio Champion RN/ Dr. Delton Coombes

## 2022-12-24 NOTE — Patient Instructions (Signed)
[place] at [facility] Discharge Instructions   You were seen and examined today by Dr. Delton Coombes.  He reviewed the results of your lab work which are normal/stable.   We will proceed with your treatment today.   Continue anastrozole as prescribed.   Continue Synthroid as prescribed on an empty stomach at least 30 minutes prior to your first meal.   Return as scheduled.    Thank you for choosing [place] at [facility] to provide your oncology and hematology care.  To afford each patient quality time with our provider, please arrive at least 15 minutes before your scheduled appointment time.   If you have a lab appointment with the [facility] please come in thru the Main Entrance and check in at the main information desk.  You need to re-schedule your appointment should you arrive 10 or more minutes late.  We strive to give you quality time with our providers, and arriving late affects you and other patients whose appointments are after yours.  Also, if you no show three or more times for appointments you may be dismissed from the clinic at the providers discretion.     Again, thank you for choosing [facility].  Our hope is that these requests will decrease the amount of time that you wait before being seen by our physicians.       _____________________________________________________________  Should you have questions after your visit to [facility], please contact our office at [phone] and follow the prompts.  Our office hours are 8:00 a.m. and 4:30 p.m. Monday - Friday.  Please note that voicemails left after 4:00 p.m. may not be returned until the following business day.  We are closed weekends and major holidays.  You do have access to a nurse 24-7, just call the main number to the clinic [phone] and do not press any options, hold on the line and a nurse will answer the phone.    For  prescription refill requests, have your pharmacy contact our office and allow 72 hours.    Due to Covid, you will need to wear a mask upon entering the hospital. If you do not have a mask, a mask will be given to you at the Main Entrance upon arrival. For doctor visits, patients may have 1 support person age 41 or older with them. For treatment visits, patients can not have anyone with them due to social distancing guidelines and our immunocompromised population.

## 2022-12-24 NOTE — Progress Notes (Signed)
Message received from A. Ouida Sills RN / Dr. Delton Coombes to proceed with treatment.   Treatment given today per MD orders. Tolerated infusion without adverse affects. Vital signs stable. No complaints at this time. Discharged from clinic ambulatory in stable condition. Alert and oriented x 3. F/U with [facility] as scheduled.

## 2022-12-24 NOTE — Progress Notes (Signed)
Patients port flushed without difficulty.  Good blood return noted with no bruising or swelling noted at site.  Stable during access and blood draw.  Patient to remain accessed for treatment. 

## 2022-12-24 NOTE — Progress Notes (Signed)
Patient is taking anastrozole as prescribed.  She has not missed any doses and reports no side effects at this time.

## 2023-01-06 ENCOUNTER — Other Ambulatory Visit: Payer: Self-pay | Admitting: Hematology

## 2023-01-06 ENCOUNTER — Encounter: Payer: Self-pay | Admitting: *Deleted

## 2023-01-06 DIAGNOSIS — C50911 Malignant neoplasm of unspecified site of right female breast: Secondary | ICD-10-CM

## 2023-01-06 NOTE — Progress Notes (Signed)
Anastrozole refill approved.  Patient is tolerating and is to continue therapy.  

## 2023-01-12 ENCOUNTER — Other Ambulatory Visit: Payer: Self-pay

## 2023-01-14 ENCOUNTER — Other Ambulatory Visit: Payer: Self-pay

## 2023-01-14 DIAGNOSIS — C182 Malignant neoplasm of ascending colon: Secondary | ICD-10-CM

## 2023-01-16 ENCOUNTER — Other Ambulatory Visit: Payer: Self-pay

## 2023-01-18 ENCOUNTER — Inpatient Hospital Stay: Payer: Medicare Other

## 2023-01-18 ENCOUNTER — Inpatient Hospital Stay: Payer: Medicare Other | Admitting: Hematology

## 2023-01-18 VITALS — BP 128/70 | HR 88 | Temp 96.7°F | Resp 20 | Ht 63.0 in | Wt 154.2 lb

## 2023-01-18 VITALS — BP 163/70 | HR 64 | Temp 96.6°F | Resp 18

## 2023-01-18 DIAGNOSIS — R7989 Other specified abnormal findings of blood chemistry: Secondary | ICD-10-CM

## 2023-01-18 DIAGNOSIS — Z79811 Long term (current) use of aromatase inhibitors: Secondary | ICD-10-CM | POA: Diagnosis not present

## 2023-01-18 DIAGNOSIS — Z95828 Presence of other vascular implants and grafts: Secondary | ICD-10-CM

## 2023-01-18 DIAGNOSIS — E039 Hypothyroidism, unspecified: Secondary | ICD-10-CM | POA: Diagnosis not present

## 2023-01-18 DIAGNOSIS — C642 Malignant neoplasm of left kidney, except renal pelvis: Secondary | ICD-10-CM | POA: Diagnosis not present

## 2023-01-18 DIAGNOSIS — Z170 Estrogen receptor positive status [ER+]: Secondary | ICD-10-CM | POA: Diagnosis not present

## 2023-01-18 DIAGNOSIS — C182 Malignant neoplasm of ascending colon: Secondary | ICD-10-CM

## 2023-01-18 DIAGNOSIS — C50911 Malignant neoplasm of unspecified site of right female breast: Secondary | ICD-10-CM | POA: Diagnosis not present

## 2023-01-18 DIAGNOSIS — I10 Essential (primary) hypertension: Secondary | ICD-10-CM | POA: Diagnosis not present

## 2023-01-18 DIAGNOSIS — M810 Age-related osteoporosis without current pathological fracture: Secondary | ICD-10-CM | POA: Diagnosis not present

## 2023-01-18 DIAGNOSIS — Z87891 Personal history of nicotine dependence: Secondary | ICD-10-CM | POA: Diagnosis not present

## 2023-01-18 DIAGNOSIS — Z5112 Encounter for antineoplastic immunotherapy: Secondary | ICD-10-CM | POA: Diagnosis not present

## 2023-01-18 DIAGNOSIS — Z79899 Other long term (current) drug therapy: Secondary | ICD-10-CM | POA: Diagnosis not present

## 2023-01-18 LAB — CBC WITH DIFFERENTIAL/PLATELET
Abs Immature Granulocytes: 0.01 10*3/uL (ref 0.00–0.07)
Basophils Absolute: 0 10*3/uL (ref 0.0–0.1)
Basophils Relative: 1 %
Eosinophils Absolute: 0.1 10*3/uL (ref 0.0–0.5)
Eosinophils Relative: 2 %
HCT: 40.9 % (ref 36.0–46.0)
Hemoglobin: 13.2 g/dL (ref 12.0–15.0)
Immature Granulocytes: 0 %
Lymphocytes Relative: 24 %
Lymphs Abs: 1 10*3/uL (ref 0.7–4.0)
MCH: 29.7 pg (ref 26.0–34.0)
MCHC: 32.3 g/dL (ref 30.0–36.0)
MCV: 91.9 fL (ref 80.0–100.0)
Monocytes Absolute: 0.3 10*3/uL (ref 0.1–1.0)
Monocytes Relative: 7 %
Neutro Abs: 2.8 10*3/uL (ref 1.7–7.7)
Neutrophils Relative %: 66 %
Platelets: 154 10*3/uL (ref 150–400)
RBC: 4.45 MIL/uL (ref 3.87–5.11)
RDW: 13.9 % (ref 11.5–15.5)
WBC: 4.3 10*3/uL (ref 4.0–10.5)
nRBC: 0 % (ref 0.0–0.2)

## 2023-01-18 LAB — COMPREHENSIVE METABOLIC PANEL
ALT: 13 U/L (ref 0–44)
AST: 19 U/L (ref 15–41)
Albumin: 3.8 g/dL (ref 3.5–5.0)
Alkaline Phosphatase: 73 U/L (ref 38–126)
Anion gap: 12 (ref 5–15)
BUN: 32 mg/dL — ABNORMAL HIGH (ref 8–23)
CO2: 21 mmol/L — ABNORMAL LOW (ref 22–32)
Calcium: 9.3 mg/dL (ref 8.9–10.3)
Chloride: 106 mmol/L (ref 98–111)
Creatinine, Ser: 2.5 mg/dL — ABNORMAL HIGH (ref 0.44–1.00)
GFR, Estimated: 19 mL/min — ABNORMAL LOW (ref >60)
Glucose, Bld: 123 mg/dL — ABNORMAL HIGH (ref 70–99)
Potassium: 3.6 mmol/L (ref 3.5–5.1)
Sodium: 139 mmol/L (ref 135–145)
Total Bilirubin: 0.7 mg/dL (ref 0.3–1.2)
Total Protein: 6.8 g/dL (ref 6.5–8.1)

## 2023-01-18 LAB — MAGNESIUM: Magnesium: 1.9 mg/dL (ref 1.7–2.4)

## 2023-01-18 LAB — TSH: TSH: 6.222 u[IU]/mL — ABNORMAL HIGH (ref 0.350–4.500)

## 2023-01-18 MED ORDER — SODIUM CHLORIDE 0.9% FLUSH
10.0000 mL | INTRAVENOUS | Status: DC | PRN
  Administered 2023-01-18: 10 mL

## 2023-01-18 MED ORDER — SODIUM CHLORIDE 0.9 % IV SOLN
480.0000 mg | Freq: Once | INTRAVENOUS | Status: AC
  Administered 2023-01-18: 480 mg via INTRAVENOUS
  Filled 2023-01-18: qty 48

## 2023-01-18 MED ORDER — SODIUM CHLORIDE 0.9 % IV SOLN: Freq: Once | INTRAVENOUS | Status: AC

## 2023-01-18 MED ORDER — HEPARIN SOD (PORK) LOCK FLUSH 100 UNIT/ML IV SOLN
500.0000 [IU] | Freq: Once | INTRAVENOUS | Status: AC | PRN
  Administered 2023-01-18: 500 [IU]

## 2023-01-18 MED ORDER — SODIUM CHLORIDE 0.9% FLUSH
10.0000 mL | Freq: Once | INTRAVENOUS | Status: AC
  Administered 2023-01-18: 10 mL via INTRAVENOUS

## 2023-01-18 MED ORDER — LEVOTHYROXINE SODIUM 50 MCG PO TABS: 25.0000 ug | ORAL_TABLET | Freq: Every day | ORAL | 5 refills | Status: DC

## 2023-01-18 NOTE — Patient Instructions (Signed)
Jessica Taylor at [facility] Discharge Instructions   You were seen and examined today by Dr. Delton Coombes.  He reviewed the results of your lab work which are normal/stable.   We will proceed with your treatment today.   Continue anastrozole as prescribed.   Return as scheduled.    Thank you for choosing [place] at [facility] to provide your oncology and hematology care.  To afford each patient quality time with our provider, please arrive at least 15 minutes before your scheduled appointment time.   If you have a lab appointment with the [facility] please come in thru the Main Entrance and check in at the main information desk.  You need to re-schedule your appointment should you arrive 10 or more minutes late.  We strive to give you quality time with our providers, and arriving late affects you and other patients whose appointments are after yours.  Also, if you no show three or more times for appointments you may be dismissed from the clinic at the providers discretion.     Again, thank you for choosing [facility].  Our hope is that these requests will decrease the amount of time that you wait before being seen by our physicians.       _____________________________________________________________  Should you have questions after your visit to [facility], please contact our office at [phone] and follow the prompts.  Our office hours are 8:00 a.m. and 4:30 p.m. Monday - Friday.  Please note that voicemails left after 4:00 p.m. may not be returned until the following business day.  We are closed weekends and major holidays.  You do have access to a nurse 24-7, just call the main number to the clinic [phone] and do not press any options, hold on the line and a nurse will answer the phone.    For prescription refill requests, have your pharmacy contact our office and allow 72 hours.    Due to Covid, you  will need to wear a mask upon entering the hospital. If you do not have a mask, a mask will be given to you at the Main Entrance upon arrival. For doctor visits, patients may have 1 support person age 59 or older with them. For treatment visits, patients can not have anyone with them due to social distancing guidelines and our immunocompromised population.

## 2023-01-18 NOTE — Patient Instructions (Signed)
[facility]  Discharge Instructions: Thank you for choosing [facility] to provide your oncology and hematology care.  If you have a lab appointment with the [facility], please come in thru the Main Entrance and check in at the main information desk.  Wear comfortable clothing and clothing appropriate for easy access to any Portacath or PICC line.   We strive to give you quality time with your provider. You may need to reschedule your appointment if you arrive late (15 or more minutes).  Arriving late affects you and other patients whose appointments are after yours.  Also, if you miss three or more appointments without notifying the office, you may be dismissed from the clinic at the provider's discretion.      For prescription refill requests, have your pharmacy contact our office and allow 72 hours for refills to be completed.    Today you received the following chemotherapy and/or immunotherapy agents Opdivo.  Nivolumab Injection What is this medication? NIVOLUMAB (nye VOL ue mab) treats some types of cancer. It works by helping your immune system slow or stop the spread of cancer cells. It is a monoclonal antibody. This medicine may be used for other purposes; ask your health care provider or pharmacist if you have questions. COMMON BRAND NAME(S): Opdivo What should I tell my care team before I take this medication? They need to know if you have any of these conditions: Allogeneic stem cell transplant (uses someone else's stem cells) Autoimmune diseases, such as Crohn disease, ulcerative colitis, lupus History of chest radiation Nervous system problems, such as Guillain-Barre syndrome or myasthenia gravis Organ transplant An unusual or allergic reaction to nivolumab, other medications, foods, dyes, or preservatives Pregnant or trying to get pregnant Breast-feeding How should I use this medication? This medication is infused into a vein. It is  given in a hospital or clinic setting. A special MedGuide will be given to you before each treatment. Be sure to read this information carefully each time. Talk to your care team about the use of this medication in children. While it may be prescribed for children as young as 12 years for selected conditions, precautions do apply. Overdosage: If you think you have taken too much of this medicine contact a poison control center or emergency room at once. NOTE: This medicine is only for you. Do not share this medicine with others. What if I miss a dose? Keep appointments for follow-up doses. It is important not to miss your dose. Call your care team if you are unable to keep an appointment. What may interact with this medication? Interactions have not been studied. This list may not describe all possible interactions. Give your health care provider a list of all the medicines, herbs, non-prescription drugs, or dietary supplements you use. Also tell them if you smoke, drink alcohol, or use illegal drugs. Some items may interact with your medicine. What should I watch for while using this medication? Your condition will be monitored carefully while you are receiving this medication. You may need blood work while taking this medication. This medication may cause serious skin reactions. They can happen weeks to months after starting the medication. Contact your care team right away if you notice fevers or flu-like symptoms with a rash. The rash may be red or purple and then turn into blisters or peeling of the skin. You may also notice a red rash with swelling of the face, lips, or lymph nodes in your neck  or under your arms. Tell your care team right away if you have any change in your eyesight. Talk to your care team if you are pregnant or think you might be pregnant. A negative pregnancy test is required before starting this medication. A reliable form of contraception is recommended while taking this  medication and for 5 months after the last dose. Talk to your care team about effective forms of contraception. Do not breast-feed while taking this medication and for 5 months after the last dose. What side effects may I notice from receiving this medication? Side effects that you should report to your care team as soon as possible: Allergic reactions--skin rash, itching, hives, swelling of the face, lips, tongue, or throat Dry cough, shortness of breath or trouble breathing Eye pain, redness, irritation, or discharge with blurry or decreased vision Heart muscle inflammation--unusual weakness or fatigue, shortness of breath, chest pain, fast or irregular heartbeat, dizziness, swelling of the ankles, feet, or hands Hormone gland problems--headache, sensitivity to light, unusual weakness or fatigue, dizziness, fast or irregular heartbeat, increased sensitivity to cold or heat, excessive sweating, constipation, hair loss, increased thirst or amount of urine, tremors or shaking, irritability Infusion reactions--chest pain, shortness of breath or trouble breathing, feeling faint or lightheaded Kidney injury (glomerulonephritis)--decrease in the amount of urine, red or dark Jessica Taylor urine, foamy or bubbly urine, swelling of the ankles, hands, or feet Liver injury--right upper belly pain, loss of appetite, nausea, light-colored stool, dark yellow or Jessica Taylor urine, yellowing skin or eyes, unusual weakness or fatigue Pain, tingling, or numbness in the hands or feet, muscle weakness, change in vision, confusion or trouble speaking, loss of balance or coordination, trouble walking, seizures Rash, fever, and swollen lymph nodes Redness, blistering, peeling, or loosening of the skin, including inside the mouth Sudden or severe stomach pain, bloody diarrhea, fever, nausea, vomiting Side effects that usually do not require medical attention (report these to your care team if they continue or are bothersome): Bone,  joint, or muscle pain Diarrhea Fatigue Loss of appetite Nausea Skin rash This list may not describe all possible side effects. Call your doctor for medical advice about side effects. You may report side effects to FDA at 1-800-FDA-1088. Where should I keep my medication? This medication is given in a hospital or clinic. It will not be stored at home. NOTE: This sheet is a summary. It may not cover all possible information. If you have questions about this medicine, talk to your doctor, pharmacist, or health care provider.  2023 Elsevier/Gold Standard (2014-10-01 00:00:00)        To help prevent nausea and vomiting after your treatment, we encourage you to take your nausea medication as directed.  BELOW ARE SYMPTOMS THAT SHOULD BE REPORTED IMMEDIATELY: *FEVER GREATER THAN 100.4 F (38 C) OR HIGHER *CHILLS OR SWEATING *NAUSEA AND VOMITING THAT IS NOT CONTROLLED WITH YOUR NAUSEA MEDICATION *UNUSUAL SHORTNESS OF BREATH *UNUSUAL BRUISING OR BLEEDING *URINARY PROBLEMS (pain or burning when urinating, or frequent urination) *BOWEL PROBLEMS (unusual diarrhea, constipation, pain near the anus) TENDERNESS IN MOUTH AND THROAT WITH OR WITHOUT PRESENCE OF ULCERS (sore throat, sores in mouth, or a toothache) UNUSUAL RASH, SWELLING OR PAIN  UNUSUAL VAGINAL DISCHARGE OR ITCHING   Items with * indicate a potential emergency and should be followed up as soon as possible or go to the Emergency Department if any problems should occur.  Please show the CHEMOTHERAPY ALERT CARD or IMMUNOTHERAPY ALERT CARD at check-in to the Emergency Department and triage  nurse.  Should you have questions after your visit or need to cancel or reschedule your appointment, please contact [facility] [phone]  and follow the prompts.  Office hours are 8:00 a.m. to 4:30 p.m. Monday - Friday. Please note that voicemails left after 4:00 p.m. may not be returned until the following business day.  We are  closed weekends and major holidays. You have access to a nurse at all times for urgent questions. Please call the main number to the clinic [phone] and follow the prompts.  For any non-urgent questions, you may also contact your provider using MyChart. We now offer e-Visits for anyone 37 and older to request care online for non-urgent symptoms. For details visit [email or web address].   Also download the MyChart app! Go to the app store, search "MyChart", open the app, select [place], and log in with your MyChart username and password.

## 2023-01-18 NOTE — Progress Notes (Signed)
Patient presents today for chemotherapy infusion.  Patient is in satisfactory condition with no complaints voiced.  Vital signs are stable.  Labs reviewed by Dr. Delton Coombes during her office visit.  Creatinine today is 2.50.  MD aware.  All other labs are within treatment parameters.  We will give NS 500 mL IV over one hour today per Dr. Delton Coombes.  We will proceed with treatment per MD orders.  Patient tolerated treatment well with no complaints voiced.  Patient left ambulatory in stable condition.  Vital signs stable at discharge.  Follow up as scheduled.

## 2023-01-18 NOTE — Progress Notes (Signed)
Patient has been examined by Dr. Delton Coombes. Vital signs and labs have been reviewed by MD - ANC, Creatinine (2.5), LFTs, hemoglobin, and platelets are within treatment parameters per M.D. - pt may proceed with treatment. Give NS 500 ml over 1 hour also per MD Primary RN and pharmacy notified.

## 2023-01-18 NOTE — Progress Notes (Signed)
Ok to treat early outside of 2 day variance.  T.O. Dr Rhys Martini, PharmD

## 2023-01-18 NOTE — Progress Notes (Signed)
Jessica Taylor [address] [place] [address]    Clinic Day:  01/18/2023  Referring physician: Antony Contras, MD  Patient Care Team: Antony Contras, MD as PCP - General (Family Medicine) Jerline Pain, MD as PCP - Cardiology (Cardiology) Michael Boston, MD as Consulting Physician (General Surgery) Jerline Pain, MD as Consulting Physician (Cardiology) Mansouraty, Telford Nab., MD as Consulting Physician (Gastroenterology) Donetta Potts, RN as Oncology Nurse Navigator Derek Jack, MD as Consulting Physician (Medical Oncology)   ASSESSMENT & PLAN:   Assessment: 1.  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  Right hemicolectomy on 10/11/2019, grade 3 adenocarcinoma, negative margins, positive LVSI, 2 tumor deposits, 0/15 lymph nodes positive, PT3PN1C. 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  MLH1 hyper methylation present. -PET scan in December 2020 showed lymph node in the right axillary region.  Biopsy consistent with metastatic colon cancer. -Last CEA was 10.7 on 11/22/2019. -Right axillary lymph node excision on 04/02/2020 consistent with metastatic carcinoma from colon cancer. -Pembrolizumab started on 05/14/2020. -PET scan on 08/05/2020 shows complete resolution of lymphadenopathy.  No evidence of new areas of uptake.  Mild vague residual hypermetabolism in the right axilla without soft tissue mass. -PET scan on 12/23/2020 with no evidence of recurrence. Jessica Taylor held since we started opdivo on 08/27/2022   2.  Stage I (PT1CPN0) right Breast, Grade 2 Invasive Lobular Carcinoma: -MRI of the breast on 12/25/2019 showed 1 cm mass behind the right areola with a suspicious mass in the left breast. -Right breast lumpectomy on 04/02/2020 shows invasive lobular carcinoma, grade 2, 1.2 cm.  Resection margins are negative.  Negative LVSI.  2 sentinel lymph nodes  were negative for carcinoma.  ER/PR 100% positive, HER-2 negative, Ki-67 10%.   3.  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 - Based on her renal function, she is not a candidate for cisplatin based chemotherapy.  Nivolumab was recommended for 1 year. - PET scan (08/06/2022): No evidence of metastatic disease. - Opdivo for 1 year started on 08/27/2022  Plan: 1.  Stage IV colon cancer to the right axillary lymph node: - PET scan on 11/19/2022: No evidence of recurrence.   2.  Right breast invasive lobular carcinoma, grade 2: - Mammogram on 03/31/2022, BI-RADS Category 1. - Continue anastrozole daily.   3.  Osteoporosis: - Continue vitamin D 50,000 units weekly.   4.  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 - No immunotherapy related side effects from nivolumab. - Reviewed labs today which showed normal LFTs.  Creatinine is 2.50.  CBC was grossly normal.  TSH is 6.2. - She will proceed with nivolumab today.  She will also receive final mL normal saline for elevated creatinine.  RTC 4 weeks for follow-up.  Will plan to repeat CT CAP with oral contrast only.   5.  Hypothyroidism: - TSH is 6.22.  She has not taken Synthroid in the last 3 days as she ran out.  Will increase Synthroid to 50 mcg daily.  I have sent a  new prescription.  No orders of the defined types were placed in this encounter.     [name],acting as a scribe for Derek Jack, MD.,have documented all relevant documentation on the  behalf of Derek Jack, MD,as directed by  Derek Jack, MD while in the presence of Derek Jack, MD.   I, Derek Jack MD, have reviewed the above documentation for accuracy and completeness, and I agree with the above.   [name]   2/26/20249:41 AM  CHIEF COMPLAINT:   Diagnosis: stage I right breast cancer and colon cancer and left kidney urothelial carcinoma    Cancer Staging  Cancer of ascending colon s/p robotic proximal colectomy 10/11/2019 Staging form: Colon and Rectum, AJCC 8th Edition - Clinical stage from 10/23/2019: Stage IVA (cT3, cN1c, cM1a) - Signed by Derek Jack, MD on 12/14/2019  Malignant neoplasm of right female breast [facility]) Staging form: Breast, AJCC 8th Edition - Clinical stage from 04/23/2020: Stage IA (cT1c, cN0(sn), cM0, G2, ER+, PR+, HER2-) - Signed by Derek Jack, MD on 04/23/2020  Urothelial carcinoma of kidney, left [facility]) Staging form: Kidney, AJCC 8th Edition - Clinical stage from 08/18/2022: Stage III (cT3, cN0, cM0) - Unsigned    Prior Therapy: 1. Robotic proximal colectomy on 10/11/2019. 2. Right breast lumpectomy and SLNB on 04/02/2020 3.  Left radical nephro ureterectomy, lymphadenectomy, bladder cuff excision on 07/13/2022  Current Therapy:  Opdivo every 4 weeks     HISTORY OF PRESENT ILLNESS:   Oncology History  Cancer of ascending colon s/p robotic proximal colectomy 10/11/2019  10/11/2019 Initial Diagnosis   Cancer of ascending colon s/p robotic proximal colectomy 10/11/2019   10/23/2019 Cancer Staging   Staging form: Colon and Rectum, AJCC 8th Edition - Clinical stage from 10/23/2019: Stage IVA (cT3, cN1c, cM1a) - Signed by Derek Jack, MD on 12/14/2019   05/14/2020 - 06/26/2022 Chemotherapy   Patient is on Treatment Plan : COLORECTAL Pembrolizumab q21d     05/14/2020 - 06/26/2022 Chemotherapy   Patient is on Treatment Plan : COLORECTAL Pembrolizumab (200) q21d     08/27/2022 -   Chemotherapy   Patient is on Treatment Plan : COLORECTAL Nivolumab (480) q28d     Malignant neoplasm of right female breast ([place])  04/23/2020 Initial Diagnosis   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 ([place])   04/23/2020 Cancer Staging   Staging form: Breast, AJCC 8th Edition - Clinical stage from 04/23/2020: Stage IA (cT1c, cN0(sn), cM0, G2, ER+, PR+, HER2-) - Signed by Derek Jack, MD on 04/23/2020   Urothelial carcinoma of kidney, left ([place])  08/18/2022 Initial Diagnosis   Urothelial carcinoma of kidney, left ([place])   08/27/2022 -  Chemotherapy   Patient is on Treatment Plan : COLORECTAL Nivolumab (83) q28d        INTERVAL HISTORY:   Jessica Taylor is a 82 y.o. female presenting to clinic today for follow up of stage I right breast cancer and colon cancer and left kidney urothelial carcinoma . She was last seen by me on 12/24/2022.  Today, she states that she is doing well overall. Her appetite level is at 50-60%. Her energy level is at 40%. She denies any recent infections, diarrhea, and skin rashes. She currently is still taking levothyroxine 25 MCG. She hasn't taken it in 2-3 days.    PAST MEDICAL HISTORY:   Past Medical History: Past Medical History:  Diagnosis Date   Abdominal aortic aneurysm (HCC)    Acid reflux    Anemia    Arthritis  knees , R shoulder - tx /w injection - 11/2014   Basal cell carcinoma (BCC) of dorsum of nose 2010   Resolved   Cancer ([place])    basal cell on nose   Colon cancer (HCC)    Coronary artery disease    Hypercholesteremia    Hypertension    Hypothyroidism    Lt Acute pyelonephritis 09/11/2018   MI, old 2000   Peripheral arterial disease ([place])    hhigh-grade ostial bilateral calcified iliac stenosis with claudication   Tobacco abuse    Vertigo    when lays on left side.    Surgical History: Past Surgical History:  Procedure Laterality Date   BREAST LUMPECTOMY WITH RADIOACTIVE SEED AND SENTINEL LYMPH NODE BIOPSY  Bilateral 04/02/2020   Procedure: BILATERAL BREAST LUMPECTOMY WITH RADIOACTIVE SEED AND RIGHT SENTINEL LYMPH NODE BIOPSY AND RIGHT TARGETED AXILLARY LYMPH NODE BIOPSY;  Surgeon: Erroll Luna, MD;  Location: [place];  Service: General;  Laterality: Bilateral;   CARDIAC CATHETERIZATION  5 stents   CARDIAC CATHETERIZATION N/A 01/20/2016   Procedure: Left Heart Cath and Coronary Angiography;  Surgeon: Peter M Martinique, MD;  Location: [place] [facility];  Service: Cardiovascular;  Laterality: N/A;   CATARACT EXTRACTION W/PHACO Left 05/24/2014   Procedure: CATARACT EXTRACTION PHACO AND INTRAOCULAR LENS PLACEMENT (IOC);  Surgeon: Tonny Branch, MD;  Location: [facility];  Service: Ophthalmology;  Laterality: Left;  CDE:  9.30   CATARACT EXTRACTION W/PHACO Right 06/18/2014   Procedure: CATARACT EXTRACTION PHACO AND INTRAOCULAR LENS PLACEMENT RIGHT EYE CDE=10.84;  Surgeon: Tonny Branch, MD;  Location: [facility];  Service: Ophthalmology;  Laterality: Right;   CORONARY ARTERY BYPASS GRAFT N/A 01/24/2016   Procedure: CORONARY ARTERY BYPASS GRAFTING (CABG);  Surgeon: Gaye Pollack, MD;  Location: [place];  Service: Open Heart Surgery;  Laterality: N/A;   CORONARY STENT PLACEMENT     CORONARY STENT PLACEMENT  03/06/15   CFX DES   CYSTOSCOPY/URETEROSCOPY/HOLMIUM LASER/STENT PLACEMENT Left 09/12/2018   Procedure: CYSTOSCOPY/URETEROSCOPY/STENT PLACEMENT;  Surgeon: Ceasar Mons, MD;  Location: [facility];  Service: Urology;  Laterality: Left;   EYE SURGERY     LEFT HEART CATH AND CORS/GRAFTS ANGIOGRAPHY N/A 09/12/2019   Procedure: LEFT HEART CATH AND CORS/GRAFTS ANGIOGRAPHY;  Surgeon: Troy Sine, MD;  Location: [place] [facility];  Service: Cardiovascular;  Laterality: N/A;   LEFT HEART CATHETERIZATION WITH CORONARY ANGIOGRAM N/A 03/06/2015   Procedure: LEFT HEART CATHETERIZATION WITH CORONARY ANGIOGRAM;  Surgeon: Lorretta Harp, MD;  Location: [facility] C[facility];  Service: Cardiovascular;  Laterality: N/A;    PARTIAL KNEE ARTHROPLASTY Right 02/04/2015   Procedure: UNICOMPARTMENTAL KNEE;  Surgeon: Dorna Leitz, MD;  Location: [place];  Service: Orthopedics;  Laterality: Right;   PERIPHERAL VASCULAR CATHETERIZATION Bilateral 05/13/2015   Procedure: Lower Extremity Angiography;  Surgeon: Lorretta Harp, MD;  Location: [place] [facility];  Service: Cardiovascular;  Laterality: Bilateral;   PERIPHERAL VASCULAR CATHETERIZATION N/A 05/13/2015   Procedure: Abdominal Aortogram;  Surgeon: Lorretta Harp, MD;  Location: [place] [facility];  Service: Cardiovascular;  Laterality: N/A;   PERIPHERAL VASCULAR CATHETERIZATION Bilateral 06/27/2015   Procedure: Peripheral Vascular Intervention;  Surgeon: Lorretta Harp, MD;  Location: [place] [facility];  Service: Cardiovascular;  Laterality: Bilateral;  ILIACS   PERIPHERAL VASCULAR CATHETERIZATION Bilateral 06/27/2015   Procedure: Peripheral Vascular Atherectomy;  Surgeon: Lorretta Harp, MD;  Location: [place] [facility];  Service: Cardiovascular;  Laterality: Bilateral;   PORTACATH PLACEMENT Left 05/24/2020  Procedure: INSERTION PORT-A-CATH;  Surgeon: Aviva Signs, MD;  [facility];  Service: General;  Laterality: Left;   TEE WITHOUT CARDIOVERSION N/A 01/24/2016   Procedure: TRANSESOPHAGEAL ECHOCARDIOGRAM (TEE);  Surgeon: Gaye Pollack, MD;  Location: [place];  Service: Open Heart Surgery;  Laterality: N/A;   TONSILLECTOMY     age 44   TUBAL LIGATION      Social History: Social History   Socioeconomic History   Marital status: Married    Spouse name: Tommy   Number of children: 4   Years of education: Not on file   Highest education level: Not on file  Occupational History   Occupation: retired    Comment: worked as Presenter, broadcasting for [employer]  Tobacco Use   Smoking status: Former    Packs/day: 1.50    Years: 42.00    Total pack years: 63.00    Types: Cigarettes    Quit date: 05/19/1999    Years since quitting: 23.6   Smokeless  tobacco: Never  Vaping Use   Vaping Use: Never used  Substance and Sexual Activity   Alcohol use: No   Drug use: No   Sexual activity: Yes    Birth control/protection: Surgical  Other Topics Concern   Not on file  Social History Narrative   Not on file   Social Determinants of Health   Financial Resource Strain: [place] (02/18/2021)   Overall Financial Resource Strain (CARDIA)    Difficulty of Paying Living Expenses: Somewhat hard  Food Insecurity: No Food Insecurity (02/18/2021)   Hunger Vital Sign    Worried About Running Out of Food in the Last Year: Never true    [place] in the Last Year: Never true  Transportation Needs: No Transportation Needs (02/18/2021)   PRAPARE - Hydrologist (Medical): No    Lack of Transportation (Non-Medical): No  Physical Activity: Inactive (02/18/2021)   Exercise Vital Sign    Days of Exercise per Week: 0 days    Minutes of Exercise per Session: 0 min  Stress: No Stress Concern Present (02/18/2021)   [place]    Feeling of Stress : Not at all  Social Connections: Moderately Isolated (02/18/2021)   Social Connection and Isolation Panel [NHANES]    Frequency of Communication with Friends and Family: More than three times a week    Frequency of Social Gatherings with Friends and Family: Three times a week    Attends Religious Services: Never    Active Member of Clubs or Organizations: No    Attends Archivist Meetings: Never    Marital Status: Married  Human resources officer Violence: Not At Risk (02/18/2021)   Humiliation, Afraid, Rape, and Kick questionnaire    Fear of Current or Ex-Partner: No    Emotionally Abused: No    Physically Abused: No    Sexually Abused: No    Family History: Family History  Problem Relation Age of Onset   Ovarian cancer Mother 32   Cancer Father 33       unsure of which kind, "it was in his glands"    Hypertension Maternal Grandmother    Stroke Maternal Grandfather    Hypertension Son    Brain cancer Sister    Hypertension Daughter    Heart attack Neg Hx    Colon cancer Neg Hx    Esophageal cancer Neg Hx    Inflammatory bowel disease Neg  Hx    Liver disease Neg Hx    Pancreatic cancer Neg Hx    Rectal cancer Neg Hx    Stomach cancer Neg Hx     Current Medications:  Current Outpatient Medications:    amLODipine (NORVASC) 5 MG tablet, TAKE 1 TABLET BY MOUTH ONCE A DAY., Disp: 30 tablet, Rfl: 4   anastrozole (ARIMIDEX) 1 MG tablet, TAKE 1 TABLET BY MOUTH ONCE DAILY., Disp: 30 tablet, Rfl: 0   aspirin EC 81 MG tablet, Take 81 mg by mouth every evening. , Disp: , Rfl:    atorvastatin (LIPITOR) 40 MG tablet, TAKE (1) TABLET BY MOUTH ONCE DAILY. KEEP FUTURE APPOINTMENTS FOR ANY FURTHER REFILLS., Disp: 90 tablet, Rfl: 3   celecoxib (CELEBREX) 100 MG capsule, Take 100 mg by mouth 2 (two) times daily., Disp: , Rfl:    clopidogrel (PLAVIX) 75 MG tablet, Take 1 tablet (75 mg total) by mouth daily. FILLS., Disp: 90 tablet, Rfl: 3   esomeprazole (NEXIUM) 20 MG capsule, Take 20 mg by mouth daily. , Disp: , Rfl:    HYDROcodone-acetaminophen (NORCO/VICODIN) 5-325 MG tablet, Take 1 tablet by mouth daily., Disp: 15 tablet, Rfl: 0   hydrOXYzine (ATARAX) 25 MG tablet, TAKE (1) TABLET BY MOUTH AT BEDTIME AS NEEDED, Disp: 30 tablet, Rfl: 0   levothyroxine (SYNTHROID) 25 MCG tablet, Take 25 mcg by mouth daily., Disp: , Rfl:    metoprolol tartrate (LOPRESSOR) 25 MG tablet, TAKE 1 TABLET BY MOUTH TWICE DAILY., Disp: 160 tablet, Rfl: 3   nitroGLYCERIN (NITROSTAT) 0.4 MG SL tablet, DISSOLVE 1 TABLET SUBLINGUALLY AS NEEDED FOR CHEST PAIN, MAY REPEAT EVERY 5 MINUTES. AFTER 3 CALL 911. (Patient not taking: Reported on 12/24/2022), Disp: 25 tablet, Rfl: 4   tamsulosin (FLOMAX) 0.4 MG CAPS capsule, Take 0.4 mg by mouth daily., Disp: , Rfl:    Vitamin D, Ergocalciferol, (DRISDOL) 1.25 MG (50000 UNIT) CAPS capsule,  TAKE 1 CAPSULE BY MOUTH ONCE A WEEK., Disp: 4 capsule, Rfl: 0 No current facility-administered medications for this visit.  Facility-Administered Medications Ordered in Other Visits:    sodium chloride flush (NS) 0.9 % injection 10 mL, 10 mL, Intracatheter, PRN, Derek Jack, MD, 10 mL at 11/26/22 1631   Allergies: Allergies  Allergen Reactions   Crab [Shellfish Allergy] Nausea And Vomiting    Throws up violently   Ezetimibe Diarrhea   Keflex [Cephalexin]     Caused sores   Other Nausea And Vomiting    Shrimp    REVIEW OF SYSTEMS:   Review of Systems  Constitutional:  Negative for chills, fatigue and fever.  HENT:   Negative for lump/mass, mouth sores, nosebleeds, sore throat and trouble swallowing.   Eyes:  Negative for eye problems.  Respiratory:  Negative for cough and shortness of breath.   Cardiovascular:  Negative for chest pain, leg swelling and palpitations.  Gastrointestinal:  Negative for abdominal pain, constipation, diarrhea, nausea and vomiting.  Genitourinary:  Negative for bladder incontinence, difficulty urinating, dysuria, frequency, hematuria and nocturia.   Musculoskeletal:  Negative for arthralgias, back pain, flank pain, myalgias and neck pain.  Skin:  Negative for itching and rash.  Neurological:  Negative for dizziness, headaches and numbness.  Hematological:  Does not bruise/bleed easily.  Psychiatric/Behavioral:  Negative for depression, sleep disturbance and suicidal ideas. The patient is not nervous/anxious.   All other systems reviewed and are negative.    VITALS:   There were no vitals taken for this visit.  Wt Readings from Last 3 Encounters:  12/24/22 150 lb 9.6 oz (68.3 kg)  11/26/22 148 lb 1.6 oz (67.2 kg)  10/29/22 149 lb 0.5 oz (67.6 kg)    There is no height or weight on file to calculate BMI.  Performance status (ECOG): 1 - Symptomatic but completely ambulatory  PHYSICAL EXAM:   Physical Exam Vitals and nursing note  reviewed. Exam conducted with a chaperone present.  Constitutional:      Appearance: Normal appearance.  Cardiovascular:     Rate and Rhythm: Normal rate and regular rhythm.     Pulses: Normal pulses.     Heart sounds: Normal heart sounds.  Pulmonary:     Effort: Pulmonary effort is normal.     Breath sounds: Normal breath sounds.  Abdominal:     Palpations: Abdomen is soft. There is no hepatomegaly, splenomegaly or mass.     Tenderness: There is no abdominal tenderness.  Musculoskeletal:     Right lower leg: No edema.     Left lower leg: No edema.  Lymphadenopathy:     Cervical: No cervical adenopathy.     Right cervical: No superficial, deep or posterior cervical adenopathy.    Left cervical: No superficial, deep or posterior cervical adenopathy.     Upper Body:     Right upper body: No supraclavicular or axillary adenopathy.     Left upper body: No supraclavicular or axillary adenopathy.  Neurological:     General: No focal deficit present.     Mental Status: She is alert and oriented to person, place, and time.  Psychiatric:        Mood and Affect: Mood normal.        Behavior: Behavior normal.     LABS:      Latest Ref Rng & Units 12/24/2022   10:40 AM 11/26/2022   12:03 PM 10/29/2022   12:43 PM  CBC  WBC 4.0 - 10.5 K/uL 4.7  5.9  4.7   Hemoglobin 12.0 - 15.0 g/dL 13.2  13.1  12.6   Hematocrit 36.0 - 46.0 % 40.6  41.0  39.5   Platelets 150 - 400 K/uL 157  174  170       Latest Ref Rng & Units 12/24/2022   10:40 AM 11/26/2022   12:03 PM 10/29/2022   12:43 PM  CMP  Glucose 70 - 99 mg/dL 99  97  98   BUN 8 - 23 mg/dL '27  27  26   '$ Creatinine 0.44 - 1.00 mg/dL 2.45  2.40  2.30   Sodium 135 - 145 mmol/L 137  137  136   Potassium 3.5 - 5.1 mmol/L 3.9  3.7  4.0   Chloride 98 - 111 mmol/L 105  106  106   CO2 22 - 32 mmol/L '21  23  22   '$ Calcium 8.9 - 10.3 mg/dL 9.6  9.4  9.4   Total Protein 6.5 - 8.1 g/dL 6.7  6.8  6.5   Total Bilirubin 0.3 - 1.2 mg/dL 0.8  0.9  0.8    Alkaline Phos 38 - 126 U/L 73  82  75   AST 15 - 41 U/L '18  20  18   '$ ALT 0 - 44 U/L '12  11  13      '$ Lab Results  Component Value Date   CEA1 11.4 (H) 11/26/2022   CEA 39.5 (H) 08/09/2019   /  CEA  Date Value Ref Range Status  11/26/2022 11.4 (H) 0.0 - 4.7 ng/mL Final  Comment:    (NOTE)                             Nonsmokers          <3.9                             Smokers             <5.6 Roche Diagnostics Electrochemiluminescence Immunoassay (ECLIA) Values obtained with different assay methods or kits cannot be used interchangeably.  Results cannot be interpreted as absolute evidence of the presence or absence of malignant disease. Performed At: [facility] [place], [place] [id number] Rush Farmer MD [id number]   08/09/2019 39.5 (H) ng/mL Final    Comment:    Non-Smoker: <2.5 Smoker:     <5.0 . . This test was performed using the Siemens  chemiluminescent method. Values obtained from different assay methods cannot be used interchangeably. CEA levels, regardless of value, should not be interpreted as absolute evidence of the presence or absence of disease. .    No results found for: "PSA1" No results found for: "[id number]" No results found for: "CAN125"  No results found for: "TOTALPROTELP", "ALBUMINELP", "A1GS", "A2GS", "BETS", "BETA2SER", "GAMS", "MSPIKE", "SPEI" Lab Results  Component Value Date   TIBC 357 06/04/2022   TIBC 269 11/22/2019   TIBC 271 10/23/2019   FERRITIN 10 (L) 06/04/2022   FERRITIN 230 11/22/2019   FERRITIN 47 10/23/2019   IRONPCTSAT 9 (L) 06/04/2022   IRONPCTSAT 31 11/22/2019   IRONPCTSAT 11 10/23/2019   No results found for: "LDH"   STUDIES:   No results found.

## 2023-01-18 NOTE — Progress Notes (Signed)
Patient is taking Anastrozole as prescribed.  She has not missed any doses and reports no side effects at this time.

## 2023-01-19 LAB — T4: T4, Total: 4.9 ug/dL (ref 4.5–12.0)

## 2023-01-19 LAB — CEA: CEA: 12.4 ng/mL — ABNORMAL HIGH (ref 0.0–4.7)

## 2023-02-01 ENCOUNTER — Other Ambulatory Visit: Payer: Self-pay | Admitting: Hematology

## 2023-02-01 ENCOUNTER — Other Ambulatory Visit: Payer: Self-pay | Admitting: *Deleted

## 2023-02-01 DIAGNOSIS — C50911 Malignant neoplasm of unspecified site of right female breast: Secondary | ICD-10-CM

## 2023-02-01 NOTE — Telephone Encounter (Signed)
Anastrozole refill approved.  Patient is tolerating and is to continue therapy.  

## 2023-02-08 ENCOUNTER — Ambulatory Visit (HOSPITAL_COMMUNITY)
Admission: RE | Admit: 2023-02-08 | Discharge: 2023-02-08 | Disposition: A | Discharge status: Home / Self Care | Payer: Medicare Other | Source: Ambulatory Visit | Attending: Hematology | Admitting: Hematology

## 2023-02-08 DIAGNOSIS — C642 Malignant neoplasm of left kidney, except renal pelvis: Secondary | ICD-10-CM | POA: Insufficient documentation

## 2023-02-08 DIAGNOSIS — K5730 Diverticulosis of large intestine without perforation or abscess without bleeding: Secondary | ICD-10-CM | POA: Diagnosis not present

## 2023-02-08 DIAGNOSIS — C182 Malignant neoplasm of ascending colon: Secondary | ICD-10-CM | POA: Insufficient documentation

## 2023-02-08 DIAGNOSIS — K8020 Calculus of gallbladder without cholecystitis without obstruction: Secondary | ICD-10-CM | POA: Diagnosis not present

## 2023-02-08 DIAGNOSIS — R918 Other nonspecific abnormal finding of lung field: Secondary | ICD-10-CM | POA: Diagnosis not present

## 2023-02-08 MED ORDER — IOHEXOL 9 MG/ML PO SOLN
ORAL | Status: AC
  Filled 2023-02-08: qty 1000

## 2023-02-10 ENCOUNTER — Other Ambulatory Visit: Payer: Self-pay | Admitting: Hematology

## 2023-02-15 ENCOUNTER — Inpatient Hospital Stay: Payer: Medicare Other

## 2023-02-15 ENCOUNTER — Inpatient Hospital Stay: Payer: Medicare Other | Attending: Hematology

## 2023-02-15 ENCOUNTER — Encounter: Payer: Self-pay | Admitting: *Deleted

## 2023-02-15 ENCOUNTER — Inpatient Hospital Stay (HOSPITAL_BASED_OUTPATIENT_CLINIC_OR_DEPARTMENT_OTHER): Payer: Medicare Other | Admitting: Hematology

## 2023-02-15 VITALS — BP 123/70 | HR 66 | Temp 97.3°F | Resp 16 | Wt 154.8 lb

## 2023-02-15 DIAGNOSIS — C182 Malignant neoplasm of ascending colon: Secondary | ICD-10-CM | POA: Insufficient documentation

## 2023-02-15 DIAGNOSIS — E039 Hypothyroidism, unspecified: Secondary | ICD-10-CM | POA: Insufficient documentation

## 2023-02-15 DIAGNOSIS — C50911 Malignant neoplasm of unspecified site of right female breast: Secondary | ICD-10-CM | POA: Diagnosis not present

## 2023-02-15 DIAGNOSIS — C773 Secondary and unspecified malignant neoplasm of axilla and upper limb lymph nodes: Secondary | ICD-10-CM | POA: Insufficient documentation

## 2023-02-15 DIAGNOSIS — Z8041 Family history of malignant neoplasm of ovary: Secondary | ICD-10-CM | POA: Diagnosis not present

## 2023-02-15 DIAGNOSIS — Z79811 Long term (current) use of aromatase inhibitors: Secondary | ICD-10-CM | POA: Insufficient documentation

## 2023-02-15 DIAGNOSIS — R21 Rash and other nonspecific skin eruption: Secondary | ICD-10-CM | POA: Diagnosis not present

## 2023-02-15 DIAGNOSIS — Z170 Estrogen receptor positive status [ER+]: Secondary | ICD-10-CM | POA: Insufficient documentation

## 2023-02-15 DIAGNOSIS — C642 Malignant neoplasm of left kidney, except renal pelvis: Secondary | ICD-10-CM

## 2023-02-15 DIAGNOSIS — Z79899 Other long term (current) drug therapy: Secondary | ICD-10-CM | POA: Insufficient documentation

## 2023-02-15 DIAGNOSIS — Z95828 Presence of other vascular implants and grafts: Secondary | ICD-10-CM

## 2023-02-15 DIAGNOSIS — Z87891 Personal history of nicotine dependence: Secondary | ICD-10-CM | POA: Insufficient documentation

## 2023-02-15 LAB — COMPREHENSIVE METABOLIC PANEL
ALT: 15 U/L (ref 0–44)
AST: 19 U/L (ref 15–41)
Albumin: 3.9 g/dL (ref 3.5–5.0)
Alkaline Phosphatase: 80 U/L (ref 38–126)
Anion gap: 10 (ref 5–15)
BUN: 30 mg/dL — ABNORMAL HIGH (ref 8–23)
CO2: 22 mmol/L (ref 22–32)
Calcium: 9.5 mg/dL (ref 8.9–10.3)
Chloride: 109 mmol/L (ref 98–111)
Creatinine, Ser: 2.49 mg/dL — ABNORMAL HIGH (ref 0.44–1.00)
GFR, Estimated: 19 mL/min — ABNORMAL LOW (ref >60)
Glucose, Bld: 132 mg/dL — ABNORMAL HIGH (ref 70–99)
Potassium: 3.8 mmol/L (ref 3.5–5.1)
Sodium: 141 mmol/L (ref 135–145)
Total Bilirubin: 0.6 mg/dL (ref 0.3–1.2)
Total Protein: 6.6 g/dL (ref 6.5–8.1)

## 2023-02-15 LAB — CBC WITH DIFFERENTIAL/PLATELET
Abs Immature Granulocytes: 0.02 10*3/uL (ref 0.00–0.07)
Basophils Absolute: 0.1 10*3/uL (ref 0.0–0.1)
Basophils Relative: 1 %
Eosinophils Absolute: 0.1 10*3/uL (ref 0.0–0.5)
Eosinophils Relative: 2 %
HCT: 41 % (ref 36.0–46.0)
Hemoglobin: 13.1 g/dL (ref 12.0–15.0)
Immature Granulocytes: 0 %
Lymphocytes Relative: 23 %
Lymphs Abs: 1.2 10*3/uL (ref 0.7–4.0)
MCH: 29.4 pg (ref 26.0–34.0)
MCHC: 32 g/dL (ref 30.0–36.0)
MCV: 91.9 fL (ref 80.0–100.0)
Monocytes Absolute: 0.3 10*3/uL (ref 0.1–1.0)
Monocytes Relative: 6 %
Neutro Abs: 3.5 10*3/uL (ref 1.7–7.7)
Neutrophils Relative %: 68 %
Platelets: 163 10*3/uL (ref 150–400)
RBC: 4.46 MIL/uL (ref 3.87–5.11)
RDW: 13.3 % (ref 11.5–15.5)
WBC: 5.2 10*3/uL (ref 4.0–10.5)
nRBC: 0 % (ref 0.0–0.2)

## 2023-02-15 LAB — TSH: TSH: 1.839 u[IU]/mL (ref 0.350–4.500)

## 2023-02-15 MED ORDER — BETAMETHASONE DIPROPIONATE 0.05 % EX CREA: TOPICAL_CREAM | Freq: Two times a day (BID) | CUTANEOUS | 0 refills | Status: DC

## 2023-02-15 MED ORDER — HEPARIN SOD (PORK) LOCK FLUSH 100 UNIT/ML IV SOLN
500.0000 [IU] | Freq: Once | INTRAVENOUS | Status: AC
  Administered 2023-02-15: 500 [IU] via INTRAVENOUS

## 2023-02-15 MED ORDER — SODIUM CHLORIDE 0.9% FLUSH
10.0000 mL | INTRAVENOUS | Status: DC | PRN
  Administered 2023-02-15: 10 mL via INTRAVENOUS

## 2023-02-15 MED ORDER — SODIUM CHLORIDE 0.9% FLUSH
10.0000 mL | Freq: Once | INTRAVENOUS | Status: AC
  Administered 2023-02-15: 10 mL via INTRAVENOUS

## 2023-02-15 NOTE — Progress Notes (Deleted)
Patient has been examined by Dr. Katragadda. Vital signs and labs have been reviewed by MD - ANC, Creatinine, LFTs, hemoglobin, and platelets are within treatment parameters per M.D. - pt may proceed with treatment.  Primary RN and pharmacy notified.  

## 2023-02-15 NOTE — Progress Notes (Signed)
Pt's treatment will be held treatment today due to blistering rash developing per Dr.K.

## 2023-02-15 NOTE — Patient Instructions (Addendum)
[place] at [facility] Discharge Instructions   You were seen and examined today by Dr. Delton Coombes.  He reviewed the results of your lab work which are normal/stable.   He reviewed the results of your CT scan which is stable. No new evidence of cancer was seen on this exam.   We will hold your treatment today due to the rash on your chest.   Return as scheduled.    Thank you for choosing [place] at [facility] to provide your oncology and hematology care.  To afford each patient quality time with our provider, please arrive at least 15 minutes before your scheduled appointment time.   If you have a lab appointment with the [facility] please come in thru the Main Entrance and check in at the main information desk.  You need to re-schedule your appointment should you arrive 10 or more minutes late.  We strive to give you quality time with our providers, and arriving late affects you and other patients whose appointments are after yours.  Also, if you no show three or more times for appointments you may be dismissed from the clinic at the providers discretion.     Again, thank you for choosing [facility].  Our hope is that these requests will decrease the amount of time that you wait before being seen by our physicians.       _____________________________________________________________  Should you have questions after your visit to [facility], please contact our office at [phone] and follow the prompts.  Our office hours are 8:00 a.m. and 4:30 p.m. Monday - Friday.  Please note that voicemails left after 4:00 p.m. may not be returned until the following business day.  We are closed weekends and major holidays.  You do have access to a nurse 24-7, just call the main number to the clinic [phone] and do not press any options, hold on the line and a nurse will answer the phone.    For prescription refill  requests, have your pharmacy contact our office and allow 72 hours.    Due to Covid, you will need to wear a mask upon entering the hospital. If you do not have a mask, a mask will be given to you at the Main Entrance upon arrival. For doctor visits, patients may have 1 support person age 50 or older with them. For treatment visits, patients can not have anyone with them due to social distancing guidelines and our immunocompromised population.

## 2023-02-15 NOTE — Progress Notes (Signed)
[place] [address] [place] [address]    Clinic Day:  02/15/2023  Referring physician: Antony Contras, MD  Patient Care Team: Antony Contras, MD as PCP - General (Family Medicine) Jerline Pain, MD as PCP - Cardiology (Cardiology) Michael Boston, MD as Consulting Physician (General Surgery) Jerline Pain, MD as Consulting Physician (Cardiology) Mansouraty, Telford Nab., MD as Consulting Physician (Gastroenterology) Donetta Potts, RN as Oncology Nurse Navigator Derek Jack, MD as Consulting Physician (Medical Oncology)   ASSESSMENT & PLAN:   Assessment: 1.  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  Right hemicolectomy on 10/11/2019, grade 3 adenocarcinoma, negative margins, positive LVSI, 2 tumor deposits, 0/15 lymph nodes positive, PT3PN1C. 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  MLH1 hyper methylation present. -PET scan in December 2020 showed lymph node in the right axillary region.  Biopsy consistent with metastatic colon cancer. -Last CEA was 10.7 on 11/22/2019. -Right axillary lymph node excision on 04/02/2020 consistent with metastatic carcinoma from colon cancer. -Pembrolizumab started on 05/14/2020. -PET scan on 08/05/2020 shows complete resolution of lymphadenopathy.  No evidence of new areas of uptake.  Mild vague residual hypermetabolism in the right axilla without soft tissue mass. -PET scan on 12/23/2020 with no evidence of recurrence. Beryle Flock held since we started opdivo on 08/27/2022   2.  Stage I (PT1CPN0) right Breast, Grade 2 Invasive Lobular Carcinoma: -MRI of the breast on 12/25/2019 showed 1 cm mass behind the right areola with a suspicious mass in the left breast. -Right breast lumpectomy on 04/02/2020 shows invasive lobular carcinoma, grade 2, 1.2 cm.  Resection margins are negative.  Negative LVSI.  2 sentinel lymph nodes  were negative for carcinoma.  ER/PR 100% positive, HER-2 negative, Ki-67 10%.   3.  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 - Based on her renal function, she is not a candidate for cisplatin based chemotherapy.  Nivolumab was recommended for 1 year. - PET scan (08/06/2022): No evidence of metastatic disease. - Opdivo for 1 year started on 08/27/2022  Plan: 1.  Stage IV colon cancer to the right axillary lymph node: - PET scan on 11/19/2022: No evidence of recurrence.   2.  Right breast invasive lobular carcinoma, grade 2: - Last mammogram in May 2023, BI-RADS Category 1. - Continue anastrozole daily.   3.  Blistering rash: - She has developed left inframammary area blistering rash for the last 1 week.  Differential diagnosis includes bullous pemphigoid. - Will hold immunotherapy today.  Will give her betamethasone dipropionate ointment to be used twice daily.  Will reevaluate in 2 weeks.  She will call us if there is any worsening of the rash.   4.  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  Back pain is better today. - Reviewed labs today which showed creatinine 2.49.  Creatinine is around 2.5 since nephrectomy.  CBC was normal. - Because of the rash, I have recommended holding treatment. - [facility] nephrologist at [facility] is recommending her to attend  dialysis class.  She does not want to travel back and forth and would prefer to be done locally.  I will make referral to Dr. Theador Hawthorne.   5.   Hypothyroidism: - TSH today improved to 1.8 from 6.2.  Continue Synthroid 50 mcg daily.  No orders of the defined types were placed in this encounter.    I,Alexis Herring,acting as a Education administrator for [employer], MD.,have documented all relevant documentation on the behalf of Derek Jack, MD,as directed by  Derek Jack, MD while in the presence of Derek Jack, MD.  I, Derek Jack MD, have reviewed the above documentation for accuracy and completeness, and I agree with the above.    Derek Jack, MD   3/25/202412:06 PM  CHIEF COMPLAINT:   Diagnosis: stage I right breast cancer and colon cancer and left kidney urothelial carcinoma    Cancer Staging  Cancer of ascending colon s/p robotic proximal colectomy 10/11/2019 Staging form: Colon and Rectum, AJCC 8th Edition - Clinical stage from 10/23/2019: Stage IVA (cT3, cN1c, cM1a) - Signed by Derek Jack, MD on 12/14/2019  Malignant neoplasm of right female breast [facility]) Staging form: Breast, AJCC 8th Edition - Clinical stage from 04/23/2020: Stage IA (cT1c, cN0(sn), cM0, G2, ER+, PR+, HER2-) - Signed by Derek Jack, MD on 04/23/2020  Urothelial carcinoma of kidney, left [facility]) Staging form: Kidney, AJCC 8th Edition - Clinical stage from 08/18/2022: Stage III (cT3, cN0, cM0) - Unsigned    Prior Therapy: 1. Robotic proximal colectomy on 10/11/2019. 2. Right breast lumpectomy and SLNB on 04/02/2020 3.  Left radical nephro ureterectomy, lymphadenectomy, bladder cuff excision on 07/13/2022  Current Therapy:  Opdivo every 4 weeks   HISTORY OF PRESENT ILLNESS:   Oncology History  Cancer of ascending colon s/p robotic proximal colectomy 10/11/2019  10/11/2019 Initial Diagnosis   Cancer of ascending colon s/p robotic proximal colectomy 10/11/2019   10/23/2019 Cancer Staging   Staging form: Colon and Rectum, AJCC 8th Edition - Clinical stage from 10/23/2019: Stage IVA (cT3, cN1c, cM1a)  - Signed by Derek Jack, MD on 12/14/2019   05/14/2020 - 06/26/2022 Chemotherapy   Patient is on Treatment Plan : COLORECTAL Pembrolizumab q21d     05/14/2020 - 06/26/2022 Chemotherapy   Patient is on Treatment Plan : COLORECTAL Pembrolizumab (200) q21d     08/27/2022 -  Chemotherapy   Patient is on Treatment Plan : COLORECTAL Nivolumab (480) q28d     Malignant neoplasm of right female breast ([place])  04/23/2020 Initial Diagnosis   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 ([place])   04/23/2020 Cancer Staging   Staging form: Breast, AJCC 8th Edition - Clinical stage from 04/23/2020: Stage IA (cT1c, cN0(sn), cM0, G2, ER+, PR+, HER2-) - Signed by Derek Jack, MD on 04/23/2020   Urothelial carcinoma of kidney, left ([place])  08/18/2022 Initial Diagnosis   Urothelial carcinoma of kidney, left ([place])   08/27/2022 -  Chemotherapy   Patient is on Treatment Plan : COLORECTAL Nivolumab (15) q28d        INTERVAL HISTORY:   Jessica Taylor is a 82 y.o. female presenting to clinic today for follow up of stage I right breast cancer and colon cancer and left kidney urothelial carcinoma. She was last seen by me on 01/18/23.  Today, she states that she is doing okay but she has been experiencing knee and midline lower back pain intermittently. Her back pain was severe but has improved significantly today. She denies any recent steroid use. She denies any pain in her  hands or other joints. She feels that he pain is manageable at this time. She reports having a blister in the left inframammary area. She states that it is sore but not particularly painful. She has not applied any topical ointments or steroid creams to the area. She denies any other blisters or rashes. Her appetite level is at 100%. Her energy level is at 40%.  She is taking Synthroid 45mcg daily as prescribed. She reports that she is trying to stay hydrated but primarily drinks tea. She stopped drinking soda.  PAST MEDICAL HISTORY:    Past Medical History: Past Medical History:  Diagnosis Date   Abdominal aortic aneurysm (HCC)    Acid reflux    Anemia    Arthritis    knees , R shoulder - tx /w injection - 11/2014   Basal cell carcinoma (BCC) of dorsum of nose 2010   Resolved   Cancer ([place])    basal cell on nose   Colon cancer (HCC)    Coronary artery disease    Hypercholesteremia    Hypertension    Hypothyroidism    Lt Acute pyelonephritis 09/11/2018   MI, old 2000   Peripheral arterial disease ([place])    hhigh-grade ostial bilateral calcified iliac stenosis with claudication   Tobacco abuse    Vertigo    when lays on left side.    Surgical History: Past Surgical History:  Procedure Laterality Date   BREAST LUMPECTOMY WITH RADIOACTIVE SEED AND SENTINEL LYMPH NODE BIOPSY Bilateral 04/02/2020   Procedure: BILATERAL BREAST LUMPECTOMY WITH RADIOACTIVE SEED AND RIGHT SENTINEL LYMPH NODE BIOPSY AND RIGHT TARGETED AXILLARY LYMPH NODE BIOPSY;  Surgeon: Erroll Luna, MD;  Location: [place];  Service: General;  Laterality: Bilateral;   CARDIAC CATHETERIZATION  5 stents   CARDIAC CATHETERIZATION N/A 01/20/2016   Procedure: Left Heart Cath and Coronary Angiography;  Surgeon: Peter M Martinique, MD;  Location: [place] CV LAB;  Service: Cardiovascular;  Laterality: N/A;   CATARACT EXTRACTION W/PHACO Left 05/24/2014   Procedure: CATARACT EXTRACTION PHACO AND INTRAOCULAR LENS PLACEMENT (IOC);  Surgeon: Tonny Branch, MD;  Location: [facility];  Service: Ophthalmology;  Laterality: Left;  CDE:  9.30   CATARACT EXTRACTION W/PHACO Right 06/18/2014   Procedure: CATARACT EXTRACTION PHACO AND INTRAOCULAR LENS PLACEMENT RIGHT EYE CDE=10.84;  Surgeon: Tonny Branch, MD;  Location: [facility];  Service: Ophthalmology;  Laterality: Right;   CORONARY ARTERY BYPASS GRAFT N/A 01/24/2016   Procedure: CORONARY ARTERY BYPASS GRAFTING (CABG);  Surgeon: Gaye Pollack, MD;  Location: [place];  Service: Open Heart Surgery;  Laterality: N/A;    CORONARY STENT PLACEMENT     CORONARY STENT PLACEMENT  03/06/15   CFX DES   CYSTOSCOPY/URETEROSCOPY/HOLMIUM LASER/STENT PLACEMENT Left 09/12/2018   Procedure: CYSTOSCOPY/URETEROSCOPY/STENT PLACEMENT;  Surgeon: Ceasar Mons, MD;  Location: [facility];  Service: Urology;  Laterality: Left;   EYE SURGERY     LEFT HEART CATH AND CORS/GRAFTS ANGIOGRAPHY N/A 09/12/2019   Procedure: LEFT HEART CATH AND CORS/GRAFTS ANGIOGRAPHY;  Surgeon: Troy Sine, MD;  Location: [place] [facility];  Service: Cardiovascular;  Laterality: N/A;   LEFT HEART CATHETERIZATION WITH CORONARY ANGIOGRAM N/A 03/06/2015   Procedure: LEFT HEART CATHETERIZATION WITH CORONARY ANGIOGRAM;  Surgeon: Lorretta Harp, MD;  Location: [facility] C[facility] LAB;  Service: Cardiovascular;  Laterality: N/A;   PARTIAL KNEE ARTHROPLASTY Right 02/04/2015   Procedure: UNICOMPARTMENTAL KNEE;  Surgeon: Dorna Leitz, MD;  Location: [place];  Service: Orthopedics;  Laterality: Right;   PERIPHERAL  VASCULAR CATHETERIZATION Bilateral 05/13/2015   Procedure: Lower Extremity Angiography;  Surgeon: Lorretta Harp, MD;  Location: [place] [facility];  Service: Cardiovascular;  Laterality: Bilateral;   PERIPHERAL VASCULAR CATHETERIZATION N/A 05/13/2015   Procedure: Abdominal Aortogram;  Surgeon: Lorretta Harp, MD;  Location: [place] CV [facility];  Service: Cardiovascular;  Laterality: N/A;   PERIPHERAL VASCULAR CATHETERIZATION Bilateral 06/27/2015   Procedure: Peripheral Vascular Intervention;  Surgeon: Lorretta Harp, MD;  Location: [place] [facility];  Service: Cardiovascular;  Laterality: Bilateral;  ILIACS   PERIPHERAL VASCULAR CATHETERIZATION Bilateral 06/27/2015   Procedure: Peripheral Vascular Atherectomy;  Surgeon: Lorretta Harp, MD;  Location: [place] [facility];  Service: Cardiovascular;  Laterality: Bilateral;   PORTACATH PLACEMENT Left 05/24/2020   Procedure: INSERTION PORT-A-CATH;  Surgeon: Aviva Signs, MD;  Location: [facility];  Service: General;   Laterality: Left;   TEE WITHOUT CARDIOVERSION N/A 01/24/2016   Procedure: TRANSESOPHAGEAL ECHOCARDIOGRAM (TEE);  Surgeon: Gaye Pollack, MD;  Location: [place];  Service: Open Heart Surgery;  Laterality: N/A;   TONSILLECTOMY     age 61   TUBAL LIGATION      Social History: Social History   Socioeconomic History   Marital status: Married    Spouse name: Tommy   Number of children: 4   Years of education: Not on file   Highest education level: Not on file  Occupational History   Occupation: retired    Comment: worked as Presenter, broadcasting for [employer]  Tobacco Use   Smoking status: Former    Packs/day: 1.50    Years: 42.00    Additional pack years: 0.00    Total pack years: 63.00    Types: Cigarettes    Quit date: 05/19/1999    Years since quitting: 23.7   Smokeless tobacco: Never  Vaping Use   Vaping Use: Never used  Substance and Sexual Activity   Alcohol use: No   Drug use: No   Sexual activity: Yes    Birth control/protection: Surgical  Other Topics Concern   Not on file  Social History Narrative   Not on file   Social Determinants of Health   Financial Resource Strain: [place] (02/18/2021)   Overall Financial Resource Strain (CARDIA)    Difficulty of Paying Living Expenses: Somewhat hard  Food Insecurity: No Food Insecurity (02/18/2021)   Hunger Vital Sign    Worried About Running Out of Food in the Last Year: Never true    [place] in the Last Year: Never true  Transportation Needs: No Transportation Needs (02/18/2021)   PRAPARE - Hydrologist (Medical): No    Lack of Transportation (Non-Medical): No  Physical Activity: Inactive (02/18/2021)   Exercise Vital Sign    Days of Exercise per Week: 0 days    Minutes of Exercise per Session: 0 min  Stress: No Stress Concern Present (02/18/2021)   [place]    Feeling of Stress : Not at all  Social  Connections: Moderately Isolated (02/18/2021)   Social Connection and Isolation Panel [NHANES]    Frequency of Communication with Friends and Family: More than three times a week    Frequency of Social Gatherings with Friends and Family: Three times a week    Attends Religious Services: Never    Active Member of Clubs or Organizations: No    Attends Archivist Meetings: Never    Marital Status: Married  Intimate Partner Violence: Not At Risk (02/18/2021)   Humiliation, Afraid, Rape, and Kick questionnaire    Fear of Current or Ex-Partner: No    Emotionally Abused: No    Physically Abused: No    Sexually Abused: No    Family History: Family History  Problem Relation Age of Onset   Ovarian cancer Mother 39   Cancer Father 62       unsure of which kind, "it was in his glands"   Hypertension Maternal Grandmother    Stroke Maternal Grandfather    Hypertension Son    Brain cancer Sister    Hypertension Daughter    Heart attack Neg Hx    Colon cancer Neg Hx    Esophageal cancer Neg Hx    Inflammatory bowel disease Neg Hx    Liver disease Neg Hx    Pancreatic cancer Neg Hx    Rectal cancer Neg Hx    Stomach cancer Neg Hx     Current Medications:  Current Outpatient Medications:    amLODipine (NORVASC) 5 MG tablet, TAKE 1 TABLET BY MOUTH ONCE A DAY., Disp: 30 tablet, Rfl: 4   anastrozole (ARIMIDEX) 1 MG tablet, TAKE 1 TABLET BY MOUTH ONCE DAILY., Disp: 30 tablet, Rfl: 0   aspirin EC 81 MG tablet, Take 81 mg by mouth every evening. , Disp: , Rfl:    atorvastatin (LIPITOR) 40 MG tablet, TAKE (1) TABLET BY MOUTH ONCE DAILY. KEEP FUTURE APPOINTMENTS FOR ANY FURTHER REFILLS., Disp: 90 tablet, Rfl: 3   betamethasone dipropionate 0.05 % cream, Apply topically 2 (two) times daily., Disp: 30 g, Rfl: 0   clopidogrel (PLAVIX) 75 MG tablet, Take 1 tablet (75 mg total) by mouth daily. FILLS., Disp: 90 tablet, Rfl: 3   esomeprazole (NEXIUM) 20 MG capsule, Take 20 mg by mouth daily. ,  Disp: , Rfl:    HYDROcodone-acetaminophen (NORCO/VICODIN) 5-325 MG tablet, Take 1 tablet by mouth daily., Disp: 15 tablet, Rfl: 0   hydrOXYzine (ATARAX) 25 MG tablet, TAKE (1) TABLET BY MOUTH AT BEDTIME AS NEEDED, Disp: 30 tablet, Rfl: 0   levothyroxine (SYNTHROID) 50 MCG tablet, Take 0.5 tablets (25 mcg total) by mouth daily., Disp: 30 tablet, Rfl: 5   metoprolol tartrate (LOPRESSOR) 25 MG tablet, TAKE 1 TABLET BY MOUTH TWICE DAILY., Disp: 160 tablet, Rfl: 3   tamsulosin (FLOMAX) 0.4 MG CAPS capsule, Take 0.4 mg by mouth daily., Disp: , Rfl:    Vitamin D, Ergocalciferol, (DRISDOL) 1.25 MG (50000 UNIT) CAPS capsule, TAKE 1 CAPSULE BY MOUTH ONCE A WEEK., Disp: 4 capsule, Rfl: 0   nitroGLYCERIN (NITROSTAT) 0.4 MG SL tablet, DISSOLVE 1 TABLET SUBLINGUALLY AS NEEDED FOR CHEST PAIN, MAY REPEAT EVERY 5 MINUTES. AFTER 3 CALL 911. (Patient not taking: Reported on 02/15/2023), Disp: 25 tablet, Rfl: 4 No current facility-administered medications for this visit.  Facility-Administered Medications Ordered in Other Visits:    sodium chloride flush (NS) 0.9 % injection 10 mL, 10 mL, Intracatheter, PRN, Derek Jack, MD, 10 mL at 11/26/22 1631   sodium chloride flush (NS) 0.9 % injection 10 mL, 10 mL, Intravenous, PRN, Derek Jack, MD, 10 mL at 02/15/23 1106   Allergies: Allergies  Allergen Reactions   Crab [Shellfish Allergy] Nausea And Vomiting    Throws up violently   Ezetimibe Diarrhea   Keflex [Cephalexin]     Caused sores   Other Nausea And Vomiting    Shrimp   Shrimp Extract     Other Reaction(s): GI Intolerance  REVIEW OF SYSTEMS:   Review of Systems  Constitutional:  Positive for fatigue. Negative for appetite change, chills and fever.  HENT:   Negative for lump/mass, mouth sores, nosebleeds, sore throat and trouble swallowing.   Eyes:  Negative for eye problems.  Respiratory:  Negative for cough and shortness of breath.   Cardiovascular:  Negative for chest pain, leg  swelling and palpitations.  Gastrointestinal:  Negative for abdominal pain, constipation, diarrhea, nausea and vomiting.  Genitourinary:  Negative for bladder incontinence, difficulty urinating, dysuria, frequency, hematuria and nocturia.   Musculoskeletal:  Positive for arthralgias, back pain and myalgias. Negative for flank pain and neck pain.  Skin:  Negative for itching and rash.       + left inframammary blister  Neurological:  Negative for dizziness, headaches and numbness.  Hematological:  Does not bruise/bleed easily.  Psychiatric/Behavioral:  Negative for depression, sleep disturbance and suicidal ideas. The patient is not nervous/anxious.   All other systems reviewed and are negative.    VITALS:   There were no vitals taken for this visit.  Wt Readings from Last 3 Encounters:  02/15/23 154 lb 12.8 oz (70.2 kg)  01/18/23 154 lb 3.2 oz (69.9 kg)  12/24/22 150 lb 9.6 oz (68.3 kg)    There is no height or weight on file to calculate BMI.  Performance status (ECOG): 1 - Symptomatic but completely ambulatory  PHYSICAL EXAM:   Physical Exam Vitals and nursing note reviewed. Exam conducted with a chaperone present.  Constitutional:      Appearance: Normal appearance.  Cardiovascular:     Rate and Rhythm: Normal rate and regular rhythm.     Pulses: Normal pulses.     Heart sounds: Normal heart sounds.  Pulmonary:     Effort: Pulmonary effort is normal.     Breath sounds: Normal breath sounds.  Abdominal:     Palpations: Abdomen is soft. There is no hepatomegaly, splenomegaly or mass.     Tenderness: There is no abdominal tenderness.  Musculoskeletal:     Right lower leg: No edema.     Left lower leg: No edema.  Lymphadenopathy:     Cervical: No cervical adenopathy.     Right cervical: No superficial, deep or posterior cervical adenopathy.    Left cervical: No superficial, deep or posterior cervical adenopathy.     Upper Body:     Right upper body: No supraclavicular  or axillary adenopathy.     Left upper body: No supraclavicular or axillary adenopathy.  Skin:    Comments: Fluid- filled 2cm blister left inframammary area  Neurological:     General: No focal deficit present.     Mental Status: She is alert and oriented to person, place, and time.  Psychiatric:        Mood and Affect: Mood normal.        Behavior: Behavior normal.     LABS:      Latest Ref Rng & Units 02/15/2023   10:06 AM 01/18/2023   10:40 AM 12/24/2022   10:40 AM  CBC  WBC 4.0 - 10.5 K/uL 5.2  4.3  4.7   Hemoglobin 12.0 - 15.0 g/dL 13.1  13.2  13.2   Hematocrit 36.0 - 46.0 % 41.0  40.9  40.6   Platelets 150 - 400 K/uL 163  154  157       Latest Ref Rng & Units 02/15/2023   10:06 AM 01/18/2023   10:40 AM 12/24/2022   10:40 AM  CMP  Glucose 70 - 99 mg/dL 132  123  99   BUN 8 - 23 mg/dL 30  32  27   Creatinine 0.44 - 1.00 mg/dL 2.49  2.50  2.45   Sodium 135 - 145 mmol/L 141  139  137   Potassium 3.5 - 5.1 mmol/L 3.8  3.6  3.9   Chloride 98 - 111 mmol/L 109  106  105   CO2 22 - 32 mmol/L 22  21  21    Calcium 8.9 - 10.3 mg/dL 9.5  9.3  9.6   Total Protein 6.5 - 8.1 g/dL 6.6  6.8  6.7   Total Bilirubin 0.3 - 1.2 mg/dL 0.6  0.7  0.8   Alkaline Phos 38 - 126 U/L 80  73  73   AST 15 - 41 U/L 19  19  18    ALT 0 - 44 U/L 15  13  12       Lab Results  Component Value Date   CEA1 12.4 (H) 01/18/2023   CEA 39.5 (H) 08/09/2019   /  CEA  Date Value Ref Range Status  01/18/2023 12.4 (H) 0.0 - 4.7 ng/mL Final    Comment:    (NOTE)                             Nonsmokers          <3.9                             Smokers             <5.6 Roche [employer]ostics Electrochemiluminescence Immunoassay (ECLIA) Values obtained with different assay methods or kits cannot be used interchangeably.  Results cannot be interpreted as absolute evidence of the presence or absence of malignant disease. Performed At: [facility] [place], [place] [id number] Rush Farmer  MD [id number]   08/09/2019 39.5 (H) ng/mL Final    Comment:    Non-Smoker: <2.5 Smoker:     <5.0 . . This test was performed using the Siemens  chemiluminescent method. Values obtained from different assay methods cannot be used interchangeably. CEA levels, regardless of value, should not be interpreted as absolute evidence of the presence or absence of disease. .    No results found for: "PSA1" No results found for: "[id number]" No results found for: "CAN125"  No results found for: "TOTALPROTELP", "ALBUMINELP", "A1GS", "A2GS", "BETS", "BETA2SER", "GAMS", "MSPIKE", "SPEI" Lab Results  Component Value Date   TIBC 357 06/04/2022   TIBC 269 11/22/2019   TIBC 271 10/23/2019   FERRITIN 10 (L) 06/04/2022   FERRITIN 230 11/22/2019   FERRITIN 47 10/23/2019   IRONPCTSAT 9 (L) 06/04/2022   IRONPCTSAT 31 11/22/2019   IRONPCTSAT 11 10/23/2019   No results found for: "LDH"   STUDIES:   CT CHEST ABDOMEN PELVIS WO CONTRAST  Result Date: 02/10/2023 CLINICAL DATA:  82 year old female with history of multiple prior neoplasms presents for follow-up. * Tracking Code: BO * EXAM: CT CHEST, ABDOMEN AND PELVIS WITHOUT CONTRAST TECHNIQUE: Multidetector CT imaging of the chest, abdomen and pelvis was performed following the standard protocol without IV contrast. RADIATION DOSE REDUCTION: This exam was performed according to the departmental dose-optimization program which includes automated exposure control, adjustment of the mA and/or kV according to patient size and/or use of iterative reconstruction technique. COMPARISON:  PET exam of December of 2023 and  September of 2023 and CT of May 28, 2022 FINDINGS: CT CHEST FINDINGS Cardiovascular: Calcified aortic atherosclerotic changes without aneurysm. Abundant three-vessel coronary artery disease post median sternotomy for coronary revascularization. Normal caliber of central pulmonary vessels. LEFT-sided Port-A-Cath terminates in the mid SVC.  Mediastinum/Nodes: No thoracic inlet, axillary, mediastinal or hilar adenopathy. Esophagus grossly normal. Lungs/Pleura: No lobar consolidation. No sign of pleural effusion. Basilar atelectasis. LEFT lower lobe pulmonary nodule (image 59/3) 6 mm, previously approximately 5-6 mm when measured by this observer on the exam from May 28, 2022 in a similar fashion; however, this measured approximately 4 mm in February 2023. No new pulmonary nodules. Airways are patent. Musculoskeletal: Nodularity in the LEFT breast associated with prior lumpectomy similar to prior imaging. See below for full musculoskeletal details. CT ABDOMEN PELVIS FINDINGS Hepatobiliary: Stable hypodense hepatic lesion approximately 13 mm and water density no additional visible lesions on noncontrast imaging. Cholelithiasis without pericholecystic stranding. No gross biliary duct distension. Pancreas: Moderate atrophy of the pancreas without signs of inflammation or contour abnormality. Spleen: Normal. Adrenals/Urinary Tract: Stranding about the area of the LEFT adrenal gland is similar to prior imaging following LEFT nephrectomy likely postoperative changes following suture lines in the LEFT retroperitoneum. RIGHT adrenal gland is normal. RIGHT kidney without contour abnormality or hydronephrosis. No perinephric stranding. No perivesical stranding. Stomach/Bowel: Small hiatal hernia. No signs of bowel obstruction. Ileo colonic anastomosis in the RIGHT upper quadrant. Colonic diverticulosis and diverticular changes most pronounced in the sigmoid colon. Vascular/Lymphatic: Post bilateral iliac artery stenting. Infrarenal abdominal aortic aneurysm 3.1 x 3.3 cm, within 1 mm of previous measurement. Smooth contour of the IVC. No adenopathy in the retroperitoneum or upper abdomen. No adenopathy in the pelvis. Reproductive: Unremarkable by CT. Other: No ascites. Musculoskeletal: No acute bone finding. No destructive bone process. Spinal degenerative changes.  Osteopenia. No body wall mass. IMPRESSION: 1. LEFT lower lobe pulmonary nodule 6 mm, previously approximately 5-6 mm when measured by this observer on the exam from May 28, 2022 in a similar fashion; however, this measured approximately 4 mm in February 2023. Small changes in size are of uncertain significance, attention on follow-up. 2. No new pulmonary nodules. 3. Cholelithiasis. 4. Colonic diverticulosis and diverticular changes most pronounced in the sigmoid colon. 5. Post LEFT nephrectomy. 6. Post bilateral iliac artery stenting. Infrarenal abdominal aortic aneurysm 3.1 x 3.3 cm, within 1 mm of previous measurement. Recommend follow-up every 3 years. 7. Three-vessel coronary artery disease. 8. Aortic atherosclerosis. Aortic Atherosclerosis (ICD10-I70.0). Electronically Signed   By: Zetta Bills M.D.   On: 02/10/2023 08:47

## 2023-02-15 NOTE — Progress Notes (Signed)
Patient is taking Anastrozole as prescribed.  She has not missed any doses and reports joint and muscle pain as side effects at this time.

## 2023-02-16 ENCOUNTER — Other Ambulatory Visit: Payer: Self-pay | Admitting: Hematology

## 2023-02-17 LAB — T4: T4, Total: 7.8 ug/dL (ref 4.5–12.0)

## 2023-02-22 DIAGNOSIS — I739 Peripheral vascular disease, unspecified: Secondary | ICD-10-CM | POA: Diagnosis not present

## 2023-02-22 DIAGNOSIS — Z23 Encounter for immunization: Secondary | ICD-10-CM | POA: Diagnosis not present

## 2023-02-22 DIAGNOSIS — E039 Hypothyroidism, unspecified: Secondary | ICD-10-CM | POA: Diagnosis not present

## 2023-02-22 DIAGNOSIS — M170 Bilateral primary osteoarthritis of knee: Secondary | ICD-10-CM | POA: Diagnosis not present

## 2023-02-22 DIAGNOSIS — I2510 Atherosclerotic heart disease of native coronary artery without angina pectoris: Secondary | ICD-10-CM | POA: Diagnosis not present

## 2023-02-22 DIAGNOSIS — N1832 Chronic kidney disease, stage 3b: Secondary | ICD-10-CM | POA: Diagnosis not present

## 2023-02-22 DIAGNOSIS — I25111 Atherosclerotic heart disease of native coronary artery with angina pectoris with documented spasm: Secondary | ICD-10-CM | POA: Diagnosis not present

## 2023-02-22 DIAGNOSIS — Z0000 Encounter for general adult medical examination without abnormal findings: Secondary | ICD-10-CM | POA: Diagnosis not present

## 2023-02-22 DIAGNOSIS — E782 Mixed hyperlipidemia: Secondary | ICD-10-CM | POA: Diagnosis not present

## 2023-02-22 DIAGNOSIS — E559 Vitamin D deficiency, unspecified: Secondary | ICD-10-CM | POA: Diagnosis not present

## 2023-02-22 DIAGNOSIS — I10 Essential (primary) hypertension: Secondary | ICD-10-CM | POA: Diagnosis not present

## 2023-02-25 ENCOUNTER — Other Ambulatory Visit: Payer: Self-pay

## 2023-03-01 NOTE — Progress Notes (Signed)
[facility] [address], [place] [address]    Clinic Day:  03/02/2023  Referring physician: Tally Joe, MD  Patient Care Team: Tally Joe, MD as PCP - General (Family Medicine) Jake Bathe, MD as PCP - Cardiology (Cardiology) Karie Soda, MD as Consulting Physician (General Surgery) Jake Bathe, MD as Consulting Physician (Cardiology) Mansouraty, Netty Starring., MD as Consulting Physician (Gastroenterology) Mickie Bail, RN as Oncology Nurse Navigator Doreatha Massed, MD as Consulting Physician (Medical Oncology)   ASSESSMENT & PLAN:   Assessment: 1.  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  Right hemicolectomy on 10/11/2019, grade 3 adenocarcinoma, negative margins, positive LVSI, 2 tumor deposits, 0/15 lymph nodes positive, PT3PN1C. 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  MLH1 hyper methylation present. -PET scan in December 2020 showed lymph node in the right axillary region.  Biopsy consistent with metastatic colon cancer. -Last CEA was 10.7 on 11/22/2019. -Right axillary lymph node excision on 04/02/2020 consistent with metastatic carcinoma from colon cancer. -Pembrolizumab started on 05/14/2020. -PET scan on 08/05/2020 shows complete resolution of lymphadenopathy.  No evidence of new areas of uptake.  Mild vague residual hypermetabolism in the right axilla without soft tissue mass. -PET scan on 12/23/2020 with no evidence of recurrence. Rande Lawman held since we started opdivo on 08/27/2022   2.  Stage I (PT1CPN0) right Breast, Grade 2 Invasive Lobular Carcinoma: -MRI of the breast on 12/25/2019 showed 1 cm mass behind the right areola with a suspicious mass in the left breast. -Right breast lumpectomy on 04/02/2020 shows invasive lobular carcinoma, grade 2, 1.2 cm.  Resection margins are negative.  Negative LVSI.  2 sentinel lymph nodes  were negative for carcinoma.  ER/PR 100% positive, HER-2 negative, Ki-67 10%.   3.  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 - Based on her renal function, she is not a candidate for cisplatin based chemotherapy.  Nivolumab was recommended for 1 year. - PET scan (08/06/2022): No evidence of metastatic disease. - Opdivo for 1 year started on 08/27/2022   Plan: 1.  Stage IV colon cancer to the right axillary lymph node: - PET scan on 11/11/2022: No evidence of recurrence.   2.  Right breast invasive lobular carcinoma, grade 2: - Last mammogram in May 2023, BI-RADS Category 1. - Continue anastrozole daily.   3.  Blistering rash: - She had left inframammary area of blistering rash at last visit.  She applied steroid cream twice daily. - Today no rash was seen.   4.  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 - We have also made referral to Dr. Wolfgang Phoenix locally per patient request. - Reviewed labs today: Normal LFTs.  Creatinine stable at 2.46.  CBC normal. - Proceed with nivolumab today.  RTC 4 weeks for follow-up.   5.  Hypothyroidism: - TSH today is 1.69.  Continue Synthroid 50 mcg daily.  Orders Placed This Encounter  Procedures   Magnesium    Standing Status:  Future    Standing Expiration Date:   03/29/2024   Magnesium    Standing Status:   Future    Standing Expiration Date:   04/26/2024   CBC with Differential    Standing Status:   Future     Standing Expiration Date:   04/27/2024   Comprehensive metabolic panel    Standing Status:   Future    Standing Expiration Date:   04/27/2024   T4    Standing Status:   Future    Standing Expiration Date:   04/27/2024   TSH    Standing Status:   Future    Standing Expiration Date:   04/27/2024   Magnesium    Standing Status:   Future    Standing Expiration Date:   05/24/2024   CBC with Differential    Standing Status:   Future    Standing Expiration Date:   05/25/2024   Comprehensive metabolic panel    Standing Status:   Future    Standing Expiration Date:   05/25/2024   T4    Standing Status:   Future    Standing Expiration Date:   05/25/2024   TSH    Standing Status:   Future    Standing Expiration Date:   05/25/2024   Magnesium    Standing Status:   Future    Standing Expiration Date:   06/21/2024   CBC with Differential    Standing Status:   Future    Standing Expiration Date:   06/22/2024   Comprehensive metabolic panel    Standing Status:   Future    Standing Expiration Date:   06/22/2024   T4    Standing Status:   Future    Standing Expiration Date:   06/22/2024   TSH    Standing Status:   Future    Standing Expiration Date:   06/22/2024   Magnesium    Standing Status:   Future    Standing Expiration Date:   07/19/2024   CBC with Differential    Standing Status:   Future    Standing Expiration Date:   07/20/2024   Comprehensive metabolic panel    Standing Status:   Future    Standing Expiration Date:   07/20/2024   T4    Standing Status:   Future    Standing Expiration Date:   07/20/2024   TSH    Standing Status:   Future    Standing Expiration Date:   07/20/2024      I,Katie Daubenspeck,acting as a scribe for Doreatha Massed, MD.,have documented all relevant documentation on the behalf of Doreatha Massed, MD,as directed by  Doreatha Massed, MD while in the presence of Doreatha Massed, MD.   I, Doreatha Massed MD, have reviewed the above documentation for  accuracy and completeness, and I agree with the above.   Doreatha Massed, MD   4/9/20245:34 PM  CHIEF COMPLAINT:   Diagnosis: stage I right breast cancer and colon cancer and left kidney urothelial carcinoma    Cancer Staging  Cancer of ascending colon s/p robotic proximal colectomy 10/11/2019 Staging form: Colon and Rectum, AJCC 8th Edition - Clinical stage from 10/23/2019: Stage IVA (cT3, cN1c, cM1a) - Signed by Doreatha Massed, MD on 12/14/2019  Malignant neoplasm of right female breast Staging form: Breast, AJCC 8th Edition - Clinical stage from 04/23/2020: Stage IA (cT1c, cN0(sn), cM0, G2, ER+, PR+, HER2-) - Signed by Doreatha Massed, MD on 04/23/2020  Urothelial carcinoma of kidney, left Staging form:  Kidney, AJCC 8th Edition - Clinical stage from 08/18/2022: Stage III (cT3, cN0, cM0) - Unsigned    Prior Therapy: 1. Robotic proximal colectomy on 10/11/2019. 2. Right breast lumpectomy and SLNB on 04/02/2020 3.  Left radical nephro ureterectomy, lymphadenectomy, bladder cuff excision on 07/13/2022  Current Therapy:  Opdivo every 4 weeks    HISTORY OF PRESENT ILLNESS:   Oncology History  Cancer of ascending colon s/p robotic proximal colectomy 10/11/2019  10/11/2019 Initial Diagnosis   Cancer of ascending colon s/p robotic proximal colectomy 10/11/2019   10/23/2019 Cancer Staging   Staging form: Colon and Rectum, AJCC 8th Edition - Clinical stage from 10/23/2019: Stage IVA (cT3, cN1c, cM1a) - Signed by Doreatha MassedKatragadda, Laramie Meissner, MD on 12/14/2019   05/14/2020 - 06/26/2022 Chemotherapy   Patient is on Treatment Plan : COLORECTAL Pembrolizumab q21d     05/14/2020 - 06/26/2022 Chemotherapy   Patient is on Treatment Plan : COLORECTAL Pembrolizumab (200) q21d     08/27/2022 -  Chemotherapy   Patient is on Treatment Plan : COLORECTAL Nivolumab (480) q28d     Malignant neoplasm of right female breast  04/23/2020 Initial Diagnosis   Infiltrating lobular carcinoma of right  breast in female [facility](HCC)   04/23/2020 Cancer Staging   Staging form: Breast, AJCC 8th Edition - Clinical stage from 04/23/2020: Stage IA (cT1c, cN0(sn), cM0, G2, ER+, PR+, HER2-) - Signed by Doreatha MassedKatragadda, Caelum Federici, MD on 04/23/2020   Urothelial carcinoma of kidney, left  08/18/2022 Initial Diagnosis   Urothelial carcinoma of kidney, left (HCC)   08/27/2022 -  Chemotherapy   Patient is on Treatment Plan : COLORECTAL Nivolumab (480) q28d        INTERVAL HISTORY:   Inocencio HomesGayle is a 82 y.o. female presenting to clinic today for follow up of stage I right breast cancer and colon cancer and left kidney urothelial carcinoma. She was last seen by me on 02/15/23.  Today, she states that she is doing well overall. Her appetite level is at 100%. Her energy level is at 100%.  PAST MEDICAL HISTORY:   Past Medical History: Past Medical History:  Diagnosis Date   Abdominal aortic aneurysm    Acid reflux    Anemia    Arthritis    knees , R shoulder - tx /w injection - 11/2014   Basal cell carcinoma (BCC) of dorsum of nose 2010   Resolved   Cancer    basal cell on nose   Colon cancer    Coronary artery disease    Hypercholesteremia    Hypertension    Hypothyroidism    Lt Acute pyelonephritis 09/11/2018   MI, old 2000   Peripheral arterial disease    hhigh-grade ostial bilateral calcified iliac stenosis with claudication   Tobacco abuse    Vertigo    when lays on left side.    Surgical History: Past Surgical History:  Procedure Laterality Date   BREAST LUMPECTOMY WITH RADIOACTIVE SEED AND SENTINEL LYMPH NODE BIOPSY Bilateral 04/02/2020   Procedure: BILATERAL BREAST LUMPECTOMY WITH RADIOACTIVE SEED AND RIGHT SENTINEL LYMPH NODE BIOPSY AND RIGHT TARGETED AXILLARY LYMPH NODE BIOPSY;  Surgeon: Harriette Bouillonornett, Thomas, MD;  Location: [facility];  Service: General;  Laterality: Bilateral;   CARDIAC CATHETERIZATION  5 stents   CARDIAC CATHETERIZATION N/A 01/20/2016   Procedure: Left Heart Cath and  Coronary Angiography;  Surgeon: Peter M SwazilandJordan, MD;  [facility];  Service: Cardiovascular;  Laterality: N/A;   CATARACT EXTRACTION W/PHACO Left 05/24/2014   Procedure: CATARACT  EXTRACTION PHACO AND INTRAOCULAR LENS PLACEMENT (IOC);  Surgeon: Gemma Payor, MD;  Location: [facility];  Service: Ophthalmology;  Laterality: Left;  CDE:  9.30   CATARACT EXTRACTION W/PHACO Right 06/18/2014   Procedure: CATARACT EXTRACTION PHACO AND INTRAOCULAR LENS PLACEMENT RIGHT EYE CDE=10.84;  Surgeon: Gemma Payor, MD;  Location: [facility];  Service: Ophthalmology;  Laterality: Right;   CORONARY ARTERY BYPASS GRAFT N/A 01/24/2016   Procedure: CORONARY ARTERY BYPASS GRAFTING (CABG);  Surgeon: Alleen Borne, MD;  Location[facility] OR;  Service: Open Heart Surgery;  Laterality: N/A;   CORONARY STENT PLACEMENT     CORONARY STENT PLACEMENT  03/06/15   CFX DES   CYSTOSCOPY/URETEROSCOPY/HOLMIUM LASER/STENT PLACEMENT Left 09/12/2018   Procedure: CYSTOSCOPY/URETEROSCOPY/STENT PLACEMENT;  Surgeon: Rene Paci, MD;  Location: [facility];  Service: Urology;  Laterality: Left;   EYE SURGERY     LEFT HEART CATH AND CORS/GRAFTS ANGIOGRAPHY N/A 09/12/2019   Procedure: LEFT HEART CATH AND CORS/GRAFTS ANGIOGRAPHY;  Surgeon: Lennette Bihari, MD;  Location: [facility] INVASIVE CV LAB;  Service: Cardiovascular;  Laterality: N/A;   LEFT HEART CATHETERIZATION WITH CORONARY ANGIOGRAM N/A 03/06/2015   Procedure: LEFT HEART CATHETERIZATION WITH CORONARY ANGIOGRAM;  Surgeon: Runell Gess, MD;  Location[facility] LAB;  Service: Cardiovascular;  Laterality: N/A;   PARTIAL KNEE ARTHROPLASTY Right 02/04/2015   Procedure: UNICOMPARTMENTAL KNEE;  Surgeon: Jodi Geralds, MD;  Location: [facility] OR;  Service: Orthopedics;  Laterality: Right;   PERIPHERAL VASCULAR CATHETERIZATION Bilateral 05/13/2015   Procedure: Lower Extremity Angiography;  Surgeon: Runell Gess, MD;  Location: [facility] INVASIVE CV LAB;  Service: Cardiovascular;  Laterality: Bilateral;    PERIPHERAL VASCULAR CATHETERIZATION N/A 05/13/2015   Procedure: Abdominal Aortogram;  Surgeon: Runell Gess, MD;  Location: [facility] INVASIVE CV LAB;  Service: Cardiovascular;  Laterality: N/A;   PERIPHERAL VASCULAR CATHETERIZATION Bilateral 06/27/2015   Procedure: Peripheral Vascular Intervention;  Surgeon: Runell Gess, MD;  Location: [facility] INVASIVE CV LAB;  Service: Cardiovascular;  Laterality: Bilateral;  ILIACS   PERIPHERAL VASCULAR CATHETERIZATION Bilateral 06/27/2015   Procedure: Peripheral Vascular Atherectomy;  Surgeon: Runell Gess, MD;  Location: [facility] CV LAB;  Service: Cardiovascular;  Laterality: Bilateral;   PORTACATH PLACEMENT Left 05/24/2020   Procedure: INSERTION PORT-A-CATH;  Surgeon: Franky Macho, MD;  Location: [facility];  Service: General;  Laterality: Left;   TEE WITHOUT CARDIOVERSION N/A 01/24/2016   Procedure: TRANSESOPHAGEAL ECHOCARDIOGRAM (TEE);  Surgeon: Alleen Borne, MD;  Location: [facility] OR;  Service: Open Heart Surgery;  Laterality: N/A;   TONSILLECTOMY     age 25   TUBAL LIGATION      Social History: Social History   Socioeconomic History   Marital status: Married    Spouse name: Tommy   Number of children: 4   Years of education: Not on file   Highest education level: Not on file  Occupational History   Occupation: retired    Comment: worked as Tree surgeon for [employer]  Tobacco Use   Smoking status: Former    Packs/day: 1.50    Years: 42.00    Additional pack years: 0.00    Total pack years: 63.00    Types: Cigarettes    Quit date: 05/19/1999    Years since quitting: 23.8   Smokeless tobacco: Never  Vaping Use   Vaping Use: Never used  Substance and Sexual Activity   Alcohol use: No   Drug use: No   Sexual activity: Yes    Birth control/protection: Surgical  Other Topics  Concern   Not on file  Social History Narrative   Not on file   Social Determinants of Health   Financial Resource Strain: Medium Risk (02/18/2021)   Overall  Financial Resource Strain (CARDIA)    Difficulty of Paying Living Expenses: Somewhat hard  Food Insecurity: No Food Insecurity (02/18/2021)   Hunger Vital Sign    Worried About Running Out of Food in the Last Year: Never true    Ran Out of Food in the Last Year: Never true  Transportation Needs: No Transportation Needs (02/18/2021)   PRAPARE - Administrator, Civil Service (Medical): No    Lack of Transportation (Non-Medical): No  Physical Activity: Inactive (02/18/2021)   Exercise Vital Sign    Days of Exercise per Week: 0 days    Minutes of Exercise per Session: 0 min  Stress: No Stress Concern Present (02/18/2021)   [employer] of Occupational Health - Occupational Stress Questionnaire    Feeling of Stress : Not at all  Social Connections: Moderately Isolated (02/18/2021)   Social Connection and Isolation Panel [NHANES]    Frequency of Communication with Friends and Family: More than three times a week    Frequency of Social Gatherings with Friends and Family: Three times a week    Attends Religious Services: Never    Active Member of Clubs or Organizations: No    Attends Banker Meetings: Never    Marital Status: Married  Catering manager Violence: Not At Risk (02/18/2021)   Humiliation, Afraid, Rape, and Kick questionnaire    Fear of Current or Ex-Partner: No    Emotionally Abused: No    Physically Abused: No    Sexually Abused: No    Family History: Family History  Problem Relation Age of Onset   Ovarian cancer Mother 7   Cancer Father 33       unsure of which kind, "it was in his glands"   Hypertension Maternal Grandmother    Stroke Maternal Grandfather    Hypertension Son    Brain cancer Sister    Hypertension Daughter    Heart attack Neg Hx    Colon cancer Neg Hx    Esophageal cancer Neg Hx    Inflammatory bowel disease Neg Hx    Liver disease Neg Hx    Pancreatic cancer Neg Hx    Rectal cancer Neg Hx    Stomach cancer Neg Hx      Current Medications:  Current Outpatient Medications:    amLODipine (NORVASC) 5 MG tablet, TAKE 1 TABLET BY MOUTH ONCE A DAY., Disp: 30 tablet, Rfl: 0   anastrozole (ARIMIDEX) 1 MG tablet, TAKE 1 TABLET BY MOUTH ONCE DAILY., Disp: 30 tablet, Rfl: 0   aspirin EC 81 MG tablet, Take 81 mg by mouth every evening. , Disp: , Rfl:    atorvastatin (LIPITOR) 40 MG tablet, TAKE (1) TABLET BY MOUTH ONCE DAILY. KEEP FUTURE APPOINTMENTS FOR ANY FURTHER REFILLS., Disp: 90 tablet, Rfl: 3   betamethasone dipropionate 0.05 % cream, Apply topically 2 (two) times daily., Disp: 30 g, Rfl: 0   clopidogrel (PLAVIX) 75 MG tablet, Take 1 tablet (75 mg total) by mouth daily. FILLS., Disp: 90 tablet, Rfl: 3   esomeprazole (NEXIUM) 20 MG capsule, Take 20 mg by mouth daily. , Disp: , Rfl:    HYDROcodone-acetaminophen (NORCO/VICODIN) 5-325 MG tablet, Take 1 tablet by mouth daily., Disp: 15 tablet, Rfl: 0   hydrOXYzine (ATARAX) 25 MG tablet, TAKE (1) TABLET BY MOUTH  AT BEDTIME AS NEEDED, Disp: 30 tablet, Rfl: 0   levothyroxine (SYNTHROID) 50 MCG tablet, Take 0.5 tablets (25 mcg total) by mouth daily., Disp: 30 tablet, Rfl: 5   metoprolol tartrate (LOPRESSOR) 25 MG tablet, TAKE 1 TABLET BY MOUTH TWICE DAILY., Disp: 160 tablet, Rfl: 3   nitroGLYCERIN (NITROSTAT) 0.4 MG SL tablet, DISSOLVE 1 TABLET SUBLINGUALLY AS NEEDED FOR CHEST PAIN, MAY REPEAT EVERY 5 MINUTES. AFTER 3 CALL 911., Disp: 25 tablet, Rfl: 4   nivolumab (OPDIVO) 100 MG/10ML SOLN chemo injection, as directed Intravenous once a month, Disp: , Rfl:    Vitamin D, Ergocalciferol, (DRISDOL) 1.25 MG (50000 UNIT) CAPS capsule, TAKE 1 CAPSULE BY MOUTH ONCE A WEEK., Disp: 4 capsule, Rfl: 0 No current facility-administered medications for this visit.  Facility-Administered Medications Ordered in Other Visits:    sodium chloride flush (NS) 0.9 % injection 10 mL, 10 mL, Intracatheter, PRN, Doreatha Massed, MD, 10 mL at 11/26/22 1631   sodium chloride flush (NS) 0.9  % injection 10 mL, 10 mL, Intracatheter, PRN, Doreatha Massed, MD, 10 mL at 03/02/23 1120   Allergies: Allergies  Allergen Reactions   Crab [Shellfish Allergy] Nausea And Vomiting    Throws up violently   Ezetimibe Diarrhea   Keflex [Cephalexin]     Caused sores   Other Nausea And Vomiting    Shrimp   Shrimp Extract     Other Reaction(s): GI Intolerance    REVIEW OF SYSTEMS:   Review of Systems  Constitutional:  Negative for chills, fatigue and fever.  HENT:   Negative for lump/mass, mouth sores, nosebleeds, sore throat and trouble swallowing.   Eyes:  Negative for eye problems.  Respiratory:  Negative for cough and shortness of breath.   Cardiovascular:  Negative for chest pain, leg swelling and palpitations.  Gastrointestinal:  Negative for abdominal pain, constipation, diarrhea, nausea and vomiting.  Genitourinary:  Negative for bladder incontinence, difficulty urinating, dysuria, frequency, hematuria and nocturia.   Musculoskeletal:  Negative for arthralgias, back pain, flank pain, myalgias and neck pain.  Skin:  Negative for itching and rash.  Neurological:  Negative for dizziness, headaches and numbness.  Hematological:  Does not bruise/bleed easily.  Psychiatric/Behavioral:  Negative for depression, sleep disturbance and suicidal ideas. The patient is not nervous/anxious.   All other systems reviewed and are negative.    VITALS:   There were no vitals taken for this visit.  Wt Readings from Last 3 Encounters:  03/02/23 156 lb 3.2 oz (70.9 kg)  02/15/23 154 lb 12.8 oz (70.2 kg)  01/18/23 154 lb 3.2 oz (69.9 kg)    There is no height or weight on file to calculate BMI.  Performance status (ECOG): 1 - Symptomatic but completely ambulatory  PHYSICAL EXAM:   Physical Exam Vitals and nursing note reviewed. Exam conducted with a chaperone present.  Constitutional:      Appearance: Normal appearance.  Cardiovascular:     Rate and Rhythm: Normal rate and  regular rhythm.     Pulses: Normal pulses.     Heart sounds: Normal heart sounds.  Pulmonary:     Effort: Pulmonary effort is normal.     Breath sounds: Normal breath sounds.  Abdominal:     Palpations: Abdomen is soft. There is no hepatomegaly, splenomegaly or mass.     Tenderness: There is no abdominal tenderness.  Musculoskeletal:     Right lower leg: No edema.     Left lower leg: No edema.  Lymphadenopathy:  Cervical: No cervical adenopathy.     Right cervical: No superficial, deep or posterior cervical adenopathy.    Left cervical: No superficial, deep or posterior cervical adenopathy.     Upper Body:     Right upper body: No supraclavicular or axillary adenopathy.     Left upper body: No supraclavicular or axillary adenopathy.  Neurological:     General: No focal deficit present.     Mental Status: She is alert and oriented to person, place, and time.  Psychiatric:        Mood and Affect: Mood normal.        Behavior: Behavior normal.     LABS:      Latest Ref Rng & Units 03/02/2023    9:02 AM 02/15/2023   10:06 AM 01/18/2023   10:40 AM  CBC  WBC 4.0 - 10.5 K/uL 5.0  5.2  4.3   Hemoglobin 12.0 - 15.0 g/dL 16.1  09.6  04.5   Hematocrit 36.0 - 46.0 % 39.5  41.0  40.9   Platelets 150 - 400 K/uL 159  163  154       Latest Ref Rng & Units 03/02/2023    9:02 AM 02/15/2023   10:06 AM 01/18/2023   10:40 AM  CMP  Glucose 70 - 99 mg/dL 409  811  914   BUN 8 - 23 mg/dL 28  30  32   Creatinine 0.44 - 1.00 mg/dL 7.82  9.56  2.13   Sodium 135 - 145 mmol/L 137  141  139   Potassium 3.5 - 5.1 mmol/L 4.1  3.8  3.6   Chloride 98 - 111 mmol/L 108  109  106   CO2 22 - 32 mmol/L 21  22  21    Calcium 8.9 - 10.3 mg/dL 9.4  9.5  9.3   Total Protein 6.5 - 8.1 g/dL 6.7  6.6  6.8   Total Bilirubin 0.3 - 1.2 mg/dL 0.5  0.6  0.7   Alkaline Phos 38 - 126 U/L 89  80  73   AST 15 - 41 U/L 16  19  19    ALT 0 - 44 U/L 14  15  13       Lab Results  Component Value Date   CEA1 12.4 (H)  01/18/2023   CEA 39.5 (H) 08/09/2019   /  CEA  Date Value Ref Range Status  01/18/2023 12.4 (H) 0.0 - 4.7 ng/mL Final    Comment:    (NOTE)                             Nonsmokers          <3.9                             Smokers             <5.6 Roche [employer]ostics Electrochemiluminescence Immunoassay (ECLIA) Values obtained with different assay methods or kits cannot be used interchangeably.  Results cannot be interpreted as absolute evidence of the presence or absence of malignant disease. Performed At: [facility] [address] [place], [place] [id number] Jolene Schimke MD [id number]   08/09/2019 39.5 (H) ng/mL Final    Comment:    Non-Smoker: <2.5 Smoker:     <5.0 . . This test was performed using the Siemens  chemiluminescent method. Values obtained from different assay methods cannot be  used interchangeably. CEA levels, regardless of value, should not be interpreted as absolute evidence of the presence or absence of disease. .    No results found for: "PSA1" No results found for: "ZOX096" No results found for: "CAN125"  No results found for: "TOTALPROTELP", "ALBUMINELP", "A1GS", "A2GS", "BETS", "BETA2SER", "GAMS", "MSPIKE", "SPEI" Lab Results  Component Value Date   TIBC 357 06/04/2022   TIBC 269 11/22/2019   TIBC 271 10/23/2019   FERRITIN 10 (L) 06/04/2022   FERRITIN 230 11/22/2019   FERRITIN 47 10/23/2019   IRONPCTSAT 9 (L) 06/04/2022   IRONPCTSAT 31 11/22/2019   IRONPCTSAT 11 10/23/2019   No results found for: "LDH"   STUDIES:   CT CHEST ABDOMEN PELVIS WO CONTRAST  Result Date: 02/10/2023 CLINICAL DATA:  82 year old female with history of multiple prior neoplasms presents for follow-up. * Tracking Code: BO * EXAM: CT CHEST, ABDOMEN AND PELVIS WITHOUT CONTRAST TECHNIQUE: Multidetector CT imaging of the chest, abdomen and pelvis was performed following the standard protocol without IV contrast. RADIATION DOSE REDUCTION: This exam was  performed according to the departmental dose-optimization program which includes automated exposure control, adjustment of the mA and/or kV according to patient size and/or use of iterative reconstruction technique. COMPARISON:  PET exam of December of 2023 and September of 2023 and CT of May 28, 2022 FINDINGS: CT CHEST FINDINGS Cardiovascular: Calcified aortic atherosclerotic changes without aneurysm. Abundant three-vessel coronary artery disease post median sternotomy for coronary revascularization. Normal caliber of central pulmonary vessels. LEFT-sided Port-A-Cath terminates in the mid SVC. Mediastinum/Nodes: No thoracic inlet, axillary, mediastinal or hilar adenopathy. Esophagus grossly normal. Lungs/Pleura: No lobar consolidation. No sign of pleural effusion. Basilar atelectasis. LEFT lower lobe pulmonary nodule (image 59/3) 6 mm, previously approximately 5-6 mm when measured by this observer on the exam from May 28, 2022 in a similar fashion; however, this measured approximately 4 mm in February 2023. No new pulmonary nodules. Airways are patent. Musculoskeletal: Nodularity in the LEFT breast associated with prior lumpectomy similar to prior imaging. See below for full musculoskeletal details. CT ABDOMEN PELVIS FINDINGS Hepatobiliary: Stable hypodense hepatic lesion approximately 13 mm and water density no additional visible lesions on noncontrast imaging. Cholelithiasis without pericholecystic stranding. No gross biliary duct distension. Pancreas: Moderate atrophy of the pancreas without signs of inflammation or contour abnormality. Spleen: Normal. Adrenals/Urinary Tract: Stranding about the area of the LEFT adrenal gland is similar to prior imaging following LEFT nephrectomy likely postoperative changes following suture lines in the LEFT retroperitoneum. RIGHT adrenal gland is normal. RIGHT kidney without contour abnormality or hydronephrosis. No perinephric stranding. No perivesical stranding.  Stomach/Bowel: Small hiatal hernia. No signs of bowel obstruction. Ileo colonic anastomosis in the RIGHT upper quadrant. Colonic diverticulosis and diverticular changes most pronounced in the sigmoid colon. Vascular/Lymphatic: Post bilateral iliac artery stenting. Infrarenal abdominal aortic aneurysm 3.1 x 3.3 cm, within 1 mm of previous measurement. Smooth contour of the IVC. No adenopathy in the retroperitoneum or upper abdomen. No adenopathy in the pelvis. Reproductive: Unremarkable by CT. Other: No ascites. Musculoskeletal: No acute bone finding. No destructive bone process. Spinal degenerative changes. Osteopenia. No body wall mass. IMPRESSION: 1. LEFT lower lobe pulmonary nodule 6 mm, previously approximately 5-6 mm when measured by this observer on the exam from May 28, 2022 in a similar fashion; however, this measured approximately 4 mm in February 2023. Small changes in size are of uncertain significance, attention on follow-up. 2. No new pulmonary nodules. 3. Cholelithiasis. 4. Colonic diverticulosis and diverticular changes most pronounced in  the sigmoid colon. 5. Post LEFT nephrectomy. 6. Post bilateral iliac artery stenting. Infrarenal abdominal aortic aneurysm 3.1 x 3.3 cm, within 1 mm of previous measurement. Recommend follow-up every 3 years. 7. Three-vessel coronary artery disease. 8. Aortic atherosclerosis. Aortic Atherosclerosis (ICD10-I70.0). Electronically Signed   By: Donzetta Kohut M.D.   On: 02/10/2023 08:47

## 2023-03-02 ENCOUNTER — Inpatient Hospital Stay: Payer: Medicare Other | Attending: Hematology

## 2023-03-02 ENCOUNTER — Inpatient Hospital Stay (HOSPITAL_BASED_OUTPATIENT_CLINIC_OR_DEPARTMENT_OTHER): Payer: Medicare Other | Admitting: Hematology

## 2023-03-02 ENCOUNTER — Encounter: Payer: Self-pay | Admitting: Hematology

## 2023-03-02 ENCOUNTER — Inpatient Hospital Stay: Payer: Medicare Other

## 2023-03-02 VITALS — BP 154/59 | HR 55 | Temp 96.1°F | Resp 18 | Ht 63.0 in | Wt 156.2 lb

## 2023-03-02 DIAGNOSIS — Z5112 Encounter for antineoplastic immunotherapy: Secondary | ICD-10-CM | POA: Insufficient documentation

## 2023-03-02 DIAGNOSIS — E039 Hypothyroidism, unspecified: Secondary | ICD-10-CM | POA: Diagnosis not present

## 2023-03-02 DIAGNOSIS — Z79620 Long term (current) use of immunosuppressive biologic: Secondary | ICD-10-CM | POA: Diagnosis not present

## 2023-03-02 DIAGNOSIS — Z87891 Personal history of nicotine dependence: Secondary | ICD-10-CM | POA: Diagnosis not present

## 2023-03-02 DIAGNOSIS — C642 Malignant neoplasm of left kidney, except renal pelvis: Secondary | ICD-10-CM

## 2023-03-02 DIAGNOSIS — C652 Malignant neoplasm of left renal pelvis: Secondary | ICD-10-CM | POA: Insufficient documentation

## 2023-03-02 DIAGNOSIS — R21 Rash and other nonspecific skin eruption: Secondary | ICD-10-CM | POA: Diagnosis not present

## 2023-03-02 DIAGNOSIS — C773 Secondary and unspecified malignant neoplasm of axilla and upper limb lymph nodes: Secondary | ICD-10-CM | POA: Insufficient documentation

## 2023-03-02 DIAGNOSIS — Z853 Personal history of malignant neoplasm of breast: Secondary | ICD-10-CM | POA: Diagnosis not present

## 2023-03-02 DIAGNOSIS — C182 Malignant neoplasm of ascending colon: Secondary | ICD-10-CM | POA: Insufficient documentation

## 2023-03-02 DIAGNOSIS — Z95828 Presence of other vascular implants and grafts: Secondary | ICD-10-CM

## 2023-03-02 LAB — COMPREHENSIVE METABOLIC PANEL
ALT: 14 U/L (ref 0–44)
AST: 16 U/L (ref 15–41)
Albumin: 3.9 g/dL (ref 3.5–5.0)
Alkaline Phosphatase: 89 U/L (ref 38–126)
Anion gap: 8 (ref 5–15)
BUN: 28 mg/dL — ABNORMAL HIGH (ref 8–23)
CO2: 21 mmol/L — ABNORMAL LOW (ref 22–32)
Calcium: 9.4 mg/dL (ref 8.9–10.3)
Chloride: 108 mmol/L (ref 98–111)
Creatinine, Ser: 2.46 mg/dL — ABNORMAL HIGH (ref 0.44–1.00)
GFR, Estimated: 19 mL/min — ABNORMAL LOW (ref >60)
Glucose, Bld: 111 mg/dL — ABNORMAL HIGH (ref 70–99)
Potassium: 4.1 mmol/L (ref 3.5–5.1)
Sodium: 137 mmol/L (ref 135–145)
Total Bilirubin: 0.5 mg/dL (ref 0.3–1.2)
Total Protein: 6.7 g/dL (ref 6.5–8.1)

## 2023-03-02 LAB — CBC WITH DIFFERENTIAL/PLATELET
Abs Immature Granulocytes: 0.01 10*3/uL (ref 0.00–0.07)
Basophils Absolute: 0.1 10*3/uL (ref 0.0–0.1)
Basophils Relative: 1 %
Eosinophils Absolute: 0.2 10*3/uL (ref 0.0–0.5)
Eosinophils Relative: 3 %
HCT: 39.5 % (ref 36.0–46.0)
Hemoglobin: 12.7 g/dL (ref 12.0–15.0)
Immature Granulocytes: 0 %
Lymphocytes Relative: 23 %
Lymphs Abs: 1.1 10*3/uL (ref 0.7–4.0)
MCH: 29.3 pg (ref 26.0–34.0)
MCHC: 32.2 g/dL (ref 30.0–36.0)
MCV: 91 fL (ref 80.0–100.0)
Monocytes Absolute: 0.4 10*3/uL (ref 0.1–1.0)
Monocytes Relative: 8 %
Neutro Abs: 3.2 10*3/uL (ref 1.7–7.7)
Neutrophils Relative %: 65 %
Platelets: 159 10*3/uL (ref 150–400)
RBC: 4.34 MIL/uL (ref 3.87–5.11)
RDW: 13.1 % (ref 11.5–15.5)
WBC: 5 10*3/uL (ref 4.0–10.5)
nRBC: 0 % (ref 0.0–0.2)

## 2023-03-02 LAB — MAGNESIUM: Magnesium: 1.9 mg/dL (ref 1.7–2.4)

## 2023-03-02 LAB — TSH: TSH: 1.69 u[IU]/mL (ref 0.350–4.500)

## 2023-03-02 MED ORDER — SODIUM CHLORIDE 0.9% FLUSH
10.0000 mL | INTRAVENOUS | Status: AC | PRN
  Administered 2023-03-02: 10 mL

## 2023-03-02 MED ORDER — SODIUM CHLORIDE 0.9 % IV SOLN: Freq: Once | INTRAVENOUS | Status: AC

## 2023-03-02 MED ORDER — HEPARIN SOD (PORK) LOCK FLUSH 100 UNIT/ML IV SOLN
500.0000 [IU] | Freq: Once | INTRAVENOUS | Status: AC | PRN
  Administered 2023-03-02: 500 [IU]

## 2023-03-02 MED ORDER — SODIUM CHLORIDE 0.9% FLUSH
10.0000 mL | INTRAVENOUS | Status: DC | PRN
  Administered 2023-03-02: 10 mL via INTRAVENOUS

## 2023-03-02 MED ORDER — SODIUM CHLORIDE 0.9 % IV SOLN
480.0000 mg | Freq: Once | INTRAVENOUS | Status: AC
  Administered 2023-03-02: 480 mg via INTRAVENOUS
  Filled 2023-03-02: qty 48

## 2023-03-02 NOTE — Progress Notes (Signed)
Orders received to increase treatment parameters to 2.6 or > to notify MD.  V.O. Dr Legrand Pitts Charlestine Massed

## 2023-03-02 NOTE — Progress Notes (Signed)
Patient has been examined by Dr. Katragadda. Vital signs and labs have been reviewed by MD - ANC, Creatinine, LFTs, hemoglobin, and platelets are within treatment parameters per M.D. - pt may proceed with treatment.  Primary RN and pharmacy notified.  

## 2023-03-02 NOTE — Addendum Note (Signed)
Addended by: Dicky Doe D on: 03/02/2023 10:11 AM   Modules accepted: Orders

## 2023-03-02 NOTE — Progress Notes (Signed)
Treatment given today per MD orders. Tolerated infusion without adverse affects. Vital signs stable. No complaints at this time. Discharged from clinic ambulatory in stable condition. Alert and oriented x 3. F/U with [facility] as scheduled.   

## 2023-03-02 NOTE — Progress Notes (Signed)
Orders received to proceed with treatment. Increase treatment parameters for creatinine to 2.6 or >  and notify MD.

## 2023-03-02 NOTE — Patient Instructions (Signed)
[facility] Discharge Instructions   You were seen and examined today by Dr. Katragadda.  He reviewed the results of your lab work which are normal/stable.   We will proceed with your treatment today.  Return as scheduled.    Thank you for choosing [facility] at [facility] to provide your oncology and hematology care.  To afford each patient quality time with our provider, please arrive at least 15 minutes before your scheduled appointment time.   If you have a lab appointment with the Cancer Center please come in thru the Main Entrance and check in at the main information desk.  You need to re-schedule your appointment should you arrive 10 or more minutes late.  We strive to give you quality time with our providers, and arriving late affects you and other patients whose appointments are after yours.  Also, if you no show three or more times for appointments you may be dismissed from the clinic at the providers discretion.     Again, thank you for choosing [facility].  Our hope is that these requests will decrease the amount of time that you wait before being seen by our physicians.       _____________________________________________________________  Should you have questions after your visit to [facility], please contact our office at [phone] and follow the prompts.  Our office hours are 8:00 a.m. and 4:30 p.m. Monday - Friday.  Please note that voicemails left after 4:00 p.m. may not be returned until the following business day.  We are closed weekends and major holidays.  You do have access to a nurse 24-7, just call the main number to the clinic [phone] and do not press any options, hold on the line and a nurse will answer the phone.    For prescription refill requests, have your pharmacy contact our office and allow 72 hours.    Due to Covid, you will need to wear a mask upon entering  the hospital. If you do not have a mask, a mask will be given to you at the Main Entrance upon arrival. For doctor visits, patients may have 1 support person age 18 or older with them. For treatment visits, patients can not have anyone with them due to social distancing guidelines and our immunocompromised population.      

## 2023-03-02 NOTE — Patient Instructions (Signed)
[facility]  Discharge Instructions: Thank you for choosing [facility] to provide your oncology and hematology care.  If you have a lab appointment with the Cancer Center - please note that after April 8th, 2024, all labs will be drawn in the cancer center.  You do not have to check in or register with the main entrance as you have in the past but will complete your check-in in the cancer center.  Wear comfortable clothing and clothing appropriate for easy access to any Portacath or PICC line.   We strive to give you quality time with your provider. You may need to reschedule your appointment if you arrive late (15 or more minutes).  Arriving late affects you and other patients whose appointments are after yours.  Also, if you miss three or more appointments without notifying the office, you may be dismissed from the clinic at the provider's discretion.      For prescription refill requests, have your pharmacy contact our office and allow 72 hours for refills to be completed.    Today you received the following chemotherapy and/or immunotherapy agents Opdivo.  Nivolumab Injection What is this medication? NIVOLUMAB (nye VOL ue mab) treats some types of cancer. It works by helping your immune system slow or stop the spread of cancer cells. It is a monoclonal antibody. This medicine may be used for other purposes; ask your health care provider or pharmacist if you have questions. COMMON BRAND NAME(S): Opdivo What should I tell my care team before I take this medication? They need to know if you have any of these conditions: Allogeneic stem cell transplant (uses someone else's stem cells) Autoimmune diseases, such as Crohn disease, ulcerative colitis, lupus History of chest radiation Nervous system problems, such as Guillain-Barre syndrome or myasthenia gravis Organ transplant An unusual or allergic reaction to nivolumab, other medications, foods, dyes, or  preservatives Pregnant or trying to get pregnant Breast-feeding How should I use this medication? This medication is infused into a vein. It is given in a hospital or clinic setting. A special MedGuide will be given to you before each treatment. Be sure to read this information carefully each time. Talk to your care team about the use of this medication in children. While it may be prescribed for children as young as 12 years for selected conditions, precautions do apply. Overdosage: If you think you have taken too much of this medicine contact a poison control center or emergency room at once. NOTE: This medicine is only for you. Do not share this medicine with others. What if I miss a dose? Keep appointments for follow-up doses. It is important not to miss your dose. Call your care team if you are unable to keep an appointment. What may interact with this medication? Interactions have not been studied. This list may not describe all possible interactions. Give your health care provider a list of all the medicines, herbs, non-prescription drugs, or dietary supplements you use. Also tell them if you smoke, drink alcohol, or use illegal drugs. Some items may interact with your medicine. What should I watch for while using this medication? Your condition will be monitored carefully while you are receiving this medication. You may need blood work while taking this medication. This medication may cause serious skin reactions. They can happen weeks to months after starting the medication. Contact your care team right away if you notice fevers or flu-like symptoms with a rash. The rash may be red   or purple and then turn into blisters or peeling of the skin. You may also notice a red rash with swelling of the face, lips, or lymph nodes in your neck or under your arms. Tell your care team right away if you have any change in your eyesight. Talk to your care team if you are pregnant or think you might be  pregnant. A negative pregnancy test is required before starting this medication. A reliable form of contraception is recommended while taking this medication and for 5 months after the last dose. Talk to your care team about effective forms of contraception. Do not breast-feed while taking this medication and for 5 months after the last dose. What side effects may I notice from receiving this medication? Side effects that you should report to your care team as soon as possible: Allergic reactions--skin rash, itching, hives, swelling of the face, lips, tongue, or throat Dry cough, shortness of breath or trouble breathing Eye pain, redness, irritation, or discharge with blurry or decreased vision Heart muscle inflammation--unusual weakness or fatigue, shortness of breath, chest pain, fast or irregular heartbeat, dizziness, swelling of the ankles, feet, or hands Hormone gland problems--headache, sensitivity to light, unusual weakness or fatigue, dizziness, fast or irregular heartbeat, increased sensitivity to cold or heat, excessive sweating, constipation, hair loss, increased thirst or amount of urine, tremors or shaking, irritability Infusion reactions--chest pain, shortness of breath or trouble breathing, feeling faint or lightheaded Kidney injury (glomerulonephritis)--decrease in the amount of urine, red or dark brown urine, foamy or bubbly urine, swelling of the ankles, hands, or feet Liver injury--right upper belly pain, loss of appetite, nausea, light-colored stool, dark yellow or brown urine, yellowing skin or eyes, unusual weakness or fatigue Pain, tingling, or numbness in the hands or feet, muscle weakness, change in vision, confusion or trouble speaking, loss of balance or coordination, trouble walking, seizures Rash, fever, and swollen lymph nodes Redness, blistering, peeling, or loosening of the skin, including inside the mouth Sudden or severe stomach pain, bloody diarrhea, fever, nausea,  vomiting Side effects that usually do not require medical attention (report these to your care team if they continue or are bothersome): Bone, joint, or muscle pain Diarrhea Fatigue Loss of appetite Nausea Skin rash This list may not describe all possible side effects. Call your doctor for medical advice about side effects. You may report side effects to FDA at 1-800-FDA-1088. Where should I keep my medication? This medication is given in a hospital or clinic. It will not be stored at home. NOTE: This sheet is a summary. It may not cover all possible information. If you have questions about this medicine, talk to your doctor, pharmacist, or health care provider.  2023 Elsevier/Gold Standard (2014-10-01 00:00:00)       To help prevent nausea and vomiting after your treatment, we encourage you to take your nausea medication as directed.  BELOW ARE SYMPTOMS THAT SHOULD BE REPORTED IMMEDIATELY: *FEVER GREATER THAN 100.4 F (38 C) OR HIGHER *CHILLS OR SWEATING *NAUSEA AND VOMITING THAT IS NOT CONTROLLED WITH YOUR NAUSEA MEDICATION *UNUSUAL SHORTNESS OF BREATH *UNUSUAL BRUISING OR BLEEDING *URINARY PROBLEMS (pain or burning when urinating, or frequent urination) *BOWEL PROBLEMS (unusual diarrhea, constipation, pain near the anus) TENDERNESS IN MOUTH AND THROAT WITH OR WITHOUT PRESENCE OF ULCERS (sore throat, sores in mouth, or a toothache) UNUSUAL RASH, SWELLING OR PAIN  UNUSUAL VAGINAL DISCHARGE OR ITCHING   Items with * indicate a potential emergency and should be followed up as soon as possible   or go to the Emergency Department if any problems should occur.  Please show the CHEMOTHERAPY ALERT CARD or IMMUNOTHERAPY ALERT CARD at check-in to the Emergency Department and triage nurse.  Should you have questions after your visit or need to cancel or reschedule your appointment, please contact [facility] [phone]  and follow the prompts.  Office hours are 8:00  a.m. to 4:30 p.m. Monday - Friday. Please note that voicemails left after 4:00 p.m. may not be returned until the following business day.  We are closed weekends and major holidays. You have access to a nurse at all times for urgent questions. Please call the main number to the clinic [phone] and follow the prompts.  For any non-urgent questions, you may also contact your provider using MyChart. We now offer e-Visits for anyone 18 and older to request care online for non-urgent symptoms. For details visit [email or web address].   Also download the MyChart app! Go to the app store, search "MyChart", open the app, select [facility], and log in with your MyChart username and password.   

## 2023-03-02 NOTE — Progress Notes (Signed)
Patients port flushed without difficulty.  Good blood return noted with no bruising or swelling noted at site.  Patient remains accessed for treatment.  

## 2023-03-02 NOTE — Progress Notes (Signed)
Message received from Dr. Ellin Saba / A. Anderson RN to proceed with treatment. Creatinine 2.46 and MD aware and labs reviewed by Dr. Ellin Saba. Vital signs within parameters for treatment.

## 2023-03-02 NOTE — Addendum Note (Signed)
Addended by: Pryor Ochoa E on: 03/02/2023 10:00 AM   Modules accepted: Orders

## 2023-03-03 ENCOUNTER — Other Ambulatory Visit: Payer: Self-pay

## 2023-03-03 LAB — T4: T4, Total: 6.4 ug/dL (ref 4.5–12.0)

## 2023-03-04 LAB — CEA: CEA: 11.4 ng/mL — ABNORMAL HIGH (ref 0.0–4.7)

## 2023-03-08 DIAGNOSIS — I739 Peripheral vascular disease, unspecified: Secondary | ICD-10-CM | POA: Diagnosis not present

## 2023-03-08 DIAGNOSIS — R809 Proteinuria, unspecified: Secondary | ICD-10-CM | POA: Diagnosis not present

## 2023-03-08 DIAGNOSIS — I129 Hypertensive chronic kidney disease with stage 1 through stage 4 chronic kidney disease, or unspecified chronic kidney disease: Secondary | ICD-10-CM | POA: Diagnosis not present

## 2023-03-08 DIAGNOSIS — N184 Chronic kidney disease, stage 4 (severe): Secondary | ICD-10-CM | POA: Diagnosis not present

## 2023-03-08 DIAGNOSIS — C182 Malignant neoplasm of ascending colon: Secondary | ICD-10-CM | POA: Diagnosis not present

## 2023-03-08 DIAGNOSIS — I714 Abdominal aortic aneurysm, without rupture, unspecified: Secondary | ICD-10-CM | POA: Diagnosis not present

## 2023-03-09 ENCOUNTER — Other Ambulatory Visit: Payer: Self-pay | Admitting: Cardiology

## 2023-03-09 ENCOUNTER — Other Ambulatory Visit: Payer: Self-pay | Admitting: Hematology

## 2023-03-09 ENCOUNTER — Other Ambulatory Visit: Payer: Self-pay | Admitting: *Deleted

## 2023-03-09 DIAGNOSIS — C50911 Malignant neoplasm of unspecified site of right female breast: Secondary | ICD-10-CM

## 2023-03-09 NOTE — Telephone Encounter (Signed)
Anastrozole refill approved.  Patient tolerating and is to continue therapy. 

## 2023-03-15 ENCOUNTER — Ambulatory Visit: Payer: Medicare Other | Admitting: Hematology

## 2023-03-15 ENCOUNTER — Other Ambulatory Visit: Payer: Medicare Other

## 2023-03-15 ENCOUNTER — Ambulatory Visit: Payer: Medicare Other

## 2023-03-19 ENCOUNTER — Other Ambulatory Visit: Payer: Self-pay

## 2023-03-19 DIAGNOSIS — C50911 Malignant neoplasm of unspecified site of right female breast: Secondary | ICD-10-CM

## 2023-03-19 MED ORDER — ANASTROZOLE 1 MG PO TABS
1.0000 mg | ORAL_TABLET | Freq: Every day | ORAL | 3 refills | Status: DC
Start: 2023-03-19 — End: 2023-09-15

## 2023-03-19 MED ORDER — LEVOTHYROXINE SODIUM 50 MCG PO TABS: 25.0000 ug | ORAL_TABLET | Freq: Every day | ORAL | 5 refills | Status: DC

## 2023-03-19 MED ORDER — HYDROXYZINE HCL 25 MG PO TABS: ORAL_TABLET | ORAL | 0 refills | Status: DC

## 2023-03-19 MED ORDER — AMLODIPINE BESYLATE 5 MG PO TABS: 5.0000 mg | ORAL_TABLET | Freq: Every day | ORAL | 0 refills | Status: DC

## 2023-03-22 ENCOUNTER — Other Ambulatory Visit: Payer: Self-pay

## 2023-03-25 ENCOUNTER — Other Ambulatory Visit: Payer: Self-pay | Admitting: Hematology

## 2023-03-26 ENCOUNTER — Other Ambulatory Visit: Payer: Self-pay

## 2023-03-27 ENCOUNTER — Other Ambulatory Visit: Payer: Self-pay

## 2023-03-30 ENCOUNTER — Inpatient Hospital Stay: Payer: Medicare Other | Attending: Hematology

## 2023-03-30 ENCOUNTER — Inpatient Hospital Stay (HOSPITAL_BASED_OUTPATIENT_CLINIC_OR_DEPARTMENT_OTHER): Payer: Medicare Other | Admitting: Hematology

## 2023-03-30 ENCOUNTER — Inpatient Hospital Stay: Payer: Medicare Other

## 2023-03-30 VITALS — BP 138/60 | HR 56 | Temp 97.7°F | Resp 18

## 2023-03-30 VITALS — BP 126/63 | HR 67 | Temp 98.4°F | Resp 16

## 2023-03-30 DIAGNOSIS — C773 Secondary and unspecified malignant neoplasm of axilla and upper limb lymph nodes: Secondary | ICD-10-CM | POA: Diagnosis not present

## 2023-03-30 DIAGNOSIS — C642 Malignant neoplasm of left kidney, except renal pelvis: Secondary | ICD-10-CM

## 2023-03-30 DIAGNOSIS — C50911 Malignant neoplasm of unspecified site of right female breast: Secondary | ICD-10-CM | POA: Diagnosis not present

## 2023-03-30 DIAGNOSIS — Z79899 Other long term (current) drug therapy: Secondary | ICD-10-CM | POA: Diagnosis not present

## 2023-03-30 DIAGNOSIS — C652 Malignant neoplasm of left renal pelvis: Secondary | ICD-10-CM | POA: Diagnosis not present

## 2023-03-30 DIAGNOSIS — C182 Malignant neoplasm of ascending colon: Secondary | ICD-10-CM | POA: Insufficient documentation

## 2023-03-30 DIAGNOSIS — R21 Rash and other nonspecific skin eruption: Secondary | ICD-10-CM | POA: Diagnosis not present

## 2023-03-30 DIAGNOSIS — Z79811 Long term (current) use of aromatase inhibitors: Secondary | ICD-10-CM | POA: Diagnosis not present

## 2023-03-30 DIAGNOSIS — Z5112 Encounter for antineoplastic immunotherapy: Secondary | ICD-10-CM | POA: Diagnosis not present

## 2023-03-30 DIAGNOSIS — Z808 Family history of malignant neoplasm of other organs or systems: Secondary | ICD-10-CM | POA: Insufficient documentation

## 2023-03-30 DIAGNOSIS — E039 Hypothyroidism, unspecified: Secondary | ICD-10-CM | POA: Diagnosis not present

## 2023-03-30 DIAGNOSIS — Z170 Estrogen receptor positive status [ER+]: Secondary | ICD-10-CM | POA: Diagnosis not present

## 2023-03-30 DIAGNOSIS — Z95828 Presence of other vascular implants and grafts: Secondary | ICD-10-CM

## 2023-03-30 DIAGNOSIS — Z8041 Family history of malignant neoplasm of ovary: Secondary | ICD-10-CM | POA: Insufficient documentation

## 2023-03-30 DIAGNOSIS — Z87891 Personal history of nicotine dependence: Secondary | ICD-10-CM | POA: Insufficient documentation

## 2023-03-30 DIAGNOSIS — R918 Other nonspecific abnormal finding of lung field: Secondary | ICD-10-CM | POA: Diagnosis not present

## 2023-03-30 LAB — COMPREHENSIVE METABOLIC PANEL
ALT: 12 U/L (ref 0–44)
AST: 17 U/L (ref 15–41)
Albumin: 4 g/dL (ref 3.5–5.0)
Alkaline Phosphatase: 82 U/L (ref 38–126)
Anion gap: 11 (ref 5–15)
BUN: 28 mg/dL — ABNORMAL HIGH (ref 8–23)
CO2: 22 mmol/L (ref 22–32)
Calcium: 9.5 mg/dL (ref 8.9–10.3)
Chloride: 106 mmol/L (ref 98–111)
Creatinine, Ser: 2.56 mg/dL — ABNORMAL HIGH (ref 0.44–1.00)
GFR, Estimated: 18 mL/min — ABNORMAL LOW (ref >60)
Glucose, Bld: 116 mg/dL — ABNORMAL HIGH (ref 70–99)
Potassium: 3.6 mmol/L (ref 3.5–5.1)
Sodium: 139 mmol/L (ref 135–145)
Total Bilirubin: 0.9 mg/dL (ref 0.3–1.2)
Total Protein: 6.9 g/dL (ref 6.5–8.1)

## 2023-03-30 LAB — CBC WITH DIFFERENTIAL/PLATELET
Abs Immature Granulocytes: 0.02 10*3/uL (ref 0.00–0.07)
Basophils Absolute: 0.1 10*3/uL (ref 0.0–0.1)
Basophils Relative: 1 %
Eosinophils Absolute: 0 10*3/uL (ref 0.0–0.5)
Eosinophils Relative: 0 %
HCT: 39.7 % (ref 36.0–46.0)
Hemoglobin: 12.9 g/dL (ref 12.0–15.0)
Immature Granulocytes: 0 %
Lymphocytes Relative: 18 %
Lymphs Abs: 1.1 10*3/uL (ref 0.7–4.0)
MCH: 29.3 pg (ref 26.0–34.0)
MCHC: 32.5 g/dL (ref 30.0–36.0)
MCV: 90.2 fL (ref 80.0–100.0)
Monocytes Absolute: 0.3 10*3/uL (ref 0.1–1.0)
Monocytes Relative: 5 %
Neutro Abs: 4.6 10*3/uL (ref 1.7–7.7)
Neutrophils Relative %: 76 %
Platelets: 169 10*3/uL (ref 150–400)
RBC: 4.4 MIL/uL (ref 3.87–5.11)
RDW: 13.2 % (ref 11.5–15.5)
WBC: 6.1 10*3/uL (ref 4.0–10.5)
nRBC: 0 % (ref 0.0–0.2)

## 2023-03-30 LAB — TSH: TSH: 4.342 u[IU]/mL (ref 0.350–4.500)

## 2023-03-30 LAB — MAGNESIUM: Magnesium: 1.8 mg/dL (ref 1.7–2.4)

## 2023-03-30 MED ORDER — SODIUM CHLORIDE 0.9% FLUSH
10.0000 mL | INTRAVENOUS | Status: DC | PRN
  Administered 2023-03-30: 10 mL

## 2023-03-30 MED ORDER — SODIUM CHLORIDE 0.9% FLUSH
10.0000 mL | Freq: Once | INTRAVENOUS | Status: AC
  Administered 2023-03-30: 10 mL via INTRAVENOUS

## 2023-03-30 MED ORDER — SODIUM CHLORIDE 0.9 % IV SOLN: Freq: Once | INTRAVENOUS | Status: AC

## 2023-03-30 MED ORDER — HEPARIN SOD (PORK) LOCK FLUSH 100 UNIT/ML IV SOLN
500.0000 [IU] | Freq: Once | INTRAVENOUS | Status: AC | PRN
  Administered 2023-03-30: 500 [IU]

## 2023-03-30 MED ORDER — SODIUM CHLORIDE 0.9 % IV SOLN
480.0000 mg | Freq: Once | INTRAVENOUS | Status: AC
  Administered 2023-03-30: 480 mg via INTRAVENOUS
  Filled 2023-03-30: qty 48

## 2023-03-30 NOTE — Patient Instructions (Signed)
[facility] at [facility] Discharge Instructions   You were seen and examined today by Dr. Katragadda.  He reviewed the results of your lab work which are normal/stable.   We will proceed with your treatment today.  Return as scheduled.    Thank you for choosing [facility] at [facility] to provide your oncology and hematology care.  To afford each patient quality time with our provider, please arrive at least 15 minutes before your scheduled appointment time.   If you have a lab appointment with the Cancer Center please come in thru the Main Entrance and check in at the main information desk.  You need to re-schedule your appointment should you arrive 10 or more minutes late.  We strive to give you quality time with our providers, and arriving late affects you and other patients whose appointments are after yours.  Also, if you no show three or more times for appointments you may be dismissed from the clinic at the providers discretion.     Again, thank you for choosing [facility].  Our hope is that these requests will decrease the amount of time that you wait before being seen by our physicians.       _____________________________________________________________  Should you have questions after your visit to [facility], please contact our office at [phone] and follow the prompts.  Our office hours are 8:00 a.m. and 4:30 p.m. Monday - Friday.  Please note that voicemails left after 4:00 p.m. may not be returned until the following business day.  We are closed weekends and major holidays.  You do have access to a nurse 24-7, just call the main number to the clinic [phone] and do not press any options, hold on the line and a nurse will answer the phone.    For prescription refill requests, have your pharmacy contact our office and allow 72 hours.    Due to Covid, you will need to wear a mask upon entering  the hospital. If you do not have a mask, a mask will be given to you at the Main Entrance upon arrival. For doctor visits, patients may have 1 support person age 18 or older with them. For treatment visits, patients can not have anyone with them due to social distancing guidelines and our immunocompromised population.      

## 2023-03-30 NOTE — Progress Notes (Signed)
Patient has been examined by Dr. Ellin Saba. Vital signs and labs have been reviewed by MD - ANC, Creatinine (2.56), LFTs, hemoglobin, and platelets are within treatment parameters per M.D. - pt may proceed with treatment.  Primary RN and pharmacy notified.

## 2023-03-30 NOTE — Progress Notes (Signed)
[facility] [address], [place] [address]    Clinic Day:  03/30/2023  Referring physician: Doreatha Massed, MD  Patient Care Team: Tally Joe, MD as PCP - General (Family Medicine) Jake Bathe, MD as PCP - Cardiology (Cardiology) Karie Soda, MD as Consulting Physician (General Surgery) Jake Bathe, MD as Consulting Physician (Cardiology) Mansouraty, Netty Starring., MD as Consulting Physician (Gastroenterology) Mickie Bail, RN as Oncology Nurse Navigator Doreatha Massed, MD as Consulting Physician (Medical Oncology)   ASSESSMENT & PLAN:   Assessment: 1.  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  Right hemicolectomy on 10/11/2019, grade 3 adenocarcinoma, negative margins, positive LVSI, 2 tumor deposits, 0/15 lymph nodes positive, PT3PN1C. 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  MLH1 hyper methylation present. -PET scan in December 2020 showed lymph node in the right axillary region.  Biopsy consistent with metastatic colon cancer. -Last CEA was 10.7 on 11/22/2019. -Right axillary lymph node excision on 04/02/2020 consistent with metastatic carcinoma from colon cancer. -Pembrolizumab started on 05/14/2020. -PET scan on 08/05/2020 shows complete resolution of lymphadenopathy.  No evidence of new areas of uptake.  Mild vague residual hypermetabolism in the right axilla without soft tissue mass. -PET scan on 12/23/2020 with no evidence of recurrence. Rande Lawman held since we started opdivo on 08/27/2022   2.  Stage I (PT1CPN0) right Breast, Grade 2 Invasive Lobular Carcinoma: -MRI of the breast on 12/25/2019 showed 1 cm mass behind the right areola with a suspicious mass in the left breast. -Right breast lumpectomy on 04/02/2020 shows invasive lobular carcinoma, grade 2, 1.2 cm.  Resection margins are negative.  Negative LVSI.  2 sentinel lymph  nodes were negative for carcinoma.  ER/PR 100% positive, HER-2 negative, Ki-67 10%.   3.  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 - Based on her renal function, she is not a candidate for cisplatin based chemotherapy.  Nivolumab was recommended for 1 year. - PET scan (08/06/2022): No evidence of metastatic disease. - Opdivo for 1 year started on 08/27/2022    Plan: 1.  Stage IV colon cancer to the right axillary lymph node: - Recent CT CAP on 02/08/2023 without evidence of recurrence.  Left lower lobe lung nodule slightly increased in size.  Last CEA was 11.4.   2.  Right breast invasive lobular carcinoma, grade 2: - Last mammogram in May 2023, BI-RADS Category 1. - Continue anastrozole daily.   3.  Blistering rash: - Rash in the left inframammary area is resolved. - There is 1 raised nodule over the left shin, periphery of which is slightly tender.  If there is no improvement in the next 4 weeks, she was told to follow-up with dermatology.   4.  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 - Labs today: Normal LFTs.  Creatinine is 2.56.  CBC normal.  TSH 4.32. - Proceed with nivolumab today and in 4 weeks.  RTC 8 weeks.  Will repeat CT CAP without contrast.   5.  Hypothyroidism: - TSH today is 1.69.  Continue Synthroid 50 mcg daily.    Orders Placed This Encounter  Procedures   CT CHEST ABDOMEN PELVIS WO CONTRAST    Standing Status:   Future    Standing Expiration Date:   03/29/2024    Order Specific  Question:   Preferred imaging location?    Answer:   [facility]    Order Specific Question:   If indicated for the ordered procedure, I authorize the administration of oral contrast media per Radiology protocol    Answer:   Yes    Order Specific Question:   Does the patient have a contrast media/X-ray dye allergy?    Answer:   No      I,Katie Daubenspeck,acting as a scribe for Doreatha Massed, MD.,have documented all relevant documentation on the behalf of Doreatha Massed, MD,as directed by  Doreatha Massed, MD while in the presence of Doreatha Massed, MD.   I, Doreatha Massed MD, have reviewed the above documentation for accuracy and completeness, and I agree with the above.   Doreatha Massed, MD   5/7/20245:27 PM  CHIEF COMPLAINT:   Diagnosis: stage I right breast cancer and colon cancer and left kidney urothelial carcinoma    Cancer Staging  Cancer of ascending colon s/p robotic proximal colectomy 10/11/2019 Staging form: Colon and Rectum, AJCC 8th Edition - Clinical stage from 10/23/2019: Stage IVA (cT3, cN1c, cM1a) - Signed by Doreatha Massed, MD on 12/14/2019  Malignant neoplasm of right female breast [facility]) Staging form: Breast, AJCC 8th Edition - Clinical stage from 04/23/2020: Stage IA (cT1c, cN0(sn), cM0, G2, ER+, PR+, HER2-) - Signed by Doreatha Massed, MD on 04/23/2020  Urothelial carcinoma of kidney, left [facility]) Staging form: Kidney, AJCC 8th Edition - Clinical stage from 08/18/2022: Stage III (cT3, cN0, cM0) - Unsigned    Prior Therapy: 1. Robotic proximal colectomy on 10/11/2019. 2. Right breast lumpectomy and SLNB on 04/02/2020 3.  Left radical nephro ureterectomy, lymphadenectomy, bladder cuff excision on 07/13/2022  Current Therapy:  Opdivo every 4 weeks    HISTORY OF PRESENT ILLNESS:   Oncology History  Cancer of ascending colon s/p robotic proximal colectomy 10/11/2019  10/11/2019 Initial Diagnosis   Cancer of  ascending colon s/p robotic proximal colectomy 10/11/2019   10/23/2019 Cancer Staging   Staging form: Colon and Rectum, AJCC 8th Edition - Clinical stage from 10/23/2019: Stage IVA (cT3, cN1c, cM1a) - Signed by Doreatha Massed, MD on 12/14/2019   05/14/2020 - 06/26/2022 Chemotherapy   Patient is on Treatment Plan : COLORECTAL Pembrolizumab q21d     05/14/2020 - 06/26/2022 Chemotherapy   Patient is on Treatment Plan : COLORECTAL Pembrolizumab (200) q21d     08/27/2022 -  Chemotherapy   Patient is on Treatment Plan : COLORECTAL Nivolumab (480) q28d     Malignant neoplasm of right female breast (HCC)  04/23/2020 Initial Diagnosis   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 (HCC)   04/23/2020 Cancer Staging   Staging form: Breast, AJCC 8th Edition - Clinical stage from 04/23/2020: Stage IA (cT1c, cN0(sn), cM0, G2, ER+, PR+, HER2-) - Signed by Doreatha Massed, MD on 04/23/2020   Urothelial carcinoma of kidney, left (HCC)  08/18/2022 Initial Diagnosis   Urothelial carcinoma of kidney, left (HCC)   08/27/2022 -  Chemotherapy   Patient is on Treatment Plan : COLORECTAL Nivolumab (480) q28d        INTERVAL HISTORY:   Danniela is a 82 y.o. female presenting to clinic today for follow up of stage I right breast  cancer and colon cancer and left kidney urothelial carcinoma. She was last seen by me on 03/02/23.  Today, she states that she is doing well overall. Her appetite level is at 75%. Her energy level is at 75%.  PAST MEDICAL HISTORY:   Past Medical History: Past Medical History:  Diagnosis Date   Abdominal aortic aneurysm (HCC)    Acid reflux    Anemia    Arthritis    knees , R shoulder - tx /w injection - 11/2014   Basal cell carcinoma (BCC) of dorsum of nose 2010   Resolved   Cancer (HCC)    basal cell on nose   Colon cancer (HCC)    Coronary artery disease    Hypercholesteremia    Hypertension    Hypothyroidism    Lt Acute pyelonephritis 09/11/2018   MI, old  2000   Peripheral arterial disease (HCC)    hhigh-grade ostial bilateral calcified iliac stenosis with claudication   Tobacco abuse    Vertigo    when lays on left side.    Surgical History: Past Surgical History:  Procedure Laterality Date   BREAST LUMPECTOMY WITH RADIOACTIVE SEED AND SENTINEL LYMPH NODE BIOPSY Bilateral 04/02/2020   Procedure: BILATERAL BREAST LUMPECTOMY WITH RADIOACTIVE SEED AND RIGHT SENTINEL LYMPH NODE BIOPSY AND RIGHT TARGETED AXILLARY LYMPH NODE BIOPSY;  Surgeon: Harriette Bouillon, MD;  Location: [facility];  Service: General;  Laterality: Bilateral;   CARDIAC CATHETERIZATION  5 stents   CARDIAC CATHETERIZATION N/A 01/20/2016   Procedure: Left Heart Cath and Coronary Angiography;  Surgeon: Peter M Swaziland, MD;  Location: [facility] INVASIVE CV LAB;  Service: Cardiovascular;  Laterality: N/A;   CATARACT EXTRACTION W/PHACO Left 05/24/2014   Procedure: CATARACT EXTRACTION PHACO AND INTRAOCULAR LENS PLACEMENT (IOC);  Surgeon: Gemma Payor, MD;  Location: [facility];  Service: Ophthalmology;  Laterality: Left;  CDE:  9.30   CATARACT EXTRACTION W/PHACO Right 06/18/2014   Procedure: CATARACT EXTRACTION PHACO AND INTRAOCULAR LENS PLACEMENT RIGHT EYE CDE=10.84;  Surgeon: Gemma Payor, MD;  Location: [facility];  Service: Ophthalmology;  Laterality: Right;   CORONARY ARTERY BYPASS GRAFT N/A 01/24/2016   Procedure: CORONARY ARTERY BYPASS GRAFTING (CABG);  Surgeon: Alleen Borne, MD;  Location: [facility] OR;  Service: Open Heart Surgery;  Laterality: N/A;   CORONARY STENT PLACEMENT     CORONARY STENT PLACEMENT  03/06/15   CFX DES   CYSTOSCOPY/URETEROSCOPY/HOLMIUM LASER/STENT PLACEMENT Left 09/12/2018   Procedure: CYSTOSCOPY/URETEROSCOPY/STENT PLACEMENT;  Surgeon: Rene Paci, MD;  Location: [facility] ORS;  Service: Urology;  Laterality: Left;   EYE SURGERY     LEFT HEART CATH AND CORS/GRAFTS ANGIOGRAPHY N/A 09/12/2019   Procedure: LEFT HEART CATH AND CORS/GRAFTS ANGIOGRAPHY;  Surgeon:  Lennette Bihari, MD;  Location: [facility] INVASIVE CV LAB;  Service: Cardiovascular;  Laterality: N/A;   LEFT HEART CATHETERIZATION WITH CORONARY ANGIOGRAM N/A 03/06/2015   Procedure: LEFT HEART CATHETERIZATION WITH CORONARY ANGIOGRAM;  Surgeon: Runell Gess, MD;  Location: [facility] CATH LAB;  Service: Cardiovascular;  Laterality: N/A;   PARTIAL KNEE ARTHROPLASTY Right 02/04/2015   Procedure: UNICOMPARTMENTAL KNEE;  Surgeon: Jodi Geralds, MD;  Location: [facility] OR;  Service: Orthopedics;  Laterality: Right;   PERIPHERAL VASCULAR CATHETERIZATION Bilateral 05/13/2015   Procedure: Lower Extremity Angiography;  Surgeon: Runell Gess, MD;  Location: [facility] INVASIVE CV LAB;  Service: Cardiovascular;  Laterality: Bilateral;   PERIPHERAL VASCULAR CATHETERIZATION N/A 05/13/2015   Procedure: Abdominal Aortogram;  Surgeon: Runell Gess, MD;  Location: [facility] INVASIVE CV LAB;  Service: Cardiovascular;  Laterality: N/A;   PERIPHERAL VASCULAR CATHETERIZATION Bilateral 06/27/2015   Procedure: Peripheral Vascular Intervention;  Surgeon: Runell Gess, MD;  Location: [facility] INVASIVE CV LAB;  Service: Cardiovascular;  Laterality: Bilateral;  ILIACS   PERIPHERAL VASCULAR CATHETERIZATION Bilateral 06/27/2015   Procedure: Peripheral Vascular Atherectomy;  Surgeon: Runell Gess, MD;  Location: [facility] INVASIVE CV LAB;  Service: Cardiovascular;  Laterality: Bilateral;   PORTACATH PLACEMENT Left 05/24/2020   Procedure: INSERTION PORT-A-CATH;  Surgeon: Franky Macho, MD;  Location: [facility];  Service: General;  Laterality: Left;   TEE WITHOUT CARDIOVERSION N/A 01/24/2016   Procedure: TRANSESOPHAGEAL ECHOCARDIOGRAM (TEE);  Surgeon: Alleen Borne, MD;  Location: [facility] OR;  Service: Open Heart Surgery;  Laterality: N/A;   TONSILLECTOMY     age 82   TUBAL LIGATION      Social History: Social History   Socioeconomic History   Marital status: Married    Spouse name: Tommy   Number of children: 4   Years of education: Not on file   Highest education  level: Not on file  Occupational History   Occupation: retired    Comment: worked as Tree surgeon for [employer]  Tobacco Use   Smoking status: Former    Packs/day: 1.50    Years: 42.00    Additional pack years: 0.00    Total pack years: 63.00    Types: Cigarettes    Quit date: 05/19/1999    Years since quitting: 23.8   Smokeless tobacco: Never  Vaping Use   Vaping Use: Never used  Substance and Sexual Activity   Alcohol use: No   Drug use: No   Sexual activity: Yes    Birth control/protection: Surgical  Other Topics Concern   Not on file  Social History Narrative   Not on file   Social Determinants of Health   Financial Resource Strain: Medium Risk (02/18/2021)   Overall Financial Resource Strain (CARDIA)    Difficulty of Paying Living Expenses: Somewhat hard  Food Insecurity: No Food Insecurity (02/18/2021)   Hunger Vital Sign    Worried About Running Out of Food in the Last Year: Never true    Ran Out of Food in the Last Year: Never true  Transportation Needs: No Transportation Needs (02/18/2021)   PRAPARE - Administrator, Civil Service (Medical): No    Lack of Transportation (Non-Medical): No  Physical Activity: Inactive (02/18/2021)   Exercise Vital Sign    Days of Exercise per Week: 0 days    Minutes of Exercise per Session: 0 min  Stress: No Stress Concern Present (02/18/2021)   [employer] of Occupational Health - Occupational Stress Questionnaire    Feeling of Stress : Not at all  Social Connections: Moderately Isolated (02/18/2021)   Social Connection and Isolation Panel [NHANES]    Frequency of Communication with Friends and Family: More than three times a week    Frequency of Social Gatherings with Friends and Family: Three times a week    Attends Religious Services: Never    Active Member of Clubs or Organizations: No    Attends Banker Meetings: Never    Marital Status: Married  Catering manager Violence: Not At  Risk (02/18/2021)   Humiliation, Afraid, Rape, and Kick questionnaire    Fear of Current or Ex-Partner: No    Emotionally Abused: No    Physically Abused: No    Sexually Abused: No    Family History: Family History  Problem Relation  Age of Onset   Ovarian cancer Mother 74   Cancer Father 58       unsure of which kind, "it was in his glands"   Hypertension Maternal Grandmother    Stroke Maternal Grandfather    Hypertension Son    Brain cancer Sister    Hypertension Daughter    Heart attack Neg Hx    Colon cancer Neg Hx    Esophageal cancer Neg Hx    Inflammatory bowel disease Neg Hx    Liver disease Neg Hx    Pancreatic cancer Neg Hx    Rectal cancer Neg Hx    Stomach cancer Neg Hx     Current Medications:  Current Outpatient Medications:    amLODipine (NORVASC) 5 MG tablet, Take 1 tablet (5 mg total) by mouth daily., Disp: 30 tablet, Rfl: 0   anastrozole (ARIMIDEX) 1 MG tablet, Take 1 tablet (1 mg total) by mouth daily., Disp: 30 tablet, Rfl: 3   aspirin EC 81 MG tablet, Take 81 mg by mouth every evening. , Disp: , Rfl:    atorvastatin (LIPITOR) 40 MG tablet, TAKE (1) TABLET BY MOUTH ONCE DAILY. KEEP FUTURE APPOINTMENTS FOR ANY FURTHER REFILLS., Disp: 90 tablet, Rfl: 3   betamethasone dipropionate 0.05 % cream, Apply topically 2 (two) times daily., Disp: 30 g, Rfl: 0   clopidogrel (PLAVIX) 75 MG tablet, TAKE (1) TABLET BY MOUTH ONCE DAILY. PLEASE KEEP UPCOMING APPOINTMENTS FOR FUTURE REFILLS., Disp: 30 tablet, Rfl: 0   esomeprazole (NEXIUM) 20 MG capsule, Take 20 mg by mouth daily. , Disp: , Rfl:    HYDROcodone-acetaminophen (NORCO/VICODIN) 5-325 MG tablet, Take 1 tablet by mouth daily., Disp: 15 tablet, Rfl: 0   hydrOXYzine (ATARAX) 25 MG tablet, TAKE (1) TABLET BY MOUTH AT BEDTIME AS NEEDED, Disp: 30 tablet, Rfl: 0   levothyroxine (SYNTHROID) 50 MCG tablet, Take 0.5 tablets (25 mcg total) by mouth daily., Disp: 30 tablet, Rfl: 5   metoprolol tartrate (LOPRESSOR) 25 MG  tablet, TAKE 1 TABLET BY MOUTH TWICE DAILY., Disp: 160 tablet, Rfl: 3   nitroGLYCERIN (NITROSTAT) 0.4 MG SL tablet, DISSOLVE 1 TABLET SUBLINGUALLY AS NEEDED FOR CHEST PAIN, MAY REPEAT EVERY 5 MINUTES. AFTER 3 CALL 911., Disp: 25 tablet, Rfl: 4   nivolumab (OPDIVO) 100 MG/10ML SOLN chemo injection, as directed Intravenous once a month, Disp: , Rfl:    Vitamin D, Ergocalciferol, (DRISDOL) 1.25 MG (50000 UNIT) CAPS capsule, TAKE 1 CAPSULE BY MOUTH ONCE A WEEK., Disp: 4 capsule, Rfl: 0 No current facility-administered medications for this visit.  Facility-Administered Medications Ordered in Other Visits:    sodium chloride flush (NS) 0.9 % injection 10 mL, 10 mL, Intracatheter, PRN, Doreatha Massed, MD, 10 mL at 11/26/22 1631   sodium chloride flush (NS) 0.9 % injection 10 mL, 10 mL, Intracatheter, PRN, Doreatha Massed, MD, 10 mL at 03/02/23 1120   sodium chloride flush (NS) 0.9 % injection 10 mL, 10 mL, Intracatheter, PRN, Doreatha Massed, MD, 10 mL at 03/30/23 1508   Allergies: Allergies  Allergen Reactions   Crab [Shellfish Allergy] Nausea And Vomiting    Throws up violently   Ezetimibe Diarrhea   Keflex [Cephalexin]     Caused sores   Other Nausea And Vomiting    Shrimp   Shrimp Extract     Other Reaction(s): GI Intolerance    REVIEW OF SYSTEMS:   Review of Systems  Constitutional:  Negative for chills, fatigue and fever.  HENT:   Positive for trouble swallowing. Negative for  lump/mass, mouth sores, nosebleeds and sore throat.   Eyes:  Negative for eye problems.  Respiratory:  Positive for shortness of breath. Negative for cough.   Cardiovascular:  Negative for chest pain, leg swelling and palpitations.  Gastrointestinal:  Negative for abdominal pain, constipation, diarrhea, nausea and vomiting.  Genitourinary:  Negative for bladder incontinence, difficulty urinating, dysuria, frequency, hematuria and nocturia.   Musculoskeletal:  Negative for arthralgias, back pain,  flank pain, myalgias and neck pain.  Skin:  Negative for itching and rash.  Neurological:  Negative for dizziness, headaches and numbness.  Hematological:  Does not bruise/bleed easily.  Psychiatric/Behavioral:  Positive for sleep disturbance. Negative for depression and suicidal ideas. The patient is not nervous/anxious.   All other systems reviewed and are negative.    VITALS:   There were no vitals taken for this visit.  Wt Readings from Last 3 Encounters:  03/02/23 156 lb 3.2 oz (70.9 kg)  02/15/23 154 lb 12.8 oz (70.2 kg)  01/18/23 154 lb 3.2 oz (69.9 kg)    There is no height or weight on file to calculate BMI.  Performance status (ECOG): 1 - Symptomatic but completely ambulatory  PHYSICAL EXAM:   Physical Exam Vitals and nursing note reviewed. Exam conducted with a chaperone present.  Constitutional:      Appearance: Normal appearance.  Cardiovascular:     Rate and Rhythm: Normal rate and regular rhythm.     Pulses: Normal pulses.     Heart sounds: Normal heart sounds.  Pulmonary:     Effort: Pulmonary effort is normal.     Breath sounds: Normal breath sounds.  Abdominal:     Palpations: Abdomen is soft. There is no hepatomegaly, splenomegaly or mass.     Tenderness: There is no abdominal tenderness.  Musculoskeletal:     Right lower leg: No edema.     Left lower leg: No edema.  Lymphadenopathy:     Cervical: No cervical adenopathy.     Right cervical: No superficial, deep or posterior cervical adenopathy.    Left cervical: No superficial, deep or posterior cervical adenopathy.     Upper Body:     Right upper body: No supraclavicular or axillary adenopathy.     Left upper body: No supraclavicular or axillary adenopathy.  Neurological:     General: No focal deficit present.     Mental Status: She is alert and oriented to person, place, and time.  Psychiatric:        Mood and Affect: Mood normal.        Behavior: Behavior normal.     LABS:      Latest  Ref Rng & Units 03/30/2023   12:16 PM 03/02/2023    9:02 AM 02/15/2023   10:06 AM  CBC  WBC 4.0 - 10.5 K/uL 6.1  5.0  5.2   Hemoglobin 12.0 - 15.0 g/dL 16.1  09.6  04.5   Hematocrit 36.0 - 46.0 % 39.7  39.5  41.0   Platelets 150 - 400 K/uL 169  159  163       Latest Ref Rng & Units 03/30/2023   12:16 PM 03/02/2023    9:02 AM 02/15/2023   10:06 AM  CMP  Glucose 70 - 99 mg/dL 409  811  914   BUN 8 - 23 mg/dL 28  28  30    Creatinine 0.44 - 1.00 mg/dL 7.82  9.56  2.13   Sodium 135 - 145 mmol/L 139  137  141  Potassium 3.5 - 5.1 mmol/L 3.6  4.1  3.8   Chloride 98 - 111 mmol/L 106  108  109   CO2 22 - 32 mmol/L 22  21  22    Calcium 8.9 - 10.3 mg/dL 9.5  9.4  9.5   Total Protein 6.5 - 8.1 g/dL 6.9  6.7  6.6   Total Bilirubin 0.3 - 1.2 mg/dL 0.9  0.5  0.6   Alkaline Phos 38 - 126 U/L 82  89  80   AST 15 - 41 U/L 17  16  19    ALT 0 - 44 U/L 12  14  15       Lab Results  Component Value Date   CEA1 11.4 (H) 03/02/2023   CEA 39.5 (H) 08/09/2019   /  CEA  Date Value Ref Range Status  03/02/2023 11.4 (H) 0.0 - 4.7 ng/mL Final    Comment:    (NOTE)                             Nonsmokers          <3.9                             Smokers             <5.6 Roche [employer] Electrochemiluminescence Immunoassay (ECLIA) Values obtained with different assay methods or kits cannot be used interchangeably.  Results cannot be interpreted as absolute evidence of the presence or absence of malignant disease. Performed At: [facility] [address] [place], [place] [id number] Jolene Schimke MD WU:[id number]   08/09/2019 39.5 (H) ng/mL Final    Comment:    Non-Smoker: <2.5 Smoker:     <5.0 . . This test was performed using the Siemens  chemiluminescent method. Values obtained from different assay methods cannot be used interchangeably. CEA levels, regardless of value, should not be interpreted as absolute evidence of the presence or absence of disease. .    No results found  for: "PSA1" No results found for: "NFA213" No results found for: "CAN125"  No results found for: "TOTALPROTELP", "ALBUMINELP", "A1GS", "A2GS", "BETS", "BETA2SER", "GAMS", "MSPIKE", "SPEI" Lab Results  Component Value Date   TIBC 357 06/04/2022   TIBC 269 11/22/2019   TIBC 271 10/23/2019   FERRITIN 10 (L) 06/04/2022   FERRITIN 230 11/22/2019   FERRITIN 47 10/23/2019   IRONPCTSAT 9 (L) 06/04/2022   IRONPCTSAT 31 11/22/2019   IRONPCTSAT 11 10/23/2019   No results found for: "LDH"   STUDIES:   No results found.

## 2023-03-30 NOTE — Progress Notes (Signed)
Patient presents today for Opdivo infusion. Patient is in satisfactory condition with no new complaints voiced.  Vital signs are stable.  Labs reviewed by Dr. Ellin Saba during the office visit and all other labs are within treatment parameters. Pt's creatinine is 2.56 today, pt will receive 500 mL over 1 hour per Dr.K.  We will proceed with treatment per MD orders.   Opdivo given today per MD orders. Tolerated infusion without adverse affects. Vital signs stable. No complaints at this time. Discharged from clinic ambulatory in stable condition. Alert and oriented x 3. F/U with [facility] as scheduled.

## 2023-03-30 NOTE — Patient Instructions (Signed)
[facility]  Discharge Instructions: Thank you for choosing [facility] to provide your oncology and hematology care.  If you have a lab appointment with the Cancer Center - please note that after April 8th, 2024, all labs will be drawn in the cancer center.  You do not have to check in or register with the main entrance as you have in the past but will complete your check-in in the cancer center.  Wear comfortable clothing and clothing appropriate for easy access to any Portacath or PICC line.   We strive to give you quality time with your provider. You may need to reschedule your appointment if you arrive late (15 or more minutes).  Arriving late affects you and other patients whose appointments are after yours.  Also, if you miss three or more appointments without notifying the office, you may be dismissed from the clinic at the provider's discretion.      For prescription refill requests, have your pharmacy contact our office and allow 72 hours for refills to be completed.    Today you received the following chemotherapy and/or immunotherapy agents Opdivo and 500 mL of normal saline.  Nivolumab Injection What is this medication? NIVOLUMAB (nye VOL ue mab) treats some types of cancer. It works by helping your immune system slow or stop the spread of cancer cells. It is a monoclonal antibody. This medicine may be used for other purposes; ask your health care provider or pharmacist if you have questions. COMMON BRAND NAME(S): Opdivo What should I tell my care team before I take this medication? They need to know if you have any of these conditions: Allogeneic stem cell transplant (uses someone else's stem cells) Autoimmune diseases, such as Crohn disease, ulcerative colitis, lupus History of chest radiation Nervous system problems, such as Guillain-Barre syndrome or myasthenia gravis Organ transplant An unusual or allergic reaction to nivolumab, other  medications, foods, dyes, or preservatives Pregnant or trying to get pregnant Breast-feeding How should I use this medication? This medication is infused into a vein. It is given in a hospital or clinic setting. A special MedGuide will be given to you before each treatment. Be sure to read this information carefully each time. Talk to your care team about the use of this medication in children. While it may be prescribed for children as young as 12 years for selected conditions, precautions do apply. Overdosage: If you think you have taken too much of this medicine contact a poison control center or emergency room at once. NOTE: This medicine is only for you. Do not share this medicine with others. What if I miss a dose? Keep appointments for follow-up doses. It is important not to miss your dose. Call your care team if you are unable to keep an appointment. What may interact with this medication? Interactions have not been studied. This list may not describe all possible interactions. Give your health care provider a list of all the medicines, herbs, non-prescription drugs, or dietary supplements you use. Also tell them if you smoke, drink alcohol, or use illegal drugs. Some items may interact with your medicine. What should I watch for while using this medication? Your condition will be monitored carefully while you are receiving this medication. You may need blood work while taking this medication. This medication may cause serious skin reactions. They can happen weeks to months after starting the medication. Contact your care team right away if you notice fevers or flu-like symptoms with a  rash. The rash may be red or purple and then turn into blisters or peeling of the skin. You may also notice a red rash with swelling of the face, lips, or lymph nodes in your neck or under your arms. Tell your care team right away if you have any change in your eyesight. Talk to your care team if you are  pregnant or think you might be pregnant. A negative pregnancy test is required before starting this medication. A reliable form of contraception is recommended while taking this medication and for 5 months after the last dose. Talk to your care team about effective forms of contraception. Do not breast-feed while taking this medication and for 5 months after the last dose. What side effects may I notice from receiving this medication? Side effects that you should report to your care team as soon as possible: Allergic reactions--skin rash, itching, hives, swelling of the face, lips, tongue, or throat Dry cough, shortness of breath or trouble breathing Eye pain, redness, irritation, or discharge with blurry or decreased vision Heart muscle inflammation--unusual weakness or fatigue, shortness of breath, chest pain, fast or irregular heartbeat, dizziness, swelling of the ankles, feet, or hands Hormone gland problems--headache, sensitivity to light, unusual weakness or fatigue, dizziness, fast or irregular heartbeat, increased sensitivity to cold or heat, excessive sweating, constipation, hair loss, increased thirst or amount of urine, tremors or shaking, irritability Infusion reactions--chest pain, shortness of breath or trouble breathing, feeling faint or lightheaded Kidney injury (glomerulonephritis)--decrease in the amount of urine, red or dark brown urine, foamy or bubbly urine, swelling of the ankles, hands, or feet Liver injury--right upper belly pain, loss of appetite, nausea, light-colored stool, dark yellow or brown urine, yellowing skin or eyes, unusual weakness or fatigue Pain, tingling, or numbness in the hands or feet, muscle weakness, change in vision, confusion or trouble speaking, loss of balance or coordination, trouble walking, seizures Rash, fever, and swollen lymph nodes Redness, blistering, peeling, or loosening of the skin, including inside the mouth Sudden or severe stomach pain,  bloody diarrhea, fever, nausea, vomiting Side effects that usually do not require medical attention (report these to your care team if they continue or are bothersome): Bone, joint, or muscle pain Diarrhea Fatigue Loss of appetite Nausea Skin rash This list may not describe all possible side effects. Call your doctor for medical advice about side effects. You may report side effects to FDA at 1-800-FDA-1088. Where should I keep my medication? This medication is given in a hospital or clinic. It will not be stored at home. NOTE: This sheet is a summary. It may not cover all possible information. If you have questions about this medicine, talk to your doctor, pharmacist, or health care provider.  2023 Elsevier/Gold Standard (2014-10-01 00:00:00)    To help prevent nausea and vomiting after your treatment, we encourage you to take your nausea medication as directed.  BELOW ARE SYMPTOMS THAT SHOULD BE REPORTED IMMEDIATELY: *FEVER GREATER THAN 100.4 F (38 C) OR HIGHER *CHILLS OR SWEATING *NAUSEA AND VOMITING THAT IS NOT CONTROLLED WITH YOUR NAUSEA MEDICATION *UNUSUAL SHORTNESS OF BREATH *UNUSUAL BRUISING OR BLEEDING *URINARY PROBLEMS (pain or burning when urinating, or frequent urination) *BOWEL PROBLEMS (unusual diarrhea, constipation, pain near the anus) TENDERNESS IN MOUTH AND THROAT WITH OR WITHOUT PRESENCE OF ULCERS (sore throat, sores in mouth, or a toothache) UNUSUAL RASH, SWELLING OR PAIN  UNUSUAL VAGINAL DISCHARGE OR ITCHING   Items with * indicate a potential emergency and should be followed up as  soon as possible or go to the Emergency Department if any problems should occur.  Please show the CHEMOTHERAPY ALERT CARD or IMMUNOTHERAPY ALERT CARD at check-in to the Emergency Department and triage nurse.  Should you have questions after your visit or need to cancel or reschedule your appointment, please contact [facility] [phone]  and follow the  prompts.  Office hours are 8:00 a.m. to 4:30 p.m. Monday - Friday. Please note that voicemails left after 4:00 p.m. may not be returned until the following business day.  We are closed weekends and major holidays. You have access to a nurse at all times for urgent questions. Please call the main number to the clinic [phone] and follow the prompts.  For any non-urgent questions, you may also contact your provider using MyChart. We now offer e-Visits for anyone 57 and older to request care online for non-urgent symptoms. For details visit [email or web address].   Also download the MyChart app! Go to the app store, search "MyChart", open the app, select [place], and log in with your MyChart username and password.

## 2023-04-01 LAB — T4: T4, Total: 5.6 ug/dL (ref 4.5–12.0)

## 2023-04-19 ENCOUNTER — Other Ambulatory Visit: Payer: Self-pay

## 2023-04-20 ENCOUNTER — Other Ambulatory Visit: Payer: Self-pay | Admitting: Hematology

## 2023-04-26 ENCOUNTER — Other Ambulatory Visit: Payer: Self-pay | Admitting: Hematology

## 2023-04-27 ENCOUNTER — Other Ambulatory Visit: Payer: Medicare Other

## 2023-04-27 ENCOUNTER — Inpatient Hospital Stay: Payer: Medicare Other | Attending: Hematology

## 2023-04-27 ENCOUNTER — Ambulatory Visit: Payer: Medicare Other | Admitting: Hematology

## 2023-04-27 ENCOUNTER — Inpatient Hospital Stay: Payer: Medicare Other

## 2023-04-27 VITALS — BP 158/67 | HR 52 | Resp 16

## 2023-04-27 VITALS — BP 131/63 | HR 63 | Temp 98.0°F | Resp 20 | Wt 158.4 lb

## 2023-04-27 DIAGNOSIS — C182 Malignant neoplasm of ascending colon: Secondary | ICD-10-CM | POA: Insufficient documentation

## 2023-04-27 DIAGNOSIS — C642 Malignant neoplasm of left kidney, except renal pelvis: Secondary | ICD-10-CM

## 2023-04-27 DIAGNOSIS — Z170 Estrogen receptor positive status [ER+]: Secondary | ICD-10-CM | POA: Diagnosis not present

## 2023-04-27 DIAGNOSIS — Z5112 Encounter for antineoplastic immunotherapy: Secondary | ICD-10-CM | POA: Insufficient documentation

## 2023-04-27 DIAGNOSIS — Z95828 Presence of other vascular implants and grafts: Secondary | ICD-10-CM

## 2023-04-27 DIAGNOSIS — C773 Secondary and unspecified malignant neoplasm of axilla and upper limb lymph nodes: Secondary | ICD-10-CM | POA: Diagnosis not present

## 2023-04-27 DIAGNOSIS — C652 Malignant neoplasm of left renal pelvis: Secondary | ICD-10-CM | POA: Insufficient documentation

## 2023-04-27 DIAGNOSIS — Z79620 Long term (current) use of immunosuppressive biologic: Secondary | ICD-10-CM | POA: Insufficient documentation

## 2023-04-27 DIAGNOSIS — C50911 Malignant neoplasm of unspecified site of right female breast: Secondary | ICD-10-CM | POA: Diagnosis not present

## 2023-04-27 DIAGNOSIS — Z79811 Long term (current) use of aromatase inhibitors: Secondary | ICD-10-CM | POA: Insufficient documentation

## 2023-04-27 LAB — CBC WITH DIFFERENTIAL/PLATELET
Abs Immature Granulocytes: 0.02 10*3/uL (ref 0.00–0.07)
Basophils Absolute: 0.1 10*3/uL (ref 0.0–0.1)
Basophils Relative: 1 %
Eosinophils Absolute: 0.1 10*3/uL (ref 0.0–0.5)
Eosinophils Relative: 2 %
HCT: 39.9 % (ref 36.0–46.0)
Hemoglobin: 12.7 g/dL (ref 12.0–15.0)
Immature Granulocytes: 0 %
Lymphocytes Relative: 27 %
Lymphs Abs: 1.3 10*3/uL (ref 0.7–4.0)
MCH: 28.7 pg (ref 26.0–34.0)
MCHC: 31.8 g/dL (ref 30.0–36.0)
MCV: 90.1 fL (ref 80.0–100.0)
Monocytes Absolute: 0.3 10*3/uL (ref 0.1–1.0)
Monocytes Relative: 7 %
Neutro Abs: 3 10*3/uL (ref 1.7–7.7)
Neutrophils Relative %: 63 %
Platelets: 148 10*3/uL — ABNORMAL LOW (ref 150–400)
RBC: 4.43 MIL/uL (ref 3.87–5.11)
RDW: 13.3 % (ref 11.5–15.5)
WBC: 4.9 10*3/uL (ref 4.0–10.5)
nRBC: 0 % (ref 0.0–0.2)

## 2023-04-27 LAB — MAGNESIUM: Magnesium: 1.9 mg/dL (ref 1.7–2.4)

## 2023-04-27 LAB — COMPREHENSIVE METABOLIC PANEL
ALT: 12 U/L (ref 0–44)
AST: 17 U/L (ref 15–41)
Albumin: 3.9 g/dL (ref 3.5–5.0)
Alkaline Phosphatase: 89 U/L (ref 38–126)
Anion gap: 8 (ref 5–15)
BUN: 29 mg/dL — ABNORMAL HIGH (ref 8–23)
CO2: 22 mmol/L (ref 22–32)
Calcium: 9.1 mg/dL (ref 8.9–10.3)
Chloride: 106 mmol/L (ref 98–111)
Creatinine, Ser: 2.4 mg/dL — ABNORMAL HIGH (ref 0.44–1.00)
GFR, Estimated: 19 mL/min — ABNORMAL LOW (ref >60)
Glucose, Bld: 103 mg/dL — ABNORMAL HIGH (ref 70–99)
Potassium: 3.8 mmol/L (ref 3.5–5.1)
Sodium: 136 mmol/L (ref 135–145)
Total Bilirubin: 0.7 mg/dL (ref 0.3–1.2)
Total Protein: 6.7 g/dL (ref 6.5–8.1)

## 2023-04-27 LAB — TSH: TSH: 6.014 u[IU]/mL — ABNORMAL HIGH (ref 0.350–4.500)

## 2023-04-27 MED ORDER — SODIUM CHLORIDE 0.9% FLUSH
10.0000 mL | INTRAVENOUS | Status: DC | PRN
  Administered 2023-04-27: 10 mL

## 2023-04-27 MED ORDER — SODIUM CHLORIDE 0.9 % IV SOLN
480.0000 mg | Freq: Once | INTRAVENOUS | Status: AC
  Administered 2023-04-27: 480 mg via INTRAVENOUS
  Filled 2023-04-27: qty 48

## 2023-04-27 MED ORDER — HEPARIN SOD (PORK) LOCK FLUSH 100 UNIT/ML IV SOLN
500.0000 [IU] | Freq: Once | INTRAVENOUS | Status: AC | PRN
  Administered 2023-04-27: 500 [IU]

## 2023-04-27 MED ORDER — SODIUM CHLORIDE 0.9 % IV SOLN: Freq: Once | INTRAVENOUS | Status: AC

## 2023-04-27 MED ORDER — SODIUM CHLORIDE 0.9% FLUSH
10.0000 mL | Freq: Once | INTRAVENOUS | Status: AC
  Administered 2023-04-27: 10 mL via INTRAVENOUS

## 2023-04-27 NOTE — Progress Notes (Signed)
Patient presents today for chemotherapy infusion.  Patient is in satisfactory condition with no new complaints voiced.  Vital signs are stable.  Labs reviewed and all labs are within treatment parameters with exception of creatinine 2.40 and TSH 6.014.  Dr Ellin Saba made aware, approval given to continue with treatment plan given. We will proceed with treatment per MD orders.    Patient tolerated treatment well with no complaints voiced.  Patient left ambulatory in stable condition.  Vital signs stable at discharge.  Follow up as scheduled.

## 2023-04-27 NOTE — Progress Notes (Addendum)
OK to treat with elevated SCr (2.4) and elevated TSH (6.014) per MD.  Richardean Sale, RPH, BCPS, BCOP 04/27/2023 12:57 PM

## 2023-04-27 NOTE — Patient Instructions (Signed)
[facility]  Discharge Instructions: Thank you for choosing [facility] to provide your oncology and hematology care.  If you have a lab appointment with the Cancer Center - please note that after April 8th, 2024, all labs will be drawn in the cancer center.  You do not have to check in or register with the main entrance as you have in the past but will complete your check-in in the cancer center.  Wear comfortable clothing and clothing appropriate for easy access to any Portacath or PICC line.   We strive to give you quality time with your provider. You may need to reschedule your appointment if you arrive late (15 or more minutes).  Arriving late affects you and other patients whose appointments are after yours.  Also, if you miss three or more appointments without notifying the office, you may be dismissed from the clinic at the provider's discretion.      For prescription refill requests, have your pharmacy contact our office and allow 72 hours for refills to be completed.    Today you received the following chemotherapy and/or immunotherapy agents Opdivo.  Nivolumab Injection What is this medication? NIVOLUMAB (nye VOL ue mab) treats some types of cancer. It works by helping your immune system slow or stop the spread of cancer cells. It is a monoclonal antibody. This medicine may be used for other purposes; ask your health care provider or pharmacist if you have questions. COMMON BRAND NAME(S): Opdivo What should I tell my care team before I take this medication? They need to know if you have any of these conditions: Allogeneic stem cell transplant (uses someone else's stem cells) Autoimmune diseases, such as Crohn disease, ulcerative colitis, lupus History of chest radiation Nervous system problems, such as Guillain-Barre syndrome or myasthenia gravis Organ transplant An unusual or allergic reaction to nivolumab, other medications, foods, dyes, or  preservatives Pregnant or trying to get pregnant Breast-feeding How should I use this medication? This medication is infused into a vein. It is given in a hospital or clinic setting. A special MedGuide will be given to you before each treatment. Be sure to read this information carefully each time. Talk to your care team about the use of this medication in children. While it may be prescribed for children as young as 12 years for selected conditions, precautions do apply. Overdosage: If you think you have taken too much of this medicine contact a poison control center or emergency room at once. NOTE: This medicine is only for you. Do not share this medicine with others. What if I miss a dose? Keep appointments for follow-up doses. It is important not to miss your dose. Call your care team if you are unable to keep an appointment. What may interact with this medication? Interactions have not been studied. This list may not describe all possible interactions. Give your health care provider a list of all the medicines, herbs, non-prescription drugs, or dietary supplements you use. Also tell them if you smoke, drink alcohol, or use illegal drugs. Some items may interact with your medicine. What should I watch for while using this medication? Your condition will be monitored carefully while you are receiving this medication. You may need blood work while taking this medication. This medication may cause serious skin reactions. They can happen weeks to months after starting the medication. Contact your care team right away if you notice fevers or flu-like symptoms with a rash. The rash may be red  or purple and then turn into blisters or peeling of the skin. You may also notice a red rash with swelling of the face, lips, or lymph nodes in your neck or under your arms. Tell your care team right away if you have any change in your eyesight. Talk to your care team if you are pregnant or think you might be  pregnant. A negative pregnancy test is required before starting this medication. A reliable form of contraception is recommended while taking this medication and for 5 months after the last dose. Talk to your care team about effective forms of contraception. Do not breast-feed while taking this medication and for 5 months after the last dose. What side effects may I notice from receiving this medication? Side effects that you should report to your care team as soon as possible: Allergic reactions--skin rash, itching, hives, swelling of the face, lips, tongue, or throat Dry cough, shortness of breath or trouble breathing Eye pain, redness, irritation, or discharge with blurry or decreased vision Heart muscle inflammation--unusual weakness or fatigue, shortness of breath, chest pain, fast or irregular heartbeat, dizziness, swelling of the ankles, feet, or hands Hormone gland problems--headache, sensitivity to light, unusual weakness or fatigue, dizziness, fast or irregular heartbeat, increased sensitivity to cold or heat, excessive sweating, constipation, hair loss, increased thirst or amount of urine, tremors or shaking, irritability Infusion reactions--chest pain, shortness of breath or trouble breathing, feeling faint or lightheaded Kidney injury (glomerulonephritis)--decrease in the amount of urine, red or dark brown urine, foamy or bubbly urine, swelling of the ankles, hands, or feet Liver injury--right upper belly pain, loss of appetite, nausea, light-colored stool, dark yellow or brown urine, yellowing skin or eyes, unusual weakness or fatigue Pain, tingling, or numbness in the hands or feet, muscle weakness, change in vision, confusion or trouble speaking, loss of balance or coordination, trouble walking, seizures Rash, fever, and swollen lymph nodes Redness, blistering, peeling, or loosening of the skin, including inside the mouth Sudden or severe stomach pain, bloody diarrhea, fever, nausea,  vomiting Side effects that usually do not require medical attention (report these to your care team if they continue or are bothersome): Bone, joint, or muscle pain Diarrhea Fatigue Loss of appetite Nausea Skin rash This list may not describe all possible side effects. Call your doctor for medical advice about side effects. You may report side effects to FDA at 1-800-FDA-1088. Where should I keep my medication? This medication is given in a hospital or clinic. It will not be stored at home. NOTE: This sheet is a summary. It may not cover all possible information. If you have questions about this medicine, talk to your doctor, pharmacist, or health care provider.  2024 Elsevier/Gold Standard (2022-03-09 00:00:00)       To help prevent nausea and vomiting after your treatment, we encourage you to take your nausea medication as directed.  BELOW ARE SYMPTOMS THAT SHOULD BE REPORTED IMMEDIATELY: *FEVER GREATER THAN 100.4 F (38 C) OR HIGHER *CHILLS OR SWEATING *NAUSEA AND VOMITING THAT IS NOT CONTROLLED WITH YOUR NAUSEA MEDICATION *UNUSUAL SHORTNESS OF BREATH *UNUSUAL BRUISING OR BLEEDING *URINARY PROBLEMS (pain or burning when urinating, or frequent urination) *BOWEL PROBLEMS (unusual diarrhea, constipation, pain near the anus) TENDERNESS IN MOUTH AND THROAT WITH OR WITHOUT PRESENCE OF ULCERS (sore throat, sores in mouth, or a toothache) UNUSUAL RASH, SWELLING OR PAIN  UNUSUAL VAGINAL DISCHARGE OR ITCHING   Items with * indicate a potential emergency and should be followed up as soon as possible  or go to the Emergency Department if any problems should occur.  Please show the CHEMOTHERAPY ALERT CARD or IMMUNOTHERAPY ALERT CARD at check-in to the Emergency Department and triage nurse.  Should you have questions after your visit or need to cancel or reschedule your appointment, please contact [facility] [phone]  and follow the prompts.  Office hours are 8:00  a.m. to 4:30 p.m. Monday - Friday. Please note that voicemails left after 4:00 p.m. may not be returned until the following business day.  We are closed weekends and major holidays. You have access to a nurse at all times for urgent questions. Please call the main number to the clinic [phone] and follow the prompts.  For any non-urgent questions, you may also contact your provider using MyChart. We now offer e-Visits for anyone 63 and older to request care online for non-urgent symptoms. For details visit [email or web address].   Also download the MyChart app! Go to the app store, search "MyChart", open the app, select [place], and log in with your MyChart username and password.

## 2023-04-29 LAB — T4: T4, Total: 5.7 ug/dL (ref 4.5–12.0)

## 2023-05-17 ENCOUNTER — Other Ambulatory Visit: Payer: Self-pay | Admitting: Hematology

## 2023-05-17 ENCOUNTER — Ambulatory Visit (HOSPITAL_COMMUNITY): Payer: Medicare Other

## 2023-05-18 ENCOUNTER — Encounter: Payer: Self-pay | Admitting: Hematology

## 2023-05-18 ENCOUNTER — Other Ambulatory Visit: Payer: Self-pay

## 2023-05-21 ENCOUNTER — Ambulatory Visit (HOSPITAL_COMMUNITY)
Admission: RE | Admit: 2023-05-21 | Discharge: 2023-05-21 | Disposition: A | Discharge status: Home / Self Care | Payer: Medicare Other | Source: Ambulatory Visit | Attending: Hematology | Admitting: Hematology

## 2023-05-21 DIAGNOSIS — C182 Malignant neoplasm of ascending colon: Secondary | ICD-10-CM | POA: Insufficient documentation

## 2023-05-21 DIAGNOSIS — K5730 Diverticulosis of large intestine without perforation or abscess without bleeding: Secondary | ICD-10-CM | POA: Diagnosis not present

## 2023-05-21 DIAGNOSIS — C642 Malignant neoplasm of left kidney, except renal pelvis: Secondary | ICD-10-CM | POA: Insufficient documentation

## 2023-05-21 DIAGNOSIS — C19 Malignant neoplasm of rectosigmoid junction: Secondary | ICD-10-CM | POA: Diagnosis not present

## 2023-05-25 ENCOUNTER — Inpatient Hospital Stay (HOSPITAL_BASED_OUTPATIENT_CLINIC_OR_DEPARTMENT_OTHER): Payer: Medicare Other | Admitting: Hematology

## 2023-05-25 ENCOUNTER — Inpatient Hospital Stay: Payer: Medicare Other

## 2023-05-25 ENCOUNTER — Inpatient Hospital Stay: Payer: Medicare Other | Attending: Hematology

## 2023-05-25 VITALS — BP 164/75 | HR 56 | Temp 97.5°F | Resp 18

## 2023-05-25 DIAGNOSIS — C182 Malignant neoplasm of ascending colon: Secondary | ICD-10-CM

## 2023-05-25 DIAGNOSIS — C642 Malignant neoplasm of left kidney, except renal pelvis: Secondary | ICD-10-CM

## 2023-05-25 DIAGNOSIS — Z79620 Long term (current) use of immunosuppressive biologic: Secondary | ICD-10-CM | POA: Insufficient documentation

## 2023-05-25 DIAGNOSIS — Z170 Estrogen receptor positive status [ER+]: Secondary | ICD-10-CM | POA: Diagnosis not present

## 2023-05-25 DIAGNOSIS — Z5112 Encounter for antineoplastic immunotherapy: Secondary | ICD-10-CM | POA: Diagnosis not present

## 2023-05-25 DIAGNOSIS — C50911 Malignant neoplasm of unspecified site of right female breast: Secondary | ICD-10-CM | POA: Diagnosis not present

## 2023-05-25 DIAGNOSIS — Z79811 Long term (current) use of aromatase inhibitors: Secondary | ICD-10-CM | POA: Insufficient documentation

## 2023-05-25 DIAGNOSIS — C773 Secondary and unspecified malignant neoplasm of axilla and upper limb lymph nodes: Secondary | ICD-10-CM | POA: Insufficient documentation

## 2023-05-25 DIAGNOSIS — Z87891 Personal history of nicotine dependence: Secondary | ICD-10-CM | POA: Diagnosis not present

## 2023-05-25 DIAGNOSIS — R911 Solitary pulmonary nodule: Secondary | ICD-10-CM | POA: Insufficient documentation

## 2023-05-25 DIAGNOSIS — C652 Malignant neoplasm of left renal pelvis: Secondary | ICD-10-CM | POA: Diagnosis not present

## 2023-05-25 DIAGNOSIS — E039 Hypothyroidism, unspecified: Secondary | ICD-10-CM | POA: Diagnosis not present

## 2023-05-25 LAB — COMPREHENSIVE METABOLIC PANEL
ALT: 16 U/L (ref 0–44)
AST: 19 U/L (ref 15–41)
Albumin: 3.8 g/dL (ref 3.5–5.0)
Alkaline Phosphatase: 86 U/L (ref 38–126)
Anion gap: 9 (ref 5–15)
BUN: 36 mg/dL — ABNORMAL HIGH (ref 8–23)
CO2: 22 mmol/L (ref 22–32)
Calcium: 9.7 mg/dL (ref 8.9–10.3)
Chloride: 107 mmol/L (ref 98–111)
Creatinine, Ser: 2.43 mg/dL — ABNORMAL HIGH (ref 0.44–1.00)
GFR, Estimated: 19 mL/min — ABNORMAL LOW (ref >60)
Glucose, Bld: 113 mg/dL — ABNORMAL HIGH (ref 70–99)
Potassium: 4.4 mmol/L (ref 3.5–5.1)
Sodium: 138 mmol/L (ref 135–145)
Total Bilirubin: 0.7 mg/dL (ref 0.3–1.2)
Total Protein: 6.7 g/dL (ref 6.5–8.1)

## 2023-05-25 LAB — CBC WITH DIFFERENTIAL/PLATELET
Abs Immature Granulocytes: 0.02 10*3/uL (ref 0.00–0.07)
Basophils Absolute: 0.1 10*3/uL (ref 0.0–0.1)
Basophils Relative: 1 %
Eosinophils Absolute: 0 10*3/uL (ref 0.0–0.5)
Eosinophils Relative: 0 %
HCT: 40.7 % (ref 36.0–46.0)
Hemoglobin: 13 g/dL (ref 12.0–15.0)
Immature Granulocytes: 0 %
Lymphocytes Relative: 17 %
Lymphs Abs: 1.2 10*3/uL (ref 0.7–4.0)
MCH: 28.9 pg (ref 26.0–34.0)
MCHC: 31.9 g/dL (ref 30.0–36.0)
MCV: 90.4 fL (ref 80.0–100.0)
Monocytes Absolute: 0.4 10*3/uL (ref 0.1–1.0)
Monocytes Relative: 6 %
Neutro Abs: 5.2 10*3/uL (ref 1.7–7.7)
Neutrophils Relative %: 76 %
Platelets: 171 10*3/uL (ref 150–400)
RBC: 4.5 MIL/uL (ref 3.87–5.11)
RDW: 13.6 % (ref 11.5–15.5)
WBC: 6.9 10*3/uL (ref 4.0–10.5)
nRBC: 0 % (ref 0.0–0.2)

## 2023-05-25 LAB — TSH: TSH: 5.519 u[IU]/mL — ABNORMAL HIGH (ref 0.350–4.500)

## 2023-05-25 LAB — MAGNESIUM: Magnesium: 2 mg/dL (ref 1.7–2.4)

## 2023-05-25 MED ORDER — SODIUM CHLORIDE 0.9 % IV SOLN: Freq: Once | INTRAVENOUS | Status: AC

## 2023-05-25 MED ORDER — SODIUM CHLORIDE 0.9% FLUSH
10.0000 mL | Freq: Once | INTRAVENOUS | Status: AC
  Administered 2023-05-25: 10 mL via INTRAVENOUS

## 2023-05-25 MED ORDER — HEPARIN SOD (PORK) LOCK FLUSH 100 UNIT/ML IV SOLN
500.0000 [IU] | Freq: Once | INTRAVENOUS | Status: AC | PRN
  Administered 2023-05-25: 500 [IU]

## 2023-05-25 MED ORDER — SODIUM CHLORIDE 0.9 % IV SOLN
480.0000 mg | Freq: Once | INTRAVENOUS | Status: AC
  Administered 2023-05-25: 480 mg via INTRAVENOUS
  Filled 2023-05-25: qty 48

## 2023-05-25 MED ORDER — SODIUM CHLORIDE 0.9% FLUSH
10.0000 mL | INTRAVENOUS | Status: DC | PRN
  Administered 2023-05-25: 10 mL

## 2023-05-25 NOTE — Progress Notes (Signed)
Patient has been examined by Dr. Ellin Saba. Vital signs and labs have been reviewed by MD - ANC, Creatinine (2.43), LFTs, hemoglobin, and platelets are within treatment parameters per M.D. - pt may proceed with treatment.  Primary RN and pharmacy notified.

## 2023-05-25 NOTE — Progress Notes (Signed)
Patient presents today for chemotherapy infusion. Patient is in satisfactory condition with no new complaints voiced.  Vital signs are stable.  Labs reviewed by Dr. Katragadda during the office visit and all labs are within treatment parameters.  We will proceed with treatment per MD orders.   Patient tolerated treatment well with no complaints voiced.  Patient left ambulatory in stable condition.  Vital signs stable at discharge.  Follow up as scheduled.       

## 2023-05-25 NOTE — Patient Instructions (Signed)
[facility] [place]  Discharge Instructions: Thank you for choosing [facility] to provide your oncology and hematology care.  If you have a lab appointment with the Cancer Center - please note that after April 8th, 2024, all labs will be drawn in the cancer center.  You do not have to check in or register with the main entrance as you have in the past but will complete your check-in in the cancer center.  Wear comfortable clothing and clothing appropriate for easy access to any Portacath or PICC line.   We strive to give you quality time with your provider. You may need to reschedule your appointment if you arrive late (15 or more minutes).  Arriving late affects you and other patients whose appointments are after yours.  Also, if you miss three or more appointments without notifying the office, you may be dismissed from the clinic at the provider's discretion.      For prescription refill requests, have your pharmacy contact our office and allow 72 hours for refills to be completed.    Today you received the following chemotherapy and/or immunotherapy agents Opdivo.  Nivolumab Injection What is this medication? NIVOLUMAB (nye VOL ue mab) treats some types of cancer. It works by helping your immune system slow or stop the spread of cancer cells. It is a monoclonal antibody. This medicine may be used for other purposes; ask your health care provider or pharmacist if you have questions. COMMON BRAND NAME(S): Opdivo What should I tell my care team before I take this medication? They need to know if you have any of these conditions: Allogeneic stem cell transplant (uses someone else's stem cells) Autoimmune diseases, such as Crohn disease, ulcerative colitis, lupus History of chest radiation Nervous system problems, such as Guillain-Barre syndrome or myasthenia gravis Organ transplant An unusual or allergic reaction to nivolumab, other medications, foods, dyes, or  preservatives Pregnant or trying to get pregnant Breast-feeding How should I use this medication? This medication is infused into a vein. It is given in a hospital or clinic setting. A special MedGuide will be given to you before each treatment. Be sure to read this information carefully each time. Talk to your care team about the use of this medication in children. While it may be prescribed for children as young as 12 years for selected conditions, precautions do apply. Overdosage: If you think you have taken too much of this medicine contact a poison control center or emergency room at once. NOTE: This medicine is only for you. Do not share this medicine with others. What if I miss a dose? Keep appointments for follow-up doses. It is important not to miss your dose. Call your care team if you are unable to keep an appointment. What may interact with this medication? Interactions have not been studied. This list may not describe all possible interactions. Give your health care provider a list of all the medicines, herbs, non-prescription drugs, or dietary supplements you use. Also tell them if you smoke, drink alcohol, or use illegal drugs. Some items may interact with your medicine. What should I watch for while using this medication? Your condition will be monitored carefully while you are receiving this medication. You may need blood work while taking this medication. This medication may cause serious skin reactions. They can happen weeks to months after starting the medication. Contact your care team right away if you notice fevers or flu-like symptoms with a rash. The rash may be red   or purple and then turn into blisters or peeling of the skin. You may also notice a red rash with swelling of the face, lips, or lymph nodes in your neck or under your arms. Tell your care team right away if you have any change in your eyesight. Talk to your care team if you are pregnant or think you might be  pregnant. A negative pregnancy test is required before starting this medication. A reliable form of contraception is recommended while taking this medication and for 5 months after the last dose. Talk to your care team about effective forms of contraception. Do not breast-feed while taking this medication and for 5 months after the last dose. What side effects may I notice from receiving this medication? Side effects that you should report to your care team as soon as possible: Allergic reactions--skin rash, itching, hives, swelling of the face, lips, tongue, or throat Dry cough, shortness of breath or trouble breathing Eye pain, redness, irritation, or discharge with blurry or decreased vision Heart muscle inflammation--unusual weakness or fatigue, shortness of breath, chest pain, fast or irregular heartbeat, dizziness, swelling of the ankles, feet, or hands Hormone gland problems--headache, sensitivity to light, unusual weakness or fatigue, dizziness, fast or irregular heartbeat, increased sensitivity to cold or heat, excessive sweating, constipation, hair loss, increased thirst or amount of urine, tremors or shaking, irritability Infusion reactions--chest pain, shortness of breath or trouble breathing, feeling faint or lightheaded Kidney injury (glomerulonephritis)--decrease in the amount of urine, red or dark Jessica Taylor urine, foamy or bubbly urine, swelling of the ankles, hands, or feet Liver injury--right upper belly pain, loss of appetite, nausea, light-colored stool, dark yellow or Jessica Taylor urine, yellowing skin or eyes, unusual weakness or fatigue Pain, tingling, or numbness in the hands or feet, muscle weakness, change in vision, confusion or trouble speaking, loss of balance or coordination, trouble walking, seizures Rash, fever, and swollen lymph nodes Redness, blistering, peeling, or loosening of the skin, including inside the mouth Sudden or severe stomach pain, bloody diarrhea, fever, nausea,  vomiting Side effects that usually do not require medical attention (report these to your care team if they continue or are bothersome): Bone, joint, or muscle pain Diarrhea Fatigue Loss of appetite Nausea Skin rash This list may not describe all possible side effects. Call your doctor for medical advice about side effects. You may report side effects to FDA at 1-800-FDA-1088. Where should I keep my medication? This medication is given in a hospital or clinic. It will not be stored at home. NOTE: This sheet is a summary. It may not cover all possible information. If you have questions about this medicine, talk to your doctor, pharmacist, or health care provider.  2024 Elsevier/Gold Standard (2022-03-09 00:00:00)       To help prevent nausea and vomiting after your treatment, we encourage you to take your nausea medication as directed.  BELOW ARE SYMPTOMS THAT SHOULD BE REPORTED IMMEDIATELY: *FEVER GREATER THAN 100.4 F (38 C) OR HIGHER *CHILLS OR SWEATING *NAUSEA AND VOMITING THAT IS NOT CONTROLLED WITH YOUR NAUSEA MEDICATION *UNUSUAL SHORTNESS OF BREATH *UNUSUAL BRUISING OR BLEEDING *URINARY PROBLEMS (pain or burning when urinating, or frequent urination) *BOWEL PROBLEMS (unusual diarrhea, constipation, pain near the anus) TENDERNESS IN MOUTH AND THROAT WITH OR WITHOUT PRESENCE OF ULCERS (sore throat, sores in mouth, or a toothache) UNUSUAL RASH, SWELLING OR PAIN  UNUSUAL VAGINAL DISCHARGE OR ITCHING   Items with * indicate a potential emergency and should be followed up as soon as possible   or go to the Emergency Department if any problems should occur.  Please show the CHEMOTHERAPY ALERT CARD or IMMUNOTHERAPY ALERT CARD at check-in to the Emergency Department and triage nurse.  Should you have questions after your visit or need to cancel or reschedule your appointment, please contact [facility] [phone]  and follow the prompts.  Office hours are 8:00  a.m. to 4:30 p.m. Monday - Friday. Please note that voicemails left after 4:00 p.m. may not be returned until the following business day.  We are closed weekends and major holidays. You have access to a nurse at all times for urgent questions. Please call the main number to the clinic [phone] and follow the prompts.  For any non-urgent questions, you may also contact your provider using MyChart. We now offer e-Visits for anyone 18 and older to request care online for non-urgent symptoms. For details visit [email or web address].   Also download the MyChart app! Go to the app store, search "MyChart", open the app, select [facility], and log in with your MyChart username and password.   

## 2023-05-25 NOTE — Patient Instructions (Signed)
[facility] Discharge Instructions   You were seen and examined today by Dr. Ellin Saba.  He reviewed the results of your lab work which are normal/stable.   He reviewed the results of your CT scan which was mostly good. There is a spot in the left lower lobe of your lung that has only grown slightly from last year. Dr. Kirtland Bouchard is not concerned about this spot at this time. We will continue to monitor this on subsequent scans.   We will proceed with your treatment today.   Return as scheduled.    Thank you for choosing [facility] to provide your oncology and hematology care.  To afford each patient quality time with our provider, please arrive at least 15 minutes before your scheduled appointment time.   If you have a lab appointment with the Cancer Center please come in thru the Main Entrance and check in at the main information desk.  You need to re-schedule your appointment should you arrive 10 or more minutes late.  We strive to give you quality time with our providers, and arriving late affects you and other patients whose appointments are after yours.  Also, if you no show three or more times for appointments you may be dismissed from the clinic at the providers discretion.     Again, thank you for choosing [facility].  Our hope is that these requests will decrease the amount of time that you wait before being seen by our physicians.       _____________________________________________________________  Should you have questions after your visit to [facility], please contact our office at [phone] and follow the prompts.  Our office hours are 8:00 a.m. and 4:30 p.m. Monday - Friday.  Please note that voicemails left after 4:00 p.m. may not be returned until the following business day.  We are closed weekends and major holidays.  You do have access to a nurse 24-7, just call the main  number to the clinic [phone] and do not press any options, hold on the line and a nurse will answer the phone.    For prescription refill requests, have your pharmacy contact our office and allow 72 hours.    Due to Covid, you will need to wear a mask upon entering the hospital. If you do not have a mask, a mask will be given to you at the Main Entrance upon arrival. For doctor visits, patients may have 1 support person age 28 or older with them. For treatment visits, patients can not have anyone with them due to social distancing guidelines and our immunocompromised population.

## 2023-05-25 NOTE — Progress Notes (Signed)
Patients port flushed without difficulty.  Good blood return noted with no bruising or swelling noted at site.  Stable during access and blood draw.  Patient to remain accessed for treatment. 

## 2023-05-25 NOTE — Progress Notes (Signed)
[facility] [address], [place] [address]    Clinic Day:  05/25/2023  Referring physician: Tally Joe, MD  Patient Care Team: Tally Joe, MD as PCP - General (Family Medicine) Jake Bathe, MD as PCP - Cardiology (Cardiology) Karie Soda, MD as Consulting Physician (General Surgery) Jake Bathe, MD as Consulting Physician (Cardiology) Mansouraty, Netty Starring., MD as Consulting Physician (Gastroenterology) Mickie Bail, RN as Oncology Nurse Navigator Doreatha Massed, MD as Consulting Physician (Medical Oncology)   ASSESSMENT & PLAN:   Assessment: 1.  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  Right hemicolectomy on 10/11/2019, grade 3 adenocarcinoma, negative margins, positive LVSI, 2 tumor deposits, 0/15 lymph nodes positive, PT3PN1C. 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  MLH1 hyper methylation present. -PET scan in December 2020 showed lymph node in the right axillary region.  Biopsy consistent with metastatic colon cancer. -Last CEA was 10.7 on 11/22/2019. -Right axillary lymph node excision on 04/02/2020 consistent with metastatic carcinoma from colon cancer. -Pembrolizumab started on 05/14/2020. -PET scan on 08/05/2020 shows complete resolution of lymphadenopathy.  No evidence of new areas of uptake.  Mild vague residual hypermetabolism in the right axilla without soft tissue mass. -PET scan on 12/23/2020 with no evidence of recurrence. Rande Lawman held since we started opdivo on 08/27/2022   2.  Stage I (PT1CPN0) right Breast, Grade 2 Invasive Lobular Carcinoma: -MRI of the breast on 12/25/2019 showed 1 cm mass behind the right areola with a suspicious mass in the left breast. -Right breast lumpectomy on 04/02/2020 shows invasive lobular carcinoma, grade 2, 1.2 cm.  Resection margins are negative.  Negative LVSI.  2 sentinel lymph nodes  were negative for carcinoma.  ER/PR 100% positive, HER-2 negative, Ki-67 10%.   3.  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 - Based on her renal function, she is not a candidate for cisplatin based chemotherapy.  Nivolumab was recommended for 1 year. - PET scan (08/06/2022): No evidence of metastatic disease. - Opdivo for 1 year started on 08/27/2022    Plan: 1.  Stage IV colon cancer to the right axillary lymph node: - CT CAP on 05/21/2023: No evidence of recurrence.  Left lung nodule slightly increased in size. - Last CEA was 11.4.  Will check another CEA level.   2.  Right breast invasive lobular carcinoma, grade 2: - Continue anastrozole.  Last mammogram in May 2023 was normal. - Would recommend another mammogram.   3.  Left lung pulmonary nodule: - We reviewed CT CAP from 05/13/2023: Left lobe pulmonary nodule measures 7 mm, 4 mm in July 2023. - As it is subcentimeter, I do not recommend PET scan.  Will monitor on subsequent scan in 6 months.   4.  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 - Reviewed labs today: Normal LFTs.  Creatinine stable at 2.4.  CBC normal. - Proceed with opdivo every 4 weeks.  RTC 3 months for follow-up.   5.  Hypothyroidism: - TSH is 5.5 today.  Continue Synthroid 50  mcg daily.    No orders of the defined types were placed in this encounter.     I,Katie Daubenspeck,acting as a Neurosurgeon for Doreatha Massed, MD.,have documented all relevant documentation on the behalf of  Doreatha Massed, MD,as directed by  Doreatha Massed, MD while in the presence of Doreatha Massed, MD.   I, Doreatha Massed MD, have reviewed the above documentation for accuracy and completeness, and I agree with the above.   Doreatha Massed, MD   7/2/20246:48 PM  CHIEF COMPLAINT:   Diagnosis: stage I right breast cancer and colon cancer and left kidney urothelial carcinoma    Cancer Staging  Cancer of ascending colon s/p robotic proximal colectomy 10/11/2019 Staging form: Colon and Rectum, AJCC 8th Edition - Clinical stage from 10/23/2019: Stage IVA (cT3, cN1c, cM1a) - Signed by Doreatha Massed, MD on 12/14/2019  Malignant neoplasm of right female breast [facility]) Staging form: Breast, AJCC 8th Edition - Clinical stage from 04/23/2020: Stage IA (cT1c, cN0(sn), cM0, G2, ER+, PR+, HER2-) - Signed by Doreatha Massed, MD on 04/23/2020  Urothelial carcinoma of kidney, left [facility]) Staging form: Kidney, AJCC 8th Edition - Clinical stage from 08/18/2022: Stage III (cT3, cN0, cM0) - Unsigned    Prior Therapy: 1. Robotic proximal colectomy on 10/11/2019. 2. Right breast lumpectomy and SLNB on 04/02/2020 3.  Left radical nephro ureterectomy, lymphadenectomy, bladder cuff excision on 07/13/2022  Current Therapy:  Opdivo every 4 weeks    HISTORY OF PRESENT ILLNESS:   Oncology History  Cancer of ascending colon s/p robotic proximal colectomy 10/11/2019  10/11/2019 Initial Diagnosis   Cancer of ascending colon s/p robotic proximal colectomy 10/11/2019   10/23/2019 Cancer Staging   Staging form: Colon and Rectum, AJCC 8th Edition - Clinical stage from 10/23/2019: Stage IVA (cT3, cN1c, cM1a) - Signed by Doreatha Massed, MD on 12/14/2019   05/14/2020 - 06/26/2022 Chemotherapy   Patient is on Treatment Plan : COLORECTAL Pembrolizumab q21d     05/14/2020 - 06/26/2022 Chemotherapy   Patient is on Treatment Plan : COLORECTAL Pembrolizumab (200) q21d     08/27/2022 -   Chemotherapy   Patient is on Treatment Plan : COLORECTAL Nivolumab (480) q28d     Malignant neoplasm of right female breast (HCC)  04/23/2020 Initial Diagnosis   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 (HCC)   04/23/2020 Cancer Staging   Staging form: Breast, AJCC 8th Edition - Clinical stage from 04/23/2020: Stage IA (cT1c, cN0(sn), cM0, G2, ER+, PR+, HER2-) - Signed by Doreatha Massed, MD on 04/23/2020   Urothelial carcinoma of kidney, left (HCC)  08/18/2022 Initial Diagnosis   Urothelial carcinoma of kidney, left (HCC)   08/27/2022 -  Chemotherapy   Patient is on Treatment Plan : COLORECTAL Nivolumab (480) q28d        INTERVAL HISTORY:   Jessica Taylor is a 82 y.o. female presenting to clinic today for follow up of stage I right breast cancer and colon cancer and left kidney urothelial carcinoma. She was last seen by me on 03/30/23.  Since her last visit, she underwent restaging CT C/A/P on 05/21/23 showing: enlarging left pulmonary nodule; no metastatic adenopathy or evidence of malignancy in abdomen or pelvis.  Today, she states that she is doing well overall. Her appetite level is at 75%. Her energy level is at 40%.  PAST MEDICAL HISTORY:   Past Medical History: Past Medical History:  Diagnosis Date   Abdominal aortic aneurysm (HCC)    Acid reflux    Anemia  Arthritis    knees , R shoulder - tx /w injection - 11/2014   Basal cell carcinoma (BCC) of dorsum of nose 2010   Resolved   Cancer (HCC)    basal cell on nose   Colon cancer (HCC)    Coronary artery disease    Hypercholesteremia    Hypertension    Hypothyroidism    Lt Acute pyelonephritis 09/11/2018   MI, old 2000   Peripheral arterial disease (HCC)    hhigh-grade ostial bilateral calcified iliac stenosis with claudication   Tobacco abuse    Vertigo    when lays on left side.    Surgical History: Past Surgical History:  Procedure Laterality Date   BREAST LUMPECTOMY WITH RADIOACTIVE SEED AND  SENTINEL LYMPH NODE BIOPSY Bilateral 04/02/2020   Procedure: BILATERAL BREAST LUMPECTOMY WITH RADIOACTIVE SEED AND RIGHT SENTINEL LYMPH NODE BIOPSY AND RIGHT TARGETED AXILLARY LYMPH NODE BIOPSY;  Surgeon: Harriette Bouillon, MD;  Location: [facility];  Service: General;  Laterality: Bilateral;   CARDIAC CATHETERIZATION  5 stents   CARDIAC CATHETERIZATION N/A 01/20/2016   Procedure: Left Heart Cath and Coronary Angiography;  Surgeon: Peter M Swaziland, MD;  Location: [facility] INVASIVE CV LAB;  Service: Cardiovascular;  Laterality: N/A;   CATARACT EXTRACTION W/PHACO Left 05/24/2014   Procedure: CATARACT EXTRACTION PHACO AND INTRAOCULAR LENS PLACEMENT (IOC);  Surgeon: Gemma Payor, MD;  Location: [facility];  Service: Ophthalmology;  Laterality: Left;  CDE:  9.30   CATARACT EXTRACTION W/PHACO Right 06/18/2014   Procedure: CATARACT EXTRACTION PHACO AND INTRAOCULAR LENS PLACEMENT RIGHT EYE CDE=10.84;  Surgeon: Gemma Payor, MD;  Location: [facility];  Service: Ophthalmology;  Laterality: Right;   CORONARY ARTERY BYPASS GRAFT N/A 01/24/2016   Procedure: CORONARY ARTERY BYPASS GRAFTING (CABG);  Surgeon: Alleen Borne, MD;  Location: [facility] OR;  Service: Open Heart Surgery;  Laterality: N/A;   CORONARY STENT PLACEMENT     CORONARY STENT PLACEMENT  03/06/15   CFX DES   CYSTOSCOPY/URETEROSCOPY/HOLMIUM LASER/STENT PLACEMENT Left 09/12/2018   Procedure: CYSTOSCOPY/URETEROSCOPY/STENT PLACEMENT;  Surgeon: Rene Paci, MD;  Location: [facility];  Service: Urology;  Laterality: Left;   EYE SURGERY     LEFT HEART CATH AND CORS/GRAFTS ANGIOGRAPHY N/A 09/12/2019   Procedure: LEFT HEART CATH AND CORS/GRAFTS ANGIOGRAPHY;  Surgeon: Lennette Bihari, MD;  Location: [facility] INVASIVE CV LAB;  Service: Cardiovascular;  Laterality: N/A;   LEFT HEART CATHETERIZATION WITH CORONARY ANGIOGRAM N/A 03/06/2015   Procedure: LEFT HEART CATHETERIZATION WITH CORONARY ANGIOGRAM;  Surgeon: Runell Gess, MD;  Location: [facility] CATH LAB;  Service:  Cardiovascular;  Laterality: N/A;   PARTIAL KNEE ARTHROPLASTY Right 02/04/2015   Procedure: UNICOMPARTMENTAL KNEE;  Surgeon: Jodi Geralds, MD;  Location: [facility] OR;  Service: Orthopedics;  Laterality: Right;   PERIPHERAL VASCULAR CATHETERIZATION Bilateral 05/13/2015   Procedure: Lower Extremity Angiography;  Surgeon: Runell Gess, MD;  Location: [facility] INVASIVE CV LAB;  Service: Cardiovascular;  Laterality: Bilateral;   PERIPHERAL VASCULAR CATHETERIZATION N/A 05/13/2015   Procedure: Abdominal Aortogram;  Surgeon: Runell Gess, MD;  Location: [facility] INVASIVE CV LAB;  Service: Cardiovascular;  Laterality: N/A;   PERIPHERAL VASCULAR CATHETERIZATION Bilateral 06/27/2015   Procedure: Peripheral Vascular Intervention;  Surgeon: Runell Gess, MD;  Location: [facility] INVASIVE CV LAB;  Service: Cardiovascular;  Laterality: Bilateral;  ILIACS   PERIPHERAL VASCULAR CATHETERIZATION Bilateral 06/27/2015   Procedure: Peripheral Vascular Atherectomy;  Surgeon: Runell Gess, MD;  Location: [facility] INVASIVE CV LAB;  Service: Cardiovascular;  Laterality: Bilateral;   PORTACATH  PLACEMENT Left 05/24/2020   Procedure: INSERTION PORT-A-CATH;  Surgeon: Franky Macho, MD;  Location: [facility];  Service: General;  Laterality: Left;   TEE WITHOUT CARDIOVERSION N/A 01/24/2016   Procedure: TRANSESOPHAGEAL ECHOCARDIOGRAM (TEE);  Surgeon: Alleen Borne, MD;  Location: [facility] OR;  Service: Open Heart Surgery;  Laterality: N/A;   TONSILLECTOMY     age 72   TUBAL LIGATION      Social History: Social History   Socioeconomic History   Marital status: Married    Spouse name: Tommy   Number of children: 4   Years of education: Not on file   Highest education level: Not on file  Occupational History   Occupation: retired    Comment: worked as Tree surgeon for [employer]  Tobacco Use   Smoking status: Former    Packs/day: 1.50    Years: 42.00    Additional pack years: 0.00    Total pack years: 63.00    Types: Cigarettes    Quit  date: 05/19/1999    Years since quitting: 24.0   Smokeless tobacco: Never  Vaping Use   Vaping Use: Never used  Substance and Sexual Activity   Alcohol use: No   Drug use: No   Sexual activity: Yes    Birth control/protection: Surgical  Other Topics Concern   Not on file  Social History Narrative   Not on file   Social Determinants of Health   Financial Resource Strain: Medium Risk (02/18/2021)   Overall Financial Resource Strain (CARDIA)    Difficulty of Paying Living Expenses: Somewhat hard  Food Insecurity: No Food Insecurity (02/18/2021)   Hunger Vital Sign    Worried About Running Out of Food in the Last Year: Never true    Ran Out of Food in the Last Year: Never true  Transportation Needs: No Transportation Needs (02/18/2021)   PRAPARE - Administrator, Civil Service (Medical): No    Lack of Transportation (Non-Medical): No  Physical Activity: Inactive (02/18/2021)   Exercise Vital Sign    Days of Exercise per Week: 0 days    Minutes of Exercise per Session: 0 min  Stress: No Stress Concern Present (02/18/2021)   [employer] of Occupational Health - Occupational Stress Questionnaire    Feeling of Stress : Not at all  Social Connections: Moderately Isolated (02/18/2021)   Social Connection and Isolation Panel [NHANES]    Frequency of Communication with Friends and Family: More than three times a week    Frequency of Social Gatherings with Friends and Family: Three times a week    Attends Religious Services: Never    Active Member of Clubs or Organizations: No    Attends Banker Meetings: Never    Marital Status: Married  Catering manager Violence: Not At Risk (02/18/2021)   Humiliation, Afraid, Rape, and Kick questionnaire    Fear of Current or Ex-Partner: No    Emotionally Abused: No    Physically Abused: No    Sexually Abused: No    Family History: Family History  Problem Relation Age of Onset   Ovarian cancer Mother 29   Cancer  Father 63       unsure of which kind, "it was in his glands"   Hypertension Maternal Grandmother    Stroke Maternal Grandfather    Hypertension Son    Brain cancer Sister    Hypertension Daughter    Heart attack Neg Hx    Colon cancer Neg Hx  Esophageal cancer Neg Hx    Inflammatory bowel disease Neg Hx    Liver disease Neg Hx    Pancreatic cancer Neg Hx    Rectal cancer Neg Hx    Stomach cancer Neg Hx     Current Medications:  Current Outpatient Medications:    amLODipine (NORVASC) 5 MG tablet, TAKE 1 TABLET BY MOUTH ONCE A DAY., Disp: 30 tablet, Rfl: 0   anastrozole (ARIMIDEX) 1 MG tablet, Take 1 tablet (1 mg total) by mouth daily., Disp: 30 tablet, Rfl: 3   aspirin EC 81 MG tablet, Take 81 mg by mouth every evening. , Disp: , Rfl:    atorvastatin (LIPITOR) 40 MG tablet, TAKE (1) TABLET BY MOUTH ONCE DAILY. KEEP FUTURE APPOINTMENTS FOR ANY FURTHER REFILLS., Disp: 90 tablet, Rfl: 3   betamethasone dipropionate 0.05 % cream, Apply topically 2 (two) times daily., Disp: 30 g, Rfl: 0   clopidogrel (PLAVIX) 75 MG tablet, TAKE (1) TABLET BY MOUTH ONCE DAILY. PLEASE KEEP UPCOMING APPOINTMENTS FOR FUTURE REFILLS., Disp: 30 tablet, Rfl: 0   esomeprazole (NEXIUM) 20 MG capsule, Take 20 mg by mouth daily. , Disp: , Rfl:    HYDROcodone-acetaminophen (NORCO/VICODIN) 5-325 MG tablet, Take 1 tablet by mouth daily., Disp: 15 tablet, Rfl: 0   hydrOXYzine (ATARAX) 25 MG tablet, TAKE (1) TABLET BY MOUTH AT BEDTIME AS NEEDED, Disp: 30 tablet, Rfl: 0   levothyroxine (SYNTHROID) 50 MCG tablet, Take 0.5 tablets (25 mcg total) by mouth daily., Disp: 30 tablet, Rfl: 5   metoprolol tartrate (LOPRESSOR) 25 MG tablet, TAKE 1 TABLET BY MOUTH TWICE DAILY., Disp: 160 tablet, Rfl: 3   nivolumab (OPDIVO) 100 MG/10ML SOLN chemo injection, as directed Intravenous once a month, Disp: , Rfl:    Vitamin D, Ergocalciferol, (DRISDOL) 1.25 MG (50000 UNIT) CAPS capsule, TAKE 1 CAPSULE BY MOUTH ONCE A WEEK., Disp: 4  capsule, Rfl: 0   nitroGLYCERIN (NITROSTAT) 0.4 MG SL tablet, DISSOLVE 1 TABLET SUBLINGUALLY AS NEEDED FOR CHEST PAIN, MAY REPEAT EVERY 5 MINUTES. AFTER 3 CALL 911. (Patient not taking: Reported on 05/25/2023), Disp: 25 tablet, Rfl: 4 No current facility-administered medications for this visit.  Facility-Administered Medications Ordered in Other Visits:    sodium chloride flush (NS) 0.9 % injection 10 mL, 10 mL, Intracatheter, PRN, Doreatha Massed, MD, 10 mL at 11/26/22 1631   sodium chloride flush (NS) 0.9 % injection 10 mL, 10 mL, Intracatheter, PRN, Doreatha Massed, MD, 10 mL at 03/02/23 1120   sodium chloride flush (NS) 0.9 % injection 10 mL, 10 mL, Intracatheter, PRN, Doreatha Massed, MD, 10 mL at 05/25/23 1436   Allergies: Allergies  Allergen Reactions   Crab [Shellfish Allergy] Nausea And Vomiting    Throws up violently   Ezetimibe Diarrhea   Keflex [Cephalexin]     Caused sores   Other Nausea And Vomiting    Shrimp   Shrimp Extract     Other Reaction(s): GI Intolerance    REVIEW OF SYSTEMS:   Review of Systems  Constitutional:  Negative for chills, fatigue and fever.  HENT:   Negative for lump/mass, mouth sores, nosebleeds, sore throat and trouble swallowing.   Eyes:  Negative for eye problems.  Respiratory:  Positive for shortness of breath. Negative for cough.   Cardiovascular:  Negative for chest pain, leg swelling and palpitations.  Gastrointestinal:  Negative for abdominal pain, constipation, diarrhea, nausea and vomiting.  Genitourinary:  Negative for bladder incontinence, difficulty urinating, dysuria, frequency, hematuria and nocturia.   Musculoskeletal:  Negative for  arthralgias, back pain, flank pain, myalgias and neck pain.  Skin:  Negative for itching and rash.  Neurological:  Positive for headaches. Negative for dizziness and numbness.  Hematological:  Does not bruise/bleed easily.  Psychiatric/Behavioral:  Negative for depression, sleep  disturbance and suicidal ideas. The patient is not nervous/anxious.   All other systems reviewed and are negative.    VITALS:   Weight 158 lb 3.2 oz (71.8 kg).  Wt Readings from Last 3 Encounters:  05/25/23 158 lb 3.2 oz (71.8 kg)  04/27/23 158 lb 6.4 oz (71.8 kg)  03/02/23 156 lb 3.2 oz (70.9 kg)    Body mass index is 28.02 kg/m.  Performance status (ECOG): 1 - Symptomatic but completely ambulatory  PHYSICAL EXAM:   Physical Exam Vitals and nursing note reviewed. Exam conducted with a chaperone present.  Constitutional:      Appearance: Normal appearance.  Cardiovascular:     Rate and Rhythm: Normal rate and regular rhythm.     Pulses: Normal pulses.     Heart sounds: Normal heart sounds.  Pulmonary:     Effort: Pulmonary effort is normal.     Breath sounds: Normal breath sounds.  Abdominal:     Palpations: Abdomen is soft. There is no hepatomegaly, splenomegaly or mass.     Tenderness: There is no abdominal tenderness.  Musculoskeletal:     Right lower leg: No edema.     Left lower leg: No edema.  Lymphadenopathy:     Cervical: No cervical adenopathy.     Right cervical: No superficial, deep or posterior cervical adenopathy.    Left cervical: No superficial, deep or posterior cervical adenopathy.     Upper Body:     Right upper body: No supraclavicular or axillary adenopathy.     Left upper body: No supraclavicular or axillary adenopathy.  Neurological:     General: No focal deficit present.     Mental Status: She is alert and oriented to person, place, and time.  Psychiatric:        Mood and Affect: Mood normal.        Behavior: Behavior normal.     LABS:      Latest Ref Rng & Units 05/25/2023   10:28 AM 04/27/2023   11:24 AM 03/30/2023   12:16 PM  CBC  WBC 4.0 - 10.5 K/uL 6.9  4.9  6.1   Hemoglobin 12.0 - 15.0 g/dL 16.1  09.6  04.5   Hematocrit 36.0 - 46.0 % 40.7  39.9  39.7   Platelets 150 - 400 K/uL 171  148  169       Latest Ref Rng & Units 05/25/2023    10:28 AM 04/27/2023   11:24 AM 03/30/2023   12:16 PM  CMP  Glucose 70 - 99 mg/dL 409  811  914   BUN 8 - 23 mg/dL 36  29  28   Creatinine 0.44 - 1.00 mg/dL 7.82  9.56  2.13   Sodium 135 - 145 mmol/L 138  136  139   Potassium 3.5 - 5.1 mmol/L 4.4  3.8  3.6   Chloride 98 - 111 mmol/L 107  106  106   CO2 22 - 32 mmol/L 22  22  22    Calcium 8.9 - 10.3 mg/dL 9.7  9.1  9.5   Total Protein 6.5 - 8.1 g/dL 6.7  6.7  6.9   Total Bilirubin 0.3 - 1.2 mg/dL 0.7  0.7  0.9   Alkaline Phos 38 -  126 U/L 86  89  82   AST 15 - 41 U/L 19  17  17    ALT 0 - 44 U/L 16  12  12       Lab Results  Component Value Date   CEA1 11.4 (H) 03/02/2023   CEA 39.5 (H) 08/09/2019   /  CEA  Date Value Ref Range Status  03/02/2023 11.4 (H) 0.0 - 4.7 ng/mL Final    Comment:    (NOTE)                             Nonsmokers          <3.9                             Smokers             <5.6 Roche [employer]ostics Electrochemiluminescence Immunoassay (ECLIA) Values obtained with different assay methods or kits cannot be used interchangeably.  Results cannot be interpreted as absolute evidence of the presence or absence of malignant disease. Performed At: [facility] [address] [place], [place] [id number] Jolene Schimke MD WU:[id number]   08/09/2019 39.5 (H) ng/mL Final    Comment:    Non-Smoker: <2.5 Smoker:     <5.0 . . This test was performed using the Siemens  chemiluminescent method. Values obtained from different assay methods cannot be used interchangeably. CEA levels, regardless of value, should not be interpreted as absolute evidence of the presence or absence of disease. .    No results found for: "PSA1" No results found for: "NFA213" No results found for: "CAN125"  No results found for: "TOTALPROTELP", "ALBUMINELP", "A1GS", "A2GS", "BETS", "BETA2SER", "GAMS", "MSPIKE", "SPEI" Lab Results  Component Value Date   TIBC 357 06/04/2022   TIBC 269 11/22/2019   TIBC 271 10/23/2019    FERRITIN 10 (L) 06/04/2022   FERRITIN 230 11/22/2019   FERRITIN 47 10/23/2019   IRONPCTSAT 9 (L) 06/04/2022   IRONPCTSAT 31 11/22/2019   IRONPCTSAT 11 10/23/2019   No results found for: "LDH"   STUDIES:   CT CHEST ABDOMEN PELVIS WO CONTRAST  Result Date: 05/24/2023 CLINICAL DATA:  Follow-up colorectal carcinoma. Additional history renal cancer and breast cancer. * Tracking Code: BO * EXAM: CT CHEST, ABDOMEN AND PELVIS WITHOUT CONTRAST TECHNIQUE: Multidetector CT imaging of the chest, abdomen and pelvis was performed following the standard protocol without IV contrast. RADIATION DOSE REDUCTION: This exam was performed according to the departmental dose-optimization program which includes automated exposure control, adjustment of the mA and/or kV according to patient size and/or use of iterative reconstruction technique. COMPARISON:  CT 02/08/2023, 05/28/2022, FDG PET scans 11/19/2022, 08/06/2022 FINDINGS: CT CHEST FINDINGS Cardiovascular: Port in the anterior chest wall with tip in distal SVC. Coronary artery calcification and aortic atherosclerotic calcification. Midline sternotomy. Post CABG Mediastinum/Nodes: No axillary or supraclavicular adenopathy. No mediastinal adenopathy. Lungs/Pleura: LEFT lobe pulmonary nodule measures 7 mm (image 63/3) compared to the 6 mm on most recent CT exam and 4 mm on chest CT 05/28/2022. In retrospect, faint metabolic activity associated nodule comparison PET-CT scans. No new pulmonary nodules. Musculoskeletal: No aggressive osseous lesion. CT ABDOMEN AND PELVIS FINDINGS Hepatobiliary: No focal hepatic lesion. No biliary ductal dilatation. Gallbladder is normal. Common bile duct is normal. Pancreas: Pancreas is normal. No ductal dilatation. No pancreatic inflammation. Spleen: Normal spleen Adrenals/urinary tract: Adrenal glands normal. Post LEFT nephrectomy. RIGHT kidney normal.  Ureter and bladder normal. Stomach/Bowel: Is post partial patient post RIGHT hemicolectomy.  LEFT colon normal. Multiple diverticula of the descending colon and sigmoid colon without acute inflammation. Vascular/Lymphatic: Abdominal aorta is normal caliber with atherosclerotic calcification. There is no retroperitoneal or periportal lymphadenopathy. No pelvic lymphadenopathy. Reproductive: Post hysterectomy. Adnexa unremarkable scratch the Uterus and adnexa unremarkable. Other: No free fluid. Musculoskeletal: No aggressive osseous lesion. IMPRESSION: Chest Impression: 1. Enlarging LEFT lobe pulmonary nodule is concerning for primary lung cancer versus metastatic nodule. Recommend repeat FDG PET scan or tissue sampling. 2. No metastatic adenopathy. Abdomen / Pelvis Impression: 1. No evidence of malignancy in the abdomen pelvis. 2. Post LEFT nephrectomy.  Post RIGHT hemicolectomy 3. Extensive atherosclerotic calcification of the aorta with stent graft. Electronically Signed   By: Genevive Bi M.D.   On: 05/24/2023 13:59

## 2023-05-26 ENCOUNTER — Other Ambulatory Visit: Payer: Self-pay

## 2023-05-26 LAB — T4: T4, Total: 5 ug/dL (ref 4.5–12.0)

## 2023-05-29 ENCOUNTER — Other Ambulatory Visit: Payer: Self-pay

## 2023-06-11 ENCOUNTER — Other Ambulatory Visit: Payer: Self-pay | Admitting: Hematology

## 2023-06-14 ENCOUNTER — Encounter: Payer: Self-pay | Admitting: Hematology

## 2023-06-21 ENCOUNTER — Other Ambulatory Visit: Payer: Self-pay | Admitting: Hematology

## 2023-06-24 ENCOUNTER — Inpatient Hospital Stay: Payer: Medicare Other | Attending: Hematology

## 2023-06-24 ENCOUNTER — Inpatient Hospital Stay: Payer: Medicare Other

## 2023-06-24 VITALS — BP 129/60 | HR 52 | Temp 96.3°F | Resp 18

## 2023-06-24 DIAGNOSIS — C182 Malignant neoplasm of ascending colon: Secondary | ICD-10-CM

## 2023-06-24 DIAGNOSIS — R911 Solitary pulmonary nodule: Secondary | ICD-10-CM | POA: Diagnosis not present

## 2023-06-24 DIAGNOSIS — Z79620 Long term (current) use of immunosuppressive biologic: Secondary | ICD-10-CM | POA: Diagnosis not present

## 2023-06-24 DIAGNOSIS — C50911 Malignant neoplasm of unspecified site of right female breast: Secondary | ICD-10-CM | POA: Diagnosis not present

## 2023-06-24 DIAGNOSIS — Z85038 Personal history of other malignant neoplasm of large intestine: Secondary | ICD-10-CM | POA: Insufficient documentation

## 2023-06-24 DIAGNOSIS — C642 Malignant neoplasm of left kidney, except renal pelvis: Secondary | ICD-10-CM | POA: Diagnosis not present

## 2023-06-24 DIAGNOSIS — Z5112 Encounter for antineoplastic immunotherapy: Secondary | ICD-10-CM | POA: Insufficient documentation

## 2023-06-24 DIAGNOSIS — Z79811 Long term (current) use of aromatase inhibitors: Secondary | ICD-10-CM | POA: Diagnosis not present

## 2023-06-24 LAB — CBC WITH DIFFERENTIAL/PLATELET
Abs Immature Granulocytes: 0.02 10*3/uL (ref 0.00–0.07)
Basophils Absolute: 0 10*3/uL (ref 0.0–0.1)
Basophils Relative: 1 %
Eosinophils Absolute: 0 10*3/uL (ref 0.0–0.5)
Eosinophils Relative: 1 %
HCT: 40.5 % (ref 36.0–46.0)
Hemoglobin: 12.7 g/dL (ref 12.0–15.0)
Immature Granulocytes: 1 %
Lymphocytes Relative: 24 %
Lymphs Abs: 1.1 10*3/uL (ref 0.7–4.0)
MCH: 28.4 pg (ref 26.0–34.0)
MCHC: 31.4 g/dL (ref 30.0–36.0)
MCV: 90.6 fL (ref 80.0–100.0)
Monocytes Absolute: 0.3 10*3/uL (ref 0.1–1.0)
Monocytes Relative: 7 %
Neutro Abs: 2.9 10*3/uL (ref 1.7–7.7)
Neutrophils Relative %: 66 %
Platelets: 157 10*3/uL (ref 150–400)
RBC: 4.47 MIL/uL (ref 3.87–5.11)
RDW: 13.7 % (ref 11.5–15.5)
WBC: 4.4 10*3/uL (ref 4.0–10.5)
nRBC: 0 % (ref 0.0–0.2)

## 2023-06-24 LAB — COMPREHENSIVE METABOLIC PANEL
ALT: 13 U/L (ref 0–44)
AST: 17 U/L (ref 15–41)
Albumin: 4.1 g/dL (ref 3.5–5.0)
Alkaline Phosphatase: 84 U/L (ref 38–126)
Anion gap: 9 (ref 5–15)
BUN: 36 mg/dL — ABNORMAL HIGH (ref 8–23)
CO2: 22 mmol/L (ref 22–32)
Calcium: 9.4 mg/dL (ref 8.9–10.3)
Chloride: 107 mmol/L (ref 98–111)
Creatinine, Ser: 2.57 mg/dL — ABNORMAL HIGH (ref 0.44–1.00)
GFR, Estimated: 18 mL/min — ABNORMAL LOW (ref >60)
Glucose, Bld: 115 mg/dL — ABNORMAL HIGH (ref 70–99)
Potassium: 3.9 mmol/L (ref 3.5–5.1)
Sodium: 138 mmol/L (ref 135–145)
Total Bilirubin: 0.8 mg/dL (ref 0.3–1.2)
Total Protein: 7 g/dL (ref 6.5–8.1)

## 2023-06-24 LAB — MAGNESIUM: Magnesium: 2.1 mg/dL (ref 1.7–2.4)

## 2023-06-24 LAB — TSH: TSH: 4.664 u[IU]/mL — ABNORMAL HIGH (ref 0.350–4.500)

## 2023-06-24 MED ORDER — SODIUM CHLORIDE 0.9% FLUSH
10.0000 mL | INTRAVENOUS | Status: DC | PRN
  Administered 2023-06-24: 10 mL

## 2023-06-24 MED ORDER — HEPARIN SOD (PORK) LOCK FLUSH 100 UNIT/ML IV SOLN
500.0000 [IU] | Freq: Once | INTRAVENOUS | Status: AC | PRN
  Administered 2023-06-24: 500 [IU]

## 2023-06-24 MED ORDER — SODIUM CHLORIDE 0.9 % IV SOLN
480.0000 mg | Freq: Once | INTRAVENOUS | Status: AC
  Administered 2023-06-24: 480 mg via INTRAVENOUS
  Filled 2023-06-24: qty 48

## 2023-06-24 MED ORDER — SODIUM CHLORIDE 0.9 % IV SOLN: Freq: Once | INTRAVENOUS | Status: AC

## 2023-06-24 NOTE — Patient Instructions (Signed)
[facility] [place]  Discharge Instructions: Thank you for choosing [facility] to provide your oncology and hematology care.  If you have a lab appointment with the Cancer Center - please note that after April 8th, 2024, all labs will be drawn in the cancer center.  You do not have to check in or register with the main entrance as you have in the past but will complete your check-in in the cancer center.  Wear comfortable clothing and clothing appropriate for easy access to any Portacath or PICC line.   We strive to give you quality time with your provider. You may need to reschedule your appointment if you arrive late (15 or more minutes).  Arriving late affects you and other patients whose appointments are after yours.  Also, if you miss three or more appointments without notifying the office, you may be dismissed from the clinic at the provider's discretion.      For prescription refill requests, have your pharmacy contact our office and allow 72 hours for refills to be completed.    Today you received the following chemotherapy and/or immunotherapy agents Opdivo   To help prevent nausea and vomiting after your treatment, we encourage you to take your nausea medication as directed.  Nivolumab Injection What is this medication? NIVOLUMAB (nye VOL ue mab) treats some types of cancer. It works by helping your immune system slow or stop the spread of cancer cells. It is a monoclonal antibody. This medicine may be used for other purposes; ask your health care provider or pharmacist if you have questions. COMMON BRAND NAME(S): Opdivo What should I tell my care team before I take this medication? They need to know if you have any of these conditions: Allogeneic stem cell transplant (uses someone else's stem cells) Autoimmune diseases, such as Crohn disease, ulcerative colitis, lupus History of chest radiation Nervous system problems, such as Guillain-Barre syndrome or  myasthenia gravis Organ transplant An unusual or allergic reaction to nivolumab, other medications, foods, dyes, or preservatives Pregnant or trying to get pregnant Breast-feeding How should I use this medication? This medication is infused into a vein. It is given in a hospital or clinic setting. A special MedGuide will be given to you before each treatment. Be sure to read this information carefully each time. Talk to your care team about the use of this medication in children. While it may be prescribed for children as young as 12 years for selected conditions, precautions do apply. Overdosage: If you think you have taken too much of this medicine contact a poison control center or emergency room at once. NOTE: This medicine is only for you. Do not share this medicine with others. What if I miss a dose? Keep appointments for follow-up doses. It is important not to miss your dose. Call your care team if you are unable to keep an appointment. What may interact with this medication? Interactions have not been studied. This list may not describe all possible interactions. Give your health care provider a list of all the medicines, herbs, non-prescription drugs, or dietary supplements you use. Also tell them if you smoke, drink alcohol, or use illegal drugs. Some items may interact with your medicine. What should I watch for while using this medication? Your condition will be monitored carefully while you are receiving this medication. You may need blood work while taking this medication. This medication may cause serious skin reactions. They can happen weeks to months after starting the medication.   Contact your care team right away if you notice fevers or flu-like symptoms with a rash. The rash may be red or purple and then turn into blisters or peeling of the skin. You may also notice a red rash with swelling of the face, lips, or lymph nodes in your neck or under your arms. Tell your care team  right away if you have any change in your eyesight. Talk to your care team if you are pregnant or think you might be pregnant. A negative pregnancy test is required before starting this medication. A reliable form of contraception is recommended while taking this medication and for 5 months after the last dose. Talk to your care team about effective forms of contraception. Do not breast-feed while taking this medication and for 5 months after the last dose. What side effects may I notice from receiving this medication? Side effects that you should report to your care team as soon as possible: Allergic reactions--skin rash, itching, hives, swelling of the face, lips, tongue, or throat Dry cough, shortness of breath or trouble breathing Eye pain, redness, irritation, or discharge with blurry or decreased vision Heart muscle inflammation--unusual weakness or fatigue, shortness of breath, chest pain, fast or irregular heartbeat, dizziness, swelling of the ankles, feet, or hands Hormone gland problems--headache, sensitivity to light, unusual weakness or fatigue, dizziness, fast or irregular heartbeat, increased sensitivity to cold or heat, excessive sweating, constipation, hair loss, increased thirst or amount of urine, tremors or shaking, irritability Infusion reactions--chest pain, shortness of breath or trouble breathing, feeling faint or lightheaded Kidney injury (glomerulonephritis)--decrease in the amount of urine, red or dark brown urine, foamy or bubbly urine, swelling of the ankles, hands, or feet Liver injury--right upper belly pain, loss of appetite, nausea, light-colored stool, dark yellow or brown urine, yellowing skin or eyes, unusual weakness or fatigue Pain, tingling, or numbness in the hands or feet, muscle weakness, change in vision, confusion or trouble speaking, loss of balance or coordination, trouble walking, seizures Rash, fever, and swollen lymph nodes Redness, blistering, peeling,  or loosening of the skin, including inside the mouth Sudden or severe stomach pain, bloody diarrhea, fever, nausea, vomiting Side effects that usually do not require medical attention (report these to your care team if they continue or are bothersome): Bone, joint, or muscle pain Diarrhea Fatigue Loss of appetite Nausea Skin rash This list may not describe all possible side effects. Call your doctor for medical advice about side effects. You may report side effects to FDA at 1-800-FDA-1088. Where should I keep my medication? This medication is given in a hospital or clinic. It will not be stored at home. NOTE: This sheet is a summary. It may not cover all possible information. If you have questions about this medicine, talk to your doctor, pharmacist, or health care provider.  2024 Elsevier/Gold Standard (2022-03-09 00:00:00)   BELOW ARE SYMPTOMS THAT SHOULD BE REPORTED IMMEDIATELY: *FEVER GREATER THAN 100.4 F (38 C) OR HIGHER *CHILLS OR SWEATING *NAUSEA AND VOMITING THAT IS NOT CONTROLLED WITH YOUR NAUSEA MEDICATION *UNUSUAL SHORTNESS OF BREATH *UNUSUAL BRUISING OR BLEEDING *URINARY PROBLEMS (pain or burning when urinating, or frequent urination) *BOWEL PROBLEMS (unusual diarrhea, constipation, pain near the anus) TENDERNESS IN MOUTH AND THROAT WITH OR WITHOUT PRESENCE OF ULCERS (sore throat, sores in mouth, or a toothache) UNUSUAL RASH, SWELLING OR PAIN  UNUSUAL VAGINAL DISCHARGE OR ITCHING   Items with * indicate a potential emergency and should be followed up as soon as possible or go to   the Emergency Department if any problems should occur.  Please show the CHEMOTHERAPY ALERT CARD or IMMUNOTHERAPY ALERT CARD at check-in to the Emergency Department and triage nurse.  Should you have questions after your visit or need to cancel or reschedule your appointment, please contact [facility] [phone]  and follow the prompts.  Office hours are 8:00 a.m. to 4:30  p.m. Monday - Friday. Please note that voicemails left after 4:00 p.m. may not be returned until the following business day.  We are closed weekends and major holidays. You have access to a nurse at all times for urgent questions. Please call the main number to the clinic [phone] and follow the prompts.  For any non-urgent questions, you may also contact your provider using MyChart. We now offer e-Visits for anyone 18 and older to request care online for non-urgent symptoms. For details visit [email or web address].   Also download the MyChart app! Go to the app store, search "MyChart", open the app, select , and log in with your MyChart username and password.   

## 2023-06-24 NOTE — Progress Notes (Signed)
Patient presents today for Opdivo infusion. Patient is in satisfactory condition with no new complaints voiced.  Vital signs are stable.  Labs reviewed and all labs are within treatment parameters.  We will proceed with treatment per MD orders.    Treatment given today per MD orders. Tolerated infusion without adverse affects. Vital signs stable. No complaints at this time. Discharged from clinic ambulatory in stable condition. Alert and oriented x 3. F/U with [facility] as scheduled.

## 2023-06-28 ENCOUNTER — Other Ambulatory Visit: Payer: Self-pay | Admitting: Hematology

## 2023-07-13 ENCOUNTER — Other Ambulatory Visit: Payer: Self-pay

## 2023-07-13 DIAGNOSIS — C182 Malignant neoplasm of ascending colon: Secondary | ICD-10-CM

## 2023-07-13 DIAGNOSIS — Z95828 Presence of other vascular implants and grafts: Secondary | ICD-10-CM

## 2023-07-13 DIAGNOSIS — E0590 Thyrotoxicosis, unspecified without thyrotoxic crisis or storm: Secondary | ICD-10-CM

## 2023-07-13 DIAGNOSIS — C642 Malignant neoplasm of left kidney, except renal pelvis: Secondary | ICD-10-CM

## 2023-07-13 DIAGNOSIS — C50911 Malignant neoplasm of unspecified site of right female breast: Secondary | ICD-10-CM

## 2023-07-15 ENCOUNTER — Other Ambulatory Visit: Payer: Self-pay | Admitting: Hematology

## 2023-07-15 ENCOUNTER — Other Ambulatory Visit: Payer: Self-pay | Admitting: Cardiology

## 2023-07-22 ENCOUNTER — Inpatient Hospital Stay: Payer: Medicare Other

## 2023-07-22 VITALS — BP 145/56 | HR 63 | Temp 97.0°F | Resp 18 | Wt 158.6 lb

## 2023-07-22 DIAGNOSIS — R911 Solitary pulmonary nodule: Secondary | ICD-10-CM | POA: Diagnosis not present

## 2023-07-22 DIAGNOSIS — C182 Malignant neoplasm of ascending colon: Secondary | ICD-10-CM

## 2023-07-22 DIAGNOSIS — C50911 Malignant neoplasm of unspecified site of right female breast: Secondary | ICD-10-CM | POA: Diagnosis not present

## 2023-07-22 DIAGNOSIS — Z5112 Encounter for antineoplastic immunotherapy: Secondary | ICD-10-CM | POA: Diagnosis not present

## 2023-07-22 DIAGNOSIS — C642 Malignant neoplasm of left kidney, except renal pelvis: Secondary | ICD-10-CM | POA: Diagnosis not present

## 2023-07-22 DIAGNOSIS — Z79811 Long term (current) use of aromatase inhibitors: Secondary | ICD-10-CM | POA: Diagnosis not present

## 2023-07-22 DIAGNOSIS — Z85038 Personal history of other malignant neoplasm of large intestine: Secondary | ICD-10-CM | POA: Diagnosis not present

## 2023-07-22 DIAGNOSIS — Z95828 Presence of other vascular implants and grafts: Secondary | ICD-10-CM

## 2023-07-22 DIAGNOSIS — Z79620 Long term (current) use of immunosuppressive biologic: Secondary | ICD-10-CM | POA: Diagnosis not present

## 2023-07-22 LAB — CBC WITH DIFFERENTIAL/PLATELET
Abs Immature Granulocytes: 0.02 10*3/uL (ref 0.00–0.07)
Basophils Absolute: 0.1 10*3/uL (ref 0.0–0.1)
Basophils Relative: 1 %
Eosinophils Absolute: 0.3 10*3/uL (ref 0.0–0.5)
Eosinophils Relative: 3 %
HCT: 39.3 % (ref 36.0–46.0)
Hemoglobin: 12.3 g/dL (ref 12.0–15.0)
Immature Granulocytes: 0 %
Lymphocytes Relative: 8 %
Lymphs Abs: 0.7 10*3/uL (ref 0.7–4.0)
MCH: 28.3 pg (ref 26.0–34.0)
MCHC: 31.3 g/dL (ref 30.0–36.0)
MCV: 90.6 fL (ref 80.0–100.0)
Monocytes Absolute: 0.6 10*3/uL (ref 0.1–1.0)
Monocytes Relative: 7 %
Neutro Abs: 6.8 10*3/uL (ref 1.7–7.7)
Neutrophils Relative %: 81 %
Platelets: 150 10*3/uL (ref 150–400)
RBC: 4.34 MIL/uL (ref 3.87–5.11)
RDW: 13.8 % (ref 11.5–15.5)
WBC: 8.5 10*3/uL (ref 4.0–10.5)
nRBC: 0 % (ref 0.0–0.2)

## 2023-07-22 LAB — COMPREHENSIVE METABOLIC PANEL
ALT: 15 U/L (ref 0–44)
AST: 18 U/L (ref 15–41)
Albumin: 3.8 g/dL (ref 3.5–5.0)
Alkaline Phosphatase: 86 U/L (ref 38–126)
Anion gap: 8 (ref 5–15)
BUN: 37 mg/dL — ABNORMAL HIGH (ref 8–23)
CO2: 23 mmol/L (ref 22–32)
Calcium: 9.3 mg/dL (ref 8.9–10.3)
Chloride: 107 mmol/L (ref 98–111)
Creatinine, Ser: 2.68 mg/dL — ABNORMAL HIGH (ref 0.44–1.00)
GFR, Estimated: 17 mL/min — ABNORMAL LOW (ref >60)
Glucose, Bld: 105 mg/dL — ABNORMAL HIGH (ref 70–99)
Potassium: 4.4 mmol/L (ref 3.5–5.1)
Sodium: 138 mmol/L (ref 135–145)
Total Bilirubin: 0.7 mg/dL (ref 0.3–1.2)
Total Protein: 6.4 g/dL — ABNORMAL LOW (ref 6.5–8.1)

## 2023-07-22 LAB — MAGNESIUM: Magnesium: 2 mg/dL (ref 1.7–2.4)

## 2023-07-22 LAB — TSH: TSH: 3.942 u[IU]/mL (ref 0.350–4.500)

## 2023-07-22 MED ORDER — SODIUM CHLORIDE 0.9 % IV SOLN: Freq: Once | INTRAVENOUS | Status: AC

## 2023-07-22 MED ORDER — HEPARIN SOD (PORK) LOCK FLUSH 100 UNIT/ML IV SOLN
500.0000 [IU] | Freq: Once | INTRAVENOUS | Status: AC | PRN
  Administered 2023-07-22: 500 [IU]

## 2023-07-22 MED ORDER — SODIUM CHLORIDE 0.9% FLUSH
10.0000 mL | INTRAVENOUS | Status: DC | PRN
  Administered 2023-07-22: 10 mL

## 2023-07-22 MED ORDER — SODIUM CHLORIDE 0.9% FLUSH
10.0000 mL | INTRAVENOUS | Status: DC | PRN
  Administered 2023-07-22: 10 mL via INTRAVENOUS

## 2023-07-22 MED ORDER — SODIUM CHLORIDE 0.9 % IV SOLN
480.0000 mg | Freq: Once | INTRAVENOUS | Status: AC
  Administered 2023-07-22: 480 mg via INTRAVENOUS
  Filled 2023-07-22: qty 48

## 2023-07-22 NOTE — Progress Notes (Signed)
Patients port flushed without difficulty.  Good blood return noted with no bruising or swelling noted at site.  VSS. Patient remains accessed for treatment.  

## 2023-07-22 NOTE — Progress Notes (Signed)
Treatment given per orders. Patient tolerated it well without problems. Vitals stable and discharged home from clinic ambulatory. Follow up as scheduled.  

## 2023-07-22 NOTE — Patient Instructions (Signed)
[facility] [place]  Discharge Instructions: Thank you for choosing [facility] to provide your oncology and hematology care.  If you have a lab appointment with the Cancer Center - please note that after April 8th, 2024, all labs will be drawn in the cancer center.  You do not have to check in or register with the main entrance as you have in the past but will complete your check-in in the cancer center.  Wear comfortable clothing and clothing appropriate for easy access to any Portacath or PICC line.   We strive to give you quality time with your provider. You may need to reschedule your appointment if you arrive late (15 or more minutes).  Arriving late affects you and other patients whose appointments are after yours.  Also, if you miss three or more appointments without notifying the office, you may be dismissed from the clinic at the provider's discretion.      For prescription refill requests, have your pharmacy contact our office and allow 72 hours for refills to be completed.    Today you received the following chemotherapy and/or immunotherapy agents opdivo   To help prevent nausea and vomiting after your treatment, we encourage you to take your nausea medication as directed.  BELOW ARE SYMPTOMS THAT SHOULD BE REPORTED IMMEDIATELY: *FEVER GREATER THAN 100.4 F (38 C) OR HIGHER *CHILLS OR SWEATING *NAUSEA AND VOMITING THAT IS NOT CONTROLLED WITH YOUR NAUSEA MEDICATION *UNUSUAL SHORTNESS OF BREATH *UNUSUAL BRUISING OR BLEEDING *URINARY PROBLEMS (pain or burning when urinating, or frequent urination) *BOWEL PROBLEMS (unusual diarrhea, constipation, pain near the anus) TENDERNESS IN MOUTH AND THROAT WITH OR WITHOUT PRESENCE OF ULCERS (sore throat, sores in mouth, or a toothache) UNUSUAL RASH, SWELLING OR PAIN  UNUSUAL VAGINAL DISCHARGE OR ITCHING   Items with * indicate a potential emergency and should be followed up as soon as possible or go to the  Emergency Department if any problems should occur.  Please show the CHEMOTHERAPY ALERT CARD or IMMUNOTHERAPY ALERT CARD at check-in to the Emergency Department and triage nurse.  Should you have questions after your visit or need to cancel or reschedule your appointment, please contact [facility] [place] [phone]  and follow the prompts.  Office hours are 8:00 a.m. to 4:30 p.m. Monday - Friday. Please note that voicemails left after 4:00 p.m. may not be returned until the following business day.  We are closed weekends and major holidays. You have access to a nurse at all times for urgent questions. Please call the main number to the clinic [phone] and follow the prompts.  For any non-urgent questions, you may also contact your provider using MyChart. We now offer e-Visits for anyone 18 and older to request care online for non-urgent symptoms. For details visit [email or web address].   Also download the MyChart app! Go to the app store, search "MyChart", open the app, select [place], and log in with your MyChart username and password.   

## 2023-07-22 NOTE — Progress Notes (Signed)
Labs reviewed with MD today. Creatinine is 2.68 today, ok to treat per MD.

## 2023-07-27 ENCOUNTER — Other Ambulatory Visit: Payer: Self-pay | Admitting: Hematology

## 2023-07-27 ENCOUNTER — Other Ambulatory Visit: Payer: Self-pay | Admitting: Cardiology

## 2023-07-31 DIAGNOSIS — N390 Urinary tract infection, site not specified: Secondary | ICD-10-CM | POA: Diagnosis not present

## 2023-08-04 MED ORDER — CLOPIDOGREL BISULFATE 75 MG PO TABS: 75.0000 mg | ORAL_TABLET | Freq: Every day | ORAL | 0 refills | Status: DC

## 2023-08-04 NOTE — Addendum Note (Signed)
Addended by: Burnetta Sabin on: 08/04/2023 11:27 AM   Modules accepted: Orders

## 2023-08-12 ENCOUNTER — Other Ambulatory Visit: Payer: Self-pay | Admitting: Hematology

## 2023-08-19 ENCOUNTER — Inpatient Hospital Stay: Payer: Medicare Other

## 2023-08-23 NOTE — Progress Notes (Signed)
[facility] [address] [place] [address]    Clinic Day:  08/23/2023  Referring physician: Tally Joe, MD  Patient Care Team: Tally Joe, MD as PCP - General (Family Medicine) Jake Bathe, MD as PCP - Cardiology (Cardiology) Karie Soda, MD as Consulting Physician (General Surgery) Jake Bathe, MD as Consulting Physician (Cardiology) Mansouraty, Netty Starring., MD as Consulting Physician (Gastroenterology) Mickie Bail, RN as Oncology Nurse Navigator Doreatha Massed, MD as Consulting Physician (Medical Oncology)   ASSESSMENT & PLAN:   Assessment: 1.  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  Right hemicolectomy on 10/11/2019, grade 3 adenocarcinoma, negative margins, positive LVSI, 2 tumor deposits, 0/15 lymph nodes positive, PT3PN1C. 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  MLH1 hyper methylation present. -PET scan in December 2020 showed lymph node in the right axillary region.  Biopsy consistent with metastatic colon cancer. -Last CEA was 10.7 on 11/22/2019. -Right axillary lymph node excision on 04/02/2020 consistent with metastatic carcinoma from colon cancer. -Pembrolizumab started on 05/14/2020. -PET scan on 08/05/2020 shows complete resolution of lymphadenopathy.  No evidence of new areas of uptake.  Mild vague residual hypermetabolism in the right axilla without soft tissue mass. -PET scan on 12/23/2020 with no evidence of recurrence. Rande Lawman held since we started opdivo on 08/27/2022   2.  Stage I (PT1CPN0) right Breast, Grade 2 Invasive Lobular Carcinoma: -MRI of the breast on 12/25/2019 showed 1 cm mass behind the right areola with a suspicious mass in the left breast. -Right breast lumpectomy on 04/02/2020 shows invasive lobular carcinoma, grade 2, 1.2 cm.  Resection margins are negative.  Negative LVSI.  2 sentinel lymph nodes  were negative for carcinoma.  ER/PR 100% positive, HER-2 negative, Ki-67 10%.   3.  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 - Based on her renal function, she is not a candidate for cisplatin based chemotherapy.  Nivolumab was recommended for 1 year. - PET scan (08/06/2022): No evidence of metastatic disease. - Opdivo for 1 year started on 08/27/2022    Plan: 1.  Stage IV colon cancer to the right axillary lymph node: - CT CAP on 05/21/2023: No evidence of recurrence.  Left lung nodule slightly increased in size. - Last CEA was 11.4.  Will check another CEA level.   2.  Right breast invasive lobular carcinoma, grade 2: - Continue anastrozole.  Last mammogram in May 2023 was normal. - Would recommend another mammogram.   3.  Left lung pulmonary nodule: - We reviewed CT CAP from 05/13/2023: Left lobe pulmonary nodule measures 7 mm, 4 mm in July 2023. - As it is subcentimeter, I do not recommend PET scan.  Will monitor on subsequent scan in 6 months.   4.  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 - Reviewed labs today: Normal LFTs.  Creatinine stable at 2.4.  CBC normal. - Proceed with opdivo every 4 weeks.  RTC 3 months for follow-up.   5.  Hypothyroidism: - TSH is 5.5 today.  Continue Synthroid 50  mcg daily.    No orders of the defined types were placed in this encounter.     Alben Deeds Teague,acting as a Neurosurgeon for [employer], MD.,have documented all relevant documentation on the behalf of  Doreatha Massed, MD,as directed by  Doreatha Massed, MD while in the presence of Doreatha Massed, MD.  ***   [place] R Teague   9/30/20249:25 PM  CHIEF COMPLAINT:   Diagnosis: stage I right breast cancer and colon cancer and left kidney urothelial carcinoma    Cancer Staging  Cancer of ascending colon s/p robotic proximal colectomy 10/11/2019 Staging form: Colon and Rectum, AJCC 8th Edition - Clinical stage from 10/23/2019: Stage IVA (cT3, cN1c, cM1a) - Signed by Doreatha Massed, MD on 12/14/2019  Malignant neoplasm of right female breast [facility]) Staging form: Breast, AJCC 8th Edition - Clinical stage from 04/23/2020: Stage IA (cT1c, cN0(sn), cM0, G2, ER+, PR+, HER2-) - Signed by Doreatha Massed, MD on 04/23/2020  Urothelial carcinoma of kidney, left [facility]) Staging form: Kidney, AJCC 8th Edition - Clinical stage from 08/18/2022: Stage III (cT3, cN0, cM0) - Unsigned    Prior Therapy: 1. Robotic proximal colectomy on 10/11/2019. 2. Right breast lumpectomy and SLNB on 04/02/2020 3.  Left radical nephro ureterectomy, lymphadenectomy, bladder cuff excision on 07/13/2022  Current Therapy:  Opdivo every 4 weeks    HISTORY OF PRESENT ILLNESS:   Oncology History  Cancer of ascending colon s/p robotic proximal colectomy 10/11/2019  10/11/2019 Initial Diagnosis   Cancer of ascending colon s/p robotic proximal colectomy 10/11/2019   10/23/2019 Cancer Staging   Staging form: Colon and Rectum, AJCC 8th Edition - Clinical stage from 10/23/2019: Stage IVA (cT3, cN1c, cM1a) - Signed by Doreatha Massed, MD on 12/14/2019   05/14/2020 - 06/26/2022 Chemotherapy   Patient is on Treatment Plan : COLORECTAL Pembrolizumab q21d     05/14/2020 - 06/26/2022 Chemotherapy   Patient is on Treatment Plan : COLORECTAL Pembrolizumab (200) q21d     08/27/2022 -  Chemotherapy   Patient is on Treatment Plan : COLORECTAL Nivolumab (480) q28d     Malignant neoplasm of right female breast (HCC)   04/23/2020 Initial Diagnosis   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 (HCC)   04/23/2020 Cancer Staging   Staging form: Breast, AJCC 8th Edition - Clinical stage from 04/23/2020: Stage IA (cT1c, cN0(sn), cM0, G2, ER+, PR+, HER2-) - Signed by Doreatha Massed, MD on 04/23/2020   Urothelial carcinoma of kidney, left (HCC)  08/18/2022 Initial Diagnosis   Urothelial carcinoma of kidney, left (HCC)   08/27/2022 -  Chemotherapy   Patient is on Treatment Plan : COLORECTAL Nivolumab (480) q28d        INTERVAL HISTORY:   Jessica Taylor is a 82 y.o. female presenting to clinic today for follow up of stage I right breast cancer, colon cancer, and left kidney urothelial carcinoma. She was last seen by me on 05/25/23.  Today, she states that she is doing well overall. Her appetite level is at ***%. Her energy level is at ***%.  PAST MEDICAL HISTORY:   Past Medical History: Past Medical History:  Diagnosis Date   Abdominal aortic aneurysm (HCC)    Acid reflux    Anemia    Arthritis    knees , R shoulder - tx /w injection - 11/2014   Basal cell carcinoma (BCC) of dorsum of nose 2010   Resolved   Cancer (HCC)    basal cell on nose   Colon cancer (HCC)  Coronary artery disease    Hypercholesteremia    Hypertension    Hypothyroidism    Lt Acute pyelonephritis 09/11/2018   MI, old 2000   Peripheral arterial disease (HCC)    hhigh-grade ostial bilateral calcified iliac stenosis with claudication   Tobacco abuse    Vertigo    when lays on left side.    Surgical History: Past Surgical History:  Procedure Laterality Date   BREAST LUMPECTOMY WITH RADIOACTIVE SEED AND SENTINEL LYMPH NODE BIOPSY Bilateral 04/02/2020   Procedure: BILATERAL BREAST LUMPECTOMY WITH RADIOACTIVE SEED AND RIGHT SENTINEL LYMPH NODE BIOPSY AND RIGHT TARGETED AXILLARY LYMPH NODE BIOPSY;  Surgeon: Harriette Bouillon, MD;  Location: [facility];  Service: General;  Laterality: Bilateral;   CARDIAC  CATHETERIZATION  5 stents   CARDIAC CATHETERIZATION N/A 01/20/2016   Procedure: Left Heart Cath and Coronary Angiography;  Surgeon: Peter M Swaziland, MD;  Location: [facility] INVASIVE CV LAB;  Service: Cardiovascular;  Laterality: N/A;   CATARACT EXTRACTION W/PHACO Left 05/24/2014   Procedure: CATARACT EXTRACTION PHACO AND INTRAOCULAR LENS PLACEMENT (IOC);  Surgeon: Gemma Payor, MD;  Location: [facility];  Service: Ophthalmology;  Laterality: Left;  CDE:  9.30   CATARACT EXTRACTION W/PHACO Right 06/18/2014   Procedure: CATARACT EXTRACTION PHACO AND INTRAOCULAR LENS PLACEMENT RIGHT EYE CDE=10.84;  Surgeon: Gemma Payor, MD;  Location: [facility];  Service: Ophthalmology;  Laterality: Right;   CORONARY ARTERY BYPASS GRAFT N/A 01/24/2016   Procedure: CORONARY ARTERY BYPASS GRAFTING (CABG);  Surgeon: Alleen Borne, MD;  Location: [facility] OR;  Service: Open Heart Surgery;  Laterality: N/A;   CORONARY STENT PLACEMENT     CORONARY STENT PLACEMENT  03/06/15   CFX DES   CYSTOSCOPY/URETEROSCOPY/HOLMIUM LASER/STENT PLACEMENT Left 09/12/2018   Procedure: CYSTOSCOPY/URETEROSCOPY/STENT PLACEMENT;  Surgeon: Rene Paci, MD;  Location: [facility];  Service: Urology;  Laterality: Left;   EYE SURGERY     LEFT HEART CATH AND CORS/GRAFTS ANGIOGRAPHY N/A 09/12/2019   Procedure: LEFT HEART CATH AND CORS/GRAFTS ANGIOGRAPHY;  Surgeon: Lennette Bihari, MD;  Location: [facility] INVASIVE CV LAB;  Service: Cardiovascular;  Laterality: N/A;   LEFT HEART CATHETERIZATION WITH CORONARY ANGIOGRAM N/A 03/06/2015   Procedure: LEFT HEART CATHETERIZATION WITH CORONARY ANGIOGRAM;  Surgeon: Runell Gess, MD;  Location: [facility] CATH LAB;  Service: Cardiovascular;  Laterality: N/A;   PARTIAL KNEE ARTHROPLASTY Right 02/04/2015   Procedure: UNICOMPARTMENTAL KNEE;  Surgeon: Jodi Geralds, MD;  Location: [facility] OR;  Service: Orthopedics;  Laterality: Right;   PERIPHERAL VASCULAR CATHETERIZATION Bilateral 05/13/2015   Procedure: Lower Extremity Angiography;  Surgeon: Runell Gess, MD;  Location: [facility] INVASIVE CV LAB;  Service: Cardiovascular;  Laterality: Bilateral;   PERIPHERAL VASCULAR CATHETERIZATION N/A 05/13/2015   Procedure: Abdominal Aortogram;  Surgeon: Runell Gess, MD;  Location: [facility] INVASIVE CV LAB;  Service: Cardiovascular;  Laterality: N/A;   PERIPHERAL VASCULAR CATHETERIZATION Bilateral 06/27/2015   Procedure: Peripheral Vascular Intervention;  Surgeon: Runell Gess, MD;  Location: [facility] INVASIVE CV LAB;  Service: Cardiovascular;  Laterality: Bilateral;  ILIACS   PERIPHERAL VASCULAR CATHETERIZATION Bilateral 06/27/2015   Procedure: Peripheral Vascular Atherectomy;  Surgeon: Runell Gess, MD;  Location: [facility] INVASIVE CV LAB;  Service: Cardiovascular;  Laterality: Bilateral;   PORTACATH PLACEMENT Left 05/24/2020   Procedure: INSERTION PORT-A-CATH;  Surgeon: Franky Macho, MD;  Location: [facility];  Service: General;  Laterality: Left;   TEE WITHOUT CARDIOVERSION N/A 01/24/2016   Procedure: TRANSESOPHAGEAL ECHOCARDIOGRAM (TEE);  Surgeon: Alleen Borne, MD;  Location: [facility] OR;  Service: Open Heart Surgery;  Laterality: N/A;   TONSILLECTOMY     age 82   TUBAL LIGATION      Social History: Social History   Socioeconomic History   Marital status: Married    Spouse name: Tommy   Number of children: 4   Years of education: Not on file   Highest education level: Not on file  Occupational History   Occupation: retired    Comment: worked as Tree surgeon for [employer]  Tobacco Use   Smoking status: Former    Current packs/day: 0.00    Average packs/day: 1.5 packs/day for 42.0 years (63.0 ttl pk-yrs)    Types: Cigarettes    Start date: 05/18/1957    Quit date: 05/19/1999    Years since quitting: 24.2   Smokeless tobacco: Never  Vaping Use   Vaping status: Never Used  Substance and Sexual Activity   Alcohol use: No   Drug use: No   Sexual activity: Yes    Birth control/protection: Surgical  Other Topics Concern   Not on file  Social  History Narrative   Not on file   Social Determinants of Health   Financial Resource Strain: Medium Risk (02/18/2021)   Overall Financial Resource Strain (CARDIA)    Difficulty of Paying Living Expenses: Somewhat hard  Food Insecurity: No Food Insecurity (02/18/2021)   Hunger Vital Sign    Worried About Running Out of Food in the Last Year: Never true    Ran Out of Food in the Last Year: Never true  Transportation Needs: No Transportation Needs (02/18/2021)   PRAPARE - Administrator, Civil Service (Medical): No    Lack of Transportation (Non-Medical): No  Physical Activity: Inactive (02/18/2021)   Exercise Vital Sign    Days of Exercise per Week: 0 days    Minutes of Exercise per Session: 0 min  Stress: No Stress Concern Present (02/18/2021)   [employer] of Occupational Health - Occupational Stress Questionnaire    Feeling of Stress : Not at all  Social Connections: Moderately Isolated (02/18/2021)   Social Connection and Isolation Panel [NHANES]    Frequency of Communication with Friends and Family: More than three times a week    Frequency of Social Gatherings with Friends and Family: Three times a week    Attends Religious Services: Never    Active Member of Clubs or Organizations: No    Attends Banker Meetings: Never    Marital Status: Married  Catering manager Violence: Not At Risk (02/18/2021)   Humiliation, Afraid, Rape, and Kick questionnaire    Fear of Current or Ex-Partner: No    Emotionally Abused: No    Physically Abused: No    Sexually Abused: No    Family History: Family History  Problem Relation Age of Onset   Ovarian cancer Mother 82   Cancer Father 63       unsure of which kind, "it was in his glands"   Hypertension Maternal Grandmother    Stroke Maternal Grandfather    Hypertension Son    Brain cancer Sister    Hypertension Daughter    Heart attack Neg Hx    Colon cancer Neg Hx    Esophageal cancer Neg Hx    Inflammatory  bowel disease Neg Hx    Liver disease Neg Hx    Pancreatic cancer Neg Hx    Rectal cancer Neg Hx    Stomach cancer Neg Hx     Current  Medications:  Current Outpatient Medications:    amLODipine (NORVASC) 5 MG tablet, TAKE 1 TABLET BY MOUTH ONCE A DAY., Disp: 30 tablet, Rfl: 0   anastrozole (ARIMIDEX) 1 MG tablet, Take 1 tablet (1 mg total) by mouth daily., Disp: 30 tablet, Rfl: 3   aspirin EC 81 MG tablet, Take 81 mg by mouth every evening. , Disp: , Rfl:    atorvastatin (LIPITOR) 40 MG tablet, TAKE (1) TABLET BY MOUTH ONCE DAILY., Disp: 90 tablet, Rfl: 0   betamethasone dipropionate 0.05 % cream, Apply topically 2 (two) times daily., Disp: 30 g, Rfl: 0   clopidogrel (PLAVIX) 75 MG tablet, Take 1 tablet (75 mg total) by mouth daily., Disp: 30 tablet, Rfl: 0   diclofenac (VOLTAREN) 50 MG EC tablet, Take 50 mg by mouth as needed for moderate pain., Disp: , Rfl:    esomeprazole (NEXIUM) 20 MG capsule, Take 20 mg by mouth daily. , Disp: , Rfl:    HYDROcodone-acetaminophen (NORCO/VICODIN) 5-325 MG tablet, Take 1 tablet by mouth daily., Disp: 15 tablet, Rfl: 0   hydrOXYzine (ATARAX) 25 MG tablet, TAKE (1) TABLET BY MOUTH AT BEDTIME AS NEEDED, Disp: 30 tablet, Rfl: 0   levothyroxine (SYNTHROID) 50 MCG tablet, TAKE 1/2 TABLET BY MOUTH DAILY., Disp: 30 tablet, Rfl: 0   metoprolol tartrate (LOPRESSOR) 25 MG tablet, TAKE (1) TABLET BY MOUTH TWICE DAILY., Disp: 60 tablet, Rfl: 0   nitroGLYCERIN (NITROSTAT) 0.4 MG SL tablet, DISSOLVE 1 TABLET SUBLINGUALLY AS NEEDED FOR CHEST PAIN, MAY REPEAT EVERY 5 MINUTES. AFTER 3 CALL 911., Disp: 25 tablet, Rfl: 4   nivolumab (OPDIVO) 100 MG/10ML SOLN chemo injection, as directed Intravenous once a month, Disp: , Rfl:    penicillin v potassium (VEETID) 500 MG tablet, Take 500 mg by mouth 4 (four) times daily., Disp: , Rfl:    Vitamin D, Ergocalciferol, (DRISDOL) 1.25 MG (50000 UNIT) CAPS capsule, TAKE 1 CAPSULE BY MOUTH ONCE A WEEK., Disp: 4 capsule, Rfl: 0 No  current facility-administered medications for this visit.  Facility-Administered Medications Ordered in Other Visits:    sodium chloride flush (NS) 0.9 % injection 10 mL, 10 mL, Intracatheter, PRN, Doreatha Massed, MD, 10 mL at 11/26/22 1631   sodium chloride flush (NS) 0.9 % injection 10 mL, 10 mL, Intracatheter, PRN, Doreatha Massed, MD, 10 mL at 03/02/23 1120   Allergies: Allergies  Allergen Reactions   Crab [Shellfish Allergy] Nausea And Vomiting    Throws up violently   Ezetimibe Diarrhea   Keflex [Cephalexin]     Caused sores   Other Nausea And Vomiting    Shrimp   Shrimp Extract     Other Reaction(s): GI Intolerance    REVIEW OF SYSTEMS:   Review of Systems  Constitutional:  Negative for chills, fatigue and fever.  HENT:   Negative for lump/mass, mouth sores, nosebleeds, sore throat and trouble swallowing.   Eyes:  Negative for eye problems.  Respiratory:  Negative for cough and shortness of breath.   Cardiovascular:  Negative for chest pain, leg swelling and palpitations.  Gastrointestinal:  Negative for abdominal pain, constipation, diarrhea, nausea and vomiting.  Genitourinary:  Negative for bladder incontinence, difficulty urinating, dysuria, frequency, hematuria and nocturia.   Musculoskeletal:  Negative for arthralgias, back pain, flank pain, myalgias and neck pain.  Skin:  Negative for itching and rash.  Neurological:  Negative for dizziness, headaches and numbness.  Hematological:  Does not bruise/bleed easily.  Psychiatric/Behavioral:  Negative for depression, sleep disturbance and suicidal ideas. The patient  is not nervous/anxious.   All other systems reviewed and are negative.    VITALS:   There were no vitals taken for this visit.  Wt Readings from Last 3 Encounters:  07/22/23 158 lb 9.6 oz (71.9 kg)  06/24/23 158 lb 9.6 oz (71.9 kg)  05/25/23 158 lb 3.2 oz (71.8 kg)    There is no height or weight on file to calculate BMI.  Performance  status (ECOG): 1 - Symptomatic but completely ambulatory  PHYSICAL EXAM:   Physical Exam Vitals and nursing note reviewed. Exam conducted with a chaperone present.  Constitutional:      Appearance: Normal appearance.  Cardiovascular:     Rate and Rhythm: Normal rate and regular rhythm.     Pulses: Normal pulses.     Heart sounds: Normal heart sounds.  Pulmonary:     Effort: Pulmonary effort is normal.     Breath sounds: Normal breath sounds.  Abdominal:     Palpations: Abdomen is soft. There is no hepatomegaly, splenomegaly or mass.     Tenderness: There is no abdominal tenderness.  Musculoskeletal:     Right lower leg: No edema.     Left lower leg: No edema.  Lymphadenopathy:     Cervical: No cervical adenopathy.     Right cervical: No superficial, deep or posterior cervical adenopathy.    Left cervical: No superficial, deep or posterior cervical adenopathy.     Upper Body:     Right upper body: No supraclavicular or axillary adenopathy.     Left upper body: No supraclavicular or axillary adenopathy.  Neurological:     General: No focal deficit present.     Mental Status: She is alert and oriented to person, place, and time.  Psychiatric:        Mood and Affect: Mood normal.        Behavior: Behavior normal.     LABS:      Latest Ref Rng & Units 07/22/2023   11:55 AM 06/24/2023    1:04 PM 05/25/2023   10:28 AM  CBC  WBC 4.0 - 10.5 K/uL 8.5  4.4  6.9   Hemoglobin 12.0 - 15.0 g/dL 16.1  09.6  04.5   Hematocrit 36.0 - 46.0 % 39.3  40.5  40.7   Platelets 150 - 400 K/uL 150  157  171       Latest Ref Rng & Units 07/22/2023   11:55 AM 06/24/2023    1:04 PM 05/25/2023   10:28 AM  CMP  Glucose 70 - 99 mg/dL 409  811  914   BUN 8 - 23 mg/dL 37  36  36   Creatinine 0.44 - 1.00 mg/dL 7.82  9.56  2.13   Sodium 135 - 145 mmol/L 138  138  138   Potassium 3.5 - 5.1 mmol/L 4.4  3.9  4.4   Chloride 98 - 111 mmol/L 107  107  107   CO2 22 - 32 mmol/L 23  22  22    Calcium 8.9 - 10.3  mg/dL 9.3  9.4  9.7   Total Protein 6.5 - 8.1 g/dL 6.4  7.0  6.7   Total Bilirubin 0.3 - 1.2 mg/dL 0.7  0.8  0.7   Alkaline Phos 38 - 126 U/L 86  84  86   AST 15 - 41 U/L 18  17  19    ALT 0 - 44 U/L 15  13  16       Lab Results  Component  Value Date   CEA1 10.5 (H) 06/24/2023   CEA 39.5 (H) 08/09/2019   /  CEA  Date Value Ref Range Status  06/24/2023 10.5 (H) 0.0 - 4.7 ng/mL Final    Comment:    (NOTE)                             Nonsmokers          <3.9                             Smokers             <5.6 Roche [employer]ostics Electrochemiluminescence Immunoassay (ECLIA) Values obtained with different assay methods or kits cannot be used interchangeably.  Results cannot be interpreted as absolute evidence of the presence or absence of malignant disease. Performed At: [facility] [address] [place], [place] [id number] Jolene Schimke MD QI:[id number]   08/09/2019 39.5 (H) ng/mL Final    Comment:    Non-Smoker: <2.5 Smoker:     <5.0 . . This test was performed using the Siemens  chemiluminescent method. Values obtained from different assay methods cannot be used interchangeably. CEA levels, regardless of value, should not be interpreted as absolute evidence of the presence or absence of disease. .    No results found for: "PSA1" No results found for: "OVF643" No results found for: "CAN125"  No results found for: "TOTALPROTELP", "ALBUMINELP", "A1GS", "A2GS", "BETS", "BETA2SER", "GAMS", "MSPIKE", "SPEI" Lab Results  Component Value Date   TIBC 357 06/04/2022   TIBC 269 11/22/2019   TIBC 271 10/23/2019   FERRITIN 10 (L) 06/04/2022   FERRITIN 230 11/22/2019   FERRITIN 47 10/23/2019   IRONPCTSAT 9 (L) 06/04/2022   IRONPCTSAT 31 11/22/2019   IRONPCTSAT 11 10/23/2019   No results found for: "LDH"   STUDIES:   No results found.

## 2023-08-24 ENCOUNTER — Other Ambulatory Visit (HOSPITAL_COMMUNITY): Payer: Self-pay | Admitting: Hematology

## 2023-08-24 ENCOUNTER — Inpatient Hospital Stay: Payer: Medicare Other | Admitting: Hematology

## 2023-08-24 ENCOUNTER — Inpatient Hospital Stay: Payer: Medicare Other

## 2023-08-24 ENCOUNTER — Inpatient Hospital Stay: Payer: Medicare Other | Attending: Hematology

## 2023-08-24 VITALS — BP 121/54 | HR 60 | Temp 97.0°F | Resp 18

## 2023-08-24 DIAGNOSIS — Z170 Estrogen receptor positive status [ER+]: Secondary | ICD-10-CM | POA: Insufficient documentation

## 2023-08-24 DIAGNOSIS — Z5112 Encounter for antineoplastic immunotherapy: Secondary | ICD-10-CM | POA: Diagnosis not present

## 2023-08-24 DIAGNOSIS — E0590 Thyrotoxicosis, unspecified without thyrotoxic crisis or storm: Secondary | ICD-10-CM

## 2023-08-24 DIAGNOSIS — E039 Hypothyroidism, unspecified: Secondary | ICD-10-CM | POA: Insufficient documentation

## 2023-08-24 DIAGNOSIS — R911 Solitary pulmonary nodule: Secondary | ICD-10-CM | POA: Insufficient documentation

## 2023-08-24 DIAGNOSIS — C182 Malignant neoplasm of ascending colon: Secondary | ICD-10-CM | POA: Diagnosis not present

## 2023-08-24 DIAGNOSIS — C50911 Malignant neoplasm of unspecified site of right female breast: Secondary | ICD-10-CM | POA: Insufficient documentation

## 2023-08-24 DIAGNOSIS — C642 Malignant neoplasm of left kidney, except renal pelvis: Secondary | ICD-10-CM

## 2023-08-24 DIAGNOSIS — Z85038 Personal history of other malignant neoplasm of large intestine: Secondary | ICD-10-CM | POA: Insufficient documentation

## 2023-08-24 DIAGNOSIS — Z79620 Long term (current) use of immunosuppressive biologic: Secondary | ICD-10-CM | POA: Insufficient documentation

## 2023-08-24 DIAGNOSIS — Z79811 Long term (current) use of aromatase inhibitors: Secondary | ICD-10-CM | POA: Insufficient documentation

## 2023-08-24 DIAGNOSIS — Z853 Personal history of malignant neoplasm of breast: Secondary | ICD-10-CM

## 2023-08-24 DIAGNOSIS — Z87891 Personal history of nicotine dependence: Secondary | ICD-10-CM | POA: Insufficient documentation

## 2023-08-24 DIAGNOSIS — C652 Malignant neoplasm of left renal pelvis: Secondary | ICD-10-CM | POA: Insufficient documentation

## 2023-08-24 DIAGNOSIS — Z1721 Progesterone receptor positive status: Secondary | ICD-10-CM | POA: Diagnosis not present

## 2023-08-24 DIAGNOSIS — Z95828 Presence of other vascular implants and grafts: Secondary | ICD-10-CM

## 2023-08-24 LAB — CBC WITH DIFFERENTIAL/PLATELET
Abs Immature Granulocytes: 0.01 10*3/uL (ref 0.00–0.07)
Basophils Absolute: 0.1 10*3/uL (ref 0.0–0.1)
Basophils Relative: 2 %
Eosinophils Absolute: 0.1 10*3/uL (ref 0.0–0.5)
Eosinophils Relative: 2 %
HCT: 39.4 % (ref 36.0–46.0)
Hemoglobin: 12.3 g/dL (ref 12.0–15.0)
Immature Granulocytes: 0 %
Lymphocytes Relative: 25 %
Lymphs Abs: 1.3 10*3/uL (ref 0.7–4.0)
MCH: 28.4 pg (ref 26.0–34.0)
MCHC: 31.2 g/dL (ref 30.0–36.0)
MCV: 91 fL (ref 80.0–100.0)
Monocytes Absolute: 0.4 10*3/uL (ref 0.1–1.0)
Monocytes Relative: 7 %
Neutro Abs: 3.3 10*3/uL (ref 1.7–7.7)
Neutrophils Relative %: 64 %
Platelets: 181 10*3/uL (ref 150–400)
RBC: 4.33 MIL/uL (ref 3.87–5.11)
RDW: 14.3 % (ref 11.5–15.5)
WBC: 5.1 10*3/uL (ref 4.0–10.5)
nRBC: 0 % (ref 0.0–0.2)

## 2023-08-24 LAB — COMPREHENSIVE METABOLIC PANEL
ALT: 12 U/L (ref 0–44)
AST: 15 U/L (ref 15–41)
Albumin: 3.7 g/dL (ref 3.5–5.0)
Alkaline Phosphatase: 84 U/L (ref 38–126)
Anion gap: 10 (ref 5–15)
BUN: 24 mg/dL — ABNORMAL HIGH (ref 8–23)
CO2: 23 mmol/L (ref 22–32)
Calcium: 9.4 mg/dL (ref 8.9–10.3)
Chloride: 103 mmol/L (ref 98–111)
Creatinine, Ser: 2.52 mg/dL — ABNORMAL HIGH (ref 0.44–1.00)
GFR, Estimated: 18 mL/min — ABNORMAL LOW (ref >60)
Glucose, Bld: 106 mg/dL — ABNORMAL HIGH (ref 70–99)
Potassium: 3.8 mmol/L (ref 3.5–5.1)
Sodium: 136 mmol/L (ref 135–145)
Total Bilirubin: 0.4 mg/dL (ref 0.3–1.2)
Total Protein: 6.6 g/dL (ref 6.5–8.1)

## 2023-08-24 LAB — TSH: TSH: 4.448 u[IU]/mL (ref 0.350–4.500)

## 2023-08-24 LAB — MAGNESIUM: Magnesium: 2.1 mg/dL (ref 1.7–2.4)

## 2023-08-24 MED ORDER — SODIUM CHLORIDE 0.9% FLUSH
10.0000 mL | INTRAVENOUS | Status: DC | PRN
  Administered 2023-08-24: 10 mL

## 2023-08-24 MED ORDER — SODIUM CHLORIDE 0.9 % IV SOLN: Freq: Once | INTRAVENOUS | Status: AC

## 2023-08-24 MED ORDER — SODIUM CHLORIDE 0.9 % IV SOLN
480.0000 mg | Freq: Once | INTRAVENOUS | Status: AC
  Administered 2023-08-24: 480 mg via INTRAVENOUS
  Filled 2023-08-24: qty 48

## 2023-08-24 MED ORDER — HEPARIN SOD (PORK) LOCK FLUSH 100 UNIT/ML IV SOLN
500.0000 [IU] | Freq: Once | INTRAVENOUS | Status: AC | PRN
  Administered 2023-08-24: 500 [IU]

## 2023-08-24 MED ORDER — SODIUM CHLORIDE 0.9% FLUSH
10.0000 mL | Freq: Once | INTRAVENOUS | Status: AC
  Administered 2023-08-24: 10 mL

## 2023-08-24 NOTE — Progress Notes (Signed)
Patient presents today for Opdivo infusion per providers order.  Vital signs and labs reviewed by MD.  Message received from Chapman Moss RN/Dr.Katragadda patient okay for treatment.  Treatment given today per MD orders.  Tolerated infusion without adverse affects.  Vital signs stable.  No complaints at this time.  Discharge from clinic ambulatory in stable condition.  Alert and oriented X 3.  Follow up with [facility] as scheduled.

## 2023-08-24 NOTE — Patient Instructions (Signed)

## 2023-08-25 ENCOUNTER — Other Ambulatory Visit: Payer: Self-pay

## 2023-08-25 DIAGNOSIS — I129 Hypertensive chronic kidney disease with stage 1 through stage 4 chronic kidney disease, or unspecified chronic kidney disease: Secondary | ICD-10-CM | POA: Diagnosis not present

## 2023-08-25 DIAGNOSIS — C182 Malignant neoplasm of ascending colon: Secondary | ICD-10-CM | POA: Diagnosis not present

## 2023-08-25 DIAGNOSIS — N184 Chronic kidney disease, stage 4 (severe): Secondary | ICD-10-CM | POA: Diagnosis not present

## 2023-08-25 DIAGNOSIS — R809 Proteinuria, unspecified: Secondary | ICD-10-CM | POA: Diagnosis not present

## 2023-08-25 LAB — CEA: CEA: 10.8 ng/mL — ABNORMAL HIGH (ref 0.0–4.7)

## 2023-08-26 ENCOUNTER — Encounter: Payer: Self-pay | Admitting: Cardiology

## 2023-08-26 ENCOUNTER — Other Ambulatory Visit: Payer: Self-pay

## 2023-08-26 ENCOUNTER — Ambulatory Visit: Payer: Medicare Other | Attending: Cardiology | Admitting: Cardiology

## 2023-08-26 VITALS — BP 106/74 | HR 64 | Ht 63.0 in | Wt 157.8 lb

## 2023-08-26 DIAGNOSIS — I739 Peripheral vascular disease, unspecified: Secondary | ICD-10-CM | POA: Diagnosis not present

## 2023-08-26 DIAGNOSIS — I2510 Atherosclerotic heart disease of native coronary artery without angina pectoris: Secondary | ICD-10-CM

## 2023-08-26 DIAGNOSIS — Z9861 Coronary angioplasty status: Secondary | ICD-10-CM

## 2023-08-26 DIAGNOSIS — I10 Essential (primary) hypertension: Secondary | ICD-10-CM | POA: Diagnosis not present

## 2023-08-26 NOTE — Patient Instructions (Signed)
Medication Instructions:  Please discontinue your Aspirin. Continue all other medications as listed.  *If you need a refill on your cardiac medications before your next appointment, please call your pharmacy*  Follow-Up: At [facility], you and your health needs are our priority.  As part of our continuing mission to provide you with exceptional heart care, we have created designated Provider Care Teams.  These Care Teams include your primary Cardiologist (physician) and Advanced Practice Providers (APPs -  Physician Assistants and Nurse Practitioners) who all work together to provide you with the care you need, when you need it.  We recommend signing up for the patient portal called "MyChart".  Sign up information is provided on this After Visit Summary.  MyChart is used to connect with patients for Virtual Visits (Telemedicine).  Patients are able to view lab/test results, encounter notes, upcoming appointments, etc.  Non-urgent messages can be sent to your provider as well.   To learn more about what you can do with MyChart, go to [email or web address].    Your next appointment:   1 year(s)  Provider:   Donato Schultz, MD

## 2023-08-26 NOTE — Progress Notes (Signed)
Cardiology Office Note:  .   Date:  08/26/2023  ID:  Jessica Taylor, DOB 21-Mar-1941, MRN [id number] PCP: Tally Joe, MD  [facility] Providers Cardiologist:  Donato Schultz, MD    History of Present Illness: Jessica Taylor is a 82 y.o. female Discussed the use of AI scribe software for clinical note transcription with the patient, who gave verbal consent to proceed.  History of Present Illness   The patient, with a complex medical history including coronary artery disease status post bypass surgery in 2017, peripheral arterial disease status post bilateral iliac stenting in 2016, and a history of cancer necessitating colectomy, mastectomy, and nephrectomy, reports feeling generally well. She notes a lack of energy and chronic knee pain, but otherwise has no new complaints. She denies any new chest pain or significant shortness of breath, only experiencing mild dyspnea on exertion after walking a significant distance.  The patient has been managing her cardiovascular health with atorvastatin, metoprolol, amlodipine, and clopidogrel, having discontinued aspirin. She reports good compliance with her medication regimen. She has been mindful of her kidney function, avoiding nephrotoxic medications like ibuprofen due to a known elevation in creatinine.  The patient's most recent cardiac catheterization in October 2020 showed patent bypass grafts. Despite her extensive medical history, the patient reports feeling good and maintains an active lifestyle.           Studies Reviewed: Marland Kitchen   EKG Interpretation Date/Time:  Thursday August 26 2023 15:44:53 EDT Ventricular Rate:  64 PR Interval:  148 QRS Duration:  136 QT Interval:  454 QTC Calculation: 468 R Axis:   -28  Text Interpretation: Normal sinus rhythm Left bundle branch block When compared with ECG of 14-Oct-2021 11:13, Vent. rate has decreased BY  35 BPM Confirmed by Donato Schultz ([id number]) on 08/26/2023 3:55:24 PM     Risk  Assessment/Calculations:            Physical Exam:   VS:  BP 106/74   Pulse 64   Ht 5\' 3"  (1.6 m)   Wt 157 lb 12.8 oz (71.6 kg)   LMP  (LMP Unknown)   SpO2 97%   BMI 27.95 kg/m    Wt Readings from Last 3 Encounters:  08/26/23 157 lb 12.8 oz (71.6 kg)  08/24/23 156 lb 6.4 oz (70.9 kg)  07/22/23 158 lb 9.6 oz (71.9 kg)    GEN: Well nourished, well developed in no acute distress NECK: No JVD; No carotid bruits CARDIAC: CABG scar RRR, no murmurs, no rubs, no gallops RESPIRATORY:  Clear to auscultation without rales, wheezing or rhonchi  ABDOMEN: Soft, non-tender, non-distended, small petechiae lower extremity EXTREMITIES:  No edema; No deformity   ASSESSMENT AND PLAN: .    Assessment and Plan    Coronary Artery Disease Status post three vessel bypass in 2017 with LIMA to LAD, SVG to ramus, SVG to PDA. No new symptoms of chest pain or shortness of breath. Last stress test in 2020 was low risk and heart catheterization in October 2020 showed patent bypass grafts. -Continue Atorvastatin 40mg  daily for LDL control (last LDL 46). -Continue Plavix for antiplatelet therapy (patient has discontinued Aspirin on her own, which is acceptable).  Peripheral Artery Disease Status post bilateral iliac stenting in 2016. No new symptoms of claudication reported. -Continue Plavix for antiplatelet therapy. No claudication  Hypertension Well controlled on current regimen. -Continue Metoprolol 25mg  twice daily and Amlodipine 5mg  daily.  Chronic Kidney Disease Creatinine 2.6 in August 2024  indicating reduced kidney function. -Avoid nephrotoxic medications like Ibuprofen.  General Health Maintenance / Followup Plans -Annual follow-up visits for cardiovascular health maintenance. -Encourage physical activity as tolerated.               Signed, Donato Schultz, MD

## 2023-08-27 DIAGNOSIS — I739 Peripheral vascular disease, unspecified: Secondary | ICD-10-CM | POA: Diagnosis not present

## 2023-08-27 DIAGNOSIS — I25111 Atherosclerotic heart disease of native coronary artery with angina pectoris with documented spasm: Secondary | ICD-10-CM | POA: Diagnosis not present

## 2023-08-27 DIAGNOSIS — E559 Vitamin D deficiency, unspecified: Secondary | ICD-10-CM | POA: Diagnosis not present

## 2023-08-27 DIAGNOSIS — M170 Bilateral primary osteoarthritis of knee: Secondary | ICD-10-CM | POA: Diagnosis not present

## 2023-08-27 DIAGNOSIS — C182 Malignant neoplasm of ascending colon: Secondary | ICD-10-CM | POA: Diagnosis not present

## 2023-08-27 DIAGNOSIS — N1832 Chronic kidney disease, stage 3b: Secondary | ICD-10-CM | POA: Diagnosis not present

## 2023-08-27 DIAGNOSIS — E039 Hypothyroidism, unspecified: Secondary | ICD-10-CM | POA: Diagnosis not present

## 2023-08-27 DIAGNOSIS — Z23 Encounter for immunization: Secondary | ICD-10-CM | POA: Diagnosis not present

## 2023-08-27 DIAGNOSIS — I252 Old myocardial infarction: Secondary | ICD-10-CM | POA: Diagnosis not present

## 2023-08-27 DIAGNOSIS — C50911 Malignant neoplasm of unspecified site of right female breast: Secondary | ICD-10-CM | POA: Diagnosis not present

## 2023-08-27 DIAGNOSIS — C642 Malignant neoplasm of left kidney, except renal pelvis: Secondary | ICD-10-CM | POA: Diagnosis not present

## 2023-08-27 DIAGNOSIS — E782 Mixed hyperlipidemia: Secondary | ICD-10-CM | POA: Diagnosis not present

## 2023-08-28 ENCOUNTER — Other Ambulatory Visit: Payer: Self-pay

## 2023-09-02 ENCOUNTER — Other Ambulatory Visit: Payer: Self-pay | Admitting: *Deleted

## 2023-09-02 MED ORDER — VITAMIN D (ERGOCALCIFEROL) 1.25 MG (50000 UNIT) PO CAPS: 50000.0000 [IU] | ORAL_CAPSULE | ORAL | 0 refills | Status: DC

## 2023-09-02 MED ORDER — HYDROXYZINE HCL 25 MG PO TABS: ORAL_TABLET | ORAL | 0 refills | Status: DC

## 2023-09-09 ENCOUNTER — Other Ambulatory Visit: Payer: Self-pay

## 2023-09-09 MED ORDER — CLOPIDOGREL BISULFATE 75 MG PO TABS: 75.0000 mg | ORAL_TABLET | Freq: Every day | ORAL | 3 refills | Status: DC

## 2023-09-15 ENCOUNTER — Other Ambulatory Visit: Payer: Self-pay

## 2023-09-15 DIAGNOSIS — C50911 Malignant neoplasm of unspecified site of right female breast: Secondary | ICD-10-CM

## 2023-09-15 DIAGNOSIS — N189 Chronic kidney disease, unspecified: Secondary | ICD-10-CM | POA: Diagnosis not present

## 2023-09-15 DIAGNOSIS — I714 Abdominal aortic aneurysm, without rupture, unspecified: Secondary | ICD-10-CM | POA: Diagnosis not present

## 2023-09-15 DIAGNOSIS — I129 Hypertensive chronic kidney disease with stage 1 through stage 4 chronic kidney disease, or unspecified chronic kidney disease: Secondary | ICD-10-CM | POA: Diagnosis not present

## 2023-09-15 DIAGNOSIS — C182 Malignant neoplasm of ascending colon: Secondary | ICD-10-CM | POA: Diagnosis not present

## 2023-09-15 DIAGNOSIS — C642 Malignant neoplasm of left kidney, except renal pelvis: Secondary | ICD-10-CM | POA: Diagnosis not present

## 2023-09-15 MED ORDER — AMLODIPINE BESYLATE 5 MG PO TABS: 5.0000 mg | ORAL_TABLET | Freq: Every day | ORAL | 0 refills | Status: DC

## 2023-09-15 MED ORDER — ANASTROZOLE 1 MG PO TABS: 1.0000 mg | ORAL_TABLET | Freq: Every day | ORAL | 3 refills | Status: DC

## 2023-09-16 ENCOUNTER — Other Ambulatory Visit: Payer: Self-pay | Admitting: *Deleted

## 2023-09-21 ENCOUNTER — Inpatient Hospital Stay: Payer: Medicare Other

## 2023-09-21 ENCOUNTER — Encounter: Payer: Self-pay | Admitting: Hematology

## 2023-09-21 VITALS — BP 147/50 | HR 67 | Temp 97.3°F | Resp 18

## 2023-09-21 DIAGNOSIS — Z85038 Personal history of other malignant neoplasm of large intestine: Secondary | ICD-10-CM | POA: Diagnosis not present

## 2023-09-21 DIAGNOSIS — R911 Solitary pulmonary nodule: Secondary | ICD-10-CM | POA: Diagnosis not present

## 2023-09-21 DIAGNOSIS — C50911 Malignant neoplasm of unspecified site of right female breast: Secondary | ICD-10-CM | POA: Diagnosis not present

## 2023-09-21 DIAGNOSIS — C182 Malignant neoplasm of ascending colon: Secondary | ICD-10-CM

## 2023-09-21 DIAGNOSIS — Z1721 Progesterone receptor positive status: Secondary | ICD-10-CM | POA: Diagnosis not present

## 2023-09-21 DIAGNOSIS — C652 Malignant neoplasm of left renal pelvis: Secondary | ICD-10-CM | POA: Diagnosis not present

## 2023-09-21 DIAGNOSIS — Z95828 Presence of other vascular implants and grafts: Secondary | ICD-10-CM

## 2023-09-21 DIAGNOSIS — Z87891 Personal history of nicotine dependence: Secondary | ICD-10-CM | POA: Diagnosis not present

## 2023-09-21 DIAGNOSIS — Z79811 Long term (current) use of aromatase inhibitors: Secondary | ICD-10-CM | POA: Diagnosis not present

## 2023-09-21 DIAGNOSIS — Z170 Estrogen receptor positive status [ER+]: Secondary | ICD-10-CM | POA: Diagnosis not present

## 2023-09-21 DIAGNOSIS — Z79620 Long term (current) use of immunosuppressive biologic: Secondary | ICD-10-CM | POA: Diagnosis not present

## 2023-09-21 DIAGNOSIS — E0590 Thyrotoxicosis, unspecified without thyrotoxic crisis or storm: Secondary | ICD-10-CM

## 2023-09-21 DIAGNOSIS — C642 Malignant neoplasm of left kidney, except renal pelvis: Secondary | ICD-10-CM

## 2023-09-21 DIAGNOSIS — Z5112 Encounter for antineoplastic immunotherapy: Secondary | ICD-10-CM | POA: Diagnosis not present

## 2023-09-21 DIAGNOSIS — E039 Hypothyroidism, unspecified: Secondary | ICD-10-CM | POA: Diagnosis not present

## 2023-09-21 LAB — CBC WITH DIFFERENTIAL/PLATELET
Abs Immature Granulocytes: 0.02 10*3/uL (ref 0.00–0.07)
Basophils Absolute: 0.1 10*3/uL (ref 0.0–0.1)
Basophils Relative: 1 %
Eosinophils Absolute: 0.1 10*3/uL (ref 0.0–0.5)
Eosinophils Relative: 2 %
HCT: 40.7 % (ref 36.0–46.0)
Hemoglobin: 12.8 g/dL (ref 12.0–15.0)
Immature Granulocytes: 0 %
Lymphocytes Relative: 24 %
Lymphs Abs: 1.1 10*3/uL (ref 0.7–4.0)
MCH: 28.3 pg (ref 26.0–34.0)
MCHC: 31.4 g/dL (ref 30.0–36.0)
MCV: 90 fL (ref 80.0–100.0)
Monocytes Absolute: 0.3 10*3/uL (ref 0.1–1.0)
Monocytes Relative: 6 %
Neutro Abs: 3.1 10*3/uL (ref 1.7–7.7)
Neutrophils Relative %: 67 %
Platelets: 163 10*3/uL (ref 150–400)
RBC: 4.52 MIL/uL (ref 3.87–5.11)
RDW: 14.7 % (ref 11.5–15.5)
WBC: 4.6 10*3/uL (ref 4.0–10.5)
nRBC: 0 % (ref 0.0–0.2)

## 2023-09-21 LAB — COMPREHENSIVE METABOLIC PANEL
ALT: 12 U/L (ref 0–44)
AST: 16 U/L (ref 15–41)
Albumin: 3.9 g/dL (ref 3.5–5.0)
Alkaline Phosphatase: 78 U/L (ref 38–126)
Anion gap: 8 (ref 5–15)
BUN: 18 mg/dL (ref 8–23)
CO2: 23 mmol/L (ref 22–32)
Calcium: 9.5 mg/dL (ref 8.9–10.3)
Chloride: 107 mmol/L (ref 98–111)
Creatinine, Ser: 2.31 mg/dL — ABNORMAL HIGH (ref 0.44–1.00)
GFR, Estimated: 20 mL/min — ABNORMAL LOW (ref >60)
Glucose, Bld: 116 mg/dL — ABNORMAL HIGH (ref 70–99)
Potassium: 3.7 mmol/L (ref 3.5–5.1)
Sodium: 138 mmol/L (ref 135–145)
Total Bilirubin: 0.8 mg/dL (ref 0.3–1.2)
Total Protein: 6.8 g/dL (ref 6.5–8.1)

## 2023-09-21 LAB — TSH: TSH: 3.694 u[IU]/mL (ref 0.350–4.500)

## 2023-09-21 LAB — MAGNESIUM: Magnesium: 1.9 mg/dL (ref 1.7–2.4)

## 2023-09-21 MED ORDER — SODIUM CHLORIDE 0.9 % IV SOLN
480.0000 mg | Freq: Once | INTRAVENOUS | Status: AC
  Administered 2023-09-21: 480 mg via INTRAVENOUS
  Filled 2023-09-21: qty 48

## 2023-09-21 MED ORDER — SODIUM CHLORIDE 0.9 % IV SOLN: Freq: Once | INTRAVENOUS | Status: AC

## 2023-09-21 MED ORDER — SODIUM CHLORIDE 0.9% FLUSH
10.0000 mL | INTRAVENOUS | Status: DC | PRN
  Administered 2023-09-21: 10 mL

## 2023-09-21 MED ORDER — SODIUM CHLORIDE 0.9% FLUSH
10.0000 mL | Freq: Once | INTRAVENOUS | Status: AC
Start: 2023-09-21 — End: 2023-09-21
  Administered 2023-09-21: 10 mL via INTRAVENOUS

## 2023-09-21 MED ORDER — HEPARIN SOD (PORK) LOCK FLUSH 100 UNIT/ML IV SOLN
500.0000 [IU] | Freq: Once | INTRAVENOUS | Status: AC | PRN
  Administered 2023-09-21: 500 [IU]

## 2023-09-21 NOTE — Patient Instructions (Signed)
[facility] [place]  Discharge Instructions: Thank you for choosing [facility] to provide your oncology and hematology care.  If you have a lab appointment with the Cancer Center - please note that after April 8th, 2024, all labs will be drawn in the cancer center.  You do not have to check in or register with the main entrance as you have in the past but will complete your check-in in the cancer center.  Wear comfortable clothing and clothing appropriate for easy access to any Portacath or PICC line.   We strive to give you quality time with your provider. You may need to reschedule your appointment if you arrive late (15 or more minutes).  Arriving late affects you and other patients whose appointments are after yours.  Also, if you miss three or more appointments without notifying the office, you may be dismissed from the clinic at the provider's discretion.      For prescription refill requests, have your pharmacy contact our office and allow 72 hours for refills to be completed.    Today you received the following chemotherapy and/or immunotherapy agents opdivo   To help prevent nausea and vomiting after your treatment, we encourage you to take your nausea medication as directed.  BELOW ARE SYMPTOMS THAT SHOULD BE REPORTED IMMEDIATELY: *FEVER GREATER THAN 100.4 F (38 C) OR HIGHER *CHILLS OR SWEATING *NAUSEA AND VOMITING THAT IS NOT CONTROLLED WITH YOUR NAUSEA MEDICATION *UNUSUAL SHORTNESS OF BREATH *UNUSUAL BRUISING OR BLEEDING *URINARY PROBLEMS (pain or burning when urinating, or frequent urination) *BOWEL PROBLEMS (unusual diarrhea, constipation, pain near the anus) TENDERNESS IN MOUTH AND THROAT WITH OR WITHOUT PRESENCE OF ULCERS (sore throat, sores in mouth, or a toothache) UNUSUAL RASH, SWELLING OR PAIN  UNUSUAL VAGINAL DISCHARGE OR ITCHING   Items with * indicate a potential emergency and should be followed up as soon as possible or go to the  Emergency Department if any problems should occur.  Please show the CHEMOTHERAPY ALERT CARD or IMMUNOTHERAPY ALERT CARD at check-in to the Emergency Department and triage nurse.  Should you have questions after your visit or need to cancel or reschedule your appointment, please contact [facility] [place] [phone]  and follow the prompts.  Office hours are 8:00 a.m. to 4:30 p.m. Monday - Friday. Please note that voicemails left after 4:00 p.m. may not be returned until the following business day.  We are closed weekends and major holidays. You have access to a nurse at all times for urgent questions. Please call the main number to the clinic [phone] and follow the prompts.  For any non-urgent questions, you may also contact your provider using MyChart. We now offer e-Visits for anyone 18 and older to request care online for non-urgent symptoms. For details visit [email or web address].   Also download the MyChart app! Go to the app store, search "MyChart", open the app, select [place], and log in with your MyChart username and password.   

## 2023-09-21 NOTE — Progress Notes (Signed)

## 2023-09-22 LAB — CEA: CEA: 11.2 ng/mL — ABNORMAL HIGH (ref 0.0–4.7)

## 2023-09-23 DIAGNOSIS — N184 Chronic kidney disease, stage 4 (severe): Secondary | ICD-10-CM | POA: Diagnosis not present

## 2023-09-23 DIAGNOSIS — R809 Proteinuria, unspecified: Secondary | ICD-10-CM | POA: Diagnosis not present

## 2023-09-23 DIAGNOSIS — I129 Hypertensive chronic kidney disease with stage 1 through stage 4 chronic kidney disease, or unspecified chronic kidney disease: Secondary | ICD-10-CM | POA: Diagnosis not present

## 2023-09-23 DIAGNOSIS — C182 Malignant neoplasm of ascending colon: Secondary | ICD-10-CM | POA: Diagnosis not present

## 2023-09-28 ENCOUNTER — Ambulatory Visit (HOSPITAL_COMMUNITY)
Admission: RE | Admit: 2023-09-28 | Discharge: 2023-09-28 | Disposition: A | Discharge status: Home / Self Care | Payer: Medicare Other | Source: Ambulatory Visit | Attending: Hematology | Admitting: Hematology

## 2023-09-28 ENCOUNTER — Encounter (HOSPITAL_COMMUNITY): Payer: Self-pay

## 2023-09-28 ENCOUNTER — Ambulatory Visit (HOSPITAL_COMMUNITY)
Admission: RE | Admit: 2023-09-28 | Discharge: 2023-09-28 | Discharge status: Home / Self Care | Payer: Medicare Other | Source: Ambulatory Visit | Attending: Hematology | Admitting: Hematology

## 2023-09-28 DIAGNOSIS — Z853 Personal history of malignant neoplasm of breast: Secondary | ICD-10-CM

## 2023-09-28 DIAGNOSIS — C50911 Malignant neoplasm of unspecified site of right female breast: Secondary | ICD-10-CM | POA: Diagnosis not present

## 2023-09-28 DIAGNOSIS — R921 Mammographic calcification found on diagnostic imaging of breast: Secondary | ICD-10-CM | POA: Diagnosis not present

## 2023-09-29 ENCOUNTER — Other Ambulatory Visit (HOSPITAL_COMMUNITY): Payer: Self-pay | Admitting: Hematology

## 2023-09-29 DIAGNOSIS — R921 Mammographic calcification found on diagnostic imaging of breast: Secondary | ICD-10-CM

## 2023-10-07 ENCOUNTER — Ambulatory Visit
Admission: RE | Admit: 2023-10-07 | Discharge: 2023-10-07 | Disposition: A | Discharge status: Home / Self Care | Payer: Medicare Other | Source: Ambulatory Visit | Attending: Hematology | Admitting: Hematology

## 2023-10-07 DIAGNOSIS — N6021 Fibroadenosis of right breast: Secondary | ICD-10-CM | POA: Diagnosis not present

## 2023-10-07 DIAGNOSIS — R921 Mammographic calcification found on diagnostic imaging of breast: Secondary | ICD-10-CM

## 2023-10-07 DIAGNOSIS — N6313 Unspecified lump in the right breast, lower outer quadrant: Secondary | ICD-10-CM | POA: Diagnosis not present

## 2023-10-07 DIAGNOSIS — N641 Fat necrosis of breast: Secondary | ICD-10-CM | POA: Diagnosis not present

## 2023-10-07 HISTORY — PX: BREAST BIOPSY: SHX20

## 2023-10-08 LAB — SURGICAL PATHOLOGY

## 2023-10-11 ENCOUNTER — Other Ambulatory Visit: Payer: Self-pay | Admitting: Hematology

## 2023-10-11 ENCOUNTER — Other Ambulatory Visit: Payer: Self-pay | Admitting: Cardiology

## 2023-10-16 ENCOUNTER — Other Ambulatory Visit: Payer: Self-pay

## 2023-10-19 ENCOUNTER — Inpatient Hospital Stay: Payer: Medicare Other

## 2023-10-19 ENCOUNTER — Inpatient Hospital Stay: Payer: Medicare Other | Attending: Hematology

## 2023-10-19 VITALS — BP 141/73 | HR 61 | Temp 97.0°F | Resp 18 | Wt 157.5 lb

## 2023-10-19 DIAGNOSIS — Z5112 Encounter for antineoplastic immunotherapy: Secondary | ICD-10-CM | POA: Diagnosis not present

## 2023-10-19 DIAGNOSIS — C182 Malignant neoplasm of ascending colon: Secondary | ICD-10-CM

## 2023-10-19 DIAGNOSIS — R911 Solitary pulmonary nodule: Secondary | ICD-10-CM | POA: Insufficient documentation

## 2023-10-19 DIAGNOSIS — C773 Secondary and unspecified malignant neoplasm of axilla and upper limb lymph nodes: Secondary | ICD-10-CM | POA: Diagnosis not present

## 2023-10-19 DIAGNOSIS — Z170 Estrogen receptor positive status [ER+]: Secondary | ICD-10-CM | POA: Insufficient documentation

## 2023-10-19 DIAGNOSIS — Z79620 Long term (current) use of immunosuppressive biologic: Secondary | ICD-10-CM | POA: Diagnosis not present

## 2023-10-19 DIAGNOSIS — C679 Malignant neoplasm of bladder, unspecified: Secondary | ICD-10-CM | POA: Insufficient documentation

## 2023-10-19 DIAGNOSIS — Z79811 Long term (current) use of aromatase inhibitors: Secondary | ICD-10-CM | POA: Diagnosis not present

## 2023-10-19 DIAGNOSIS — C50911 Malignant neoplasm of unspecified site of right female breast: Secondary | ICD-10-CM | POA: Insufficient documentation

## 2023-10-19 DIAGNOSIS — C642 Malignant neoplasm of left kidney, except renal pelvis: Secondary | ICD-10-CM

## 2023-10-19 LAB — CBC WITH DIFFERENTIAL/PLATELET
Abs Immature Granulocytes: 0.02 10*3/uL (ref 0.00–0.07)
Basophils Absolute: 0.1 10*3/uL (ref 0.0–0.1)
Basophils Relative: 1 %
Eosinophils Absolute: 0.2 10*3/uL (ref 0.0–0.5)
Eosinophils Relative: 3 %
HCT: 40.6 % (ref 36.0–46.0)
Hemoglobin: 12.6 g/dL (ref 12.0–15.0)
Immature Granulocytes: 0 %
Lymphocytes Relative: 23 %
Lymphs Abs: 1.1 10*3/uL (ref 0.7–4.0)
MCH: 28.3 pg (ref 26.0–34.0)
MCHC: 31 g/dL (ref 30.0–36.0)
MCV: 91 fL (ref 80.0–100.0)
Monocytes Absolute: 0.4 10*3/uL (ref 0.1–1.0)
Monocytes Relative: 7 %
Neutro Abs: 3.3 10*3/uL (ref 1.7–7.7)
Neutrophils Relative %: 66 %
Platelets: 171 10*3/uL (ref 150–400)
RBC: 4.46 MIL/uL (ref 3.87–5.11)
RDW: 14.7 % (ref 11.5–15.5)
WBC: 5.1 10*3/uL (ref 4.0–10.5)
nRBC: 0 % (ref 0.0–0.2)

## 2023-10-19 LAB — COMPREHENSIVE METABOLIC PANEL
ALT: 13 U/L (ref 0–44)
AST: 18 U/L (ref 15–41)
Albumin: 3.9 g/dL (ref 3.5–5.0)
Alkaline Phosphatase: 70 U/L (ref 38–126)
Anion gap: 7 (ref 5–15)
BUN: 22 mg/dL (ref 8–23)
CO2: 23 mmol/L (ref 22–32)
Calcium: 9.6 mg/dL (ref 8.9–10.3)
Chloride: 110 mmol/L (ref 98–111)
Creatinine, Ser: 2.23 mg/dL — ABNORMAL HIGH (ref 0.44–1.00)
GFR, Estimated: 21 mL/min — ABNORMAL LOW (ref >60)
Glucose, Bld: 104 mg/dL — ABNORMAL HIGH (ref 70–99)
Potassium: 4.1 mmol/L (ref 3.5–5.1)
Sodium: 140 mmol/L (ref 135–145)
Total Bilirubin: 1 mg/dL (ref <1.2)
Total Protein: 6.8 g/dL (ref 6.5–8.1)

## 2023-10-19 LAB — MAGNESIUM: Magnesium: 1.9 mg/dL (ref 1.7–2.4)

## 2023-10-19 LAB — TSH: TSH: 4.551 u[IU]/mL — ABNORMAL HIGH (ref 0.350–4.500)

## 2023-10-19 MED ORDER — HEPARIN SOD (PORK) LOCK FLUSH 100 UNIT/ML IV SOLN
500.0000 [IU] | Freq: Once | INTRAVENOUS | Status: AC | PRN
Start: 2023-10-19 — End: 2023-10-19
  Administered 2023-10-19: 500 [IU]

## 2023-10-19 MED ORDER — SODIUM CHLORIDE 0.9% FLUSH
10.0000 mL | INTRAVENOUS | Status: DC | PRN
  Administered 2023-10-19: 10 mL

## 2023-10-19 MED ORDER — SODIUM CHLORIDE 0.9 % IV SOLN
480.0000 mg | Freq: Once | INTRAVENOUS | Status: AC
  Administered 2023-10-19: 480 mg via INTRAVENOUS
  Filled 2023-10-19: qty 48

## 2023-10-19 MED ORDER — SODIUM CHLORIDE 0.9 % IV SOLN: Freq: Once | INTRAVENOUS | Status: AC

## 2023-10-19 MED ORDER — SODIUM CHLORIDE 0.9% FLUSH
10.0000 mL | Freq: Once | INTRAVENOUS | Status: AC
  Administered 2023-10-19: 10 mL via INTRAVENOUS

## 2023-10-19 NOTE — Patient Instructions (Signed)
[facility] - A DEPT OF [facility]  Discharge Instructions: Thank you for choosing [facility] to provide your oncology and hematology care.  If you have a lab appointment with the Cancer Center - please note that after April 8th, 2024, all labs will be drawn in the cancer center.  You do not have to check in or register with the main entrance as you have in the past but will complete your check-in in the cancer center.  Wear comfortable clothing and clothing appropriate for easy access to any Portacath or PICC line.   We strive to give you quality time with your provider. You may need to reschedule your appointment if you arrive late (15 or more minutes).  Arriving late affects you and other patients whose appointments are after yours.  Also, if you miss three or more appointments without notifying the office, you may be dismissed from the clinic at the provider's discretion.      For prescription refill requests, have your pharmacy contact our office and allow 72 hours for refills to be completed.    Today you received the following chemotherapy and/or immunotherapy agents Opdivo, return as scheduled.   To help prevent nausea and vomiting after your treatment, we encourage you to take your nausea medication as directed.  BELOW ARE SYMPTOMS THAT SHOULD BE REPORTED IMMEDIATELY: *FEVER GREATER THAN 100.4 F (38 C) OR HIGHER *CHILLS OR SWEATING *NAUSEA AND VOMITING THAT IS NOT CONTROLLED WITH YOUR NAUSEA MEDICATION *UNUSUAL SHORTNESS OF BREATH *UNUSUAL BRUISING OR BLEEDING *URINARY PROBLEMS (pain or burning when urinating, or frequent urination) *BOWEL PROBLEMS (unusual diarrhea, constipation, pain near the anus) TENDERNESS IN MOUTH AND THROAT WITH OR WITHOUT PRESENCE OF ULCERS (sore throat, sores in mouth, or a toothache) UNUSUAL RASH, SWELLING OR PAIN  UNUSUAL VAGINAL DISCHARGE OR ITCHING   Items with * indicate a potential emergency and  should be followed up as soon as possible or go to the Emergency Department if any problems should occur.  Please show the CHEMOTHERAPY ALERT CARD or IMMUNOTHERAPY ALERT CARD at check-in to the Emergency Department and triage nurse.  Should you have questions after your visit or need to cancel or reschedule your appointment, please contact  [facility] - A DEPT OF [facility] [phone]  and follow the prompts.  Office hours are 8:00 a.m. to 4:30 p.m. Monday - Friday. Please note that voicemails left after 4:00 p.m. may not be returned until the following business day.  We are closed weekends and major holidays. You have access to a nurse at all times for urgent questions. Please call the main number to the clinic [phone] and follow the prompts.  For any non-urgent questions, you may also contact your provider using MyChart. We now offer e-Visits for anyone 21 and older to request care online for non-urgent symptoms. For details visit [email or web address].   Also download the MyChart app! Go to the app store, search "MyChart", open the app, select [place], and log in with your MyChart username and password.

## 2023-10-19 NOTE — Progress Notes (Signed)
Patient tolerated therapy with no complaints voiced.  Side effects with management reviewed with understanding verbalized.  Port site clean and dry with no bruising or swelling noted at site.  Good blood return noted before and after administration of therapy.  Band aid applied.  Patient left in satisfactory condition with VSS and no s/s of distress noted.  

## 2023-10-20 LAB — T4: T4, Total: 5.9 ug/dL (ref 4.5–12.0)

## 2023-10-20 LAB — CEA: CEA: 10.5 ng/mL — ABNORMAL HIGH (ref 0.0–4.7)

## 2023-10-23 ENCOUNTER — Other Ambulatory Visit: Payer: Self-pay

## 2023-10-26 ENCOUNTER — Other Ambulatory Visit: Payer: Self-pay | Admitting: *Deleted

## 2023-10-26 MED ORDER — AMLODIPINE BESYLATE 5 MG PO TABS: 5.0000 mg | ORAL_TABLET | Freq: Every day | ORAL | 2 refills | Status: DC

## 2023-11-02 ENCOUNTER — Other Ambulatory Visit: Payer: Self-pay

## 2023-11-02 DIAGNOSIS — N183 Chronic kidney disease, stage 3 unspecified: Secondary | ICD-10-CM

## 2023-11-04 ENCOUNTER — Other Ambulatory Visit: Payer: Self-pay | Admitting: Cardiology

## 2023-11-06 ENCOUNTER — Other Ambulatory Visit: Payer: Self-pay

## 2023-11-09 ENCOUNTER — Ambulatory Visit (HOSPITAL_COMMUNITY)
Admission: RE | Admit: 2023-11-09 | Discharge: 2023-11-09 | Disposition: A | Discharge status: Home / Self Care | Payer: Medicare Other | Source: Ambulatory Visit | Attending: Hematology | Admitting: Hematology

## 2023-11-09 DIAGNOSIS — C642 Malignant neoplasm of left kidney, except renal pelvis: Secondary | ICD-10-CM | POA: Diagnosis not present

## 2023-11-09 DIAGNOSIS — I7143 Infrarenal abdominal aortic aneurysm, without rupture: Secondary | ICD-10-CM | POA: Diagnosis not present

## 2023-11-09 DIAGNOSIS — Z905 Acquired absence of kidney: Secondary | ICD-10-CM | POA: Diagnosis not present

## 2023-11-09 DIAGNOSIS — C50911 Malignant neoplasm of unspecified site of right female breast: Secondary | ICD-10-CM | POA: Diagnosis not present

## 2023-11-09 DIAGNOSIS — C189 Malignant neoplasm of colon, unspecified: Secondary | ICD-10-CM | POA: Diagnosis not present

## 2023-11-10 ENCOUNTER — Other Ambulatory Visit: Payer: Self-pay | Admitting: Hematology

## 2023-11-15 ENCOUNTER — Inpatient Hospital Stay: Payer: Medicare Other

## 2023-11-15 ENCOUNTER — Inpatient Hospital Stay: Payer: Medicare Other | Attending: Hematology | Admitting: Hematology

## 2023-11-15 VITALS — BP 144/59 | HR 50 | Temp 97.7°F | Resp 17

## 2023-11-15 DIAGNOSIS — C642 Malignant neoplasm of left kidney, except renal pelvis: Secondary | ICD-10-CM

## 2023-11-15 DIAGNOSIS — Z5112 Encounter for antineoplastic immunotherapy: Secondary | ICD-10-CM | POA: Diagnosis not present

## 2023-11-15 DIAGNOSIS — Z1721 Progesterone receptor positive status: Secondary | ICD-10-CM | POA: Diagnosis not present

## 2023-11-15 DIAGNOSIS — C182 Malignant neoplasm of ascending colon: Secondary | ICD-10-CM | POA: Diagnosis not present

## 2023-11-15 DIAGNOSIS — Z79620 Long term (current) use of immunosuppressive biologic: Secondary | ICD-10-CM | POA: Insufficient documentation

## 2023-11-15 DIAGNOSIS — C50911 Malignant neoplasm of unspecified site of right female breast: Secondary | ICD-10-CM | POA: Diagnosis not present

## 2023-11-15 DIAGNOSIS — Z170 Estrogen receptor positive status [ER+]: Secondary | ICD-10-CM | POA: Diagnosis not present

## 2023-11-15 DIAGNOSIS — R911 Solitary pulmonary nodule: Secondary | ICD-10-CM | POA: Diagnosis not present

## 2023-11-15 DIAGNOSIS — C652 Malignant neoplasm of left renal pelvis: Secondary | ICD-10-CM | POA: Insufficient documentation

## 2023-11-15 DIAGNOSIS — Z79811 Long term (current) use of aromatase inhibitors: Secondary | ICD-10-CM | POA: Diagnosis not present

## 2023-11-15 DIAGNOSIS — C773 Secondary and unspecified malignant neoplasm of axilla and upper limb lymph nodes: Secondary | ICD-10-CM | POA: Diagnosis not present

## 2023-11-15 DIAGNOSIS — E039 Hypothyroidism, unspecified: Secondary | ICD-10-CM | POA: Insufficient documentation

## 2023-11-15 LAB — COMPREHENSIVE METABOLIC PANEL
ALT: 11 U/L (ref 0–44)
AST: 15 U/L (ref 15–41)
Albumin: 3.8 g/dL (ref 3.5–5.0)
Alkaline Phosphatase: 77 U/L (ref 38–126)
Anion gap: 8 (ref 5–15)
BUN: 26 mg/dL — ABNORMAL HIGH (ref 8–23)
CO2: 22 mmol/L (ref 22–32)
Calcium: 9.3 mg/dL (ref 8.9–10.3)
Chloride: 108 mmol/L (ref 98–111)
Creatinine, Ser: 2.6 mg/dL — ABNORMAL HIGH (ref 0.44–1.00)
GFR, Estimated: 18 mL/min — ABNORMAL LOW (ref >60)
Glucose, Bld: 102 mg/dL — ABNORMAL HIGH (ref 70–99)
Potassium: 3.9 mmol/L (ref 3.5–5.1)
Sodium: 138 mmol/L (ref 135–145)
Total Bilirubin: 0.7 mg/dL (ref <1.2)
Total Protein: 6.6 g/dL (ref 6.5–8.1)

## 2023-11-15 LAB — CBC WITH DIFFERENTIAL/PLATELET
Abs Immature Granulocytes: 0.03 10*3/uL (ref 0.00–0.07)
Basophils Absolute: 0.1 10*3/uL (ref 0.0–0.1)
Basophils Relative: 1 %
Eosinophils Absolute: 0.2 10*3/uL (ref 0.0–0.5)
Eosinophils Relative: 3 %
HCT: 39.4 % (ref 36.0–46.0)
Hemoglobin: 12.7 g/dL (ref 12.0–15.0)
Immature Granulocytes: 1 %
Lymphocytes Relative: 20 %
Lymphs Abs: 1.1 10*3/uL (ref 0.7–4.0)
MCH: 29.3 pg (ref 26.0–34.0)
MCHC: 32.2 g/dL (ref 30.0–36.0)
MCV: 90.8 fL (ref 80.0–100.0)
Monocytes Absolute: 0.4 10*3/uL (ref 0.1–1.0)
Monocytes Relative: 7 %
Neutro Abs: 4 10*3/uL (ref 1.7–7.7)
Neutrophils Relative %: 68 %
Platelets: 167 10*3/uL (ref 150–400)
RBC: 4.34 MIL/uL (ref 3.87–5.11)
RDW: 14.5 % (ref 11.5–15.5)
WBC: 5.7 10*3/uL (ref 4.0–10.5)
nRBC: 0 % (ref 0.0–0.2)

## 2023-11-15 LAB — TSH: TSH: 3.508 u[IU]/mL (ref 0.350–4.500)

## 2023-11-15 LAB — MAGNESIUM: Magnesium: 1.8 mg/dL (ref 1.7–2.4)

## 2023-11-15 MED ORDER — ANASTROZOLE 1 MG PO TABS: 1.0000 mg | ORAL_TABLET | Freq: Every day | ORAL | 3 refills | Status: DC

## 2023-11-15 MED ORDER — SODIUM CHLORIDE 0.9 % IV SOLN: Freq: Once | INTRAVENOUS | Status: AC

## 2023-11-15 MED ORDER — SODIUM CHLORIDE 0.9 % IV SOLN
480.0000 mg | Freq: Once | INTRAVENOUS | Status: AC
  Administered 2023-11-15: 480 mg via INTRAVENOUS
  Filled 2023-11-15: qty 48

## 2023-11-15 MED ORDER — SODIUM CHLORIDE 0.9% FLUSH
10.0000 mL | INTRAVENOUS | Status: DC | PRN
Start: 2023-11-15 — End: 2023-11-15
  Administered 2023-11-15: 10 mL

## 2023-11-15 MED ORDER — HEPARIN SOD (PORK) LOCK FLUSH 100 UNIT/ML IV SOLN
500.0000 [IU] | Freq: Once | INTRAVENOUS | Status: AC | PRN
Start: 2023-11-15 — End: 2023-11-15
  Administered 2023-11-15: 500 [IU]

## 2023-11-15 MED ORDER — SODIUM CHLORIDE 0.9% FLUSH
10.0000 mL | Freq: Once | INTRAVENOUS | Status: AC
  Administered 2023-11-15: 10 mL via INTRAVENOUS

## 2023-11-15 NOTE — Patient Instructions (Signed)
[facility] at [facility] Discharge Instructions   You were seen and examined today by Dr. Ellin Saba.  He reviewed the results of your lab work which are normal/stable.   He reviewed the results of your CT scan which is mostly stable. There is one spot on the lung that has slightly grown in size (by 2 millimeters) since the previous scan. This is nothing to be concerned about at this time. We will continue to monitor this.   We will proceed with your treatment today.   Return as scheduled.    Thank you for choosing [facility] at [facility] to provide your oncology and hematology care.  To afford each patient quality time with our provider, please arrive at least 15 minutes before your scheduled appointment time.   If you have a lab appointment with the Cancer Center please come in thru the Main Entrance and check in at the main information desk.  You need to re-schedule your appointment should you arrive 10 or more minutes late.  We strive to give you quality time with our providers, and arriving late affects you and other patients whose appointments are after yours.  Also, if you no show three or more times for appointments you may be dismissed from the clinic at the providers discretion.     Again, thank you for choosing [facility].  Our hope is that these requests will decrease the amount of time that you wait before being seen by our physicians.       _____________________________________________________________  Should you have questions after your visit to [facility], please contact our office at [phone] and follow the prompts.  Our office hours are 8:00 a.m. and 4:30 p.m. Monday - Friday.  Please note that voicemails left after 4:00 p.m. may not be returned until the following business day.  We are closed weekends and major holidays.  You do have access to a nurse 24-7, just call the main number to the  clinic [phone] and do not press any options, hold on the line and a nurse will answer the phone.    For prescription refill requests, have your pharmacy contact our office and allow 72 hours.    Due to Covid, you will need to wear a mask upon entering the hospital. If you do not have a mask, a mask will be given to you at the Main Entrance upon arrival. For doctor visits, patients may have 1 support person age 66 or older with them. For treatment visits, patients can not have anyone with them due to social distancing guidelines and our immunocompromised population.

## 2023-11-15 NOTE — Progress Notes (Signed)
Patient has been examined by Dr. Katragadda. Vital signs and labs have been reviewed by MD - ANC, Creatinine, LFTs, hemoglobin, and platelets are within treatment parameters per M.D. - pt may proceed with treatment.  Primary RN and pharmacy notified.  

## 2023-11-15 NOTE — Progress Notes (Signed)
[facility] [address] [place] [address]    Clinic Day:  11/15/2023  Referring physician: Tally Joe, MD  Patient Care Team: Tally Joe, MD as PCP - General (Family Medicine) Jake Bathe, MD as PCP - Cardiology (Cardiology) Karie Soda, MD as Consulting Physician (General Surgery) Jake Bathe, MD as Consulting Physician (Cardiology) Mansouraty, Netty Starring., MD as Consulting Physician (Gastroenterology) Mickie Bail, RN as Oncology Nurse Navigator Doreatha Massed, MD as Consulting Physician (Medical Oncology)   ASSESSMENT & PLAN:   Assessment: 1.  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  Right hemicolectomy on 10/11/2019, grade 3 adenocarcinoma, negative margins, positive LVSI, 2 tumor deposits, 0/15 lymph nodes positive, PT3PN1C. 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  MLH1 hyper methylation present. -PET scan in December 2020 showed lymph node in the right axillary region.  Biopsy consistent with metastatic colon cancer. -Last CEA was 10.7 on 11/22/2019. -Right axillary lymph node excision on 04/02/2020 consistent with metastatic carcinoma from colon cancer. -Pembrolizumab started on 05/14/2020. -PET scan on 08/05/2020 shows complete resolution of lymphadenopathy.  No evidence of new areas of uptake.  Mild vague residual hypermetabolism in the right axilla without soft tissue mass. -PET scan on 12/23/2020 with no evidence of recurrence. Rande Lawman held since we started opdivo on 08/27/2022   2.  Stage I (PT1CPN0) right Breast, Grade 2 Invasive Lobular Carcinoma: -MRI of the breast on 12/25/2019 showed 1 cm mass behind the right areola with a suspicious mass in the left breast. -Right breast lumpectomy on 04/02/2020 shows invasive lobular carcinoma, grade 2, 1.2 cm.  Resection margins are negative.  Negative LVSI.  2 sentinel lymph nodes  were negative for carcinoma.  ER/PR 100% positive, HER-2 negative, Ki-67 10%.   3.  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 - Based on her renal function, she is not a candidate for cisplatin based chemotherapy.  Nivolumab was recommended for 1 year. - PET scan (08/06/2022): No evidence of metastatic disease. - Opdivo for 1 year started on 08/27/2022    Plan: 1.  Stage IV colon cancer to the right axillary lymph node: - She is tolerating Opdivo reasonably well. - We reviewed CT CAP from 11/09/2023: 9 mm subpleural nodule in the left lower lobe, increased from 7 mm on scan from June.  No new lesions were seen. - She reported her heart rate in the 50-60 range.  She is on metoprolol 25 mg twice daily.  She is asymptomatic.  She has cut back to once daily metoprolol.  She was recommended to contact her cardiologist. - Reviewed labs today: Normal LFTs.  Creatinine 2.6 and stable.  CBC grossly normal.  CEA stable at 10.5. - Continue Opdivo every 4 weeks.  Will plan on repeating CT imaging in 6 months.  If the left lower lobe nodule continues to grow, will consider SBRT at that time. - RTC 12 weeks for follow-up for toxicity assessment.   2.  Right breast invasive lobular carcinoma, grade 2: - Continue anastrozole.  She is tolerating well. - She had an abnormal mammogram on 09/28/2023.  She recently had right breast biopsy on 10/07/2023: Benign breast tissue with fat necrosis.   3.  Left lung pulmonary nodule: - CT CAP on 11/09/2023 showed left lower lobe lung nodule measuring 9 mm, previously 7 mm in June 2024  and 4 mm in July 2023. - Will follow-up on repeat imaging in 6 months.   4.  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  She has completed 1 year of adjuvant nivolumab.   5.  Hypothyroidism: - Continue Synthroid 50 mcg daily.  TSH is 4.55.    No orders of the defined types were placed in this encounter.     I,Katie Daubenspeck,acting as a Neurosurgeon for Doreatha Massed, MD.,have documented all relevant documentation on the behalf of Doreatha Massed, MD,as directed by  Doreatha Massed, MD while in the presence of Doreatha Massed, MD.   I, Doreatha Massed MD, have reviewed the above documentation for accuracy and completeness, and I agree with the above.   Doreatha Massed, MD   12/23/20242:32 PM  CHIEF COMPLAINT:   Diagnosis: stage I right breast cancer and colon cancer and left kidney urothelial carcinoma    Cancer Staging  Cancer of ascending colon s/p robotic proximal colectomy 10/11/2019 Staging form: Colon and Rectum, AJCC 8th Edition - Clinical stage from 10/23/2019: Stage IVA (cT3, cN1c, cM1a) - Signed by Doreatha Massed, MD on 12/14/2019  Malignant neoplasm of right female breast [facility]) Staging form: Breast, AJCC 8th Edition - Clinical stage from 04/23/2020: Stage IA (cT1c, cN0(sn), cM0, G2, ER+, PR+, HER2-) - Signed by Doreatha Massed, MD on 04/23/2020  Urothelial carcinoma of kidney, left [facility]) Staging form: Kidney, AJCC 8th Edition - Clinical stage from 08/18/2022: Stage III (cT3, cN0, cM0) - Unsigned    Prior Therapy: 1. Robotic proximal colectomy on 10/11/2019. 2. Right breast lumpectomy and SLNB on 04/02/2020 3.  Left radical nephro ureterectomy, lymphadenectomy, bladder cuff excision on 07/13/2022  Current Therapy:  Opdivo every 4 weeks    HISTORY OF PRESENT ILLNESS:   Oncology History  Cancer of ascending colon s/p robotic proximal colectomy  10/11/2019  10/11/2019 Initial Diagnosis   Cancer of ascending colon s/p robotic proximal colectomy 10/11/2019   10/23/2019 Cancer Staging   Staging form: Colon and Rectum, AJCC 8th Edition - Clinical stage from 10/23/2019: Stage IVA (cT3, cN1c, cM1a) - Signed by Doreatha Massed, MD on 12/14/2019   05/14/2020 - 06/26/2022 Chemotherapy   Patient is on Treatment Plan : COLORECTAL Pembrolizumab q21d     05/14/2020 - 06/26/2022 Chemotherapy   Patient is on Treatment Plan : COLORECTAL Pembrolizumab (200) q21d     08/27/2022 -  Chemotherapy   Patient is on Treatment Plan : COLORECTAL Nivolumab (480) q28d     Malignant neoplasm of right female breast (HCC)  04/23/2020 Initial Diagnosis   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 (HCC)   04/23/2020 Cancer Staging   Staging form: Breast, AJCC 8th Edition - Clinical stage from 04/23/2020: Stage IA (cT1c, cN0(sn), cM0, G2, ER+, PR+, HER2-) - Signed by Doreatha Massed, MD on 04/23/2020   Urothelial carcinoma of kidney, left (HCC)  08/18/2022 Initial Diagnosis   Urothelial carcinoma of kidney, left (HCC)   08/27/2022 -  Chemotherapy   Patient is on Treatment Plan : COLORECTAL Nivolumab (480) q28d        INTERVAL HISTORY:   Jessica Taylor is a 82 y.o. female presenting to clinic today for follow up of stage I  right breast cancer and colon cancer and left kidney urothelial carcinoma. She was last seen by me on 08/24/23.  Since her last visit, she underwent restaging CT C/A/P on 11/09/23 showing: 9 mm subpleural nodule in LLL, mildly progressive; no evidence of metastatic disease in abdomen/pelvis.  Today, she states that she is doing well overall. Her appetite level is at 100%. Her energy level is at 25%.  PAST MEDICAL HISTORY:   Past Medical History: Past Medical History:  Diagnosis Date   Abdominal aortic aneurysm (HCC)    Acid reflux    Anemia    Arthritis    knees , R shoulder - tx /w injection - 11/2014   Basal cell carcinoma  (BCC) of dorsum of nose 2010   Resolved   Cancer (HCC)    basal cell on nose   Colon cancer (HCC)    Coronary artery disease    Hypercholesteremia    Hypertension    Hypothyroidism    Lt Acute pyelonephritis 09/11/2018   MI, old 2000   Peripheral arterial disease (HCC)    hhigh-grade ostial bilateral calcified iliac stenosis with claudication   Tobacco abuse    Vertigo    when lays on left side.    Surgical History: Past Surgical History:  Procedure Laterality Date   BREAST BIOPSY Right 2021   Invasive lobular carcinoma/ Metastatic carcinoma   BREAST BIOPSY Left 2021   Isolated microscopic foci of atypical ductal hyperplasia   BREAST BIOPSY Right 10/07/2023   MM RT BREAST BX W LOC DEV 1ST LESION IMAGE BX SPEC STEREO GUIDE 10/07/2023 [facility]   BREAST BIOPSY Right 10/07/2023   MM RT BREAST BX W LOC DEV EA AD LESION IMG BX SPEC STEREO GUIDE 10/07/2023 [facility]   BREAST LUMPECTOMY WITH RADIOACTIVE SEED AND SENTINEL LYMPH NODE BIOPSY Bilateral 04/02/2020   Procedure: BILATERAL BREAST LUMPECTOMY WITH RADIOACTIVE SEED AND RIGHT SENTINEL LYMPH NODE BIOPSY AND RIGHT TARGETED AXILLARY LYMPH NODE BIOPSY;  Surgeon: Harriette Bouillon, MD;  Location: [facility];  Service: General;  Laterality: Bilateral;   CARDIAC CATHETERIZATION  5 stents   CARDIAC CATHETERIZATION N/A 01/20/2016   Procedure: Left Heart Cath and Coronary Angiography;  Surgeon: Peter M Swaziland, MD;  Location: [facility];  Service: Cardiovascular;  Laterality: N/A;   CATARACT EXTRACTION W/PHACO Left 05/24/2014   Procedure: CATARACT EXTRACTION PHACO AND INTRAOCULAR LENS PLACEMENT (IOC);  Surgeon: Gemma Payor, MD;  Location: [facility];  Service: Ophthalmology;  Laterality: Left;  CDE:  9.30   CATARACT EXTRACTION W/PHACO Right 06/18/2014   Procedure: CATARACT EXTRACTION PHACO AND INTRAOCULAR LENS PLACEMENT RIGHT EYE CDE=10.84;  Surgeon: Gemma Payor, MD;  Location: [facility];  Service: Ophthalmology;   Laterality: Right;   CORONARY ARTERY BYPASS GRAFT N/A 01/24/2016   Procedure: CORONARY ARTERY BYPASS GRAFTING (CABG);  Surgeon: Alleen Borne, MD;  Location: [facility];  Service: Open Heart Surgery;  Laterality: N/A;   CORONARY STENT PLACEMENT     CORONARY STENT PLACEMENT  03/06/2015   CFX DES   CYSTOSCOPY/URETEROSCOPY/HOLMIUM LASER/STENT PLACEMENT Left 09/12/2018   Procedure: CYSTOSCOPY/URETEROSCOPY/STENT PLACEMENT;  Surgeon: Rene Paci, MD;  Location: [facility];  Service: Urology;  Laterality: Left;   EYE SURGERY     LEFT HEART CATH AND CORS/GRAFTS ANGIOGRAPHY N/A 09/12/2019   Procedure: LEFT HEART CATH AND CORS/GRAFTS ANGIOGRAPHY;  Surgeon: Lennette Bihari, MD;  Location: [facility];  Service: Cardiovascular;  Laterality: N/A;   LEFT HEART CATHETERIZATION WITH CORONARY ANGIOGRAM N/A 03/06/2015  Procedure: LEFT HEART CATHETERIZATION WITH CORONARY ANGIOGRAM;  Surgeon: Runell Gess, MD;  Location: [facility] LAB;  Service: Cardiovascular;  Laterality: N/A;   PARTIAL KNEE ARTHROPLASTY Right 02/04/2015   Procedure: UNICOMPARTMENTAL KNEE;  Surgeon: Jodi Geralds, MD;  Location: [facility];  Service: Orthopedics;  Laterality: Right;   PERIPHERAL VASCULAR CATHETERIZATION Bilateral 05/13/2015   Procedure: Lower Extremity Angiography;  Surgeon: Runell Gess, MD;  Location: [facility] INVASIVE CV LAB;  Service: Cardiovascular;  Laterality: Bilateral;   PERIPHERAL VASCULAR CATHETERIZATION N/A 05/13/2015   Procedure: Abdominal Aortogram;  Surgeon: Runell Gess, MD;  Location: [facility];  Service: Cardiovascular;  Laterality: N/A;   PERIPHERAL VASCULAR CATHETERIZATION Bilateral 06/27/2015   Procedure: Peripheral Vascular Intervention;  Surgeon: Runell Gess, MD;  Location: [facility] INVASIVE CV LAB;  Service: Cardiovascular;  Laterality: Bilateral;  ILIACS   PERIPHERAL VASCULAR CATHETERIZATION Bilateral 06/27/2015   Procedure: Peripheral Vascular Atherectomy;  Surgeon: Runell Gess,  MD;  Location: [facility];  Service: Cardiovascular;  Laterality: Bilateral;   PORTACATH PLACEMENT Left 05/24/2020   Procedure: INSERTION PORT-A-CATH;  Surgeon: Franky Macho, MD;  Location: [facility];  Service: General;  Laterality: Left;   TEE WITHOUT CARDIOVERSION N/A 01/24/2016   Procedure: TRANSESOPHAGEAL ECHOCARDIOGRAM (TEE);  Surgeon: Alleen Borne, MD;  Location: [facility] OR;  Service: Open Heart Surgery;  Laterality: N/A;   TONSILLECTOMY     age 10   TUBAL LIGATION      Social History: Social History   Socioeconomic History   Marital status: Married    Spouse name: Tommy   Number of children: 4   Years of education: Not on file   Highest education level: Not on file  Occupational History   Occupation: retired    Comment: worked as Tree surgeon for [employer]  Tobacco Use   Smoking status: Former    Current packs/day: 0.00    Average packs/day: 1.5 packs/day for 42.0 years (63.0 ttl pk-yrs)    Types: Cigarettes    Start date: 05/18/1957    Quit date: 05/19/1999    Years since quitting: 24.5   Smokeless tobacco: Never  Vaping Use   Vaping status: Never Used  Substance and Sexual Activity   Alcohol use: No   Drug use: No   Sexual activity: Yes    Birth control/protection: Surgical  Other Topics Concern   Not on file  Social History Narrative   Not on file   Social Drivers of Health   Financial Resource Strain: Medium Risk (02/18/2021)   Overall Financial Resource Strain (CARDIA)    Difficulty of Paying Living Expenses: Somewhat hard  Food Insecurity: No Food Insecurity (02/18/2021)   Hunger Vital Sign    Worried About Running Out of Food in the Last Year: Never true    Ran Out of Food in the Last Year: Never true  Transportation Needs: No Transportation Needs (02/18/2021)   PRAPARE - Administrator, Civil Service (Medical): No    Lack of Transportation (Non-Medical): No  Physical Activity: Inactive (02/18/2021)   Exercise Vital Sign     Days of Exercise per Week: 0 days    Minutes of Exercise per Session: 0 min  Stress: No Stress Concern Present (02/18/2021)   [employer] of Occupational Health - Occupational Stress Questionnaire    Feeling of Stress : Not at all  Social Connections: Moderately Isolated (02/18/2021)   Social Connection and Isolation Panel [NHANES]    Frequency of Communication with Friends  and Family: More than three times a week    Frequency of Social Gatherings with Friends and Family: Three times a week    Attends Religious Services: Never    Active Member of Clubs or Organizations: No    Attends Banker Meetings: Never    Marital Status: Married  Catering manager Violence: Not At Risk (02/18/2021)   Humiliation, Afraid, Rape, and Kick questionnaire    Fear of Current or Ex-Partner: No    Emotionally Abused: No    Physically Abused: No    Sexually Abused: No    Family History: Family History  Problem Relation Age of Onset   Ovarian cancer Mother 25   Cancer Father 42       unsure of which kind, "it was in his glands"   Hypertension Maternal Grandmother    Stroke Maternal Grandfather    Hypertension Son    Brain cancer Sister    Hypertension Daughter    Heart attack Neg Hx    Colon cancer Neg Hx    Esophageal cancer Neg Hx    Inflammatory bowel disease Neg Hx    Liver disease Neg Hx    Pancreatic cancer Neg Hx    Rectal cancer Neg Hx    Stomach cancer Neg Hx     Current Medications:  Current Outpatient Medications:    amLODipine (NORVASC) 5 MG tablet, Take 1 tablet (5 mg total) by mouth daily., Disp: 30 tablet, Rfl: 2   atorvastatin (LIPITOR) 40 MG tablet, TAKE (1) TABLET BY MOUTH ONCE DAILY., Disp: 90 tablet, Rfl: 2   betamethasone dipropionate 0.05 % cream, Apply topically 2 (two) times daily., Disp: 30 g, Rfl: 0   clopidogrel (PLAVIX) 75 MG tablet, Take 1 tablet (75 mg total) by mouth daily., Disp: 90 tablet, Rfl: 3   diclofenac (VOLTAREN) 50 MG EC tablet, Take  50 mg by mouth as needed for moderate pain., Disp: , Rfl:    esomeprazole (NEXIUM) 20 MG capsule, Take 20 mg by mouth daily. , Disp: , Rfl:    HYDROcodone-acetaminophen (NORCO/VICODIN) 5-325 MG tablet, Take 1 tablet by mouth daily., Disp: 15 tablet, Rfl: 0   hydrOXYzine (ATARAX) 25 MG tablet, TAKE (1) TABLET BY MOUTH AT BEDTIME AS NEEDED, Disp: 30 tablet, Rfl: 0   levothyroxine (SYNTHROID) 50 MCG tablet, TAKE 1/2 TABLET BY MOUTH DAILY., Disp: 30 tablet, Rfl: 0   metoprolol tartrate (LOPRESSOR) 25 MG tablet, TAKE (1) TABLET BY MOUTH TWICE DAILY., Disp: 120 tablet, Rfl: 3   nitrofurantoin, macrocrystal-monohydrate, (MACROBID) 100 MG capsule, Take 100 mg by mouth 2 (two) times daily., Disp: , Rfl:    nitroGLYCERIN (NITROSTAT) 0.4 MG SL tablet, DISSOLVE 1 TABLET SUBLINGUALLY AS NEEDED FOR CHEST PAIN, MAY REPEAT EVERY 5 MINUTES. AFTER 3 CALL 911., Disp: 25 tablet, Rfl: 4   nivolumab (OPDIVO) 100 MG/10ML SOLN chemo injection, as directed Intravenous once a month, Disp: , Rfl:    Vitamin D, Ergocalciferol, (DRISDOL) 1.25 MG (50000 UNIT) CAPS capsule, Take 1 capsule (50,000 Units total) by mouth once a week., Disp: 4 capsule, Rfl: 0   anastrozole (ARIMIDEX) 1 MG tablet, Take 1 tablet (1 mg total) by mouth daily., Disp: 90 tablet, Rfl: 3 No current facility-administered medications for this visit.  Facility-Administered Medications Ordered in Other Visits:    heparin lock flush 100 unit/mL, 500 Units, Intracatheter, Once PRN, Doreatha Massed, MD   nivolumab (OPDIVO) 480 mg in sodium chloride 0.9 % 100 mL chemo infusion, 480 mg, Intravenous, Once, Doreatha Massed,  MD   sodium chloride flush (NS) 0.9 % injection 10 mL, 10 mL, Intracatheter, PRN, Doreatha Massed, MD, 10 mL at 11/26/22 1631   sodium chloride flush (NS) 0.9 % injection 10 mL, 10 mL, Intracatheter, PRN, Doreatha Massed, MD, 10 mL at 03/02/23 1120   sodium chloride flush (NS) 0.9 % injection 10 mL, 10 mL, Intracatheter, PRN,  Doreatha Massed, MD   Allergies: Allergies  Allergen Reactions   Crab [Shellfish Allergy] Nausea And Vomiting    Throws up violently   Ezetimibe Diarrhea   Keflex [Cephalexin]     Caused sores   Other Nausea And Vomiting    Shrimp   Penicillins Diarrhea   Shrimp Extract     Other Reaction(s): GI Intolerance    REVIEW OF SYSTEMS:   Review of Systems  Constitutional:  Negative for chills, fatigue and fever.  HENT:   Negative for lump/mass, mouth sores, nosebleeds, sore throat and trouble swallowing.   Eyes:  Negative for eye problems.  Respiratory:  Negative for cough and shortness of breath.   Cardiovascular:  Positive for palpitations. Negative for chest pain and leg swelling.  Gastrointestinal:  Positive for diarrhea. Negative for abdominal pain, constipation, nausea and vomiting.  Genitourinary:  Negative for bladder incontinence, difficulty urinating, dysuria, frequency, hematuria and nocturia.   Musculoskeletal:  Positive for arthralgias. Negative for back pain, flank pain, myalgias and neck pain.  Skin:  Negative for itching and rash.  Neurological:  Negative for dizziness, headaches and numbness.  Hematological:  Does not bruise/bleed easily.  Psychiatric/Behavioral:  Negative for depression, sleep disturbance and suicidal ideas. The patient is not nervous/anxious.   All other systems reviewed and are negative.    VITALS:   There were no vitals taken for this visit.  Wt Readings from Last 3 Encounters:  11/15/23 159 lb (72.1 kg)  10/19/23 157 lb 8 oz (71.4 kg)  10/19/23 157 lb 9.6 oz (71.5 kg)    There is no height or weight on file to calculate BMI.  Performance status (ECOG): 1 - Symptomatic but completely ambulatory  PHYSICAL EXAM:   Physical Exam Vitals and nursing note reviewed. Exam conducted with a chaperone present.  Constitutional:      Appearance: Normal appearance.  Cardiovascular:     Rate and Rhythm: Normal rate and regular rhythm.      Pulses: Normal pulses.     Heart sounds: Normal heart sounds.  Pulmonary:     Effort: Pulmonary effort is normal.     Breath sounds: Normal breath sounds.  Abdominal:     Palpations: Abdomen is soft. There is no hepatomegaly, splenomegaly or mass.     Tenderness: There is no abdominal tenderness.  Musculoskeletal:     Right lower leg: No edema.     Left lower leg: No edema.  Lymphadenopathy:     Cervical: No cervical adenopathy.     Right cervical: No superficial, deep or posterior cervical adenopathy.    Left cervical: No superficial, deep or posterior cervical adenopathy.     Upper Body:     Right upper body: No supraclavicular or axillary adenopathy.     Left upper body: No supraclavicular or axillary adenopathy.  Neurological:     General: No focal deficit present.     Mental Status: She is alert and oriented to person, place, and time.  Psychiatric:        Mood and Affect: Mood normal.        Behavior: Behavior normal.  LABS:      Latest Ref Rng & Units 11/15/2023   12:32 PM 10/19/2023   12:32 PM 09/21/2023   12:37 PM  CBC  WBC 4.0 - 10.5 K/uL 5.7  5.1  4.6   Hemoglobin 12.0 - 15.0 g/dL 56.4  33.2  95.1   Hematocrit 36.0 - 46.0 % 39.4  40.6  40.7   Platelets 150 - 400 K/uL 167  171  163       Latest Ref Rng & Units 11/15/2023   12:32 PM 10/19/2023   12:32 PM 09/21/2023   12:37 PM  CMP  Glucose 70 - 99 mg/dL 884  166  063   BUN 8 - 23 mg/dL 26  22  18    Creatinine 0.44 - 1.00 mg/dL 0.16  0.10  9.32   Sodium 135 - 145 mmol/L 138  140  138   Potassium 3.5 - 5.1 mmol/L 3.9  4.1  3.7   Chloride 98 - 111 mmol/L 108  110  107   CO2 22 - 32 mmol/L 22  23  23    Calcium 8.9 - 10.3 mg/dL 9.3  9.6  9.5   Total Protein 6.5 - 8.1 g/dL 6.6  6.8  6.8   Total Bilirubin <1.2 mg/dL 0.7  1.0  0.8   Alkaline Phos 38 - 126 U/L 77  70  78   AST 15 - 41 U/L 15  18  16    ALT 0 - 44 U/L 11  13  12       Lab Results  Component Value Date   CEA1 10.5 (H) 10/19/2023   CEA  39.5 (H) 08/09/2019   /  CEA  Date Value Ref Range Status  10/19/2023 10.5 (H) 0.0 - 4.7 ng/mL Final    Comment:    (NOTE)                             Nonsmokers          <3.9                             Smokers             <5.6 Roche [employer]ostics Electrochemiluminescence Immunoassay (ECLIA) Values obtained with different assay methods or kits cannot be used interchangeably.  Results cannot be interpreted as absolute evidence of the presence or absence of malignant disease. Performed At: [facility] [address] [place], [place] [id number] Jolene Schimke MD RK:[id number]   08/09/2019 39.5 (H) ng/mL Final    Comment:    Non-Smoker: <2.5 Smoker:     <5.0 . . This test was performed using the Siemens  chemiluminescent method. Values obtained from different assay methods cannot be used interchangeably. CEA levels, regardless of value, should not be interpreted as absolute evidence of the presence or absence of disease. .    No results found for: "PSA1" No results found for: "BTD176" No results found for: "CAN125"  No results found for: "TOTALPROTELP", "ALBUMINELP", "A1GS", "A2GS", "BETS", "BETA2SER", "GAMS", "MSPIKE", "SPEI" Lab Results  Component Value Date   TIBC 357 06/04/2022   TIBC 269 11/22/2019   TIBC 271 10/23/2019   FERRITIN 10 (L) 06/04/2022   FERRITIN 230 11/22/2019   FERRITIN 47 10/23/2019   IRONPCTSAT 9 (L) 06/04/2022   IRONPCTSAT 31 11/22/2019   IRONPCTSAT 11 10/23/2019   No results found for: "LDH"   STUDIES:  CT CHEST ABDOMEN PELVIS WO CONTRAST Result Date: 11/15/2023 CLINICAL DATA:  Multiple prior cancers, including right breast cancer, left renal cell cancer, and colon cancer. EXAM: CT CHEST, ABDOMEN AND PELVIS WITHOUT CONTRAST TECHNIQUE: Multidetector CT imaging of the chest, abdomen and pelvis was performed following the standard protocol without IV contrast. RADIATION DOSE REDUCTION: This exam was performed according to the  departmental dose-optimization program which includes automated exposure control, adjustment of the mA and/or kV according to patient size and/or use of iterative reconstruction technique. COMPARISON:  05/21/2023 FINDINGS: CT CHEST FINDINGS Cardiovascular: Heart is normal in size.  No pericardial effusion. No evidence of thoracic aortic aneurysm. Atherosclerotic calcifications of the aortic arch. Severe three-vessel coronary atherosclerosis. Postsurgical changes related to prior CABG. Mediastinum/Nodes: No suspicious mediastinal lymphadenopathy. Visualized thyroid is unremarkable. Lungs/Pleura: Status post right breast lumpectomy (series 2/image 40). 9 mm subpleural nodule in the left lower lobe (series 3/image 31), previously 7 mm. Mild centrilobular and paraseptal emphysematous changes, upper lung predominant Mild biapical pleural-parenchymal scarring. Mild dependent atelectasis/subpleural reticulation in the bilateral lower lobes. No pleural effusion or pneumothorax. Musculoskeletal: Mild degenerative changes of the lower thoracic spine. Median sternotomy. CT ABDOMEN PELVIS FINDINGS Hepatobiliary: 13 mm anterior right hepatic cyst (series 2/image 83), benign. Gallbladder is unremarkable. No intrahepatic or extrahepatic duct dilatation. Pancreas: Within normal limits. Spleen: Within normal limits. Adrenals/Urinary Tract: Adrenal glands are within normal limits. Status post left nephrectomy. No abnormal soft tissue in the surgical bed. Right kidney is within normal limits. No right renal, ureteral, or bladder calculi. Bladder is within normal limits. Stomach/Bowel: Stomach is notable for a small hiatal hernia. No evidence of bowel obstruction. Status post right hemicolectomy.  Status post appendectomy. Mild colonic diverticulosis, without evidence of diverticulitis. Vascular/Lymphatic: 3.2 cm infrarenal abdominal aortic aneurysm, grossly unchanged. Atherosclerotic calcifications of the abdominal aorta and branch  vessels. No suspicious abdominopelvic lymphadenopathy. Reproductive: Uterus is within normal limits. Bilateral ovaries within normal limits. Other: No abdominopelvic ascites. Musculoskeletal: Mild degenerative changes of the upper lumbar spine. IMPRESSION: Status post right breast lumpectomy, left nephrectomy, and right hemicolectomy. 9 mm subpleural nodule in the left lower lobe, mildly progressive, concerning for metastatic disease. No evidence of metastatic disease in the abdomen/pelvis. 3.2 cm infrarenal abdominal aortic aneurysm, grossly unchanged. Recommend follow-up ultrasound every 3 years. Aortic Atherosclerosis (ICD10-I70.0) and Emphysema (ICD10-J43.9). Electronically Signed   By: Charline Bills M.D.   On: 11/15/2023 00:21

## 2023-11-15 NOTE — Patient Instructions (Signed)
[facility] [place] - A DEPT OF [facility]  Discharge Instructions: Thank you for choosing [facility] to provide your oncology and hematology care.  If you have a lab appointment with the Cancer Center - please note that after April 8th, 2024, all labs will be drawn in the cancer center.  You do not have to check in or register with the main entrance as you have in the past but will complete your check-in in the cancer center.  Wear comfortable clothing and clothing appropriate for easy access to any Portacath or PICC line.   We strive to give you quality time with your provider. You may need to reschedule your appointment if you arrive late (15 or more minutes).  Arriving late affects you and other patients whose appointments are after yours.  Also, if you miss three or more appointments without notifying the office, you may be dismissed from the clinic at the provider's discretion.      For prescription refill requests, have your pharmacy contact our office and allow 72 hours for refills to be completed.    Today you received the following chemotherapy and/or immunotherapy agents Opdivo.Nivolumab Injection What is this medication? NIVOLUMAB (nye VOL ue mab) treats some types of cancer. It works by helping your immune system slow or stop the spread of cancer cells. It is a monoclonal antibody. This medicine may be used for other purposes; ask your health care provider or pharmacist if you have questions. COMMON BRAND NAME(S): Opdivo What should I tell my care team before I take this medication? They need to know if you have any of these conditions: Allogeneic stem cell transplant (uses someone else's stem cells) Autoimmune diseases, such as Crohn disease, ulcerative colitis, lupus History of chest radiation Nervous system problems, such as Guillain-Barre syndrome or myasthenia gravis Organ transplant An unusual or allergic reaction to nivolumab, other  medications, foods, dyes, or preservatives Pregnant or trying to get pregnant Breast-feeding How should I use this medication? This medication is infused into a vein. It is given in a hospital or clinic setting. A special MedGuide will be given to you before each treatment. Be sure to read this information carefully each time. Talk to your care team about the use of this medication in children. While it may be prescribed for children as young as 12 years for selected conditions, precautions do apply. Overdosage: If you think you have taken too much of this medicine contact a poison control center or emergency room at once. NOTE: This medicine is only for you. Do not share this medicine with others. What if I miss a dose? Keep appointments for follow-up doses. It is important not to miss your dose. Call your care team if you are unable to keep an appointment. What may interact with this medication? Interactions have not been studied. This list may not describe all possible interactions. Give your health care provider a list of all the medicines, herbs, non-prescription drugs, or dietary supplements you use. Also tell them if you smoke, drink alcohol, or use illegal drugs. Some items may interact with your medicine. What should I watch for while using this medication? Your condition will be monitored carefully while you are receiving this medication. You may need blood work while taking this medication. This medication may cause serious skin reactions. They can happen weeks to months after starting the medication. Contact your care team right away if you notice fevers or flu-like symptoms with  a rash. The rash may be red or purple and then turn into blisters or peeling of the skin. You may also notice a red rash with swelling of the face, lips, or lymph nodes in your neck or under your arms. Tell your care team right away if you have any change in your eyesight. Talk to your care team if you are  pregnant or think you might be pregnant. A negative pregnancy test is required before starting this medication. A reliable form of contraception is recommended while taking this medication and for 5 months after the last dose. Talk to your care team about effective forms of contraception. Do not breast-feed while taking this medication and for 5 months after the last dose. What side effects may I notice from receiving this medication? Side effects that you should report to your care team as soon as possible: Allergic reactions--skin rash, itching, hives, swelling of the face, lips, tongue, or throat Dry cough, shortness of breath or trouble breathing Eye pain, redness, irritation, or discharge with blurry or decreased vision Heart muscle inflammation--unusual weakness or fatigue, shortness of breath, chest pain, fast or irregular heartbeat, dizziness, swelling of the ankles, feet, or hands Hormone gland problems--headache, sensitivity to light, unusual weakness or fatigue, dizziness, fast or irregular heartbeat, increased sensitivity to cold or heat, excessive sweating, constipation, hair loss, increased thirst or amount of urine, tremors or shaking, irritability Infusion reactions--chest pain, shortness of breath or trouble breathing, feeling faint or lightheaded Kidney injury (glomerulonephritis)--decrease in the amount of urine, red or dark Jaydalyn Demattia urine, foamy or bubbly urine, swelling of the ankles, hands, or feet Liver injury--right upper belly pain, loss of appetite, nausea, light-colored stool, dark yellow or Irven Ingalsbe urine, yellowing skin or eyes, unusual weakness or fatigue Pain, tingling, or numbness in the hands or feet, muscle weakness, change in vision, confusion or trouble speaking, loss of balance or coordination, trouble walking, seizures Rash, fever, and swollen lymph nodes Redness, blistering, peeling, or loosening of the skin, including inside the mouth Sudden or severe stomach pain,  bloody diarrhea, fever, nausea, vomiting Side effects that usually do not require medical attention (report these to your care team if they continue or are bothersome): Bone, joint, or muscle pain Diarrhea Fatigue Loss of appetite Nausea Skin rash This list may not describe all possible side effects. Call your doctor for medical advice about side effects. You may report side effects to FDA at 1-800-FDA-1088. Where should I keep my medication? This medication is given in a hospital or clinic. It will not be stored at home. NOTE: This sheet is a summary. It may not cover all possible information. If you have questions about this medicine, talk to your doctor, pharmacist, or health care provider.  2024 Elsevier/Gold Standard (2022-03-09 00:00:00)       To help prevent nausea and vomiting after your treatment, we encourage you to take your nausea medication as directed.  BELOW ARE SYMPTOMS THAT SHOULD BE REPORTED IMMEDIATELY: *FEVER GREATER THAN 100.4 F (38 C) OR HIGHER *CHILLS OR SWEATING *NAUSEA AND VOMITING THAT IS NOT CONTROLLED WITH YOUR NAUSEA MEDICATION *UNUSUAL SHORTNESS OF BREATH *UNUSUAL BRUISING OR BLEEDING *URINARY PROBLEMS (pain or burning when urinating, or frequent urination) *BOWEL PROBLEMS (unusual diarrhea, constipation, pain near the anus) TENDERNESS IN MOUTH AND THROAT WITH OR WITHOUT PRESENCE OF ULCERS (sore throat, sores in mouth, or a toothache) UNUSUAL RASH, SWELLING OR PAIN  UNUSUAL VAGINAL DISCHARGE OR ITCHING   Items with * indicate a potential emergency and should  be followed up as soon as possible or go to the Emergency Department if any problems should occur.  Please show the CHEMOTHERAPY ALERT CARD or IMMUNOTHERAPY ALERT CARD at check-in to the Emergency Department and triage nurse.  Should you have questions after your visit or need to cancel or reschedule your appointment, please contact [facility] [place] - A DEPT OF [facility] [phone]  and follow the prompts.  Office hours are 8:00 a.m. to 4:30 p.m. Monday - Friday. Please note that voicemails left after 4:00 p.m. may not be returned until the following business day.  We are closed weekends and major holidays. You have access to a nurse at all times for urgent questions. Please call the main number to the clinic [phone] and follow the prompts.  For any non-urgent questions, you may also contact your provider using MyChart. We now offer e-Visits for anyone 46 and older to request care online for non-urgent symptoms. For details visit [email or web address].   Also download the MyChart app! Go to the app store, search "MyChart", open the app, select [place], and log in with your MyChart username and password.

## 2023-11-15 NOTE — Progress Notes (Signed)
Patient presents today for chemotherapy infusion. Patient is in satisfactory condition with no new complaints voiced.  Vital signs are stable.  Labs reviewed by Dr. Ellin Saba during the office visit.  Creatinine today is 2.60.  MD aware.  We will proceed with treatment per MD orders.   Patient tolerated treatment well with no complaints voiced.  Patient left ambulatory in stable condition.  Vital signs stable at discharge.  Follow up as scheduled.

## 2023-11-15 NOTE — Progress Notes (Signed)
Ok to treat with elevated SCr (2.6) per MD.  Richardean Sale, RPH, BCPS, BCOP 11/15/2023 1:43 PM

## 2023-11-16 LAB — T4: T4, Total: 6.3 ug/dL (ref 4.5–12.0)

## 2023-11-16 LAB — CEA: CEA: 11 ng/mL — ABNORMAL HIGH (ref 0.0–4.7)

## 2023-11-17 ENCOUNTER — Other Ambulatory Visit: Payer: Self-pay

## 2023-11-19 ENCOUNTER — Other Ambulatory Visit: Payer: Self-pay

## 2023-11-28 ENCOUNTER — Other Ambulatory Visit: Payer: Self-pay | Admitting: Hematology

## 2023-11-29 ENCOUNTER — Encounter: Payer: Self-pay | Admitting: Hematology

## 2023-11-29 DIAGNOSIS — N184 Chronic kidney disease, stage 4 (severe): Secondary | ICD-10-CM | POA: Diagnosis not present

## 2023-11-29 DIAGNOSIS — I129 Hypertensive chronic kidney disease with stage 1 through stage 4 chronic kidney disease, or unspecified chronic kidney disease: Secondary | ICD-10-CM | POA: Diagnosis not present

## 2023-11-29 DIAGNOSIS — R809 Proteinuria, unspecified: Secondary | ICD-10-CM | POA: Diagnosis not present

## 2023-11-29 DIAGNOSIS — I714 Abdominal aortic aneurysm, without rupture, unspecified: Secondary | ICD-10-CM | POA: Diagnosis not present

## 2023-11-29 NOTE — Progress Notes (Signed)
 Patient name: Jessica Taylor MRN: [id number] DOB: 06-Feb-1941 Sex: female  REASON FOR CONSULT: Access placement  HPI: Jessica Taylor is a 83 y.o. female, with history 3.2 cm AAA, CAD, hypertension, PAD, tobacco abuse, breast cancer, colon cancer, urothelial carcinoma left kidney, stage IV CKD that presents for evaluation of new permanent hemodialysis access.  Patient is right-handed.  No prior access in the past.  Not on dialysis at this time.  She does have a Port-A-Cath in her left chest from prior breast cancer as well as colon cancer.  She tells me she has had one kidney removed.  Past Medical History:  Diagnosis Date   Abdominal aortic aneurysm (HCC)    Acid reflux    Anemia    Arthritis    knees , R shoulder - tx /w injection - 11/2014   Basal cell carcinoma (BCC) of dorsum of nose 2010   Resolved   Cancer (HCC)    basal cell on nose   Colon cancer (HCC)    Coronary artery disease    Hypercholesteremia    Hypertension    Hypothyroidism    Lt Acute pyelonephritis 09/11/2018   MI, old 2000   Peripheral arterial disease (HCC)    hhigh-grade ostial bilateral calcified iliac stenosis with claudication   Tobacco abuse    Vertigo    when lays on left side.    Past Surgical History:  Procedure Laterality Date   BREAST BIOPSY Right 2021   Invasive lobular carcinoma/ Metastatic carcinoma   BREAST BIOPSY Left 2021   Isolated microscopic foci of atypical ductal hyperplasia   BREAST BIOPSY Right 10/07/2023   MM RT BREAST BX W LOC DEV 1ST LESION IMAGE BX SPEC STEREO GUIDE 10/07/2023 [facility]   BREAST BIOPSY Right 10/07/2023   MM RT BREAST BX W LOC DEV EA AD LESION IMG BX SPEC STEREO GUIDE 10/07/2023 [facility]   BREAST LUMPECTOMY WITH RADIOACTIVE SEED AND SENTINEL LYMPH NODE BIOPSY Bilateral 04/02/2020   Procedure: BILATERAL BREAST LUMPECTOMY WITH RADIOACTIVE SEED AND RIGHT SENTINEL LYMPH NODE BIOPSY AND RIGHT TARGETED AXILLARY LYMPH NODE BIOPSY;  Surgeon:  Vanderbilt Ned, MD;  Location: [facility];  Service: General;  Laterality: Bilateral;   CARDIAC CATHETERIZATION  5 stents   CARDIAC CATHETERIZATION N/A 01/20/2016   Procedure: Left Heart Cath and Coronary Angiography;  Surgeon: Peter M Jordan, MD;  Location: [facility];  Service: Cardiovascular;  Laterality: N/A;   CATARACT EXTRACTION W/PHACO Left 05/24/2014   Procedure: CATARACT EXTRACTION PHACO AND INTRAOCULAR LENS PLACEMENT (IOC);  Surgeon: Cherene Mania, MD;  Location: [facility];  Service: Ophthalmology;  Laterality: Left;  CDE:  9.30   CATARACT EXTRACTION W/PHACO Right 06/18/2014   Procedure: CATARACT EXTRACTION PHACO AND INTRAOCULAR LENS PLACEMENT RIGHT EYE CDE=10.84;  Surgeon: Cherene Mania, MD;  Location: [facility];  Service: Ophthalmology;  Laterality: Right;   CORONARY ARTERY BYPASS GRAFT N/A 01/24/2016   Procedure: CORONARY ARTERY BYPASS GRAFTING (CABG);  Surgeon: Dorise MARLA Fellers, MD;  Location: [facility] OR;  Service: Open Heart Surgery;  Laterality: N/A;   CORONARY STENT PLACEMENT     CORONARY STENT PLACEMENT  03/06/2015   CFX DES   CYSTOSCOPY/URETEROSCOPY/HOLMIUM LASER/STENT PLACEMENT Left 09/12/2018   Procedure: CYSTOSCOPY/URETEROSCOPY/STENT PLACEMENT;  Surgeon: Devere Lonni Righter, MD;  Location: [facility];  Service: Urology;  Laterality: Left;   EYE SURGERY     LEFT HEART CATH AND CORS/GRAFTS ANGIOGRAPHY N/A 09/12/2019   Procedure: LEFT HEART CATH AND CORS/GRAFTS ANGIOGRAPHY;  Surgeon:  Burnard Debby LABOR, MD;  Location: [facility] INVASIVE CV LAB;  Service: Cardiovascular;  Laterality: N/A;   LEFT HEART CATHETERIZATION WITH CORONARY ANGIOGRAM N/A 03/06/2015   Procedure: LEFT HEART CATHETERIZATION WITH CORONARY ANGIOGRAM;  Surgeon: Dorn JINNY Lesches, MD;  Location: [facility] CATH LAB;  Service: Cardiovascular;  Laterality: N/A;   PARTIAL KNEE ARTHROPLASTY Right 02/04/2015   Procedure: UNICOMPARTMENTAL KNEE;  Surgeon: Norleen Gavel, MD;  Location: [facility];  Service: Orthopedics;  Laterality:  Right;   PERIPHERAL VASCULAR CATHETERIZATION Bilateral 05/13/2015   Procedure: Lower Extremity Angiography;  Surgeon: Dorn JINNY Lesches, MD;  Location: [facility] INVASIVE CV LAB;  Service: Cardiovascular;  Laterality: Bilateral;   PERIPHERAL VASCULAR CATHETERIZATION N/A 05/13/2015   Procedure: Abdominal Aortogram;  Surgeon: Dorn JINNY Lesches, MD;  Location: [facility];  Service: Cardiovascular;  Laterality: N/A;   PERIPHERAL VASCULAR CATHETERIZATION Bilateral 06/27/2015   Procedure: Peripheral Vascular Intervention;  Surgeon: Dorn JINNY Lesches, MD;  Location: [facility] INVASIVE CV LAB;  Service: Cardiovascular;  Laterality: Bilateral;  ILIACS   PERIPHERAL VASCULAR CATHETERIZATION Bilateral 06/27/2015   Procedure: Peripheral Vascular Atherectomy;  Surgeon: Dorn JINNY Lesches, MD;  Location: [facility];  Service: Cardiovascular;  Laterality: Bilateral;   PORTACATH PLACEMENT Left 05/24/2020   Procedure: INSERTION PORT-A-CATH;  Surgeon: Mavis Anes, MD;  Location: [facility];  Service: General;  Laterality: Left;   TEE WITHOUT CARDIOVERSION N/A 01/24/2016   Procedure: TRANSESOPHAGEAL ECHOCARDIOGRAM (TEE);  Surgeon: Dorise MARLA Fellers, MD;  Location: [facility] OR;  Service: Open Heart Surgery;  Laterality: N/A;   TONSILLECTOMY     age 14   TUBAL LIGATION      Family History  Problem Relation Age of Onset   Ovarian cancer Mother 31   Cancer Father 57       unsure of which kind, it was in his glands   Hypertension Maternal Grandmother    Stroke Maternal Grandfather    Hypertension Son    Brain cancer Sister    Hypertension Daughter    Heart attack Neg Hx    Colon cancer Neg Hx    Esophageal cancer Neg Hx    Inflammatory bowel disease Neg Hx    Liver disease Neg Hx    Pancreatic cancer Neg Hx    Rectal cancer Neg Hx    Stomach cancer Neg Hx     SOCIAL HISTORY: Social History   Socioeconomic History   Marital status: Married    Spouse name: Tommy   Number of children: 4   Years of education: Not  on file   Highest education level: Not on file  Occupational History   Occupation: retired    Comment: worked as tree surgeon for [employer]  Tobacco Use   Smoking status: Former    Current packs/day: 0.00    Average packs/day: 1.5 packs/day for 42.0 years (63.0 ttl pk-yrs)    Types: Cigarettes    Start date: 05/18/1957    Quit date: 05/19/1999    Years since quitting: 24.5   Smokeless tobacco: Never  Vaping Use   Vaping status: Never Used  Substance and Sexual Activity   Alcohol use: No   Drug use: No   Sexual activity: Yes    Birth control/protection: Surgical  Other Topics Concern   Not on file  Social History Narrative   Not on file   Social Drivers of Health   Financial Resource Strain: Medium Risk (02/18/2021)   Overall Financial Resource Strain (CARDIA)    Difficulty of Paying Living Expenses:  Somewhat hard  Food Insecurity: No Food Insecurity (02/18/2021)   Hunger Vital Sign    Worried About Running Out of Food in the Last Year: Never true    Ran Out of Food in the Last Year: Never true  Transportation Needs: No Transportation Needs (02/18/2021)   PRAPARE - Administrator, Civil Service (Medical): No    Lack of Transportation (Non-Medical): No  Physical Activity: Inactive (02/18/2021)   Exercise Vital Sign    Days of Exercise per Week: 0 days    Minutes of Exercise per Session: 0 min  Stress: No Stress Concern Present (02/18/2021)   [employer] of Occupational Health - Occupational Stress Questionnaire    Feeling of Stress : Not at all  Social Connections: Moderately Isolated (02/18/2021)   Social Connection and Isolation Panel [NHANES]    Frequency of Communication with Friends and Family: More than three times a week    Frequency of Social Gatherings with Friends and Family: Three times a week    Attends Religious Services: Never    Active Member of Clubs or Organizations: No    Attends Banker Meetings: Never    Marital  Status: Married  Catering Manager Violence: Not At Risk (02/18/2021)   Humiliation, Afraid, Rape, and Kick questionnaire    Fear of Current or Ex-Partner: No    Emotionally Abused: No    Physically Abused: No    Sexually Abused: No    Allergies  Allergen Reactions   Crab [Shellfish Allergy] Nausea And Vomiting    Throws up violently   Ezetimibe Diarrhea   Keflex  [Cephalexin ]     Caused sores   Other Nausea And Vomiting    Shrimp   Penicillins Diarrhea   Shrimp Extract     Other Reaction(s): GI Intolerance    Current Outpatient Medications  Medication Sig Dispense Refill   amLODipine  (NORVASC ) 5 MG tablet Take 1 tablet (5 mg total) by mouth daily. 30 tablet 2   anastrozole  (ARIMIDEX ) 1 MG tablet Take 1 tablet (1 mg total) by mouth daily. 90 tablet 3   atorvastatin  (LIPITOR ) 40 MG tablet TAKE (1) TABLET BY MOUTH ONCE DAILY. 90 tablet 2   betamethasone  dipropionate 0.05 % cream Apply topically 2 (two) times daily. 30 g 0   clopidogrel  (PLAVIX ) 75 MG tablet Take 1 tablet (75 mg total) by mouth daily. 90 tablet 3   diclofenac (VOLTAREN) 50 MG EC tablet Take 50 mg by mouth as needed for moderate pain.     esomeprazole  (NEXIUM ) 20 MG capsule Take 20 mg by mouth daily.      HYDROcodone -acetaminophen  (NORCO/VICODIN) 5-325 MG tablet Take 1 tablet by mouth daily. 15 tablet 0   hydrOXYzine  (ATARAX ) 25 MG tablet TAKE (1) TABLET BY MOUTH AT BEDTIME AS NEEDED 30 tablet 0   levothyroxine  (SYNTHROID ) 50 MCG tablet TAKE 1/2 TABLET BY MOUTH DAILY. 30 tablet 0   metoprolol  tartrate (LOPRESSOR ) 25 MG tablet TAKE (1) TABLET BY MOUTH TWICE DAILY. 120 tablet 3   nitrofurantoin, macrocrystal-monohydrate, (MACROBID) 100 MG capsule Take 100 mg by mouth 2 (two) times daily.     nitroGLYCERIN  (NITROSTAT ) 0.4 MG SL tablet DISSOLVE 1 TABLET SUBLINGUALLY AS NEEDED FOR CHEST PAIN, MAY REPEAT EVERY 5 MINUTES. AFTER 3 CALL 911. 25 tablet 4   nivolumab  (OPDIVO ) 100 MG/10ML SOLN chemo injection as directed  Intravenous once a month     Vitamin D , Ergocalciferol , (DRISDOL ) 1.25 MG (50000 UNIT) CAPS capsule Take 1 capsule (50,000 Units total)  by mouth once a week. 4 capsule 0   No current facility-administered medications for this visit.   Facility-Administered Medications Ordered in Other Visits  Medication Dose Route Frequency Provider Last Rate Last Admin   sodium chloride  flush (NS) 0.9 % injection 10 mL  10 mL Intracatheter PRN Rogers Hai, MD   10 mL at 11/26/22 1631   sodium chloride  flush (NS) 0.9 % injection 10 mL  10 mL Intracatheter PRN Rogers Hai, MD   10 mL at 03/02/23 1120    REVIEW OF SYSTEMS:  [X]  denotes positive finding, [ ]  denotes negative finding Cardiac  Comments:  Chest pain or chest pressure:    Shortness of breath upon exertion:    Short of breath when lying flat:    Irregular heart rhythm:        Vascular    Pain in calf, thigh, or hip brought on by ambulation:    Pain in feet at night that wakes you up from your sleep:     Blood clot in your veins:    Leg swelling:         Pulmonary    Oxygen at home:    Productive cough:     Wheezing:         Neurologic    Sudden weakness in arms or legs:     Sudden numbness in arms or legs:     Sudden onset of difficulty speaking or slurred speech:    Temporary loss of vision in one eye:     Problems with dizziness:         Gastrointestinal    Blood in stool:     Vomited blood:         Genitourinary    Burning when urinating:     Blood in urine:        Psychiatric    Major depression:         Hematologic    Bleeding problems:    Problems with blood clotting too easily:        Skin    Rashes or ulcers:        Constitutional    Fever or chills:      PHYSICAL EXAM: There were no vitals filed for this visit.  GENERAL: The patient is a well-nourished female, in no acute distress. The vital signs are documented above. CARDIAC: There is a regular rate and rhythm.  VASCULAR:  Right  radial pulse difficult to appreciate Left radial pulse palpable Left chest wall Port-A-Cath PULMONARY: No respiratory distress. ABDOMEN: Soft and non-tender. MUSCULOSKELETAL: There are no major deformities or cyanosis. NEUROLOGIC: No focal weakness or paresthesias are detected. SKIN: There are no ulcers or rashes noted. PSYCHIATRIC: The patient has a normal affect.  DATA:   UPPER EXTREMITY VEIN MAPPING  Patient Name:  Jessica Taylor  Date of Exam:   11/30/2023 Medical Rec #: [id number]       Accession #:    [id number] Date of Birth: December 25, 1940       Patient Gender: F Patient Age:   83 years Exam Location:  [facility] Procedure:      VAS US  UPPER EXT VEIN MAPPING (PRE-OP  AVF) Referring Phys: LONNI GASKINS   --------------------------------------------------------------------------- -----   Indications: Pre-access.  Performing Technologist: [place] RVT    Examination Guidelines: A complete evaluation includes B-mode imaging, spectral Doppler, color Doppler, and power Doppler as needed of all accessible portions of each vessel. Bilateral testing is considered an  integral part of a complete examination. Limited examinations for reoccurring indications may be performed as noted.  +-----------------+-------------+----------+--------+ Right Cephalic   Diameter (cm)Depth (cm)Findings +-----------------+-------------+----------+--------+ Shoulder             0.28                        +-----------------+-------------+----------+--------+ Prox upper arm       0.36                        +-----------------+-------------+----------+--------+ Mid upper arm        0.32                        +-----------------+-------------+----------+--------+ Dist upper arm       0.24                        +-----------------+-------------+----------+--------+ Antecubital fossa    0.26                         +-----------------+-------------+----------+--------+ Prox forearm         0.24                        +-----------------+-------------+----------+--------+ Mid forearm          0.27                        +-----------------+-------------+----------+--------+ Dist forearm         0.16                        +-----------------+-------------+----------+--------+  +-----------------+-------------+----------+--------+ Right Basilic    Diameter (cm)Depth (cm)Findings +-----------------+-------------+----------+--------+ Mid upper arm        0.46                        +-----------------+-------------+----------+--------+ Dist upper arm       0.42                        +-----------------+-------------+----------+--------+ Antecubital fossa    0.34                        +-----------------+-------------+----------+--------+  +-----------------+-------------+----------+--------+ Left Cephalic    Diameter (cm)Depth (cm)Findings +-----------------+-------------+----------+--------+ Shoulder             0.41                        +-----------------+-------------+----------+--------+ Prox upper arm       0.47                        +-----------------+-------------+----------+--------+ Mid upper arm        0.47                        +-----------------+-------------+----------+--------+ Dist upper arm       0.48                        +-----------------+-------------+----------+--------+ Antecubital fossa    0.42                        +-----------------+-------------+----------+--------+ Prox forearm  0.34                        +-----------------+-------------+----------+--------+ Mid forearm          0.33                        +-----------------+-------------+----------+--------+ Dist forearm         0.32                         +-----------------+-------------+----------+--------+  +-----------------+-------------+----------+--------+ Left Basilic     Diameter (cm)Depth (cm)Findings +-----------------+-------------+----------+--------+ Mid upper arm        0.36                        +-----------------+-------------+----------+--------+ Dist upper arm       0.32                        +-----------------+-------------+----------+--------+ Antecubital fossa    0.34                        +-----------------+-------------+----------+--------+  Summary: Right: Patent cepahlic and basilic veins. Left: Patent cepahlic and basilic veins.  *See table(s) above for measurements and observations.     Diagnosing physician: Lonni Gaskins MD Electronically signed by Lonni Gaskins MD on 11/30/2023 at 12:58:02 PM.      Assessment/Plan:   83 y.o. female, with history 3.2 cm AAA, CAD, hypertension, PAD, tobacco abuse, breast cancer, colon cancer, urothelial carcinoma left kidney, stage IV CKD that presents for evaluation of new permanent hemodialysis access.  I discussed typically recommending access in the nondominant arm which would be her left arm.  She does have a Port-A-Cath on the left for prior chemotherapy needs I discussed this could certainly lead to central venous stenosis or occlusion that could limit functioning of her left arm fistula.  We discussed the alternative of going to the right arm but she would prefer to use her nondominant arm that being the left arm.  She does have a good cephalic vein in the left arm.  I discussed this takes 3 months to mature after placement.  Risk benefits discussed including failure to mature, infection, steal etc.  Will get scheduled [facility] at her convenience.  Discussed current recommendations for access placement at stage IV and V CKD to allow access to mature to be functioning if and when needed.   Lonni DOROTHA Gaskins, MD [facility] Office: [phone]

## 2023-11-30 ENCOUNTER — Ambulatory Visit (INDEPENDENT_AMBULATORY_CARE_PROVIDER_SITE_OTHER): Payer: Medicare Other | Admitting: Vascular Surgery

## 2023-11-30 ENCOUNTER — Encounter: Payer: Self-pay | Admitting: Vascular Surgery

## 2023-11-30 ENCOUNTER — Ambulatory Visit: Payer: Medicare Other

## 2023-11-30 ENCOUNTER — Ambulatory Visit (INDEPENDENT_AMBULATORY_CARE_PROVIDER_SITE_OTHER): Payer: Medicare Other

## 2023-11-30 VITALS — BP 145/77 | HR 54 | Ht 63.0 in | Wt 158.0 lb

## 2023-11-30 DIAGNOSIS — N184 Chronic kidney disease, stage 4 (severe): Secondary | ICD-10-CM | POA: Diagnosis not present

## 2023-11-30 DIAGNOSIS — N183 Chronic kidney disease, stage 3 unspecified: Secondary | ICD-10-CM

## 2023-12-02 DIAGNOSIS — N184 Chronic kidney disease, stage 4 (severe): Secondary | ICD-10-CM | POA: Diagnosis not present

## 2023-12-02 DIAGNOSIS — I5032 Chronic diastolic (congestive) heart failure: Secondary | ICD-10-CM | POA: Diagnosis not present

## 2023-12-02 DIAGNOSIS — I2510 Atherosclerotic heart disease of native coronary artery without angina pectoris: Secondary | ICD-10-CM | POA: Diagnosis not present

## 2023-12-02 DIAGNOSIS — R809 Proteinuria, unspecified: Secondary | ICD-10-CM | POA: Diagnosis not present

## 2023-12-13 ENCOUNTER — Inpatient Hospital Stay: Payer: Medicare Other | Attending: Hematology

## 2023-12-13 ENCOUNTER — Inpatient Hospital Stay: Payer: Medicare Other

## 2023-12-13 VITALS — BP 152/52 | HR 60 | Resp 19

## 2023-12-13 DIAGNOSIS — C642 Malignant neoplasm of left kidney, except renal pelvis: Secondary | ICD-10-CM

## 2023-12-13 DIAGNOSIS — Z853 Personal history of malignant neoplasm of breast: Secondary | ICD-10-CM | POA: Insufficient documentation

## 2023-12-13 DIAGNOSIS — C182 Malignant neoplasm of ascending colon: Secondary | ICD-10-CM

## 2023-12-13 DIAGNOSIS — C652 Malignant neoplasm of left renal pelvis: Secondary | ICD-10-CM | POA: Insufficient documentation

## 2023-12-13 DIAGNOSIS — Z5112 Encounter for antineoplastic immunotherapy: Secondary | ICD-10-CM | POA: Diagnosis not present

## 2023-12-13 DIAGNOSIS — Z79620 Long term (current) use of immunosuppressive biologic: Secondary | ICD-10-CM | POA: Insufficient documentation

## 2023-12-13 LAB — MAGNESIUM: Magnesium: 1.8 mg/dL (ref 1.7–2.4)

## 2023-12-13 LAB — COMPREHENSIVE METABOLIC PANEL
ALT: 11 U/L (ref 0–44)
AST: 18 U/L (ref 15–41)
Albumin: 3.9 g/dL (ref 3.5–5.0)
Alkaline Phosphatase: 80 U/L (ref 38–126)
Anion gap: 10 (ref 5–15)
BUN: 23 mg/dL (ref 8–23)
CO2: 23 mmol/L (ref 22–32)
Calcium: 9.7 mg/dL (ref 8.9–10.3)
Chloride: 107 mmol/L (ref 98–111)
Creatinine, Ser: 2.78 mg/dL — ABNORMAL HIGH (ref 0.44–1.00)
GFR, Estimated: 16 mL/min — ABNORMAL LOW (ref >60)
Glucose, Bld: 121 mg/dL — ABNORMAL HIGH (ref 70–99)
Potassium: 3.7 mmol/L (ref 3.5–5.1)
Sodium: 140 mmol/L (ref 135–145)
Total Bilirubin: 0.7 mg/dL (ref 0.0–1.2)
Total Protein: 6.9 g/dL (ref 6.5–8.1)

## 2023-12-13 LAB — CBC WITH DIFFERENTIAL/PLATELET
Abs Immature Granulocytes: 0.02 10*3/uL (ref 0.00–0.07)
Basophils Absolute: 0.1 10*3/uL (ref 0.0–0.1)
Basophils Relative: 1 %
Eosinophils Absolute: 0.1 10*3/uL (ref 0.0–0.5)
Eosinophils Relative: 1 %
HCT: 41 % (ref 36.0–46.0)
Hemoglobin: 12.8 g/dL (ref 12.0–15.0)
Immature Granulocytes: 0 %
Lymphocytes Relative: 17 %
Lymphs Abs: 1 10*3/uL (ref 0.7–4.0)
MCH: 28.3 pg (ref 26.0–34.0)
MCHC: 31.2 g/dL (ref 30.0–36.0)
MCV: 90.7 fL (ref 80.0–100.0)
Monocytes Absolute: 0.4 10*3/uL (ref 0.1–1.0)
Monocytes Relative: 7 %
Neutro Abs: 4.3 10*3/uL (ref 1.7–7.7)
Neutrophils Relative %: 74 %
Platelets: 163 10*3/uL (ref 150–400)
RBC: 4.52 MIL/uL (ref 3.87–5.11)
RDW: 14.1 % (ref 11.5–15.5)
WBC: 5.9 10*3/uL (ref 4.0–10.5)
nRBC: 0 % (ref 0.0–0.2)

## 2023-12-13 LAB — TSH: TSH: 4.734 u[IU]/mL — ABNORMAL HIGH (ref 0.350–4.500)

## 2023-12-13 MED ORDER — SODIUM CHLORIDE 0.9 % IV SOLN
480.0000 mg | Freq: Once | INTRAVENOUS | Status: AC
  Administered 2023-12-13: 480 mg via INTRAVENOUS
  Filled 2023-12-13: qty 48

## 2023-12-13 MED ORDER — SODIUM CHLORIDE 0.9 % IV SOLN: Freq: Once | INTRAVENOUS | Status: AC

## 2023-12-13 MED ORDER — HEPARIN SOD (PORK) LOCK FLUSH 100 UNIT/ML IV SOLN
500.0000 [IU] | Freq: Once | INTRAVENOUS | Status: AC | PRN
  Administered 2023-12-13: 500 [IU]

## 2023-12-13 MED ORDER — SODIUM CHLORIDE 0.9% FLUSH
10.0000 mL | Freq: Once | INTRAVENOUS | Status: AC
  Administered 2023-12-13: 10 mL via INTRAVENOUS

## 2023-12-13 NOTE — Progress Notes (Signed)
Patient tolerated chemotherapy with no complaints voiced.  Side effects with management reviewed with understanding verbalized.  Port site clean and dry with no bruising or swelling noted at site.  Good blood return noted before and after administration of chemotherapy.  Band aid applied.  Patient left in satisfactory condition with VSS and no s/s of distress noted. All follow ups as scheduled.   Jessica Taylor [employer]

## 2023-12-13 NOTE — Patient Instructions (Signed)
[facility] - A DEPT OF [facility]  Discharge Instructions: Thank you for choosing [facility] to provide your oncology and hematology care.  If you have a lab appointment with the Cancer Center - please note that after April 8th, 2024, all labs will be drawn in the cancer center.  You do not have to check in or register with the main entrance as you have in the past but will complete your check-in in the cancer center.  Wear comfortable clothing and clothing appropriate for easy access to any Portacath or PICC line.   We strive to give you quality time with your provider. You may need to reschedule your appointment if you arrive late (15 or more minutes).  Arriving late affects you and other patients whose appointments are after yours.  Also, if you miss three or more appointments without notifying the office, you may be dismissed from the clinic at the provider's discretion.      For prescription refill requests, have your pharmacy contact our office and allow 72 hours for refills to be completed.    Today you received the following chemotherapy and/or immunotherapy agents opidvio   To help prevent nausea and vomiting after your treatment, we encourage you to take your nausea medication as directed.  BELOW ARE SYMPTOMS THAT SHOULD BE REPORTED IMMEDIATELY: *FEVER GREATER THAN 100.4 F (38 C) OR HIGHER *CHILLS OR SWEATING *NAUSEA AND VOMITING THAT IS NOT CONTROLLED WITH YOUR NAUSEA MEDICATION *UNUSUAL SHORTNESS OF BREATH *UNUSUAL BRUISING OR BLEEDING *URINARY PROBLEMS (pain or burning when urinating, or frequent urination) *BOWEL PROBLEMS (unusual diarrhea, constipation, pain near the anus) TENDERNESS IN MOUTH AND THROAT WITH OR WITHOUT PRESENCE OF ULCERS (sore throat, sores in mouth, or a toothache) UNUSUAL RASH, SWELLING OR PAIN  UNUSUAL VAGINAL DISCHARGE OR ITCHING   Items with * indicate a potential emergency and should be followed up as  soon as possible or go to the Emergency Department if any problems should occur.  Please show the CHEMOTHERAPY ALERT CARD or IMMUNOTHERAPY ALERT CARD at check-in to the Emergency Department and triage nurse.  Should you have questions after your visit or need to cancel or reschedule your appointment, please contact [facility]  - A DEPT OF [facility] [phone]  and follow the prompts.  Office hours are 8:00 a.m. to 4:30 p.m. Monday - Friday. Please note that voicemails left after 4:00 p.m. may not be returned until the following business day.  We are closed weekends and major holidays. You have access to a nurse at all times for urgent questions. Please call the main number to the clinic [phone] and follow the prompts.  For any non-urgent questions, you may also contact your provider using MyChart. We now offer e-Visits for anyone 58 and older to request care online for non-urgent symptoms. For details visit [email or web address].   Also download the MyChart app! Go to the app store, search "MyChart", open the app, select [place], and log in with your MyChart username and password.

## 2023-12-14 LAB — T4: T4, Total: 6.3 ug/dL (ref 4.5–12.0)

## 2023-12-16 ENCOUNTER — Other Ambulatory Visit: Payer: Self-pay | Admitting: Hematology

## 2023-12-30 ENCOUNTER — Other Ambulatory Visit: Payer: Self-pay | Admitting: Hematology

## 2024-01-10 ENCOUNTER — Encounter: Payer: Self-pay | Admitting: Hematology

## 2024-01-10 ENCOUNTER — Inpatient Hospital Stay: Payer: Medicare Other | Attending: Hematology

## 2024-01-10 ENCOUNTER — Inpatient Hospital Stay: Payer: Medicare Other

## 2024-01-10 VITALS — BP 143/67 | HR 66 | Temp 97.3°F | Resp 18 | Ht 64.0 in | Wt 161.0 lb

## 2024-01-10 DIAGNOSIS — Z5112 Encounter for antineoplastic immunotherapy: Secondary | ICD-10-CM | POA: Insufficient documentation

## 2024-01-10 DIAGNOSIS — Z95828 Presence of other vascular implants and grafts: Secondary | ICD-10-CM

## 2024-01-10 DIAGNOSIS — C182 Malignant neoplasm of ascending colon: Secondary | ICD-10-CM

## 2024-01-10 DIAGNOSIS — Z853 Personal history of malignant neoplasm of breast: Secondary | ICD-10-CM | POA: Diagnosis not present

## 2024-01-10 DIAGNOSIS — R911 Solitary pulmonary nodule: Secondary | ICD-10-CM | POA: Insufficient documentation

## 2024-01-10 DIAGNOSIS — Z85038 Personal history of other malignant neoplasm of large intestine: Secondary | ICD-10-CM | POA: Diagnosis not present

## 2024-01-10 DIAGNOSIS — Z79620 Long term (current) use of immunosuppressive biologic: Secondary | ICD-10-CM | POA: Diagnosis not present

## 2024-01-10 DIAGNOSIS — C642 Malignant neoplasm of left kidney, except renal pelvis: Secondary | ICD-10-CM

## 2024-01-10 DIAGNOSIS — C679 Malignant neoplasm of bladder, unspecified: Secondary | ICD-10-CM | POA: Diagnosis not present

## 2024-01-10 DIAGNOSIS — C773 Secondary and unspecified malignant neoplasm of axilla and upper limb lymph nodes: Secondary | ICD-10-CM | POA: Insufficient documentation

## 2024-01-10 LAB — COMPREHENSIVE METABOLIC PANEL
ALT: 11 U/L (ref 0–44)
AST: 17 U/L (ref 15–41)
Albumin: 4.1 g/dL (ref 3.5–5.0)
Alkaline Phosphatase: 78 U/L (ref 38–126)
Anion gap: 9 (ref 5–15)
BUN: 19 mg/dL (ref 8–23)
CO2: 23 mmol/L (ref 22–32)
Calcium: 9.5 mg/dL (ref 8.9–10.3)
Chloride: 107 mmol/L (ref 98–111)
Creatinine, Ser: 2.26 mg/dL — ABNORMAL HIGH (ref 0.44–1.00)
GFR, Estimated: 21 mL/min — ABNORMAL LOW (ref >60)
Glucose, Bld: 125 mg/dL — ABNORMAL HIGH (ref 70–99)
Potassium: 3.7 mmol/L (ref 3.5–5.1)
Sodium: 139 mmol/L (ref 135–145)
Total Bilirubin: 0.5 mg/dL (ref 0.0–1.2)
Total Protein: 6.8 g/dL (ref 6.5–8.1)

## 2024-01-10 LAB — CBC WITH DIFFERENTIAL/PLATELET
Abs Immature Granulocytes: 0.02 10*3/uL (ref 0.00–0.07)
Basophils Absolute: 0.1 10*3/uL (ref 0.0–0.1)
Basophils Relative: 1 %
Eosinophils Absolute: 0 10*3/uL (ref 0.0–0.5)
Eosinophils Relative: 1 %
HCT: 39.8 % (ref 36.0–46.0)
Hemoglobin: 12.7 g/dL (ref 12.0–15.0)
Immature Granulocytes: 0 %
Lymphocytes Relative: 21 %
Lymphs Abs: 1.1 10*3/uL (ref 0.7–4.0)
MCH: 29 pg (ref 26.0–34.0)
MCHC: 31.9 g/dL (ref 30.0–36.0)
MCV: 90.9 fL (ref 80.0–100.0)
Monocytes Absolute: 0.3 10*3/uL (ref 0.1–1.0)
Monocytes Relative: 6 %
Neutro Abs: 3.7 10*3/uL (ref 1.7–7.7)
Neutrophils Relative %: 71 %
Platelets: 171 10*3/uL (ref 150–400)
RBC: 4.38 MIL/uL (ref 3.87–5.11)
RDW: 14.3 % (ref 11.5–15.5)
WBC: 5.2 10*3/uL (ref 4.0–10.5)
nRBC: 0 % (ref 0.0–0.2)

## 2024-01-10 LAB — TSH: TSH: 3.286 u[IU]/mL (ref 0.350–4.500)

## 2024-01-10 LAB — MAGNESIUM: Magnesium: 1.6 mg/dL — ABNORMAL LOW (ref 1.7–2.4)

## 2024-01-10 MED ORDER — SODIUM CHLORIDE 0.9 % IV SOLN
480.0000 mg | Freq: Once | INTRAVENOUS | Status: AC
  Administered 2024-01-10: 480 mg via INTRAVENOUS
  Filled 2024-01-10: qty 48

## 2024-01-10 MED ORDER — SODIUM CHLORIDE 0.9% FLUSH
10.0000 mL | INTRAVENOUS | Status: DC | PRN
  Administered 2024-01-10: 10 mL

## 2024-01-10 MED ORDER — SODIUM CHLORIDE 0.9% FLUSH
10.0000 mL | INTRAVENOUS | Status: DC | PRN
Start: 2024-01-10 — End: 2024-01-10
  Administered 2024-01-10: 10 mL via INTRAVENOUS

## 2024-01-10 MED ORDER — MAGNESIUM SULFATE 2 GM/50ML IV SOLN
2.0000 g | Freq: Once | INTRAVENOUS | Status: AC
  Administered 2024-01-10: 2 g via INTRAVENOUS

## 2024-01-10 MED ORDER — HEPARIN SOD (PORK) LOCK FLUSH 100 UNIT/ML IV SOLN
500.0000 [IU] | Freq: Once | INTRAVENOUS | Status: AC | PRN
  Administered 2024-01-10: 500 [IU]

## 2024-01-10 MED ORDER — SODIUM CHLORIDE 0.9 % IV SOLN: Freq: Once | INTRAVENOUS | Status: AC

## 2024-01-10 NOTE — Progress Notes (Signed)
Patients port flushed without difficulty.  Good blood return noted with no bruising or swelling noted at site.  VSS. Patient remains accessed for treatment.  

## 2024-01-10 NOTE — Progress Notes (Unsigned)
 Patients port flushed without difficulty.  Good blood return noted with no bruising or swelling noted at site.  Band aid applied.  VSS with discharge and left in satisfactory condition with no s/s of distress noted.

## 2024-01-10 NOTE — Patient Instructions (Signed)
 [facility] [place] - A DEPT OF [facility]  Discharge Instructions: Thank you for choosing [facility] to provide your oncology and hematology care.  If you have a lab appointment with the Cancer Center - please note that after April 8th, 2024, all labs will be drawn in the cancer center.  You do not have to check in or register with the main entrance as you have in the past but will complete your check-in in the cancer center.  Wear comfortable clothing and clothing appropriate for easy access to any Portacath or PICC line.   We strive to give you quality time with your provider. You may need to reschedule your appointment if you arrive late (15 or more minutes).  Arriving late affects you and other patients whose appointments are after yours.  Also, if you miss three or more appointments without notifying the office, you may be dismissed from the clinic at the provider's discretion.      For prescription refill requests, have your pharmacy contact our office and allow 72 hours for refills to be completed.    Today you received the following chemotherapy and/or immunotherapy agents Opdivo.Nivolumab Injection What is this medication? NIVOLUMAB (nye VOL ue mab) treats some types of cancer. It works by helping your immune system slow or stop the spread of cancer cells. It is a monoclonal antibody. This medicine may be used for other purposes; ask your health care provider or pharmacist if you have questions. COMMON BRAND NAME(S): Opdivo What should I tell my care team before I take this medication? They need to know if you have any of these conditions: Allogeneic stem cell transplant (uses someone else's stem cells) Autoimmune diseases, such as Crohn disease, ulcerative colitis, lupus History of chest radiation Nervous system problems, such as Guillain-Barre syndrome or myasthenia gravis Organ transplant An unusual or allergic reaction to nivolumab, other  medications, foods, dyes, or preservatives Pregnant or trying to get pregnant Breast-feeding How should I use this medication? This medication is infused into a vein. It is given in a hospital or clinic setting. A special MedGuide will be given to you before each treatment. Be sure to read this information carefully each time. Talk to your care team about the use of this medication in children. While it may be prescribed for children as young as 12 years for selected conditions, precautions do apply. Overdosage: If you think you have taken too much of this medicine contact a poison control center or emergency room at once. NOTE: This medicine is only for you. Do not share this medicine with others. What if I miss a dose? Keep appointments for follow-up doses. It is important not to miss your dose. Call your care team if you are unable to keep an appointment. What may interact with this medication? Interactions have not been studied. This list may not describe all possible interactions. Give your health care provider a list of all the medicines, herbs, non-prescription drugs, or dietary supplements you use. Also tell them if you smoke, drink alcohol, or use illegal drugs. Some items may interact with your medicine. What should I watch for while using this medication? Your condition will be monitored carefully while you are receiving this medication. You may need blood work while taking this medication. This medication may cause serious skin reactions. They can happen weeks to months after starting the medication. Contact your care team right away if you notice fevers or flu-like symptoms with  a rash. The rash may be red or purple and then turn into blisters or peeling of the skin. You may also notice a red rash with swelling of the face, lips, or lymph nodes in your neck or under your arms. Tell your care team right away if you have any change in your eyesight. Talk to your care team if you are  pregnant or think you might be pregnant. A negative pregnancy test is required before starting this medication. A reliable form of contraception is recommended while taking this medication and for 5 months after the last dose. Talk to your care team about effective forms of contraception. Do not breast-feed while taking this medication and for 5 months after the last dose. What side effects may I notice from receiving this medication? Side effects that you should report to your care team as soon as possible: Allergic reactions--skin rash, itching, hives, swelling of the face, lips, tongue, or throat Dry cough, shortness of breath or trouble breathing Eye pain, redness, irritation, or discharge with blurry or decreased vision Heart muscle inflammation--unusual weakness or fatigue, shortness of breath, chest pain, fast or irregular heartbeat, dizziness, swelling of the ankles, feet, or hands Hormone gland problems--headache, sensitivity to light, unusual weakness or fatigue, dizziness, fast or irregular heartbeat, increased sensitivity to cold or heat, excessive sweating, constipation, hair loss, increased thirst or amount of urine, tremors or shaking, irritability Infusion reactions--chest pain, shortness of breath or trouble breathing, feeling faint or lightheaded Kidney injury (glomerulonephritis)--decrease in the amount of urine, red or dark Jaydalyn Demattia urine, foamy or bubbly urine, swelling of the ankles, hands, or feet Liver injury--right upper belly pain, loss of appetite, nausea, light-colored stool, dark yellow or Irven Ingalsbe urine, yellowing skin or eyes, unusual weakness or fatigue Pain, tingling, or numbness in the hands or feet, muscle weakness, change in vision, confusion or trouble speaking, loss of balance or coordination, trouble walking, seizures Rash, fever, and swollen lymph nodes Redness, blistering, peeling, or loosening of the skin, including inside the mouth Sudden or severe stomach pain,  bloody diarrhea, fever, nausea, vomiting Side effects that usually do not require medical attention (report these to your care team if they continue or are bothersome): Bone, joint, or muscle pain Diarrhea Fatigue Loss of appetite Nausea Skin rash This list may not describe all possible side effects. Call your doctor for medical advice about side effects. You may report side effects to FDA at 1-800-FDA-1088. Where should I keep my medication? This medication is given in a hospital or clinic. It will not be stored at home. NOTE: This sheet is a summary. It may not cover all possible information. If you have questions about this medicine, talk to your doctor, pharmacist, or health care provider.  2024 Elsevier/Gold Standard (2022-03-09 00:00:00)       To help prevent nausea and vomiting after your treatment, we encourage you to take your nausea medication as directed.  BELOW ARE SYMPTOMS THAT SHOULD BE REPORTED IMMEDIATELY: *FEVER GREATER THAN 100.4 F (38 C) OR HIGHER *CHILLS OR SWEATING *NAUSEA AND VOMITING THAT IS NOT CONTROLLED WITH YOUR NAUSEA MEDICATION *UNUSUAL SHORTNESS OF BREATH *UNUSUAL BRUISING OR BLEEDING *URINARY PROBLEMS (pain or burning when urinating, or frequent urination) *BOWEL PROBLEMS (unusual diarrhea, constipation, pain near the anus) TENDERNESS IN MOUTH AND THROAT WITH OR WITHOUT PRESENCE OF ULCERS (sore throat, sores in mouth, or a toothache) UNUSUAL RASH, SWELLING OR PAIN  UNUSUAL VAGINAL DISCHARGE OR ITCHING   Items with * indicate a potential emergency and should  be followed up as soon as possible or go to the Emergency Department if any problems should occur.  Please show the CHEMOTHERAPY ALERT CARD or IMMUNOTHERAPY ALERT CARD at check-in to the Emergency Department and triage nurse.  Should you have questions after your visit or need to cancel or reschedule your appointment, please contact [facility] [place] - A DEPT OF Eligha Brideg[facility] [phone]  and follow the prompts.  Office hours are 8:00 a.m. to 4:30 p.m. Monday - Friday. Please note that voicemails left after 4:00 p.m. may not be returned until the following business day.  We are closed weekends and major holidays. You have access to a nurse at all times for urgent questions. Please call the main number to the clinic [phone] and follow the prompts.  For any non-urgent questions, you may also contact your provider using MyChart. We now offer e-Visits for anyone 46 and older to request care online for non-urgent symptoms. For details visit [email or web address].   Also download the MyChart app! Go to the app store, search "MyChart", open the app, select [place], and log in with your MyChart username and password.

## 2024-01-10 NOTE — Progress Notes (Signed)
Patient presents today for Opdivo infusion. Vital signs and labs are within parameters for treatment. Creatinine 2.26. Treatment parameters for her are >2.5. Magnesium 1.6. Standing orders followed. Patient will receive 2 grams of Magnesium Sulfate.   Treatment given today per MD orders. Tolerated infusion without adverse affects. Vital signs stable. No complaints at this time. Discharged from clinic ambulatory in stable condition. Alert and oriented x 3. F/U with [facility] as scheduled.

## 2024-01-11 ENCOUNTER — Encounter: Payer: Self-pay | Admitting: Hematology

## 2024-01-11 LAB — T4: T4, Total: 6.3 ug/dL (ref 4.5–12.0)

## 2024-01-13 ENCOUNTER — Other Ambulatory Visit: Payer: Self-pay | Admitting: *Deleted

## 2024-01-13 MED ORDER — VITAMIN D (ERGOCALCIFEROL) 1.25 MG (50000 UNIT) PO CAPS: 50000.0000 [IU] | ORAL_CAPSULE | ORAL | 0 refills | Status: DC

## 2024-01-19 ENCOUNTER — Other Ambulatory Visit: Payer: Self-pay | Admitting: Hematology

## 2024-01-25 ENCOUNTER — Other Ambulatory Visit: Payer: Self-pay

## 2024-02-03 DIAGNOSIS — D631 Anemia in chronic kidney disease: Secondary | ICD-10-CM | POA: Diagnosis not present

## 2024-02-03 DIAGNOSIS — R809 Proteinuria, unspecified: Secondary | ICD-10-CM | POA: Diagnosis not present

## 2024-02-03 DIAGNOSIS — E211 Secondary hyperparathyroidism, not elsewhere classified: Secondary | ICD-10-CM | POA: Diagnosis not present

## 2024-02-03 DIAGNOSIS — N189 Chronic kidney disease, unspecified: Secondary | ICD-10-CM | POA: Diagnosis not present

## 2024-02-04 ENCOUNTER — Other Ambulatory Visit: Payer: Self-pay

## 2024-02-04 MED ORDER — HYDROXYZINE HCL 25 MG PO TABS: ORAL_TABLET | ORAL | 0 refills | Status: DC

## 2024-02-07 ENCOUNTER — Inpatient Hospital Stay: Payer: Medicare Other

## 2024-02-07 ENCOUNTER — Inpatient Hospital Stay: Payer: Medicare Other | Attending: Hematology | Admitting: Hematology

## 2024-02-07 VITALS — BP 140/58 | HR 62 | Temp 97.6°F | Resp 18 | Wt 158.1 lb

## 2024-02-07 DIAGNOSIS — Z170 Estrogen receptor positive status [ER+]: Secondary | ICD-10-CM | POA: Insufficient documentation

## 2024-02-07 DIAGNOSIS — Z79811 Long term (current) use of aromatase inhibitors: Secondary | ICD-10-CM | POA: Insufficient documentation

## 2024-02-07 DIAGNOSIS — C642 Malignant neoplasm of left kidney, except renal pelvis: Secondary | ICD-10-CM

## 2024-02-07 DIAGNOSIS — Z5112 Encounter for antineoplastic immunotherapy: Secondary | ICD-10-CM | POA: Diagnosis not present

## 2024-02-07 DIAGNOSIS — Z87891 Personal history of nicotine dependence: Secondary | ICD-10-CM | POA: Diagnosis not present

## 2024-02-07 DIAGNOSIS — C50911 Malignant neoplasm of unspecified site of right female breast: Secondary | ICD-10-CM

## 2024-02-07 DIAGNOSIS — Z79620 Long term (current) use of immunosuppressive biologic: Secondary | ICD-10-CM | POA: Insufficient documentation

## 2024-02-07 DIAGNOSIS — Z1721 Progesterone receptor positive status: Secondary | ICD-10-CM | POA: Diagnosis not present

## 2024-02-07 DIAGNOSIS — Z808 Family history of malignant neoplasm of other organs or systems: Secondary | ICD-10-CM | POA: Diagnosis not present

## 2024-02-07 DIAGNOSIS — C652 Malignant neoplasm of left renal pelvis: Secondary | ICD-10-CM | POA: Insufficient documentation

## 2024-02-07 DIAGNOSIS — R911 Solitary pulmonary nodule: Secondary | ICD-10-CM | POA: Diagnosis not present

## 2024-02-07 DIAGNOSIS — Z1732 Human epidermal growth factor receptor 2 negative status: Secondary | ICD-10-CM | POA: Diagnosis not present

## 2024-02-07 DIAGNOSIS — C182 Malignant neoplasm of ascending colon: Secondary | ICD-10-CM | POA: Diagnosis not present

## 2024-02-07 DIAGNOSIS — E039 Hypothyroidism, unspecified: Secondary | ICD-10-CM | POA: Insufficient documentation

## 2024-02-07 DIAGNOSIS — R197 Diarrhea, unspecified: Secondary | ICD-10-CM | POA: Insufficient documentation

## 2024-02-07 DIAGNOSIS — Z8041 Family history of malignant neoplasm of ovary: Secondary | ICD-10-CM | POA: Insufficient documentation

## 2024-02-07 DIAGNOSIS — Z95828 Presence of other vascular implants and grafts: Secondary | ICD-10-CM

## 2024-02-07 DIAGNOSIS — E0590 Thyrotoxicosis, unspecified without thyrotoxic crisis or storm: Secondary | ICD-10-CM

## 2024-02-07 DIAGNOSIS — Z9049 Acquired absence of other specified parts of digestive tract: Secondary | ICD-10-CM | POA: Diagnosis not present

## 2024-02-07 LAB — COMPREHENSIVE METABOLIC PANEL
ALT: 9 U/L (ref 0–44)
AST: 16 U/L (ref 15–41)
Albumin: 3.9 g/dL (ref 3.5–5.0)
Alkaline Phosphatase: 79 U/L (ref 38–126)
Anion gap: 11 (ref 5–15)
BUN: 23 mg/dL (ref 8–23)
CO2: 23 mmol/L (ref 22–32)
Calcium: 10.1 mg/dL (ref 8.9–10.3)
Chloride: 104 mmol/L (ref 98–111)
Creatinine, Ser: 2.54 mg/dL — ABNORMAL HIGH (ref 0.44–1.00)
GFR, Estimated: 18 mL/min — ABNORMAL LOW (ref >60)
Glucose, Bld: 111 mg/dL — ABNORMAL HIGH (ref 70–99)
Potassium: 3.7 mmol/L (ref 3.5–5.1)
Sodium: 138 mmol/L (ref 135–145)
Total Bilirubin: 0.7 mg/dL (ref 0.0–1.2)
Total Protein: 6.8 g/dL (ref 6.5–8.1)

## 2024-02-07 LAB — MAGNESIUM: Magnesium: 1.7 mg/dL (ref 1.7–2.4)

## 2024-02-07 LAB — CBC WITH DIFFERENTIAL/PLATELET
Abs Immature Granulocytes: 0.03 10*3/uL (ref 0.00–0.07)
Basophils Absolute: 0.1 10*3/uL (ref 0.0–0.1)
Basophils Relative: 1 %
Eosinophils Absolute: 0.2 10*3/uL (ref 0.0–0.5)
Eosinophils Relative: 3 %
HCT: 39.4 % (ref 36.0–46.0)
Hemoglobin: 12.6 g/dL (ref 12.0–15.0)
Immature Granulocytes: 1 %
Lymphocytes Relative: 15 %
Lymphs Abs: 0.9 10*3/uL (ref 0.7–4.0)
MCH: 28.9 pg (ref 26.0–34.0)
MCHC: 32 g/dL (ref 30.0–36.0)
MCV: 90.4 fL (ref 80.0–100.0)
Monocytes Absolute: 0.4 10*3/uL (ref 0.1–1.0)
Monocytes Relative: 7 %
Neutro Abs: 4.4 10*3/uL (ref 1.7–7.7)
Neutrophils Relative %: 73 %
Platelets: 169 10*3/uL (ref 150–400)
RBC: 4.36 MIL/uL (ref 3.87–5.11)
RDW: 14.5 % (ref 11.5–15.5)
WBC: 6 10*3/uL (ref 4.0–10.5)
nRBC: 0 % (ref 0.0–0.2)

## 2024-02-07 LAB — TSH: TSH: 3.919 u[IU]/mL (ref 0.350–4.500)

## 2024-02-07 MED ORDER — SODIUM CHLORIDE 0.9 % IV SOLN: Freq: Once | INTRAVENOUS | Status: AC

## 2024-02-07 MED ORDER — SODIUM CHLORIDE 0.9% FLUSH
10.0000 mL | INTRAVENOUS | Status: DC | PRN
  Administered 2024-02-07: 10 mL via INTRAVENOUS

## 2024-02-07 MED ORDER — SODIUM CHLORIDE 0.9% FLUSH
10.0000 mL | INTRAVENOUS | Status: DC | PRN
  Administered 2024-02-07: 10 mL

## 2024-02-07 MED ORDER — SODIUM CHLORIDE 0.9 % IV SOLN
480.0000 mg | Freq: Once | INTRAVENOUS | Status: AC
  Administered 2024-02-07: 480 mg via INTRAVENOUS
  Filled 2024-02-07: qty 48

## 2024-02-07 MED ORDER — HEPARIN SOD (PORK) LOCK FLUSH 100 UNIT/ML IV SOLN
500.0000 [IU] | Freq: Once | INTRAVENOUS | Status: AC | PRN
  Administered 2024-02-07: 500 [IU]

## 2024-02-07 NOTE — Patient Instructions (Signed)
 [facility] at [facility] Discharge Instructions   You were seen and examined today by Dr. Ellin Saba.  He reviewed the results of your lab work which are normal/stable.   We will proceed with your treatment today.   We will see you back in 8 weeks. We will repeat a CT scan prior to this visit.   Return as scheduled.    Thank you for choosing  Cancer Center at [facility] to provide your oncology and hematology care.  To afford each patient quality time with our provider, please arrive at least 15 minutes before your scheduled appointment time.   If you have a lab appointment with the Cancer Center please come in thru the Main Entrance and check in at the main information desk.  You need to re-schedule your appointment should you arrive 10 or more minutes late.  We strive to give you quality time with our providers, and arriving late affects you and other patients whose appointments are after yours.  Also, if you no show three or more times for appointments you may be dismissed from the clinic at the providers discretion.     Again, thank you for choosing [facility].  Our hope is that these requests will decrease the amount of time that you wait before being seen by our physicians.       _____________________________________________________________  Should you have questions after your visit to [facility], please contact our office at [phone] and follow the prompts.  Our office hours are 8:00 a.m. and 4:30 p.m. Monday - Friday.  Please note that voicemails left after 4:00 p.m. may not be returned until the following business day.  We are closed weekends and major holidays.  You do have access to a nurse 24-7, just call the main number to the clinic [phone] and do not press any options, hold on the line and a nurse will answer the phone.    For prescription refill requests, have your pharmacy contact our office  and allow 72 hours.    Due to Covid, you will need to wear a mask upon entering the hospital. If you do not have a mask, a mask will be given to you at the Main Entrance upon arrival. For doctor visits, patients may have 1 support person age 32 or older with them. For treatment visits, patients can not have anyone with them due to social distancing guidelines and our immunocompromised population.

## 2024-02-07 NOTE — Progress Notes (Signed)
 [facility] [address] [place] [address]    Clinic Day:  02/07/2024  Referring physician: Tally Joe, MD  Patient Care Team: Tally Joe, MD as PCP - General (Family Medicine) Jake Bathe, MD as PCP - Cardiology (Cardiology) Karie Soda, MD as Consulting Physician (General Surgery) Jake Bathe, MD as Consulting Physician (Cardiology) Mansouraty, Netty Starring., MD as Consulting Physician (Gastroenterology) Mickie Bail, RN as Oncology Nurse Navigator Doreatha Massed, MD as Consulting Physician (Medical Oncology)   ASSESSMENT & PLAN:   Assessment: 1.  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  Right hemicolectomy on 10/11/2019, grade 3 adenocarcinoma, negative margins, positive LVSI, 2 tumor deposits, 0/15 lymph nodes positive, PT3PN1C. 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  MLH1 hyper methylation present. -PET scan in December 2020 showed lymph node in the right axillary region.  Biopsy consistent with metastatic colon cancer. -Last CEA was 10.7 on 11/22/2019. -Right axillary lymph node excision on 04/02/2020 consistent with metastatic carcinoma from colon cancer. -Pembrolizumab started on 05/14/2020. -PET scan on 08/05/2020 shows complete resolution of lymphadenopathy.  No evidence of new areas of uptake.  Mild vague residual hypermetabolism in the right axilla without soft tissue mass. -PET scan on 12/23/2020 with no evidence of recurrence. Rande Lawman held since we started opdivo on 08/27/2022   2.  Stage I (PT1CPN0) right Breast, Grade 2 Invasive Lobular Carcinoma: -MRI of the breast on 12/25/2019 showed 1 cm mass behind the right areola with a suspicious mass in the left breast. -Right breast lumpectomy on 04/02/2020 shows invasive lobular carcinoma, grade 2, 1.2 cm.  Resection margins are negative.  Negative LVSI.  2 sentinel lymph nodes  were negative for carcinoma.  ER/PR 100% positive, HER-2 negative, Ki-67 10%.   3.  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 - Based on her renal function, she is not a candidate for cisplatin based chemotherapy.  Nivolumab was recommended for 1 year. - PET scan (08/06/2022): No evidence of metastatic disease. - Opdivo for 1 year started on 08/27/2022    Plan: 1.  Stage IV colon cancer to the right axillary lymph node: - CT CAP (11/09/2023): 9 mm subpleural nodule in the left lower lobe, increased from 7 mm on scan from June.  No new lesions. - Denies any immunotherapy related side effects.  She has on and off diarrhea which is mild.  She had left calf skin lesions which bleed on pressure.  She was told to follow-up with dermatology. - Labs today: Creatinine 2.54.  LFTs normal.  CBC grossly normal.  TSH is 3.9.  CEA is between 10 and 11. - She may proceed with nivolumab today and every 4 weeks.  I will give 500 mL of normal saline bolus today.  RTC 8 weeks for follow-up.  Will repeat CT CAP prior to next visit.  If left lower lower lobe nodule continues to grow, will consider SBRT.   2.  Right breast invasive lobular carcinoma, grade 2: - Continue anastrozole daily.  She is tolerating well. - Abnormal mammogram in November 2024, status post biopsy which was benign  breast tissue with fat necrosis.   3.  Left lung pulmonary nodule: - CT CAP on 11/09/2023 showed left lower lobe lung nodule measuring 9 mm, previously 7 mm in June 2024 and 4 mm in July 2023. - I will repeat CT chest in May.   4.  Stage III  (PT3PN0) left renal pelvis high-grade urothelial carcinoma: - She has completed 1 year of adjuvant nivolumab.  Will continue surveillance scans.   5.  Hypothyroidism: - Continue Synthroid 50 mcg daily.  TSH is 3.9.    Orders Placed This Encounter  Procedures   CT CHEST ABDOMEN PELVIS W CONTRAST    Standing Status:   Future    Expected Date:   03/27/2024    Expiration Date:   02/06/2025    If indicated for the ordered procedure, I authorize the administration of contrast media per Radiology protocol:   Yes    Does the patient have a contrast media/X-ray dye allergy?:   No    Preferred imaging location?:   [facility]    If indicated for the ordered procedure, I authorize the administration of oral contrast media per Radiology protocol:   Yes   CEA    Standing Status:   Future    Expected Date:   03/08/2024    Expiration Date:   03/08/2025   Magnesium    Standing Status:   Future    Expected Date:   04/06/2024    Expiration Date:   04/06/2025   CBC with Differential    Standing Status:   Future    Expected Date:   04/06/2024    Expiration Date:   04/07/2025   Comprehensive metabolic panel    Standing Status:   Future    Expected Date:   04/06/2024    Expiration Date:   04/07/2025   T4    Standing Status:   Future    Expected Date:   04/06/2024    Expiration Date:   04/07/2025   TSH    Standing Status:   Future    Expected Date:   04/06/2024    Expiration Date:   04/07/2025   CEA    Standing Status:   Future    Expected Date:   05/05/2024    Expiration Date:   05/05/2025   Magnesium    Standing Status:   Future    Expected Date:   05/05/2024    Expiration Date:   05/05/2025   CBC with Differential    Standing Status:   Future    Expected Date:   05/05/2024    Expiration Date:   05/06/2025   Comprehensive metabolic panel    Standing Status:   Future    Expected Date:   05/05/2024    Expiration Date:   05/06/2025   T4    Standing Status:   Future    Expected Date:   05/05/2024     Expiration Date:   05/06/2025   TSH    Standing Status:   Future    Expected Date:   05/05/2024    Expiration Date:   05/06/2025   Magnesium    Standing Status:   Future    Expected Date:   06/03/2024    Expiration Date:   06/03/2025   CBC with Differential    Standing Status:   Future    Expected Date:   06/03/2024    Expiration Date:   06/04/2025   Comprehensive metabolic panel    Standing  Status:   Future    Expected Date:   06/03/2024    Expiration Date:   06/04/2025   T4    Standing Status:   Future    Expected Date:   06/03/2024    Expiration Date:   06/04/2025   TSH    Standing Status:   Future    Expected Date:   06/03/2024    Expiration Date:   06/04/2025      Mikeal Hawthorne R Teague,acting as a scribe for Doreatha Massed, MD.,have documented all relevant documentation on the behalf of Doreatha Massed, MD,as directed by  Doreatha Massed, MD while in the presence of Doreatha Massed, MD.  I, Doreatha Massed MD, have reviewed the above documentation for accuracy and completeness, and I agree with the above.    Doreatha Massed, MD   3/17/20251:28 PM  CHIEF COMPLAINT:   Diagnosis: stage I right breast cancer and colon cancer and left kidney urothelial carcinoma    Cancer Staging  Cancer of ascending colon s/p robotic proximal colectomy 10/11/2019 Staging form: Colon and Rectum, AJCC 8th Edition - Clinical stage from 10/23/2019: Stage IVA (cT3, cN1c, cM1a) - Signed by Doreatha Massed, MD on 12/14/2019  Malignant neoplasm of right female breast [facility]) Staging form: Breast, AJCC 8th Edition - Clinical stage from 04/23/2020: Stage IA (cT1c, cN0(sn), cM0, G2, ER+, PR+, HER2-) - Signed by Doreatha Massed, MD on 04/23/2020  Urothelial carcinoma of kidney, left [facility]) Staging form: Kidney, AJCC 8th Edition - Clinical stage from 08/18/2022: Stage III (cT3, cN0, cM0) - Unsigned    Prior Therapy: 1. Robotic proximal colectomy on 10/11/2019. 2. Right breast  lumpectomy and SLNB on 04/02/2020 3.  Left radical nephro ureterectomy, lymphadenectomy, bladder cuff excision on 07/13/2022  Current Therapy:  Opdivo every 4 weeks    HISTORY OF PRESENT ILLNESS:   Oncology History  Cancer of ascending colon s/p robotic proximal colectomy 10/11/2019  10/11/2019 Initial Diagnosis   Cancer of ascending colon s/p robotic proximal colectomy 10/11/2019   10/23/2019 Cancer Staging   Staging form: Colon and Rectum, AJCC 8th Edition - Clinical stage from 10/23/2019: Stage IVA (cT3, cN1c, cM1a) - Signed by Doreatha Massed, MD on 12/14/2019   05/14/2020 - 06/26/2022 Chemotherapy   Patient is on Treatment Plan : COLORECTAL Pembrolizumab q21d     05/14/2020 - 06/26/2022 Chemotherapy   Patient is on Treatment Plan : COLORECTAL Pembrolizumab (200) q21d     08/27/2022 -  Chemotherapy   Patient is on Treatment Plan : COLORECTAL Nivolumab (480) q28d     Malignant neoplasm of right female breast (HCC)  04/23/2020 Initial Diagnosis   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 (HCC)   04/23/2020 Cancer Staging   Staging form: Breast, AJCC 8th Edition - Clinical stage from 04/23/2020: Stage IA (cT1c, cN0(sn), cM0, G2, ER+, PR+, HER2-) - Signed by Doreatha Massed, MD on 04/23/2020   Urothelial carcinoma of kidney, left (HCC)  08/18/2022 Initial Diagnosis   Urothelial carcinoma of kidney, left (HCC)   08/27/2022 -  Chemotherapy   Patient is on Treatment Plan : COLORECTAL Nivolumab (480) q28d        INTERVAL HISTORY:   Oreta is a 83 y.o. female presenting to clinic today for follow up of stage I right breast cancer and colon cancer and left kidney urothelial carcinoma. She was last seen by me on 11/15/23.  Today, she states that she is doing well overall. Her appetite level is at 90%. Her energy level is at 75%.  PAST  MEDICAL HISTORY:   Past Medical History: Past Medical History:  Diagnosis Date   Abdominal aortic aneurysm (HCC)    Acid reflux     Anemia    Arthritis    knees , R shoulder - tx /w injection - 11/2014   Basal cell carcinoma (BCC) of dorsum of nose 2010   Resolved   Cancer (HCC)    basal cell on nose   Colon cancer (HCC)    Coronary artery disease    Hypercholesteremia    Hypertension    Hypothyroidism    Lt Acute pyelonephritis 09/11/2018   MI, old 2000   Peripheral arterial disease (HCC)    hhigh-grade ostial bilateral calcified iliac stenosis with claudication   Tobacco abuse    Vertigo    when lays on left side.    Surgical History: Past Surgical History:  Procedure Laterality Date   BREAST BIOPSY Right 2021   Invasive lobular carcinoma/ Metastatic carcinoma   BREAST BIOPSY Left 2021   Isolated microscopic foci of atypical ductal hyperplasia   BREAST BIOPSY Right 10/07/2023   MM RT BREAST BX W LOC DEV 1ST LESION IMAGE BX SPEC STEREO GUIDE 10/07/2023 [facility]   BREAST BIOPSY Right 10/07/2023   MM RT BREAST BX W LOC DEV EA AD LESION IMG BX SPEC STEREO GUIDE 10/07/2023 [facility]   BREAST LUMPECTOMY WITH RADIOACTIVE SEED AND SENTINEL LYMPH NODE BIOPSY Bilateral 04/02/2020   Procedure: BILATERAL BREAST LUMPECTOMY WITH RADIOACTIVE SEED AND RIGHT SENTINEL LYMPH NODE BIOPSY AND RIGHT TARGETED AXILLARY LYMPH NODE BIOPSY;  Surgeon: Harriette Bouillon, MD;  Location: [facility];  Service: General;  Laterality: Bilateral;   CARDIAC CATHETERIZATION  5 stents   CARDIAC CATHETERIZATION N/A 01/20/2016   Procedure: Left Heart Cath and Coronary Angiography;  Surgeon: Peter M Swaziland, MD;  Location: [facility];  Service: Cardiovascular;  Laterality: N/A;   CATARACT EXTRACTION W/PHACO Left 05/24/2014   Procedure: CATARACT EXTRACTION PHACO AND INTRAOCULAR LENS PLACEMENT (IOC);  Surgeon: Gemma Payor, MD;  Location: [facility];  Service: Ophthalmology;  Laterality: Left;  CDE:  9.30   CATARACT EXTRACTION W/PHACO Right 06/18/2014   Procedure: CATARACT EXTRACTION PHACO AND INTRAOCULAR LENS  PLACEMENT RIGHT EYE CDE=10.84;  Surgeon: Gemma Payor, MD;  Location: [facility];  Service: Ophthalmology;  Laterality: Right;   CORONARY ARTERY BYPASS GRAFT N/A 01/24/2016   Procedure: CORONARY ARTERY BYPASS GRAFTING (CABG);  Surgeon: Alleen Borne, MD;  Location[facility] OR;  Service: Open Heart Surgery;  Laterality: N/A;   CORONARY STENT PLACEMENT     CORONARY STENT PLACEMENT  03/06/2015   CFX DES   CYSTOSCOPY/URETEROSCOPY/HOLMIUM LASER/STENT PLACEMENT Left 09/12/2018   Procedure: CYSTOSCOPY/URETEROSCOPY/STENT PLACEMENT;  Surgeon: Rene Paci, MD;  Location: [facility];  Service: Urology;  Laterality: Left;   EYE SURGERY     LEFT HEART CATH AND CORS/GRAFTS ANGIOGRAPHY N/A 09/12/2019   Procedure: LEFT HEART CATH AND CORS/GRAFTS ANGIOGRAPHY;  Surgeon: Lennette Bihari, MD;  Location: [facility];  Service: Cardiovascular;  Laterality: N/A;   LEFT HEART CATHETERIZATION WITH CORONARY ANGIOGRAM N/A 03/06/2015   Procedure: LEFT HEART CATHETERIZATION WITH CORONARY ANGIOGRAM;  Surgeon: Runell Gess, MD;  Location: [facility] CATH LAB;  Service: Cardiovascular;  Laterality: N/A;   PARTIAL KNEE ARTHROPLASTY Right 02/04/2015   Procedure: UNICOMPARTMENTAL KNEE;  Surgeon: Jodi Geralds, MD;  Location: [facility];  Service: Orthopedics;  Laterality: Right;   PERIPHERAL VASCULAR CATHETERIZATION Bilateral 05/13/2015   Procedure: Lower Extremity Angiography;  Surgeon: Runell Gess,  MD;  Location: [facility];  Service: Cardiovascular;  Laterality: Bilateral;   PERIPHERAL VASCULAR CATHETERIZATION N/A 05/13/2015   Procedure: Abdominal Aortogram;  Surgeon: Runell Gess, MD;  Location: [facility];  Service: Cardiovascular;  Laterality: N/A;   PERIPHERAL VASCULAR CATHETERIZATION Bilateral 06/27/2015   Procedure: Peripheral Vascular Intervention;  Surgeon: Runell Gess, MD;  Location: [facility] INVASIVE CV LAB;  Service: Cardiovascular;  Laterality: Bilateral;  ILIACS   PERIPHERAL VASCULAR  CATHETERIZATION Bilateral 06/27/2015   Procedure: Peripheral Vascular Atherectomy;  Surgeon: Runell Gess, MD;  Location: [facility];  Service: Cardiovascular;  Laterality: Bilateral;   PORTACATH PLACEMENT Left 05/24/2020   Procedure: INSERTION PORT-A-CATH;  Surgeon: Franky Macho, MD;  Location: [facility];  Service: General;  Laterality: Left;   TEE WITHOUT CARDIOVERSION N/A 01/24/2016   Procedure: TRANSESOPHAGEAL ECHOCARDIOGRAM (TEE);  Surgeon: Alleen Borne, MD;  Location[facility] OR;  Service: Open Heart Surgery;  Laterality: N/A;   TONSILLECTOMY     age 65   TUBAL LIGATION      Social History: Social History   Socioeconomic History   Marital status: Married    Spouse name: Tommy   Number of children: 4   Years of education: Not on file   Highest education level: Not on file  Occupational History   Occupation: retired    Comment: worked as Tree surgeon for [employer]  Tobacco Use   Smoking status: Former    Current packs/day: 0.00    Average packs/day: 1.5 packs/day for 42.0 years (63.0 ttl pk-yrs)    Types: Cigarettes    Start date: 05/18/1957    Quit date: 05/19/1999    Years since quitting: 24.7   Smokeless tobacco: Never  Vaping Use   Vaping status: Never Used  Substance and Sexual Activity   Alcohol use: No   Drug use: No   Sexual activity: Yes    Birth control/protection: Surgical  Other Topics Concern   Not on file  Social History Narrative   Not on file   Social Drivers of Health   Financial Resource Strain: Medium Risk (02/18/2021)   Overall Financial Resource Strain (CARDIA)    Difficulty of Paying Living Expenses: Somewhat hard  Food Insecurity: No Food Insecurity (02/18/2021)   Hunger Vital Sign    Worried About Running Out of Food in the Last Year: Never true    Ran Out of Food in the Last Year: Never true  Transportation Needs: No Transportation Needs (02/18/2021)   PRAPARE - Administrator, Civil Service (Medical): No     Lack of Transportation (Non-Medical): No  Physical Activity: Inactive (02/18/2021)   Exercise Vital Sign    Days of Exercise per Week: 0 days    Minutes of Exercise per Session: 0 min  Stress: No Stress Concern Present (02/18/2021)   [employer] of Occupational Health - Occupational Stress Questionnaire    Feeling of Stress : Not at all  Social Connections: Moderately Isolated (02/18/2021)   Social Connection and Isolation Panel [NHANES]    Frequency of Communication with Friends and Family: More than three times a week    Frequency of Social Gatherings with Friends and Family: Three times a week    Attends Religious Services: Never    Active Member of Clubs or Organizations: No    Attends Banker Meetings: Never    Marital Status: Married  Catering manager Violence: Not At Risk (02/18/2021)   Humiliation, Afraid, Rape, and  Kick questionnaire    Fear of Current or Ex-Partner: No    Emotionally Abused: No    Physically Abused: No    Sexually Abused: No    Family History: Family History  Problem Relation Age of Onset   Ovarian cancer Mother 64   Cancer Father 52       unsure of which kind, "it was in his glands"   Hypertension Maternal Grandmother    Stroke Maternal Grandfather    Hypertension Son    Brain cancer Sister    Hypertension Daughter    Heart attack Neg Hx    Colon cancer Neg Hx    Esophageal cancer Neg Hx    Inflammatory bowel disease Neg Hx    Liver disease Neg Hx    Pancreatic cancer Neg Hx    Rectal cancer Neg Hx    Stomach cancer Neg Hx     Current Medications:  Current Outpatient Medications:    amLODipine (NORVASC) 5 MG tablet, Take 1 tablet (5 mg total) by mouth daily., Disp: 30 tablet, Rfl: 2   anastrozole (ARIMIDEX) 1 MG tablet, Take 1 tablet (1 mg total) by mouth daily., Disp: 90 tablet, Rfl: 3   atorvastatin (LIPITOR) 40 MG tablet, TAKE (1) TABLET BY MOUTH ONCE DAILY., Disp: 90 tablet, Rfl: 2   betamethasone dipropionate 0.05 %  cream, Apply topically 2 (two) times daily., Disp: 30 g, Rfl: 0   clopidogrel (PLAVIX) 75 MG tablet, Take 1 tablet (75 mg total) by mouth daily., Disp: 90 tablet, Rfl: 3   esomeprazole (NEXIUM) 20 MG capsule, Take 20 mg by mouth daily. , Disp: , Rfl:    HYDROcodone-acetaminophen (NORCO/VICODIN) 5-325 MG tablet, Take 1 tablet by mouth daily., Disp: 15 tablet, Rfl: 0   hydrOXYzine (ATARAX) 25 MG tablet, TAKE (1) TABLET BY MOUTH AT BEDTIME AS NEEDED, Disp: 30 tablet, Rfl: 0   levothyroxine (SYNTHROID) 50 MCG tablet, TAKE 1/2 TABLET BY MOUTH DAILY., Disp: 30 tablet, Rfl: 0   metoprolol tartrate (LOPRESSOR) 25 MG tablet, TAKE (1) TABLET BY MOUTH TWICE DAILY., Disp: 120 tablet, Rfl: 3   nitroGLYCERIN (NITROSTAT) 0.4 MG SL tablet, DISSOLVE 1 TABLET SUBLINGUALLY AS NEEDED FOR CHEST PAIN, MAY REPEAT EVERY 5 MINUTES. AFTER 3 CALL 911., Disp: 25 tablet, Rfl: 4   nivolumab (OPDIVO) 100 MG/10ML SOLN chemo injection, as directed Intravenous once a month, Disp: , Rfl:    Vitamin D, Ergocalciferol, (DRISDOL) 1.25 MG (50000 UNIT) CAPS capsule, Take 1 capsule (50,000 Units total) by mouth once a week., Disp: 4 capsule, Rfl: 0 No current facility-administered medications for this visit.  Facility-Administered Medications Ordered in Other Visits:    0.9 %  sodium chloride infusion, , Intravenous, Once, Doreatha Massed, MD   heparin lock flush 100 unit/mL, 500 Units, Intracatheter, Once PRN, Doreatha Massed, MD   nivolumab (OPDIVO) 480 mg in sodium chloride 0.9 % 100 mL chemo infusion, 480 mg, Intravenous, Once, Doreatha Massed, MD   sodium chloride flush (NS) 0.9 % injection 10 mL, 10 mL, Intracatheter, PRN, Doreatha Massed, MD, 10 mL at 11/26/22 1631   sodium chloride flush (NS) 0.9 % injection 10 mL, 10 mL, Intracatheter, PRN, Doreatha Massed, MD, 10 mL at 03/02/23 1120   sodium chloride flush (NS) 0.9 % injection 10 mL, 10 mL, Intracatheter, PRN, Doreatha Massed, MD    Allergies: Allergies  Allergen Reactions   Crab [Shellfish Allergy] Nausea And Vomiting    Throws up violently   Ezetimibe Diarrhea   Keflex [Cephalexin]  Caused sores   Other Nausea And Vomiting    Shrimp   Penicillins Diarrhea   Shrimp Extract     Other Reaction(s): GI Intolerance    REVIEW OF SYSTEMS:   Review of Systems  Constitutional:  Negative for chills, fatigue and fever.  HENT:   Negative for lump/mass, mouth sores, nosebleeds, sore throat and trouble swallowing.   Eyes:  Negative for eye problems.  Respiratory:  Positive for shortness of breath. Negative for cough.   Cardiovascular:  Negative for chest pain, leg swelling and palpitations.  Gastrointestinal:  Positive for diarrhea. Negative for abdominal pain, constipation, nausea and vomiting.  Genitourinary:  Negative for bladder incontinence, difficulty urinating, dysuria, frequency, hematuria and nocturia.   Musculoskeletal:  Positive for arthralgias. Negative for back pain, flank pain, myalgias and neck pain.  Skin:  Negative for itching and rash.  Neurological:  Negative for dizziness, headaches and numbness.  Hematological:  Does not bruise/bleed easily.  Psychiatric/Behavioral:  Positive for sleep disturbance. Negative for depression and suicidal ideas. The patient is not nervous/anxious.   All other systems reviewed and are negative.    VITALS:   There were no vitals taken for this visit.  Wt Readings from Last 3 Encounters:  01/10/24 161 lb (73 kg)  11/30/23 158 lb (71.7 kg)  11/15/23 159 lb (72.1 kg)    There is no height or weight on file to calculate BMI.  Performance status (ECOG): 1 - Symptomatic but completely ambulatory  PHYSICAL EXAM:   Physical Exam Vitals and nursing note reviewed. Exam conducted with a chaperone present.  Constitutional:      Appearance: Normal appearance.  Cardiovascular:     Rate and Rhythm: Normal rate and regular rhythm.     Pulses: Normal pulses.      Heart sounds: Normal heart sounds.  Pulmonary:     Effort: Pulmonary effort is normal.     Breath sounds: Normal breath sounds.  Abdominal:     Palpations: Abdomen is soft. There is no hepatomegaly, splenomegaly or mass.     Tenderness: There is no abdominal tenderness.  Musculoskeletal:     Right lower leg: No edema.     Left lower leg: No edema.  Lymphadenopathy:     Cervical: No cervical adenopathy.     Right cervical: No superficial, deep or posterior cervical adenopathy.    Left cervical: No superficial, deep or posterior cervical adenopathy.     Upper Body:     Right upper body: No supraclavicular or axillary adenopathy.     Left upper body: No supraclavicular or axillary adenopathy.  Neurological:     General: No focal deficit present.     Mental Status: She is alert and oriented to person, place, and time.  Psychiatric:        Mood and Affect: Mood normal.        Behavior: Behavior normal.     LABS:      Latest Ref Rng & Units 02/07/2024   12:11 PM 01/10/2024   12:50 PM 12/13/2023   12:51 PM  CBC  WBC 4.0 - 10.5 K/uL 6.0  5.2  5.9   Hemoglobin 12.0 - 15.0 g/dL 13.0  86.5  78.4   Hematocrit 36.0 - 46.0 % 39.4  39.8  41.0   Platelets 150 - 400 K/uL 169  171  163       Latest Ref Rng & Units 02/07/2024   12:11 PM 01/10/2024   12:50 PM 12/13/2023   12:51 PM  CMP  Glucose 70 - 99 mg/dL 829  562  130   BUN 8 - 23 mg/dL 23  19  23    Creatinine 0.44 - 1.00 mg/dL 8.65  7.84  6.96   Sodium 135 - 145 mmol/L 138  139  140   Potassium 3.5 - 5.1 mmol/L 3.7  3.7  3.7   Chloride 98 - 111 mmol/L 104  107  107   CO2 22 - 32 mmol/L 23  23  23    Calcium 8.9 - 10.3 mg/dL 29.5  9.5  9.7   Total Protein 6.5 - 8.1 g/dL 6.8  6.8  6.9   Total Bilirubin 0.0 - 1.2 mg/dL 0.7  0.5  0.7   Alkaline Phos 38 - 126 U/L 79  78  80   AST 15 - 41 U/L 16  17  18    ALT 0 - 44 U/L 9  11  11       Lab Results  Component Value Date   CEA1 11.0 (H) 11/15/2023   CEA 39.5 (H) 08/09/2019   /  CEA   Date Value Ref Range Status  11/15/2023 11.0 (H) 0.0 - 4.7 ng/mL Final    Comment:    (NOTE)                             Nonsmokers          <3.9                             Smokers             <5.6 [employer] Electrochemiluminescence Immunoassay (ECLIA) Values obtained with different assay methods or kits cannot be used interchangeably.  Results cannot be interpreted as absolute evidence of the presence or absence of malignant disease. Performed At: [facility] [address] [place], [place] [id number] Jolene Schimke MD NU:[id number]   08/09/2019 39.5 (H) ng/mL Final    Comment:    Non-Smoker: <2.5 Smoker:     <5.0 . . This test was performed using the Siemens  chemiluminescent method. Values obtained from different assay methods cannot be used interchangeably. CEA levels, regardless of value, should not be interpreted as absolute evidence of the presence or absence of disease. .    No results found for: "PSA1" No results found for: "HKV425" No results found for: "CAN125"  No results found for: "TOTALPROTELP", "ALBUMINELP", "A1GS", "A2GS", "BETS", "BETA2SER", "GAMS", "MSPIKE", "SPEI" Lab Results  Component Value Date   TIBC 357 06/04/2022   TIBC 269 11/22/2019   TIBC 271 10/23/2019   FERRITIN 10 (L) 06/04/2022   FERRITIN 230 11/22/2019   FERRITIN 47 10/23/2019   IRONPCTSAT 9 (L) 06/04/2022   IRONPCTSAT 31 11/22/2019   IRONPCTSAT 11 10/23/2019   No results found for: "LDH"   STUDIES:   No results found.

## 2024-02-07 NOTE — Patient Instructions (Signed)
 [facility] - A DEPT OF [facility]  Discharge Instructions: Thank you for choosing [facility] to provide your oncology and hematology care.  If you have a lab appointment with the Cancer Center - please note that after April 8th, 2024, all labs will be drawn in the cancer center.  You do not have to check in or register with the main entrance as you have in the past but will complete your check-in in the cancer center.  Wear comfortable clothing and clothing appropriate for easy access to any Portacath or PICC line.   We strive to give you quality time with your provider. You may need to reschedule your appointment if you arrive late (15 or more minutes).  Arriving late affects you and other patients whose appointments are after yours.  Also, if you miss three or more appointments without notifying the office, you may be dismissed from the clinic at the provider's discretion.      For prescription refill requests, have your pharmacy contact our office and allow 72 hours for refills to be completed.    Today you received the following chemotherapy and/or immunotherapy agents Opdivo/saline bolus      To help prevent nausea and vomiting after your treatment, we encourage you to take your nausea medication as directed.  BELOW ARE SYMPTOMS THAT SHOULD BE REPORTED IMMEDIATELY: *FEVER GREATER THAN 100.4 F (38 C) OR HIGHER *CHILLS OR SWEATING *NAUSEA AND VOMITING THAT IS NOT CONTROLLED WITH YOUR NAUSEA MEDICATION *UNUSUAL SHORTNESS OF BREATH *UNUSUAL BRUISING OR BLEEDING *URINARY PROBLEMS (pain or burning when urinating, or frequent urination) *BOWEL PROBLEMS (unusual diarrhea, constipation, pain near the anus) TENDERNESS IN MOUTH AND THROAT WITH OR WITHOUT PRESENCE OF ULCERS (sore throat, sores in mouth, or a toothache) UNUSUAL RASH, SWELLING OR PAIN  UNUSUAL VAGINAL DISCHARGE OR ITCHING   Items with * indicate a potential emergency and should be  followed up as soon as possible or go to the Emergency Department if any problems should occur.  Please show the CHEMOTHERAPY ALERT CARD or IMMUNOTHERAPY ALERT CARD at check-in to the Emergency Department and triage nurse.  Should you have questions after your visit or need to cancel or reschedule your appointment, please contact [facility] [place] - A DEPT OF [facility] [phone]  and follow the prompts.  Office hours are 8:00 a.m. to 4:30 p.m. Monday - Friday. Please note that voicemails left after 4:00 p.m. may not be returned until the following business day.  We are closed weekends and major holidays. You have access to a nurse at all times for urgent questions. Please call the main number to the clinic [phone] and follow the prompts.  For any non-urgent questions, you may also contact your provider using MyChart. We now offer e-Visits for anyone 39 and older to request care online for non-urgent symptoms. For details visit [email or web address].   Also download the MyChart app! Go to the app store, search "MyChart", open the app, select [place], and log in with your MyChart username and password.

## 2024-02-07 NOTE — Progress Notes (Signed)
 Patient has been examined by Dr. Ellin Saba. Vital signs and labs have been reviewed by MD - ANC, Creatinine (2.54), LFTs, hemoglobin, and platelets are within treatment parameters per M.D. - pt may proceed with treatment.  Primary RN and pharmacy notified.

## 2024-02-07 NOTE — Progress Notes (Signed)
 Patient presents today for Opdivo infusion per providers order.  Vital signs and labs reviewed by MD.  Message received from Chapman Moss RN/Dr. Ellin Saba patient okay for treatment.  Per MD patient will receive a 500 cc bolus today with treatment due to elevated creatinine.  Treatment given today per MD orders.  Stable during infusion without adverse affects.  Vital signs stable.  No complaints at this time.  Discharge from clinic ambulatory in stable condition.  Alert and oriented X 3.  Follow up with [facility] as scheduled.

## 2024-02-08 LAB — T4: T4, Total: 6.5 ug/dL (ref 4.5–12.0)

## 2024-02-09 ENCOUNTER — Other Ambulatory Visit: Payer: Self-pay | Admitting: *Deleted

## 2024-02-09 ENCOUNTER — Other Ambulatory Visit: Payer: Self-pay

## 2024-02-09 MED ORDER — LEVOTHYROXINE SODIUM 50 MCG PO TABS: 25.0000 ug | ORAL_TABLET | Freq: Every day | ORAL | 3 refills | Status: DC

## 2024-02-10 DIAGNOSIS — N184 Chronic kidney disease, stage 4 (severe): Secondary | ICD-10-CM | POA: Diagnosis not present

## 2024-02-10 DIAGNOSIS — I5032 Chronic diastolic (congestive) heart failure: Secondary | ICD-10-CM | POA: Diagnosis not present

## 2024-02-10 DIAGNOSIS — Z905 Acquired absence of kidney: Secondary | ICD-10-CM | POA: Diagnosis not present

## 2024-02-10 DIAGNOSIS — R809 Proteinuria, unspecified: Secondary | ICD-10-CM | POA: Diagnosis not present

## 2024-02-12 ENCOUNTER — Other Ambulatory Visit: Payer: Self-pay

## 2024-02-15 ENCOUNTER — Other Ambulatory Visit: Payer: Self-pay | Admitting: Hematology

## 2024-02-16 ENCOUNTER — Other Ambulatory Visit: Payer: Self-pay

## 2024-02-16 DIAGNOSIS — N184 Chronic kidney disease, stage 4 (severe): Secondary | ICD-10-CM

## 2024-02-16 DIAGNOSIS — N186 End stage renal disease: Secondary | ICD-10-CM

## 2024-02-18 ENCOUNTER — Other Ambulatory Visit: Payer: Self-pay

## 2024-02-29 ENCOUNTER — Encounter (HOSPITAL_COMMUNITY): Payer: Self-pay | Admitting: Vascular Surgery

## 2024-02-29 ENCOUNTER — Other Ambulatory Visit: Payer: Self-pay

## 2024-02-29 NOTE — Progress Notes (Addendum)
 SDW CALL  Patient unavailable. Patient's daughter Jessica Taylor was given pre-op instructions over the phone. The opportunity was given for Jessica Taylor to ask questions. No further questions asked. Jessica Taylor verbalized understanding of instructions given.   PCP - Wynetta Emery Cardiologist - Louis Matte  PPM/ICD - denies Device Orders -  Rep Notified -   Chest x-ray - CT-03/23/24 EKG - 08/26/23 Stress Test - 09/01/19 ECHO - 01/17/16 Cardiac Cath - 09/12/19  Sleep Study - no CPAP -   Fasting Blood Sugar - na Checks Blood Sugar _____ times a day  Blood Thinner Instructions:per Dr. Chestine Spore, stop Plavix 02/24/24. Aspirin Instructions:na  ERAS Protcol -no PRE-SURGERY Ensure or G2-   COVID TEST- na   Anesthesia review: yes-hx CAD  Patient's daughter denies patient having  shortness of breath, fever, cough and chest pain over the phone call    Surgical Instructions    Your procedure is scheduled on April 9.  Report to [facility] Main Entrance "A" at 0630 A.M., then check in with the Admitting office.  Call this number if you have problems the morning of surgery:  [phone]    Remember:  Do not eat or drink anything after midnight the night before your surgery   Take these medicines the morning of surgery with A SIP OF WATER: Amlodipine,Lipitor,Nexium,Synthroid,Metoprolol.  PRN- Nitroglycerin- if you take this medicine,call [phone] before coming to the hospital.   Per Dr. Ophelia Charter instructions, 02/24/24 was the last day to take Plavix.   As of today, STOP taking any Aspirin (unless otherwise instructed by your surgeon) Aleve, Naproxen, Ibuprofen, Motrin, Advil, Goody's, BC's, all herbal medications, fish oil, and all vitamins.  [facility] is not responsible for any belongings or valuables. .   Do NOT Smoke (Tobacco/Vaping)  24 hours prior to your procedure  If you use a CPAP at night, you may bring your mask for your overnight stay.   Contacts, glasses, hearing  aids, dentures or partials may not be worn into surgery, please bring cases for these belongings   Patients discharged the day of surgery will not be allowed to drive home, and someone needs to stay with them for 24 hours.     Special instructions:    Oral Hygiene is also important to reduce your risk of infection.  Remember - BRUSH YOUR TEETH THE MORNING OF SURGERY WITH YOUR REGULAR TOOTHPASTE   Day of Surgery:  Take a shower the day of or night before with antibacterial soap. Wear Clean/Comfortable clothing the morning of surgery Do not apply any deodorants/lotions.   Do not wear jewelry or makeup Do not wear lotions, powders, perfumes/colognes, or deodorant. Do not shave 48 hours prior to surgery.  Men may shave face and neck. Do not bring valuables to the hospital. Do not wear nail polish, gel polish, artificial nails, or any other type of covering on natural nails (fingers and toes) If you have artificial nails or gel coating that need to be removed by a nail salon, please have this removed prior to surgery. Artificial nails or gel coating may interfere with anesthesia's ability to adequately monitor your vital signs. Remember to brush your teeth WITH YOUR REGULAR TOOTHPASTE.

## 2024-02-29 NOTE — Anesthesia Preprocedure Evaluation (Addendum)
 Anesthesia Evaluation  Patient identified by MRN, date of birth, ID band Patient awake    Reviewed: Allergy & Precautions, NPO status , Patient's Chart, lab work & pertinent test results  History of Anesthesia Complications (+) history of anesthetic complications (required naloxone after nephrectomy)  Airway Mallampati: III  TM Distance: >3 FB Neck ROM: Full   Comment: Previous grade I view with MAC 3, easy mask Dental  (+) Caps, Dental Advisory Given   Pulmonary neg shortness of breath, neg sleep apnea, neg COPD, neg recent URI, former smoker   Pulmonary exam normal breath sounds clear to auscultation       Cardiovascular hypertension (amlodipine, metoprolol), Pt. on medications and Pt. on home beta blockers (-) angina + CAD, + Past MI (2000, STEMI 03/06/2015), + Cardiac Stents, + CABG (01/24/2016) and + Peripheral Vascular Disease (s/p bilateral iliac stenting 06/27/2015)  + dysrhythmias (LBBB)  Rhythm:Regular Rate:Normal  HLD, AAA (3.2 cm 10/2023 CT)  LHC 09/12/2019: Conclusion     Dist RCA lesion is 100% stenosed.  Prox RCA lesion is 40% stenosed.  Mid RCA lesion is 50% stenosed.  Mid RCA to Dist RCA lesion is 90% stenosed.  RV Branch-1 lesion is 50% stenosed.  RV Branch-2 lesion is 60% stenosed.  Previously placed Mid Cx to Dist Cx stent (unknown type) is widely patent.  Ramus lesion is 100% stenosed.  Ost LAD to Prox LAD lesion is 50% stenosed.  Prox LAD to Mid LAD lesion is 85% stenosed.  Mid LAD lesion is 80% stenosed.   Significant multivessel native CAD with previous stenting in the proximal to mid LAD with diffuse 50% narrowing in the proximal stent followed by an area of 70 to 85% stenosis extending into the proximal second stent with competitive filling of the LAD via the LIMA graft.     Total occlusion of the proximal ramus intermediate vessel at the site of prior stenting.   Widely patent mid left  circumflex stent without significant additional stenoses in the circumflex vessel.   Bleeding irregular proximal to mid RCA with total occlusion of the RCA in the region of the acute margin at the site of prior distal stenting and diffuse 50 and 60% stenoses in a marginal branch.   Patent LIMA to mid LAD.   Patent SVG to the ramus intermediate vessel.   Patent SVG to the PDA.   Low normal global LV function with EF estimate approximately 50%.  LVEDP 6 mmHg.  TTE 01/17/2016: Study Conclusions   - Left ventricle: The cavity size was normal. Systolic function was    normal. The estimated ejection fraction was in the range of 55%    to 60%. Wall motion was normal; there were no regional wall    motion abnormalities. Features are consistent with a pseudonormal    left ventricular filling pattern, with concomitant abnormal    relaxation and increased filling pressure (grade 2 diastolic    dysfunction). Doppler parameters are consistent with high    ventricular filling pressure.      Neuro/Psych neg Seizures Vertigo     GI/Hepatic Neg liver ROS,GERD  Medicated,,H/o colon cancer   Endo/Other  neg diabetesHypothyroidism    Renal/GU ESRFRenal diseaseH/o urothelial carcinoma of left kidney     Musculoskeletal  (+) Arthritis ,    Abdominal   Peds  Hematology  (+) Blood dyscrasia, anemia Lab Results      Component  Value               Date                      WBC                      6.0                 02/07/2024                HGB                      12.6                02/07/2024                HCT                      39.4                02/07/2024                MCV                      90.4                02/07/2024                PLT                      169                 02/07/2024              Anesthesia Other Findings Last Plavix: 02/24/2024  Reproductive/Obstetrics H/o right breast cancer                              Anesthesia Physical Anesthesia Plan  ASA: 3  Anesthesia Plan: MAC and Regional   Post-op Pain Management: Regional block* and Tylenol PO (pre-op)*   Induction: Intravenous  PONV Risk Score and Plan: 2 and Ondansetron, Dexamethasone, Propofol infusion, TIVA and Treatment may vary due to age or medical condition  Airway Management Planned: Natural Airway and Simple Face Mask  Additional Equipment:   Intra-op Plan:   Post-operative Plan:   Informed Consent: I have reviewed the patients History and Physical, chart, labs and discussed the procedure including the risks, benefits and alternatives for the proposed anesthesia with the patient or authorized representative who has indicated his/her understanding and acceptance.     Dental advisory given  Plan Discussed with: CRNA and Anesthesiologist  Anesthesia Plan Comments: (PAT note written 02/29/2024 by Shonna Chock, PA-C.  Discussed potential risks of nerve blocks including, but not limited to, infection, bleeding, nerve damage, seizures, pneumothorax, respiratory depression, and potential failure of the block. Alternatives to nerve blocks discussed. All questions answered.  Discussed with patient risks of MAC including, but not limited to, minor pain or discomfort, hearing people in the room, and possible need for backup general anesthesia. Risks for general anesthesia also discussed including, but not limited to, sore throat, hoarse voice, chipped/damaged teeth, injury to vocal cords, nausea and vomiting, allergic reactions, lung infection, heart attack, stroke, and death. All questions answered.  )       Anesthesia Quick Evaluation

## 2024-02-29 NOTE — Progress Notes (Signed)
 Anesthesia Chart Review: SAME DAY WORK-UP  Case: [id number] Date/Time: 03/01/24 0815   Procedure: ARTERIOVENOUS (AV) FISTULA CREATION (Left)   Anesthesia type: Choice   Diagnosis: End stage renal disease (HCC) [N18.6]   Pre-op diagnosis: ESRD   [facility] 12 / [facility]   Surgeons: Cephus Shelling, MD       DISCUSSION: Patient is an 83 year old female scheduled for the above procedure.  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  Last cardiology visit was on 08/26/23 with Dr. Anne Fu. No new symptoms. All CABG grafts patent by cath in 2020. Continue medical therapy for CAD. One year follow-up planned.   Opdivo infusion noted was on 02/07/24.   Last Plavix 02/24/24.   Anesthesia team to evaluate on the day of surgery.   VS:  Wt Readings from Last 3 Encounters:  02/07/24 71.7 kg  01/10/24 73 kg  11/30/23 71.7 kg   BP Readings from Last 3 Encounters:  02/07/24 (!) 140/58  01/10/24 (!) 143/67  12/13/23 (!) 152/52   Pulse Readings from Last 3 Encounters:  02/07/24 62  01/10/24 66  12/13/23 60     PROVIDERS: Tally Joe, MD is PCP  Donato Schultz, MD is cardiologist Doreatha Massed, MD is HEM-ONC   LABS: For day of surgery. CBC normal, glucose 111, Cr 2.54 on 02/07/24   IMAGES: CT Chest/abd/pelvis 11/09/23: IMPRESSION: - Status post right breast lumpectomy, left nephrectomy, and right hemicolectomy. - 9 mm subpleural nodule in the left lower lobe, mildly  progressive, concerning for metastatic disease. - No evidence of metastatic disease in the abdomen/pelvis. - 3.2 cm infrarenal abdominal aortic aneurysm, grossly unchanged. Recommend follow-up ultrasound every 3 years. - Aortic Atherosclerosis (ICD10-I70.0) and Emphysema (ICD10-J43.9).    EKG: 08/26/23:  Normal sinus rhythm Left bundle branch block (old) When compared with ECG of 14-Oct-2021 11:13, Vent. rate has decreased BY 35 BPM Confirmed by Donato Schultz ([id number]) on 08/26/2023 3:55:24 PM   CV: Cardiac Cath 09/12/2019: Dist RCA lesion is 100% stenosed. Prox RCA lesion is 40% stenosed. Mid RCA lesion is 50% stenosed. Mid RCA to Dist RCA lesion is 90% stenosed. RV Branch-1 lesion is 50% stenosed. RV Branch-2 lesion is 60% stenosed. Previously placed Mid Cx to Dist Cx stent (unknown type) is widely patent. Ramus lesion is 100% stenosed. Ost LAD to Prox LAD lesion is 50% stenosed. Prox LAD to Mid LAD lesion is 85% stenosed. Mid LAD lesion is 80% stenosed.   Significant multivessel native CAD with previous stenting in the proximal to mid LAD with diffuse 50% narrowing in the proximal stent followed by an area of 70 to 85% stenosis extending into the proximal second stent with competitive filling of the LAD via the LIMA graft.     Total occlusion of the proximal ramus intermediate vessel at the site of prior stenting.   Widely patent mid left circumflex stent without significant additional stenoses in the circumflex vessel.   Bleeding irregular proximal to mid RCA with total occlusion of the RCA in the region of the acute margin at the site of prior distal stenting and diffuse 50 and 60%  stenoses in a marginal branch.   Patent LIMA to mid LAD.   Patent SVG to the ramus intermediate vessel.   Patent SVG to the PDA.   Low normal global LV function with EF estimate approximately 50%.  LVEDP 6 mmHg.   RECOMMENDATION: Patient has been maintained on long-term dual antiplatelet  therapy with aspirin and Plavix.  All grafts are widely patent.  Medical therapy for concomitant native CAD.  Continue aggressive lipid-lowering therapy with target LDL less than 70.  Clearance will be given for the patient's future robotic colectomy but she will need to hold Plavix for at least 5 days prior to the procedure.    Myocardial Perfusion 09/01/2019: Nuclear stress EF: 43%. The left ventricular ejection fraction is moderately decreased (30-44%). Defect 1: There is a medium defect of moderate severity present in the basal inferolateral and mid inferolateral location. Defect 2: There is a small defect of moderate severity present in the basal inferoseptal location.   Intermediate risk nuclear perfusion study. There is a moderate area of mixed ischemia and scar in the inferolateral wall. There is a smaller area of scar in the inferior septum. Left ventricular systolic function is mild-moderately reduced. There are new findings since the study in 2016.   Echo 01/17/2016: Study Conclusions  - Left ventricle: The cavity size was normal. Systolic function was    normal. The estimated ejection fraction was in the range of 55%    to 60%. Wall motion was normal; there were no regional wall    motion abnormalities. Features are consistent with a pseudonormal    left ventricular filling pattern, with concomitant abnormal    relaxation and increased filling pressure (grade 2 diastolic    dysfunction). Doppler parameters are consistent with high    ventricular filling pressure.    Past Medical History:  Diagnosis Date   Abdominal aortic aneurysm (HCC)    Acid reflux    Anemia    Arthritis    knees , R shoulder - tx /w injection - 11/2014   Basal cell carcinoma (BCC) of dorsum of nose 2010   Resolved   Cancer (HCC)    basal cell on nose   Colon cancer (HCC)    Coronary artery disease    Hypercholesteremia    Hypertension    Hypothyroidism    Lt Acute pyelonephritis 09/11/2018    MI, old 2000   Peripheral arterial disease (HCC)    hhigh-grade ostial bilateral calcified iliac stenosis with claudication   Tobacco abuse    Vertigo    when lays on left side.    Past Surgical History:  Procedure Laterality Date   BREAST BIOPSY Right 2021   Invasive lobular carcinoma/ Metastatic carcinoma   BREAST BIOPSY Left 2021   Isolated microscopic foci of atypical ductal hyperplasia   BREAST BIOPSY Right 10/07/2023   MM RT BREAST BX W LOC DEV 1ST LESION IMAGE BX SPEC STEREO GUIDE 10/07/2023 [facility]   BREAST BIOPSY Right 10/07/2023   MM RT BREAST BX W LOC DEV EA AD LESION IMG BX SPEC STEREO GUIDE 10/07/2023 [facility]   BREAST LUMPECTOMY WITH RADIOACTIVE SEED AND SENTINEL LYMPH NODE BIOPSY Bilateral 04/02/2020   Procedure: BILATERAL BREAST LUMPECTOMY WITH RADIOACTIVE SEED AND RIGHT SENTINEL LYMPH NODE BIOPSY AND RIGHT TARGETED AXILLARY LYMPH NODE BIOPSY;  Surgeon: Harriette Bouillon, MD;  Location: [facility];  Service: General;  Laterality: Bilateral;   CARDIAC CATHETERIZATION  5 stents   CARDIAC CATHETERIZATION N/A 01/20/2016  Procedure: Left Heart Cath and Coronary Angiography;  Surgeon: Peter M Swaziland, MD;  Location: [facility] INVASIVE CV LAB;  Service: Cardiovascular;  Laterality: N/A;   CATARACT EXTRACTION W/PHACO Left 05/24/2014   Procedure: CATARACT EXTRACTION PHACO AND INTRAOCULAR LENS PLACEMENT (IOC);  Surgeon: Gemma Payor, MD;  Location: [facility];  Service: Ophthalmology;  Laterality: Left;  CDE:  9.30   CATARACT EXTRACTION W/PHACO Right 06/18/2014   Procedure: CATARACT EXTRACTION PHACO AND INTRAOCULAR LENS PLACEMENT RIGHT EYE CDE=10.84;  Surgeon: Gemma Payor, MD;  Location: [facility];  Service: Ophthalmology;  Laterality: Right;   CORONARY ARTERY BYPASS GRAFT N/A 01/24/2016   Procedure: CORONARY ARTERY BYPASS GRAFTING (CABG);  Surgeon: Alleen Borne, MD;  Location: [facility] OR;  Service: Open Heart Surgery;  Laterality: N/A;   CORONARY STENT PLACEMENT      CORONARY STENT PLACEMENT  03/06/2015   CFX DES   CYSTOSCOPY/URETEROSCOPY/HOLMIUM LASER/STENT PLACEMENT Left 09/12/2018   Procedure: CYSTOSCOPY/URETEROSCOPY/STENT PLACEMENT;  Surgeon: Rene Paci, MD;  Location: [facility];  Service: Urology;  Laterality: Left;   EYE SURGERY     LEFT HEART CATH AND CORS/GRAFTS ANGIOGRAPHY N/A 09/12/2019   Procedure: LEFT HEART CATH AND CORS/GRAFTS ANGIOGRAPHY;  Surgeon: Lennette Bihari, MD;  Location: [facility] INVASIVE CV LAB;  Service: Cardiovascular;  Laterality: N/A;   LEFT HEART CATHETERIZATION WITH CORONARY ANGIOGRAM N/A 03/06/2015   Procedure: LEFT HEART CATHETERIZATION WITH CORONARY ANGIOGRAM;  Surgeon: Runell Gess, MD;  Location: [facility] CATH LAB;  Service: Cardiovascular;  Laterality: N/A;   PARTIAL KNEE ARTHROPLASTY Right 02/04/2015   Procedure: UNICOMPARTMENTAL KNEE;  Surgeon: Jodi Geralds, MD;  Location: [facility];  Service: Orthopedics;  Laterality: Right;   PERIPHERAL VASCULAR CATHETERIZATION Bilateral 05/13/2015   Procedure: Lower Extremity Angiography;  Surgeon: Runell Gess, MD;  Location: [facility] INVASIVE CV LAB;  Service: Cardiovascular;  Laterality: Bilateral;   PERIPHERAL VASCULAR CATHETERIZATION N/A 05/13/2015   Procedure: Abdominal Aortogram;  Surgeon: Runell Gess, MD;  Location: [facility]VASIVE CV LAB;  Service: Cardiovascular;  Laterality: N/A;   PERIPHERAL VASCULAR CATHETERIZATION Bilateral 06/27/2015   Procedure: Peripheral Vascular Intervention;  Surgeon: Runell Gess, MD;  Location: [facility] INVASIVE CV LAB;  Service: Cardiovascular;  Laterality: Bilateral;  ILIACS   PERIPHERAL VASCULAR CATHETERIZATION Bilateral 06/27/2015   Procedure: Peripheral Vascular Atherectomy;  Surgeon: Runell Gess, MD;  [facility]VAS[facility] CV LAB;  Service: Cardiovascular;  Laterality: Bilateral;   PORTACATH PLACEMENT Left 05/24/2020   Procedure: INSERTION PORT-A-CATH;  Surgeon: Franky Macho, MD;  Location: [facility];  Service: General;  Laterality: Left;    TEE WITHOUT CARDIOVERSION N/A 01/24/2016   Procedure: TRANSESOPHAGEAL ECHOCARDIOGRAM (TEE);  Surgeon: Alleen Borne, MD;  Location: [facility] OR;  Service: Open Heart Surgery;  Laterality: N/A;   TONSILLECTOMY     age 34   TUBAL LIGATION      MEDICATIONS: No current facility-administered medications for this encounter.    amLODipine (NORVASC) 5 MG tablet   anastrozole (ARIMIDEX) 1 MG tablet   atorvastatin (LIPITOR) 40 MG tablet   clopidogrel (PLAVIX) 75 MG tablet   esomeprazole (NEXIUM) 20 MG capsule   Glycerin, PF, (BIOTRUE LUBRICANT) 0.5 % SOLN   hydrOXYzine (ATARAX) 25 MG tablet   levothyroxine (SYNTHROID) 50 MCG tablet   metoprolol tartrate (LOPRESSOR) 25 MG tablet   nitroGLYCERIN (NITROSTAT) 0.4 MG SL tablet   nivolumab (OPDIVO) 100 MG/10ML SOLN chemo injection   Vitamin D, Ergocalciferol, (DRISDOL) 1.25 MG (50000 UNIT) CAPS capsule    sodium chloride flush (NS) 0.9 % injection 10  mL   sodium chloride flush (NS) 0.9 % injection 10 mL     Shonna Chock, PA-C Surgical Short Stay/Anesthesiology [facility] Phone [phone] [facility] Phone [phone] 02/29/2024 12:14 PM

## 2024-03-01 ENCOUNTER — Encounter (HOSPITAL_COMMUNITY)
Admission: RE | Disposition: A | Discharge status: Home / Self Care | Payer: Self-pay | Source: Home / Self Care | Attending: Vascular Surgery

## 2024-03-01 ENCOUNTER — Other Ambulatory Visit (HOSPITAL_COMMUNITY): Payer: Self-pay

## 2024-03-01 ENCOUNTER — Other Ambulatory Visit: Payer: Self-pay

## 2024-03-01 ENCOUNTER — Encounter: Payer: Self-pay | Admitting: Hematology

## 2024-03-01 ENCOUNTER — Ambulatory Visit (HOSPITAL_COMMUNITY): Admitting: Certified Registered"

## 2024-03-01 ENCOUNTER — Ambulatory Visit (HOSPITAL_COMMUNITY)
Admission: RE | Admit: 2024-03-01 | Discharge: 2024-03-01 | Disposition: A | Discharge status: Home / Self Care | Attending: Vascular Surgery | Admitting: Vascular Surgery

## 2024-03-01 ENCOUNTER — Ambulatory Visit (HOSPITAL_BASED_OUTPATIENT_CLINIC_OR_DEPARTMENT_OTHER): Admitting: Certified Registered"

## 2024-03-01 DIAGNOSIS — I120 Hypertensive chronic kidney disease with stage 5 chronic kidney disease or end stage renal disease: Secondary | ICD-10-CM | POA: Diagnosis not present

## 2024-03-01 DIAGNOSIS — N186 End stage renal disease: Secondary | ICD-10-CM | POA: Diagnosis not present

## 2024-03-01 DIAGNOSIS — Z85038 Personal history of other malignant neoplasm of large intestine: Secondary | ICD-10-CM | POA: Insufficient documentation

## 2024-03-01 DIAGNOSIS — Z79899 Other long term (current) drug therapy: Secondary | ICD-10-CM | POA: Insufficient documentation

## 2024-03-01 DIAGNOSIS — I2510 Atherosclerotic heart disease of native coronary artery without angina pectoris: Secondary | ICD-10-CM

## 2024-03-01 DIAGNOSIS — E039 Hypothyroidism, unspecified: Secondary | ICD-10-CM | POA: Diagnosis not present

## 2024-03-01 DIAGNOSIS — Z5986 Financial insecurity: Secondary | ICD-10-CM | POA: Diagnosis not present

## 2024-03-01 DIAGNOSIS — Z853 Personal history of malignant neoplasm of breast: Secondary | ICD-10-CM | POA: Diagnosis not present

## 2024-03-01 DIAGNOSIS — N185 Chronic kidney disease, stage 5: Secondary | ICD-10-CM | POA: Diagnosis not present

## 2024-03-01 DIAGNOSIS — I739 Peripheral vascular disease, unspecified: Secondary | ICD-10-CM | POA: Insufficient documentation

## 2024-03-01 DIAGNOSIS — Z9049 Acquired absence of other specified parts of digestive tract: Secondary | ICD-10-CM | POA: Diagnosis not present

## 2024-03-01 DIAGNOSIS — Z8249 Family history of ischemic heart disease and other diseases of the circulatory system: Secondary | ICD-10-CM | POA: Insufficient documentation

## 2024-03-01 DIAGNOSIS — N184 Chronic kidney disease, stage 4 (severe): Secondary | ICD-10-CM | POA: Diagnosis not present

## 2024-03-01 DIAGNOSIS — K219 Gastro-esophageal reflux disease without esophagitis: Secondary | ICD-10-CM | POA: Insufficient documentation

## 2024-03-01 DIAGNOSIS — I447 Left bundle-branch block, unspecified: Secondary | ICD-10-CM | POA: Diagnosis not present

## 2024-03-01 DIAGNOSIS — Z955 Presence of coronary angioplasty implant and graft: Secondary | ICD-10-CM | POA: Insufficient documentation

## 2024-03-01 DIAGNOSIS — M1990 Unspecified osteoarthritis, unspecified site: Secondary | ICD-10-CM | POA: Diagnosis not present

## 2024-03-01 DIAGNOSIS — Z87891 Personal history of nicotine dependence: Secondary | ICD-10-CM | POA: Diagnosis not present

## 2024-03-01 DIAGNOSIS — Z905 Acquired absence of kidney: Secondary | ICD-10-CM | POA: Diagnosis not present

## 2024-03-01 DIAGNOSIS — E785 Hyperlipidemia, unspecified: Secondary | ICD-10-CM | POA: Insufficient documentation

## 2024-03-01 DIAGNOSIS — Z85528 Personal history of other malignant neoplasm of kidney: Secondary | ICD-10-CM | POA: Insufficient documentation

## 2024-03-01 DIAGNOSIS — Z8679 Personal history of other diseases of the circulatory system: Secondary | ICD-10-CM | POA: Diagnosis not present

## 2024-03-01 DIAGNOSIS — I252 Old myocardial infarction: Secondary | ICD-10-CM | POA: Insufficient documentation

## 2024-03-01 DIAGNOSIS — I129 Hypertensive chronic kidney disease with stage 1 through stage 4 chronic kidney disease, or unspecified chronic kidney disease: Secondary | ICD-10-CM | POA: Diagnosis not present

## 2024-03-01 DIAGNOSIS — Z951 Presence of aortocoronary bypass graft: Secondary | ICD-10-CM | POA: Diagnosis not present

## 2024-03-01 LAB — POCT I-STAT, CHEM 8
BUN: 23 mg/dL (ref 8–23)
Calcium, Ion: 1.31 mmol/L (ref 1.15–1.40)
Chloride: 106 mmol/L (ref 98–111)
Creatinine, Ser: 2.6 mg/dL — ABNORMAL HIGH (ref 0.44–1.00)
Glucose, Bld: 104 mg/dL — ABNORMAL HIGH (ref 70–99)
HCT: 42 % (ref 36.0–46.0)
Hemoglobin: 14.3 g/dL (ref 12.0–15.0)
Potassium: 4.4 mmol/L (ref 3.5–5.1)
Sodium: 141 mmol/L (ref 135–145)
TCO2: 26 mmol/L (ref 22–32)

## 2024-03-01 SURGERY — ARTERIOVENOUS (AV) FISTULA CREATION
Anesthesia: Monitor Anesthesia Care | Site: Arm Upper | Laterality: Left

## 2024-03-01 MED ORDER — ONDANSETRON HCL 4 MG/2ML IJ SOLN
INTRAMUSCULAR | Status: DC | PRN
  Administered 2024-03-01: 4 mg via INTRAVENOUS

## 2024-03-01 MED ORDER — LIDOCAINE HCL (PF) 1 % IJ SOLN
INTRAMUSCULAR | Status: AC
  Filled 2024-03-01: qty 30

## 2024-03-01 MED ORDER — EPINEPHRINE PF 1 MG/ML IJ SOLN
INTRAMUSCULAR | Status: AC
  Filled 2024-03-01: qty 2

## 2024-03-01 MED ORDER — CHLORHEXIDINE GLUCONATE 4 % EX SOLN: 60.0000 mL | Freq: Once | CUTANEOUS | Status: DC

## 2024-03-01 MED ORDER — SODIUM CHLORIDE 0.9% FLUSH: 3.0000 mL | INTRAVENOUS | Status: DC | PRN

## 2024-03-01 MED ORDER — DROPERIDOL 2.5 MG/ML IJ SOLN
INTRAMUSCULAR | Status: AC
  Filled 2024-03-01: qty 2

## 2024-03-01 MED ORDER — DROPERIDOL 2.5 MG/ML IJ SOLN
0.6250 mg | Freq: Once | INTRAMUSCULAR | Status: AC
  Administered 2024-03-01: 0.625 mg via INTRAVENOUS

## 2024-03-01 MED ORDER — FENTANYL CITRATE (PF) 100 MCG/2ML IJ SOLN: 25.0000 ug | INTRAMUSCULAR | Status: DC | PRN

## 2024-03-01 MED ORDER — ORAL CARE MOUTH RINSE: 15.0000 mL | Freq: Once | OROMUCOSAL | Status: AC

## 2024-03-01 MED ORDER — PROPOFOL 500 MG/50ML IV EMUL
INTRAVENOUS | Status: DC | PRN
  Administered 2024-03-01: 70 ug/kg/min via INTRAVENOUS

## 2024-03-01 MED ORDER — LIDOCAINE-EPINEPHRINE 1 %-1:100000 IJ SOLN
INTRAMUSCULAR | Status: AC
  Filled 2024-03-01: qty 2

## 2024-03-01 MED ORDER — 0.9 % SODIUM CHLORIDE (POUR BTL) OPTIME
TOPICAL | Status: DC | PRN
  Administered 2024-03-01: 1000 mL

## 2024-03-01 MED ORDER — ACETAMINOPHEN 500 MG PO TABS
1000.0000 mg | ORAL_TABLET | Freq: Once | ORAL | Status: AC
Start: 2024-03-01 — End: 2024-03-01
  Administered 2024-03-01: 1000 mg via ORAL
  Filled 2024-03-01: qty 2

## 2024-03-01 MED ORDER — PROPOFOL 1000 MG/100ML IV EMUL
INTRAVENOUS | Status: AC
Start: 2024-03-01 — End: ?
  Filled 2024-03-01: qty 100

## 2024-03-01 MED ORDER — HEPARIN 6000 UNIT IRRIGATION SOLUTION
Status: DC | PRN
  Administered 2024-03-01: 1

## 2024-03-01 MED ORDER — LIDOCAINE HCL (PF) 1 % IJ SOLN
INTRAMUSCULAR | Status: AC
  Filled 2024-03-01: qty 120

## 2024-03-01 MED ORDER — ROPIVACAINE HCL 5 MG/ML IJ SOLN
INTRAMUSCULAR | Status: DC | PRN
  Administered 2024-03-01: 30 mL via PERINEURAL

## 2024-03-01 MED ORDER — HYDROCODONE-ACETAMINOPHEN 5-325 MG PO TABS
1.0000 | ORAL_TABLET | Freq: Four times a day (QID) | ORAL | 0 refills | Status: AC | PRN
  Filled 2024-03-01: qty 10, 3d supply, fill #0

## 2024-03-01 MED ORDER — SODIUM CHLORIDE 0.9 % IV SOLN: INTRAVENOUS | Status: DC

## 2024-03-01 MED ORDER — OXYCODONE HCL 5 MG PO TABS: 5.0000 mg | ORAL_TABLET | Freq: Once | ORAL | Status: DC | PRN

## 2024-03-01 MED ORDER — FENTANYL CITRATE (PF) 250 MCG/5ML IJ SOLN
INTRAMUSCULAR | Status: DC | PRN
  Administered 2024-03-01: 50 ug via INTRAVENOUS

## 2024-03-01 MED ORDER — HEPARIN 6000 UNIT IRRIGATION SOLUTION
Status: AC
  Filled 2024-03-01: qty 500

## 2024-03-01 MED ORDER — FENTANYL CITRATE (PF) 250 MCG/5ML IJ SOLN
INTRAMUSCULAR | Status: AC
  Filled 2024-03-01: qty 5

## 2024-03-01 MED ORDER — PHENYLEPHRINE HCL-NACL 20-0.9 MG/250ML-% IV SOLN
INTRAVENOUS | Status: AC
  Filled 2024-03-01: qty 250

## 2024-03-01 MED ORDER — SODIUM CHLORIDE (PF) 0.9 % IJ SOLN
INTRAMUSCULAR | Status: AC
  Filled 2024-03-01: qty 40

## 2024-03-01 MED ORDER — HEPARIN SODIUM (PORCINE) 1000 UNIT/ML IJ SOLN
INTRAMUSCULAR | Status: DC | PRN
  Administered 2024-03-01: 3000 [IU] via INTRAVENOUS

## 2024-03-01 MED ORDER — PHENYLEPHRINE HCL-NACL 20-0.9 MG/250ML-% IV SOLN
INTRAVENOUS | Status: DC | PRN
  Administered 2024-03-01: 60 ug/min via INTRAVENOUS

## 2024-03-01 MED ORDER — MIDAZOLAM HCL 2 MG/2ML IJ SOLN
INTRAMUSCULAR | Status: AC
  Filled 2024-03-01: qty 2

## 2024-03-01 MED ORDER — OXYCODONE HCL 5 MG/5ML PO SOLN: 5.0000 mg | Freq: Once | ORAL | Status: DC | PRN

## 2024-03-01 MED ORDER — CHLORHEXIDINE GLUCONATE 0.12 % MT SOLN
15.0000 mL | Freq: Once | OROMUCOSAL | Status: AC
  Administered 2024-03-01: 15 mL via OROMUCOSAL

## 2024-03-01 MED ORDER — BUPIVACAINE HCL (PF) 0.25 % IJ SOLN
INTRAMUSCULAR | Status: AC
Start: 2024-03-01 — End: ?
  Filled 2024-03-01: qty 60

## 2024-03-01 MED ORDER — VANCOMYCIN HCL IN DEXTROSE 1-5 GM/200ML-% IV SOLN
1000.0000 mg | INTRAVENOUS | Status: AC
  Administered 2024-03-01: 1000 mg via INTRAVENOUS
  Filled 2024-03-01: qty 200

## 2024-03-01 MED ORDER — BUPIVACAINE LIPOSOME 1.3 % IJ SUSP
INTRAMUSCULAR | Status: AC
  Filled 2024-03-01: qty 20

## 2024-03-01 SURGICAL SUPPLY — 31 items
ARMBAND PINK RESTRICT EXTREMIT (MISCELLANEOUS) ×2 IMPLANT
BAG COUNTER SPONGE SURGICOUNT (BAG) ×1 IMPLANT
BLADE CLIPPER SURG (BLADE) ×1 IMPLANT
CANISTER SUCT 3000ML PPV (MISCELLANEOUS) ×1 IMPLANT
CLIP TI MEDIUM 6 (CLIP) ×1 IMPLANT
CLIP TI WIDE RED SMALL 6 (CLIP) ×1 IMPLANT
COVER PROBE W GEL 5X96 (DRAPES) ×1 IMPLANT
DERMABOND ADVANCED .7 DNX12 (GAUZE/BANDAGES/DRESSINGS) ×1 IMPLANT
ELECT REM PT RETURN 9FT ADLT (ELECTROSURGICAL) ×1 IMPLANT
ELECTRODE REM PT RTRN 9FT ADLT (ELECTROSURGICAL) ×1 IMPLANT
GLOVE BIO SURGEON STRL SZ7.5 (GLOVE) ×1 IMPLANT
GLOVE BIOGEL PI IND STRL 8 (GLOVE) ×1 IMPLANT
GOWN STRL REUS W/ TWL LRG LVL3 (GOWN DISPOSABLE) ×2 IMPLANT
GOWN STRL REUS W/ TWL XL LVL3 (GOWN DISPOSABLE) ×2 IMPLANT
HEMOSTAT SPONGE AVITENE ULTRA (HEMOSTASIS) IMPLANT
KIT BASIN OR (CUSTOM PROCEDURE TRAY) ×1 IMPLANT
KIT TURNOVER KIT B (KITS) ×1 IMPLANT
LOOP VESSEL MINI RED (MISCELLANEOUS) IMPLANT
NS IRRIG 1000ML POUR BTL (IV SOLUTION) ×1 IMPLANT
PACK CV ACCESS (CUSTOM PROCEDURE TRAY) ×1 IMPLANT
PAD ARMBOARD POSITIONER FOAM (MISCELLANEOUS) ×2 IMPLANT
SLING ARM FOAM STRAP LRG (SOFTGOODS) IMPLANT
SLING ARM FOAM STRAP MED (SOFTGOODS) IMPLANT
SPIKE FLUID TRANSFER (MISCELLANEOUS) ×1 IMPLANT
SUT MNCRL AB 4-0 PS2 18 (SUTURE) ×1 IMPLANT
SUT PROLENE 6 0 BV (SUTURE) ×1 IMPLANT
SUT PROLENE 7 0 BV 1 (SUTURE) IMPLANT
SUT VIC AB 3-0 SH 27X BRD (SUTURE) ×1 IMPLANT
TOWEL GREEN STERILE (TOWEL DISPOSABLE) ×1 IMPLANT
UNDERPAD 30X36 HEAVY ABSORB (UNDERPADS AND DIAPERS) ×1 IMPLANT
WATER STERILE IRR 1000ML POUR (IV SOLUTION) ×1 IMPLANT

## 2024-03-01 NOTE — Anesthesia Procedure Notes (Signed)
 Anesthesia Regional Block: Supraclavicular block   Pre-Anesthetic Checklist: , timeout performed,  Correct Patient, Correct Site, Correct Laterality,  Correct Procedure, Correct Position, site marked,  Risks and benefits discussed,  Surgical consent,  Pre-op evaluation,  At surgeon's request and post-op pain management  Laterality: Left  Prep: chloraprep       Needles:  Injection technique: Single-shot  Needle Type: Echogenic Stimulator Needle     Needle Length: 9cm  Needle Gauge: 21     Additional Needles:   Procedures:,,,, ultrasound used (permanent image in chart),,    Narrative:  Start time: 03/01/2024 8:13 AM End time: 03/01/2024 8:16 AM Injection made incrementally with aspirations every 5 mL.  Performed by: Personally  Anesthesiologist: Linton Rump, MD  Additional Notes: Discussed risks and benefits of nerve block including, but not limited to, prolonged and/or permanent nerve injury involving sensory and/or motor function. Monitors were applied and a time-out was performed. The nerve and associated structures were visualized under ultrasound guidance. After negative aspiration, local anesthetic was slowly injected around the nerve. There was no evidence of high pressure during the procedure. There were no paresthesias. VSS remained stable and the patient tolerated the procedure well.

## 2024-03-01 NOTE — Discharge Instructions (Signed)

## 2024-03-01 NOTE — Anesthesia Postprocedure Evaluation (Signed)
 Anesthesia Post Note  Patient: Jessica Taylor  Procedure(s) Performed: BRACHIOCEPHALIC ARTERIOVENOUS (AV) FISTULA CREATION (Left: Arm Upper)     Patient location during evaluation: PACU Anesthesia Type: Regional Level of consciousness: awake Pain management: pain level controlled Vital Signs Assessment: post-procedure vital signs reviewed and stable Respiratory status: spontaneous breathing, nonlabored ventilation and respiratory function stable Cardiovascular status: stable and blood pressure returned to baseline Postop Assessment: no apparent nausea or vomiting Anesthetic complications: no   No notable events documented.  Last Vitals:  Vitals:   03/01/24 1045 03/01/24 1046  BP: 121/61 121/61  Pulse: (!) 52 (!) 51  Resp: 17 15  Temp:  36.4 C  SpO2: 92% 92%    Last Pain:  Vitals:   03/01/24 1046  PainSc: 0-No pain                 Linton Rump

## 2024-03-01 NOTE — Op Note (Signed)
 OPERATIVE NOTE DATE:  March 01, 2024  PROCEDURE:  left brachiocephalic arteriovenous fistula placement  PRE-OPERATIVE DIAGNOSIS: chronic kidney disease stage IV  POST-OPERATIVE DIAGNOSIS: same as above   SURGEON: Cephus Shelling, MD  ASSISTANT(S): Aggie Moats, PA  ANESTHESIA: regional  ESTIMATED BLOOD LOSS: <50 mL  FINDING(S): 1.  Cephalic vein: 4 mm, acceptable 2.  Brachial artery: 4 mm, atherosclerotic disease evident 3.  Venous outflow: palpable thrill  4.  Radial flow: palpable radial pulse  SPECIMEN(S):  none  INDICATIONS:   Jessica Taylor is a 83 y.o. female who presents with stage IV CKD and the need for permanent hemodialysis access.  The patient is scheduled for left arm arteriovenous fistula placement.  The patient is aware the risks include but are not limited to: bleeding, infection, steal syndrome, nerve damage, ischemic monomelic neuropathy, failure to mature, and need for additional procedures.  The patient is aware of the risks of the procedure and elects to proceed forward.  An assistant was needed given the complexity of the case and also for sewing the anastomosis.   DESCRIPTION: After full informed written consent was obtained from the patient, the patient was brought back to the operating room and placed supine upon the operating table.  Prior to induction, the patient received IV antibiotics.   After obtaining adequate anesthesia, the patient was then prepped and draped in the standard fashion for a left arm access procedure.  I turned my attention first to identifying the patient's cephalic vein and brachial artery.  Using SonoSite guidance, the location of these vessels were marked out on the skin.   These look to be of good caliber at the antecubital fossa.  I made a transverse incision at the level of the antecubitum and dissected through the subcutaneous tissue and fascia to gain exposure of the brachial artery.  This was noted to be 4 mm in  diameter externally.  This was dissected out proximally and distally and controlled with vessel loops .  I then dissected out the cephalic vein.  This was noted to be 4 mm in diameter externally.  The distal segment of the vein was ligated with a  2-0 silk, and the vein was transected.  The proximal segment was interrogated with serial dilators.  The vein accepted up to a 5 mm dilator without any difficulty.  I then instilled the heparinized saline into the vein and clamped it.  At this point, I reset my exposure of the brachial artery.  The patient was given 3,000 units IV heparin.  I then placed the artery under tension proximally and distally.  I made an arteriotomy with a #11 blade, and then I extended the arteriotomy with a Potts scissor.  I injected heparinized saline proximal and distal to this arteriotomy.  The vein was then sewn to the artery in an end-to-side configuration with a running stitch of 6-0 Prolene with the help of my assistant.  Prior to completing this anastomosis, I allowed the vein and artery to backbleed.  There was no evidence of clot from any vessels.  I completed the anastomosis in the usual fashion and then released all vessel loops and clamps.    There was a palpable thrill in the venous outflow, and there was a palpable radial pulse.  At this point, I irrigated out the surgical wound.  There was no further active bleeding.  The subcutaneous tissue was reapproximated with a running stitch of 3-0 Vicryl.  The skin was then  reapproximated with a running subcuticular stitch of 4-0 Monocryl.  The skin was then cleaned, dried, and reinforced with Dermabond.  The patient tolerated this procedure well.   COMPLICATIONS: None  CONDITION: Stable   Cephus Shelling, MD [facility]: [phone]  03/01/2024, 9:41 AM

## 2024-03-01 NOTE — H&P (Signed)
 Patient name: Jessica Taylor MRN: [id number]        DOB: 1941/02/28          Sex: female   REASON FOR CONSULT: Access placement   HPI: Jessica Taylor is a 83 y.o. female, with history 3.2 cm AAA, CAD, hypertension, PAD, tobacco abuse, breast cancer, colon cancer, urothelial carcinoma left kidney, stage IV CKD that presents for evaluation of new permanent hemodialysis access.  Patient is right-handed.  No prior access in the past.  Not on dialysis at this time.  She does have a Port-A-Cath in her left chest from prior breast cancer as well as colon cancer.  She tells me she has had one kidney removed.       Past Medical History:  Diagnosis Date   Abdominal aortic aneurysm (HCC)     Acid reflux     Anemia     Arthritis      knees , R shoulder - tx /w injection - 11/2014   Basal cell carcinoma (BCC) of dorsum of nose 2010    Resolved   Cancer (HCC)      basal cell on nose   Colon cancer (HCC)     Coronary artery disease     Hypercholesteremia     Hypertension     Hypothyroidism     Lt Acute pyelonephritis 09/11/2018   MI, old 2000   Peripheral arterial disease (HCC)      hhigh-grade ostial bilateral calcified iliac stenosis with claudication   Tobacco abuse     Vertigo      when lays on left side.               Past Surgical History:  Procedure Laterality Date   BREAST BIOPSY Right 2021    Invasive lobular carcinoma/ Metastatic carcinoma   BREAST BIOPSY Left 2021    Isolated microscopic foci of atypical ductal hyperplasia   BREAST BIOPSY Right 10/07/2023    MM RT BREAST BX W LOC DEV 1ST LESION IMAGE BX SPEC STEREO GUIDE 10/07/2023 [facility]   BREAST BIOPSY Right 10/07/2023    MM RT BREAST BX W LOC DEV EA AD LESION IMG BX SPEC STEREO GUIDE 10/07/2023 [facility]   BREAST LUMPECTOMY WITH RADIOACTIVE SEED AND SENTINEL LYMPH NODE BIOPSY Bilateral 04/02/2020    Procedure: BILATERAL BREAST LUMPECTOMY WITH RADIOACTIVE SEED AND RIGHT SENTINEL LYMPH NODE BIOPSY  AND RIGHT TARGETED AXILLARY LYMPH NODE BIOPSY;  Surgeon: Harriette Bouillon, MD;  Location:  SURGERY CENTER;  Service: General;  Laterality: Bilateral;   CARDIAC CATHETERIZATION   5 stents   CARDIAC CATHETERIZATION N/A 01/20/2016    Procedure: Left Heart Cath and Coronary Angiography;  Surgeon: Peter M Swaziland, MD;  Location: [facility];  Service: Cardiovascular;  Laterality: N/A;   CATARACT EXTRACTION W/PHACO Left 05/24/2014    Procedure: CATARACT EXTRACTION PHACO AND INTRAOCULAR LENS PLACEMENT (IOC);  Surgeon: Gemma Payor, MD;  Location: [facility];  Service: Ophthalmology;  Laterality: Left;  CDE:  9.30   CATARACT EXTRACTION W/PHACO Right 06/18/2014    Procedure: CATARACT EXTRACTION PHACO AND INTRAOCULAR LENS PLACEMENT RIGHT EYE CDE=10.84;  Surgeon: Gemma Payor, MD;  Location: [facility];  Service: Ophthalmology;  Laterality: Right;   CORONARY ARTERY BYPASS GRAFT N/A 01/24/2016    Procedure: CORONARY ARTERY BYPASS GRAFTING (CABG);  Surgeon: Alleen Borne, MD;  Location: [facility] OR;  Service: Open Heart Surgery;  Laterality: N/A;   CORONARY STENT PLACEMENT  CORONARY STENT PLACEMENT   03/06/2015    CFX DES   CYSTOSCOPY/URETEROSCOPY/HOLMIUM LASER/STENT PLACEMENT Left 09/12/2018    Procedure: CYSTOSCOPY/URETEROSCOPY/STENT PLACEMENT;  Surgeon: Rene Paci, MD;  Location: [facility];  Service: Urology;  Laterality: Left;   EYE SURGERY       LEFT HEART CATH AND CORS/GRAFTS ANGIOGRAPHY N/A 09/12/2019    Procedure: LEFT HEART CATH AND CORS/GRAFTS ANGIOGRAPHY;  Surgeon: Lennette Bihari, MD;  [facility]: [facility];  Service: Cardiovascular;  Laterality: N/A;   LEFT HEART CATHETERIZATION WITH CORONARY ANGIOGRAM N/A 03/06/2015    Procedure: LEFT HEART CATHETERIZATION WITH CORONARY ANGIOGRAM;  Surgeon: Runell Gess, MD;  Location: [facility] CATH LAB;  Service: Cardiovascular;  Laterality: N/A;   PARTIAL KNEE ARTHROPLASTY Right 02/04/2015    Procedure: UNICOMPARTMENTAL KNEE;  Surgeon:  Jodi Geralds, MD;  Location: [facility];  Service: Orthopedics;  Laterality: Right;   PERIPHERAL VASCULAR CATHETERIZATION Bilateral 05/13/2015    Procedure: Lower Extremity Angiography;  Surgeon: Runell Gess, MD;  Location: [facility] INVASIVE CV LAB;  Service: Cardiovascular;  Laterality: Bilateral;   PERIPHERAL VASCULAR CATHETERIZATION N/A 05/13/2015    Procedure: Abdominal Aortogram;  Surgeon: Runell Gess, MD;  [facility]: [facility];  Service: Cardiovascular;  Laterality: N/A;   PERIPHERAL VASCULAR CATHETERIZATION Bilateral 06/27/2015    Procedure: Peripheral Vascular Intervention;  Surgeon: Runell Gess, MD;  Location: [facility] INVASIVE CV LAB;  Service: Cardiovascular;  Laterality: Bilateral;  ILIACS   PERIPHERAL VASCULAR CATHETERIZATION Bilateral 06/27/2015    Procedure: Peripheral Vascular Atherectomy;  Surgeon: Runell Gess, MD;  Location: [facility];  Service: Cardiovascular;  Laterality: Bilateral;   PORTACATH PLACEMENT Left 05/24/2020    Procedure: INSERTION PORT-A-CATH;  Surgeon: Franky Macho, MD;  Location: [facility];  Service: General;  Laterality: Left;   TEE WITHOUT CARDIOVERSION N/A 01/24/2016    Procedure: TRANSESOPHAGEAL ECHOCARDIOGRAM (TEE);  Surgeon: Alleen Borne, MD;  Location: [facility] OR;  Service: Open Heart Surgery;  Laterality: N/A;   TONSILLECTOMY        age 70   TUBAL LIGATION                   Family History  Problem Relation Age of Onset   Ovarian cancer Mother 2   Cancer Father 53        unsure of which kind, "it was in his glands"   Hypertension Maternal Grandmother     Stroke Maternal Grandfather     Hypertension Son     Brain cancer Sister     Hypertension Daughter     Heart attack Neg Hx     Colon cancer Neg Hx     Esophageal cancer Neg Hx     Inflammatory bowel disease Neg Hx     Liver disease Neg Hx     Pancreatic cancer Neg Hx     Rectal cancer Neg Hx     Stomach cancer Neg Hx            SOCIAL HISTORY: Social History          Socioeconomic History   Marital status: Married      Spouse name: Tommy   Number of children: 4   Years of education: Not on file   Highest education level: Not on file  Occupational History   Occupation: retired      Comment: worked as Tree surgeon for [employer]  Tobacco Use   Smoking status: Former      Current  packs/day: 0.00      Average packs/day: 1.5 packs/day for 42.0 years (63.0 ttl pk-yrs)      Types: Cigarettes      Start date: 05/18/1957      Quit date: 05/19/1999      Years since quitting: 24.5   Smokeless tobacco: Never  Vaping Use   Vaping status: Never Used  Substance and Sexual Activity   Alcohol use: No   Drug use: No   Sexual activity: Yes      Birth control/protection: Surgical  Other Topics Concern   Not on file  Social History Narrative   Not on file    Social Drivers of Health        Financial Resource Strain: Medium Risk (02/18/2021)    Overall Financial Resource Strain (CARDIA)     Difficulty of Paying Living Expenses: Somewhat hard  Food Insecurity: No Food Insecurity (02/18/2021)    Hunger Vital Sign     Worried About Running Out of Food in the Last Year: Never true     Ran Out of Food in the Last Year: Never true  Transportation Needs: No Transportation Needs (02/18/2021)    PRAPARE - Therapist, art (Medical): No     Lack of Transportation (Non-Medical): No  Physical Activity: Inactive (02/18/2021)    Exercise Vital Sign     Days of Exercise per Week: 0 days     Minutes of Exercise per Session: 0 min  Stress: No Stress Concern Present (02/18/2021)    [employer] of Occupational Health - Occupational Stress Questionnaire     Feeling of Stress : Not at all  Social Connections: Moderately Isolated (02/18/2021)    Social Connection and Isolation Panel [NHANES]     Frequency of Communication with Friends and Family: More than three times a week     Frequency of Social Gatherings with Friends and  Family: Three times a week     Attends Religious Services: Never     Active Member of Clubs or Organizations: No     Attends Banker Meetings: Never     Marital Status: Married  Catering manager Violence: Not At Risk (02/18/2021)    Humiliation, Afraid, Rape, and Kick questionnaire     Fear of Current or Ex-Partner: No     Emotionally Abused: No     Physically Abused: No     Sexually Abused: No      Allergies       Allergies  Allergen Reactions   Crab [Shellfish Allergy] Nausea And Vomiting      Throws up violently   Ezetimibe Diarrhea   Keflex [Cephalexin]        Caused sores   Other Nausea And Vomiting      Shrimp   Penicillins Diarrhea   Shrimp Extract        Other Reaction(s): GI Intolerance              Current Outpatient Medications  Medication Sig Dispense Refill   amLODipine (NORVASC) 5 MG tablet Take 1 tablet (5 mg total) by mouth daily. 30 tablet 2   anastrozole (ARIMIDEX) 1 MG tablet Take 1 tablet (1 mg total) by mouth daily. 90 tablet 3   atorvastatin (LIPITOR) 40 MG tablet TAKE (1) TABLET BY MOUTH ONCE DAILY. 90 tablet 2   betamethasone dipropionate 0.05 % cream Apply topically 2 (two) times daily. 30 g 0   clopidogrel (PLAVIX) 75 MG tablet Take 1 tablet (  75 mg total) by mouth daily. 90 tablet 3   diclofenac (VOLTAREN) 50 MG EC tablet Take 50 mg by mouth as needed for moderate pain.       esomeprazole (NEXIUM) 20 MG capsule Take 20 mg by mouth daily.        HYDROcodone-acetaminophen (NORCO/VICODIN) 5-325 MG tablet Take 1 tablet by mouth daily. 15 tablet 0   hydrOXYzine (ATARAX) 25 MG tablet TAKE (1) TABLET BY MOUTH AT BEDTIME AS NEEDED 30 tablet 0   levothyroxine (SYNTHROID) 50 MCG tablet TAKE 1/2 TABLET BY MOUTH DAILY. 30 tablet 0   metoprolol tartrate (LOPRESSOR) 25 MG tablet TAKE (1) TABLET BY MOUTH TWICE DAILY. 120 tablet 3   nitrofurantoin, macrocrystal-monohydrate, (MACROBID) 100 MG capsule Take 100 mg by mouth 2 (two) times daily.        nitroGLYCERIN (NITROSTAT) 0.4 MG SL tablet DISSOLVE 1 TABLET SUBLINGUALLY AS NEEDED FOR CHEST PAIN, MAY REPEAT EVERY 5 MINUTES. AFTER 3 CALL 911. 25 tablet 4   nivolumab (OPDIVO) 100 MG/10ML SOLN chemo injection as directed Intravenous once a month       Vitamin D, Ergocalciferol, (DRISDOL) 1.25 MG (50000 UNIT) CAPS capsule Take 1 capsule (50,000 Units total) by mouth once a week. 4 capsule 0      No current facility-administered medications for this visit.             Facility-Administered Medications Ordered in Other Visits  Medication Dose Route Frequency Provider Last Rate Last Admin   sodium chloride flush (NS) 0.9 % injection 10 mL  10 mL Intracatheter PRN Doreatha Massed, MD   10 mL at 11/26/22 1631   sodium chloride flush (NS) 0.9 % injection 10 mL  10 mL Intracatheter PRN Doreatha Massed, MD   10 mL at 03/02/23 1120        REVIEW OF SYSTEMS:  [X]  denotes positive finding, [ ]  denotes negative finding Cardiac   Comments:  Chest pain or chest pressure:      Shortness of breath upon exertion:      Short of breath when lying flat:      Irregular heart rhythm:             Vascular      Pain in calf, thigh, or hip brought on by ambulation:      Pain in feet at night that wakes you up from your sleep:       Blood clot in your veins:      Leg swelling:              Pulmonary      Oxygen at home:      Productive cough:       Wheezing:              Neurologic      Sudden weakness in arms or legs:       Sudden numbness in arms or legs:       Sudden onset of difficulty speaking or slurred speech:      Temporary loss of vision in one eye:       Problems with dizziness:              Gastrointestinal      Blood in stool:       Vomited blood:              Genitourinary      Burning when urinating:       Blood in urine:  Psychiatric      Major depression:              Hematologic      Bleeding problems:      Problems with blood clotting too easily:              Skin      Rashes or ulcers:             Constitutional      Fever or chills:          PHYSICAL EXAM: There were no vitals filed for this visit.   GENERAL: The patient is a well-nourished female, in no acute distress. The vital signs are documented above. CARDIAC: There is a regular rate and rhythm.  VASCULAR:  Right radial pulse difficult to appreciate Left radial pulse palpable Left chest wall Port-A-Cath PULMONARY: No respiratory distress. ABDOMEN: Soft and non-tender. MUSCULOSKELETAL: There are no major deformities or cyanosis. NEUROLOGIC: No focal weakness or paresthesias are detected. SKIN: There are no ulcers or rashes noted. PSYCHIATRIC: The patient has a normal affect.   DATA:    UPPER EXTREMITY VEIN MAPPING  Patient Name:  Jessica Taylor  Date of Exam:   11/30/2023 Medical Rec #: [id number]       Accession #:    [id number] Date of Birth: 04-11-1941       Patient Gender: F Patient Age:   81 years Exam Location:  [facility] Procedure:      VAS [place] UPPER EXT VEIN MAPPING (PRE-OP AVF) Referring Phys: Sherald Hess   --------------------------------------------------------------------------- -----   Indications: Pre-access.  Performing Technologist: Thereasa Parkin RVT    Examination Guidelines: A complete evaluation includes B-mode imaging, spectral Doppler, color Doppler, and power Doppler as needed of all accessible portions of each vessel. Bilateral testing is considered an integral part of a complete examination. Limited examinations for reoccurring indications may be performed as noted.  +-----------------+-------------+----------+--------+ Right Cephalic   Diameter (cm)Depth (cm)Findings +-----------------+-------------+----------+--------+ Shoulder             0.28                        +-----------------+-------------+----------+--------+ Prox upper arm       0.36                         +-----------------+-------------+----------+--------+ Mid upper arm        0.32                        +-----------------+-------------+----------+--------+ Dist upper arm       0.24                        +-----------------+-------------+----------+--------+ Antecubital fossa    0.26                        +-----------------+-------------+----------+--------+ Prox forearm         0.24                        +-----------------+-------------+----------+--------+ Mid forearm          0.27                        +-----------------+-------------+----------+--------+ Dist forearm         0.16                        +-----------------+-------------+----------+--------+  +-----------------+-------------+----------+--------+  Right Basilic    Diameter (cm)Depth (cm)Findings +-----------------+-------------+----------+--------+ Mid upper arm        0.46                        +-----------------+-------------+----------+--------+ Dist upper arm       0.42                        +-----------------+-------------+----------+--------+ Antecubital fossa    0.34                        +-----------------+-------------+----------+--------+  +-----------------+-------------+----------+--------+ Left Cephalic    Diameter (cm)Depth (cm)Findings +-----------------+-------------+----------+--------+ Shoulder             0.41                        +-----------------+-------------+----------+--------+ Prox upper arm       0.47                        +-----------------+-------------+----------+--------+ Mid upper arm        0.47                        +-----------------+-------------+----------+--------+ Dist upper arm       0.48                        +-----------------+-------------+----------+--------+ Antecubital fossa    0.42                        +-----------------+-------------+----------+--------+ Prox forearm          0.34                        +-----------------+-------------+----------+--------+ Mid forearm          0.33                        +-----------------+-------------+----------+--------+ Dist forearm         0.32                        +-----------------+-------------+----------+--------+  +-----------------+-------------+----------+--------+ Left Basilic     Diameter (cm)Depth (cm)Findings +-----------------+-------------+----------+--------+ Mid upper arm        0.36                        +-----------------+-------------+----------+--------+ Dist upper arm       0.32                        +-----------------+-------------+----------+--------+ Antecubital fossa    0.34                        +-----------------+-------------+----------+--------+  Summary: Right: Patent cepahlic and basilic veins. Left: Patent cepahlic and basilic veins.  *See table(s) above for measurements and observations.     Diagnosing physician: Sherald Hess MD Electronically signed by Sherald Hess MD on 11/30/2023 at 12:58:02 PM.        Assessment/Plan:    83 y.o. female, with history 3.2 cm AAA, CAD, hypertension, PAD, tobacco abuse, breast cancer, colon cancer, urothelial carcinoma left kidney, stage IV CKD that presents for evaluation of new permanent hemodialysis access.  I discussed typically recommending access  in the nondominant arm which would be her left arm.  She does have a Port-A-Cath on the left for prior chemotherapy needs I discussed this could certainly lead to central venous stenosis or occlusion that could limit functioning of her left arm fistula.  We discussed the alternative of going to the right arm but she would prefer to use her nondominant arm that being the left arm.  She does have a good cephalic vein in the left arm.  I discussed this takes 3 months to mature after placement.  Risk benefits discussed including failure to mature, infection,  steal etc.       Cephus Shelling, MD [facility]: [phone]

## 2024-03-01 NOTE — Transfer of Care (Signed)
 Immediate Anesthesia Transfer of Care Note  Patient: Jessica Taylor  Procedure(s) Performed: BRACHIOCEPHALIC ARTERIOVENOUS (AV) FISTULA CREATION (Left: Arm Upper)  Patient Location: PACU  Anesthesia Type:MAC and Regional  Level of Consciousness: awake and alert   Airway & Oxygen Therapy: Patient Spontanous Breathing and Patient connected to face mask oxygen  Post-op Assessment: Report given to RN and Post -op Vital signs reviewed and stable  Post vital signs: Reviewed and stable  Last Vitals:  Vitals Value Taken Time  BP 118/66 03/01/24 0950  Temp    Pulse 55 03/01/24 0952  Resp 16 03/01/24 0952  SpO2 99 % 03/01/24 0952  Vitals shown include unfiled device data.  Last Pain:  Vitals:   03/01/24 0709  PainSc: 0-No pain         Complications: No notable events documented.

## 2024-03-02 ENCOUNTER — Other Ambulatory Visit: Payer: Self-pay

## 2024-03-02 ENCOUNTER — Encounter (HOSPITAL_COMMUNITY): Payer: Self-pay | Admitting: Vascular Surgery

## 2024-03-06 ENCOUNTER — Inpatient Hospital Stay: Attending: Hematology

## 2024-03-06 ENCOUNTER — Inpatient Hospital Stay

## 2024-03-06 VITALS — BP 135/57 | HR 73 | Resp 18 | Wt 161.3 lb

## 2024-03-06 VITALS — BP 142/74 | HR 85 | Temp 97.3°F | Resp 18

## 2024-03-06 DIAGNOSIS — C182 Malignant neoplasm of ascending colon: Secondary | ICD-10-CM

## 2024-03-06 DIAGNOSIS — Z5112 Encounter for antineoplastic immunotherapy: Secondary | ICD-10-CM | POA: Insufficient documentation

## 2024-03-06 DIAGNOSIS — Z79620 Long term (current) use of immunosuppressive biologic: Secondary | ICD-10-CM | POA: Diagnosis not present

## 2024-03-06 DIAGNOSIS — C642 Malignant neoplasm of left kidney, except renal pelvis: Secondary | ICD-10-CM

## 2024-03-06 DIAGNOSIS — C773 Secondary and unspecified malignant neoplasm of axilla and upper limb lymph nodes: Secondary | ICD-10-CM | POA: Insufficient documentation

## 2024-03-06 DIAGNOSIS — Z95828 Presence of other vascular implants and grafts: Secondary | ICD-10-CM

## 2024-03-06 LAB — CBC WITH DIFFERENTIAL/PLATELET
Abs Immature Granulocytes: 0.03 10*3/uL (ref 0.00–0.07)
Basophils Absolute: 0 10*3/uL (ref 0.0–0.1)
Basophils Relative: 1 %
Eosinophils Absolute: 0 10*3/uL (ref 0.0–0.5)
Eosinophils Relative: 0 %
HCT: 38.3 % (ref 36.0–46.0)
Hemoglobin: 12.1 g/dL (ref 12.0–15.0)
Immature Granulocytes: 1 %
Lymphocytes Relative: 13 %
Lymphs Abs: 0.8 10*3/uL (ref 0.7–4.0)
MCH: 28.9 pg (ref 26.0–34.0)
MCHC: 31.6 g/dL (ref 30.0–36.0)
MCV: 91.6 fL (ref 80.0–100.0)
Monocytes Absolute: 0.3 10*3/uL (ref 0.1–1.0)
Monocytes Relative: 5 %
Neutro Abs: 5.2 10*3/uL (ref 1.7–7.7)
Neutrophils Relative %: 80 %
Platelets: 157 10*3/uL (ref 150–400)
RBC: 4.18 MIL/uL (ref 3.87–5.11)
RDW: 14.7 % (ref 11.5–15.5)
WBC: 6.5 10*3/uL (ref 4.0–10.5)
nRBC: 0 % (ref 0.0–0.2)

## 2024-03-06 LAB — TSH: TSH: 6.762 u[IU]/mL — ABNORMAL HIGH (ref 0.350–4.500)

## 2024-03-06 LAB — COMPREHENSIVE METABOLIC PANEL WITH GFR
ALT: 10 U/L (ref 0–44)
AST: 19 U/L (ref 15–41)
Albumin: 3.8 g/dL (ref 3.5–5.0)
Alkaline Phosphatase: 78 U/L (ref 38–126)
Anion gap: 12 (ref 5–15)
BUN: 24 mg/dL — ABNORMAL HIGH (ref 8–23)
CO2: 22 mmol/L (ref 22–32)
Calcium: 9.7 mg/dL (ref 8.9–10.3)
Chloride: 104 mmol/L (ref 98–111)
Creatinine, Ser: 2.62 mg/dL — ABNORMAL HIGH (ref 0.44–1.00)
GFR, Estimated: 17 mL/min — ABNORMAL LOW (ref >60)
Glucose, Bld: 133 mg/dL — ABNORMAL HIGH (ref 70–99)
Potassium: 3.5 mmol/L (ref 3.5–5.1)
Sodium: 138 mmol/L (ref 135–145)
Total Bilirubin: 0.7 mg/dL (ref 0.0–1.2)
Total Protein: 6.7 g/dL (ref 6.5–8.1)

## 2024-03-06 LAB — MAGNESIUM: Magnesium: 1.6 mg/dL — ABNORMAL LOW (ref 1.7–2.4)

## 2024-03-06 MED ORDER — SODIUM CHLORIDE 0.9 % IV SOLN
480.0000 mg | Freq: Once | INTRAVENOUS | Status: AC
  Administered 2024-03-06: 480 mg via INTRAVENOUS
  Filled 2024-03-06: qty 48

## 2024-03-06 MED ORDER — SODIUM CHLORIDE 0.9% FLUSH
10.0000 mL | INTRAVENOUS | Status: DC | PRN
Start: 2024-03-06 — End: 2024-03-06
  Administered 2024-03-06: 10 mL via INTRAVENOUS

## 2024-03-06 MED ORDER — HEPARIN SOD (PORK) LOCK FLUSH 100 UNIT/ML IV SOLN
500.0000 [IU] | Freq: Once | INTRAVENOUS | Status: AC | PRN
  Administered 2024-03-06: 500 [IU]

## 2024-03-06 MED ORDER — MAGNESIUM SULFATE 2 GM/50ML IV SOLN
2.0000 g | Freq: Once | INTRAVENOUS | Status: AC
  Administered 2024-03-06: 2 g via INTRAVENOUS
  Filled 2024-03-06: qty 50

## 2024-03-06 MED ORDER — SODIUM CHLORIDE 0.9 % IV SOLN: Freq: Once | INTRAVENOUS | Status: AC

## 2024-03-06 NOTE — Patient Instructions (Signed)
 [facility] [place] - A DEPT OF [facility]  Discharge Instructions: Thank you for choosing [facility] to provide your oncology and hematology care.  If you have a lab appointment with the Cancer Center - please note that after April 8th, 2024, all labs will be drawn in the cancer center.  You do not have to check in or register with the main entrance as you have in the past but will complete your check-in in the cancer center.  Wear comfortable clothing and clothing appropriate for easy access to any Portacath or PICC line.   We strive to give you quality time with your provider. You may need to reschedule your appointment if you arrive late (15 or more minutes).  Arriving late affects you and other patients whose appointments are after yours.  Also, if you miss three or more appointments without notifying the office, you may be dismissed from the clinic at the provider's discretion.      For prescription refill requests, have your pharmacy contact our office and allow 72 hours for refills to be completed.    Today you received the following chemotherapy and/or immunotherapy agents opidvo    To help prevent nausea and vomiting after your treatment, we encourage you to take your nausea medication as directed.  BELOW ARE SYMPTOMS THAT SHOULD BE REPORTED IMMEDIATELY: *FEVER GREATER THAN 100.4 F (38 C) OR HIGHER *CHILLS OR SWEATING *NAUSEA AND VOMITING THAT IS NOT CONTROLLED WITH YOUR NAUSEA MEDICATION *UNUSUAL SHORTNESS OF BREATH *UNUSUAL BRUISING OR BLEEDING *URINARY PROBLEMS (pain or burning when urinating, or frequent urination) *BOWEL PROBLEMS (unusual diarrhea, constipation, pain near the anus) TENDERNESS IN MOUTH AND THROAT WITH OR WITHOUT PRESENCE OF ULCERS (sore throat, sores in mouth, or a toothache) UNUSUAL RASH, SWELLING OR PAIN  UNUSUAL VAGINAL DISCHARGE OR ITCHING   Items with * indicate a potential emergency and should be followed up as  soon as possible or go to the Emergency Department if any problems should occur.  Please show the CHEMOTHERAPY ALERT CARD or IMMUNOTHERAPY ALERT CARD at check-in to the Emergency Department and triage nurse.  Should you have questions after your visit or need to cancel or reschedule your appointment, please contact [facility] [place] - A DEPT OF [facility] [phone]  and follow the prompts.  Office hours are 8:00 a.m. to 4:30 p.m. Monday - Friday. Please note that voicemails left after 4:00 p.m. may not be returned until the following business day.  We are closed weekends and major holidays. You have access to a nurse at all times for urgent questions. Please call the main number to the clinic [phone] and follow the prompts.  For any non-urgent questions, you may also contact your provider using MyChart. We now offer e-Visits for anyone 97 and older to request care online for non-urgent symptoms. For details visit [email or web address].   Also download the MyChart app! Go to the app store, search "MyChart", open the app, select [place], and log in with your MyChart username and password.

## 2024-03-06 NOTE — Progress Notes (Signed)
 Labs reviewed with MD. Per MD to give patient 500mL NS over 30 minutes, Magnesium 2g IV over 1 hour, and to continue with treatment today. Pt and treatment team updated and all questions answered at this time.   Patient tolerated chemotherapy with no complaints voiced.  Side effects with management reviewed with understanding verbalized.  Port site clean and dry with no bruising or swelling noted at site.  Good blood return noted before and after administration of chemotherapy.  Band aid applied.  Patient left in satisfactory condition with VSS and no s/s of distress noted. All follow ups as scheduled.   Shavar Gorka [employer]

## 2024-03-06 NOTE — Progress Notes (Signed)
 Patients port flushed without difficulty.  Good blood return noted with no bruising or swelling noted at site.  Patient remains accessed for treatment.

## 2024-03-07 LAB — CEA: CEA: 9.5 ng/mL — ABNORMAL HIGH (ref 0.0–4.7)

## 2024-03-07 LAB — T4: T4, Total: 6.4 ug/dL (ref 4.5–12.0)

## 2024-03-17 ENCOUNTER — Other Ambulatory Visit: Payer: Self-pay

## 2024-03-17 DIAGNOSIS — I252 Old myocardial infarction: Secondary | ICD-10-CM | POA: Diagnosis not present

## 2024-03-17 DIAGNOSIS — C182 Malignant neoplasm of ascending colon: Secondary | ICD-10-CM | POA: Diagnosis not present

## 2024-03-17 DIAGNOSIS — I2510 Atherosclerotic heart disease of native coronary artery without angina pectoris: Secondary | ICD-10-CM | POA: Diagnosis not present

## 2024-03-17 DIAGNOSIS — Z0000 Encounter for general adult medical examination without abnormal findings: Secondary | ICD-10-CM | POA: Diagnosis not present

## 2024-03-17 DIAGNOSIS — C642 Malignant neoplasm of left kidney, except renal pelvis: Secondary | ICD-10-CM | POA: Diagnosis not present

## 2024-03-17 DIAGNOSIS — C50911 Malignant neoplasm of unspecified site of right female breast: Secondary | ICD-10-CM | POA: Diagnosis not present

## 2024-03-17 DIAGNOSIS — E559 Vitamin D deficiency, unspecified: Secondary | ICD-10-CM | POA: Diagnosis not present

## 2024-03-17 DIAGNOSIS — I10 Essential (primary) hypertension: Secondary | ICD-10-CM | POA: Diagnosis not present

## 2024-03-17 DIAGNOSIS — E039 Hypothyroidism, unspecified: Secondary | ICD-10-CM | POA: Diagnosis not present

## 2024-03-17 DIAGNOSIS — E782 Mixed hyperlipidemia: Secondary | ICD-10-CM | POA: Diagnosis not present

## 2024-03-17 DIAGNOSIS — N1832 Chronic kidney disease, stage 3b: Secondary | ICD-10-CM | POA: Diagnosis not present

## 2024-03-17 MED ORDER — VITAMIN D (ERGOCALCIFEROL) 1.25 MG (50000 UNIT) PO CAPS: 50000.0000 [IU] | ORAL_CAPSULE | ORAL | 0 refills | Status: DC

## 2024-03-23 ENCOUNTER — Ambulatory Visit (HOSPITAL_COMMUNITY)
Admission: RE | Admit: 2024-03-23 | Discharge: 2024-03-23 | Disposition: A | Discharge status: Home / Self Care | Source: Ambulatory Visit | Attending: Hematology | Admitting: Hematology

## 2024-03-23 DIAGNOSIS — C182 Malignant neoplasm of ascending colon: Secondary | ICD-10-CM | POA: Insufficient documentation

## 2024-03-23 DIAGNOSIS — I7143 Infrarenal abdominal aortic aneurysm, without rupture: Secondary | ICD-10-CM | POA: Diagnosis not present

## 2024-03-23 DIAGNOSIS — C642 Malignant neoplasm of left kidney, except renal pelvis: Secondary | ICD-10-CM | POA: Diagnosis not present

## 2024-03-23 MED ORDER — IOHEXOL 9 MG/ML PO SOLN
500.0000 mL | ORAL | Status: AC
  Administered 2024-03-23: 500 mL via ORAL

## 2024-03-23 MED ORDER — IOHEXOL 9 MG/ML PO SOLN
ORAL | Status: AC
  Filled 2024-03-23: qty 1000

## 2024-03-27 ENCOUNTER — Other Ambulatory Visit: Payer: Self-pay | Admitting: Hematology

## 2024-03-31 ENCOUNTER — Other Ambulatory Visit: Payer: Self-pay | Admitting: *Deleted

## 2024-03-31 DIAGNOSIS — N186 End stage renal disease: Secondary | ICD-10-CM

## 2024-04-03 ENCOUNTER — Inpatient Hospital Stay: Attending: Hematology | Admitting: Hematology

## 2024-04-03 ENCOUNTER — Inpatient Hospital Stay: Attending: Hematology

## 2024-04-03 ENCOUNTER — Inpatient Hospital Stay

## 2024-04-03 VITALS — BP 140/61 | HR 53 | Temp 97.5°F | Resp 18

## 2024-04-03 VITALS — Wt 157.6 lb

## 2024-04-03 DIAGNOSIS — Z8041 Family history of malignant neoplasm of ovary: Secondary | ICD-10-CM | POA: Insufficient documentation

## 2024-04-03 DIAGNOSIS — C642 Malignant neoplasm of left kidney, except renal pelvis: Secondary | ICD-10-CM

## 2024-04-03 DIAGNOSIS — Z1732 Human epidermal growth factor receptor 2 negative status: Secondary | ICD-10-CM | POA: Diagnosis not present

## 2024-04-03 DIAGNOSIS — Z1721 Progesterone receptor positive status: Secondary | ICD-10-CM | POA: Diagnosis not present

## 2024-04-03 DIAGNOSIS — E039 Hypothyroidism, unspecified: Secondary | ICD-10-CM | POA: Insufficient documentation

## 2024-04-03 DIAGNOSIS — C182 Malignant neoplasm of ascending colon: Secondary | ICD-10-CM | POA: Diagnosis not present

## 2024-04-03 DIAGNOSIS — R911 Solitary pulmonary nodule: Secondary | ICD-10-CM | POA: Diagnosis not present

## 2024-04-03 DIAGNOSIS — C652 Malignant neoplasm of left renal pelvis: Secondary | ICD-10-CM | POA: Insufficient documentation

## 2024-04-03 DIAGNOSIS — Z79811 Long term (current) use of aromatase inhibitors: Secondary | ICD-10-CM | POA: Insufficient documentation

## 2024-04-03 DIAGNOSIS — Z170 Estrogen receptor positive status [ER+]: Secondary | ICD-10-CM | POA: Diagnosis not present

## 2024-04-03 DIAGNOSIS — C50911 Malignant neoplasm of unspecified site of right female breast: Secondary | ICD-10-CM | POA: Diagnosis not present

## 2024-04-03 DIAGNOSIS — Z87891 Personal history of nicotine dependence: Secondary | ICD-10-CM | POA: Diagnosis not present

## 2024-04-03 DIAGNOSIS — Z808 Family history of malignant neoplasm of other organs or systems: Secondary | ICD-10-CM | POA: Insufficient documentation

## 2024-04-03 DIAGNOSIS — Z5112 Encounter for antineoplastic immunotherapy: Secondary | ICD-10-CM | POA: Insufficient documentation

## 2024-04-03 DIAGNOSIS — Z79620 Long term (current) use of immunosuppressive biologic: Secondary | ICD-10-CM | POA: Diagnosis not present

## 2024-04-03 LAB — CBC WITH DIFFERENTIAL/PLATELET
Abs Immature Granulocytes: 0.01 10*3/uL (ref 0.00–0.07)
Basophils Absolute: 0.1 10*3/uL (ref 0.0–0.1)
Basophils Relative: 1 %
Eosinophils Absolute: 0.2 10*3/uL (ref 0.0–0.5)
Eosinophils Relative: 3 %
HCT: 39.2 % (ref 36.0–46.0)
Hemoglobin: 12.6 g/dL (ref 12.0–15.0)
Immature Granulocytes: 0 %
Lymphocytes Relative: 28 %
Lymphs Abs: 1.4 10*3/uL (ref 0.7–4.0)
MCH: 29.4 pg (ref 26.0–34.0)
MCHC: 32.1 g/dL (ref 30.0–36.0)
MCV: 91.4 fL (ref 80.0–100.0)
Monocytes Absolute: 0.4 10*3/uL (ref 0.1–1.0)
Monocytes Relative: 8 %
Neutro Abs: 2.8 10*3/uL (ref 1.7–7.7)
Neutrophils Relative %: 60 %
Platelets: 169 10*3/uL (ref 150–400)
RBC: 4.29 MIL/uL (ref 3.87–5.11)
RDW: 14.7 % (ref 11.5–15.5)
WBC: 4.8 10*3/uL (ref 4.0–10.5)
nRBC: 0 % (ref 0.0–0.2)

## 2024-04-03 LAB — COMPREHENSIVE METABOLIC PANEL WITH GFR
ALT: 12 U/L (ref 0–44)
AST: 18 U/L (ref 15–41)
Albumin: 3.9 g/dL (ref 3.5–5.0)
Alkaline Phosphatase: 82 U/L (ref 38–126)
Anion gap: 10 (ref 5–15)
BUN: 20 mg/dL (ref 8–23)
CO2: 21 mmol/L — ABNORMAL LOW (ref 22–32)
Calcium: 9.7 mg/dL (ref 8.9–10.3)
Chloride: 106 mmol/L (ref 98–111)
Creatinine, Ser: 2.6 mg/dL — ABNORMAL HIGH (ref 0.44–1.00)
GFR, Estimated: 18 mL/min — ABNORMAL LOW (ref >60)
Glucose, Bld: 107 mg/dL — ABNORMAL HIGH (ref 70–99)
Potassium: 3.9 mmol/L (ref 3.5–5.1)
Sodium: 137 mmol/L (ref 135–145)
Total Bilirubin: 0.5 mg/dL (ref 0.0–1.2)
Total Protein: 7 g/dL (ref 6.5–8.1)

## 2024-04-03 LAB — MAGNESIUM: Magnesium: 1.8 mg/dL (ref 1.7–2.4)

## 2024-04-03 LAB — TSH: TSH: 5.282 u[IU]/mL — ABNORMAL HIGH (ref 0.350–4.500)

## 2024-04-03 MED ORDER — SODIUM CHLORIDE 0.9% FLUSH
10.0000 mL | Freq: Once | INTRAVENOUS | Status: AC
  Administered 2024-04-03: 10 mL via INTRAVENOUS

## 2024-04-03 MED ORDER — SODIUM CHLORIDE 0.9 % IV SOLN
480.0000 mg | Freq: Once | INTRAVENOUS | Status: AC
  Administered 2024-04-03: 480 mg via INTRAVENOUS
  Filled 2024-04-03: qty 48

## 2024-04-03 MED ORDER — SODIUM CHLORIDE 0.9 % IV SOLN: Freq: Once | INTRAVENOUS | Status: AC

## 2024-04-03 MED ORDER — HEPARIN SOD (PORK) LOCK FLUSH 100 UNIT/ML IV SOLN
500.0000 [IU] | Freq: Once | INTRAVENOUS | Status: AC | PRN
  Administered 2024-04-03: 500 [IU]

## 2024-04-03 MED ORDER — SODIUM CHLORIDE 0.9% FLUSH
10.0000 mL | INTRAVENOUS | Status: DC | PRN
  Administered 2024-04-03: 10 mL

## 2024-04-03 NOTE — Progress Notes (Signed)
Patient presents today for Opdivo infusion. Patient is in satisfactory condition with no new complaints voiced.  Vital signs are stable.  Labs reviewed by Dr. Ellin Saba during the office visit and all labs are within treatment parameters.  We will proceed with treatment per MD orders.   Treatment given today per MD orders. Tolerated infusion without adverse affects. Vital signs stable. No complaints at this time. Discharged from clinic ambulatory in stable condition. Alert and oriented x 3. F/U with [facility] as scheduled.

## 2024-04-03 NOTE — Patient Instructions (Addendum)
 [facility] at [facility] Discharge Instructions   You were seen and examined today by Dr. Cheree Cords.  He reviewed the results of your lab work which are normal/stable.   He reviewed the results of your CT scan which was stable.   We will proceed with your treatment today.   Return as scheduled.    Thank you for choosing [facility] at [facility] to provide your oncology and hematology care.  To afford each patient quality time with our provider, please arrive at least 15 minutes before your scheduled appointment time.   If you have a lab appointment with the Cancer Center please come in thru the Main Entrance and check in at the main information desk.  You need to re-schedule your appointment should you arrive 10 or more minutes late.  We strive to give you quality time with our providers, and arriving late affects you and other patients whose appointments are after yours.  Also, if you no show three or more times for appointments you may be dismissed from the clinic at the providers discretion.     Again, thank you for choosing [facility].  Our hope is that these requests will decrease the amount of time that you wait before being seen by our physicians.       _____________________________________________________________  Should you have questions after your visit to [facility], please contact our office at [phone] and follow the prompts.  Our office hours are 8:00 a.m. and 4:30 p.m. Monday - Friday.  Please note that voicemails left after 4:00 p.m. may not be returned until the following business day.  We are closed weekends and major holidays.  You do have access to a nurse 24-7, just call the main number to the clinic [phone] and do not press any options, hold on the line and a nurse will answer the phone.    For prescription refill requests, have your pharmacy contact our office and allow 72 hours.     Due to Covid, you will need to wear a mask upon entering the hospital. If you do not have a mask, a mask will be given to you at the Main Entrance upon arrival. For doctor visits, patients may have 1 support person age 85 or older with them. For treatment visits, patients can not have anyone with them due to social distancing guidelines and our immunocompromised population.

## 2024-04-03 NOTE — Progress Notes (Signed)
 [facility] [address] [place] [address]    Clinic Day:  04/03/2024  Referring physician: Rae Bugler, MD  Patient Care Team: Rae Bugler, MD as PCP - General (Family Medicine) Hugh Madura, MD as PCP - Cardiology (Cardiology) Candyce Champagne, MD as Consulting Physician (General Surgery) Hugh Madura, MD as Consulting Physician (Cardiology) Mansouraty, Albino Alu., MD as Consulting Physician (Gastroenterology) Wilson, Diane G, RN as Oncology Nurse Navigator Paulett Boros, MD as Consulting Physician (Medical Oncology)   ASSESSMENT & PLAN:   Assessment: 1.  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  Right hemicolectomy on 10/11/2019, grade 3 adenocarcinoma, negative margins, positive LVSI, 2 tumor deposits, 0/15 lymph nodes positive, PT3PN1C. 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  MLH1 hyper methylation present. -PET scan in December 2020 showed lymph node in the right axillary region.  Biopsy consistent with metastatic colon cancer. -Last CEA was 10.7 on 11/22/2019. -Right axillary lymph node excision on 04/02/2020 consistent with metastatic carcinoma from colon cancer. -Pembrolizumab  started on 05/14/2020. -PET scan on 08/05/2020 shows complete resolution of lymphadenopathy.  No evidence of new areas of uptake.  Mild vague residual hypermetabolism in the right axilla without soft tissue mass. -PET scan on 12/23/2020 with no evidence of recurrence. - Keytruda  held since we started opdivo  on 08/27/2022   2.  Stage I (PT1CPN0) right Breast, Grade 2 Invasive Lobular Carcinoma: -MRI of the breast on 12/25/2019 showed 1 cm mass behind the right areola with a suspicious mass in the left breast. -Right breast lumpectomy on 04/02/2020 shows invasive lobular carcinoma, grade 2, 1.2 cm.  Resection margins are negative.  Negative LVSI.  2 sentinel lymph nodes  were negative for carcinoma.  ER/PR 100% positive, HER-2 negative, Ki-67 10%.   3.  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 - Based on her renal function, she is not a candidate for cisplatin based chemotherapy.  Nivolumab  was recommended for 1 year. - PET scan (08/06/2022): No evidence of metastatic disease. - Opdivo  for 1 year started on 08/27/2022    Plan: 1.  Stage IV colon cancer to the right axillary lymph node: - Previous CT from 11/09/2023 showed 9 mm subpleural nodule in the left lower lobe, increased from 7 mm from June. - She is tolerating nivolumab  every 4 weeks very well.  No immunotherapy or side effects. - Reviewed labs today: Normal LFTs.  Creatinine stable at 2.6.  CBC grossly normal.  CEA was 9.5, previously stable between 10-12. - CT CAP from 03/23/2024: Stable 8 mm left lower lobe lung nodule.  No evidence of metastatic disease in the abdomen or pelvis. - We discussed continuing nivolumab  every 4 weeks.  RTC 8 weeks for follow-up.  If by next scan, there is no growth or new lesions, we may consider discontinuing nivolumab  and monitor closely.   2.  Right breast invasive lobular carcinoma, grade 2: - Continue anastrozole  daily.  She is tolerating well. - Abnormal mammogram in #2024, status post biopsy which was benign breast tissue with fat necrosis.   3.  Left lung pulmonary nodule: - CT CAP on  11/09/2023 showed left lower lobe lung nodule measuring 9 mm, previously 7 mm in June 2024 and 4 mm in July 2023. - CT chest on 03/23/2024 shows nodule is stable at 8 mm.   4.  Stage III (PT3PN0)  left renal pelvis high-grade urothelial carcinoma: - She has completed 1 year of adjuvant nivolumab .  Continue surveillance scans.   5.  Hypothyroidism: - She is taking Synthroid  50 mcg daily.  Some days she does not take it on empty stomach.  Latest TSH is up at 6.7 on 03/06/2024.  Previously it was normal at 3.9.  Will continue same dose of Synthroid  and recheck TSH at next visit.  If it continues to go up, will increase dose.    Orders Placed This Encounter  Procedures   Magnesium     Standing Status:   Future    Expected Date:   07/02/2024    Expiration Date:   07/02/2025   CBC with Differential    Standing Status:   Future    Expected Date:   07/02/2024    Expiration Date:   07/03/2025   Comprehensive metabolic panel    Standing Status:   Future    Expected Date:   07/02/2024    Expiration Date:   07/03/2025   T4    Standing Status:   Future    Expected Date:   07/02/2024    Expiration Date:   07/03/2025   TSH    Standing Status:   Future    Expected Date:   07/02/2024    Expiration Date:   07/03/2025      Hurman Maiden R Teague,acting as a scribe for Paulett Boros, MD.,have documented all relevant documentation on the behalf of Paulett Boros, MD,as directed by  Paulett Boros, MD while in the presence of Paulett Boros, MD.  I, Paulett Boros MD, have reviewed the above documentation for accuracy and completeness, and I agree with the above.     Paulett Boros, MD   5/12/20251:31 PM  CHIEF COMPLAINT:   Diagnosis: stage I right breast cancer and colon cancer and left kidney urothelial carcinoma    Cancer Staging  Cancer of ascending colon s/p robotic proximal colectomy 10/11/2019 Staging form: Colon and Rectum, AJCC 8th Edition - Clinical stage from 10/23/2019: Stage IVA (cT3, cN1c, cM1a) - Signed by Paulett Boros, MD on 12/14/2019  Malignant neoplasm of right female breast [facility]) Staging form: Breast, AJCC 8th Edition - Clinical stage  from 04/23/2020: Stage IA (cT1c, cN0(sn), cM0, G2, ER+, PR+, HER2-) - Signed by Paulett Boros, MD on 04/23/2020  Urothelial carcinoma of kidney, left [facility]) Staging form: Kidney, AJCC 8th Edition - Clinical stage from 08/18/2022: Stage III (cT3, cN0, cM0) - Unsigned    Prior Therapy: 1. Robotic proximal colectomy on 10/11/2019. 2. Right breast lumpectomy and SLNB on 04/02/2020 3.  Left radical nephro ureterectomy, lymphadenectomy, bladder cuff excision on 07/13/2022  Current Therapy:  Opdivo  every 4 weeks    HISTORY OF PRESENT ILLNESS:   Oncology History  Cancer of ascending colon s/p robotic proximal colectomy 10/11/2019  10/11/2019 Initial Diagnosis   Cancer of ascending colon s/p robotic proximal colectomy 10/11/2019   10/23/2019 Cancer Staging   Staging form: Colon and Rectum, AJCC 8th Edition - Clinical stage from 10/23/2019: Stage IVA (cT3, cN1c, cM1a) - Signed by Paulett Boros, MD on 12/14/2019   05/14/2020 - 06/26/2022 Chemotherapy   Patient is on Treatment Plan : COLORECTAL Pembrolizumab  q21d     05/14/2020 - 06/26/2022 Chemotherapy  Patient is on Treatment Plan : COLORECTAL Pembrolizumab  (200) q21d     08/27/2022 -  Chemotherapy   Patient is on Treatment Plan : COLORECTAL Nivolumab  (480) q28d     Malignant neoplasm of right female breast (HCC)  04/23/2020 Initial Diagnosis   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 [facility])   04/23/2020 Cancer Staging   Staging form: Breast, AJCC 8th Edition - Clinical stage from 04/23/2020: Stage IA (cT1c, cN0(sn), cM0, G2, ER+, PR+, HER2-) - Signed by Paulett Boros, MD on 04/23/2020   Urothelial carcinoma of kidney, left (HCC)  08/18/2022 Initial Diagnosis   Urothelial carcinoma of kidney, left (HCC)   08/27/2022 -  Chemotherapy   Patient is on Treatment Plan : COLORECTAL Nivolumab  (480) q28d        INTERVAL HISTORY:   Rilea is a 83 y.o. female presenting to clinic today for follow up of stage I right breast  cancer and colon cancer and left kidney urothelial carcinoma. She was last seen by me on 02/07/24.  Since her last visit, she had CT CAP on 03/23/24 that found: Stable 8 mm left lower lobe pulmonary nodule. No convincing evidence for malignancy within the abdomen or pelvis. 3.2 cm infrarenal abdominal aortic aneurysm  [place] underwent brachiocephalic arteriovenous fistula creation on 03/01/24 with Dr. Fulton Job.   Today, she states that she is doing well overall. Her appetite level is at 100%. Her energy level is at 75%.  PAST MEDICAL HISTORY:   Past Medical History: Past Medical History:  Diagnosis Date   Abdominal aortic aneurysm (HCC)    Acid reflux    Anemia    Arthritis    knees , R shoulder - tx /w injection - 11/2014   Basal cell carcinoma (BCC) of dorsum of nose 2010   Resolved   Cancer (HCC)    basal cell on nose   Colon cancer (HCC)    Coronary artery disease    Hypercholesteremia    Hypertension    Hypothyroidism    Lt Acute pyelonephritis 09/11/2018   MI, old 2000   Peripheral arterial disease (HCC)    hhigh-grade ostial bilateral calcified iliac stenosis with claudication   Tobacco abuse    Vertigo    when lays on left side.    Surgical History: Past Surgical History:  Procedure Laterality Date   AV FISTULA PLACEMENT Left 03/01/2024   Procedure: BRACHIOCEPHALIC ARTERIOVENOUS (AV) FISTULA CREATION;  Surgeon: Young Hensen, MD;  Location: [facility] OR;  Service: Vascular;  Laterality: Left;   BREAST BIOPSY Right 2021   Invasive lobular carcinoma/ Metastatic carcinoma   BREAST BIOPSY Left 2021   Isolated microscopic foci of atypical ductal hyperplasia   BREAST BIOPSY Right 10/07/2023   MM RT BREAST BX W LOC DEV 1ST LESION IMAGE BX SPEC STEREO GUIDE 10/07/2023 [facility]   BREAST BIOPSY Right 10/07/2023   MM RT BREAST BX W LOC DEV EA AD LESION IMG BX SPEC STEREO GUIDE 10/07/2023 [facility]   BREAST LUMPECTOMY WITH RADIOACTIVE SEED AND SENTINEL LYMPH NODE  BIOPSY Bilateral 04/02/2020   Procedure: BILATERAL BREAST LUMPECTOMY WITH RADIOACTIVE SEED AND RIGHT SENTINEL LYMPH NODE BIOPSY AND RIGHT TARGETED AXILLARY LYMPH NODE BIOPSY;  Surgeon: Sim Dryer, MD;  Location: [facility];  Service: General;  Laterality: Bilateral;   CARDIAC CATHETERIZATION  5 stents   CARDIAC CATHETERIZATION N/A 01/20/2016   Procedure: Left Heart Cath and Coronary Angiography;  Surgeon: Peter M Swaziland, MD;  Location: [facility] INVASIVE CV LAB;  Service:  Cardiovascular;  Laterality: N/A;   CATARACT EXTRACTION W/PHACO Left 05/24/2014   Procedure: CATARACT EXTRACTION PHACO AND INTRAOCULAR LENS PLACEMENT (IOC);  Surgeon: Anner Kill, MD;  Location: [facility] ORS;  Service: Ophthalmology;  Laterality: Left;  CDE:  9.30   CATARACT EXTRACTION W/PHACO Right 06/18/2014   Procedure: CATARACT EXTRACTION PHACO AND INTRAOCULAR LENS PLACEMENT RIGHT EYE CDE=10.84;  Surgeon: Anner Kill, MD;  Location: [facility];  Service: Ophthalmology;  Laterality: Right;   CORONARY ARTERY BYPASS GRAFT N/A 01/24/2016   Procedure: CORONARY ARTERY BYPASS GRAFTING (CABG);  Surgeon: Bartley Lightning, MD;  Location: [facility] OR;  Service: Open Heart Surgery;  Laterality: N/A;   CORONARY STENT PLACEMENT     CORONARY STENT PLACEMENT  03/06/2015   CFX DES   CYSTOSCOPY/URETEROSCOPY/HOLMIUM LASER/STENT PLACEMENT Left 09/12/2018   Procedure: CYSTOSCOPY/URETEROSCOPY/STENT PLACEMENT;  Surgeon: Adelbert Homans, MD;  Location: [facility] ORS;  Service: Urology;  Laterality: Left;   EYE SURGERY     LEFT HEART CATH AND CORS/GRAFTS ANGIOGRAPHY N/A 09/12/2019   Procedure: LEFT HEART CATH AND CORS/GRAFTS ANGIOGRAPHY;  Surgeon: Millicent Ally, MD;  Location: [facility] INVASIVE CV LAB;  Service: Cardiovascular;  Laterality: N/A;   LEFT HEART CATHETERIZATION WITH CORONARY ANGIOGRAM N/A 03/06/2015   Procedure: LEFT HEART CATHETERIZATION WITH CORONARY ANGIOGRAM;  Surgeon: Avanell Leigh, MD;  Location: [facility] CATH LAB;  Service: Cardiovascular;   Laterality: N/A;   PARTIAL KNEE ARTHROPLASTY Right 02/04/2015   Procedure: UNICOMPARTMENTAL KNEE;  Surgeon: Neil Balls, MD;  Location: [facility] OR;  Service: Orthopedics;  Laterality: Right;   PERIPHERAL VASCULAR CATHETERIZATION Bilateral 05/13/2015   Procedure: Lower Extremity Angiography;  Surgeon: Avanell Leigh, MD;  Location: [facility] INVASIVE CV LAB;  Service: Cardiovascular;  Laterality: Bilateral;   PERIPHERAL VASCULAR CATHETERIZATION N/A 05/13/2015   Procedure: Abdominal Aortogram;  Surgeon: Avanell Leigh, MD;  Location: [facility] INVASIVE CV LAB;  Service: Cardiovascular;  Laterality: N/A;   PERIPHERAL VASCULAR CATHETERIZATION Bilateral 06/27/2015   Procedure: Peripheral Vascular Intervention;  Surgeon: Avanell Leigh, MD;  Location: [facility] INVASIVE CV LAB;  Service: Cardiovascular;  Laterality: Bilateral;  ILIACS   PERIPHERAL VASCULAR CATHETERIZATION Bilateral 06/27/2015   Procedure: Peripheral Vascular Atherectomy;  Surgeon: Avanell Leigh, MD;  Location: [facility] INVASIVE CV LAB;  Service: Cardiovascular;  Laterality: Bilateral;   PORTACATH PLACEMENT Left 05/24/2020   Procedure: INSERTION PORT-A-CATH;  Surgeon: Alanda Allegra, MD;  Location: [facility];  Service: General;  Laterality: Left;   TEE WITHOUT CARDIOVERSION N/A 01/24/2016   Procedure: TRANSESOPHAGEAL ECHOCARDIOGRAM (TEE);  Surgeon: Bartley Lightning, MD;  Location: [facility] OR;  Service: Open Heart Surgery;  Laterality: N/A;   TONSILLECTOMY     age 50   TUBAL LIGATION      Social History: Social History   Socioeconomic History   Marital status: Married    Spouse name: Tommy   Number of children: 4   Years of education: Not on file   Highest education level: Not on file  Occupational History   Occupation: retired    Comment: worked as Tree surgeon for [employer]  Tobacco Use   Smoking status: Former    Current packs/day: 0.00    Average packs/day: 1.5 packs/day for 42.0 years (63.0 ttl pk-yrs)    Types: Cigarettes    Start date:  05/18/1957    Quit date: 05/19/1999    Years since quitting: 24.8   Smokeless tobacco: Never  Vaping Use   Vaping status: Never Used  Substance and Sexual Activity   Alcohol use: No  Drug use: No   Sexual activity: Yes    Birth control/protection: Surgical  Other Topics Concern   Not on file  Social History Narrative   Not on file   Social Drivers of Health   Financial Resource Strain: Medium Risk (02/18/2021)   Overall Financial Resource Strain (CARDIA)    Difficulty of Paying Living Expenses: Somewhat hard  Food Insecurity: No Food Insecurity (02/18/2021)   Hunger Vital Sign    Worried About Running Out of Food in the Last Year: Never true    Ran Out of Food in the Last Year: Never true  Transportation Needs: No Transportation Needs (02/18/2021)   PRAPARE - Administrator, Civil Service (Medical): No    Lack of Transportation (Non-Medical): No  Physical Activity: Inactive (02/18/2021)   Exercise Vital Sign    Days of Exercise per Week: 0 days    Minutes of Exercise per Session: 0 min  Stress: No Stress Concern Present (02/18/2021)   [employer] of Occupational Health - Occupational Stress Questionnaire    Feeling of Stress : Not at all  Social Connections: Moderately Isolated (02/18/2021)   Social Connection and Isolation Panel [NHANES]    Frequency of Communication with Friends and Family: More than three times a week    Frequency of Social Gatherings with Friends and Family: Three times a week    Attends Religious Services: Never    Active Member of Clubs or Organizations: No    Attends Banker Meetings: Never    Marital Status: Married  Catering manager Violence: Not At Risk (02/18/2021)   Humiliation, Afraid, Rape, and Kick questionnaire    Fear of Current or Ex-Partner: No    Emotionally Abused: No    Physically Abused: No    Sexually Abused: No    Family History: Family History  Problem Relation Age of Onset   Ovarian cancer Mother  61   Cancer Father 37       unsure of which kind, "it was in his glands"   Hypertension Maternal Grandmother    Stroke Maternal Grandfather    Hypertension Son    Brain cancer Sister    Hypertension Daughter    Heart attack Neg Hx    Colon cancer Neg Hx    Esophageal cancer Neg Hx    Inflammatory bowel disease Neg Hx    Liver disease Neg Hx    Pancreatic cancer Neg Hx    Rectal cancer Neg Hx    Stomach cancer Neg Hx     Current Medications:  Current Outpatient Medications:    amLODipine  (NORVASC ) 5 MG tablet, Take 1 tablet (5 mg total) by mouth daily., Disp: 30 tablet, Rfl: 2   anastrozole  (ARIMIDEX ) 1 MG tablet, Take 1 tablet (1 mg total) by mouth daily., Disp: 90 tablet, Rfl: 3   atorvastatin  (LIPITOR ) 40 MG tablet, TAKE (1) TABLET BY MOUTH ONCE DAILY., Disp: 90 tablet, Rfl: 2   clopidogrel  (PLAVIX ) 75 MG tablet, Take 1 tablet (75 mg total) by mouth daily., Disp: 90 tablet, Rfl: 3   esomeprazole  (NEXIUM ) 20 MG capsule, Take 20 mg by mouth daily. , Disp: , Rfl:    Glycerin, PF, (BIOTRUE LUBRICANT) 0.5 % SOLN, Place 1 drop into both eyes daily as needed (dry eye)., Disp: , Rfl:    HYDROcodone -acetaminophen  (NORCO/VICODIN) 5-325 MG tablet, Take 1 tablet by mouth every 6 (six) hours as needed for moderate pain (pain score 4-6)., Disp: 10 tablet, Rfl: 0   hydrOXYzine  (  ATARAX ) 25 MG tablet, TAKE (1) TABLET BY MOUTH AT BEDTIME AS NEEDED, Disp: 30 tablet, Rfl: 0   levothyroxine  (SYNTHROID ) 50 MCG tablet, Take 0.5 tablets (25 mcg total) by mouth daily., Disp: 30 tablet, Rfl: 3   metoprolol  tartrate (LOPRESSOR ) 25 MG tablet, TAKE (1) TABLET BY MOUTH TWICE DAILY., Disp: 120 tablet, Rfl: 3   nitroGLYCERIN  (NITROSTAT ) 0.4 MG SL tablet, DISSOLVE 1 TABLET SUBLINGUALLY AS NEEDED FOR CHEST PAIN, MAY REPEAT EVERY 5 MINUTES. AFTER 3 CALL 911., Disp: 25 tablet, Rfl: 4   nivolumab  (OPDIVO ) 100 MG/10ML SOLN chemo injection, Inject 100 mg into the vein every 30 (thirty) days., Disp: , Rfl:    Vitamin D ,  Ergocalciferol , (DRISDOL ) 1.25 MG (50000 UNIT) CAPS capsule, Take 1 capsule (50,000 Units total) by mouth once a week., Disp: 4 capsule, Rfl: 0 No current facility-administered medications for this visit.  Facility-Administered Medications Ordered in Other Visits:    sodium chloride  flush (NS) 0.9 % injection 10 mL, 10 mL, Intracatheter, PRN, Coco Sharpnack, MD, 10 mL at 11/26/22 1631   sodium chloride  flush (NS) 0.9 % injection 10 mL, 10 mL, Intracatheter, PRN, Fariha Goto, MD, 10 mL at 03/02/23 1120   Allergies: Allergies  Allergen Reactions   Crab [Shellfish Allergy] Nausea And Vomiting    Throws up violently   Ezetimibe Diarrhea   Keflex  [Cephalexin ]     Caused sores   Other Nausea And Vomiting    Shrimp/ Fried food   Penicillins Diarrhea   Shrimp Extract     Other Reaction(s): GI Intolerance    REVIEW OF SYSTEMS:   Review of Systems  Constitutional:  Negative for chills, fatigue and fever.  HENT:   Negative for lump/mass, mouth sores, nosebleeds, sore throat and trouble swallowing.   Eyes:  Negative for eye problems.  Respiratory:  Positive for shortness of breath. Negative for cough.   Cardiovascular:  Negative for chest pain, leg swelling and palpitations.  Gastrointestinal:  Positive for diarrhea. Negative for abdominal pain, constipation, nausea and vomiting.  Genitourinary:  Negative for bladder incontinence, difficulty urinating, dysuria, frequency, hematuria and nocturia.   Musculoskeletal:  Negative for arthralgias, back pain, flank pain, myalgias and neck pain.  Skin:  Negative for itching and rash.  Neurological:  Negative for dizziness, headaches and numbness.  Hematological:  Does not bruise/bleed easily.  Psychiatric/Behavioral:  Negative for depression, sleep disturbance and suicidal ideas. The patient is not nervous/anxious.   All other systems reviewed and are negative.    VITALS:   Weight 157 lb 10.1 oz (71.5 kg).  Wt Readings from Last  3 Encounters:  04/03/24 157 lb 10.1 oz (71.5 kg)  03/06/24 161 lb 4.8 oz (73.2 kg)  03/01/24 155 lb (70.3 kg)    Body mass index is 27.92 kg/m.  Performance status (ECOG): 1 - Symptomatic but completely ambulatory  PHYSICAL EXAM:   Physical Exam Vitals and nursing note reviewed. Exam conducted with a chaperone present.  Constitutional:      Appearance: Normal appearance.  Cardiovascular:     Rate and Rhythm: Normal rate and regular rhythm.     Pulses: Normal pulses.     Heart sounds: Normal heart sounds.  Pulmonary:     Effort: Pulmonary effort is normal.     Breath sounds: Normal breath sounds.  Abdominal:     Palpations: Abdomen is soft. There is no hepatomegaly, splenomegaly or mass.     Tenderness: There is no abdominal tenderness.  Musculoskeletal:     Right lower leg:  No edema.     Left lower leg: No edema.  Lymphadenopathy:     Cervical: No cervical adenopathy.     Right cervical: No superficial, deep or posterior cervical adenopathy.    Left cervical: No superficial, deep or posterior cervical adenopathy.     Upper Body:     Right upper body: No supraclavicular or axillary adenopathy.     Left upper body: No supraclavicular or axillary adenopathy.  Neurological:     General: No focal deficit present.     Mental Status: She is alert and oriented to person, place, and time.  Psychiatric:        Mood and Affect: Mood normal.        Behavior: Behavior normal.     LABS:      Latest Ref Rng & Units 04/03/2024   12:24 PM 03/06/2024    1:37 PM 03/01/2024    6:55 AM  CBC  WBC 4.0 - 10.5 K/uL 4.8  6.5    Hemoglobin 12.0 - 15.0 g/dL 18.2  99.3  71.6   Hematocrit 36.0 - 46.0 % 39.2  38.3  42.0   Platelets 150 - 400 K/uL 169  157        Latest Ref Rng & Units 04/03/2024   12:24 PM 03/06/2024    1:37 PM 03/01/2024    6:55 AM  CMP  Glucose 70 - 99 mg/dL 967  893  810   BUN 8 - 23 mg/dL 20  24  23    Creatinine 0.44 - 1.00 mg/dL 1.75  1.02  5.85   Sodium 135 - 145  mmol/L 137  138  141   Potassium 3.5 - 5.1 mmol/L 3.9  3.5  4.4   Chloride 98 - 111 mmol/L 106  104  106   CO2 22 - 32 mmol/L 21  22    Calcium  8.9 - 10.3 mg/dL 9.7  9.7    Total Protein 6.5 - 8.1 g/dL 7.0  6.7    Total Bilirubin 0.0 - 1.2 mg/dL 0.5  0.7    Alkaline Phos 38 - 126 U/L 82  78    AST 15 - 41 U/L 18  19    ALT 0 - 44 U/L 12  10       Lab Results  Component Value Date   CEA1 9.5 (H) 03/06/2024   CEA 39.5 (H) 08/09/2019   /  CEA  Date Value Ref Range Status  03/06/2024 9.5 (H) 0.0 - 4.7 ng/mL Final    Comment:    (NOTE)                             Nonsmokers          <3.9                             Smokers             <5.6 Roche [employer]ostics Electrochemiluminescence Immunoassay (ECLIA) Values obtained with different assay methods or kits cannot be used interchangeably.  Results cannot be interpreted as absolute evidence of the presence or absence of malignant disease. Performed At: [facility] [address] [place], [place] [id number] Pearlean Botts MD [id number]   08/09/2019 39.5 (H) ng/mL Final    Comment:    Non-Smoker: <2.5 Smoker:     <5.0 . . This test was performed using the Siemens  chemiluminescent method. Values obtained from different assay methods cannot be used interchangeably. CEA levels, regardless of value, should not be interpreted as absolute evidence of the presence or absence of disease. .    No results found for: "PSA1" No results found for: "VWU981" No results found for: "CAN125"  No results found for: "TOTALPROTELP", "ALBUMINELP", "A1GS", "A2GS", "BETS", "BETA2SER", "GAMS", "MSPIKE", "SPEI" Lab Results  Component Value Date   TIBC 357 06/04/2022   TIBC 269 11/22/2019   TIBC 271 10/23/2019   FERRITIN 10 (L) 06/04/2022   FERRITIN 230 11/22/2019   FERRITIN 47 10/23/2019   IRONPCTSAT 9 (L) 06/04/2022   IRONPCTSAT 31 11/22/2019   IRONPCTSAT 11 10/23/2019   No results found for: "LDH"   STUDIES:   CT CHEST  ABDOMEN PELVIS WO CONTRAST Result Date: 03/28/2024 EXAMINATION: CT CHEST ABDOMEN PELVIS WO CONTRAST CLINICAL INDICATION: Female, 83 years old. Metastatic disease evaluation TECHNIQUE: Helical CT scan examination of the chest, abdomen, and pelvis is performed from the domes of the diaphragm to the pubic symphysis. Unless otherwise specified, incidental thyroid , adrenal, renal lesions do not require dedicated imaging follow up. Additionally, any mentioned pulmonary nodules do not require dedicated imaging follow-up based on the Fleischner guidelines unless otherwise specified. Coronary calcifications are not identified unless otherwise specified. COMPARISON: 11/09/2023 FINDINGS: CHEST: Left chest wall Mediport catheter tip terminates in the SVC. The visualized thyroid  is normal. The thoracic aorta is normal in caliber. Scattered calcified metastatic changes are present. The main pulmonary artery is normal in caliber. The heart is normal in size. There are coronary calcifications with post-CABG changes. Small hiatal hernia. No free fluid or adenopathy. The trachea and mainstem bronchi are patent. Mild fibrotic changes are noted within the lung bases. There is a stable 8 mm left lower lobe pulmonary nodule (series 2 image 25). ABDOMEN/PELVIS: The liver contains a small cyst. There is cholelithiasis. The spleen is normal. The pancreas is normal. The adrenals are normal. The right kidney is normal. 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 Scattered calcified atherosclerotic changes are present. The bladder is normal. Uterus is normal. There is colonic diverticulosis. Right hemicolectomy noted. Large and small bowel loops are otherwise within normal limits. There is no free fluid or adenopathy. BONES: There are degenerative changes of the spine and bony pelvis. There is diffuse osseous demineralization. IMPRESSION: Stable 8 mm left lower lobe pulmonary nodule. No  convincing evidence for malignancy within the abdomen or pelvis. 3.2 cm infrarenal abdominal aortic aneurysm. Recommend follow-up in 3 years. DOSE REDUCTION: All CT scans are performed using radiation dose reduction techniques, when applicable. Technical factors are evaluated and adjusted to ensure appropriate moderation of exposure. Electronically signed by: [place] Engel MD 03/28/2024 02:32 PM EDT RP Workstation: [id number]

## 2024-04-03 NOTE — Progress Notes (Signed)
 Patient has been examined by Dr. Cheree Cords. Vital signs and labs have been reviewed by MD - ANC, Creatinine (2.6), LFTs, hemoglobin, and platelets are within treatment parameters per M.D. - pt may proceed with treatment.  Primary RN and pharmacy notified.

## 2024-04-03 NOTE — Patient Instructions (Signed)
 [facility] [place] - A DEPT OF [facility]  Discharge Instructions: Thank you for choosing [facility] to provide your oncology and hematology care.  If you have a lab appointment with the Cancer Center - please note that after April 8th, 2024, all labs will be drawn in the cancer center.  You do not have to check in or register with the main entrance as you have in the past but will complete your check-in in the cancer center.  Wear comfortable clothing and clothing appropriate for easy access to any Portacath or PICC line.   We strive to give you quality time with your provider. You may need to reschedule your appointment if you arrive late (15 or more minutes).  Arriving late affects you and other patients whose appointments are after yours.  Also, if you miss three or more appointments without notifying the office, you may be dismissed from the clinic at the provider's discretion.      For prescription refill requests, have your pharmacy contact our office and allow 72 hours for refills to be completed.    Today you received the following chemotherapy and/or immunotherapy agents Opdivo   To help prevent nausea and vomiting after your treatment, we encourage you to take your nausea medication as directed.  Nivolumab Injection What is this medication? NIVOLUMAB (nye VOL ue mab) treats some types of cancer. It works by helping your immune system slow or stop the spread of cancer cells. It is a monoclonal antibody. This medicine may be used for other purposes; ask your health care provider or pharmacist if you have questions. COMMON BRAND NAME(S): Opdivo What should I tell my care team before I take this medication? They need to know if you have any of these conditions: Allogeneic stem cell transplant (uses someone else's stem cells) Autoimmune diseases, such as Crohn disease, ulcerative colitis, lupus History of chest radiation Nervous system problems,  such as Guillain-Barre syndrome or myasthenia gravis Organ transplant An unusual or allergic reaction to nivolumab, other medications, foods, dyes, or preservatives Pregnant or trying to get pregnant Breast-feeding How should I use this medication? This medication is infused into a vein. It is given in a hospital or clinic setting. A special MedGuide will be given to you before each treatment. Be sure to read this information carefully each time. Talk to your care team about the use of this medication in children. While it may be prescribed for children as young as 12 years for selected conditions, precautions do apply. Overdosage: If you think you have taken too much of this medicine contact a poison control center or emergency room at once. NOTE: This medicine is only for you. Do not share this medicine with others. What if I miss a dose? Keep appointments for follow-up doses. It is important not to miss your dose. Call your care team if you are unable to keep an appointment. What may interact with this medication? Interactions have not been studied. This list may not describe all possible interactions. Give your health care provider a list of all the medicines, herbs, non-prescription drugs, or dietary supplements you use. Also tell them if you smoke, drink alcohol, or use illegal drugs. Some items may interact with your medicine. What should I watch for while using this medication? Your condition will be monitored carefully while you are receiving this medication. You may need blood work while taking this medication. This medication may cause serious skin reactions. They  can happen weeks to months after starting the medication. Contact your care team right away if you notice fevers or flu-like symptoms with a rash. The rash may be red or purple and then turn into blisters or peeling of the skin. You may also notice a red rash with swelling of the face, lips, or lymph nodes in your neck or under  your arms. Tell your care team right away if you have any change in your eyesight. Talk to your care team if you are pregnant or think you might be pregnant. A negative pregnancy test is required before starting this medication. A reliable form of contraception is recommended while taking this medication and for 5 months after the last dose. Talk to your care team about effective forms of contraception. Do not breast-feed while taking this medication and for 5 months after the last dose. What side effects may I notice from receiving this medication? Side effects that you should report to your care team as soon as possible: Allergic reactions--skin rash, itching, hives, swelling of the face, lips, tongue, or throat Dry cough, shortness of breath or trouble breathing Eye pain, redness, irritation, or discharge with blurry or decreased vision Heart muscle inflammation--unusual weakness or fatigue, shortness of breath, chest pain, fast or irregular heartbeat, dizziness, swelling of the ankles, feet, or hands Hormone gland problems--headache, sensitivity to light, unusual weakness or fatigue, dizziness, fast or irregular heartbeat, increased sensitivity to cold or heat, excessive sweating, constipation, hair loss, increased thirst or amount of urine, tremors or shaking, irritability Infusion reactions--chest pain, shortness of breath or trouble breathing, feeling faint or lightheaded Kidney injury (glomerulonephritis)--decrease in the amount of urine, red or dark brown urine, foamy or bubbly urine, swelling of the ankles, hands, or feet Liver injury--right upper belly pain, loss of appetite, nausea, light-colored stool, dark yellow or brown urine, yellowing skin or eyes, unusual weakness or fatigue Pain, tingling, or numbness in the hands or feet, muscle weakness, change in vision, confusion or trouble speaking, loss of balance or coordination, trouble walking, seizures Rash, fever, and swollen lymph  nodes Redness, blistering, peeling, or loosening of the skin, including inside the mouth Sudden or severe stomach pain, bloody diarrhea, fever, nausea, vomiting Side effects that usually do not require medical attention (report these to your care team if they continue or are bothersome): Bone, joint, or muscle pain Diarrhea Fatigue Loss of appetite Nausea Skin rash This list may not describe all possible side effects. Call your doctor for medical advice about side effects. You may report side effects to FDA at 1-800-FDA-1088. Where should I keep my medication? This medication is given in a hospital or clinic. It will not be stored at home. NOTE: This sheet is a summary. It may not cover all possible information. If you have questions about this medicine, talk to your doctor, pharmacist, or health care provider.  2024 Elsevier/Gold Standard (2022-03-09 00:00:00)   BELOW ARE SYMPTOMS THAT SHOULD BE REPORTED IMMEDIATELY: *FEVER GREATER THAN 100.4 F (38 C) OR HIGHER *CHILLS OR SWEATING *NAUSEA AND VOMITING THAT IS NOT CONTROLLED WITH YOUR NAUSEA MEDICATION *UNUSUAL SHORTNESS OF BREATH *UNUSUAL BRUISING OR BLEEDING *URINARY PROBLEMS (pain or burning when urinating, or frequent urination) *BOWEL PROBLEMS (unusual diarrhea, constipation, pain near the anus) TENDERNESS IN MOUTH AND THROAT WITH OR WITHOUT PRESENCE OF ULCERS (sore throat, sores in mouth, or a toothache) UNUSUAL RASH, SWELLING OR PAIN  UNUSUAL VAGINAL DISCHARGE OR ITCHING   Items with * indicate a potential emergency and should be  followed up as soon as possible or go to the Emergency Department if any problems should occur.  Please show the CHEMOTHERAPY ALERT CARD or IMMUNOTHERAPY ALERT CARD at check-in to the Emergency Department and triage nurse.  Should you have questions after your visit or need to cancel or reschedule your appointment, please contact [facility] [place] - A DEPT OF [facility]  [phone]  and follow the prompts.  Office hours are 8:00 a.m. to 4:30 p.m. Monday - Friday. Please note that voicemails left after 4:00 p.m. may not be returned until the following business day.  We are closed weekends and major holidays. You have access to a nurse at all times for urgent questions. Please call the main number to the clinic [phone] and follow the prompts.  For any non-urgent questions, you may also contact your provider using MyChart. We now offer e-Visits for anyone 27 and older to request care online for non-urgent symptoms. For details visit [email or web address].   Also download the MyChart app! Go to the app store, search "MyChart", open the app, select [facility], and log in with your MyChart username and password.

## 2024-04-04 ENCOUNTER — Encounter

## 2024-04-04 ENCOUNTER — Other Ambulatory Visit: Payer: Self-pay

## 2024-04-04 LAB — T4: T4, Total: 6.1 ug/dL (ref 4.5–12.0)

## 2024-04-05 ENCOUNTER — Other Ambulatory Visit: Payer: Self-pay

## 2024-04-05 DIAGNOSIS — N186 End stage renal disease: Secondary | ICD-10-CM

## 2024-04-07 ENCOUNTER — Other Ambulatory Visit: Payer: Self-pay

## 2024-04-17 ENCOUNTER — Other Ambulatory Visit: Payer: Self-pay | Admitting: Hematology

## 2024-04-18 ENCOUNTER — Encounter: Payer: Self-pay | Admitting: Hematology

## 2024-04-18 ENCOUNTER — Encounter

## 2024-04-19 ENCOUNTER — Other Ambulatory Visit: Payer: Self-pay

## 2024-04-19 ENCOUNTER — Other Ambulatory Visit: Payer: Self-pay | Admitting: Hematology

## 2024-04-19 DIAGNOSIS — R809 Proteinuria, unspecified: Secondary | ICD-10-CM | POA: Diagnosis not present

## 2024-04-19 DIAGNOSIS — E611 Iron deficiency: Secondary | ICD-10-CM | POA: Diagnosis not present

## 2024-04-19 DIAGNOSIS — N189 Chronic kidney disease, unspecified: Secondary | ICD-10-CM | POA: Diagnosis not present

## 2024-04-19 DIAGNOSIS — D631 Anemia in chronic kidney disease: Secondary | ICD-10-CM | POA: Diagnosis not present

## 2024-04-19 DIAGNOSIS — E211 Secondary hyperparathyroidism, not elsewhere classified: Secondary | ICD-10-CM | POA: Diagnosis not present

## 2024-04-21 DIAGNOSIS — R809 Proteinuria, unspecified: Secondary | ICD-10-CM | POA: Diagnosis not present

## 2024-04-21 DIAGNOSIS — I770 Arteriovenous fistula, acquired: Secondary | ICD-10-CM | POA: Diagnosis not present

## 2024-04-21 DIAGNOSIS — I129 Hypertensive chronic kidney disease with stage 1 through stage 4 chronic kidney disease, or unspecified chronic kidney disease: Secondary | ICD-10-CM | POA: Diagnosis not present

## 2024-04-21 DIAGNOSIS — N184 Chronic kidney disease, stage 4 (severe): Secondary | ICD-10-CM | POA: Diagnosis not present

## 2024-04-27 ENCOUNTER — Other Ambulatory Visit: Payer: Self-pay | Admitting: Hematology

## 2024-05-01 ENCOUNTER — Inpatient Hospital Stay

## 2024-05-01 ENCOUNTER — Ambulatory Visit: Admitting: Hematology

## 2024-05-01 ENCOUNTER — Inpatient Hospital Stay: Attending: Hematology

## 2024-05-01 ENCOUNTER — Encounter: Payer: Self-pay | Admitting: Hematology

## 2024-05-01 ENCOUNTER — Other Ambulatory Visit

## 2024-05-01 VITALS — BP 145/63 | HR 58 | Temp 98.0°F | Resp 18

## 2024-05-01 DIAGNOSIS — C652 Malignant neoplasm of left renal pelvis: Secondary | ICD-10-CM | POA: Diagnosis not present

## 2024-05-01 DIAGNOSIS — C642 Malignant neoplasm of left kidney, except renal pelvis: Secondary | ICD-10-CM

## 2024-05-01 DIAGNOSIS — Z79811 Long term (current) use of aromatase inhibitors: Secondary | ICD-10-CM | POA: Insufficient documentation

## 2024-05-01 DIAGNOSIS — Z1732 Human epidermal growth factor receptor 2 negative status: Secondary | ICD-10-CM | POA: Insufficient documentation

## 2024-05-01 DIAGNOSIS — R911 Solitary pulmonary nodule: Secondary | ICD-10-CM | POA: Diagnosis not present

## 2024-05-01 DIAGNOSIS — Z79620 Long term (current) use of immunosuppressive biologic: Secondary | ICD-10-CM | POA: Diagnosis not present

## 2024-05-01 DIAGNOSIS — C182 Malignant neoplasm of ascending colon: Secondary | ICD-10-CM | POA: Diagnosis not present

## 2024-05-01 DIAGNOSIS — Z1721 Progesterone receptor positive status: Secondary | ICD-10-CM | POA: Diagnosis not present

## 2024-05-01 DIAGNOSIS — C50911 Malignant neoplasm of unspecified site of right female breast: Secondary | ICD-10-CM | POA: Diagnosis not present

## 2024-05-01 DIAGNOSIS — Z5112 Encounter for antineoplastic immunotherapy: Secondary | ICD-10-CM | POA: Insufficient documentation

## 2024-05-01 DIAGNOSIS — Z170 Estrogen receptor positive status [ER+]: Secondary | ICD-10-CM | POA: Diagnosis not present

## 2024-05-01 LAB — COMPREHENSIVE METABOLIC PANEL WITH GFR
ALT: 13 U/L (ref 0–44)
AST: 19 U/L (ref 15–41)
Albumin: 3.7 g/dL (ref 3.5–5.0)
Alkaline Phosphatase: 79 U/L (ref 38–126)
Anion gap: 10 (ref 5–15)
BUN: 21 mg/dL (ref 8–23)
CO2: 20 mmol/L — ABNORMAL LOW (ref 22–32)
Calcium: 9.5 mg/dL (ref 8.9–10.3)
Chloride: 107 mmol/L (ref 98–111)
Creatinine, Ser: 2.53 mg/dL — ABNORMAL HIGH (ref 0.44–1.00)
GFR, Estimated: 18 mL/min — ABNORMAL LOW (ref >60)
Glucose, Bld: 123 mg/dL — ABNORMAL HIGH (ref 70–99)
Potassium: 3.5 mmol/L (ref 3.5–5.1)
Sodium: 137 mmol/L (ref 135–145)
Total Bilirubin: 0.6 mg/dL (ref 0.0–1.2)
Total Protein: 6.7 g/dL (ref 6.5–8.1)

## 2024-05-01 LAB — CBC WITH DIFFERENTIAL/PLATELET
Abs Immature Granulocytes: 0.02 10*3/uL (ref 0.00–0.07)
Basophils Absolute: 0.1 10*3/uL (ref 0.0–0.1)
Basophils Relative: 1 %
Eosinophils Absolute: 0.1 10*3/uL (ref 0.0–0.5)
Eosinophils Relative: 1 %
HCT: 39.1 % (ref 36.0–46.0)
Hemoglobin: 12.2 g/dL (ref 12.0–15.0)
Immature Granulocytes: 0 %
Lymphocytes Relative: 16 %
Lymphs Abs: 0.9 10*3/uL (ref 0.7–4.0)
MCH: 28.4 pg (ref 26.0–34.0)
MCHC: 31.2 g/dL (ref 30.0–36.0)
MCV: 91.1 fL (ref 80.0–100.0)
Monocytes Absolute: 0.4 10*3/uL (ref 0.1–1.0)
Monocytes Relative: 7 %
Neutro Abs: 4.2 10*3/uL (ref 1.7–7.7)
Neutrophils Relative %: 75 %
Platelets: 151 10*3/uL (ref 150–400)
RBC: 4.29 MIL/uL (ref 3.87–5.11)
RDW: 14.5 % (ref 11.5–15.5)
WBC: 5.7 10*3/uL (ref 4.0–10.5)
nRBC: 0 % (ref 0.0–0.2)

## 2024-05-01 LAB — MAGNESIUM: Magnesium: 1.8 mg/dL (ref 1.7–2.4)

## 2024-05-01 LAB — TSH: TSH: 5.105 u[IU]/mL — ABNORMAL HIGH (ref 0.350–4.500)

## 2024-05-01 MED ORDER — SODIUM CHLORIDE 0.9% FLUSH
10.0000 mL | INTRAVENOUS | Status: DC | PRN
  Administered 2024-05-01: 10 mL via INTRAVENOUS

## 2024-05-01 MED ORDER — SODIUM CHLORIDE 0.9 % IV SOLN: Freq: Once | INTRAVENOUS | Status: AC

## 2024-05-01 MED ORDER — SODIUM CHLORIDE 0.9 % IV SOLN
480.0000 mg | Freq: Once | INTRAVENOUS | Status: AC
  Administered 2024-05-01: 480 mg via INTRAVENOUS
  Filled 2024-05-01: qty 48

## 2024-05-01 MED ORDER — SODIUM CHLORIDE 0.9% FLUSH
10.0000 mL | INTRAVENOUS | Status: DC | PRN
  Administered 2024-05-01: 10 mL

## 2024-05-01 MED ORDER — HEPARIN SOD (PORK) LOCK FLUSH 100 UNIT/ML IV SOLN
500.0000 [IU] | Freq: Once | INTRAVENOUS | Status: AC | PRN
  Administered 2024-05-01: 500 [IU]

## 2024-05-01 NOTE — Progress Notes (Signed)
 Patient presents today for Opdivo infusion. Patient is in satisfactory condition with no new complaints voiced.  Vital signs are stable.  Labs reviewed and all labs are within treatment parameters.  We will proceed with treatment per MD orders.    Treatment given today per MD orders. Tolerated infusion without adverse affects. Vital signs stable. No complaints at this time. Discharged from clinic ambulatory in stable condition. Alert and oriented x 3. F/U with [facility] as scheduled.

## 2024-05-01 NOTE — Patient Instructions (Signed)
 [facility] [place] - A DEPT OF [facility]  Discharge Instructions: Thank you for choosing [facility] to provide your oncology and hematology care.  If you have a lab appointment with the Cancer Center - please note that after April 8th, 2024, all labs will be drawn in the cancer center.  You do not have to check in or register with the main entrance as you have in the past but will complete your check-in in the cancer center.  Wear comfortable clothing and clothing appropriate for easy access to any Portacath or PICC line.   We strive to give you quality time with your provider. You may need to reschedule your appointment if you arrive late (15 or more minutes).  Arriving late affects you and other patients whose appointments are after yours.  Also, if you miss three or more appointments without notifying the office, you may be dismissed from the clinic at the provider's discretion.      For prescription refill requests, have your pharmacy contact our office and allow 72 hours for refills to be completed.    Today you received the following chemotherapy and/or immunotherapy agents Opdivo   To help prevent nausea and vomiting after your treatment, we encourage you to take your nausea medication as directed.  Nivolumab Injection What is this medication? NIVOLUMAB (nye VOL ue mab) treats some types of cancer. It works by helping your immune system slow or stop the spread of cancer cells. It is a monoclonal antibody. This medicine may be used for other purposes; ask your health care provider or pharmacist if you have questions. COMMON BRAND NAME(S): Opdivo What should I tell my care team before I take this medication? They need to know if you have any of these conditions: Allogeneic stem cell transplant (uses someone else's stem cells) Autoimmune diseases, such as Crohn disease, ulcerative colitis, lupus History of chest radiation Nervous system problems,  such as Guillain-Barre syndrome or myasthenia gravis Organ transplant An unusual or allergic reaction to nivolumab, other medications, foods, dyes, or preservatives Pregnant or trying to get pregnant Breast-feeding How should I use this medication? This medication is infused into a vein. It is given in a hospital or clinic setting. A special MedGuide will be given to you before each treatment. Be sure to read this information carefully each time. Talk to your care team about the use of this medication in children. While it may be prescribed for children as young as 12 years for selected conditions, precautions do apply. Overdosage: If you think you have taken too much of this medicine contact a poison control center or emergency room at once. NOTE: This medicine is only for you. Do not share this medicine with others. What if I miss a dose? Keep appointments for follow-up doses. It is important not to miss your dose. Call your care team if you are unable to keep an appointment. What may interact with this medication? Interactions have not been studied. This list may not describe all possible interactions. Give your health care provider a list of all the medicines, herbs, non-prescription drugs, or dietary supplements you use. Also tell them if you smoke, drink alcohol, or use illegal drugs. Some items may interact with your medicine. What should I watch for while using this medication? Your condition will be monitored carefully while you are receiving this medication. You may need blood work while taking this medication. This medication may cause serious skin reactions. They  can happen weeks to months after starting the medication. Contact your care team right away if you notice fevers or flu-like symptoms with a rash. The rash may be red or purple and then turn into blisters or peeling of the skin. You may also notice a red rash with swelling of the face, lips, or lymph nodes in your neck or under  your arms. Tell your care team right away if you have any change in your eyesight. Talk to your care team if you are pregnant or think you might be pregnant. A negative pregnancy test is required before starting this medication. A reliable form of contraception is recommended while taking this medication and for 5 months after the last dose. Talk to your care team about effective forms of contraception. Do not breast-feed while taking this medication and for 5 months after the last dose. What side effects may I notice from receiving this medication? Side effects that you should report to your care team as soon as possible: Allergic reactions--skin rash, itching, hives, swelling of the face, lips, tongue, or throat Dry cough, shortness of breath or trouble breathing Eye pain, redness, irritation, or discharge with blurry or decreased vision Heart muscle inflammation--unusual weakness or fatigue, shortness of breath, chest pain, fast or irregular heartbeat, dizziness, swelling of the ankles, feet, or hands Hormone gland problems--headache, sensitivity to light, unusual weakness or fatigue, dizziness, fast or irregular heartbeat, increased sensitivity to cold or heat, excessive sweating, constipation, hair loss, increased thirst or amount of urine, tremors or shaking, irritability Infusion reactions--chest pain, shortness of breath or trouble breathing, feeling faint or lightheaded Kidney injury (glomerulonephritis)--decrease in the amount of urine, red or dark brown urine, foamy or bubbly urine, swelling of the ankles, hands, or feet Liver injury--right upper belly pain, loss of appetite, nausea, light-colored stool, dark yellow or brown urine, yellowing skin or eyes, unusual weakness or fatigue Pain, tingling, or numbness in the hands or feet, muscle weakness, change in vision, confusion or trouble speaking, loss of balance or coordination, trouble walking, seizures Rash, fever, and swollen lymph  nodes Redness, blistering, peeling, or loosening of the skin, including inside the mouth Sudden or severe stomach pain, bloody diarrhea, fever, nausea, vomiting Side effects that usually do not require medical attention (report these to your care team if they continue or are bothersome): Bone, joint, or muscle pain Diarrhea Fatigue Loss of appetite Nausea Skin rash This list may not describe all possible side effects. Call your doctor for medical advice about side effects. You may report side effects to FDA at 1-800-FDA-1088. Where should I keep my medication? This medication is given in a hospital or clinic. It will not be stored at home. NOTE: This sheet is a summary. It may not cover all possible information. If you have questions about this medicine, talk to your doctor, pharmacist, or health care provider.  2024 Elsevier/Gold Standard (2022-03-09 00:00:00)   BELOW ARE SYMPTOMS THAT SHOULD BE REPORTED IMMEDIATELY: *FEVER GREATER THAN 100.4 F (38 C) OR HIGHER *CHILLS OR SWEATING *NAUSEA AND VOMITING THAT IS NOT CONTROLLED WITH YOUR NAUSEA MEDICATION *UNUSUAL SHORTNESS OF BREATH *UNUSUAL BRUISING OR BLEEDING *URINARY PROBLEMS (pain or burning when urinating, or frequent urination) *BOWEL PROBLEMS (unusual diarrhea, constipation, pain near the anus) TENDERNESS IN MOUTH AND THROAT WITH OR WITHOUT PRESENCE OF ULCERS (sore throat, sores in mouth, or a toothache) UNUSUAL RASH, SWELLING OR PAIN  UNUSUAL VAGINAL DISCHARGE OR ITCHING   Items with * indicate a potential emergency and should be  followed up as soon as possible or go to the Emergency Department if any problems should occur.  Please show the CHEMOTHERAPY ALERT CARD or IMMUNOTHERAPY ALERT CARD at check-in to the Emergency Department and triage nurse.  Should you have questions after your visit or need to cancel or reschedule your appointment, please contact [facility] [place] - A DEPT OF [facility]  [phone]  and follow the prompts.  Office hours are 8:00 a.m. to 4:30 p.m. Monday - Friday. Please note that voicemails left after 4:00 p.m. may not be returned until the following business day.  We are closed weekends and major holidays. You have access to a nurse at all times for urgent questions. Please call the main number to the clinic [phone] and follow the prompts.  For any non-urgent questions, you may also contact your provider using MyChart. We now offer e-Visits for anyone 27 and older to request care online for non-urgent symptoms. For details visit [email or web address].   Also download the MyChart app! Go to the app store, search "MyChart", open the app, select [facility], and log in with your MyChart username and password.

## 2024-05-02 LAB — CEA: CEA: 10.1 ng/mL — ABNORMAL HIGH (ref 0.0–4.7)

## 2024-05-02 LAB — T4: T4, Total: 6.2 ug/dL (ref 4.5–12.0)

## 2024-05-12 ENCOUNTER — Other Ambulatory Visit: Payer: Self-pay

## 2024-05-13 ENCOUNTER — Other Ambulatory Visit: Payer: Self-pay

## 2024-05-14 ENCOUNTER — Other Ambulatory Visit: Payer: Self-pay

## 2024-05-16 ENCOUNTER — Other Ambulatory Visit: Payer: Self-pay | Admitting: *Deleted

## 2024-05-16 DIAGNOSIS — N186 End stage renal disease: Secondary | ICD-10-CM

## 2024-05-20 ENCOUNTER — Other Ambulatory Visit: Payer: Self-pay | Admitting: Hematology

## 2024-05-22 ENCOUNTER — Encounter: Payer: Self-pay | Admitting: Hematology

## 2024-05-29 ENCOUNTER — Inpatient Hospital Stay

## 2024-05-29 ENCOUNTER — Inpatient Hospital Stay: Admitting: Hematology

## 2024-05-29 ENCOUNTER — Inpatient Hospital Stay: Attending: Hematology

## 2024-05-29 VITALS — BP 131/59 | HR 59 | Resp 19 | Wt 155.9 lb

## 2024-05-29 DIAGNOSIS — Z87891 Personal history of nicotine dependence: Secondary | ICD-10-CM | POA: Diagnosis not present

## 2024-05-29 DIAGNOSIS — Z79811 Long term (current) use of aromatase inhibitors: Secondary | ICD-10-CM | POA: Diagnosis not present

## 2024-05-29 DIAGNOSIS — C50911 Malignant neoplasm of unspecified site of right female breast: Secondary | ICD-10-CM | POA: Insufficient documentation

## 2024-05-29 DIAGNOSIS — C182 Malignant neoplasm of ascending colon: Secondary | ICD-10-CM

## 2024-05-29 DIAGNOSIS — C642 Malignant neoplasm of left kidney, except renal pelvis: Secondary | ICD-10-CM | POA: Diagnosis not present

## 2024-05-29 DIAGNOSIS — Z1732 Human epidermal growth factor receptor 2 negative status: Secondary | ICD-10-CM | POA: Diagnosis not present

## 2024-05-29 DIAGNOSIS — R911 Solitary pulmonary nodule: Secondary | ICD-10-CM | POA: Insufficient documentation

## 2024-05-29 DIAGNOSIS — Z170 Estrogen receptor positive status [ER+]: Secondary | ICD-10-CM | POA: Insufficient documentation

## 2024-05-29 DIAGNOSIS — C679 Malignant neoplasm of bladder, unspecified: Secondary | ICD-10-CM | POA: Diagnosis not present

## 2024-05-29 DIAGNOSIS — E039 Hypothyroidism, unspecified: Secondary | ICD-10-CM | POA: Insufficient documentation

## 2024-05-29 DIAGNOSIS — Z5112 Encounter for antineoplastic immunotherapy: Secondary | ICD-10-CM | POA: Insufficient documentation

## 2024-05-29 DIAGNOSIS — Z1721 Progesterone receptor positive status: Secondary | ICD-10-CM | POA: Insufficient documentation

## 2024-05-29 DIAGNOSIS — Z79620 Long term (current) use of immunosuppressive biologic: Secondary | ICD-10-CM | POA: Diagnosis not present

## 2024-05-29 DIAGNOSIS — Z79899 Other long term (current) drug therapy: Secondary | ICD-10-CM | POA: Diagnosis not present

## 2024-05-29 DIAGNOSIS — C773 Secondary and unspecified malignant neoplasm of axilla and upper limb lymph nodes: Secondary | ICD-10-CM | POA: Insufficient documentation

## 2024-05-29 LAB — CBC WITH DIFFERENTIAL/PLATELET
Abs Immature Granulocytes: 0.01 K/uL (ref 0.00–0.07)
Basophils Absolute: 0.1 K/uL (ref 0.0–0.1)
Basophils Relative: 1 %
Eosinophils Absolute: 0.1 K/uL (ref 0.0–0.5)
Eosinophils Relative: 2 %
HCT: 40.1 % (ref 36.0–46.0)
Hemoglobin: 12.4 g/dL (ref 12.0–15.0)
Immature Granulocytes: 0 %
Lymphocytes Relative: 24 %
Lymphs Abs: 1.5 K/uL (ref 0.7–4.0)
MCH: 28.1 pg (ref 26.0–34.0)
MCHC: 30.9 g/dL (ref 30.0–36.0)
MCV: 90.9 fL (ref 80.0–100.0)
Monocytes Absolute: 0.5 K/uL (ref 0.1–1.0)
Monocytes Relative: 8 %
Neutro Abs: 4 K/uL (ref 1.7–7.7)
Neutrophils Relative %: 65 %
Platelets: 174 K/uL (ref 150–400)
RBC: 4.41 MIL/uL (ref 3.87–5.11)
RDW: 14.4 % (ref 11.5–15.5)
WBC: 6.2 K/uL (ref 4.0–10.5)
nRBC: 0 % (ref 0.0–0.2)

## 2024-05-29 LAB — COMPREHENSIVE METABOLIC PANEL WITH GFR
ALT: 12 U/L (ref 0–44)
AST: 18 U/L (ref 15–41)
Albumin: 4 g/dL (ref 3.5–5.0)
Alkaline Phosphatase: 79 U/L (ref 38–126)
Anion gap: 11 (ref 5–15)
BUN: 23 mg/dL (ref 8–23)
CO2: 21 mmol/L — ABNORMAL LOW (ref 22–32)
Calcium: 9.7 mg/dL (ref 8.9–10.3)
Chloride: 107 mmol/L (ref 98–111)
Creatinine, Ser: 2.48 mg/dL — ABNORMAL HIGH (ref 0.44–1.00)
GFR, Estimated: 19 mL/min — ABNORMAL LOW (ref >60)
Glucose, Bld: 112 mg/dL — ABNORMAL HIGH (ref 70–99)
Potassium: 3.9 mmol/L (ref 3.5–5.1)
Sodium: 139 mmol/L (ref 135–145)
Total Bilirubin: 0.8 mg/dL (ref 0.0–1.2)
Total Protein: 6.8 g/dL (ref 6.5–8.1)

## 2024-05-29 LAB — TSH: TSH: 4.473 u[IU]/mL (ref 0.350–4.500)

## 2024-05-29 LAB — MAGNESIUM: Magnesium: 2 mg/dL (ref 1.7–2.4)

## 2024-05-29 MED ORDER — SODIUM CHLORIDE 0.9 % IV SOLN
480.0000 mg | Freq: Once | INTRAVENOUS | Status: AC
  Administered 2024-05-29: 480 mg via INTRAVENOUS
  Filled 2024-05-29: qty 48

## 2024-05-29 MED ORDER — HEPARIN SOD (PORK) LOCK FLUSH 100 UNIT/ML IV SOLN
500.0000 [IU] | Freq: Once | INTRAVENOUS | Status: AC | PRN
  Administered 2024-05-29: 500 [IU]

## 2024-05-29 MED ORDER — SODIUM CHLORIDE 0.9 % IV SOLN: Freq: Once | INTRAVENOUS | Status: AC

## 2024-05-29 NOTE — Progress Notes (Signed)
 [facility] [address] [place] [address]    Clinic Day:  05/29/2024  Referring physician: Seabron Lenis, MD  Patient Care Team: Seabron Lenis, MD as PCP - General (Family Medicine) Jeffrie Oneil BROCKS, MD as PCP - Cardiology (Cardiology) Sheldon Standing, MD as Consulting Physician (General Surgery) Jeffrie Oneil BROCKS, MD as Consulting Physician (Cardiology) Mansouraty, Aloha Raddle., MD as Consulting Physician (Gastroenterology) Wilson, Diane G, RN as Oncology Nurse Navigator Rogers Hai, MD as Consulting Physician (Medical Oncology)   ASSESSMENT & PLAN:   Assessment: 1.  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  Right hemicolectomy on 10/11/2019, grade 3 adenocarcinoma, negative margins, positive LVSI, 2 tumor deposits, 0/15 lymph nodes positive, PT3PN1C. 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  MLH1 hyper methylation present. -PET scan in December 2020 showed lymph node in the right axillary region.  Biopsy consistent with metastatic colon cancer. -Last CEA was 10.7 on 11/22/2019. -Right axillary lymph node excision on 04/02/2020 consistent with metastatic carcinoma from colon cancer. -Pembrolizumab  started on 05/14/2020. -PET scan on 08/05/2020 shows complete resolution of lymphadenopathy.  No evidence of new areas of uptake.  Mild vague residual hypermetabolism in the right axilla without soft tissue mass. -PET scan on 12/23/2020 with no evidence of recurrence. - Keytruda  held since we started opdivo  on 08/27/2022   2.  Stage I (PT1CPN0) right Breast, Grade 2 Invasive Lobular Carcinoma: -MRI of the breast on 12/25/2019 showed 1 cm mass behind the right areola with a suspicious mass in the left breast. -Right breast lumpectomy on 04/02/2020 shows invasive lobular carcinoma, grade 2, 1.2 cm.  Resection margins are negative.  Negative LVSI.  2 sentinel lymph nodes  were negative for carcinoma.  ER/PR 100% positive, HER-2 negative, Ki-67 10%. - Anastrozole  started in June 2021   3.  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 - Based on her renal function, she is not a candidate for cisplatin based chemotherapy.  Nivolumab  was recommended for 1 year. - PET scan (08/06/2022): No evidence of metastatic disease. - Opdivo  for 1 year started on 08/27/2022    Plan: 1.  Stage IV colon cancer to the right axillary lymph node: - CT scan on 03/23/2024: Stable 8 mm left lower lobe lung nodule.  No evidence of metastatic disease in the abdomen or pelvis. - She is tolerating nivolumab  reasonably well.  She has erythematous bumps in her scalp on and off. - She will proceed with her last dose of nivolumab . - We have discussed stopping nivolumab  after today's treatment followed by close monitoring for recurrence.  She has been on pembrolizumab /nivolumab  for 4 years.  If there is any recurrence, we can start her back on immunotherapy.  She is agreeable. - RTC 4 months for follow-up with repeat CT CAP without contrast.   2.  Right breast invasive lobular carcinoma, grade 2: - She is taking anastrozole  daily and tolerating well. - She had abnormal mammogram in November 2024 followed by biopsy which was benign. - She will have repeat mammogram and November 2025.  I will also plan to repeat DEXA scan prior to next  visit as previous scan showed osteoporosis.  She is taking vitamin D  supplements.   3.  Left lung pulmonary nodule: - CT CAP on 11/09/2023 showed left lower lobe lung nodule  measuring 9 mm, previously 7 mm in June 2024 and 4 mm in July 2023. - CT chest on 03/23/2024 shows nodule is stable at 8 mm.   4.  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  Continue surveillance scans.   5.  Hypothyroidism: - She is taking Synthroid  25 mcg daily. - Latest TSH today is normal at 4.4.    Orders Placed This Encounter  Procedures   CT CHEST ABDOMEN PELVIS WO CONTRAST    Standing Status:   Future    Expected Date:   10/02/2024    Expiration Date:   05/29/2025    Preferred imaging location?:   [facility]    If indicated for the ordered procedure, I authorize the administration of oral contrast media per Radiology protocol:   Yes    Does the patient have a contrast media/X-ray dye allergy?:   No   DG Bone Density    Standing Status:   Future    Expected Date:   09/25/2024    Expiration Date:   05/29/2025    Reason for Exam (SYMPTOM  OR DIAGNOSIS REQUIRED):   post menopausal; anti estrogen therapy    Preferred imaging location?:   [facility]   CBC with Differential    Standing Status:   Future    Expected Date:   09/18/2024    Expiration Date:   12/17/2024   Comprehensive metabolic panel    Standing Status:   Future    Expected Date:   09/18/2024    Expiration Date:   12/17/2024   Magnesium     Standing Status:   Future    Expected Date:   09/18/2024    Expiration Date:   12/17/2024   CEA    Standing Status:   Future    Expected Date:   09/18/2024    Expiration Date:   12/17/2024   TSH    Standing Status:   Future    Expected Date:   09/18/2024    Expiration Date:   12/17/2024      LILLETTE Hummingbird R Teague,acting as a scribe for Alean Stands, MD.,have documented all relevant documentation on the behalf of Alean Stands, MD,as directed by  Alean Stands, MD while in the presence of Alean Stands, MD.  I, Alean Stands MD, have reviewed the above  documentation for accuracy and completeness, and I agree with the above.      Alean Stands, MD   7/7/20252:14 PM  CHIEF COMPLAINT:   Diagnosis: stage I right breast cancer and colon cancer and left kidney urothelial carcinoma    Cancer Staging  Cancer of ascending colon s/p robotic proximal colectomy 10/11/2019 Staging form: Colon and Rectum, AJCC 8th Edition - Clinical stage from 10/23/2019: Stage IVA (cT3, cN1c, cM1a) - Signed by Stands Alean, MD on 12/14/2019  Malignant neoplasm of right female breast [facility]) Staging form: Breast, AJCC 8th Edition - Clinical stage from 04/23/2020: Stage IA (cT1c, cN0(sn), cM0, G2, ER+, PR+, HER2-) - Signed by Stands Alean, MD on 04/23/2020  Urothelial carcinoma of kidney, left [facility]) Staging form: Kidney, AJCC 8th Edition - Clinical stage from 08/18/2022: Stage III (cT3, cN0, cM0) - Unsigned    Prior Therapy: 1. Robotic proximal colectomy on 10/11/2019. 2.  Right breast lumpectomy and SLNB on 04/02/2020 3.  Left radical nephro ureterectomy, lymphadenectomy, bladder cuff excision on 07/13/2022  Current Therapy:  Opdivo  every 4 weeks    HISTORY OF PRESENT ILLNESS:   Oncology History  Cancer of ascending colon s/p robotic proximal colectomy 10/11/2019  10/11/2019 Initial Diagnosis   Cancer of ascending colon s/p robotic proximal colectomy 10/11/2019   10/23/2019 Cancer Staging   Staging form: Colon and Rectum, AJCC 8th Edition - Clinical stage from 10/23/2019: Stage IVA (cT3, cN1c, cM1a) - Signed by Rogers Hai, MD on 12/14/2019   05/14/2020 - 06/26/2022 Chemotherapy   Patient is on Treatment Plan : COLORECTAL Pembrolizumab  q21d     05/14/2020 - 06/26/2022 Chemotherapy   Patient is on Treatment Plan : COLORECTAL Pembrolizumab  (200) q21d     08/27/2022 -  Chemotherapy   Patient is on Treatment Plan : COLORECTAL Nivolumab  (480) q28d     Malignant neoplasm of right female breast (HCC)  04/23/2020 Initial Diagnosis    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 [facility])   04/23/2020 Cancer Staging   Staging form: Breast, AJCC 8th Edition - Clinical stage from 04/23/2020: Stage IA (cT1c, cN0(sn), cM0, G2, ER+, PR+, HER2-) - Signed by Rogers Hai, MD on 04/23/2020   Urothelial carcinoma of kidney, left (HCC)  08/18/2022 Initial Diagnosis   Urothelial carcinoma of kidney, left (HCC)   08/27/2022 -  Chemotherapy   Patient is on Treatment Plan : COLORECTAL Nivolumab  (480) q28d        INTERVAL HISTORY:   Soul is a 83 y.o. female presenting to clinic today for follow up of stage I right breast cancer and colon cancer and left kidney urothelial carcinoma. She was last seen by me on 04/03/24.  Today, she states that she is doing well overall. Her appetite level is at 100%. Her energy level is at 75%.  PAST MEDICAL HISTORY:   Past Medical History: Past Medical History:  Diagnosis Date   Abdominal aortic aneurysm (HCC)    Acid reflux    Anemia    Arthritis    knees , R shoulder - tx /w injection - 11/2014   Basal cell carcinoma (BCC) of dorsum of nose 2010   Resolved   Cancer (HCC)    basal cell on nose   Colon cancer (HCC)    Coronary artery disease    Hypercholesteremia    Hypertension    Hypothyroidism    Lt Acute pyelonephritis 09/11/2018   MI, old 2000   Peripheral arterial disease (HCC)    hhigh-grade ostial bilateral calcified iliac stenosis with claudication   Tobacco abuse    Vertigo    when lays on left side.    Surgical History: Past Surgical History:  Procedure Laterality Date   AV FISTULA PLACEMENT Left 03/01/2024   Procedure: BRACHIOCEPHALIC ARTERIOVENOUS (AV) FISTULA CREATION;  Surgeon: Gretta Lonni PARAS, MD;  Location: [facility] OR;  Service: Vascular;  Laterality: Left;   BREAST BIOPSY Right 2021   Invasive lobular carcinoma/ Metastatic carcinoma   BREAST BIOPSY Left 2021   Isolated microscopic foci of atypical ductal hyperplasia   BREAST BIOPSY Right 10/07/2023    MM RT BREAST BX W LOC DEV 1ST LESION IMAGE BX SPEC STEREO GUIDE 10/07/2023 [facility]   BREAST BIOPSY Right 10/07/2023   MM RT BREAST BX W LOC DEV EA AD LESION IMG BX SPEC STEREO GUIDE 10/07/2023 [facility]   BREAST LUMPECTOMY WITH RADIOACTIVE SEED AND SENTINEL LYMPH NODE BIOPSY Bilateral 04/02/2020  Procedure: BILATERAL BREAST LUMPECTOMY WITH RADIOACTIVE SEED AND RIGHT SENTINEL LYMPH NODE BIOPSY AND RIGHT TARGETED AXILLARY LYMPH NODE BIOPSY;  Surgeon: Vanderbilt Ned, MD;  Location: [facility];  Service: General;  Laterality: Bilateral;   CARDIAC CATHETERIZATION  5 stents   CARDIAC CATHETERIZATION N/A 01/20/2016   Procedure: Left Heart Cath and Coronary Angiography;  Surgeon: Peter M Swaziland, MD;  Location: [facility] INVASIVE CV LAB;  Service: Cardiovascular;  Laterality: N/A;   CATARACT EXTRACTION W/PHACO Left 05/24/2014   Procedure: CATARACT EXTRACTION PHACO AND INTRAOCULAR LENS PLACEMENT (IOC);  Surgeon: Cherene Mania, MD;  Location: [facility]S;  Service: Ophthalmology;  Laterality: Left;  CDE:  9.30   CATARACT EXTRACTION W/PHACO Right 06/18/2014   Procedure: CATARACT EXTRACTION PHACO AND INTRAOCULAR LENS PLACEMENT RIGHT EYE CDE=10.84;  Surgeon: Cherene Mania, MD;  Location: [facility];  Service: Ophthalmology;  Laterality: Right;   CORONARY ARTERY BYPASS GRAFT N/A 01/24/2016   Procedure: CORONARY ARTERY BYPASS GRAFTING (CABG);  Surgeon: Dorise MARLA Fellers, MD;  Location: [facility] OR;  Service: Open Heart Surgery;  Laterality: N/A;   CORONARY STENT PLACEMENT     CORONARY STENT PLACEMENT  03/06/2015   CFX DES   CYSTOSCOPY/URETEROSCOPY/HOLMIUM LASER/STENT PLACEMENT Left 09/12/2018   Procedure: CYSTOSCOPY/URETEROSCOPY/STENT PLACEMENT;  Surgeon: Devere Lonni Righter, MD;  Location: [facility]S;  Service: Urology;  Laterality: Left;   EYE SURGERY     LEFT HEART CATH AND CORS/GRAFTS ANGIOGRAPHY N/A 09/12/2019   Procedure: LEFT HEART CATH AND CORS/GRAFTS ANGIOGRAPHY;  Surgeon: Burnard Ned LABOR,  MD;  Location: [facility] INVASIVE CV LAB;  Service: Cardiovascular;  Laterality: N/A;   LEFT HEART CATHETERIZATION WITH CORONARY ANGIOGRAM N/A 03/06/2015   Procedure: LEFT HEART CATHETERIZATION WITH CORONARY ANGIOGRAM;  Surgeon: Dorn JINNY Lesches, MD;  Location[facility] C[facility] LAB;  Service: Cardiovascular;  Laterality: N/A;   PARTIAL KNEE ARTHROPLASTY Right 02/04/2015   Procedure: UNICOMPARTMENTAL KNEE;  Surgeon: Norleen Gavel, MD;  Location: [facility];  Service: Orthopedics;  Laterality: Right;   PERIPHERAL VASCULAR CATHETERIZATION Bilateral 05/13/2015   Procedure: Lower Extremity Angiography;  Surgeon: Dorn JINNY Lesches, MD;  Location: [facility] INVASIVE CV LAB;  Service: Cardiovascular;  Laterality: Bilateral;   PERIPHERAL VASCULAR CATHETERIZATION N/A 05/13/2015   Procedure: Abdominal Aortogram;  Surgeon: Dorn JINNY Lesches, MD;  Location: [facility] INVASIVE CV LAB;  Service: Cardiovascular;  Laterality: N/A;   PERIPHERAL VASCULAR CATHETERIZATION Bilateral 06/27/2015   Procedure: Peripheral Vascular Intervention;  Surgeon: Dorn JINNY Lesches, MD;  Location: [facility] INVASIVE CV LAB;  Service: Cardiovascular;  Laterality: Bilateral;  ILIACS   PERIPHERAL VASCULAR CATHETERIZATION Bilateral 06/27/2015   Procedure: Peripheral Vascular Atherectomy;  Surgeon: Dorn JINNY Lesches, MD;  Location: [facility] INVASIVE CV LAB;  Service: Cardiovascular;  Laterality: Bilateral;   PORTACATH PLACEMENT Left 05/24/2020   Procedure: INSERTION PORT-A-CATH;  Surgeon: Mavis Anes, MD;  Location: [facility];  Service: General;  Laterality: Left;   TEE WITHOUT CARDIOVERSION N/A 01/24/2016   Procedure: TRANSESOPHAGEAL ECHOCARDIOGRAM (TEE);  Surgeon: Dorise MARLA Fellers, MD;  Location: [facility] OR;  Service: Open Heart Surgery;  Laterality: N/A;   TONSILLECTOMY     age 46   TUBAL LIGATION      Social History: Social History   Socioeconomic History   Marital status: Married    Spouse name: Tommy   Number of children: 4   Years of education: Not on file   Highest education level:  Not on file  Occupational History   Occupation: retired    Comment: worked as Tree surgeon for [employer]  Tobacco Use  Smoking status: Former    Current packs/day: 0.00    Average packs/day: 1.5 packs/day for 42.0 years (63.0 ttl pk-yrs)    Types: Cigarettes    Start date: 05/18/1957    Quit date: 05/19/1999    Years since quitting: 25.0   Smokeless tobacco: Never  Vaping Use   Vaping status: Never Used  Substance and Sexual Activity   Alcohol use: No   Drug use: No   Sexual activity: Yes    Birth control/protection: Surgical  Other Topics Concern   Not on file  Social History Narrative   Not on file   Social Drivers of Health   Financial Resource Strain: Medium Risk (02/18/2021)   Overall Financial Resource Strain (CARDIA)    Difficulty of Paying Living Expenses: Somewhat hard  Food Insecurity: No Food Insecurity (02/18/2021)   Hunger Vital Sign    Worried About Running Out of Food in the Last Year: Never true    Ran Out of Food in the Last Year: Never true  Transportation Needs: No Transportation Needs (02/18/2021)   PRAPARE - Administrator, Civil Service (Medical): No    Lack of Transportation (Non-Medical): No  Physical Activity: Inactive (02/18/2021)   Exercise Vital Sign    Days of Exercise per Week: 0 days    Minutes of Exercise per Session: 0 min  Stress: No Stress Concern Present (02/18/2021)   [employer] of Occupational Health - Occupational Stress Questionnaire    Feeling of Stress : Not at all  Social Connections: Moderately Isolated (02/18/2021)   Social Connection and Isolation Panel    Frequency of Communication with Friends and Family: More than three times a week    Frequency of Social Gatherings with Friends and Family: Three times a week    Attends Religious Services: Never    Active Member of Clubs or Organizations: No    Attends Banker Meetings: Never    Marital Status: Married  Catering manager Violence:  Not At Risk (02/18/2021)   Humiliation, Afraid, Rape, and Kick questionnaire    Fear of Current or Ex-Partner: No    Emotionally Abused: No    Physically Abused: No    Sexually Abused: No    Family History: Family History  Problem Relation Age of Onset   Ovarian cancer Mother 10   Cancer Father 67       unsure of which kind, it was in his glands   Hypertension Maternal Grandmother    Stroke Maternal Grandfather    Hypertension Son    Brain cancer Sister    Hypertension Daughter    Heart attack Neg Hx    Colon cancer Neg Hx    Esophageal cancer Neg Hx    Inflammatory bowel disease Neg Hx    Liver disease Neg Hx    Pancreatic cancer Neg Hx    Rectal cancer Neg Hx    Stomach cancer Neg Hx     Current Medications:  Current Outpatient Medications:    amLODipine  (NORVASC ) 5 MG tablet, TAKE ONE TABLET BY MOUTH EVERY DAY, Disp: 30 tablet, Rfl: 2   anastrozole  (ARIMIDEX ) 1 MG tablet, Take 1 tablet (1 mg total) by mouth daily., Disp: 90 tablet, Rfl: 3   atorvastatin  (LIPITOR ) 40 MG tablet, TAKE (1) TABLET BY MOUTH ONCE DAILY., Disp: 90 tablet, Rfl: 2   clopidogrel  (PLAVIX ) 75 MG tablet, Take 1 tablet (75 mg total) by mouth daily., Disp: 90 tablet, Rfl: 3   esomeprazole  (NEXIUM ) 20  MG capsule, Take 20 mg by mouth daily. , Disp: , Rfl:    Glycerin, PF, (BIOTRUE LUBRICANT) 0.5 % SOLN, Place 1 drop into both eyes daily as needed (dry eye)., Disp: , Rfl:    HYDROcodone -acetaminophen  (NORCO/VICODIN) 5-325 MG tablet, Take 1 tablet by mouth every 6 (six) hours as needed for moderate pain (pain score 4-6)., Disp: 10 tablet, Rfl: 0   hydrOXYzine  (ATARAX ) 25 MG tablet, TAKE (1) TABLET BY MOUTH AT BEDTIME AS NEEDED, Disp: 30 tablet, Rfl: 0   levothyroxine  (SYNTHROID ) 50 MCG tablet, Take 0.5 tablets (25 mcg total) by mouth daily., Disp: 30 tablet, Rfl: 3   lidocaine -prilocaine  (EMLA ) cream, Apply 1 Application topically., Disp: , Rfl:    metoprolol  tartrate (LOPRESSOR ) 25 MG tablet, TAKE (1)  TABLET BY MOUTH TWICE DAILY., Disp: 120 tablet, Rfl: 3   nitroGLYCERIN  (NITROSTAT ) 0.4 MG SL tablet, DISSOLVE 1 TABLET SUBLINGUALLY AS NEEDED FOR CHEST PAIN, MAY REPEAT EVERY 5 MINUTES. AFTER 3 CALL 911., Disp: 25 tablet, Rfl: 4   nivolumab  (OPDIVO ) 100 MG/10ML SOLN chemo injection, Inject 100 mg into the vein every 30 (thirty) days., Disp: , Rfl:    Vitamin D , Ergocalciferol , (DRISDOL ) 1.25 MG (50000 UNIT) CAPS capsule, Take 1 capsule (50,000 Units total) by mouth once a week., Disp: 4 capsule, Rfl: 0 No current facility-administered medications for this visit.  Facility-Administered Medications Ordered in Other Visits:    heparin  lock flush 100 unit/mL, 500 Units, Intracatheter, Once PRN, Hameed Kolar, MD   nivolumab  (OPDIVO ) 480 mg in sodium chloride  0.9 % 100 mL chemo infusion, 480 mg, Intravenous, Once, Rogers Hai, MD   sodium chloride  flush (NS) 0.9 % injection 10 mL, 10 mL, Intracatheter, PRN, Aara Jacquot, MD, 10 mL at 11/26/22 1631   sodium chloride  flush (NS) 0.9 % injection 10 mL, 10 mL, Intracatheter, PRN, Corney Knighton, MD, 10 mL at 03/02/23 1120   Allergies: Allergies  Allergen Reactions   Crab [Shellfish Allergy] Nausea And Vomiting    Throws up violently   Ezetimibe Diarrhea   Keflex  [Cephalexin ]     Caused sores   Other Nausea And Vomiting    Shrimp/ Fried food   Penicillins Diarrhea   Shrimp Extract     Other Reaction(s): GI Intolerance    REVIEW OF SYSTEMS:   Review of Systems  Constitutional:  Negative for chills, fatigue and fever.  HENT:   Negative for lump/mass, mouth sores, nosebleeds, sore throat and trouble swallowing.   Eyes:  Negative for eye problems.  Respiratory:  Negative for cough and shortness of breath.   Cardiovascular:  Negative for chest pain, leg swelling and palpitations.  Gastrointestinal:  Positive for diarrhea. Negative for abdominal pain, constipation, nausea and vomiting.  Genitourinary:  Negative for  bladder incontinence, difficulty urinating, dysuria, frequency, hematuria and nocturia.   Musculoskeletal:  Negative for arthralgias, back pain, flank pain, myalgias and neck pain.  Skin:  Negative for itching and rash.  Neurological:  Negative for dizziness, headaches and numbness.  Hematological:  Does not bruise/bleed easily.  Psychiatric/Behavioral:  Negative for depression, sleep disturbance and suicidal ideas. The patient is not nervous/anxious.   All other systems reviewed and are negative.    VITALS:   There were no vitals taken for this visit.  Wt Readings from Last 3 Encounters:  05/29/24 155 lb 13.8 oz (70.7 kg)  04/03/24 157 lb 10.1 oz (71.5 kg)  03/06/24 161 lb 4.8 oz (73.2 kg)    There is no height or weight on file to  calculate BMI.  Performance status (ECOG): 1 - Symptomatic but completely ambulatory  PHYSICAL EXAM:   Physical Exam Vitals and nursing note reviewed. Exam conducted with a chaperone present.  Constitutional:      Appearance: Normal appearance.  Cardiovascular:     Rate and Rhythm: Normal rate and regular rhythm.     Pulses: Normal pulses.     Heart sounds: Normal heart sounds.  Pulmonary:     Effort: Pulmonary effort is normal.     Breath sounds: Normal breath sounds.  Abdominal:     Palpations: Abdomen is soft. There is no hepatomegaly, splenomegaly or mass.     Tenderness: There is no abdominal tenderness.  Musculoskeletal:     Right lower leg: No edema.     Left lower leg: No edema.  Lymphadenopathy:     Cervical: No cervical adenopathy.     Right cervical: No superficial, deep or posterior cervical adenopathy.    Left cervical: No superficial, deep or posterior cervical adenopathy.     Upper Body:     Right upper body: No supraclavicular or axillary adenopathy.     Left upper body: No supraclavicular or axillary adenopathy.  Neurological:     General: No focal deficit present.     Mental Status: She is alert and oriented to person,  place, and time.  Psychiatric:        Mood and Affect: Mood normal.        Behavior: Behavior normal.     LABS:      Latest Ref Rng & Units 05/29/2024   12:17 PM 05/01/2024    1:29 PM 04/03/2024   12:24 PM  CBC  WBC 4.0 - 10.5 K/uL 6.2  5.7  4.8   Hemoglobin 12.0 - 15.0 g/dL 87.5  87.7  87.3   Hematocrit 36.0 - 46.0 % 40.1  39.1  39.2   Platelets 150 - 400 K/uL 174  151  169       Latest Ref Rng & Units 05/29/2024   12:17 PM 05/01/2024    1:29 PM 04/03/2024   12:24 PM  CMP  Glucose 70 - 99 mg/dL 887  876  892   BUN 8 - 23 mg/dL 23  21  20    Creatinine 0.44 - 1.00 mg/dL 7.51  7.46  7.39   Sodium 135 - 145 mmol/L 139  137  137   Potassium 3.5 - 5.1 mmol/L 3.9  3.5  3.9   Chloride 98 - 111 mmol/L 107  107  106   CO2 22 - 32 mmol/L 21  20  21    Calcium  8.9 - 10.3 mg/dL 9.7  9.5  9.7   Total Protein 6.5 - 8.1 g/dL 6.8  6.7  7.0   Total Bilirubin 0.0 - 1.2 mg/dL 0.8  0.6  0.5   Alkaline Phos 38 - 126 U/L 79  79  82   AST 15 - 41 U/L 18  19  18    ALT 0 - 44 U/L 12  13  12       Lab Results  Component Value Date   CEA1 10.1 (H) 05/01/2024   CEA 39.5 (H) 08/09/2019   /  CEA  Date Value Ref Range Status  05/01/2024 10.1 (H) 0.0 - 4.7 ng/mL Final    Comment:    (NOTE)  Nonsmokers          <3.9                             Smokers             <5.6 Roche [employer]ostics Electrochemiluminescence Immunoassay (ECLIA) Values obtained with different assay methods or kits cannot be used interchangeably.  Results cannot be interpreted as absolute evidence of the presence or absence of malignant disease. Performed At: [facility] [address] [place], [place] [id number] Jennette Shorter MD Ey:[id number]   08/09/2019 39.5 (H) ng/mL Final    Comment:    Non-Smoker: <2.5 Smoker:     <5.0 . . This test was performed using the Siemens  chemiluminescent method. Values obtained from different assay methods cannot be used interchangeably. CEA levels,  regardless of value, should not be interpreted as absolute evidence of the presence or absence of disease. .    No results found for: PSA1 No results found for: CAN199 No results found for: CAN125  No results found for: STEPHANY RINGS, A1GS, A2GS, BETS, BETA2SER, GAMS, MSPIKE, SPEI Lab Results  Component Value Date   TIBC 357 06/04/2022   TIBC 269 11/22/2019   TIBC 271 10/23/2019   FERRITIN 10 (L) 06/04/2022   FERRITIN 230 11/22/2019   FERRITIN 47 10/23/2019   IRONPCTSAT 9 (L) 06/04/2022   IRONPCTSAT 31 11/22/2019   IRONPCTSAT 11 10/23/2019   No results found for: LDH   STUDIES:   No results found.

## 2024-05-29 NOTE — Progress Notes (Signed)
 Patient has been examined by Dr. Ellin Saba. Vital signs and labs have been reviewed by MD - ANC, Creatinine, LFTs, hemoglobin, and platelets are within treatment parameters per M.D. - pt may proceed with treatment.  Primary RN and pharmacy notified.

## 2024-05-29 NOTE — Patient Instructions (Signed)
 [facility] [place] - A DEPT OF [facility]  Discharge Instructions: Thank you for choosing [facility] to provide your oncology and hematology care.  If you have a lab appointment with the Cancer Center - please note that after April 8th, 2024, all labs will be drawn in the cancer center.  You do not have to check in or register with the main entrance as you have in the past but will complete your check-in in the cancer center.  Wear comfortable clothing and clothing appropriate for easy access to any Portacath or PICC line.   We strive to give you quality time with your provider. You may need to reschedule your appointment if you arrive late (15 or more minutes).  Arriving late affects you and other patients whose appointments are after yours.  Also, if you miss three or more appointments without notifying the office, you may be dismissed from the clinic at the provider's discretion.      For prescription refill requests, have your pharmacy contact our office and allow 72 hours for refills to be completed.    Today you received the following chemotherapy and/or immunotherapy agents opdivo.        To help prevent nausea and vomiting after your treatment, we encourage you to take your nausea medication as directed.  BELOW ARE SYMPTOMS THAT SHOULD BE REPORTED IMMEDIATELY: *FEVER GREATER THAN 100.4 F (38 C) OR HIGHER *CHILLS OR SWEATING *NAUSEA AND VOMITING THAT IS NOT CONTROLLED WITH YOUR NAUSEA MEDICATION *UNUSUAL SHORTNESS OF BREATH *UNUSUAL BRUISING OR BLEEDING *URINARY PROBLEMS (pain or burning when urinating, or frequent urination) *BOWEL PROBLEMS (unusual diarrhea, constipation, pain near the anus) TENDERNESS IN MOUTH AND THROAT WITH OR WITHOUT PRESENCE OF ULCERS (sore throat, sores in mouth, or a toothache) UNUSUAL RASH, SWELLING OR PAIN  UNUSUAL VAGINAL DISCHARGE OR ITCHING   Items with * indicate a potential emergency and should be followed  up as soon as possible or go to the Emergency Department if any problems should occur.  Please show the CHEMOTHERAPY ALERT CARD or IMMUNOTHERAPY ALERT CARD at check-in to the Emergency Department and triage nurse.  Should you have questions after your visit or need to cancel or reschedule your appointment, please contact [facility] [place] - A DEPT OF [facility] [phone]  and follow the prompts.  Office hours are 8:00 a.m. to 4:30 p.m. Monday - Friday. Please note that voicemails left after 4:00 p.m. may not be returned until the following business day.  We are closed weekends and major holidays. You have access to a nurse at all times for urgent questions. Please call the main number to the clinic [phone] and follow the prompts.  For any non-urgent questions, you may also contact your provider using MyChart. We now offer e-Visits for anyone 79 and older to request care online for non-urgent symptoms. For details visit [email or web address].   Also download the MyChart app! Go to the app store, search "MyChart", open the app, select [place], and log in with your MyChart username and password.

## 2024-05-29 NOTE — Patient Instructions (Signed)

## 2024-05-29 NOTE — Progress Notes (Signed)
 Patient tolerated chemotherapy with no complaints voiced.  Side effects with management reviewed with understanding verbalized.  Port site clean and dry with no bruising or swelling noted at site.  Good blood return noted before and after administration of chemotherapy.  Band aid applied.  Patient left in satisfactory condition with VSS and no s/s of distress noted. All follow ups as scheduled.   Jessica Taylor [employer]

## 2024-05-29 NOTE — Progress Notes (Unsigned)
 POST OPERATIVE OFFICE NOTE    CC:  F/u for surgery  HPI:  This is a 83 y.o. female who is s/p left brachiocephalic fistula creation by Dr. Gretta on 03/01/2024.  She denies any steal symptoms in the left hand.  She believes her incision is well-healed.  She follows regularly with her nephrologist every couple months.  She is not yet on hemodialysis.  Allergies  Allergen Reactions   Crab [Shellfish Allergy] Nausea And Vomiting    Throws up violently   Ezetimibe Diarrhea   Keflex  [Cephalexin ]     Caused sores   Other Nausea And Vomiting    Shrimp/ Fried food   Penicillins Diarrhea   Shrimp Extract     Other Reaction(s): GI Intolerance    Current Outpatient Medications  Medication Sig Dispense Refill   amLODipine  (NORVASC ) 5 MG tablet TAKE ONE TABLET BY MOUTH EVERY DAY 30 tablet 2   anastrozole  (ARIMIDEX ) 1 MG tablet Take 1 tablet (1 mg total) by mouth daily. 90 tablet 3   atorvastatin  (LIPITOR ) 40 MG tablet TAKE (1) TABLET BY MOUTH ONCE DAILY. 90 tablet 2   clopidogrel  (PLAVIX ) 75 MG tablet Take 1 tablet (75 mg total) by mouth daily. 90 tablet 3   esomeprazole  (NEXIUM ) 20 MG capsule Take 20 mg by mouth daily.      Glycerin, PF, (BIOTRUE LUBRICANT) 0.5 % SOLN Place 1 drop into both eyes daily as needed (dry eye).     HYDROcodone -acetaminophen  (NORCO/VICODIN) 5-325 MG tablet Take 1 tablet by mouth every 6 (six) hours as needed for moderate pain (pain score 4-6). 10 tablet 0   hydrOXYzine  (ATARAX ) 25 MG tablet TAKE (1) TABLET BY MOUTH AT BEDTIME AS NEEDED 30 tablet 0   levothyroxine  (SYNTHROID ) 50 MCG tablet Take 0.5 tablets (25 mcg total) by mouth daily. 30 tablet 3   lidocaine -prilocaine  (EMLA ) cream Apply 1 Application topically.     metoprolol  tartrate (LOPRESSOR ) 25 MG tablet TAKE (1) TABLET BY MOUTH TWICE DAILY. 120 tablet 3   nitroGLYCERIN  (NITROSTAT ) 0.4 MG SL tablet DISSOLVE 1 TABLET SUBLINGUALLY AS NEEDED FOR CHEST PAIN, MAY REPEAT EVERY 5 MINUTES. AFTER 3 CALL 911. 25 tablet 4    nivolumab  (OPDIVO ) 100 MG/10ML SOLN chemo injection Inject 100 mg into the vein every 30 (thirty) days.     Vitamin D , Ergocalciferol , (DRISDOL ) 1.25 MG (50000 UNIT) CAPS capsule Take 1 capsule (50,000 Units total) by mouth once a week. 4 capsule 0   No current facility-administered medications for this visit.   Facility-Administered Medications Ordered in Other Visits  Medication Dose Route Frequency Provider Last Rate Last Admin   sodium chloride  flush (NS) 0.9 % injection 10 mL  10 mL Intracatheter PRN Rogers Hai, MD   10 mL at 11/26/22 1631   sodium chloride  flush (NS) 0.9 % injection 10 mL  10 mL Intracatheter PRN Rogers Hai, MD   10 mL at 03/02/23 1120     ROS:  See HPI  Physical Exam:  Vitals:   05/30/24 1440  BP: 114/65  Pulse: 60  SpO2: 95%  Weight: 156 lb (70.8 kg)  Height: 5' 3 (1.6 m)   Incision:  L arm incision well healed Extremities: Palpable left radial pulse; palpable thrill near the anastomosis and in the upper arm; fistula is difficult to feel in the mid upper arm  Assessment/Plan:  This is a 83 y.o. female who is s/p: L brachiocephalic fistula creation  Patent left brachiocephalic fistula with palpable thrill near the anastomosis and in  the upper arm.  Fistula is difficult to feel in the mid upper arm which is consistent with the duplex findings.  Ms. Speece is not yet on hemodialysis.  She will require a fistulogram prior to being able to cannulate fistula.  This can be performed when hemodialysis is imminent.  For now she will follow-up as needed.   Donnice Sender, PA-C [facility] [phone]

## 2024-05-30 ENCOUNTER — Ambulatory Visit

## 2024-05-30 ENCOUNTER — Other Ambulatory Visit: Payer: Self-pay

## 2024-05-30 ENCOUNTER — Ambulatory Visit (INDEPENDENT_AMBULATORY_CARE_PROVIDER_SITE_OTHER): Admitting: Physician Assistant

## 2024-05-30 VITALS — BP 114/65 | HR 60 | Ht 63.0 in | Wt 156.0 lb

## 2024-05-30 DIAGNOSIS — N186 End stage renal disease: Secondary | ICD-10-CM | POA: Diagnosis not present

## 2024-05-30 DIAGNOSIS — N184 Chronic kidney disease, stage 4 (severe): Secondary | ICD-10-CM

## 2024-05-30 LAB — T4: T4, Total: 6.8 ug/dL (ref 4.5–12.0)

## 2024-06-19 ENCOUNTER — Other Ambulatory Visit: Payer: Self-pay | Admitting: Hematology

## 2024-06-26 ENCOUNTER — Inpatient Hospital Stay: Admitting: Hematology

## 2024-06-26 ENCOUNTER — Inpatient Hospital Stay

## 2024-06-27 ENCOUNTER — Other Ambulatory Visit: Payer: Self-pay | Admitting: Hematology

## 2024-07-03 ENCOUNTER — Other Ambulatory Visit: Payer: Self-pay | Admitting: Cardiology

## 2024-07-07 ENCOUNTER — Other Ambulatory Visit: Payer: Self-pay | Admitting: *Deleted

## 2024-07-20 ENCOUNTER — Other Ambulatory Visit: Payer: Self-pay | Admitting: *Deleted

## 2024-07-20 MED ORDER — HYDROXYZINE HCL 25 MG PO TABS: ORAL_TABLET | ORAL | 0 refills | Status: AC

## 2024-07-28 ENCOUNTER — Other Ambulatory Visit: Payer: Self-pay | Admitting: Cardiology

## 2024-07-31 ENCOUNTER — Other Ambulatory Visit: Payer: Self-pay | Admitting: *Deleted

## 2024-07-31 DIAGNOSIS — C182 Malignant neoplasm of ascending colon: Secondary | ICD-10-CM

## 2024-08-02 ENCOUNTER — Other Ambulatory Visit: Payer: Self-pay | Admitting: *Deleted

## 2024-08-02 MED ORDER — AMLODIPINE BESYLATE 5 MG PO TABS: 5.0000 mg | ORAL_TABLET | Freq: Every day | ORAL | 2 refills | Status: DC

## 2024-08-14 DIAGNOSIS — N189 Chronic kidney disease, unspecified: Secondary | ICD-10-CM | POA: Diagnosis not present

## 2024-08-14 DIAGNOSIS — D631 Anemia in chronic kidney disease: Secondary | ICD-10-CM | POA: Diagnosis not present

## 2024-08-14 DIAGNOSIS — R809 Proteinuria, unspecified: Secondary | ICD-10-CM | POA: Diagnosis not present

## 2024-08-14 DIAGNOSIS — E559 Vitamin D deficiency, unspecified: Secondary | ICD-10-CM | POA: Diagnosis not present

## 2024-08-14 DIAGNOSIS — E211 Secondary hyperparathyroidism, not elsewhere classified: Secondary | ICD-10-CM | POA: Diagnosis not present

## 2024-08-22 DIAGNOSIS — R809 Proteinuria, unspecified: Secondary | ICD-10-CM | POA: Diagnosis not present

## 2024-08-22 DIAGNOSIS — I129 Hypertensive chronic kidney disease with stage 1 through stage 4 chronic kidney disease, or unspecified chronic kidney disease: Secondary | ICD-10-CM | POA: Diagnosis not present

## 2024-08-22 DIAGNOSIS — N184 Chronic kidney disease, stage 4 (severe): Secondary | ICD-10-CM | POA: Diagnosis not present

## 2024-08-22 DIAGNOSIS — I5032 Chronic diastolic (congestive) heart failure: Secondary | ICD-10-CM | POA: Diagnosis not present

## 2024-09-18 ENCOUNTER — Inpatient Hospital Stay: Attending: Hematology

## 2024-09-18 ENCOUNTER — Ambulatory Visit (HOSPITAL_COMMUNITY)
Admission: RE | Admit: 2024-09-18 | Discharge: 2024-09-18 | Disposition: A | Discharge status: Home / Self Care | Source: Ambulatory Visit | Attending: Hematology | Admitting: Hematology

## 2024-09-18 DIAGNOSIS — C50911 Malignant neoplasm of unspecified site of right female breast: Secondary | ICD-10-CM | POA: Insufficient documentation

## 2024-09-18 DIAGNOSIS — Z79899 Other long term (current) drug therapy: Secondary | ICD-10-CM | POA: Diagnosis not present

## 2024-09-18 DIAGNOSIS — C773 Secondary and unspecified malignant neoplasm of axilla and upper limb lymph nodes: Secondary | ICD-10-CM | POA: Insufficient documentation

## 2024-09-18 DIAGNOSIS — C642 Malignant neoplasm of left kidney, except renal pelvis: Secondary | ICD-10-CM | POA: Insufficient documentation

## 2024-09-18 DIAGNOSIS — C182 Malignant neoplasm of ascending colon: Secondary | ICD-10-CM | POA: Diagnosis present

## 2024-09-18 DIAGNOSIS — Z853 Personal history of malignant neoplasm of breast: Secondary | ICD-10-CM | POA: Diagnosis not present

## 2024-09-18 DIAGNOSIS — Z79811 Long term (current) use of aromatase inhibitors: Secondary | ICD-10-CM | POA: Diagnosis not present

## 2024-09-18 DIAGNOSIS — Z8551 Personal history of malignant neoplasm of bladder: Secondary | ICD-10-CM | POA: Insufficient documentation

## 2024-09-18 LAB — CBC WITH DIFFERENTIAL/PLATELET
Abs Immature Granulocytes: 0.01 K/uL (ref 0.00–0.07)
Basophils Absolute: 0.1 K/uL (ref 0.0–0.1)
Basophils Relative: 1 %
Eosinophils Absolute: 0.2 K/uL (ref 0.0–0.5)
Eosinophils Relative: 4 %
HCT: 39.8 % (ref 36.0–46.0)
Hemoglobin: 12.7 g/dL (ref 12.0–15.0)
Immature Granulocytes: 0 %
Lymphocytes Relative: 15 %
Lymphs Abs: 0.8 K/uL (ref 0.7–4.0)
MCH: 28.9 pg (ref 26.0–34.0)
MCHC: 31.9 g/dL (ref 30.0–36.0)
MCV: 90.7 fL (ref 80.0–100.0)
Monocytes Absolute: 0.4 K/uL (ref 0.1–1.0)
Monocytes Relative: 7 %
Neutro Abs: 4.1 K/uL (ref 1.7–7.7)
Neutrophils Relative %: 73 %
Platelets: 165 K/uL (ref 150–400)
RBC: 4.39 MIL/uL (ref 3.87–5.11)
RDW: 14.1 % (ref 11.5–15.5)
WBC: 5.6 K/uL (ref 4.0–10.5)
nRBC: 0 % (ref 0.0–0.2)

## 2024-09-18 LAB — COMPREHENSIVE METABOLIC PANEL WITH GFR
ALT: 15 U/L (ref 0–44)
AST: 21 U/L (ref 15–41)
Albumin: 4.4 g/dL (ref 3.5–5.0)
Alkaline Phosphatase: 81 U/L (ref 38–126)
Anion gap: 13 (ref 5–15)
BUN: 28 mg/dL — ABNORMAL HIGH (ref 8–23)
CO2: 21 mmol/L — ABNORMAL LOW (ref 22–32)
Calcium: 9.9 mg/dL (ref 8.9–10.3)
Chloride: 106 mmol/L (ref 98–111)
Creatinine, Ser: 2.24 mg/dL — ABNORMAL HIGH (ref 0.44–1.00)
GFR, Estimated: 21 mL/min — ABNORMAL LOW (ref >60)
Glucose, Bld: 104 mg/dL — ABNORMAL HIGH (ref 70–99)
Potassium: 4.3 mmol/L (ref 3.5–5.1)
Sodium: 139 mmol/L (ref 135–145)
Total Bilirubin: 0.4 mg/dL (ref 0.0–1.2)
Total Protein: 6.9 g/dL (ref 6.5–8.1)

## 2024-09-18 LAB — MAGNESIUM: Magnesium: 1.9 mg/dL (ref 1.7–2.4)

## 2024-09-18 LAB — TSH: TSH: 3.32 u[IU]/mL (ref 0.350–4.500)

## 2024-09-18 LAB — VITAMIN D 25 HYDROXY (VIT D DEFICIENCY, FRACTURES): Vit D, 25-Hydroxy: 65.66 ng/mL (ref 30–100)

## 2024-09-18 MED ORDER — IOHEXOL 9 MG/ML PO SOLN
500.0000 mL | ORAL | Status: AC
  Administered 2024-09-18: 500 mL via ORAL

## 2024-09-18 NOTE — Progress Notes (Signed)
 Jessica Taylor presented for Portacath access and flush.  Portacath located left chest wall accessed with  H 20 needle.  Good blood return present. Portacath flushed with 20mL NS. Patient remains accessed for CT scan.  Procedure tolerated well and without incident.     Discharged from clinic ambulatory in stable condition. Alert and oriented x 3. F/U with [facility] as scheduled.

## 2024-09-19 LAB — CEA: CEA: 11.5 ng/mL — ABNORMAL HIGH (ref 0.0–4.7)

## 2024-09-23 ENCOUNTER — Other Ambulatory Visit: Payer: Self-pay | Admitting: Cardiology

## 2024-09-25 ENCOUNTER — Other Ambulatory Visit (HOSPITAL_COMMUNITY)

## 2024-10-02 ENCOUNTER — Inpatient Hospital Stay: Attending: Hematology | Admitting: Oncology

## 2024-10-02 VITALS — BP 130/71 | HR 68 | Temp 97.4°F | Resp 16 | Wt 151.2 lb

## 2024-10-02 DIAGNOSIS — R911 Solitary pulmonary nodule: Secondary | ICD-10-CM | POA: Insufficient documentation

## 2024-10-02 DIAGNOSIS — C50911 Malignant neoplasm of unspecified site of right female breast: Secondary | ICD-10-CM | POA: Diagnosis not present

## 2024-10-02 DIAGNOSIS — Z1732 Human epidermal growth factor receptor 2 negative status: Secondary | ICD-10-CM | POA: Insufficient documentation

## 2024-10-02 DIAGNOSIS — E039 Hypothyroidism, unspecified: Secondary | ICD-10-CM | POA: Insufficient documentation

## 2024-10-02 DIAGNOSIS — C182 Malignant neoplasm of ascending colon: Secondary | ICD-10-CM | POA: Diagnosis present

## 2024-10-02 DIAGNOSIS — Z79811 Long term (current) use of aromatase inhibitors: Secondary | ICD-10-CM | POA: Diagnosis not present

## 2024-10-02 DIAGNOSIS — C679 Malignant neoplasm of bladder, unspecified: Secondary | ICD-10-CM | POA: Insufficient documentation

## 2024-10-02 DIAGNOSIS — Z1721 Progesterone receptor positive status: Secondary | ICD-10-CM | POA: Insufficient documentation

## 2024-10-02 DIAGNOSIS — Z170 Estrogen receptor positive status [ER+]: Secondary | ICD-10-CM | POA: Diagnosis not present

## 2024-10-02 DIAGNOSIS — C642 Malignant neoplasm of left kidney, except renal pelvis: Secondary | ICD-10-CM | POA: Diagnosis not present

## 2024-10-02 DIAGNOSIS — C773 Secondary and unspecified malignant neoplasm of axilla and upper limb lymph nodes: Secondary | ICD-10-CM | POA: Insufficient documentation

## 2024-10-02 NOTE — Patient Instructions (Addendum)
 [facility] at [facility] Discharge Instructions   You were seen and examined today by Dr. Davonna.  She reviewed the results of your lab work which are normal/stable.   She reviewed the results of your CT scan which did not show any evidence of cancer.   We will see you back in 4 months.  We will repeat lab work and a CT scan prior to this visit.   Return as scheduled.    Thank you for choosing [facility] at [facility] to provide your oncology and hematology care.  To afford each patient quality time with our provider, please arrive at least 15 minutes before your scheduled appointment time.   If you have a lab appointment with the Cancer Center please come in thru the Main Entrance and check in at the main information desk.  You need to re-schedule your appointment should you arrive 10 or more minutes late.  We strive to give you quality time with our providers, and arriving late affects you and other patients whose appointments are after yours.  Also, if you no show three or more times for appointments you may be dismissed from the clinic at the providers discretion.     Again, thank you for choosing [facility].  Our hope is that these requests will decrease the amount of time that you wait before being seen by our physicians.       _____________________________________________________________  Should you have questions after your visit to [facility], please contact our office at [phone] and follow the prompts.  Our office hours are 8:00 a.m. and 4:30 p.m. Monday - Friday.  Please note that voicemails left after 4:00 p.m. may not be returned until the following business day.  We are closed weekends and major holidays.  You do have access to a nurse 24-7, just call the main number to the clinic [phone] and do not press any options, hold on the line and a nurse will answer the phone.    For prescription  refill requests, have your pharmacy contact our office and allow 72 hours.    Due to Covid, you will need to wear a mask upon entering the hospital. If you do not have a mask, a mask will be given to you at the Main Entrance upon arrival. For doctor visits, patients may have 1 support person age 21 or older with them. For treatment visits, patients can not have anyone with them due to social distancing guidelines and our immunocompromised population.

## 2024-10-02 NOTE — Progress Notes (Signed)
 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 female was transferred to my care after her prior physician has left.   ASSESSMENT & PLAN:   Assessment & Plan: Jessica Taylor  is a 83 y.o. female with metastatic colon carcinoma, right breast invasive lobular carcinoma and stage III high-grade urothelial carcinoma.  Assessment and Plan Assessment & Plan Stage IV colon cancer, currently off treatment, under surveillance Stage IV colon cancer, MSI-high diagnosed in 2020, currently off treatment. Extensive oncology history below Currently on surveillance  -We reviewed the recent CT scan findings together.  The lung nodule is more or less stable with very slight increase.  Will repeat a PET scan in 4 months. -Discussed the role of [employer]/CT DNA testing for colon cancer surveillance.  Considering patient has been in remission for several years, will consider doing CT DNA testing for monitoring.  Initiated Signatera testing every three months to monitor circulating tumor DNA. - If Signatera test is positive, can schedule scan closer to the date.  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 S/p lumpectomy.  Oncotype scoring not  available. Started anastrozole  in June 2021.  -Reports no complications from anastrozole . - Continue anastrozole  1 mg daily.  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  Extensive oncology history below. Completed 1 year of nivolumab   - Recent CT scan with no evidence of metastatic disease.  Will continue to monitor every 4 months.  Left lung pulmonary nodule Recent CT scan showed very slight increase in this.  - Will obtain a PET scan in 4 months for monitoring  Hypothyroidism TSH: 3.320  - Continue Synthroid  25 mcg daily    The patient understands the plans discussed today and is in agreement with them.  She knows to contact our office if she develops concerns prior to her next appointment.  30 minutes of total time was spent for this patient encounter, including preparation,review of records,  face-to-face counseling with the patient and coordination of care, physical exam, and documentation of the encounter.    Jessica Taylor,acting as a neurosurgeon for Mickiel Dry, MD.,have documented all relevant documentation on the behalf of Mickiel Dry, MD,as directed by  Mickiel Dry, MD while in the presence of Mickiel Dry, MD.  I, Mickiel Dry MD, have reviewed the above documentation for accuracy and completeness, and I agree with the above.    Mickiel Dry, MD  [facility] [place] - A DEPT OF [facility] [address] [place] [address] Dept: [phone] Dept Fax: [phone]   Orders Placed This Encounter  Procedures   NM PET Image Restage (PS) Skull Base to Thigh (  F-18 FDG)    Standing Status:   Future    Expected Date:   01/02/2025    Expiration Date:   10/02/2025    If indicated for the ordered procedure, I authorize the administration of a radiopharmaceutical per  Radiology protocol:   Yes    Preferred imaging location?:   [facility]   CBC with Differential    Standing Status:   Future    Expected Date:   01/23/2025    Expiration Date:   04/23/2025   Comprehensive metabolic panel    Standing Status:   Future    Expected Date:   01/23/2025    Expiration Date:   04/23/2025   CEA    Standing Status:   Future    Expected Date:   01/23/2025    Expiration Date:   04/23/2025   TSH    Standing Status:   Future    Expected Date:   01/23/2025    Expiration Date:   04/23/2025   Signatera    Select as applicable. If patient is on or planning to receive immunotherapy, select drug: Not on Immunotherapy If Other or Multiple, Write down drug name:  Do not Delete Below This Line   ==========Department Information========== ID: [id number] Department:[facility] [place] - A DEPT OF [facility] [address] [place] [address] Dept: [phone] Dept Fax: [phone]    Standing Status:   Future    Expected Date:   01/22/2025    Expiration Date:   10/02/2025    Jennell to follow up with patient for sample collection (mobile phleb, lab, or saliva)::   No    Surveillance Program draw frequency::   Every 3 Months    Surveillance Program draw count::   4    Cancer type::   Colon    Stage of diagnosis::   IV    History of recurrence?:   No    Current disease status::   No evidence of disease    Is the patient receiving or planning to receive immunotherapy?:   No    Patient status::   Outpatient    By placing this electronic order I confirm the testing ordered herein is medically necessary and this patient has been informed of the details of the genetic test(s) ordered, including the risks, benefits, and alternatives, and has consented to testing.:   Yes    What type of billing?:   [employer]   [employer]ater[employer]    Select as applicable. If patient is on or planning to receive immunotherapy, select drug: Not on  Immunotherapy If Other or Multiple, Write down drug name:  Do not Delete Below This Line   ==========Department Information========== ID: [id number] Department:[facility] [place] - A DEPT OF [facility] [address] [place] [address] Dept: [phone] Dept Fax: [phone]    Standing Status:   Future    Number of Occurrences:   1    Expected Date:   10/02/2024    Expiration Date:   10/02/2025    Jennell to follow up with patient for sample collection (mobile phleb, lab, or saliva)::   No    Surveillance Program draw frequency::   Every 3 Months    Surveillance Program draw count::   4    Cancer type::   Colon    Stage of diagnosis::   IV    History of  recurrence?:   No    Current disease status::   No evidence of disease    By placing this electronic order I confirm the testing ordered herein is medically necessary and this patient has been informed of the details of the genetic test(s) ordered, including the risks, benefits, and alternatives, and has consented to testing.:   Yes    What type of billing?:   [employer]     ONCOLOGY HISTORY:   I have reviewed her chart and materials related to her cancer extensively and collaborated history with the patient. 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 The mass was circumferential and was biopsied.  Pathology: Adenocarcinoma. Loss of MLH1 and PMS2 nuclear expression.  -08/08/2019: CT CAP: Approximately 4.8 cm in length circumferential mass in the ascending colon. Local tumor extension medially along the colon mesentery with paracolic tumor measuring up to 3.5 cm, and adjacent small paracolic lymph nodes adjacent to the  tumor. In the liver there is what appears to be a small and relatively chronically stable cyst in segment 4, and a tiny hypodense lesion in the right hepatic lobe which is technically too small to characterize although statistically likely to be benign. No specific findings of distant metastatic spread. -08/09/2019: CEA 39.5 -10/11/2019: Right hemicolectomy.  Pathology: Invasive poorly differentiated adenocarcinoma, 6.1 cm, involving proximal ascending colon. Carcinoma invades into pericolonic soft tissue. Resection margins are negative for carcinoma. Lymphovascular invasion is present. Fifteen lymph nodes, negative for carcinoma (0/15). Two tumor deposits. Separate tubular adenoma. Staging: pT3, pN1c. Loss of MLH1 and PMS2 nuclear expression. MSI-High -10/23/2019: CEA 15.4  -11/13/2019: Initial PET: RIGHT hemicolectomy without evidence of metastatic colorectal carcinoma within the abdomen pelvis. No evidence of thoracic colon cancer metastasis. Single small hypermetabolic high RIGHT axillary lymph node is indeterminate. Would not favor metastatic colorectal carcinoma. Differential would include inflammatory lymph node (favored) versus secondary malignancy. -12/05/2019: Right axillary lymph node biopsy.  Pathology: Metastatic carcinoma, consistent with patient's clinical history of primary colorectal carcinoma.  -04/02/2020: Right axillary lymph node excision.  Pathology: Metastatic carcinoma consistent with patient's clinical history of primary colorectal carcinoma.  -05/02/2020: PET: No evidence of colon cancer recurrence or metastatic disease in the abdomen or pelvis. Interval postsurgical changes in both breasts with increased hypermetabolic nodal activity in the right axilla.  -05/14/2020-08/27/2022: Keytruda  . Held because she was started on opdivo  adjuvantly for urothelial carcinoma -08/27/2022-05/29/2024: Nivolumab  every 4 weeks, discontinued due to no disease recurrence for 4  years -06/2024-current: Patient under active surveillance with scans and CEA levels, which have been stable   Diagnosis: Stage I right breast invasive lobular carcinoma, grade 2   -11/28/2019: Bilateral Diagnostic Mammogram and US : Tender 1.2 cm hypoechoic mass in the medial right axilla. This is favored to be a pathologic lymph node. No suspicious findings identified within the breast parenchyma in either breast. -12/25/2019: Bilateral Breast MRI: 2 cm enhancing mass anterior to the upper right pectoralis muscle consistent with the patient's biopsy-proven site of malignancy. Suspicious 1 cm enhancing mass in the subareolar right breast. Indeterminate 5 mm enhancing focus in the posterior central left breast. -01/02/2020: Biopsies of right and left breast masses.  Pathology: Right breast positive for mammary carcinoma in situ, lobular type, with focus suspicious for invasion. Left breast negative for carcinoma.  -04/02/2020:  Right breast lumpectomy.  Pathology: Invasive lobular carcinoma, grade 2, 1.2 cm. Resection margins are negative for carcinoma. Negative for lymphovascular invasion. Two of two sentinel lymph nodes negative metastatic carcinoma (0/2). Tumor cella are ER positive (100%), PR positive (100%), and Her2 negative (1+). Ki-67 10%. Staging: pT1c, pN0  -04/2020-current: Anastrozole  -09/28/2023: Bilateral Diagnostic Mammogram: New suspicious 3.5 cm linear oriented calcifications near the lumpectomy site in the retroareolar right breast. -10/07/2023: Right breast biopsy.  Pathology: Benign breast tissue, negative for malignancy.    Diagnosis: Stage III (PT3PN0) left renal pelvis high-grade urothelial carcinoma   -02/13/2022: CT Renal Stone Study: Mild to moderate left hydronephrosis with hyperdense material in the left renal collecting system. There is also a small hyperdense focus near the left ureterovesical junction. Findings are concerning for blood products and hematoma formation.  Cannot exclude a lesion near the left ureterovesical junction. -03/20/2022: CT Renal Stone Study: Suspected 1.6 cm partially calcified left upper pole renal lesion, raising concern for urothelial carcinoma. Associated hyperdense filling defect/hemorrhage in the left renal collecting system extending to the mid ureter. -05/05/2022: Left renal pelvic biopsy.  Pathology: High-grade urothelial carcinoma.  -07/09/2022: Left radical nephroureterectomy, lymphadenectomy, and bladder cuff excision.  Pathology: Invasive papillary urothelial carcinoma, high grade. Tumor measures 3.9 cm in greatest dimension. Tumor invades into renal parenchyma. No urothelial carcinoma in situ identified. Margins not involves. One lymph node negative for metastatic carcinoma (0/1). Bladder cuff negative for malignancy. Staging: pT3, pN0 -She did not receive neoadjuvant chemotherapy and was not a candidate for cisplatin due to poor renal function.  -08/06/2022: PET: NED -08/27/2022-05/29/2024: Adjuvant Nivolumab   -06/2024-current: Patient remains under active surveillance with scan showing NED   Diagnosis: Left Lung pulmonary nodule  -05/28/2022: CT CAP: Unchanged 0.4 cm nodule of the dependent superior segment left lower lobe, stable and benign. -05/21/2023: CT CAP:  Enlarging LEFT lobe pulmonary nodule is concerning for primary lung cancer versus metastatic nodule, measures 7 mm. -11/09/2023: CT CAP: 9 mm subpleural nodule in the left lower lobe, mildly progressive, concerning for metastatic disease. -03/23/2024-current: Scans showing stable pulmonary nodule   Current Treatment:  Surveillance  INTERVAL HISTORY:   Discussed the use of AI scribe software for clinical note transcription with the patient, who gave verbal consent to proceed.  History of Present Illness Jessica Taylor is an 83 year old female with a history of multiple cancers who presents for follow-up and to establish care with me.  She has a history  of colon cancer, initially diagnosed in 2020, and has been undergoing treatment every three to four weeks with immunotherapy for several years with good response. Recently, she stopped the treatment and reports feeling 'pretty good' with more energy since discontinuation.  In July 2023, a small lung spot was identified, which has grown slightly over the past eight to nine months but remains less than a centimeter. She is concerned about potential growth now that she is not undergoing treatment.  She has a history of breast cancer, which she describes as 'the most painful' experience, particularly the preparation process. She is currently taking anastrozole , a hormone blocker, and has one more year left in the five-year course.  She is under the care of a nephrologist named Lutoni.  No symptoms of tiredness, blood in stool, abdominal pain, or weight loss. Her appetite is good. She quit smoking several years ago after her first heart attack.   I have reviewed the past medical history, past surgical history, social history and family history with the  patient and they are unchanged from previous note.  ALLERGIES:  is allergic to crab [shellfish allergy], ezetimibe, keflex  [cephalexin ], other, penicillins, and shrimp extract.  MEDICATIONS:  Current Outpatient Medications  Medication Sig Dispense Refill   amLODipine  (NORVASC ) 5 MG tablet Take 1 tablet (5 mg total) by mouth daily. 30 tablet 2   anastrozole  (ARIMIDEX ) 1 MG tablet Take 1 tablet (1 mg total) by mouth daily. 90 tablet 3   atorvastatin  (LIPITOR ) 40 MG tablet TAKE (1) TABLET BY MOUTH ONCE DAILY. 90 tablet 0   clopidogrel  (PLAVIX ) 75 MG tablet Take 1 tablet (75 mg total) by mouth daily. 30 tablet 0   esomeprazole  (NEXIUM ) 20 MG capsule Take 20 mg by mouth daily.      Glycerin, PF, (BIOTRUE LUBRICANT) 0.5 % SOLN Place 1 drop into both eyes daily as needed (dry eye).     HYDROcodone -acetaminophen  (NORCO/VICODIN) 5-325 MG tablet Take 1 tablet  by mouth every 6 (six) hours as needed for moderate pain (pain score 4-6). 10 tablet 0   hydrOXYzine  (ATARAX ) 25 MG tablet TAKE (1) TABLET BY MOUTH AT BEDTIME AS NEEDED 30 tablet 0   levothyroxine  (SYNTHROID ) 50 MCG tablet Take 0.5 tablets (25 mcg total) by mouth daily. 30 tablet 3   lidocaine -prilocaine  (EMLA ) cream Apply 1 Application topically.     metoprolol  tartrate (LOPRESSOR ) 25 MG tablet TAKE (1) TABLET BY MOUTH TWICE DAILY. 120 tablet 0   nitroGLYCERIN  (NITROSTAT ) 0.4 MG SL tablet DISSOLVE 1 TABLET SUBLINGUALLY AS NEEDED FOR CHEST PAIN, MAY REPEAT EVERY 5 MINUTES. AFTER 3 CALL 911. 25 tablet 4   nivolumab  (OPDIVO ) 100 MG/10ML SOLN chemo injection Inject 100 mg into the vein every 30 (thirty) days.     No current facility-administered medications for this visit.   Facility-Administered Medications Ordered in Other Visits  Medication Dose Route Frequency Provider Last Rate Last Admin   sodium chloride  flush (NS) 0.9 % injection 10 mL  10 mL Intracatheter PRN Rogers Hai, MD   10 mL at 11/26/22 1631   sodium chloride  flush (NS) 0.9 % injection 10 mL  10 mL Intracatheter PRN Rogers Hai, MD   10 mL at 03/02/23 1120    REVIEW OF SYSTEMS:   Constitutional: Denies fevers, chills or abnormal weight loss Eyes: Denies blurriness of vision Ears, nose, mouth, throat, and face: Denies mucositis or sore throat Respiratory: Denies cough, dyspnea or wheezes Cardiovascular: Denies palpitation, chest discomfort or lower extremity swelling Gastrointestinal:  Denies nausea, heartburn or change in bowel habits Skin: Denies abnormal skin rashes Lymphatics: Denies new lymphadenopathy or easy bruising Neurological:Denies numbness, tingling or new weaknesses Behavioral/Psych: Mood is stable, no new changes  All other systems were reviewed with the patient and are negative.   VITALS:  Blood pressure 130/71, pulse 68, temperature (!) 97.4 F (36.3 C), temperature source Oral, resp.  rate 16, weight 151 lb 3.2 oz (68.6 kg), SpO2 97%.  Wt Readings from Last 3 Encounters:  10/02/24 151 lb 3.2 oz (68.6 kg)  05/30/24 156 lb (70.8 kg)  05/29/24 155 lb 13.8 oz (70.7 kg)    Body mass index is 26.78 kg/m.  Performance status (ECOG): 1 - Symptomatic but completely ambulatory  PHYSICAL EXAM:   GENERAL:alert, no distress and comfortable SKIN: skin color, texture, turgor are normal, no rashes or significant lesions EYES: normal, Conjunctiva are pink and non-injected, sclera clear LUNGS: clear to auscultation and percussion with normal breathing effort HEART: regular rate & rhythm and no murmurs and no lower extremity edema ABDOMEN:abdomen  soft, non-tender and normal bowel sounds Musculoskeletal:no cyanosis of digits and no clubbing  NEURO: alert & oriented x 3 with fluent speech  LABORATORY DATA:  I have reviewed the data as listed   Lab Results  Component Value Date   WBC 5.6 09/18/2024   NEUTROABS 4.1 09/18/2024   HGB 12.7 09/18/2024   HCT 39.8 09/18/2024   MCV 90.7 09/18/2024   PLT 165 09/18/2024      Chemistry      Component Value Date/Time   NA 139 09/18/2024 1424   NA 140 09/08/2019 1420   K 4.3 09/18/2024 1424   CL 106 09/18/2024 1424   CO2 21 (L) 09/18/2024 1424   BUN 28 (H) 09/18/2024 1424   BUN 19 09/08/2019 1420   CREATININE 2.24 (H) 09/18/2024 1424   CREATININE 1.36 (H) 02/14/2016 1626      Component Value Date/Time   CALCIUM  9.9 09/18/2024 1424   ALKPHOS 81 09/18/2024 1424   AST 21 09/18/2024 1424   ALT 15 09/18/2024 1424   BILITOT 0.4 09/18/2024 1424       Latest Reference Range & Units 09/18/24 14:24  TSH 0.350 - 4.500 uIU/mL 3.320    Latest Reference Range & Units 09/18/24 14:24  CEA 0.0 - 4.7 ng/mL 11.5 (H)  (H): Data is abnormally high  RADIOGRAPHIC STUDIES: I have personally reviewed the radiological images as listed and agreed with the findings in the report.  CT CHEST ABDOMEN PELVIS WO CONTRAST EXAM: CT CHEST, ABDOMEN  AND PELVIS WITHOUT CONTRAST 09/18/2024 04:29:45 PM  TECHNIQUE: CT of the chest, abdomen and pelvis was performed without the administration of intravenous contrast. Multiplanar reformatted images are provided for review. Automated exposure control, iterative reconstruction, and/or weight based adjustment of the mA/kV was utilized to reduce the radiation dose to as low as reasonably achievable.  COMPARISON: CT Chest Abdomen Pelvis 03/23/2024.  CLINICAL HISTORY: Evaluation of metastatic disease. Right breast lobular carcinoma. *tracking code: Bo*  FINDINGS:  CHEST:  MEDIASTINUM AND LYMPH NODES: Status post coronary artery bypass grafting. Global cardiac size within normal limits. No pericardial effusion. Central pulmonary arteries are of normal caliber. Extensive atherosclerotic calcification within the thoracic aorta. No aortic aneurysm. Left subclavian chest port tip seen within the superior vena cava. The central airways are clear. No pathologic thoracic adenopathy. Visualized thyroid  is unremarkable. The esophagus is unremarkable. Small hiatal hernia.  LUNGS AND PLEURA: Stable subpleural 9 mm left lower lobe pulmonary nodule, image 55/3. However, when compared to the remote prior examination of 02/08/2023 and 05/28/2022, the nodule demonstrates slow but continuous enlargement. Given this, an indolent etiology is favored. Stable mild subpleural pulmonary fibrotic change within the dependent lungs. No new focal pulmonary nodules or infiltrates. No pneumothorax or pleural effusion. Stable pleural thickening along the right lateral lung base at the interface with the major fissure.  ABDOMEN AND PELVIS:  LIVER: Stable cyst within the left hepatic lobe. Liver otherwise unremarkable.  GALLBLADDER AND BILE DUCTS: Gallbladder is unremarkable. No biliary ductal dilatation.  SPLEEN: No acute abnormality.  PANCREAS: No acute abnormality.  ADRENAL GLANDS: No acute  abnormality.  KIDNEYS, URETERS AND BLADDER: Status post left nephrectomy. Physical right kidney is unremarkable. No stones in the right kidney or ureter. No hydronephrosis. No perinephric or periureteral stranding. Urinary bladder is unremarkable.  GI AND BOWEL: Severe descending and sigmoid colonic diverticulosis without superimposed acute inflammatory change. Status post appendectomy. The stomach, small bowel, and large bowel are otherwise unremarkable.  REPRODUCTIVE ORGANS: No acute abnormality.  PERITONEUM  AND RETROPERITONEUM: No ascites. No free air.  VASCULATURE: Extensive aortoiliac atherosclerotic calcifications. Stable superimposed 3.5 cm fusiform infrarenal abdominal aortic aneurysm. Extensive calcified atherosclerotic plaque noted within the lower extremity arterial inflow with endovascular stenting of the common iliac arteries bilaterally noted.  ABDOMINAL AND PELVIS LYMPH NODES: No lymphadenopathy.  BONES AND SOFT TISSUES: Osseous structures are age appropriate. No acute bone abnormality. No lytic or blastic bone lesion. Stable postsurgical changes within the right breast. No focal soft tissue abnormality.  IMPRESSION: 1. No evidence of recurrent or residual disease related to the patient's underlying breast malignancy within the chest, abdomen, and pelvis. 2. Stable subpleural 9 mm left lower lobe pulmonary nodule but with slow interval growth since 05/28/2022 and 02/08/2023; indolent bronchogenic neoplasm is favored. Consider non-contrast chest CT at 3 months, PET/CT, or tissue sampling given size and interval growth. 3. Severe descending and sigmoid diverticulosis without acute inflammatory change. 4. Stable 3.5 cm fusiform infrarenal abdominal aortic aneurysm. Recommend ultrasound follow-up in 3 years for 3.03.9 cm AAA per Society for Vascular Surgery (2018) guidelines.  Electronically signed by: Dorethia Molt MD 09/20/2024 04:00 AM EDT RP Workstation:  [id number]    "

## 2024-10-04 ENCOUNTER — Other Ambulatory Visit: Payer: Self-pay

## 2024-10-05 ENCOUNTER — Encounter: Payer: Self-pay | Admitting: Oncology

## 2024-10-24 ENCOUNTER — Other Ambulatory Visit: Payer: Self-pay

## 2024-10-26 ENCOUNTER — Other Ambulatory Visit: Payer: Self-pay | Admitting: *Deleted

## 2024-10-26 MED ORDER — LEVOTHYROXINE SODIUM 50 MCG PO TABS: 25.0000 ug | ORAL_TABLET | Freq: Every day | ORAL | 3 refills | Status: AC

## 2024-11-14 ENCOUNTER — Encounter (HOSPITAL_COMMUNITY): Payer: Self-pay

## 2024-11-14 LAB — SIGNATERA
SIGNATERA MTM READOUT: 0 MTM/ml
SIGNATERA TEST RESULT: NEGATIVE

## 2024-11-20 ENCOUNTER — Encounter: Payer: Self-pay | Admitting: *Deleted

## 2024-11-29 ENCOUNTER — Other Ambulatory Visit: Payer: Self-pay

## 2024-11-29 ENCOUNTER — Other Ambulatory Visit: Payer: Self-pay | Admitting: Cardiology

## 2024-11-29 DIAGNOSIS — C50911 Malignant neoplasm of unspecified site of right female breast: Secondary | ICD-10-CM

## 2024-11-29 MED ORDER — ANASTROZOLE 1 MG PO TABS: 1.0000 mg | ORAL_TABLET | Freq: Every day | ORAL | 3 refills | Status: AC

## 2024-11-29 NOTE — Telephone Encounter (Signed)
 Refill sent for anastrozole  per Dr. Armanda orders and last note 10/02/2024- Continue anastrozole  1 mg daily. Plan to administer for 5 years.

## 2024-11-30 ENCOUNTER — Other Ambulatory Visit: Payer: Self-pay | Admitting: Oncology

## 2024-11-30 MED ORDER — METOPROLOL TARTRATE 25 MG PO TABS: ORAL_TABLET | ORAL | 0 refills | Status: AC

## 2024-11-30 MED ORDER — CLOPIDOGREL BISULFATE 75 MG PO TABS: 75.0000 mg | ORAL_TABLET | Freq: Every day | ORAL | 0 refills | Status: DC

## 2024-12-13 ENCOUNTER — Other Ambulatory Visit: Payer: Self-pay

## 2024-12-19 ENCOUNTER — Ambulatory Visit: Admitting: Physician Assistant

## 2024-12-20 ENCOUNTER — Other Ambulatory Visit: Payer: Self-pay

## 2024-12-20 MED ORDER — ATORVASTATIN CALCIUM 40 MG PO TABS: 40.0000 mg | ORAL_TABLET | Freq: Every day | ORAL | 0 refills | Status: AC

## 2024-12-20 NOTE — Telephone Encounter (Signed)
 In accordance with refill protocols, please review and address the following requirements before this medication refill can be authorized:  Labs

## 2024-12-28 ENCOUNTER — Other Ambulatory Visit: Payer: Self-pay | Admitting: Cardiology

## 2024-12-28 MED ORDER — CLOPIDOGREL BISULFATE 75 MG PO TABS: 75.0000 mg | ORAL_TABLET | Freq: Every day | ORAL | 0 refills | Status: AC

## 2025-01-25 ENCOUNTER — Other Ambulatory Visit (HOSPITAL_COMMUNITY)

## 2025-01-25 ENCOUNTER — Inpatient Hospital Stay: Attending: Hematology

## 2025-01-29 ENCOUNTER — Other Ambulatory Visit (HOSPITAL_COMMUNITY)

## 2025-01-29 ENCOUNTER — Inpatient Hospital Stay

## 2025-02-05 ENCOUNTER — Inpatient Hospital Stay

## 2025-02-05 ENCOUNTER — Inpatient Hospital Stay: Admitting: Oncology

## 2025-02-14 ENCOUNTER — Ambulatory Visit: Admitting: Emergency Medicine
